# Patient Record
Sex: Male | Born: 1945 | Race: White | Hispanic: No | Marital: Married | Smoking: Never smoker
Health system: Southern US, Community
[De-identification: ages and names within clinical notes are randomized; demographics above are authoritative.]

## PROBLEM LIST (undated history)

## (undated) DIAGNOSIS — C221 Intrahepatic bile duct carcinoma: Secondary | ICD-10-CM

## (undated) DIAGNOSIS — I10 Essential (primary) hypertension: Secondary | ICD-10-CM

## (undated) DIAGNOSIS — E119 Type 2 diabetes mellitus without complications: Secondary | ICD-10-CM

## (undated) DIAGNOSIS — M1990 Unspecified osteoarthritis, unspecified site: Secondary | ICD-10-CM

## (undated) DIAGNOSIS — K219 Gastro-esophageal reflux disease without esophagitis: Secondary | ICD-10-CM

## (undated) DIAGNOSIS — E785 Hyperlipidemia, unspecified: Secondary | ICD-10-CM

## (undated) HISTORY — DX: Unspecified osteoarthritis, unspecified site: M19.90

## (undated) HISTORY — DX: Intrahepatic bile duct carcinoma: C22.1

## (undated) HISTORY — DX: Hyperlipidemia, unspecified: E78.5

## (undated) HISTORY — DX: Essential (primary) hypertension: I10

## (undated) HISTORY — DX: Gastro-esophageal reflux disease without esophagitis: K21.9

## (undated) HISTORY — DX: Type 2 diabetes mellitus without complications: E11.9

## (undated) HISTORY — PX: COLONOSCOPY: SHX174

---

## 1978-11-20 HISTORY — PX: APPENDECTOMY: SHX54

## 2009-07-12 ENCOUNTER — Ambulatory Visit: Payer: Self-pay | Admitting: Internal Medicine

## 2009-07-12 DIAGNOSIS — M109 Gout, unspecified: Secondary | ICD-10-CM

## 2009-07-12 DIAGNOSIS — M129 Arthropathy, unspecified: Secondary | ICD-10-CM | POA: Insufficient documentation

## 2009-07-12 DIAGNOSIS — I10 Essential (primary) hypertension: Secondary | ICD-10-CM

## 2009-07-12 DIAGNOSIS — E785 Hyperlipidemia, unspecified: Secondary | ICD-10-CM | POA: Insufficient documentation

## 2009-07-16 ENCOUNTER — Ambulatory Visit: Payer: Self-pay | Admitting: Internal Medicine

## 2016-04-19 ENCOUNTER — Encounter: Payer: Self-pay | Admitting: Internal Medicine

## 2017-02-12 DIAGNOSIS — E119 Type 2 diabetes mellitus without complications: Secondary | ICD-10-CM | POA: Diagnosis not present

## 2017-02-12 DIAGNOSIS — M109 Gout, unspecified: Secondary | ICD-10-CM | POA: Diagnosis not present

## 2017-02-12 DIAGNOSIS — E669 Obesity, unspecified: Secondary | ICD-10-CM | POA: Diagnosis not present

## 2017-02-12 DIAGNOSIS — Z87898 Personal history of other specified conditions: Secondary | ICD-10-CM | POA: Diagnosis not present

## 2017-02-12 DIAGNOSIS — E785 Hyperlipidemia, unspecified: Secondary | ICD-10-CM | POA: Diagnosis not present

## 2017-02-12 DIAGNOSIS — I10 Essential (primary) hypertension: Secondary | ICD-10-CM | POA: Diagnosis not present

## 2017-02-12 DIAGNOSIS — H6121 Impacted cerumen, right ear: Secondary | ICD-10-CM | POA: Diagnosis not present

## 2017-04-25 DIAGNOSIS — S80861A Insect bite (nonvenomous), right lower leg, initial encounter: Secondary | ICD-10-CM | POA: Diagnosis not present

## 2017-04-25 DIAGNOSIS — A932 Colorado tick fever: Secondary | ICD-10-CM | POA: Diagnosis not present

## 2017-06-19 DIAGNOSIS — E119 Type 2 diabetes mellitus without complications: Secondary | ICD-10-CM | POA: Diagnosis not present

## 2017-06-19 DIAGNOSIS — Z79899 Other long term (current) drug therapy: Secondary | ICD-10-CM | POA: Diagnosis not present

## 2017-06-19 DIAGNOSIS — E559 Vitamin D deficiency, unspecified: Secondary | ICD-10-CM | POA: Diagnosis not present

## 2017-06-19 DIAGNOSIS — I10 Essential (primary) hypertension: Secondary | ICD-10-CM | POA: Diagnosis not present

## 2017-06-19 DIAGNOSIS — M109 Gout, unspecified: Secondary | ICD-10-CM | POA: Diagnosis not present

## 2017-06-19 DIAGNOSIS — A770 Spotted fever due to Rickettsia rickettsii: Secondary | ICD-10-CM | POA: Diagnosis not present

## 2017-06-19 DIAGNOSIS — E785 Hyperlipidemia, unspecified: Secondary | ICD-10-CM | POA: Diagnosis not present

## 2017-07-03 DIAGNOSIS — I10 Essential (primary) hypertension: Secondary | ICD-10-CM | POA: Diagnosis not present

## 2017-09-05 DIAGNOSIS — H9319 Tinnitus, unspecified ear: Secondary | ICD-10-CM | POA: Diagnosis not present

## 2017-09-05 DIAGNOSIS — Z79899 Other long term (current) drug therapy: Secondary | ICD-10-CM | POA: Diagnosis not present

## 2017-09-05 DIAGNOSIS — Z23 Encounter for immunization: Secondary | ICD-10-CM | POA: Diagnosis not present

## 2017-09-05 DIAGNOSIS — E559 Vitamin D deficiency, unspecified: Secondary | ICD-10-CM | POA: Diagnosis not present

## 2017-09-18 DIAGNOSIS — E559 Vitamin D deficiency, unspecified: Secondary | ICD-10-CM | POA: Diagnosis not present

## 2017-09-18 DIAGNOSIS — H9319 Tinnitus, unspecified ear: Secondary | ICD-10-CM | POA: Diagnosis not present

## 2017-09-18 DIAGNOSIS — D485 Neoplasm of uncertain behavior of skin: Secondary | ICD-10-CM | POA: Diagnosis not present

## 2017-09-18 DIAGNOSIS — L859 Epidermal thickening, unspecified: Secondary | ICD-10-CM | POA: Diagnosis not present

## 2017-10-09 DIAGNOSIS — H903 Sensorineural hearing loss, bilateral: Secondary | ICD-10-CM | POA: Diagnosis not present

## 2017-10-09 DIAGNOSIS — H9311 Tinnitus, right ear: Secondary | ICD-10-CM | POA: Diagnosis not present

## 2017-10-09 DIAGNOSIS — H918X1 Other specified hearing loss, right ear: Secondary | ICD-10-CM | POA: Diagnosis not present

## 2017-10-15 DIAGNOSIS — H918X1 Other specified hearing loss, right ear: Secondary | ICD-10-CM | POA: Diagnosis not present

## 2017-10-15 DIAGNOSIS — H903 Sensorineural hearing loss, bilateral: Secondary | ICD-10-CM | POA: Diagnosis not present

## 2017-10-19 DIAGNOSIS — I7090 Unspecified atherosclerosis: Secondary | ICD-10-CM | POA: Diagnosis not present

## 2017-10-19 DIAGNOSIS — J329 Chronic sinusitis, unspecified: Secondary | ICD-10-CM | POA: Diagnosis not present

## 2017-10-19 DIAGNOSIS — H9311 Tinnitus, right ear: Secondary | ICD-10-CM | POA: Diagnosis not present

## 2017-10-19 DIAGNOSIS — H918X1 Other specified hearing loss, right ear: Secondary | ICD-10-CM | POA: Diagnosis not present

## 2017-10-26 DIAGNOSIS — Z9181 History of falling: Secondary | ICD-10-CM | POA: Diagnosis not present

## 2017-10-26 DIAGNOSIS — I10 Essential (primary) hypertension: Secondary | ICD-10-CM | POA: Diagnosis not present

## 2017-10-26 DIAGNOSIS — Z125 Encounter for screening for malignant neoplasm of prostate: Secondary | ICD-10-CM | POA: Diagnosis not present

## 2017-10-26 DIAGNOSIS — Z0000 Encounter for general adult medical examination without abnormal findings: Secondary | ICD-10-CM | POA: Diagnosis not present

## 2017-10-26 DIAGNOSIS — E785 Hyperlipidemia, unspecified: Secondary | ICD-10-CM | POA: Diagnosis not present

## 2017-10-26 DIAGNOSIS — M109 Gout, unspecified: Secondary | ICD-10-CM | POA: Diagnosis not present

## 2017-10-26 DIAGNOSIS — E559 Vitamin D deficiency, unspecified: Secondary | ICD-10-CM | POA: Diagnosis not present

## 2017-10-26 DIAGNOSIS — E669 Obesity, unspecified: Secondary | ICD-10-CM | POA: Diagnosis not present

## 2017-10-26 DIAGNOSIS — Z1211 Encounter for screening for malignant neoplasm of colon: Secondary | ICD-10-CM | POA: Diagnosis not present

## 2017-10-26 DIAGNOSIS — E119 Type 2 diabetes mellitus without complications: Secondary | ICD-10-CM | POA: Diagnosis not present

## 2017-10-26 DIAGNOSIS — Z1331 Encounter for screening for depression: Secondary | ICD-10-CM | POA: Diagnosis not present

## 2017-11-09 DIAGNOSIS — I10 Essential (primary) hypertension: Secondary | ICD-10-CM | POA: Diagnosis not present

## 2017-11-09 DIAGNOSIS — N182 Chronic kidney disease, stage 2 (mild): Secondary | ICD-10-CM | POA: Diagnosis not present

## 2017-11-09 DIAGNOSIS — E669 Obesity, unspecified: Secondary | ICD-10-CM | POA: Diagnosis not present

## 2017-11-09 DIAGNOSIS — E1122 Type 2 diabetes mellitus with diabetic chronic kidney disease: Secondary | ICD-10-CM | POA: Diagnosis not present

## 2017-11-09 DIAGNOSIS — E559 Vitamin D deficiency, unspecified: Secondary | ICD-10-CM | POA: Diagnosis not present

## 2017-11-09 DIAGNOSIS — E785 Hyperlipidemia, unspecified: Secondary | ICD-10-CM | POA: Diagnosis not present

## 2017-11-09 DIAGNOSIS — I129 Hypertensive chronic kidney disease with stage 1 through stage 4 chronic kidney disease, or unspecified chronic kidney disease: Secondary | ICD-10-CM | POA: Diagnosis not present

## 2017-11-09 DIAGNOSIS — M109 Gout, unspecified: Secondary | ICD-10-CM | POA: Diagnosis not present

## 2017-11-09 DIAGNOSIS — R972 Elevated prostate specific antigen [PSA]: Secondary | ICD-10-CM | POA: Diagnosis not present

## 2017-11-22 DIAGNOSIS — R972 Elevated prostate specific antigen [PSA]: Secondary | ICD-10-CM | POA: Diagnosis not present

## 2017-11-22 DIAGNOSIS — N401 Enlarged prostate with lower urinary tract symptoms: Secondary | ICD-10-CM | POA: Diagnosis not present

## 2017-11-22 DIAGNOSIS — Z79899 Other long term (current) drug therapy: Secondary | ICD-10-CM | POA: Diagnosis not present

## 2018-01-03 DIAGNOSIS — R972 Elevated prostate specific antigen [PSA]: Secondary | ICD-10-CM | POA: Diagnosis not present

## 2018-01-03 DIAGNOSIS — N401 Enlarged prostate with lower urinary tract symptoms: Secondary | ICD-10-CM | POA: Diagnosis not present

## 2018-02-07 DIAGNOSIS — E663 Overweight: Secondary | ICD-10-CM | POA: Diagnosis not present

## 2018-02-07 DIAGNOSIS — Z125 Encounter for screening for malignant neoplasm of prostate: Secondary | ICD-10-CM | POA: Diagnosis not present

## 2018-02-07 DIAGNOSIS — E785 Hyperlipidemia, unspecified: Secondary | ICD-10-CM | POA: Diagnosis not present

## 2018-02-07 DIAGNOSIS — I10 Essential (primary) hypertension: Secondary | ICD-10-CM | POA: Diagnosis not present

## 2018-02-07 DIAGNOSIS — Z1211 Encounter for screening for malignant neoplasm of colon: Secondary | ICD-10-CM | POA: Diagnosis not present

## 2018-02-07 DIAGNOSIS — Z0000 Encounter for general adult medical examination without abnormal findings: Secondary | ICD-10-CM | POA: Diagnosis not present

## 2018-02-07 DIAGNOSIS — E559 Vitamin D deficiency, unspecified: Secondary | ICD-10-CM | POA: Diagnosis not present

## 2018-02-07 DIAGNOSIS — E119 Type 2 diabetes mellitus without complications: Secondary | ICD-10-CM | POA: Diagnosis not present

## 2018-02-07 DIAGNOSIS — Z1331 Encounter for screening for depression: Secondary | ICD-10-CM | POA: Diagnosis not present

## 2018-02-21 DIAGNOSIS — E785 Hyperlipidemia, unspecified: Secondary | ICD-10-CM | POA: Diagnosis not present

## 2018-02-21 DIAGNOSIS — R972 Elevated prostate specific antigen [PSA]: Secondary | ICD-10-CM | POA: Diagnosis not present

## 2018-02-21 DIAGNOSIS — I129 Hypertensive chronic kidney disease with stage 1 through stage 4 chronic kidney disease, or unspecified chronic kidney disease: Secondary | ICD-10-CM | POA: Diagnosis not present

## 2018-02-21 DIAGNOSIS — E559 Vitamin D deficiency, unspecified: Secondary | ICD-10-CM | POA: Diagnosis not present

## 2018-02-21 DIAGNOSIS — M109 Gout, unspecified: Secondary | ICD-10-CM | POA: Diagnosis not present

## 2018-02-21 DIAGNOSIS — E669 Obesity, unspecified: Secondary | ICD-10-CM | POA: Diagnosis not present

## 2018-02-21 DIAGNOSIS — I10 Essential (primary) hypertension: Secondary | ICD-10-CM | POA: Diagnosis not present

## 2018-02-21 DIAGNOSIS — N182 Chronic kidney disease, stage 2 (mild): Secondary | ICD-10-CM | POA: Diagnosis not present

## 2018-02-21 DIAGNOSIS — E1122 Type 2 diabetes mellitus with diabetic chronic kidney disease: Secondary | ICD-10-CM | POA: Diagnosis not present

## 2018-04-02 DIAGNOSIS — N401 Enlarged prostate with lower urinary tract symptoms: Secondary | ICD-10-CM | POA: Diagnosis not present

## 2018-04-02 DIAGNOSIS — R972 Elevated prostate specific antigen [PSA]: Secondary | ICD-10-CM | POA: Diagnosis not present

## 2018-05-30 DIAGNOSIS — Z1339 Encounter for screening examination for other mental health and behavioral disorders: Secondary | ICD-10-CM | POA: Diagnosis not present

## 2018-05-30 DIAGNOSIS — M109 Gout, unspecified: Secondary | ICD-10-CM | POA: Diagnosis not present

## 2018-05-30 DIAGNOSIS — E559 Vitamin D deficiency, unspecified: Secondary | ICD-10-CM | POA: Diagnosis not present

## 2018-05-30 DIAGNOSIS — E785 Hyperlipidemia, unspecified: Secondary | ICD-10-CM | POA: Diagnosis not present

## 2018-05-30 DIAGNOSIS — I10 Essential (primary) hypertension: Secondary | ICD-10-CM | POA: Diagnosis not present

## 2018-05-30 DIAGNOSIS — E1122 Type 2 diabetes mellitus with diabetic chronic kidney disease: Secondary | ICD-10-CM | POA: Diagnosis not present

## 2018-05-30 DIAGNOSIS — R972 Elevated prostate specific antigen [PSA]: Secondary | ICD-10-CM | POA: Diagnosis not present

## 2018-06-26 DIAGNOSIS — H524 Presbyopia: Secondary | ICD-10-CM | POA: Diagnosis not present

## 2018-06-26 DIAGNOSIS — H40023 Open angle with borderline findings, high risk, bilateral: Secondary | ICD-10-CM | POA: Diagnosis not present

## 2018-07-03 DIAGNOSIS — R972 Elevated prostate specific antigen [PSA]: Secondary | ICD-10-CM | POA: Diagnosis not present

## 2018-07-03 DIAGNOSIS — N401 Enlarged prostate with lower urinary tract symptoms: Secondary | ICD-10-CM | POA: Diagnosis not present

## 2018-09-05 DIAGNOSIS — Z139 Encounter for screening, unspecified: Secondary | ICD-10-CM | POA: Diagnosis not present

## 2018-09-05 DIAGNOSIS — E559 Vitamin D deficiency, unspecified: Secondary | ICD-10-CM | POA: Diagnosis not present

## 2018-09-05 DIAGNOSIS — Z23 Encounter for immunization: Secondary | ICD-10-CM | POA: Diagnosis not present

## 2018-09-05 DIAGNOSIS — E785 Hyperlipidemia, unspecified: Secondary | ICD-10-CM | POA: Diagnosis not present

## 2018-09-05 DIAGNOSIS — M109 Gout, unspecified: Secondary | ICD-10-CM | POA: Diagnosis not present

## 2018-09-05 DIAGNOSIS — R972 Elevated prostate specific antigen [PSA]: Secondary | ICD-10-CM | POA: Diagnosis not present

## 2018-09-05 DIAGNOSIS — I10 Essential (primary) hypertension: Secondary | ICD-10-CM | POA: Diagnosis not present

## 2018-09-05 DIAGNOSIS — E1122 Type 2 diabetes mellitus with diabetic chronic kidney disease: Secondary | ICD-10-CM | POA: Diagnosis not present

## 2018-09-29 DIAGNOSIS — S81852A Open bite, left lower leg, initial encounter: Secondary | ICD-10-CM | POA: Diagnosis not present

## 2018-10-07 DIAGNOSIS — N401 Enlarged prostate with lower urinary tract symptoms: Secondary | ICD-10-CM | POA: Diagnosis not present

## 2018-10-07 DIAGNOSIS — R972 Elevated prostate specific antigen [PSA]: Secondary | ICD-10-CM | POA: Diagnosis not present

## 2018-10-23 DIAGNOSIS — N401 Enlarged prostate with lower urinary tract symptoms: Secondary | ICD-10-CM | POA: Diagnosis not present

## 2018-10-23 DIAGNOSIS — R972 Elevated prostate specific antigen [PSA]: Secondary | ICD-10-CM | POA: Diagnosis not present

## 2018-10-24 DIAGNOSIS — D075 Carcinoma in situ of prostate: Secondary | ICD-10-CM | POA: Diagnosis not present

## 2018-10-30 DIAGNOSIS — N401 Enlarged prostate with lower urinary tract symptoms: Secondary | ICD-10-CM | POA: Diagnosis not present

## 2018-10-30 DIAGNOSIS — R972 Elevated prostate specific antigen [PSA]: Secondary | ICD-10-CM | POA: Diagnosis not present

## 2018-12-05 DIAGNOSIS — Z0000 Encounter for general adult medical examination without abnormal findings: Secondary | ICD-10-CM | POA: Diagnosis not present

## 2018-12-05 DIAGNOSIS — E785 Hyperlipidemia, unspecified: Secondary | ICD-10-CM | POA: Diagnosis not present

## 2018-12-05 DIAGNOSIS — Z136 Encounter for screening for cardiovascular disorders: Secondary | ICD-10-CM | POA: Diagnosis not present

## 2018-12-05 DIAGNOSIS — Z1331 Encounter for screening for depression: Secondary | ICD-10-CM | POA: Diagnosis not present

## 2018-12-05 DIAGNOSIS — E669 Obesity, unspecified: Secondary | ICD-10-CM | POA: Diagnosis not present

## 2018-12-05 DIAGNOSIS — Z125 Encounter for screening for malignant neoplasm of prostate: Secondary | ICD-10-CM | POA: Diagnosis not present

## 2018-12-05 DIAGNOSIS — Z9181 History of falling: Secondary | ICD-10-CM | POA: Diagnosis not present

## 2018-12-05 DIAGNOSIS — Z6835 Body mass index (BMI) 35.0-35.9, adult: Secondary | ICD-10-CM | POA: Diagnosis not present

## 2018-12-13 DIAGNOSIS — M1712 Unilateral primary osteoarthritis, left knee: Secondary | ICD-10-CM | POA: Diagnosis not present

## 2019-01-09 DIAGNOSIS — R972 Elevated prostate specific antigen [PSA]: Secondary | ICD-10-CM | POA: Diagnosis not present

## 2019-01-09 DIAGNOSIS — E559 Vitamin D deficiency, unspecified: Secondary | ICD-10-CM | POA: Diagnosis not present

## 2019-01-09 DIAGNOSIS — E785 Hyperlipidemia, unspecified: Secondary | ICD-10-CM | POA: Diagnosis not present

## 2019-01-09 DIAGNOSIS — H9319 Tinnitus, unspecified ear: Secondary | ICD-10-CM | POA: Diagnosis not present

## 2019-01-09 DIAGNOSIS — I10 Essential (primary) hypertension: Secondary | ICD-10-CM | POA: Diagnosis not present

## 2019-01-09 DIAGNOSIS — E1122 Type 2 diabetes mellitus with diabetic chronic kidney disease: Secondary | ICD-10-CM | POA: Diagnosis not present

## 2019-01-09 DIAGNOSIS — M109 Gout, unspecified: Secondary | ICD-10-CM | POA: Diagnosis not present

## 2019-01-29 DIAGNOSIS — N401 Enlarged prostate with lower urinary tract symptoms: Secondary | ICD-10-CM | POA: Diagnosis not present

## 2019-01-29 DIAGNOSIS — R972 Elevated prostate specific antigen [PSA]: Secondary | ICD-10-CM | POA: Diagnosis not present

## 2019-04-28 NOTE — Progress Notes (Signed)
Prescreened pt for 6-9 visit.  Brandon Sherman

## 2019-04-29 ENCOUNTER — Other Ambulatory Visit: Payer: Self-pay

## 2019-04-29 ENCOUNTER — Encounter: Payer: Self-pay | Admitting: Gastroenterology

## 2019-04-29 ENCOUNTER — Other Ambulatory Visit: Payer: Self-pay | Admitting: Gastroenterology

## 2019-04-29 ENCOUNTER — Ambulatory Visit (INDEPENDENT_AMBULATORY_CARE_PROVIDER_SITE_OTHER): Payer: Medicare HMO | Admitting: Gastroenterology

## 2019-04-29 VITALS — Ht 71.0 in | Wt 235.0 lb

## 2019-04-29 DIAGNOSIS — R1013 Epigastric pain: Secondary | ICD-10-CM

## 2019-04-29 DIAGNOSIS — K219 Gastro-esophageal reflux disease without esophagitis: Secondary | ICD-10-CM | POA: Diagnosis not present

## 2019-04-29 DIAGNOSIS — Z1211 Encounter for screening for malignant neoplasm of colon: Secondary | ICD-10-CM

## 2019-04-29 MED ORDER — FAMOTIDINE 20 MG PO TABS: 20.0000 mg | ORAL_TABLET | Freq: Two times a day (BID) | ORAL | 5 refills | Status: DC

## 2019-04-29 NOTE — Progress Notes (Signed)
THIS ENCOUNTER IS A VIRTUAL VISIT DUE TO COVID-19 - PATIENT WAS NOT SEEN IN THE OFFICE. PATIENT HAS CONSENTED TO VIRTUAL VISIT / TELEMEDICINE VISIT. PATIENT REQUESTED TELEPHONE VISIT ONLY, DID NOT HAVE ACCESS TO VISUAL CAPABILITY   Location of patient: home Location of provider: office Name of referring provider: Ihor Dow Persons participating: myself, patient  HPI :  73 y/o male with a history of DM, HLD, HTN, GERD,  referred for GERD and abdominal pain  He reports a history of hiatal hernia, unclear how this was diagnosed. He has some regurgitation of food after he eats and some upper abdominal discomfort after he eats. No dysphagia at all. He denies much pyrosis. He has a burning sensation in his epigastric area usually after he eats. He thinks symptoms ongoing for 2 months which have bothered him significantly. He has symptoms periodically, some days bother him, others don't. Symptoms of reflux / regurgitation in general have bothered him for several years. He was nexium in the past, remotely, can't recall how much it has helped. He doesn't take much currently.  He has taken Maalox PRN OTC, which can helps when he takes it. He's never had a prior endoscopy. No FH of esophageal / gastric / colon cancer.  No tobacco use. No history of kidney disease. No weight loss.  No trouble with bowel habits, no trouble with constipation or diarrhea. NO blood in the stools. Last colonoscopy about 10  Years ago.  Colonoscopy 07/16/09 - normal exam, no polyps, good prep - Dr. Olevia Perches   Past Medical History:  Diagnosis Date  . Diabetes ([place])   . Hyperlipidemia   . Hypertension      Past Surgical History:  Procedure Laterality Date  . APPENDECTOMY  1980   Family History  Problem Relation Age of Onset  . Heart attack Mother   . Stroke Father   . Colon cancer Neg Hx   . Esophageal cancer Neg Hx   . Stomach cancer Neg Hx    Social History   Tobacco Use  . Smoking status: Never Smoker  .  Smokeless tobacco: Current User    Types: Chew  Substance Use Topics  . Alcohol use: Not Currently    Comment: occasionally  . Drug use: Never   Current Outpatient Medications  Medication Sig Dispense Refill  . allopurinol (ZYLOPRIM) 100 MG tablet Take 100 mg by mouth daily.    Marland Kitchen amLODipine (NORVASC) 2.5 MG tablet Take 2.5 mg by mouth daily.    . Ergocalciferol (VITAMIN D2 PO) Take 50,000 Units by mouth once a week.    . fenofibrate (TRICOR) 145 MG tablet Take 145 mg by mouth daily.    . finasteride (PROSCAR) 5 MG tablet Take 5 mg by mouth daily.    Marland Kitchen lisinopril (ZESTRIL) 40 MG tablet Take 40 mg by mouth daily.    . metFORMIN (GLUCOPHAGE) 500 MG tablet Take by mouth 2 (two) times daily with a meal.    . pravastatin (PRAVACHOL) 40 MG tablet Take 40 mg by mouth daily.    . tamsulosin (FLOMAX) 0.4 MG CAPS capsule Take 0.4 mg by mouth daily.     No current facility-administered medications for this visit.    Not on File   Review of Systems: All systems reviewed and negative except where noted in HPI.    No results found. No labs on file  Physical Exam: Ht 5\' 11"  (1.803 m) Comment: pt provided over the phone  Wt 235 lb (106.6 kg)  Comment: pt provided over the phone  BMI 32.78 kg/m  NA   ASSESSMENT AND PLAN: 73 y/o male here for new patient assessment of the following issues:  GERD / epigastric pain - longstanding intermittent symptoms, now bothering him more frequently recently. Discussed options with him. Recommend starting maintenance regimen for reflux, discussed PPIs versus H2 blockers, risks / benefits. Will try pepcid 20mg  BID to start given better safety profile, he will use this for 4 weeks. If symptoms are not controlled on the regimen will then try PPI. Otherwise, given longstanding symptoms and his age I offered him an EGD for BE screening. I discussed risks / benefits of endoscopy and anesthesia with him, he wanted to proceed. Further recommendations pending the  results.  Colon cancer screening - asymptomatic without alarm symptoms, due for routine colon cancer screening this summer. We discussed options. He wanted to proceed with optical colonoscopy at same time as EGD. Will refer to scheduler, further recommendations pending the results.   [place] Cellar, MD [facility]  CC: Ihor Dow

## 2019-04-29 NOTE — Patient Instructions (Addendum)
If you are age 71 or older, your body mass index should be between 23-30. Your Body mass index is 32.78 kg/m. If this is out of the aforementioned range listed, please consider follow up with your Primary Care Provider.  If you are age 60 or younger, your body mass index should be between 19-25. Your Body mass index is 32.78 kg/m. If this is out of the aformentioned range listed, please consider follow up with your Primary Care Provider.   To help prevent the possible spread of infection to our patients, communities, and staff; we will be implementing the following measures:  As of now we are not allowing any visitors/family members to accompany you to any upcoming appointments with [facility]. If you have any concerns about this please contact our office to discuss prior to the appointment.   We have sent the following medications to your pharmacy for you to pick up at your convenience: Pepcid 20mg : Take twice a day   You have been scheduled for an endoscopy and colonoscopy on 06-03-2019 at 1:30pm. Please follow the written instructions given to you at your nurse telephone previsit on 05-21-19 at 11:00am.  Please pick up your prep supplies at the pharmacy within the next 1-3 days. If you use inhalers (even only as needed), please bring them with you on the day of your procedure. Your physician has requested that you go to www.startemmi.com and enter the access code given to you at your visit today. This web site gives a general overview about your procedure. However, you should still follow specific instructions given to you by our office regarding your preparation for the procedure.  Thank you for entrusting me with your care and for choosing [facility], Dr. [place] Cellar

## 2019-05-01 DIAGNOSIS — R972 Elevated prostate specific antigen [PSA]: Secondary | ICD-10-CM | POA: Diagnosis not present

## 2019-05-01 DIAGNOSIS — N401 Enlarged prostate with lower urinary tract symptoms: Secondary | ICD-10-CM | POA: Diagnosis not present

## 2019-05-02 ENCOUNTER — Encounter: Payer: Self-pay | Admitting: Gastroenterology

## 2019-05-06 DIAGNOSIS — E785 Hyperlipidemia, unspecified: Secondary | ICD-10-CM | POA: Diagnosis not present

## 2019-05-06 DIAGNOSIS — Z87891 Personal history of nicotine dependence: Secondary | ICD-10-CM | POA: Diagnosis not present

## 2019-05-06 DIAGNOSIS — E1122 Type 2 diabetes mellitus with diabetic chronic kidney disease: Secondary | ICD-10-CM | POA: Diagnosis not present

## 2019-05-06 DIAGNOSIS — I129 Hypertensive chronic kidney disease with stage 1 through stage 4 chronic kidney disease, or unspecified chronic kidney disease: Secondary | ICD-10-CM | POA: Diagnosis not present

## 2019-05-06 DIAGNOSIS — E559 Vitamin D deficiency, unspecified: Secondary | ICD-10-CM | POA: Diagnosis not present

## 2019-05-06 DIAGNOSIS — I10 Essential (primary) hypertension: Secondary | ICD-10-CM | POA: Diagnosis not present

## 2019-05-06 DIAGNOSIS — R972 Elevated prostate specific antigen [PSA]: Secondary | ICD-10-CM | POA: Diagnosis not present

## 2019-05-06 DIAGNOSIS — M109 Gout, unspecified: Secondary | ICD-10-CM | POA: Diagnosis not present

## 2019-05-06 DIAGNOSIS — N182 Chronic kidney disease, stage 2 (mild): Secondary | ICD-10-CM | POA: Diagnosis not present

## 2019-05-19 ENCOUNTER — Telehealth: Payer: Self-pay | Admitting: Gastroenterology

## 2019-05-19 NOTE — Telephone Encounter (Signed)
Pt wife called in wanting to inform the nurse that the patient stomach is getting worse. He has a schedule procedure on 06/03/2019 but she is needing some advice now. Please call and advise.

## 2019-05-19 NOTE — Telephone Encounter (Signed)
Spoke to patient and he states he has been having a lot of stomach burning and some nights he has been vomiting his supper after he goes to bed. He goes to bed about 5 hours after supper. He sleeps elevated. States the Pepcid-20mg  BID he has been taking is not helping at all. Please advise

## 2019-05-20 ENCOUNTER — Other Ambulatory Visit: Payer: Self-pay

## 2019-05-20 MED ORDER — OMEPRAZOLE 40 MG PO CPDR: 40.0000 mg | DELAYED_RELEASE_CAPSULE | Freq: Two times a day (BID) | ORAL | 1 refills | Status: DC

## 2019-05-20 NOTE — Telephone Encounter (Signed)
Called patient and gave instructions on taking Omeprazole and sent to patient's Pharmacy.

## 2019-05-20 NOTE — Telephone Encounter (Signed)
Thanks for the message, sorry the Pepcid has not helped. Recommend trying omeprazole 40mg  twice daily for the next month to get control of symptoms and then can dose reduce to once daily or PRN therafter, if you can help order it for him. Will await EGD on 7/14. Thanks

## 2019-05-21 ENCOUNTER — Ambulatory Visit (AMBULATORY_SURGERY_CENTER): Payer: Self-pay

## 2019-05-21 ENCOUNTER — Encounter: Payer: Self-pay | Admitting: Gastroenterology

## 2019-05-21 ENCOUNTER — Other Ambulatory Visit: Payer: Self-pay

## 2019-05-21 VITALS — Ht 71.0 in | Wt 218.0 lb

## 2019-05-21 DIAGNOSIS — K219 Gastro-esophageal reflux disease without esophagitis: Secondary | ICD-10-CM

## 2019-05-21 DIAGNOSIS — Z1211 Encounter for screening for malignant neoplasm of colon: Secondary | ICD-10-CM

## 2019-05-21 MED ORDER — NA SULFATE-K SULFATE-MG SULF 17.5-3.13-1.6 GM/177ML PO SOLN: 1.0000 | Freq: Once | ORAL | 0 refills | Status: AC

## 2019-05-21 NOTE — Progress Notes (Signed)
Denies allergies to eggs or soy products. Denies complication of anesthesia or sedation. Denies use of weight loss medication. Denies use of O2.   Emmi instructions given for colonoscopy.  Pre-Visit was conducted by phone due to Covid 19. Instructions were reviewed and mailed to patient at his confirmed home address. Patient was encouraged to call if he had any questions regarding instructions.

## 2019-06-02 ENCOUNTER — Telehealth: Payer: Self-pay | Admitting: Gastroenterology

## 2019-06-02 NOTE — Telephone Encounter (Signed)
Patient called and answer no to all questions. °

## 2019-06-02 NOTE — Telephone Encounter (Signed)

## 2019-06-03 ENCOUNTER — Encounter: Payer: Self-pay | Admitting: Gastroenterology

## 2019-06-03 ENCOUNTER — Encounter: Payer: Medicare HMO | Admitting: Gastroenterology

## 2019-06-03 ENCOUNTER — Ambulatory Visit (AMBULATORY_SURGERY_CENTER): Payer: Medicare HMO | Admitting: Gastroenterology

## 2019-06-03 ENCOUNTER — Other Ambulatory Visit: Payer: Self-pay

## 2019-06-03 VITALS — BP 109/66 | HR 56 | Temp 99.1°F | Resp 10 | Ht 71.0 in | Wt 218.0 lb

## 2019-06-03 DIAGNOSIS — Z1211 Encounter for screening for malignant neoplasm of colon: Secondary | ICD-10-CM

## 2019-06-03 DIAGNOSIS — D122 Benign neoplasm of ascending colon: Secondary | ICD-10-CM

## 2019-06-03 DIAGNOSIS — D123 Benign neoplasm of transverse colon: Secondary | ICD-10-CM

## 2019-06-03 DIAGNOSIS — K2970 Gastritis, unspecified, without bleeding: Secondary | ICD-10-CM

## 2019-06-03 DIAGNOSIS — K219 Gastro-esophageal reflux disease without esophagitis: Secondary | ICD-10-CM | POA: Diagnosis not present

## 2019-06-03 DIAGNOSIS — B3781 Candidal esophagitis: Secondary | ICD-10-CM

## 2019-06-03 DIAGNOSIS — K2950 Unspecified chronic gastritis without bleeding: Secondary | ICD-10-CM | POA: Diagnosis not present

## 2019-06-03 DIAGNOSIS — K2900 Acute gastritis without bleeding: Secondary | ICD-10-CM | POA: Diagnosis not present

## 2019-06-03 MED ORDER — FLUCONAZOLE 100 MG PO TABS: ORAL_TABLET | ORAL | 0 refills | Status: DC

## 2019-06-03 MED ORDER — SODIUM CHLORIDE 0.9 % IV SOLN: 500.0000 mL | Freq: Once | INTRAVENOUS | Status: DC

## 2019-06-03 NOTE — Progress Notes (Signed)
A/ox3, pleased with MAC, report to RN 

## 2019-06-03 NOTE — Progress Notes (Signed)
Called to room to assist during endoscopic procedure.  Patient ID and intended procedure confirmed with present staff. Received instructions for my participation in the procedure from the performing physician.  

## 2019-06-03 NOTE — Patient Instructions (Signed)
Information on hemorrhoids and polyps given to you today.   Await pathology results.  Start Fluconazole 400mg  by mouth one time, then 200mg  daily for 13 days.  YOU HAD AN ENDOSCOPIC PROCEDURE TODAY AT [facility]:   Refer to the procedure report that was given to you for any specific questions about what was found during the examination.  If the procedure report does not answer your questions, please call your gastroenterologist to clarify.  If you requested that your care partner not be given the details of your procedure findings, then the procedure report has been included in a sealed envelope for you to review at your convenience later.  YOU SHOULD EXPECT: Some feelings of bloating in the abdomen. Passage of more gas than usual.  Walking can help get rid of the air that was put into your GI tract during the procedure and reduce the bloating. If you had a lower endoscopy (such as a colonoscopy or flexible sigmoidoscopy) you may notice spotting of blood in your stool or on the toilet paper. If you underwent a bowel prep for your procedure, you may not have a normal bowel movement for a few days.  Please Note:  You might notice some irritation and congestion in your nose or some drainage.  This is from the oxygen used during your procedure.  There is no need for concern and it should clear up in a day or so.  SYMPTOMS TO REPORT IMMEDIATELY:   Following lower endoscopy (colonoscopy or flexible sigmoidoscopy):  Excessive amounts of blood in the stool  Significant tenderness or worsening of abdominal pains  Swelling of the abdomen that is new, acute  Fever of 100F or higher   Following upper endoscopy (EGD)  Vomiting of blood or coffee ground material  New chest pain or pain under the shoulder blades  Painful or persistently difficult swallowing  New shortness of breath  Fever of 100F or higher  Black, tarry-looking stools  For urgent or emergent issues, a  gastroenterologist can be reached at any hour by calling [phone].   DIET:  We do recommend a small meal at first, but then you may proceed to your regular diet.  Drink plenty of fluids but you should avoid alcoholic beverages for 24 hours.  ACTIVITY:  You should plan to take it easy for the rest of today and you should NOT DRIVE or use heavy machinery until tomorrow (because of the sedation medicines used during the test).    FOLLOW UP: Our staff will call the number listed on your records 48-72 hours following your procedure to check on you and address any questions or concerns that you may have regarding the information given to you following your procedure. If we do not reach you, we will leave a message.  We will attempt to reach you two times.  During this call, we will ask if you have developed any symptoms of COVID 19. If you develop any symptoms (ie: fever, flu-like symptoms, shortness of breath, cough etc.) before then, please call [phone].  If you test positive for Covid 19 in the 2 weeks post procedure, please call and report this information to [place].    If any biopsies were taken you will be contacted by phone or by letter within the next 1-3 weeks.  Please call us at [phone] if you have not heard about the biopsies in 3 weeks.    SIGNATURES/CONFIDENTIALITY: You and/or your care partner have signed paperwork which will  be entered into your electronic medical record.  These signatures attest to the fact that that the information above on your After Visit Summary has been reviewed and is understood.  Full responsibility of the confidentiality of this discharge information lies with you and/or your care-partner.

## 2019-06-03 NOTE — Op Note (Signed)
[facility] Patient Name: Brandon Sherman Procedure Date: 06/03/2019 1:48 PM MRN: [id number] Endoscopist: Remo Lipps P. Havery Moros , MD Age: 73 Referring MD:  Date of Birth: 14-Aug-1946 Gender: Male Account #: [id number] Procedure:                Upper GI endoscopy Indications:              Screening for Barrett's esophagus, history of GERD                            now controlled with omeprazole daily Medicines:                Monitored Anesthesia Care Procedure:                Pre-Anesthesia Assessment:                           - Prior to the procedure, a History and Physical                            was performed, and patient medications and                            allergies were reviewed. The patient's tolerance of                            previous anesthesia was also reviewed. The risks                            and benefits of the procedure and the sedation                            options and risks were discussed with the patient.                            All questions were answered, and informed consent                            was obtained. Prior Anticoagulants: The patient has                            taken no previous anticoagulant or antiplatelet                            agents. ASA Grade Assessment: II - A patient with                            mild systemic disease. After reviewing the risks                            and benefits, the patient was deemed in                            satisfactory condition to undergo the procedure.  After obtaining informed consent, the endoscope was                            passed under direct vision. Throughout the                            procedure, the patient's blood pressure, pulse, and                            oxygen saturations were monitored continuously. The                            Endoscope was introduced through the mouth, and                            advanced to  the second part of duodenum. The upper                            GI endoscopy was accomplished without difficulty.                            The patient tolerated the procedure well. Scope In: Scope Out: Findings:                 Esophagogastric landmarks were identified: the                            Z-line was found at 40 cm, the gastroesophageal                            junction was found at 40 cm and the upper extent of                            the gastric folds was found at 40 cm from the                            incisors.                           Patchy, white plaques were found in the lower third                            of the esophagus grossly consistent with esophageal                            candidiasis.                           The exam of the esophagus was otherwise normal. No                            evidence of Barrett's                           Patchy mucosal changes characterized  by                            discoloration, erythema, inflammation, nodularity /                            polypoid changes (in the body) and altered texture                            were found in the entire examined stomach in patchy                            distribution. Biopsies were taken with a cold                            forceps for histology.                           The exam of the stomach was otherwise normal.                           The duodenal bulb and second portion of the                            duodenum were normal. Complications:            No immediate complications. Estimated blood loss:                            Minimal. Estimated Blood Loss:     Estimated blood loss was minimal. Impression:               - Esophagogastric landmarks identified.                           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                           - No evidence of Barrett's                           -  Altered patchy mucosa in the stomach as outlined,                            biopsied extensively.                           - Normal duodenal bulb and second portion of the                            duodenum. Recommendation:           - Patient has a contact number available for  emergencies. The signs and symptoms of potential                            delayed complications were discussed with the                            patient. Return to normal activities tomorrow.                            Written discharge instructions were provided to the                            patient.                           - Resume previous diet.                           - Continue present medications.                           - If no contraindications start fluconazole 400mg                             PO x 1, then 200mg  / day for another 13 days to                            treat candidiasis                           - Await pathology results. Remo Lipps P. [name], MD 06/03/2019 2:32:06 PM This report has been signed electronically.

## 2019-06-03 NOTE — Op Note (Signed)
[place] Patient Name: Brandon Sherman Procedure Date: 06/03/2019 1:47 PM MRN: [id number] Endoscopist: Remo Lipps P. Havery Moros , MD Age: 73 Referring MD:  Date of Birth: Oct 08, 1946 Gender: Male Account #: [id number] Procedure:                Colonoscopy Indications:              Screening for colorectal malignant neoplasm Medicines:                Monitored Anesthesia Care Procedure:                Pre-Anesthesia Assessment:                           - Prior to the procedure, a History and Physical                            was performed, and patient medications and                            allergies were reviewed. The patient's tolerance of                            previous anesthesia was also reviewed. The risks                            and benefits of the procedure and the sedation                            options and risks were discussed with the patient.                            All questions were answered, and informed consent                            was obtained. Prior Anticoagulants: The patient has                            taken no previous anticoagulant or antiplatelet                            agents. ASA Grade Assessment: II - A patient with                            mild systemic disease. After reviewing the risks                            and benefits, the patient was deemed in                            satisfactory condition to undergo the procedure.                           After obtaining informed consent, the colonoscope  was passed under direct vision. Throughout the                            procedure, the patient's blood pressure, pulse, and                            oxygen saturations were monitored continuously. The                            Colonoscope was introduced through the anus and                            advanced to the the cecum, identified by                            appendiceal orifice  and ileocecal valve. The                            colonoscopy was performed without difficulty. The                            patient tolerated the procedure well. The quality                            of the bowel preparation was good. The ileocecal                            valve, appendiceal orifice, and rectum were                            photographed. Scope In: 2:00:44 PM Scope Out: 2:22:50 PM Scope Withdrawal Time: 0 hours 17 minutes 7 seconds  Total Procedure Duration: 0 hours 22 minutes 6 seconds  Findings:                 The perianal and digital rectal examinations were                            normal.                           Two sessile polyps were found in the ascending                            colon. The polyps were 3 to 5 mm in size. These                            polyps were removed with a cold snare. Resection                            and retrieval were complete.                           A 4 mm polyp was found in the transverse colon. The  polyp was sessile. The polyp was removed with a                            cold snare. Resection and retrieval were complete.                           Internal hemorrhoids were found during                            retroflexion. The hemorrhoids were moderate.                           The exam was otherwise without abnormality. Complications:            No immediate complications. Estimated blood loss:                            Minimal. Estimated Blood Loss:     Estimated blood loss was minimal. Impression:               - Two 3 to 5 mm polyps in the ascending colon,                            removed with a cold snare. Resected and retrieved.                           - One 4 mm polyp in the transverse colon, removed                            with a cold snare. Resected and retrieved.                           - Internal hemorrhoids.                           - The examination was  otherwise normal. Recommendation:           - Patient has a contact number available for                            emergencies. The signs and symptoms of potential                            delayed complications were discussed with the                            patient. Return to normal activities tomorrow.                            Written discharge instructions were provided to the                            patient.                           - Resume previous diet.                           -  Continue present medications.                           - Await pathology results. Remo Lipps P. Havery Moros, MD 06/03/2019 2:26:53 PM This report has been signed electronically.

## 2019-06-03 NOTE — Progress Notes (Signed)
Pt's states no medical or surgical changes since previsit or office visit. 

## 2019-06-05 ENCOUNTER — Telehealth: Payer: Self-pay

## 2019-06-05 ENCOUNTER — Telehealth: Payer: Self-pay | Admitting: *Deleted

## 2019-06-05 NOTE — Telephone Encounter (Signed)
  Follow up Call-  Call back number 06/03/2019  Post procedure Call Back phone  # [phone]  Permission to leave phone message Yes  Some recent data might be hidden     Patient questions:  Message left to call us if necessary.  Second call.

## 2019-06-05 NOTE — Telephone Encounter (Signed)
Attempted to reach pt. With follow-up call following endoscopic procedure 06/03/2019.  Unable to LM.  No ans. Machine.  Will try to reach pt. Again later today.

## 2019-06-11 ENCOUNTER — Other Ambulatory Visit: Payer: Self-pay | Admitting: Gastroenterology

## 2019-06-24 ENCOUNTER — Telehealth: Payer: Self-pay | Admitting: Gastroenterology

## 2019-06-24 NOTE — Telephone Encounter (Signed)
Sherlynn Stalls he should have a cbc, CMet and lipase drawn time ensure normal if he has not had basic labs done recently, can you ask him to go to the lab. He should be on omeprazole twice daily. Can you see if one of the APPs can see him this week to examine him and see if he needs imaging, I'm out of the office all week this week. Thanks

## 2019-06-24 NOTE — Telephone Encounter (Signed)
Patient called with c/o right sided pain that radiates to his back. It has been going on for a couple of days and is a constant dull pain. It is not related to when he eats and he has been nauseated. No emesis, BM changes, no fever. States the Omeprazole has been helping with his reflux/stomach pain.

## 2019-06-25 ENCOUNTER — Other Ambulatory Visit: Payer: Self-pay

## 2019-06-25 DIAGNOSIS — K219 Gastro-esophageal reflux disease without esophagitis: Secondary | ICD-10-CM

## 2019-06-25 NOTE — Telephone Encounter (Signed)
CBC, CMET and Lipase orders in Epic and patient says he will come into our lab tomorrow to have them drawn. Patient said he isn't hurting right now, and that his abdominal pain usually is worse at night. States he is already taking Omeprazole twice a day. He does not eat close to going to bed and is trying to watch what he eats. There are no open o.v. with any APPs this week, and the first opening for Dr. Havery Moros or an APP is 07/16/19 at 9:30am with Tye Savoy NP.Patient scheduled for this

## 2019-06-26 ENCOUNTER — Other Ambulatory Visit (INDEPENDENT_AMBULATORY_CARE_PROVIDER_SITE_OTHER): Payer: Medicare HMO

## 2019-06-26 DIAGNOSIS — K219 Gastro-esophageal reflux disease without esophagitis: Secondary | ICD-10-CM | POA: Diagnosis not present

## 2019-06-26 LAB — COMPREHENSIVE METABOLIC PANEL
ALT: 16 U/L (ref 0–53)
AST: 16 U/L (ref 0–37)
Albumin: 3.1 g/dL — ABNORMAL LOW (ref 3.5–5.2)
Alkaline Phosphatase: 28 U/L — ABNORMAL LOW (ref 39–117)
BUN: 14 mg/dL (ref 6–23)
CO2: 23 mEq/L (ref 19–32)
Calcium: 8.6 mg/dL (ref 8.4–10.5)
Chloride: 109 mEq/L (ref 96–112)
Creatinine, Ser: 0.88 mg/dL (ref 0.40–1.50)
GFR: 85.28 mL/min (ref >60.00)
Glucose, Bld: 106 mg/dL — ABNORMAL HIGH (ref 70–99)
Potassium: 3.6 mEq/L (ref 3.5–5.1)
Sodium: 140 mEq/L (ref 135–145)
Total Bilirubin: 0.3 mg/dL (ref 0.2–1.2)
Total Protein: 4.7 g/dL — ABNORMAL LOW (ref 6.0–8.3)

## 2019-06-26 LAB — CBC WITH DIFFERENTIAL/PLATELET
Basophils Absolute: 0.2 10*3/uL — ABNORMAL HIGH (ref 0.0–0.1)
Basophils Relative: 2.4 % (ref 0.0–3.0)
Eosinophils Absolute: 1 10*3/uL — ABNORMAL HIGH (ref 0.0–0.7)
Eosinophils Relative: 10.1 % — ABNORMAL HIGH (ref 0.0–5.0)
HCT: 43.7 % (ref 39.0–52.0)
Hemoglobin: 14.4 g/dL (ref 13.0–17.0)
Lymphocytes Relative: 23.8 % (ref 12.0–46.0)
Lymphs Abs: 2.4 10*3/uL (ref 0.7–4.0)
MCHC: 32.9 g/dL (ref 30.0–36.0)
MCV: 92 fl (ref 78.0–100.0)
Monocytes Absolute: 0.6 10*3/uL (ref 0.1–1.0)
Monocytes Relative: 5.4 % (ref 3.0–12.0)
Neutro Abs: 6 10*3/uL (ref 1.4–7.7)
Neutrophils Relative %: 58.3 % (ref 43.0–77.0)
Platelets: 286 10*3/uL (ref 150.0–400.0)
RBC: 4.75 Mil/uL (ref 4.22–5.81)
RDW: 13.5 % (ref 11.5–15.5)
WBC: 10.3 10*3/uL (ref 4.0–10.5)

## 2019-06-26 LAB — LIPASE: Lipase: 40 U/L (ref 11.0–59.0)

## 2019-07-07 ENCOUNTER — Telehealth: Payer: Self-pay

## 2019-07-07 ENCOUNTER — Telehealth: Payer: Self-pay | Admitting: Gastroenterology

## 2019-07-07 NOTE — Telephone Encounter (Signed)
-----   Message from Hughie Closs, RN sent at 06/12/2019 10:34 AM EDT ----- Schedule In Person office visit in Sept. For: discuss EGD and assess mucosal healing on PPI

## 2019-07-07 NOTE — Telephone Encounter (Signed)
Called patient to schedule F/U offfice visit with Dr. Havery Moros in Sept., but patient has been feeling worse so a visit had already been set-up with Alonza Bogus PA on 07/16/19 at 9:30am

## 2019-07-07 NOTE — Telephone Encounter (Signed)
See phone note

## 2019-07-08 ENCOUNTER — Telehealth: Payer: Self-pay | Admitting: Gastroenterology

## 2019-07-08 NOTE — Telephone Encounter (Signed)
Old labs arrived  09/05/2017 -   CBC - Hgb 14.9, HCT 43.0, plt 243, WBC 7.2 - 1% eosinophils  Patient has follow up next week to discuss

## 2019-07-16 ENCOUNTER — Telehealth: Payer: Self-pay | Admitting: Gastroenterology

## 2019-07-16 ENCOUNTER — Encounter: Payer: Self-pay | Admitting: Gastroenterology

## 2019-07-16 ENCOUNTER — Ambulatory Visit: Payer: Medicare HMO | Admitting: Gastroenterology

## 2019-07-16 VITALS — BP 126/78 | HR 82 | Temp 97.4°F | Ht 69.0 in | Wt 216.4 lb

## 2019-07-16 DIAGNOSIS — R1013 Epigastric pain: Secondary | ICD-10-CM | POA: Diagnosis not present

## 2019-07-16 DIAGNOSIS — R634 Abnormal weight loss: Secondary | ICD-10-CM | POA: Insufficient documentation

## 2019-07-16 DIAGNOSIS — R112 Nausea with vomiting, unspecified: Secondary | ICD-10-CM

## 2019-07-16 DIAGNOSIS — R109 Unspecified abdominal pain: Secondary | ICD-10-CM

## 2019-07-16 MED ORDER — PANTOPRAZOLE SODIUM 40 MG PO TBEC: 40.0000 mg | DELAYED_RELEASE_TABLET | Freq: Two times a day (BID) | ORAL | 2 refills | Status: DC

## 2019-07-16 MED ORDER — ONDANSETRON HCL 4 MG PO TABS: 4.0000 mg | ORAL_TABLET | Freq: Four times a day (QID) | ORAL | 1 refills | Status: DC | PRN

## 2019-07-16 NOTE — Telephone Encounter (Signed)
The prescription for zofran is too expensive Brandon Sherman. Can we prescribe an alternative?

## 2019-07-16 NOTE — Progress Notes (Signed)
07/16/2019 Brandon Sherman [id number] 1946-08-04   HISTORY OF PRESENT ILLNESS:  This is a 73 year old male who is a patient of Dr. Havery Moros.  He presents here today with complaints of epigastric abdominal pain, generalized abdominal discomfort, nausea and intermittent vomiting, and 35 pound weight loss.  Recently had EGD and colonoscopy.  EGD revealed esophageal candidiasis and patchy gastritis.  He completed Diflucan and has continued on PPI now at omeprazole 40 mg twice daily without much improvement in his symptoms.  Says that the weight loss has been occurring since all of these symptoms started, about 6 months ago.  Diffuse abdominal pain is described as burning.  Vomits after eating different foods, definitely cannot eat spicy foods, etc but also can happen with other stuff such as crackers, etc.     Past Medical History:  Diagnosis Date  . Arthritis   . Diabetes ([place])   . GERD (gastroesophageal reflux disease)   . Hyperlipidemia   . Hypertension    Past Surgical History:  Procedure Laterality Date  . APPENDECTOMY  1980    reports that he has never smoked. His smokeless tobacco use includes chew. He reports previous alcohol use. He reports that he does not use drugs. family history includes Heart attack in his mother; Stroke in his father. No Known Allergies    Outpatient Encounter Medications as of 07/16/2019  Medication Sig  . allopurinol (ZYLOPRIM) 100 MG tablet Take 100 mg by mouth daily.  Marland Kitchen amLODipine (NORVASC) 2.5 MG tablet Take 2.5 mg by mouth daily.  . Ergocalciferol (VITAMIN D2 PO) Take 50,000 Units by mouth once a week.  . fenofibrate (TRICOR) 145 MG tablet Take 145 mg by mouth daily.  . finasteride (PROSCAR) 5 MG tablet Take 5 mg by mouth daily.  . fluconazole (DIFLUCAN) 100 MG tablet Take 400mg  po x 1 day, then 200mg  po daily x 13 days.  Marland Kitchen lisinopril (ZESTRIL) 40 MG tablet Take 40 mg by mouth daily.  . metFORMIN (GLUCOPHAGE) 500 MG tablet Take by mouth 2  (two) times daily with a meal.  . omeprazole (PRILOSEC) 40 MG capsule Take 1 capsule (40 mg total) by mouth daily.  . pravastatin (PRAVACHOL) 40 MG tablet Take 40 mg by mouth daily.  . tamsulosin (FLOMAX) 0.4 MG CAPS capsule Take 0.4 mg by mouth daily.  . famotidine (PEPCID) 40 MG tablet Take 0.5 tablets (20 mg total) by mouth 2 (two) times daily. (Patient not taking: Reported on 06/03/2019)   No facility-administered encounter medications on file as of 07/16/2019.      REVIEW OF SYSTEMS  : All other systems reviewed and negative except where noted in the History of Present Illness.   PHYSICAL EXAM: BP 126/78 (BP Location: Left Arm, Patient Position: Sitting, Cuff Size: Normal)   Pulse 82   Temp (!) 97.4 F (36.3 C) (Other (Comment))   Ht 5\' 9"  (1.753 m)   Wt 216 lb 6 oz (98.1 kg)   BMI 31.95 kg/m  General: Well developed white male in no acute distress Head: Normocephalic and atraumatic Eyes:  Sclerae anicteric, conjunctiva pink. Ears: Normal auditory acuity Lungs: Clear throughout to auscultation; no increased WOB. Heart: Regular rate and rhythm; no M/R/G. Abdomen: Soft, non-distended.  BS present.  Non-tender. Musculoskeletal: Symmetrical with no gross deformities  Skin: No lesions on visible extremities Extremities: No edema  Neurological: Alert oriented x 4, grossly non-focal Psychological:  Alert and cooperative. Normal mood and affect  ASSESSMENT AND PLAN: *74 year old male with complaints  of epigastric abdominal pain, generalized abdominal discomfort, nausea and intermittent vomiting, and 35 pound weight loss: Recently had EGD and colonoscopy.  EGD revealed esophageal candidiasis and patchy gastritis.  He completed Diflucan and has continued on PPI now at omeprazole 40 mg twice daily without much improvement in his symptoms.  Plan was to repeat EGD in about 8 weeks to confirm mucosal healing and reassess.  We will plan for that and schedule with Dr. Havery Moros.  I am also  going to schedule him for a CT scan of the abdomen pelvis with contrast.  I am going to change his PPI to pantoprazole 40 mg twice daily to see if the change in medication would help and give any additional relief.  Other option would be to add Carafate to the regimen.  Will give Zofran to use as needed for nausea as well.  **The risks, benefits, and alternatives to EGD were discussed with the patient and he consents to proceed.   CC:  [name], PA

## 2019-07-16 NOTE — Patient Instructions (Signed)
Discontinue omeprazole.   We have sent the following medications to your pharmacy for you to pick up at your convenience: pantoprazole and zofran.  You have been scheduled for a CT scan of the abdomen and pelvis at [facility] ([address]---this is in the same building as [facility]).   You are scheduled on 07/18/19 at 1:30pm. You should arrive 15 minutes prior to your appointment time for registration. Please follow the written instructions below on the day of your exam:  WARNING: IF YOU ARE ALLERGIC TO IODINE/X-RAY DYE, PLEASE NOTIFY RADIOLOGY IMMEDIATELY AT [phone]! YOU WILL BE GIVEN A 13 HOUR PREMEDICATION PREP.  1) Do not eat or drink anything after 9:30am (4 hours prior to your test) 2) You have been given 2 bottles of oral contrast to drink. The solution may taste better if refrigerated, but do NOT add ice or any other liquid to this solution. Shake well before drinking.    Drink 1 bottle of contrast @ 11:30am (2 hours prior to your exam)  Drink 1 bottle of contrast @ 12:30pm (1 hour prior to your exam)  You may take any medications as prescribed with a small amount of water, if necessary. If you take any of the following medications: METFORMIN, GLUCOPHAGE, GLUCOVANCE, AVANDAMET, RIOMET, FORTAMET, [place] MET, JANUMET, GLUMETZA or METAGLIP, you MAY be asked to HOLD this medication 48 hours AFTER the exam.  The purpose of you drinking the oral contrast is to aid in the visualization of your intestinal tract. The contrast solution may cause some diarrhea. Depending on your individual set of symptoms, you may also receive an intravenous injection of x-ray contrast/dye. Plan on being at [facility] for 30 minutes or longer, depending on the type of exam you are having performed.  This test typically takes 30-45 minutes to complete.  If you have any questions regarding your exam or if you need to reschedule, you may call the CT department at [phone]  between the hours of 8:00 am and 5:00 pm, Monday-Friday.  __________________________________________________________________  Dennis Bast have been scheduled for an endoscopy. Please follow written instructions given to you at your visit today. If you use inhalers (even only as needed), please bring them with you on the day of your procedure.

## 2019-07-17 MED ORDER — PROMETHAZINE HCL 12.5 MG PO TABS: 12.5000 mg | ORAL_TABLET | Freq: Four times a day (QID) | ORAL | 1 refills | Status: DC | PRN

## 2019-07-17 NOTE — Telephone Encounter (Signed)
Was that for the ODT or regular?  ODT not usually covered well so if it was the ODT then let's try for the regular.  If it was the regular then we can try phenergan, but that can make him sleepy.  Would try 12.5 mg every 6-8 hours prn for now if they want to do this.  #20 refill 1

## 2019-07-17 NOTE — Progress Notes (Signed)
Agree with assessment and plan as outlined. Sorry to hear he is not feeling better. I agree with repeat EGD - the mucosa of his stomach was very unusual in appearance, I was concerned about some sort of infiltrative process but nothing was seen on the pathology. I will repeat biopsies of his stomach and see if high dose PPI has made any difference to the gross appearance of it. Otherwise, agree with CT given his progressive weight loss. Further recommendations pending the results.

## 2019-07-17 NOTE — Telephone Encounter (Signed)
Zofran was sent as regular and not ODT. I offered patient prescription for phenergan in the place of Zofran. Patient states he doesn't really get nauseated that often but we can send it in just in case he needs it. Phenergan sent to the pharmacy.

## 2019-07-17 NOTE — Telephone Encounter (Signed)
PA with [employer] done on cover my meds for phenergan was approved until 11/20/2019.

## 2019-07-18 ENCOUNTER — Other Ambulatory Visit: Payer: Self-pay

## 2019-07-18 ENCOUNTER — Ambulatory Visit (INDEPENDENT_AMBULATORY_CARE_PROVIDER_SITE_OTHER)
Admission: RE | Admit: 2019-07-18 | Discharge: 2019-07-18 | Disposition: A | Discharge status: Home / Self Care | Payer: Medicare HMO | Source: Ambulatory Visit | Attending: Gastroenterology | Admitting: Gastroenterology

## 2019-07-18 DIAGNOSIS — R112 Nausea with vomiting, unspecified: Secondary | ICD-10-CM | POA: Diagnosis not present

## 2019-07-18 DIAGNOSIS — R1110 Vomiting, unspecified: Secondary | ICD-10-CM | POA: Diagnosis not present

## 2019-07-18 DIAGNOSIS — R634 Abnormal weight loss: Secondary | ICD-10-CM | POA: Diagnosis not present

## 2019-07-18 DIAGNOSIS — R109 Unspecified abdominal pain: Secondary | ICD-10-CM

## 2019-07-18 DIAGNOSIS — R1013 Epigastric pain: Secondary | ICD-10-CM

## 2019-07-18 IMAGING — CT CT ABDOMEN AND PELVIS WITH CONTRAST
2 of 5 series · 15 of 46 positions shown, 17 images · IV contrast (OMNIPAQUE 300)
Comparison: None.

CLINICAL DATA: Epigastric pain and burning sensation for 3-4
months. Nausea and vomiting. Gastritis on recent endoscopy.

EXAM:
CT ABDOMEN AND PELVIS WITH CONTRAST
TECHNIQUE: Multidetector CT imaging of the abdomen and pelvis was performed
using the standard protocol following bolus administration of
intravenous contrast.
CONTRAST:  100mL OMNIPAQUE IOHEXOL 300 MG/ML  SOLN

[Series 2: abd/pel w · axial · 0.83mm/px · z∈[-524,-99]mm · 12 of 96 slices shown, 14 images]
[im 6/96  soft-tissue]
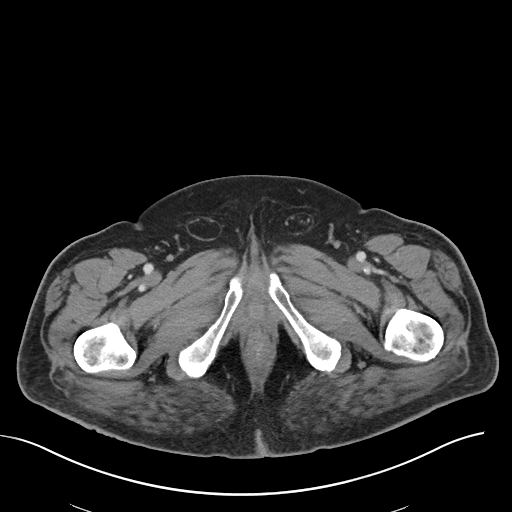
[im 6/96  bone]
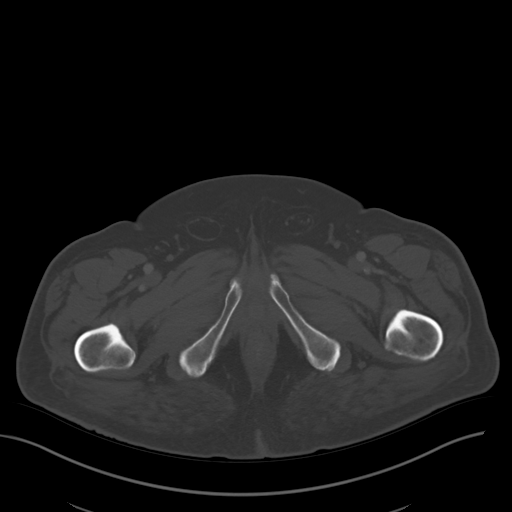
[im 16/96  soft-tissue]
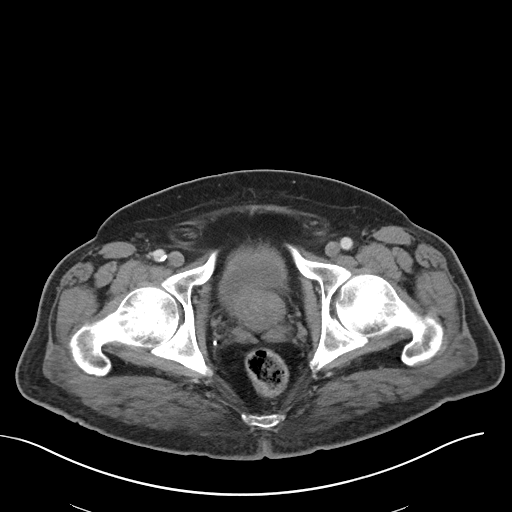
[im 21/96  soft-tissue]
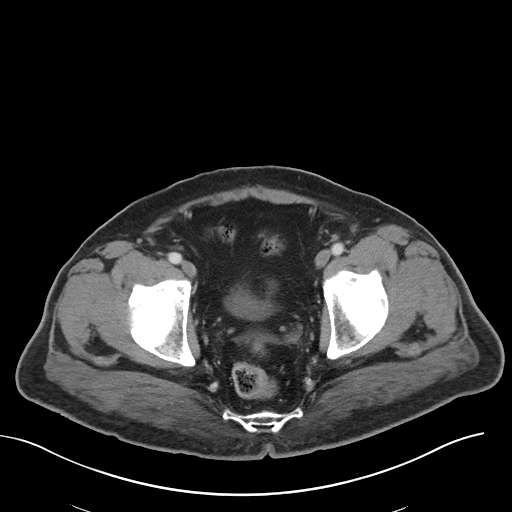
[im 31/96  soft-tissue]
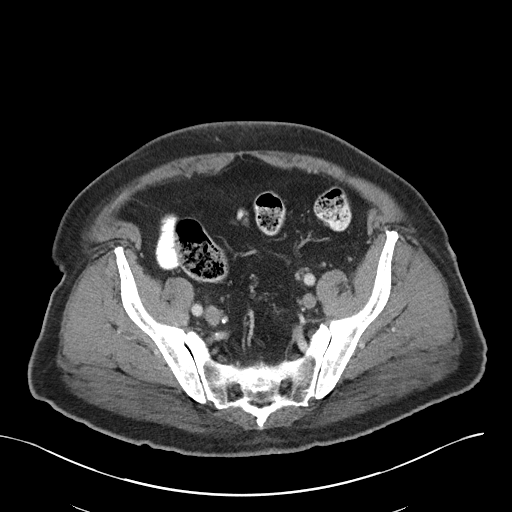
[im 36/96  soft-tissue]
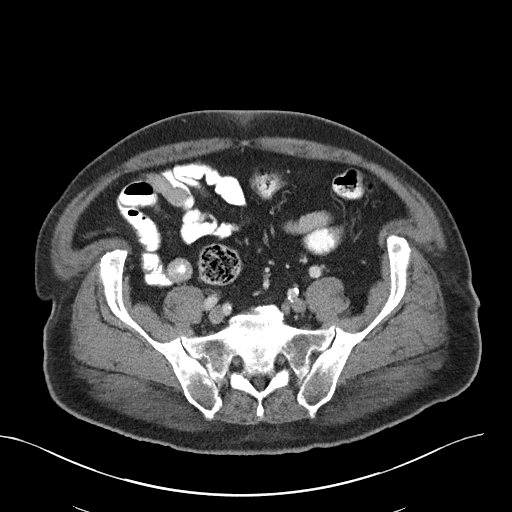
[im 46/96  soft-tissue]
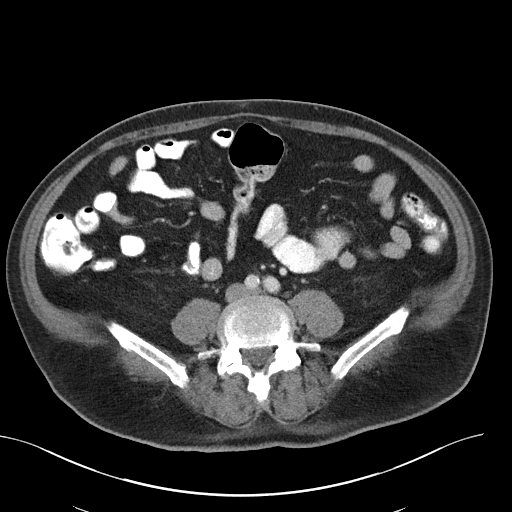
[im 51/96  soft-tissue]
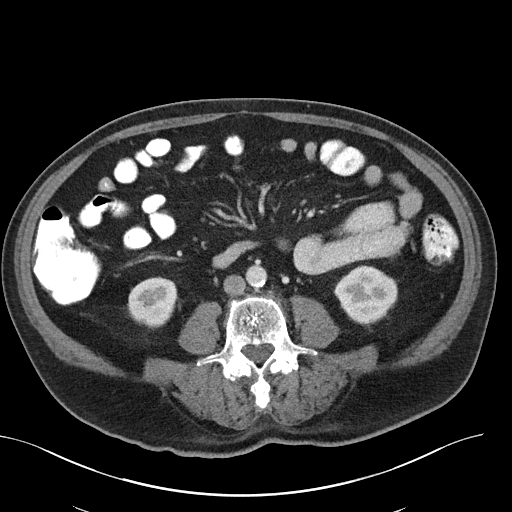
[im 61/96  soft-tissue]
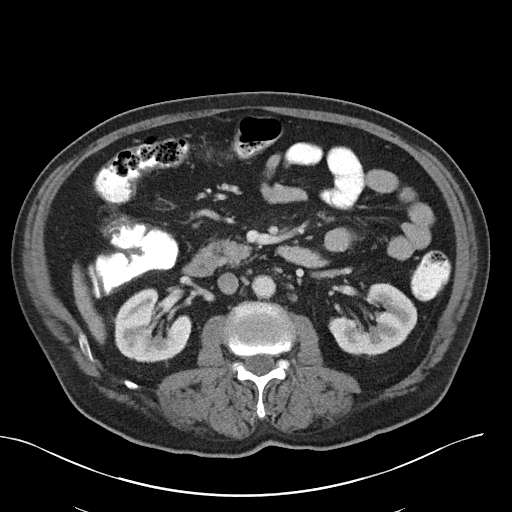
[im 66/96  soft-tissue]
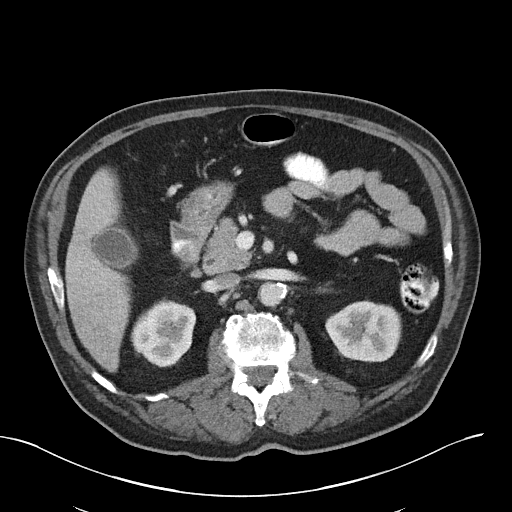
[im 66/96  bone]
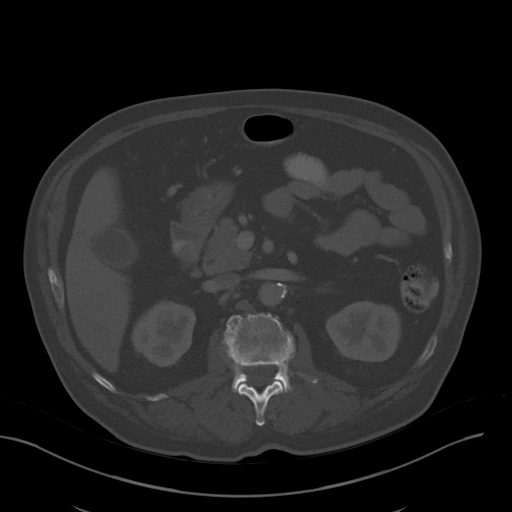
[im 76/96  soft-tissue]
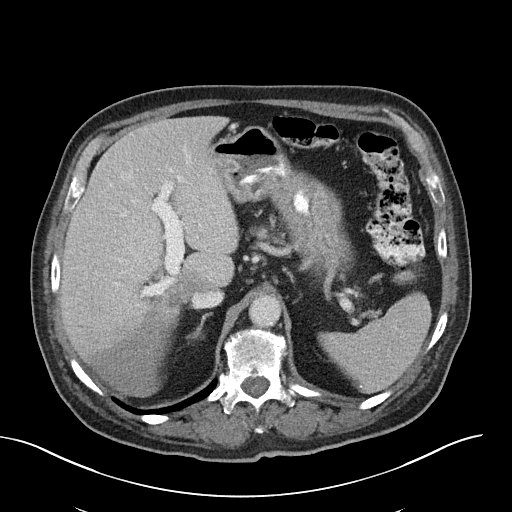
[im 81/96  soft-tissue]
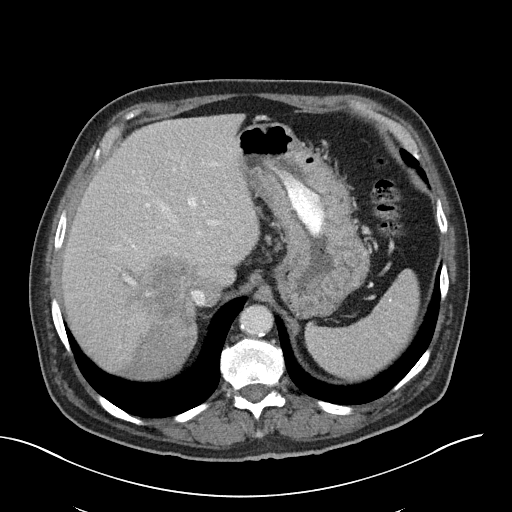
[im 91/96  soft-tissue]
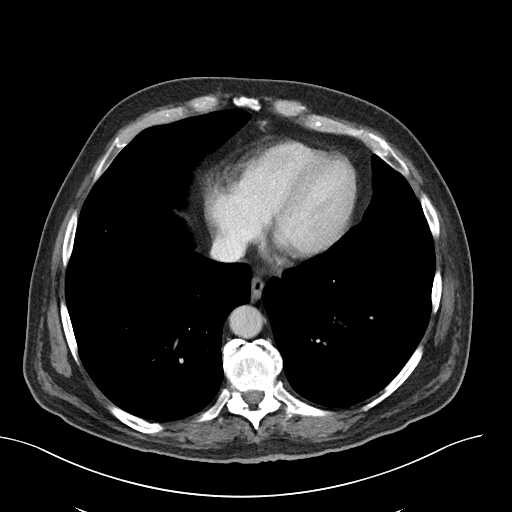

[Series 6: abd/pel w st · coronal · 0.76mm/px · 3 of 99 slices shown]
[im 33/99  soft-tissue]
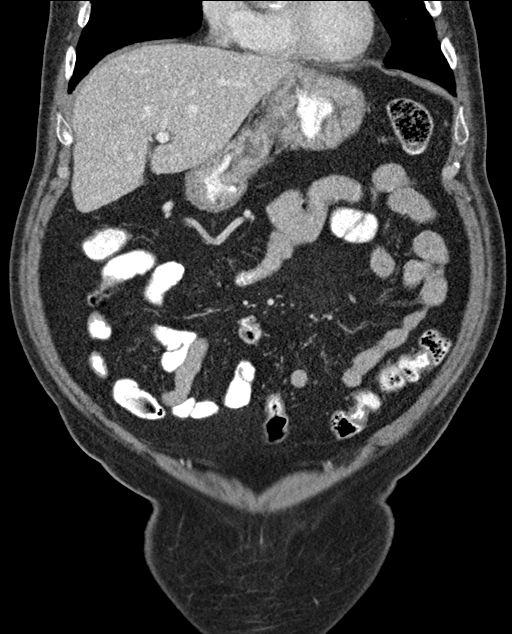
[im 44/99  soft-tissue]
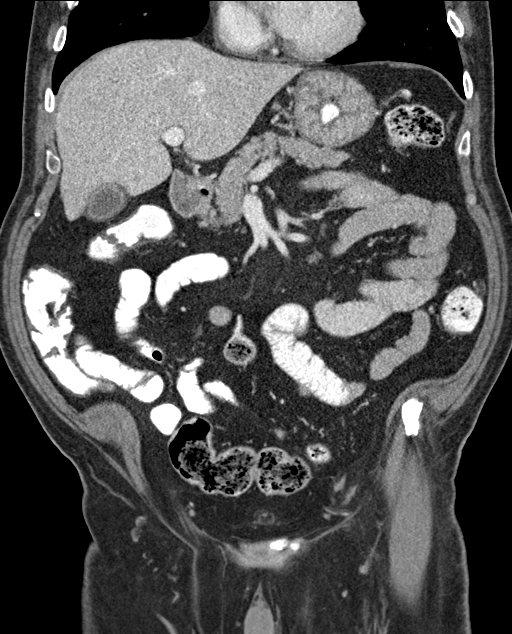
[im 55/99  soft-tissue]
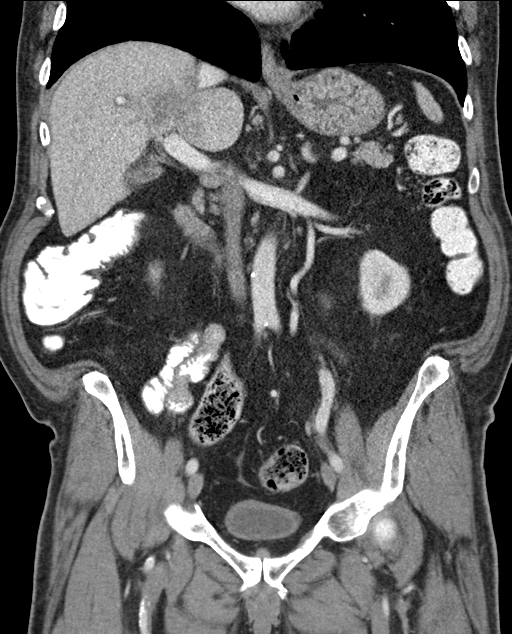

[15 of 46 positions shown; findings below may reference images not displayed]

FINDINGS: Lower chest: 4 mm right middle lobe pulmonary nodule on image [DATE].
Descending thoracic aortic atherosclerotic vascular disease.

Hepatobiliary: Abnormal hypodensity suspicious for potential mass
medially in the right hepatic lobe and potentially extending into
the caudate lobe, measuring approximately 6.2 by 5.6 by 5.4 cm, and
with triangular regions of hypoenhancement posteriorly in the right
hepatic lobe probably from truncation of portions of the right
portal vein and/or right hepatic vein.

1.0 cm gallstone in the gallbladder. Mild gallbladder wall
thickening circumferentially, nonspecific. No appreciable biliary
dilatation.

Pancreas: Unremarkable

Spleen: Unremarkable

Adrenals/Urinary Tract: 1.1 by 0.9 by 0.9 cm hypodense lesion in the
left kidney upper pole is statistically likely to be a benign cyst
but technically too small to characterize. There is a similar 0.9 by
0.7 cm lesion in the right kidney upper pole, image [DATE].

Stomach/Bowel: Unremarkable

Vascular/Lymphatic: Aortoiliac atherosclerotic vascular disease.

Reproductive: Prostate gland measures 5.4 by 4.7 by 5.3 cm (volume =
70 cm^3).

Other: No supplemental non-categorized findings.

Musculoskeletal: Lower lumbar spondylosis and degenerative disc
disease causing impingement at L3-4 and L4-5.
IMPRESSION: 1. Heterogeneous hypodensity posteriorly in the right hepatic lobe
and potentially extending into the caudate lobe suspicious for a
mass. There is felt to be truncation of branches of the portal vein
in this vicinity and some narrowing of the hepatic vein, as well as
triangular-shaped regions of abnormal hypoenhancement posteriorly in
the right hepatic lobe likely representing downstream vascular
effects. Cannot exclude malignancy such as cholangiocarcinoma or
hepatocellular carcinoma, and follow up hepatic protocol MRI with
and without contrast is recommended to further characterize.
2. 4 mm right middle lobe pulmonary nodule is likely benign but may
merit surveillance.
3. Cholelithiasis.
4.  Aortic Atherosclerosis ([T9]-[T9]).
5. Prostatomegaly.
6. Mild impingement at L3-4 and L4-5.

## 2019-07-18 MED ORDER — IOHEXOL 300 MG/ML  SOLN
100.0000 mL | Freq: Once | INTRAMUSCULAR | Status: AC | PRN
  Administered 2019-07-18: 14:00:00 100 mL via INTRAVENOUS

## 2019-07-22 ENCOUNTER — Other Ambulatory Visit: Payer: Self-pay

## 2019-07-22 DIAGNOSIS — K769 Liver disease, unspecified: Secondary | ICD-10-CM

## 2019-07-30 ENCOUNTER — Encounter: Payer: Self-pay | Admitting: Gastroenterology

## 2019-07-31 ENCOUNTER — Ambulatory Visit (HOSPITAL_COMMUNITY)
Admission: RE | Admit: 2019-07-31 | Discharge: 2019-07-31 | Disposition: A | Discharge status: Home / Self Care | Payer: Medicare HMO | Source: Ambulatory Visit | Attending: Gastroenterology | Admitting: Gastroenterology

## 2019-07-31 ENCOUNTER — Other Ambulatory Visit: Payer: Self-pay

## 2019-07-31 DIAGNOSIS — K769 Liver disease, unspecified: Secondary | ICD-10-CM | POA: Diagnosis not present

## 2019-07-31 DIAGNOSIS — K7689 Other specified diseases of liver: Secondary | ICD-10-CM | POA: Diagnosis not present

## 2019-07-31 DIAGNOSIS — K8020 Calculus of gallbladder without cholecystitis without obstruction: Secondary | ICD-10-CM | POA: Diagnosis not present

## 2019-07-31 IMAGING — MR MR ABDOMEN WO/W CM
11 of 19 series · 23 of 48 positions shown · IV contrast (gadavist)
Comparison: CT abdomen [DATE]

CLINICAL DATA: Right hepatic lobe lesion for further
characterization.

EXAM:
MRI ABDOMEN WITHOUT AND WITH CONTRAST
TECHNIQUE: Multiplanar multisequence MR imaging of the abdomen was performed
both before and after the administration of intravenous contrast.
CONTRAST:  10mL GADAVIST GADOBUTROL 1 MMOL/ML IV SOLN

[Series 3: T2 fat-sat · axial · 5.0mm · 0.78mm/px · 1 of 51 slices shown]
[im 1/51]
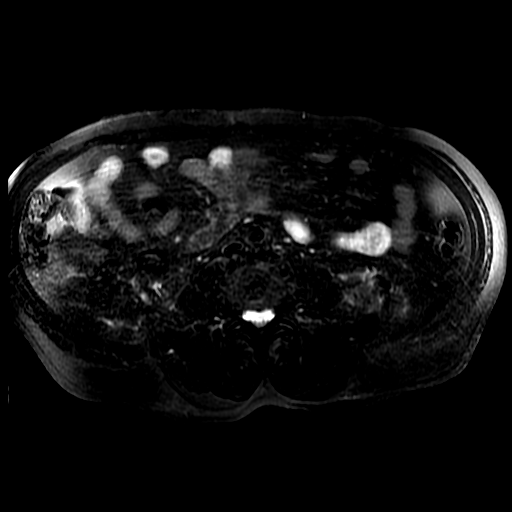

[Series 4: DWI b500 · axial · 6.0mm · 1.48mm/px · z∈[+9,+196]mm · 2 of 50 slices shown]
[im 1/50]
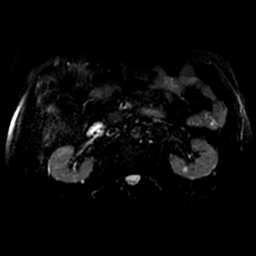
[im 50/50]
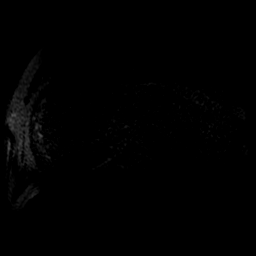

[Series 5: T2 · axial · 5.0mm · 0.78mm/px · z∈[-67,+193]mm · 2 of 53 slices shown (1 of 2)]
[im 1/53]
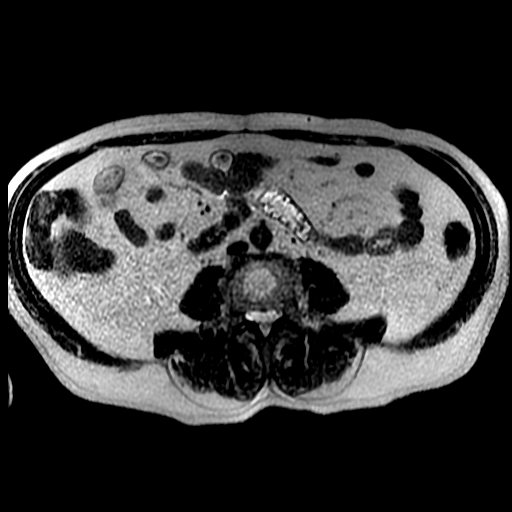
[im 53/53]
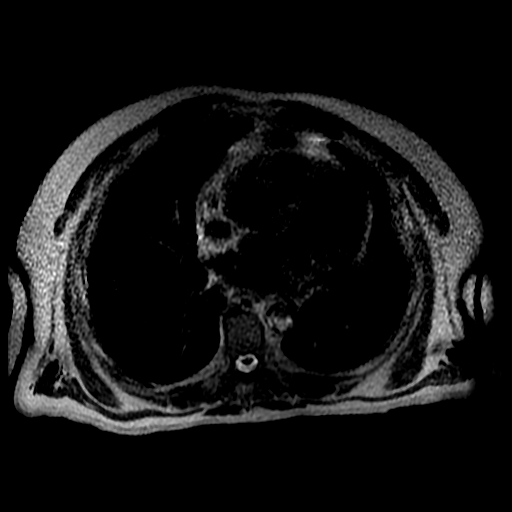

[Series 6: T2 · coronal · 5.0mm · 0.78mm/px · 2 of 57 slices shown (2 of 2)]
[im 1/57]
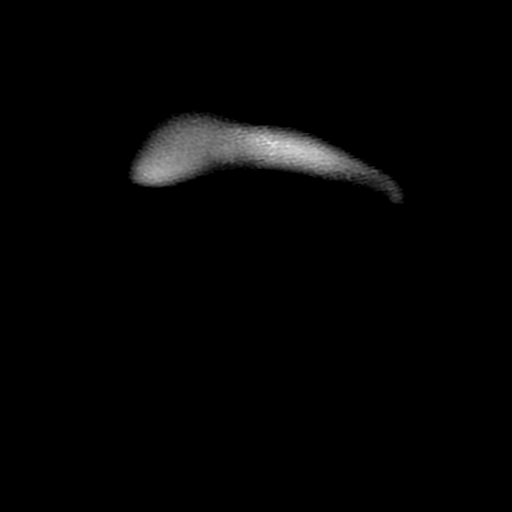
[im 57/57]
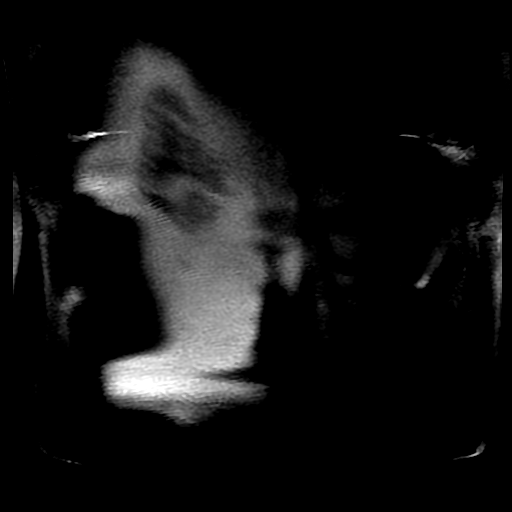

[Series 7: bSSFP · axial · 5.0mm · 0.78mm/px · z∈[-67,+193]mm · 2 of 53 slices shown]
[im 1/53]
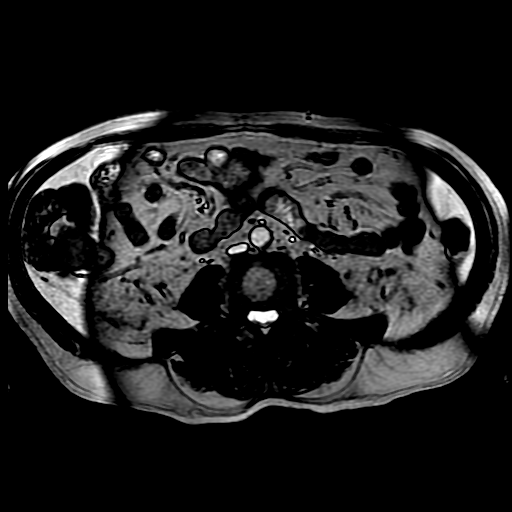
[im 53/53]
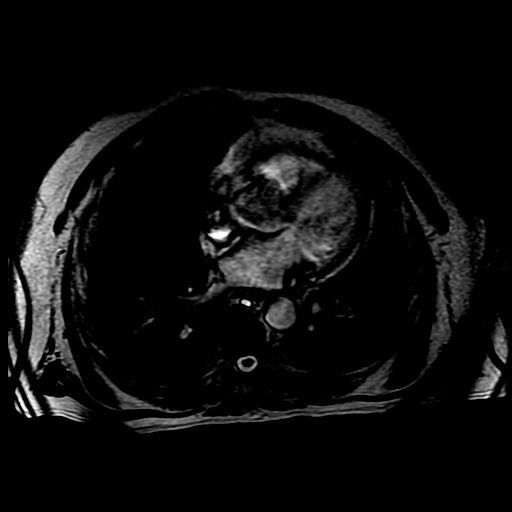

[Series 8: ax dualecho bh · axial · 5.0mm · 1.56mm/px · z∈[-67,+183]mm · 4 of 102 slices shown]
[im 1/102]
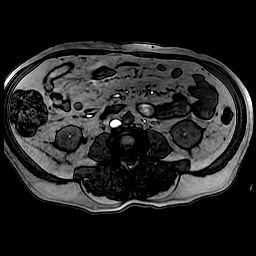
[im 34/102]
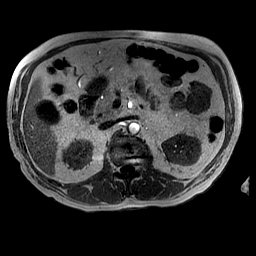
[im 68/102]
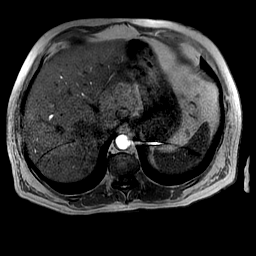
[im 102/102]
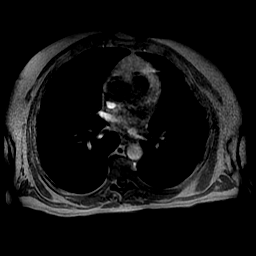

[Series 400: DWI · axial · 6.0mm · 1.48mm/px · 1 of 25 slices shown (1 of 2)]
[im 1/25]
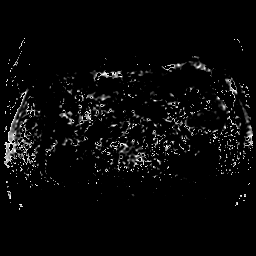

[Series 401: DWI · axial · 6.0mm · 1.48mm/px · 1 of 25 slices shown (2 of 2)]
[im 1/25]
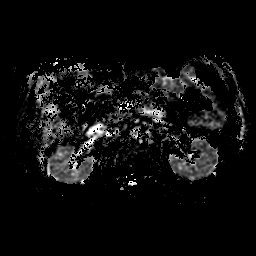

[Series 900: T1 dynamic · axial · 5.0mm · 0.78mm/px · z∈[-26,+192]mm · 3 of 88 slices shown (1 of 3)]
[im 1/88]
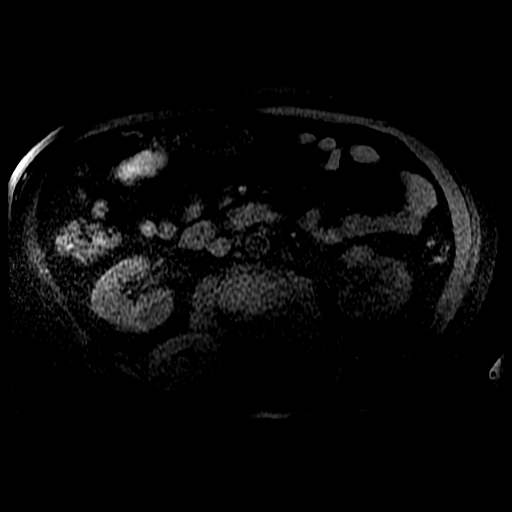
[im 44/88]
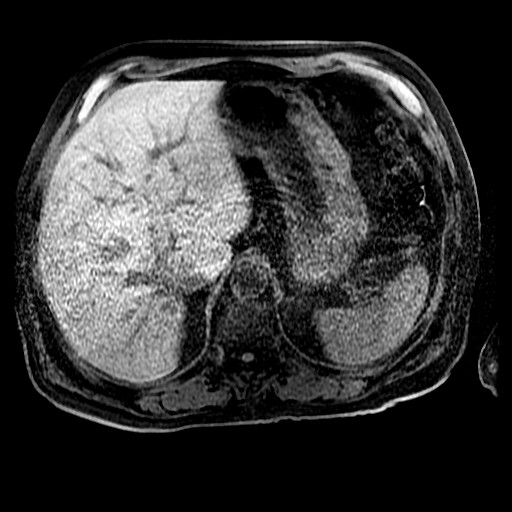
[im 88/88]
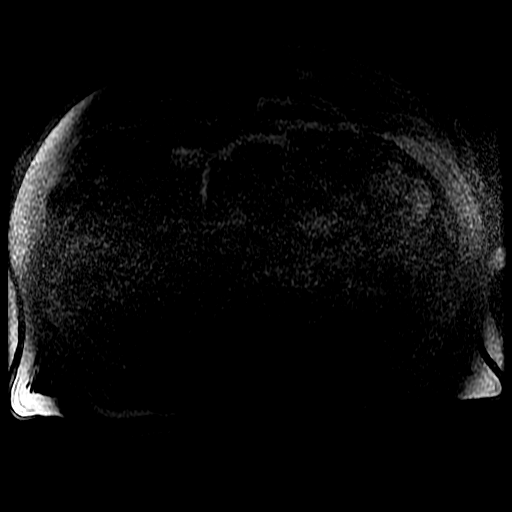

[Series 901: T1 dynamic · axial · 5.0mm · 0.78mm/px · z∈[-26,+192]mm · 3 of 88 slices shown (2 of 3)]
[im 1/88]
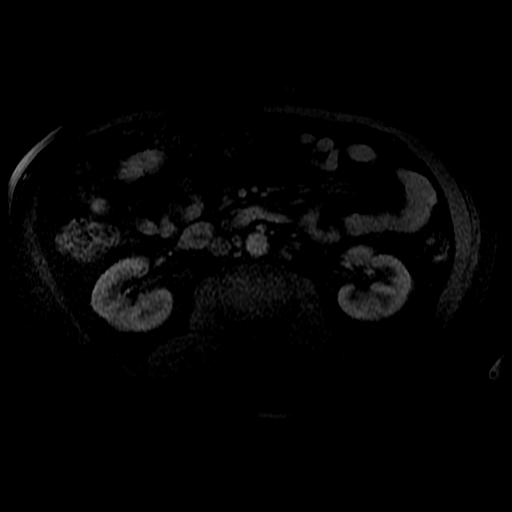
[im 44/88]
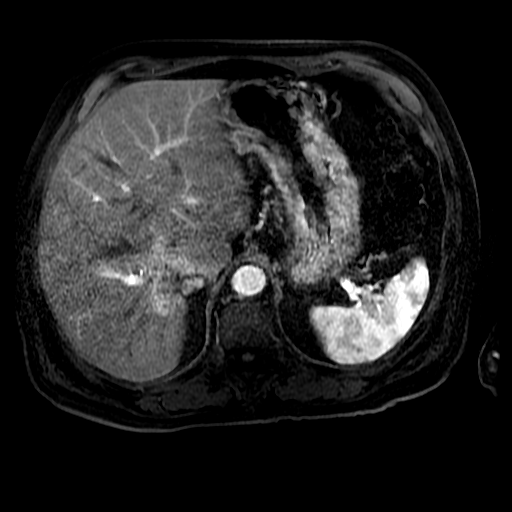
[im 88/88]
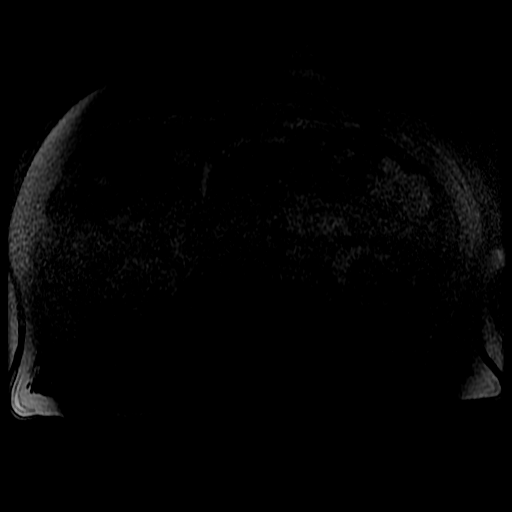

[Series 902: T1 dynamic · axial · 5.0mm · 0.78mm/px · z∈[-26,+82]mm · 2 of 88 slices shown (3 of 3)]
[im 1/88]
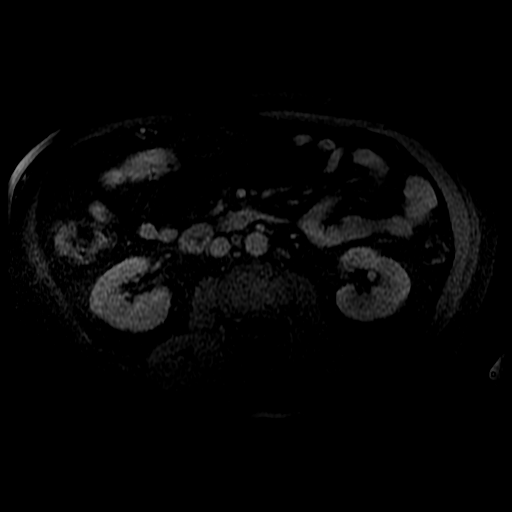
[im 44/88]
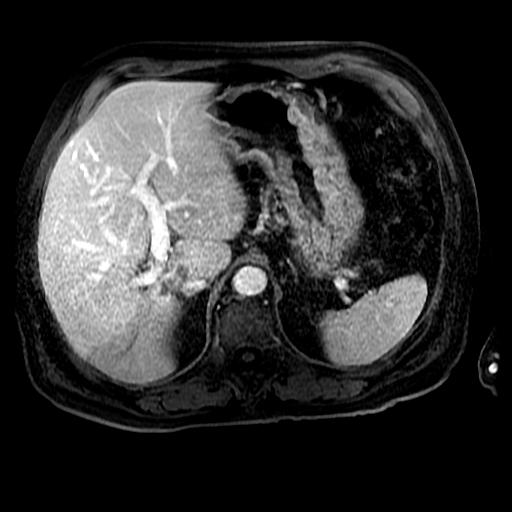

[23 of 48 positions shown; findings below may reference images not displayed]

FINDINGS: Lower chest: Unremarkable

Hepatobiliary: In the right hepatic lobe and spanning into the
caudate lobe and mostly surrounding the intrahepatic portion of the
IVC, we demonstrate a 7.3 by 5.6 by 5.0 cm (volume = 110 cm^3)
mass with high T2 and low T1 signal, restriction of diffusion,
multilobular margins, irregular marginal enhancement on portal
venous phase images, and delayed primarily marginal enhancement.
Posterior to this mass in the right hepatic lobe, there is a
separate 1.1 cm tumor nodule on image [DATE] and a separate 1.1 by
cm tumor nodule on image [DATE]. There is poor definition of the right
hepatic vein which is thought to be effaced or occluded. Right
posterior branches of the portal vein are likely occluded for
example on image 39/903.

9 mm gallstone in the gallbladder. Mild gallbladder wall thickening.
No biliary dilatation. Minimal diffuse hepatic steatosis.

Pancreas:  Unremarkable

Spleen:  Unremarkable

Adrenals/Urinary Tract: Small cysts in the upper poles of both
kidneys.

Stomach/Bowel: Unremarkable

Vascular/Lymphatic: As noted above, the tumor with its epicenter in
the right hepatic lobe is likely effacing or occluding the right
hepatic vein and posterior branches of the right portal vein.
Well-defined tumor thrombus is not seen but might be hidden by the
main tumor mass.

Aortoiliac atherosclerotic vascular disease. No definite pathologic
adenopathy is currently identified.

Other:  No supplemental non-categorized findings.

Musculoskeletal: Hemangiomas are observed in the L1 and L4 vertebral
bodies.
IMPRESSION: 1. 7.3 cm in long axis mass in the right hepatic lobe spanning into
the caudate lobe, high suspicion for malignancy such as
hepatocellular carcinoma or cholangiocarcinoma. Suspected effacement
or occlusion of the right hepatic vein and posterior branches of the
right portal vein. Two smaller tumor nodules along the posterior
periphery of the dominant mass. Tissue diagnosis is recommended.
2. No findings of pathologic adenopathy or distant metastatic
spread.
3. 9 mm gallstone in the gallbladder. There is mild gallbladder wall
thickening which may be from nondistention, correlate clinically in
assessing for cholecystitis.
4.  Aortic Atherosclerosis ([1P]-[1P]).
5. Mild diffuse hepatic steatosis.

## 2019-07-31 MED ORDER — GADOBUTROL 1 MMOL/ML IV SOLN
10.0000 mL | Freq: Once | INTRAVENOUS | Status: AC | PRN
  Administered 2019-07-31: 10 mL via INTRAVENOUS

## 2019-08-01 ENCOUNTER — Other Ambulatory Visit: Payer: Self-pay

## 2019-08-01 DIAGNOSIS — K769 Liver disease, unspecified: Secondary | ICD-10-CM

## 2019-08-05 ENCOUNTER — Telehealth: Payer: Self-pay

## 2019-08-05 NOTE — Telephone Encounter (Signed)
Pt responded "no" to all screening questions °

## 2019-08-05 NOTE — Telephone Encounter (Signed)
Covid-19 screening questions   Do you now or have you had a fever in the last 14 days?  Do you have any respiratory symptoms of shortness of breath or cough now or in the last 14 days?  Do you have any family members or close contacts with diagnosed or suspected Covid-19 in the past 14 days?  Have you been tested for Covid-19 and found to be positive?       

## 2019-08-06 ENCOUNTER — Ambulatory Visit (AMBULATORY_SURGERY_CENTER): Payer: Medicare HMO | Admitting: Gastroenterology

## 2019-08-06 ENCOUNTER — Other Ambulatory Visit (INDEPENDENT_AMBULATORY_CARE_PROVIDER_SITE_OTHER): Payer: Medicare HMO

## 2019-08-06 ENCOUNTER — Other Ambulatory Visit: Payer: Self-pay

## 2019-08-06 ENCOUNTER — Other Ambulatory Visit: Payer: Self-pay | Admitting: Gastroenterology

## 2019-08-06 ENCOUNTER — Encounter: Payer: Self-pay | Admitting: Gastroenterology

## 2019-08-06 VITALS — BP 125/79 | HR 77 | Temp 98.0°F | Resp 23 | Ht 69.0 in | Wt 216.0 lb

## 2019-08-06 DIAGNOSIS — C221 Intrahepatic bile duct carcinoma: Secondary | ICD-10-CM | POA: Diagnosis not present

## 2019-08-06 DIAGNOSIS — E119 Type 2 diabetes mellitus without complications: Secondary | ICD-10-CM | POA: Diagnosis not present

## 2019-08-06 DIAGNOSIS — K2970 Gastritis, unspecified, without bleeding: Secondary | ICD-10-CM | POA: Diagnosis not present

## 2019-08-06 DIAGNOSIS — R1013 Epigastric pain: Secondary | ICD-10-CM | POA: Diagnosis not present

## 2019-08-06 DIAGNOSIS — K769 Liver disease, unspecified: Secondary | ICD-10-CM

## 2019-08-06 DIAGNOSIS — K3189 Other diseases of stomach and duodenum: Secondary | ICD-10-CM | POA: Diagnosis not present

## 2019-08-06 DIAGNOSIS — I10 Essential (primary) hypertension: Secondary | ICD-10-CM | POA: Diagnosis not present

## 2019-08-06 LAB — HEPATIC FUNCTION PANEL
ALT: 14 U/L (ref 0–53)
AST: 16 U/L (ref 0–37)
Albumin: 3.3 g/dL — ABNORMAL LOW (ref 3.5–5.2)
Alkaline Phosphatase: 37 U/L — ABNORMAL LOW (ref 39–117)
Bilirubin, Direct: 0.1 mg/dL (ref 0.0–0.3)
Total Bilirubin: 0.4 mg/dL (ref 0.2–1.2)
Total Protein: 5.3 g/dL — ABNORMAL LOW (ref 6.0–8.3)

## 2019-08-06 MED ORDER — SODIUM CHLORIDE 0.9 % IV SOLN: 500.0000 mL | Freq: Once | INTRAVENOUS | Status: DC

## 2019-08-06 MED ORDER — SUCRALFATE 1 G PO TABS: 1.0000 g | ORAL_TABLET | Freq: Three times a day (TID) | ORAL | 1 refills | Status: DC

## 2019-08-06 NOTE — Progress Notes (Signed)
[place]  Vital signs-[place]

## 2019-08-06 NOTE — Addendum Note (Signed)
Addended by: Isaiah Serge D on: 08/06/2019 08:36 AM   Modules accepted: Orders

## 2019-08-06 NOTE — Progress Notes (Signed)
Report given to PACU, vss 

## 2019-08-06 NOTE — Op Note (Signed)
[facility] Patient Name: Brandon Sherman Procedure Date: 08/06/2019 9:50 AM MRN: [id number] Endoscopist: Remo Lipps P. Kaylin Schellenberg , MD Age: 73 Referring MD:  Date of Birth: 1945-12-09 Gender: Male Account #: [id number] Procedure:                Upper GI endoscopy Indications:              Epigastric abdominal pain, Follow-up of gastritis -                            severe inflammatory changes / nodular mucosa noted                            on EGD in 05/2019 - path showed gastritis, H pylori                            negative, no dysplasia, recently noted liver mass                            on MRI pending biopsy, on omeprazole 40mg  twice                            daily Medicines:                Monitored Anesthesia Care Procedure:                Pre-Anesthesia Assessment:                           - Prior to the procedure, a History and Physical                            was performed, and patient medications and                            allergies were reviewed. The patient's tolerance of                            previous anesthesia was also reviewed. The risks                            and benefits of the procedure and the sedation                            options and risks were discussed with the patient.                            All questions were answered, and informed consent                            was obtained. Prior Anticoagulants: The patient has                            taken no previous anticoagulant or antiplatelet  agents. ASA Grade Assessment: II - A patient with                            mild systemic disease. After reviewing the risks                            and benefits, the patient was deemed in                            satisfactory condition to undergo the procedure.                           After obtaining informed consent, the endoscope was                            passed under direct vision. Throughout  the                            procedure, the patient's blood pressure, pulse, and                            oxygen saturations were monitored continuously. The                            Endoscope was introduced through the mouth, and                            advanced to the second part of duodenum. The upper                            GI endoscopy was accomplished without difficulty.                            The patient tolerated the procedure well. Scope In: Scope Out: Findings:                 Esophagogastric landmarks were identified: the                            Z-line was found at 42 cm, the gastroesophageal                            junction was found at 42 cm and the upper extent of                            the gastric folds was found at 42 cm from the                            incisors.                           The exam of the esophagus was otherwise normal.  Diffuse inflammation characterized by erythema and                            nodular mucosa was found in the entire examined                            stomach. No focal ulcerations. Several biopsies                            were taken with a cold forceps for histology.                           The duodenal bulb and second portion of the                            duodenum were normal. Complications:            No immediate complications. Estimated blood loss:                            Minimal. Estimated Blood Loss:     Estimated blood loss was minimal. Impression:               - Esophagogastric landmarks identified.                           - Normal esophagus otherwise.                           - Gastritis / nodular atypical appearing mucosa                            throughout the entire stomach - no significant                            improvement on twice daily PPI, unclear etiology.                            Biopsied.                           - Normal duodenal bulb  and second portion of the                            duodenum. Recommendation:           - Patient has a contact number available for                            emergencies. The signs and symptoms of potential                            delayed complications were discussed with the                            patient. Return to normal activities tomorrow.  Written discharge instructions were provided to the                            patient.                           - Resume previous diet.                           - Continue present medications.                           - Trial of carafate tasblet 1gm every 6 hours as                            needed                           - Await pathology results.                           - Await liver biopsy scheduled for next week Remo Lipps P. Teruo Stilley, MD 08/06/2019 10:15:31 AM This report has been signed electronically.

## 2019-08-06 NOTE — Progress Notes (Signed)
Called to room to assist during endoscopic procedure.  Patient ID and intended procedure confirmed with present staff. Received instructions for my participation in the procedure from the performing physician.  

## 2019-08-06 NOTE — Patient Instructions (Signed)
Please read handouts provided. Continue present medications. Await pathology results. Carafate tablet 1 gram every six hours as needed.       YOU HAD AN ENDOSCOPIC PROCEDURE TODAY AT [facility]:   Refer to the procedure report that was given to you for any specific questions about what was found during the examination.  If the procedure report does not answer your questions, please call your gastroenterologist to clarify.  If you requested that your care partner not be given the details of your procedure findings, then the procedure report has been included in a sealed envelope for you to review at your convenience later.  YOU SHOULD EXPECT: Some feelings of bloating in the abdomen. Passage of more gas than usual.  Walking can help get rid of the air that was put into your GI tract during the procedure and reduce the bloating. If you had a lower endoscopy (such as a colonoscopy or flexible sigmoidoscopy) you may notice spotting of blood in your stool or on the toilet paper. If you underwent a bowel prep for your procedure, you may not have a normal bowel movement for a few days.  Please Note:  You might notice some irritation and congestion in your nose or some drainage.  This is from the oxygen used during your procedure.  There is no need for concern and it should clear up in a day or so.  SYMPTOMS TO REPORT IMMEDIATELY:   Following upper endoscopy (EGD)  Vomiting of blood or coffee ground material  New chest pain or pain under the shoulder blades  Painful or persistently difficult swallowing  New shortness of breath  Fever of 100F or higher  Black, tarry-looking stools  For urgent or emergent issues, a gastroenterologist can be reached at any hour by calling [phone].   DIET:  We do recommend a small meal at first, but then you may proceed to your regular diet.  Drink plenty of fluids but you should avoid alcoholic beverages for 24 hours.  ACTIVITY:  You  should plan to take it easy for the rest of today and you should NOT DRIVE or use heavy machinery until tomorrow (because of the sedation medicines used during the test).    FOLLOW UP: Our staff will call the number listed on your records 48-72 hours following your procedure to check on you and address any questions or concerns that you may have regarding the information given to you following your procedure. If we do not reach you, we will leave a message.  We will attempt to reach you two times.  During this call, we will ask if you have developed any symptoms of COVID 19. If you develop any symptoms (ie: fever, flu-like symptoms, shortness of breath, cough etc.) before then, please call [phone].  If you test positive for Covid 19 in the 2 weeks post procedure, please call and report this information to [place].    If any biopsies were taken you will be contacted by phone or by letter within the next 1-3 weeks.  Please call us at [phone] if you have not heard about the biopsies in 3 weeks.    SIGNATURES/CONFIDENTIALITY: You and/or your care partner have signed paperwork which will be entered into your electronic medical record.  These signatures attest to the fact that that the information above on your After Visit Summary has been reviewed and is understood.  Full responsibility of the confidentiality of this discharge information lies with you and/or  your care-partner. 

## 2019-08-07 ENCOUNTER — Other Ambulatory Visit: Payer: Self-pay | Admitting: Radiology

## 2019-08-07 LAB — HEPATITIS C ANTIBODY
Hepatitis C Ab: NONREACTIVE
SIGNAL TO CUT-OFF: 0 (ref <1.00)

## 2019-08-07 LAB — HEPATITIS B SURFACE ANTIGEN: Hepatitis B Surface Ag: NONREACTIVE

## 2019-08-07 LAB — AFP TUMOR MARKER: AFP-Tumor Marker: 5 ng/mL (ref <6.1)

## 2019-08-07 LAB — HEPATITIS A ANTIBODY, TOTAL: Hepatitis A AB,Total: REACTIVE — AB

## 2019-08-07 LAB — CA 19-9 (SERIAL): CA 19-9: <2 U/mL (ref 0–35)

## 2019-08-07 NOTE — Progress Notes (Signed)
Pt wife called regarding his biopsy on Monday. Reviewed instructions with pt wife, she verbalized understanding.

## 2019-08-08 ENCOUNTER — Telehealth: Payer: Self-pay

## 2019-08-08 ENCOUNTER — Telehealth: Payer: Self-pay | Admitting: *Deleted

## 2019-08-08 NOTE — Telephone Encounter (Signed)
  Follow up Call-  Call back number 08/06/2019 06/03/2019  Post procedure Call Back phone  # [phone]  Permission to leave phone message Yes Yes  Some recent data might be hidden     Patient questions:  Do you have a fever, pain , or abdominal swelling? No. Pain Score  0 *  Have you tolerated food without any problems? Yes.    Have you been able to return to your normal activities? Yes.    Do you have any questions about your discharge instructions: Diet   No. Medications  No. Follow up visit  No.  Do you have questions or concerns about your Care? No.  Actions: * If pain score is 4 or above: No action needed, pain <4.  1. Have you developed a fever since your procedure? no  2.   Have you had an respiratory symptoms (SOB or cough) since your procedure? no  3.   Have you tested positive for COVID 19 since your procedure no  4.   Have you had any family members/close contacts diagnosed with the COVID 19 since your procedure?  no   If yes to any of these questions please route to Joylene John, RN and Alphonsa Gin, Therapist, sports.

## 2019-08-08 NOTE — Telephone Encounter (Signed)
First post procedure follow up call, no answer 

## 2019-08-11 ENCOUNTER — Other Ambulatory Visit: Payer: Self-pay

## 2019-08-11 ENCOUNTER — Encounter (HOSPITAL_COMMUNITY): Payer: Self-pay

## 2019-08-11 ENCOUNTER — Ambulatory Visit (HOSPITAL_COMMUNITY)
Admission: RE | Admit: 2019-08-11 | Discharge: 2019-08-11 | Disposition: A | Discharge status: Home / Self Care | Payer: Medicare HMO | Source: Ambulatory Visit | Attending: Gastroenterology | Admitting: Gastroenterology

## 2019-08-11 DIAGNOSIS — Z79899 Other long term (current) drug therapy: Secondary | ICD-10-CM | POA: Diagnosis not present

## 2019-08-11 DIAGNOSIS — Z7984 Long term (current) use of oral hypoglycemic drugs: Secondary | ICD-10-CM | POA: Insufficient documentation

## 2019-08-11 DIAGNOSIS — E785 Hyperlipidemia, unspecified: Secondary | ICD-10-CM | POA: Diagnosis not present

## 2019-08-11 DIAGNOSIS — Z7982 Long term (current) use of aspirin: Secondary | ICD-10-CM | POA: Insufficient documentation

## 2019-08-11 DIAGNOSIS — K219 Gastro-esophageal reflux disease without esophagitis: Secondary | ICD-10-CM | POA: Diagnosis not present

## 2019-08-11 DIAGNOSIS — Z7901 Long term (current) use of anticoagulants: Secondary | ICD-10-CM | POA: Insufficient documentation

## 2019-08-11 DIAGNOSIS — K7689 Other specified diseases of liver: Secondary | ICD-10-CM | POA: Diagnosis not present

## 2019-08-11 DIAGNOSIS — E118 Type 2 diabetes mellitus with unspecified complications: Secondary | ICD-10-CM | POA: Diagnosis not present

## 2019-08-11 DIAGNOSIS — I10 Essential (primary) hypertension: Secondary | ICD-10-CM | POA: Diagnosis not present

## 2019-08-11 DIAGNOSIS — K769 Liver disease, unspecified: Secondary | ICD-10-CM

## 2019-08-11 DIAGNOSIS — C229 Malignant neoplasm of liver, not specified as primary or secondary: Secondary | ICD-10-CM | POA: Diagnosis not present

## 2019-08-11 LAB — CBC
HCT: 47.7 % (ref 39.0–52.0)
Hemoglobin: 16.1 g/dL (ref 13.0–17.0)
MCH: 31.5 pg (ref 26.0–34.0)
MCHC: 33.8 g/dL (ref 30.0–36.0)
MCV: 93.3 fL (ref 80.0–100.0)
Platelets: 322 10*3/uL (ref 150–400)
RBC: 5.11 MIL/uL (ref 4.22–5.81)
RDW: 13.2 % (ref 11.5–15.5)
WBC: 9.8 10*3/uL (ref 4.0–10.5)
nRBC: 0 % (ref 0.0–0.2)

## 2019-08-11 LAB — GLUCOSE, CAPILLARY: Glucose-Capillary: 91 mg/dL (ref 70–99)

## 2019-08-11 LAB — PROTIME-INR
INR: 1 (ref 0.8–1.2)
Prothrombin Time: 13.5 seconds (ref 11.4–15.2)

## 2019-08-11 IMAGING — US US BIOPSY CORE LIVER
1 series · 11 of 11 positions shown · non-contrast
Comparison: none

INDICATION: 73-year-old with a suspicious hepatic lesion and needs a tissue
diagnosis.

[Series 1: us biopsy core liver · 11 of 11 slices shown]
[im 1/11]
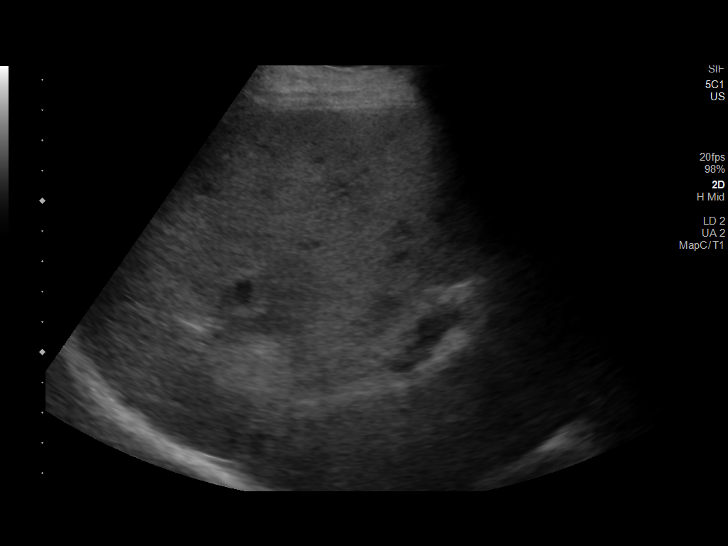
[im 2/11]
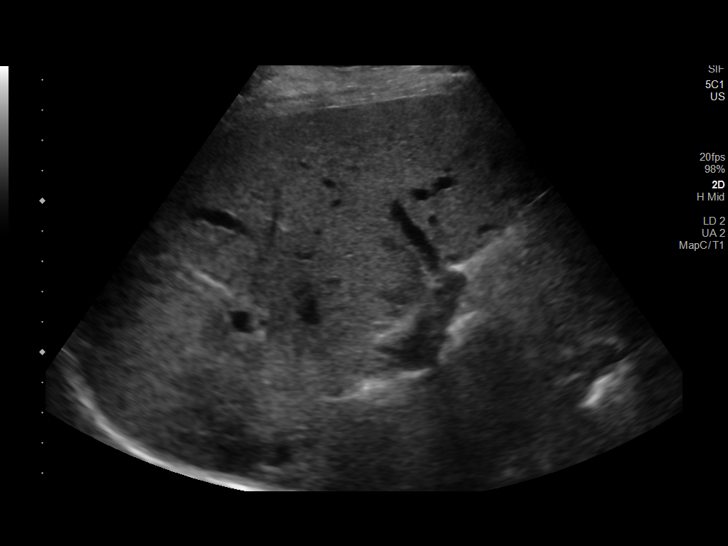
[im 3/11]
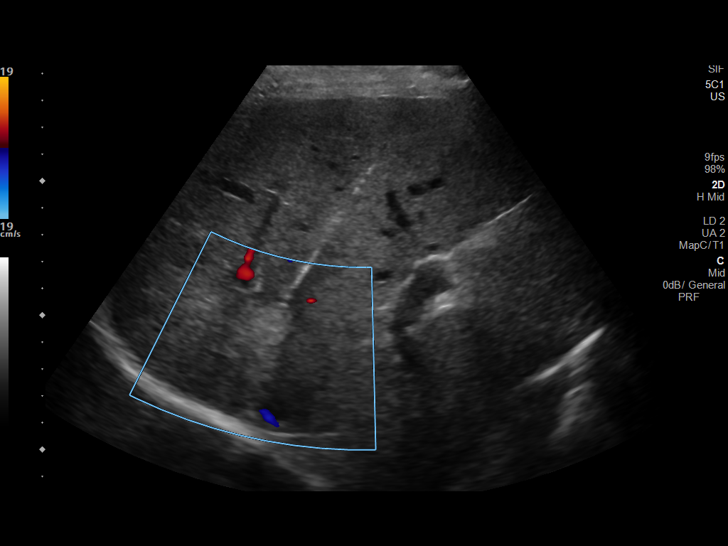
[im 4/11]
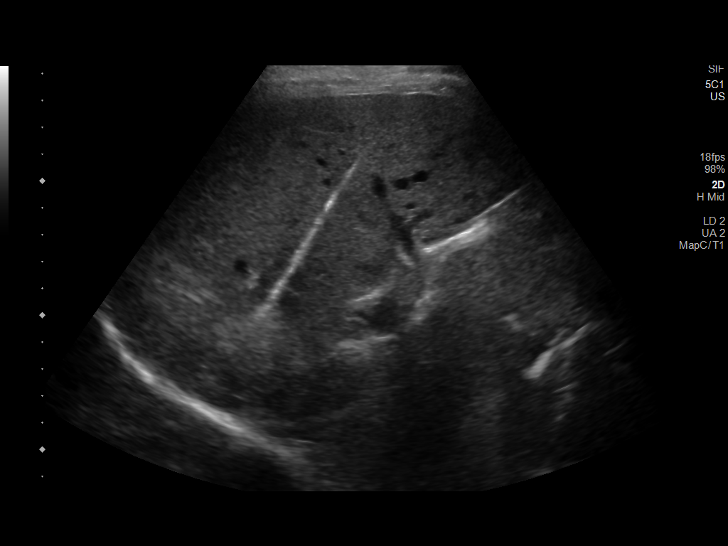
[im 5/11]
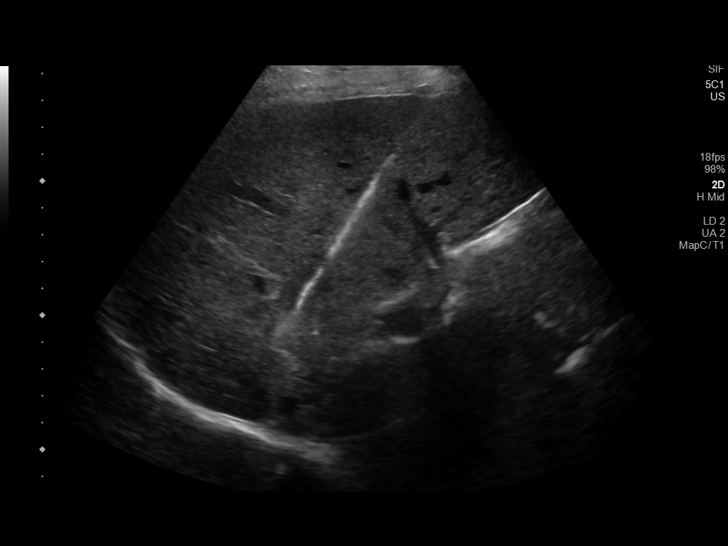
[im 6/11]
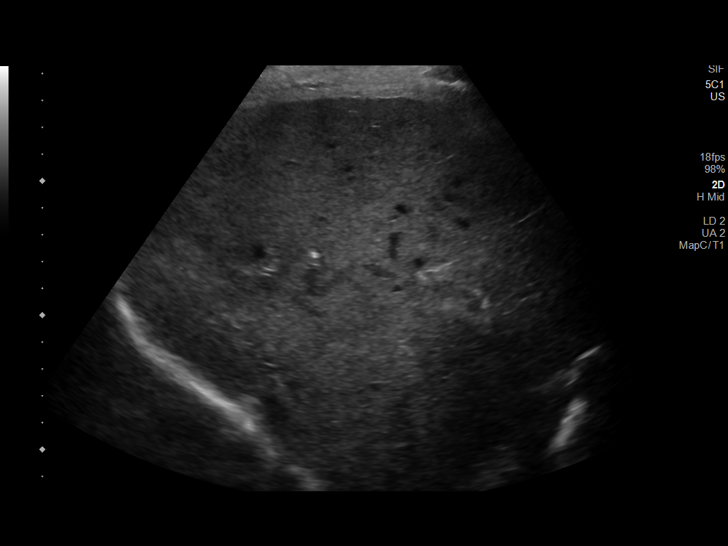
[im 7/11]
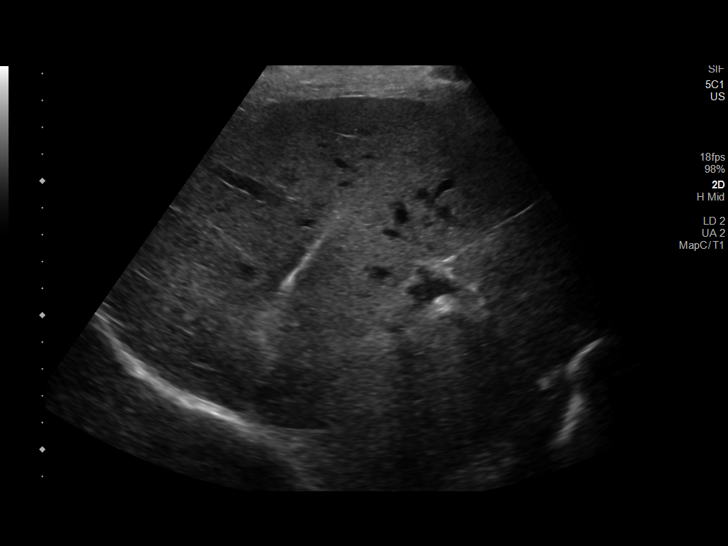
[im 8/11]
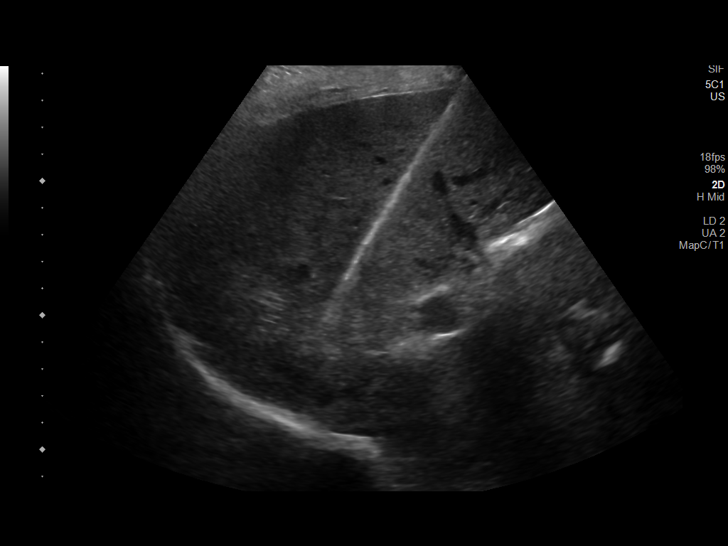
[im 9/11]
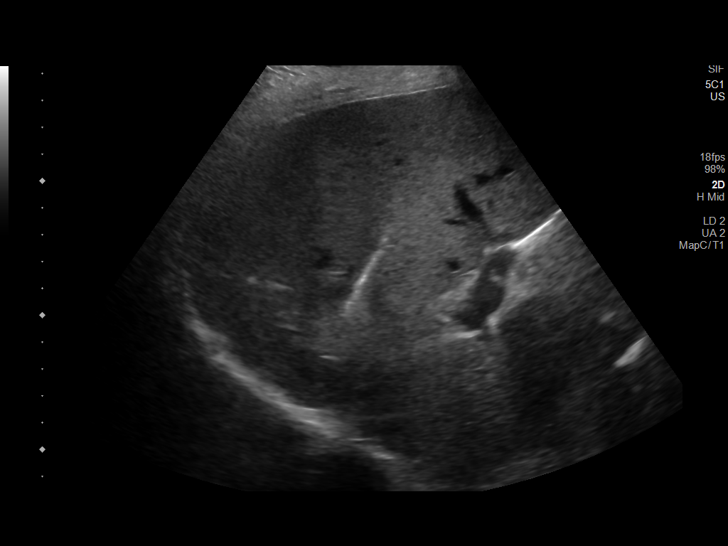
[im 10/11]
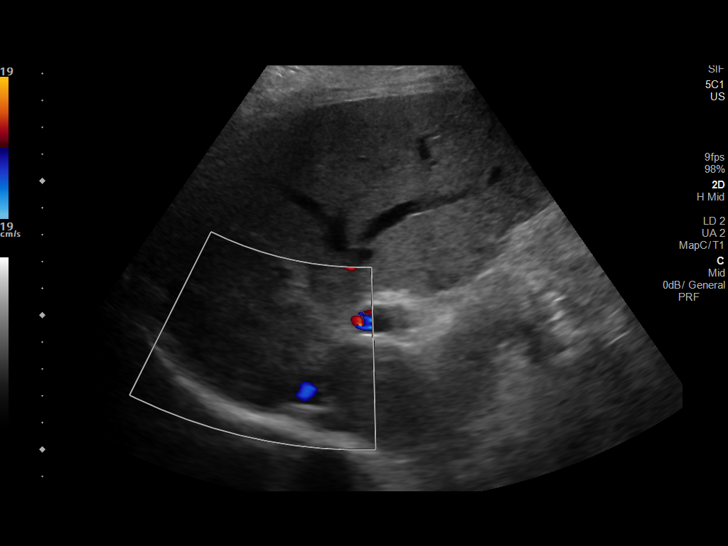
[im 11/11]
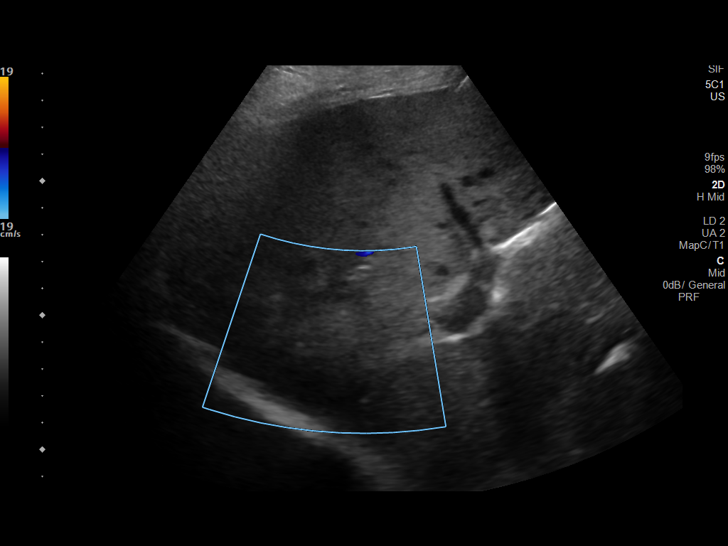

[11 of 11 positions shown; findings below may reference images not displayed]

EXAM:
ULTRASOUND-GUIDED LIVER LESION BIOPSY

MEDICATIONS:
None.

ANESTHESIA/SEDATION:
Moderate (conscious) sedation was employed during this procedure. A
total of Versed 2.0 mg and Fentanyl 100 mcg was administered
intravenously.

Moderate Sedation Time: 26 minutes. The patient's level of
consciousness and vital signs were monitored continuously by
radiology nursing throughout the procedure under my direct
supervision.

FLUOROSCOPY TIME:  None

COMPLICATIONS:
None immediate.

PROCEDURE:
Informed written consent was obtained from the patient after a
thorough discussion of the procedural risks, benefits and
alternatives. All questions were addressed. Maximal Sterile Barrier
Technique was utilized including caps, mask, sterile gowns, sterile
gloves, sterile drape, hand hygiene and skin antiseptic. A timeout
was performed prior to the initiation of the procedure.

Liver was evaluated with ultrasound. Subtle hyperechoic lesion was
identified in the central right hepatic lobe. The right side of the
abdomen was prepped with chlorhexidine and sterile field was
created. Skin and soft tissues were anesthetized with 1% lidocaine.
17 gauge needle was directed into the right hepatic lesion using
ultrasound guidance. Core biopsies were obtained with an 18 gauge
core device. Specimens placed in formalin. Needle was removed
without complication. Bandage placed over the puncture site.
FINDINGS: Subtle hyperechoic lesion in the central right hepatic lobe near the
IVC. Margins of this lesion are ill-defined but the location of
heterogeneity corresponds with the lesion seen on the prior
cross-sectional imaging. Core biopsies were obtained at the area of
concern. No significant bleeding or hematoma formation following the
core biopsies.
IMPRESSION: Ultrasound-guided core biopsies of the poorly defined right hepatic
lesion. If the ultrasound-guided core biopsy is inconclusive, we
could consider a repeat biopsy with CT guidance.

## 2019-08-11 MED ORDER — SODIUM CHLORIDE 0.9 % IV SOLN
INTRAVENOUS | Status: AC | PRN
  Administered 2019-08-11: 10 mL/h via INTRAVENOUS

## 2019-08-11 MED ORDER — GELATIN ABSORBABLE 12-7 MM EX MISC
CUTANEOUS | Status: AC
  Filled 2019-08-11: qty 1

## 2019-08-11 MED ORDER — FENTANYL CITRATE (PF) 100 MCG/2ML IJ SOLN
INTRAMUSCULAR | Status: AC | PRN
  Administered 2019-08-11 (×3): 25 ug via INTRAVENOUS

## 2019-08-11 MED ORDER — SODIUM CHLORIDE 0.9 % IV SOLN: INTRAVENOUS | Status: DC

## 2019-08-11 MED ORDER — LIDOCAINE HCL (PF) 1 % IJ SOLN
INTRAMUSCULAR | Status: AC
  Filled 2019-08-11: qty 30

## 2019-08-11 MED ORDER — FENTANYL CITRATE (PF) 100 MCG/2ML IJ SOLN
INTRAMUSCULAR | Status: AC
  Filled 2019-08-11: qty 2

## 2019-08-11 MED ORDER — MIDAZOLAM HCL 2 MG/2ML IJ SOLN
INTRAMUSCULAR | Status: AC
  Filled 2019-08-11: qty 2

## 2019-08-11 MED ORDER — MIDAZOLAM HCL 2 MG/2ML IJ SOLN
INTRAMUSCULAR | Status: AC | PRN
  Administered 2019-08-11: 1 mg via INTRAVENOUS
  Administered 2019-08-11: 0.5 mg via INTRAVENOUS

## 2019-08-11 NOTE — Procedures (Signed)
Interventional Radiology Procedure:   Indications: Liver lesion  Procedure: US guided liver lesion biopsy  Findings: Subtle hyperechoic lesion in right hepatic lobe.  Core biopsies obtained.   Complications: None     EBL: Minimal, less than 20 ml  Plan: Bedrest 3 hours then discharge to home.     Jamelle Noy R. Anselm Pancoast, MD  Pager: [phone]

## 2019-08-11 NOTE — H&P (Signed)
Chief Complaint: Patient was seen in consultation today for liver lesion biopsy at the request of [place]  Referring Physician(s): [name],Steven P  Supervising Physician: Markus Daft  Patient Status: [facility] - Out-pt  History of Present Illness: Brandon Sherman is a 73 y.o. male   abd pain- Epigastric Burning sensation-- gastritis on Endo N/V Wt loss  Symptoms x 6 mo  GI MD Dr Havery Moros CT 07/18/19: IMPRESSION: 1. Heterogeneous hypodensity posteriorly in the right hepatic lobe and potentially extending into the caudate lobe suspicious for a mass. There is felt to be truncation of branches of the portal vein in this vicinity and some narrowing of the hepatic vein, as well as triangular-shaped regions of abnormal hypoenhancement posteriorly in the right hepatic lobe likely representing downstream vascular effects. Cannot exclude malignancy such as cholangiocarcinoma or hepatocellular carcinoma, and follow up hepatic protocol MRI with and without contrast is recommended to further characterize. 2. 4 mm right middle lobe pulmonary nodule is likely benign but may merit surveillance.  MRI 07/31/19:  IMPRESSION: 1. 7.3 cm in long axis mass in the right hepatic lobe spanning into the caudate lobe, high suspicion for malignancy such as hepatocellular carcinoma or cholangiocarcinoma. Suspected effacement or occlusion of the right hepatic vein and posterior branches of the right portal vein. Two smaller tumor nodules along the posterior periphery of the dominant mass. Tissue diagnosis is recommended. 2. No findings of pathologic adenopathy or distant metastatic spread. 3. 9 mm gallstone in the gallbladder. There is mild gallbladder wall thickening which may be from nondistention, correlate clinically in assessing for cholecystitis. 4.  Aortic Atherosclerosis (ICD10-I70.0). 5. Mild diffuse hepatic steatosis.   Scheduled now for biopsy of liver lesion  Past  Medical History:  Diagnosis Date   Arthritis    Diabetes ([place])    GERD (gastroesophageal reflux disease)    Hyperlipidemia    Hypertension     Past Surgical History:  Procedure Laterality Date   APPENDECTOMY  1980   COLONOSCOPY      Allergies: Patient has no known allergies.  Medications: Prior to Admission medications   Medication Sig Start Date End Date Taking? Authorizing Provider  allopurinol (ZYLOPRIM) 100 MG tablet Take 100 mg by mouth daily.   Yes [provider]  amLODipine (NORVASC) 2.5 MG tablet Take 2.5 mg by mouth daily.   Yes [provider]  aspirin EC 81 MG tablet Take 81 mg by mouth daily.   Yes [provider]  fenofibrate (TRICOR) 145 MG tablet Take 145 mg by mouth daily.   Yes [provider]  finasteride (PROSCAR) 5 MG tablet Take 5 mg by mouth daily.   Yes [provider]  lisinopril (ZESTRIL) 40 MG tablet Take 40 mg by mouth daily.   Yes [provider]  metFORMIN (GLUCOPHAGE) 500 MG tablet Take 500 mg by mouth 2 (two) times daily with a meal.    Yes [provider]  ondansetron (ZOFRAN) 4 MG tablet Take 4 mg by mouth every 8 (eight) hours as needed for nausea or vomiting.   Yes [provider]  pantoprazole (PROTONIX) 40 MG tablet Take 1 tablet (40 mg total) by mouth 2 (two) times daily. 07/16/19  Yes Zehr, Laban Emperor, PA-C  pravastatin (PRAVACHOL) 40 MG tablet Take 40 mg by mouth daily.   Yes [provider]  sucralfate (CARAFATE) 1 g tablet Take 1 tablet (1 g total) by mouth 4 (four) times daily -  with meals and at bedtime. 08/06/19  Yes [name], [name], MD  tamsulosin (FLOMAX) 0.4 MG CAPS capsule Take 0.4 mg by mouth daily.   Yes [provider]  Vitamin D, Ergocalciferol, (DRISDOL) 1.25 MG (50000 UT) CAPS capsule Take 50,000 Units by mouth every 7 (seven) days.   Yes [provider]  famotidine (PEPCID) 40 MG tablet Take 0.5 tablets (20 mg total) by  mouth 2 (two) times daily. Patient not taking: Reported on 06/03/2019 04/29/19   Yetta Flock, MD  promethazine (PHENERGAN) 12.5 MG tablet Take 1 tablet (12.5 mg total) by mouth every 6 (six) hours as needed for nausea or vomiting. Patient not taking: Reported on 08/07/2019 07/17/19   Zehr, Laban Emperor, PA-C     Family History  Problem Relation Age of Onset   Heart attack Mother    Stroke Father    Colon cancer Neg Hx    Esophageal cancer Neg Hx    Stomach cancer Neg Hx    Rectal cancer Neg Hx    Colon polyps Neg Hx     Social History   Socioeconomic History   Marital status: Married    Spouse name: Not on file   Number of children: Not on file   Years of education: Not on file   Highest education level: Not on file  Occupational History   Not on file  Social Needs   Financial resource strain: Not on file   Food insecurity    Worry: Not on file    Inability: Not on file   Transportation needs    Medical: Not on file    Non-medical: Not on file  Tobacco Use   Smoking status: Never Smoker   Smokeless tobacco: Current User    Types: Chew  Substance and Sexual Activity   Alcohol use: Not Currently    Comment: occasionally   Drug use: Never   Sexual activity: Not on file  Lifestyle   Physical activity    Days per week: Not on file    Minutes per session: Not on file   Stress: Not on file  Relationships   Social connections    Talks on phone: Not on file    Gets together: Not on file    Attends religious service: Not on file    Active member of club or organization: Not on file    Attends meetings of clubs or organizations: Not on file    Relationship status: Not on file  Other Topics Concern   Not on file  Social History Narrative   Not on file    Review of Systems: A 12 point ROS discussed and pertinent positives are indicated in the HPI above.  All other systems are negative.  Review of Systems  Constitutional: Positive for  appetite change and unexpected weight change.  Respiratory: Negative for cough and shortness of breath.   Cardiovascular: Negative for chest pain.  Gastrointestinal: Positive for abdominal distention and abdominal pain.  Musculoskeletal: Negative for back pain.  Neurological: Negative for weakness.  Psychiatric/Behavioral: Negative for behavioral problems and confusion.    Vital Signs: BP 125/79    Pulse 96    Temp 97.7 F (36.5 C) (Skin)    Resp 16    Ht 5\' 9"  (1.753 m)    Wt 215 lb (97.5 kg)    SpO2 100%    BMI 31.75 kg/m   Physical Exam Vitals signs reviewed.  Cardiovascular:     Rate and Rhythm: Normal rate and regular rhythm.  Heart sounds: Normal heart sounds.  Pulmonary:     Effort: Pulmonary effort is normal.     Breath sounds: Normal breath sounds.  Abdominal:     Palpations: Abdomen is soft.     Tenderness: There is abdominal tenderness.  Musculoskeletal: Normal range of motion.  Skin:    General: Skin is warm and dry.  Neurological:     Mental Status: He is alert and oriented to person, place, and time.  Psychiatric:        Mood and Affect: Mood normal.        Behavior: Behavior normal.        Thought Content: Thought content normal.        Judgment: Judgment normal.     Imaging: Ct Abdomen Pelvis W Contrast  Result Date: 07/18/2019 CLINICAL DATA:  Epigastric pain and burning sensation for 3-4 months. Nausea and vomiting. Gastritis on recent endoscopy. EXAM: CT ABDOMEN AND PELVIS WITH CONTRAST TECHNIQUE: Multidetector CT imaging of the abdomen and pelvis was performed using the standard protocol following bolus administration of intravenous contrast. CONTRAST:  112mL OMNIPAQUE IOHEXOL 300 MG/ML  SOLN COMPARISON:  None. FINDINGS: Lower chest: 4 mm right middle lobe pulmonary nodule on image 7/3. Descending thoracic aortic atherosclerotic vascular disease. Hepatobiliary: Abnormal hypodensity suspicious for potential mass medially in the right hepatic lobe and  potentially extending into the caudate lobe, measuring approximately 6.2 by 5.6 by 5.4 cm, and with triangular regions of hypoenhancement posteriorly in the right hepatic lobe probably from truncation of portions of the right portal vein and/or right hepatic vein. 1.0 cm gallstone in the gallbladder. Mild gallbladder wall thickening circumferentially, nonspecific. No appreciable biliary dilatation. Pancreas: Unremarkable Spleen: Unremarkable Adrenals/Urinary Tract: 1.1 by 0.9 by 0.9 cm hypodense lesion in the left kidney upper pole is statistically likely to be a benign cyst but technically too small to characterize. There is a similar 0.9 by 0.7 cm lesion in the right kidney upper pole, image 15/5. Stomach/Bowel: Unremarkable Vascular/Lymphatic: Aortoiliac atherosclerotic vascular disease. Reproductive: Prostate gland measures 5.4 by 4.7 by 5.3 cm (volume = 70 cm^3). Other: No supplemental non-categorized findings. Musculoskeletal: Lower lumbar spondylosis and degenerative disc disease causing impingement at L3-4 and L4-5. IMPRESSION: 1. Heterogeneous hypodensity posteriorly in the right hepatic lobe and potentially extending into the caudate lobe suspicious for a mass. There is felt to be truncation of branches of the portal vein in this vicinity and some narrowing of the hepatic vein, as well as triangular-shaped regions of abnormal hypoenhancement posteriorly in the right hepatic lobe likely representing downstream vascular effects. Cannot exclude malignancy such as cholangiocarcinoma or hepatocellular carcinoma, and follow up hepatic protocol MRI with and without contrast is recommended to further characterize. 2. 4 mm right middle lobe pulmonary nodule is likely benign but may merit surveillance. 3. Cholelithiasis. 4.  Aortic Atherosclerosis (ICD10-I70.0). 5. Prostatomegaly. 6. Mild impingement at L3-4 and L4-5. Electronically Signed   By: Van Clines M.D.   On: 07/18/2019 17:47   Mr Liver W Wo  Contrast  Result Date: 08/01/2019 CLINICAL DATA:  Right hepatic lobe lesion for further characterization. EXAM: MRI ABDOMEN WITHOUT AND WITH CONTRAST TECHNIQUE: Multiplanar multisequence MR imaging of the abdomen was performed both before and after the administration of intravenous contrast. CONTRAST:  21mL GADAVIST GADOBUTROL 1 MMOL/ML IV SOLN COMPARISON:  CT abdomen 07/18/2019 FINDINGS: Lower chest: Unremarkable Hepatobiliary: In the right hepatic lobe and spanning into the caudate lobe and mostly surrounding the intrahepatic portion of the IVC, we demonstrate a 7.3  by 5.6 by 5.0 cm (volume = 110 cm^3) mass with high T2 and low T1 signal, restriction of diffusion, multilobular margins, irregular marginal enhancement on portal venous phase images, and delayed primarily marginal enhancement. Posterior to this mass in the right hepatic lobe, there is a separate 1.1 cm tumor nodule on image 13/3 and a separate 1.1 by 0.8 cm tumor nodule on image 15/3. There is poor definition of the right hepatic vein which is thought to be effaced or occluded. Right posterior branches of the portal vein are likely occluded for example on image 39/903. 9 mm gallstone in the gallbladder. Mild gallbladder wall thickening. No biliary dilatation. Minimal diffuse hepatic steatosis. Pancreas:  Unremarkable Spleen:  Unremarkable Adrenals/Urinary Tract: Small cysts in the upper poles of both kidneys. Stomach/Bowel: Unremarkable Vascular/Lymphatic: As noted above, the tumor with its epicenter in the right hepatic lobe is likely effacing or occluding the right hepatic vein and posterior branches of the right portal vein. Well-defined tumor thrombus is not seen but might be hidden by the main tumor mass. Aortoiliac atherosclerotic vascular disease. No definite pathologic adenopathy is currently identified. Other:  No supplemental non-categorized findings. Musculoskeletal: Hemangiomas are observed in the L1 and L4 vertebral bodies. IMPRESSION:  1. 7.3 cm in long axis mass in the right hepatic lobe spanning into the caudate lobe, high suspicion for malignancy such as hepatocellular carcinoma or cholangiocarcinoma. Suspected effacement or occlusion of the right hepatic vein and posterior branches of the right portal vein. Two smaller tumor nodules along the posterior periphery of the dominant mass. Tissue diagnosis is recommended. 2. No findings of pathologic adenopathy or distant metastatic spread. 3. 9 mm gallstone in the gallbladder. There is mild gallbladder wall thickening which may be from nondistention, correlate clinically in assessing for cholecystitis. 4.  Aortic Atherosclerosis (ICD10-I70.0). 5. Mild diffuse hepatic steatosis. Electronically Signed   By: Van Clines M.D.   On: 08/01/2019 09:05    Labs:  CBC: Recent Labs    06/26/19 0801 08/11/19 1059  WBC 10.3 9.8  HGB 14.4 16.1  HCT 43.7 47.7  PLT 286.0 322    COAGS: Recent Labs    08/11/19 1059  INR 1.0    BMP: Recent Labs    06/26/19 0801  NA 140  K 3.6  CL 109  CO2 23  GLUCOSE 106*  BUN 14  CALCIUM 8.6  CREATININE 0.88    LIVER FUNCTION TESTS: Recent Labs    06/26/19 0801 08/06/19 0836  BILITOT 0.3 0.4  AST 16 16  ALT 16 14  ALKPHOS 28* 37*  PROT 4.7* 5.3*  ALBUMIN 3.1* 3.3*    TUMOR MARKERS: Recent Labs    08/06/19 0836  AFPTM 5.0    Assessment and Plan:  Liver lesion CT and MRI Scheduled for biopsy of same Risks and benefits of Liver lesion biopsy was discussed with the patient and/or patient's family including, but not limited to bleeding, infection, damage to adjacent structures or low yield requiring additional tests.  All of the questions were answered and there is agreement to proceed. Consent signed and in chart.  Thank you for this interesting consult.  I greatly enjoyed meeting SHAMIER DOMBECK and look forward to participating in their care.  A copy of this report was sent to the requesting provider on this  date.  Electronically Signed: Lavonia Drafts, PA-C 08/11/2019, 11:49 AM   I spent a total of  30 Minutes   in face to face in clinical consultation, greater than  50% of which was counseling/coordinating care for liver lesion biopsy

## 2019-08-11 NOTE — Discharge Instructions (Addendum)
Liver Biopsy, Care After °These instructions give you information about how to care for yourself after your procedure. Your health care provider may also give you more specific instructions. If you have problems or questions, contact your health care provider. °What can I expect after the procedure? °After your procedure, it is common to have: °· Pain and soreness in the area where the biopsy was done. °· Bruising around the area where the biopsy was done. °· Sleepiness and fatigue for 1-2 days. °Follow these instructions at home: °Medicines °· Take over-the-counter and prescription medicines only as told by your health care provider. °· If you were prescribed an antibiotic medicine, take it as told by your health care provider. Do not stop taking the antibiotic even if you start to feel better. °· Do not take medicines such as aspirin and ibuprofen unless your health care provider tells you to take them. These medicines thin your blood and can increase the risk of bleeding. °· If you are taking prescription pain medicine, take actions to prevent or treat constipation. Your health care provider may recommend that you: °? Drink enough fluid to keep your urine pale yellow. °? Eat foods that are high in fiber, such as fresh fruits and vegetables, whole grains, and beans. °? Limit foods that are high in fat and processed sugars, such as fried or sweet foods. °? Take an over-the-counter or prescription medicine for constipation. °Incision care °· Follow instructions from your health care provider about how to take care of your incision. Make sure you: °? Wash your hands with soap and water before you change your bandage (dressing). If soap and water are not available, use hand sanitizer. °? Change your dressing as told by your health care provider. °? Leave stitches (sutures), skin glue, or adhesive strips in place. These skin closures may need to stay in place for 2 weeks or longer. If adhesive strip edges start to  loosen and curl up, you may trim the loose edges. Do not remove adhesive strips completely unless your health care provider tells you to do that. °· Check your incision area every day for signs of infection. Check for: °? Redness, swelling, or pain. °? Fluid or blood. °? Warmth. °? Pus or a bad smell. °· Do not take baths, swim, or use a hot tub until your health care provider says it is okay to do so. °Activity ° °· Rest at home for 1-2 days, or as directed by your health care provider. °? Avoid sitting for a long time without moving. Get up to take short walks every 1-2 hours. This is important to improve blood flow and breathing. Ask for help if you feel weak or unsteady. °· Return to your normal activities as told by your health care provider. Ask your health care provider what activities are safe for you. °· Do not drive or use heavy machinery while taking prescription pain medicine. °· Do not lift anything that is heavier than 10 lb (4.5 kg), or the limit that your health care provider tells you, until he or she says that it is safe. °· Do not play contact sports for 2 weeks after the procedure. °General instructions ° °· Do not drink alcohol in the first week after the procedure. °· Have someone stay with you for at least 24 hours after the procedure. °· It is your responsibility to obtain your test results. Ask your health care provider, or the department that is doing the test: °? When will my   results be ready? °? How will I get my results? °? What are my treatment options? °? What other tests do I need? °? What are my next steps? °· Keep all follow-up visits as told by your health care provider. This is important. °Contact a health care provider if: °· You have increased bleeding from an incision, resulting in more than a small spot of blood. °· You have redness, swelling, or increasing pain in any incisions. °· You notice a discharge or a bad smell coming from any of your incisions. °· You have a fever or  chills. °Get help right away if: °· You develop swelling, bloating, or pain in your abdomen. °· You become dizzy or faint. °· You develop a rash. °· You have nausea or you vomit. °· You faint, or you have shortness of breath or difficulty breathing. °· You develop chest pain. °· You have problems with your speech or vision. °· You have trouble with your balance or moving your arms or legs. °Summary °· After the liver biopsy, it is common to have pain, soreness, and bruising in the area, as well as sleepiness and fatigue. °· Take over-the-counter and prescription medicines only as told by your health care provider. °· Follow instructions from your health care provider about how to care for your incision. Check the incision area daily for signs of infection. °This information is not intended to replace advice given to you by your health care provider. Make sure you discuss any questions you have with your health care provider. °Document Released: 05/26/2005 Document Revised: 12/30/2018 Document Reviewed: 11/16/2017 °[employer]vier Patient Education © 2020 [employer] °Moderate Conscious Sedation, Adult, Care After °These instructions provide you with information about caring for yourself after your procedure. Your health care provider may also give you more specific instructions. Your treatment has been planned according to current medical practices, but problems sometimes occur. Call your health care provider if you have any problems or questions after your procedure. °What can I expect after the procedure? °After your procedure, it is common: °· To feel sleepy for several hours. °· To feel clumsy and have poor balance for several hours. °· To have poor judgment for several hours. °· To vomit if you eat too soon. °Follow these instructions at home: °For at least 24 hours after the procedure: ° °· Do not: °? Participate in activities where you could fall or become injured. °? Drive. °? Use heavy machinery. °? Drink  alcohol. °? Take sleeping pills or medicines that cause drowsiness. °? Make important decisions or sign legal documents. °? Take care of children on your own. °· Rest. °Eating and drinking °· Follow the diet recommended by your health care provider. °· If you vomit: °? Drink water, juice, or soup when you can drink without vomiting. °? Make sure you have little or no nausea before eating solid foods. °General instructions °· Have a responsible adult stay with you until you are awake and alert. °· Take over-the-counter and prescription medicines only as told by your health care provider. °· If you smoke, do not smoke without supervision. °· Keep all follow-up visits as told by your health care provider. This is important. °Contact a health care provider if: °· You keep feeling nauseous or you keep vomiting. °· You feel light-headed. °· You develop a rash. °· You have a fever. °Get help right away if: °· You have trouble breathing. °This information is not intended to replace advice given to you by your health   care provider. Make sure you discuss any questions you have with your health care provider. °Document Released: 08/27/2013 Document Revised: 10/19/2017 Document Reviewed: 02/26/2016 °Elsevier Patient Education © 2020 [employer] ° °

## 2019-08-12 LAB — SURGICAL PATHOLOGY

## 2019-08-13 ENCOUNTER — Other Ambulatory Visit: Payer: Self-pay

## 2019-08-13 ENCOUNTER — Telehealth: Payer: Self-pay | Admitting: Gastroenterology

## 2019-08-13 DIAGNOSIS — K769 Liver disease, unspecified: Secondary | ICD-10-CM

## 2019-08-14 NOTE — Telephone Encounter (Signed)
Called IR at [facility] where patient had liver biopsy on 08/11/19 and spoke to a PA, there is no reason she is aware of that patient would have been told to Hold his Asprin and Phenergan. Said to have patient start back on it

## 2019-08-14 NOTE — Telephone Encounter (Signed)
Wife of patient called and states when her husband had his Liver biopsy on 08/11/19 they told him to Hold his Asprin and Promethazine until advised by his ordering MD to start back. Please advise

## 2019-08-14 NOTE — Telephone Encounter (Signed)
Esther the biopsy was done by IR, I defer to them when he resumes the aspirin, I'm not sure what their protocol is. He should call their office. Thanks

## 2019-08-15 ENCOUNTER — Other Ambulatory Visit (INDEPENDENT_AMBULATORY_CARE_PROVIDER_SITE_OTHER): Payer: Medicare HMO

## 2019-08-15 DIAGNOSIS — K769 Liver disease, unspecified: Secondary | ICD-10-CM

## 2019-08-15 LAB — BASIC METABOLIC PANEL
BUN: 13 mg/dL (ref 6–23)
CO2: 26 mEq/L (ref 19–32)
Calcium: 9.1 mg/dL (ref 8.4–10.5)
Chloride: 104 mEq/L (ref 96–112)
Creatinine, Ser: 1.02 mg/dL (ref 0.40–1.50)
GFR: 71.9 mL/min (ref >60.00)
Glucose, Bld: 105 mg/dL — ABNORMAL HIGH (ref 70–99)
Potassium: 3.6 mEq/L (ref 3.5–5.1)
Sodium: 138 mEq/L (ref 135–145)

## 2019-08-18 ENCOUNTER — Ambulatory Visit (HOSPITAL_COMMUNITY)
Admission: RE | Admit: 2019-08-18 | Discharge: 2019-08-18 | Disposition: A | Discharge status: Home / Self Care | Payer: Medicare HMO | Source: Ambulatory Visit | Attending: Gastroenterology | Admitting: Gastroenterology

## 2019-08-18 ENCOUNTER — Telehealth: Payer: Self-pay

## 2019-08-18 ENCOUNTER — Other Ambulatory Visit: Payer: Self-pay

## 2019-08-18 DIAGNOSIS — R918 Other nonspecific abnormal finding of lung field: Secondary | ICD-10-CM | POA: Insufficient documentation

## 2019-08-18 DIAGNOSIS — K769 Liver disease, unspecified: Secondary | ICD-10-CM | POA: Insufficient documentation

## 2019-08-18 DIAGNOSIS — I700 Atherosclerosis of aorta: Secondary | ICD-10-CM | POA: Insufficient documentation

## 2019-08-18 DIAGNOSIS — C229 Malignant neoplasm of liver, not specified as primary or secondary: Secondary | ICD-10-CM | POA: Diagnosis not present

## 2019-08-18 IMAGING — CT CT CHEST W/ CM
2 of 3 series · 15 of 36 positions shown, 18 images · IV contrast (omnipaque)
Comparison: Abdominal CT and MR from [facility] in [DATE]

CLINICAL DATA: Follow-up for biopsy proven hepatic neoplasm.

EXAM:
CT CHEST WITH CONTRAST
TECHNIQUE: Multidetector CT imaging of the chest was performed during
intravenous contrast administration.
CONTRAST:  75mL OMNIPAQUE IOHEXOL 300 MG/ML  SOLN

[Series 2: axial st · axial · 0.74mm/px · z∈[-420,-144]mm · 12 of 164 slices shown, 15 images]
[im 13/164  mediastinal]
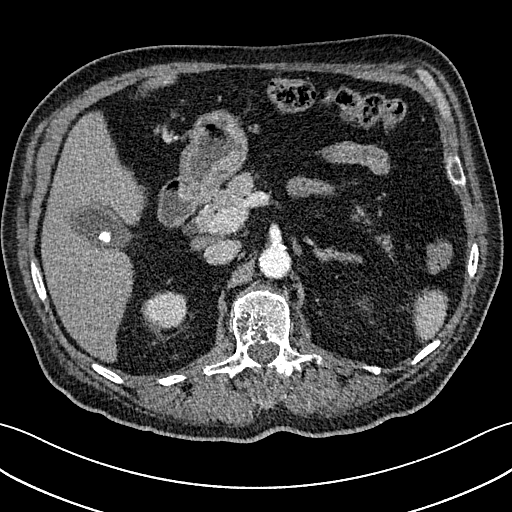
[im 13/164  lung]
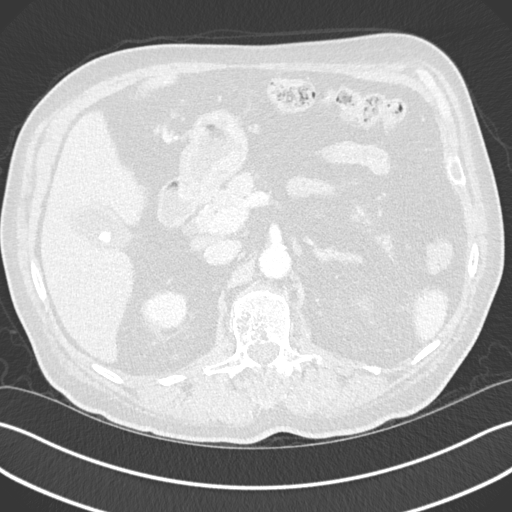
[im 25/164  lung]
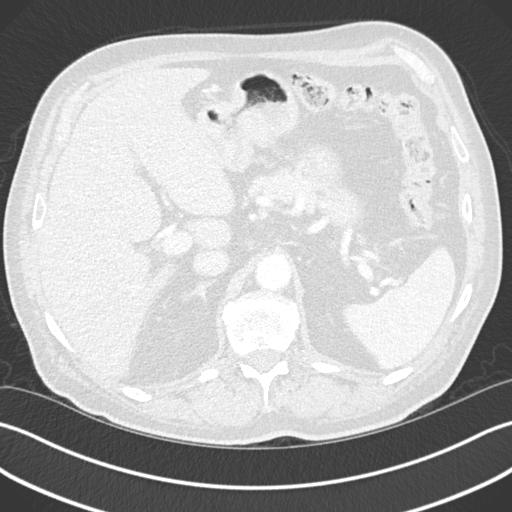
[im 37/164  lung]
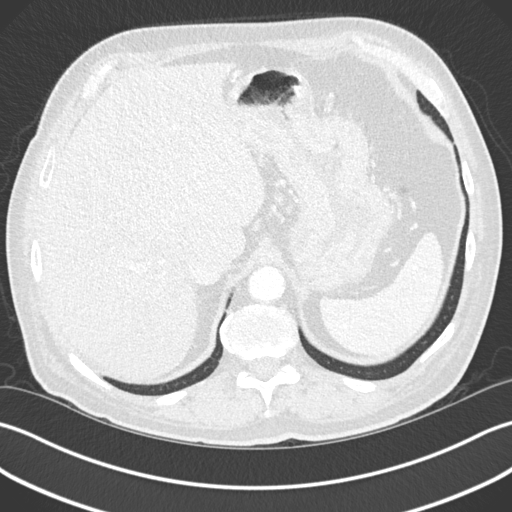
[im 49/164  lung]
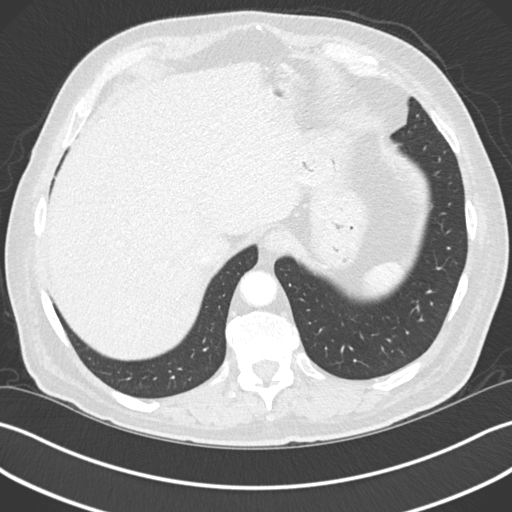
[im 61/164  mediastinal]
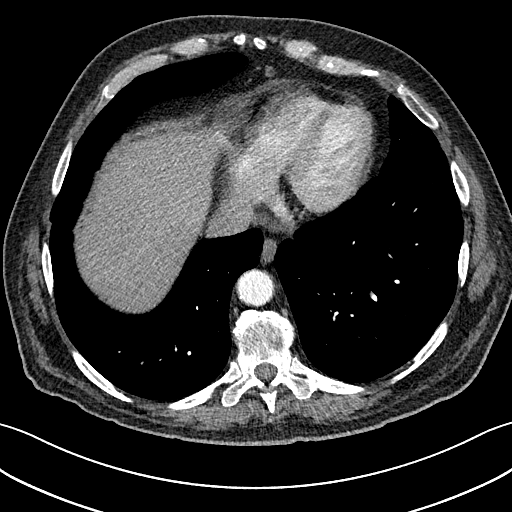
[im 61/164  lung]
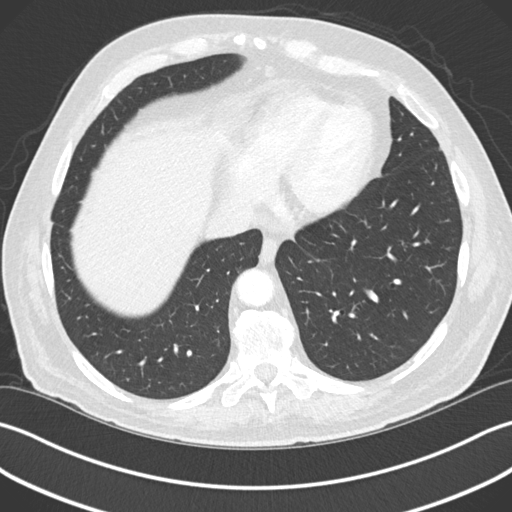
[im 73/164  lung]
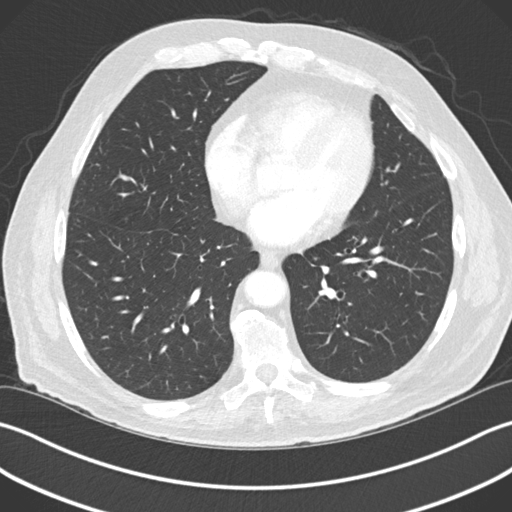
[im 91/164  lung]
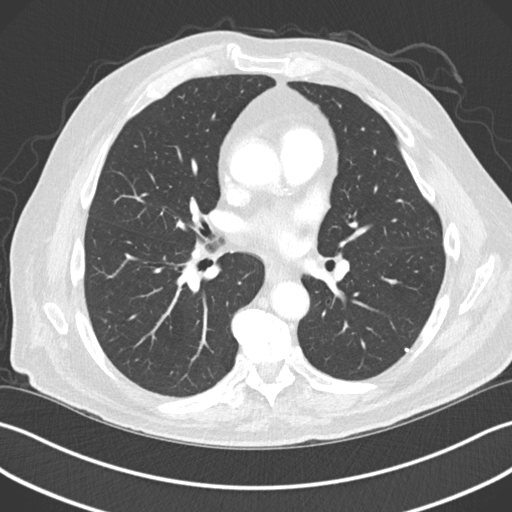
[im 103/164  lung]
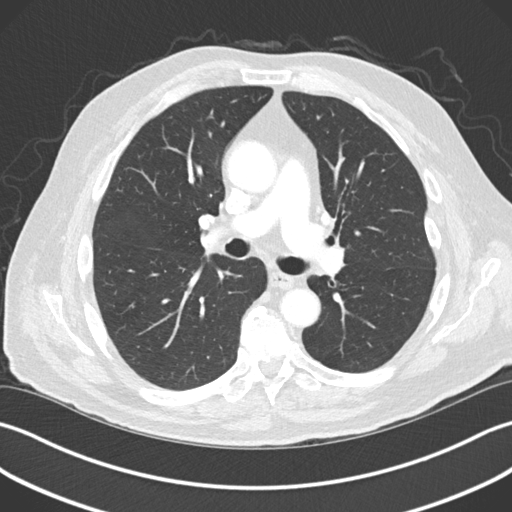
[im 115/164  mediastinal]
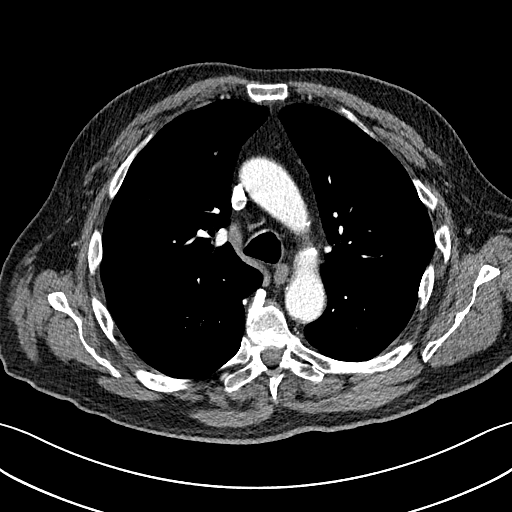
[im 115/164  lung]
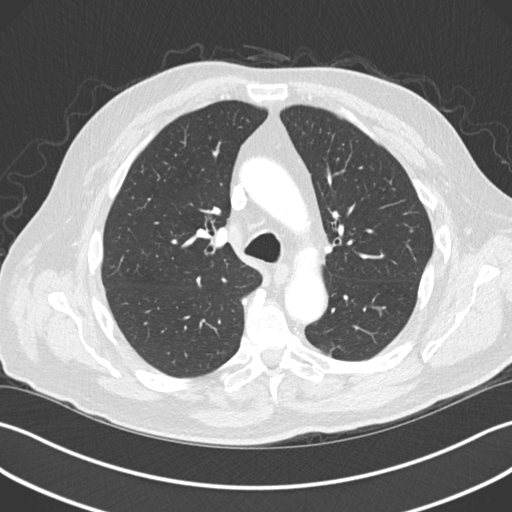
[im 127/164  lung]
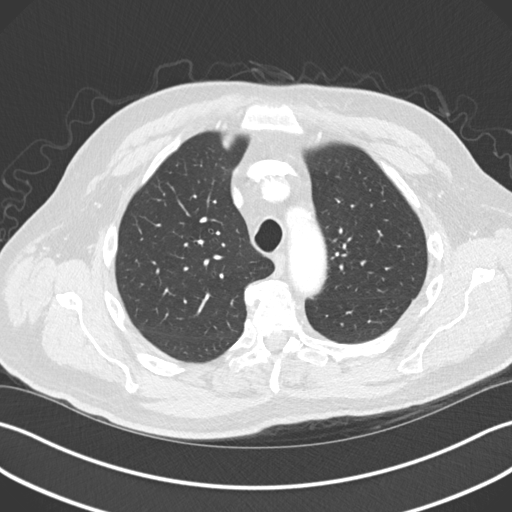
[im 139/164  lung]
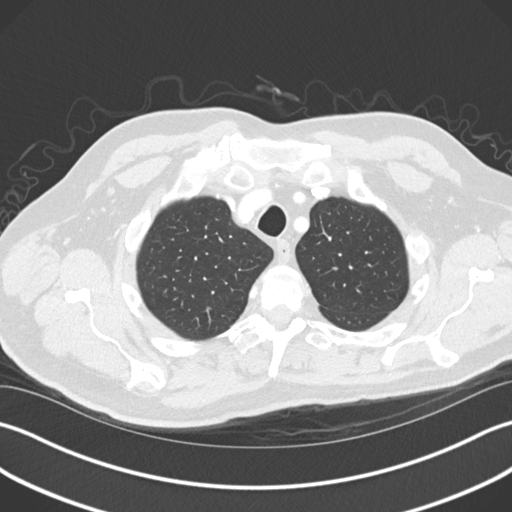
[im 151/164  lung]
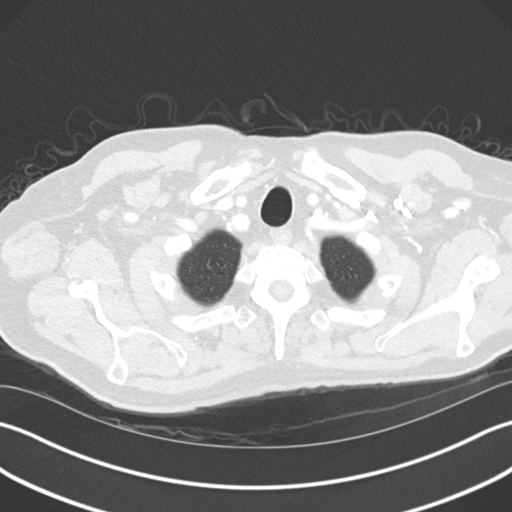

[Series 6: coronal · coronal · 0.68mm/px · 3 of 161 slices shown]
[im 33/161  lung]
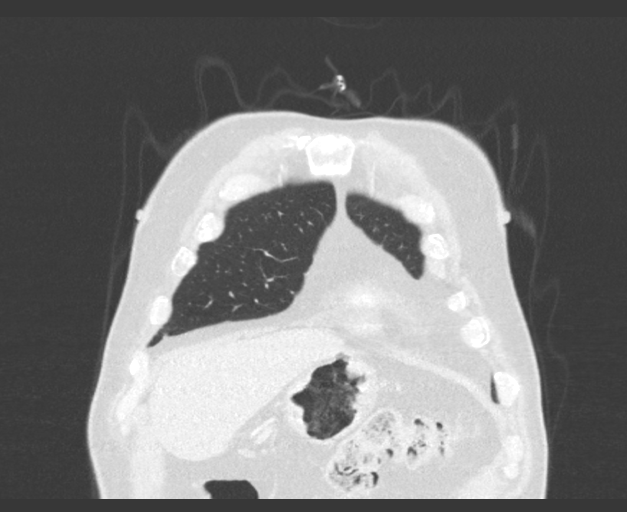
[im 65/161  lung]
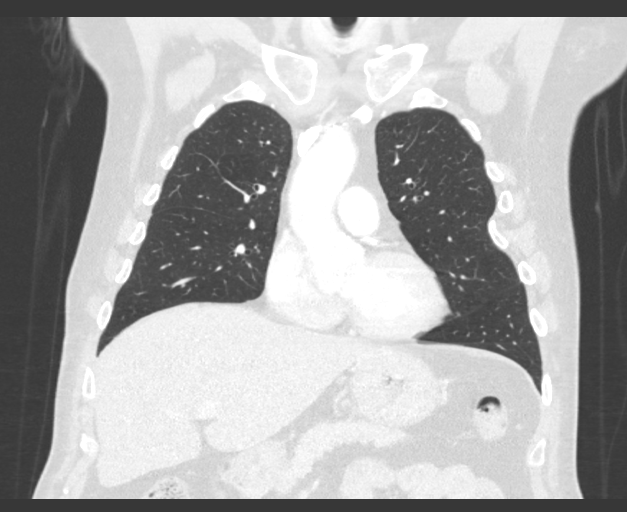
[im 97/161  lung]
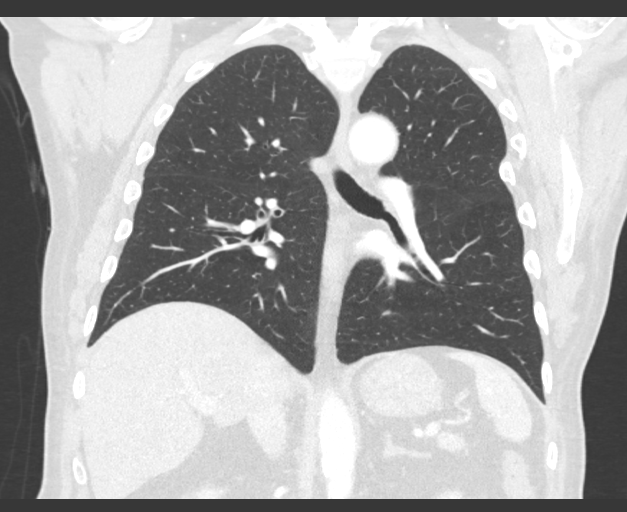

[15 of 36 positions shown; findings below may reference images not displayed]

FINDINGS: Cardiovascular: Signs of coronary artery disease. Generalized
atherosclerosis. No aneurysm. Heart size normal. Central pulmonary
vasculature is unremarkable.

Mediastinum/Nodes: No mediastinal or hilar lymphadenopathy.

Lungs/Pleura: Small 5 mm nodules in the right middle lobe (image 77,
series 3) (image 97, series 3) the most inferior these nodules is
not changed since the previous exam. There is suggestion of subtle
calcification associated with the more inferiorly located these 2
nodules is well. 3 mm right lower lobe pulmonary nodule unchanged.

Most concerning nodule in the chest is a right lower lobe nodule
measuring approximately 7 mm (image 64, series 3) numerous other
tiny nodules scattered throughout the left chest. No pleural
effusion.

Upper Abdomen: Mass in the right hemi liver with atrophy of blood
hepatic lobe similar to prior studies.

Cholelithiasis.

Small but numerous lymph nodes throughout the hepatic gastric
ligament. Mildly enlarged periportal lymph node measures 1.5 cm in
short axis. Adrenal glands are unremarkable.

Musculoskeletal: Spinal degenerative changes without acute or
destructive bone process.
IMPRESSION: 1. Multiple pulmonary nodules largest at approximately 7 mm in the
right lower lobe, nonspecific but concerning given findings in the
liver.
2. No signs of definitive metastatic disease, also with mildly
enlarged upper abdominal lymph nodes as discussed.

Aortic Atherosclerosis ([IJ]-[IJ]).

## 2019-08-18 MED ORDER — SODIUM CHLORIDE (PF) 0.9 % IJ SOLN
INTRAMUSCULAR | Status: AC
  Filled 2019-08-18: qty 50

## 2019-08-18 MED ORDER — IOHEXOL 300 MG/ML  SOLN
75.0000 mL | Freq: Once | INTRAMUSCULAR | Status: AC | PRN
  Administered 2019-08-18: 16:00:00 75 mL via INTRAVENOUS

## 2019-08-18 NOTE — Telephone Encounter (Signed)
Called and left VM to advise of appointment with Dr. Burr Medico on 9/30 @ 1 PM. Requested that he all back to confirm and to review details of appointment.

## 2019-08-19 ENCOUNTER — Other Ambulatory Visit: Payer: Self-pay | Admitting: Gastroenterology

## 2019-08-19 ENCOUNTER — Telehealth: Payer: Self-pay | Admitting: Gastroenterology

## 2019-08-19 DIAGNOSIS — C221 Intrahepatic bile duct carcinoma: Secondary | ICD-10-CM | POA: Insufficient documentation

## 2019-08-19 NOTE — Progress Notes (Signed)
[place]   Telephone:([phone] Fax:([phone] [place] Note   Patient Care Team: [name], [place] as PCP - General (Family Medicine)  Date of Service:  08/20/2019   CHIEF COMPLAINTS/PURPOSE OF CONSULTATION:  Newly diagnosed Liver cancer  REFERRING PHYSICIAN:  Dr. Havery Moros  Oncology History  Intrahepatic cholangiocarcinoma ([place])  06/28/2019 Imaging   CT AP W Contrast 06/28/19  IMPRESSION: 1. Heterogeneous hypodensity posteriorly in the right hepatic lobe and potentially extending into the caudate lobe suspicious for a mass. There is felt to be truncation of branches of the portal vein in this vicinity and some narrowing of the hepatic vein, as well as triangular-shaped regions of abnormal hypoenhancement posteriorly in the right hepatic lobe likely representing downstream vascular effects. Cannot exclude malignancy such as cholangiocarcinoma or hepatocellular carcinoma, and follow up hepatic protocol MRI with and without contrast is recommended to further characterize. 2. 4 mm right middle lobe pulmonary nodule is likely benign but may merit surveillance. 3. Cholelithiasis. 4.  Aortic Atherosclerosis (ICD10-I70.0). 5. Prostatomegaly. 6. Mild impingement at L3-4 and L4-5.   07/31/2019 Imaging   MRI Liver 07/31/19 IMPRESSION: 1. 7.3 cm in long axis mass in the right hepatic lobe spanning into the caudate lobe, high suspicion for malignancy such as hepatocellular carcinoma or cholangiocarcinoma. Suspected effacement or occlusion of the right hepatic vein and posterior branches of the right portal vein. Two smaller tumor nodules along the posterior periphery of the dominant mass. Tissue diagnosis is recommended. 2. No findings of pathologic adenopathy or distant metastatic spread. 3. 9 mm gallstone in the gallbladder. There is mild gallbladder wall thickening which may be from nondistention, correlate clinically in assessing for  cholecystitis. 4.  Aortic Atherosclerosis (ICD10-I70.0). 5. Mild diffuse hepatic steatosis.   08/11/2019 Initial Biopsy   DIAGNOSIS: 08/11/19  A. LIVER, RIGHT, BIOPSY:  - Adenocarcinoma.   08/18/2019 Imaging   CT Chest 08/18/19  IMPRESSION: 1. Multiple pulmonary nodules largest at approximately 7 mm in the right lower lobe, nonspecific but concerning given findings in the liver. 2. No signs of definitive metastatic disease, also with mildly enlarged upper abdominal lymph nodes as discussed.   Aortic Atherosclerosis (ICD10-I70.0).   08/19/2019 Initial Diagnosis   Intrahepatic cholangiocarcinoma ([place])   08/19/2019 Cancer Staging   Staging form: Intrahepatic Bile Duct, AJCC 8th Edition - Clinical stage from 08/19/2019: Stage II (cT2, cN0, cM0) - Signed by [name], MD on 08/19/2019      HISTORY OF PRESENTING ILLNESS:  Brandon Sherman 73 y.o. male is a here because of newly diagnosed liver cancer. The patient was referred by Dr. Havery Moros. The patient presents to the clinic today with his wife.   For 3 months he has had acid reflux pain. He was seen by Dr. Havery Moros who did work up and biopsy which showed cancer. This improved with antiacid Pepcid and sucralfate. His pain will be 5/10 and occurs now intermittently and no longer has nausea. He denies abdominal bloating. He notes he initially could not eat well until sucralfate. He did loose 30 pounds. He now eats better. He denies chest pain, vision change, no Cough. He notes normal urination and BMs. He get gout in left knee which an cause LE edema.   Socially he is married with 3 adult children. He notes he used to work at [employer] and retired 3 years ago. He notes since then he does house and yardwork. He can still be active. He use to drink socially  and now no longer drinks. He is a non smoker. He notes he still chews [place] for the past 60 years. He chews now 1-2 times a day.   They have a PMHx of DM (in 77-100 range),  HTN. He had appendectomy. He denies family Hx of cancer.  He is on Metformin, amlodipine, lisinopril., allopurinol, Proscar for his prostate.    REVIEW OF SYSTEMS:    Constitutional: Denies fevers, chills or abnormal night sweats (+) improved eating and appetite  Eyes: Denies blurriness of vision, double vision or watery eyes Ears, nose, mouth, throat, and face: Denies mucositis or sore throat Respiratory: Denies cough, dyspnea or wheezes Cardiovascular: Denies palpitation, chest discomfort or lower extremity swelling Gastrointestinal:  Denies nausea, heartburn (+) improved GERD, 5/10 pain Skin: Denies abnormal skin rashes Lymphatics: Denies new lymphadenopathy or easy bruising Neurological:Denies numbness, tingling or new weaknesses Behavioral/Psych: Mood is stable, no new changes  All other systems were reviewed with the patient and are negative.   MEDICAL HISTORY:  Past Medical History:  Diagnosis Date   Arthritis    Diabetes ([place])    GERD (gastroesophageal reflux disease)    Hyperlipidemia    Hypertension     SURGICAL HISTORY: Past Surgical History:  Procedure Laterality Date   APPENDECTOMY  1980   COLONOSCOPY      SOCIAL HISTORY: Social History   Socioeconomic History   Marital status: Married    Spouse name: Not on file   Number of children: 3   Years of education: Not on file   Highest education level: Not on file  Occupational History   Not on file  Social Needs   Financial resource strain: Not on file   Food insecurity    Worry: Not on file    Inability: Not on file   Transportation needs    Medical: Not on file    Non-medical: Not on file  Tobacco Use   Smoking status: Never Smoker   Smokeless tobacco: Current User    Types: Chew   Tobacco comment: for 60 years, 1-2 daily   Substance and Sexual Activity   Alcohol use: Not Currently    Comment: occasionally   Drug use: Never   Sexual activity: Not on file  Lifestyle    Physical activity    Days per week: Not on file    Minutes per session: Not on file   Stress: Not on file  Relationships   Social connections    Talks on phone: Not on file    Gets together: Not on file    Attends religious service: Not on file    Active member of club or organization: Not on file    Attends meetings of clubs or organizations: Not on file    Relationship status: Not on file   Intimate partner violence    Fear of current or ex partner: Not on file    Emotionally abused: Not on file    Physically abused: Not on file    Forced sexual activity: Not on file  Other Topics Concern   Not on file  Social History Narrative   Not on file    FAMILY HISTORY: Family History  Problem Relation Age of Onset   Heart attack Mother    Stroke Father    Colon cancer Neg Hx    Esophageal cancer Neg Hx    Stomach cancer Neg Hx    Rectal cancer Neg Hx    Colon polyps Neg Hx  ALLERGIES:  has No Known Allergies.  MEDICATIONS:  Current Outpatient Medications  Medication Sig Dispense Refill   allopurinol (ZYLOPRIM) 100 MG tablet Take 100 mg by mouth daily.     amLODipine (NORVASC) 2.5 MG tablet Take 2.5 mg by mouth daily.     aspirin EC 81 MG tablet Take 81 mg by mouth daily.     famotidine (PEPCID) 40 MG tablet Take 0.5 tablets (20 mg total) by mouth 2 (two) times daily. 30 tablet 5   fenofibrate (TRICOR) 145 MG tablet Take 145 mg by mouth daily.     finasteride (PROSCAR) 5 MG tablet Take 5 mg by mouth daily.     lisinopril (ZESTRIL) 40 MG tablet Take 40 mg by mouth daily.     metFORMIN (GLUCOPHAGE) 500 MG tablet Take 500 mg by mouth 2 (two) times daily with a meal.      ondansetron (ZOFRAN) 4 MG tablet Take 4 mg by mouth every 8 (eight) hours as needed for nausea or vomiting.     pantoprazole (PROTONIX) 40 MG tablet Take 1 tablet (40 mg total) by mouth 2 (two) times daily. 60 tablet 2   pravastatin (PRAVACHOL) 40 MG tablet Take 40 mg by mouth daily.      promethazine (PHENERGAN) 12.5 MG tablet Take 1 tablet (12.5 mg total) by mouth every 6 (six) hours as needed for nausea or vomiting. 20 tablet 1   sucralfate (CARAFATE) 1 g tablet Take 1 tablet (1 g total) by mouth 4 (four) times daily -  with meals and at bedtime. 90 tablet 1   tamsulosin (FLOMAX) 0.4 MG CAPS capsule Take 0.4 mg by mouth daily.     Vitamin D, Ergocalciferol, (DRISDOL) 1.25 MG (50000 UT) CAPS capsule Take 50,000 Units by mouth every 7 (seven) days.     No current facility-administered medications for this visit.     PHYSICAL EXAMINATION: ECOG PERFORMANCE STATUS: 1 - Symptomatic but completely ambulatory  Vitals:   08/20/19 1317  BP: (!) 156/69  Pulse: 65  Resp: 18  Temp: 98.3 F (36.8 C)  SpO2: 98%   Filed Weights   08/20/19 1317  Weight: 213 lb 1.6 oz (96.7 kg)    GENERAL:alert, no distress and comfortable SKIN: skin color, texture, turgor are normal, no rashes or significant lesions EYES: normal, Conjunctiva are pink and non-injected, sclera clear  NECK: supple, thyroid normal size, non-tender, without nodularity LYMPH:  no palpable lymphadenopathy in the cervical, axillary  LUNGS: clear to auscultation and percussion with normal breathing effort HEART: regular rate & rhythm and no murmurs and no lower extremity edema ABDOMEN:abdomen soft, non-tender and normal bowel sounds Musculoskeletal:no cyanosis of digits and no clubbing  NEURO: alert & oriented x 3 with fluent speech, no focal motor/sensory deficits  LABORATORY DATA:  I have reviewed the data as listed CBC Latest Ref Rng & Units 08/11/2019 06/26/2019  WBC 4.0 - 10.5 K/uL 9.8 10.3  Hemoglobin 13.0 - 17.0 g/dL 16.1 14.4  Hematocrit 39.0 - 52.0 % 47.7 43.7  Platelets 150 - 400 K/uL 322 286.0    CMP Latest Ref Rng & Units 08/15/2019 08/06/2019 06/26/2019  Glucose 70 - 99 mg/dL 105(H) - 106(H)  BUN 6 - 23 mg/dL 13 - 14  Creatinine 0.40 - 1.50 mg/dL 1.02 - 0.88  Sodium 135 - 145 mEq/L 138 - 140    Potassium 3.5 - 5.1 mEq/L 3.6 - 3.6  Chloride 96 - 112 mEq/L 104 - 109  CO2 19 - 32 mEq/L 26 -  23  Calcium 8.4 - 10.5 mg/dL 9.1 - 8.6  Total Protein 6.0 - 8.3 g/dL - 5.3(L) 4.7(L)  Total Bilirubin 0.2 - 1.2 mg/dL - 0.4 0.3  Alkaline Phos 39 - 117 U/L - 37(L) 28(L)  AST 0 - 37 U/L - 16 16  ALT 0 - 53 U/L - 14 16   PROCEDURES:   Colonoscopy by Dr. Havery Moros 06/03/19  IMPRESSION - Two 3 to 5 mm polyps in the ascending colon, removed with a cold snare. Resected and retrieved. - One 4 mm polyp in the transverse colon, removed with a cold snare. Resected and retrieved. - Internal hemorrhoids. - The examination was otherwise normal. Upper Endoscopy  IMPRESSION - Esophagogastric landmarks identified. - Esophageal plaques were found, consistent with candidiasis. - No evidence of Barrett's - Altered patchy mucosa in the stomach as outlined, biopsied extensively. - Normal duodenal bulb and second portion of the duodenum. Diagnosis 1. Surgical [P], distal esophagus - GASTRIC/CARDIAC MUCOSA WITH ACUTE ACTIVE CHRONIC NONSPECIFIC INFLAMMATION - NEGATIVE FOR INTESTINAL METAPLASIA OR DYSPLASIA - WARTHIN-STARRY STAIN IS NEGATIVE FOR HELICOBACTER PYLORI 2. Surgical [P], proximal esophagus - GASTRIC/CARDIAC MUCOSA WITH ACUTE ACTIVE CHRONIC NONSPECIFIC INFLAMMATION - NEGATIVE FOR INTESTINAL METAPLASIA OR DYSPLASIA - WARTHIN-STARRY STAIN IS NEGATIVE FOR HELICOBACTER PYLORI 3. Surgical [P], colon, transverse and ascending, polyp (3) - TUBULAR ADENOMA WITHOUT HIGH-GRADE DYSPLASIA OR MALIGNANCY - SESSILE SERRATED POLYP(S) WITHOUT CYTOLOGIC DYSPLASIA    Upper Endoscopy by Dr Havery Moros 08/06/19 IMPRESSION - Esophagogastric landmarks identified. - Normal esophagus otherwise. - Gastritis / nodular atypical appearing mucosa throughout the entire stomach - no significant improvement on twice daily PPI, unclear etiology. Biopsied. - Normal duodenal bulb and second portion of the  duodenum. Diagnosis Surgical [P], gastric antrum and gastric body - MARKED REACTIVE GASTROPATHY WITH FOVEOLAR HYPERPLASIA. - FOCAL NEUROENDOCRINE PROLIFERATION. - SEE COMMENT.   RADIOGRAPHIC STUDIES: I have personally reviewed the radiological images as listed and agreed with the findings in the report. Ct Chest W Contrast  Result Date: 08/18/2019 CLINICAL DATA:  Follow-up for biopsy proven hepatic neoplasm. EXAM: CT CHEST WITH CONTRAST TECHNIQUE: Multidetector CT imaging of the chest was performed during intravenous contrast administration. CONTRAST:  8mL OMNIPAQUE IOHEXOL 300 MG/ML  SOLN COMPARISON:  Abdominal CT and MR from August in September of 2020 FINDINGS: Cardiovascular: Signs of coronary artery disease. Generalized atherosclerosis. No aneurysm. Heart size normal. Central pulmonary vasculature is unremarkable. Mediastinum/Nodes: No mediastinal or hilar lymphadenopathy. Lungs/Pleura: Small 5 mm nodules in the right middle lobe (image 77, series 3) (image 97, series 3) the most inferior these nodules is not changed since the previous exam. There is suggestion of subtle calcification associated with the more inferiorly located these 2 nodules is well. 3 mm right lower lobe pulmonary nodule unchanged. Most concerning nodule in the chest is a right lower lobe nodule measuring approximately 7 mm (image 64, series 3) numerous other tiny nodules scattered throughout the left chest. No pleural effusion. Upper Abdomen: Mass in the right hemi liver with atrophy of blood hepatic lobe similar to prior studies. Cholelithiasis. Small but numerous lymph nodes throughout the hepatic gastric ligament. Mildly enlarged periportal lymph node measures 1.5 cm in short axis. Adrenal glands are unremarkable. Musculoskeletal: Spinal degenerative changes without acute or destructive bone process. IMPRESSION: 1. Multiple pulmonary nodules largest at approximately 7 mm in the right lower lobe, nonspecific but concerning  given findings in the liver. 2. No signs of definitive metastatic disease, also with mildly enlarged upper abdominal lymph nodes as discussed. Aortic Atherosclerosis (ICD10-I70.0).  Electronically Signed   By: Zetta Bills M.D.   On: 08/18/2019 17:19   Mr Liver W Wo Contrast  Result Date: 08/01/2019 CLINICAL DATA:  Right hepatic lobe lesion for further characterization. EXAM: MRI ABDOMEN WITHOUT AND WITH CONTRAST TECHNIQUE: Multiplanar multisequence MR imaging of the abdomen was performed both before and after the administration of intravenous contrast. CONTRAST:  3mL GADAVIST GADOBUTROL 1 MMOL/ML IV SOLN COMPARISON:  CT abdomen 07/18/2019 FINDINGS: Lower chest: Unremarkable Hepatobiliary: In the right hepatic lobe and spanning into the caudate lobe and mostly surrounding the intrahepatic portion of the IVC, we demonstrate a 7.3 by 5.6 by 5.0 cm (volume = 110 cm^3) mass with high T2 and low T1 signal, restriction of diffusion, multilobular margins, irregular marginal enhancement on portal venous phase images, and delayed primarily marginal enhancement. Posterior to this mass in the right hepatic lobe, there is a separate 1.1 cm tumor nodule on image 13/3 and a separate 1.1 by 0.8 cm tumor nodule on image 15/3. There is poor definition of the right hepatic vein which is thought to be effaced or occluded. Right posterior branches of the portal vein are likely occluded for example on image 39/903. 9 mm gallstone in the gallbladder. Mild gallbladder wall thickening. No biliary dilatation. Minimal diffuse hepatic steatosis. Pancreas:  Unremarkable Spleen:  Unremarkable Adrenals/Urinary Tract: Small cysts in the upper poles of both kidneys. Stomach/Bowel: Unremarkable Vascular/Lymphatic: As noted above, the tumor with its epicenter in the right hepatic lobe is likely effacing or occluding the right hepatic vein and posterior branches of the right portal vein. Well-defined tumor thrombus is not seen but might be  hidden by the main tumor mass. Aortoiliac atherosclerotic vascular disease. No definite pathologic adenopathy is currently identified. Other:  No supplemental non-categorized findings. Musculoskeletal: Hemangiomas are observed in the L1 and L4 vertebral bodies. IMPRESSION: 1. 7.3 cm in long axis mass in the right hepatic lobe spanning into the caudate lobe, high suspicion for malignancy such as hepatocellular carcinoma or cholangiocarcinoma. Suspected effacement or occlusion of the right hepatic vein and posterior branches of the right portal vein. Two smaller tumor nodules along the posterior periphery of the dominant mass. Tissue diagnosis is recommended. 2. No findings of pathologic adenopathy or distant metastatic spread. 3. 9 mm gallstone in the gallbladder. There is mild gallbladder wall thickening which may be from nondistention, correlate clinically in assessing for cholecystitis. 4.  Aortic Atherosclerosis (ICD10-I70.0). 5. Mild diffuse hepatic steatosis. Electronically Signed   By: Van Clines M.D.   On: 08/01/2019 09:05   US Biopsy (liver)  Result Date: 08/11/2019 INDICATION: 73 year old with a suspicious hepatic lesion and needs a tissue diagnosis. EXAM: ULTRASOUND-GUIDED LIVER LESION BIOPSY MEDICATIONS: None. ANESTHESIA/SEDATION: Moderate (conscious) sedation was employed during this procedure. A total of Versed 2.0 mg and Fentanyl 100 mcg was administered intravenously. Moderate Sedation Time: 26 minutes. The patient's level of consciousness and vital signs were monitored continuously by radiology nursing throughout the procedure under my direct supervision. FLUOROSCOPY TIME:  None COMPLICATIONS: None immediate. PROCEDURE: Informed written consent was obtained from the patient after a thorough discussion of the procedural risks, benefits and alternatives. All questions were addressed. Maximal Sterile Barrier Technique was utilized including caps, mask, sterile gowns, sterile gloves, sterile  drape, hand hygiene and skin antiseptic. A timeout was performed prior to the initiation of the procedure. Liver was evaluated with ultrasound. Subtle hyperechoic lesion was identified in the central right hepatic lobe. The right side of the abdomen was prepped with chlorhexidine and sterile  field was created. Skin and soft tissues were anesthetized with 1% lidocaine. 17 gauge needle was directed into the right hepatic lesion using ultrasound guidance. Core biopsies were obtained with an 18 gauge core device. Specimens placed in formalin. Needle was removed without complication. Bandage placed over the puncture site. FINDINGS: Subtle hyperechoic lesion in the central right hepatic lobe near the IVC. Margins of this lesion are ill-defined but the location of heterogeneity corresponds with the lesion seen on the prior cross-sectional imaging. Core biopsies were obtained at the area of concern. No significant bleeding or hematoma formation following the core biopsies. IMPRESSION: Ultrasound-guided core biopsies of the poorly defined right hepatic lesion. If the ultrasound-guided core biopsy is inconclusive, we could consider a repeat biopsy with CT guidance. Electronically Signed   By: Markus Daft M.D.   On: 08/11/2019 17:10    ASSESSMENT & PLAN:  STEPHEN RISTINE is a 59 y.o. Caucasian male with a history of DM, GERD, Gout, HLD, HTN  1. Intrahepatic cholangiocarcinoma, cT2N0Mx, with indeterminate lung nodules  -We discussed his Image findings and biopsy results in great detail with patient and his wife.  -CT scans and MRI liver show a large 7.3cm mass in the right hepatic lobe which abuts portal vein. This is localized with no overt metastatic disease in abdomen and Pelvis. His liver biopsy shows adenocarcinoma, favor cholangiocarcinoma. I explained this is from a bile duct primary cancer.  -There is incidental finding of multiple tiny lung nodule which are indeterminate. Will watch his lung nodules for any  suspicion of metastasis.  My suspicion for metastatic disease is low but can not rule out.  -I explained surgery is the only way to cure his cancer and this is an aggressive type cancer which has a high risk of recurrence even after surgery.  -I discussed his case in GI Tumor board this morning and surgeon Dr. Barry Dienes feels given it's central location and portal vein invasion his cancer is not resectable. If truly non-resectable his cancer is no longer curable but still treatable with local or systemic therapy.  -I discussed his option of second surgical option at [facility] or [place]. I also discussed the role of neoadjuvant chemo to shrink the tumor, and reevaluate resectability after chemo. -If his next scan shows enlarging pulmonary nodules, which indicating metastatic disease, then surgery would not be an option. -I discussed the option of stereotactic radiation, radio embolization for his cancer control in liver. Dr. Lisbeth Renshaw feels SBRT radiation is feasible. -May obtain [facility] molecular testing on his tumor to see if he is eligible for further target or immunotherapy later down the road.   -I recommend him to start with systemic chemotherapy, if upfront surgery is not an option, and watch his lung nodules.  He is 25, has good baseline health, I would consider first-line systemic chemo with cisplatin and gemcitabine.  I explained the benefit and potential side effects to patient and his wife in detail. -If he chooses to not proceed with any treatment, I discussed his cancer would spread and lead to liver failure and eventually take his life with likely poor prognosis.  -After a lengthy discussion he opted to try chemotherapy first and see Dr. Barry Dienes and other surgeon's about surgery.  -When he decides to proceed with chemo I will set up chemo education class before start date. He opted to try peripheral access before PAC placement. I discussed we will monitor his chemo response with scans.  -Physical  exam today unremarkable today  -  he is scheduled to see Dr. Barry Dienes this Friday, I will touch base with him early next week, to see if he wants second opinion, or decides to proceed with chemotherapy.   2. GERD, Nausea, Low food intake and weight loss   -With pain from GERD and nausea he initially lost 30 pounds.  -Nausea now resolved, intermittent GERD and much improved appetite.  -He will continue Phenergan, Sucralfate and Pepcid  -I will send Dietician referral to help manage his eating. He is agreeable.    3. DM, HTN, HLD, Gout  -On Metformin, amlodipine, lisinopril., allopurinol  -Continue to f/u with his PCP  -Will monitor with treatment.   4. Nicotine Use -He never smoked but has been chewing Tobacco for the past 60 years.  -He now chews 1-2 times a day. I encouraged him to stop.  5. GRED and gastritis, history of esophageal candidiasis -He had reoeated EGD with Dr. Havery Moros in 07/2019. 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  -Will monitor and may repeat EGD in future    PLAN:  -he will see Dr. Barry Dienes this Friday, I will call him early next week, to see if he wants a second surgical opinion, or decides to proceed with chemotherapy. -Send dietician referral    No orders of the defined types were placed in this encounter.   All questions were answered. The patient knows to call the clinic with any problems, questions or concerns. I spent 55 minutes counseling the patient face to face. The total time spent in the appointment was 60 minutes and more than 50% was on counseling.     [name], MD 08/20/2019 11:40 PM  I, Joslyn Devon, am acting as scribe for [name], MD.   I have reviewed the above documentation for accuracy and completeness, and I agree with the above.

## 2019-08-20 ENCOUNTER — Encounter: Payer: Self-pay | Admitting: Hematology

## 2019-08-20 ENCOUNTER — Inpatient Hospital Stay: Payer: Medicare HMO | Attending: Hematology | Admitting: Hematology

## 2019-08-20 ENCOUNTER — Other Ambulatory Visit: Payer: Self-pay

## 2019-08-20 DIAGNOSIS — K2970 Gastritis, unspecified, without bleeding: Secondary | ICD-10-CM | POA: Insufficient documentation

## 2019-08-20 DIAGNOSIS — I10 Essential (primary) hypertension: Secondary | ICD-10-CM | POA: Insufficient documentation

## 2019-08-20 DIAGNOSIS — K219 Gastro-esophageal reflux disease without esophagitis: Secondary | ICD-10-CM | POA: Diagnosis not present

## 2019-08-20 DIAGNOSIS — R911 Solitary pulmonary nodule: Secondary | ICD-10-CM | POA: Insufficient documentation

## 2019-08-20 DIAGNOSIS — Z8719 Personal history of other diseases of the digestive system: Secondary | ICD-10-CM

## 2019-08-20 DIAGNOSIS — E119 Type 2 diabetes mellitus without complications: Secondary | ICD-10-CM | POA: Diagnosis not present

## 2019-08-20 DIAGNOSIS — Z7984 Long term (current) use of oral hypoglycemic drugs: Secondary | ICD-10-CM | POA: Insufficient documentation

## 2019-08-20 DIAGNOSIS — C221 Intrahepatic bile duct carcinoma: Secondary | ICD-10-CM | POA: Insufficient documentation

## 2019-08-20 DIAGNOSIS — M109 Gout, unspecified: Secondary | ICD-10-CM | POA: Insufficient documentation

## 2019-08-20 DIAGNOSIS — Z720 Tobacco use: Secondary | ICD-10-CM

## 2019-08-20 DIAGNOSIS — R634 Abnormal weight loss: Secondary | ICD-10-CM | POA: Insufficient documentation

## 2019-08-21 ENCOUNTER — Telehealth: Payer: Self-pay | Admitting: Hematology

## 2019-08-21 NOTE — Telephone Encounter (Signed)
Scheduled appt per 9/30 los.  Left a voice message of appt date and time.

## 2019-08-22 DIAGNOSIS — C221 Intrahepatic bile duct carcinoma: Secondary | ICD-10-CM | POA: Diagnosis not present

## 2019-08-22 NOTE — Telephone Encounter (Signed)
Pt stated that he is returning someone's call.

## 2019-08-25 NOTE — Telephone Encounter (Signed)
Called patient back and let him know I have checked his chart and I don't see any call our office had made to him. And maybe it was another Dr's office.

## 2019-08-26 ENCOUNTER — Encounter: Payer: Self-pay | Admitting: *Deleted

## 2019-08-27 NOTE — Progress Notes (Signed)
Urgent referral request faxed to [facility] addressed to Dr. Jyl Heinz or Dr. Bailey Mech. Included office number for return call if more information is needed on patient and how either surgeon get in contact with Dr. Burr Medico directly to discuss patient's case.   Received confirmation that fax went through successfully.

## 2019-08-28 ENCOUNTER — Telehealth: Payer: Self-pay | Admitting: Nutrition

## 2019-08-28 NOTE — Telephone Encounter (Signed)
Patient contacted me to reschedule his appointment from Monday, October 12 to Wednesday, October 14.  Patient has a doctor's appointment that he must attend on Monday.  Patient and wife would like for me to call them for his nutrition appointment.

## 2019-09-01 ENCOUNTER — Telehealth: Payer: Self-pay

## 2019-09-01 ENCOUNTER — Inpatient Hospital Stay: Payer: Medicare HMO | Admitting: Nutrition

## 2019-09-01 DIAGNOSIS — Z7984 Long term (current) use of oral hypoglycemic drugs: Secondary | ICD-10-CM | POA: Diagnosis not present

## 2019-09-01 DIAGNOSIS — C221 Intrahepatic bile duct carcinoma: Secondary | ICD-10-CM | POA: Diagnosis not present

## 2019-09-01 DIAGNOSIS — Z9089 Acquired absence of other organs: Secondary | ICD-10-CM | POA: Diagnosis not present

## 2019-09-01 DIAGNOSIS — F17220 Nicotine dependence, chewing tobacco, uncomplicated: Secondary | ICD-10-CM | POA: Diagnosis not present

## 2019-09-01 DIAGNOSIS — I10 Essential (primary) hypertension: Secondary | ICD-10-CM | POA: Diagnosis not present

## 2019-09-01 DIAGNOSIS — E119 Type 2 diabetes mellitus without complications: Secondary | ICD-10-CM | POA: Diagnosis not present

## 2019-09-01 NOTE — Telephone Encounter (Signed)
Spoke with Brandon Sherman. He met with MD at [facility] today and will be treated there. No needs or questions.

## 2019-09-03 ENCOUNTER — Ambulatory Visit: Payer: Medicare HMO | Admitting: Nutrition

## 2019-09-03 NOTE — Progress Notes (Signed)
Brief nutrition consult with patient who is diagnosed with a new liver cancer.  Patient will be receiving treatment at [facility]. I spoke to his wife on the phone to provide some general nutrition information.  I encouraged her to contact a dietitian with [facility] to assist patient with nutrition during treatment.  She has my contact information and she knows she can contact me with further questions or concerns.

## 2019-09-09 DIAGNOSIS — C227 Other specified carcinomas of liver: Secondary | ICD-10-CM | POA: Diagnosis not present

## 2019-09-09 NOTE — Progress Notes (Signed)
Katelin, please reach out to [facility] Dr. Ainsley Spinner office and request him to call me back today or tomorrow, to discuss patient's treatment plan. Thanks.   [name] MD

## 2019-09-10 DIAGNOSIS — I129 Hypertensive chronic kidney disease with stage 1 through stage 4 chronic kidney disease, or unspecified chronic kidney disease: Secondary | ICD-10-CM | POA: Diagnosis not present

## 2019-09-10 DIAGNOSIS — E785 Hyperlipidemia, unspecified: Secondary | ICD-10-CM | POA: Diagnosis not present

## 2019-09-10 DIAGNOSIS — E559 Vitamin D deficiency, unspecified: Secondary | ICD-10-CM | POA: Diagnosis not present

## 2019-09-10 DIAGNOSIS — N182 Chronic kidney disease, stage 2 (mild): Secondary | ICD-10-CM | POA: Diagnosis not present

## 2019-09-10 DIAGNOSIS — M109 Gout, unspecified: Secondary | ICD-10-CM | POA: Diagnosis not present

## 2019-09-10 DIAGNOSIS — Z23 Encounter for immunization: Secondary | ICD-10-CM | POA: Diagnosis not present

## 2019-09-10 DIAGNOSIS — I10 Essential (primary) hypertension: Secondary | ICD-10-CM | POA: Diagnosis not present

## 2019-09-10 DIAGNOSIS — Z139 Encounter for screening, unspecified: Secondary | ICD-10-CM | POA: Diagnosis not present

## 2019-09-10 DIAGNOSIS — E1122 Type 2 diabetes mellitus with diabetic chronic kidney disease: Secondary | ICD-10-CM | POA: Diagnosis not present

## 2019-09-10 DIAGNOSIS — C221 Intrahepatic bile duct carcinoma: Secondary | ICD-10-CM | POA: Diagnosis not present

## 2019-09-10 NOTE — Progress Notes (Signed)
I have not heard from Dr. Carlis Abbott (he is out of office). I called pt and left him a message, and encourage him to call if chemo is recommended or anything I can help him with.   [name] MD

## 2019-09-11 ENCOUNTER — Other Ambulatory Visit: Payer: Self-pay | Admitting: Gastroenterology

## 2019-09-15 ENCOUNTER — Other Ambulatory Visit: Payer: Self-pay | Admitting: Gastroenterology

## 2019-09-15 DIAGNOSIS — C787 Secondary malignant neoplasm of liver and intrahepatic bile duct: Secondary | ICD-10-CM | POA: Diagnosis not present

## 2019-09-15 DIAGNOSIS — K769 Liver disease, unspecified: Secondary | ICD-10-CM | POA: Diagnosis not present

## 2019-09-15 DIAGNOSIS — C221 Intrahepatic bile duct carcinoma: Secondary | ICD-10-CM | POA: Diagnosis not present

## 2019-09-17 ENCOUNTER — Telehealth: Payer: Self-pay

## 2019-09-17 NOTE — Telephone Encounter (Signed)
Left voice message for patient per Dr.Feng received notification from Dr. Carlis Abbott that the patient is not a surgical candidate.  Dr. Burr Medico would like the patient and his wife to come in this afternoon at 3:00 to discuss the treatment plan.  I asked that they call back and ask to speak to Dr. Ernestina Penna nurse and leave a message if they are able to come in.

## 2019-09-17 NOTE — Telephone Encounter (Signed)
Called and left VM on home phone and Mrs. Pendley's cell # advising of available appointment at 3 PM w/ Dr.Feng today to discuss steps moving forward.  Waiting on call back.

## 2019-09-17 NOTE — Telephone Encounter (Signed)
Called and left VM requesting call back to clarify where he will receive oncology tx.

## 2019-09-18 NOTE — Progress Notes (Signed)
[place]   [phone] [phone]   Clinic Follow up Note   Patient Care Team: [name], PA as PCP - General (Family Medicine) [name], MD as Consulting Physician (General Surgery) [name], [name], MD as Consulting Physician (Gastroenterology) [name], MD as Consulting Physician (Hematology)  Date of Service:  09/22/2019  CHIEF COMPLAINT: F/u of liver cancer   SUMMARY OF ONCOLOGIC HISTORY: Oncology History Overview Note  Cancer Staging Intrahepatic cholangiocarcinoma ([place]) Staging form: Intrahepatic Bile Duct, AJCC 8th Edition - Clinical stage from 08/19/2019: Stage II (cT2, cN0, cM0) - Signed by [name], MD on 08/19/2019    Intrahepatic cholangiocarcinoma ([place])  06/28/2019 Imaging   CT AP W Contrast 06/28/19  IMPRESSION: 1. Heterogeneous hypodensity posteriorly in the right hepatic lobe and potentially extending into the caudate lobe suspicious for a mass. There is felt to be truncation of branches of the portal vein in this vicinity and some narrowing of the hepatic vein, as well as triangular-shaped regions of abnormal hypoenhancement posteriorly in the right hepatic lobe likely representing downstream vascular effects. Cannot exclude malignancy such as cholangiocarcinoma or hepatocellular carcinoma, and follow up hepatic protocol MRI with and without contrast is recommended to further characterize. 2. 4 mm right middle lobe pulmonary nodule is likely benign but may merit surveillance. 3. Cholelithiasis. 4.  Aortic Atherosclerosis (ICD10-I70.0). 5. Prostatomegaly. 6. Mild impingement at L3-4 and L4-5.   07/31/2019 Imaging   MRI Liver 07/31/19 IMPRESSION: 1. 7.3 cm in long axis mass in the right hepatic lobe spanning into the caudate lobe, high suspicion for malignancy such as hepatocellular carcinoma or cholangiocarcinoma. Suspected effacement or occlusion of the right hepatic vein and posterior branches of the  right portal vein. Two smaller tumor nodules along the posterior periphery of the dominant mass. Tissue diagnosis is recommended. 2. No findings of pathologic adenopathy or distant metastatic spread. 3. 9 mm gallstone in the gallbladder. There is mild gallbladder wall thickening which may be from nondistention, correlate clinically in assessing for cholecystitis. 4.  Aortic Atherosclerosis (ICD10-I70.0). 5. Mild diffuse hepatic steatosis.   08/11/2019 Initial Biopsy   DIAGNOSIS: 08/11/19  A. LIVER, RIGHT, BIOPSY:  - Adenocarcinoma.   08/18/2019 Imaging   CT Chest 08/18/19  IMPRESSION: 1. Multiple pulmonary nodules largest at approximately 7 mm in the right lower lobe, nonspecific but concerning given findings in the liver. 2. No signs of definitive metastatic disease, also with mildly enlarged upper abdominal lymph nodes as discussed.   Aortic Atherosclerosis (ICD10-I70.0).   08/19/2019 Initial Diagnosis   Intrahepatic cholangiocarcinoma ([place])   08/19/2019 Cancer Staging   Staging form: Intrahepatic Bile Duct, AJCC 8th Edition - Clinical stage from 08/19/2019: Stage II (cT2, cN0, cM0) - Signed by [name], MD on 08/19/2019   09/29/2019 -  Chemotherapy   Cisplatin and Gemcitabine 2 weeks on/1 week off starting 09/29/19       CURRENT THERAPY:  Cisplatin and Gemcitabine 2 weeks on/1 week off starting 09/29/19  INTERVAL HISTORY:  Brandon Sherman is here for a follow up. He presents to the clinic with his wife. He notes he is feels well. He notes he hurt his back based on how much activity he does in the yard. He notes he is eating well with weigh controlled. He notes he has nausea based on what he eats and uses antiemetics as needed.  He has been seen by Dr. Carlis Abbott. He notes he is ready to start chemo. He notes he chews [place] daily still.  REVIEW OF SYSTEMS:   Constitutional: Denies fevers, chills or abnormal weight loss  Eyes: Denies blurriness of vision Ears, nose,  mouth, throat, and face: Denies mucositis or sore throat Respiratory: Denies cough, dyspnea or wheezes Cardiovascular: Denies palpitation, chest discomfort or lower extremity swelling Gastrointestinal:  Denies nausea, heartburn or change in bowel habits Skin: Denies abnormal skin rashes Lymphatics: Denies new lymphadenopathy or easy bruising Neurological:Denies numbness, tingling or new weaknesses Behavioral/Psych: Mood is stable, no new changes  All other systems were reviewed with the patient and are negative.  MEDICAL HISTORY:  Past Medical History:  Diagnosis Date  . Arthritis   . Diabetes ([place])   . GERD (gastroesophageal reflux disease)   . Hyperlipidemia   . Hypertension   . Intrahepatic cholangiocarcinoma ([place])     SURGICAL HISTORY: Past Surgical History:  Procedure Laterality Date  . APPENDECTOMY  1980  . COLONOSCOPY      I have reviewed the social history and family history with the patient and they are unchanged from previous note.  ALLERGIES:  has No Known Allergies.  MEDICATIONS:  Current Outpatient Medications  Medication Sig Dispense Refill  . allopurinol (ZYLOPRIM) 100 MG tablet Take 100 mg by mouth daily.    Marland Kitchen amLODipine (NORVASC) 2.5 MG tablet Take 2.5 mg by mouth daily.    Marland Kitchen aspirin EC 81 MG tablet Take 81 mg by mouth daily.    . famotidine (PEPCID) 40 MG tablet Take 0.5 tablets (20 mg total) by mouth 2 (two) times daily. 30 tablet 5  . fenofibrate (TRICOR) 145 MG tablet Take 145 mg by mouth daily.    . finasteride (PROSCAR) 5 MG tablet Take 5 mg by mouth daily.    Marland Kitchen lisinopril (ZESTRIL) 40 MG tablet Take 40 mg by mouth daily.    . metFORMIN (GLUCOPHAGE) 500 MG tablet Take 500 mg by mouth 2 (two) times daily with a meal.     . ondansetron (ZOFRAN) 4 MG tablet Take 4 mg by mouth every 8 (eight) hours as needed for nausea or vomiting.    . pantoprazole (PROTONIX) 40 MG tablet TAKE 1 TABLET BY MOUTH TWICE A DAY 60 tablet 2  . pravastatin (PRAVACHOL) 40 MG  tablet Take 40 mg by mouth daily.    . promethazine (PHENERGAN) 12.5 MG tablet TAKE 1 TABLET BY MOUTH EVERY 6 HOURS AS NEEDED FOR NAUSEA OR VOMITING. 20 tablet 1  . sucralfate (CARAFATE) 1 g tablet Take 1 tablet (1 g total) by mouth 4 (four) times daily -  with meals and at bedtime. 90 tablet 1  . tamsulosin (FLOMAX) 0.4 MG CAPS capsule Take 0.4 mg by mouth daily.    . Vitamin D, Ergocalciferol, (DRISDOL) 1.25 MG (50000 UT) CAPS capsule Take 50,000 Units by mouth every 7 (seven) days.     No current facility-administered medications for this visit.     PHYSICAL EXAMINATION: ECOG PERFORMANCE STATUS: 0 - Asymptomatic  Vitals:   09/22/19 1133  BP: 140/87  Pulse: 100  Resp: 18  Temp: 98.2 F (36.8 C)  SpO2: 100%   Filed Weights   09/22/19 1133  Weight: 210 lb 14.4 oz (95.7 kg)    GENERAL:alert, no distress and comfortable SKIN: skin color, texture, turgor are normal, no rashes or significant lesions EYES: normal, Conjunctiva are pink and non-injected, sclera clear  NECK: supple, thyroid normal size, non-tender, without nodularity LYMPH:  no palpable lymphadenopathy in the cervical, axillary  LUNGS: clear to auscultation and percussion with normal breathing effort HEART: regular  rate & rhythm and no murmurs and no lower extremity edema ABDOMEN:abdomen soft, non-tender and normal bowel sounds Musculoskeletal:no cyanosis of digits and no clubbing  NEURO: alert & oriented x 3 with fluent speech, no focal motor/sensory deficits  LABORATORY DATA:  I have reviewed the data as listed CBC Latest Ref Rng & Units 08/11/2019 06/26/2019  WBC 4.0 - 10.5 K/uL 9.8 10.3  Hemoglobin 13.0 - 17.0 g/dL 16.1 14.4  Hematocrit 39.0 - 52.0 % 47.7 43.7  Platelets 150 - 400 K/uL 322 286.0     CMP Latest Ref Rng & Units 08/15/2019 08/06/2019 06/26/2019  Glucose 70 - 99 mg/dL 105(H) - 106(H)  BUN 6 - 23 mg/dL 13 - 14  Creatinine 0.40 - 1.50 mg/dL 1.02 - 0.88  Sodium 135 - 145 mEq/L 138 - 140  Potassium  3.5 - 5.1 mEq/L 3.6 - 3.6  Chloride 96 - 112 mEq/L 104 - 109  CO2 19 - 32 mEq/L 26 - 23  Calcium 8.4 - 10.5 mg/dL 9.1 - 8.6  Total Protein 6.0 - 8.3 g/dL - 5.3(L) 4.7(L)  Total Bilirubin 0.2 - 1.2 mg/dL - 0.4 0.3  Alkaline Phos 39 - 117 U/L - 37(L) 28(L)  AST 0 - 37 U/L - 16 16  ALT 0 - 53 U/L - 14 16      RADIOGRAPHIC STUDIES: I have personally reviewed the radiological images as listed and agreed with the findings in the report. No results found.   ASSESSMENT & PLAN:  Brandon Sherman is a 73 y.o. male with   1. Intrahepatic cholangiocarcinoma, cT2N0Mx, unresectable, with indeterminate lung nodules  -He was recently diagnosed in 07/2019. CT scans and MRI liver show a large 7.3cm mass in the right hepatic lobe which abuts portal vein.  -He was seen by our local surgeon Dr. Barry Dienes and Dr Carlis Abbott at [facility], both concluded that cancer is not resectable due to the invasion to portal vein. -I discussed given his cancer is non-resectable his cancer is likely not curable but still treatable. I discussed standard systemic treatment with chemo to control his disease and prolong his life. Per NCCN guideline, I recommend the standard first line chemo with IV Cisplatin and Gemcitabine 2 weeks on/1 week off. May reduce to every 2 weeks if not well tolerated.   --Chemotherapy consent: Side effects including but does not limited to, fatigue, nausea, vomiting, diarrhea, hair loss, neuropathy, fluid retention, renal and kidney dysfunction, neutropenic fever, needed for blood transfusion, bleeding, were discussed with patient in great detail. He agrees to proceed. -The goal of therapy is palliative to prolong his life -I also discussed doing FO genomic panel on tumor sample to determine if he is eligible for target or immunotherapy. Clinical trail would be considered after standard treatments have been tried.  -I discussed he will be on treatment for as long at it controls disease or he can tolerate. If stable  after 4-6 months therapy, he is eligible for maintenance therapy also. He understands.   -He is currently asymptomatic, Physical exam today unremarkable. Plan to start chemo in 1 week -Proceed with chemo education class before start of chemo.  -Given long term IV treatment, I recommend PAC placement. He will think about and start with peripheral access for now.  -I discussed obtaining a new baseline CT AP scan this week. Will repeat every 3 months to monitor response to treatment. He is agreeable.  -f/u in 2 weeks. I encouraged him to contact us if he has isuses with  chemo   2. Nausea, Low food intake and weight loss   -With pain from GERD and nausea he initially lost 30 pounds.  -His nausea and GERD is based on what he eats. Controlled on Phenergan, Sucralfate and Pepcid.  -he was previously seen by Dietician. His weight is stable overall, he is eating adequately.   3. DM, HTN, HLD, Gout  -On Metformin, amlodipine, lisinopril., allopurinol  -Continue to f/u with his PCP  -Will monitor with treatment.  We discussed that chemotherapy may impact his sugar level and blood pressure, I may adjust his medication if needed.  4. Nicotine Use -He never smoked but has been chewing Tobacco for the past 60 years. He no longer drinks alcohol.  -He now chews 1-2 times a day. I again encouraged him to reduce and quit completely.   5. GERD and gastritis, history of esophageal candidiasis -He had repeated EGD with Dr. Havery Moros in 07/2019. 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  -Will monitor and may repeat EGD in future    PLAN:  -Lab and chemo Cisplatin and Gemcitabine in 1, 2, 4, 5 weeks  -Chemo education class this week  -CT AP W contrast in next week  -F/u in 2 weeks   No problem-specific Assessment & Plan notes found for this encounter.   No orders of the defined types were placed in this  encounter.  All questions were answered. The patient knows to call the clinic with any problems, questions or concerns. No barriers to learning was detected. I spent 30 minutes counseling the patient face to face. The total time spent in the appointment was 40 minutes and more than 50% was on counseling and review of test results     [name], MD 09/22/2019   I, Joslyn Devon, am acting as scribe for [name], MD.   I have reviewed the above documentation for accuracy and completeness, and I agree with the above.

## 2019-09-22 ENCOUNTER — Encounter: Payer: Self-pay | Admitting: Hematology

## 2019-09-22 ENCOUNTER — Inpatient Hospital Stay: Payer: Medicare HMO | Attending: Hematology | Admitting: Hematology

## 2019-09-22 ENCOUNTER — Other Ambulatory Visit: Payer: Self-pay

## 2019-09-22 VITALS — BP 140/87 | HR 100 | Temp 98.2°F | Resp 18 | Ht 69.0 in | Wt 210.9 lb

## 2019-09-22 DIAGNOSIS — Z5111 Encounter for antineoplastic chemotherapy: Secondary | ICD-10-CM | POA: Diagnosis not present

## 2019-09-22 DIAGNOSIS — E119 Type 2 diabetes mellitus without complications: Secondary | ICD-10-CM | POA: Diagnosis not present

## 2019-09-22 DIAGNOSIS — R911 Solitary pulmonary nodule: Secondary | ICD-10-CM | POA: Diagnosis not present

## 2019-09-22 DIAGNOSIS — I10 Essential (primary) hypertension: Secondary | ICD-10-CM | POA: Diagnosis not present

## 2019-09-22 DIAGNOSIS — Z7984 Long term (current) use of oral hypoglycemic drugs: Secondary | ICD-10-CM | POA: Diagnosis not present

## 2019-09-22 DIAGNOSIS — C221 Intrahepatic bile duct carcinoma: Secondary | ICD-10-CM | POA: Diagnosis not present

## 2019-09-22 DIAGNOSIS — K219 Gastro-esophageal reflux disease without esophagitis: Secondary | ICD-10-CM | POA: Diagnosis not present

## 2019-09-22 DIAGNOSIS — R110 Nausea: Secondary | ICD-10-CM | POA: Diagnosis not present

## 2019-09-22 DIAGNOSIS — Z5189 Encounter for other specified aftercare: Secondary | ICD-10-CM | POA: Insufficient documentation

## 2019-09-22 DIAGNOSIS — Z720 Tobacco use: Secondary | ICD-10-CM | POA: Insufficient documentation

## 2019-09-22 DIAGNOSIS — Z7189 Other specified counseling: Secondary | ICD-10-CM | POA: Diagnosis not present

## 2019-09-22 MED ORDER — ONDANSETRON HCL 8 MG PO TABS: 8.0000 mg | ORAL_TABLET | Freq: Two times a day (BID) | ORAL | 1 refills | Status: DC | PRN

## 2019-09-22 MED ORDER — PROCHLORPERAZINE MALEATE 10 MG PO TABS: 10.0000 mg | ORAL_TABLET | Freq: Four times a day (QID) | ORAL | 1 refills | Status: DC | PRN

## 2019-09-22 NOTE — Progress Notes (Signed)
START OFF PATHWAY REGIMEN - Other   [id number]:Cisplatin 25 mg/m2 D1,8 + Gemcitabine 1,000 mg/m2 D1,8 q21 Days:   A cycle is every 21 days:     Gemcitabine      Cisplatin   **Always confirm dose/schedule in your pharmacy ordering system**  Patient Characteristics: Intent of Therapy: Non-Curative / Palliative Intent, Discussed with Patient 

## 2019-09-23 ENCOUNTER — Telehealth: Payer: Self-pay | Admitting: Hematology

## 2019-09-23 NOTE — Telephone Encounter (Signed)
Scheduled appt per 11/2 los.  I had called the pt yesterday 11/2 and he is already aware of his scheduled chemo ed class.  Called patient today to inform him of his treatment being schedueld for 11/9.  Left a VM of the appt date and time.  Also I sent his second treatment as an add-on to get it added for 11/16.  The Md might change from lacie to feng per our secure chat.

## 2019-09-26 ENCOUNTER — Inpatient Hospital Stay: Payer: Medicare HMO

## 2019-09-26 ENCOUNTER — Other Ambulatory Visit: Payer: Self-pay

## 2019-09-26 DIAGNOSIS — C221 Intrahepatic bile duct carcinoma: Secondary | ICD-10-CM

## 2019-09-29 ENCOUNTER — Inpatient Hospital Stay: Payer: Medicare HMO

## 2019-09-29 ENCOUNTER — Other Ambulatory Visit: Payer: Self-pay

## 2019-09-29 VITALS — BP 118/67 | HR 66 | Temp 98.2°F | Resp 16

## 2019-09-29 DIAGNOSIS — Z7189 Other specified counseling: Secondary | ICD-10-CM

## 2019-09-29 DIAGNOSIS — I10 Essential (primary) hypertension: Secondary | ICD-10-CM | POA: Diagnosis not present

## 2019-09-29 DIAGNOSIS — C221 Intrahepatic bile duct carcinoma: Secondary | ICD-10-CM

## 2019-09-29 DIAGNOSIS — Z5189 Encounter for other specified aftercare: Secondary | ICD-10-CM | POA: Diagnosis not present

## 2019-09-29 DIAGNOSIS — R911 Solitary pulmonary nodule: Secondary | ICD-10-CM | POA: Diagnosis not present

## 2019-09-29 DIAGNOSIS — E119 Type 2 diabetes mellitus without complications: Secondary | ICD-10-CM | POA: Diagnosis not present

## 2019-09-29 DIAGNOSIS — K219 Gastro-esophageal reflux disease without esophagitis: Secondary | ICD-10-CM | POA: Diagnosis not present

## 2019-09-29 DIAGNOSIS — Z5111 Encounter for antineoplastic chemotherapy: Secondary | ICD-10-CM | POA: Diagnosis not present

## 2019-09-29 DIAGNOSIS — Z720 Tobacco use: Secondary | ICD-10-CM | POA: Diagnosis not present

## 2019-09-29 DIAGNOSIS — R110 Nausea: Secondary | ICD-10-CM | POA: Diagnosis not present

## 2019-09-29 LAB — CBC WITH DIFFERENTIAL (CANCER CENTER ONLY)
Abs Immature Granulocytes: 0.01 10*3/uL (ref 0.00–0.07)
Basophils Absolute: 0.2 10*3/uL — ABNORMAL HIGH (ref 0.0–0.1)
Basophils Relative: 2 %
Eosinophils Absolute: 0.7 10*3/uL — ABNORMAL HIGH (ref 0.0–0.5)
Eosinophils Relative: 8 %
HCT: 41.4 % (ref 39.0–52.0)
Hemoglobin: 13.8 g/dL (ref 13.0–17.0)
Immature Granulocytes: 0 %
Lymphocytes Relative: 21 %
Lymphs Abs: 1.7 10*3/uL (ref 0.7–4.0)
MCH: 29.9 pg (ref 26.0–34.0)
MCHC: 33.3 g/dL (ref 30.0–36.0)
MCV: 89.6 fL (ref 80.0–100.0)
Monocytes Absolute: 0.5 10*3/uL (ref 0.1–1.0)
Monocytes Relative: 6 %
Neutro Abs: 5.3 10*3/uL (ref 1.7–7.7)
Neutrophils Relative %: 63 %
Platelet Count: 273 10*3/uL (ref 150–400)
RBC: 4.62 MIL/uL (ref 4.22–5.81)
RDW: 12.9 % (ref 11.5–15.5)
WBC Count: 8.4 10*3/uL (ref 4.0–10.5)
nRBC: 0 % (ref 0.0–0.2)

## 2019-09-29 LAB — CMP (CANCER CENTER ONLY)
ALT: 11 U/L (ref 0–44)
AST: 10 U/L — ABNORMAL LOW (ref 15–41)
Albumin: 3 g/dL — ABNORMAL LOW (ref 3.5–5.0)
Alkaline Phosphatase: 39 U/L (ref 38–126)
Anion gap: 8 (ref 5–15)
BUN: 12 mg/dL (ref 8–23)
CO2: 23 mmol/L (ref 22–32)
Calcium: 8.6 mg/dL — ABNORMAL LOW (ref 8.9–10.3)
Chloride: 108 mmol/L (ref 98–111)
Creatinine: 1.04 mg/dL (ref 0.61–1.24)
GFR, Est AFR Am: >60 mL/min (ref >60)
GFR, Estimated: >60 mL/min (ref >60)
Glucose, Bld: 145 mg/dL — ABNORMAL HIGH (ref 70–99)
Potassium: 3.9 mmol/L (ref 3.5–5.1)
Sodium: 139 mmol/L (ref 135–145)
Total Bilirubin: 0.4 mg/dL (ref 0.3–1.2)
Total Protein: 5.2 g/dL — ABNORMAL LOW (ref 6.5–8.1)

## 2019-09-29 MED ORDER — PALONOSETRON HCL INJECTION 0.25 MG/5ML
0.2500 mg | Freq: Once | INTRAVENOUS | Status: AC
  Administered 2019-09-29: 0.25 mg via INTRAVENOUS

## 2019-09-29 MED ORDER — SODIUM CHLORIDE 0.9 % IV SOLN
Freq: Once | INTRAVENOUS | Status: AC
  Administered 2019-09-29: 10:00:00 via INTRAVENOUS
  Filled 2019-09-29: qty 250

## 2019-09-29 MED ORDER — SODIUM CHLORIDE 0.9 % IV SOLN
Freq: Once | INTRAVENOUS | Status: AC
  Administered 2019-09-29: 13:00:00 via INTRAVENOUS
  Filled 2019-09-29: qty 5

## 2019-09-29 MED ORDER — SODIUM CHLORIDE 0.9 % IV SOLN
1000.0000 mg/m2 | Freq: Once | INTRAVENOUS | Status: AC
  Administered 2019-09-29: 2166 mg via INTRAVENOUS
  Filled 2019-09-29: qty 56.97

## 2019-09-29 MED ORDER — PALONOSETRON HCL INJECTION 0.25 MG/5ML
INTRAVENOUS | Status: AC
  Filled 2019-09-29: qty 5

## 2019-09-29 MED ORDER — SODIUM CHLORIDE 0.9 % IV SOLN
25.0000 mg/m2 | Freq: Once | INTRAVENOUS | Status: AC
  Administered 2019-09-29: 54 mg via INTRAVENOUS
  Filled 2019-09-29: qty 54

## 2019-09-29 MED ORDER — POTASSIUM CHLORIDE 2 MEQ/ML IV SOLN
Freq: Once | INTRAVENOUS | Status: AC
  Administered 2019-09-29: 11:00:00 via INTRAVENOUS
  Filled 2019-09-29: qty 10

## 2019-09-29 NOTE — Patient Instructions (Signed)
[facility] Discharge Instructions for Patients Receiving Chemotherapy  Today you received the following chemotherapy agents Gemzar and Cisplatin  To help prevent nausea and vomiting after your treatment, we encourage you to take your nausea medication as directed.   If you develop nausea and vomiting that is not controlled by your nausea medication, call the clinic.   BELOW ARE SYMPTOMS THAT SHOULD BE REPORTED IMMEDIATELY:  *FEVER GREATER THAN 100.5 F  *CHILLS WITH OR WITHOUT FEVER  NAUSEA AND VOMITING THAT IS NOT CONTROLLED WITH YOUR NAUSEA MEDICATION  *UNUSUAL SHORTNESS OF BREATH  *UNUSUAL BRUISING OR BLEEDING  TENDERNESS IN MOUTH AND THROAT WITH OR WITHOUT PRESENCE OF ULCERS  *URINARY PROBLEMS  *BOWEL PROBLEMS  UNUSUAL RASH Items with * indicate a potential emergency and should be followed up as soon as possible.  Feel free to call the clinic should you have any questions or concerns. The clinic phone number is [phone].  Please show the [facility] at check-in to the Emergency Department and triage nurse.  Gemcitabine (Gemzar) injection What is this medicine? GEMCITABINE (jem SYE ta been) is a chemotherapy drug. This medicine is used to treat many types of cancer like breast cancer, lung cancer, pancreatic cancer, and ovarian cancer. This medicine may be used for other purposes; ask your health care provider or pharmacist if you have questions. COMMON BRAND NAME(S): Gemzar, Infugem What should I tell my health care provider before I take this medicine? They need to know if you have any of these conditions:  blood disorders  infection  kidney disease  liver disease  lung or breathing disease, like asthma  recent or ongoing radiation therapy  an unusual or allergic reaction to gemcitabine, other chemotherapy, other medicines, foods, dyes, or preservatives  pregnant or trying to get pregnant  breast-feeding How should I use this  medicine? This drug is given as an infusion into a vein. It is administered in a hospital or clinic by a specially trained health care professional. Talk to your pediatrician regarding the use of this medicine in children. Special care may be needed. Overdosage: If you think you have taken too much of this medicine contact a poison control center or emergency room at once. NOTE: This medicine is only for you. Do not share this medicine with others. What if I miss a dose? It is important not to miss your dose. Call your doctor or health care professional if you are unable to keep an appointment. What may interact with this medicine?  medicines to increase blood counts like filgrastim, pegfilgrastim, sargramostim  some other chemotherapy drugs like cisplatin  vaccines Talk to your doctor or health care professional before taking any of these medicines:  acetaminophen  aspirin  ibuprofen  ketoprofen  naproxen This list may not describe all possible interactions. Give your health care provider a list of all the medicines, herbs, non-prescription drugs, or dietary supplements you use. Also tell them if you smoke, drink alcohol, or use illegal drugs. Some items may interact with your medicine. What should I watch for while using this medicine? Visit your doctor for checks on your progress. This drug may make you feel generally unwell. This is not uncommon, as chemotherapy can affect healthy cells as well as cancer cells. Report any side effects. Continue your course of treatment even though you feel ill unless your doctor tells you to stop. In some cases, you may be given additional medicines to help with side effects. Follow all directions for  their use. Call your doctor or health care professional for advice if you get a fever, chills or sore throat, or other symptoms of a cold or flu. Do not treat yourself. This drug decreases your body's ability to fight infections. Try to avoid being  around people who are sick. This medicine may increase your risk to bruise or bleed. Call your doctor or health care professional if you notice any unusual bleeding. Be careful brushing and flossing your teeth or using a toothpick because you may get an infection or bleed more easily. If you have any dental work done, tell your dentist you are receiving this medicine. Avoid taking products that contain aspirin, acetaminophen, ibuprofen, naproxen, or ketoprofen unless instructed by your doctor. These medicines may hide a fever. Do not become pregnant while taking this medicine or for 6 months after stopping it. Women should inform their doctor if they wish to become pregnant or think they might be pregnant. Men should not father a child while taking this medicine and for 3 months after stopping it. There is a potential for serious side effects to an unborn child. Talk to your health care professional or pharmacist for more information. Do not breast-feed an infant while taking this medicine or for at least 1 week after stopping it. Men should inform their doctors if they wish to father a child. This medicine may lower sperm counts. Talk with your doctor or health care professional if you are concerned about your fertility. What side effects may I notice from receiving this medicine? Side effects that you should report to your doctor or health care professional as soon as possible:  allergic reactions like skin rash, itching or hives, swelling of the face, lips, or tongue  breathing problems  pain, redness, or irritation at site where injected  signs and symptoms of a dangerous change in heartbeat or heart rhythm like chest pain; dizziness; fast or irregular heartbeat; palpitations; feeling faint or lightheaded, falls; breathing problems  signs of decreased platelets or bleeding - bruising, pinpoint red spots on the skin, black, tarry stools, blood in the urine  signs of decreased red blood cells -  unusually weak or tired, feeling faint or lightheaded, falls  signs of infection - fever or chills, cough, sore throat, pain or difficulty passing urine  signs and symptoms of kidney injury like trouble passing urine or change in the amount of urine  signs and symptoms of liver injury like dark yellow or brown urine; general ill feeling or flu-like symptoms; light-colored stools; loss of appetite; nausea; right upper belly pain; unusually weak or tired; yellowing of the eyes or skin  swelling of ankles, feet, hands Side effects that usually do not require medical attention (report to your doctor or health care professional if they continue or are bothersome):  constipation  diarrhea  hair loss  loss of appetite  nausea  rash  vomiting This list may not describe all possible side effects. Call your doctor for medical advice about side effects. You may report side effects to FDA at 1-800-FDA-1088. Where should I keep my medicine? This drug is given in a hospital or clinic and will not be stored at home. NOTE: This sheet is a summary. It may not cover all possible information. If you have questions about this medicine, talk to your doctor, pharmacist, or health care provider.  2020 [employer]/Gold Standard (2018-01-30 18:06:11)  Cisplatin injection What is this medicine? CISPLATIN (SIS pla tin) is a chemotherapy drug. It targets fast   dividing cells, like cancer cells, and causes these cells to die. This medicine is used to treat many types of cancer like bladder, ovarian, and testicular cancers. This medicine may be used for other purposes; ask your health care provider or pharmacist if you have questions. COMMON BRAND NAME(S): Platinol, Platinol -AQ What should I tell my health care provider before I take this medicine? They need to know if you have any of these conditions:  blood disorders  hearing problems  kidney disease  recent or ongoing radiation therapy  an unusual  or allergic reaction to cisplatin, carboplatin, other chemotherapy, other medicines, foods, dyes, or preservatives  pregnant or trying to get pregnant  breast-feeding How should I use this medicine? This drug is given as an infusion into a vein. It is administered in a hospital or clinic by a specially trained health care professional. Talk to your pediatrician regarding the use of this medicine in children. Special care may be needed. Overdosage: If you think you have taken too much of this medicine contact a poison control center or emergency room at once. NOTE: This medicine is only for you. Do not share this medicine with others. What if I miss a dose? It is important not to miss a dose. Call your doctor or health care professional if you are unable to keep an appointment. What may interact with this medicine?  dofetilide  foscarnet  medicines for seizures  medicines to increase blood counts like filgrastim, pegfilgrastim, sargramostim  probenecid  pyridoxine used with altretamine  rituximab  some antibiotics like amikacin, gentamicin, neomycin, polymyxin B, streptomycin, tobramycin  sulfinpyrazone  vaccines  zalcitabine Talk to your doctor or health care professional before taking any of these medicines:  acetaminophen  aspirin  ibuprofen  ketoprofen  naproxen This list may not describe all possible interactions. Give your health care provider a list of all the medicines, herbs, non-prescription drugs, or dietary supplements you use. Also tell them if you smoke, drink alcohol, or use illegal drugs. Some items may interact with your medicine. What should I watch for while using this medicine? Your condition will be monitored carefully while you are receiving this medicine. You will need important blood work done while you are taking this medicine. This drug may make you feel generally unwell. This is not uncommon, as chemotherapy can affect healthy cells as well  as cancer cells. Report any side effects. Continue your course of treatment even though you feel ill unless your doctor tells you to stop. In some cases, you may be given additional medicines to help with side effects. Follow all directions for their use. Call your doctor or health care professional for advice if you get a fever, chills or sore throat, or other symptoms of a cold or flu. Do not treat yourself. This drug decreases your body's ability to fight infections. Try to avoid being around people who are sick. This medicine may increase your risk to bruise or bleed. Call your doctor or health care professional if you notice any unusual bleeding. Be careful brushing and flossing your teeth or using a toothpick because you may get an infection or bleed more easily. If you have any dental work done, tell your dentist you are receiving this medicine. Avoid taking products that contain aspirin, acetaminophen, ibuprofen, naproxen, or ketoprofen unless instructed by your doctor. These medicines may hide a fever. Do not become pregnant while taking this medicine. Women should inform their doctor if they wish to become pregnant or think  they might be pregnant. There is a potential for serious side effects to an unborn child. Talk to your health care professional or pharmacist for more information. Do not breast-feed an infant while taking this medicine. Drink fluids as directed while you are taking this medicine. This will help protect your kidneys. Call your doctor or health care professional if you get diarrhea. Do not treat yourself. What side effects may I notice from receiving this medicine? Side effects that you should report to your doctor or health care professional as soon as possible:  allergic reactions like skin rash, itching or hives, swelling of the face, lips, or tongue  signs of infection - fever or chills, cough, sore throat, pain or difficulty passing urine  signs of decreased  platelets or bleeding - bruising, pinpoint red spots on the skin, black, tarry stools, nosebleeds  signs of decreased red blood cells - unusually weak or tired, fainting spells, lightheadedness  breathing problems  changes in hearing  gout pain  low blood counts - This drug may decrease the number of white blood cells, red blood cells and platelets. You may be at increased risk for infections and bleeding.  nausea and vomiting  pain, swelling, redness or irritation at the injection site  pain, tingling, numbness in the hands or feet  problems with balance, movement  trouble passing urine or change in the amount of urine Side effects that usually do not require medical attention (report to your doctor or health care professional if they continue or are bothersome):  changes in vision  loss of appetite  metallic taste in the mouth or changes in taste This list may not describe all possible side effects. Call your doctor for medical advice about side effects. You may report side effects to FDA at 1-800-FDA-1088. Where should I keep my medicine? This drug is given in a hospital or clinic and will not be stored at home. NOTE: This sheet is a summary. It may not cover all possible information. If you have questions about this medicine, talk to your doctor, pharmacist, or health care provider.  2020 [employer]vier/Gold Standard (2008-02-11 14:40:54)

## 2019-09-30 ENCOUNTER — Telehealth: Payer: Self-pay | Admitting: *Deleted

## 2019-09-30 NOTE — Telephone Encounter (Signed)
-----   Message from Ishmael Holter, RN sent at 09/29/2019  4:51 PM EST ----- Regarding: Dr. Burr Medico 1st tx f/u call Dr. Burr Medico 1st tx f/u call. Gemzar Cisplatin.Marland KitchenMarland KitchenTolerated treatment well

## 2019-09-30 NOTE — Telephone Encounter (Signed)
Called pt to check to see how he did with his treatment yest.  He reports no side effects but knows what to call for & denies any questions/concerns.  He knows how to reach [place] if needed.

## 2019-10-02 ENCOUNTER — Other Ambulatory Visit: Payer: Self-pay | Admitting: Hematology

## 2019-10-02 DIAGNOSIS — C221 Intrahepatic bile duct carcinoma: Secondary | ICD-10-CM | POA: Diagnosis not present

## 2019-10-02 DIAGNOSIS — D490 Neoplasm of unspecified behavior of digestive system: Secondary | ICD-10-CM

## 2019-10-03 ENCOUNTER — Ambulatory Visit: Payer: Medicare HMO

## 2019-10-03 ENCOUNTER — Other Ambulatory Visit: Payer: Medicare HMO

## 2019-10-05 NOTE — Progress Notes (Signed)
[facility]   [phone] [phone]   Clinic Follow up Note   Patient Care Team: [name], PA as PCP - General (Family Medicine) [name], MD as Consulting Physician (General Surgery) [name], [name], MD as Consulting Physician (Gastroenterology) [name], MD as Consulting Physician (Hematology) 10/06/2019  CHIEF COMPLAINT: F/u intrahepatic cholangiocarcinoma   SUMMARY OF ONCOLOGIC HISTORY: Oncology History Overview Note  Cancer Staging Intrahepatic cholangiocarcinoma ([place]) Staging form: Intrahepatic Bile Duct, AJCC 8th Edition - Clinical stage from 08/19/2019: Stage II (cT2, cN0, cM0) - Signed by [name], MD on 08/19/2019    Intrahepatic cholangiocarcinoma ([place])  06/28/2019 Imaging   CT AP W Contrast 06/28/19  IMPRESSION: 1. Heterogeneous hypodensity posteriorly in the right hepatic lobe and potentially extending into the caudate lobe suspicious for a mass. There is felt to be truncation of branches of the portal vein in this vicinity and some narrowing of the hepatic vein, as well as triangular-shaped regions of abnormal hypoenhancement posteriorly in the right hepatic lobe likely representing downstream vascular effects. Cannot exclude malignancy such as cholangiocarcinoma or hepatocellular carcinoma, and follow up hepatic protocol MRI with and without contrast is recommended to further characterize. 2. 4 mm right middle lobe pulmonary nodule is likely benign but may merit surveillance. 3. Cholelithiasis. 4.  Aortic Atherosclerosis (ICD10-I70.0). 5. Prostatomegaly. 6. Mild impingement at L3-4 and L4-5.   07/31/2019 Imaging   MRI Liver 07/31/19 IMPRESSION: 1. 7.3 cm in long axis mass in the right hepatic lobe spanning into the caudate lobe, high suspicion for malignancy such as hepatocellular carcinoma or cholangiocarcinoma. Suspected effacement or occlusion of the right hepatic vein and posterior branches of the right  portal vein. Two smaller tumor nodules along the posterior periphery of the dominant mass. Tissue diagnosis is recommended. 2. No findings of pathologic adenopathy or distant metastatic spread. 3. 9 mm gallstone in the gallbladder. There is mild gallbladder wall thickening which may be from nondistention, correlate clinically in assessing for cholecystitis. 4.  Aortic Atherosclerosis (ICD10-I70.0). 5. Mild diffuse hepatic steatosis.   08/11/2019 Initial Biopsy   DIAGNOSIS: 08/11/19  A. LIVER, RIGHT, BIOPSY:  - Adenocarcinoma.   08/18/2019 Imaging   CT Chest 08/18/19  IMPRESSION: 1. Multiple pulmonary nodules largest at approximately 7 mm in the right lower lobe, nonspecific but concerning given findings in the liver. 2. No signs of definitive metastatic disease, also with mildly enlarged upper abdominal lymph nodes as discussed.   Aortic Atherosclerosis (ICD10-I70.0).   08/19/2019 Initial Diagnosis   Intrahepatic cholangiocarcinoma ([place])   08/19/2019 Cancer Staging   Staging form: Intrahepatic Bile Duct, AJCC 8th Edition - Clinical stage from 08/19/2019: Stage II (cT2, cN0, cM0) - Signed by [name], MD on 08/19/2019   09/29/2019 -  Chemotherapy   Cisplatin and Gemcitabine 2 weeks on/1 week off starting 09/29/19    09/29/2019 -  Chemotherapy   The patient had palonosetron (ALOXI) injection 0.25 mg, 0.25 mg, Intravenous,  Once, 1 of 4 cycles Administration: 0.25 mg (09/29/2019) pegfilgrastim-jmdb (FULPHILA) injection 6 mg, 6 mg, Subcutaneous,  Once, 1 of 4 cycles CISplatin (PLATINOL) 54 mg in sodium chloride 0.9 % 250 mL chemo infusion, 25 mg/m2 = 54 mg, Intravenous,  Once, 1 of 4 cycles Administration: 54 mg (09/29/2019) gemcitabine (GEMZAR) 2,166 mg in sodium chloride 0.9 % 250 mL chemo infusion, 1,000 mg/m2 = 2,166 mg, Intravenous,  Once, 1 of 4 cycles Administration: 2,166 mg (09/29/2019) fosaprepitant (EMEND) 150 mg, dexamethasone (DECADRON) 12 mg in sodium chloride 0.9 %  145 mL  IVPB, , Intravenous,  Once, 1 of 4 cycles Administration:  (09/29/2019)  for chemotherapy treatment.      CURRENT THERAPY: Cisplatin and Gemcitabine 2 weeks on/1 week off starting 09/29/19  INTERVAL HISTORY: Mr. Vranich returns for f/u and treatment as scheduled. He completed cycle 1 day 1 on 09/29/19. He did not feel different after treatment. Gets dizzy intermittently if he gets too hot which is normal for him, this resolves quickly with rest. Denies fall. Baseline mild RUQ pain and nausea are stable. Anti-emetics help. Denies decreased appetite or po intake. BMs are normal. Denies GERD. Able to drink well. No mouth sores. Mild fatigue is stable, remains functional and able to do yard work. Denies fever, chills, cough, chest pain, dyspnea, bleeding, or neuropathy. Denies rash. Left lower leg is mildly swollen compared to right d/t gout in left knee, no change from baseline, no calf pain.   MEDICAL HISTORY:  Past Medical History:  Diagnosis Date  . Arthritis   . Diabetes ([place])   . GERD (gastroesophageal reflux disease)   . Hyperlipidemia   . Hypertension   . Intrahepatic cholangiocarcinoma ([place])     SURGICAL HISTORY: Past Surgical History:  Procedure Laterality Date  . APPENDECTOMY  1980  . COLONOSCOPY      I have reviewed the social history and family history with the patient and they are unchanged from previous note.  ALLERGIES:  has No Known Allergies.  MEDICATIONS:  Current Outpatient Medications  Medication Sig Dispense Refill  . allopurinol (ZYLOPRIM) 100 MG tablet Take 100 mg by mouth daily.    Marland Kitchen amLODipine (NORVASC) 2.5 MG tablet Take 2.5 mg by mouth daily.    Marland Kitchen aspirin EC 81 MG tablet Take 81 mg by mouth daily.    . famotidine (PEPCID) 40 MG tablet Take 0.5 tablets (20 mg total) by mouth 2 (two) times daily. 30 tablet 5  . fenofibrate (TRICOR) 145 MG tablet Take 145 mg by mouth daily.    . finasteride (PROSCAR) 5 MG tablet Take 5 mg by mouth daily.    Marland Kitchen lisinopril  (ZESTRIL) 40 MG tablet Take 40 mg by mouth daily.    . metFORMIN (GLUCOPHAGE) 500 MG tablet Take 500 mg by mouth 2 (two) times daily with a meal.     . ondansetron (ZOFRAN) 4 MG tablet Take 4 mg by mouth every 8 (eight) hours as needed for nausea or vomiting.    . ondansetron (ZOFRAN) 8 MG tablet Take 1 tablet (8 mg total) by mouth 2 (two) times daily as needed. Start on the third day after chemotherapy. 30 tablet 1  . pantoprazole (PROTONIX) 40 MG tablet TAKE 1 TABLET BY MOUTH TWICE A DAY 60 tablet 2  . pravastatin (PRAVACHOL) 40 MG tablet Take 40 mg by mouth daily.    . prochlorperazine (COMPAZINE) 10 MG tablet Take 1 tablet (10 mg total) by mouth every 6 (six) hours as needed (Nausea or vomiting). 30 tablet 1  . promethazine (PHENERGAN) 12.5 MG tablet TAKE 1 TABLET BY MOUTH EVERY 6 HOURS AS NEEDED FOR NAUSEA OR VOMITING. 20 tablet 1  . sucralfate (CARAFATE) 1 g tablet Take 1 tablet (1 g total) by mouth 4 (four) times daily -  with meals and at bedtime. 90 tablet 1  . tamsulosin (FLOMAX) 0.4 MG CAPS capsule Take 0.4 mg by mouth daily.    . Vitamin D, Ergocalciferol, (DRISDOL) 1.25 MG (50000 UT) CAPS capsule Take 50,000 Units by mouth every 7 (seven) days.  No current facility-administered medications for this visit.    Facility-Administered Medications Ordered in Other Visits  Medication Dose Route Frequency Provider Last Rate Last Dose  . CISplatin (PLATINOL) 54 mg in sodium chloride 0.9 % 250 mL chemo infusion  25 mg/m2 (Treatment Plan Recorded) Intravenous Once [name], MD      . gemcitabine (GEMZAR) 2,166 mg in sodium chloride 0.9 % 250 mL chemo infusion  1,000 mg/m2 (Treatment Plan Recorded) Intravenous Once [name], MD 614 mL/hr at 10/06/19 1227 2,166 mg at 10/06/19 1227    PHYSICAL EXAMINATION: ECOG PERFORMANCE STATUS: 1 - Symptomatic but completely ambulatory See infusion flow sheet for vital signs   GENERAL:alert, no distress and comfortable SKIN: no obvious rash  EYES:  sclera clear LUNGS: clear with normal breathing effort HEART: regular rate & rhythm, mild left lower leg edema, no calf tenderness  ABDOMEN:abdomen soft, round, non-tender and normal bowel sounds NEURO: alert & oriented x 3 with fluent speech  LABORATORY DATA:  I have reviewed the data as listed CBC Latest Ref Rng & Units 10/06/2019 09/29/2019 08/11/2019  WBC 4.0 - 10.5 K/uL 6.5 8.4 9.8  Hemoglobin 13.0 - 17.0 g/dL 14.6 13.8 16.1  Hematocrit 39.0 - 52.0 % 44.1 41.4 47.7  Platelets 150 - 400 K/uL 232 273 322     CMP Latest Ref Rng & Units 10/06/2019 09/29/2019 08/15/2019  Glucose 70 - 99 mg/dL 120(H) 145(H) 105(H)  BUN 8 - 23 mg/dL 13 12 13   Creatinine 0.61 - 1.24 mg/dL 0.95 1.04 1.02  Sodium 135 - 145 mmol/L 139 139 138  Potassium 3.5 - 5.1 mmol/L 3.9 3.9 3.6  Chloride 98 - 111 mmol/L 105 108 104  CO2 22 - 32 mmol/L 23 23 26   Calcium 8.9 - 10.3 mg/dL 9.0 8.6(L) 9.1  Total Protein 6.5 - 8.1 g/dL 5.7(L) 5.2(L) -  Total Bilirubin 0.3 - 1.2 mg/dL 0.3 0.4 -  Alkaline Phos 38 - 126 U/L 41 39 -  AST 15 - 41 U/L 15 10(L) -  ALT 0 - 44 U/L 18 11 -      RADIOGRAPHIC STUDIES: I have personally reviewed the radiological images as listed and agreed with the findings in the report. No results found.   ASSESSMENT & PLAN: DAVIER DOHMEN is a 73 y.o. male with   1.Intrahepatic cholangiocarcinoma, cT2N0Mx, unresectable, with indeterminate lung nodules -He was recently diagnosed in 07/2019. CT scans and MRI liver showa large7.3cm mass in the right hepatic lobe whichabutsportal vein.  -He was seen by our local surgeon Dr. Barry Dienes and Dr Carlis Abbott at [facility], both concluded that cancer is not resectable due to the invasion to portal vein. -He began systemic treatment per NCCN guideline standard first line chemo with IV Cisplatin and Gemcitabine 2 weeks on/1 week off. Started 09/29/19 -The goal of therapy is palliative to prolong his life -Mr. Harston tolerated first dose well without significant  toxicities. His baseline mild fatigue, nausea w/o vomiting, and RUQ pain are all stable. He remains able to function well and be active. Denies need for pain medication  -He had televist with Dr. Carlis Abbott on 11/12 to confirm that he had started chemo -Labs reviewed, CBC and CMP are adequate for treatment today -He will proceed with cycle 1 day 8 gemcitabine and cisplatin without dose modification today, then Fulphila. I reviewed rationale and potential side effects including bone pain and rare but serious splenic rupture. He will start claritin for pain. He agrees to proceed.  -he will proceed with  CT AP on 11/19 for new baseline -f/u in 2 weeks with cycle 2  2. Nausea, Low food intakeand weight loss -With pain from GERD and nausea he initially lost 30 pounds.  -he was previously seen by Dietician. His weight is stable overall, he is eating adequately.  -after C1D1, he denies GERD. Nausea is well managed, no vomiting. He notes good po intake last week after first treatment.  -monitor closely   3. DM, HTN, HLD, Gout  -On Metformin, amlodipine, lisinopril., allopurinol  -Continue to f/u with his PCP  -BG 120 today   4. Nicotine Use -He never smoked but has been chewing Tobacco for the past 60 years. He no longer drinks alcohol.  -He now chews 1-2 times a day. Has beenrepeatedly encouraged to quit   5.GERD and gastritis, history ofesophageal candidiasis -Hehad repeated EGDwith Dr. Havery Moros in 07/2019. 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  -Will monitor andmayrepeat EGDin future   PLAN: -Labs reviewed -Proceed with cycle 1 day 8 gemcitabine and cisplatin today, no dose modifications  -Day 9 Fulphila  -F/u in 2 weeks with cycle 2 -CT AP on 11/19   All questions were answered. The patient knows to call the clinic with any problems, questions or concerns. No barriers to  learning was detected.     Alla Feeling, NP 10/06/19

## 2019-10-06 ENCOUNTER — Encounter: Payer: Self-pay | Admitting: Nurse Practitioner

## 2019-10-06 ENCOUNTER — Other Ambulatory Visit: Payer: Self-pay

## 2019-10-06 ENCOUNTER — Inpatient Hospital Stay: Payer: Medicare HMO

## 2019-10-06 ENCOUNTER — Other Ambulatory Visit: Payer: Medicare HMO

## 2019-10-06 ENCOUNTER — Encounter: Payer: Self-pay | Admitting: Hematology

## 2019-10-06 ENCOUNTER — Inpatient Hospital Stay (HOSPITAL_BASED_OUTPATIENT_CLINIC_OR_DEPARTMENT_OTHER): Payer: Medicare HMO | Admitting: Nurse Practitioner

## 2019-10-06 VITALS — BP 132/72 | HR 61 | Temp 98.0°F | Resp 18

## 2019-10-06 DIAGNOSIS — Z5111 Encounter for antineoplastic chemotherapy: Secondary | ICD-10-CM | POA: Diagnosis not present

## 2019-10-06 DIAGNOSIS — R911 Solitary pulmonary nodule: Secondary | ICD-10-CM | POA: Diagnosis not present

## 2019-10-06 DIAGNOSIS — R110 Nausea: Secondary | ICD-10-CM | POA: Diagnosis not present

## 2019-10-06 DIAGNOSIS — I10 Essential (primary) hypertension: Secondary | ICD-10-CM | POA: Diagnosis not present

## 2019-10-06 DIAGNOSIS — K219 Gastro-esophageal reflux disease without esophagitis: Secondary | ICD-10-CM | POA: Diagnosis not present

## 2019-10-06 DIAGNOSIS — C221 Intrahepatic bile duct carcinoma: Secondary | ICD-10-CM

## 2019-10-06 DIAGNOSIS — Z5189 Encounter for other specified aftercare: Secondary | ICD-10-CM | POA: Diagnosis not present

## 2019-10-06 DIAGNOSIS — Z720 Tobacco use: Secondary | ICD-10-CM | POA: Diagnosis not present

## 2019-10-06 DIAGNOSIS — Z7189 Other specified counseling: Secondary | ICD-10-CM

## 2019-10-06 DIAGNOSIS — E119 Type 2 diabetes mellitus without complications: Secondary | ICD-10-CM | POA: Diagnosis not present

## 2019-10-06 LAB — CBC WITH DIFFERENTIAL (CANCER CENTER ONLY)
Abs Immature Granulocytes: 0.02 10*3/uL (ref 0.00–0.07)
Basophils Absolute: 0.1 10*3/uL (ref 0.0–0.1)
Basophils Relative: 2 %
Eosinophils Absolute: 0.3 10*3/uL (ref 0.0–0.5)
Eosinophils Relative: 5 %
HCT: 44.1 % (ref 39.0–52.0)
Hemoglobin: 14.6 g/dL (ref 13.0–17.0)
Immature Granulocytes: 0 %
Lymphocytes Relative: 35 %
Lymphs Abs: 2.3 10*3/uL (ref 0.7–4.0)
MCH: 30 pg (ref 26.0–34.0)
MCHC: 33.1 g/dL (ref 30.0–36.0)
MCV: 90.7 fL (ref 80.0–100.0)
Monocytes Absolute: 0.4 10*3/uL (ref 0.1–1.0)
Monocytes Relative: 6 %
Neutro Abs: 3.4 10*3/uL (ref 1.7–7.7)
Neutrophils Relative %: 52 %
Platelet Count: 232 10*3/uL (ref 150–400)
RBC: 4.86 MIL/uL (ref 4.22–5.81)
RDW: 12.5 % (ref 11.5–15.5)
WBC Count: 6.5 10*3/uL (ref 4.0–10.5)
nRBC: 0 % (ref 0.0–0.2)

## 2019-10-06 LAB — CMP (CANCER CENTER ONLY)
ALT: 18 U/L (ref 0–44)
AST: 15 U/L (ref 15–41)
Albumin: 3.3 g/dL — ABNORMAL LOW (ref 3.5–5.0)
Alkaline Phosphatase: 41 U/L (ref 38–126)
Anion gap: 11 (ref 5–15)
BUN: 13 mg/dL (ref 8–23)
CO2: 23 mmol/L (ref 22–32)
Calcium: 9 mg/dL (ref 8.9–10.3)
Chloride: 105 mmol/L (ref 98–111)
Creatinine: 0.95 mg/dL (ref 0.61–1.24)
GFR, Est AFR Am: >60 mL/min (ref >60)
GFR, Estimated: >60 mL/min (ref >60)
Glucose, Bld: 120 mg/dL — ABNORMAL HIGH (ref 70–99)
Potassium: 3.9 mmol/L (ref 3.5–5.1)
Sodium: 139 mmol/L (ref 135–145)
Total Bilirubin: 0.3 mg/dL (ref 0.3–1.2)
Total Protein: 5.7 g/dL — ABNORMAL LOW (ref 6.5–8.1)

## 2019-10-06 MED ORDER — SODIUM CHLORIDE 0.9 % IV SOLN
Freq: Once | INTRAVENOUS | Status: AC
  Administered 2019-10-06: 11:00:00 via INTRAVENOUS
  Filled 2019-10-06: qty 5

## 2019-10-06 MED ORDER — POTASSIUM CHLORIDE 2 MEQ/ML IV SOLN
Freq: Once | INTRAVENOUS | Status: AC
  Administered 2019-10-06: 09:00:00 via INTRAVENOUS
  Filled 2019-10-06: qty 10

## 2019-10-06 MED ORDER — PALONOSETRON HCL INJECTION 0.25 MG/5ML
0.2500 mg | Freq: Once | INTRAVENOUS | Status: AC
  Administered 2019-10-06: 11:00:00 0.25 mg via INTRAVENOUS

## 2019-10-06 MED ORDER — SODIUM CHLORIDE 0.9 % IV SOLN
Freq: Once | INTRAVENOUS | Status: AC
  Administered 2019-10-06: 09:00:00 via INTRAVENOUS
  Filled 2019-10-06: qty 250

## 2019-10-06 MED ORDER — SODIUM CHLORIDE 0.9 % IV SOLN
25.0000 mg/m2 | Freq: Once | INTRAVENOUS | Status: AC
  Administered 2019-10-06: 54 mg via INTRAVENOUS
  Filled 2019-10-06: qty 54

## 2019-10-06 MED ORDER — SODIUM CHLORIDE 0.9 % IV SOLN
1000.0000 mg/m2 | Freq: Once | INTRAVENOUS | Status: AC
  Administered 2019-10-06: 2166 mg via INTRAVENOUS
  Filled 2019-10-06: qty 56.97

## 2019-10-06 MED ORDER — PALONOSETRON HCL INJECTION 0.25 MG/5ML
INTRAVENOUS | Status: AC
  Filled 2019-10-06: qty 5

## 2019-10-06 NOTE — Progress Notes (Signed)
Met w/ pt to introduce myself as his Arboriculturist.  Unfortunately there aren't any foundations offering copay assistance for his Dx and the type of ins he has.  I offered the [employer] over what it covers and gave him the income.  He would like to think about it and contact me if he would like to apply.

## 2019-10-06 NOTE — Patient Instructions (Signed)
[facility] Discharge Instructions for Patients Receiving Chemotherapy  Today you received the following chemotherapy agents Gemzar and Cisplatin  To help prevent nausea and vomiting after your treatment, we encourage you to take your nausea medication as directed.   If you develop nausea and vomiting that is not controlled by your nausea medication, call the clinic.   BELOW ARE SYMPTOMS THAT SHOULD BE REPORTED IMMEDIATELY:  *FEVER GREATER THAN 100.5 F  *CHILLS WITH OR WITHOUT FEVER  NAUSEA AND VOMITING THAT IS NOT CONTROLLED WITH YOUR NAUSEA MEDICATION  *UNUSUAL SHORTNESS OF BREATH  *UNUSUAL BRUISING OR BLEEDING  TENDERNESS IN MOUTH AND THROAT WITH OR WITHOUT PRESENCE OF ULCERS  *URINARY PROBLEMS  *BOWEL PROBLEMS  UNUSUAL RASH Items with * indicate a potential emergency and should be followed up as soon as possible.  Feel free to call the clinic should you have any questions or concerns. The clinic phone number is [phone].  Please show the [facility] at check-in to the Emergency Department and triage nurse.  Gemcitabine (Gemzar) injection What is this medicine? GEMCITABINE (jem SYE ta been) is a chemotherapy drug. This medicine is used to treat many types of cancer like breast cancer, lung cancer, pancreatic cancer, and ovarian cancer. This medicine may be used for other purposes; ask your health care provider or pharmacist if you have questions. COMMON BRAND NAME(S): Gemzar, Infugem What should I tell my health care provider before I take this medicine? They need to know if you have any of these conditions:  blood disorders  infection  kidney disease  liver disease  lung or breathing disease, like asthma  recent or ongoing radiation therapy  an unusual or allergic reaction to gemcitabine, other chemotherapy, other medicines, foods, dyes, or preservatives  pregnant or trying to get pregnant  breast-feeding How should I use this  medicine? This drug is given as an infusion into a vein. It is administered in a hospital or clinic by a specially trained health care professional. Talk to your pediatrician regarding the use of this medicine in children. Special care may be needed. Overdosage: If you think you have taken too much of this medicine contact a poison control center or emergency room at once. NOTE: This medicine is only for you. Do not share this medicine with others. What if I miss a dose? It is important not to miss your dose. Call your doctor or health care professional if you are unable to keep an appointment. What may interact with this medicine?  medicines to increase blood counts like filgrastim, pegfilgrastim, sargramostim  some other chemotherapy drugs like cisplatin  vaccines Talk to your doctor or health care professional before taking any of these medicines:  acetaminophen  aspirin  ibuprofen  ketoprofen  naproxen This list may not describe all possible interactions. Give your health care provider a list of all the medicines, herbs, non-prescription drugs, or dietary supplements you use. Also tell them if you smoke, drink alcohol, or use illegal drugs. Some items may interact with your medicine. What should I watch for while using this medicine? Visit your doctor for checks on your progress. This drug may make you feel generally unwell. This is not uncommon, as chemotherapy can affect healthy cells as well as cancer cells. Report any side effects. Continue your course of treatment even though you feel ill unless your doctor tells you to stop. In some cases, you may be given additional medicines to help with side effects. Follow all directions for  their use. Call your doctor or health care professional for advice if you get a fever, chills or sore throat, or other symptoms of a cold or flu. Do not treat yourself. This drug decreases your body's ability to fight infections. Try to avoid being  around people who are sick. This medicine may increase your risk to bruise or bleed. Call your doctor or health care professional if you notice any unusual bleeding. Be careful brushing and flossing your teeth or using a toothpick because you may get an infection or bleed more easily. If you have any dental work done, tell your dentist you are receiving this medicine. Avoid taking products that contain aspirin, acetaminophen, ibuprofen, naproxen, or ketoprofen unless instructed by your doctor. These medicines may hide a fever. Do not become pregnant while taking this medicine or for 6 months after stopping it. Women should inform their doctor if they wish to become pregnant or think they might be pregnant. Men should not father a child while taking this medicine and for 3 months after stopping it. There is a potential for serious side effects to an unborn child. Talk to your health care professional or pharmacist for more information. Do not breast-feed an infant while taking this medicine or for at least 1 week after stopping it. Men should inform their doctors if they wish to father a child. This medicine may lower sperm counts. Talk with your doctor or health care professional if you are concerned about your fertility. What side effects may I notice from receiving this medicine? Side effects that you should report to your doctor or health care professional as soon as possible:  allergic reactions like skin rash, itching or hives, swelling of the face, lips, or tongue  breathing problems  pain, redness, or irritation at site where injected  signs and symptoms of a dangerous change in heartbeat or heart rhythm like chest pain; dizziness; fast or irregular heartbeat; palpitations; feeling faint or lightheaded, falls; breathing problems  signs of decreased platelets or bleeding - bruising, pinpoint red spots on the skin, black, tarry stools, blood in the urine  signs of decreased red blood cells -  unusually weak or tired, feeling faint or lightheaded, falls  signs of infection - fever or chills, cough, sore throat, pain or difficulty passing urine  signs and symptoms of kidney injury like trouble passing urine or change in the amount of urine  signs and symptoms of liver injury like dark yellow or brown urine; general ill feeling or flu-like symptoms; light-colored stools; loss of appetite; nausea; right upper belly pain; unusually weak or tired; yellowing of the eyes or skin  swelling of ankles, feet, hands Side effects that usually do not require medical attention (report to your doctor or health care professional if they continue or are bothersome):  constipation  diarrhea  hair loss  loss of appetite  nausea  rash  vomiting This list may not describe all possible side effects. Call your doctor for medical advice about side effects. You may report side effects to FDA at 1-800-FDA-1088. Where should I keep my medicine? This drug is given in a hospital or clinic and will not be stored at home. NOTE: This sheet is a summary. It may not cover all possible information. If you have questions about this medicine, talk to your doctor, pharmacist, or health care provider.  2020 [employer]/Gold Standard (2018-01-30 18:06:11)  Cisplatin injection What is this medicine? CISPLATIN (SIS pla tin) is a chemotherapy drug. It targets fast   dividing cells, like cancer cells, and causes these cells to die. This medicine is used to treat many types of cancer like bladder, ovarian, and testicular cancers. This medicine may be used for other purposes; ask your health care provider or pharmacist if you have questions. COMMON BRAND NAME(S): Platinol, Platinol -AQ What should I tell my health care provider before I take this medicine? They need to know if you have any of these conditions:  blood disorders  hearing problems  kidney disease  recent or ongoing radiation therapy  an unusual  or allergic reaction to cisplatin, carboplatin, other chemotherapy, other medicines, foods, dyes, or preservatives  pregnant or trying to get pregnant  breast-feeding How should I use this medicine? This drug is given as an infusion into a vein. It is administered in a hospital or clinic by a specially trained health care professional. Talk to your pediatrician regarding the use of this medicine in children. Special care may be needed. Overdosage: If you think you have taken too much of this medicine contact a poison control center or emergency room at once. NOTE: This medicine is only for you. Do not share this medicine with others. What if I miss a dose? It is important not to miss a dose. Call your doctor or health care professional if you are unable to keep an appointment. What may interact with this medicine?  dofetilide  foscarnet  medicines for seizures  medicines to increase blood counts like filgrastim, pegfilgrastim, sargramostim  probenecid  pyridoxine used with altretamine  rituximab  some antibiotics like amikacin, gentamicin, neomycin, polymyxin B, streptomycin, tobramycin  sulfinpyrazone  vaccines  zalcitabine Talk to your doctor or health care professional before taking any of these medicines:  acetaminophen  aspirin  ibuprofen  ketoprofen  naproxen This list may not describe all possible interactions. Give your health care provider a list of all the medicines, herbs, non-prescription drugs, or dietary supplements you use. Also tell them if you smoke, drink alcohol, or use illegal drugs. Some items may interact with your medicine. What should I watch for while using this medicine? Your condition will be monitored carefully while you are receiving this medicine. You will need important blood work done while you are taking this medicine. This drug may make you feel generally unwell. This is not uncommon, as chemotherapy can affect healthy cells as well  as cancer cells. Report any side effects. Continue your course of treatment even though you feel ill unless your doctor tells you to stop. In some cases, you may be given additional medicines to help with side effects. Follow all directions for their use. Call your doctor or health care professional for advice if you get a fever, chills or sore throat, or other symptoms of a cold or flu. Do not treat yourself. This drug decreases your body's ability to fight infections. Try to avoid being around people who are sick. This medicine may increase your risk to bruise or bleed. Call your doctor or health care professional if you notice any unusual bleeding. Be careful brushing and flossing your teeth or using a toothpick because you may get an infection or bleed more easily. If you have any dental work done, tell your dentist you are receiving this medicine. Avoid taking products that contain aspirin, acetaminophen, ibuprofen, naproxen, or ketoprofen unless instructed by your doctor. These medicines may hide a fever. Do not become pregnant while taking this medicine. Women should inform their doctor if they wish to become pregnant or think  they might be pregnant. There is a potential for serious side effects to an unborn child. Talk to your health care professional or pharmacist for more information. Do not breast-feed an infant while taking this medicine. Drink fluids as directed while you are taking this medicine. This will help protect your kidneys. Call your doctor or health care professional if you get diarrhea. Do not treat yourself. What side effects may I notice from receiving this medicine? Side effects that you should report to your doctor or health care professional as soon as possible:  allergic reactions like skin rash, itching or hives, swelling of the face, lips, or tongue  signs of infection - fever or chills, cough, sore throat, pain or difficulty passing urine  signs of decreased  platelets or bleeding - bruising, pinpoint red spots on the skin, black, tarry stools, nosebleeds  signs of decreased red blood cells - unusually weak or tired, fainting spells, lightheadedness  breathing problems  changes in hearing  gout pain  low blood counts - This drug may decrease the number of white blood cells, red blood cells and platelets. You may be at increased risk for infections and bleeding.  nausea and vomiting  pain, swelling, redness or irritation at the injection site  pain, tingling, numbness in the hands or feet  problems with balance, movement  trouble passing urine or change in the amount of urine Side effects that usually do not require medical attention (report to your doctor or health care professional if they continue or are bothersome):  changes in vision  loss of appetite  metallic taste in the mouth or changes in taste This list may not describe all possible side effects. Call your doctor for medical advice about side effects. You may report side effects to FDA at 1-800-FDA-1088. Where should I keep my medicine? This drug is given in a hospital or clinic and will not be stored at home. NOTE: This sheet is a summary. It may not cover all possible information. If you have questions about this medicine, talk to your doctor, pharmacist, or health care provider.  2020 [employer]vier/Gold Standard (2008-02-11 14:40:54)

## 2019-10-07 ENCOUNTER — Telehealth: Payer: Self-pay | Admitting: Nurse Practitioner

## 2019-10-07 NOTE — Telephone Encounter (Signed)
Had already scheduled appts per 11/16 los.

## 2019-10-09 ENCOUNTER — Other Ambulatory Visit: Payer: Self-pay

## 2019-10-09 ENCOUNTER — Encounter (HOSPITAL_COMMUNITY): Payer: Self-pay

## 2019-10-09 ENCOUNTER — Ambulatory Visit (HOSPITAL_COMMUNITY)
Admission: RE | Admit: 2019-10-09 | Discharge: 2019-10-09 | Disposition: A | Discharge status: Home / Self Care | Payer: Medicare HMO | Source: Ambulatory Visit | Attending: Hematology | Admitting: Hematology

## 2019-10-09 ENCOUNTER — Inpatient Hospital Stay: Payer: Medicare HMO

## 2019-10-09 VITALS — BP 141/72 | HR 66 | Temp 97.8°F | Resp 16

## 2019-10-09 DIAGNOSIS — E119 Type 2 diabetes mellitus without complications: Secondary | ICD-10-CM | POA: Diagnosis not present

## 2019-10-09 DIAGNOSIS — R110 Nausea: Secondary | ICD-10-CM | POA: Diagnosis not present

## 2019-10-09 DIAGNOSIS — I10 Essential (primary) hypertension: Secondary | ICD-10-CM | POA: Diagnosis not present

## 2019-10-09 DIAGNOSIS — D490 Neoplasm of unspecified behavior of digestive system: Secondary | ICD-10-CM | POA: Insufficient documentation

## 2019-10-09 DIAGNOSIS — K219 Gastro-esophageal reflux disease without esophagitis: Secondary | ICD-10-CM | POA: Diagnosis not present

## 2019-10-09 DIAGNOSIS — Z7189 Other specified counseling: Secondary | ICD-10-CM

## 2019-10-09 DIAGNOSIS — Z5111 Encounter for antineoplastic chemotherapy: Secondary | ICD-10-CM | POA: Diagnosis not present

## 2019-10-09 DIAGNOSIS — C221 Intrahepatic bile duct carcinoma: Secondary | ICD-10-CM | POA: Diagnosis not present

## 2019-10-09 DIAGNOSIS — Z720 Tobacco use: Secondary | ICD-10-CM | POA: Diagnosis not present

## 2019-10-09 DIAGNOSIS — Z5189 Encounter for other specified aftercare: Secondary | ICD-10-CM | POA: Diagnosis not present

## 2019-10-09 DIAGNOSIS — R911 Solitary pulmonary nodule: Secondary | ICD-10-CM | POA: Diagnosis not present

## 2019-10-09 IMAGING — CT CT ABD-PELV W/ CM
2 of 9 series · 13 of 46 positions shown, 15 images · IV contrast (OMNIPAQUE)
Comparison: Multiple exams, including [DATE] and MRI [DATE]

CLINICAL DATA: Restaging of right hepatic lobe cholangiocarcinoma

EXAM:
CT ABDOMEN AND PELVIS WITH CONTRAST
TECHNIQUE: Multidetector CT imaging of the abdomen and pelvis was performed
using the standard protocol following bolus administration of
intravenous contrast.
CONTRAST:  100mL OMNIPAQUE IOHEXOL 300 MG/ML  SOLN

[Series 3: coronal arterial · coronal · arterial · 0.55mm/px · 3 of 114 slices shown]
[im 29/114  soft-tissue]
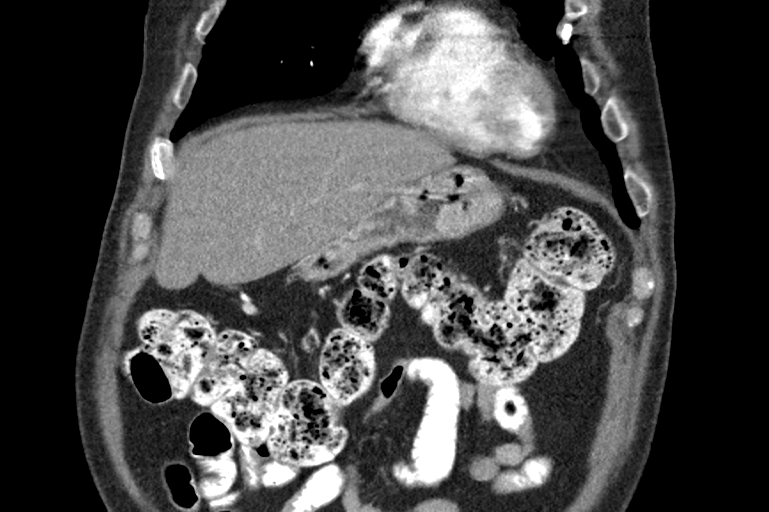
[im 57/114  soft-tissue]
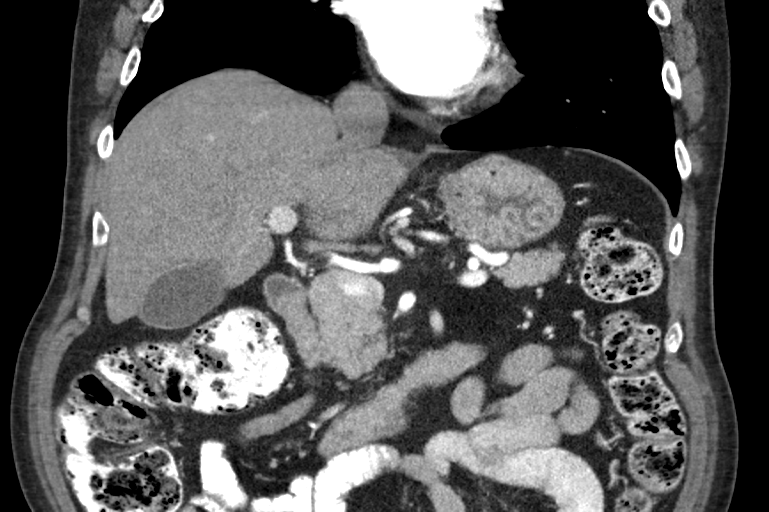
[im 85/114  soft-tissue]
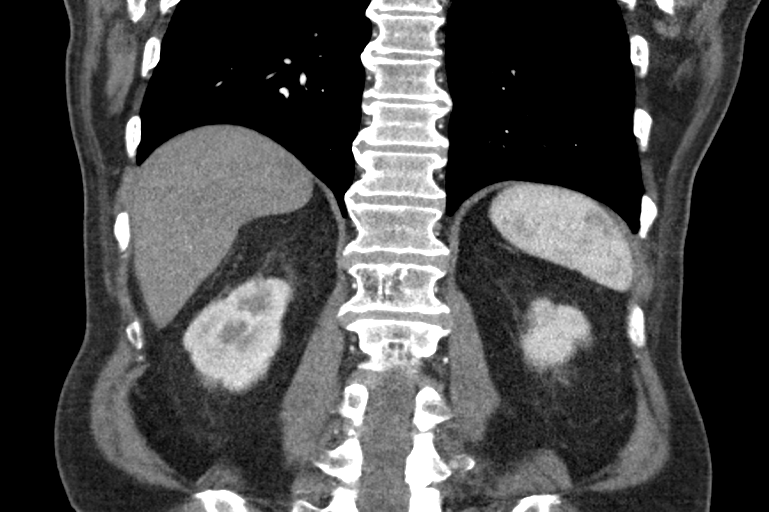

[Series 7: axial venous · axial · portal-venous · 0.83mm/px · z∈[-479,-74]mm · 10 of 167 slices shown, 12 images]
[im 16/167  soft-tissue]
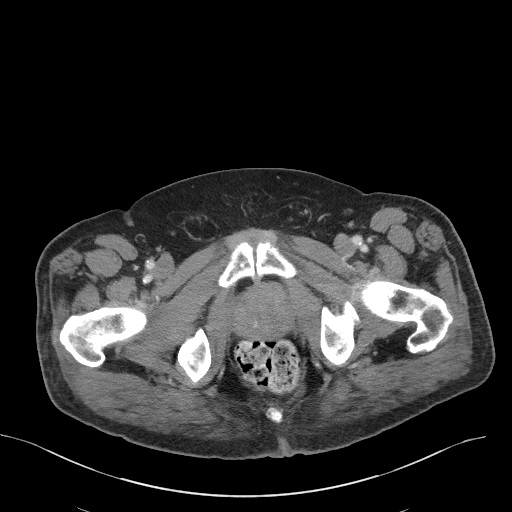
[im 16/167  bone]
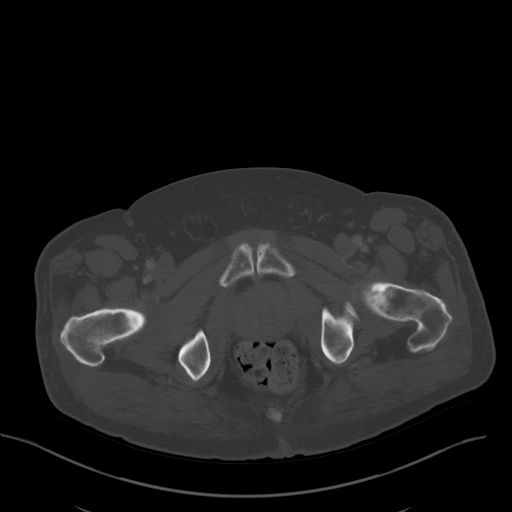
[im 31/167  soft-tissue]
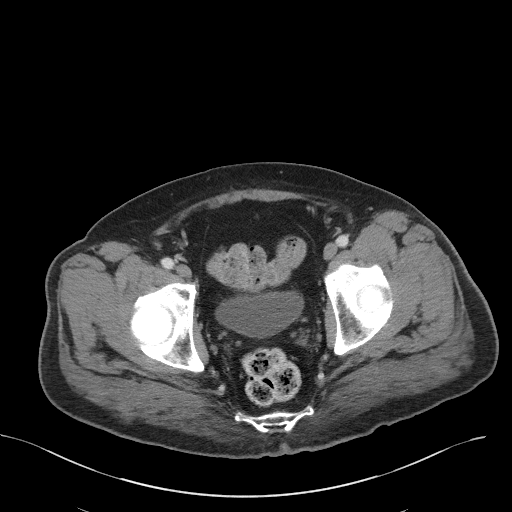
[im 46/167  soft-tissue]
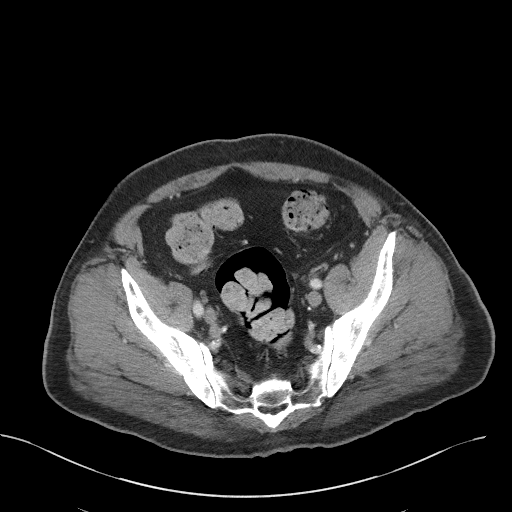
[im 61/167  soft-tissue]
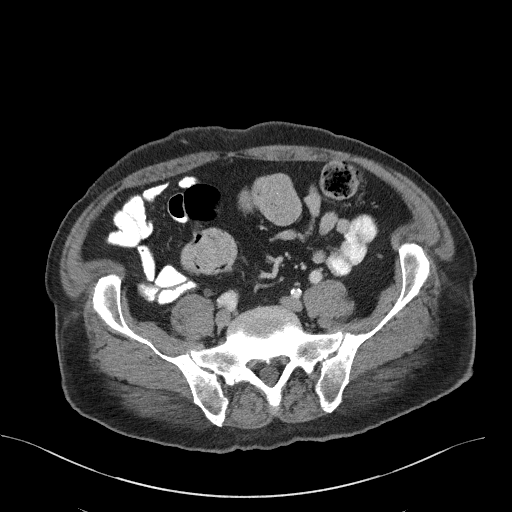
[im 76/167  soft-tissue]
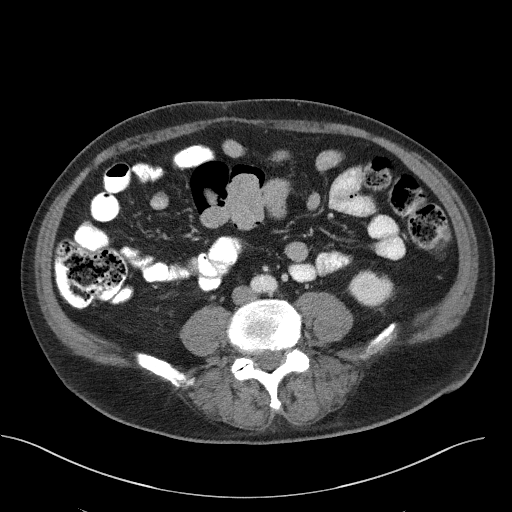
[im 91/167  soft-tissue]
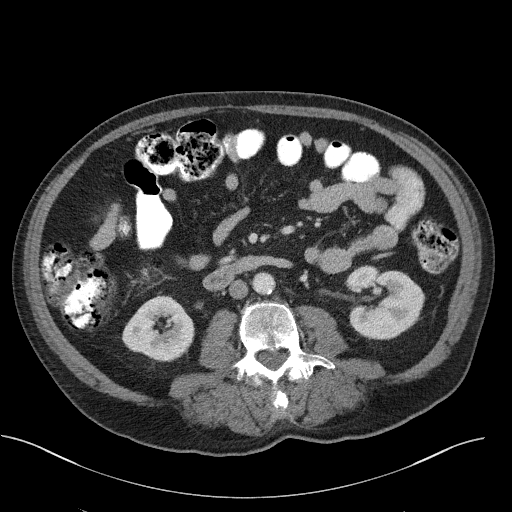
[im 106/167  soft-tissue]
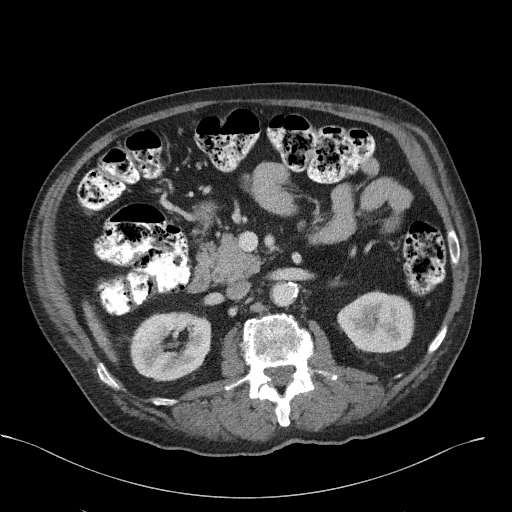
[im 121/167  soft-tissue]
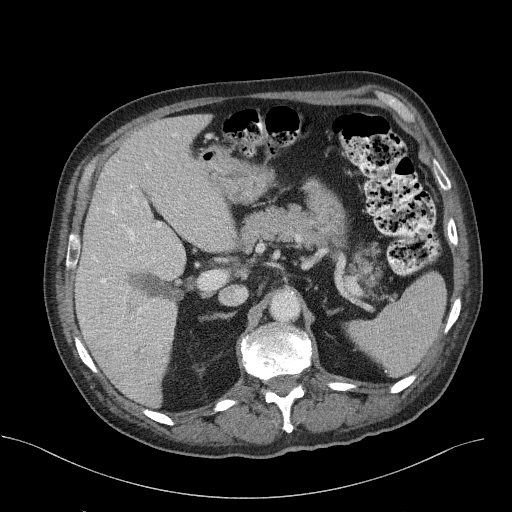
[im 136/167  soft-tissue]
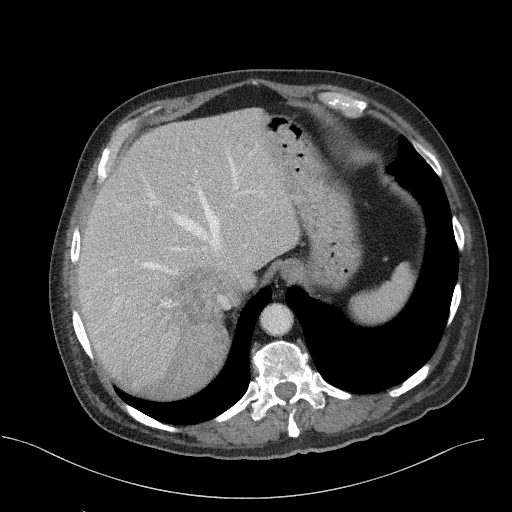
[im 136/167  bone]
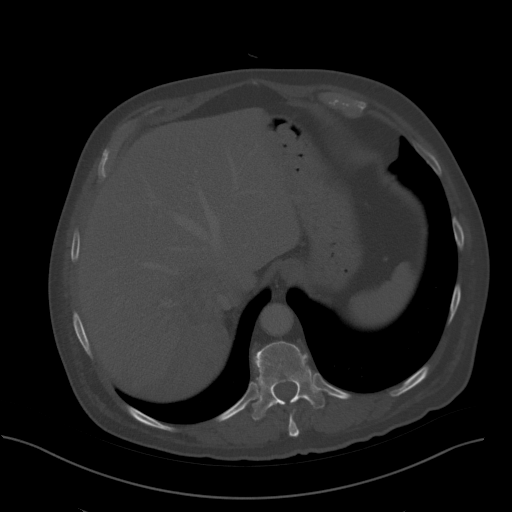
[im 151/167  soft-tissue]
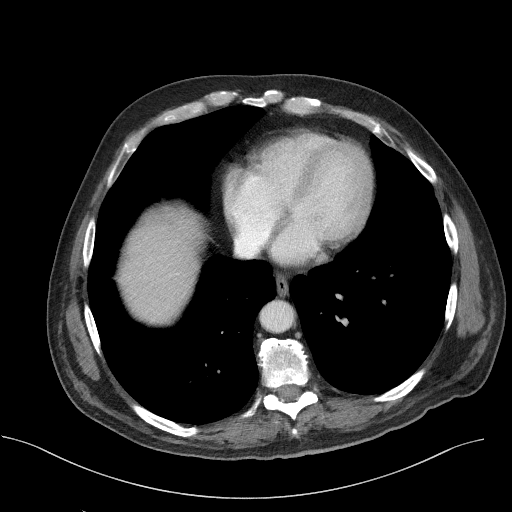

[13 of 46 positions shown; findings below may reference images not displayed]

FINDINGS: Lower chest: 0.4 cm right middle lobe pulmonary nodule on image
[DATE], stable compared to the earliest available comparison of
[DATE]. No new nodules are identified.

Left main, left anterior descending, and proximal circumflex
coronary artery atherosclerotic vascular disease.

Hepatobiliary: Ill-defined right hepatic lobe mass spanning into the
caudate lobe with the dominant component of the mass measuring
by 5.3 cm, previously 6.2 by 5.6 cm on [DATE] which is the most
suitable comparison. This measured larger on the MRI but I was
including the adjacent peripheral nodule on the prior MRI
measurement, if measuring in a similar manner on previous MRI I
arrive at 6.2 by 5.5 cm. Accordingly the lesion may be minimally
reduced in long axis compared to prior exams.

Reduced conspicuity of the two smaller peripheral nodules compared
to MRI although this nodules are still believed to be present and
faintly appreciable on image 32/7.

New hypodense lesion centrally in the right hepatic lobe measuring
1.1 by 0.8 cm on image 37/7, a new small focus of tumor is likely.
This lesion is not visible on prior MRI.

There continue to be downstream vascular affects likely related to
right hepatic vein attenuation or obstruction. The tumor is in close
proximity with the intrahepatic portion of the IVC and wraps around
most of the circumference of the IVC by RHINO of its location.

Mild gallbladder wall thickening. Gallstones are present including a
1.1 cm gallstone on image 54/7.

Pancreas: Unremarkable

Spleen: Old granulomatous disease.

Adrenals/Urinary Tract: Small hypodense renal lesions favoring cysts
but technically too small to characterize. Adrenal glands normal.

Stomach/Bowel: Prominent stool throughout the colon favors
constipation.

Vascular/Lymphatic: Aortoiliac atherosclerotic vascular disease. No
pathologic adenopathy identified.

Reproductive: Moderate prostatomegaly with heterogeneous enhancement
of the prostate gland. This is nonspecific. Correlation with PSA
levels suggested.

Other: No supplemental non-categorized findings.

Musculoskeletal: Lower thoracic spondylosis. Lumbar spondylosis and
degenerative disc disease causing mild bilateral foraminal
impingement at L3-4 and L4-5.
IMPRESSION: 1. The dominant right hepatic lobe mass is minimally reduced in size
compared to prior exams, currently measuring 6.2 by 5.3 cm,
previously 6.2 by 5.6 cm. However, there is a new small hypodense
lesion centrally in the right hepatic lobe which is suspicious for a
new small focus of tumor. Accordingly this is an overall mixed
appearance.
2. Continued hypoenhancement in the liver downstream of the tumor
likely attributable to narrowing or occlusion of the right hepatic
vein by the tumor. By RHINO of its location the tumor wraps around
the intrahepatic portion of the IVC.
3. 4 mm right middle lobe pulmonary nodule, stable compared to
earliest available comparison of [DATE]. Surveillance of the
patient's pulmonary nodules is recommended.
4. Other imaging findings of potential clinical significance:
Coronary atherosclerosis. Cholelithiasis. Prominent stool throughout
the colon favors constipation. Moderate prostatomegaly with
heterogeneous enhancement of the prostate gland. Lumbar spondylosis
and degenerative disc disease causing mild bilateral foraminal
impingement at L3-4 and L4-5.

Aortic Atherosclerosis ([CJ]-[CJ]).

## 2019-10-09 MED ORDER — IOHEXOL 300 MG/ML  SOLN
100.0000 mL | Freq: Once | INTRAMUSCULAR | Status: AC | PRN
  Administered 2019-10-09: 100 mL via INTRAVENOUS

## 2019-10-09 MED ORDER — SODIUM CHLORIDE (PF) 0.9 % IJ SOLN
INTRAMUSCULAR | Status: AC
  Filled 2019-10-09: qty 50

## 2019-10-09 MED ORDER — PEGFILGRASTIM-JMDB 6 MG/0.6ML ~~LOC~~ SOSY
6.0000 mg | PREFILLED_SYRINGE | Freq: Once | SUBCUTANEOUS | Status: AC
  Administered 2019-10-09: 08:00:00 6 mg via SUBCUTANEOUS

## 2019-10-09 MED ORDER — PEGFILGRASTIM-JMDB 6 MG/0.6ML ~~LOC~~ SOSY
PREFILLED_SYRINGE | SUBCUTANEOUS | Status: AC
  Filled 2019-10-09: qty 0.6

## 2019-10-09 NOTE — Patient Instructions (Signed)
Begin taking over-the-counter Claritin today and the next 3 days. Do not use Claritin-D.  Pegfilgrastim injection What is this medicine? PEGFILGRASTIM (PEG fil gra stim) is a long-acting granulocyte colony-stimulating factor that stimulates the growth of neutrophils, a type of white blood cell important in the body's fight against infection. It is used to reduce the incidence of fever and infection in patients with certain types of cancer who are receiving chemotherapy that affects the bone marrow, and to increase survival after being exposed to high doses of radiation. This medicine may be used for other purposes; ask your health care provider or pharmacist if you have questions. COMMON BRAND NAME(S): Steve Rattler, Ziextenzo What should I tell my health care provider before I take this medicine? They need to know if you have any of these conditions:  kidney disease  latex allergy  ongoing radiation therapy  sickle cell disease  skin reactions to acrylic adhesives (On-Body Injector only)  an unusual or allergic reaction to pegfilgrastim, filgrastim, other medicines, foods, dyes, or preservatives  pregnant or trying to get pregnant  breast-feeding How should I use this medicine? This medicine is for injection under the skin. If you get this medicine at home, you will be taught how to prepare and give the pre-filled syringe or how to use the On-body Injector. Refer to the patient Instructions for Use for detailed instructions. Use exactly as directed. Tell your healthcare provider immediately if you suspect that the On-body Injector may not have performed as intended or if you suspect the use of the On-body Injector resulted in a missed or partial dose. It is important that you put your used needles and syringes in a special sharps container. Do not put them in a trash can. If you do not have a sharps container, call your pharmacist or healthcare provider to get one. Talk to  your pediatrician regarding the use of this medicine in children. While this drug may be prescribed for selected conditions, precautions do apply. Overdosage: If you think you have taken too much of this medicine contact a poison control center or emergency room at once. NOTE: This medicine is only for you. Do not share this medicine with others. What if I miss a dose? It is important not to miss your dose. Call your doctor or health care professional if you miss your dose. If you miss a dose due to an On-body Injector failure or leakage, a new dose should be administered as soon as possible using a single prefilled syringe for manual use. What may interact with this medicine? Interactions have not been studied. Give your health care provider a list of all the medicines, herbs, non-prescription drugs, or dietary supplements you use. Also tell them if you smoke, drink alcohol, or use illegal drugs. Some items may interact with your medicine. This list may not describe all possible interactions. Give your health care provider a list of all the medicines, herbs, non-prescription drugs, or dietary supplements you use. Also tell them if you smoke, drink alcohol, or use illegal drugs. Some items may interact with your medicine. What should I watch for while using this medicine? You may need blood work done while you are taking this medicine. If you are going to need a MRI, CT scan, or other procedure, tell your doctor that you are using this medicine (On-Body Injector only). What side effects may I notice from receiving this medicine? Side effects that you should report to your doctor or health care professional as  soon as possible:  allergic reactions like skin rash, itching or hives, swelling of the face, lips, or tongue  back pain  dizziness  fever  pain, redness, or irritation at site where injected  pinpoint red spots on the skin  red or dark-brown urine  shortness of breath or breathing  problems  stomach or side pain, or pain at the shoulder  swelling  tiredness  trouble passing urine or change in the amount of urine Side effects that usually do not require medical attention (report to your doctor or health care professional if they continue or are bothersome):  bone pain  muscle pain This list may not describe all possible side effects. Call your doctor for medical advice about side effects. You may report side effects to FDA at 1-800-FDA-1088. Where should I keep my medicine? Keep out of the reach of children. If you are using this medicine at home, you will be instructed on how to store it. Throw away any unused medicine after the expiration date on the label. NOTE: This sheet is a summary. It may not cover all possible information. If you have questions about this medicine, talk to your doctor, pharmacist, or health care provider.  2020 Elsevier/Gold Standard (2018-02-11 16:57:08)   Phlebitis Phlebitis is soreness and swelling (inflammation) of a vein. Follow these instructions at home: Managing pain, stiffness, and swelling  If told, apply heat to the affected area. Do this as often as told by your doctor. Use the heat source that your doctor tells you to use. This may include a moist heat pack or a heating pad. ? Place a towel between your skin and the heat source. ? Leave the heat on for 20-30 minutes. ? Take off the heat if your skin turns bright red. This is very important if you cannot feel pain, heat, or cold. You may be more likely to get burned.  Raise (elevate) the affected area above the level of your heart while you are sitting or lying down. Medicines  Take over-the-counter and prescription medicines only as told by your doctor.  If you were prescribed an antibiotic medicine, take it as told by your doctor. Do not stop taking the antibiotic even if your condition gets better.  If you take medicines to thin your blood, carry a medical alert  card or wear your medical alert jewelry. General instructions   If you have phlebitis in your legs: ? Do not stand or sit for a long time. ? Keep your legs moving. ? Get up and take short walks if you have to sit for a long time. ? Try to avoid bed rest that lasts for a long time. Regular sleep is not bed rest.  Wear compression stockings as told by your doctor. These stockings help: ? To reduce swelling in your legs. ? To prevent blood clots. ? To stop the condition from coming back.  Do not use any products that contain nicotine or tobacco, such as cigarettes and e-cigarettes. If you need help quitting, ask your doctor.  Keep all follow-up visits as told by your doctor. This is important. This may include any follow-up blood tests. Contact a doctor if:  You have strange bruises.  You have bleeding problems.  Your symptoms do not get better.  Your symptoms get worse.  You are taking medicine to treat swelling (anti-inflammatory medicine) and you get belly (abdominal) pain. Get help right away if:  You have sudden chest pain.  You suddenly have trouble breathing.  You have a fever and your symptoms get worse.  You cough up blood.  You feel dizzy or you pass out.  You have very bad pain and swelling in the affected arm or leg. These symptoms may be an emergency. Do not wait to see if the symptoms will go away. Get medical help right away. Call your local emergency services (911 in the [place] Do not drive yourself to the hospital. Summary  Phlebitis is soreness and swelling (inflammation) of a vein.  Raise (elevate) the affected area above the level of your heart while you are sitting or lying down.  If told, apply heat to the affected area. Do this as often as told by your doctor. Use the heat source that your doctor tells you to use. This may include a moist heat pack or a heating pad.  Take over-the-counter and prescription medicines only as told by your doctor.  This information is not intended to replace advice given to you by your health care provider. Make sure you discuss any questions you have with your health care provider. Document Released: 10/25/2009 Document Revised: 12/17/2018 Document Reviewed: 12/12/2016 Elsevier Patient Education  2020 [employer].

## 2019-10-09 NOTE — Progress Notes (Signed)
Patient presented to treatment area for Fulphila injection. Complaining of right arm pain where IV was placed for 1st tx on 09/29/2019. Cord-like vessel palpated, tender to palpation, and erythematous. Dr. Burr Medico evaluated and instructions given. Patient verbalized understanding.

## 2019-10-10 ENCOUNTER — Ambulatory Visit: Payer: Medicare HMO | Admitting: Hematology

## 2019-10-10 ENCOUNTER — Other Ambulatory Visit: Payer: Medicare HMO

## 2019-10-10 ENCOUNTER — Ambulatory Visit: Payer: Medicare HMO

## 2019-10-19 NOTE — Progress Notes (Signed)
Brandon Sherman   Telephone:([phone] Fax:(336) [phone]   Clinic Follow up Note   Patient Care Team: [name], PA as PCP - General (Family Medicine) [name], MD as Consulting Physician (General Surgery) [name], [name], MD as Consulting Physician (Gastroenterology) [name], MD as Consulting Physician (Hematology) 10/20/2019  CHIEF COMPLAINT: F/u intrahepatic cholangiocarcinoma   SUMMARY OF ONCOLOGIC HISTORY: Oncology History Overview Note  Cancer Staging Intrahepatic cholangiocarcinoma ([place]) Staging form: Intrahepatic Bile Duct, AJCC 8th Edition - Clinical stage from 08/19/2019: Stage II (cT2, cN0, cM0) - Signed by [name], MD on 08/19/2019    Intrahepatic cholangiocarcinoma ([place])  06/28/2019 Imaging   CT AP W Contrast 06/28/19  IMPRESSION: 1. Heterogeneous hypodensity posteriorly in the right hepatic lobe and potentially extending into the caudate lobe suspicious for a mass. There is felt to be truncation of branches of the portal vein in this vicinity and some narrowing of the hepatic vein, as well as triangular-shaped regions of abnormal hypoenhancement posteriorly in the right hepatic lobe likely representing downstream vascular effects. Cannot exclude malignancy such as cholangiocarcinoma or hepatocellular carcinoma, and follow up hepatic protocol MRI with and without contrast is recommended to further characterize. 2. 4 mm right middle lobe pulmonary nodule is likely benign but may merit surveillance. 3. Cholelithiasis. 4.  Aortic Atherosclerosis (ICD10-I70.0). 5. Prostatomegaly. 6. Mild impingement at L3-4 and L4-5.   07/31/2019 Imaging   MRI Liver 07/31/19 IMPRESSION: 1. 7.3 cm in long axis mass in the right hepatic lobe spanning into the caudate lobe, high suspicion for malignancy such as hepatocellular carcinoma or cholangiocarcinoma. Suspected effacement or occlusion of the right hepatic vein and posterior branches of the right  portal vein. Two smaller tumor nodules along the posterior periphery of the dominant mass. Tissue diagnosis is recommended. 2. No findings of pathologic adenopathy or distant metastatic spread. 3. 9 mm gallstone in the gallbladder. There is mild gallbladder wall thickening which may be from nondistention, correlate clinically in assessing for cholecystitis. 4.  Aortic Atherosclerosis (ICD10-I70.0). 5. Mild diffuse hepatic steatosis.   08/11/2019 Initial Biopsy   DIAGNOSIS: 08/11/19  A. LIVER, RIGHT, BIOPSY:  - Adenocarcinoma.   08/18/2019 Imaging   CT Chest 08/18/19  IMPRESSION: 1. Multiple pulmonary nodules largest at approximately 7 mm in the right lower lobe, nonspecific but concerning given findings in the liver. 2. No signs of definitive metastatic disease, also with mildly enlarged upper abdominal lymph nodes as discussed.   Aortic Atherosclerosis (ICD10-I70.0).   08/19/2019 Initial Diagnosis   Intrahepatic cholangiocarcinoma ([place])   08/19/2019 Cancer Staging   Staging form: Intrahepatic Bile Duct, AJCC 8th Edition - Clinical stage from 08/19/2019: Stage II (cT2, cN0, cM0) - Signed by [name], MD on 08/19/2019   09/29/2019 -  Chemotherapy   Cisplatin and Gemcitabine 2 weeks on/1 week off starting 09/29/19    09/29/2019 -  Chemotherapy   The patient had palonosetron (ALOXI) injection 0.25 mg, 0.25 mg, Intravenous,  Once, 2 of 4 cycles Administration: 0.25 mg (09/29/2019), 0.25 mg (10/06/2019) pegfilgrastim-jmdb (FULPHILA) injection 6 mg, 6 mg, Subcutaneous,  Once, 2 of 4 cycles Administration: 6 mg (10/09/2019) CISplatin (PLATINOL) 54 mg in sodium chloride 0.9 % 250 mL chemo infusion, 25 mg/m2 = 54 mg, Intravenous,  Once, 2 of 4 cycles Administration: 54 mg (09/29/2019), 54 mg (10/06/2019) gemcitabine (GEMZAR) 2,166 mg in sodium chloride 0.9 % 250 mL chemo infusion, 1,000 mg/m2 = 2,166 mg, Intravenous,  Once, 2 of 4 cycles Administration: 2,166 mg (09/29/2019), 2,166 mg  (  10/06/2019) fosaprepitant (EMEND) 150 mg, dexamethasone (DECADRON) 12 mg in sodium chloride 0.9 % 145 mL IVPB, , Intravenous,  Once, 2 of 4 cycles Administration:  (09/29/2019),  (10/06/2019)  for chemotherapy treatment.    10/09/2019 Imaging   CT AP IMPRESSION: 1. The dominant right hepatic lobe mass is minimally reduced in size compared to prior exams, currently measuring 6.2 by 5.3 cm, previously 6.2 by 5.6 cm. However, there is a new small hypodense lesion centrally in the right hepatic lobe which is suspicious for a new small focus of tumor. Accordingly this is an overall mixed appearance. 2. Continued hypoenhancement in the liver downstream of the tumor likely attributable to narrowing or occlusion of the right hepatic vein by the tumor. By virtue of its location the tumor wraps around the intrahepatic portion of the IVC. 3. 4 mm right middle lobe pulmonary nodule, stable compared to earliest available comparison of 07/18/2019. Surveillance of the patient's pulmonary nodules is recommended. 4. Other imaging findings of potential clinical significance: Coronary atherosclerosis. Cholelithiasis. Prominent stool throughout the colon favors constipation. Moderate prostatomegaly with heterogeneous enhancement of the prostate gland. Lumbar spondylosis and degenerative disc disease causing mild bilateral foraminal impingement at L3-4 and L4-5.   Aortic Atherosclerosis (ICD10-I70.0).     CURRENT THERAPY: Cisplatin and Gemcitabine 2 weeks on/1 week off starting 09/29/19  INTERVAL HISTORY: Brandon Sherman returns for f/u and treatment as scheduled. Brandon Sherman completed cycle 1 day 8 on 10/06/19 on Fulphila on 10/09/19. Brandon Sherman had no issues with chemo. Brandon Sherman had painful redness and swelling to left forearm after last chemo that resolved after applying heat. His appetite is normal and gaining weight. Denies n/v/c/d. Denies new or worsening abdominal pain. No recent fever, chills, cough, chest pain, dyspnea, or new leg  swelling. Left leg always swells due to gout. Denies mucositis. Denies rash. Denies neuropathy. Brandon Sherman is sleeping well.    MEDICAL HISTORY:  Past Medical History:  Diagnosis Date   Arthritis    Diabetes ([place])    GERD (gastroesophageal reflux disease)    Hyperlipidemia    Hypertension    Intrahepatic cholangiocarcinoma ([place])     SURGICAL HISTORY: Past Surgical History:  Procedure Laterality Date   APPENDECTOMY  1980   COLONOSCOPY      I have reviewed the social history and family history with the patient and they are unchanged from previous note.  ALLERGIES:  has No Known Allergies.  MEDICATIONS:  Current Outpatient Medications  Medication Sig Dispense Refill   allopurinol (ZYLOPRIM) 100 MG tablet Take 100 mg by mouth daily.     amLODipine (NORVASC) 2.5 MG tablet Take 2.5 mg by mouth daily.     aspirin EC 81 MG tablet Take 81 mg by mouth daily.     famotidine (PEPCID) 40 MG tablet Take 0.5 tablets (20 mg total) by mouth 2 (two) times daily. 30 tablet 5   fenofibrate (TRICOR) 145 MG tablet Take 145 mg by mouth daily.     finasteride (PROSCAR) 5 MG tablet Take 5 mg by mouth daily.     lisinopril (ZESTRIL) 40 MG tablet Take 40 mg by mouth daily.     metFORMIN (GLUCOPHAGE) 500 MG tablet Take 500 mg by mouth 2 (two) times daily with a meal.      ondansetron (ZOFRAN) 4 MG tablet Take 4 mg by mouth every 8 (eight) hours as needed for nausea or vomiting.     ondansetron (ZOFRAN) 8 MG tablet Take 1 tablet (8 mg total) by mouth 2 (two) times  daily as needed. Start on the third day after chemotherapy. 30 tablet 1   pantoprazole (PROTONIX) 40 MG tablet TAKE 1 TABLET BY MOUTH TWICE A DAY 60 tablet 2   pravastatin (PRAVACHOL) 40 MG tablet Take 40 mg by mouth daily.     prochlorperazine (COMPAZINE) 10 MG tablet Take 1 tablet (10 mg total) by mouth every 6 (six) hours as needed (Nausea or vomiting). 30 tablet 1   promethazine (PHENERGAN) 12.5 MG tablet TAKE 1 TABLET BY  MOUTH EVERY 6 HOURS AS NEEDED FOR NAUSEA OR VOMITING. 20 tablet 1   sucralfate (CARAFATE) 1 g tablet Take 1 tablet (1 g total) by mouth 4 (four) times daily -  with meals and at bedtime. 90 tablet 1   tamsulosin (FLOMAX) 0.4 MG CAPS capsule Take 0.4 mg by mouth daily.     Vitamin D, Ergocalciferol, (DRISDOL) 1.25 MG (50000 UT) CAPS capsule Take 50,000 Units by mouth every 7 (seven) days.     No current facility-administered medications for this visit.    Facility-Administered Medications Ordered in Other Visits  Medication Dose Route Frequency Provider Last Rate Last Dose   0.9 %  sodium chloride infusion   Intravenous Once [name], MD       dextrose 5 % and 0.45% NaCl 1,000 mL with potassium chloride 20 mEq, magnesium sulfate 12 mEq infusion   Intravenous Once [name], MD        PHYSICAL EXAMINATION: ECOG PERFORMANCE STATUS: 0 - Asymptomatic  Vitals:   10/20/19 0839  BP: 131/77  Pulse: 90  Resp: 18  Temp: 97.7 F (36.5 C)  SpO2: 100%   Filed Weights   10/20/19 0839  Weight: 215 lb 8 oz (97.8 kg)    GENERAL:alert, no distress and comfortable SKIN:no rash  EYES: sclera clear OROPHARYNX: no thrush or ulcers NECK: without mass LUNGS: clear with normal breathing effort HEART: regular rate & rhythm ABDOMEN: abdomen soft, non-tender and normal bowel sounds Musculoskeletal: firmness to left forearm, no swelling or erythema. LLE edema  NEURO: alert & oriented x 3 with fluent speech, no focal motor/sensory deficits  LABORATORY DATA:  I have reviewed the data as listed CBC Latest Ref Rng & Units 10/20/2019 10/06/2019 09/29/2019  WBC 4.0 - 10.5 K/uL 21.0(H) 6.5 8.4  Hemoglobin 13.0 - 17.0 g/dL 13.2 14.6 13.8  Hematocrit 39.0 - 52.0 % 41.0 44.1 41.4  Platelets 150 - 400 K/uL 322 232 273     CMP Latest Ref Rng & Units 10/20/2019 10/06/2019 09/29/2019  Glucose 70 - 99 mg/dL 128(H) 120(H) 145(H)  BUN 8 - 23 mg/dL 11 13 12   Creatinine 0.61 - 1.24 mg/dL 0.98 0.95 1.04    Sodium 135 - 145 mmol/L 138 139 139  Potassium 3.5 - 5.1 mmol/L 4.2 3.9 3.9  Chloride 98 - 111 mmol/L 105 105 108  CO2 22 - 32 mmol/L 23 23 23   Calcium 8.9 - 10.3 mg/dL 8.7(L) 9.0 8.6(L)  Total Protein 6.5 - 8.1 g/dL 5.8(L) 5.7(L) 5.2(L)  Total Bilirubin 0.3 - 1.2 mg/dL 0.2(L) 0.3 0.4  Alkaline Phos 38 - 126 U/L 70 41 39  AST 15 - 41 U/L 17 15 10(L)  ALT 0 - 44 U/L 20 18 11       RADIOGRAPHIC STUDIES: I have personally reviewed the radiological images as listed and agreed with the findings in the report. No results found.   ASSESSMENT & PLAN: Brandon Sherman a 73 y.o.malewith   1.Intrahepatic cholangiocarcinoma, cT2N0Mx,unresectable,with indeterminate lung nodules -Brandon Sherman was  recently diagnosed in 07/2019.CT scans and MRI liver showa large7.3cm mass in the right hepatic lobe whichabutsportal vein.  -Brandon Sherman was seen byour local surgeon Dr. Barry Dienes andDr Carlis Abbott [facility] concluded that cancer is not resectable due to the invasion to portal vein. -Brandon Sherman began systemic treatment per NCCNguideline standardfirst line chemo with IV Cisplatin and Gemcitabine 2 weeks on/1 week off. Started 09/29/19 -The goal of therapy is palliative to prolong his life -Brandon Sherman had CT AP on 11/19 to establish new baseline which shows the dominant right hepatic lobe mass is stable to minimally reduced. There is a new small hypodense central lesion that is suspicious for new tumor focus. Lung nodules are stable. Dr. Burr Medico called the patient to review these results and I discussed this with him today. Will monitor going forward. Treatment goal and plan remain the same.  -Mr. Myler appears stable today. Brandon Sherman tolerated cycle 1 very well without significant toxicities.  -Brandon Sherman had phlebitis that resolved with heat. We reviewed risk/benefit of PAC placement, Brandon Sherman still wants to hold for now. -Labs reviewed, WBC is 21 from [place], otherwise normal. CMP and Mg stable. Labs adequate for treatment -Brandon Sherman will proceed with  cycle 2 day 1 cis/gemcitabine today, no dosage adjustments  -f/u and day 8 next week   2. Nausea, Low food intakeand weight loss -With pain from GERD and nausea Brandon Sherman initially lost 30 pounds. -Brandon Sherman waspreviouslyseen by Dietician, Brandon Sherman is eating adequately. -Gaining weight  3. DM, HTN, HLD, Gout  -On Metformin, amlodipine, lisinopril., allopurinol  -Continue to f/u with his PCP  -BG 128 today, stable   4. Nicotine Use -Brandon Sherman never smoked but has been chewing Tobacco for the past 60 years.Brandon Sherman no longer drinks alcohol. -Brandon Sherman now chews 1-2 times a day. Has beenrepeatedly encouraged to quit   5.GERDand gastritis, history ofesophageal candidiasis -Hehad repeated EGDwith Dr. Havery Moros in 07/2019. 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  -mayrepeat EGDin future -On carafate and PPI, symptoms resolved -Monitoring    PLAN: -Labs, CT reviewed -Proceed with cycle 2 day 1 gemcitabine/cisplatin today -Reviewed PAC, patient declined  -Return for f/u and day 8 next week   All questions were answered. The patient knows to call the clinic with any problems, questions or concerns. No barriers to learning was detected.     Alla Feeling, NP 10/20/19

## 2019-10-20 ENCOUNTER — Other Ambulatory Visit: Payer: Self-pay

## 2019-10-20 ENCOUNTER — Telehealth: Payer: Self-pay | Admitting: Nurse Practitioner

## 2019-10-20 ENCOUNTER — Inpatient Hospital Stay: Payer: Medicare HMO

## 2019-10-20 ENCOUNTER — Telehealth: Payer: Self-pay | Admitting: Hematology

## 2019-10-20 ENCOUNTER — Inpatient Hospital Stay (HOSPITAL_BASED_OUTPATIENT_CLINIC_OR_DEPARTMENT_OTHER): Payer: Medicare HMO | Admitting: Nurse Practitioner

## 2019-10-20 ENCOUNTER — Encounter: Payer: Self-pay | Admitting: Nurse Practitioner

## 2019-10-20 VITALS — BP 131/77 | HR 90 | Temp 97.7°F | Resp 18 | Ht 69.0 in | Wt 215.5 lb

## 2019-10-20 DIAGNOSIS — I10 Essential (primary) hypertension: Secondary | ICD-10-CM | POA: Diagnosis not present

## 2019-10-20 DIAGNOSIS — C221 Intrahepatic bile duct carcinoma: Secondary | ICD-10-CM | POA: Diagnosis not present

## 2019-10-20 DIAGNOSIS — Z5189 Encounter for other specified aftercare: Secondary | ICD-10-CM | POA: Diagnosis not present

## 2019-10-20 DIAGNOSIS — R110 Nausea: Secondary | ICD-10-CM | POA: Diagnosis not present

## 2019-10-20 DIAGNOSIS — Z720 Tobacco use: Secondary | ICD-10-CM | POA: Diagnosis not present

## 2019-10-20 DIAGNOSIS — Z7189 Other specified counseling: Secondary | ICD-10-CM

## 2019-10-20 DIAGNOSIS — E119 Type 2 diabetes mellitus without complications: Secondary | ICD-10-CM | POA: Diagnosis not present

## 2019-10-20 DIAGNOSIS — K219 Gastro-esophageal reflux disease without esophagitis: Secondary | ICD-10-CM | POA: Diagnosis not present

## 2019-10-20 DIAGNOSIS — Z5111 Encounter for antineoplastic chemotherapy: Secondary | ICD-10-CM | POA: Diagnosis not present

## 2019-10-20 DIAGNOSIS — R911 Solitary pulmonary nodule: Secondary | ICD-10-CM | POA: Diagnosis not present

## 2019-10-20 LAB — CBC WITH DIFFERENTIAL (CANCER CENTER ONLY)
Abs Immature Granulocytes: 1.66 10*3/uL — ABNORMAL HIGH (ref 0.00–0.07)
Basophils Absolute: 0.1 10*3/uL (ref 0.0–0.1)
Basophils Relative: 0 %
Eosinophils Absolute: 0.1 10*3/uL (ref 0.0–0.5)
Eosinophils Relative: 0 %
HCT: 41 % (ref 39.0–52.0)
Hemoglobin: 13.2 g/dL (ref 13.0–17.0)
Immature Granulocytes: 8 %
Lymphocytes Relative: 12 %
Lymphs Abs: 2.4 10*3/uL (ref 0.7–4.0)
MCH: 29.7 pg (ref 26.0–34.0)
MCHC: 32.2 g/dL (ref 30.0–36.0)
MCV: 92.3 fL (ref 80.0–100.0)
Monocytes Absolute: 1.2 10*3/uL — ABNORMAL HIGH (ref 0.1–1.0)
Monocytes Relative: 6 %
Neutro Abs: 15.6 10*3/uL — ABNORMAL HIGH (ref 1.7–7.7)
Neutrophils Relative %: 74 %
Platelet Count: 322 10*3/uL (ref 150–400)
RBC: 4.44 MIL/uL (ref 4.22–5.81)
RDW: 14.2 % (ref 11.5–15.5)
WBC Count: 21 10*3/uL — ABNORMAL HIGH (ref 4.0–10.5)
nRBC: 0.1 % (ref 0.0–0.2)

## 2019-10-20 LAB — CMP (CANCER CENTER ONLY)
ALT: 20 U/L (ref 0–44)
AST: 17 U/L (ref 15–41)
Albumin: 3.3 g/dL — ABNORMAL LOW (ref 3.5–5.0)
Alkaline Phosphatase: 70 U/L (ref 38–126)
Anion gap: 10 (ref 5–15)
BUN: 11 mg/dL (ref 8–23)
CO2: 23 mmol/L (ref 22–32)
Calcium: 8.7 mg/dL — ABNORMAL LOW (ref 8.9–10.3)
Chloride: 105 mmol/L (ref 98–111)
Creatinine: 0.98 mg/dL (ref 0.61–1.24)
GFR, Est AFR Am: >60 mL/min (ref >60)
GFR, Estimated: >60 mL/min (ref >60)
Glucose, Bld: 128 mg/dL — ABNORMAL HIGH (ref 70–99)
Potassium: 4.2 mmol/L (ref 3.5–5.1)
Sodium: 138 mmol/L (ref 135–145)
Total Bilirubin: 0.2 mg/dL — ABNORMAL LOW (ref 0.3–1.2)
Total Protein: 5.8 g/dL — ABNORMAL LOW (ref 6.5–8.1)

## 2019-10-20 LAB — MAGNESIUM: Magnesium: 1.7 mg/dL (ref 1.7–2.4)

## 2019-10-20 MED ORDER — SODIUM CHLORIDE 0.9 % IV SOLN
Freq: Once | INTRAVENOUS | Status: AC
  Administered 2019-10-20: 10:00:00 via INTRAVENOUS
  Filled 2019-10-20: qty 250

## 2019-10-20 MED ORDER — SODIUM CHLORIDE 0.9 % IV SOLN
Freq: Once | INTRAVENOUS | Status: AC
  Administered 2019-10-20: 12:00:00 via INTRAVENOUS
  Filled 2019-10-20: qty 5

## 2019-10-20 MED ORDER — PALONOSETRON HCL INJECTION 0.25 MG/5ML
INTRAVENOUS | Status: AC
  Filled 2019-10-20: qty 5

## 2019-10-20 MED ORDER — SODIUM CHLORIDE 0.9 % IV SOLN
Freq: Once | INTRAVENOUS | Status: AC
  Administered 2019-10-20: 14:00:00 via INTRAVENOUS
  Filled 2019-10-20: qty 250

## 2019-10-20 MED ORDER — SODIUM CHLORIDE 0.9 % IV SOLN
25.0000 mg/m2 | Freq: Once | INTRAVENOUS | Status: AC
  Administered 2019-10-20: 54 mg via INTRAVENOUS
  Filled 2019-10-20: qty 54

## 2019-10-20 MED ORDER — POTASSIUM CHLORIDE 2 MEQ/ML IV SOLN
Freq: Once | INTRAVENOUS | Status: AC
  Administered 2019-10-20: 10:00:00 via INTRAVENOUS
  Filled 2019-10-20: qty 10

## 2019-10-20 MED ORDER — PALONOSETRON HCL INJECTION 0.25 MG/5ML
0.2500 mg | Freq: Once | INTRAVENOUS | Status: AC
  Administered 2019-10-20: 0.25 mg via INTRAVENOUS

## 2019-10-20 MED ORDER — SODIUM CHLORIDE 0.9 % IV SOLN
1000.0000 mg/m2 | Freq: Once | INTRAVENOUS | Status: AC
  Administered 2019-10-20: 2166 mg via INTRAVENOUS
  Filled 2019-10-20: qty 56.97

## 2019-10-20 NOTE — Progress Notes (Signed)
Per Dr. Burr Medico ok to run post cisplatin fluids along with Cisplatin

## 2019-10-20 NOTE — Telephone Encounter (Signed)
Added injection 12/8. Confirmed with wife. Other appointments remain the same.

## 2019-10-20 NOTE — Telephone Encounter (Signed)
Scheduled appt per 11/30 los

## 2019-10-24 ENCOUNTER — Ambulatory Visit: Payer: Medicare HMO

## 2019-10-24 ENCOUNTER — Other Ambulatory Visit: Payer: Medicare HMO

## 2019-10-24 ENCOUNTER — Ambulatory Visit: Payer: Medicare HMO | Admitting: Hematology

## 2019-10-24 NOTE — Progress Notes (Signed)
[place]   [phone] [phone]   Clinic Follow up Note   Patient Care Team: [name], PA as PCP - General (Family Medicine) [name], MD as Consulting Physician (General Surgery) [name], [name], MD as Consulting Physician (Gastroenterology) [name], MD as Consulting Physician (Hematology)  Date of Service:  10/27/2019  CHIEF COMPLAINT: F/u of liver cancer   SUMMARY OF ONCOLOGIC HISTORY: Oncology History Overview Note  Cancer Staging Intrahepatic cholangiocarcinoma ([place]) Staging form: Intrahepatic Bile Duct, AJCC 8th Edition - Clinical stage from 08/19/2019: Stage II (cT2, cN0, cM0) - Signed by [name], MD on 08/19/2019    Intrahepatic cholangiocarcinoma ([place])  06/28/2019 Imaging   CT AP W Contrast 06/28/19  IMPRESSION: 1. Heterogeneous hypodensity posteriorly in the right hepatic lobe and potentially extending into the caudate lobe suspicious for a mass. There is felt to be truncation of branches of the portal vein in this vicinity and some narrowing of the hepatic vein, as well as triangular-shaped regions of abnormal hypoenhancement posteriorly in the right hepatic lobe likely representing downstream vascular effects. Cannot exclude malignancy such as cholangiocarcinoma or hepatocellular carcinoma, and follow up hepatic protocol MRI with and without contrast is recommended to further characterize. 2. 4 mm right middle lobe pulmonary nodule is likely benign but may merit surveillance. 3. Cholelithiasis. 4.  Aortic Atherosclerosis (ICD10-I70.0). 5. Prostatomegaly. 6. Mild impingement at L3-4 and L4-5.   07/31/2019 Imaging   MRI Liver 07/31/19 IMPRESSION: 1. 7.3 cm in long axis mass in the right hepatic lobe spanning into the caudate lobe, high suspicion for malignancy such as hepatocellular carcinoma or cholangiocarcinoma. Suspected effacement or occlusion of the right hepatic vein and posterior branches of the  right portal vein. Two smaller tumor nodules along the posterior periphery of the dominant mass. Tissue diagnosis is recommended. 2. No findings of pathologic adenopathy or distant metastatic spread. 3. 9 mm gallstone in the gallbladder. There is mild gallbladder wall thickening which may be from nondistention, correlate clinically in assessing for cholecystitis. 4.  Aortic Atherosclerosis (ICD10-I70.0). 5. Mild diffuse hepatic steatosis.   08/11/2019 Initial Biopsy   DIAGNOSIS: 08/11/19  A. LIVER, RIGHT, BIOPSY:  - Adenocarcinoma.   08/18/2019 Imaging   CT Chest 08/18/19  IMPRESSION: 1. Multiple pulmonary nodules largest at approximately 7 mm in the right lower lobe, nonspecific but concerning given findings in the liver. 2. No signs of definitive metastatic disease, also with mildly enlarged upper abdominal lymph nodes as discussed.   Aortic Atherosclerosis (ICD10-I70.0).   08/19/2019 Initial Diagnosis   Intrahepatic cholangiocarcinoma ([place])   08/19/2019 Cancer Staging   Staging form: Intrahepatic Bile Duct, AJCC 8th Edition - Clinical stage from 08/19/2019: Stage II (cT2, cN0, cM0) - Signed by [name], MD on 08/19/2019   09/29/2019 -  Chemotherapy   Cisplatin and Gemcitabine 2 weeks on/1 week off starting 09/29/19    10/09/2019 Imaging   CT AP IMPRESSION: 1. The dominant right hepatic lobe mass is minimally reduced in size compared to prior exams, currently measuring 6.2 by 5.3 cm, previously 6.2 by 5.6 cm. However, there is a new small hypodense lesion centrally in the right hepatic lobe which is suspicious for a new small focus of tumor. Accordingly this is an overall mixed appearance. 2. Continued hypoenhancement in the liver downstream of the tumor likely attributable to narrowing or occlusion of the right hepatic vein by the tumor. By virtue of its location the tumor wraps around the intrahepatic portion of the IVC. 3. 4  mm right middle lobe pulmonary nodule, stable  compared to earliest available comparison of 07/18/2019. Surveillance of the patient's pulmonary nodules is recommended. 4. Other imaging findings of potential clinical significance: Coronary atherosclerosis. Cholelithiasis. Prominent stool throughout the colon favors constipation. Moderate prostatomegaly with heterogeneous enhancement of the prostate gland. Lumbar spondylosis and degenerative disc disease causing mild bilateral foraminal impingement at L3-4 and L4-5.   Aortic Atherosclerosis (ICD10-I70.0).      CURRENT THERAPY:  Cisplatin and Gemcitabine 2 weeks on/1 week off starting 09/29/19  INTERVAL HISTORY:  Brandon Sherman is here for a follow up and treatment. He presents to the clinic alone. He notes is doing well. He notes after first 3 infusions he has not had any issues, no N&V and his appetite is adequate. He notes having burning of lower back when being active in the yard. This is not new but has a burning sensation for pain. This is mild and has not require OTC pain medications.     REVIEW OF SYSTEMS:   Constitutional: Denies fevers, chills or abnormal weight loss Eyes: Denies blurriness of vision Ears, nose, mouth, throat, and face: Denies mucositis or sore throat Respiratory: Denies cough, dyspnea or wheezes Cardiovascular: Denies palpitation, chest discomfort or lower extremity swelling Gastrointestinal:  Denies nausea, heartburn or change in bowel habits Skin: Denies abnormal skin rashes Lymphatics: Denies new lymphadenopathy or easy bruising Neurological:Denies numbness, tingling or new weaknesses Behavioral/Psych: Mood is stable, no new changes  All other systems were reviewed with the patient and are negative.  MEDICAL HISTORY:  Past Medical History:  Diagnosis Date  . Arthritis   . Diabetes ([place])   . GERD (gastroesophageal reflux disease)   . Hyperlipidemia   . Hypertension   . Intrahepatic cholangiocarcinoma ([place])     SURGICAL HISTORY: Past Surgical  History:  Procedure Laterality Date  . APPENDECTOMY  1980  . COLONOSCOPY      I have reviewed the social history and family history with the patient and they are unchanged from previous note.  ALLERGIES:  has No Known Allergies.  MEDICATIONS:  Current Outpatient Medications  Medication Sig Dispense Refill  . allopurinol (ZYLOPRIM) 100 MG tablet Take 100 mg by mouth daily.    Marland Kitchen amLODipine (NORVASC) 2.5 MG tablet Take 2.5 mg by mouth daily.    Marland Kitchen aspirin EC 81 MG tablet Take 81 mg by mouth daily.    . famotidine (PEPCID) 40 MG tablet Take 0.5 tablets (20 mg total) by mouth 2 (two) times daily. 30 tablet 5  . fenofibrate (TRICOR) 145 MG tablet Take 145 mg by mouth daily.    . finasteride (PROSCAR) 5 MG tablet Take 5 mg by mouth daily.    Marland Kitchen lisinopril (ZESTRIL) 40 MG tablet Take 40 mg by mouth daily.    . metFORMIN (GLUCOPHAGE) 500 MG tablet Take 500 mg by mouth 2 (two) times daily with a meal.     . ondansetron (ZOFRAN) 4 MG tablet Take 4 mg by mouth every 8 (eight) hours as needed for nausea or vomiting.    . ondansetron (ZOFRAN) 8 MG tablet Take 1 tablet (8 mg total) by mouth 2 (two) times daily as needed. Start on the third day after chemotherapy. 30 tablet 1  . pantoprazole (PROTONIX) 40 MG tablet TAKE 1 TABLET BY MOUTH TWICE A DAY 60 tablet 2  . pravastatin (PRAVACHOL) 40 MG tablet Take 40 mg by mouth daily.    . prochlorperazine (COMPAZINE) 10 MG tablet Take 1 tablet (10 mg total)  by mouth every 6 (six) hours as needed (Nausea or vomiting). 30 tablet 1  . promethazine (PHENERGAN) 12.5 MG tablet TAKE 1 TABLET BY MOUTH EVERY 6 HOURS AS NEEDED FOR NAUSEA OR VOMITING. 20 tablet 1  . sucralfate (CARAFATE) 1 g tablet Take 1 tablet (1 g total) by mouth 4 (four) times daily -  with meals and at bedtime. 90 tablet 1  . tamsulosin (FLOMAX) 0.4 MG CAPS capsule Take 0.4 mg by mouth daily.    . Vitamin D, Ergocalciferol, (DRISDOL) 1.25 MG (50000 UT) CAPS capsule Take 50,000 Units by mouth every 7  (seven) days.     No current facility-administered medications for this visit.     PHYSICAL EXAMINATION: ECOG PERFORMANCE STATUS: 1 - Symptomatic but completely ambulatory  Vitals:   10/27/19 0820  BP: 125/73  Pulse: 83  Resp: 18  Temp: 98.5 F (36.9 C)  SpO2: 100%   Filed Weights   10/27/19 0820  Weight: 212 lb 14.4 oz (96.6 kg)    GENERAL:alert, no distress and comfortable SKIN: skin color, texture, turgor are normal, no rashes or significant lesions EYES: normal, Conjunctiva are pink and non-injected, sclera clear  NECK: supple, thyroid normal size, non-tender, without nodularity LYMPH:  no palpable lymphadenopathy in the cervical, axillary  LUNGS: clear to auscultation and percussion with normal breathing effort HEART: regular rate & rhythm and no murmurs and no lower extremity edema ABDOMEN:abdomen soft, non-tender and normal bowel sounds Musculoskeletal:no cyanosis of digits and no clubbing  NEURO: alert & oriented x 3 with fluent speech, no focal motor/sensory deficits  LABORATORY DATA:  I have reviewed the data as listed CBC Latest Ref Rng & Units 10/27/2019 10/20/2019 10/06/2019  WBC 4.0 - 10.5 K/uL 11.2(H) 21.0(H) 6.5  Hemoglobin 13.0 - 17.0 g/dL 12.7(L) 13.2 14.6  Hematocrit 39.0 - 52.0 % 37.6(L) 41.0 44.1  Platelets 150 - 400 K/uL 403(H) 322 232     CMP Latest Ref Rng & Units 10/27/2019 10/20/2019 10/06/2019  Glucose 70 - 99 mg/dL 110(H) 128(H) 120(H)  BUN 8 - 23 mg/dL 19 11 13   Creatinine 0.61 - 1.24 mg/dL 1.06 0.98 0.95  Sodium 135 - 145 mmol/L 134(L) 138 139  Potassium 3.5 - 5.1 mmol/L 4.5 4.2 3.9  Chloride 98 - 111 mmol/L 104 105 105  CO2 22 - 32 mmol/L 22 23 23   Calcium 8.9 - 10.3 mg/dL 9.0 8.7(L) 9.0  Total Protein 6.5 - 8.1 g/dL 6.0(L) 5.8(L) 5.7(L)  Total Bilirubin 0.3 - 1.2 mg/dL 0.3 0.2(L) 0.3  Alkaline Phos 38 - 126 U/L 59 70 41  AST 15 - 41 U/L 18 17 15   ALT 0 - 44 U/L 24 20 18       RADIOGRAPHIC STUDIES: I have personally reviewed  the radiological images as listed and agreed with the findings in the report. No results found.   ASSESSMENT & PLAN:  MARKEVIUS DIELEMAN is a 73 y.o. male with   1.Intrahepatic cholangiocarcinoma, cT2N0Mx, unresectable, with indeterminate lung nodules -He was recently diagnosed in 07/2019. CT scans and MRI liver showa large7.3cm mass in the right hepatic lobe whichabutsportal vein.  -He was seen by our local surgeon Dr. Barry Dienes and Dr Carlis Abbott at [facility], both concluded that cancer is not resectable due to the invasion to portal vein. -I discussed given his cancer is non-resectable his cancer is likely not curable but still treatable. I started him on standard first line chemo with IV Cisplatin and Gemcitabine 2 weeks on/1 week off beginning 09/29/19. -FO results  are still pending.  -S/p C2D1 he has tolerated treatment with no or concerning issues so far. Labs reviewed, Blood counts have held up and slightly increased. Overall adequate to proceed with C2D8 Cisplatin and Gemcitabine today.  -Plan to scan him after C4.  -F/u in 2 weeks with NP Lacie.    2. Nausea, Low food intakeand weight loss -With pain from GERD and nausea he initially lost 30 pounds.  -His nausea and GERD is based on what he eats. Controlled on Phenergan, Sucralfate and Pepcid.  -He was previously seen by Dietician.  -No current nausea or vomiting on chemo and using less antiemetics. His weight is stable as he is eating adequately. Overall improved   3. DM, HTN, HLD, Gout  -On Metformin, amlodipine, lisinopril., allopurinol  -Continue to f/u with his PCP  -Will monitor with treatment.  We discussed that chemotherapy may impact his sugar level and blood pressure, I may adjust his medication if needed.  4. Nicotine Use -He never smoked but has been chewing Tobacco for the past 60 years. He no longer drinks alcohol.  -He now chews 1-2 times a day.Iagain encouraged him to reduce and quit completely.   5.GERD and  gastritis, history ofesophageal candidiasis -Hehad repeated EGDwith Dr. Havery Moros in 07/2019. 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  -Will monitor andmayrepeat EGDin future   PLAN: -Labs reviewed and adequate to proceed with C2D8 Cis/Gem today, Fulphila injection tomorrow  -He will see Lacie in 2 weeks and me in 5 weeks before cycle 3 and 4   No problem-specific Assessment & Plan notes found for this encounter.   No orders of the defined types were placed in this encounter.  All questions were answered. The patient knows to call the clinic with any problems, questions or concerns. No barriers to learning was detected. I spent 15 minutes counseling the patient face to face. The total time spent in the appointment was 20 minutes and more than 50% was on counseling and review of test results     [name], MD 10/27/2019   I, Joslyn Devon, am acting as scribe for [name], MD.   I have reviewed the above documentation for accuracy and completeness, and I agree with the above.

## 2019-10-27 ENCOUNTER — Inpatient Hospital Stay: Payer: Medicare HMO

## 2019-10-27 ENCOUNTER — Inpatient Hospital Stay: Payer: Medicare HMO | Admitting: Hematology

## 2019-10-27 ENCOUNTER — Inpatient Hospital Stay: Payer: Medicare HMO | Attending: Hematology

## 2019-10-27 ENCOUNTER — Telehealth: Payer: Self-pay | Admitting: Hematology

## 2019-10-27 ENCOUNTER — Encounter: Payer: Self-pay | Admitting: Hematology

## 2019-10-27 ENCOUNTER — Other Ambulatory Visit: Payer: Self-pay

## 2019-10-27 VITALS — BP 125/73 | HR 83 | Temp 98.5°F | Resp 18 | Ht 69.0 in | Wt 212.9 lb

## 2019-10-27 DIAGNOSIS — C221 Intrahepatic bile duct carcinoma: Secondary | ICD-10-CM

## 2019-10-27 DIAGNOSIS — Z5111 Encounter for antineoplastic chemotherapy: Secondary | ICD-10-CM | POA: Insufficient documentation

## 2019-10-27 DIAGNOSIS — Z5189 Encounter for other specified aftercare: Secondary | ICD-10-CM | POA: Diagnosis not present

## 2019-10-27 DIAGNOSIS — Z7189 Other specified counseling: Secondary | ICD-10-CM

## 2019-10-27 LAB — CMP (CANCER CENTER ONLY)
ALT: 24 U/L (ref 0–44)
AST: 18 U/L (ref 15–41)
Albumin: 3.5 g/dL (ref 3.5–5.0)
Alkaline Phosphatase: 59 U/L (ref 38–126)
Anion gap: 8 (ref 5–15)
BUN: 19 mg/dL (ref 8–23)
CO2: 22 mmol/L (ref 22–32)
Calcium: 9 mg/dL (ref 8.9–10.3)
Chloride: 104 mmol/L (ref 98–111)
Creatinine: 1.06 mg/dL (ref 0.61–1.24)
GFR, Est AFR Am: >60 mL/min (ref >60)
GFR, Estimated: >60 mL/min (ref >60)
Glucose, Bld: 110 mg/dL — ABNORMAL HIGH (ref 70–99)
Potassium: 4.5 mmol/L (ref 3.5–5.1)
Sodium: 134 mmol/L — ABNORMAL LOW (ref 135–145)
Total Bilirubin: 0.3 mg/dL (ref 0.3–1.2)
Total Protein: 6 g/dL — ABNORMAL LOW (ref 6.5–8.1)

## 2019-10-27 LAB — CBC WITH DIFFERENTIAL (CANCER CENTER ONLY)
Abs Immature Granulocytes: 0.29 10*3/uL — ABNORMAL HIGH (ref 0.00–0.07)
Basophils Absolute: 0.1 10*3/uL (ref 0.0–0.1)
Basophils Relative: 1 %
Eosinophils Absolute: 0 10*3/uL (ref 0.0–0.5)
Eosinophils Relative: 0 %
HCT: 37.6 % — ABNORMAL LOW (ref 39.0–52.0)
Hemoglobin: 12.7 g/dL — ABNORMAL LOW (ref 13.0–17.0)
Immature Granulocytes: 3 %
Lymphocytes Relative: 19 %
Lymphs Abs: 2.2 10*3/uL (ref 0.7–4.0)
MCH: 30.6 pg (ref 26.0–34.0)
MCHC: 33.8 g/dL (ref 30.0–36.0)
MCV: 90.6 fL (ref 80.0–100.0)
Monocytes Absolute: 0.7 10*3/uL (ref 0.1–1.0)
Monocytes Relative: 6 %
Neutro Abs: 7.9 10*3/uL — ABNORMAL HIGH (ref 1.7–7.7)
Neutrophils Relative %: 71 %
Platelet Count: 403 10*3/uL — ABNORMAL HIGH (ref 150–400)
RBC: 4.15 MIL/uL — ABNORMAL LOW (ref 4.22–5.81)
RDW: 13.7 % (ref 11.5–15.5)
WBC Count: 11.2 10*3/uL — ABNORMAL HIGH (ref 4.0–10.5)
nRBC: 0 % (ref 0.0–0.2)

## 2019-10-27 LAB — MAGNESIUM: Magnesium: 2 mg/dL (ref 1.7–2.4)

## 2019-10-27 MED ORDER — SODIUM CHLORIDE 0.9 % IV SOLN
1000.0000 mg/m2 | Freq: Once | INTRAVENOUS | Status: AC
  Administered 2019-10-27: 2166 mg via INTRAVENOUS
  Filled 2019-10-27: qty 56.97

## 2019-10-27 MED ORDER — POTASSIUM CHLORIDE 2 MEQ/ML IV SOLN
Freq: Once | INTRAVENOUS | Status: AC
  Administered 2019-10-27: 10:00:00 via INTRAVENOUS
  Filled 2019-10-27: qty 10

## 2019-10-27 MED ORDER — SODIUM CHLORIDE 0.9 % IV SOLN
Freq: Once | INTRAVENOUS | Status: AC
  Administered 2019-10-27: 12:00:00 via INTRAVENOUS
  Filled 2019-10-27: qty 5

## 2019-10-27 MED ORDER — PALONOSETRON HCL INJECTION 0.25 MG/5ML
INTRAVENOUS | Status: AC
  Filled 2019-10-27: qty 5

## 2019-10-27 MED ORDER — PALONOSETRON HCL INJECTION 0.25 MG/5ML
0.2500 mg | Freq: Once | INTRAVENOUS | Status: AC
  Administered 2019-10-27: 0.25 mg via INTRAVENOUS

## 2019-10-27 MED ORDER — SODIUM CHLORIDE 0.9 % IV SOLN
25.0000 mg/m2 | Freq: Once | INTRAVENOUS | Status: AC
  Administered 2019-10-27: 54 mg via INTRAVENOUS
  Filled 2019-10-27: qty 54

## 2019-10-27 MED ORDER — SODIUM CHLORIDE 0.9 % IV SOLN
Freq: Once | INTRAVENOUS | Status: AC
  Administered 2019-10-27: 13:00:00 via INTRAVENOUS
  Filled 2019-10-27: qty 250

## 2019-10-27 MED ORDER — SODIUM CHLORIDE 0.9 % IV SOLN
Freq: Once | INTRAVENOUS | Status: AC
  Administered 2019-10-27: 09:00:00 via INTRAVENOUS
  Filled 2019-10-27: qty 250

## 2019-10-27 NOTE — Telephone Encounter (Signed)
Per 12/7 los cancelled appt per the MD request on 12/28

## 2019-10-27 NOTE — Patient Instructions (Signed)
[facility] Discharge Instructions for Patients Receiving Chemotherapy  Today you received the following chemotherapy agents Gemzar and Cisplatin  To help prevent nausea and vomiting after your treatment, we encourage you to take your nausea medication as directed   If you develop nausea and vomiting that is not controlled by your nausea medication, call the clinic.   BELOW ARE SYMPTOMS THAT SHOULD BE REPORTED IMMEDIATELY:  *FEVER GREATER THAN 100.5 F  *CHILLS WITH OR WITHOUT FEVER  NAUSEA AND VOMITING THAT IS NOT CONTROLLED WITH YOUR NAUSEA MEDICATION  *UNUSUAL SHORTNESS OF BREATH  *UNUSUAL BRUISING OR BLEEDING  TENDERNESS IN MOUTH AND THROAT WITH OR WITHOUT PRESENCE OF ULCERS  *URINARY PROBLEMS  *BOWEL PROBLEMS  UNUSUAL RASH Items with * indicate a potential emergency and should be followed up as soon as possible.  Feel free to call the clinic should you have any questions or concerns. The clinic phone number is [phone].  Please show the CHEMO ALERT CARD at check-in to the Emergency Department and triage nurse.   

## 2019-10-28 ENCOUNTER — Other Ambulatory Visit: Payer: Self-pay

## 2019-10-28 ENCOUNTER — Inpatient Hospital Stay: Payer: Medicare HMO

## 2019-10-28 VITALS — BP 141/75 | HR 89 | Temp 98.5°F | Resp 16

## 2019-10-28 DIAGNOSIS — Z5111 Encounter for antineoplastic chemotherapy: Secondary | ICD-10-CM | POA: Diagnosis not present

## 2019-10-28 DIAGNOSIS — Z5189 Encounter for other specified aftercare: Secondary | ICD-10-CM | POA: Diagnosis not present

## 2019-10-28 DIAGNOSIS — Z7189 Other specified counseling: Secondary | ICD-10-CM

## 2019-10-28 DIAGNOSIS — C221 Intrahepatic bile duct carcinoma: Secondary | ICD-10-CM

## 2019-10-28 MED ORDER — PEGFILGRASTIM-JMDB 6 MG/0.6ML ~~LOC~~ SOSY
6.0000 mg | PREFILLED_SYRINGE | Freq: Once | SUBCUTANEOUS | Status: AC
  Administered 2019-10-28: 6 mg via SUBCUTANEOUS

## 2019-10-28 MED ORDER — PEGFILGRASTIM-JMDB 6 MG/0.6ML ~~LOC~~ SOSY
PREFILLED_SYRINGE | SUBCUTANEOUS | Status: AC
  Filled 2019-10-28: qty 0.6

## 2019-10-28 NOTE — Patient Instructions (Signed)

## 2019-10-31 ENCOUNTER — Ambulatory Visit: Payer: Medicare HMO

## 2019-10-31 ENCOUNTER — Other Ambulatory Visit: Payer: Medicare HMO

## 2019-10-31 ENCOUNTER — Ambulatory Visit: Payer: Medicare HMO | Admitting: Hematology

## 2019-11-11 ENCOUNTER — Other Ambulatory Visit: Payer: Self-pay | Admitting: Gastroenterology

## 2019-11-11 ENCOUNTER — Ambulatory Visit: Payer: Medicare HMO | Admitting: Nurse Practitioner

## 2019-11-11 ENCOUNTER — Other Ambulatory Visit: Payer: Medicare HMO

## 2019-11-11 ENCOUNTER — Ambulatory Visit: Payer: Medicare HMO

## 2019-11-17 ENCOUNTER — Inpatient Hospital Stay: Payer: Medicare HMO

## 2019-11-17 ENCOUNTER — Other Ambulatory Visit: Payer: Self-pay

## 2019-11-17 ENCOUNTER — Ambulatory Visit: Payer: Medicare HMO | Admitting: Medical

## 2019-11-17 VITALS — BP 145/83 | HR 64 | Temp 98.3°F | Resp 16

## 2019-11-17 DIAGNOSIS — C221 Intrahepatic bile duct carcinoma: Secondary | ICD-10-CM

## 2019-11-17 DIAGNOSIS — Z5111 Encounter for antineoplastic chemotherapy: Secondary | ICD-10-CM | POA: Diagnosis not present

## 2019-11-17 DIAGNOSIS — Z7189 Other specified counseling: Secondary | ICD-10-CM

## 2019-11-17 DIAGNOSIS — Z5189 Encounter for other specified aftercare: Secondary | ICD-10-CM | POA: Diagnosis not present

## 2019-11-17 LAB — CBC WITH DIFFERENTIAL (CANCER CENTER ONLY)
Abs Immature Granulocytes: 0.42 10*3/uL — ABNORMAL HIGH (ref 0.00–0.07)
Basophils Absolute: 0.2 10*3/uL — ABNORMAL HIGH (ref 0.0–0.1)
Basophils Relative: 1 %
Eosinophils Absolute: 0.1 10*3/uL (ref 0.0–0.5)
Eosinophils Relative: 1 %
HCT: 38.6 % — ABNORMAL LOW (ref 39.0–52.0)
Hemoglobin: 12.6 g/dL — ABNORMAL LOW (ref 13.0–17.0)
Immature Granulocytes: 3 %
Lymphocytes Relative: 16 %
Lymphs Abs: 2.1 10*3/uL (ref 0.7–4.0)
MCH: 30.8 pg (ref 26.0–34.0)
MCHC: 32.6 g/dL (ref 30.0–36.0)
MCV: 94.4 fL (ref 80.0–100.0)
Monocytes Absolute: 1.2 10*3/uL — ABNORMAL HIGH (ref 0.1–1.0)
Monocytes Relative: 9 %
Neutro Abs: 9.1 10*3/uL — ABNORMAL HIGH (ref 1.7–7.7)
Neutrophils Relative %: 70 %
Platelet Count: 424 10*3/uL — ABNORMAL HIGH (ref 150–400)
RBC: 4.09 MIL/uL — ABNORMAL LOW (ref 4.22–5.81)
RDW: 17.9 % — ABNORMAL HIGH (ref 11.5–15.5)
WBC Count: 13.1 10*3/uL — ABNORMAL HIGH (ref 4.0–10.5)
nRBC: 0.2 % (ref 0.0–0.2)

## 2019-11-17 LAB — CMP (CANCER CENTER ONLY)
ALT: 19 U/L (ref 0–44)
AST: 20 U/L (ref 15–41)
Albumin: 3.6 g/dL (ref 3.5–5.0)
Alkaline Phosphatase: 60 U/L (ref 38–126)
Anion gap: 11 (ref 5–15)
BUN: 14 mg/dL (ref 8–23)
CO2: 22 mmol/L (ref 22–32)
Calcium: 9.3 mg/dL (ref 8.9–10.3)
Chloride: 105 mmol/L (ref 98–111)
Creatinine: 1.01 mg/dL (ref 0.61–1.24)
GFR, Est AFR Am: >60 mL/min (ref >60)
GFR, Estimated: >60 mL/min (ref >60)
Glucose, Bld: 129 mg/dL — ABNORMAL HIGH (ref 70–99)
Potassium: 4.3 mmol/L (ref 3.5–5.1)
Sodium: 138 mmol/L (ref 135–145)
Total Bilirubin: 0.3 mg/dL (ref 0.3–1.2)
Total Protein: 6.4 g/dL — ABNORMAL LOW (ref 6.5–8.1)

## 2019-11-17 LAB — MAGNESIUM: Magnesium: 1.9 mg/dL (ref 1.7–2.4)

## 2019-11-17 MED ORDER — SODIUM CHLORIDE 0.9 % IV SOLN
1000.0000 mg/m2 | Freq: Once | INTRAVENOUS | Status: AC
  Administered 2019-11-17: 2166 mg via INTRAVENOUS
  Filled 2019-11-17: qty 56.97

## 2019-11-17 MED ORDER — SODIUM CHLORIDE 0.9 % IV SOLN
Freq: Once | INTRAVENOUS | Status: AC
  Filled 2019-11-17: qty 5

## 2019-11-17 MED ORDER — PALONOSETRON HCL INJECTION 0.25 MG/5ML
INTRAVENOUS | Status: AC
  Filled 2019-11-17: qty 5

## 2019-11-17 MED ORDER — SODIUM CHLORIDE 0.9 % IV SOLN
Freq: Once | INTRAVENOUS | Status: AC
  Filled 2019-11-17: qty 250

## 2019-11-17 MED ORDER — POTASSIUM CHLORIDE 2 MEQ/ML IV SOLN
Freq: Once | INTRAVENOUS | Status: AC
  Filled 2019-11-17: qty 10

## 2019-11-17 MED ORDER — PALONOSETRON HCL INJECTION 0.25 MG/5ML
0.2500 mg | Freq: Once | INTRAVENOUS | Status: AC
  Administered 2019-11-17: 0.25 mg via INTRAVENOUS

## 2019-11-17 MED ORDER — SODIUM CHLORIDE 0.9 % IV SOLN
25.0000 mg/m2 | Freq: Once | INTRAVENOUS | Status: AC
  Administered 2019-11-17: 54 mg via INTRAVENOUS
  Filled 2019-11-17: qty 54

## 2019-11-17 NOTE — Patient Instructions (Signed)
[facility] Discharge Instructions for Patients Receiving Chemotherapy  Today you received the following chemotherapy agents: gemcitabine and cisplatin.  To help prevent nausea and vomiting after your treatment, we encourage you to take your nausea medication as directed.   If you develop nausea and vomiting that is not controlled by your nausea medication, call the clinic.   BELOW ARE SYMPTOMS THAT SHOULD BE REPORTED IMMEDIATELY:  *FEVER GREATER THAN 100.5 F  *CHILLS WITH OR WITHOUT FEVER  NAUSEA AND VOMITING THAT IS NOT CONTROLLED WITH YOUR NAUSEA MEDICATION  *UNUSUAL SHORTNESS OF BREATH  *UNUSUAL BRUISING OR BLEEDING  TENDERNESS IN MOUTH AND THROAT WITH OR WITHOUT PRESENCE OF ULCERS  *URINARY PROBLEMS  *BOWEL PROBLEMS  UNUSUAL RASH Items with * indicate a potential emergency and should be followed up as soon as possible.  Feel free to call the clinic should you have any questions or concerns. The clinic phone number is [phone].  Please show the CHEMO ALERT CARD at check-in to the Emergency Department and triage nurse.   

## 2019-11-18 ENCOUNTER — Other Ambulatory Visit: Payer: Medicare HMO

## 2019-11-20 ENCOUNTER — Telehealth: Payer: Self-pay

## 2019-11-20 ENCOUNTER — Telehealth: Payer: Self-pay | Admitting: Nurse Practitioner

## 2019-11-20 NOTE — Telephone Encounter (Signed)
Left voice message for patient's wife [name] on her cell phone regarding his scheduled appointments on Tuesday, 11/25/2019 with times to be here.

## 2019-11-20 NOTE — Telephone Encounter (Signed)
Called pt per 12/28 sch message - unable to reach pt and unable to leave message. Message RN and Regan Rakers to let them know

## 2019-11-24 ENCOUNTER — Telehealth: Payer: Self-pay | Admitting: Hematology

## 2019-11-24 NOTE — Telephone Encounter (Signed)
Returned phone call regarding rescheduling an appointment, phone line was busy or invalid. Could not leave a voicemail.

## 2019-11-24 NOTE — Progress Notes (Signed)
[place]   [phone] [phone]   Clinic Follow up Note   Patient Care Team: [name], PA as PCP - General (Family Medicine) [name], MD as Consulting Physician (General Surgery) [name], [name], MD as Consulting Physician (Gastroenterology) [name], MD as Consulting Physician (Hematology) 11/25/2019  CHIEF COMPLAINT: F/u liver cancer   SUMMARY OF ONCOLOGIC HISTORY: Oncology History Overview Note  Cancer Staging Intrahepatic cholangiocarcinoma ([place]) Staging form: Intrahepatic Bile Duct, AJCC 8th Edition - Clinical stage from 08/19/2019: Stage II (cT2, cN0, cM0) - Signed by [name], MD on 08/19/2019    Intrahepatic cholangiocarcinoma ([place])  06/28/2019 Imaging   CT AP W Contrast 06/28/19  IMPRESSION: 1. Heterogeneous hypodensity posteriorly in the right hepatic lobe and potentially extending into the caudate lobe suspicious for a mass. There is felt to be truncation of branches of the portal vein in this vicinity and some narrowing of the hepatic vein, as well as triangular-shaped regions of abnormal hypoenhancement posteriorly in the right hepatic lobe likely representing downstream vascular effects. Cannot exclude malignancy such as cholangiocarcinoma or hepatocellular carcinoma, and follow up hepatic protocol MRI with and without contrast is recommended to further characterize. 2. 4 mm right middle lobe pulmonary nodule is likely benign but may merit surveillance. 3. Cholelithiasis. 4.  Aortic Atherosclerosis (ICD10-I70.0). 5. Prostatomegaly. 6. Mild impingement at L3-4 and L4-5.   07/31/2019 Imaging   MRI Liver 07/31/19 IMPRESSION: 1. 7.3 cm in long axis mass in the right hepatic lobe spanning into the caudate lobe, high suspicion for malignancy such as hepatocellular carcinoma or cholangiocarcinoma. Suspected effacement or occlusion of the right hepatic vein and posterior branches of the right portal vein. Two  smaller tumor nodules along the posterior periphery of the dominant mass. Tissue diagnosis is recommended. 2. No findings of pathologic adenopathy or distant metastatic spread. 3. 9 mm gallstone in the gallbladder. There is mild gallbladder wall thickening which may be from nondistention, correlate clinically in assessing for cholecystitis. 4.  Aortic Atherosclerosis (ICD10-I70.0). 5. Mild diffuse hepatic steatosis.   08/11/2019 Initial Biopsy   DIAGNOSIS: 08/11/19  A. LIVER, RIGHT, BIOPSY:  - Adenocarcinoma.   08/18/2019 Imaging   CT Chest 08/18/19  IMPRESSION: 1. Multiple pulmonary nodules largest at approximately 7 mm in the right lower lobe, nonspecific but concerning given findings in the liver. 2. No signs of definitive metastatic disease, also with mildly enlarged upper abdominal lymph nodes as discussed.   Aortic Atherosclerosis (ICD10-I70.0).   08/19/2019 Initial Diagnosis   Intrahepatic cholangiocarcinoma ([place])   08/19/2019 Cancer Staging   Staging form: Intrahepatic Bile Duct, AJCC 8th Edition - Clinical stage from 08/19/2019: Stage II (cT2, cN0, cM0) - Signed by [name], MD on 08/19/2019   09/29/2019 -  Chemotherapy   Cisplatin and Gemcitabine 2 weeks on/1 week off starting 09/29/19    10/09/2019 Imaging   CT AP IMPRESSION: 1. The dominant right hepatic lobe mass is minimally reduced in size compared to prior exams, currently measuring 6.2 by 5.3 cm, previously 6.2 by 5.6 cm. However, there is a new small hypodense lesion centrally in the right hepatic lobe which is suspicious for a new small focus of tumor. Accordingly this is an overall mixed appearance. 2. Continued hypoenhancement in the liver downstream of the tumor likely attributable to narrowing or occlusion of the right hepatic vein by the tumor. By virtue of its location the tumor wraps around the intrahepatic portion of the IVC. 3. 4 mm right middle lobe pulmonary nodule,  stable compared to earliest available  comparison of 07/18/2019. Surveillance of the patient's pulmonary nodules is recommended. 4. Other imaging findings of potential clinical significance: Coronary atherosclerosis. Cholelithiasis. Prominent stool throughout the colon favors constipation. Moderate prostatomegaly with heterogeneous enhancement of the prostate gland. Lumbar spondylosis and degenerative disc disease causing mild bilateral foraminal impingement at L3-4 and L4-5.   Aortic Atherosclerosis (ICD10-I70.0).     CURRENT THERAPY: Cisplatin and Gemcitabine 2 weeks on/1 week off starting 09/29/19  INTERVAL HISTORY: [name] returns for f/u and treatment as scheduled. He completed cycle 3 day 1 on 11/17/19. He feels well today. Appetite and energy level are good. Gets tired after few hours of work. Mild intermittent nausea without vomiting is managed with compazine or phenergan, takes it about once per week. Denies constipation or diarrhea. He notes occasional burning in RUQ, not pain, but "let's me know it's there." Does not require pain meds. This is stable on treatment. Denies other neuropathy, fever, chills, cough, chest pain, dyspnea, new leg swelling.     MEDICAL HISTORY:  Past Medical History:  Diagnosis Date  . Arthritis   . Diabetes ([place])   . GERD (gastroesophageal reflux disease)   . Hyperlipidemia   . Hypertension   . Intrahepatic cholangiocarcinoma ([place])     SURGICAL HISTORY: Past Surgical History:  Procedure Laterality Date  . APPENDECTOMY  1980  . COLONOSCOPY      I have reviewed the social history and family history with the patient and they are unchanged from previous note.  ALLERGIES:  has No Known Allergies.  MEDICATIONS:  Current Outpatient Medications  Medication Sig Dispense Refill  . allopurinol (ZYLOPRIM) 100 MG tablet Take 100 mg by mouth daily.    Marland Kitchen amLODipine (NORVASC) 2.5 MG tablet Take 2.5 mg by mouth daily.    Marland Kitchen aspirin EC 81 MG tablet Take 81 mg by mouth daily.    . famotidine  (PEPCID) 40 MG tablet Take 0.5 tablets (20 mg total) by mouth 2 (two) times daily. 30 tablet 5  . fenofibrate (TRICOR) 145 MG tablet Take 145 mg by mouth daily.    . finasteride (PROSCAR) 5 MG tablet Take 5 mg by mouth daily.    Marland Kitchen lisinopril (ZESTRIL) 40 MG tablet Take 40 mg by mouth daily.    . metFORMIN (GLUCOPHAGE) 500 MG tablet Take 500 mg by mouth 2 (two) times daily with a meal.     . pantoprazole (PROTONIX) 40 MG tablet TAKE 1 TABLET BY MOUTH TWICE A DAY 60 tablet 2  . pravastatin (PRAVACHOL) 40 MG tablet Take 40 mg by mouth daily.    . prochlorperazine (COMPAZINE) 10 MG tablet Take 1 tablet (10 mg total) by mouth every 6 (six) hours as needed (Nausea or vomiting). 30 tablet 1  . promethazine (PHENERGAN) 12.5 MG tablet TAKE 1 TABLET BY MOUTH EVERY 6 HOURS AS NEEDED FOR NAUSEA OR VOMITING. 20 tablet 1  . sucralfate (CARAFATE) 1 g tablet TAKE 1 TABLET (1 G TOTAL) BY MOUTH 4 (FOUR) TIMES DAILY - WITH MEALS AND AT BEDTIME. 90 tablet 1  . tamsulosin (FLOMAX) 0.4 MG CAPS capsule Take 0.4 mg by mouth daily.    . Vitamin D, Ergocalciferol, (DRISDOL) 1.25 MG (50000 UT) CAPS capsule Take 50,000 Units by mouth every 7 (seven) days.    . ondansetron (ZOFRAN) 4 MG tablet Take 4 mg by mouth every 8 (eight) hours as needed for nausea or vomiting.    . ondansetron (ZOFRAN) 8 MG tablet Take 1 tablet (8  mg total) by mouth 2 (two) times daily as needed. Start on the third day after chemotherapy. 30 tablet 1   No current facility-administered medications for this visit.   Facility-Administered Medications Ordered in Other Visits  Medication Dose Route Frequency Provider Last Rate Last Admin  . 0.9 %  sodium chloride infusion   Intravenous Once [name], MD      . dextrose 5 % and 0.45% NaCl 1,000 mL with potassium chloride 20 mEq, magnesium sulfate 12 mEq infusion   Intravenous Once [name], MD        PHYSICAL EXAMINATION: ECOG PERFORMANCE STATUS: 1 - Symptomatic but completely ambulatory  Vitals:    11/25/19 0809  BP: 127/74  Pulse: 69  Resp: 18  Temp: 98.6 F (37 C)  SpO2: 100%   Filed Weights   11/25/19 0809  Weight: 224 lb 6.4 oz (101.8 kg)    GENERAL:alert, no distress and comfortable SKIN: no rash  EYES: sclera clear NECK: without mass LUNGS: clear with normal breathing effort HEART: regular rate & rhythm ABDOMEN:abdomen soft, non-tender and normal bowel sounds. No hepatomegaly or tenderness  Musculoskeletal: L>R lower extremity at baseline  NEURO: alert & oriented x 3 with fluent speech, no focal motor/sensory deficits No PAC  LABORATORY DATA:  I have reviewed the data as listed CBC Latest Ref Rng & Units 11/25/2019 11/17/2019 10/27/2019  WBC 4.0 - 10.5 K/uL 8.1 13.1(H) 11.2(H)  Hemoglobin 13.0 - 17.0 g/dL 11.5(L) 12.6(L) 12.7(L)  Hematocrit 39.0 - 52.0 % 34.2(L) 38.6(L) 37.6(L)  Platelets 150 - 400 K/uL 166 424(H) 403(H)     CMP Latest Ref Rng & Units 11/25/2019 11/17/2019 10/27/2019  Glucose 70 - 99 mg/dL 123(H) 129(H) 110(H)  BUN 8 - 23 mg/dL 18 14 19   Creatinine 0.61 - 1.24 mg/dL 1.10 1.01 1.06  Sodium 135 - 145 mmol/L 135 138 134(L)  Potassium 3.5 - 5.1 mmol/L 4.3 4.3 4.5  Chloride 98 - 111 mmol/L 105 105 104  CO2 22 - 32 mmol/L 22 22 22   Calcium 8.9 - 10.3 mg/dL 9.4 9.3 9.0  Total Protein 6.5 - 8.1 g/dL 6.4(L) 6.4(L) 6.0(L)  Total Bilirubin 0.3 - 1.2 mg/dL 0.5 0.3 0.3  Alkaline Phos 38 - 126 U/L 46 60 59  AST 15 - 41 U/L 23 20 18   ALT 0 - 44 U/L 24 19 24       RADIOGRAPHIC STUDIES: I have personally reviewed the radiological images as listed and agreed with the findings in the report. No results found.   ASSESSMENT & PLAN: Brandon Schoening Sizemoreis a 74 y.o.malewith   1.Intrahepatic cholangiocarcinoma, cT2N0Mx,unresectable,with indeterminate lung nodules -Diagnosed in 07/2019.CT scans and MRI liver showa large7.3cm mass in the right hepatic lobe whichabutsportal vein. Both our local surgeon Dr. Barry Dienes andDr Carlis [facility] at [facility] concluded that cancer  is not resectable, and therefore not likely curable, due to the invasion to portal vein. -He began palliative systemic treatment standardfirst line chemo with IV Cisplatin and Gemcitabine 2 weeks on/1 week off.Started 09/29/19 -Mr. Schools appears stable today. He completed cycle 3 day 1 chemo. He continues to tolerate treatment well overall with mild nausea and fatigue. Continue supportive meds. -Labs reviewed, CBC and CMP are stable, Mg normal.  -He will proceed with cycle 3 day 8 gemcitabine and cisplatin today, same dose. Continue day 9 Fulphila.  -Return in 2 weeks for f/u and cycle 4 day 1. Plan to restage after cycle 4 in 1 month.   2. Nausea, Low food intakeand weight loss -  With pain from GERD and nausea he initially lost 30 pounds. -followed by dietician -gaining weight on treatment  3. DM, HTN, HLD, Gout  -On Metformin, amlodipine, lisinopril., allopurinol  -Continue to f/u with his PCP -BG 123 today, BP 127/74. stable  4. Nicotine Use -He never smoked but has been chewing Tobacco for the past 60 years.He no longer drinks alcohol and tells me he has quit smoking (11/25/19).  5.GERDand gastritis, history ofesophageal candidiasis -Hehad repeated EGDwith Dr. Havery Moros in 07/2019. 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 Mayrepeat EGDin future -GERD resolved on PPI and carafate  PLAN: Labs reviewed Proceed with cycle 3 day 8 gemcitabine and cisplatin Day 9 Fulphila F/u in 2 weeks with cycle 4 day 1 Restage after cycle 4 (in 1 month)  No problem-specific Assessment & Plan notes found for this encounter.   Orders Placed This Encounter  Procedures  . CT Abdomen Pelvis W Contrast    Standing Status:   Future    Standing Expiration Date:   11/24/2020    Order Specific Question:   ** REASON FOR EXAM (FREE TEXT)    Answer:   restaging bile duct cancer on chemo     Order Specific Question:   If indicated for the ordered procedure, I authorize the administration of contrast media per Radiology protocol    Answer:   Yes    Order Specific Question:   Preferred imaging location?    Answer:   [facility]    Order Specific Question:   Is Oral Contrast requested for this exam?    Answer:   Yes, Per Radiology protocol    Order Specific Question:   Radiology Contrast Protocol - do NOT remove file path    Answer:   \\[email or web address]  . CT Chest W Contrast    Standing Status:   Future    Standing Expiration Date:   11/24/2020    Order Specific Question:   ** REASON FOR EXAM (FREE TEXT)    Answer:   restaging bile duct cancer on chemo    Order Specific Question:   If indicated for the ordered procedure, I authorize the administration of contrast media per Radiology protocol    Answer:   Yes    Order Specific Question:   Preferred imaging location?    Answer:   [facility]    Order Specific Question:   Radiology Contrast Protocol - do NOT remove file path    Answer:   \\[email or web address]   All questions were answered. The patient knows to call the clinic with any problems, questions or concerns. No barriers to learning was detected.     Alla Feeling, NP 11/25/19

## 2019-11-25 ENCOUNTER — Inpatient Hospital Stay: Payer: Medicare HMO

## 2019-11-25 ENCOUNTER — Inpatient Hospital Stay: Payer: Medicare HMO | Admitting: Nurse Practitioner

## 2019-11-25 ENCOUNTER — Encounter: Payer: Self-pay | Admitting: Nurse Practitioner

## 2019-11-25 ENCOUNTER — Inpatient Hospital Stay: Payer: Medicare HMO | Attending: Hematology

## 2019-11-25 ENCOUNTER — Other Ambulatory Visit: Payer: Self-pay

## 2019-11-25 VITALS — BP 127/74 | HR 69 | Temp 98.6°F | Resp 18 | Ht 69.0 in | Wt 224.4 lb

## 2019-11-25 DIAGNOSIS — C221 Intrahepatic bile duct carcinoma: Secondary | ICD-10-CM

## 2019-11-25 DIAGNOSIS — Z5111 Encounter for antineoplastic chemotherapy: Secondary | ICD-10-CM | POA: Diagnosis not present

## 2019-11-25 DIAGNOSIS — Z7189 Other specified counseling: Secondary | ICD-10-CM

## 2019-11-25 DIAGNOSIS — Z5189 Encounter for other specified aftercare: Secondary | ICD-10-CM | POA: Insufficient documentation

## 2019-11-25 LAB — CMP (CANCER CENTER ONLY)
ALT: 24 U/L (ref 0–44)
AST: 23 U/L (ref 15–41)
Albumin: 3.9 g/dL (ref 3.5–5.0)
Alkaline Phosphatase: 46 U/L (ref 38–126)
Anion gap: 8 (ref 5–15)
BUN: 18 mg/dL (ref 8–23)
CO2: 22 mmol/L (ref 22–32)
Calcium: 9.4 mg/dL (ref 8.9–10.3)
Chloride: 105 mmol/L (ref 98–111)
Creatinine: 1.1 mg/dL (ref 0.61–1.24)
GFR, Est AFR Am: >60 mL/min (ref >60)
GFR, Estimated: >60 mL/min (ref >60)
Glucose, Bld: 123 mg/dL — ABNORMAL HIGH (ref 70–99)
Potassium: 4.3 mmol/L (ref 3.5–5.1)
Sodium: 135 mmol/L (ref 135–145)
Total Bilirubin: 0.5 mg/dL (ref 0.3–1.2)
Total Protein: 6.4 g/dL — ABNORMAL LOW (ref 6.5–8.1)

## 2019-11-25 LAB — CBC WITH DIFFERENTIAL (CANCER CENTER ONLY)
Abs Immature Granulocytes: 0.12 10*3/uL — ABNORMAL HIGH (ref 0.00–0.07)
Basophils Absolute: 0.1 10*3/uL (ref 0.0–0.1)
Basophils Relative: 2 %
Eosinophils Absolute: 0.1 10*3/uL (ref 0.0–0.5)
Eosinophils Relative: 1 %
HCT: 34.2 % — ABNORMAL LOW (ref 39.0–52.0)
Hemoglobin: 11.5 g/dL — ABNORMAL LOW (ref 13.0–17.0)
Immature Granulocytes: 2 %
Lymphocytes Relative: 27 %
Lymphs Abs: 2.1 10*3/uL (ref 0.7–4.0)
MCH: 31.2 pg (ref 26.0–34.0)
MCHC: 33.6 g/dL (ref 30.0–36.0)
MCV: 92.7 fL (ref 80.0–100.0)
Monocytes Absolute: 0.8 10*3/uL (ref 0.1–1.0)
Monocytes Relative: 10 %
Neutro Abs: 4.8 10*3/uL (ref 1.7–7.7)
Neutrophils Relative %: 58 %
Platelet Count: 166 10*3/uL (ref 150–400)
RBC: 3.69 MIL/uL — ABNORMAL LOW (ref 4.22–5.81)
RDW: 17 % — ABNORMAL HIGH (ref 11.5–15.5)
WBC Count: 8.1 10*3/uL (ref 4.0–10.5)
nRBC: 0 % (ref 0.0–0.2)

## 2019-11-25 LAB — MAGNESIUM: Magnesium: 1.9 mg/dL (ref 1.7–2.4)

## 2019-11-25 MED ORDER — PALONOSETRON HCL INJECTION 0.25 MG/5ML
0.2500 mg | Freq: Once | INTRAVENOUS | Status: AC
  Administered 2019-11-25: 11:00:00 0.25 mg via INTRAVENOUS

## 2019-11-25 MED ORDER — SODIUM CHLORIDE 0.9 % IV SOLN
Freq: Once | INTRAVENOUS | Status: AC
  Filled 2019-11-25: qty 5

## 2019-11-25 MED ORDER — POTASSIUM CHLORIDE 2 MEQ/ML IV SOLN
Freq: Once | INTRAVENOUS | Status: AC
  Filled 2019-11-25: qty 10

## 2019-11-25 MED ORDER — SODIUM CHLORIDE 0.9 % IV SOLN
1000.0000 mg/m2 | Freq: Once | INTRAVENOUS | Status: AC
  Administered 2019-11-25: 2166 mg via INTRAVENOUS
  Filled 2019-11-25: qty 56.97

## 2019-11-25 MED ORDER — SODIUM CHLORIDE 0.9 % IV SOLN
25.0000 mg/m2 | Freq: Once | INTRAVENOUS | Status: AC
  Administered 2019-11-25: 13:00:00 54 mg via INTRAVENOUS
  Filled 2019-11-25: qty 54

## 2019-11-25 MED ORDER — PALONOSETRON HCL INJECTION 0.25 MG/5ML
INTRAVENOUS | Status: AC
  Filled 2019-11-25: qty 5

## 2019-11-25 MED ORDER — SODIUM CHLORIDE 0.9 % IV SOLN
Freq: Once | INTRAVENOUS | Status: AC
  Filled 2019-11-25: qty 250

## 2019-11-25 NOTE — Patient Instructions (Signed)
[facility] Discharge Instructions for Patients Receiving Chemotherapy  Today you received the following chemotherapy agents Gemzar; Cisplatin  To help prevent nausea and vomiting after your treatment, we encourage you to take your nausea medication as directed If you develop nausea and vomiting that is not controlled by your nausea medication, call the clinic.   BELOW ARE SYMPTOMS THAT SHOULD BE REPORTED IMMEDIATELY:  *FEVER GREATER THAN 100.5 F  *CHILLS WITH OR WITHOUT FEVER  NAUSEA AND VOMITING THAT IS NOT CONTROLLED WITH YOUR NAUSEA MEDICATION  *UNUSUAL SHORTNESS OF BREATH  *UNUSUAL BRUISING OR BLEEDING  TENDERNESS IN MOUTH AND THROAT WITH OR WITHOUT PRESENCE OF ULCERS  *URINARY PROBLEMS  *BOWEL PROBLEMS  UNUSUAL RASH Items with * indicate a potential emergency and should be followed up as soon as possible.  Feel free to call the clinic should you have any questions or concerns. The clinic phone number is [phone].  Please show the CHEMO ALERT CARD at check-in to the Emergency Department and triage nurse.   

## 2019-12-01 ENCOUNTER — Ambulatory Visit: Payer: Medicare HMO

## 2019-12-01 ENCOUNTER — Other Ambulatory Visit: Payer: Medicare HMO

## 2019-12-01 ENCOUNTER — Ambulatory Visit: Payer: Medicare HMO | Admitting: Hematology

## 2019-12-03 NOTE — Progress Notes (Signed)
[place]   Telephone:([phone] Fax:(336) [phone]   Clinic Follow up Note   Patient Care Team: [name], PA as PCP - General (Family Medicine) [name], MD as Consulting Physician (General Surgery) [name], [name], MD as Consulting Physician (Gastroenterology) [name], MD as Consulting Physician (Hematology)  Date of Service:  12/08/2019  CHIEF COMPLAINT:  F/u of liver cancer  SUMMARY OF ONCOLOGIC HISTORY: Oncology History Overview Note  Cancer Staging Intrahepatic cholangiocarcinoma ([place]) Staging form: Intrahepatic Bile Duct, AJCC 8th Edition - Clinical stage from 08/19/2019: Stage II (cT2, cN0, cM0) - Signed by [name], MD on 08/19/2019    Intrahepatic cholangiocarcinoma ([place])  06/28/2019 Imaging   CT AP W Contrast 06/28/19  IMPRESSION: 1. Heterogeneous hypodensity posteriorly in the right hepatic lobe and potentially extending into the caudate lobe suspicious for a mass. There is felt to be truncation of branches of the portal vein in this vicinity and some narrowing of the hepatic vein, as well as triangular-shaped regions of abnormal hypoenhancement posteriorly in the right hepatic lobe likely representing downstream vascular effects. Cannot exclude malignancy such as cholangiocarcinoma or hepatocellular carcinoma, and follow up hepatic protocol MRI with and without contrast is recommended to further characterize. 2. 4 mm right middle lobe pulmonary nodule is likely benign but may merit surveillance. 3. Cholelithiasis. 4.  Aortic Atherosclerosis (ICD10-I70.0). 5. Prostatomegaly. 6. Mild impingement at L3-4 and L4-5.   07/31/2019 Imaging   MRI Liver 07/31/19 IMPRESSION: 1. 7.3 cm in long axis mass in the right hepatic lobe spanning into the caudate lobe, high suspicion for malignancy such as hepatocellular carcinoma or cholangiocarcinoma. Suspected effacement or occlusion of the right hepatic vein and posterior branches of  the right portal vein. Two smaller tumor nodules along the posterior periphery of the dominant mass. Tissue diagnosis is recommended. 2. No findings of pathologic adenopathy or distant metastatic spread. 3. 9 mm gallstone in the gallbladder. There is mild gallbladder wall thickening which may be from nondistention, correlate clinically in assessing for cholecystitis. 4.  Aortic Atherosclerosis (ICD10-I70.0). 5. Mild diffuse hepatic steatosis.   08/11/2019 Initial Biopsy   DIAGNOSIS: 08/11/19  A. LIVER, RIGHT, BIOPSY:  - Adenocarcinoma.   08/18/2019 Imaging   CT Chest 08/18/19  IMPRESSION: 1. Multiple pulmonary nodules largest at approximately 7 mm in the right lower lobe, nonspecific but concerning given findings in the liver. 2. No signs of definitive metastatic disease, also with mildly enlarged upper abdominal lymph nodes as discussed.   Aortic Atherosclerosis (ICD10-I70.0).   08/19/2019 Initial Diagnosis   Intrahepatic cholangiocarcinoma ([place])   08/19/2019 Cancer Staging   Staging form: Intrahepatic Bile Duct, AJCC 8th Edition - Clinical stage from 08/19/2019: Stage II (cT2, cN0, cM0) - Signed by [name], MD on 08/19/2019   09/29/2019 -  Chemotherapy   Cisplatin and Gemcitabine 2 weeks on/1 week off starting 09/29/19    10/09/2019 Imaging   CT AP IMPRESSION: 1. The dominant right hepatic lobe mass is minimally reduced in size compared to prior exams, currently measuring 6.2 by 5.3 cm, previously 6.2 by 5.6 cm. However, there is a new small hypodense lesion centrally in the right hepatic lobe which is suspicious for a new small focus of tumor. Accordingly this is an overall mixed appearance. 2. Continued hypoenhancement in the liver downstream of the tumor likely attributable to narrowing or occlusion of the right hepatic vein by the tumor. By virtue of its location the tumor wraps around the intrahepatic portion of the IVC. 3. 4  mm right middle lobe pulmonary nodule, stable  compared to earliest available comparison of 07/18/2019. Surveillance of the patient's pulmonary nodules is recommended. 4. Other imaging findings of potential clinical significance: Coronary atherosclerosis. Cholelithiasis. Prominent stool throughout the colon favors constipation. Moderate prostatomegaly with heterogeneous enhancement of the prostate gland. Lumbar spondylosis and degenerative disc disease causing mild bilateral foraminal impingement at L3-4 and L4-5.   Aortic Atherosclerosis (ICD10-I70.0).      CURRENT THERAPY:  Cisplatin and Gemcitabine 2 weeks on/1 week off starting 09/29/19  INTERVAL HISTORY:  Brandon Sherman is here for a follow up and treatment. He presents to the clinic alone. He notes he is doing well. He denies significant issues with chemo currently and his last cycle went well. He notes mild constipation and takes OTC Colace. He feels his energy level is adequate to be active and work. He notes he feels overall at baseline. He notes recent back pain from activity at work.     REVIEW OF SYSTEMS:   Constitutional: Denies fevers, chills or abnormal weight loss Eyes: Denies blurriness of vision Ears, nose, mouth, throat, and face: Denies mucositis or sore throat Respiratory: Denies cough, dyspnea or wheezes Cardiovascular: Denies palpitation, chest discomfort or lower extremity swelling Gastrointestinal:  Denies nausea, heartburn (+) mild constipation Skin: Denies abnormal skin rashes MSK: (+) Mild back pain  Lymphatics: Denies new lymphadenopathy or easy bruising Neurological:Denies numbness, tingling or new weaknesses Behavioral/Psych: Mood is stable, no new changes  All other systems were reviewed with the patient and are negative.  MEDICAL HISTORY:  Past Medical History:  Diagnosis Date  . Arthritis   . Diabetes ([place])   . GERD (gastroesophageal reflux disease)   . Hyperlipidemia   . Hypertension   . Intrahepatic cholangiocarcinoma ([place])      SURGICAL HISTORY: Past Surgical History:  Procedure Laterality Date  . APPENDECTOMY  1980  . COLONOSCOPY      I have reviewed the social history and family history with the patient and they are unchanged from previous note.  ALLERGIES:  has No Known Allergies.  MEDICATIONS:  Current Outpatient Medications  Medication Sig Dispense Refill  . allopurinol (ZYLOPRIM) 100 MG tablet Take 100 mg by mouth daily.    Marland Kitchen amLODipine (NORVASC) 2.5 MG tablet Take 2.5 mg by mouth daily.    Marland Kitchen aspirin EC 81 MG tablet Take 81 mg by mouth daily.    . famotidine (PEPCID) 40 MG tablet Take 0.5 tablets (20 mg total) by mouth 2 (two) times daily. 30 tablet 5  . fenofibrate (TRICOR) 145 MG tablet Take 145 mg by mouth daily.    . finasteride (PROSCAR) 5 MG tablet Take 5 mg by mouth daily.    Marland Kitchen lisinopril (ZESTRIL) 40 MG tablet Take 40 mg by mouth daily.    . metFORMIN (GLUCOPHAGE) 500 MG tablet Take 500 mg by mouth 2 (two) times daily with a meal.     . ondansetron (ZOFRAN) 4 MG tablet Take 4 mg by mouth every 8 (eight) hours as needed for nausea or vomiting.    . ondansetron (ZOFRAN) 8 MG tablet Take 1 tablet (8 mg total) by mouth 2 (two) times daily as needed. Start on the third day after chemotherapy. 30 tablet 1  . pantoprazole (PROTONIX) 40 MG tablet TAKE 1 TABLET BY MOUTH TWICE A DAY 60 tablet 2  . pravastatin (PRAVACHOL) 40 MG tablet Take 40 mg by mouth daily.    . prochlorperazine (COMPAZINE) 10 MG tablet Take 1 tablet (10 mg  total) by mouth every 6 (six) hours as needed (Nausea or vomiting). 30 tablet 1  . promethazine (PHENERGAN) 12.5 MG tablet TAKE 1 TABLET BY MOUTH EVERY 6 HOURS AS NEEDED FOR NAUSEA OR VOMITING. 20 tablet 1  . sucralfate (CARAFATE) 1 g tablet TAKE 1 TABLET (1 G TOTAL) BY MOUTH 4 (FOUR) TIMES DAILY - WITH MEALS AND AT BEDTIME. 90 tablet 1  . tamsulosin (FLOMAX) 0.4 MG CAPS capsule Take 0.4 mg by mouth daily.    . Vitamin D, Ergocalciferol, (DRISDOL) 1.25 MG (50000 UT) CAPS  capsule Take 50,000 Units by mouth every 7 (seven) days.     No current facility-administered medications for this visit.   Facility-Administered Medications Ordered in Other Visits  Medication Dose Route Frequency Provider Last Rate Last Admin  . dextrose 5 % and 0.45% NaCl 1,000 mL with potassium chloride 20 mEq, magnesium sulfate 12 mEq infusion   Intravenous Once [name], MD        PHYSICAL EXAMINATION: ECOG PERFORMANCE STATUS: 1 - Symptomatic but completely ambulatory  Vitals:   12/08/19 0850  BP: 140/80  Pulse: 62  Resp: 18  Temp: 98.7 F (37.1 C)  SpO2: 100%   Filed Weights   12/08/19 0850  Weight: 229 lb 1.6 oz (103.9 kg)    Due to COVID19 we will limit examination to appearance. Patient had no complaints.  GENERAL:alert, no distress and comfortable SKIN: skin color normal, no rashes or significant lesions EYES: normal, Conjunctiva are pink and non-injected, sclera clear  NEURO: alert & oriented x 3 with fluent speech   LABORATORY DATA:  I have reviewed the data as listed CBC Latest Ref Rng & Units 12/08/2019 11/25/2019 11/17/2019  WBC 4.0 - 10.5 K/uL 4.6 8.1 13.1(H)  Hemoglobin 13.0 - 17.0 g/dL 11.1(L) 11.5(L) 12.6(L)  Hematocrit 39.0 - 52.0 % 34.1(L) 34.2(L) 38.6(L)  Platelets 150 - 400 K/uL 218 166 424(H)     CMP Latest Ref Rng & Units 12/08/2019 11/25/2019 11/17/2019  Glucose 70 - 99 mg/dL 113(H) 123(H) 129(H)  BUN 8 - 23 mg/dL 10 18 14   Creatinine 0.61 - 1.24 mg/dL 1.09 1.10 1.01  Sodium 135 - 145 mmol/L 140 135 138  Potassium 3.5 - 5.1 mmol/L 4.2 4.3 4.3  Chloride 98 - 111 mmol/L 109 105 105  CO2 22 - 32 mmol/L 22 22 22   Calcium 8.9 - 10.3 mg/dL 9.0 9.4 9.3  Total Protein 6.5 - 8.1 g/dL 6.4(L) 6.4(L) 6.4(L)  Total Bilirubin 0.3 - 1.2 mg/dL 0.5 0.5 0.3  Alkaline Phos 38 - 126 U/L 45 46 60  AST 15 - 41 U/L 19 23 20   ALT 0 - 44 U/L 19 24 19       RADIOGRAPHIC STUDIES: I have personally reviewed the radiological images as listed and agreed with the  findings in the report. No results found.   ASSESSMENT & PLAN:  Brandon Sherman is a 74 y.o. male with    1.Intrahepatic cholangiocarcinoma, cT2N0Mx,unresectable,with indeterminate lung nodules -He was recently diagnosed in 07/2019.CT scans and MRI liver showa large7.3cm mass in the right hepatic lobe whichabutsportal vein.  -He was seen byour local surgeon Dr. Barry Dienes andDr Carlis Abbott at [facility] concluded that cancer is not resectable due to the invasion to portal vein. -I discussed given his cancer is non-resectable his cancer is likely not curable but still treatable. I started him on standardfirst line chemo with IV Cisplatin and Gemcitabine 2 weeks on/1 week off beginning 09/29/19.  -I requested FO on his liver biopsy  today.  -S/p C3 he is tolerating chemo and injection with no major side effects. He has mild constipation overall. Labs reviewed, with mild anemia. Overall adequate to proceed with C4D1 Gem/Cis today.  -F/u in 3 weeks with restaging CT scan.    2. Nausea, Low food intakeand weight loss -With pain from GERD and nausea he initially lost 30 pounds. -His nausea and GERD is based on what he eats. Controlled onPhenergan, Sucralfate and Pepcid. -He waspreviouslyseen by Dietician.  -No current nausea or vomiting on chemo and using less antiemetics. For his mild constipation he can continue OTC stool softeners. His weight is stable as he is eating adequately. Stable.   3. DM, HTN, HLD, Gout  -On Metformin, amlodipine, lisinopril., allopurinol  -Continue to f/u with his PCP  -Will monitor with treatment.We discussed that chemotherapy may impact his sugar level and blood pressure, I may adjust his medication if needed.  4. Nicotine Use -He never smoked but has been chewing Tobacco for the past 60 years.He no longer drinks alcohol. -He now chews 1-2 times a day.Iagainencouraged him toreduce and quit completely.  5.GERDand gastritis, history  ofesophageal candidiasis -Hehad repeated EGDwith Dr. Havery Moros in 07/2019. 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  -Will monitor andmayrepeat EGDin future   PLAN: -Labs reviewed and adequate to proceed with C4D1 Cis/Gem today, Fulphila injection on day 9 -chemo Gem/Cis in 1, 3,4 weeks  -CT CAP W Contrast scheduled on 2/3 -F/u in 3 weeks, his wife will come in with him on next visit    No problem-specific Assessment & Plan notes found for this encounter.   No orders of the defined types were placed in this encounter.  All questions were answered. The patient knows to call the clinic with any problems, questions or concerns. No barriers to learning was detected. The total time spent in the appointment was 30 minutes.     [name], MD 12/08/2019   I, Joslyn Devon, am acting as scribe for [name], MD.   I have reviewed the above documentation for accuracy and completeness, and I agree with the above.

## 2019-12-07 ENCOUNTER — Other Ambulatory Visit: Payer: Self-pay | Admitting: Gastroenterology

## 2019-12-08 ENCOUNTER — Other Ambulatory Visit: Payer: Self-pay

## 2019-12-08 ENCOUNTER — Encounter: Payer: Self-pay | Admitting: Hematology

## 2019-12-08 ENCOUNTER — Inpatient Hospital Stay: Payer: Medicare HMO | Admitting: Hematology

## 2019-12-08 ENCOUNTER — Inpatient Hospital Stay: Payer: Medicare HMO

## 2019-12-08 VITALS — BP 140/80 | HR 62 | Temp 98.7°F | Resp 18 | Ht 69.0 in | Wt 229.1 lb

## 2019-12-08 DIAGNOSIS — C221 Intrahepatic bile duct carcinoma: Secondary | ICD-10-CM

## 2019-12-08 DIAGNOSIS — Z7189 Other specified counseling: Secondary | ICD-10-CM

## 2019-12-08 DIAGNOSIS — Z5189 Encounter for other specified aftercare: Secondary | ICD-10-CM | POA: Diagnosis not present

## 2019-12-08 DIAGNOSIS — Z5111 Encounter for antineoplastic chemotherapy: Secondary | ICD-10-CM | POA: Diagnosis not present

## 2019-12-08 LAB — CMP (CANCER CENTER ONLY)
ALT: 19 U/L (ref 0–44)
AST: 19 U/L (ref 15–41)
Albumin: 3.9 g/dL (ref 3.5–5.0)
Alkaline Phosphatase: 45 U/L (ref 38–126)
Anion gap: 9 (ref 5–15)
BUN: 10 mg/dL (ref 8–23)
CO2: 22 mmol/L (ref 22–32)
Calcium: 9 mg/dL (ref 8.9–10.3)
Chloride: 109 mmol/L (ref 98–111)
Creatinine: 1.09 mg/dL (ref 0.61–1.24)
GFR, Est AFR Am: >60 mL/min (ref >60)
GFR, Estimated: >60 mL/min (ref >60)
Glucose, Bld: 113 mg/dL — ABNORMAL HIGH (ref 70–99)
Potassium: 4.2 mmol/L (ref 3.5–5.1)
Sodium: 140 mmol/L (ref 135–145)
Total Bilirubin: 0.5 mg/dL (ref 0.3–1.2)
Total Protein: 6.4 g/dL — ABNORMAL LOW (ref 6.5–8.1)

## 2019-12-08 LAB — CBC WITH DIFFERENTIAL (CANCER CENTER ONLY)
Abs Immature Granulocytes: 0.01 10*3/uL (ref 0.00–0.07)
Basophils Absolute: 0 10*3/uL (ref 0.0–0.1)
Basophils Relative: 1 %
Eosinophils Absolute: 0.1 10*3/uL (ref 0.0–0.5)
Eosinophils Relative: 2 %
HCT: 34.1 % — ABNORMAL LOW (ref 39.0–52.0)
Hemoglobin: 11.1 g/dL — ABNORMAL LOW (ref 13.0–17.0)
Immature Granulocytes: 0 %
Lymphocytes Relative: 28 %
Lymphs Abs: 1.3 10*3/uL (ref 0.7–4.0)
MCH: 31.5 pg (ref 26.0–34.0)
MCHC: 32.6 g/dL (ref 30.0–36.0)
MCV: 96.9 fL (ref 80.0–100.0)
Monocytes Absolute: 0.6 10*3/uL (ref 0.1–1.0)
Monocytes Relative: 12 %
Neutro Abs: 2.6 10*3/uL (ref 1.7–7.7)
Neutrophils Relative %: 57 %
Platelet Count: 218 10*3/uL (ref 150–400)
RBC: 3.52 MIL/uL — ABNORMAL LOW (ref 4.22–5.81)
RDW: 18.3 % — ABNORMAL HIGH (ref 11.5–15.5)
WBC Count: 4.6 10*3/uL (ref 4.0–10.5)
nRBC: 0 % (ref 0.0–0.2)

## 2019-12-08 LAB — MAGNESIUM: Magnesium: 1.8 mg/dL (ref 1.7–2.4)

## 2019-12-08 MED ORDER — SODIUM CHLORIDE 0.9 % IV SOLN
25.0000 mg/m2 | Freq: Once | INTRAVENOUS | Status: AC
  Administered 2019-12-08: 54 mg via INTRAVENOUS
  Filled 2019-12-08: qty 54

## 2019-12-08 MED ORDER — PALONOSETRON HCL INJECTION 0.25 MG/5ML
INTRAVENOUS | Status: AC
  Filled 2019-12-08: qty 5

## 2019-12-08 MED ORDER — SODIUM CHLORIDE 0.9 % IV SOLN
Freq: Once | INTRAVENOUS | Status: DC
  Filled 2019-12-08: qty 250

## 2019-12-08 MED ORDER — SODIUM CHLORIDE 0.9 % IV SOLN
1000.0000 mg/m2 | Freq: Once | INTRAVENOUS | Status: AC
  Administered 2019-12-08: 13:00:00 2166 mg via INTRAVENOUS
  Filled 2019-12-08: qty 56.97

## 2019-12-08 MED ORDER — PALONOSETRON HCL INJECTION 0.25 MG/5ML
0.2500 mg | Freq: Once | INTRAVENOUS | Status: AC
  Administered 2019-12-08: 12:00:00 0.25 mg via INTRAVENOUS

## 2019-12-08 MED ORDER — SODIUM CHLORIDE 0.9 % IV SOLN
Freq: Once | INTRAVENOUS | Status: AC
  Filled 2019-12-08: qty 250

## 2019-12-08 MED ORDER — POTASSIUM CHLORIDE 2 MEQ/ML IV SOLN
Freq: Once | INTRAVENOUS | Status: AC
  Filled 2019-12-08: qty 10

## 2019-12-08 MED ORDER — SODIUM CHLORIDE 0.9 % IV SOLN
Freq: Once | INTRAVENOUS | Status: AC
  Filled 2019-12-08: qty 5

## 2019-12-08 NOTE — Patient Instructions (Signed)
[facility] Discharge Instructions for Patients Receiving Chemotherapy  Today you received the following chemotherapy agents Gemzar and Cisplatin  To help prevent nausea and vomiting after your treatment, we encourage you to take your nausea medication as directed   If you develop nausea and vomiting that is not controlled by your nausea medication, call the clinic.   BELOW ARE SYMPTOMS THAT SHOULD BE REPORTED IMMEDIATELY:  *FEVER GREATER THAN 100.5 F  *CHILLS WITH OR WITHOUT FEVER  NAUSEA AND VOMITING THAT IS NOT CONTROLLED WITH YOUR NAUSEA MEDICATION  *UNUSUAL SHORTNESS OF BREATH  *UNUSUAL BRUISING OR BLEEDING  TENDERNESS IN MOUTH AND THROAT WITH OR WITHOUT PRESENCE OF ULCERS  *URINARY PROBLEMS  *BOWEL PROBLEMS  UNUSUAL RASH Items with * indicate a potential emergency and should be followed up as soon as possible.  Feel free to call the clinic should you have any questions or concerns. The clinic phone number is [phone].  Please show the CHEMO ALERT CARD at check-in to the Emergency Department and triage nurse.   

## 2019-12-09 ENCOUNTER — Telehealth: Payer: Self-pay | Admitting: Hematology

## 2019-12-09 DIAGNOSIS — E669 Obesity, unspecified: Secondary | ICD-10-CM | POA: Diagnosis not present

## 2019-12-09 DIAGNOSIS — Z1331 Encounter for screening for depression: Secondary | ICD-10-CM | POA: Diagnosis not present

## 2019-12-09 DIAGNOSIS — Z9181 History of falling: Secondary | ICD-10-CM | POA: Diagnosis not present

## 2019-12-09 DIAGNOSIS — Z136 Encounter for screening for cardiovascular disorders: Secondary | ICD-10-CM | POA: Diagnosis not present

## 2019-12-09 DIAGNOSIS — Z125 Encounter for screening for malignant neoplasm of prostate: Secondary | ICD-10-CM | POA: Diagnosis not present

## 2019-12-09 DIAGNOSIS — E785 Hyperlipidemia, unspecified: Secondary | ICD-10-CM | POA: Diagnosis not present

## 2019-12-09 DIAGNOSIS — Z0000 Encounter for general adult medical examination without abnormal findings: Secondary | ICD-10-CM | POA: Diagnosis not present

## 2019-12-09 DIAGNOSIS — Z6832 Body mass index (BMI) 32.0-32.9, adult: Secondary | ICD-10-CM | POA: Diagnosis not present

## 2019-12-09 NOTE — Telephone Encounter (Signed)
Scheduled appt per 1/18 los.  Left a vm of the appt date and time. 

## 2019-12-15 ENCOUNTER — Inpatient Hospital Stay: Payer: Medicare HMO

## 2019-12-15 ENCOUNTER — Other Ambulatory Visit: Payer: Self-pay

## 2019-12-15 VITALS — BP 141/74 | HR 62 | Temp 98.5°F | Resp 20 | Ht 69.0 in | Wt 225.5 lb

## 2019-12-15 DIAGNOSIS — C221 Intrahepatic bile duct carcinoma: Secondary | ICD-10-CM

## 2019-12-15 DIAGNOSIS — Z7189 Other specified counseling: Secondary | ICD-10-CM

## 2019-12-15 DIAGNOSIS — Z5189 Encounter for other specified aftercare: Secondary | ICD-10-CM | POA: Diagnosis not present

## 2019-12-15 DIAGNOSIS — Z5111 Encounter for antineoplastic chemotherapy: Secondary | ICD-10-CM | POA: Diagnosis not present

## 2019-12-15 LAB — CMP (CANCER CENTER ONLY)
ALT: 19 U/L (ref 0–44)
AST: 16 U/L (ref 15–41)
Albumin: 3.9 g/dL (ref 3.5–5.0)
Alkaline Phosphatase: 41 U/L (ref 38–126)
Anion gap: 10 (ref 5–15)
BUN: 18 mg/dL (ref 8–23)
CO2: 21 mmol/L — ABNORMAL LOW (ref 22–32)
Calcium: 9.5 mg/dL (ref 8.9–10.3)
Chloride: 105 mmol/L (ref 98–111)
Creatinine: 1.14 mg/dL (ref 0.61–1.24)
GFR, Est AFR Am: >60 mL/min (ref >60)
GFR, Estimated: >60 mL/min (ref >60)
Glucose, Bld: 126 mg/dL — ABNORMAL HIGH (ref 70–99)
Potassium: 4.5 mmol/L (ref 3.5–5.1)
Sodium: 136 mmol/L (ref 135–145)
Total Bilirubin: 0.3 mg/dL (ref 0.3–1.2)
Total Protein: 6.5 g/dL (ref 6.5–8.1)

## 2019-12-15 LAB — CBC WITH DIFFERENTIAL (CANCER CENTER ONLY)
Abs Immature Granulocytes: 0.05 10*3/uL (ref 0.00–0.07)
Basophils Absolute: 0.1 10*3/uL (ref 0.0–0.1)
Basophils Relative: 2 %
Eosinophils Absolute: 0.1 10*3/uL (ref 0.0–0.5)
Eosinophils Relative: 2 %
HCT: 31.6 % — ABNORMAL LOW (ref 39.0–52.0)
Hemoglobin: 10.7 g/dL — ABNORMAL LOW (ref 13.0–17.0)
Immature Granulocytes: 2 %
Lymphocytes Relative: 53 %
Lymphs Abs: 1.7 10*3/uL (ref 0.7–4.0)
MCH: 32.1 pg (ref 26.0–34.0)
MCHC: 33.9 g/dL (ref 30.0–36.0)
MCV: 94.9 fL (ref 80.0–100.0)
Monocytes Absolute: 0.3 10*3/uL (ref 0.1–1.0)
Monocytes Relative: 9 %
Neutro Abs: 1 10*3/uL — ABNORMAL LOW (ref 1.7–7.7)
Neutrophils Relative %: 32 %
Platelet Count: 268 10*3/uL (ref 150–400)
RBC: 3.33 MIL/uL — ABNORMAL LOW (ref 4.22–5.81)
RDW: 16.5 % — ABNORMAL HIGH (ref 11.5–15.5)
WBC Count: 3.2 10*3/uL — ABNORMAL LOW (ref 4.0–10.5)
nRBC: 0 % (ref 0.0–0.2)

## 2019-12-15 LAB — MAGNESIUM: Magnesium: 1.8 mg/dL (ref 1.7–2.4)

## 2019-12-15 MED ORDER — SODIUM CHLORIDE 0.9 % IV SOLN
1000.0000 mg/m2 | Freq: Once | INTRAVENOUS | Status: AC
  Administered 2019-12-15: 13:00:00 2166 mg via INTRAVENOUS
  Filled 2019-12-15: qty 56.97

## 2019-12-15 MED ORDER — PALONOSETRON HCL INJECTION 0.25 MG/5ML
0.2500 mg | Freq: Once | INTRAVENOUS | Status: AC
  Administered 2019-12-15: 12:00:00 0.25 mg via INTRAVENOUS

## 2019-12-15 MED ORDER — SODIUM CHLORIDE 0.9 % IV SOLN
Freq: Once | INTRAVENOUS | Status: AC
  Filled 2019-12-15: qty 5

## 2019-12-15 MED ORDER — POTASSIUM CHLORIDE 2 MEQ/ML IV SOLN
Freq: Once | INTRAVENOUS | Status: AC
  Filled 2019-12-15: qty 10

## 2019-12-15 MED ORDER — SODIUM CHLORIDE 0.9 % IV SOLN
25.0000 mg/m2 | Freq: Once | INTRAVENOUS | Status: AC
  Administered 2019-12-15: 14:00:00 54 mg via INTRAVENOUS
  Filled 2019-12-15: qty 54

## 2019-12-15 MED ORDER — SODIUM CHLORIDE 0.9 % IV SOLN
Freq: Once | INTRAVENOUS | Status: AC
  Filled 2019-12-15: qty 250

## 2019-12-15 MED ORDER — PALONOSETRON HCL INJECTION 0.25 MG/5ML
INTRAVENOUS | Status: AC
  Filled 2019-12-15: qty 5

## 2019-12-15 NOTE — Progress Notes (Signed)
Per Dr. Burr Medico, pt is OK to treat today with [place] of 1

## 2019-12-15 NOTE — Patient Instructions (Signed)
[facility] Discharge Instructions for Patients Receiving Chemotherapy  Today you received the following chemotherapy agents: Gemzar, Cisplatin  To help prevent nausea and vomiting after your treatment, we encourage you to take your nausea medication as directed by your MD.   If you develop nausea and vomiting that is not controlled by your nausea medication, call the clinic.   BELOW ARE SYMPTOMS THAT SHOULD BE REPORTED IMMEDIATELY:  *FEVER GREATER THAN 100.5 F  *CHILLS WITH OR WITHOUT FEVER  NAUSEA AND VOMITING THAT IS NOT CONTROLLED WITH YOUR NAUSEA MEDICATION  *UNUSUAL SHORTNESS OF BREATH  *UNUSUAL BRUISING OR BLEEDING  TENDERNESS IN MOUTH AND THROAT WITH OR WITHOUT PRESENCE OF ULCERS  *URINARY PROBLEMS  *BOWEL PROBLEMS  UNUSUAL RASH Items with * indicate a potential emergency and should be followed up as soon as possible.  Feel free to call the clinic should you have any questions or concerns. The clinic phone number is [phone].  Please show the [facility] at check-in to the Emergency Department and triage nurse.  Coronavirus (COVID-19) Are you at risk?  Are you at risk for the Coronavirus (COVID-19)?  To be considered HIGH RISK for Coronavirus (COVID-19), you have to meet the following criteria:  . Traveled to [place], [place], [place], [place] or [place]; or in the [place] to [place], [place], [place], or [place]; and have fever, cough, and shortness of breath within the last 2 weeks of travel OR . Been in close contact with a person diagnosed with COVID-19 within the last 2 weeks and have fever, cough, and shortness of breath . IF YOU DO NOT MEET THESE CRITERIA, YOU ARE CONSIDERED LOW RISK FOR COVID-19.  What to do if you are HIGH RISK for COVID-19?  Marland Kitchen If you are having a medical emergency, call 911. . Seek medical care right away. Before you go to a doctor's office, urgent care or emergency department, call ahead and tell  them about your recent travel, contact with someone diagnosed with COVID-19, and your symptoms. You should receive instructions from your physician's office regarding next steps of care.  . When you arrive at healthcare provider, tell the healthcare staff immediately you have returned from visiting [place], [place], [place], [place] or [place]; or traveled in the [place] to [place], [place], [place], or [place]; in the last two weeks or you have been in close contact with a person diagnosed with COVID-19 in the last 2 weeks.   . Tell the health care staff about your symptoms: fever, cough and shortness of breath. . After you have been seen by a medical provider, you will be either: o Tested for (COVID-19) and discharged home on quarantine except to seek medical care if symptoms worsen, and asked to  - Stay home and avoid contact with others until you get your results (4-5 days)  - Avoid travel on public transportation if possible (such as bus, train, or airplane) or o Sent to the Emergency Department by EMS for evaluation, COVID-19 testing, and possible admission depending on your condition and test results.  What to do if you are LOW RISK for COVID-19?  Reduce your risk of any infection by using the same precautions used for avoiding the common cold or flu:  Marland Kitchen Wash your hands often with soap and warm water for at least 20 seconds.  If soap and water are not readily available, use an alcohol-based hand sanitizer with at least 60% alcohol.  Marland Kitchen  If coughing or sneezing, cover your mouth and nose by coughing or sneezing into the elbow areas of your shirt or coat, into a tissue or into your sleeve (not your hands). . Avoid shaking hands with others and consider head nods or verbal greetings only. . Avoid touching your eyes, nose, or mouth with unwashed hands.  . Avoid close contact with people who are sick. . Avoid places or events with large numbers of people in one location, like concerts or  sporting events. . Carefully consider travel plans you have or are making. . If you are planning any travel outside or inside the [place], visit the CDC's Travelers' Health webpage for the latest health notices. . If you have some symptoms but not all symptoms, continue to monitor at home and seek medical attention if your symptoms worsen. . If you are having a medical emergency, call 911.   [facility] / e-Visit: [email or web address]         [facility]: [facility]: [phone]                   [facility]: [phone]

## 2019-12-17 ENCOUNTER — Inpatient Hospital Stay: Payer: Medicare HMO

## 2019-12-17 ENCOUNTER — Other Ambulatory Visit: Payer: Self-pay

## 2019-12-17 VITALS — BP 144/77 | HR 66 | Temp 98.1°F | Resp 18

## 2019-12-17 DIAGNOSIS — Z7189 Other specified counseling: Secondary | ICD-10-CM

## 2019-12-17 DIAGNOSIS — Z5189 Encounter for other specified aftercare: Secondary | ICD-10-CM | POA: Diagnosis not present

## 2019-12-17 DIAGNOSIS — Z5111 Encounter for antineoplastic chemotherapy: Secondary | ICD-10-CM | POA: Diagnosis not present

## 2019-12-17 DIAGNOSIS — C221 Intrahepatic bile duct carcinoma: Secondary | ICD-10-CM

## 2019-12-17 MED ORDER — PEGFILGRASTIM-JMDB 6 MG/0.6ML ~~LOC~~ SOSY
PREFILLED_SYRINGE | SUBCUTANEOUS | Status: AC
  Filled 2019-12-17: qty 0.6

## 2019-12-17 MED ORDER — PEGFILGRASTIM-JMDB 6 MG/0.6ML ~~LOC~~ SOSY
6.0000 mg | PREFILLED_SYRINGE | Freq: Once | SUBCUTANEOUS | Status: AC
  Administered 2019-12-17: 6 mg via SUBCUTANEOUS

## 2019-12-18 DIAGNOSIS — C221 Intrahepatic bile duct carcinoma: Secondary | ICD-10-CM | POA: Diagnosis not present

## 2019-12-22 ENCOUNTER — Other Ambulatory Visit: Payer: Medicare HMO

## 2019-12-22 ENCOUNTER — Ambulatory Visit: Payer: Medicare HMO

## 2019-12-22 ENCOUNTER — Ambulatory Visit: Payer: Medicare HMO | Admitting: Hematology

## 2019-12-24 ENCOUNTER — Encounter (HOSPITAL_COMMUNITY): Payer: Self-pay

## 2019-12-24 ENCOUNTER — Ambulatory Visit (HOSPITAL_COMMUNITY)
Admission: RE | Admit: 2019-12-24 | Discharge: 2019-12-24 | Disposition: A | Discharge status: Home / Self Care | Payer: Medicare HMO | Source: Ambulatory Visit | Attending: Nurse Practitioner | Admitting: Nurse Practitioner

## 2019-12-24 ENCOUNTER — Other Ambulatory Visit: Payer: Self-pay

## 2019-12-24 DIAGNOSIS — C221 Intrahepatic bile duct carcinoma: Secondary | ICD-10-CM | POA: Insufficient documentation

## 2019-12-24 IMAGING — CT CT CHEST W/ CM
3 of 9 series · 15 of 46 positions shown, 17 images · IV contrast (OMNIPAQUE)
Comparison: CT abdomen pelvis [DATE] and CT chest [DATE].

CLINICAL DATA: Bile duct cancer on chemotherapy.

EXAM:
CT CHEST, ABDOMEN, AND PELVIS WITH CONTRAST
TECHNIQUE: Multidetector CT imaging of the chest, abdomen and pelvis was
performed following the standard protocol during bolus
administration of intravenous contrast.
CONTRAST:  100mL OMNIPAQUE IOHEXOL 300 MG/ML  SOLN

[Series 3: coronal arterial · coronal · arterial · 0.59mm/px · 3 of 120 slices shown]
[im 30/120  soft-tissue]
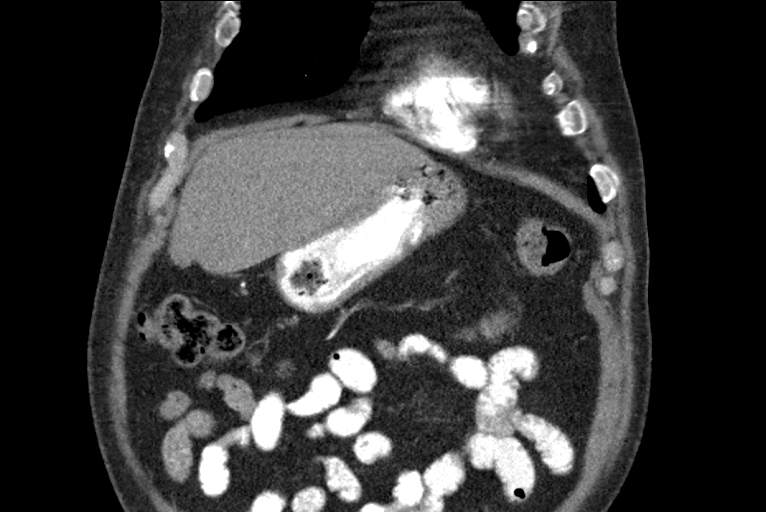
[im 60/120  soft-tissue]
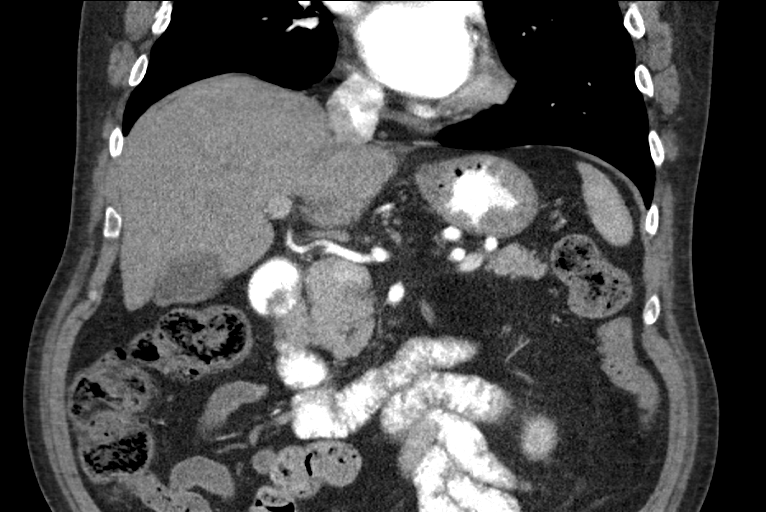
[im 90/120  soft-tissue]
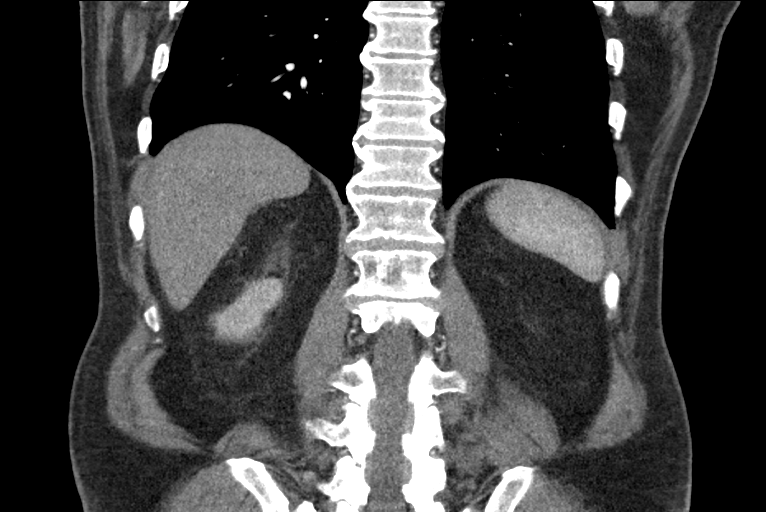

[Series 7: axial venous · axial · portal-venous · 0.84mm/px · z∈[-640,-100]mm · 10 of 222 slices shown, 12 images]
[im 21/222  soft-tissue]
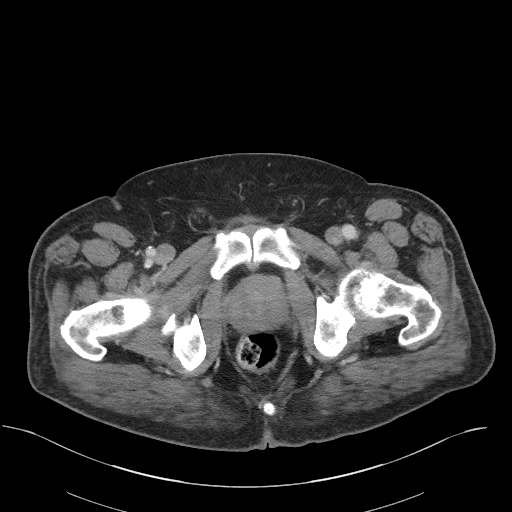
[im 21/222  bone]
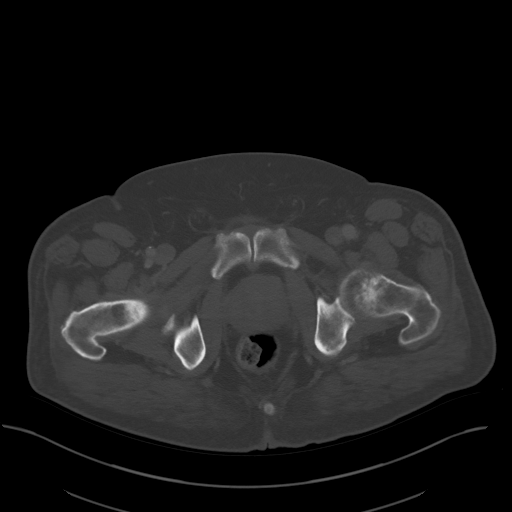
[im 41/222  soft-tissue]
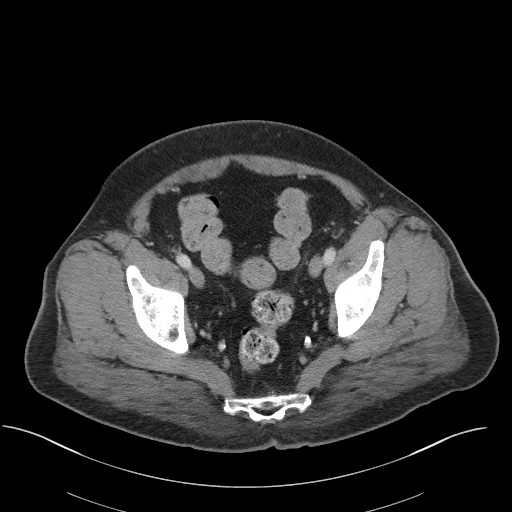
[im 61/222  soft-tissue]
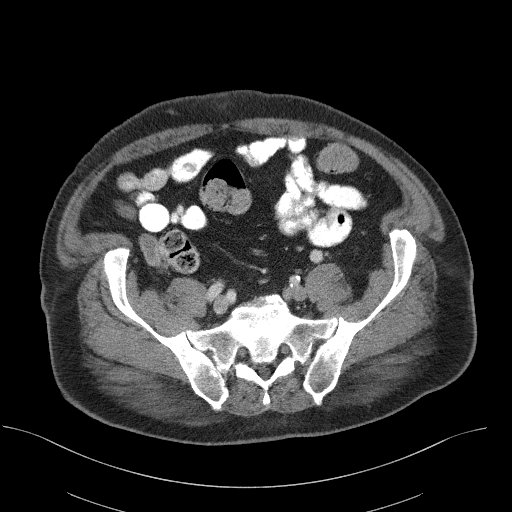
[im 81/222  soft-tissue]
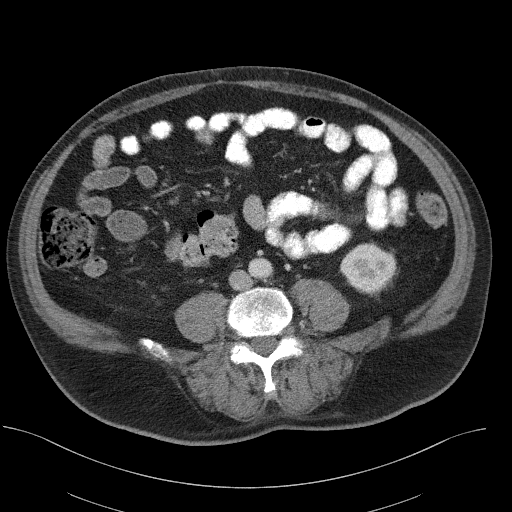
[im 101/222  soft-tissue]
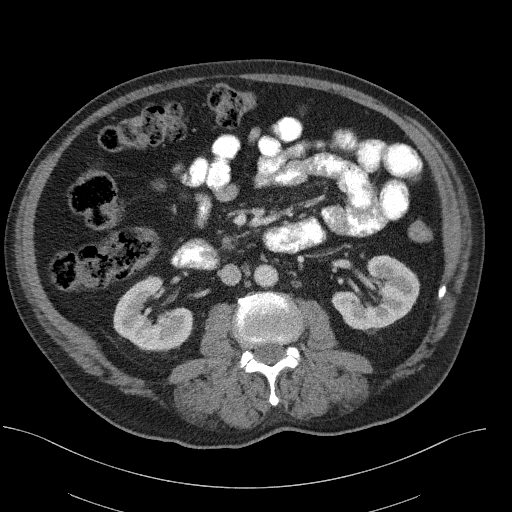
[im 121/222  soft-tissue]
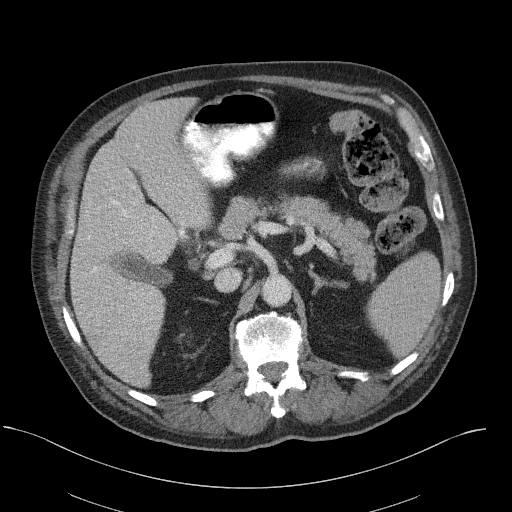
[im 141/222  soft-tissue]
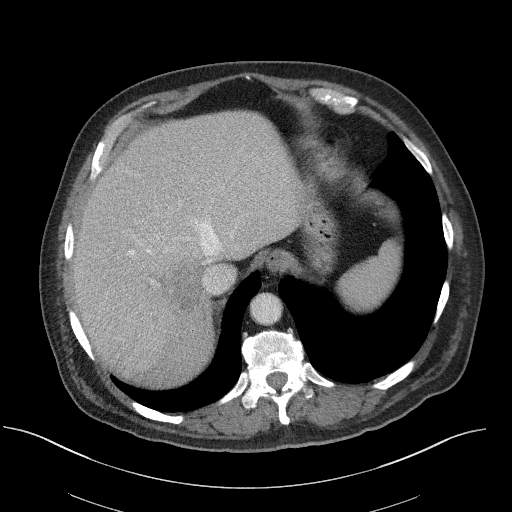
[im 161/222  soft-tissue]
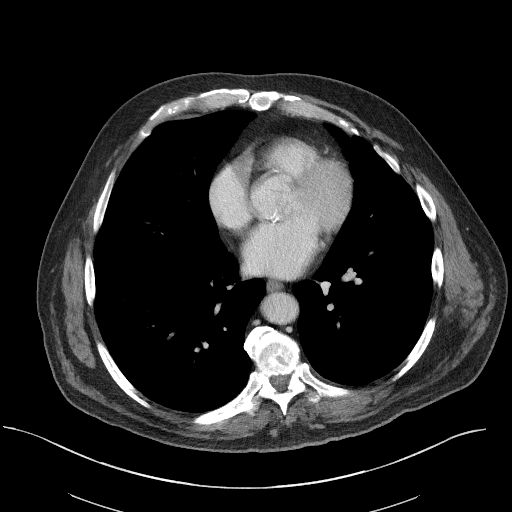
[im 181/222  soft-tissue]
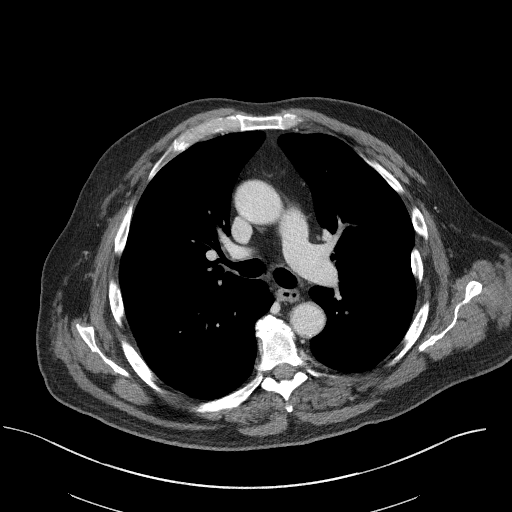
[im 181/222  bone]
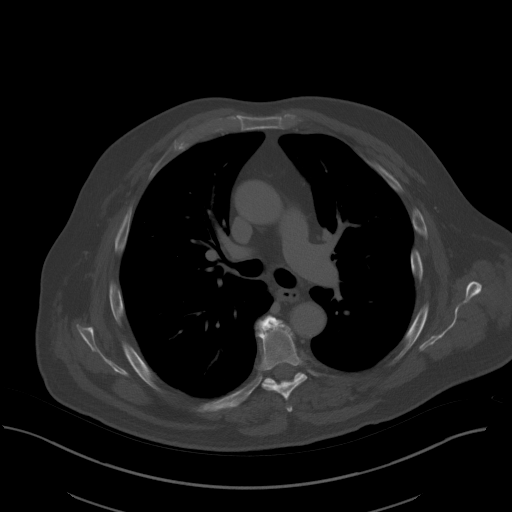
[im 201/222  soft-tissue]
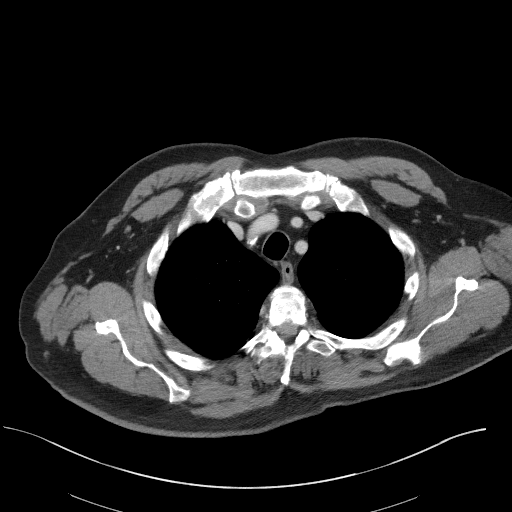

[Series 11: lung portal · axial · portal-venous · 0.84mm/px · z∈[-283,-243]mm · 2 of 144 slices shown]
[im 21/144  bone]
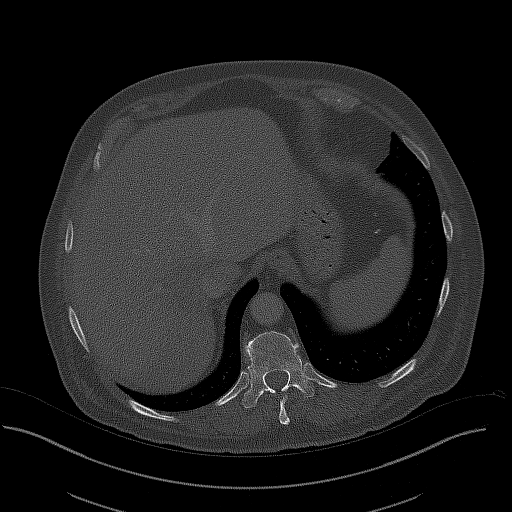
[im 41/144  bone]
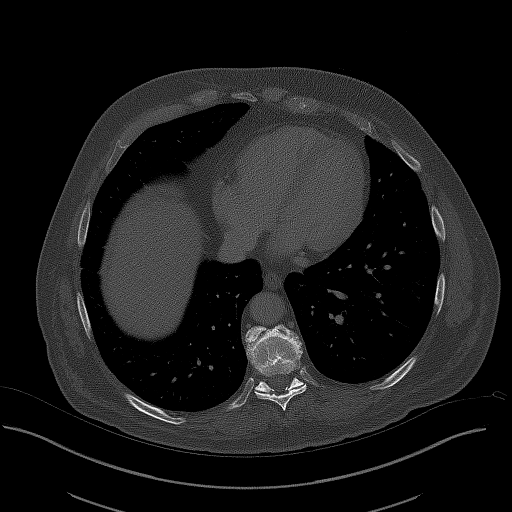

[15 of 46 positions shown; findings below may reference images not displayed]

FINDINGS: CT CHEST FINDINGS

Cardiovascular: Atherosclerotic calcification of the aorta, aortic
valve and coronary arteries. Heart size normal. No pericardial
effusion.

Mediastinum/Nodes: No pathologically enlarged mediastinal, hilar or
axillary lymph nodes. Esophagus is unremarkable.

Lungs/Pleura: A few scattered pulmonary nodules measure up to 4 mm
in the lateral segment right middle lobe (11/80), unchanged. No new
or worrisome pulmonary nodules. No pleural fluid. Airway is
unremarkable.

Musculoskeletal: Old left rib fractures. Degenerative changes in the
spine.

CT ABDOMEN PELVIS FINDINGS

Hepatobiliary: No abnormal arterial phase enhancement. Irregular
low-attenuation mass in the central right hepatic lobe measures
approximately 5.2 x 6.2 cm (7/84), unchanged from [DATE].
Associated decreased attenuation within the right hepatic lobe. Two
separate small nodules in the right hepatic lobe measure up to 10 mm
(89 and 90, respectively), similar to prior exams. A stone is seen
in the gallbladder. No biliary ductal dilatation.

Pancreas: Negative.

Spleen: Negative.

Adrenals/Urinary Tract: Adrenal glands are unremarkable.
Subcentimeter low-attenuation lesions in the kidneys are too small
to characterize but statistically, cysts are likely. Ureters are
decompressed. Bladder is low in volume.

Stomach/Bowel: Stomach, small bowel, appendix and colon are
unremarkable.

Vascular/Lymphatic: Atherosclerotic calcification of the aorta
without aneurysm. Scattered lymph nodes are not enlarged by CT size
criteria. Specifically, periportal lymph nodes measure up to 8 mm
(7/97), unchanged.

Reproductive: Prostate is enlarged and indents the bladder.

Other: Small bilateral inguinal hernias contain fat. No free fluid.
Mesenteries and peritoneum are unremarkable.

Musculoskeletal: No worrisome lytic or sclerotic lesions.
Degenerative changes in the spine.
IMPRESSION: 1. Right hepatic lobe mass and adjacent right hepatic lobe nodules
appear grossly stable. No evidence of distant metastatic disease.
2. Continued stability of small pulmonary nodules. Recommend
attention on follow-up.
3. Cholelithiasis.
4. Enlarged prostate.
5. Aortic atherosclerosis ([TK]-[TK]). Coronary artery
calcification.

## 2019-12-24 IMAGING — CT CT ABD-PELV W/ CM
3 of 9 series · 15 of 46 positions shown, 17 images · IV contrast (OMNIPAQUE)
Comparison: CT abdomen pelvis [DATE] and CT chest [DATE].

CLINICAL DATA: Bile duct cancer on chemotherapy.

EXAM:
CT CHEST, ABDOMEN, AND PELVIS WITH CONTRAST
TECHNIQUE: Multidetector CT imaging of the chest, abdomen and pelvis was
performed following the standard protocol during bolus
administration of intravenous contrast.
CONTRAST:  100mL OMNIPAQUE IOHEXOL 300 MG/ML  SOLN

[Series 3: coronal arterial · coronal · arterial · 0.59mm/px · 3 of 120 slices shown]
[im 30/120  soft-tissue]
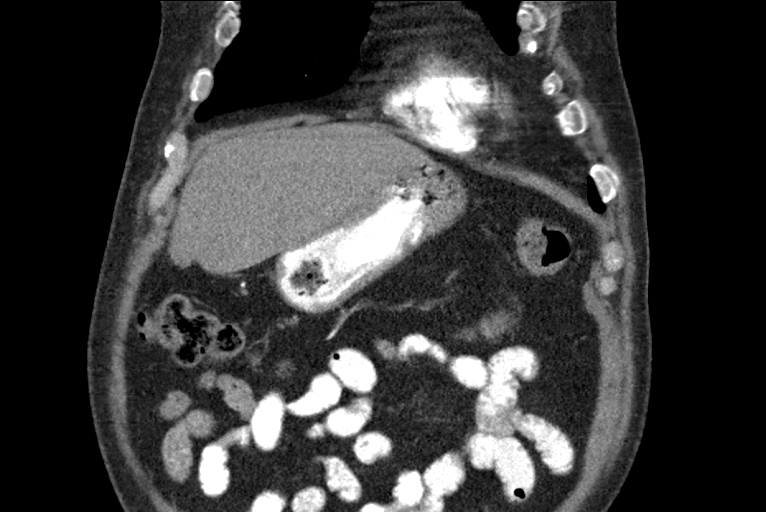
[im 60/120  soft-tissue]
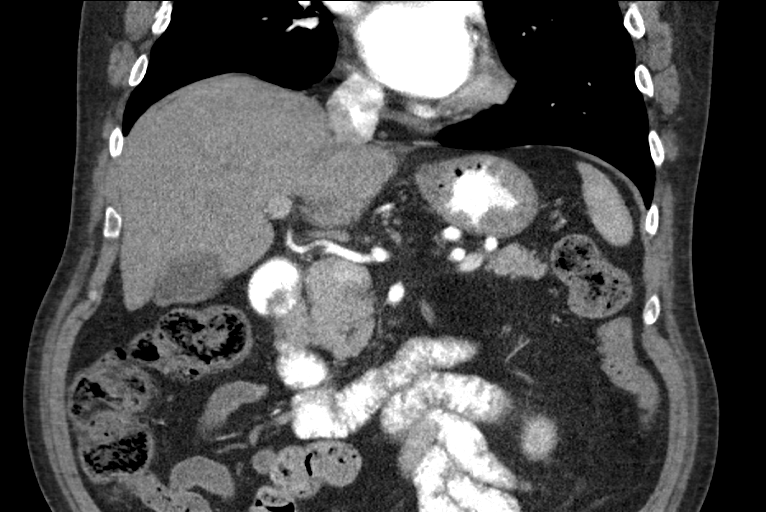
[im 90/120  soft-tissue]
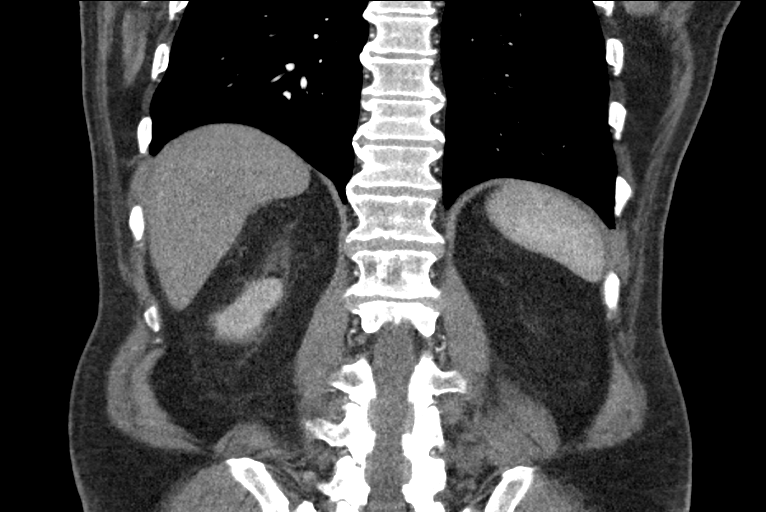

[Series 7: axial venous · axial · portal-venous · 0.84mm/px · z∈[-640,-100]mm · 10 of 222 slices shown, 12 images]
[im 21/222  soft-tissue]
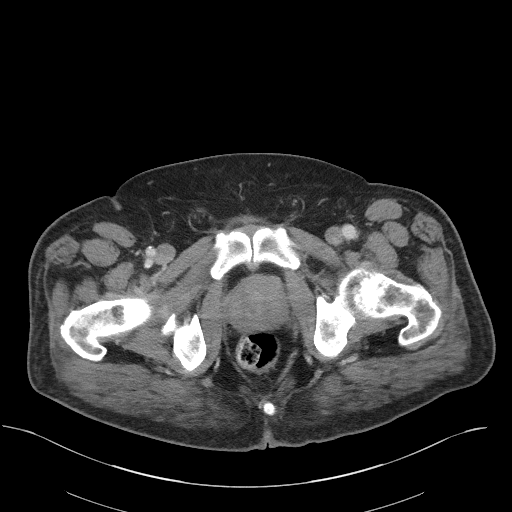
[im 21/222  bone]
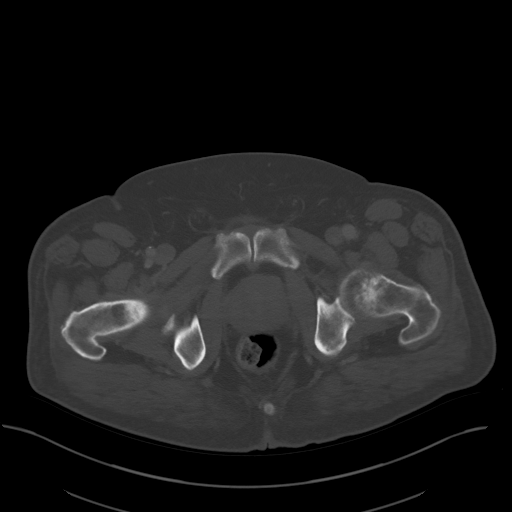
[im 41/222  soft-tissue]
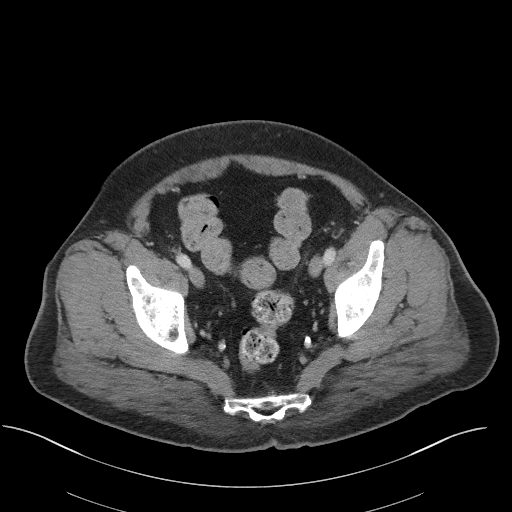
[im 61/222  soft-tissue]
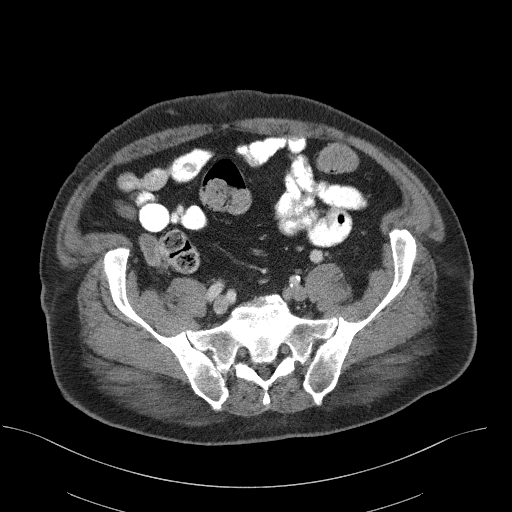
[im 81/222  soft-tissue]
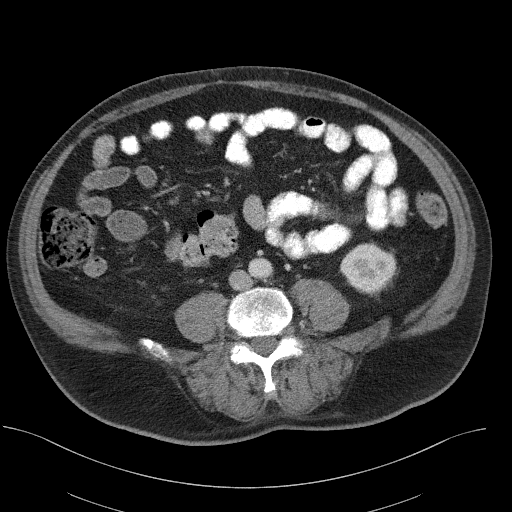
[im 101/222  soft-tissue]
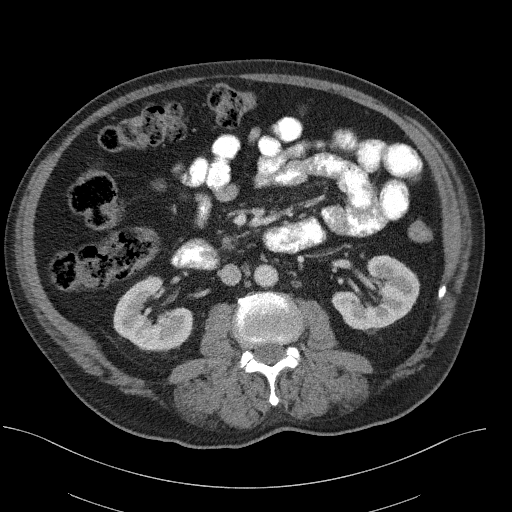
[im 121/222  soft-tissue]
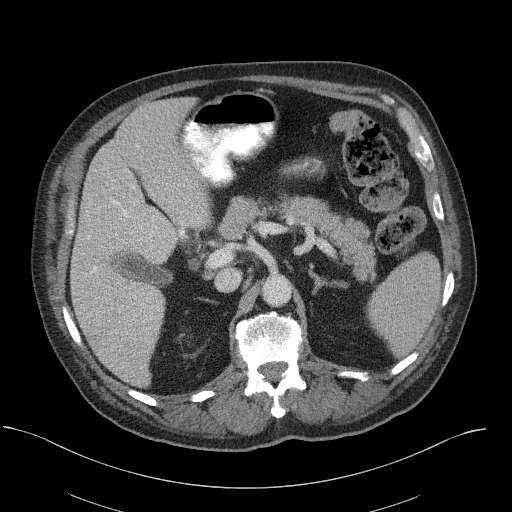
[im 141/222  soft-tissue]
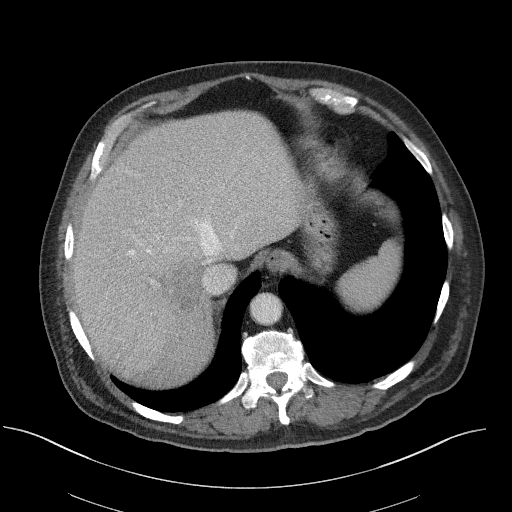
[im 161/222  soft-tissue]
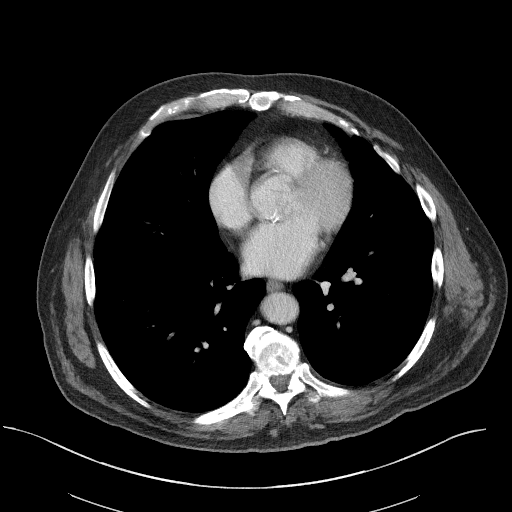
[im 181/222  soft-tissue]
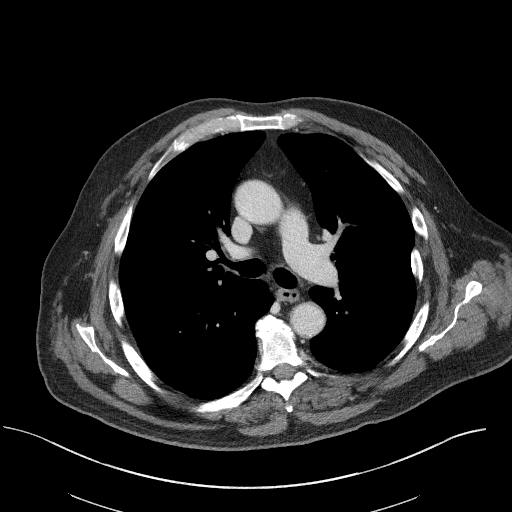
[im 181/222  bone]
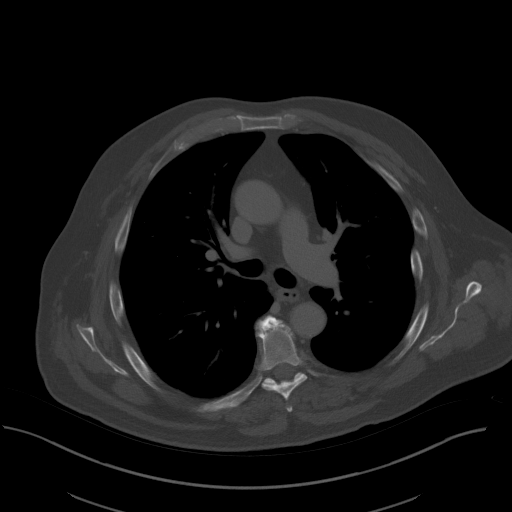
[im 201/222  soft-tissue]
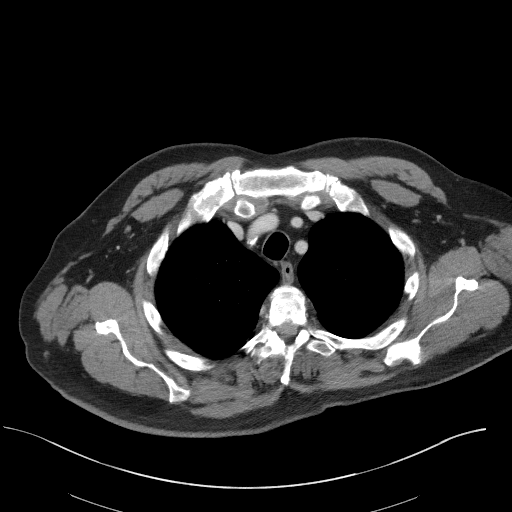

[Series 11: lung portal · axial · portal-venous · 0.84mm/px · z∈[-283,-243]mm · 2 of 144 slices shown]
[im 21/144  bone]
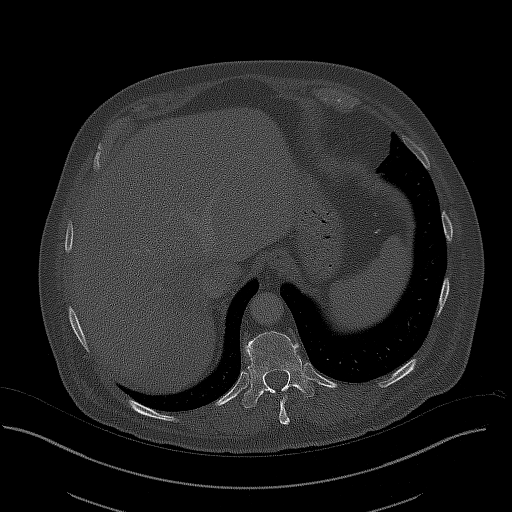
[im 41/144  bone]
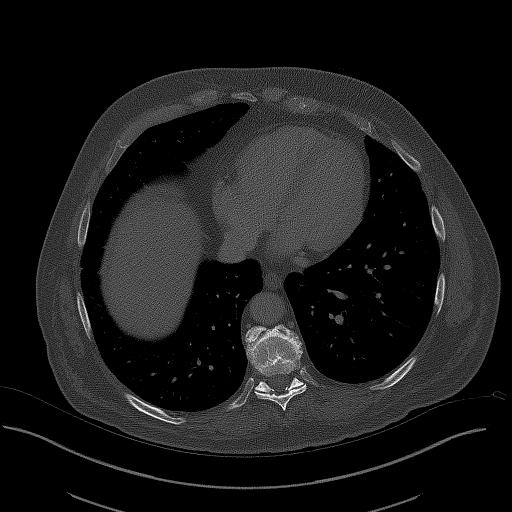

[15 of 46 positions shown; findings below may reference images not displayed]

FINDINGS: CT CHEST FINDINGS

Cardiovascular: Atherosclerotic calcification of the aorta, aortic
valve and coronary arteries. Heart size normal. No pericardial
effusion.

Mediastinum/Nodes: No pathologically enlarged mediastinal, hilar or
axillary lymph nodes. Esophagus is unremarkable.

Lungs/Pleura: A few scattered pulmonary nodules measure up to 4 mm
in the lateral segment right middle lobe (11/80), unchanged. No new
or worrisome pulmonary nodules. No pleural fluid. Airway is
unremarkable.

Musculoskeletal: Old left rib fractures. Degenerative changes in the
spine.

CT ABDOMEN PELVIS FINDINGS

Hepatobiliary: No abnormal arterial phase enhancement. Irregular
low-attenuation mass in the central right hepatic lobe measures
approximately 5.2 x 6.2 cm (7/84), unchanged from [DATE].
Associated decreased attenuation within the right hepatic lobe. Two
separate small nodules in the right hepatic lobe measure up to 10 mm
(89 and 90, respectively), similar to prior exams. A stone is seen
in the gallbladder. No biliary ductal dilatation.

Pancreas: Negative.

Spleen: Negative.

Adrenals/Urinary Tract: Adrenal glands are unremarkable.
Subcentimeter low-attenuation lesions in the kidneys are too small
to characterize but statistically, cysts are likely. Ureters are
decompressed. Bladder is low in volume.

Stomach/Bowel: Stomach, small bowel, appendix and colon are
unremarkable.

Vascular/Lymphatic: Atherosclerotic calcification of the aorta
without aneurysm. Scattered lymph nodes are not enlarged by CT size
criteria. Specifically, periportal lymph nodes measure up to 8 mm
(7/97), unchanged.

Reproductive: Prostate is enlarged and indents the bladder.

Other: Small bilateral inguinal hernias contain fat. No free fluid.
Mesenteries and peritoneum are unremarkable.

Musculoskeletal: No worrisome lytic or sclerotic lesions.
Degenerative changes in the spine.
IMPRESSION: 1. Right hepatic lobe mass and adjacent right hepatic lobe nodules
appear grossly stable. No evidence of distant metastatic disease.
2. Continued stability of small pulmonary nodules. Recommend
attention on follow-up.
3. Cholelithiasis.
4. Enlarged prostate.
5. Aortic atherosclerosis ([TK]-[TK]). Coronary artery
calcification.

## 2019-12-24 MED ORDER — IOHEXOL 300 MG/ML  SOLN
100.0000 mL | Freq: Once | INTRAMUSCULAR | Status: AC | PRN
  Administered 2019-12-24: 100 mL via INTRAVENOUS

## 2019-12-24 MED ORDER — SODIUM CHLORIDE (PF) 0.9 % IJ SOLN
INTRAMUSCULAR | Status: AC
  Filled 2019-12-24: qty 50

## 2019-12-25 NOTE — Progress Notes (Signed)
[place]   Telephone:([phone] [phone]   Clinic Follow up Note   Patient Care Team: [name], PA as PCP - General (Family Medicine) [name], MD as Consulting Physician (General Surgery) [name], [name], MD as Consulting Physician (Gastroenterology) [name], MD as Consulting Physician (Hematology)  Date of Service:  12/29/2019  CHIEF COMPLAINT: F/u of liver cancer  SUMMARY OF ONCOLOGIC HISTORY: Oncology History Overview Note  Cancer Staging Intrahepatic cholangiocarcinoma ([place]) Staging form: Intrahepatic Bile Duct, AJCC 8th Edition - Clinical stage from 08/19/2019: Stage II (cT2, cN0, cM0) - Signed by [name], MD on 08/19/2019    Intrahepatic cholangiocarcinoma ([place])  06/28/2019 Imaging   CT AP W Contrast 06/28/19  IMPRESSION: 1. Heterogeneous hypodensity posteriorly in the right hepatic lobe and potentially extending into the caudate lobe suspicious for a mass. There is felt to be truncation of branches of the portal vein in this vicinity and some narrowing of the hepatic vein, as well as triangular-shaped regions of abnormal hypoenhancement posteriorly in the right hepatic lobe likely representing downstream vascular effects. Cannot exclude malignancy such as cholangiocarcinoma or hepatocellular carcinoma, and follow up hepatic protocol MRI with and without contrast is recommended to further characterize. 2. 4 mm right middle lobe pulmonary nodule is likely benign but may merit surveillance. 3. Cholelithiasis. 4.  Aortic Atherosclerosis (ICD10-I70.0). 5. Prostatomegaly. 6. Mild impingement at L3-4 and L4-5.   07/31/2019 Imaging   MRI Liver 07/31/19 IMPRESSION: 1. 7.3 cm in long axis mass in the right hepatic lobe spanning into the caudate lobe, high suspicion for malignancy such as hepatocellular carcinoma or cholangiocarcinoma. Suspected effacement or occlusion of the right hepatic vein and posterior branches of  the right portal vein. Two smaller tumor nodules along the posterior periphery of the dominant mass. Tissue diagnosis is recommended. 2. No findings of pathologic adenopathy or distant metastatic spread. 3. 9 mm gallstone in the gallbladder. There is mild gallbladder wall thickening which may be from nondistention, correlate clinically in assessing for cholecystitis. 4.  Aortic Atherosclerosis (ICD10-I70.0). 5. Mild diffuse hepatic steatosis.   08/11/2019 Initial Biopsy   DIAGNOSIS: 08/11/19  A. LIVER, RIGHT, BIOPSY:  - Adenocarcinoma.   08/18/2019 Imaging   CT Chest 08/18/19  IMPRESSION: 1. Multiple pulmonary nodules largest at approximately 7 mm in the right lower lobe, nonspecific but concerning given findings in the liver. 2. No signs of definitive metastatic disease, also with mildly enlarged upper abdominal lymph nodes as discussed.   Aortic Atherosclerosis (ICD10-I70.0).   08/19/2019 Initial Diagnosis   Intrahepatic cholangiocarcinoma ([place])   08/19/2019 Cancer Staging   Staging form: Intrahepatic Bile Duct, AJCC 8th Edition - Clinical stage from 08/19/2019: Stage II (cT2, cN0, cM0) - Signed by [name], MD on 08/19/2019   09/29/2019 -  Chemotherapy   Cisplatin and Gemcitabine 2 weeks on/1 week off starting 09/29/19    10/09/2019 Imaging   CT AP IMPRESSION: 1. The dominant right hepatic lobe mass is minimally reduced in size compared to prior exams, currently measuring 6.2 by 5.3 cm, previously 6.2 by 5.6 cm. However, there is a new small hypodense lesion centrally in the right hepatic lobe which is suspicious for a new small focus of tumor. Accordingly this is an overall mixed appearance. 2. Continued hypoenhancement in the liver downstream of the tumor likely attributable to narrowing or occlusion of the right hepatic vein by the tumor. By virtue of its location the tumor wraps around the intrahepatic portion of the IVC. 3. 4 mm  right middle lobe pulmonary nodule, stable  compared to earliest available comparison of 07/18/2019. Surveillance of the patient's pulmonary nodules is recommended. 4. Other imaging findings of potential clinical significance: Coronary atherosclerosis. Cholelithiasis. Prominent stool throughout the colon favors constipation. Moderate prostatomegaly with heterogeneous enhancement of the prostate gland. Lumbar spondylosis and degenerative disc disease causing mild bilateral foraminal impingement at L3-4 and L4-5.   Aortic Atherosclerosis (ICD10-I70.0).   12/24/2019 Imaging   CT CAP W Contrast  IMPRESSION: 1. Right hepatic lobe mass and adjacent right hepatic lobe nodules appear grossly stable. No evidence of distant metastatic disease. 2. Continued stability of small pulmonary nodules. Recommend attention on follow-up. 3. Cholelithiasis. 4. Enlarged prostate. 5. Aortic atherosclerosis (ICD10-I70.0). Coronary artery calcification.      CURRENT THERAPY:  Cisplatin and Gemcitabine 2 weeks on/1 week off starting 09/29/19  INTERVAL HISTORY:  Brandon Sherman is here for a follow up and treatment. He presents to the clinic alone. He notes he is doing fairly well. He notes he has been tolerating chemo well. He denies bladder issues, fever, cough or chest discomfort. He notes he is able to eat and able to maintain his weight. He notes he overall feels better since starting chemo.     REVIEW OF SYSTEMS:   Constitutional: Denies fevers, chills or abnormal weight loss Eyes: Denies blurriness of vision Ears, nose, mouth, throat, and face: Denies mucositis or sore throat Respiratory: Denies cough, dyspnea or wheezes Cardiovascular: Denies palpitation, chest discomfort or lower extremity swelling Gastrointestinal:  Denies nausea, heartburn or change in bowel habits Skin: Denies abnormal skin rashes Lymphatics: Denies new lymphadenopathy or easy bruising Neurological:Denies numbness, tingling or new weaknesses Behavioral/Psych: Mood is  stable, no new changes  All other systems were reviewed with the patient and are negative.  MEDICAL HISTORY:  Past Medical History:  Diagnosis Date  . Arthritis   . Diabetes ([place])   . GERD (gastroesophageal reflux disease)   . Hyperlipidemia   . Hypertension   . Intrahepatic cholangiocarcinoma ([place])     SURGICAL HISTORY: Past Surgical History:  Procedure Laterality Date  . APPENDECTOMY  1980  . COLONOSCOPY      I have reviewed the social history and family history with the patient and they are unchanged from previous note.  ALLERGIES:  has No Known Allergies.  MEDICATIONS:  Current Outpatient Medications  Medication Sig Dispense Refill  . allopurinol (ZYLOPRIM) 100 MG tablet Take 100 mg by mouth daily.    Marland Kitchen amLODipine (NORVASC) 2.5 MG tablet Take 2.5 mg by mouth daily.    Marland Kitchen aspirin EC 81 MG tablet Take 81 mg by mouth daily.    . famotidine (PEPCID) 40 MG tablet Take 0.5 tablets (20 mg total) by mouth 2 (two) times daily. 30 tablet 5  . fenofibrate (TRICOR) 145 MG tablet Take 145 mg by mouth daily.    . finasteride (PROSCAR) 5 MG tablet Take 5 mg by mouth daily.    Marland Kitchen lisinopril (ZESTRIL) 40 MG tablet Take 40 mg by mouth daily.    . metFORMIN (GLUCOPHAGE) 500 MG tablet Take 500 mg by mouth 2 (two) times daily with a meal.     . ondansetron (ZOFRAN) 4 MG tablet Take 4 mg by mouth every 8 (eight) hours as needed for nausea or vomiting.    . ondansetron (ZOFRAN) 8 MG tablet Take 1 tablet (8 mg total) by mouth 2 (two) times daily as needed. Start on the third day after chemotherapy. 30 tablet 1  . pantoprazole (PROTONIX)  40 MG tablet TAKE 1 TABLET BY MOUTH TWICE A DAY 180 tablet 0  . pravastatin (PRAVACHOL) 40 MG tablet Take 40 mg by mouth daily.    . prochlorperazine (COMPAZINE) 10 MG tablet Take 1 tablet (10 mg total) by mouth every 6 (six) hours as needed (Nausea or vomiting). 30 tablet 1  . promethazine (PHENERGAN) 12.5 MG tablet TAKE 1 TABLET BY MOUTH EVERY 6 HOURS AS NEEDED  FOR NAUSEA OR VOMITING. 20 tablet 1  . sucralfate (CARAFATE) 1 g tablet TAKE 1 TABLET (1 G TOTAL) BY MOUTH 4 (FOUR) TIMES DAILY - WITH MEALS AND AT BEDTIME. 90 tablet 1  . tamsulosin (FLOMAX) 0.4 MG CAPS capsule Take 0.4 mg by mouth daily.    . Vitamin D, Ergocalciferol, (DRISDOL) 1.25 MG (50000 UT) CAPS capsule Take 50,000 Units by mouth every 7 (seven) days.     No current facility-administered medications for this visit.    PHYSICAL EXAMINATION: ECOG PERFORMANCE STATUS: 0 - Asymptomatic  Vitals:   12/29/19 0823  BP: 138/70  Pulse: 77  Resp: 18  Temp: 98.5 F (36.9 C)  SpO2: 100%   Filed Weights   12/29/19 0823  Weight: 229 lb 6.4 oz (104.1 kg)    Due to COVID19 we will limit examination to appearance. Patient had no complaints.  GENERAL:alert, no distress and comfortable SKIN: skin color normal, no rashes or significant lesions EYES: normal, Conjunctiva are pink and non-injected, sclera clear  NEURO: alert & oriented x 3 with fluent speech   LABORATORY DATA:  I have reviewed the data as listed CBC Latest Ref Rng & Units 12/29/2019 12/15/2019 12/08/2019  WBC 4.0 - 10.5 K/uL 16.3(H) 3.2(L) 4.6  Hemoglobin 13.0 - 17.0 g/dL 10.6(L) 10.7(L) 11.1(L)  Hematocrit 39.0 - 52.0 % 32.1(L) 31.6(L) 34.1(L)  Platelets 150 - 400 K/uL 237 268 218     CMP Latest Ref Rng & Units 12/29/2019 12/15/2019 12/08/2019  Glucose 70 - 99 mg/dL 115(H) 126(H) 113(H)  BUN 8 - 23 mg/dL 16 18 10   Creatinine 0.61 - 1.24 mg/dL 1.13 1.14 1.09  Sodium 135 - 145 mmol/L 140 136 140  Potassium 3.5 - 5.1 mmol/L 3.9 4.5 4.2  Chloride 98 - 111 mmol/L 108 105 109  CO2 22 - 32 mmol/L 24 21(L) 22  Calcium 8.9 - 10.3 mg/dL 8.9 9.5 9.0  Total Protein 6.5 - 8.1 g/dL 6.5 6.5 6.4(L)  Total Bilirubin 0.3 - 1.2 mg/dL 0.3 0.3 0.5  Alkaline Phos 38 - 126 U/L 77 41 45  AST 15 - 41 U/L 18 16 19   ALT 0 - 44 U/L 19 19 19       RADIOGRAPHIC STUDIES: I have personally reviewed the radiological images as listed and agreed  with the findings in the report. No results found.   ASSESSMENT & PLAN:  Brandon Sherman is a 74 y.o. male with    1.Intrahepatic cholangiocarcinoma, cT2N0Mx,unresectable,with indeterminate lung nodules -He was diagnosed in 07/2019.CT scans and MRI liver showa large7.3cm mass in the right hepatic lobe whichabutsportal vein.  -He was seen byour local surgeon Dr. Barry Dienes andDr Carlis [facility] at [facility] concluded that cancer is not resectable due to the invasion to portal vein. -I discussed given his cancer is non-resectable his cancer is likely not curable but still treatable. Istarted him onstandardfirst line chemo with IV Cisplatin and Gemcitabine 2 weeks on/1 week off beginning 09/29/19.  -I requested FO on his liver biopsy, results are pending.  -We discussed his CT CAP from 12/24/19 which show stable  disease with no new metastasis, I reviewed the images in person and discussed with him. Will continue treatment for as long as he can tolerate and this is controlling his disease. He understands.  -I reviewed incidental findings of gallstone which he is asymptomatic, enlarged prostate which he will continue to follow up with urologist and coronary artery calcification.  -He has been clinically doing well and he is tolerating chemo well. Labs reviewed and adequate to proceed with C5 Gem/Cis today.  -F/u in 3 weeks     2. Nausea, Low food intakeand weight loss -With pain from GERD and nausea he initially lost 30 pounds. -His nausea and GERD is based on what he eats. Controlled onPhenergan, Sucralfate and Pepcid. -Hewaspreviouslyseen by Dietician. -No current nausea or vomiting on chemo and using less antiemetics. For his mild constipation he can continue OTC stool softeners. His weight is stable as he is eating adequately. -overall much improved   3. DM, HTN, HLD, Gout  -On Metformin, amlodipine, lisinopril., allopurinol  -Continue to f/u with his PCP  -Will monitor with  treatment.We discussed that chemotherapy may impact his sugar level and blood pressure, I may adjust his medication if needed.  4. Nicotine Use -He never smoked but has been chewing Tobacco for the past 60 years.He no longer drinks alcohol. -He now chews 1-2 times a day.Iagainencouraged him toreduce and quit completely.  5.GERDand gastritis, history ofesophageal candidiasis -Hehad repeated EGDwith Dr. Havery Moros in 07/2019. 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  -Will monitor andmayrepeat EGDin future   PLAN: -CT CAP reviewed, stable disease.  -Labs reviewed and adequate to proceed with C5D1 Cis/Gem today, Fulphila injection on day 9 -Lab and chemo Gem/Cis in 1, 3,4 weeks  -F/u in 3 weeks   No problem-specific Assessment & Plan notes found for this encounter.   No orders of the defined types were placed in this encounter.  All questions were answered. The patient knows to call the clinic with any problems, questions or concerns. No barriers to learning was detected. The total time spent in the appointment was 30 minutes.     [name], MD 12/29/2019   I, Joslyn Devon, am acting as scribe for [name], MD.   I have reviewed the above documentation for accuracy and completeness, and I agree with the above.

## 2019-12-29 ENCOUNTER — Other Ambulatory Visit: Payer: Self-pay

## 2019-12-29 ENCOUNTER — Telehealth: Payer: Self-pay | Admitting: Hematology

## 2019-12-29 ENCOUNTER — Inpatient Hospital Stay: Payer: Medicare HMO

## 2019-12-29 ENCOUNTER — Encounter: Payer: Self-pay | Admitting: Hematology

## 2019-12-29 ENCOUNTER — Inpatient Hospital Stay: Payer: Medicare HMO | Attending: Hematology

## 2019-12-29 ENCOUNTER — Inpatient Hospital Stay (HOSPITAL_BASED_OUTPATIENT_CLINIC_OR_DEPARTMENT_OTHER): Payer: Medicare HMO | Admitting: Hematology

## 2019-12-29 VITALS — BP 138/70 | HR 77 | Temp 98.5°F | Resp 18 | Ht 69.0 in | Wt 229.4 lb

## 2019-12-29 DIAGNOSIS — C221 Intrahepatic bile duct carcinoma: Secondary | ICD-10-CM | POA: Diagnosis not present

## 2019-12-29 DIAGNOSIS — Z7189 Other specified counseling: Secondary | ICD-10-CM

## 2019-12-29 DIAGNOSIS — Z5189 Encounter for other specified aftercare: Secondary | ICD-10-CM | POA: Diagnosis not present

## 2019-12-29 DIAGNOSIS — Z5111 Encounter for antineoplastic chemotherapy: Secondary | ICD-10-CM | POA: Diagnosis not present

## 2019-12-29 LAB — CMP (CANCER CENTER ONLY)
ALT: 19 U/L (ref 0–44)
AST: 18 U/L (ref 15–41)
Albumin: 3.9 g/dL (ref 3.5–5.0)
Alkaline Phosphatase: 77 U/L (ref 38–126)
Anion gap: 8 (ref 5–15)
BUN: 16 mg/dL (ref 8–23)
CO2: 24 mmol/L (ref 22–32)
Calcium: 8.9 mg/dL (ref 8.9–10.3)
Chloride: 108 mmol/L (ref 98–111)
Creatinine: 1.13 mg/dL (ref 0.61–1.24)
GFR, Est AFR Am: >60 mL/min (ref >60)
GFR, Estimated: >60 mL/min (ref >60)
Glucose, Bld: 115 mg/dL — ABNORMAL HIGH (ref 70–99)
Potassium: 3.9 mmol/L (ref 3.5–5.1)
Sodium: 140 mmol/L (ref 135–145)
Total Bilirubin: 0.3 mg/dL (ref 0.3–1.2)
Total Protein: 6.5 g/dL (ref 6.5–8.1)

## 2019-12-29 LAB — CBC WITH DIFFERENTIAL (CANCER CENTER ONLY)
Abs Immature Granulocytes: 0.52 10*3/uL — ABNORMAL HIGH (ref 0.00–0.07)
Basophils Absolute: 0.1 10*3/uL (ref 0.0–0.1)
Basophils Relative: 0 %
Eosinophils Absolute: 0.1 10*3/uL (ref 0.0–0.5)
Eosinophils Relative: 0 %
HCT: 32.1 % — ABNORMAL LOW (ref 39.0–52.0)
Hemoglobin: 10.6 g/dL — ABNORMAL LOW (ref 13.0–17.0)
Immature Granulocytes: 3 %
Lymphocytes Relative: 11 %
Lymphs Abs: 1.7 10*3/uL (ref 0.7–4.0)
MCH: 32.8 pg (ref 26.0–34.0)
MCHC: 33 g/dL (ref 30.0–36.0)
MCV: 99.4 fL (ref 80.0–100.0)
Monocytes Absolute: 1.2 10*3/uL — ABNORMAL HIGH (ref 0.1–1.0)
Monocytes Relative: 8 %
Neutro Abs: 12.8 10*3/uL — ABNORMAL HIGH (ref 1.7–7.7)
Neutrophils Relative %: 78 %
Platelet Count: 237 10*3/uL (ref 150–400)
RBC: 3.23 MIL/uL — ABNORMAL LOW (ref 4.22–5.81)
RDW: 19.3 % — ABNORMAL HIGH (ref 11.5–15.5)
WBC Count: 16.3 10*3/uL — ABNORMAL HIGH (ref 4.0–10.5)
nRBC: 0.2 % (ref 0.0–0.2)

## 2019-12-29 LAB — MAGNESIUM: Magnesium: 1.9 mg/dL (ref 1.7–2.4)

## 2019-12-29 MED ORDER — PALONOSETRON HCL INJECTION 0.25 MG/5ML
INTRAVENOUS | Status: AC
  Filled 2019-12-29: qty 5

## 2019-12-29 MED ORDER — SODIUM CHLORIDE 0.9 % IV SOLN
Freq: Once | INTRAVENOUS | Status: AC
  Filled 2019-12-29: qty 5

## 2019-12-29 MED ORDER — SODIUM CHLORIDE 0.9 % IV SOLN
Freq: Once | INTRAVENOUS | Status: AC
  Filled 2019-12-29: qty 250

## 2019-12-29 MED ORDER — POTASSIUM CHLORIDE 2 MEQ/ML IV SOLN
Freq: Once | INTRAVENOUS | Status: AC
  Filled 2019-12-29: qty 10

## 2019-12-29 MED ORDER — SODIUM CHLORIDE 0.9 % IV SOLN
25.0000 mg/m2 | Freq: Once | INTRAVENOUS | Status: AC
  Administered 2019-12-29: 54 mg via INTRAVENOUS
  Filled 2019-12-29: qty 54

## 2019-12-29 MED ORDER — SODIUM CHLORIDE 0.9 % IV SOLN
1000.0000 mg/m2 | Freq: Once | INTRAVENOUS | Status: AC
  Administered 2019-12-29: 2166 mg via INTRAVENOUS
  Filled 2019-12-29: qty 56.97

## 2019-12-29 MED ORDER — PALONOSETRON HCL INJECTION 0.25 MG/5ML
0.2500 mg | Freq: Once | INTRAVENOUS | Status: AC
  Administered 2019-12-29: 0.25 mg via INTRAVENOUS

## 2019-12-29 NOTE — Telephone Encounter (Signed)
Scheduled appt per 2/8 los.  Patient will get an updated appt calendar after treatment.

## 2019-12-29 NOTE — Patient Instructions (Signed)
[facility] Discharge Instructions for Patients Receiving Chemotherapy  Today you received the following chemotherapy agents Gemzar; Cisplatin  To help prevent nausea and vomiting after your treatment, we encourage you to take your nausea medication as directed If you develop nausea and vomiting that is not controlled by your nausea medication, call the clinic.   BELOW ARE SYMPTOMS THAT SHOULD BE REPORTED IMMEDIATELY:  *FEVER GREATER THAN 100.5 F  *CHILLS WITH OR WITHOUT FEVER  NAUSEA AND VOMITING THAT IS NOT CONTROLLED WITH YOUR NAUSEA MEDICATION  *UNUSUAL SHORTNESS OF BREATH  *UNUSUAL BRUISING OR BLEEDING  TENDERNESS IN MOUTH AND THROAT WITH OR WITHOUT PRESENCE OF ULCERS  *URINARY PROBLEMS  *BOWEL PROBLEMS  UNUSUAL RASH Items with * indicate a potential emergency and should be followed up as soon as possible.  Feel free to call the clinic should you have any questions or concerns. The clinic phone number is [phone].  Please show the CHEMO ALERT CARD at check-in to the Emergency Department and triage nurse.   

## 2019-12-30 ENCOUNTER — Encounter (HOSPITAL_COMMUNITY): Payer: Self-pay | Admitting: Hematology

## 2020-01-01 ENCOUNTER — Other Ambulatory Visit: Payer: Self-pay | Admitting: Gastroenterology

## 2020-01-01 ENCOUNTER — Telehealth: Payer: Self-pay | Admitting: Gastroenterology

## 2020-01-01 NOTE — Telephone Encounter (Signed)
Called patient back and spoke to wife. Let her know Dr. Havery Moros did review the CT and gave her his comments on it. She said her husband just wanted to know that Dr. Havery Moros can see anything the cancer center does, since he referred him. I told her we do share information

## 2020-01-01 NOTE — Telephone Encounter (Signed)
I reviewed the images, his cancer appears to be stable without any worsening since the last images were taken, which is good news. Sounds like Dr. Burr Medico wants him to continue with the therapy. If he has any other specific questions please let me know. Thanks

## 2020-01-05 ENCOUNTER — Encounter: Payer: Self-pay | Admitting: Hematology

## 2020-01-05 ENCOUNTER — Inpatient Hospital Stay: Payer: Medicare HMO

## 2020-01-05 ENCOUNTER — Other Ambulatory Visit: Payer: Self-pay

## 2020-01-05 ENCOUNTER — Inpatient Hospital Stay: Payer: Medicare HMO | Admitting: Hematology

## 2020-01-05 VITALS — BP 164/79 | HR 70 | Temp 99.1°F | Resp 17 | Ht 69.0 in | Wt 229.9 lb

## 2020-01-05 DIAGNOSIS — C221 Intrahepatic bile duct carcinoma: Secondary | ICD-10-CM

## 2020-01-05 DIAGNOSIS — Z7189 Other specified counseling: Secondary | ICD-10-CM

## 2020-01-05 DIAGNOSIS — Z5111 Encounter for antineoplastic chemotherapy: Secondary | ICD-10-CM | POA: Diagnosis not present

## 2020-01-05 DIAGNOSIS — Z5189 Encounter for other specified aftercare: Secondary | ICD-10-CM | POA: Diagnosis not present

## 2020-01-05 LAB — CMP (CANCER CENTER ONLY)
ALT: 16 U/L (ref 0–44)
AST: 13 U/L — ABNORMAL LOW (ref 15–41)
Albumin: 3.7 g/dL (ref 3.5–5.0)
Alkaline Phosphatase: 53 U/L (ref 38–126)
Anion gap: 11 (ref 5–15)
BUN: 20 mg/dL (ref 8–23)
CO2: 22 mmol/L (ref 22–32)
Calcium: 9.3 mg/dL (ref 8.9–10.3)
Chloride: 106 mmol/L (ref 98–111)
Creatinine: 1.23 mg/dL (ref 0.61–1.24)
GFR, Est AFR Am: >60 mL/min (ref >60)
GFR, Estimated: 59 mL/min — ABNORMAL LOW (ref >60)
Glucose, Bld: 117 mg/dL — ABNORMAL HIGH (ref 70–99)
Potassium: 4.3 mmol/L (ref 3.5–5.1)
Sodium: 139 mmol/L (ref 135–145)
Total Bilirubin: 0.3 mg/dL (ref 0.3–1.2)
Total Protein: 6.5 g/dL (ref 6.5–8.1)

## 2020-01-05 LAB — CBC WITH DIFFERENTIAL (CANCER CENTER ONLY)
Abs Immature Granulocytes: 0.46 10*3/uL — ABNORMAL HIGH (ref 0.00–0.07)
Basophils Absolute: 0.1 10*3/uL (ref 0.0–0.1)
Basophils Relative: 1 %
Eosinophils Absolute: 0 10*3/uL (ref 0.0–0.5)
Eosinophils Relative: 0 %
HCT: 30.2 % — ABNORMAL LOW (ref 39.0–52.0)
Hemoglobin: 10.1 g/dL — ABNORMAL LOW (ref 13.0–17.0)
Immature Granulocytes: 4 %
Lymphocytes Relative: 19 %
Lymphs Abs: 2 10*3/uL (ref 0.7–4.0)
MCH: 33.1 pg (ref 26.0–34.0)
MCHC: 33.4 g/dL (ref 30.0–36.0)
MCV: 99 fL (ref 80.0–100.0)
Monocytes Absolute: 0.9 10*3/uL (ref 0.1–1.0)
Monocytes Relative: 9 %
Neutro Abs: 7 10*3/uL (ref 1.7–7.7)
Neutrophils Relative %: 67 %
Platelet Count: 288 10*3/uL (ref 150–400)
RBC: 3.05 MIL/uL — ABNORMAL LOW (ref 4.22–5.81)
RDW: 17.1 % — ABNORMAL HIGH (ref 11.5–15.5)
WBC Count: 10.4 10*3/uL (ref 4.0–10.5)
nRBC: 0 % (ref 0.0–0.2)

## 2020-01-05 LAB — MAGNESIUM: Magnesium: 1.7 mg/dL (ref 1.7–2.4)

## 2020-01-05 MED ORDER — POTASSIUM CHLORIDE 2 MEQ/ML IV SOLN
Freq: Once | INTRAVENOUS | Status: AC
  Filled 2020-01-05: qty 10

## 2020-01-05 MED ORDER — SODIUM CHLORIDE 0.9 % IV SOLN
25.0000 mg/m2 | Freq: Once | INTRAVENOUS | Status: AC
  Administered 2020-01-05: 13:00:00 54 mg via INTRAVENOUS
  Filled 2020-01-05: qty 54

## 2020-01-05 MED ORDER — SODIUM CHLORIDE 0.9 % IV SOLN
1000.0000 mg/m2 | Freq: Once | INTRAVENOUS | Status: AC
  Administered 2020-01-05: 2166 mg via INTRAVENOUS
  Filled 2020-01-05: qty 56.97

## 2020-01-05 MED ORDER — PALONOSETRON HCL INJECTION 0.25 MG/5ML
INTRAVENOUS | Status: AC
  Filled 2020-01-05: qty 5

## 2020-01-05 MED ORDER — SODIUM CHLORIDE 0.9 % IV SOLN
Freq: Once | INTRAVENOUS | Status: AC
  Filled 2020-01-05: qty 5

## 2020-01-05 MED ORDER — PALONOSETRON HCL INJECTION 0.25 MG/5ML
0.2500 mg | Freq: Once | INTRAVENOUS | Status: AC
  Administered 2020-01-05: 0.25 mg via INTRAVENOUS

## 2020-01-05 MED ORDER — SODIUM CHLORIDE 0.9 % IV SOLN
Freq: Once | INTRAVENOUS | Status: AC
  Filled 2020-01-05: qty 250

## 2020-01-05 NOTE — Patient Instructions (Signed)
[facility] Discharge Instructions for Patients Receiving Chemotherapy  Today you received the following chemotherapy agents Gemzar; Cisplatin  To help prevent nausea and vomiting after your treatment, we encourage you to take your nausea medication as directed If you develop nausea and vomiting that is not controlled by your nausea medication, call the clinic.   BELOW ARE SYMPTOMS THAT SHOULD BE REPORTED IMMEDIATELY:  *FEVER GREATER THAN 100.5 F  *CHILLS WITH OR WITHOUT FEVER  NAUSEA AND VOMITING THAT IS NOT CONTROLLED WITH YOUR NAUSEA MEDICATION  *UNUSUAL SHORTNESS OF BREATH  *UNUSUAL BRUISING OR BLEEDING  TENDERNESS IN MOUTH AND THROAT WITH OR WITHOUT PRESENCE OF ULCERS  *URINARY PROBLEMS  *BOWEL PROBLEMS  UNUSUAL RASH Items with * indicate a potential emergency and should be followed up as soon as possible.  Feel free to call the clinic should you have any questions or concerns. The clinic phone number is [phone].  Please show the CHEMO ALERT CARD at check-in to the Emergency Department and triage nurse.   

## 2020-01-05 NOTE — Progress Notes (Signed)
[place]   [phone] [phone]   Clinic Follow up Note   Patient Care Team: [name], PA as PCP - General (Family Medicine) [name], MD as Consulting Physician (General Surgery) [name], [name], MD as Consulting Physician (Gastroenterology) [name], MD as Consulting Physician (Hematology)  Date of Service:  01/05/2020  CHIEF COMPLAINT: F/u of liver cancer  SUMMARY OF ONCOLOGIC HISTORY: Oncology History Overview Note  Cancer Staging Intrahepatic cholangiocarcinoma ([place]) Staging form: Intrahepatic Bile Duct, AJCC 8th Edition - Clinical stage from 08/19/2019: Stage II (cT2, cN0, cM0) - Signed by [name], MD on 08/19/2019    Intrahepatic cholangiocarcinoma ([place])  06/28/2019 Imaging   CT AP W Contrast 06/28/19  IMPRESSION: 1. Heterogeneous hypodensity posteriorly in the right hepatic lobe and potentially extending into the caudate lobe suspicious for a mass. There is felt to be truncation of branches of the portal vein in this vicinity and some narrowing of the hepatic vein, as well as triangular-shaped regions of abnormal hypoenhancement posteriorly in the right hepatic lobe likely representing downstream vascular effects. Cannot exclude malignancy such as cholangiocarcinoma or hepatocellular carcinoma, and follow up hepatic protocol MRI with and without contrast is recommended to further characterize. 2. 4 mm right middle lobe pulmonary nodule is likely benign but may merit surveillance. 3. Cholelithiasis. 4.  Aortic Atherosclerosis (ICD10-I70.0). 5. Prostatomegaly. 6. Mild impingement at L3-4 and L4-5.   07/31/2019 Imaging   MRI Liver 07/31/19 IMPRESSION: 1. 7.3 cm in long axis mass in the right hepatic lobe spanning into the caudate lobe, high suspicion for malignancy such as hepatocellular carcinoma or cholangiocarcinoma. Suspected effacement or occlusion of the right hepatic vein and posterior branches of  the right portal vein. Two smaller tumor nodules along the posterior periphery of the dominant mass. Tissue diagnosis is recommended. 2. No findings of pathologic adenopathy or distant metastatic spread. 3. 9 mm gallstone in the gallbladder. There is mild gallbladder wall thickening which may be from nondistention, correlate clinically in assessing for cholecystitis. 4.  Aortic Atherosclerosis (ICD10-I70.0). 5. Mild diffuse hepatic steatosis.   08/11/2019 Initial Biopsy   DIAGNOSIS: 08/11/19  A. LIVER, RIGHT, BIOPSY:  - Adenocarcinoma.   08/18/2019 Imaging   CT Chest 08/18/19  IMPRESSION: 1. Multiple pulmonary nodules largest at approximately 7 mm in the right lower lobe, nonspecific but concerning given findings in the liver. 2. No signs of definitive metastatic disease, also with mildly enlarged upper abdominal lymph nodes as discussed.   Aortic Atherosclerosis (ICD10-I70.0).   08/19/2019 Initial Diagnosis   Intrahepatic cholangiocarcinoma ([place])   08/19/2019 Cancer Staging   Staging form: Intrahepatic Bile Duct, AJCC 8th Edition - Clinical stage from 08/19/2019: Stage II (cT2, cN0, cM0) - Signed by [name], MD on 08/19/2019   09/29/2019 -  Chemotherapy   Cisplatin and Gemcitabine 2 weeks on/1 week off starting 09/29/19    10/09/2019 Imaging   CT AP IMPRESSION: 1. The dominant right hepatic lobe mass is minimally reduced in size compared to prior exams, currently measuring 6.2 by 5.3 cm, previously 6.2 by 5.6 cm. However, there is a new small hypodense lesion centrally in the right hepatic lobe which is suspicious for a new small focus of tumor. Accordingly this is an overall mixed appearance. 2. Continued hypoenhancement in the liver downstream of the tumor likely attributable to narrowing or occlusion of the right hepatic vein by the tumor. By virtue of its location the tumor wraps around the intrahepatic portion of the IVC. 3. 4 mm  right middle lobe pulmonary nodule, stable  compared to earliest available comparison of 07/18/2019. Surveillance of the patient's pulmonary nodules is recommended. 4. Other imaging findings of potential clinical significance: Coronary atherosclerosis. Cholelithiasis. Prominent stool throughout the colon favors constipation. Moderate prostatomegaly with heterogeneous enhancement of the prostate gland. Lumbar spondylosis and degenerative disc disease causing mild bilateral foraminal impingement at L3-4 and L4-5.   Aortic Atherosclerosis (ICD10-I70.0).   12/24/2019 Imaging   CT CAP W Contrast  IMPRESSION: 1. Right hepatic lobe mass and adjacent right hepatic lobe nodules appear grossly stable. No evidence of distant metastatic disease. 2. Continued stability of small pulmonary nodules. Recommend attention on follow-up. 3. Cholelithiasis. 4. Enlarged prostate. 5. Aortic atherosclerosis (ICD10-I70.0). Coronary artery calcification.      CURRENT THERAPY:  Cisplatin and Gemcitabine 2 weeks on/1 week off starting 09/29/19  INTERVAL HISTORY:  Brandon Sherman is here for a follow up. He presents to the clinic alone.  He is clinically doing very well, denies any pain, nausea, or other discomfort.  He has good appetite and energy level, he functions well at home.  Review of systems negative.  MEDICAL HISTORY:  Past Medical History:  Diagnosis Date  . Arthritis   . Diabetes ([place])   . GERD (gastroesophageal reflux disease)   . Hyperlipidemia   . Hypertension   . Intrahepatic cholangiocarcinoma ([place])     SURGICAL HISTORY: Past Surgical History:  Procedure Laterality Date  . APPENDECTOMY  1980  . COLONOSCOPY      I have reviewed the social history and family history with the patient and they are unchanged from previous note.  ALLERGIES:  has No Known Allergies.  MEDICATIONS:  Current Outpatient Medications  Medication Sig Dispense Refill  . allopurinol (ZYLOPRIM) 100 MG tablet Take 100 mg by mouth daily.    Marland Kitchen amLODipine  (NORVASC) 2.5 MG tablet Take 2.5 mg by mouth daily.    Marland Kitchen aspirin EC 81 MG tablet Take 81 mg by mouth daily.    . famotidine (PEPCID) 40 MG tablet Take 0.5 tablets (20 mg total) by mouth 2 (two) times daily. 30 tablet 5  . fenofibrate (TRICOR) 145 MG tablet Take 145 mg by mouth daily.    . finasteride (PROSCAR) 5 MG tablet Take 5 mg by mouth daily.    Marland Kitchen lisinopril (ZESTRIL) 40 MG tablet Take 40 mg by mouth daily.    . metFORMIN (GLUCOPHAGE) 500 MG tablet Take 500 mg by mouth 2 (two) times daily with a meal.     . ondansetron (ZOFRAN) 4 MG tablet Take 4 mg by mouth every 8 (eight) hours as needed for nausea or vomiting.    . ondansetron (ZOFRAN) 8 MG tablet Take 1 tablet (8 mg total) by mouth 2 (two) times daily as needed. Start on the third day after chemotherapy. 30 tablet 1  . pantoprazole (PROTONIX) 40 MG tablet TAKE 1 TABLET BY MOUTH TWICE A DAY 180 tablet 0  . pravastatin (PRAVACHOL) 40 MG tablet Take 40 mg by mouth daily.    . prochlorperazine (COMPAZINE) 10 MG tablet Take 1 tablet (10 mg total) by mouth every 6 (six) hours as needed (Nausea or vomiting). 30 tablet 1  . promethazine (PHENERGAN) 12.5 MG tablet TAKE 1 TABLET BY MOUTH EVERY 6 HOURS AS NEEDED FOR NAUSEA OR VOMITING. 20 tablet 1  . sucralfate (CARAFATE) 1 g tablet Take 1 tablet (1 g total) by mouth every 6 (six) hours as needed. 180 tablet 1  . tamsulosin (FLOMAX) 0.4 MG CAPS  capsule Take 0.4 mg by mouth daily.    . Vitamin D, Ergocalciferol, (DRISDOL) 1.25 MG (50000 UT) CAPS capsule Take 50,000 Units by mouth every 7 (seven) days.     No current facility-administered medications for this visit.    PHYSICAL EXAMINATION: ECOG PERFORMANCE STATUS: 0 - Asymptomatic  Vitals:   01/05/20 0823  BP: (!) 164/79  Pulse: 70  Resp: 17  Temp: 99.1 F (37.3 C)  SpO2: 100%   Filed Weights   01/05/20 0823  Weight: 229 lb 14.4 oz (104.3 kg)    GENERAL:alert, no distress and comfortable SKIN: skin color, texture, turgor are  normal, no rashes or significant lesions `Musculoskeletal:no cyanosis of digits and no clubbing  NEURO: alert & oriented x 3 with fluent speech, no focal motor/sensory deficits  LABORATORY DATA:  I have reviewed the data as listed CBC Latest Ref Rng & Units 01/05/2020 12/29/2019 12/15/2019  WBC 4.0 - 10.5 K/uL 10.4 16.3(H) 3.2(L)  Hemoglobin 13.0 - 17.0 g/dL 10.1(L) 10.6(L) 10.7(L)  Hematocrit 39.0 - 52.0 % 30.2(L) 32.1(L) 31.6(L)  Platelets 150 - 400 K/uL 288 237 268     CMP Latest Ref Rng & Units 01/05/2020 12/29/2019 12/15/2019  Glucose 70 - 99 mg/dL 117(H) 115(H) 126(H)  BUN 8 - 23 mg/dL 20 16 18   Creatinine 0.61 - 1.24 mg/dL 1.23 1.13 1.14  Sodium 135 - 145 mmol/L 139 140 136  Potassium 3.5 - 5.1 mmol/L 4.3 3.9 4.5  Chloride 98 - 111 mmol/L 106 108 105  CO2 22 - 32 mmol/L 22 24 21(L)  Calcium 8.9 - 10.3 mg/dL 9.3 8.9 9.5  Total Protein 6.5 - 8.1 g/dL 6.5 6.5 6.5  Total Bilirubin 0.3 - 1.2 mg/dL 0.3 0.3 0.3  Alkaline Phos 38 - 126 U/L 53 77 41  AST 15 - 41 U/L 13(L) 18 16  ALT 0 - 44 U/L 16 19 19       RADIOGRAPHIC STUDIES: I have personally reviewed the radiological images as listed and agreed with the findings in the report. No results found.   ASSESSMENT & PLAN:  Brandon Sherman is a 74 y.o. male with    1.Intrahepatic cholangiocarcinoma, cT2N0Mx,unresectable,with indeterminate lung nodules -He was diagnosed in 07/2019.CT scans and MRI liver showa large7.3cm mass in the right hepatic lobe whichabutsportal vein.  -He was seen byour local surgeon Dr. Barry Dienes andDr Carlis Abbott at [facility] concluded that cancer is not resectable due to the invasion to portal vein. -I discussed given his cancer is non-resectable his cancer is likely not curable but still treatable. Istarted him onstandardfirst line chemo with IV Cisplatin and Gemcitabine 2 weeks on/1 weekoff beginning 09/29/19. -Recent restaging scan showed stable disease, will continue chemo   -I reviewed the  [employer] genomic testing results, which showed MSI stable disease, IDH 1 mutation, so he may benefit from IDH inhibitor in the future.  I discussed the results and possible treatment options with him today.  He voiced good understanding. -He has been clinically doing well and he is tolerating chemo well. Labs reviewed and adequate to proceed with C5 Gem/Cis today.  -F/u in 3 weeks     2. Nausea, Low food intakeand weight loss -With pain from GERD and nausea he initially lost 30 pounds. -His nausea and GERD is based on what he eats. Controlled onPhenergan, Sucralfate and Pepcid. -Hewaspreviouslyseen by Dietician. -No current nausea or vomiting on chemo and using less antiemetics.For his mild constipation he can continue OTC stool softeners.His weight is stable as he is  eating adequately. -overall much improved   3. DM, HTN, HLD, Gout  -On Metformin, amlodipine, lisinopril., allopurinol  -Continue to f/u with his PCP  -Will monitor with treatment.We discussed that chemotherapy may impact his sugar level and blood pressure, I may adjust his medication if needed.  4. Nicotine Use -He never smoked but has been chewing Tobacco for the past 60 years.He no longer drinks alcohol. -He now chews 1-2 times a day.Iagainencouraged him toreduce and quit completely.  5.GERDand gastritis, history ofesophageal candidiasis -Hehad repeated EGDwith Dr. Havery Moros in 07/2019. 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  -Will monitor andmayrepeat EGDin future   PLAN: -FO result reviewed  -Labs reviewed and adequate to proceed with C5D1Cis/Gem today, Fulphila injectionon day 9 -Lab and chemo Gem/Cis in 1, 3,4 weeks  -F/u in 3 weeks   No problem-specific Assessment & Plan notes found for this encounter.   No orders of the defined types were placed in this  encounter.  All questions were answered. The patient knows to call the clinic with any problems, questions or concerns. No barriers to learning was detected. The total time spent in the appointment was 25 minutes.     [name], MD 01/05/2020   I, Joslyn Devon, am acting as scribe for [name], MD.   I have reviewed the above documentation for accuracy and completeness, and I agree with the above.

## 2020-01-06 ENCOUNTER — Telehealth: Payer: Self-pay | Admitting: Hematology

## 2020-01-06 NOTE — Telephone Encounter (Signed)
Per 2/15 los, appts already scheduled.

## 2020-01-07 ENCOUNTER — Inpatient Hospital Stay: Payer: Medicare HMO

## 2020-01-07 ENCOUNTER — Other Ambulatory Visit: Payer: Self-pay

## 2020-01-07 VITALS — BP 149/64 | HR 95 | Temp 97.1°F | Resp 18

## 2020-01-07 DIAGNOSIS — C221 Intrahepatic bile duct carcinoma: Secondary | ICD-10-CM

## 2020-01-07 DIAGNOSIS — Z7189 Other specified counseling: Secondary | ICD-10-CM

## 2020-01-07 DIAGNOSIS — Z5111 Encounter for antineoplastic chemotherapy: Secondary | ICD-10-CM | POA: Diagnosis not present

## 2020-01-07 DIAGNOSIS — Z5189 Encounter for other specified aftercare: Secondary | ICD-10-CM | POA: Diagnosis not present

## 2020-01-07 MED ORDER — PEGFILGRASTIM-JMDB 6 MG/0.6ML ~~LOC~~ SOSY
6.0000 mg | PREFILLED_SYRINGE | Freq: Once | SUBCUTANEOUS | Status: AC
  Administered 2020-01-07: 09:00:00 6 mg via SUBCUTANEOUS

## 2020-01-07 MED ORDER — PEGFILGRASTIM-JMDB 6 MG/0.6ML ~~LOC~~ SOSY
PREFILLED_SYRINGE | SUBCUTANEOUS | Status: AC
  Filled 2020-01-07: qty 0.6

## 2020-01-07 NOTE — Patient Instructions (Signed)

## 2020-01-12 DIAGNOSIS — N401 Enlarged prostate with lower urinary tract symptoms: Secondary | ICD-10-CM | POA: Diagnosis not present

## 2020-01-12 DIAGNOSIS — R972 Elevated prostate specific antigen [PSA]: Secondary | ICD-10-CM | POA: Diagnosis not present

## 2020-01-15 NOTE — Progress Notes (Signed)
[place]   [phone] [phone]   Clinic Follow up Note   Patient Care Team: [name], PA as PCP - General (Family Medicine) [name], MD as Consulting Physician (General Surgery) [name], [name], MD as Consulting Physician (Gastroenterology) [name], MD as Consulting Physician (Hematology)  Date of Service:  01/19/2020  CHIEF COMPLAINT: F/u of liver cancer  SUMMARY OF ONCOLOGIC HISTORY: Oncology History Overview Note  Cancer Staging Intrahepatic cholangiocarcinoma ([place]) Staging form: Intrahepatic Bile Duct, AJCC 8th Edition - Clinical stage from 08/19/2019: Stage II (cT2, cN0, cM0) - Signed by [name], MD on 08/19/2019    Intrahepatic cholangiocarcinoma ([place])  06/28/2019 Imaging   CT AP W Contrast 06/28/19  IMPRESSION: 1. Heterogeneous hypodensity posteriorly in the right hepatic lobe and potentially extending into the caudate lobe suspicious for a mass. There is felt to be truncation of branches of the portal vein in this vicinity and some narrowing of the hepatic vein, as well as triangular-shaped regions of abnormal hypoenhancement posteriorly in the right hepatic lobe likely representing downstream vascular effects. Cannot exclude malignancy such as cholangiocarcinoma or hepatocellular carcinoma, and follow up hepatic protocol MRI with and without contrast is recommended to further characterize. 2. 4 mm right middle lobe pulmonary nodule is likely benign but may merit surveillance. 3. Cholelithiasis. 4.  Aortic Atherosclerosis (ICD10-I70.0). 5. Prostatomegaly. 6. Mild impingement at L3-4 and L4-5.   07/31/2019 Imaging   MRI Liver 07/31/19 IMPRESSION: 1. 7.3 cm in long axis mass in the right hepatic lobe spanning into the caudate lobe, high suspicion for malignancy such as hepatocellular carcinoma or cholangiocarcinoma. Suspected effacement or occlusion of the right hepatic vein and posterior branches of  the right portal vein. Two smaller tumor nodules along the posterior periphery of the dominant mass. Tissue diagnosis is recommended. 2. No findings of pathologic adenopathy or distant metastatic spread. 3. 9 mm gallstone in the gallbladder. There is mild gallbladder wall thickening which may be from nondistention, correlate clinically in assessing for cholecystitis. 4.  Aortic Atherosclerosis (ICD10-I70.0). 5. Mild diffuse hepatic steatosis.   08/11/2019 Initial Biopsy   DIAGNOSIS: 08/11/19  A. LIVER, RIGHT, BIOPSY:  - Adenocarcinoma.   08/18/2019 Imaging   CT Chest 08/18/19  IMPRESSION: 1. Multiple pulmonary nodules largest at approximately 7 mm in the right lower lobe, nonspecific but concerning given findings in the liver. 2. No signs of definitive metastatic disease, also with mildly enlarged upper abdominal lymph nodes as discussed.   Aortic Atherosclerosis (ICD10-I70.0).   08/19/2019 Initial Diagnosis   Intrahepatic cholangiocarcinoma ([place])   08/19/2019 Cancer Staging   Staging form: Intrahepatic Bile Duct, AJCC 8th Edition - Clinical stage from 08/19/2019: Stage II (cT2, cN0, cM0) - Signed by [name], MD on 08/19/2019   09/29/2019 -  Chemotherapy   Cisplatin and Gemcitabine 2 weeks on/1 week off starting 09/29/19    10/09/2019 Imaging   CT AP IMPRESSION: 1. The dominant right hepatic lobe mass is minimally reduced in size compared to prior exams, currently measuring 6.2 by 5.3 cm, previously 6.2 by 5.6 cm. However, there is a new small hypodense lesion centrally in the right hepatic lobe which is suspicious for a new small focus of tumor. Accordingly this is an overall mixed appearance. 2. Continued hypoenhancement in the liver downstream of the tumor likely attributable to narrowing or occlusion of the right hepatic vein by the tumor. By virtue of its location the tumor wraps around the intrahepatic portion of the IVC. 3. 4 mm  right middle lobe pulmonary nodule, stable  compared to earliest available comparison of 07/18/2019. Surveillance of the patient's pulmonary nodules is recommended. 4. Other imaging findings of potential clinical significance: Coronary atherosclerosis. Cholelithiasis. Prominent stool throughout the colon favors constipation. Moderate prostatomegaly with heterogeneous enhancement of the prostate gland. Lumbar spondylosis and degenerative disc disease causing mild bilateral foraminal impingement at L3-4 and L4-5.   Aortic Atherosclerosis (ICD10-I70.0).   12/24/2019 Imaging   CT CAP W Contrast  IMPRESSION: 1. Right hepatic lobe mass and adjacent right hepatic lobe nodules appear grossly stable. No evidence of distant metastatic disease. 2. Continued stability of small pulmonary nodules. Recommend attention on follow-up. 3. Cholelithiasis. 4. Enlarged prostate. 5. Aortic atherosclerosis (ICD10-I70.0). Coronary artery calcification.      CURRENT THERAPY:  Cisplatin and Gemcitabine 2 weeks on/1 week off starting 09/29/19  INTERVAL HISTORY:  Brandon Sherman is here for a follow up and treatment. He presents to the clinic alone. He notes he is stable and doing well. He notes he has been eating adequately and gained more than 5 pounds. He notes LE edema from gout of knee. He notes he sits in recliner often. He denies LE pain. He notes he has enough energy to still work 1-2 times a week and be active at home. He denies abdominal pain but notes RUQ tightness. He denies nausea. He notes stable hearing.     REVIEW OF SYSTEMS:   Constitutional: Denies fevers, chills or abnormal weight loss Eyes: Denies blurriness of vision Ears, nose, mouth, throat, and face: Denies mucositis or sore throat Respiratory: Denies cough, dyspnea or wheezes Cardiovascular: Denies palpitation, chest discomfort (+) lower extremity swelling, gout of knees  Gastrointestinal:  Denies nausea, heartburn or change in bowel habits Skin: Denies abnormal skin  rashes Lymphatics: Denies new lymphadenopathy or easy bruising Neurological:Denies numbness, tingling or new weaknesses Behavioral/Psych: Mood is stable, no new changes  All other systems were reviewed with the patient and are negative.  MEDICAL HISTORY:  Past Medical History:  Diagnosis Date  . Arthritis   . Diabetes ([place])   . GERD (gastroesophageal reflux disease)   . Hyperlipidemia   . Hypertension   . Intrahepatic cholangiocarcinoma ([place])     SURGICAL HISTORY: Past Surgical History:  Procedure Laterality Date  . APPENDECTOMY  1980  . COLONOSCOPY      I have reviewed the social history and family history with the patient and they are unchanged from previous note.  ALLERGIES:  has No Known Allergies.  MEDICATIONS:  Current Outpatient Medications  Medication Sig Dispense Refill  . allopurinol (ZYLOPRIM) 100 MG tablet Take 100 mg by mouth daily.    Marland Kitchen amLODipine (NORVASC) 2.5 MG tablet Take 2.5 mg by mouth daily.    Marland Kitchen aspirin EC 81 MG tablet Take 81 mg by mouth daily.    . famotidine (PEPCID) 40 MG tablet Take 0.5 tablets (20 mg total) by mouth 2 (two) times daily. 30 tablet 5  . fenofibrate (TRICOR) 145 MG tablet Take 145 mg by mouth daily.    . finasteride (PROSCAR) 5 MG tablet Take 5 mg by mouth daily.    Marland Kitchen lisinopril (ZESTRIL) 40 MG tablet Take 40 mg by mouth daily.    . metFORMIN (GLUCOPHAGE) 500 MG tablet Take 500 mg by mouth 2 (two) times daily with a meal.     . ondansetron (ZOFRAN) 4 MG tablet Take 4 mg by mouth every 8 (eight) hours as needed for nausea or vomiting.    . ondansetron (ZOFRAN)  8 MG tablet Take 1 tablet (8 mg total) by mouth 2 (two) times daily as needed. Start on the third day after chemotherapy. 30 tablet 1  . pantoprazole (PROTONIX) 40 MG tablet TAKE 1 TABLET BY MOUTH TWICE A DAY 180 tablet 0  . pravastatin (PRAVACHOL) 40 MG tablet Take 40 mg by mouth daily.    . prochlorperazine (COMPAZINE) 10 MG tablet Take 1 tablet (10 mg total) by mouth every 6  (six) hours as needed (Nausea or vomiting). 30 tablet 1  . promethazine (PHENERGAN) 12.5 MG tablet TAKE 1 TABLET BY MOUTH EVERY 6 HOURS AS NEEDED FOR NAUSEA OR VOMITING. 20 tablet 1  . sucralfate (CARAFATE) 1 g tablet Take 1 tablet (1 g total) by mouth every 6 (six) hours as needed. 180 tablet 1  . tamsulosin (FLOMAX) 0.4 MG CAPS capsule Take 0.4 mg by mouth daily.    . Vitamin D, Ergocalciferol, (DRISDOL) 1.25 MG (50000 UT) CAPS capsule Take 50,000 Units by mouth every 7 (seven) days.     No current facility-administered medications for this visit.    PHYSICAL EXAMINATION: ECOG PERFORMANCE STATUS: 0 - Asymptomatic  Vitals:   01/19/20 0834  BP: (!) 156/87  Pulse: 82  Resp: 18  Temp: 98.3 F (36.8 C)  SpO2: 99%   Filed Weights   01/19/20 0834  Weight: 238 lb 11.2 oz (108.3 kg)    GENERAL:alert, no distress and comfortable SKIN: skin color, texture, turgor are normal, no rashes or significant lesions EYES: normal, Conjunctiva are pink and non-injected, sclera clear  NECK: supple, thyroid normal size, non-tender, without nodularity LYMPH:  no palpable lymphadenopathy in the cervical, axillary  LUNGS: clear percussion with normal breathing effort (+) Mild crackling b/l  HEART: regular rate & rhythm and no murmurs (+) lower extremity edema ABDOMEN:abdomen soft, non-tender and normal bowel sounds Musculoskeletal:no cyanosis of digits and no clubbing  NEURO: alert & oriented x 3 with fluent speech, no focal motor/sensory deficits  LABORATORY DATA:  I have reviewed the data as listed CBC Latest Ref Rng & Units 01/19/2020 01/05/2020 12/29/2019  WBC 4.0 - 10.5 K/uL 16.4(H) 10.4 16.3(H)  Hemoglobin 13.0 - 17.0 g/dL 9.6(L) 10.1(L) 10.6(L)  Hematocrit 39.0 - 52.0 % 30.4(L) 30.2(L) 32.1(L)  Platelets 150 - 400 K/uL 196 288 237     CMP Latest Ref Rng & Units 01/19/2020 01/05/2020 12/29/2019  Glucose 70 - 99 mg/dL 113(H) 117(H) 115(H)  BUN 8 - 23 mg/dL 11 20 16   Creatinine 0.61 - 1.24 mg/dL  1.30(H) 1.23 1.13  Sodium 135 - 145 mmol/L 140 139 140  Potassium 3.5 - 5.1 mmol/L 4.5 4.3 3.9  Chloride 98 - 111 mmol/L 110 106 108  CO2 22 - 32 mmol/L 21(L) 22 24  Calcium 8.9 - 10.3 mg/dL 9.0 9.3 8.9  Total Protein 6.5 - 8.1 g/dL 6.5 6.5 6.5  Total Bilirubin 0.3 - 1.2 mg/dL 0.4 0.3 0.3  Alkaline Phos 38 - 126 U/L 86 53 77  AST 15 - 41 U/L 19 13(L) 18  ALT 0 - 44 U/L 14 16 19       RADIOGRAPHIC STUDIES: I have personally reviewed the radiological images as listed and agreed with the findings in the report. No results found.   ASSESSMENT & PLAN:  Brandon Sherman is a 74 y.o. male with    1.Intrahepatic cholangiocarcinoma, cT2N0Mx,unresectable,with indeterminate lung nodules -He was diagnosed in 07/2019.CT scans and MRI liver showa large7.3cm mass in the right hepatic lobe whichabutsportal vein.  -He was seen  byour local surgeon Dr. Barry Dienes andDr Carlis Abbott at [facility] concluded that cancer is not resectable due to the invasion to portal vein. -I discussed given his cancer is non-resectable his cancer is likely not curable but still treatable. Istarted him onstandardfirst line chemo with IV Cisplatin and Gemcitabine 2 weeks on/1 weekoff beginning 09/29/19. -His FO results showed MSI stable disease, IDH 1 mutation, so he may benefit from IDH inhibitor in the future. -He is clinically stable and continues to tolerate chemo well. Labs reviewed and adequate to proceed with C6 Gem/Cis today.  -due to his recent weight gain and leg edema, I will add lasix 1m after prechemo hydration  -F/u in 3 weeks   2. Nausea, Low food intakeand weight loss -With pain from GERD and nausea he initially lost 30 pounds. -His nausea and GERD is based on what he eats. Controlled onPhenergan, Sucralfate and Pepcid. -Hewaspreviouslyseen by Dietician. -No current nausea or vomiting on chemo and using less antiemetics.For his mild constipation he can continue OTC stool  softeners.His weight is stable as he is eating adequately. -He has no more nausea and able to gain weight.   3. DM, HTN, HLD, Gout, LE edema  -On Metformin, amlodipine, lisinopril., allopurinol  -Continue to f/u with his PCP  -Will monitor with treatment.We discussed that chemotherapy may impact his sugar level and blood pressure, I may adjust his medication if needed. -He does have LE edema which he attributes to Gout. I will add low dose IV lasix to infusions given crackling of lungs on exam today (01/19/20), he is agreeable.   4. Nicotine Use -He never smoked but has been chewing Tobacco for the past 60 years.He no longer drinks alcohol. -He now chews 1-2 times a day.Iagainencouraged him toreduce and quit completely.  5.GERDand gastritis, history ofesophageal candidiasis -Hehad repeated EGDwith Dr. AHavery Morosin 07/2019. 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  -Will monitor andmayrepeat EGDin future   PLAN: -Labs reviewed and adequate to proceed with C6D1Cis/Gem today, Fulphila injectionon day 9, will add lasix 111miv to chemo  -Lab andchemo Gem/Cis in 1, 3, 4 weeks  -F/u in 3 weeks   No problem-specific Assessment & Plan notes found for this encounter.   No orders of the defined types were placed in this encounter.  All questions were answered. The patient knows to call the clinic with any problems, questions or concerns. No barriers to learning was detected. The total time spent in the appointment was 30 minutes.     YaTruitt MerleMD 01/19/2020   I, AmJoslyn Devonam acting as scribe for YaTruitt MerleMD.   I have reviewed the above documentation for accuracy and completeness, and I agree with the above.

## 2020-01-19 ENCOUNTER — Other Ambulatory Visit: Payer: Self-pay

## 2020-01-19 ENCOUNTER — Inpatient Hospital Stay: Payer: Medicare HMO | Admitting: Hematology

## 2020-01-19 ENCOUNTER — Telehealth: Payer: Self-pay | Admitting: Hematology

## 2020-01-19 ENCOUNTER — Other Ambulatory Visit: Payer: Medicare HMO

## 2020-01-19 ENCOUNTER — Encounter: Payer: Self-pay | Admitting: Hematology

## 2020-01-19 ENCOUNTER — Inpatient Hospital Stay: Payer: Medicare HMO

## 2020-01-19 ENCOUNTER — Inpatient Hospital Stay: Payer: Medicare HMO | Attending: Hematology

## 2020-01-19 VITALS — BP 156/87 | HR 82 | Temp 98.3°F | Resp 18 | Ht 69.0 in | Wt 238.7 lb

## 2020-01-19 DIAGNOSIS — C221 Intrahepatic bile duct carcinoma: Secondary | ICD-10-CM

## 2020-01-19 DIAGNOSIS — R600 Localized edema: Secondary | ICD-10-CM | POA: Insufficient documentation

## 2020-01-19 DIAGNOSIS — Z5111 Encounter for antineoplastic chemotherapy: Secondary | ICD-10-CM | POA: Diagnosis not present

## 2020-01-19 DIAGNOSIS — E119 Type 2 diabetes mellitus without complications: Secondary | ICD-10-CM | POA: Insufficient documentation

## 2020-01-19 DIAGNOSIS — I10 Essential (primary) hypertension: Secondary | ICD-10-CM | POA: Diagnosis not present

## 2020-01-19 DIAGNOSIS — Z5189 Encounter for other specified aftercare: Secondary | ICD-10-CM | POA: Insufficient documentation

## 2020-01-19 DIAGNOSIS — Z7189 Other specified counseling: Secondary | ICD-10-CM

## 2020-01-19 DIAGNOSIS — R635 Abnormal weight gain: Secondary | ICD-10-CM | POA: Diagnosis not present

## 2020-01-19 LAB — CBC WITH DIFFERENTIAL (CANCER CENTER ONLY)
Abs Immature Granulocytes: 0.55 10*3/uL — ABNORMAL HIGH (ref 0.00–0.07)
Basophils Absolute: 0 10*3/uL (ref 0.0–0.1)
Basophils Relative: 0 %
Eosinophils Absolute: 0.1 10*3/uL (ref 0.0–0.5)
Eosinophils Relative: 1 %
HCT: 30.4 % — ABNORMAL LOW (ref 39.0–52.0)
Hemoglobin: 9.6 g/dL — ABNORMAL LOW (ref 13.0–17.0)
Immature Granulocytes: 3 %
Lymphocytes Relative: 10 %
Lymphs Abs: 1.6 10*3/uL (ref 0.7–4.0)
MCH: 33.6 pg (ref 26.0–34.0)
MCHC: 31.6 g/dL (ref 30.0–36.0)
MCV: 106.3 fL — ABNORMAL HIGH (ref 80.0–100.0)
Monocytes Absolute: 1.3 10*3/uL — ABNORMAL HIGH (ref 0.1–1.0)
Monocytes Relative: 8 %
Neutro Abs: 12.8 10*3/uL — ABNORMAL HIGH (ref 1.7–7.7)
Neutrophils Relative %: 78 %
Platelet Count: 196 10*3/uL (ref 150–400)
RBC: 2.86 MIL/uL — ABNORMAL LOW (ref 4.22–5.81)
RDW: 18.9 % — ABNORMAL HIGH (ref 11.5–15.5)
WBC Count: 16.4 10*3/uL — ABNORMAL HIGH (ref 4.0–10.5)
nRBC: 0.3 % — ABNORMAL HIGH (ref 0.0–0.2)

## 2020-01-19 LAB — MAGNESIUM: Magnesium: 1.8 mg/dL (ref 1.7–2.4)

## 2020-01-19 LAB — CMP (CANCER CENTER ONLY)
ALT: 14 U/L (ref 0–44)
AST: 19 U/L (ref 15–41)
Albumin: 3.8 g/dL (ref 3.5–5.0)
Alkaline Phosphatase: 86 U/L (ref 38–126)
Anion gap: 9 (ref 5–15)
BUN: 11 mg/dL (ref 8–23)
CO2: 21 mmol/L — ABNORMAL LOW (ref 22–32)
Calcium: 9 mg/dL (ref 8.9–10.3)
Chloride: 110 mmol/L (ref 98–111)
Creatinine: 1.3 mg/dL — ABNORMAL HIGH (ref 0.61–1.24)
GFR, Est AFR Am: >60 mL/min (ref >60)
GFR, Estimated: 55 mL/min — ABNORMAL LOW (ref >60)
Glucose, Bld: 113 mg/dL — ABNORMAL HIGH (ref 70–99)
Potassium: 4.5 mmol/L (ref 3.5–5.1)
Sodium: 140 mmol/L (ref 135–145)
Total Bilirubin: 0.4 mg/dL (ref 0.3–1.2)
Total Protein: 6.5 g/dL (ref 6.5–8.1)

## 2020-01-19 MED ORDER — SODIUM CHLORIDE 0.9 % IV SOLN
Freq: Once | INTRAVENOUS | Status: AC
  Filled 2020-01-19: qty 250

## 2020-01-19 MED ORDER — FUROSEMIDE 10 MG/ML IJ SOLN
INTRAMUSCULAR | Status: AC
  Filled 2020-01-19: qty 2

## 2020-01-19 MED ORDER — FUROSEMIDE 10 MG/ML IJ SOLN
10.0000 mg | Freq: Once | INTRAMUSCULAR | Status: AC
  Administered 2020-01-19: 10 mg via INTRAVENOUS

## 2020-01-19 MED ORDER — PALONOSETRON HCL INJECTION 0.25 MG/5ML
INTRAVENOUS | Status: AC
  Filled 2020-01-19: qty 5

## 2020-01-19 MED ORDER — SODIUM CHLORIDE 0.9 % IV SOLN
25.0000 mg/m2 | Freq: Once | INTRAVENOUS | Status: AC
  Administered 2020-01-19: 54 mg via INTRAVENOUS
  Filled 2020-01-19: qty 54

## 2020-01-19 MED ORDER — SODIUM CHLORIDE 0.9 % IV SOLN
1000.0000 mg/m2 | Freq: Once | INTRAVENOUS | Status: AC
  Administered 2020-01-19: 2166 mg via INTRAVENOUS
  Filled 2020-01-19: qty 56.97

## 2020-01-19 MED ORDER — POTASSIUM CHLORIDE 2 MEQ/ML IV SOLN
Freq: Once | INTRAVENOUS | Status: AC
  Filled 2020-01-19: qty 10

## 2020-01-19 MED ORDER — SODIUM CHLORIDE 0.9 % IV SOLN
Freq: Once | INTRAVENOUS | Status: AC
  Filled 2020-01-19: qty 5

## 2020-01-19 MED ORDER — PALONOSETRON HCL INJECTION 0.25 MG/5ML
0.2500 mg | Freq: Once | INTRAVENOUS | Status: AC
  Administered 2020-01-19: 0.25 mg via INTRAVENOUS

## 2020-01-19 NOTE — Patient Instructions (Signed)
[facility] Discharge Instructions for Patients Receiving Chemotherapy  Today you received the following chemotherapy agents: gemzar, Cisplatin   To help prevent nausea and vomiting after your treatment, we encourage you to take your nausea medication as directed.    If you develop nausea and vomiting that is not controlled by your nausea medication, call the clinic.   BELOW ARE SYMPTOMS THAT SHOULD BE REPORTED IMMEDIATELY:  *FEVER GREATER THAN 100.5 F  *CHILLS WITH OR WITHOUT FEVER  NAUSEA AND VOMITING THAT IS NOT CONTROLLED WITH YOUR NAUSEA MEDICATION  *UNUSUAL SHORTNESS OF BREATH  *UNUSUAL BRUISING OR BLEEDING  TENDERNESS IN MOUTH AND THROAT WITH OR WITHOUT PRESENCE OF ULCERS  *URINARY PROBLEMS  *BOWEL PROBLEMS  UNUSUAL RASH Items with * indicate a potential emergency and should be followed up as soon as possible.  Feel free to call the clinic should you have any questions or concerns. The clinic phone number is [phone].  Please show the [facility] at check-in to the Emergency Department and triage nurse.

## 2020-01-19 NOTE — Telephone Encounter (Signed)
Scheduled appt per 3/1 sch msg. Left voicemail with appt details. Mailed reminder letter and calender.

## 2020-01-20 ENCOUNTER — Telehealth: Payer: Self-pay | Admitting: Hematology

## 2020-01-20 NOTE — Telephone Encounter (Signed)
No los per 3/1.

## 2020-01-26 ENCOUNTER — Inpatient Hospital Stay: Payer: Medicare HMO

## 2020-01-26 ENCOUNTER — Other Ambulatory Visit: Payer: Medicare HMO

## 2020-01-26 ENCOUNTER — Other Ambulatory Visit: Payer: Self-pay

## 2020-01-26 VITALS — BP 135/77 | HR 70 | Temp 98.7°F | Resp 17

## 2020-01-26 DIAGNOSIS — R635 Abnormal weight gain: Secondary | ICD-10-CM | POA: Diagnosis not present

## 2020-01-26 DIAGNOSIS — C221 Intrahepatic bile duct carcinoma: Secondary | ICD-10-CM

## 2020-01-26 DIAGNOSIS — Z5111 Encounter for antineoplastic chemotherapy: Secondary | ICD-10-CM | POA: Diagnosis not present

## 2020-01-26 DIAGNOSIS — Z7189 Other specified counseling: Secondary | ICD-10-CM

## 2020-01-26 DIAGNOSIS — Z5189 Encounter for other specified aftercare: Secondary | ICD-10-CM | POA: Diagnosis not present

## 2020-01-26 DIAGNOSIS — E119 Type 2 diabetes mellitus without complications: Secondary | ICD-10-CM | POA: Diagnosis not present

## 2020-01-26 DIAGNOSIS — I10 Essential (primary) hypertension: Secondary | ICD-10-CM | POA: Diagnosis not present

## 2020-01-26 DIAGNOSIS — R600 Localized edema: Secondary | ICD-10-CM | POA: Diagnosis not present

## 2020-01-26 LAB — CBC WITH DIFFERENTIAL (CANCER CENTER ONLY)
Abs Immature Granulocytes: 0.06 10*3/uL (ref 0.00–0.07)
Basophils Absolute: 0.1 10*3/uL (ref 0.0–0.1)
Basophils Relative: 1 %
Eosinophils Absolute: 0 10*3/uL (ref 0.0–0.5)
Eosinophils Relative: 1 %
HCT: 28 % — ABNORMAL LOW (ref 39.0–52.0)
Hemoglobin: 9.2 g/dL — ABNORMAL LOW (ref 13.0–17.0)
Immature Granulocytes: 1 %
Lymphocytes Relative: 22 %
Lymphs Abs: 1.2 10*3/uL (ref 0.7–4.0)
MCH: 34.3 pg — ABNORMAL HIGH (ref 26.0–34.0)
MCHC: 32.9 g/dL (ref 30.0–36.0)
MCV: 104.5 fL — ABNORMAL HIGH (ref 80.0–100.0)
Monocytes Absolute: 0.5 10*3/uL (ref 0.1–1.0)
Monocytes Relative: 10 %
Neutro Abs: 3.6 10*3/uL (ref 1.7–7.7)
Neutrophils Relative %: 65 %
Platelet Count: 202 10*3/uL (ref 150–400)
RBC: 2.68 MIL/uL — ABNORMAL LOW (ref 4.22–5.81)
RDW: 16.7 % — ABNORMAL HIGH (ref 11.5–15.5)
WBC Count: 5.5 10*3/uL (ref 4.0–10.5)
nRBC: 0 % (ref 0.0–0.2)

## 2020-01-26 LAB — CMP (CANCER CENTER ONLY)
ALT: 20 U/L (ref 0–44)
AST: 25 U/L (ref 15–41)
Albumin: 3.9 g/dL (ref 3.5–5.0)
Alkaline Phosphatase: 63 U/L (ref 38–126)
Anion gap: 12 (ref 5–15)
BUN: 19 mg/dL (ref 8–23)
CO2: 22 mmol/L (ref 22–32)
Calcium: 9.1 mg/dL (ref 8.9–10.3)
Chloride: 107 mmol/L (ref 98–111)
Creatinine: 1.19 mg/dL (ref 0.61–1.24)
GFR, Est AFR Am: >60 mL/min (ref >60)
GFR, Estimated: >60 mL/min (ref >60)
Glucose, Bld: 101 mg/dL — ABNORMAL HIGH (ref 70–99)
Potassium: 4.4 mmol/L (ref 3.5–5.1)
Sodium: 141 mmol/L (ref 135–145)
Total Bilirubin: 0.4 mg/dL (ref 0.3–1.2)
Total Protein: 6.7 g/dL (ref 6.5–8.1)

## 2020-01-26 LAB — MAGNESIUM: Magnesium: 1.8 mg/dL (ref 1.7–2.4)

## 2020-01-26 MED ORDER — PALONOSETRON HCL INJECTION 0.25 MG/5ML
0.2500 mg | Freq: Once | INTRAVENOUS | Status: AC
  Administered 2020-01-26: 0.25 mg via INTRAVENOUS

## 2020-01-26 MED ORDER — SODIUM CHLORIDE 0.9 % IV SOLN
25.0000 mg/m2 | Freq: Once | INTRAVENOUS | Status: AC
  Administered 2020-01-26: 54 mg via INTRAVENOUS
  Filled 2020-01-26: qty 54

## 2020-01-26 MED ORDER — FUROSEMIDE 10 MG/ML IJ SOLN
INTRAMUSCULAR | Status: AC
  Filled 2020-01-26: qty 2

## 2020-01-26 MED ORDER — SODIUM CHLORIDE 0.9 % IV SOLN
1000.0000 mg/m2 | Freq: Once | INTRAVENOUS | Status: AC
  Administered 2020-01-26: 2166 mg via INTRAVENOUS
  Filled 2020-01-26: qty 56.97

## 2020-01-26 MED ORDER — SODIUM CHLORIDE 0.9 % IV SOLN
Freq: Once | INTRAVENOUS | Status: AC
  Filled 2020-01-26: qty 250

## 2020-01-26 MED ORDER — PALONOSETRON HCL INJECTION 0.25 MG/5ML
INTRAVENOUS | Status: AC
  Filled 2020-01-26: qty 5

## 2020-01-26 MED ORDER — FUROSEMIDE 10 MG/ML IJ SOLN
10.0000 mg | Freq: Once | INTRAMUSCULAR | Status: AC
  Administered 2020-01-26: 10 mg via INTRAVENOUS

## 2020-01-26 MED ORDER — POTASSIUM CHLORIDE 2 MEQ/ML IV SOLN
Freq: Once | INTRAVENOUS | Status: AC
  Filled 2020-01-26: qty 10

## 2020-01-26 MED ORDER — SODIUM CHLORIDE 0.9 % IV SOLN
Freq: Once | INTRAVENOUS | Status: AC
  Filled 2020-01-26: qty 5

## 2020-01-26 NOTE — Patient Instructions (Signed)
[facility] Discharge Instructions for Patients Receiving Chemotherapy  Today you received the following chemotherapy agents Gemzar, Cisplatin  To help prevent nausea and vomiting after your treatment, we encourage you to take your nausea medication as directed   If you develop nausea and vomiting that is not controlled by your nausea medication, call the clinic.   BELOW ARE SYMPTOMS THAT SHOULD BE REPORTED IMMEDIATELY:  *FEVER GREATER THAN 100.5 F  *CHILLS WITH OR WITHOUT FEVER  NAUSEA AND VOMITING THAT IS NOT CONTROLLED WITH YOUR NAUSEA MEDICATION  *UNUSUAL SHORTNESS OF BREATH  *UNUSUAL BRUISING OR BLEEDING  TENDERNESS IN MOUTH AND THROAT WITH OR WITHOUT PRESENCE OF ULCERS  *URINARY PROBLEMS  *BOWEL PROBLEMS  UNUSUAL RASH Items with * indicate a potential emergency and should be followed up as soon as possible.  Feel free to call the clinic should you have any questions or concerns. The clinic phone number is [phone].  Please show the CHEMO ALERT CARD at check-in to the Emergency Department and triage nurse.   

## 2020-01-28 ENCOUNTER — Inpatient Hospital Stay: Payer: Medicare HMO

## 2020-01-28 ENCOUNTER — Other Ambulatory Visit: Payer: Self-pay

## 2020-01-28 VITALS — BP 154/73 | HR 77 | Temp 98.0°F | Resp 18

## 2020-01-28 DIAGNOSIS — C221 Intrahepatic bile duct carcinoma: Secondary | ICD-10-CM

## 2020-01-28 DIAGNOSIS — E119 Type 2 diabetes mellitus without complications: Secondary | ICD-10-CM | POA: Diagnosis not present

## 2020-01-28 DIAGNOSIS — R635 Abnormal weight gain: Secondary | ICD-10-CM | POA: Diagnosis not present

## 2020-01-28 DIAGNOSIS — Z5111 Encounter for antineoplastic chemotherapy: Secondary | ICD-10-CM | POA: Diagnosis not present

## 2020-01-28 DIAGNOSIS — R600 Localized edema: Secondary | ICD-10-CM | POA: Diagnosis not present

## 2020-01-28 DIAGNOSIS — I10 Essential (primary) hypertension: Secondary | ICD-10-CM | POA: Diagnosis not present

## 2020-01-28 DIAGNOSIS — Z5189 Encounter for other specified aftercare: Secondary | ICD-10-CM | POA: Diagnosis not present

## 2020-01-28 DIAGNOSIS — Z7189 Other specified counseling: Secondary | ICD-10-CM

## 2020-01-28 MED ORDER — PEGFILGRASTIM-JMDB 6 MG/0.6ML ~~LOC~~ SOSY
6.0000 mg | PREFILLED_SYRINGE | Freq: Once | SUBCUTANEOUS | Status: AC
  Administered 2020-01-28: 6 mg via SUBCUTANEOUS

## 2020-01-28 MED ORDER — PEGFILGRASTIM-JMDB 6 MG/0.6ML ~~LOC~~ SOSY
PREFILLED_SYRINGE | SUBCUTANEOUS | Status: AC
  Filled 2020-01-28: qty 0.6

## 2020-02-08 NOTE — Progress Notes (Addendum)
[place]   Telephone:(336) [phone] Fax:(336) [phone]   Clinic Follow up Note   Patient Care Team: [name], PA as PCP - General (Family Medicine) [name], MD as Consulting Physician (General Surgery) [name], [name], MD as Consulting Physician (Gastroenterology) [name], MD as Consulting Physician (Hematology) 02/09/2020  CHIEF COMPLAINT: F/u liver cancer   SUMMARY OF ONCOLOGIC HISTORY: Oncology History Overview Note  Cancer Staging Intrahepatic cholangiocarcinoma ([place]) Staging form: Intrahepatic Bile Duct, AJCC 8th Edition - Clinical stage from 08/19/2019: Stage II (cT2, cN0, cM0) - Signed by [name], MD on 08/19/2019    Intrahepatic cholangiocarcinoma ([place])  06/28/2019 Imaging   CT AP W Contrast 06/28/19  IMPRESSION: 1. Heterogeneous hypodensity posteriorly in the right hepatic lobe and potentially extending into the caudate lobe suspicious for a mass. There is felt to be truncation of branches of the portal vein in this vicinity and some narrowing of the hepatic vein, as well as triangular-shaped regions of abnormal hypoenhancement posteriorly in the right hepatic lobe likely representing downstream vascular effects. Cannot exclude malignancy such as cholangiocarcinoma or hepatocellular carcinoma, and follow up hepatic protocol MRI with and without contrast is recommended to further characterize. 2. 4 mm right middle lobe pulmonary nodule is likely benign but may merit surveillance. 3. Cholelithiasis. 4.  Aortic Atherosclerosis (ICD10-I70.0). 5. Prostatomegaly. 6. Mild impingement at L3-4 and L4-5.   07/31/2019 Imaging   MRI Liver 07/31/19 IMPRESSION: 1. 7.3 cm in long axis mass in the right hepatic lobe spanning into the caudate lobe, high suspicion for malignancy such as hepatocellular carcinoma or cholangiocarcinoma. Suspected effacement or occlusion of the right hepatic vein and posterior branches of the right portal vein. Two  smaller tumor nodules along the posterior periphery of the dominant mass. Tissue diagnosis is recommended. 2. No findings of pathologic adenopathy or distant metastatic spread. 3. 9 mm gallstone in the gallbladder. There is mild gallbladder wall thickening which may be from nondistention, correlate clinically in assessing for cholecystitis. 4.  Aortic Atherosclerosis (ICD10-I70.0). 5. Mild diffuse hepatic steatosis.   08/11/2019 Initial Biopsy   DIAGNOSIS: 08/11/19  A. LIVER, RIGHT, BIOPSY:  - Adenocarcinoma.   08/18/2019 Imaging   CT Chest 08/18/19  IMPRESSION: 1. Multiple pulmonary nodules largest at approximately 7 mm in the right lower lobe, nonspecific but concerning given findings in the liver. 2. No signs of definitive metastatic disease, also with mildly enlarged upper abdominal lymph nodes as discussed.   Aortic Atherosclerosis (ICD10-I70.0).   08/19/2019 Initial Diagnosis   Intrahepatic cholangiocarcinoma ([place])   08/19/2019 Cancer Staging   Staging form: Intrahepatic Bile Duct, AJCC 8th Edition - Clinical stage from 08/19/2019: Stage II (cT2, cN0, cM0) - Signed by [name], MD on 08/19/2019   09/29/2019 -  Chemotherapy   Cisplatin and Gemcitabine 2 weeks on/1 week off starting 09/29/19    10/09/2019 Imaging   CT AP IMPRESSION: 1. The dominant right hepatic lobe mass is minimally reduced in size compared to prior exams, currently measuring 6.2 by 5.3 cm, previously 6.2 by 5.6 cm. However, there is a new small hypodense lesion centrally in the right hepatic lobe which is suspicious for a new small focus of tumor. Accordingly this is an overall mixed appearance. 2. Continued hypoenhancement in the liver downstream of the tumor likely attributable to narrowing or occlusion of the right hepatic vein by the tumor. By virtue of its location the tumor wraps around the intrahepatic portion of the IVC. 3. 4 mm right middle lobe pulmonary nodule,  stable compared to earliest available  comparison of 07/18/2019. Surveillance of the patient's pulmonary nodules is recommended. 4. Other imaging findings of potential clinical significance: Coronary atherosclerosis. Cholelithiasis. Prominent stool throughout the colon favors constipation. Moderate prostatomegaly with heterogeneous enhancement of the prostate gland. Lumbar spondylosis and degenerative disc disease causing mild bilateral foraminal impingement at L3-4 and L4-5.   Aortic Atherosclerosis (ICD10-I70.0).   12/24/2019 Imaging   CT CAP W Contrast  IMPRESSION: 1. Right hepatic lobe mass and adjacent right hepatic lobe nodules appear grossly stable. No evidence of distant metastatic disease. 2. Continued stability of small pulmonary nodules. Recommend attention on follow-up. 3. Cholelithiasis. 4. Enlarged prostate. 5. Aortic atherosclerosis (ICD10-I70.0). Coronary artery calcification.     CURRENT THERAPY: Cisplatin and Gemcitabine 2 weeks on/1 week off starting 09/29/19  INTERVAL HISTORY: [name] returns for f/u and treatment as scheduled. He completed cycle 8 with Udenyca on 01/26/20. He is doing well, no complaints after treatment. Appetite and energy are at baseline, able to be out of bed and active at home. He assures me he has no trouble with appetite or hydration. Denies mucositis. Takes prophylactic anti-emetics, no n/v/c/d. Baseline epigastric "tightness" is stable. Takes carafate and PPI. Rates discomfort 1/10 today. Denies dysuria or hematuria. Denies neuropathy. Denies bone pain from injection. Denies recent fever, chills, cough, chest pain, dyspnea or increased leg edema.    MEDICAL HISTORY:  Past Medical History:  Diagnosis Date  . Arthritis   . Diabetes ([place])   . GERD (gastroesophageal reflux disease)   . Hyperlipidemia   . Hypertension   . Intrahepatic cholangiocarcinoma ([place])     SURGICAL HISTORY: Past Surgical History:  Procedure Laterality Date  . APPENDECTOMY  1980  . COLONOSCOPY       I have reviewed the social history and family history with the patient and they are unchanged from previous note.  ALLERGIES:  has No Known Allergies.  MEDICATIONS:  Current Outpatient Medications  Medication Sig Dispense Refill  . allopurinol (ZYLOPRIM) 100 MG tablet Take 100 mg by mouth daily.    Marland Kitchen amLODipine (NORVASC) 2.5 MG tablet Take 2.5 mg by mouth daily.    Marland Kitchen aspirin EC 81 MG tablet Take 81 mg by mouth daily.    . famotidine (PEPCID) 40 MG tablet Take 0.5 tablets (20 mg total) by mouth 2 (two) times daily. 30 tablet 5  . fenofibrate (TRICOR) 145 MG tablet Take 145 mg by mouth daily.    . finasteride (PROSCAR) 5 MG tablet Take 5 mg by mouth daily.    Marland Kitchen lisinopril (ZESTRIL) 40 MG tablet Take 40 mg by mouth daily.    . metFORMIN (GLUCOPHAGE) 500 MG tablet Take 500 mg by mouth 2 (two) times daily with a meal.     . ondansetron (ZOFRAN) 4 MG tablet Take 4 mg by mouth every 8 (eight) hours as needed for nausea or vomiting.    . ondansetron (ZOFRAN) 8 MG tablet Take 1 tablet (8 mg total) by mouth 2 (two) times daily as needed. Start on the third day after chemotherapy. 30 tablet 1  . pantoprazole (PROTONIX) 40 MG tablet TAKE 1 TABLET BY MOUTH TWICE A DAY 180 tablet 0  . pravastatin (PRAVACHOL) 40 MG tablet Take 40 mg by mouth daily.    . prochlorperazine (COMPAZINE) 10 MG tablet Take 1 tablet (10 mg total) by mouth every 6 (six) hours as needed (Nausea or vomiting). 30 tablet 1  . promethazine (PHENERGAN) 12.5 MG tablet TAKE 1 TABLET BY MOUTH EVERY  6 HOURS AS NEEDED FOR NAUSEA OR VOMITING. 20 tablet 1  . sucralfate (CARAFATE) 1 g tablet Take 1 tablet (1 g total) by mouth every 6 (six) hours as needed. 180 tablet 1  . tamsulosin (FLOMAX) 0.4 MG CAPS capsule Take 0.4 mg by mouth daily.    . Vitamin D, Ergocalciferol, (DRISDOL) 1.25 MG (50000 UT) CAPS capsule Take 50,000 Units by mouth every 7 (seven) days.    . magnesium oxide (MAG-OX) 400 (241.3 Mg) MG tablet Take 1 tablet (400 mg  total) by mouth daily. 30 tablet 0   No current facility-administered medications for this visit.   Facility-Administered Medications Ordered in Other Visits  Medication Dose Route Frequency Provider Last Rate Last Admin  . CISplatin (PLATINOL) 54 mg in sodium chloride 0.9 % 250 mL chemo infusion  25 mg/m2 (Treatment Plan Recorded) Intravenous Once [name], MD      . fosaprepitant (EMEND) 150 mg, dexamethasone (DECADRON) 12 mg in sodium chloride 0.9 % 145 mL IVPB   Intravenous Once [name], MD      . gemcitabine (GEMZAR) 2,166 mg in sodium chloride 0.9 % 250 mL chemo infusion  1,000 mg/m2 (Treatment Plan Recorded) Intravenous Once [name], MD        PHYSICAL EXAMINATION: ECOG PERFORMANCE STATUS: 0 - Asymptomatic  Vitals:   02/09/20 0854  BP: (!) 144/85  Pulse: 99  Resp: 20  Temp: 98.2 F (36.8 C)  SpO2: 100%   Filed Weights   02/09/20 0854  Weight: 230 lb 6.4 oz (104.5 kg)    GENERAL:alert, no distress and comfortable SKIN: no rash  EYES: sclera clear OROPHARYNX: no thrush or ulcers NECK: without mass LUNGS: clear with normal breathing effort HEART: regular rate & rhythm, trace bilateral lower extremity edema ABDOMEN: abdomen soft, non-tender and normal bowel sounds NEURO: alert & oriented x 3 with fluent speech, normal gait   LABORATORY DATA:  I have reviewed the data as listed CBC Latest Ref Rng & Units 02/09/2020 01/26/2020 01/19/2020  WBC 4.0 - 10.5 K/uL 13.8(H) 5.5 16.4(H)  Hemoglobin 13.0 - 17.0 g/dL 9.7(L) 9.2(L) 9.6(L)  Hematocrit 39.0 - 52.0 % 29.8(L) 28.0(L) 30.4(L)  Platelets 150 - 400 K/uL 201 202 196     CMP Latest Ref Rng & Units 02/09/2020 01/26/2020 01/19/2020  Glucose 70 - 99 mg/dL 100(H) 101(H) 113(H)  BUN 8 - 23 mg/dL 16 19 11   Creatinine 0.61 - 1.24 mg/dL 1.31(H) 1.19 1.30(H)  Sodium 135 - 145 mmol/L 143 141 140  Potassium 3.5 - 5.1 mmol/L 4.1 4.4 4.5  Chloride 98 - 111 mmol/L 112(H) 107 110  CO2 22 - 32 mmol/L 22 22 21(L)  Calcium 8.9 - 10.3  mg/dL 8.9 9.1 9.0  Total Protein 6.5 - 8.1 g/dL 6.3(L) 6.7 6.5  Total Bilirubin 0.3 - 1.2 mg/dL 0.4 0.4 0.4  Alkaline Phos 38 - 126 U/L 83 63 86  AST 15 - 41 U/L 14(L) 25 19  ALT 0 - 44 U/L 12 20 14       RADIOGRAPHIC STUDIES: I have personally reviewed the radiological images as listed and agreed with the findings in the report. No results found.   ASSESSMENT & PLAN: Issaih Flippen Sizemoreis a 74 y.o.malewith   1.Intrahepatic cholangiocarcinoma, cT2N0Mx,unresectable,with indeterminate lung nodules -Diagnosed in 07/2019.CT scans and MRI liver showa large7.3cm mass in the right hepatic lobe whichabutsportal vein. Both our local surgeon Dr. Barry Dienes andDr Carlis Abbott at [facility] concluded that cancer is not resectable, and therefore not likely curable, due to  the invasion to portal vein. -He began palliative systemic treatment standardfirst line chemo with IV Cisplatin and Gemcitabine 2 weeks on/1 week off.Started 09/29/19, tolerating well -Restaging CT CAP on 12/24/19 was stable, continues current treatment   2. Nausea, Low food intakeand weight loss -With pain from GERD and nausea he initially lost 30 pounds. -followed by dietician -weight fluctuates on treatment, some component of fluid retention in lower extremities -monitoring   3. DM, HTN, HLD, Gout  -On Metformin, amlodipine, lisinopril., allopurinol  -Continue to f/u with his PCP -BG 100 today, BP 144/85, stable  4. Nicotine Use -He never smoked but has been chewing Tobacco for the past 60 years.He no longer drinks alcohol and tells me he has quit smoking (11/25/19).  5.GERDand gastritis, history ofesophageal candidiasis -Hehad repeated EGDwith Dr. Havery Moros in 07/2019. 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 Mayrepeat EGDin future -GERD resolved on PPI and carafate -he does note stable epigastric tightness     Disposition:  Mr. Kellas appears stable. He completed 6 cycles of cisplatin and gemcitabine. He tolerates treatment very well overall, without significant toxicities. He has difficult IV access, requiring multiple sticks and IV team recently. He is interested in [facility] placement. I discussed this briefly, he agrees to IR referral for central line placement.   Labs reviewed, CBC shows leukocytosis likely from [place]. Anemia is stable. On CMP Cr. fluctuates, 1.3, will hold lasix from his treatment today. He was encouraged to remain well hydrated. Mg 1.6, I have started him on oral mag once daily. Overall labs adequate to proceed with cycle 7 day 1 gemcitabine and cisplatin without dosage adjustments, I reviewed with pharmacy. He will return for day 8 next week, then injection on 3/31. F/u in 3 weeks with next cycle.   Orders Placed This Encounter  Procedures  . IR Fluoro Guide CV Line Right    Port a cath Patient currently on chemotherapy 3/22 and due 3/29, then GCSF 3/31, consider scheduling week of 4/5 - 4/9 while recovering from chemo. Next chemo 4/12. Thanks    Standing Status:   Future    Standing Expiration Date:   04/10/2021    Order Specific Question:   Reason for exam:    Answer:   central access for chemotherapy    Order Specific Question:   Preferred Imaging Location?    Answer:   [facility]   All questions were answered. The patient knows to call the clinic with any problems, questions or concerns. No barriers to learning were detected.     Alla Feeling, NP 02/09/20

## 2020-02-09 ENCOUNTER — Encounter: Payer: Self-pay | Admitting: Nurse Practitioner

## 2020-02-09 ENCOUNTER — Inpatient Hospital Stay: Payer: Medicare HMO

## 2020-02-09 ENCOUNTER — Inpatient Hospital Stay: Payer: Medicare HMO | Admitting: Nurse Practitioner

## 2020-02-09 ENCOUNTER — Other Ambulatory Visit: Payer: Medicare HMO

## 2020-02-09 ENCOUNTER — Other Ambulatory Visit: Payer: Self-pay

## 2020-02-09 VITALS — BP 144/85 | HR 99 | Temp 98.2°F | Resp 20 | Ht 69.0 in | Wt 230.4 lb

## 2020-02-09 DIAGNOSIS — R635 Abnormal weight gain: Secondary | ICD-10-CM | POA: Diagnosis not present

## 2020-02-09 DIAGNOSIS — C221 Intrahepatic bile duct carcinoma: Secondary | ICD-10-CM

## 2020-02-09 DIAGNOSIS — R600 Localized edema: Secondary | ICD-10-CM | POA: Diagnosis not present

## 2020-02-09 DIAGNOSIS — Z5111 Encounter for antineoplastic chemotherapy: Secondary | ICD-10-CM | POA: Diagnosis not present

## 2020-02-09 DIAGNOSIS — Z5189 Encounter for other specified aftercare: Secondary | ICD-10-CM | POA: Diagnosis not present

## 2020-02-09 DIAGNOSIS — I10 Essential (primary) hypertension: Secondary | ICD-10-CM | POA: Diagnosis not present

## 2020-02-09 DIAGNOSIS — Z7189 Other specified counseling: Secondary | ICD-10-CM

## 2020-02-09 DIAGNOSIS — E119 Type 2 diabetes mellitus without complications: Secondary | ICD-10-CM | POA: Diagnosis not present

## 2020-02-09 LAB — CMP (CANCER CENTER ONLY)
ALT: 12 U/L (ref 0–44)
AST: 14 U/L — ABNORMAL LOW (ref 15–41)
Albumin: 3.8 g/dL (ref 3.5–5.0)
Alkaline Phosphatase: 83 U/L (ref 38–126)
Anion gap: 9 (ref 5–15)
BUN: 16 mg/dL (ref 8–23)
CO2: 22 mmol/L (ref 22–32)
Calcium: 8.9 mg/dL (ref 8.9–10.3)
Chloride: 112 mmol/L — ABNORMAL HIGH (ref 98–111)
Creatinine: 1.31 mg/dL — ABNORMAL HIGH (ref 0.61–1.24)
GFR, Est AFR Am: >60 mL/min (ref >60)
GFR, Estimated: 54 mL/min — ABNORMAL LOW (ref >60)
Glucose, Bld: 100 mg/dL — ABNORMAL HIGH (ref 70–99)
Potassium: 4.1 mmol/L (ref 3.5–5.1)
Sodium: 143 mmol/L (ref 135–145)
Total Bilirubin: 0.4 mg/dL (ref 0.3–1.2)
Total Protein: 6.3 g/dL — ABNORMAL LOW (ref 6.5–8.1)

## 2020-02-09 LAB — CBC WITH DIFFERENTIAL (CANCER CENTER ONLY)
Abs Immature Granulocytes: 0.15 10*3/uL — ABNORMAL HIGH (ref 0.00–0.07)
Basophils Absolute: 0.1 10*3/uL (ref 0.0–0.1)
Basophils Relative: 0 %
Eosinophils Absolute: 0.1 10*3/uL (ref 0.0–0.5)
Eosinophils Relative: 1 %
HCT: 29.8 % — ABNORMAL LOW (ref 39.0–52.0)
Hemoglobin: 9.7 g/dL — ABNORMAL LOW (ref 13.0–17.0)
Immature Granulocytes: 1 %
Lymphocytes Relative: 11 %
Lymphs Abs: 1.5 10*3/uL (ref 0.7–4.0)
MCH: 34.9 pg — ABNORMAL HIGH (ref 26.0–34.0)
MCHC: 32.6 g/dL (ref 30.0–36.0)
MCV: 107.2 fL — ABNORMAL HIGH (ref 80.0–100.0)
Monocytes Absolute: 1 10*3/uL (ref 0.1–1.0)
Monocytes Relative: 7 %
Neutro Abs: 11 10*3/uL — ABNORMAL HIGH (ref 1.7–7.7)
Neutrophils Relative %: 80 %
Platelet Count: 201 10*3/uL (ref 150–400)
RBC: 2.78 MIL/uL — ABNORMAL LOW (ref 4.22–5.81)
RDW: 18.1 % — ABNORMAL HIGH (ref 11.5–15.5)
WBC Count: 13.8 10*3/uL — ABNORMAL HIGH (ref 4.0–10.5)
nRBC: 0.1 % (ref 0.0–0.2)

## 2020-02-09 LAB — MAGNESIUM: Magnesium: 1.6 mg/dL — ABNORMAL LOW (ref 1.7–2.4)

## 2020-02-09 MED ORDER — SODIUM CHLORIDE 0.9 % IV SOLN
1000.0000 mg/m2 | Freq: Once | INTRAVENOUS | Status: AC
  Administered 2020-02-09: 2166 mg via INTRAVENOUS
  Filled 2020-02-09: qty 56.97

## 2020-02-09 MED ORDER — SODIUM CHLORIDE 0.9 % IV SOLN
25.0000 mg/m2 | Freq: Once | INTRAVENOUS | Status: AC
  Administered 2020-02-09: 54 mg via INTRAVENOUS
  Filled 2020-02-09: qty 54

## 2020-02-09 MED ORDER — PALONOSETRON HCL INJECTION 0.25 MG/5ML
0.2500 mg | Freq: Once | INTRAVENOUS | Status: AC
  Administered 2020-02-09: 0.25 mg via INTRAVENOUS

## 2020-02-09 MED ORDER — MAGNESIUM OXIDE 400 (241.3 MG) MG PO TABS: 400.0000 mg | ORAL_TABLET | Freq: Every day | ORAL | 0 refills | Status: DC

## 2020-02-09 MED ORDER — SODIUM CHLORIDE 0.9 % IV SOLN
Freq: Once | INTRAVENOUS | Status: AC
  Filled 2020-02-09: qty 5

## 2020-02-09 MED ORDER — POTASSIUM CHLORIDE 2 MEQ/ML IV SOLN
Freq: Once | INTRAVENOUS | Status: AC
  Filled 2020-02-09: qty 10

## 2020-02-09 MED ORDER — SODIUM CHLORIDE 0.9 % IV SOLN
Freq: Once | INTRAVENOUS | Status: AC
  Filled 2020-02-09: qty 250

## 2020-02-09 MED ORDER — PALONOSETRON HCL INJECTION 0.25 MG/5ML
INTRAVENOUS | Status: AC
  Filled 2020-02-09: qty 5

## 2020-02-09 NOTE — Patient Instructions (Signed)
[facility] Discharge Instructions for Patients Receiving Chemotherapy  Today you received the following chemotherapy agents Gemzar, Cisplatin  To help prevent nausea and vomiting after your treatment, we encourage you to take your nausea medication as directed   If you develop nausea and vomiting that is not controlled by your nausea medication, call the clinic.   BELOW ARE SYMPTOMS THAT SHOULD BE REPORTED IMMEDIATELY:  *FEVER GREATER THAN 100.5 F  *CHILLS WITH OR WITHOUT FEVER  NAUSEA AND VOMITING THAT IS NOT CONTROLLED WITH YOUR NAUSEA MEDICATION  *UNUSUAL SHORTNESS OF BREATH  *UNUSUAL BRUISING OR BLEEDING  TENDERNESS IN MOUTH AND THROAT WITH OR WITHOUT PRESENCE OF ULCERS  *URINARY PROBLEMS  *BOWEL PROBLEMS  UNUSUAL RASH Items with * indicate a potential emergency and should be followed up as soon as possible.  Feel free to call the clinic should you have any questions or concerns. The clinic phone number is [phone].  Please show the CHEMO ALERT CARD at check-in to the Emergency Department and triage nurse.   

## 2020-02-09 NOTE — Progress Notes (Signed)
Per Brandon Rue, NP, ok to run post hydration fluids with cisplatin.

## 2020-02-09 NOTE — Progress Notes (Signed)
VAST consulted to obtain IV access. Pt's right arm assessed utilizing ultrasound. Most visualized veins are hardened and inappropriate to cannulate. Able to place superficial 22g with use of [place], but had difficulty. Advised pt to speak to his physician about port placement if treatments will continue.  This RN did not assess left arm utilizing [place]. Advised pt to have nurses assess left arm next time [place] is used.

## 2020-02-09 NOTE — Progress Notes (Signed)
Okay to treat today per Lacie. She has reviewed labs (including SCr). She has removed his furosemide from treatment today and patient will initiate oral magnesium.  Demetrius Charity, PharmD, BCPS, [place] [facility] Pharmacist Pharmacy Phone: [phone] 02/09/2020

## 2020-02-09 NOTE — Addendum Note (Signed)
Addended by: Alla Feeling on: 02/09/2020 01:13 PM   Modules accepted: Orders

## 2020-02-10 ENCOUNTER — Telehealth: Payer: Self-pay | Admitting: Nurse Practitioner

## 2020-02-10 NOTE — Telephone Encounter (Signed)
Scheduled appt per 3/22 los.

## 2020-02-16 ENCOUNTER — Other Ambulatory Visit: Payer: Self-pay

## 2020-02-16 ENCOUNTER — Other Ambulatory Visit: Payer: Medicare HMO

## 2020-02-16 ENCOUNTER — Inpatient Hospital Stay: Payer: Medicare HMO

## 2020-02-16 VITALS — BP 146/76 | HR 80 | Temp 98.7°F | Resp 18 | Wt 229.5 lb

## 2020-02-16 DIAGNOSIS — E119 Type 2 diabetes mellitus without complications: Secondary | ICD-10-CM | POA: Diagnosis not present

## 2020-02-16 DIAGNOSIS — I10 Essential (primary) hypertension: Secondary | ICD-10-CM | POA: Diagnosis not present

## 2020-02-16 DIAGNOSIS — C221 Intrahepatic bile duct carcinoma: Secondary | ICD-10-CM

## 2020-02-16 DIAGNOSIS — R600 Localized edema: Secondary | ICD-10-CM | POA: Diagnosis not present

## 2020-02-16 DIAGNOSIS — R635 Abnormal weight gain: Secondary | ICD-10-CM | POA: Diagnosis not present

## 2020-02-16 DIAGNOSIS — Z7189 Other specified counseling: Secondary | ICD-10-CM

## 2020-02-16 DIAGNOSIS — Z5189 Encounter for other specified aftercare: Secondary | ICD-10-CM | POA: Diagnosis not present

## 2020-02-16 DIAGNOSIS — Z5111 Encounter for antineoplastic chemotherapy: Secondary | ICD-10-CM | POA: Diagnosis not present

## 2020-02-16 LAB — CBC WITH DIFFERENTIAL (CANCER CENTER ONLY)
Abs Immature Granulocytes: 0.11 10*3/uL — ABNORMAL HIGH (ref 0.00–0.07)
Basophils Absolute: 0.1 10*3/uL (ref 0.0–0.1)
Basophils Relative: 1 %
Eosinophils Absolute: 0 10*3/uL (ref 0.0–0.5)
Eosinophils Relative: 1 %
HCT: 26.8 % — ABNORMAL LOW (ref 39.0–52.0)
Hemoglobin: 8.9 g/dL — ABNORMAL LOW (ref 13.0–17.0)
Immature Granulocytes: 1 %
Lymphocytes Relative: 16 %
Lymphs Abs: 1.3 10*3/uL (ref 0.7–4.0)
MCH: 34.2 pg — ABNORMAL HIGH (ref 26.0–34.0)
MCHC: 33.2 g/dL (ref 30.0–36.0)
MCV: 103.1 fL — ABNORMAL HIGH (ref 80.0–100.0)
Monocytes Absolute: 0.5 10*3/uL (ref 0.1–1.0)
Monocytes Relative: 7 %
Neutro Abs: 6.1 10*3/uL (ref 1.7–7.7)
Neutrophils Relative %: 74 %
Platelet Count: 185 10*3/uL (ref 150–400)
RBC: 2.6 MIL/uL — ABNORMAL LOW (ref 4.22–5.81)
RDW: 16.8 % — ABNORMAL HIGH (ref 11.5–15.5)
WBC Count: 8.1 10*3/uL (ref 4.0–10.5)
nRBC: 0 % (ref 0.0–0.2)

## 2020-02-16 LAB — CMP (CANCER CENTER ONLY)
ALT: 16 U/L (ref 0–44)
AST: 17 U/L (ref 15–41)
Albumin: 3.7 g/dL (ref 3.5–5.0)
Alkaline Phosphatase: 62 U/L (ref 38–126)
Anion gap: 10 (ref 5–15)
BUN: 19 mg/dL (ref 8–23)
CO2: 22 mmol/L (ref 22–32)
Calcium: 9.1 mg/dL (ref 8.9–10.3)
Chloride: 109 mmol/L (ref 98–111)
Creatinine: 1.33 mg/dL — ABNORMAL HIGH (ref 0.61–1.24)
GFR, Est AFR Am: >60 mL/min (ref >60)
GFR, Estimated: 53 mL/min — ABNORMAL LOW (ref >60)
Glucose, Bld: 95 mg/dL (ref 70–99)
Potassium: 4.2 mmol/L (ref 3.5–5.1)
Sodium: 141 mmol/L (ref 135–145)
Total Bilirubin: 0.4 mg/dL (ref 0.3–1.2)
Total Protein: 6.4 g/dL — ABNORMAL LOW (ref 6.5–8.1)

## 2020-02-16 MED ORDER — SODIUM CHLORIDE 0.9 % IV SOLN
Freq: Once | INTRAVENOUS | Status: DC
  Filled 2020-02-16: qty 250

## 2020-02-16 MED ORDER — SODIUM CHLORIDE 0.9 % IV SOLN
Freq: Once | INTRAVENOUS | Status: AC
  Filled 2020-02-16: qty 5

## 2020-02-16 MED ORDER — PALONOSETRON HCL INJECTION 0.25 MG/5ML
INTRAVENOUS | Status: AC
  Filled 2020-02-16: qty 5

## 2020-02-16 MED ORDER — SODIUM CHLORIDE 0.9 % IV SOLN
Freq: Once | INTRAVENOUS | Status: AC
  Filled 2020-02-16: qty 250

## 2020-02-16 MED ORDER — FUROSEMIDE 10 MG/ML IJ SOLN
INTRAMUSCULAR | Status: AC
  Filled 2020-02-16: qty 2

## 2020-02-16 MED ORDER — SODIUM CHLORIDE 0.9 % IV SOLN
1000.0000 mg/m2 | Freq: Once | INTRAVENOUS | Status: AC
  Administered 2020-02-16: 2166 mg via INTRAVENOUS
  Filled 2020-02-16: qty 56.97

## 2020-02-16 MED ORDER — PALONOSETRON HCL INJECTION 0.25 MG/5ML
0.2500 mg | Freq: Once | INTRAVENOUS | Status: AC
  Administered 2020-02-16: 0.25 mg via INTRAVENOUS

## 2020-02-16 MED ORDER — FUROSEMIDE 10 MG/ML IJ SOLN
10.0000 mg | Freq: Once | INTRAMUSCULAR | Status: AC
  Administered 2020-02-16: 10 mg via INTRAVENOUS

## 2020-02-16 MED ORDER — SODIUM CHLORIDE 0.9 % IV SOLN
25.0000 mg/m2 | Freq: Once | INTRAVENOUS | Status: AC
  Administered 2020-02-16: 54 mg via INTRAVENOUS
  Filled 2020-02-16: qty 54

## 2020-02-16 MED ORDER — POTASSIUM CHLORIDE 2 MEQ/ML IV SOLN
Freq: Once | INTRAVENOUS | Status: AC
  Filled 2020-02-16: qty 10

## 2020-02-16 NOTE — Patient Instructions (Signed)
[facility] Discharge Instructions for Patients Receiving Chemotherapy  Today you received the following chemotherapy agents Gemzar, Cisplatin  To help prevent nausea and vomiting after your treatment, we encourage you to take your nausea medication as directed   If you develop nausea and vomiting that is not controlled by your nausea medication, call the clinic.   BELOW ARE SYMPTOMS THAT SHOULD BE REPORTED IMMEDIATELY:  *FEVER GREATER THAN 100.5 F  *CHILLS WITH OR WITHOUT FEVER  NAUSEA AND VOMITING THAT IS NOT CONTROLLED WITH YOUR NAUSEA MEDICATION  *UNUSUAL SHORTNESS OF BREATH  *UNUSUAL BRUISING OR BLEEDING  TENDERNESS IN MOUTH AND THROAT WITH OR WITHOUT PRESENCE OF ULCERS  *URINARY PROBLEMS  *BOWEL PROBLEMS  UNUSUAL RASH Items with * indicate a potential emergency and should be followed up as soon as possible.  Feel free to call the clinic should you have any questions or concerns. The clinic phone number is [phone].  Please show the CHEMO ALERT CARD at check-in to the Emergency Department and triage nurse.   

## 2020-02-18 ENCOUNTER — Inpatient Hospital Stay: Payer: Medicare HMO

## 2020-02-18 ENCOUNTER — Other Ambulatory Visit: Payer: Self-pay

## 2020-02-18 VITALS — BP 151/87 | HR 86 | Temp 97.3°F | Resp 19

## 2020-02-18 DIAGNOSIS — Z7189 Other specified counseling: Secondary | ICD-10-CM

## 2020-02-18 DIAGNOSIS — Z5189 Encounter for other specified aftercare: Secondary | ICD-10-CM | POA: Diagnosis not present

## 2020-02-18 DIAGNOSIS — R635 Abnormal weight gain: Secondary | ICD-10-CM | POA: Diagnosis not present

## 2020-02-18 DIAGNOSIS — R600 Localized edema: Secondary | ICD-10-CM | POA: Diagnosis not present

## 2020-02-18 DIAGNOSIS — C221 Intrahepatic bile duct carcinoma: Secondary | ICD-10-CM

## 2020-02-18 DIAGNOSIS — E119 Type 2 diabetes mellitus without complications: Secondary | ICD-10-CM | POA: Diagnosis not present

## 2020-02-18 DIAGNOSIS — I10 Essential (primary) hypertension: Secondary | ICD-10-CM | POA: Diagnosis not present

## 2020-02-18 DIAGNOSIS — Z5111 Encounter for antineoplastic chemotherapy: Secondary | ICD-10-CM | POA: Diagnosis not present

## 2020-02-18 MED ORDER — PEGFILGRASTIM-JMDB 6 MG/0.6ML ~~LOC~~ SOSY
6.0000 mg | PREFILLED_SYRINGE | Freq: Once | SUBCUTANEOUS | Status: AC
  Administered 2020-02-18: 6 mg via SUBCUTANEOUS

## 2020-02-18 MED ORDER — PEGFILGRASTIM-JMDB 6 MG/0.6ML ~~LOC~~ SOSY
PREFILLED_SYRINGE | SUBCUTANEOUS | Status: AC
  Filled 2020-02-18: qty 0.6

## 2020-02-18 NOTE — Patient Instructions (Signed)

## 2020-02-20 ENCOUNTER — Other Ambulatory Visit: Payer: Self-pay | Admitting: Radiology

## 2020-02-23 ENCOUNTER — Other Ambulatory Visit: Payer: Self-pay | Admitting: Nurse Practitioner

## 2020-02-23 ENCOUNTER — Ambulatory Visit (HOSPITAL_COMMUNITY)
Admission: RE | Admit: 2020-02-23 | Discharge: 2020-02-23 | Disposition: A | Discharge status: Home / Self Care | Payer: Medicare HMO | Source: Ambulatory Visit | Attending: Nurse Practitioner | Admitting: Nurse Practitioner

## 2020-02-23 ENCOUNTER — Telehealth: Payer: Self-pay

## 2020-02-23 ENCOUNTER — Encounter (HOSPITAL_COMMUNITY): Payer: Self-pay

## 2020-02-23 ENCOUNTER — Other Ambulatory Visit: Payer: Self-pay

## 2020-02-23 ENCOUNTER — Ambulatory Visit (HOSPITAL_COMMUNITY)
Admission: RE | Admit: 2020-02-23 | Discharge: 2020-02-23 | Disposition: A | Discharge status: Home / Self Care | Payer: Medicare HMO | Source: Ambulatory Visit | Attending: Hematology | Admitting: Hematology

## 2020-02-23 DIAGNOSIS — C221 Intrahepatic bile duct carcinoma: Secondary | ICD-10-CM

## 2020-02-23 DIAGNOSIS — E119 Type 2 diabetes mellitus without complications: Secondary | ICD-10-CM | POA: Insufficient documentation

## 2020-02-23 DIAGNOSIS — K219 Gastro-esophageal reflux disease without esophagitis: Secondary | ICD-10-CM | POA: Insufficient documentation

## 2020-02-23 DIAGNOSIS — E785 Hyperlipidemia, unspecified: Secondary | ICD-10-CM | POA: Diagnosis not present

## 2020-02-23 DIAGNOSIS — Z79899 Other long term (current) drug therapy: Secondary | ICD-10-CM | POA: Diagnosis not present

## 2020-02-23 DIAGNOSIS — I10 Essential (primary) hypertension: Secondary | ICD-10-CM | POA: Insufficient documentation

## 2020-02-23 DIAGNOSIS — Z5111 Encounter for antineoplastic chemotherapy: Secondary | ICD-10-CM | POA: Diagnosis not present

## 2020-02-23 DIAGNOSIS — Z7984 Long term (current) use of oral hypoglycemic drugs: Secondary | ICD-10-CM | POA: Insufficient documentation

## 2020-02-23 DIAGNOSIS — Z7982 Long term (current) use of aspirin: Secondary | ICD-10-CM | POA: Insufficient documentation

## 2020-02-23 HISTORY — PX: IR IMAGING GUIDED PORT INSERTION: IMG5740

## 2020-02-23 LAB — CBC WITH DIFFERENTIAL/PLATELET
Abs Immature Granulocytes: 1.3 10*3/uL — ABNORMAL HIGH (ref 0.00–0.07)
Band Neutrophils: 21 %
Basophils Absolute: 0 10*3/uL (ref 0.0–0.1)
Basophils Relative: 0 %
Eosinophils Absolute: 0 10*3/uL (ref 0.0–0.5)
Eosinophils Relative: 0 %
HCT: 27 % — ABNORMAL LOW (ref 39.0–52.0)
Hemoglobin: 8.8 g/dL — ABNORMAL LOW (ref 13.0–17.0)
Lymphocytes Relative: 2 %
Lymphs Abs: 0.6 10*3/uL — ABNORMAL LOW (ref 0.7–4.0)
MCH: 35.5 pg — ABNORMAL HIGH (ref 26.0–34.0)
MCHC: 32.6 g/dL (ref 30.0–36.0)
MCV: 108.9 fL — ABNORMAL HIGH (ref 80.0–100.0)
Monocytes Absolute: 1.6 10*3/uL — ABNORMAL HIGH (ref 0.1–1.0)
Monocytes Relative: 5 %
Myelocytes: 4 %
Neutro Abs: 28.8 10*3/uL — ABNORMAL HIGH (ref 1.7–7.7)
Neutrophils Relative %: 68 %
Platelets: 71 10*3/uL — ABNORMAL LOW (ref 150–400)
RBC: 2.48 MIL/uL — ABNORMAL LOW (ref 4.22–5.81)
RDW: 17.5 % — ABNORMAL HIGH (ref 11.5–15.5)
WBC Morphology: INCREASED
WBC: 32.4 10*3/uL — ABNORMAL HIGH (ref 4.0–10.5)
nRBC: 0.5 % — ABNORMAL HIGH (ref 0.0–0.2)

## 2020-02-23 LAB — GLUCOSE, CAPILLARY: Glucose-Capillary: 103 mg/dL — ABNORMAL HIGH (ref 70–99)

## 2020-02-23 LAB — PROTIME-INR
INR: 1.1 (ref 0.8–1.2)
Prothrombin Time: 14.4 seconds (ref 11.4–15.2)

## 2020-02-23 IMAGING — US IR IMAGING GUIDED PORT INSERTION
2 series · 3 of 3 positions shown · non-contrast
Comparison: none

CLINICAL DATA: Cholangiocarcinoma. Poor peripheral IV access.
Durable venous access needed for planned chemotherapy regimen.
TECHNIQUE: The procedure, risks, benefits, and alternatives were explained to
the patient. Questions regarding the procedure were encouraged and
answered. The patient understands and consents to the procedure.

[Series 1: (id) · 2 of 2 slices shown]
[im 1/2]
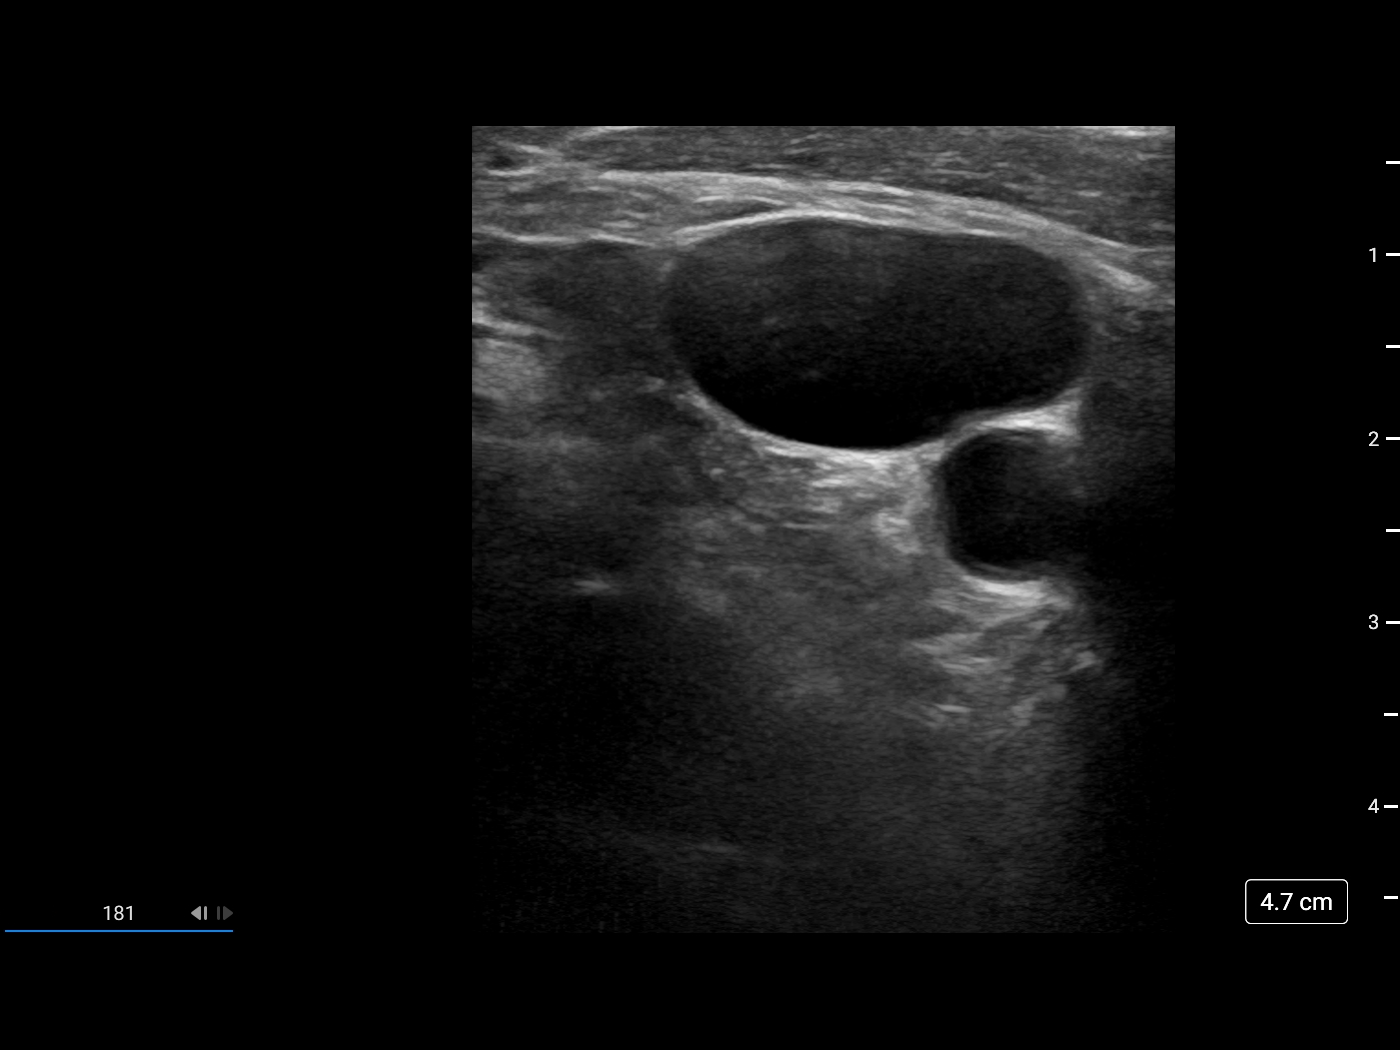
[im 2/2]
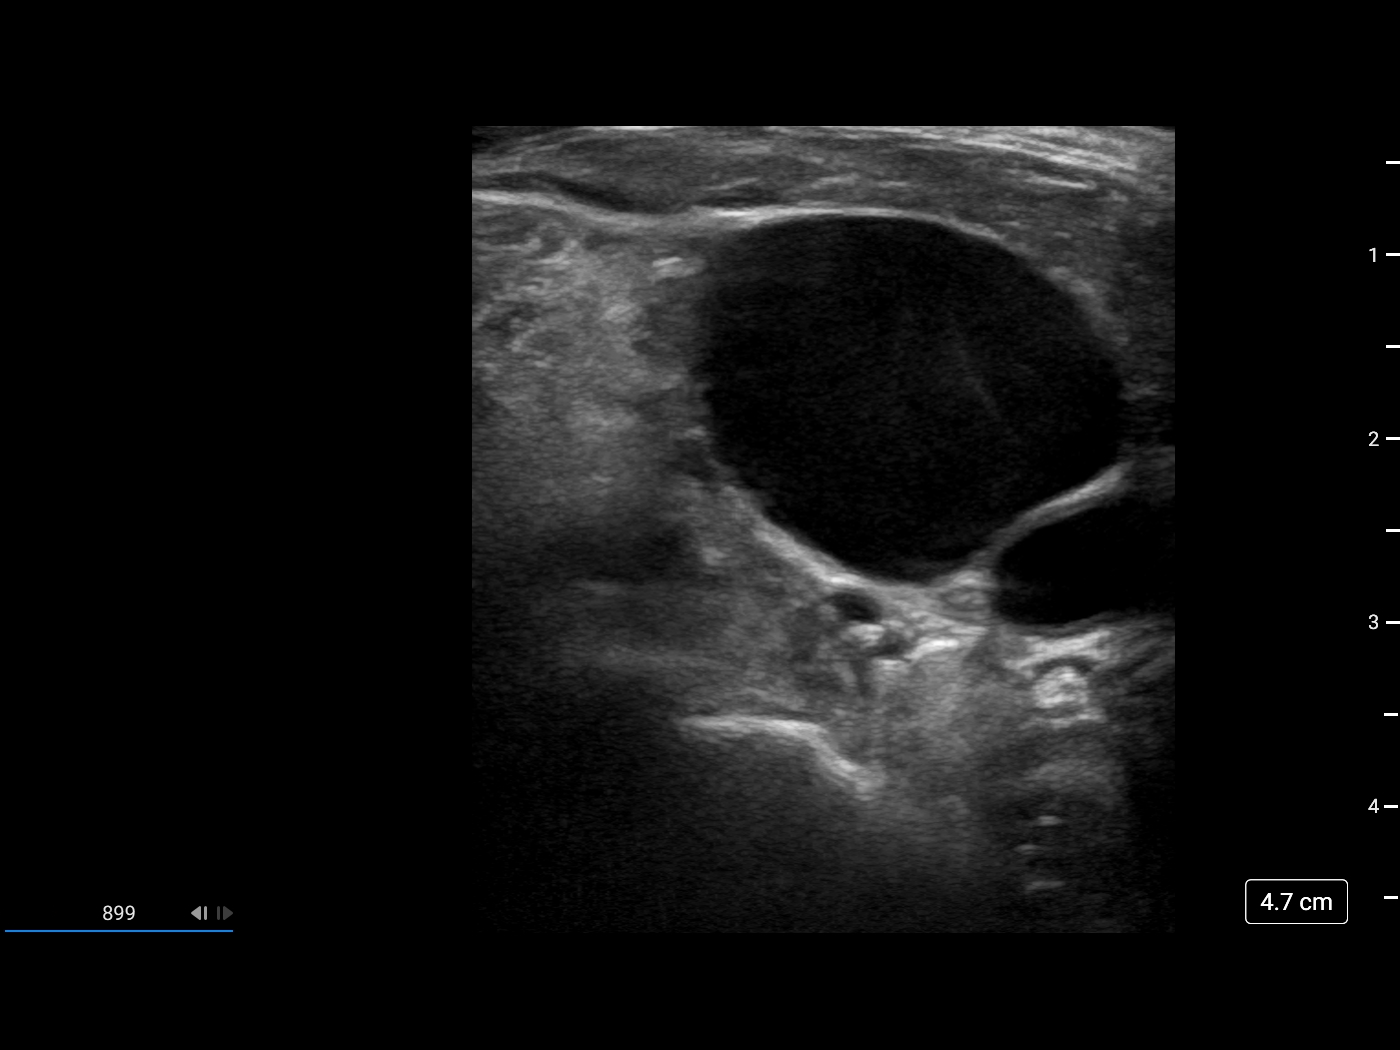

[Series 300: line placements · 1 of 1 slices shown]
[im 1/1]
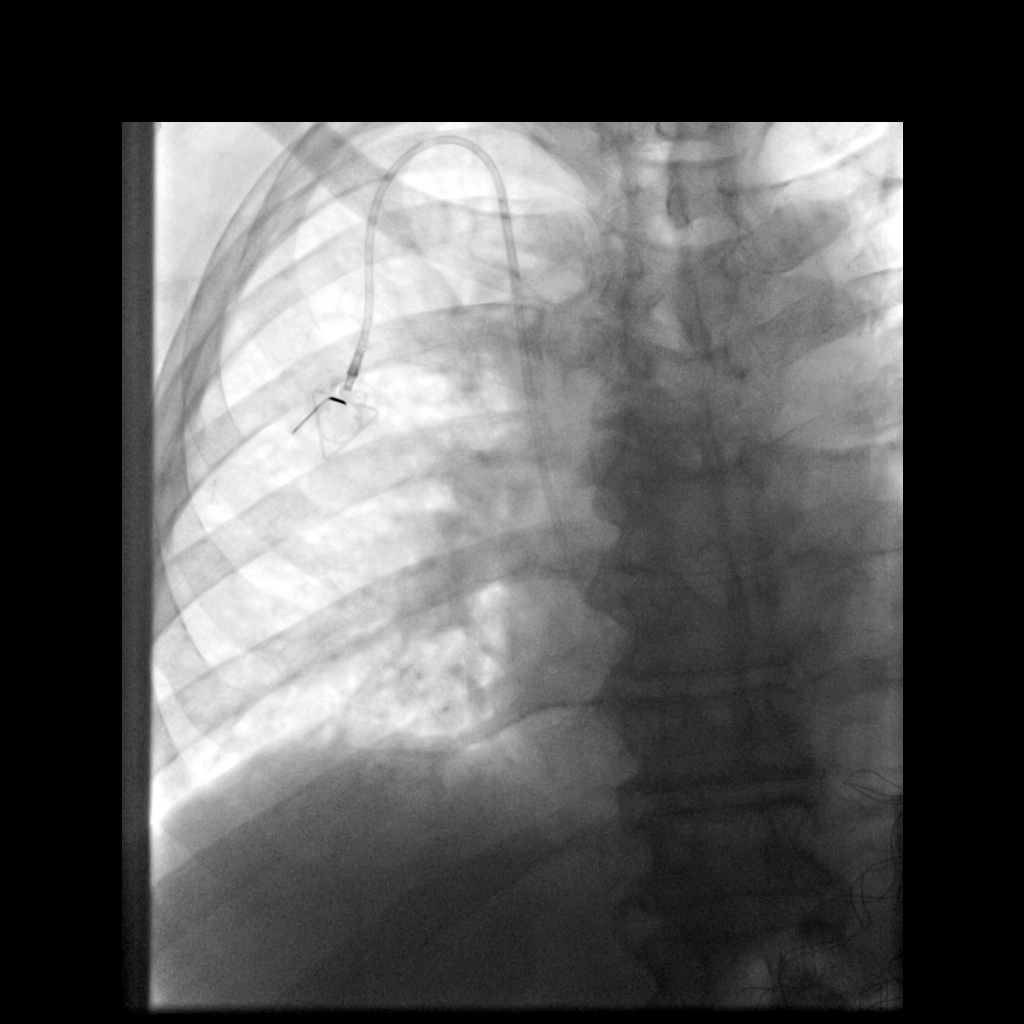

[3 of 3 positions shown; findings below may reference images not displayed]

EXAM:
TUNNELED PORT CATHETER PLACEMENT WITH ULTRASOUND AND FLUOROSCOPIC
GUIDANCE

FLUOROSCOPY TIME:  0.2 minute; 128 [SX] DAP

ANESTHESIA/SEDATION:
Intravenous Fentanyl [SX] and Versed 1mg were administered as
conscious sedation during continuous monitoring of the patient's
level of consciousness and physiological / cardiorespiratory status
by the radiology RN, with a total moderate sedation time of 15
minutes.
As antibiotic prophylaxis, cefazolin 2 g was ordered pre-procedure
and administered intravenously within one hour of incision.

Patency of the right IJ vein was confirmed with ultrasound with
image documentation. An appropriate skin site was determined. Skin
site was marked. Region was prepped using maximum barrier technique
including cap and mask, sterile gown, sterile gloves, large sterile
sheet, and Chlorhexidine as cutaneous antisepsis. The region was
infiltrated locally with 1% lidocaine. Under real-time ultrasound
guidance, the right IJ vein was accessed with a 21 gauge
micropuncture needle; the needle tip within the vein was confirmed
with ultrasound image documentation. Needle was exchanged over a 018
guidewire for transitional dilator, and vascular measurement was
performed.

A small incision was made on the right anterior chest wall and a
subcutaneous pocket fashioned. The power-injectable port was
positioned and its catheter tunneled to the right IJ dermatotomy
site. The transitional dilator was exchanged over an Amplatz wire
for a peel-away sheath, through which the port catheter, which had
been trimmed to the appropriate length, was advanced and positioned
under fluoroscopy with its tip at the cavoatrial junction. Spot
chest radiograph confirms good catheter position and no
pneumothorax. The port was flushed per protocol. The pocket was
closed with deep interrupted and subcuticular continuous 3-0
Monocryl sutures. The incisions were covered with Dermabond then
covered with a sterile dressing.

The patient tolerated the procedure well.

COMPLICATIONS:
COMPLICATIONS
None immediate
IMPRESSION: Technically successful right IJ power-injectable port catheter
placement. Ready for routine use.

## 2020-02-23 MED ORDER — FENTANYL CITRATE (PF) 100 MCG/2ML IJ SOLN
INTRAMUSCULAR | Status: AC
  Filled 2020-02-23: qty 2

## 2020-02-23 MED ORDER — CEFAZOLIN SODIUM-DEXTROSE 2-4 GM/100ML-% IV SOLN
INTRAVENOUS | Status: AC
  Administered 2020-02-23: 2 g via INTRAVENOUS
  Filled 2020-02-23: qty 100

## 2020-02-23 MED ORDER — CEFAZOLIN SODIUM-DEXTROSE 2-4 GM/100ML-% IV SOLN: 2.0000 g | INTRAVENOUS | Status: AC

## 2020-02-23 MED ORDER — SODIUM CHLORIDE 0.9 % IV SOLN: INTRAVENOUS | Status: DC

## 2020-02-23 MED ORDER — HEPARIN SOD (PORK) LOCK FLUSH 100 UNIT/ML IV SOLN
INTRAVENOUS | Status: AC | PRN
  Administered 2020-02-23: 500 [IU] via INTRAVENOUS

## 2020-02-23 MED ORDER — HEPARIN SOD (PORK) LOCK FLUSH 100 UNIT/ML IV SOLN
INTRAVENOUS | Status: AC
  Filled 2020-02-23: qty 5

## 2020-02-23 MED ORDER — LIDOCAINE-EPINEPHRINE 1 %-1:100000 IJ SOLN
INTRAMUSCULAR | Status: AC | PRN
  Administered 2020-02-23 (×2): 10 mL via INTRADERMAL

## 2020-02-23 MED ORDER — LIDOCAINE-EPINEPHRINE (PF) 2 %-1:200000 IJ SOLN
INTRAMUSCULAR | Status: AC
  Filled 2020-02-23: qty 20

## 2020-02-23 MED ORDER — MIDAZOLAM HCL 2 MG/2ML IJ SOLN
INTRAMUSCULAR | Status: AC | PRN
  Administered 2020-02-23: 1 mg via INTRAVENOUS

## 2020-02-23 MED ORDER — FENTANYL CITRATE (PF) 100 MCG/2ML IJ SOLN
INTRAMUSCULAR | Status: AC | PRN
  Administered 2020-02-23: 50 ug via INTRAVENOUS

## 2020-02-23 MED ORDER — MIDAZOLAM HCL 2 MG/2ML IJ SOLN
INTRAMUSCULAR | Status: AC
  Filled 2020-02-23: qty 2

## 2020-02-23 NOTE — H&P (Signed)
Referring Physician(s): Feng,Y  Supervising Physician: Arne Cleveland  Patient Status:  [facility]  Chief Complaint: "I'm getting a port a cath"   Subjective: Patient familiar to IR service from liver lesion biopsy on 08/11/2019.  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  He also has poor venous access and presents today for Port-A-Cath placement for additional chemotherapy.  He currently denies fever, headache, chest pain, dyspnea, cough, back pain, vomiting or bleeding.  He does have some epigastric discomfort and occasional nausea.  Past Medical History:  Diagnosis Date  . Arthritis   . Diabetes ([place])   . GERD (gastroesophageal reflux disease)   . Hyperlipidemia   . Hypertension   . 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 APPENDECTOMY  1980  . COLONOSCOPY         Allergies: Patient has no known allergies.  Medications: Prior to Admission medications   Medication Sig Start Date End Date Taking? Authorizing Provider  allopurinol (ZYLOPRIM) 100 MG tablet Take 100 mg by mouth daily.    [provider]  amLODipine (NORVASC) 2.5 MG tablet Take 2.5 mg by mouth daily.    [provider]  aspirin EC 81 MG tablet Take 81 mg by mouth daily.    [provider]  famotidine (PEPCID) 40 MG tablet Take 0.5 tablets (20 mg total) by mouth 2 (two) times daily. 04/29/19   [name], [name], MD  fenofibrate (TRICOR) 145 MG tablet Take 145 mg by mouth daily.    [provider]  finasteride (PROSCAR) 5 MG tablet Take 5 mg by mouth daily.    [provider]  lisinopril (ZESTRIL) 40 MG tablet Take 40 mg by mouth daily.    [provider]  magnesium oxide (MAG-OX) 400 (241.3 Mg) MG tablet Take 1 tablet (400 mg total) by mouth daily. 02/09/20   Alla Feeling, NP  metFORMIN (GLUCOPHAGE) 500 MG tablet Take 500 mg by mouth 2 (two) times daily with a meal.      [provider]  ondansetron (ZOFRAN) 4 MG tablet Take 4 mg by mouth every 8 (eight) hours as needed for nausea or vomiting.    [provider]  ondansetron (ZOFRAN) 8 MG tablet Take 1 tablet (8 mg total) by mouth 2 (two) times daily as needed. Start on the third day after chemotherapy. 09/22/19   [name], MD  pantoprazole (PROTONIX) 40 MG tablet TAKE 1 TABLET BY MOUTH TWICE A DAY 12/08/19   Zehr, Janett Billow D, PA-C  pravastatin (PRAVACHOL) 40 MG tablet Take 40 mg by mouth daily.    [provider]  prochlorperazine (COMPAZINE) 10 MG tablet Take 1 tablet (10 mg total) by mouth every 6 (six) hours as needed (Nausea or vomiting). 09/22/19   [name], MD  promethazine (PHENERGAN) 12.5 MG tablet TAKE 1 TABLET BY MOUTH EVERY 6 HOURS AS NEEDED FOR NAUSEA OR VOMITING. 09/16/19   Zehr, Janett Billow D, PA-C  sucralfate (CARAFATE) 1 g tablet Take 1 tablet (1 g total) by mouth every 6 (six) hours as needed. 01/01/20   [name], [name], MD  tamsulosin (FLOMAX) 0.4 MG CAPS capsule Take 0.4 mg by mouth daily.    [provider]  Vitamin D, Ergocalciferol, (DRISDOL) 1.25 MG (50000 UT) CAPS capsule Take 50,000 Units by mouth every 7 (seven) days.    [provider]     Vital Signs: Blood pressure 152/88, heart rate 95, temp  98.4, respirations 18, O2 sat 96% room air   Physical Exam awake, alert.  Chest clear to auscultation bilaterally.  Heart with irreg irreg rhythm .  Abdomen soft, positive bowel sounds, mildly tender epigastric region to palpation.  Trace pretibial edema bilaterally.  Imaging: No results found.  Labs:  CBC: Recent Labs    01/19/20 0819 01/26/20 0829 02/09/20 0836 02/16/20 0827  WBC 16.4* 5.5 13.8* 8.1  HGB 9.6* 9.2* 9.7* 8.9*  HCT 30.4* 28.0* 29.8* 26.8*  PLT 196 202 201 185    COAGS: Recent Labs    08/11/19 1059  INR 1.0    BMP: Recent Labs    01/19/20 0819 01/26/20 0829 02/09/20 0836 02/16/20 0827  NA 140 141 143 141  K  4.5 4.4 4.1 4.2  CL 110 107 112* 109  CO2 21* 22 22 22   GLUCOSE 113* 101* 100* 95  BUN 11 19 16 19   CALCIUM 9.0 9.1 8.9 9.1  CREATININE 1.30* 1.19 1.31* 1.33*  GFRNONAA 55* >60 54* 53*  GFRAA >60 >60 >60 >60    LIVER FUNCTION TESTS: Recent Labs    01/19/20 0819 01/26/20 0829 02/09/20 0836 02/16/20 0827  BILITOT 0.4 0.4 0.4 0.4  AST 19 25 14* 17  ALT 14 20 12 16   ALKPHOS 86 63 83 62  PROT 6.5 6.7 6.3* 6.4*  ALBUMIN 3.8 3.9 3.8 3.7    Assessment and Plan: Pt with 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  He also has poor venous access and presents today for Port-A-Cath placement for additional chemotherapy. Risks and benefits of image guided port-a-catheter placement was discussed with the patient including, but not limited to bleeding, infection, pneumothorax, or fibrin sheath development and need for additional procedures.  All of the patient's questions were answered, patient is agreeable to proceed. Consent signed and in chart.  Patient's EKG today reveals atrial fibrillation which is new according to patient.  Heart rate 101.  Above discussed with Drs Evert Kohl.  Patient currently asymptomatic.  We will plan to proceed with port placement today and patient will follow up with Dr. Burr Medico regarding further management.   Electronically Signed: D. Rowe Robert, PA-C 02/23/2020, 10:21 AM   I spent a total of 25 minutes at the the patient's bedside AND on the patient's hospital floor or unit, greater than 50% of which was counseling/coordinating care for Port-A-Cath placement

## 2020-02-23 NOTE — Discharge Instructions (Signed)
Please call Interventional Radiology clinic [phone] with any questions or concerns about your port.  You may remove your dressing and shower tomorrow.  DO NOT use EMLA cream for 2 weeks after port placement as this cream will remove surgical glue on your incision.  Moderate Conscious Sedation, Adult, Care After These instructions provide you with information about caring for yourself after your procedure. Your health care provider may also give you more specific instructions. Your treatment has been planned according to current medical practices, but problems sometimes occur. Call your health care provider if you have any problems or questions after your procedure. What can I expect after the procedure? After your procedure, it is common:  To feel sleepy for several hours.  To feel clumsy and have poor balance for several hours.  To have poor judgment for several hours.  To vomit if you eat too soon. Follow these instructions at home: For at least 24 hours after the procedure:   Do not: ? Participate in activities where you could fall or become injured. ? Drive. ? Use heavy machinery. ? Drink alcohol. ? Take sleeping pills or medicines that cause drowsiness. ? Make important decisions or sign legal documents. ? Take care of children on your own.  Rest. Eating and drinking  Follow the diet recommended by your health care provider.  If you vomit: ? Drink water, juice, or soup when you can drink without vomiting. ? Make sure you have little or no nausea before eating solid foods. General instructions  Have a responsible adult stay with you until you are awake and alert.  Take over-the-counter and prescription medicines only as told by your health care provider.  If you smoke, do not smoke without supervision.  Keep all follow-up visits as told by your health care provider. This is important. Contact a health care provider if:  You keep feeling nauseous or you keep  vomiting.  You feel light-headed.  You develop a rash.  You have a fever. Get help right away if:  You have trouble breathing. This information is not intended to replace advice given to you by your health care provider. Make sure you discuss any questions you have with your health care provider. Document Revised: 10/19/2017 Document Reviewed: 02/26/2016 Elsevier Patient Education  [place] Insertion, Care After This sheet gives you information about how to care for yourself after your procedure. Your health care provider may also give you more specific instructions. If you have problems or questions, contact your health care provider. What can I expect after the procedure? After the procedure, it is common to have:  Discomfort at the port insertion site.  Bruising on the skin over the port. This should improve over 3-4 days. Follow these instructions at home: [facility] care  After your port is placed, you will get a manufacturer's information card. The card has information about your port. Keep this card with you at all times.  Take care of the port as told by your health care provider. Ask your health care provider if you or a family member can get training for taking care of the port at home. A home health care nurse may also take care of the port.  Make sure to remember what type of port you have. Incision care      Follow instructions from your health care provider about how to take care of your port insertion site. Make sure you: ? Wash your hands with soap and  water before and after you change your bandage (dressing). If soap and water are not available, use hand sanitizer. ? Change your dressing as told by your health care provider. ? Leave stitches (sutures), skin glue, or adhesive strips in place. These skin closures may need to stay in place for 2 weeks or longer. If adhesive strip edges start to loosen and curl up, you may trim the loose  edges. Do not remove adhesive strips completely unless your health care provider tells you to do that.  Check your port insertion site every day for signs of infection. Check for: ? Redness, swelling, or pain. ? Fluid or blood. ? Warmth. ? Pus or a bad smell. Activity  Return to your normal activities as told by your health care provider. Ask your health care provider what activities are safe for you.  Do not lift anything that is heavier than 10 lb (4.5 kg), or the limit that you are told, until your health care provider says that it is safe. General instructions  Take over-the-counter and prescription medicines only as told by your health care provider.  Do not take baths, swim, or use a hot tub until your health care provider approves. Ask your health care provider if you may take showers. You may only be allowed to take sponge baths.  Do not drive for 24 hours if you were given a sedative during your procedure.  Wear a medical alert bracelet in case of an emergency. This will tell any health care providers that you have a port.  Keep all follow-up visits as told by your health care provider. This is important. Contact a health care provider if:  You cannot flush your port with saline as directed, or you cannot draw blood from the port.  You have a fever or chills.  You have redness, swelling, or pain around your port insertion site.  You have fluid or blood coming from your port insertion site.  Your port insertion site feels warm to the touch.  You have pus or a bad smell coming from the port insertion site. Get help right away if:  You have chest pain or shortness of breath.  You have bleeding from your port that you cannot control. Summary  Take care of the port as told by your health care provider. Keep the manufacturer's information card with you at all times.  Change your dressing as told by your health care provider.  Contact a health care provider if you  have a fever or chills or if you have redness, swelling, or pain around your port insertion site.  Keep all follow-up visits as told by your health care provider. This information is not intended to replace advice given to you by your health care provider. Make sure you discuss any questions you have with your health care provider. Document Revised: 06/04/2018 Document Reviewed: 06/04/2018 Elsevier Patient Education  [place].

## 2020-02-23 NOTE — Procedures (Signed)
  Procedure: R IJ Port catheter placement   EBL:   minimal Complications:  none immediate  See full dictation in [facility] PACS.  D. Jnae Thomaston MD Main # [phone] Pager  [phone]    

## 2020-02-23 NOTE — Telephone Encounter (Signed)
I spoke with Becky with [facility], Ihor Dow PA-C office.  I told her EKG this am prior to port placement revealed Mr Athey was in controlled afib.  I told her he is asymptomatic.  I also faxed note from IR and EKG

## 2020-02-25 NOTE — Progress Notes (Signed)
Pharmacist Chemotherapy Monitoring - Follow Up Assessment    I verify that I have reviewed each item in the below checklist:  . Regimen for the patient is scheduled for the appropriate day and plan matches scheduled date. Marland Kitchen Appropriate non-routine labs are ordered dependent on drug ordered. . If applicable, additional medications reviewed and ordered per protocol based on lifetime cumulative doses and/or treatment regimen.   Plan for follow-up and/or issues identified: No . I-vent associated with next due treatment: Yes . MD and/or nursing notified: No  Acquanetta Belling 02/25/2020 8:07 AM

## 2020-02-26 NOTE — Progress Notes (Signed)
[place]   [phone] [phone]   Clinic Follow up Note   Patient Care Team: [name], PA as PCP - General (Family Medicine) [name], MD as Consulting Physician (General Surgery) [name], [name], MD as Consulting Physician (Gastroenterology) [name], MD as Consulting Physician (Hematology)  Date of Service:  03/01/2020  CHIEF COMPLAINT: : F/u of liver cancer  SUMMARY OF ONCOLOGIC HISTORY: Oncology History Overview Note  Cancer Staging Intrahepatic cholangiocarcinoma ([place]) Staging form: Intrahepatic Bile Duct, AJCC 8th Edition - Clinical stage from 08/19/2019: Stage II (cT2, cN0, cM0) - Signed by [name], MD on 08/19/2019    Intrahepatic cholangiocarcinoma ([place])  06/28/2019 Imaging   CT AP W Contrast 06/28/19  IMPRESSION: 1. Heterogeneous hypodensity posteriorly in the right hepatic lobe and potentially extending into the caudate lobe suspicious for a mass. There is felt to be truncation of branches of the portal vein in this vicinity and some narrowing of the hepatic vein, as well as triangular-shaped regions of abnormal hypoenhancement posteriorly in the right hepatic lobe likely representing downstream vascular effects. Cannot exclude malignancy such as cholangiocarcinoma or hepatocellular carcinoma, and follow up hepatic protocol MRI with and without contrast is recommended to further characterize. 2. 4 mm right middle lobe pulmonary nodule is likely benign but may merit surveillance. 3. Cholelithiasis. 4.  Aortic Atherosclerosis (ICD10-I70.0). 5. Prostatomegaly. 6. Mild impingement at L3-4 and L4-5.   07/31/2019 Imaging   MRI Liver 07/31/19 IMPRESSION: 1. 7.3 cm in long axis mass in the right hepatic lobe spanning into the caudate lobe, high suspicion for malignancy such as hepatocellular carcinoma or cholangiocarcinoma. Suspected effacement or occlusion of the right hepatic vein and posterior branches of  the right portal vein. Two smaller tumor nodules along the posterior periphery of the dominant mass. Tissue diagnosis is recommended. 2. No findings of pathologic adenopathy or distant metastatic spread. 3. 9 mm gallstone in the gallbladder. There is mild gallbladder wall thickening which may be from nondistention, correlate clinically in assessing for cholecystitis. 4.  Aortic Atherosclerosis (ICD10-I70.0). 5. Mild diffuse hepatic steatosis.   08/11/2019 Initial Biopsy   DIAGNOSIS: 08/11/19  A. LIVER, RIGHT, BIOPSY:  - Adenocarcinoma.   08/18/2019 Imaging   CT Chest 08/18/19  IMPRESSION: 1. Multiple pulmonary nodules largest at approximately 7 mm in the right lower lobe, nonspecific but concerning given findings in the liver. 2. No signs of definitive metastatic disease, also with mildly enlarged upper abdominal lymph nodes as discussed.   Aortic Atherosclerosis (ICD10-I70.0).   08/19/2019 Initial Diagnosis   Intrahepatic cholangiocarcinoma ([place])   08/19/2019 Cancer Staging   Staging form: Intrahepatic Bile Duct, AJCC 8th Edition - Clinical stage from 08/19/2019: Stage II (cT2, cN0, cM0) - Signed by [name], MD on 08/19/2019   09/29/2019 -  Chemotherapy   Cisplatin and Gemcitabine 2 weeks on/1 week off starting 09/29/19    10/09/2019 Imaging   CT AP IMPRESSION: 1. The dominant right hepatic lobe mass is minimally reduced in size compared to prior exams, currently measuring 6.2 by 5.3 cm, previously 6.2 by 5.6 cm. However, there is a new small hypodense lesion centrally in the right hepatic lobe which is suspicious for a new small focus of tumor. Accordingly this is an overall mixed appearance. 2. Continued hypoenhancement in the liver downstream of the tumor likely attributable to narrowing or occlusion of the right hepatic vein by the tumor. By virtue of its location the tumor wraps around the intrahepatic portion of the IVC. 3. 4  mm right middle lobe pulmonary nodule, stable  compared to earliest available comparison of 07/18/2019. Surveillance of the patient's pulmonary nodules is recommended. 4. Other imaging findings of potential clinical significance: Coronary atherosclerosis. Cholelithiasis. Prominent stool throughout the colon favors constipation. Moderate prostatomegaly with heterogeneous enhancement of the prostate gland. Lumbar spondylosis and degenerative disc disease causing mild bilateral foraminal impingement at L3-4 and L4-5.   Aortic Atherosclerosis (ICD10-I70.0).   12/24/2019 Imaging   CT CAP W Contrast  IMPRESSION: 1. Right hepatic lobe mass and adjacent right hepatic lobe nodules appear grossly stable. No evidence of distant metastatic disease. 2. Continued stability of small pulmonary nodules. Recommend attention on follow-up. 3. Cholelithiasis. 4. Enlarged prostate. 5. Aortic atherosclerosis (ICD10-I70.0). Coronary artery calcification.      CURRENT THERAPY:  Cisplatin and Gemcitabine 2 weeks on/1 week off starting 09/29/19  INTERVAL HISTORY:  Brandon Sherman is here for a follow up and treatment. He presents to the clinic with family member. He notes he is doing well. He had PAC placed and with that he was seen to have Afib. His PCP wants him to see a cardiologist. He notes he tolerated last cycle chemo well. He continues to tolerates well. He still has intermittent abdominal pain. He is on Sucralfate before each meal. 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     REVIEW OF SYSTEMS:   Constitutional: Denies fevers, chills or abnormal weight loss Eyes: Denies blurriness of vision Ears, nose, mouth, throat, and face: Denies mucositis or sore throat Respiratory: Denies cough, dyspnea or wheezes Cardiovascular: Denies palpitation, chest discomfort or lower extremity swelling Gastrointestinal:  Denies nausea, heartburn or change in bowel habits (+) intermittent epigastric pain    Skin: Denies abnormal skin rashes Lymphatics: Denies new lymphadenopathy (+) easy bruising of upper extremity Neurological:Denies numbness, tingling or new weaknesses Behavioral/Psych: Mood is stable, no new changes  All other systems were reviewed with the patient and are negative.  MEDICAL HISTORY:  Past Medical History:  Diagnosis Date  . Arthritis   . Diabetes ([place])   . GERD (gastroesophageal reflux disease)   . Hyperlipidemia   . Hypertension   . Intrahepatic cholangiocarcinoma ([place])     SURGICAL HISTORY: Past Surgical History:  Procedure Laterality Date  . APPENDECTOMY  1980  . COLONOSCOPY    . IR IMAGING GUIDED PORT INSERTION  02/23/2020    I have reviewed the social history and family history with the patient and they are unchanged from previous note.  ALLERGIES:  has No Known Allergies.  MEDICATIONS:  Current Outpatient Medications  Medication Sig Dispense Refill  . allopurinol (ZYLOPRIM) 100 MG tablet Take 100 mg by mouth daily.    Marland Kitchen amLODipine (NORVASC) 2.5 MG tablet Take 2.5 mg by mouth daily.    Marland Kitchen aspirin EC 81 MG tablet Take 81 mg by mouth daily.    . famotidine (PEPCID) 40 MG tablet Take 0.5 tablets (20 mg total) by mouth 2 (two) times daily. 30 tablet 5  . fenofibrate (TRICOR) 145 MG tablet Take 145 mg by mouth daily.    . finasteride (PROSCAR) 5 MG tablet Take 5 mg by mouth daily.    Marland Kitchen lisinopril (ZESTRIL) 40 MG tablet Take 40 mg by mouth daily.    . magnesium oxide (MAG-OX) 400 (241.3 Mg) MG tablet Take 1 tablet (400 mg total) by mouth daily. 30 tablet 0  . metFORMIN (GLUCOPHAGE) 500 MG tablet Take 500 mg by mouth  2 (two) times daily with a meal.     . metoprolol succinate (TOPROL XL) 25 MG 24 hr tablet Take 1 tablet (25 mg total) by mouth daily. 30 tablet 0  . ondansetron (ZOFRAN) 4 MG tablet Take 4 mg by mouth every 8 (eight) hours as needed for nausea or vomiting.    . ondansetron (ZOFRAN) 8 MG tablet Take 1 tablet (8 mg total) by mouth 2 (two) times  daily as needed. Start on the third day after chemotherapy. 30 tablet 1  . pantoprazole (PROTONIX) 40 MG tablet TAKE 1 TABLET BY MOUTH TWICE A DAY 180 tablet 0  . pravastatin (PRAVACHOL) 40 MG tablet Take 40 mg by mouth daily.    . prochlorperazine (COMPAZINE) 10 MG tablet Take 1 tablet (10 mg total) by mouth every 6 (six) hours as needed (Nausea or vomiting). 30 tablet 1  . promethazine (PHENERGAN) 12.5 MG tablet TAKE 1 TABLET BY MOUTH EVERY 6 HOURS AS NEEDED FOR NAUSEA OR VOMITING. 20 tablet 1  . sucralfate (CARAFATE) 1 g tablet Take 1 tablet (1 g total) by mouth every 6 (six) hours as needed. 180 tablet 1  . tamsulosin (FLOMAX) 0.4 MG CAPS capsule Take 0.4 mg by mouth daily.    . Vitamin D, Ergocalciferol, (DRISDOL) 1.25 MG (50000 UT) CAPS capsule Take 50,000 Units by mouth every 7 (seven) days.     No current facility-administered medications for this visit.   Facility-Administered Medications Ordered in Other Visits  Medication Dose Route Frequency Provider Last Rate Last Admin  . heparin lock flush 100 unit/mL  500 Units Intracatheter Once PRN [name], MD      . sodium chloride flush (NS) 0.9 % injection 10 mL  10 mL Intracatheter PRN [name], MD        PHYSICAL EXAMINATION: ECOG PERFORMANCE STATUS: 1 - Symptomatic but completely ambulatory  Vitals:   03/01/20 0836  BP: (!) 162/91  Pulse: 97  Resp: 20  Temp: 98.3 F (36.8 C)  SpO2: 100%   Filed Weights   03/01/20 0836  Weight: 232 lb 9.6 oz (105.5 kg)    GENERAL:alert, no distress and comfortable SKIN: skin color, texture, turgor are normal, no rashes or significant lesions (+) Diffuse skin bruising of upper extremity EYES: normal, Conjunctiva are pink and non-injected, sclera clear  NECK: supple, thyroid normal size, non-tender, without nodularity LYMPH:  no palpable lymphadenopathy in the cervical, axillary  LUNGS: clear to auscultation and percussion with normal breathing effort HEART: no murmurs and no lower  extremity edema (+) irregular heart beat consistent with Afib (+) Mild Tachycardia  ABDOMEN:abdomen soft, non-tender and normal bowel sounds Musculoskeletal:no cyanosis of digits and no clubbing  NEURO: alert & oriented x 3 with fluent speech, no focal motor/sensory deficits  LABORATORY DATA:  I have reviewed the data as listed CBC Latest Ref Rng & Units 03/01/2020 02/23/2020 02/16/2020  WBC 4.0 - 10.5 K/uL 18.8(H) 32.4(H) 8.1  Hemoglobin 13.0 - 17.0 g/dL 8.7(L) 8.8(L) 8.9(L)  Hematocrit 39.0 - 52.0 % 27.3(L) 27.0(L) 26.8(L)  Platelets 150 - 400 K/uL 193 71(L) 185     CMP Latest Ref Rng & Units 03/01/2020 02/16/2020 02/09/2020  Glucose 70 - 99 mg/dL 114(H) 95 100(H)  BUN 8 - 23 mg/dL _0 Creatinine 0.61 - 1.24 mg/dL 1.33(H) 1.33(H) 1.31(H)  Sodium 135 - 145 mmol/L 144 141 143  Potassium 3.5 - 5.1 mmol/L 3.9 4.2 4.1  Chloride 98 - 111 mmol/L 109 109 112(H)  CO2 22 -  32 mmol/L 23 22 22  Calcium 8.9 - 10.3 mg/dL 9.0 9.1 8.9  Total Protein 6.5 - 8.1 g/dL 6.4(L) 6.4(L) 6.3(L)  Total Bilirubin 0.3 - 1.2 mg/dL 0.5 0.4 0.4  Alkaline Phos 38 - 126 U/L 104 62 83  AST 15 - 41 U/L 15 17 14(L)  ALT 0 - 44 U/L 11 16 12      RADIOGRAPHIC STUDIES: I have personally reviewed the radiological images as listed and agreed with the findings in the report. No results found.   ASSESSMENT & PLAN:  Brandon Sherman is a 74 y.o. male with    1.Intrahepatic cholangiocarcinoma, cT2N0Mx,unresectable,with indeterminate lung nodules -He was diagnosed in 07/2019.CT scans and MRI liver showa large7.3cm mass in the right hepatic lobe whichabutsportal vein.  -He was seen byour local surgeon Dr. Byerly andDr Clark at [facility],both concluded that cancer is not resectable due to the invasion to portal vein. -I discussed given his cancer is non-resectable his cancer is likely not curable but still treatable. Istarted him onstandardfirst line chemo with IV Cisplatin and Gemcitabine 2 weeks on/1 weekoff  beginning 09/29/19. -His FO results showed MSI stable disease, IDH 1 mutation,so he may benefit from IDH inhibitor in the future. -He had PAC placed on 02/23/20. With procedure he was seen to have Afib. I will refer him to Cardiologist Dr. Berry (his wife's cardiologist) to be managed.  -He continues to tolerate treatment well. Labs reviewed, WBC 18.8, Hg 8.7, ANC 15.1. Overall adequate to proceed with C8D1 Gem/Cis today.  -Plan for next restaging CT in May  -F/u in 3 weeks    2. Nausea, Low food intakeand weight loss, Epigastric pain  -With pain from GERD and nausea he initially lost 30 pounds. -His nausea and GERD is based on what he eats. Controlled onPhenergan, Sucralfate and Pepcid. -Hewaspreviouslyseen by Dietician. -No current nausea or vomiting on chemo and using less antiemetics.For his mild constipation he can continue OTC stool softeners.His weight is stable as he is eating adequately. -He has no more nausea and able to gain weight.  -He notes intermittent epigastric pain (S/p C7). This improves with Sucralfate. He will also continue antiacid.    3. DM, HTN, HLD, Gout, LE edema -On Metformin, amlodipine, lisinopril., allopurinol. Continue to f/u with his PCP  -He does have LE edema which he attributes to Gout. I previously added low dose IV lasix to infusions given crackling of lungs on 01/19/20 exam.   4. New onset AF -With PAC placement on 02/23/20 he was seen to have Afib. Based on CHADS2 Score 2. She is at moderate risk for stroke. I reviewed this with ot. I will refer him to Cardiologist to be managed and see if he needs anticoagulation. He is currently on baby Aspirin daily.  -He has Afib and mild tachycardia on exam today (03/01/20). I will start him on Metoprolol 25mg daily today until he can be seen by cardiologist. He should check Heartbeat daily, goal 60-90.   5. Nicotine Use -He never smoked but has been chewing Tobacco for the past 60 years.He no longer  drinks alcohol. -He now chews 1-2 times a day.Iagainencouraged him toreduce and quit completely.  6.GERDand gastritis, history ofesophageal candidiasis -Hehad repeated EGDwith Dr. [name] in 07/2019. 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  -Will monitor andmayrepeat EGDin future   PLAN: -I called in Metoprolol today  -Send cardiology referral.  -Labs reviewed and adequate to   proceed with C8D1Cis/Gem today, Fulphila injectionon day 9, will continue lasix 10mg iv to chemo  -Lab andchemo Gem/Cis in1,3, 4 weeks  -F/u in 3 weeks   No problem-specific Assessment & Plan notes found for this encounter.   No orders of the defined types were placed in this encounter.  All questions were answered. The patient knows to call the clinic with any problems, questions or concerns. No barriers to learning was detected. The total time spent in the appointment was 30 minutes.     Yan Feng, MD 03/01/2020   I, Amoya Bennett, am acting as scribe for Yan Feng, MD.   I have reviewed the above documentation for accuracy and completeness, and I agree with the above.       

## 2020-03-01 ENCOUNTER — Other Ambulatory Visit: Payer: Self-pay

## 2020-03-01 ENCOUNTER — Inpatient Hospital Stay: Payer: Medicare HMO | Attending: Hematology

## 2020-03-01 ENCOUNTER — Inpatient Hospital Stay: Payer: Medicare HMO

## 2020-03-01 ENCOUNTER — Inpatient Hospital Stay: Payer: Medicare HMO | Admitting: Hematology

## 2020-03-01 ENCOUNTER — Encounter: Payer: Self-pay | Admitting: Hematology

## 2020-03-01 VITALS — BP 162/91 | HR 97 | Temp 98.3°F | Resp 20 | Ht 69.0 in | Wt 232.6 lb

## 2020-03-01 DIAGNOSIS — Z5111 Encounter for antineoplastic chemotherapy: Secondary | ICD-10-CM | POA: Diagnosis not present

## 2020-03-01 DIAGNOSIS — D6481 Anemia due to antineoplastic chemotherapy: Secondary | ICD-10-CM | POA: Diagnosis not present

## 2020-03-01 DIAGNOSIS — C221 Intrahepatic bile duct carcinoma: Secondary | ICD-10-CM

## 2020-03-01 DIAGNOSIS — Z95828 Presence of other vascular implants and grafts: Secondary | ICD-10-CM

## 2020-03-01 DIAGNOSIS — I4891 Unspecified atrial fibrillation: Secondary | ICD-10-CM | POA: Diagnosis not present

## 2020-03-01 DIAGNOSIS — Z7189 Other specified counseling: Secondary | ICD-10-CM

## 2020-03-01 LAB — CMP (CANCER CENTER ONLY)
ALT: 11 U/L (ref 0–44)
AST: 15 U/L (ref 15–41)
Albumin: 3.7 g/dL (ref 3.5–5.0)
Alkaline Phosphatase: 104 U/L (ref 38–126)
Anion gap: 12 (ref 5–15)
BUN: 17 mg/dL (ref 8–23)
CO2: 23 mmol/L (ref 22–32)
Calcium: 9 mg/dL (ref 8.9–10.3)
Chloride: 109 mmol/L (ref 98–111)
Creatinine: 1.33 mg/dL — ABNORMAL HIGH (ref 0.61–1.24)
GFR, Est AFR Am: >60 mL/min (ref >60)
GFR, Estimated: 53 mL/min — ABNORMAL LOW (ref >60)
Glucose, Bld: 114 mg/dL — ABNORMAL HIGH (ref 70–99)
Potassium: 3.9 mmol/L (ref 3.5–5.1)
Sodium: 144 mmol/L (ref 135–145)
Total Bilirubin: 0.5 mg/dL (ref 0.3–1.2)
Total Protein: 6.4 g/dL — ABNORMAL LOW (ref 6.5–8.1)

## 2020-03-01 LAB — CBC WITH DIFFERENTIAL (CANCER CENTER ONLY)
Abs Immature Granulocytes: 0.57 10*3/uL — ABNORMAL HIGH (ref 0.00–0.07)
Basophils Absolute: 0.1 10*3/uL (ref 0.0–0.1)
Basophils Relative: 0 %
Eosinophils Absolute: 0.1 10*3/uL (ref 0.0–0.5)
Eosinophils Relative: 1 %
HCT: 27.3 % — ABNORMAL LOW (ref 39.0–52.0)
Hemoglobin: 8.7 g/dL — ABNORMAL LOW (ref 13.0–17.0)
Immature Granulocytes: 3 %
Lymphocytes Relative: 10 %
Lymphs Abs: 1.8 10*3/uL (ref 0.7–4.0)
MCH: 34.8 pg — ABNORMAL HIGH (ref 26.0–34.0)
MCHC: 31.9 g/dL (ref 30.0–36.0)
MCV: 109.2 fL — ABNORMAL HIGH (ref 80.0–100.0)
Monocytes Absolute: 1.2 10*3/uL — ABNORMAL HIGH (ref 0.1–1.0)
Monocytes Relative: 6 %
Neutro Abs: 15.1 10*3/uL — ABNORMAL HIGH (ref 1.7–7.7)
Neutrophils Relative %: 80 %
Platelet Count: 193 10*3/uL (ref 150–400)
RBC: 2.5 MIL/uL — ABNORMAL LOW (ref 4.22–5.81)
RDW: 18.7 % — ABNORMAL HIGH (ref 11.5–15.5)
WBC Count: 18.8 10*3/uL — ABNORMAL HIGH (ref 4.0–10.5)
nRBC: 0.4 % — ABNORMAL HIGH (ref 0.0–0.2)

## 2020-03-01 MED ORDER — SODIUM CHLORIDE 0.9 % IV SOLN
1000.0000 mg/m2 | Freq: Once | INTRAVENOUS | Status: AC
  Administered 2020-03-01: 2166 mg via INTRAVENOUS
  Filled 2020-03-01: qty 56.97

## 2020-03-01 MED ORDER — PALONOSETRON HCL INJECTION 0.25 MG/5ML
INTRAVENOUS | Status: AC
  Filled 2020-03-01: qty 5

## 2020-03-01 MED ORDER — SODIUM CHLORIDE 0.9 % IV SOLN
Freq: Once | INTRAVENOUS | Status: AC
  Filled 2020-03-01: qty 5

## 2020-03-01 MED ORDER — SODIUM CHLORIDE 0.9 % IV SOLN
Freq: Once | INTRAVENOUS | Status: AC
  Filled 2020-03-01: qty 250

## 2020-03-01 MED ORDER — SODIUM CHLORIDE 0.9 % IV SOLN
25.0000 mg/m2 | Freq: Once | INTRAVENOUS | Status: AC
  Administered 2020-03-01: 54 mg via INTRAVENOUS
  Filled 2020-03-01: qty 54

## 2020-03-01 MED ORDER — FUROSEMIDE 10 MG/ML IJ SOLN
INTRAMUSCULAR | Status: AC
  Filled 2020-03-01: qty 2

## 2020-03-01 MED ORDER — FUROSEMIDE 10 MG/ML IJ SOLN
10.0000 mg | Freq: Once | INTRAMUSCULAR | Status: AC
  Administered 2020-03-01: 10 mg via INTRAVENOUS

## 2020-03-01 MED ORDER — HEPARIN SOD (PORK) LOCK FLUSH 100 UNIT/ML IV SOLN
500.0000 [IU] | Freq: Once | INTRAVENOUS | Status: AC | PRN
  Administered 2020-03-01: 500 [IU]
  Filled 2020-03-01: qty 5

## 2020-03-01 MED ORDER — POTASSIUM CHLORIDE 2 MEQ/ML IV SOLN
Freq: Once | INTRAVENOUS | Status: AC
  Filled 2020-03-01: qty 10

## 2020-03-01 MED ORDER — SODIUM CHLORIDE 0.9% FLUSH
10.0000 mL | INTRAVENOUS | Status: DC | PRN
  Administered 2020-03-01: 10 mL
  Filled 2020-03-01: qty 10

## 2020-03-01 MED ORDER — METOPROLOL SUCCINATE ER 25 MG PO TB24: 25.0000 mg | ORAL_TABLET | Freq: Every day | ORAL | 0 refills | Status: DC

## 2020-03-01 MED ORDER — SODIUM CHLORIDE 0.9% FLUSH
10.0000 mL | Freq: Once | INTRAVENOUS | Status: AC
  Administered 2020-03-01: 10 mL
  Filled 2020-03-01: qty 10

## 2020-03-01 MED ORDER — PALONOSETRON HCL INJECTION 0.25 MG/5ML
0.2500 mg | Freq: Once | INTRAVENOUS | Status: AC
  Administered 2020-03-01: 0.25 mg via INTRAVENOUS

## 2020-03-02 NOTE — Progress Notes (Signed)
Pharmacist Chemotherapy Monitoring - Follow Up Assessment    I verify that I have reviewed each item in the below checklist:  . Regimen for the patient is scheduled for the appropriate day and plan matches scheduled date. Marland Kitchen Appropriate non-routine labs are ordered dependent on drug ordered. . If applicable, additional medications reviewed and ordered per protocol based on lifetime cumulative doses and/or treatment regimen.   Plan for follow-up and/or issues identified: No . I-vent associated with next due treatment: No . MD and/or nursing notified: No  Brandon Sherman K 03/02/2020 9:24 AM

## 2020-03-03 DIAGNOSIS — C221 Intrahepatic bile duct carcinoma: Secondary | ICD-10-CM | POA: Diagnosis not present

## 2020-03-03 DIAGNOSIS — I10 Essential (primary) hypertension: Secondary | ICD-10-CM | POA: Diagnosis not present

## 2020-03-03 DIAGNOSIS — E559 Vitamin D deficiency, unspecified: Secondary | ICD-10-CM | POA: Diagnosis not present

## 2020-03-03 DIAGNOSIS — R972 Elevated prostate specific antigen [PSA]: Secondary | ICD-10-CM | POA: Diagnosis not present

## 2020-03-03 DIAGNOSIS — I129 Hypertensive chronic kidney disease with stage 1 through stage 4 chronic kidney disease, or unspecified chronic kidney disease: Secondary | ICD-10-CM | POA: Diagnosis not present

## 2020-03-03 DIAGNOSIS — N182 Chronic kidney disease, stage 2 (mild): Secondary | ICD-10-CM | POA: Diagnosis not present

## 2020-03-03 DIAGNOSIS — M109 Gout, unspecified: Secondary | ICD-10-CM | POA: Diagnosis not present

## 2020-03-03 DIAGNOSIS — E785 Hyperlipidemia, unspecified: Secondary | ICD-10-CM | POA: Diagnosis not present

## 2020-03-03 DIAGNOSIS — E1122 Type 2 diabetes mellitus with diabetic chronic kidney disease: Secondary | ICD-10-CM | POA: Diagnosis not present

## 2020-03-04 ENCOUNTER — Other Ambulatory Visit: Payer: Self-pay | Admitting: Nurse Practitioner

## 2020-03-08 ENCOUNTER — Inpatient Hospital Stay: Payer: Medicare HMO

## 2020-03-08 ENCOUNTER — Other Ambulatory Visit: Payer: Self-pay

## 2020-03-08 VITALS — BP 166/93 | HR 59 | Temp 98.7°F | Resp 18

## 2020-03-08 DIAGNOSIS — Z95828 Presence of other vascular implants and grafts: Secondary | ICD-10-CM

## 2020-03-08 DIAGNOSIS — C221 Intrahepatic bile duct carcinoma: Secondary | ICD-10-CM

## 2020-03-08 DIAGNOSIS — I4891 Unspecified atrial fibrillation: Secondary | ICD-10-CM | POA: Diagnosis not present

## 2020-03-08 DIAGNOSIS — Z5111 Encounter for antineoplastic chemotherapy: Secondary | ICD-10-CM | POA: Diagnosis not present

## 2020-03-08 DIAGNOSIS — Z7189 Other specified counseling: Secondary | ICD-10-CM

## 2020-03-08 DIAGNOSIS — M109 Gout, unspecified: Secondary | ICD-10-CM | POA: Diagnosis not present

## 2020-03-08 DIAGNOSIS — E785 Hyperlipidemia, unspecified: Secondary | ICD-10-CM | POA: Diagnosis not present

## 2020-03-08 DIAGNOSIS — E1122 Type 2 diabetes mellitus with diabetic chronic kidney disease: Secondary | ICD-10-CM | POA: Diagnosis not present

## 2020-03-08 DIAGNOSIS — D6481 Anemia due to antineoplastic chemotherapy: Secondary | ICD-10-CM | POA: Diagnosis not present

## 2020-03-08 LAB — CBC WITH DIFFERENTIAL (CANCER CENTER ONLY)
Abs Immature Granulocytes: 0.21 10*3/uL — ABNORMAL HIGH (ref 0.00–0.07)
Basophils Absolute: 0.1 10*3/uL (ref 0.0–0.1)
Basophils Relative: 1 %
Eosinophils Absolute: 0 10*3/uL (ref 0.0–0.5)
Eosinophils Relative: 0 %
HCT: 25 % — ABNORMAL LOW (ref 39.0–52.0)
Hemoglobin: 8.2 g/dL — ABNORMAL LOW (ref 13.0–17.0)
Immature Granulocytes: 2 %
Lymphocytes Relative: 12 %
Lymphs Abs: 1.4 10*3/uL (ref 0.7–4.0)
MCH: 35.2 pg — ABNORMAL HIGH (ref 26.0–34.0)
MCHC: 32.8 g/dL (ref 30.0–36.0)
MCV: 107.3 fL — ABNORMAL HIGH (ref 80.0–100.0)
Monocytes Absolute: 0.8 10*3/uL (ref 0.1–1.0)
Monocytes Relative: 7 %
Neutro Abs: 8.9 10*3/uL — ABNORMAL HIGH (ref 1.7–7.7)
Neutrophils Relative %: 78 %
Platelet Count: 190 10*3/uL (ref 150–400)
RBC: 2.33 MIL/uL — ABNORMAL LOW (ref 4.22–5.81)
RDW: 16.6 % — ABNORMAL HIGH (ref 11.5–15.5)
WBC Count: 11.5 10*3/uL — ABNORMAL HIGH (ref 4.0–10.5)
nRBC: 0.5 % — ABNORMAL HIGH (ref 0.0–0.2)

## 2020-03-08 LAB — CMP (CANCER CENTER ONLY)
ALT: 19 U/L (ref 0–44)
AST: 21 U/L (ref 15–41)
Albumin: 3.6 g/dL (ref 3.5–5.0)
Alkaline Phosphatase: 76 U/L (ref 38–126)
Anion gap: 8 (ref 5–15)
BUN: 22 mg/dL (ref 8–23)
CO2: 20 mmol/L — ABNORMAL LOW (ref 22–32)
Calcium: 8.9 mg/dL (ref 8.9–10.3)
Chloride: 109 mmol/L (ref 98–111)
Creatinine: 1.42 mg/dL — ABNORMAL HIGH (ref 0.61–1.24)
GFR, Est AFR Am: 57 mL/min — ABNORMAL LOW (ref >60)
GFR, Estimated: 49 mL/min — ABNORMAL LOW (ref >60)
Glucose, Bld: 108 mg/dL — ABNORMAL HIGH (ref 70–99)
Potassium: 4.2 mmol/L (ref 3.5–5.1)
Sodium: 137 mmol/L (ref 135–145)
Total Bilirubin: 0.6 mg/dL (ref 0.3–1.2)
Total Protein: 6.5 g/dL (ref 6.5–8.1)

## 2020-03-08 MED ORDER — SODIUM CHLORIDE 0.9 % IV SOLN
150.0000 mg | Freq: Once | INTRAVENOUS | Status: AC
  Administered 2020-03-08: 12:00:00 150 mg via INTRAVENOUS
  Filled 2020-03-08: qty 150

## 2020-03-08 MED ORDER — SODIUM CHLORIDE 0.9 % IV SOLN
Freq: Once | INTRAVENOUS | Status: AC
  Filled 2020-03-08: qty 250

## 2020-03-08 MED ORDER — SODIUM CHLORIDE 0.9 % IV SOLN
10.0000 mg | Freq: Once | INTRAVENOUS | Status: AC
  Administered 2020-03-08: 12:00:00 10 mg via INTRAVENOUS
  Filled 2020-03-08: qty 10

## 2020-03-08 MED ORDER — FUROSEMIDE 10 MG/ML IJ SOLN
INTRAMUSCULAR | Status: AC
  Filled 2020-03-08: qty 2

## 2020-03-08 MED ORDER — SODIUM CHLORIDE 0.9 % IV SOLN
25.0000 mg/m2 | Freq: Once | INTRAVENOUS | Status: AC
  Administered 2020-03-08: 54 mg via INTRAVENOUS
  Filled 2020-03-08: qty 54

## 2020-03-08 MED ORDER — SODIUM CHLORIDE 0.9 % IV SOLN
1000.0000 mg/m2 | Freq: Once | INTRAVENOUS | Status: AC
  Administered 2020-03-08: 2166 mg via INTRAVENOUS
  Filled 2020-03-08: qty 56.97

## 2020-03-08 MED ORDER — HEPARIN SOD (PORK) LOCK FLUSH 100 UNIT/ML IV SOLN
500.0000 [IU] | Freq: Once | INTRAVENOUS | Status: AC | PRN
  Administered 2020-03-08: 500 [IU]
  Filled 2020-03-08: qty 5

## 2020-03-08 MED ORDER — POTASSIUM CHLORIDE 2 MEQ/ML IV SOLN
Freq: Once | INTRAVENOUS | Status: AC
  Filled 2020-03-08: qty 10

## 2020-03-08 MED ORDER — SODIUM CHLORIDE 0.9% FLUSH
10.0000 mL | Freq: Once | INTRAVENOUS | Status: AC
  Administered 2020-03-08: 10 mL
  Filled 2020-03-08: qty 10

## 2020-03-08 MED ORDER — SODIUM CHLORIDE 0.9% FLUSH
10.0000 mL | INTRAVENOUS | Status: DC | PRN
  Administered 2020-03-08: 10 mL
  Filled 2020-03-08: qty 10

## 2020-03-08 MED ORDER — FUROSEMIDE 10 MG/ML IJ SOLN
10.0000 mg | Freq: Once | INTRAMUSCULAR | Status: AC
  Administered 2020-03-08: 10 mg via INTRAVENOUS

## 2020-03-08 MED ORDER — PALONOSETRON HCL INJECTION 0.25 MG/5ML
INTRAVENOUS | Status: AC
  Filled 2020-03-08: qty 5

## 2020-03-08 MED ORDER — PALONOSETRON HCL INJECTION 0.25 MG/5ML
0.2500 mg | Freq: Once | INTRAVENOUS | Status: AC
  Administered 2020-03-08: 0.25 mg via INTRAVENOUS

## 2020-03-08 NOTE — Patient Instructions (Signed)
[facility] Discharge Instructions for Patients Receiving Chemotherapy  Today you received the following chemotherapy agents: gemcitabine and cisplatin.  To help prevent nausea and vomiting after your treatment, we encourage you to take your nausea medication as directed.   If you develop nausea and vomiting that is not controlled by your nausea medication, call the clinic.   BELOW ARE SYMPTOMS THAT SHOULD BE REPORTED IMMEDIATELY:  *FEVER GREATER THAN 100.5 F  *CHILLS WITH OR WITHOUT FEVER  NAUSEA AND VOMITING THAT IS NOT CONTROLLED WITH YOUR NAUSEA MEDICATION  *UNUSUAL SHORTNESS OF BREATH  *UNUSUAL BRUISING OR BLEEDING  TENDERNESS IN MOUTH AND THROAT WITH OR WITHOUT PRESENCE OF ULCERS  *URINARY PROBLEMS  *BOWEL PROBLEMS  UNUSUAL RASH Items with * indicate a potential emergency and should be followed up as soon as possible.  Feel free to call the clinic should you have any questions or concerns. The clinic phone number is [phone].  Please show the CHEMO ALERT CARD at check-in to the Emergency Department and triage nurse.   

## 2020-03-10 ENCOUNTER — Inpatient Hospital Stay: Payer: Medicare HMO

## 2020-03-15 ENCOUNTER — Encounter (HOSPITAL_COMMUNITY): Payer: Self-pay | Admitting: Emergency Medicine

## 2020-03-15 ENCOUNTER — Ambulatory Visit: Payer: Medicare HMO

## 2020-03-15 ENCOUNTER — Emergency Department (HOSPITAL_COMMUNITY)
Admission: EM | Admit: 2020-03-15 | Discharge: 2020-03-15 | Disposition: A | Discharge status: Home / Self Care | Payer: Medicare HMO | Attending: Emergency Medicine | Admitting: Emergency Medicine

## 2020-03-15 ENCOUNTER — Other Ambulatory Visit: Payer: Self-pay

## 2020-03-15 DIAGNOSIS — E119 Type 2 diabetes mellitus without complications: Secondary | ICD-10-CM | POA: Diagnosis not present

## 2020-03-15 DIAGNOSIS — Z7984 Long term (current) use of oral hypoglycemic drugs: Secondary | ICD-10-CM | POA: Insufficient documentation

## 2020-03-15 DIAGNOSIS — Z79899 Other long term (current) drug therapy: Secondary | ICD-10-CM | POA: Diagnosis not present

## 2020-03-15 DIAGNOSIS — I10 Essential (primary) hypertension: Secondary | ICD-10-CM | POA: Insufficient documentation

## 2020-03-15 DIAGNOSIS — D649 Anemia, unspecified: Secondary | ICD-10-CM

## 2020-03-15 DIAGNOSIS — Z7982 Long term (current) use of aspirin: Secondary | ICD-10-CM | POA: Insufficient documentation

## 2020-03-15 DIAGNOSIS — F17220 Nicotine dependence, chewing tobacco, uncomplicated: Secondary | ICD-10-CM | POA: Insufficient documentation

## 2020-03-15 DIAGNOSIS — I4891 Unspecified atrial fibrillation: Secondary | ICD-10-CM | POA: Diagnosis not present

## 2020-03-15 LAB — COMPREHENSIVE METABOLIC PANEL
ALT: 18 U/L (ref 0–44)
AST: 22 U/L (ref 15–41)
Albumin: 3.8 g/dL (ref 3.5–5.0)
Alkaline Phosphatase: 53 U/L (ref 38–126)
Anion gap: 8 (ref 5–15)
BUN: 26 mg/dL — ABNORMAL HIGH (ref 8–23)
CO2: 23 mmol/L (ref 22–32)
Calcium: 8.7 mg/dL — ABNORMAL LOW (ref 8.9–10.3)
Chloride: 107 mmol/L (ref 98–111)
Creatinine, Ser: 1.46 mg/dL — ABNORMAL HIGH (ref 0.61–1.24)
GFR calc Af Amer: 55 mL/min — ABNORMAL LOW (ref >60)
GFR calc non Af Amer: 47 mL/min — ABNORMAL LOW (ref >60)
Glucose, Bld: 89 mg/dL (ref 70–99)
Potassium: 4.2 mmol/L (ref 3.5–5.1)
Sodium: 138 mmol/L (ref 135–145)
Total Bilirubin: 0.9 mg/dL (ref 0.3–1.2)
Total Protein: 6.5 g/dL (ref 6.5–8.1)

## 2020-03-15 LAB — CBC WITH DIFFERENTIAL/PLATELET
Abs Immature Granulocytes: 0.02 10*3/uL (ref 0.00–0.07)
Basophils Absolute: 0 10*3/uL (ref 0.0–0.1)
Basophils Relative: 1 %
Eosinophils Absolute: 0 10*3/uL (ref 0.0–0.5)
Eosinophils Relative: 0 %
HCT: 23.7 % — ABNORMAL LOW (ref 39.0–52.0)
Hemoglobin: 7.7 g/dL — ABNORMAL LOW (ref 13.0–17.0)
Immature Granulocytes: 1 %
Lymphocytes Relative: 31 %
Lymphs Abs: 1.2 10*3/uL (ref 0.7–4.0)
MCH: 35.6 pg — ABNORMAL HIGH (ref 26.0–34.0)
MCHC: 32.5 g/dL (ref 30.0–36.0)
MCV: 109.7 fL — ABNORMAL HIGH (ref 80.0–100.0)
Monocytes Absolute: 0.3 10*3/uL (ref 0.1–1.0)
Monocytes Relative: 8 %
Neutro Abs: 2.2 10*3/uL (ref 1.7–7.7)
Neutrophils Relative %: 59 %
Platelets: 62 10*3/uL — ABNORMAL LOW (ref 150–400)
RBC: 2.16 MIL/uL — ABNORMAL LOW (ref 4.22–5.81)
RDW: 16.4 % — ABNORMAL HIGH (ref 11.5–15.5)
WBC: 3.8 10*3/uL — ABNORMAL LOW (ref 4.0–10.5)
nRBC: 0.8 % — ABNORMAL HIGH (ref 0.0–0.2)

## 2020-03-15 LAB — URINALYSIS, ROUTINE W REFLEX MICROSCOPIC
Bacteria, UA: NONE SEEN
Bilirubin Urine: NEGATIVE
Glucose, UA: NEGATIVE mg/dL
Ketones, ur: NEGATIVE mg/dL
Leukocytes,Ua: NEGATIVE
Nitrite: NEGATIVE
Protein, ur: NEGATIVE mg/dL
Specific Gravity, Urine: 1.01 (ref 1.005–1.030)
pH: 7 (ref 5.0–8.0)

## 2020-03-15 LAB — LIPASE, BLOOD: Lipase: 28 U/L (ref 11–51)

## 2020-03-15 LAB — TYPE AND SCREEN
ABO/RH(D): B POS
Antibody Screen: NEGATIVE

## 2020-03-15 LAB — POC OCCULT BLOOD, ED: Fecal Occult Bld: NEGATIVE

## 2020-03-15 MED ORDER — HEPARIN SOD (PORK) LOCK FLUSH 100 UNIT/ML IV SOLN
500.0000 [IU] | Freq: Once | INTRAVENOUS | Status: AC
  Administered 2020-03-15: 500 [IU]
  Filled 2020-03-15: qty 5

## 2020-03-15 MED ORDER — FERROUS SULFATE 325 (65 FE) MG PO TABS
325.0000 mg | ORAL_TABLET | Freq: Every day | ORAL | 0 refills | Status: DC
Start: 2020-03-15 — End: 2020-04-27

## 2020-03-15 NOTE — ED Provider Notes (Signed)
[facility] DEPT Provider Note   CSN: [id number] Arrival date & time: 03/15/20  1744     History Chief Complaint  Patient presents with  . Abnormal Lab    Brandon Sherman is a 74 y.o. male history of diabetes, hypertension, hyperlipidemia, cholangiocarcinoma, here presenting with anemia. Patient states that his legs does not get him now and he has been feeling weaker.  He went to see his primary care doctor and had a CBC done today and showed a hemoglobin of 8.  At baseline, his hemoglobin is somewhere between 8-9.  Patient denies any blood in the stool.  Patient states that he is not on any blood thinners.  The history is provided by the patient.       Past Medical History:  Diagnosis Date  . Arthritis   . Diabetes ([place])   . GERD (gastroesophageal reflux disease)   . Hyperlipidemia   . Hypertension   . 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 Port-A-Cath in place 03/01/2020  . Goals of care, counseling/discussion 09/22/2019  . 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 Abdominal pain, epigastric 07/16/2019  . Nausea and vomiting 07/16/2019  . Abdominal pain 07/16/2019  . Loss of weight 07/16/2019  . HYPERLIPIDEMIA 07/12/2009  . GOUT 07/12/2009  . HYPERTENSION 07/12/2009  . ARTHRITIS 07/12/2009    Past Surgical History:  Procedure Laterality Date  . APPENDECTOMY  1980  . COLONOSCOPY    . IR IMAGING GUIDED PORT INSERTION  02/23/2020       Family History  Problem Relation Age of Onset  . Heart attack Mother   . Stroke Father   . Colon cancer Neg Hx   . Esophageal cancer Neg Hx   . Stomach cancer Neg Hx   . Rectal cancer Neg Hx   . Colon polyps Neg Hx     Social History   Tobacco Use  . Smoking status: Never Smoker  . Smokeless tobacco: Current User    Types: Chew  . Tobacco comment: for 60 years, 1-2 daily   Substance Use Topics  . Alcohol use: Not  Currently    Comment: occasionally  . Drug use: Never    Home Medications Prior to Admission medications   Medication Sig Start Date End Date Taking? Authorizing Provider  allopurinol (ZYLOPRIM) 100 MG tablet Take 100 mg by mouth daily.    [provider]  amLODipine (NORVASC) 2.5 MG tablet Take 2.5 mg by mouth daily.    [provider]  aspirin EC 81 MG tablet Take 81 mg by mouth daily.    [provider]  famotidine (PEPCID) 40 MG tablet Take 0.5 tablets (20 mg total) by mouth 2 (two) times daily. 04/29/19   [name], [name], MD  fenofibrate (TRICOR) 145 MG tablet Take 145 mg by mouth daily.    [provider]  finasteride (PROSCAR) 5 MG tablet Take 5 mg by mouth daily.    [provider]  lisinopril (ZESTRIL) 40 MG tablet Take 40 mg by mouth daily.    [provider]  magnesium oxide (MAG-OX) 400 MG tablet TAKE 1 TABLET BY MOUTH EVERY DAY 03/08/20   Alla Feeling, NP  metFORMIN (GLUCOPHAGE) 500 MG tablet Take 500 mg by mouth 2 (two) times daily with a meal.     [provider]  metoprolol succinate (TOPROL XL) 25 MG 24 hr tablet Take 1 tablet (25 mg total)  by mouth daily. 03/01/20   [name], MD  ondansetron (ZOFRAN) 4 MG tablet Take 4 mg by mouth every 8 (eight) hours as needed for nausea or vomiting.    [provider]  ondansetron (ZOFRAN) 8 MG tablet Take 1 tablet (8 mg total) by mouth 2 (two) times daily as needed. Start on the third day after chemotherapy. 09/22/19   [name], MD  pantoprazole (PROTONIX) 40 MG tablet TAKE 1 TABLET BY MOUTH TWICE A DAY 12/08/19   Zehr, Janett Billow D, PA-C  pravastatin (PRAVACHOL) 40 MG tablet Take 40 mg by mouth daily.    [provider]  prochlorperazine (COMPAZINE) 10 MG tablet Take 1 tablet (10 mg total) by mouth every 6 (six) hours as needed (Nausea or vomiting). 09/22/19   [name], MD  promethazine (PHENERGAN) 12.5 MG tablet TAKE 1 TABLET BY MOUTH EVERY 6 HOURS AS  NEEDED FOR NAUSEA OR VOMITING. 09/16/19   Zehr, Janett Billow D, PA-C  sucralfate (CARAFATE) 1 g tablet Take 1 tablet (1 g total) by mouth every 6 (six) hours as needed. 01/01/20   [name], [name], MD  tamsulosin (FLOMAX) 0.4 MG CAPS capsule Take 0.4 mg by mouth daily.    [provider]  Vitamin D, Ergocalciferol, (DRISDOL) 1.25 MG (50000 UT) CAPS capsule Take 50,000 Units by mouth every 7 (seven) days.    [provider]    Allergies    Patient has no known allergies.  Review of Systems   Review of Systems  Neurological: Positive for weakness.  All other systems reviewed and are negative.   Physical Exam Updated Vital Signs BP (!) 148/85   Pulse 88   Temp 98.8 F (37.1 C) (Oral)   Resp (!) 21   Ht 5\' 9"  (1.753 m)   Wt 106.6 kg   SpO2 93%   BMI 34.70 kg/m   Physical Exam Vitals and nursing note reviewed.  Constitutional:      Comments: Chronically ill, slightly pale   HENT:     Head: Normocephalic.     Nose: Nose normal.     Mouth/Throat:     Mouth: Mucous membranes are dry.  Eyes:     Extraocular Movements: Extraocular movements intact.     Pupils: Pupils are equal, round, and reactive to light.     Comments: Conjunctiva slightly pale   Cardiovascular:     Rate and Rhythm: Normal rate and regular rhythm.     Pulses: Normal pulses.     Heart sounds: Normal heart sounds.  Pulmonary:     Effort: Pulmonary effort is normal.  Abdominal:     General: Abdomen is flat.     Palpations: Abdomen is soft.  Genitourinary:    Comments: Rectal- brown stool  Musculoskeletal:        General: Normal range of motion.     Cervical back: Normal range of motion.  Skin:    General: Skin is warm.     Capillary Refill: Capillary refill takes less than 2 seconds.  Neurological:     General: No focal deficit present.     Mental Status: He is alert.  Psychiatric:        Mood and Affect: Mood normal.        Behavior: Behavior normal.     ED Results / Procedures  / Treatments   Labs (all labs ordered are listed, but only abnormal results are displayed) Labs Reviewed  URINALYSIS, ROUTINE W REFLEX MICROSCOPIC - Abnormal; Notable for the following  components:      Result Value   Hgb urine dipstick SMALL (*)    All other components within normal limits  CBC WITH DIFFERENTIAL/PLATELET - Abnormal; Notable for the following components:   WBC 3.8 (*)    RBC 2.16 (*)    Hemoglobin 7.7 (*)    HCT 23.7 (*)    MCV 109.7 (*)    MCH 35.6 (*)    RDW 16.4 (*)    Platelets 62 (*)    nRBC 0.8 (*)    All other components within normal limits  COMPREHENSIVE METABOLIC PANEL - Abnormal; Notable for the following components:   BUN 26 (*)    Creatinine, Ser 1.46 (*)    Calcium 8.7 (*)    GFR calc non Af Amer 47 (*)    GFR calc Af Amer 55 (*)    All other components within normal limits  LIPASE, BLOOD  POC OCCULT BLOOD, ED  TYPE AND SCREEN    EKG EKG Interpretation  Date/Time:  Monday March 15 2020 19:13:27 EDT Ventricular Rate:  95 PR Interval:    QRS Duration: 94 QT Interval:  378 QTC Calculation: 476 R Axis:   2 Text Interpretation: Atrial fibrillation Low voltage, extremity leads RSR' in V1 or V2, right VCD or RVH Borderline prolonged QT interval No significant change since last tracing Confirmed by Wandra Arthurs [phone]) on 03/15/2020 7:25:59 PM   Radiology No results found.  Procedures Procedures (including critical care time)  Medications Ordered in ED Medications - No data to display  ED Course  I have reviewed the triage vital signs and the nursing notes.  Pertinent labs & imaging results that were available during my care of the patient were reviewed by me and considered in my medical decision making (see chart for details).    MDM Rules/Calculators/A&P                      Brandon Sherman is a 74 y.o. male history of cholangiocarcinoma here presenting with weakness and possible anemia.  His hemoglobin at baseline is 8-9.  Apparently his outpatient CBC showed hemoglobin of 8.  Denies any rectal bleeding and is guaiac negative .  Will repeat CBC, CMP, type and screen.  8:26 PM Hemoglobin is 7.7, platelets down to 62. Patient's guaiac is negative Patient is on chemo and likely a side effect of chemo. Will start on iron pills. I told him to get a repeat CBC and oncology office later this week. If his hemoglobin is less than 7, he will need a transfusion. I think he has symptomatic anemia but does not require transfusion currently.    Final Clinical Impression(s) / ED Diagnoses Final diagnoses:  None    Rx / DC Orders ED Discharge Orders    None       Drenda Freeze, MD 03/15/20 2027

## 2020-03-15 NOTE — ED Triage Notes (Signed)
Per pt, states oncologist told him to come to ED due to low Hgb-states he is weak-no blood noted in stool, urine

## 2020-03-15 NOTE — Discharge Instructions (Signed)
Take iron pills as prescribed   Please follow-up with oncology center later this week to repeat a CBC.  Return to ER if you have worsening weakness, abdominal pain, vomiting, fevers, chest pain, shortness of breath.

## 2020-03-16 ENCOUNTER — Telehealth: Payer: Self-pay

## 2020-03-16 ENCOUNTER — Other Ambulatory Visit: Payer: Self-pay | Admitting: Nurse Practitioner

## 2020-03-16 ENCOUNTER — Other Ambulatory Visit: Payer: Self-pay | Admitting: Gastroenterology

## 2020-03-16 ENCOUNTER — Telehealth: Payer: Self-pay | Admitting: Nurse Practitioner

## 2020-03-16 DIAGNOSIS — C221 Intrahepatic bile duct carcinoma: Secondary | ICD-10-CM

## 2020-03-16 LAB — ABO/RH: ABO/RH(D): B POS

## 2020-03-16 NOTE — Telephone Encounter (Signed)
Cira Rue reviewed [facility] messge and ED noted from yesterday.  She requested I call and see if Brandon Sherman would like to come in this am or another day this week for evaluation and possible transfusion.  I called his home number there was no answer.  I left a vm requesting a call back.

## 2020-03-16 NOTE — Telephone Encounter (Signed)
Mrs. Kibbey returned my call.  They are amenible to coming in tomorrow for evaluation.  I sent a scheduling message to schedule lab and ov with Regan Rakers

## 2020-03-16 NOTE — Telephone Encounter (Signed)
Scheduled appt per 4/27 sch msg. Called and spoke with patient. Confirmed appt

## 2020-03-16 NOTE — Progress Notes (Signed)
Brandon Sherman   [phone] Fax:([phone]   Clinic Follow up Note   Patient Care Team: [name], PA as PCP - General (Family Medicine) [name], MD as Consulting Physician (General Surgery) [name], [name], MD as Consulting Physician (Gastroenterology) [name], MD as Consulting Physician (Hematology) 03/17/2020  CHIEF COMPLAINT: f/u ED visit, cholangiocarcinoma   SUMMARY OF ONCOLOGIC HISTORY: Oncology History Overview Note  Cancer Staging Intrahepatic cholangiocarcinoma ([place]) Staging form: Intrahepatic Bile Duct, AJCC 8th Edition - Clinical stage from 08/19/2019: Stage II (cT2, cN0, cM0) - Signed by [name], MD on 08/19/2019    Intrahepatic cholangiocarcinoma ([place])  06/28/2019 Imaging   CT AP W Contrast 06/28/19  IMPRESSION: 1. Heterogeneous hypodensity posteriorly in the right hepatic lobe and potentially extending into the caudate lobe suspicious for a mass. There is felt to be truncation of branches of the portal vein in this vicinity and some narrowing of the hepatic vein, as well as triangular-shaped regions of abnormal hypoenhancement posteriorly in the right hepatic lobe likely representing downstream vascular effects. Cannot exclude malignancy such as cholangiocarcinoma or hepatocellular carcinoma, and follow up hepatic protocol MRI with and without contrast is recommended to further characterize. 2. 4 mm right middle lobe pulmonary nodule is likely benign but may merit surveillance. 3. Cholelithiasis. 4.  Aortic Atherosclerosis (ICD10-I70.0). 5. Prostatomegaly. 6. Mild impingement at L3-4 and L4-5.   07/31/2019 Imaging   MRI Liver 07/31/19 IMPRESSION: 1. 7.3 cm in long axis mass in the right hepatic lobe spanning into the caudate lobe, high suspicion for malignancy such as hepatocellular carcinoma or cholangiocarcinoma. Suspected effacement or occlusion of the right hepatic vein and posterior branches of the right  portal vein. Two smaller tumor nodules along the posterior periphery of the dominant mass. Tissue diagnosis is recommended. 2. No findings of pathologic adenopathy or distant metastatic spread. 3. 9 mm gallstone in the gallbladder. There is mild gallbladder wall thickening which may be from nondistention, correlate clinically in assessing for cholecystitis. 4.  Aortic Atherosclerosis (ICD10-I70.0). 5. Mild diffuse hepatic steatosis.   08/11/2019 Initial Biopsy   DIAGNOSIS: 08/11/19  A. LIVER, RIGHT, BIOPSY:  - Adenocarcinoma.   08/18/2019 Imaging   CT Chest 08/18/19  IMPRESSION: 1. Multiple pulmonary nodules largest at approximately 7 mm in the right lower lobe, nonspecific but concerning given findings in the liver. 2. No signs of definitive metastatic disease, also with mildly enlarged upper abdominal lymph nodes as discussed.   Aortic Atherosclerosis (ICD10-I70.0).   08/19/2019 Initial Diagnosis   Intrahepatic cholangiocarcinoma ([place])   08/19/2019 Cancer Staging   Staging form: Intrahepatic Bile Duct, AJCC 8th Edition - Clinical stage from 08/19/2019: Stage II (cT2, cN0, cM0) - Signed by [name], MD on 08/19/2019   09/29/2019 -  Chemotherapy   Cisplatin and Gemcitabine 2 weeks on/1 week off starting 09/29/19    10/09/2019 Imaging   CT AP IMPRESSION: 1. The dominant right hepatic lobe mass is minimally reduced in size compared to prior exams, currently measuring 6.2 by 5.3 cm, previously 6.2 by 5.6 cm. However, there is a new small hypodense lesion centrally in the right hepatic lobe which is suspicious for a new small focus of tumor. Accordingly this is an overall mixed appearance. 2. Continued hypoenhancement in the liver downstream of the tumor likely attributable to narrowing or occlusion of the right hepatic vein by the tumor. By virtue of its location the tumor wraps around the intrahepatic portion of the IVC. 3. 4 mm right middle lobe pulmonary  nodule, stable compared to  earliest available comparison of 07/18/2019. Surveillance of the patient's pulmonary nodules is recommended. 4. Other imaging findings of potential clinical significance: Coronary atherosclerosis. Cholelithiasis. Prominent stool throughout the colon favors constipation. Moderate prostatomegaly with heterogeneous enhancement of the prostate gland. Lumbar spondylosis and degenerative disc disease causing mild bilateral foraminal impingement at L3-4 and L4-5.   Aortic Atherosclerosis (ICD10-I70.0).   12/24/2019 Imaging   CT CAP W Contrast  IMPRESSION: 1. Right hepatic lobe mass and adjacent right hepatic lobe nodules appear grossly stable. No evidence of distant metastatic disease. 2. Continued stability of small pulmonary nodules. Recommend attention on follow-up. 3. Cholelithiasis. 4. Enlarged prostate. 5. Aortic atherosclerosis (ICD10-I70.0). Coronary artery calcification.     CURRENT THERAPY:  Cisplatin and Gemcitabine 2 weeks on/1 week off starting 09/29/19  INTERVAL HISTORY: Brandon Sherman presents for hospital f/u. He was last seen 03/01/20, he completed cycle 8 cisplatin and gemcitabine on 03/08/20. He developed weakness and went to PCP and was found to have Hgb 8. He was sent to ED where Hgb was 7.7. He was told to f/u with oncology.   Today, he presents with his wife. He has progressive fatigue and exertional dyspnea since last chemo. He has cough he attributes to post nasal drip. He is not very active. Occasionally has to steady himself if he walks far, no overt lightheadedness, dizziness, or headache. Denies fall. Denies bleeding. He is taking anticoagulation and new BP med, can't remember names. Thinks his heart is in rhythm. Denies chest pain. He has swelling in legs, more in left which is his "gout leg." Denies calf pain. Denies n/v/c/d, mucositis, fever, chills, or poor appetite.    MEDICAL HISTORY:  Past Medical History:  Diagnosis Date   Arthritis    Diabetes ([place])     GERD (gastroesophageal reflux disease)    Hyperlipidemia    Hypertension    Intrahepatic cholangiocarcinoma ([place])     SURGICAL HISTORY: Past Surgical History:  Procedure Laterality Date   APPENDECTOMY  1980   COLONOSCOPY     IR IMAGING GUIDED PORT INSERTION  02/23/2020    I have reviewed the social history and family history with the patient and they are unchanged from previous note.  ALLERGIES:  has No Known Allergies.  MEDICATIONS:  Current Outpatient Medications  Medication Sig Dispense Refill   allopurinol (ZYLOPRIM) 100 MG tablet Take 100 mg by mouth daily.     amLODipine (NORVASC) 2.5 MG tablet Take 2.5 mg by mouth daily.     aspirin EC 81 MG tablet Take 81 mg by mouth daily.     famotidine (PEPCID) 40 MG tablet Take 0.5 tablets (20 mg total) by mouth 2 (two) times daily. 30 tablet 5   fenofibrate (TRICOR) 145 MG tablet Take 145 mg by mouth daily.     finasteride (PROSCAR) 5 MG tablet Take 5 mg by mouth daily.     lisinopril (ZESTRIL) 40 MG tablet Take 40 mg by mouth daily.     magnesium oxide (MAG-OX) 400 MG tablet TAKE 1 TABLET BY MOUTH EVERY DAY 30 tablet 0   metFORMIN (GLUCOPHAGE) 500 MG tablet Take 500 mg by mouth 2 (two) times daily with a meal.      metoprolol succinate (TOPROL XL) 25 MG 24 hr tablet Take 1 tablet (25 mg total) by mouth daily. 30 tablet 0   ondansetron (ZOFRAN) 4 MG tablet Take 4 mg by mouth every 8 (eight) hours as needed for nausea or vomiting.  ondansetron (ZOFRAN) 8 MG tablet Take 1 tablet (8 mg total) by mouth 2 (two) times daily as needed. Start on the third day after chemotherapy. 30 tablet 1   pantoprazole (PROTONIX) 40 MG tablet TAKE 1 TABLET BY MOUTH TWICE A DAY 180 tablet 0   pravastatin (PRAVACHOL) 40 MG tablet Take 40 mg by mouth daily.     prochlorperazine (COMPAZINE) 10 MG tablet Take 1 tablet (10 mg total) by mouth every 6 (six) hours as needed (Nausea or vomiting). 30 tablet 1   promethazine (PHENERGAN) 12.5 MG  tablet TAKE 1 TABLET BY MOUTH EVERY 6 HOURS AS NEEDED FOR NAUSEA OR VOMITING. 20 tablet 1   sucralfate (CARAFATE) 1 g tablet Take 1 tablet (1 g total) by mouth every 6 (six) hours as needed. 180 tablet 1   tamsulosin (FLOMAX) 0.4 MG CAPS capsule Take 0.4 mg by mouth daily.     Vitamin D, Ergocalciferol, (DRISDOL) 1.25 MG (50000 UT) CAPS capsule Take 50,000 Units by mouth every 7 (seven) days.     ferrous sulfate 325 (65 FE) MG tablet Take 1 tablet (325 mg total) by mouth daily. 30 tablet 0   No current facility-administered medications for this visit.    PHYSICAL EXAMINATION: ECOG PERFORMANCE STATUS: 2 - Symptomatic, <50% confined to bed  Vitals:   03/17/20 1433  BP: (!) 166/92  Pulse: (!) 110  Resp: 18  Temp: 99.1 F (37.3 C)  SpO2: 93%   Filed Weights   03/17/20 1433  Weight: 229 lb 9.6 oz (104.1 kg)    GENERAL:alert, no distress and comfortable SKIN: no rash, scattered bruising  EYES:  sclera clear OROPHARYNX: no thrush or ulcers LUNGS: clear with normal breathing effort HEART: irregular rhythm c/w Afib, mild L>R LE edema  ABDOMEN: abdomen soft, round, non-tender and normal bowel sounds NEURO: alert & oriented x 3 with fluent speech, normal gait PAC without erythema   LABORATORY DATA:  I have reviewed the data as listed CBC Latest Ref Rng & Units 03/17/2020 03/15/2020 03/08/2020  WBC 4.0 - 10.5 K/uL 6.5 3.8(L) 11.5(H)  Hemoglobin 13.0 - 17.0 g/dL 7.6(L) 7.7(L) 8.2(L)  Hematocrit 39.0 - 52.0 % 23.8(L) 23.7(L) 25.0(L)  Platelets 150 - 400 K/uL 70(L) 62(L) 190     CMP Latest Ref Rng & Units 03/15/2020 03/08/2020 03/01/2020  Glucose 70 - 99 mg/dL 89 108(H) 114(H)  BUN 8 - 23 mg/dL 26(H) 22 17  Creatinine 0.61 - 1.24 mg/dL 1.46(H) 1.42(H) 1.33(H)  Sodium 135 - 145 mmol/L 138 137 144  Potassium 3.5 - 5.1 mmol/L 4.2 4.2 3.9  Chloride 98 - 111 mmol/L 107 109 109  CO2 22 - 32 mmol/L 23 20(L) 23  Calcium 8.9 - 10.3 mg/dL 8.7(L) 8.9 9.0  Total Protein 6.5 - 8.1 g/dL 6.5  6.5 6.4(L)  Total Bilirubin 0.3 - 1.2 mg/dL 0.9 0.6 0.5  Alkaline Phos 38 - 126 U/L 53 76 104  AST 15 - 41 U/L 22 21 15   ALT 0 - 44 U/L 18 19 11       RADIOGRAPHIC STUDIES: I have personally reviewed the radiological images as listed and agreed with the findings in the report. No results found.   ASSESSMENT & PLAN: Brandon Leese Sizemoreis a 74 y.o.malewith     1. Fatigue, exertional dyspnea -progressive since last chemo on 4/19 -he denies bleeding, this is likely secondary to chemo-induced anemia Hgb 7.6 today -ED recommended oral iron, have not checked iron studies lately  -on anticoagulation for new Afib, PLT 70  OK to continue. We reviewed bleeding and fall precautions  -He is symptomatic from his anemia, I am recommending 2 units pRBCs which has been scheduled on 03/18/20 at 8 am. We reviewed potential risks and side effects and he agrees to proceed.   2.Intrahepatic cholangiocarcinoma, cT2N0Mx,unresectable,with indeterminate lung nodules -Diagnosed in9/2020.CT scans and MRI liver showa large7.3cm mass in the right hepatic lobe whichabutsportal vein.Both our local surgeon Dr. Barry Dienes andDr Carlis Abbott at [facility] concluded that cancer is not resectable, and therefore not likely curable,due to the invasion to portal vein. -He beganpalliativesystemic treatment standardfirst line chemo with IV Cisplatin and Gemcitabine 2 weeks on/1 week off.Started 09/29/19, tolerating well -Restaging CT CAP on 12/24/19 was stable, continues current treatment   3. Nausea, Low food intakeand weight loss -With pain from GERD and nausea he initially lost 30 pounds. -followed by dietician -weight fluctuates on treatment, some component of fluid retention in lower extremities -monitoring   4. DM, HTN, HLD, Gout  -On Metformin, amlodipine, lisinopril., allopurinol  -Continue to f/u with his PCP -recently added amlodipine and anticoagulation for newly diagnosed Afib  5. Nicotine Use -He  never smoked but has been chewing Tobacco for the past 60 years.He no longer drinks alcoholand tells me he has quit smoking (11/25/19).  6.GERDand gastritis, history ofesophageal candidiasis -Hehad repeated EGDwith Dr. Havery Moros in 07/2019. 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 Hold aspirin  -F/u and next cycle as scheduled 03/23/20   No problem-specific Assessment & Plan notes found for this encounter.   Orders Placed This Encounter  Procedures   Care order/instruction    Transfuse Parameters    Standing Status:   Future    Standing Expiration Date:   03/17/2021   Informed Consent Details: Physician/Practitioner Attestation; Transcribe to consent form and obtain patient signature    Standing Status:   Future    Standing Expiration Date:   03/17/2021    Order Specific Question:   Physician/Practitioner attestation of informed consent for blood and or blood product transfusion    Answer:   I, the physician/practitioner, attest that I have discussed with the patient the benefits, risks, side effects, alternatives, likelihood of achieving goals and potential problems during recovery for the procedure that I have provided informed consent.    Order Specific Question:   Product(s)    Answer:   All Product(s)   ABO/Rh   All questions were answered. The patient knows to call the clinic with any problems, questions or concerns. No barriers to learning was detected.     Alla Feeling, NP 03/17/20

## 2020-03-17 ENCOUNTER — Inpatient Hospital Stay: Payer: Medicare HMO

## 2020-03-17 ENCOUNTER — Other Ambulatory Visit: Payer: Self-pay | Admitting: *Deleted

## 2020-03-17 ENCOUNTER — Encounter: Payer: Self-pay | Admitting: Nurse Practitioner

## 2020-03-17 ENCOUNTER — Inpatient Hospital Stay (HOSPITAL_BASED_OUTPATIENT_CLINIC_OR_DEPARTMENT_OTHER): Payer: Medicare HMO | Admitting: Nurse Practitioner

## 2020-03-17 ENCOUNTER — Telehealth: Payer: Self-pay

## 2020-03-17 ENCOUNTER — Other Ambulatory Visit: Payer: Self-pay

## 2020-03-17 VITALS — BP 166/92 | HR 110 | Temp 99.1°F | Resp 18 | Ht 69.0 in | Wt 229.6 lb

## 2020-03-17 DIAGNOSIS — C221 Intrahepatic bile duct carcinoma: Secondary | ICD-10-CM

## 2020-03-17 DIAGNOSIS — Z95828 Presence of other vascular implants and grafts: Secondary | ICD-10-CM

## 2020-03-17 DIAGNOSIS — Z5111 Encounter for antineoplastic chemotherapy: Secondary | ICD-10-CM | POA: Diagnosis not present

## 2020-03-17 DIAGNOSIS — D6481 Anemia due to antineoplastic chemotherapy: Secondary | ICD-10-CM | POA: Diagnosis not present

## 2020-03-17 DIAGNOSIS — I4891 Unspecified atrial fibrillation: Secondary | ICD-10-CM | POA: Diagnosis not present

## 2020-03-17 DIAGNOSIS — D649 Anemia, unspecified: Secondary | ICD-10-CM

## 2020-03-17 LAB — CBC WITH DIFFERENTIAL (CANCER CENTER ONLY)
Abs Immature Granulocytes: 0.02 10*3/uL (ref 0.00–0.07)
Basophils Absolute: 0 10*3/uL (ref 0.0–0.1)
Basophils Relative: 1 %
Eosinophils Absolute: 0 10*3/uL (ref 0.0–0.5)
Eosinophils Relative: 0 %
HCT: 23.8 % — ABNORMAL LOW (ref 39.0–52.0)
Hemoglobin: 7.6 g/dL — ABNORMAL LOW (ref 13.0–17.0)
Immature Granulocytes: 0 %
Lymphocytes Relative: 22 %
Lymphs Abs: 1.4 10*3/uL (ref 0.7–4.0)
MCH: 35.5 pg — ABNORMAL HIGH (ref 26.0–34.0)
MCHC: 31.9 g/dL (ref 30.0–36.0)
MCV: 111.2 fL — ABNORMAL HIGH (ref 80.0–100.0)
Monocytes Absolute: 0.6 10*3/uL (ref 0.1–1.0)
Monocytes Relative: 9 %
Neutro Abs: 4.5 10*3/uL (ref 1.7–7.7)
Neutrophils Relative %: 68 %
Platelet Count: 70 10*3/uL — ABNORMAL LOW (ref 150–400)
RBC: 2.14 MIL/uL — ABNORMAL LOW (ref 4.22–5.81)
RDW: 17.7 % — ABNORMAL HIGH (ref 11.5–15.5)
WBC Count: 6.5 10*3/uL (ref 4.0–10.5)
nRBC: 0 % (ref 0.0–0.2)

## 2020-03-17 LAB — ABO/RH: ABO/RH(D): B POS

## 2020-03-17 MED ORDER — HEPARIN SOD (PORK) LOCK FLUSH 100 UNIT/ML IV SOLN
500.0000 [IU] | Freq: Once | INTRAVENOUS | Status: AC
  Administered 2020-03-17: 500 [IU]
  Filled 2020-03-17: qty 5

## 2020-03-17 MED ORDER — SODIUM CHLORIDE 0.9% FLUSH
10.0000 mL | Freq: Once | INTRAVENOUS | Status: AC
  Administered 2020-03-17: 14:00:00 10 mL
  Filled 2020-03-17: qty 10

## 2020-03-17 NOTE — Progress Notes (Signed)
Pharmacist Chemotherapy Monitoring - Follow Up Assessment    I verify that I have reviewed each item in the below checklist:  . Regimen for the patient is scheduled for the appropriate day and plan matches scheduled date. Marland Kitchen Appropriate non-routine labs are ordered dependent on drug ordered. . If applicable, additional medications reviewed and ordered per protocol based on lifetime cumulative doses and/or treatment regimen.   Plan for follow-up and/or issues identified: No . I-vent associated with next due treatment: No . MD and/or nursing notified: No  Myda Detwiler K 03/17/2020 2:23 PM

## 2020-03-17 NOTE — Telephone Encounter (Signed)
Per Pt request appts today with Lacie reschedule for afternoon.

## 2020-03-18 ENCOUNTER — Other Ambulatory Visit: Payer: Self-pay

## 2020-03-18 ENCOUNTER — Inpatient Hospital Stay: Payer: Medicare HMO

## 2020-03-18 ENCOUNTER — Other Ambulatory Visit: Payer: Self-pay | Admitting: Emergency Medicine

## 2020-03-18 DIAGNOSIS — D6481 Anemia due to antineoplastic chemotherapy: Secondary | ICD-10-CM | POA: Diagnosis not present

## 2020-03-18 DIAGNOSIS — D649 Anemia, unspecified: Secondary | ICD-10-CM

## 2020-03-18 DIAGNOSIS — C221 Intrahepatic bile duct carcinoma: Secondary | ICD-10-CM | POA: Diagnosis not present

## 2020-03-18 DIAGNOSIS — I4891 Unspecified atrial fibrillation: Secondary | ICD-10-CM | POA: Diagnosis not present

## 2020-03-18 DIAGNOSIS — Z5111 Encounter for antineoplastic chemotherapy: Secondary | ICD-10-CM | POA: Diagnosis not present

## 2020-03-18 LAB — PREPARE RBC (CROSSMATCH)

## 2020-03-18 MED ORDER — SODIUM CHLORIDE 0.9% IV SOLUTION
250.0000 mL | Freq: Once | INTRAVENOUS | Status: AC
  Administered 2020-03-18: 250 mL via INTRAVENOUS
  Filled 2020-03-18: qty 250

## 2020-03-18 MED ORDER — HEPARIN SOD (PORK) LOCK FLUSH 100 UNIT/ML IV SOLN
500.0000 [IU] | Freq: Every day | INTRAVENOUS | Status: AC | PRN
  Administered 2020-03-18: 500 [IU]
  Filled 2020-03-18: qty 5

## 2020-03-18 MED ORDER — SODIUM CHLORIDE 0.9% FLUSH
10.0000 mL | INTRAVENOUS | Status: AC | PRN
  Administered 2020-03-18: 14:00:00 10 mL
  Filled 2020-03-18: qty 10

## 2020-03-18 NOTE — Patient Instructions (Signed)

## 2020-03-20 LAB — TYPE AND SCREEN
ABO/RH(D): B POS
Antibody Screen: NEGATIVE
Unit division: 0
Unit division: 0

## 2020-03-20 LAB — BPAM RBC
Blood Product Expiration Date: 202105222359
Blood Product Expiration Date: 202105292359
ISSUE DATE / TIME: 202104290933
ISSUE DATE / TIME: 202104290933
Unit Type and Rh: 7300
Unit Type and Rh: 7300

## 2020-03-22 MED FILL — Fosaprepitant Dimeglumine For IV Infusion 150 MG (Base Eq): INTRAVENOUS | Qty: 5 | Status: AC

## 2020-03-22 MED FILL — Dexamethasone Sodium Phosphate Inj 100 MG/10ML: INTRAMUSCULAR | Qty: 1 | Status: AC

## 2020-03-22 NOTE — Progress Notes (Addendum)
[place]   Telephone:(336) [phone] Fax:(336) [phone]   Clinic Follow up Note   Patient Care Team: [name], PA as PCP - General (Family Medicine) [name], MD as Consulting Physician (General Surgery) [name], [name], MD as Consulting Physician (Gastroenterology) [name], MD as Consulting Physician (Hematology) 03/23/2020  CHIEF COMPLAINT: F/u cholangiocarcinoma   SUMMARY OF ONCOLOGIC HISTORY: Oncology History Overview Note  Cancer Staging Intrahepatic cholangiocarcinoma ([place]) Staging form: Intrahepatic Bile Duct, AJCC 8th Edition - Clinical stage from 08/19/2019: Stage II (cT2, cN0, cM0) - Signed by [name], MD on 08/19/2019    Intrahepatic cholangiocarcinoma ([place])  06/28/2019 Imaging   CT AP W Contrast 06/28/19  IMPRESSION: 1. Heterogeneous hypodensity posteriorly in the right hepatic lobe and potentially extending into the caudate lobe suspicious for a mass. There is felt to be truncation of branches of the portal vein in this vicinity and some narrowing of the hepatic vein, as well as triangular-shaped regions of abnormal hypoenhancement posteriorly in the right hepatic lobe likely representing downstream vascular effects. Cannot exclude malignancy such as cholangiocarcinoma or hepatocellular carcinoma, and follow up hepatic protocol MRI with and without contrast is recommended to further characterize. 2. 4 mm right middle lobe pulmonary nodule is likely benign but may merit surveillance. 3. Cholelithiasis. 4.  Aortic Atherosclerosis (ICD10-I70.0). 5. Prostatomegaly. 6. Mild impingement at L3-4 and L4-5.   07/31/2019 Imaging   MRI Liver 07/31/19 IMPRESSION: 1. 7.3 cm in long axis mass in the right hepatic lobe spanning into the caudate lobe, high suspicion for malignancy such as hepatocellular carcinoma or cholangiocarcinoma. Suspected effacement or occlusion of the right hepatic vein and posterior branches of the right portal vein.  Two smaller tumor nodules along the posterior periphery of the dominant mass. Tissue diagnosis is recommended. 2. No findings of pathologic adenopathy or distant metastatic spread. 3. 9 mm gallstone in the gallbladder. There is mild gallbladder wall thickening which may be from nondistention, correlate clinically in assessing for cholecystitis. 4.  Aortic Atherosclerosis (ICD10-I70.0). 5. Mild diffuse hepatic steatosis.   08/11/2019 Initial Biopsy   DIAGNOSIS: 08/11/19  A. LIVER, RIGHT, BIOPSY:  - Adenocarcinoma.   08/18/2019 Imaging   CT Chest 08/18/19  IMPRESSION: 1. Multiple pulmonary nodules largest at approximately 7 mm in the right lower lobe, nonspecific but concerning given findings in the liver. 2. No signs of definitive metastatic disease, also with mildly enlarged upper abdominal lymph nodes as discussed.   Aortic Atherosclerosis (ICD10-I70.0).   08/19/2019 Initial Diagnosis   Intrahepatic cholangiocarcinoma ([place])   08/19/2019 Cancer Staging   Staging form: Intrahepatic Bile Duct, AJCC 8th Edition - Clinical stage from 08/19/2019: Stage II (cT2, cN0, cM0) - Signed by [name], MD on 08/19/2019   09/29/2019 -  Chemotherapy   Cisplatin and Gemcitabine 2 weeks on/1 week off starting 09/29/19    10/09/2019 Imaging   CT AP IMPRESSION: 1. The dominant right hepatic lobe mass is minimally reduced in size compared to prior exams, currently measuring 6.2 by 5.3 cm, previously 6.2 by 5.6 cm. However, there is a new small hypodense lesion centrally in the right hepatic lobe which is suspicious for a new small focus of tumor. Accordingly this is an overall mixed appearance. 2. Continued hypoenhancement in the liver downstream of the tumor likely attributable to narrowing or occlusion of the right hepatic vein by the tumor. By virtue of its location the tumor wraps around the intrahepatic portion of the IVC. 3. 4 mm right middle lobe pulmonary nodule, stable  compared to earliest  available comparison of 07/18/2019. Surveillance of the patient's pulmonary nodules is recommended. 4. Other imaging findings of potential clinical significance: Coronary atherosclerosis. Cholelithiasis. Prominent stool throughout the colon favors constipation. Moderate prostatomegaly with heterogeneous enhancement of the prostate gland. Lumbar spondylosis and degenerative disc disease causing mild bilateral foraminal impingement at L3-4 and L4-5.   Aortic Atherosclerosis (ICD10-I70.0).   12/24/2019 Imaging   CT CAP W Contrast  IMPRESSION: 1. Right hepatic lobe mass and adjacent right hepatic lobe nodules appear grossly stable. No evidence of distant metastatic disease. 2. Continued stability of small pulmonary nodules. Recommend attention on follow-up. 3. Cholelithiasis. 4. Enlarged prostate. 5. Aortic atherosclerosis (ICD10-I70.0). Coronary artery calcification.     CURRENT THERAPY:  Cisplatin and Gemcitabine 2 weeks on/1 week off starting 09/29/19  INTERVAL HISTORY: Brandon Sherman returns for f/u as scheduled. He completed cycle 8 gem/cisplatin on 03/08/20. He received RBC transfusion last week for symptomatic anemia with hgb 8.0. He has improved strength in his legs after transfusion but for past 3 days as rest and exertional dyspnea and orthopnea. Left leg is usually swollen due to gout but right leg edema is new. Not on diuretic. He checks his oxygen at home and says it's been in the 80's since blood transfusion. He has some nasal congestion but not bad. He has mild cough at baseline which is unchanged. Produces clear phlegm. Denies chest pain. His epigastric pain is stable, on PPI which helps. He has no history of pulmonary disease or cardiac disease. He is seeing cardiology tomorrow for new diagnosis Afib. He is on metoprolol and eliquis which are new since 3 weeks ago.   From a chemo standpoint, he tolerates treatment. No mucositis, fever, chills, rash, neuropathy, or new concerns.      MEDICAL HISTORY:  Past Medical History:  Diagnosis Date   Arthritis    Diabetes ([place])    GERD (gastroesophageal reflux disease)    Hyperlipidemia    Hypertension    Intrahepatic cholangiocarcinoma ([place])     SURGICAL HISTORY: Past Surgical History:  Procedure Laterality Date   APPENDECTOMY  1980   COLONOSCOPY     IR IMAGING GUIDED PORT INSERTION  02/23/2020    I have reviewed the social history and family history with the patient and they are unchanged from previous note.  ALLERGIES:  has No Known Allergies.  MEDICATIONS:  Current Outpatient Medications  Medication Sig Dispense Refill   allopurinol (ZYLOPRIM) 100 MG tablet Take 100 mg by mouth daily.     amLODipine (NORVASC) 2.5 MG tablet Take 2.5 mg by mouth daily.     fenofibrate (TRICOR) 145 MG tablet Take 145 mg by mouth daily.     finasteride (PROSCAR) 5 MG tablet Take 5 mg by mouth daily.     lisinopril (ZESTRIL) 40 MG tablet Take 40 mg by mouth daily.     magnesium oxide (MAG-OX) 400 MG tablet TAKE 1 TABLET BY MOUTH EVERY DAY 30 tablet 0   metFORMIN (GLUCOPHAGE) 500 MG tablet Take 500 mg by mouth 2 (two) times daily with a meal.      metoprolol succinate (TOPROL XL) 25 MG 24 hr tablet Take 1 tablet (25 mg total) by mouth daily. 30 tablet 0   ondansetron (ZOFRAN) 4 MG tablet Take 4 mg by mouth every 8 (eight) hours as needed for nausea or vomiting.     ondansetron (ZOFRAN) 8 MG tablet Take 1 tablet (8 mg total) by mouth 2 (two) times daily as needed. Start on  the third day after chemotherapy. 30 tablet 1   pantoprazole (PROTONIX) 40 MG tablet TAKE 1 TABLET BY MOUTH TWICE A DAY 180 tablet 0   pravastatin (PRAVACHOL) 40 MG tablet Take 40 mg by mouth daily.     prochlorperazine (COMPAZINE) 10 MG tablet Take 1 tablet (10 mg total) by mouth every 6 (six) hours as needed (Nausea or vomiting). 30 tablet 1   promethazine (PHENERGAN) 12.5 MG tablet TAKE 1 TABLET BY MOUTH EVERY 6 HOURS AS NEEDED FOR  NAUSEA OR VOMITING. 20 tablet 1   sucralfate (CARAFATE) 1 g tablet Take 1 tablet (1 g total) by mouth every 6 (six) hours as needed. 180 tablet 1   tamsulosin (FLOMAX) 0.4 MG CAPS capsule Take 0.4 mg by mouth daily.     Vitamin D, Ergocalciferol, (DRISDOL) 1.25 MG (50000 UT) CAPS capsule Take 50,000 Units by mouth every 7 (seven) days.     aspirin EC 81 MG tablet Take 81 mg by mouth daily.     famotidine (PEPCID) 40 MG tablet Take 0.5 tablets (20 mg total) by mouth 2 (two) times daily. 30 tablet 5   ferrous sulfate 325 (65 FE) MG tablet Take 1 tablet (325 mg total) by mouth daily. 30 tablet 0   furosemide (LASIX) 20 MG tablet Take 1 tablet (20 mg total) by mouth once for 1 dose. 1 tablet 0   No current facility-administered medications for this visit.   Facility-Administered Medications Ordered in Other Visits  Medication Dose Route Frequency Provider Last Rate Last Admin   sodium chloride flush (NS) 0.9 % injection 10 mL  10 mL Intracatheter PRN [name], MD   10 mL at 03/23/20 1225    PHYSICAL EXAMINATION: ECOG PERFORMANCE STATUS: 1 - Symptomatic but completely ambulatory  Vitals:   03/23/20 0856 03/23/20 0900  BP: (!) 172/99 (!) 162/92  Pulse: 72   Resp: 20   Temp: 98.2 F (36.8 C)   SpO2: 95%    Filed Weights   03/23/20 0856  Weight: 228 lb 5 oz (103.6 kg)    GENERAL:alert, no distress and comfortable SKIN: no rash  EYES: sclera clear NECK: without mass or swelling  LUNGS: decreased in bases with fine crackles bilaterally, no wheezing. Normal breathing effort at rest  HEART: irregular rhythm, L>R pitting lower leg edema. No significant JVD.  ABDOMEN: abdomen soft, non-tender and normal bowel sounds NEURO: alert & oriented x 3 with fluent speech, normal gait PAC without erythema   LABORATORY DATA:  I have reviewed the data as listed CBC Latest Ref Rng & Units 03/23/2020 03/17/2020 03/15/2020  WBC 4.0 - 10.5 K/uL 4.5 6.5 3.8(L)  Hemoglobin 13.0 - 17.0 g/dL  10.4(L) 7.6(L) 7.7(L)  Hematocrit 39.0 - 52.0 % 32.2(L) 23.8(L) 23.7(L)  Platelets 150 - 400 K/uL 173 70(L) 62(L)     CMP Latest Ref Rng & Units 03/23/2020 03/15/2020 03/08/2020  Glucose 70 - 99 mg/dL 103(H) 89 108(H)  BUN 8 - 23 mg/dL 16 26(H) 22  Creatinine 0.61 - 1.24 mg/dL 1.38(H) 1.46(H) 1.42(H)  Sodium 135 - 145 mmol/L 141 138 137  Potassium 3.5 - 5.1 mmol/L 4.4 4.2 4.2  Chloride 98 - 111 mmol/L 110 107 109  CO2 22 - 32 mmol/L 23 23 20(L)  Calcium 8.9 - 10.3 mg/dL 9.2 8.7(L) 8.9  Total Protein 6.5 - 8.1 g/dL 6.5 6.5 6.5  Total Bilirubin 0.3 - 1.2 mg/dL 0.9 0.9 0.6  Alkaline Phos 38 - 126 U/L 56 53 76  AST 15 -  41 U/L 21 22 21   ALT 0 - 44 U/L 13 18 19       RADIOGRAPHIC STUDIES: I have personally reviewed the radiological images as listed and agreed with the findings in the report. DG Chest 2 View  Result Date: 03/23/2020 CLINICAL DATA:  Shortness of breath.  History of cholangiocarcinoma EXAM: CHEST - 2 VIEW COMPARISON:  Chest CT December 24, 2019 FINDINGS: There are small pleural effusions bilaterally with bibasilar atelectasis. Lungs elsewhere are clear. Heart size and pulmonary vascularity are normal. No evident adenopathy. Port-A-Cath tip is at the cavoatrial junction. There is degenerative change in the thoracic spine. Old rib trauma noted on the left with remodeling. IMPRESSION: Small pleural effusions bilaterally with bibasilar atelectasis. Lungs elsewhere clear. Cardiac silhouette within normal limits. No adenopathy. Port-A-Cath tip at cavoatrial junction. Electronically Signed   By: Lowella Grip III M.D.   On: 03/23/2020 09:57     ASSESSMENT & PLAN: Brandon Arron Sizemoreis a 74 y.o.malewith    1. Acute dyspnea and orthopnea  -He was symptomatic from his anemia hgb 7.6 and he received 2 units pRBCs on 4/29 -Hgb improved to 10.4 today. -3 days after RBC transfusion he developed dyspnea at rest and orthopnea. He desats to 88% in clinic today with ambulation  -CXR shows  small pleural effusions, not likely contributing. No signs of infection -BNP 546 which indicates cardiac etiology  -consult with Dr. Gwenlyn Found on 03/24/20, will defer additional work up  -continues Beta blocker and eliquis for Afib   2.Intrahepatic cholangiocarcinoma, cT2N0Mx,unresectable,with indeterminate lung nodules -Diagnosed in9/2020.CT scans and MRI liver showa large7.3cm mass in the right hepatic lobe whichabutsportal vein.Both our local surgeon Dr. Barry Dienes andDr Carlis Abbott at [facility] concluded that cancer is not resectable, and therefore not likely curable,due to the invasion to portal vein. -He beganpalliativesystemic treatment standardfirst line chemo with IV Cisplatin and Gemcitabine 2 weeks on/1 week off.Started 09/29/19, tolerating well -Restaging CT CAP on 12/24/19 was stable, continues current treatment -Brandon Sherman appears stable. He completed 8 cycles gem/cisplatin. He tolerated treatment well until he developed symptomatic anemia after cycle 8. He was transfused 2 units which improved hgb to 10.4. He developed more dyspnea, hypoxia to 88% and orthopnea recently after transfusion.  -BNP >500 today indicates cardiac etiology. Will see Dr. Gwenlyn Found on 03/24/20.  -He will proceed with cycle 9 day 1 chemo today, hold cisplatin and hydration to avoid fluid overload. Will increase lasix to 20 mg IV in clinic and 20 mg po x1 tonight at home.  -F/u next week -plan to restage after cycle 9, CT CAP ordered today  3. Symptomatic anemia -he developed exertional dyspnea and fatigue after cycle 8 chemo -he denies bleeding, this is likely secondary to chemo-induced anemia Hgb 7.6 -ED recommended oral iron, have not checked iron studies lately  -on anticoagulation for new Afib, PLT 70 OK to continue. We reviewed bleeding and fall precautions  -He was symptomatic from his anemia and he received 2 units pRBCs on 4/29 -Hgb improved to 10.4 today, remains dyspneic  4. New onset Afib -diagnosed after  PAC placement on 02/23/20. -CHADS2 score 2, moderate risk for stroke. Started on metoprolol and eliquis on 03/01/20. Tolerating well without bleeding.   5. Nausea, Low food intakeand weight loss -With pain from GERD and nausea he initially lost 30 pounds. -followed by dietician -weight fluctuates on treatment, some component of fluid retention in lower extremities -monitoring  6. DM, HTN, HLD, Gout  -On Metformin, amlodipine, lisinopril., allopurinol  -Continue to f/u  with his PCP -recently added amlodipine and anticoagulation for newly diagnosed Afib  7. Nicotine Use -He never smoked but has been chewing Tobacco for the past 60 years.He no longer drinks alcoholand tells me he has quit smoking (11/25/19).  8.GERDand gastritis, history ofesophageal candidiasis -Hehad repeated EGDwith Dr. Havery Moros in 07/2019. 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 He completed 8 cycles of cisplatin and gemcitabine. He tolerated treatment well overall. He developed worsening anemia after cycle 8 and was transfused 2 units pRBCs on 4/29 for hgb 7.6. This improved his strength but he developed acute onset dyspnea and orthopnea for past 3 days. Desats from 95% to 88% during ambulation in clinic. His hgb improved to 10.4 today. No signs of infection. He is a nonsmoker, no pulmonary history. Chest xray shows small bilateral pleural effusions, not likely the source. He is on eliquis so PE is less likely.  He has fine crackles in the bases and mild lower leg edema, will increase IV lasix to 20 mg today. He will record urine output while here today. To avoid excess fluid, will hold hydration fluids and cisplatin today. BNP today is elevated to 546 which indicates cardiac  etiology such as heart failure. He has a scheduled consult with Dr. Gwenlyn Found tomorrow for newly diagnosed Afib, he will likely need an echo tomorrow but will defer any additional studies to cardiology. I have sent him a message about this case.  The patient was seen with Dr. Benay Spice today.   Labs otherwise stable. He will proceed with C9D1 chemo today, gemcitabine only. He will return for f/u and day 8 next week. He is being referred for restaging scan after this cycle.   He knows to call or seek emergency evaluation if he develops worsening dyspnea, chest pain, or decline in his respiratory function in the meantime.   No problem-specific Assessment & Plan notes found for this encounter.   Orders Placed This Encounter  Procedures   DG Chest 2 View    Standing Status:   Future    Number of Occurrences:   1    Standing Expiration Date:   03/23/2021    Order Specific Question:   Reason for Exam (SYMPTOM  OR DIAGNOSIS REQUIRED)    Answer:   dyspnea and hypoxia for 3 days after RBC transfusion last week, cholangio carcinoma    Order Specific Question:   Preferred imaging location?    Answer:   [facility]    Order Specific Question:   Radiology Contrast Protocol - do NOT remove file path    Answer:   \[email or web address]   CT Abdomen Pelvis W Contrast    Standing Status:   Future    Standing Expiration Date:   03/23/2021    Order Specific Question:   If indicated for the ordered procedure, I authorize the administration of contrast media per Radiology protocol    Answer:   Yes    Order Specific Question:   Preferred imaging location?    Answer:   [facility]    Order Specific Question:   Is Oral Contrast requested for this exam?    Answer:   Yes, Per Radiology protocol    Order Specific Question:   Radiology Contrast Protocol - do NOT remove file path    Answer:   \  [email or web address]   CT Chest W Contrast     Standing Status:   Future    Standing Expiration Date:   03/23/2021    Order Specific Question:   If indicated for the ordered procedure, I authorize the administration of contrast media per Radiology protocol    Answer:   Yes    Order Specific Question:   Preferred imaging location?    Answer:   [facility]    Order Specific Question:   Radiology Contrast Protocol - do NOT remove file path    Answer:   \[email or web address]   BNP (Brain natriuretic peptide)    Please send tube to infusion    Standing Status:   Future    Number of Occurrences:   1    Standing Expiration Date:   03/23/2021   All questions were answered. The patient knows to call the clinic with any problems, questions or concerns. No barriers to learning was detected.     Alla Feeling, NP 03/23/20  This was a shared visit with Cira Rue.  Brandon Sherman was interviewed and examined.  He has cholangiocarcinoma and is currently being treated with gemcitabine/cisplatin.  He presents today with dyspnea/hypoxia.  I suspect his symptoms are related to CHF.  I have a low clinical suspicion for pneumonitis and pulmonary embolism.  He is scheduled to see Dr. Alvester Chou tomorrow.  We decided to hold intravenous hydration and cisplatin today.  He will receive gemcitabine.  He was given Lasix today.  Julieanne Manson, MD

## 2020-03-23 ENCOUNTER — Inpatient Hospital Stay: Payer: Medicare HMO

## 2020-03-23 ENCOUNTER — Ambulatory Visit (HOSPITAL_COMMUNITY)
Admission: RE | Admit: 2020-03-23 | Discharge: 2020-03-23 | Disposition: A | Discharge status: Home / Self Care | Payer: Medicare HMO | Source: Ambulatory Visit | Attending: Nurse Practitioner | Admitting: Nurse Practitioner

## 2020-03-23 ENCOUNTER — Other Ambulatory Visit: Payer: Self-pay

## 2020-03-23 ENCOUNTER — Inpatient Hospital Stay: Payer: Medicare HMO | Attending: Hematology

## 2020-03-23 ENCOUNTER — Inpatient Hospital Stay (HOSPITAL_BASED_OUTPATIENT_CLINIC_OR_DEPARTMENT_OTHER): Payer: Medicare HMO | Admitting: Nurse Practitioner

## 2020-03-23 ENCOUNTER — Encounter: Payer: Self-pay | Admitting: Nurse Practitioner

## 2020-03-23 VITALS — BP 162/92 | HR 72 | Temp 98.2°F | Resp 20 | Ht 71.0 in | Wt 228.3 lb

## 2020-03-23 DIAGNOSIS — R600 Localized edema: Secondary | ICD-10-CM | POA: Diagnosis not present

## 2020-03-23 DIAGNOSIS — Z5189 Encounter for other specified aftercare: Secondary | ICD-10-CM | POA: Diagnosis not present

## 2020-03-23 DIAGNOSIS — C221 Intrahepatic bile duct carcinoma: Secondary | ICD-10-CM

## 2020-03-23 DIAGNOSIS — Z5111 Encounter for antineoplastic chemotherapy: Secondary | ICD-10-CM | POA: Insufficient documentation

## 2020-03-23 DIAGNOSIS — Z87891 Personal history of nicotine dependence: Secondary | ICD-10-CM | POA: Insufficient documentation

## 2020-03-23 DIAGNOSIS — J90 Pleural effusion, not elsewhere classified: Secondary | ICD-10-CM | POA: Diagnosis not present

## 2020-03-23 DIAGNOSIS — R0600 Dyspnea, unspecified: Secondary | ICD-10-CM

## 2020-03-23 DIAGNOSIS — R0602 Shortness of breath: Secondary | ICD-10-CM | POA: Diagnosis not present

## 2020-03-23 DIAGNOSIS — Z7189 Other specified counseling: Secondary | ICD-10-CM

## 2020-03-23 LAB — CMP (CANCER CENTER ONLY)
ALT: 13 U/L (ref 0–44)
AST: 21 U/L (ref 15–41)
Albumin: 3.9 g/dL (ref 3.5–5.0)
Alkaline Phosphatase: 56 U/L (ref 38–126)
Anion gap: 8 (ref 5–15)
BUN: 16 mg/dL (ref 8–23)
CO2: 23 mmol/L (ref 22–32)
Calcium: 9.2 mg/dL (ref 8.9–10.3)
Chloride: 110 mmol/L (ref 98–111)
Creatinine: 1.38 mg/dL — ABNORMAL HIGH (ref 0.61–1.24)
GFR, Est AFR Am: 59 mL/min — ABNORMAL LOW (ref >60)
GFR, Estimated: 51 mL/min — ABNORMAL LOW (ref >60)
Glucose, Bld: 103 mg/dL — ABNORMAL HIGH (ref 70–99)
Potassium: 4.4 mmol/L (ref 3.5–5.1)
Sodium: 141 mmol/L (ref 135–145)
Total Bilirubin: 0.9 mg/dL (ref 0.3–1.2)
Total Protein: 6.5 g/dL (ref 6.5–8.1)

## 2020-03-23 LAB — CBC WITH DIFFERENTIAL (CANCER CENTER ONLY)
Abs Immature Granulocytes: 0 10*3/uL (ref 0.00–0.07)
Basophils Absolute: 0 10*3/uL (ref 0.0–0.1)
Basophils Relative: 1 %
Eosinophils Absolute: 0.1 10*3/uL (ref 0.0–0.5)
Eosinophils Relative: 1 %
HCT: 32.2 % — ABNORMAL LOW (ref 39.0–52.0)
Hemoglobin: 10.4 g/dL — ABNORMAL LOW (ref 13.0–17.0)
Immature Granulocytes: 0 %
Lymphocytes Relative: 16 %
Lymphs Abs: 0.7 10*3/uL (ref 0.7–4.0)
MCH: 33.8 pg (ref 26.0–34.0)
MCHC: 32.3 g/dL (ref 30.0–36.0)
MCV: 104.5 fL — ABNORMAL HIGH (ref 80.0–100.0)
Monocytes Absolute: 0.6 10*3/uL (ref 0.1–1.0)
Monocytes Relative: 14 %
Neutro Abs: 3.1 10*3/uL (ref 1.7–7.7)
Neutrophils Relative %: 68 %
Platelet Count: 173 10*3/uL (ref 150–400)
RBC: 3.08 MIL/uL — ABNORMAL LOW (ref 4.22–5.81)
RDW: 19.1 % — ABNORMAL HIGH (ref 11.5–15.5)
WBC Count: 4.5 10*3/uL (ref 4.0–10.5)
nRBC: 0 % (ref 0.0–0.2)

## 2020-03-23 LAB — SAMPLE TO BLOOD BANK

## 2020-03-23 LAB — BRAIN NATRIURETIC PEPTIDE: B Natriuretic Peptide: 546.3 pg/mL — ABNORMAL HIGH (ref 0.0–100.0)

## 2020-03-23 IMAGING — DX DG CHEST 2V
2 series · 2 of 2 positions shown · non-contrast
Comparison: Chest CT [DATE]

CLINICAL DATA: Shortness of breath.  History of cholangiocarcinoma

EXAM:
CHEST - 2 VIEW

[chest pa]
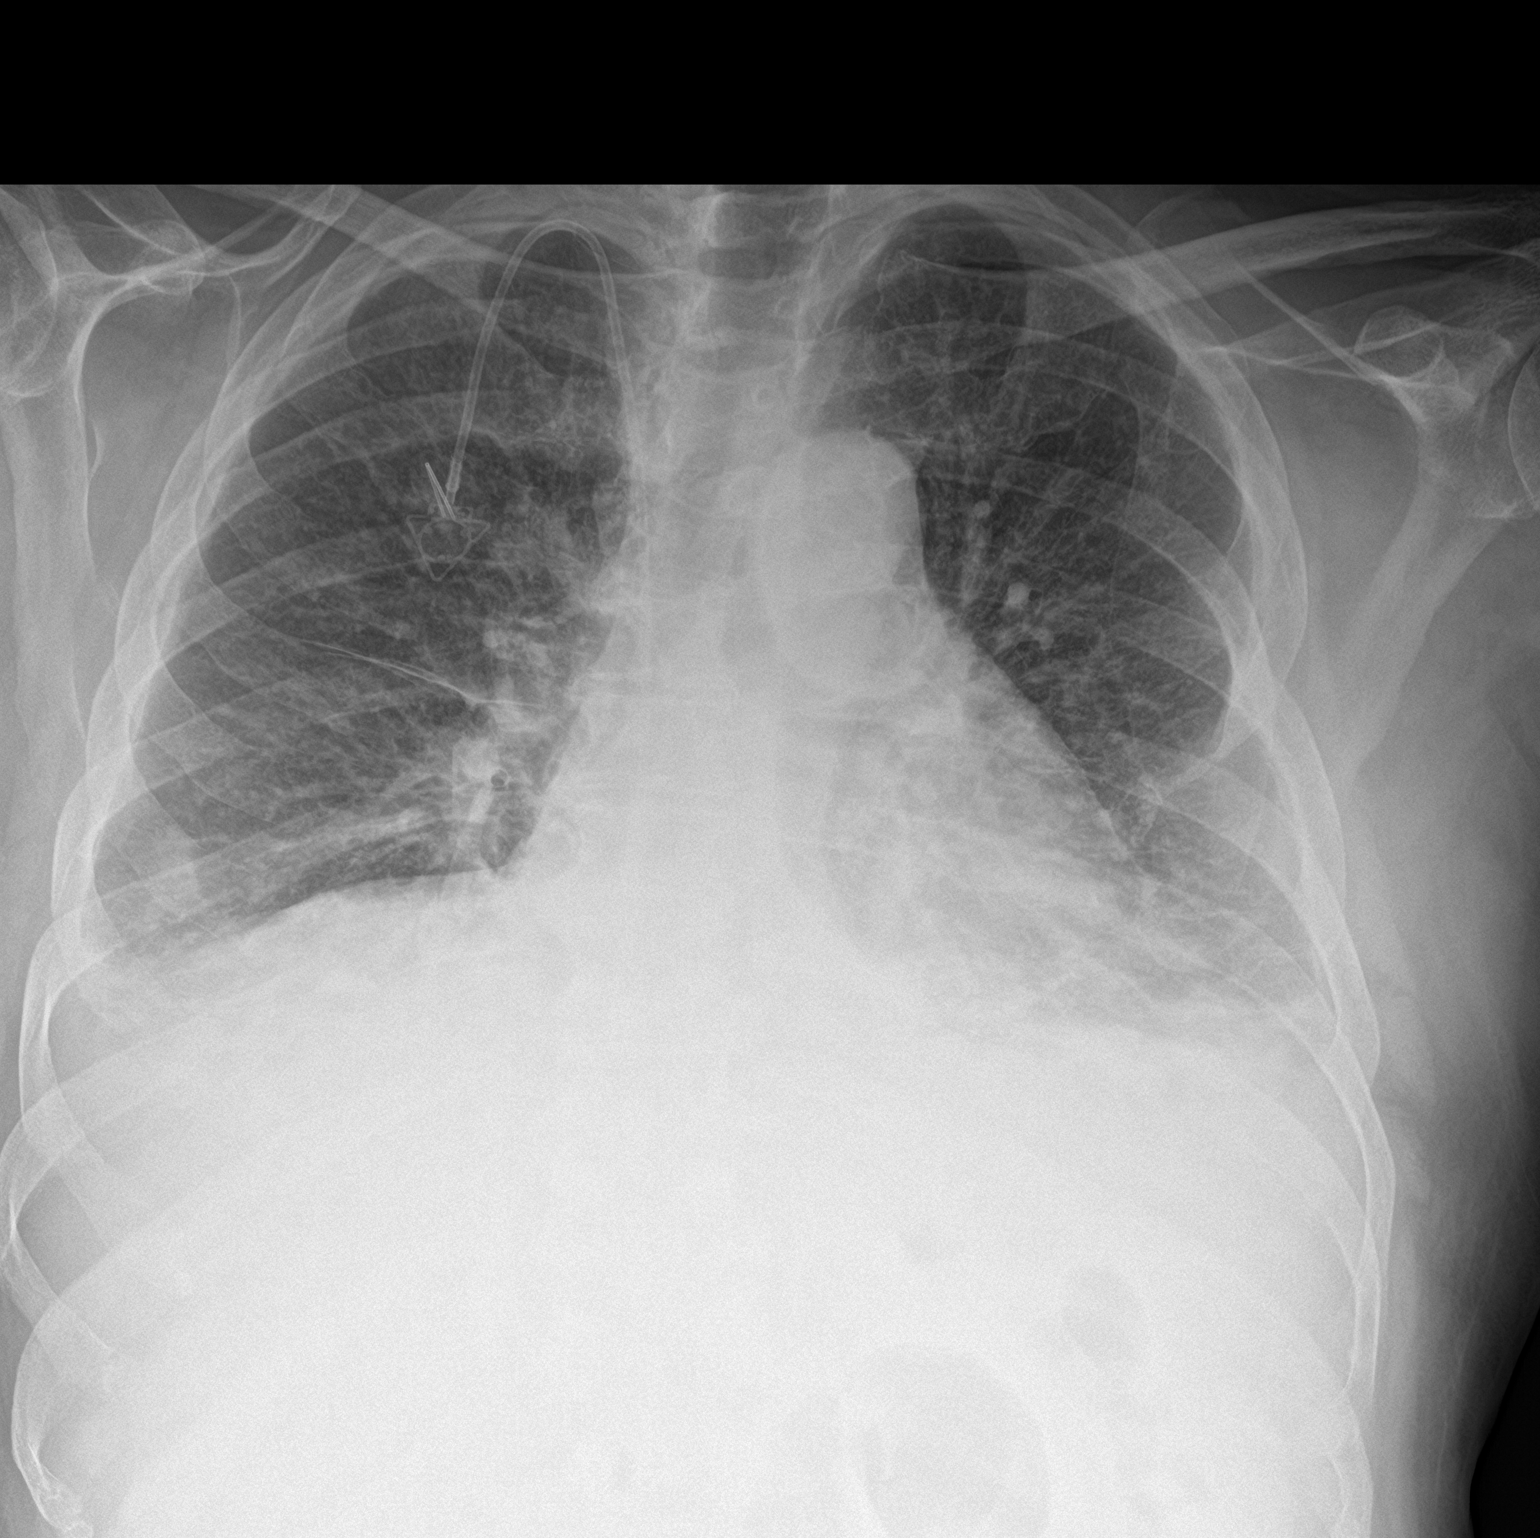

[chest lat]
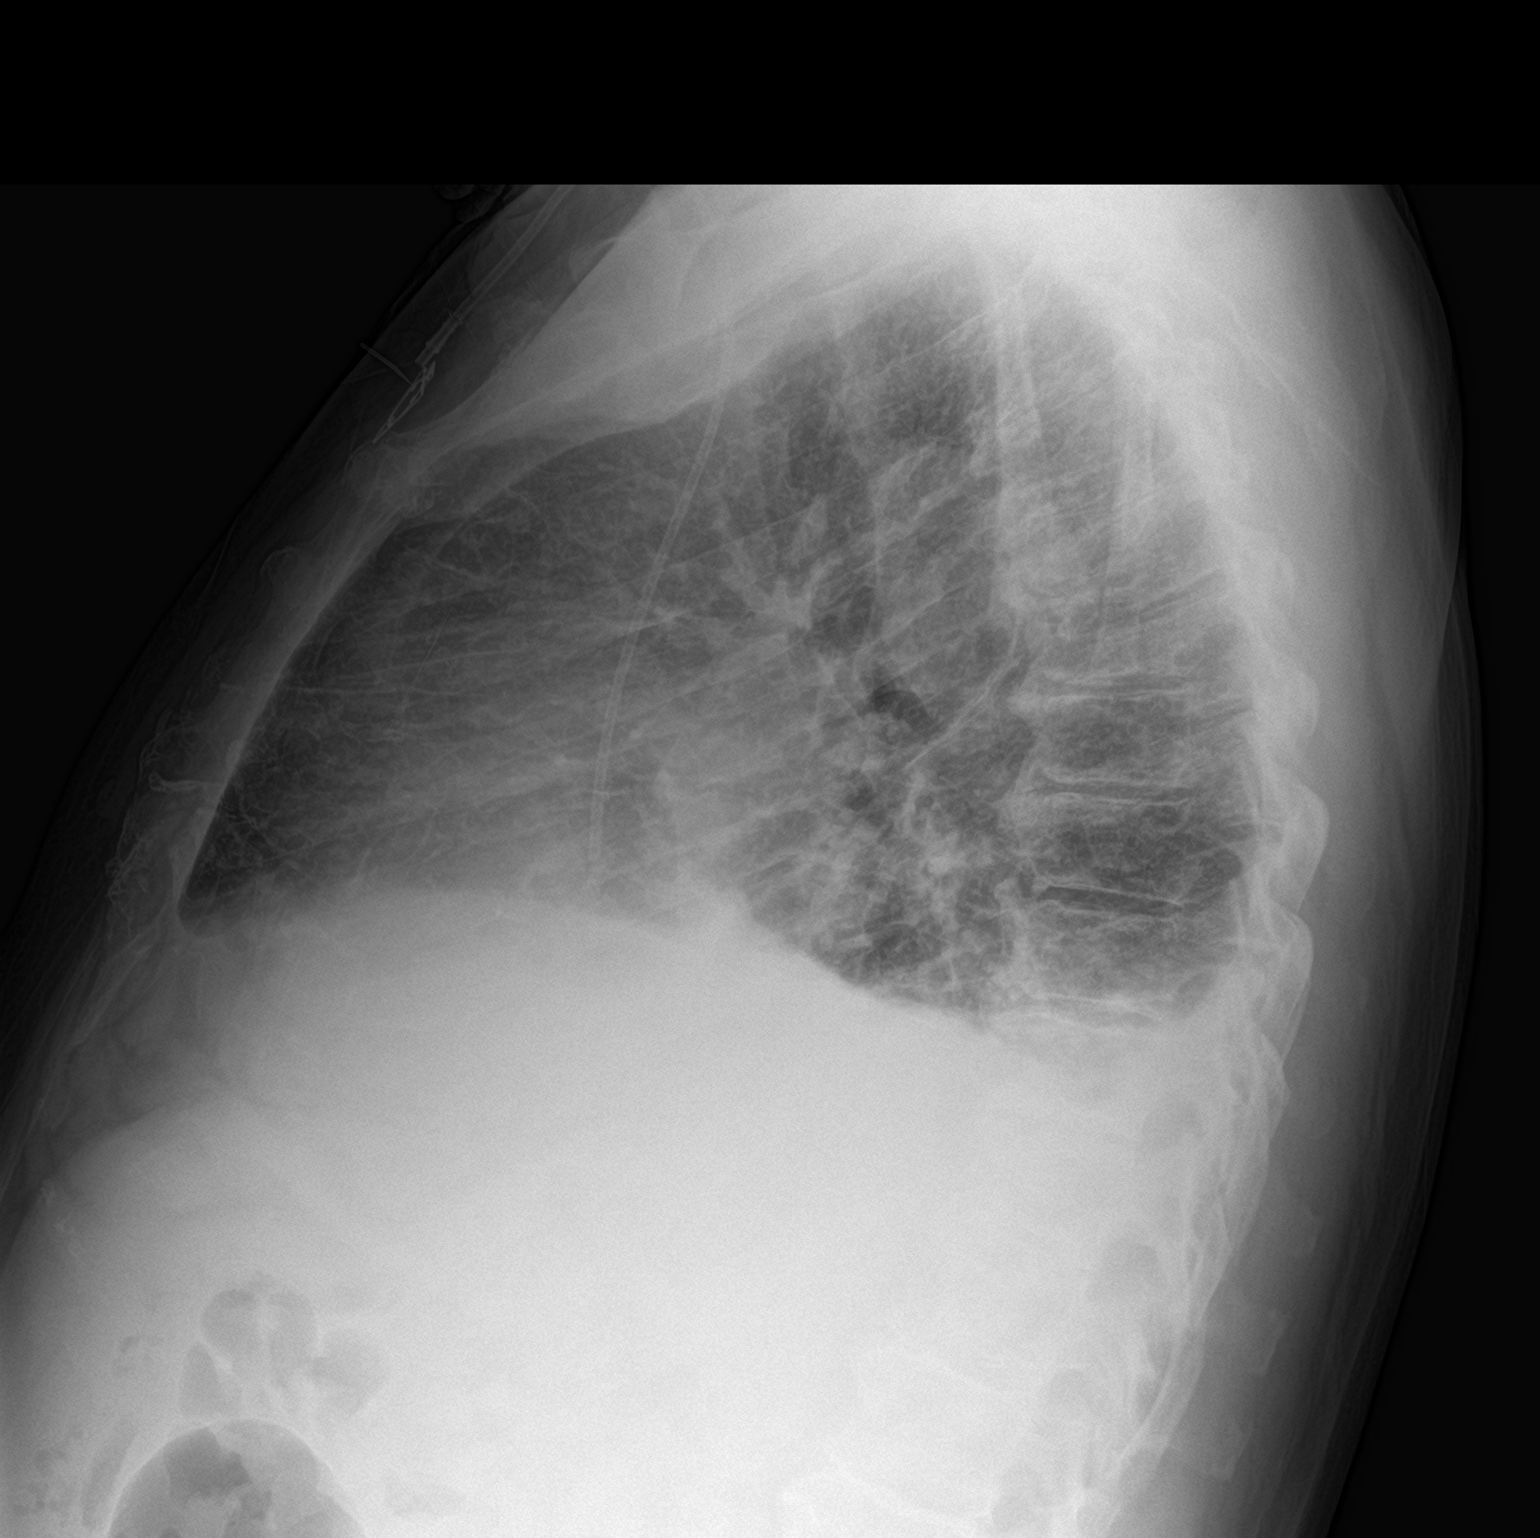

[2 of 2 positions shown; findings below may reference images not displayed]

FINDINGS: There are small pleural effusions bilaterally with bibasilar
atelectasis. Lungs elsewhere are clear. Heart size and pulmonary
vascularity are normal. No evident adenopathy. Port-A-Cath tip is at
the cavoatrial junction. There is degenerative change in the
thoracic spine. Old rib trauma noted on the left with remodeling.
IMPRESSION: Small pleural effusions bilaterally with bibasilar atelectasis.
Lungs elsewhere clear. Cardiac silhouette within normal limits. No
adenopathy. Port-A-Cath tip at cavoatrial junction.

## 2020-03-23 MED ORDER — FUROSEMIDE 10 MG/ML IJ SOLN
20.0000 mg | Freq: Once | INTRAMUSCULAR | Status: AC
  Administered 2020-03-23: 11:00:00 20 mg via INTRAVENOUS

## 2020-03-23 MED ORDER — SODIUM CHLORIDE 0.9 % IV SOLN
Freq: Once | INTRAVENOUS | Status: AC
  Filled 2020-03-23: qty 250

## 2020-03-23 MED ORDER — FUROSEMIDE 20 MG PO TABS: 20.0000 mg | ORAL_TABLET | Freq: Once | ORAL | 0 refills | Status: DC

## 2020-03-23 MED ORDER — PROCHLORPERAZINE MALEATE 10 MG PO TABS
10.0000 mg | ORAL_TABLET | Freq: Once | ORAL | Status: AC
  Administered 2020-03-23: 10 mg via ORAL

## 2020-03-23 MED ORDER — SODIUM CHLORIDE 0.9% FLUSH
10.0000 mL | INTRAVENOUS | Status: DC | PRN
  Administered 2020-03-23: 10 mL
  Filled 2020-03-23: qty 10

## 2020-03-23 MED ORDER — FUROSEMIDE 10 MG/ML IJ SOLN
INTRAMUSCULAR | Status: AC
  Filled 2020-03-23: qty 2

## 2020-03-23 MED ORDER — HEPARIN SOD (PORK) LOCK FLUSH 100 UNIT/ML IV SOLN
500.0000 [IU] | Freq: Once | INTRAVENOUS | Status: AC | PRN
  Administered 2020-03-23: 500 [IU]
  Filled 2020-03-23: qty 5

## 2020-03-23 MED ORDER — SODIUM CHLORIDE 0.9 % IV SOLN
1000.0000 mg/m2 | Freq: Once | INTRAVENOUS | Status: AC
  Administered 2020-03-23: 2166 mg via INTRAVENOUS
  Filled 2020-03-23: qty 56.97

## 2020-03-23 NOTE — Patient Instructions (Signed)

## 2020-03-23 NOTE — Patient Instructions (Signed)
[facility] °Discharge Instructions for Patients Receiving Chemotherapy ° °Today you received the following chemotherapy agents Gemzar ° °To help prevent nausea and vomiting after your treatment, we encourage you to take your nausea medication as directed. °  °If you develop nausea and vomiting that is not controlled by your nausea medication, call the clinic.  ° °BELOW ARE SYMPTOMS THAT SHOULD BE REPORTED IMMEDIATELY: °· *FEVER GREATER THAN 100.5 F °· *CHILLS WITH OR WITHOUT FEVER °· NAUSEA AND VOMITING THAT IS NOT CONTROLLED WITH YOUR NAUSEA MEDICATION °· *UNUSUAL SHORTNESS OF BREATH °· *UNUSUAL BRUISING OR BLEEDING °· TENDERNESS IN MOUTH AND THROAT WITH OR WITHOUT PRESENCE OF ULCERS °· *URINARY PROBLEMS °· *BOWEL PROBLEMS °· UNUSUAL RASH °Items with * indicate a potential emergency and should be followed up as soon as possible. ° °Feel free to call the clinic should you have any questions or concerns. The clinic phone number is [phone]. ° °Please show the CHEMO ALERT CARD at check-in to the Emergency Department and triage nurse. ° ° °

## 2020-03-23 NOTE — Progress Notes (Addendum)
Per Lacie, patient is to receive gemcitabine only, no cisplatin today. This is due to shortness of breath and avoiding excessive fluid for the patient. Furosemide will be given today at increased dose of 20mg  IV. Premedication will be compazine only today.   Demetrius Charity, PharmD, BCPS, [facility] Pharmac[facility]acy Phone: [phone] 03/23/2020

## 2020-03-24 ENCOUNTER — Encounter: Payer: Self-pay | Admitting: Cardiovascular Disease

## 2020-03-24 ENCOUNTER — Other Ambulatory Visit: Payer: Self-pay | Admitting: *Deleted

## 2020-03-24 ENCOUNTER — Other Ambulatory Visit: Payer: Self-pay | Admitting: Hematology

## 2020-03-24 ENCOUNTER — Ambulatory Visit (INDEPENDENT_AMBULATORY_CARE_PROVIDER_SITE_OTHER): Payer: Medicare HMO | Admitting: Cardiovascular Disease

## 2020-03-24 ENCOUNTER — Telehealth: Payer: Self-pay | Admitting: Nurse Practitioner

## 2020-03-24 VITALS — BP 140/82 | HR 79 | Ht 71.0 in | Wt 225.6 lb

## 2020-03-24 DIAGNOSIS — R0600 Dyspnea, unspecified: Secondary | ICD-10-CM

## 2020-03-24 DIAGNOSIS — I2510 Atherosclerotic heart disease of native coronary artery without angina pectoris: Secondary | ICD-10-CM | POA: Diagnosis not present

## 2020-03-24 DIAGNOSIS — I4891 Unspecified atrial fibrillation: Secondary | ICD-10-CM | POA: Insufficient documentation

## 2020-03-24 DIAGNOSIS — R0602 Shortness of breath: Secondary | ICD-10-CM

## 2020-03-24 DIAGNOSIS — R0609 Other forms of dyspnea: Secondary | ICD-10-CM

## 2020-03-24 DIAGNOSIS — I482 Chronic atrial fibrillation, unspecified: Secondary | ICD-10-CM | POA: Diagnosis not present

## 2020-03-24 LAB — HEPATIC FUNCTION PANEL
ALT: 9 IU/L (ref 0–44)
AST: 19 IU/L (ref 0–40)
Albumin: 4.1 g/dL (ref 3.7–4.7)
Alkaline Phosphatase: 56 IU/L (ref 39–117)
Bilirubin Total: 1 mg/dL (ref 0.0–1.2)
Bilirubin, Direct: 0.45 mg/dL — ABNORMAL HIGH (ref 0.00–0.40)
Total Protein: 6.1 g/dL (ref 6.0–8.5)

## 2020-03-24 LAB — LIPID PANEL
Chol/HDL Ratio: 3.1 ratio (ref 0.0–5.0)
Cholesterol, Total: 125 mg/dL (ref 100–199)
HDL: 40 mg/dL (ref >39)
LDL Chol Calc (NIH): 67 mg/dL (ref 0–99)
Triglycerides: 95 mg/dL (ref 0–149)
VLDL Cholesterol Cal: 18 mg/dL (ref 5–40)

## 2020-03-24 MED ORDER — FUROSEMIDE 20 MG PO TABS: 20.0000 mg | ORAL_TABLET | ORAL | 3 refills | Status: DC

## 2020-03-24 MED ORDER — APIXABAN 5 MG PO TABS: 5.0000 mg | ORAL_TABLET | Freq: Two times a day (BID) | ORAL | 3 refills | Status: DC

## 2020-03-24 MED ORDER — APIXABAN 5 MG PO TABS
5.0000 mg | ORAL_TABLET | Freq: Two times a day (BID) | ORAL | 6 refills | Status: DC
Start: 2020-03-24 — End: 2020-03-24

## 2020-03-24 NOTE — Assessment & Plan Note (Signed)
History of hyperlipidemia on statin therapy.  We will check a lipid liver profile today. 

## 2020-03-24 NOTE — Assessment & Plan Note (Signed)
Brandon Sherman had coronary calcification seen on chest CT performed 12/24/2019.  Go to get a coronary calcium score to further quantitate.

## 2020-03-24 NOTE — Assessment & Plan Note (Addendum)
Brandon Sherman was recently recognized to be in atrial fibrillation when a port was placed 2 months ago.  He has no history of A. fib since that time.  He was placed on Eliquis oral anticoagulation 3 weeks ago by his PCP.  Today he is in A. fib with controlled ventricular spots on metoprolol.  He apparently was put on 2.5 mg of Eliquis twice daily which is not a therapeutic dose.  We will start 5 mg twice daily and arrange cardioversion after 4 weeks.

## 2020-03-24 NOTE — Patient Instructions (Addendum)
Medication Instructions:  START ELIQUIS 5 MG ONE TABLET TWICE DAILY  START FUROSEMIDE 20 MG ONE TABLET EVERY OTHER DAY  *If you need a refill on your cardiac medications before your next appointment, please call your pharmacy*   Lab Work: Your physician recommends that you HAVE LAB WORK TODAY  If you have labs (blood work) drawn today and your tests are completely normal, you will receive your results only by: Marland Kitchen MyChart Message (if you have MyChart) OR . A paper copy in the mail If you have any lab test that is abnormal or we need to change your treatment, we will call you to review the results.   Testing/Procedures:  CARDIAC CALCIUM SCORE CT SCAN AT [facility]  Your physician has requested that you have an echocardiogram. Echocardiography is a painless test that uses sound waves to create images of your heart. It provides your doctor with information about the size and shape of your heart and how well your heart's chambers and valves are working. This procedure takes approximately one hour. There are no restrictions for this procedure.[place] are scheduled for a Cardioversion on Tuesday 6/8/21with Dr. Meda Coffee.  Please arrive at the [facility] (Main Entrance A) at [facility]: [address] [place], [place] [address] at 8 am. (1 hour prior to procedure unless lab work is needed; if lab work is needed arrive 1.5 hours ahead)  GO TO [facility] Saturday 04/24/20 @ 11:45 AM FOR COVID TESTING  DIET: Nothing to eat or drink after midnight except a sip of water with medications (see medication instructions below)  Medication Instructions:               DO NOT TAKE FUROSEMIDE THE MORNING OF THE PROCEDURE  Continue your anticoagulant: ELIQUIS  You must have a responsible person to drive you home and stay in the waiting area during your procedure. Failure to do so could result in cancellation.  Bring your insurance cards.  *Special  Note: Every effort is made to have your procedure done on time. Occasionally there are emergencies that occur at the hospital that may cause delays. Please be patient if a delay does occur.   Follow-Up: At [facility], you and your health needs are our priority.  As part of our continuing mission to provide you with exceptional heart care, we have created designated Provider Care Teams.  These Care Teams include your primary Cardiologist (physician) and Advanced Practice Providers (APPs -  Physician Assistants and Nurse Practitioners) who all work together to provide you with the care you need, when you need it.  We recommend signing up for the patient portal called "MyChart".  Sign up information is provided on this After Visit Summary.  MyChart is used to connect with patients for Virtual Visits (Telemedicine).  Patients are able to view lab/test results, encounter notes, upcoming appointments, etc.  Non-urgent messages can be sent to your provider as well.   To learn more about what you can do with MyChart, go to [email or web address].    Your next appointment:   3 month(s)  The format for your next appointment:   In Person  Provider:   Quay Burow, MD

## 2020-03-24 NOTE — Assessment & Plan Note (Signed)
History of essential hypertension a blood pressure measured at 140/82.  He is on lisinopril and metoprolol.

## 2020-03-24 NOTE — Assessment & Plan Note (Signed)
History of dyspnea on exertion since starting chemotherapy for his liver cancer 6 months ago.  He was found to be in A. fib 2 months ago.  I am going to get a 2D echo to further evaluate.  He has no prior history of tobacco abuse.

## 2020-03-24 NOTE — Progress Notes (Signed)
03/24/2020 Brandon Sherman   May 19, 1946  [id number]  Primary Physician [name], PA Primary Cardiologist: Lorretta Harp MD [name], [place]  HPI:  Brandon Sherman is a 74 y.o. 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  His wife [name] is also a patient of mine.  He was referred by Ihor Dow, PA for atrial fibrillation.  His risk factors include treated hypertension, diabetes and hyperlipidemia.  Both his mother and sister had known CAD.  He is never had a heart attack or stroke.  He unfortunately was diagnosed with liver cancer back in September of last year and is begun on chemotherapy since that time.  A Port-A-Cath was placed 2 months ago which time he was noted to be in A. fib and he was started on Eliquis 3 weeks ago by his PCP.  He has complained of increased dyspnea on exertion over the last 6 months but denies chest pain.  He does not have history of heavy alcohol abuse which was discontinued at the time of his liver cancer diagnosis.   Current Meds  Medication Sig  . allopurinol (ZYLOPRIM) 100 MG tablet Take 100 mg by mouth daily.  Marland Kitchen amLODipine (NORVASC) 2.5 MG tablet Take 2.5 mg by mouth daily.  Marland Kitchen apixaban (ELIQUIS) 2.5 MG TABS tablet Take 2.5 mg by mouth daily.  . fenofibrate (TRICOR) 145 MG tablet Take 145 mg by mouth daily.  . ferrous sulfate 325 (65 FE) MG tablet Take 1 tablet (325 mg total) by mouth daily.  . finasteride (PROSCAR) 5 MG tablet Take 5 mg by mouth daily.  . furosemide (LASIX) 20 MG tablet Take 1 tablet (20 mg total) by mouth once for 1 dose.  Marland Kitchen lisinopril (ZESTRIL) 40 MG tablet Take 40 mg by mouth daily.  . magnesium oxide (MAG-OX) 400 MG tablet TAKE 1 TABLET BY MOUTH EVERY DAY  . metFORMIN (GLUCOPHAGE) 500 MG tablet Take 500 mg by mouth 2 (two) times daily with a meal.   . metoprolol succinate (TOPROL XL) 25 MG 24  hr tablet Take 1 tablet (25 mg total) by mouth daily.  . ondansetron (ZOFRAN) 8 MG tablet Take 1 tablet (8 mg total) by mouth 2 (two) times daily as needed. Start on the third day after chemotherapy.  . pantoprazole (PROTONIX) 40 MG tablet TAKE 1 TABLET BY MOUTH TWICE A DAY  . pravastatin (PRAVACHOL) 40 MG tablet Take 40 mg by mouth daily.  . prochlorperazine (COMPAZINE) 10 MG tablet Take 1 tablet (10 mg total) by mouth every 6 (six) hours as needed (Nausea or vomiting).  . sucralfate (CARAFATE) 1 g tablet Take 1 tablet (1 g total) by mouth every 6 (six) hours as needed.  . tamsulosin (FLOMAX) 0.4 MG CAPS capsule Take 0.4 mg by mouth daily.  . Vitamin D, Ergocalciferol, (DRISDOL) 1.25 MG (50000 UT) CAPS capsule Take 50,000 Units by mouth every 7 (seven) days.     No Known Allergies  Social History   Socioeconomic History  . Marital status: Married    Spouse name: Not on file  . Number of children: 3  . Years of education: Not on file  . Highest education level: Not on file  Occupational History  . Not on file  Tobacco Use  . Smoking status: Never Smoker  . Smokeless tobacco: Current User    Types: Chew  .  Tobacco comment: for 60 years, 1-2 daily   Substance and Sexual Activity  . Alcohol use: Not Currently    Comment: occasionally  . Drug use: Never  . Sexual activity: Not on file  Other Topics Concern  . Not on file  Social History Narrative  . Not on file   Social Determinants of Health   Financial Resource Strain:   . Difficulty of Paying Living Expenses:   Food Insecurity:   . Worried About Charity fundraiser in the Last Year:   . Arboriculturist in the Last Year:   Transportation Needs:   . Film/video editor (Medical):   Marland Kitchen Lack of Transportation (Non-Medical):   Physical Activity:   . Days of Exercise per Week:   . Minutes of Exercise per Session:   Stress:   . Feeling of Stress :   Social Connections:   . Frequency of Communication with Friends and  Family:   . Frequency of Social Gatherings with Friends and Family:   . Attends Religious Services:   . Active Member of Clubs or Organizations:   . Attends Archivist Meetings:   Marland Kitchen Marital Status:   Intimate Partner Violence:   . Fear of Current or Ex-Partner:   . Emotionally Abused:   Marland Kitchen Physically Abused:   . Sexually Abused:      Review of Systems: General: negative for chills, fever, night sweats or weight changes.  Cardiovascular: negative for chest pain, dyspnea on exertion, edema, orthopnea, palpitations, paroxysmal nocturnal dyspnea or shortness of breath Dermatological: negative for rash Respiratory: negative for cough or wheezing Urologic: negative for hematuria Abdominal: negative for nausea, vomiting, diarrhea, bright red blood per rectum, melena, or hematemesis Neurologic: negative for visual changes, syncope, or dizziness All other systems reviewed and are otherwise negative except as noted above.    Blood pressure 140/82, pulse 79, height 5\' 11"  (1.803 m), weight 225 lb 9.6 oz (102.3 kg), SpO2 94 %.  General appearance: alert and no distress Neck: no adenopathy, no carotid bruit, no JVD, supple, symmetrical, trachea midline and thyroid not enlarged, symmetric, no tenderness/mass/nodules Lungs: clear to auscultation bilaterally Heart: irregularly irregular rhythm Extremities: 1+ edema left lower extremity Pulses: 2+ and symmetric Skin: Skin color, texture, turgor normal. No rashes or lesions Neurologic: Alert and oriented X 3, normal strength and tone. Normal symmetric reflexes. Normal coordination and gait  EKG atrial fibrillation with ventricular response of 79, nonspecific ST-T wave changes and low limb voltage.  ASSESSMENT AND PLAN:   HYPERLIPIDEMIA History of hyperlipidemia on statin therapy .  We will check a lipid liver profile today  HYPERTENSION History of essential hypertension a blood pressure measured at 140/82.  He is on lisinopril and  metoprolol.  Atrial fibrillation [facility]) Mr. Holme was recently recognized to be in atrial fibrillation when a port was placed 2 months ago.  He has no history of A. fib since that time.  He was placed on Eliquis oral anticoagulation 3 weeks ago by his PCP.  Today he is in A. fib with controlled ventricular spots on metoprolol.  He apparently was put on 2.5 mg of Eliquis twice daily which is not a therapeutic dose.  We will start 5 mg twice daily and arrange cardioversion after 4 weeks.  Dyspnea on exertion History of dyspnea on exertion since starting chemotherapy for his liver cancer 6 months ago.  He was found to be in A. fib 2 months ago.  I am going to get  a 2D echo to further evaluate.  He has no prior history of tobacco abuse.  Coronary artery calcification seen on CT scan Mr. Wittman had coronary calcification seen on chest CT performed 12/24/2019.  Go to get a coronary calcium score to further quantitate.      Lorretta Harp MD FACP,FACC,FAHA, [facility] 03/24/2020 10:14 AM

## 2020-03-24 NOTE — Telephone Encounter (Signed)
Scheduled appt per 5/4 los.  Left a vm of the appt date and time. 

## 2020-03-26 DIAGNOSIS — R6889 Other general symptoms and signs: Secondary | ICD-10-CM | POA: Diagnosis not present

## 2020-03-26 DIAGNOSIS — J329 Chronic sinusitis, unspecified: Secondary | ICD-10-CM | POA: Diagnosis not present

## 2020-03-26 DIAGNOSIS — Z20822 Contact with and (suspected) exposure to covid-19: Secondary | ICD-10-CM | POA: Diagnosis not present

## 2020-03-27 ENCOUNTER — Other Ambulatory Visit: Payer: Self-pay | Admitting: Gastroenterology

## 2020-03-29 ENCOUNTER — Inpatient Hospital Stay: Payer: Medicare HMO

## 2020-03-29 ENCOUNTER — Inpatient Hospital Stay (HOSPITAL_BASED_OUTPATIENT_CLINIC_OR_DEPARTMENT_OTHER): Payer: Medicare HMO | Admitting: Nurse Practitioner

## 2020-03-29 ENCOUNTER — Other Ambulatory Visit: Payer: Medicare HMO

## 2020-03-29 ENCOUNTER — Telehealth: Payer: Self-pay | Admitting: Nurse Practitioner

## 2020-03-29 ENCOUNTER — Other Ambulatory Visit: Payer: Self-pay

## 2020-03-29 ENCOUNTER — Encounter: Payer: Self-pay | Admitting: Nurse Practitioner

## 2020-03-29 VITALS — BP 162/110 | HR 77 | Temp 98.5°F | Resp 18 | Ht 71.0 in | Wt 222.7 lb

## 2020-03-29 VITALS — BP 145/75 | HR 78

## 2020-03-29 DIAGNOSIS — I4891 Unspecified atrial fibrillation: Secondary | ICD-10-CM | POA: Diagnosis not present

## 2020-03-29 DIAGNOSIS — C221 Intrahepatic bile duct carcinoma: Secondary | ICD-10-CM

## 2020-03-29 DIAGNOSIS — Z7189 Other specified counseling: Secondary | ICD-10-CM

## 2020-03-29 DIAGNOSIS — R911 Solitary pulmonary nodule: Secondary | ICD-10-CM

## 2020-03-29 DIAGNOSIS — Z7901 Long term (current) use of anticoagulants: Secondary | ICD-10-CM

## 2020-03-29 DIAGNOSIS — R600 Localized edema: Secondary | ICD-10-CM | POA: Diagnosis not present

## 2020-03-29 DIAGNOSIS — R0609 Other forms of dyspnea: Secondary | ICD-10-CM | POA: Diagnosis not present

## 2020-03-29 DIAGNOSIS — D649 Anemia, unspecified: Secondary | ICD-10-CM | POA: Diagnosis not present

## 2020-03-29 DIAGNOSIS — R0602 Shortness of breath: Secondary | ICD-10-CM | POA: Diagnosis not present

## 2020-03-29 DIAGNOSIS — I10 Essential (primary) hypertension: Secondary | ICD-10-CM | POA: Diagnosis not present

## 2020-03-29 DIAGNOSIS — J90 Pleural effusion, not elsewhere classified: Secondary | ICD-10-CM

## 2020-03-29 DIAGNOSIS — Z5189 Encounter for other specified aftercare: Secondary | ICD-10-CM | POA: Diagnosis not present

## 2020-03-29 DIAGNOSIS — Z87891 Personal history of nicotine dependence: Secondary | ICD-10-CM | POA: Diagnosis not present

## 2020-03-29 DIAGNOSIS — Z7984 Long term (current) use of oral hypoglycemic drugs: Secondary | ICD-10-CM

## 2020-03-29 DIAGNOSIS — E119 Type 2 diabetes mellitus without complications: Secondary | ICD-10-CM

## 2020-03-29 DIAGNOSIS — Z5111 Encounter for antineoplastic chemotherapy: Secondary | ICD-10-CM | POA: Diagnosis not present

## 2020-03-29 LAB — CBC WITH DIFFERENTIAL (CANCER CENTER ONLY)
Abs Immature Granulocytes: 0 10*3/uL (ref 0.00–0.07)
Basophils Absolute: 0.1 10*3/uL (ref 0.0–0.1)
Basophils Relative: 2 %
Eosinophils Absolute: 0.1 10*3/uL (ref 0.0–0.5)
Eosinophils Relative: 3 %
HCT: 30.3 % — ABNORMAL LOW (ref 39.0–52.0)
Hemoglobin: 9.9 g/dL — ABNORMAL LOW (ref 13.0–17.0)
Immature Granulocytes: 0 %
Lymphocytes Relative: 36 %
Lymphs Abs: 0.8 10*3/uL (ref 0.7–4.0)
MCH: 33.7 pg (ref 26.0–34.0)
MCHC: 32.7 g/dL (ref 30.0–36.0)
MCV: 103.1 fL — ABNORMAL HIGH (ref 80.0–100.0)
Monocytes Absolute: 0.3 10*3/uL (ref 0.1–1.0)
Monocytes Relative: 15 %
Neutro Abs: 0.9 10*3/uL — ABNORMAL LOW (ref 1.7–7.7)
Neutrophils Relative %: 44 %
Platelet Count: 157 10*3/uL (ref 150–400)
RBC: 2.94 MIL/uL — ABNORMAL LOW (ref 4.22–5.81)
RDW: 16.6 % — ABNORMAL HIGH (ref 11.5–15.5)
WBC Count: 2.1 10*3/uL — ABNORMAL LOW (ref 4.0–10.5)
nRBC: 0 % (ref 0.0–0.2)

## 2020-03-29 LAB — CMP (CANCER CENTER ONLY)
ALT: 24 U/L (ref 0–44)
AST: 37 U/L (ref 15–41)
Albumin: 3.5 g/dL (ref 3.5–5.0)
Alkaline Phosphatase: 64 U/L (ref 38–126)
Anion gap: 11 (ref 5–15)
BUN: 14 mg/dL (ref 8–23)
CO2: 22 mmol/L (ref 22–32)
Calcium: 9.3 mg/dL (ref 8.9–10.3)
Chloride: 107 mmol/L (ref 98–111)
Creatinine: 1.16 mg/dL (ref 0.61–1.24)
GFR, Est AFR Am: >60 mL/min (ref >60)
GFR, Estimated: >60 mL/min (ref >60)
Glucose, Bld: 94 mg/dL (ref 70–99)
Potassium: 3.7 mmol/L (ref 3.5–5.1)
Sodium: 140 mmol/L (ref 135–145)
Total Bilirubin: 0.6 mg/dL (ref 0.3–1.2)
Total Protein: 6.4 g/dL — ABNORMAL LOW (ref 6.5–8.1)

## 2020-03-29 LAB — MAGNESIUM: Magnesium: 1.2 mg/dL — CL (ref 1.7–2.4)

## 2020-03-29 MED ORDER — SODIUM CHLORIDE 0.9 % IV SOLN
1000.0000 mg/m2 | Freq: Once | INTRAVENOUS | Status: AC
  Administered 2020-03-29: 2166 mg via INTRAVENOUS
  Filled 2020-03-29: qty 56.97

## 2020-03-29 MED ORDER — FUROSEMIDE 10 MG/ML IJ SOLN
10.0000 mg | Freq: Once | INTRAMUSCULAR | Status: AC
  Administered 2020-03-29: 10 mg via INTRAVENOUS

## 2020-03-29 MED ORDER — LIDOCAINE-PRILOCAINE 2.5-2.5 % EX CREA
1.0000 | TOPICAL_CREAM | CUTANEOUS | 3 refills | Status: DC | PRN
Start: 2020-03-29 — End: 2021-04-04

## 2020-03-29 MED ORDER — PROCHLORPERAZINE MALEATE 10 MG PO TABS
ORAL_TABLET | ORAL | Status: AC
  Filled 2020-03-29: qty 1

## 2020-03-29 MED ORDER — SODIUM CHLORIDE 0.9 % IV SOLN
Freq: Once | INTRAVENOUS | Status: AC
  Filled 2020-03-29: qty 250

## 2020-03-29 MED ORDER — MAGNESIUM SULFATE 4 GM/100ML IV SOLN
4.0000 g | Freq: Once | INTRAVENOUS | Status: AC
  Administered 2020-03-29: 4 g via INTRAVENOUS
  Filled 2020-03-29: qty 100

## 2020-03-29 MED ORDER — PROCHLORPERAZINE MALEATE 10 MG PO TABS
10.0000 mg | ORAL_TABLET | Freq: Once | ORAL | Status: AC
  Administered 2020-03-29: 10 mg via ORAL

## 2020-03-29 MED ORDER — HEPARIN SOD (PORK) LOCK FLUSH 100 UNIT/ML IV SOLN
500.0000 [IU] | Freq: Once | INTRAVENOUS | Status: AC | PRN
  Administered 2020-03-29: 500 [IU]
  Filled 2020-03-29: qty 5

## 2020-03-29 MED ORDER — FUROSEMIDE 10 MG/ML IJ SOLN
INTRAMUSCULAR | Status: AC
  Filled 2020-03-29: qty 2

## 2020-03-29 MED ORDER — SODIUM CHLORIDE 0.9% FLUSH
10.0000 mL | INTRAVENOUS | Status: DC | PRN
  Administered 2020-03-29: 10 mL
  Filled 2020-03-29: qty 10

## 2020-03-29 NOTE — Telephone Encounter (Signed)
Scheduled appt per 5/10 sch message - left message for patient with appt date and time   

## 2020-03-29 NOTE — Progress Notes (Signed)
Per Regan Rakers, NP: okay to treat with ANC of 0.9.  Pt. getting only Gemcitabine today. Also, okay to treat with elevated BP, recheck before pt. Leaves. Missed his BP pills this morning.

## 2020-03-29 NOTE — Progress Notes (Addendum)
Per Lacie, gemcitabine only today. Pt still to receive furosemide 10mg  IV today for fluid status in addition to his home furosemide. Continue GCSF for low ANC D9.   For Mg level of 1.2, give magnesium sulfate 4 gm IV today in infusion. Patient is also to increase oral magnesium supplement at home from once daily to TID. Patient educated on this change and med list updated.  Demetrius Charity, PharmD, BCPS, [facility] Pharmacist Pharmacy Phone: [phone] 03/29/2020

## 2020-03-29 NOTE — Progress Notes (Addendum)
[place]   [phone] [phone]   Clinic Follow up Note   Patient Care Team: [name], PA as PCP - General (Family Medicine) [name], MD as Consulting Physician (General Surgery) [name], [name], MD as Consulting Physician (Gastroenterology) [name], MD as Consulting Physician (Hematology) 03/29/2020  CHIEF COMPLAINT: F/u cholangiocarcinoma   SUMMARY OF ONCOLOGIC HISTORY: Oncology History Overview Note  Cancer Staging Intrahepatic cholangiocarcinoma ([place]) Staging form: Intrahepatic Bile Duct, AJCC 8th Edition - Clinical stage from 08/19/2019: Stage II (cT2, cN0, cM0) - Signed by [name], MD on 08/19/2019    Intrahepatic cholangiocarcinoma ([place])  06/28/2019 Imaging   CT AP W Contrast 06/28/19  IMPRESSION: 1. Heterogeneous hypodensity posteriorly in the right hepatic lobe and potentially extending into the caudate lobe suspicious for a mass. There is felt to be truncation of branches of the portal vein in this vicinity and some narrowing of the hepatic vein, as well as triangular-shaped regions of abnormal hypoenhancement posteriorly in the right hepatic lobe likely representing downstream vascular effects. Cannot exclude malignancy such as cholangiocarcinoma or hepatocellular carcinoma, and follow up hepatic protocol MRI with and without contrast is recommended to further characterize. 2. 4 mm right middle lobe pulmonary nodule is likely benign but may merit surveillance. 3. Cholelithiasis. 4.  Aortic Atherosclerosis (ICD10-I70.0). 5. Prostatomegaly. 6. Mild impingement at L3-4 and L4-5.   07/31/2019 Imaging   MRI Liver 07/31/19 IMPRESSION: 1. 7.3 cm in long axis mass in the right hepatic lobe spanning into the caudate lobe, high suspicion for malignancy such as hepatocellular carcinoma or cholangiocarcinoma. Suspected effacement or occlusion of the right hepatic vein and posterior branches of the right portal vein.  Two smaller tumor nodules along the posterior periphery of the dominant mass. Tissue diagnosis is recommended. 2. No findings of pathologic adenopathy or distant metastatic spread. 3. 9 mm gallstone in the gallbladder. There is mild gallbladder wall thickening which may be from nondistention, correlate clinically in assessing for cholecystitis. 4.  Aortic Atherosclerosis (ICD10-I70.0). 5. Mild diffuse hepatic steatosis.   08/11/2019 Initial Biopsy   DIAGNOSIS: 08/11/19  A. LIVER, RIGHT, BIOPSY:  - Adenocarcinoma.   08/18/2019 Imaging   CT Chest 08/18/19  IMPRESSION: 1. Multiple pulmonary nodules largest at approximately 7 mm in the right lower lobe, nonspecific but concerning given findings in the liver. 2. No signs of definitive metastatic disease, also with mildly enlarged upper abdominal lymph nodes as discussed.   Aortic Atherosclerosis (ICD10-I70.0).   08/19/2019 Initial Diagnosis   Intrahepatic cholangiocarcinoma ([place])   08/19/2019 Cancer Staging   Staging form: Intrahepatic Bile Duct, AJCC 8th Edition - Clinical stage from 08/19/2019: Stage II (cT2, cN0, cM0) - Signed by [name], MD on 08/19/2019   09/29/2019 -  Chemotherapy   Cisplatin and Gemcitabine 2 weeks on/1 week off starting 09/29/19    10/09/2019 Imaging   CT AP IMPRESSION: 1. The dominant right hepatic lobe mass is minimally reduced in size compared to prior exams, currently measuring 6.2 by 5.3 cm, previously 6.2 by 5.6 cm. However, there is a new small hypodense lesion centrally in the right hepatic lobe which is suspicious for a new small focus of tumor. Accordingly this is an overall mixed appearance. 2. Continued hypoenhancement in the liver downstream of the tumor likely attributable to narrowing or occlusion of the right hepatic vein by the tumor. By virtue of its location the tumor wraps around the intrahepatic portion of the IVC. 3. 4 mm right middle lobe pulmonary nodule, stable  compared to earliest  available comparison of 07/18/2019. Surveillance of the patient's pulmonary nodules is recommended. 4. Other imaging findings of potential clinical significance: Coronary atherosclerosis. Cholelithiasis. Prominent stool throughout the colon favors constipation. Moderate prostatomegaly with heterogeneous enhancement of the prostate gland. Lumbar spondylosis and degenerative disc disease causing mild bilateral foraminal impingement at L3-4 and L4-5.   Aortic Atherosclerosis (ICD10-I70.0).   12/24/2019 Imaging   CT CAP W Contrast  IMPRESSION: 1. Right hepatic lobe mass and adjacent right hepatic lobe nodules appear grossly stable. No evidence of distant metastatic disease. 2. Continued stability of small pulmonary nodules. Recommend attention on follow-up. 3. Cholelithiasis. 4. Enlarged prostate. 5. Aortic atherosclerosis (ICD10-I70.0). Coronary artery calcification.     CURRENT THERAPY: Cisplatin and Gemcitabine 2 weeks on/1 week off starting 09/29/19. Cisplatin held from cycle 9 due to fluid status.   INTERVAL HISTORY: [name] returns for f/u and treatment as scheduled. He completed cycle 9 day 1 on 03/30/20 and received gemcitabine only due to dyspnea and concern for fluid overload in clinic last week. He has since seen cardiology Dr. Gwenlyn Found and was started on lasix, eliquis was also increased.   Today he feels much better. Dyspnea improved, now only exertional. Leg edema also much better. He was started on an antibiotic per his family doctor for sinus drainage and that also is improving. Minimal cough with clear sputum which he had prior is unchanged. No fever, chills. Appetite and activity are adequate. Drinking well. Has not taken BP meds today. Denies mucositis, rash, n/v/c/d, pain, or neuropathy.     MEDICAL HISTORY:  Past Medical History:  Diagnosis Date  . Arthritis   . Diabetes ([place])   . GERD (gastroesophageal reflux disease)   . Hyperlipidemia   . Hypertension   .  Intrahepatic cholangiocarcinoma ([place])     SURGICAL HISTORY: Past Surgical History:  Procedure Laterality Date  . APPENDECTOMY  1980  . COLONOSCOPY    . IR IMAGING GUIDED PORT INSERTION  02/23/2020    I have reviewed the social history and family history with the patient and they are unchanged from previous note.  ALLERGIES:  has No Known Allergies.  MEDICATIONS:  Current Outpatient Medications  Medication Sig Dispense Refill  . allopurinol (ZYLOPRIM) 100 MG tablet Take 100 mg by mouth daily.    Marland Kitchen amLODipine (NORVASC) 2.5 MG tablet Take 2.5 mg by mouth daily.    Marland Kitchen apixaban (ELIQUIS) 5 MG TABS tablet Take 1 tablet (5 mg total) by mouth 2 (two) times daily. 180 tablet 3  . fenofibrate (TRICOR) 145 MG tablet Take 145 mg by mouth daily.    . finasteride (PROSCAR) 5 MG tablet Take 5 mg by mouth daily.    . furosemide (LASIX) 20 MG tablet Take 1 tablet (20 mg total) by mouth every other day. 45 tablet 3  . lisinopril (ZESTRIL) 40 MG tablet Take 40 mg by mouth daily.    . magnesium oxide (MAG-OX) 400 MG tablet TAKE 1 TABLET BY MOUTH EVERY DAY 30 tablet 0  . metFORMIN (GLUCOPHAGE) 500 MG tablet Take 500 mg by mouth 2 (two) times daily with a meal.     . metoprolol succinate (TOPROL-XL) 25 MG 24 hr tablet TAKE 1 TABLET BY MOUTH EVERY DAY 30 tablet 0  . ondansetron (ZOFRAN) 8 MG tablet Take 1 tablet (8 mg total) by mouth 2 (two) times daily as needed. Start on the third day after chemotherapy. 30 tablet 1  . pantoprazole (PROTONIX) 40 MG tablet TAKE 1 TABLET  BY MOUTH TWICE A DAY 180 tablet 0  . pravastatin (PRAVACHOL) 40 MG tablet Take 40 mg by mouth daily.    . prochlorperazine (COMPAZINE) 10 MG tablet Take 1 tablet (10 mg total) by mouth every 6 (six) hours as needed (Nausea or vomiting). 30 tablet 1  . tamsulosin (FLOMAX) 0.4 MG CAPS capsule Take 0.4 mg by mouth daily.    . Vitamin D, Ergocalciferol, (DRISDOL) 1.25 MG (50000 UT) CAPS capsule Take 50,000 Units by mouth every 7 (seven) days.      . ferrous sulfate 325 (65 FE) MG tablet Take 1 tablet (325 mg total) by mouth daily. 30 tablet 0  . lidocaine-prilocaine (EMLA) cream Apply 1 application topically as needed. 30 g 3  . sucralfate (CARAFATE) 1 g tablet TAKE 1 TABLET BY MOUTH EVERY 6 HOURS AS NEEDED. 180 tablet 0   No current facility-administered medications for this visit.   Facility-Administered Medications Ordered in Other Visits  Medication Dose Route Frequency Provider Last Rate Last Admin  . gemcitabine (GEMZAR) 2,166 mg in sodium chloride 0.9 % 250 mL chemo infusion  1,000 mg/m2 (Treatment Plan Recorded) Intravenous Once [name], MD 614 mL/hr at 03/29/20 0956 2,166 mg at 03/29/20 0956  . heparin lock flush 100 unit/mL  500 Units Intracatheter Once PRN [name], MD      . sodium chloride flush (NS) 0.9 % injection 10 mL  10 mL Intracatheter PRN [name], MD        PHYSICAL EXAMINATION: ECOG PERFORMANCE STATUS: 1 - Symptomatic but completely ambulatory  Vitals:   03/29/20 0819  BP: (!) 162/110  Pulse: 77  Resp: 18  Temp: 98.5 F (36.9 C)  SpO2: 98%   Filed Weights   03/29/20 0819  Weight: 222 lb 11.2 oz (101 kg)    GENERAL:alert, no distress and comfortable SKIN: skin color, texture, turgor are normal, no rashes or significant lesions EYES: normal, Conjunctiva are pink and non-injected, sclera clear OROPHARYNX:no exudate, no erythema and lips, buccal mucosa, and tongue normal  NECK: supple, thyroid normal size, non-tender, without nodularity LYMPH:  no palpable lymphadenopathy in the cervical, axillary or inguinal LUNGS: clear to auscultation and percussion with normal breathing effort HEART: regular rate & rhythm and no murmurs and no lower extremity edema ABDOMEN:abdomen soft, non-tender and normal bowel sounds Musculoskeletal:no cyanosis of digits and no clubbing  NEURO: alert & oriented x 3 with fluent speech, no focal motor/sensory deficits  LABORATORY DATA:  I have reviewed the data as  listed CBC Latest Ref Rng & Units 03/29/2020 03/23/2020 03/17/2020  WBC 4.0 - 10.5 K/uL 2.1(L) 4.5 6.5  Hemoglobin 13.0 - 17.0 g/dL 9.9(L) 10.4(L) 7.6(L)  Hematocrit 39.0 - 52.0 % 30.3(L) 32.2(L) 23.8(L)  Platelets 150 - 400 K/uL 157 173 70(L)     CMP Latest Ref Rng & Units 03/29/2020 03/24/2020 03/23/2020  Glucose 70 - 99 mg/dL 94 - 103(H)  BUN 8 - 23 mg/dL 14 - 16  Creatinine 0.61 - 1.24 mg/dL 1.16 - 1.38(H)  Sodium 135 - 145 mmol/L 140 - 141  Potassium 3.5 - 5.1 mmol/L 3.7 - 4.4  Chloride 98 - 111 mmol/L 107 - 110  CO2 22 - 32 mmol/L 22 - 23  Calcium 8.9 - 10.3 mg/dL 9.3 - 9.2  Total Protein 6.5 - 8.1 g/dL 6.4(L) 6.1 6.5  Total Bilirubin 0.3 - 1.2 mg/dL 0.6 1.0 0.9  Alkaline Phos 38 - 126 U/L 64 56 56  AST 15 - 41 U/L 37 19 21  ALT 0 - 44 U/L 24 9 13       RADIOGRAPHIC STUDIES: I have personally reviewed the radiological images as listed and agreed with the findings in the report. No results found.   ASSESSMENT & PLAN: Brandon Filak Sizemoreis a 74y.o.malewith    1.Intrahepatic cholangiocarcinoma, cT2N0Mx,unresectable,with indeterminate lung nodules -Diagnosed in9/2020.CT scans and MRI liver showa large7.3cm mass in the right hepatic lobe whichabutsportal vein.Both our local surgeon Dr. Barry Dienes andDr Carlis Abbott at [facility] concluded that cancer is not resectable, and therefore not likely curable,due to the invasion to portal vein. -He beganpalliativesystemic treatment standardfirst line chemo with IV Cisplatin and Gemcitabine 2 weeks on/1 week off.Started 09/29/19, tolerating well -Restaging CT CAP on 12/24/19 was stable, continues cisplatin/gemcitabine day 1 and 8 q21 days with GCSF -Mr. Weiland appears stable. He completed 8 cycles gem/cisplatin and received C9D1 gem only due to fluid status and concern for heart failure (BNP >500 in clinic). He continues to tolerate chemo very well. Except his dyspnea he has no other symptoms.  -Labs reviewed, Cr normalized. ANC 0.9. Due to  renal dysfunction and concern for fluid overload on cisplatin, we will hold cisplatin and hydration fluids from this cycle. He will proceed with C9D8 gemcitabine alone. He will return for day 9 GCSF tomorrow.  -Mg 1.2 secondary to cisplatin. K is normal. He will increase his oral dose to TID plus mag run today -He is scheduled for restaging CT CAP on 5/20. Mr. Dolson was seen with Dr. Burr Medico today who reviewed his goals of care, the treatment goal, and possible treatment scenarios pending the results of his scan.  -If he has stable disease, we discussed maintenance gemcitabine vs changing to GEMOX. If he has disease progression, the recommendation will likely be to change to FOLFOX. We reviewed the side effects of both regimens. The patient just wants to feel better, he is interested in quality of life. He understands the goal will remain palliative, to prolong his life and improve quality of life. She understands his cancer is not resectable and therefore not curable  -we will see him back on 5/24 to review results, he was encouraged to bring his wife.   2. Acute dyspnea and orthopnea  -He was symptomatic from his anemia hgb 7.6 and he received 2 units pRBCs on 4/29. Hgb improved to 10.4  -3 days after RBC transfusion he developed dyspnea at rest and orthopnea. He desats to 88% in clinic with ambulation  -CXR on 03/23/20 showed small pleural effusions, not likely contributing. No signs of infection. BNP 546 which indicates cardiac etiology  -He had consult with Dr. Gwenlyn Found on 03/24/20, he started lasix every other day and dyspnea and leg edema have improved.  Further cardiac work up is pending.  -His PCP placed him on an antibiotic for nasal congestion and post nasal drip. He will complete the course  -improved, now only exertional dyspnea. Continue monitoring  3.Symptomatic anemia -he developed exertional dyspnea and fatigue after cycle 8 chemo -he denies bleeding, this is likely secondary to  chemo-induced anemia Hgb 7.6 -ED recommended oral iron, have not checked iron studies lately  -on anticoagulation for new Afib, PLT 70 OK to continue. We reviewed bleeding and fall precautions  -He was symptomatic from his anemia and he received 2 units pRBCs on 4/29. He developed more dyspnea after RBC transfusion, likely fluid status  -dyspnea improved   4. New onset Afib -diagnosed after PAC placement on 02/23/20. -CHADS2 score 2, moderate risk for stroke. Started on metoprolol  and eliquis on 03/01/20. Tolerating well without bleeding.  -Had consult with Dr. Gwenlyn Found last week, further cardiac work up is pending. He was started on lasix 20 mg every other day. His dyspnea and leg edema have improved  5. Nausea, Low food intakeand weight loss -With pain from GERD and nausea he initially lost 30 pounds. -followed by dietician -weight fluctuates on treatment, some component of fluid retention in lower extremities -monitoring  6. DM, HTN, HLD, Gout  -On Metformin, amlodipine, lisinopril., allopurinol  -Continue to f/u with his PCP -recently added metoprolol and Eliquis for newly diagnosed Afib  7. Nicotine Use -He never smoked but has been chewing Tobacco for the past 60 years.He no longer drinks alcoholand tells me he has quit smoking (11/25/19).  8.GERDand gastritis, history ofesophageal candidiasis -Hehad repeated EGDwith Dr. Havery Moros in 07/2019. 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 Gwenlyn Found  -Complete antibiotic per PCP (sinusitis ?)  No problem-specific Assessment & Plan notes found for this encounter.   Orders Placed This Encounter  Procedures  . Magnesium    Standing Status:   Future    Number of Occurrences:   1    Standing Expiration Date:   03/29/2021   All questions were answered. The patient knows to call the clinic with any problems, questions or concerns. No barriers to learning was detected.     Alla Feeling, NP 03/29/20    Addendum  I have seen the patient, examined him. I agree with the assessment and and plan and have edited the notes.   Mr. Marinoff has developed dyspnea on exertion lately, which is likely related to his heart failure.  He was evaluated by cardiologist Dr. Gwenlyn Found last week, and symptoms have improved with diuretics.  Due to the heart failure, I will hold on cisplatin due to the large fluid volume requirement.  He is scheduled to have restaging scan next week, will make a decision about his chemo treatment after that.  If his restaging scan shows no cancer progression, will change his treatment to gemcitabine maintenance therapy.  We discussed other treatment options, such as gemcitabine and cisplatin, or FOLFOX.  Patient has developed chemo related side effects, mainly fatigue, and would like to have low intensity chemo to preserve her quality of life.  Lab reviewed, adequate for gemcitabine alone today, will proceed.  I will see him back after his scan.  [name]  03/29/2020

## 2020-03-29 NOTE — Patient Instructions (Addendum)
[facility] Discharge Instructions for Patients Receiving Chemotherapy  Today you received the following chemotherapy agents Gemcitabine (GEMZAR).  To help prevent nausea and vomiting after your treatment, we encourage you to take your nausea medication as prescribed.   If you develop nausea and vomiting that is not controlled by your nausea medication, call the clinic.   BELOW ARE SYMPTOMS THAT SHOULD BE REPORTED IMMEDIATELY:  *FEVER GREATER THAN 100.5 F  *CHILLS WITH OR WITHOUT FEVER  NAUSEA AND VOMITING THAT IS NOT CONTROLLED WITH YOUR NAUSEA MEDICATION  *UNUSUAL SHORTNESS OF BREATH  *UNUSUAL BRUISING OR BLEEDING  TENDERNESS IN MOUTH AND THROAT WITH OR WITHOUT PRESENCE OF ULCERS  *URINARY PROBLEMS  *BOWEL PROBLEMS  UNUSUAL RASH Items with * indicate a potential emergency and should be followed up as soon as possible.  Feel free to call the clinic should you have any questions or concerns. The clinic phone number is [phone].  Please show the [facility] at check-in to the Emergency Department and triage nurse.  Magnesium Sulfate injection What is this medicine? MAGNESIUM SULFATE (mag NEE zee um SUL fate) is an electrolyte injection commonly used to treat low magnesium levels in your blood. It is also used to prevent or control seizures in women with preeclampsia or eclampsia. This medicine may be used for other purposes; ask your health care provider or pharmacist if you have questions. What should I tell my health care provider before I take this medicine? They need to know if you have any of these conditions:  heart disease  history of irregular heart beat  kidney disease  an unusual or allergic reaction to magnesium sulfate, medicines, foods, dyes, or preservatives  pregnant or trying to get pregnant  breast-feeding How should I use this medicine? This medicine is for infusion into a vein. It is given by a health care professional in  a hospital or clinic setting. Talk to your pediatrician regarding the use of this medicine in children. While this drug may be prescribed for selected conditions, precautions do apply. Overdosage: If you think you have taken too much of this medicine contact a poison control center or emergency room at once. NOTE: This medicine is only for you. Do not share this medicine with others. What if I miss a dose? This does not apply. What may interact with this medicine? This medicine may interact with the following medications:  certain medicines for anxiety or sleep  certain medicines for seizures like phenobarbital  digoxin  medicines that relax muscles for surgery  narcotic medicines for pain This list may not describe all possible interactions. Give your health care provider a list of all the medicines, herbs, non-prescription drugs, or dietary supplements you use. Also tell them if you smoke, drink alcohol, or use illegal drugs. Some items may interact with your medicine. What should I watch for while using this medicine? Your condition will be monitored carefully while you are receiving this medicine. You may need blood work done while you are receiving this medicine. What side effects may I notice from receiving this medicine? Side effects that you should report to your doctor or health care professional as soon as possible:  allergic reactions like skin rash, itching or hives, swelling of the face, lips, or tongue  facial flushing  muscle weakness  signs and symptoms of low blood pressure like dizziness; feeling faint or lightheaded, falls; unusually weak or tired  signs and symptoms of a dangerous change in heartbeat or heart  rhythm like chest pain; dizziness; fast or irregular heartbeat; palpitations; breathing problems  sweating This list may not describe all possible side effects. Call your doctor for medical advice about side effects. You may report side effects to FDA at  1-800-FDA-1088. Where should I keep my medicine? This drug is given in a hospital or clinic and will not be stored at home. NOTE: This sheet is a summary. It may not cover all possible information. If you have questions about this medicine, talk to your doctor, pharmacist, or health care provider.  2020 Elsevier/Gold Standard (2016-05-24 12:31:42)

## 2020-03-29 NOTE — Progress Notes (Signed)
High priority scheduling message sent for fulphila injection appt. on Wednesday.  Scheduling to call patient with appt. time.

## 2020-03-30 ENCOUNTER — Telehealth: Payer: Self-pay | Admitting: Nurse Practitioner

## 2020-03-30 NOTE — Telephone Encounter (Signed)
Per 5/10 los, it stated to schedule the injection for 5/11, but it was one already scheduled for 5/12  Per MD it was okay to keep it scheduled for 5/12.  Called pt and left a vm of that 5/12 injection appt date and time.

## 2020-03-31 ENCOUNTER — Other Ambulatory Visit: Payer: Self-pay

## 2020-03-31 ENCOUNTER — Other Ambulatory Visit: Payer: Self-pay | Admitting: Nurse Practitioner

## 2020-03-31 ENCOUNTER — Inpatient Hospital Stay: Payer: Medicare HMO

## 2020-03-31 VITALS — BP 163/89 | HR 84 | Temp 98.3°F | Resp 20

## 2020-03-31 DIAGNOSIS — Z5111 Encounter for antineoplastic chemotherapy: Secondary | ICD-10-CM | POA: Diagnosis not present

## 2020-03-31 DIAGNOSIS — R0602 Shortness of breath: Secondary | ICD-10-CM | POA: Diagnosis not present

## 2020-03-31 DIAGNOSIS — Z87891 Personal history of nicotine dependence: Secondary | ICD-10-CM | POA: Diagnosis not present

## 2020-03-31 DIAGNOSIS — R600 Localized edema: Secondary | ICD-10-CM | POA: Diagnosis not present

## 2020-03-31 DIAGNOSIS — C221 Intrahepatic bile duct carcinoma: Secondary | ICD-10-CM

## 2020-03-31 DIAGNOSIS — Z5189 Encounter for other specified aftercare: Secondary | ICD-10-CM | POA: Diagnosis not present

## 2020-03-31 DIAGNOSIS — Z7189 Other specified counseling: Secondary | ICD-10-CM

## 2020-03-31 DIAGNOSIS — J90 Pleural effusion, not elsewhere classified: Secondary | ICD-10-CM | POA: Diagnosis not present

## 2020-03-31 MED ORDER — PEGFILGRASTIM-JMDB 6 MG/0.6ML ~~LOC~~ SOSY
PREFILLED_SYRINGE | SUBCUTANEOUS | Status: AC
  Filled 2020-03-31: qty 0.6

## 2020-03-31 MED ORDER — PEGFILGRASTIM-JMDB 6 MG/0.6ML ~~LOC~~ SOSY
6.0000 mg | PREFILLED_SYRINGE | Freq: Once | SUBCUTANEOUS | Status: AC
  Administered 2020-03-31: 6 mg via SUBCUTANEOUS

## 2020-03-31 NOTE — Patient Instructions (Signed)

## 2020-04-06 NOTE — Progress Notes (Signed)
Pharmacist Chemotherapy Monitoring - Follow Up Assessment    I verify that I have reviewed each item in the below checklist:  . Regimen for the patient is scheduled for the appropriate day and plan matches scheduled date. Marland Kitchen Appropriate non-routine labs are ordered dependent on drug ordered. . If applicable, additional medications reviewed and ordered per protocol based on lifetime cumulative doses and/or treatment regimen.   Plan for follow-up and/or issues identified: No . I-vent associated with next due treatment: No . MD and/or nursing notified: No  [place]omen[place] Course 04/06/2020 11:17 AM

## 2020-04-08 ENCOUNTER — Ambulatory Visit (HOSPITAL_COMMUNITY)
Admission: RE | Admit: 2020-04-08 | Discharge: 2020-04-08 | Disposition: A | Discharge status: Home / Self Care | Payer: Medicare HMO | Source: Ambulatory Visit | Attending: Nurse Practitioner | Admitting: Nurse Practitioner

## 2020-04-08 ENCOUNTER — Encounter (HOSPITAL_COMMUNITY): Payer: Self-pay

## 2020-04-08 ENCOUNTER — Other Ambulatory Visit: Payer: Self-pay

## 2020-04-08 DIAGNOSIS — C221 Intrahepatic bile duct carcinoma: Secondary | ICD-10-CM

## 2020-04-08 IMAGING — CT CT ABD-PELV W/ CM
2 of 5 series · 12 of 36 positions shown, 15 images · IV contrast (APPLIED)
Comparison: [DATE], [DATE]

CLINICAL DATA: Cholangiocarcinoma restaging

EXAM:
CT CHEST, ABDOMEN, AND PELVIS WITH CONTRAST
TECHNIQUE: Multidetector CT imaging of the chest, abdomen and pelvis was
performed following the standard protocol during bolus
administration of intravenous contrast.
CONTRAST:  100mL OMNIPAQUE IOHEXOL 300 MG/ML SOLN, additional oral
enteric contrast

[Series 2: cap with · axial · 0.88mm/px · z∈[-628,-88]mm · 9 of 136 slices shown, 12 images]
[im 14/136  mediastinal]
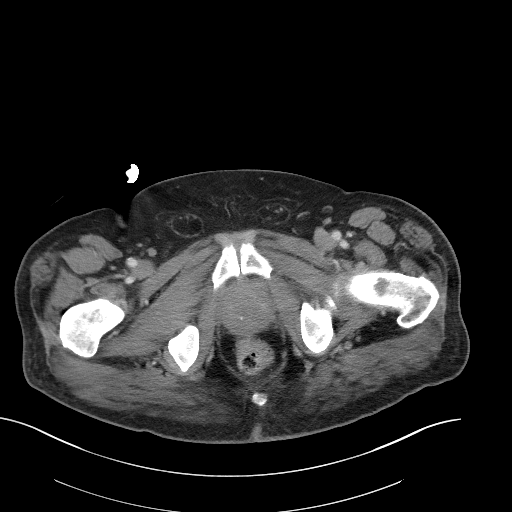
[im 14/136  lung]
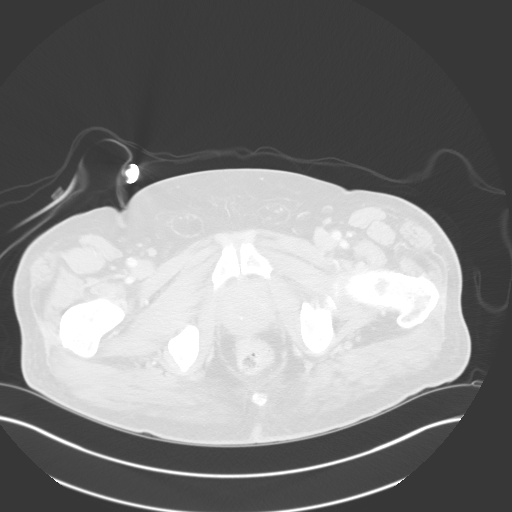
[im 28/136  lung]
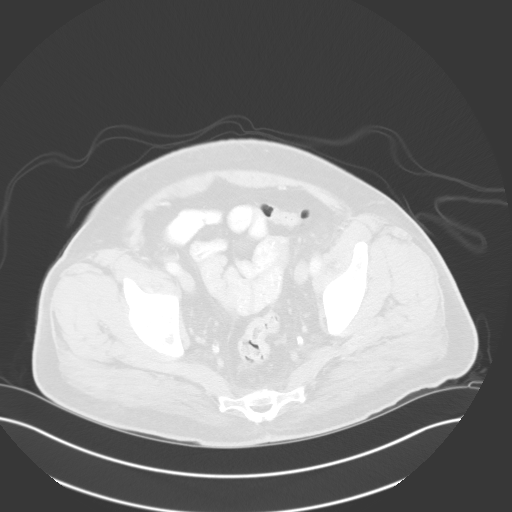
[im 41/136  lung]
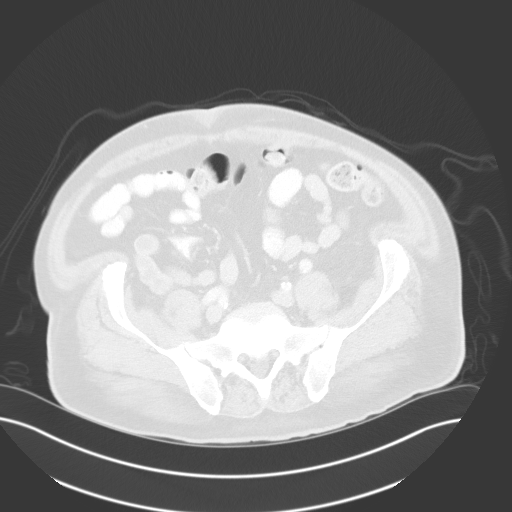
[im 55/136  lung]
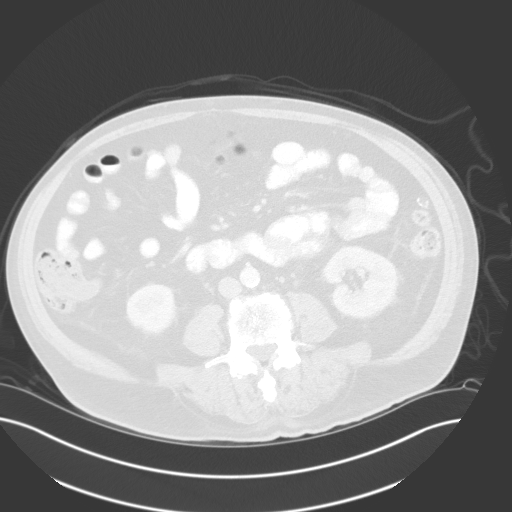
[im 68/136  mediastinal]
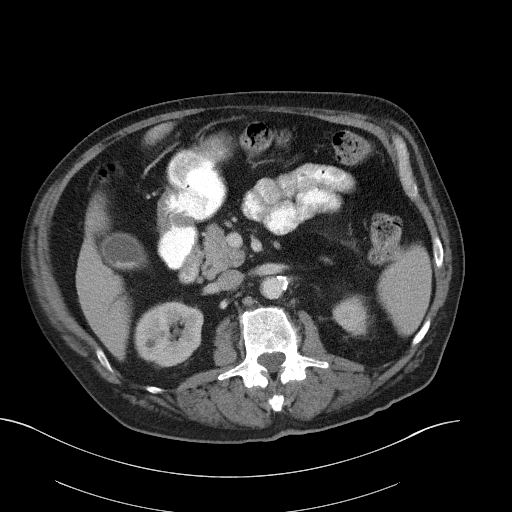
[im 68/136  lung]
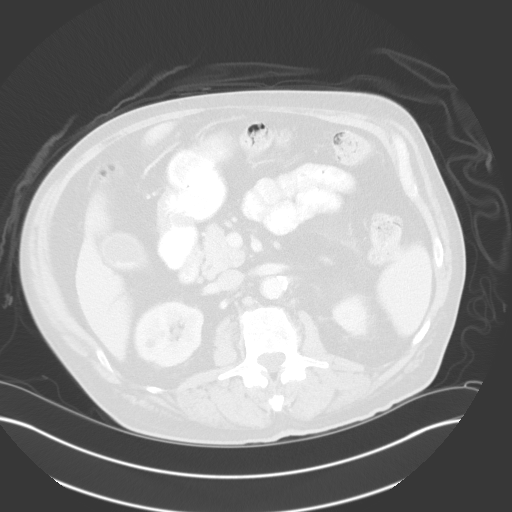
[im 82/136  lung]
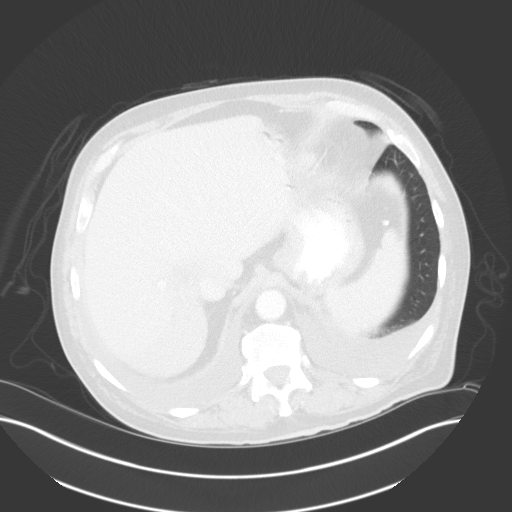
[im 95/136  lung]
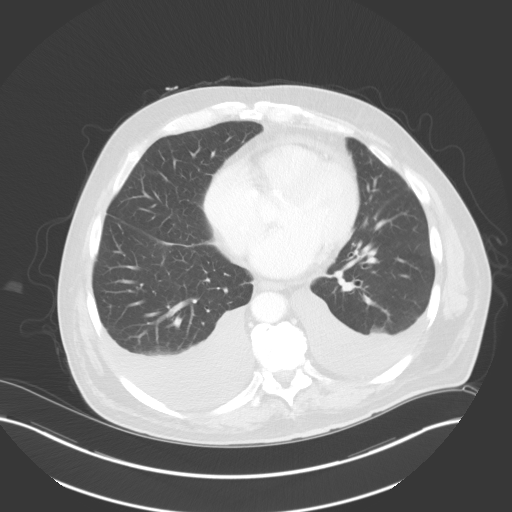
[im 109/136  lung]
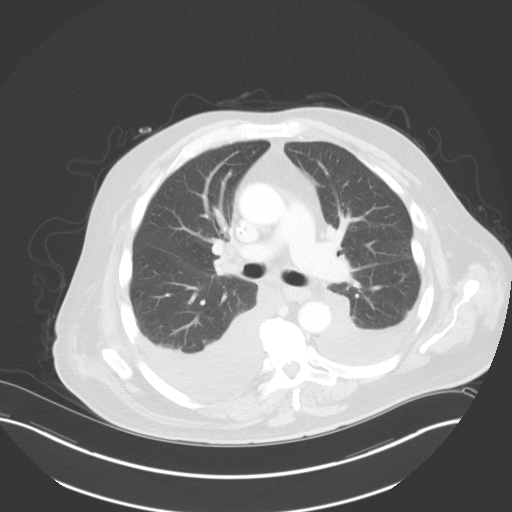
[im 122/136  mediastinal]
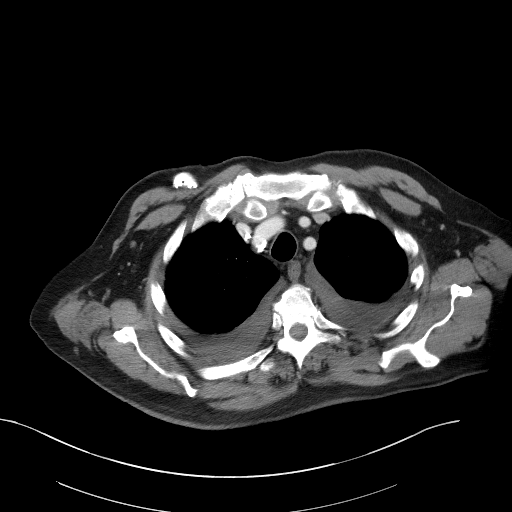
[im 122/136  lung]
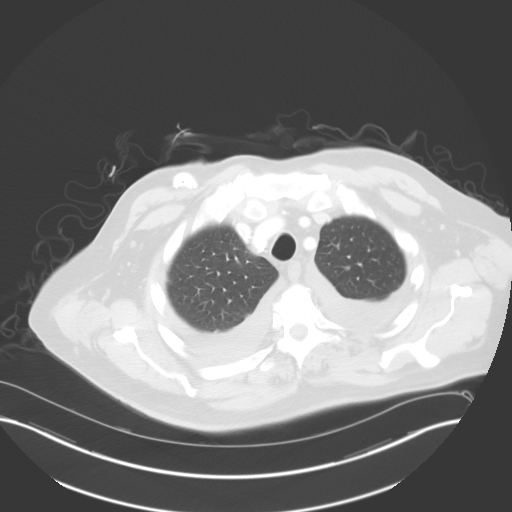

[Series 4: coronals · coronal · 0.82mm/px · 3 of 171 slices shown]
[im 35/171  lung]
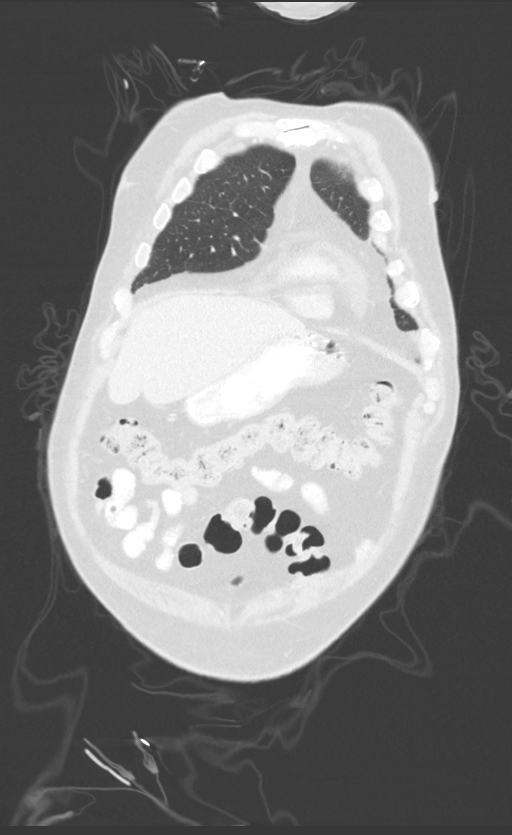
[im 69/171  lung]
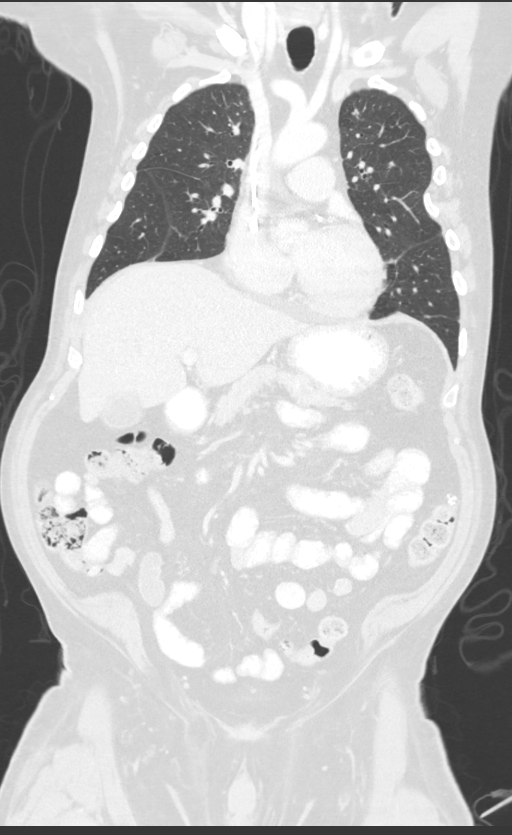
[im 103/171  lung]
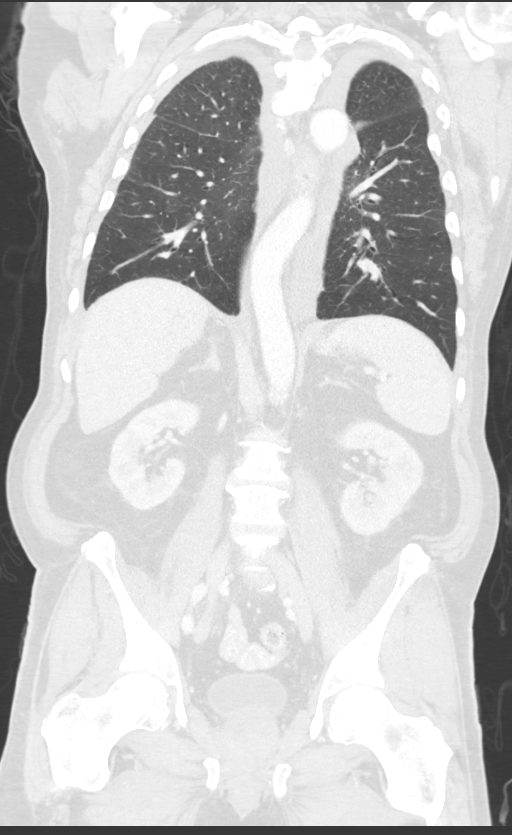

[12 of 36 positions shown; findings below may reference images not displayed]

FINDINGS: CT CHEST FINDINGS

Cardiovascular: Right chest port catheter. Aortic atherosclerosis.
Normal heart size. Three-vessel coronary artery calcifications. New
small pericardial effusion.

Mediastinum/Nodes: No enlarged mediastinal, hilar, or axillary lymph
nodes. Thyroid gland, trachea, and esophagus demonstrate no
significant findings.

Lungs/Pleura: There are new, moderate bilateral pleural effusions
and associated atelectasis or consolidation. There are multiple
small pulmonary nodules, which are unchanged compared to prior
examination, the largest a 7 mm nodule of the right lower lobe.

Musculoskeletal: No chest wall mass or suspicious bone lesions
identified.

CT ABDOMEN PELVIS FINDINGS

Hepatobiliary: Stable or minimally decreased size of a very
ill-defined, hypodense and somewhat retractile appearing mass of the
central right lobe of the liver abutting the inferior vena cava and
right portal vein, measuring approximately 5.7 x 5.0 cm, previously
6.2 x 5.2 cm when measured similarly (series 2, image 54). There is
no significant change in somewhat geographic hypodensity of the
right lobe of the liver (series 2, image 56). Small hypodense
nodules of the right lobe identified on prior examination are poorly
appreciated on this single phase contrast examination although not
grossly changed (series 2, image 58). Small gallstone near the
gallbladder neck and calcific debris in the dependent gallbladder.
Mild gallbladder wall thickening similar to prior examination. No
biliary ductal dilatation.

Pancreas: Unremarkable. No pancreatic ductal dilatation or
surrounding inflammatory changes.

Spleen: Normal in size without significant abnormality.

Adrenals/Urinary Tract: Adrenal glands are unremarkable. Kidneys are
normal, without renal calculi, solid lesion, or hydronephrosis.
Bladder is unremarkable.

Stomach/Bowel: Stomach is within normal limits. Appendix may be
diminutive or surgically absent. No evidence of bowel wall
thickening, distention, or inflammatory changes.

Vascular/Lymphatic: Aortic atherosclerosis. No enlarged abdominal or
pelvic lymph nodes.

Reproductive: Mild prostatomegaly.

Other: No abdominal wall hernia or abnormality. No abdominopelvic
ascites.

Musculoskeletal: No acute or significant osseous findings.
IMPRESSION: 1. Stable or minimally decreased size of a very ill-defined,
hypodense and somewhat retractile appearing mass of the central
right lobe of the liver abutting the inferior vena cava and right
portal vein, measuring approximately 5.7 x 5.0 cm, previously 6.2 x
5.2 cm when measured similarly. Findings are consistent with stable
or minimally improved cholangiocarcinoma.
2. Small hypodense nodules of the right lobe identified on prior
examination are poorly appreciated on this single phase contrast
examination although not grossly changed. Attention on follow-up.
3. There are new, moderate bilateral pleural effusions and
associated atelectasis or consolidation as well as a new small
pericardial effusion, nonspecific although generally concerning and
suspicious for malignant effusions. There is no directly visualized
pleural nodularity.
4. Multiple small pulmonary nodules are stable.  No new nodules.
5. Coronary artery disease. Aortic Atherosclerosis ([LZ]-[LZ]).

## 2020-04-08 IMAGING — CT CT CHEST W/ CM
2 of 5 series · 12 of 36 positions shown, 15 images · IV contrast (omnipaque)
Comparison: [DATE], [DATE]

CLINICAL DATA: Cholangiocarcinoma restaging

EXAM:
CT CHEST, ABDOMEN, AND PELVIS WITH CONTRAST
TECHNIQUE: Multidetector CT imaging of the chest, abdomen and pelvis was
performed following the standard protocol during bolus
administration of intravenous contrast.
CONTRAST:  100mL OMNIPAQUE IOHEXOL 300 MG/ML SOLN, additional oral
enteric contrast

[Series 2: cap with · axial · 0.88mm/px · z∈[-628,-88]mm · 9 of 136 slices shown, 12 images]
[im 14/136  mediastinal]
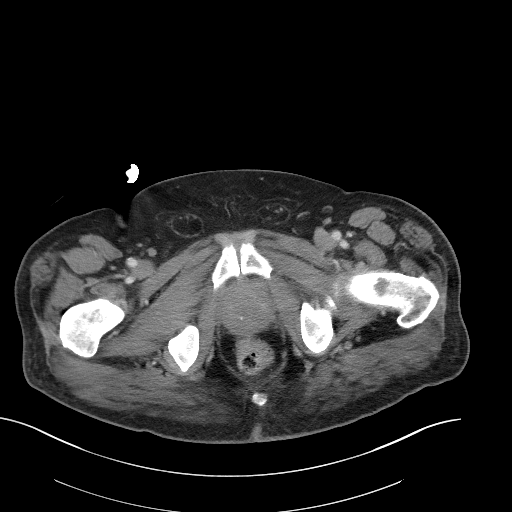
[im 14/136  lung]
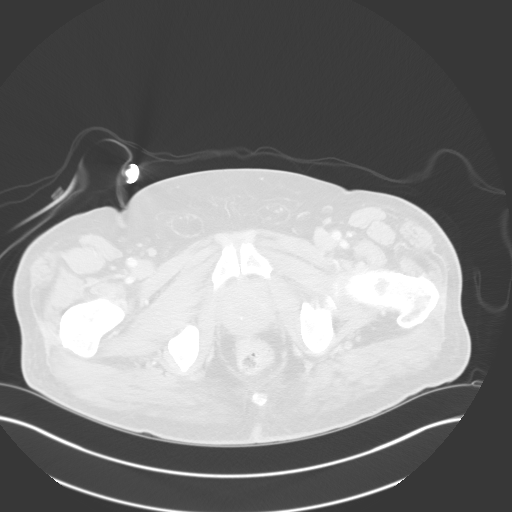
[im 28/136  lung]
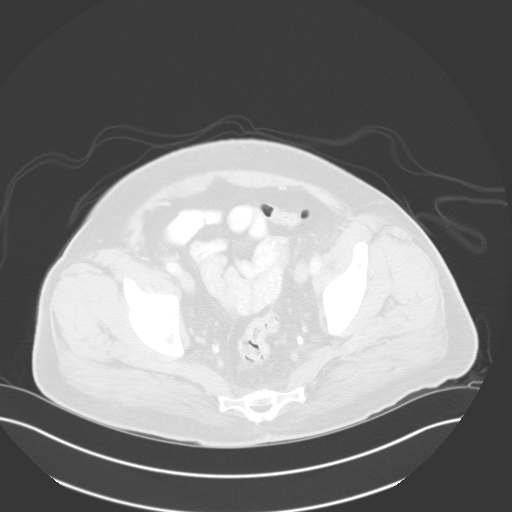
[im 41/136  lung]
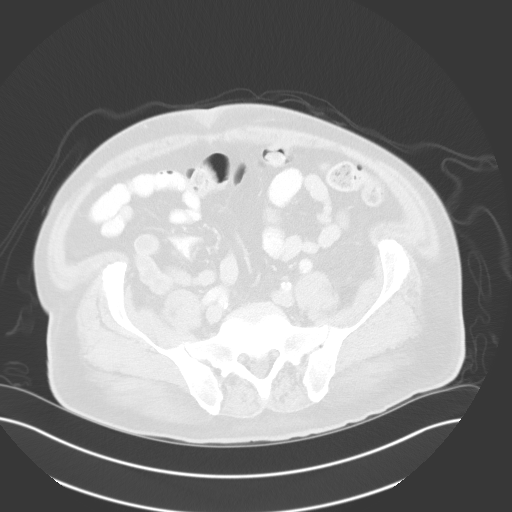
[im 55/136  lung]
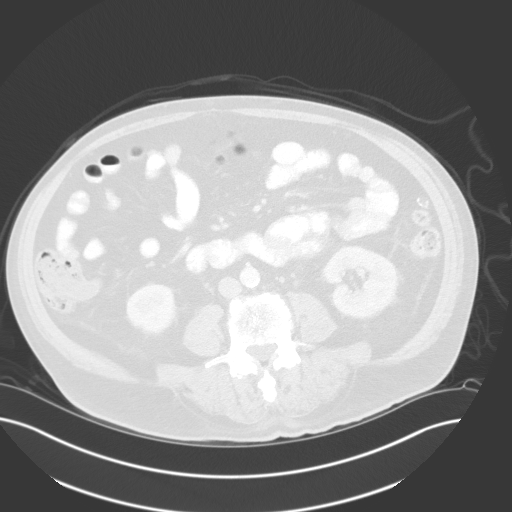
[im 68/136  mediastinal]
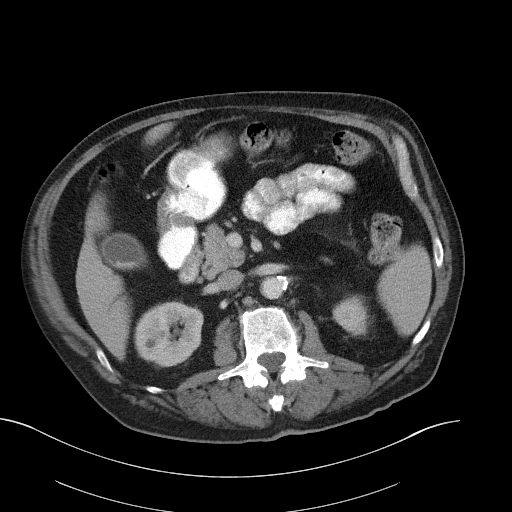
[im 68/136  lung]
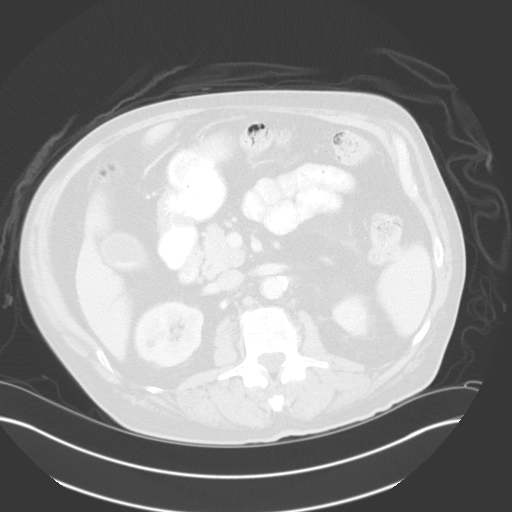
[im 82/136  lung]
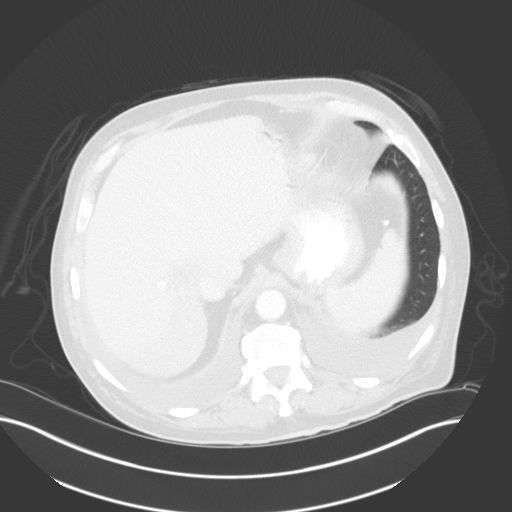
[im 95/136  lung]
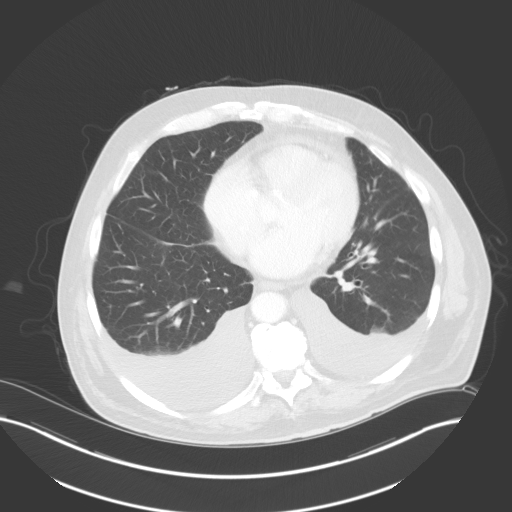
[im 109/136  lung]
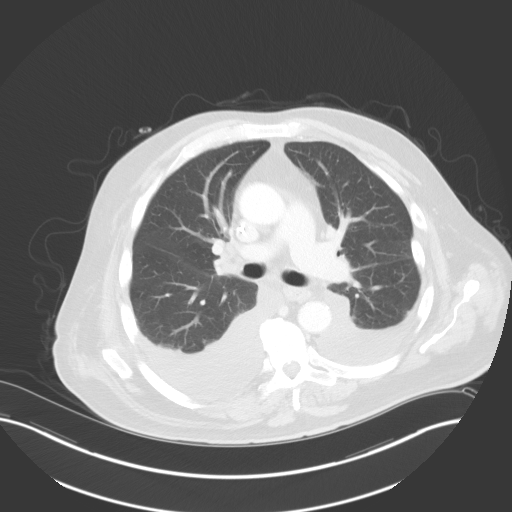
[im 122/136  mediastinal]
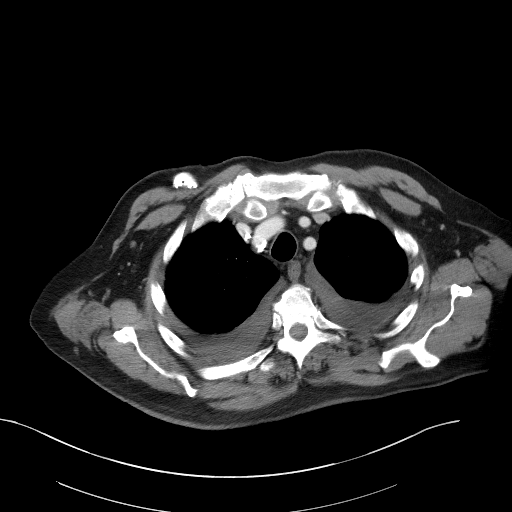
[im 122/136  lung]
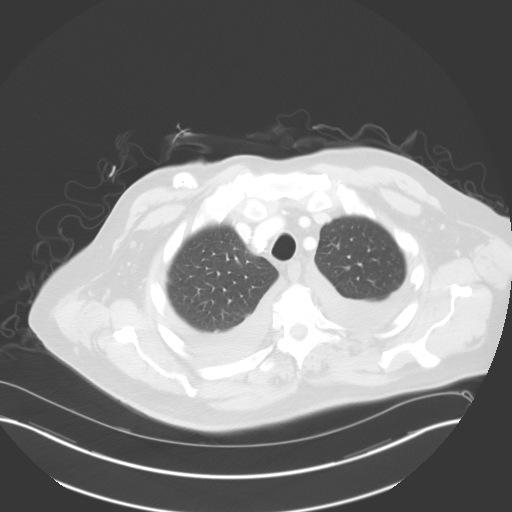

[Series 4: coronals · coronal · 0.82mm/px · 3 of 171 slices shown]
[im 35/171  lung]
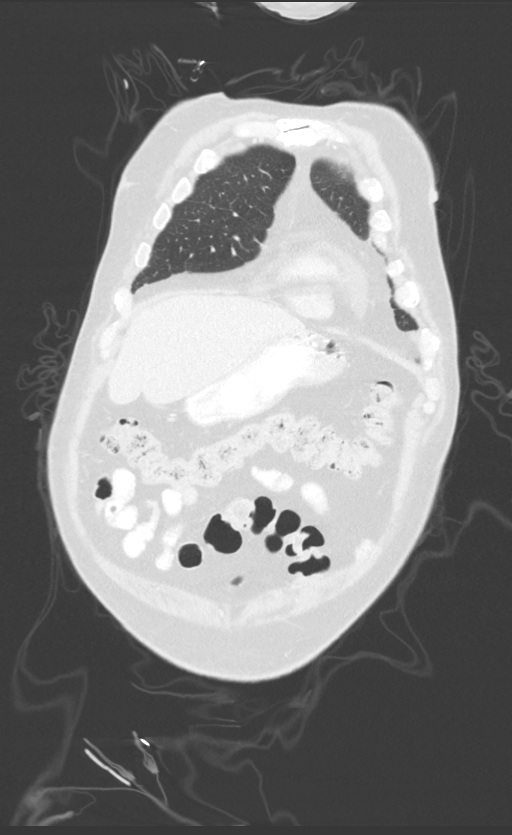
[im 69/171  lung]
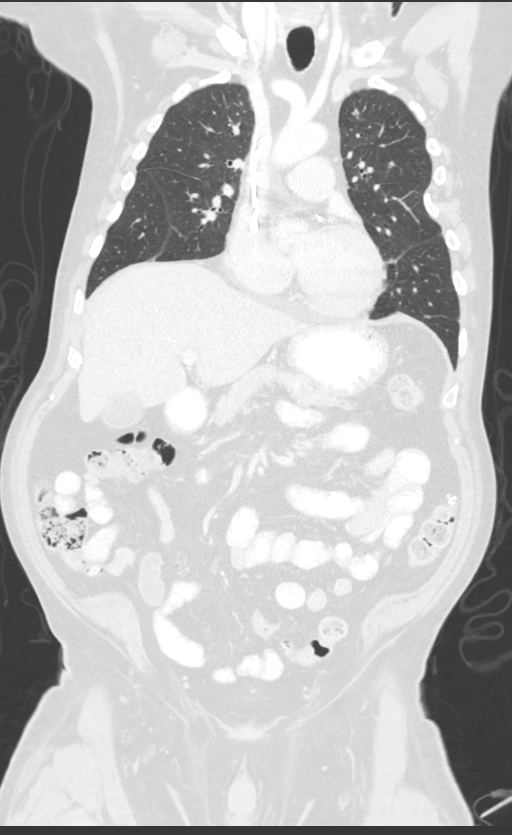
[im 103/171  lung]
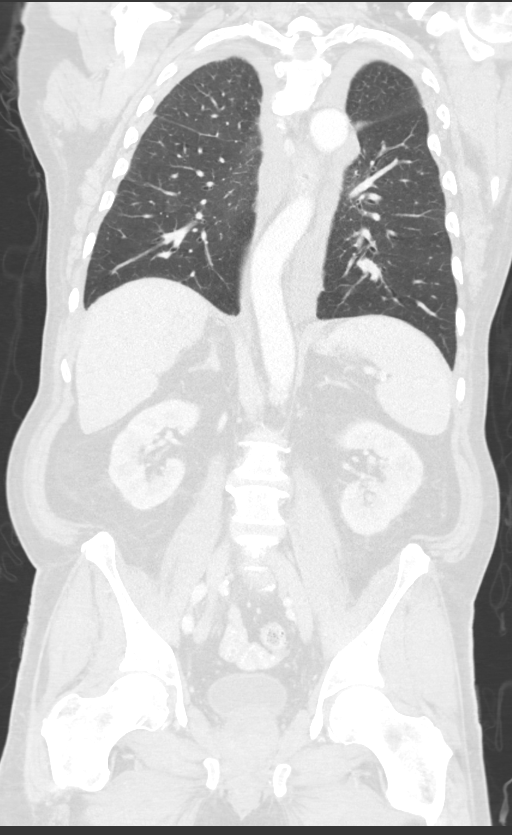

[12 of 36 positions shown; findings below may reference images not displayed]

FINDINGS: CT CHEST FINDINGS

Cardiovascular: Right chest port catheter. Aortic atherosclerosis.
Normal heart size. Three-vessel coronary artery calcifications. New
small pericardial effusion.

Mediastinum/Nodes: No enlarged mediastinal, hilar, or axillary lymph
nodes. Thyroid gland, trachea, and esophagus demonstrate no
significant findings.

Lungs/Pleura: There are new, moderate bilateral pleural effusions
and associated atelectasis or consolidation. There are multiple
small pulmonary nodules, which are unchanged compared to prior
examination, the largest a 7 mm nodule of the right lower lobe.

Musculoskeletal: No chest wall mass or suspicious bone lesions
identified.

CT ABDOMEN PELVIS FINDINGS

Hepatobiliary: Stable or minimally decreased size of a very
ill-defined, hypodense and somewhat retractile appearing mass of the
central right lobe of the liver abutting the inferior vena cava and
right portal vein, measuring approximately 5.7 x 5.0 cm, previously
6.2 x 5.2 cm when measured similarly (series 2, image 54). There is
no significant change in somewhat geographic hypodensity of the
right lobe of the liver (series 2, image 56). Small hypodense
nodules of the right lobe identified on prior examination are poorly
appreciated on this single phase contrast examination although not
grossly changed (series 2, image 58). Small gallstone near the
gallbladder neck and calcific debris in the dependent gallbladder.
Mild gallbladder wall thickening similar to prior examination. No
biliary ductal dilatation.

Pancreas: Unremarkable. No pancreatic ductal dilatation or
surrounding inflammatory changes.

Spleen: Normal in size without significant abnormality.

Adrenals/Urinary Tract: Adrenal glands are unremarkable. Kidneys are
normal, without renal calculi, solid lesion, or hydronephrosis.
Bladder is unremarkable.

Stomach/Bowel: Stomach is within normal limits. Appendix may be
diminutive or surgically absent. No evidence of bowel wall
thickening, distention, or inflammatory changes.

Vascular/Lymphatic: Aortic atherosclerosis. No enlarged abdominal or
pelvic lymph nodes.

Reproductive: Mild prostatomegaly.

Other: No abdominal wall hernia or abnormality. No abdominopelvic
ascites.

Musculoskeletal: No acute or significant osseous findings.
IMPRESSION: 1. Stable or minimally decreased size of a very ill-defined,
hypodense and somewhat retractile appearing mass of the central
right lobe of the liver abutting the inferior vena cava and right
portal vein, measuring approximately 5.7 x 5.0 cm, previously 6.2 x
5.2 cm when measured similarly. Findings are consistent with stable
or minimally improved cholangiocarcinoma.
2. Small hypodense nodules of the right lobe identified on prior
examination are poorly appreciated on this single phase contrast
examination although not grossly changed. Attention on follow-up.
3. There are new, moderate bilateral pleural effusions and
associated atelectasis or consolidation as well as a new small
pericardial effusion, nonspecific although generally concerning and
suspicious for malignant effusions. There is no directly visualized
pleural nodularity.
4. Multiple small pulmonary nodules are stable.  No new nodules.
5. Coronary artery disease. Aortic Atherosclerosis ([LZ]-[LZ]).

## 2020-04-08 MED ORDER — HEPARIN SOD (PORK) LOCK FLUSH 100 UNIT/ML IV SOLN
500.0000 [IU] | Freq: Once | INTRAVENOUS | Status: AC
  Administered 2020-04-08: 500 [IU] via INTRAVENOUS

## 2020-04-08 MED ORDER — HEPARIN SOD (PORK) LOCK FLUSH 100 UNIT/ML IV SOLN
INTRAVENOUS | Status: AC
  Filled 2020-04-08: qty 5

## 2020-04-08 MED ORDER — IOHEXOL 300 MG/ML  SOLN
100.0000 mL | Freq: Once | INTRAMUSCULAR | Status: AC | PRN
  Administered 2020-04-08: 100 mL via INTRAVENOUS

## 2020-04-08 MED ORDER — SODIUM CHLORIDE (PF) 0.9 % IJ SOLN
INTRAMUSCULAR | Status: AC
  Filled 2020-04-08: qty 50

## 2020-04-09 NOTE — H&P (View-Only) (Signed)
Brandon Sherman   [phone] [phone]   Clinic Follow up Note   Patient Care Team: [name], PA as PCP - General (Family Medicine) [name], MD as Consulting Physician (General Surgery) [name], [name], MD as Consulting Physician (Gastroenterology) [name], MD as Consulting Physician (Hematology)  Date of Service:  04/12/2020  CHIEF COMPLAINT: F/u cholangiocarcinoma   SUMMARY OF ONCOLOGIC HISTORY: Oncology History Overview Note  Cancer Staging Intrahepatic cholangiocarcinoma ([place]) Staging form: Intrahepatic Bile Duct, AJCC 8th Edition - Clinical stage from 08/19/2019: Stage II (cT2, cN0, cM0) - Signed by [name], MD on 08/19/2019    Intrahepatic cholangiocarcinoma ([place])  06/28/2019 Imaging   CT AP W Contrast 06/28/19  IMPRESSION: 1. Heterogeneous hypodensity posteriorly in the right hepatic lobe and potentially extending into the caudate lobe suspicious for a mass. There is felt to be truncation of branches of the portal vein in this vicinity and some narrowing of the hepatic vein, as well as triangular-shaped regions of abnormal hypoenhancement posteriorly in the right hepatic lobe likely representing downstream vascular effects. Cannot exclude malignancy such as cholangiocarcinoma or hepatocellular carcinoma, and follow up hepatic protocol MRI with and without contrast is recommended to further characterize. 2. 4 mm right middle lobe pulmonary nodule is likely benign but may merit surveillance. 3. Cholelithiasis. 4.  Aortic Atherosclerosis (ICD10-I70.0). 5. Prostatomegaly. 6. Mild impingement at L3-4 and L4-5.   07/31/2019 Imaging   MRI Liver 07/31/19 IMPRESSION: 1. 7.3 cm in long axis mass in the right hepatic lobe spanning into the caudate lobe, high suspicion for malignancy such as hepatocellular carcinoma or cholangiocarcinoma. Suspected effacement or occlusion of the right hepatic vein and posterior branches of the  right portal vein. Two smaller tumor nodules along the posterior periphery of the dominant mass. Tissue diagnosis is recommended. 2. No findings of pathologic adenopathy or distant metastatic spread. 3. 9 mm gallstone in the gallbladder. There is mild gallbladder wall thickening which may be from nondistention, correlate clinically in assessing for cholecystitis. 4.  Aortic Atherosclerosis (ICD10-I70.0). 5. Mild diffuse hepatic steatosis.   08/11/2019 Initial Biopsy   DIAGNOSIS: 08/11/19  A. LIVER, RIGHT, BIOPSY:  - Adenocarcinoma.   08/18/2019 Imaging   CT Chest 08/18/19  IMPRESSION: 1. Multiple pulmonary nodules largest at approximately 7 mm in the right lower lobe, nonspecific but concerning given findings in the liver. 2. No signs of definitive metastatic disease, also with mildly enlarged upper abdominal lymph nodes as discussed.   Aortic Atherosclerosis (ICD10-I70.0).   08/19/2019 Initial Diagnosis   Intrahepatic cholangiocarcinoma ([place])   08/19/2019 Cancer Staging   Staging form: Intrahepatic Bile Duct, AJCC 8th Edition - Clinical stage from 08/19/2019: Stage II (cT2, cN0, cM0) - Signed by [name], MD on 08/19/2019   09/29/2019 -  Chemotherapy   Cisplatin and Gemcitabine 2 weeks on/1 week off starting 09/29/19. Cisplatin held from cycle 9 (03/23/20) due to fluid status/Afib. He is now on maintenance Gemcitabine.     10/09/2019 Imaging   CT AP IMPRESSION: 1. The dominant right hepatic lobe mass is minimally reduced in size compared to prior exams, currently measuring 6.2 by 5.3 cm, previously 6.2 by 5.6 cm. However, there is a new small hypodense lesion centrally in the right hepatic lobe which is suspicious for a new small focus of tumor. Accordingly this is an overall mixed appearance. 2. Continued hypoenhancement in the liver downstream of the tumor likely attributable to narrowing or occlusion of the right hepatic vein by the tumor. By virtue  of its location the tumor wraps  around the intrahepatic portion of the IVC. 3. 4 mm right middle lobe pulmonary nodule, stable compared to earliest available comparison of 07/18/2019. Surveillance of the patient's pulmonary nodules is recommended. 4. Other imaging findings of potential clinical significance: Coronary atherosclerosis. Cholelithiasis. Prominent stool throughout the colon favors constipation. Moderate prostatomegaly with heterogeneous enhancement of the prostate gland. Lumbar spondylosis and degenerative disc disease causing mild bilateral foraminal impingement at L3-4 and L4-5.   Aortic Atherosclerosis (ICD10-I70.0).   12/24/2019 Imaging   CT CAP W Contrast  IMPRESSION: 1. Right hepatic lobe mass and adjacent right hepatic lobe nodules appear grossly stable. No evidence of distant metastatic disease. 2. Continued stability of small pulmonary nodules. Recommend attention on follow-up. 3. Cholelithiasis. 4. Enlarged prostate. 5. Aortic atherosclerosis (ICD10-I70.0). Coronary artery calcification.   04/08/2020 Imaging   CT CAP  IMPRESSION: 1. Stable or minimally decreased size of a very ill-defined, hypodense and somewhat retractile appearing mass of the central right lobe of the liver abutting the inferior vena cava and right portal vein, measuring approximately 5.7 x 5.0 cm, previously 6.2 x 5.2 cm when measured similarly. Findings are consistent with stable or minimally improved cholangiocarcinoma. 2. Small hypodense nodules of the right lobe identified on prior examination are poorly appreciated on this single phase contrast examination although not grossly changed. Attention on follow-up. 3. There are new, moderate bilateral pleural effusions and associated atelectasis or consolidation as well as a new small pericardial effusion, nonspecific although generally concerning and suspicious for malignant effusions. There is no directly visualized pleural nodularity. 4. Multiple small pulmonary nodules  are stable.  No new nodules. 5. Coronary artery disease. Aortic Atherosclerosis (ICD10-I70.0).      CURRENT THERAPY:  Cisplatin and Gemcitabine 2 weeks on/1 week off starting 09/29/19. Cisplatin held from cycle 9 (03/23/20) due to fluid status/Afib. He is now on maintenance Gemcitabine.   INTERVAL HISTORY:  Brandon Sherman is here for a follow up and treatment. He presents to the clinic with his wife. He notes single agent chemo is tolerable but does not feel much difference except time of infusion. He notes fatigue only when he goes to walk. He can walk for a moderate distance because of his legs feeling week and SOB.    REVIEW OF SYSTEMS:   Constitutional: Denies fevers, chills or abnormal weight loss (+) Fatigue  Eyes: Denies blurriness of vision Ears, nose, mouth, throat, and face: Denies mucositis or sore throat Respiratory: Denies cough or wheezes (+) SOB  Cardiovascular: Denies palpitation, chest discomfort or lower extremity swelling Gastrointestinal:  Denies nausea, heartburn or change in bowel habits Skin: Denies abnormal skin rashes Lymphatics: Denies new lymphadenopathy or easy bruising Neurological:Denies numbness, tingling or new weaknesses (+) Leg weakness  Behavioral/Psych: Mood is stable, no new changes  All other systems were reviewed with the patient and are negative.  MEDICAL HISTORY:  Past Medical History:  Diagnosis Date  . Arthritis   . Diabetes ([place])   . GERD (gastroesophageal reflux disease)   . Hyperlipidemia   . Hypertension   . Intrahepatic cholangiocarcinoma ([employer])     SURGICAL HISTORY: Past Surgical History:  Procedure Laterality Date  . APPENDECTOMY  1980  . COLONOSCOPY    . IR IMAGING GUIDED PORT INSERTION  02/23/2020    I have reviewed the social history and family history with the patient and they are unchanged from previous note.  ALLERGIES:  has No Known Allergies.  MEDICATIONS:  Current Outpatient Medications  Medication  Sig Dispense  Refill  . allopurinol (ZYLOPRIM) 100 MG tablet Take 100 mg by mouth daily.    Marland Kitchen amLODipine (NORVASC) 2.5 MG tablet Take 2.5 mg by mouth daily.    Marland Kitchen apixaban (ELIQUIS) 5 MG TABS tablet Take 1 tablet (5 mg total) by mouth 2 (two) times daily. 180 tablet 3  . fenofibrate (TRICOR) 145 MG tablet Take 145 mg by mouth daily.    . ferrous sulfate 325 (65 FE) MG tablet Take 1 tablet (325 mg total) by mouth daily. 30 tablet 0  . finasteride (PROSCAR) 5 MG tablet Take 5 mg by mouth daily.    . furosemide (LASIX) 20 MG tablet Take 1 tablet (20 mg total) by mouth every other day. 45 tablet 3  . lidocaine-prilocaine (EMLA) cream Apply 1 application topically as needed. 30 g 3  . lisinopril (ZESTRIL) 40 MG tablet Take 40 mg by mouth daily.    . magnesium oxide (MAG-OX) 400 MG tablet TAKE 1 TABLET BY MOUTH EVERY DAY 30 tablet 0  . metFORMIN (GLUCOPHAGE) 500 MG tablet Take 500 mg by mouth 2 (two) times daily with a meal.     . metoprolol succinate (TOPROL-XL) 25 MG 24 hr tablet TAKE 1 TABLET BY MOUTH EVERY DAY 30 tablet 0  . ondansetron (ZOFRAN) 8 MG tablet Take 1 tablet (8 mg total) by mouth 2 (two) times daily as needed. Start on the third day after chemotherapy. 30 tablet 1  . pantoprazole (PROTONIX) 40 MG tablet TAKE 1 TABLET BY MOUTH TWICE A DAY 180 tablet 0  . pravastatin (PRAVACHOL) 40 MG tablet Take 40 mg by mouth daily.    . prochlorperazine (COMPAZINE) 10 MG tablet Take 1 tablet (10 mg total) by mouth every 6 (six) hours as needed (Nausea or vomiting). 30 tablet 1  . sucralfate (CARAFATE) 1 g tablet TAKE 1 TABLET BY MOUTH EVERY 6 HOURS AS NEEDED. 180 tablet 0  . tamsulosin (FLOMAX) 0.4 MG CAPS capsule Take 0.4 mg by mouth daily.    . Vitamin D, Ergocalciferol, (DRISDOL) 1.25 MG (50000 UT) CAPS capsule Take 50,000 Units by mouth every 7 (seven) days.     No current facility-administered medications for this visit.   Facility-Administered Medications Ordered in Other Visits  Medication Dose Route  Frequency Provider Last Rate Last Admin  . prochlorperazine (COMPAZINE) tablet 10 mg  10 mg Oral Q6H PRN [name], MD   10 mg at 04/12/20 0936  . sodium chloride flush (NS) 0.9 % injection 10 mL  10 mL Intracatheter PRN [name], MD   10 mL at 04/12/20 1041    PHYSICAL EXAMINATION: ECOG PERFORMANCE STATUS: 2 - Symptomatic, <50% confined to bed  Vitals:   04/12/20 0852  BP: (!) 167/98  Pulse: 87  Resp: 18  Temp: (!) 97.5 F (36.4 C)  SpO2: 100%   Filed Weights   04/12/20 0852  Weight: 220 lb 9.6 oz (100.1 kg)    GENERAL:alert, no distress and comfortable SKIN: skin color, texture, turgor are normal, no rashes or significant lesions EYES: normal, Conjunctiva are pink and non-injected, sclera clear  NECK: supple, thyroid normal size, non-tender, without nodularity LYMPH:  no palpable lymphadenopathy in the cervical, axillary  LUNGS: clear to auscultation and percussion with normal breathing effort (+) Mild fluid collection at base of lungs.  HEART: regular rate & rhythm and no murmurs and no lower extremity edema ABDOMEN:abdomen soft, non-tender and normal bowel sounds Musculoskeletal:no cyanosis of digits and no clubbing  NEURO: alert &  oriented x 3 with fluent speech, no focal motor/sensory deficits  LABORATORY DATA:  I have reviewed the data as listed CBC Latest Ref Rng & Units 04/12/2020 03/29/2020 03/23/2020  WBC 4.0 - 10.5 K/uL 17.5(H) 2.1(L) 4.5  Hemoglobin 13.0 - 17.0 g/dL 9.5(L) 9.9(L) 10.4(L)  Hematocrit 39.0 - 52.0 % 29.6(L) 30.3(L) 32.2(L)  Platelets 150 - 400 K/uL 205 157 173     CMP Latest Ref Rng & Units 04/12/2020 03/29/2020 03/24/2020  Glucose 70 - 99 mg/dL 93 94 -  BUN 8 - 23 mg/dL 18 14 -  Creatinine 0.61 - 1.24 mg/dL 1.20 1.16 -  Sodium 135 - 145 mmol/L 143 140 -  Potassium 3.5 - 5.1 mmol/L 3.6 3.7 -  Chloride 98 - 111 mmol/L 107 107 -  CO2 22 - 32 mmol/L 25 22 -  Calcium 8.9 - 10.3 mg/dL 9.4 9.3 -  Total Protein 6.5 - 8.1 g/dL 6.4(L) 6.4(L) 6.1  Total  Bilirubin 0.3 - 1.2 mg/dL 0.6 0.6 1.0  Alkaline Phos 38 - 126 U/L 81 64 56  AST 15 - 41 U/L 16 37 19  ALT 0 - 44 U/L _0 RADIOGRAPHIC STUDIES: I have personally reviewed the radiological images as listed and agreed with the findings in the report. No results found.   ASSESSMENT & PLAN:  KEYVON HERTER is a 74 y.o. male with   1.Intrahepatic cholangiocarcinoma, cT2N0Mx,unresectable,with indeterminate lung nodules -He was diagnosed in 07/2019.CT scans and MRI liver showa large7.3cm mass in the right hepatic lobe whichabutsportal vein.  -He was seen byour local surgeon Dr. Barry Dienes andDr Carlis Abbott at [facility] concluded that cancer is not resectable due to the invasion to portal vein. -I discussed given his cancer is non-resectable his cancer is likely not curable but still treatable. Istarted him onstandardfirst line chemo with IV Cisplatin and Gemcitabine 2 weeks on/1 weekoff beginning 09/29/19.Cisplatin was held since C9 (03/23/20) due to fluid status/AFib. He is now on maintenance Gemcitabine alone.  -His FOresults showed MSI stable disease, IDH 1 mutation,so he may benefit from IDH inhibitor in the future. -He had PAC placed on 02/23/20.  -I personally reviewed and discussed her CAP from 04/08/20 which shows stable disease in the liver. Scan also showed b/l pleural effusion related to his AF. Will continue current maintenance therapy. He is tolerating moderately well with fatigue.  -Labs reviewed, WBC 17.5, hg 9.5, ANC 13.6, protein 6.4.  His leukocytosis is related to G-CSF after last cycle chemo, overall adequate to proceed with C10D1 Gemcitabine today. Will hold Fulphila this cycle as he may not need on single agent chemo.  -F/u in 3 weeks   2. Nausea, Low food intakeand weight loss, Epigastric pain  -With pain from GERD and nausea he initially lost 30 pounds. -His nausea and GERD is based on what he eats. Controlled onPhenergan, Sucralfate and Pepcid.  -Hewaspreviouslyseen by Dietician. -No current nausea or vomiting on chemo and using less antiemetics.For his mild constipation he can continue OTC stool softeners.His weight is stable as he is eating adequately. -He has no more nausea and able to gain weight. -He notes intermittent epigastric pain (S/p C7). This improves with Sucralfate. He will also continue antiacid.    3. DM, HTN, HLD, Gout, LE edema -On Metformin, amlodipine, lisinopril., allopurinol. Continue to f/u with his PCP  -He does have LE edema which he attributes to Gout. I previously added low dose IV lasix to infusions given crackling of lungs on 01/19/20 exam.  4. New onset AF -With PAC placement on 02/23/20 he was seen to have Afib. Based on CHADS2 Score 2. He is at moderate risk for stroke.  -He has Afib and mild tachycardia on 03/01/20 exam. He is now being seen by Cardiologist, on Eliquis, Lasix, Toprolol.  -He is scheduled to have a Cardioversion with Dr. Meda Coffee on 04/27/20.  -His CT scan from 04/08/20 shows b/l pleural effusion.  -He notes leg weakness and SOB. I recommend he use recliner or lay with head elevated and to do some exercise to help strengthen his legs.   5. Nicotine Use -He never smoked but has been chewing Tobacco for the past 60 years.He no longer drinks alcohol. -He now chews 1-2 times a day.Iagainencouraged him toreduce and quit completely.  6.GERDand gastritis, history ofesophageal candidiasis -Hehad repeated EGDwith Dr. Havery Moros in 07/2019. 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  -Will monitor andmayrepeat EGDin future   PLAN: -CT CAP reviewed, Stable disease -Labs reviewed and adequate to proceed with C10D1 Gemcitabine today, no Fulphila, will also cancel his lasix with chemo -Lab, flush andchemo Gemcitabine in 1, 3, 4 weeks  -F/u in 3 weeks   No problem-specific  Assessment & Plan notes found for this encounter.   No orders of the defined types were placed in this encounter.  All questions were answered. The patient knows to call the clinic with any problems, questions or concerns. No barriers to learning was detected. The total time spent in the appointment was 30 minutes.     [name], MD 04/12/2020   I, Joslyn Devon, am acting as scribe for [name], MD.   I have reviewed the above documentation for accuracy and completeness, and I agree with the above.

## 2020-04-09 NOTE — Progress Notes (Signed)
[facility]   [phone] [phone]   Clinic Follow up Note   Patient Care Team: Yates, Kate H, PA as PCP - General (Family Medicine) Byerly, Faera, MD as Consulting Physician (General Surgery) [name], Steven P, MD as Consulting Physician (Gastroenterology) Jelina Paulsen, MD as Consulting Physician (Hematology)  Date of Service:  04/12/2020  CHIEF COMPLAINT: F/u cholangiocarcinoma   SUMMARY OF ONCOLOGIC HISTORY: Oncology History Overview Note  Cancer Staging Intrahepatic cholangiocarcinoma (HCC) Staging form: Intrahepatic Bile Duct, AJCC 8th Edition - Clinical stage from 08/19/2019: Stage II (cT2, cN0, cM0) - Signed by Janera Peugh, MD on 08/19/2019    Intrahepatic cholangiocarcinoma (HCC)  06/28/2019 Imaging   CT AP W Contrast 06/28/19  IMPRESSION: 1. Heterogeneous hypodensity posteriorly in the right hepatic lobe and potentially extending into the caudate lobe suspicious for a mass. There is felt to be truncation of branches of the portal vein in this vicinity and some narrowing of the hepatic vein, as well as triangular-shaped regions of abnormal hypoenhancement posteriorly in the right hepatic lobe likely representing downstream vascular effects. Cannot exclude malignancy such as cholangiocarcinoma or hepatocellular carcinoma, and follow up hepatic protocol MRI with and without contrast is recommended to further characterize. 2. 4 mm right middle lobe pulmonary nodule is likely benign but may merit surveillance. 3. Cholelithiasis. 4.  Aortic Atherosclerosis (ICD10-I70.0). 5. Prostatomegaly. 6. Mild impingement at L3-4 and L4-5.   07/31/2019 Imaging   MRI Liver 07/31/19 IMPRESSION: 1. 7.3 cm in long axis mass in the right hepatic lobe spanning into the caudate lobe, high suspicion for malignancy such as hepatocellular carcinoma or cholangiocarcinoma. Suspected effacement or occlusion of the right hepatic vein and posterior branches of the  right portal vein. Two smaller tumor nodules along the posterior periphery of the dominant mass. Tissue diagnosis is recommended. 2. No findings of pathologic adenopathy or distant metastatic spread. 3. 9 mm gallstone in the gallbladder. There is mild gallbladder wall thickening which may be from nondistention, correlate clinically in assessing for cholecystitis. 4.  Aortic Atherosclerosis (ICD10-I70.0). 5. Mild diffuse hepatic steatosis.   08/11/2019 Initial Biopsy   DIAGNOSIS: 08/11/19  A. LIVER, RIGHT, BIOPSY:  - Adenocarcinoma.   08/18/2019 Imaging   CT Chest 08/18/19  IMPRESSION: 1. Multiple pulmonary nodules largest at approximately 7 mm in the right lower lobe, nonspecific but concerning given findings in the liver. 2. No signs of definitive metastatic disease, also with mildly enlarged upper abdominal lymph nodes as discussed.   Aortic Atherosclerosis (ICD10-I70.0).   08/19/2019 Initial Diagnosis   Intrahepatic cholangiocarcinoma (HCC)   08/19/2019 Cancer Staging   Staging form: Intrahepatic Bile Duct, AJCC 8th Edition - Clinical stage from 08/19/2019: Stage II (cT2, cN0, cM0) - Signed by Danilynn Jemison, MD on 08/19/2019   09/29/2019 -  Chemotherapy   Cisplatin and Gemcitabine 2 weeks on/1 week off starting 09/29/19. Cisplatin held from cycle 9 (03/23/20) due to fluid status/Afib. He is now on maintenance Gemcitabine.     10/09/2019 Imaging   CT AP IMPRESSION: 1. The dominant right hepatic lobe mass is minimally reduced in size compared to prior exams, currently measuring 6.2 by 5.3 cm, previously 6.2 by 5.6 cm. However, there is a new small hypodense lesion centrally in the right hepatic lobe which is suspicious for a new small focus of tumor. Accordingly this is an overall mixed appearance. 2. Continued hypoenhancement in the liver downstream of the tumor likely attributable to narrowing or occlusion of the right hepatic vein by the tumor. By virtue   of its location the tumor wraps  around the intrahepatic portion of the IVC. 3. 4 mm right middle lobe pulmonary nodule, stable compared to earliest available comparison of 07/18/2019. Surveillance of the patient's pulmonary nodules is recommended. 4. Other imaging findings of potential clinical significance: Coronary atherosclerosis. Cholelithiasis. Prominent stool throughout the colon favors constipation. Moderate prostatomegaly with heterogeneous enhancement of the prostate gland. Lumbar spondylosis and degenerative disc disease causing mild bilateral foraminal impingement at L3-4 and L4-5.   Aortic Atherosclerosis (ICD10-I70.0).   12/24/2019 Imaging   CT CAP W Contrast  IMPRESSION: 1. Right hepatic lobe mass and adjacent right hepatic lobe nodules appear grossly stable. No evidence of distant metastatic disease. 2. Continued stability of small pulmonary nodules. Recommend attention on follow-up. 3. Cholelithiasis. 4. Enlarged prostate. 5. Aortic atherosclerosis (ICD10-I70.0). Coronary artery calcification.   04/08/2020 Imaging   CT CAP  IMPRESSION: 1. Stable or minimally decreased size of a very ill-defined, hypodense and somewhat retractile appearing mass of the central right lobe of the liver abutting the inferior vena cava and right portal vein, measuring approximately 5.7 x 5.0 cm, previously 6.2 x 5.2 cm when measured similarly. Findings are consistent with stable or minimally improved cholangiocarcinoma. 2. Small hypodense nodules of the right lobe identified on prior examination are poorly appreciated on this single phase contrast examination although not grossly changed. Attention on follow-up. 3. There are new, moderate bilateral pleural effusions and associated atelectasis or consolidation as well as a new small pericardial effusion, nonspecific although generally concerning and suspicious for malignant effusions. There is no directly visualized pleural nodularity. 4. Multiple small pulmonary nodules  are stable.  No new nodules. 5. Coronary artery disease. Aortic Atherosclerosis (ICD10-I70.0).      CURRENT THERAPY:  Cisplatin and Gemcitabine 2 weeks on/1 week off starting 09/29/19. Cisplatin held from cycle 9 (03/23/20) due to fluid status/Afib. He is now on maintenance Gemcitabine.   INTERVAL HISTORY:  Brandon Sherman is here for a follow up and treatment. He presents to the clinic with his wife. He notes single agent chemo is tolerable but does not feel much difference except time of infusion. He notes fatigue only when he goes to walk. He can walk for a moderate distance because of his legs feeling week and SOB.    REVIEW OF SYSTEMS:   Constitutional: Denies fevers, chills or abnormal weight loss (+) Fatigue  Eyes: Denies blurriness of vision Ears, nose, mouth, throat, and face: Denies mucositis or sore throat Respiratory: Denies cough or wheezes (+) SOB  Cardiovascular: Denies palpitation, chest discomfort or lower extremity swelling Gastrointestinal:  Denies nausea, heartburn or change in bowel habits Skin: Denies abnormal skin rashes Lymphatics: Denies new lymphadenopathy or easy bruising Neurological:Denies numbness, tingling or new weaknesses (+) Leg weakness  Behavioral/Psych: Mood is stable, no new changes  All other systems were reviewed with the patient and are negative.  MEDICAL HISTORY:  Past Medical History:  Diagnosis Date  . Arthritis   . Diabetes (HCC)   . GERD (gastroesophageal reflux disease)   . Hyperlipidemia   . Hypertension   . Intrahepatic cholangiocarcinoma (HCC)     SURGICAL HISTORY: Past Surgical History:  Procedure Laterality Date  . APPENDECTOMY  1980  . COLONOSCOPY    . IR IMAGING GUIDED PORT INSERTION  02/23/2020    I have reviewed the social history and family history with the patient and they are unchanged from previous note.  ALLERGIES:  has No Known Allergies.  MEDICATIONS:  Current Outpatient Medications  Medication   Sig Dispense  Refill  . allopurinol (ZYLOPRIM) 100 MG tablet Take 100 mg by mouth daily.    . amLODipine (NORVASC) 2.5 MG tablet Take 2.5 mg by mouth daily.    . apixaban (ELIQUIS) 5 MG TABS tablet Take 1 tablet (5 mg total) by mouth 2 (two) times daily. 180 tablet 3  . fenofibrate (TRICOR) 145 MG tablet Take 145 mg by mouth daily.    . ferrous sulfate 325 (65 FE) MG tablet Take 1 tablet (325 mg total) by mouth daily. 30 tablet 0  . finasteride (PROSCAR) 5 MG tablet Take 5 mg by mouth daily.    . furosemide (LASIX) 20 MG tablet Take 1 tablet (20 mg total) by mouth every other day. 45 tablet 3  . lidocaine-prilocaine (EMLA) cream Apply 1 application topically as needed. 30 g 3  . lisinopril (ZESTRIL) 40 MG tablet Take 40 mg by mouth daily.    . magnesium oxide (MAG-OX) 400 MG tablet TAKE 1 TABLET BY MOUTH EVERY DAY 30 tablet 0  . metFORMIN (GLUCOPHAGE) 500 MG tablet Take 500 mg by mouth 2 (two) times daily with a meal.     . metoprolol succinate (TOPROL-XL) 25 MG 24 hr tablet TAKE 1 TABLET BY MOUTH EVERY DAY 30 tablet 0  . ondansetron (ZOFRAN) 8 MG tablet Take 1 tablet (8 mg total) by mouth 2 (two) times daily as needed. Start on the third day after chemotherapy. 30 tablet 1  . pantoprazole (PROTONIX) 40 MG tablet TAKE 1 TABLET BY MOUTH TWICE A DAY 180 tablet 0  . pravastatin (PRAVACHOL) 40 MG tablet Take 40 mg by mouth daily.    . prochlorperazine (COMPAZINE) 10 MG tablet Take 1 tablet (10 mg total) by mouth every 6 (six) hours as needed (Nausea or vomiting). 30 tablet 1  . sucralfate (CARAFATE) 1 g tablet TAKE 1 TABLET BY MOUTH EVERY 6 HOURS AS NEEDED. 180 tablet 0  . tamsulosin (FLOMAX) 0.4 MG CAPS capsule Take 0.4 mg by mouth daily.    . Vitamin D, Ergocalciferol, (DRISDOL) 1.25 MG (50000 UT) CAPS capsule Take 50,000 Units by mouth every 7 (seven) days.     No current facility-administered medications for this visit.   Facility-Administered Medications Ordered in Other Visits  Medication Dose Route  Frequency Provider Last Rate Last Admin  . prochlorperazine (COMPAZINE) tablet 10 mg  10 mg Oral Q6H PRN Katori Wirsing, MD   10 mg at 04/12/20 0936  . sodium chloride flush (NS) 0.9 % injection 10 mL  10 mL Intracatheter PRN Autrey Human, MD   10 mL at 04/12/20 1041    PHYSICAL EXAMINATION: ECOG PERFORMANCE STATUS: 2 - Symptomatic, <50% confined to bed  Vitals:   04/12/20 0852  BP: (!) 167/98  Pulse: 87  Resp: 18  Temp: (!) 97.5 F (36.4 C)  SpO2: 100%   Filed Weights   04/12/20 0852  Weight: 220 lb 9.6 oz (100.1 kg)    GENERAL:alert, no distress and comfortable SKIN: skin color, texture, turgor are normal, no rashes or significant lesions EYES: normal, Conjunctiva are pink and non-injected, sclera clear  NECK: supple, thyroid normal size, non-tender, without nodularity LYMPH:  no palpable lymphadenopathy in the cervical, axillary  LUNGS: clear to auscultation and percussion with normal breathing effort (+) Mild fluid collection at base of lungs.  HEART: regular rate & rhythm and no murmurs and no lower extremity edema ABDOMEN:abdomen soft, non-tender and normal bowel sounds Musculoskeletal:no cyanosis of digits and no clubbing  NEURO: alert &   oriented x 3 with fluent speech, no focal motor/sensory deficits  LABORATORY DATA:  I have reviewed the data as listed CBC Latest Ref Rng & Units 04/12/2020 03/29/2020 03/23/2020  WBC 4.0 - 10.5 K/uL 17.5(H) 2.1(L) 4.5  Hemoglobin 13.0 - 17.0 g/dL 9.5(L) 9.9(L) 10.4(L)  Hematocrit 39.0 - 52.0 % 29.6(L) 30.3(L) 32.2(L)  Platelets 150 - 400 K/uL 205 157 173     CMP Latest Ref Rng & Units 04/12/2020 03/29/2020 03/24/2020  Glucose 70 - 99 mg/dL 93 94 -  BUN 8 - 23 mg/dL 18 14 -  Creatinine 0.61 - 1.24 mg/dL 1.20 1.16 -  Sodium 135 - 145 mmol/L 143 140 -  Potassium 3.5 - 5.1 mmol/L 3.6 3.7 -  Chloride 98 - 111 mmol/L 107 107 -  CO2 22 - 32 mmol/L 25 22 -  Calcium 8.9 - 10.3 mg/dL 9.4 9.3 -  Total Protein 6.5 - 8.1 g/dL 6.4(L) 6.4(L) 6.1  Total  Bilirubin 0.3 - 1.2 mg/dL 0.6 0.6 1.0  Alkaline Phos 38 - 126 U/L 81 64 56  AST 15 - 41 U/L 16 37 19  ALT 0 - 44 U/L 10 24 9      RADIOGRAPHIC STUDIES: I have personally reviewed the radiological images as listed and agreed with the findings in the report. No results found.   ASSESSMENT & PLAN:  Brandon Sherman is a 74 y.o. male with   1.Intrahepatic cholangiocarcinoma, cT2N0Mx,unresectable,with indeterminate lung nodules -He was diagnosed in 07/2019.CT scans and MRI liver showa large7.3cm mass in the right hepatic lobe whichabutsportal vein.  -He was seen byour local surgeon Dr. Byerly andDr Clark at [facility],both concluded that cancer is not resectable due to the invasion to portal vein. -I discussed given his cancer is non-resectable his cancer is likely not curable but still treatable. Istarted him onstandardfirst line chemo with IV Cisplatin and Gemcitabine 2 weeks on/1 weekoff beginning 09/29/19.Cisplatin was held since C9 (03/23/20) due to fluid status/AFib. He is now on maintenance Gemcitabine alone.  -His FOresults showed MSI stable disease, IDH 1 mutation,so he may benefit from IDH inhibitor in the future. -He had PAC placed on 02/23/20.  -I personally reviewed and discussed her CAP from 04/08/20 which shows stable disease in the liver. Scan also showed b/l pleural effusion related to his AF. Will continue current maintenance therapy. He is tolerating moderately well with fatigue.  -Labs reviewed, WBC 17.5, hg 9.5, ANC 13.6, protein 6.4.  His leukocytosis is related to G-CSF after last cycle chemo, overall adequate to proceed with C10D1 Gemcitabine today. Will hold Fulphila this cycle as he may not need on single agent chemo.  -F/u in 3 weeks   2. Nausea, Low food intakeand weight loss, Epigastric pain  -With pain from GERD and nausea he initially lost 30 pounds. -His nausea and GERD is based on what he eats. Controlled onPhenergan, Sucralfate and Pepcid.  -Hewaspreviouslyseen by Dietician. -No current nausea or vomiting on chemo and using less antiemetics.For his mild constipation he can continue OTC stool softeners.His weight is stable as he is eating adequately. -He has no more nausea and able to gain weight. -He notes intermittent epigastric pain (S/p C7). This improves with Sucralfate. He will also continue antiacid.    3. DM, HTN, HLD, Gout, LE edema -On Metformin, amlodipine, lisinopril., allopurinol. Continue to f/u with his PCP  -He does have LE edema which he attributes to Gout. I previously added low dose IV lasix to infusions given crackling of lungs on 01/19/20 exam.     4. New onset AF -With PAC placement on 02/23/20 he was seen to have Afib. Based on CHADS2 Score 2. He is at moderate risk for stroke.  -He has Afib and mild tachycardia on 03/01/20 exam. He is now being seen by Cardiologist, on Eliquis, Lasix, Toprolol.  -He is scheduled to have a Cardioversion with Dr. Nelson on 04/27/20.  -His CT scan from 04/08/20 shows b/l pleural effusion.  -He notes leg weakness and SOB. I recommend he use recliner or lay with head elevated and to do some exercise to help strengthen his legs.   5. Nicotine Use -He never smoked but has been chewing Tobacco for the past 60 years.He no longer drinks alcohol. -He now chews 1-2 times a day.Iagainencouraged him toreduce and quit completely.  6.GERDand gastritis, history ofesophageal candidiasis -Hehad repeated EGDwith Dr. [name] in 07/2019. 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  -Will monitor andmayrepeat EGDin future   PLAN: -CT CAP reviewed, Stable disease -Labs reviewed and adequate to proceed with C10D1 Gemcitabine today, no Fulphila, will also cancel his lasix with chemo -Lab, flush andchemo Gemcitabine in 1, 3, 4 weeks  -F/u in 3 weeks   No problem-specific  Assessment & Plan notes found for this encounter.   No orders of the defined types were placed in this encounter.  All questions were answered. The patient knows to call the clinic with any problems, questions or concerns. No barriers to learning was detected. The total time spent in the appointment was 30 minutes.     Mckyla Deckman, MD 04/12/2020   I, Amoya Bennett, am acting as scribe for Jamesen Stahnke, MD.   I have reviewed the above documentation for accuracy and completeness, and I agree with the above.       

## 2020-04-12 ENCOUNTER — Inpatient Hospital Stay: Payer: Medicare HMO

## 2020-04-12 ENCOUNTER — Inpatient Hospital Stay (HOSPITAL_BASED_OUTPATIENT_CLINIC_OR_DEPARTMENT_OTHER): Payer: Medicare HMO | Admitting: Hematology

## 2020-04-12 ENCOUNTER — Other Ambulatory Visit: Payer: Self-pay

## 2020-04-12 ENCOUNTER — Encounter: Payer: Self-pay | Admitting: Hematology

## 2020-04-12 VITALS — BP 167/98 | HR 87 | Temp 97.5°F | Resp 18 | Ht 71.0 in | Wt 220.6 lb

## 2020-04-12 DIAGNOSIS — Z87891 Personal history of nicotine dependence: Secondary | ICD-10-CM | POA: Diagnosis not present

## 2020-04-12 DIAGNOSIS — J90 Pleural effusion, not elsewhere classified: Secondary | ICD-10-CM | POA: Diagnosis not present

## 2020-04-12 DIAGNOSIS — Z95828 Presence of other vascular implants and grafts: Secondary | ICD-10-CM

## 2020-04-12 DIAGNOSIS — C221 Intrahepatic bile duct carcinoma: Secondary | ICD-10-CM

## 2020-04-12 DIAGNOSIS — Z5111 Encounter for antineoplastic chemotherapy: Secondary | ICD-10-CM | POA: Diagnosis not present

## 2020-04-12 DIAGNOSIS — Z5189 Encounter for other specified aftercare: Secondary | ICD-10-CM | POA: Diagnosis not present

## 2020-04-12 DIAGNOSIS — R0602 Shortness of breath: Secondary | ICD-10-CM | POA: Diagnosis not present

## 2020-04-12 DIAGNOSIS — R600 Localized edema: Secondary | ICD-10-CM | POA: Diagnosis not present

## 2020-04-12 DIAGNOSIS — Z7189 Other specified counseling: Secondary | ICD-10-CM

## 2020-04-12 LAB — CBC WITH DIFFERENTIAL (CANCER CENTER ONLY)
Abs Immature Granulocytes: 0.45 10*3/uL — ABNORMAL HIGH (ref 0.00–0.07)
Basophils Absolute: 0.1 10*3/uL (ref 0.0–0.1)
Basophils Relative: 0 %
Eosinophils Absolute: 0.1 10*3/uL (ref 0.0–0.5)
Eosinophils Relative: 1 %
HCT: 29.6 % — ABNORMAL LOW (ref 39.0–52.0)
Hemoglobin: 9.5 g/dL — ABNORMAL LOW (ref 13.0–17.0)
Immature Granulocytes: 3 %
Lymphocytes Relative: 11 %
Lymphs Abs: 2 10*3/uL (ref 0.7–4.0)
MCH: 34.5 pg — ABNORMAL HIGH (ref 26.0–34.0)
MCHC: 32.1 g/dL (ref 30.0–36.0)
MCV: 107.6 fL — ABNORMAL HIGH (ref 80.0–100.0)
Monocytes Absolute: 1.4 10*3/uL — ABNORMAL HIGH (ref 0.1–1.0)
Monocytes Relative: 8 %
Neutro Abs: 13.6 10*3/uL — ABNORMAL HIGH (ref 1.7–7.7)
Neutrophils Relative %: 77 %
Platelet Count: 205 10*3/uL (ref 150–400)
RBC: 2.75 MIL/uL — ABNORMAL LOW (ref 4.22–5.81)
RDW: 20.1 % — ABNORMAL HIGH (ref 11.5–15.5)
WBC Count: 17.5 10*3/uL — ABNORMAL HIGH (ref 4.0–10.5)
nRBC: 0.5 % — ABNORMAL HIGH (ref 0.0–0.2)

## 2020-04-12 LAB — CMP (CANCER CENTER ONLY)
ALT: 10 U/L (ref 0–44)
AST: 16 U/L (ref 15–41)
Albumin: 3.8 g/dL (ref 3.5–5.0)
Alkaline Phosphatase: 81 U/L (ref 38–126)
Anion gap: 11 (ref 5–15)
BUN: 18 mg/dL (ref 8–23)
CO2: 25 mmol/L (ref 22–32)
Calcium: 9.4 mg/dL (ref 8.9–10.3)
Chloride: 107 mmol/L (ref 98–111)
Creatinine: 1.2 mg/dL (ref 0.61–1.24)
GFR, Est AFR Am: >60 mL/min (ref >60)
GFR, Estimated: >60 mL/min (ref >60)
Glucose, Bld: 93 mg/dL (ref 70–99)
Potassium: 3.6 mmol/L (ref 3.5–5.1)
Sodium: 143 mmol/L (ref 135–145)
Total Bilirubin: 0.6 mg/dL (ref 0.3–1.2)
Total Protein: 6.4 g/dL — ABNORMAL LOW (ref 6.5–8.1)

## 2020-04-12 MED ORDER — HEPARIN SOD (PORK) LOCK FLUSH 100 UNIT/ML IV SOLN
500.0000 [IU] | Freq: Once | INTRAVENOUS | Status: AC | PRN
  Administered 2020-04-12: 500 [IU]
  Filled 2020-04-12: qty 5

## 2020-04-12 MED ORDER — PROCHLORPERAZINE MALEATE 10 MG PO TABS
ORAL_TABLET | ORAL | Status: AC
  Filled 2020-04-12: qty 1

## 2020-04-12 MED ORDER — SODIUM CHLORIDE 0.9 % IV SOLN
1000.0000 mg/m2 | Freq: Once | INTRAVENOUS | Status: AC
  Administered 2020-04-12: 2166 mg via INTRAVENOUS
  Filled 2020-04-12: qty 56.97

## 2020-04-12 MED ORDER — PROCHLORPERAZINE MALEATE 10 MG PO TABS
10.0000 mg | ORAL_TABLET | Freq: Four times a day (QID) | ORAL | Status: DC | PRN
  Administered 2020-04-12: 10 mg via ORAL

## 2020-04-12 MED ORDER — SODIUM CHLORIDE 0.9% FLUSH
10.0000 mL | INTRAVENOUS | Status: DC | PRN
  Administered 2020-04-12: 10 mL
  Filled 2020-04-12: qty 10

## 2020-04-12 MED ORDER — SODIUM CHLORIDE 0.9 % IV SOLN
Freq: Once | INTRAVENOUS | Status: AC
  Filled 2020-04-12: qty 250

## 2020-04-12 MED ORDER — SODIUM CHLORIDE 0.9% FLUSH
10.0000 mL | Freq: Once | INTRAVENOUS | Status: AC
  Administered 2020-04-12: 10 mL
  Filled 2020-04-12: qty 10

## 2020-04-12 NOTE — Patient Instructions (Signed)
[facility] Discharge Instructions for Patients Receiving Chemotherapy  Today you received the following chemotherapy agents Gemcitabine (GEMZAR).  To help prevent nausea and vomiting after your treatment, we encourage you to take your nausea medication as prescribed.   If you develop nausea and vomiting that is not controlled by your nausea medication, call the clinic.   BELOW ARE SYMPTOMS THAT SHOULD BE REPORTED IMMEDIATELY:  *FEVER GREATER THAN 100.5 F  *CHILLS WITH OR WITHOUT FEVER  NAUSEA AND VOMITING THAT IS NOT CONTROLLED WITH YOUR NAUSEA MEDICATION  *UNUSUAL SHORTNESS OF BREATH  *UNUSUAL BRUISING OR BLEEDING  TENDERNESS IN MOUTH AND THROAT WITH OR WITHOUT PRESENCE OF ULCERS  *URINARY PROBLEMS  *BOWEL PROBLEMS  UNUSUAL RASH Items with * indicate a potential emergency and should be followed up as soon as possible.  Feel free to call the clinic should you have any questions or concerns. The clinic phone number is [phone].  Please show the [facility] at check-in to the Emergency Department and triage nurse.  Magnesium Sulfate injection What is this medicine? MAGNESIUM SULFATE (mag NEE zee um SUL fate) is an electrolyte injection commonly used to treat low magnesium levels in your blood. It is also used to prevent or control seizures in women with preeclampsia or eclampsia. This medicine may be used for other purposes; ask your health care provider or pharmacist if you have questions. What should I tell my health care provider before I take this medicine? They need to know if you have any of these conditions:  heart disease  history of irregular heart beat  kidney disease  an unusual or allergic reaction to magnesium sulfate, medicines, foods, dyes, or preservatives  pregnant or trying to get pregnant  breast-feeding How should I use this medicine? This medicine is for infusion into a vein. It is given by a health care professional in  a hospital or clinic setting. Talk to your pediatrician regarding the use of this medicine in children. While this drug may be prescribed for selected conditions, precautions do apply. Overdosage: If you think you have taken too much of this medicine contact a poison control center or emergency room at once. NOTE: This medicine is only for you. Do not share this medicine with others. What if I miss a dose? This does not apply. What may interact with this medicine? This medicine may interact with the following medications:  certain medicines for anxiety or sleep  certain medicines for seizures like phenobarbital  digoxin  medicines that relax muscles for surgery  narcotic medicines for pain This list may not describe all possible interactions. Give your health care provider a list of all the medicines, herbs, non-prescription drugs, or dietary supplements you use. Also tell them if you smoke, drink alcohol, or use illegal drugs. Some items may interact with your medicine. What should I watch for while using this medicine? Your condition will be monitored carefully while you are receiving this medicine. You may need blood work done while you are receiving this medicine. What side effects may I notice from receiving this medicine? Side effects that you should report to your doctor or health care professional as soon as possible:  allergic reactions like skin rash, itching or hives, swelling of the face, lips, or tongue  facial flushing  muscle weakness  signs and symptoms of low blood pressure like dizziness; feeling faint or lightheaded, falls; unusually weak or tired  signs and symptoms of a dangerous change in heartbeat or heart  rhythm like chest pain; dizziness; fast or irregular heartbeat; palpitations; breathing problems  sweating This list may not describe all possible side effects. Call your doctor for medical advice about side effects. You may report side effects to FDA at  1-800-FDA-1088. Where should I keep my medicine? This drug is given in a hospital or clinic and will not be stored at home. NOTE: This sheet is a summary. It may not cover all possible information. If you have questions about this medicine, talk to your doctor, pharmacist, or health care provider.  2020 Elsevier/Gold Standard (2016-05-24 12:31:42)

## 2020-04-13 ENCOUNTER — Telehealth: Payer: Self-pay | Admitting: Hematology

## 2020-04-13 ENCOUNTER — Ambulatory Visit (HOSPITAL_COMMUNITY): Payer: Medicare HMO | Attending: Cardiology

## 2020-04-13 ENCOUNTER — Ambulatory Visit (INDEPENDENT_AMBULATORY_CARE_PROVIDER_SITE_OTHER)
Admission: RE | Admit: 2020-04-13 | Discharge: 2020-04-13 | Disposition: A | Discharge status: Home / Self Care | Payer: Self-pay | Source: Ambulatory Visit | Attending: Cardiovascular Disease | Admitting: Cardiovascular Disease

## 2020-04-13 DIAGNOSIS — R0602 Shortness of breath: Secondary | ICD-10-CM | POA: Diagnosis not present

## 2020-04-13 IMAGING — CT CT CARDIAC CORONARY ARTERY CALCIUM SCORE
3 series · 14 of 20 positions shown, 15 images · non-contrast
Comparison: None.
COMPARISON: None.

Addendum:
EXAM:
OVER-READ INTERPRETATION  CT CHEST

The following report is an over-read performed by radiologist Dr.
MINI [REDACTED] on [DATE]. This
over-read does not include interpretation of cardiac or coronary
anatomy or pathology. The coronary calcium score interpretation by
the cardiologist is attached.
CLINICAL DATA: Risk stratification
Coronary Calcium Score
TECHNIQUE: The patient was scanned on a Siemens Force scanner. Axial
non-contrast 3 mm slices were carried out through the heart. The
data set was analyzed on a dedicated work station and scored using
the Agatson method.

[Series 7: casc 3.0 bv41 2 bestsyst 263 ms · axial · 0.45mm/px · z∈[-260,-176]mm · 4 of 48 slices shown, 5 images]
[im 10/48  vessel]
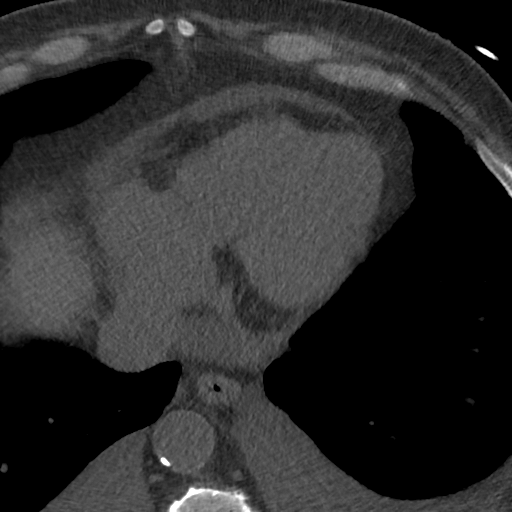
[im 10/48  lung]
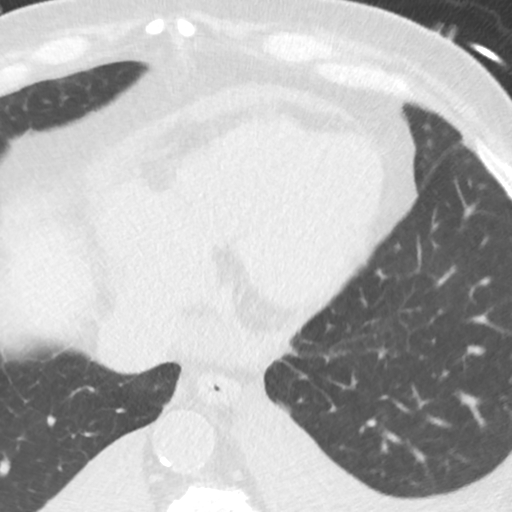
[im 19/48  vessel]
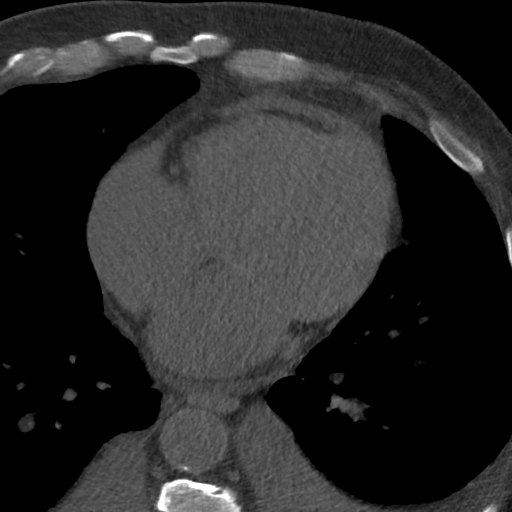
[im 29/48  vessel]
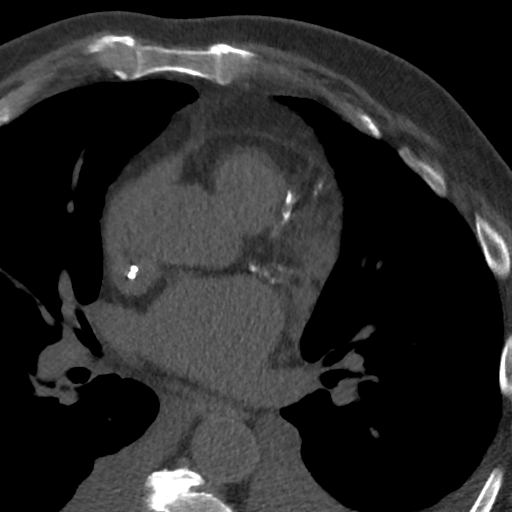
[im 38/48  vessel]
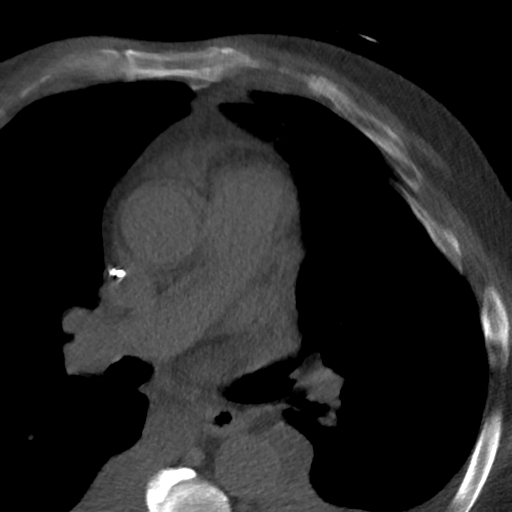

[Series 8: lung 263 ms · axial · 0.76mm/px · z∈[-266,-170]mm · 5 of 48 slices shown]
[im 8/48  lung]
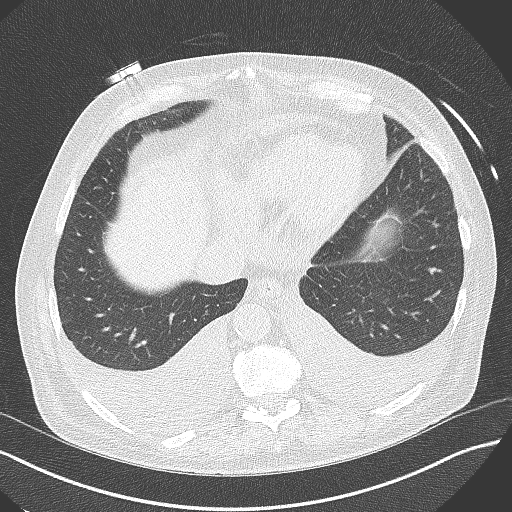
[im 16/48  lung]
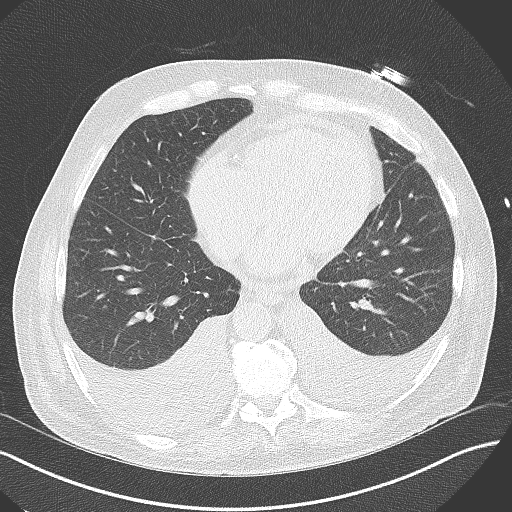
[im 24/48  lung]
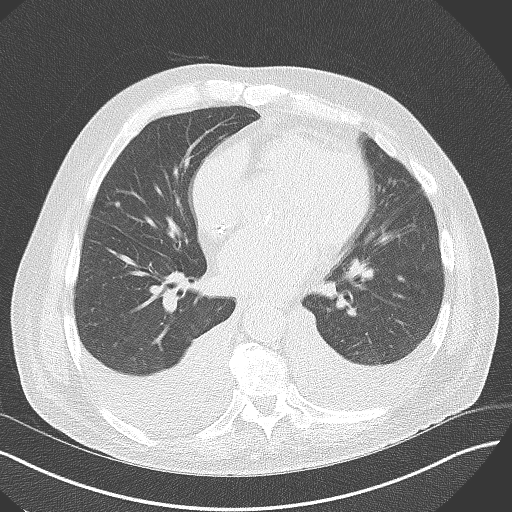
[im 32/48  lung]
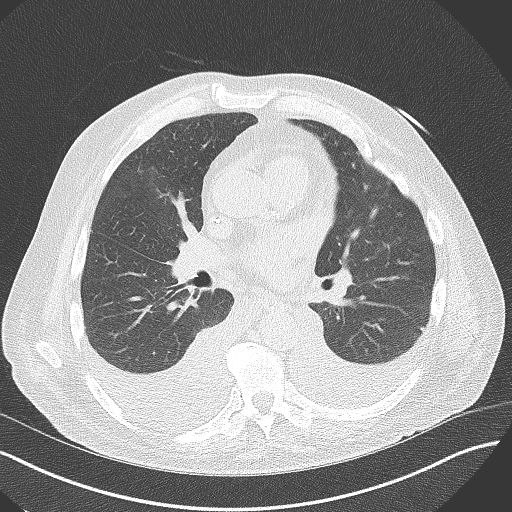
[im 40/48  lung]
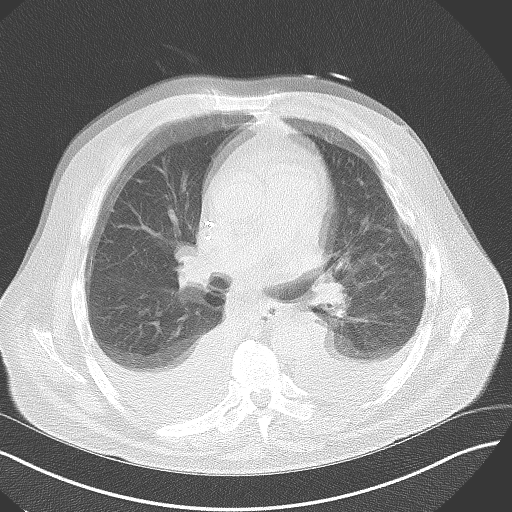

[Series 9: lung st 263 ms · axial · 0.76mm/px · z∈[-266,-170]mm · 5 of 48 slices shown]
[im 8/48  lung]
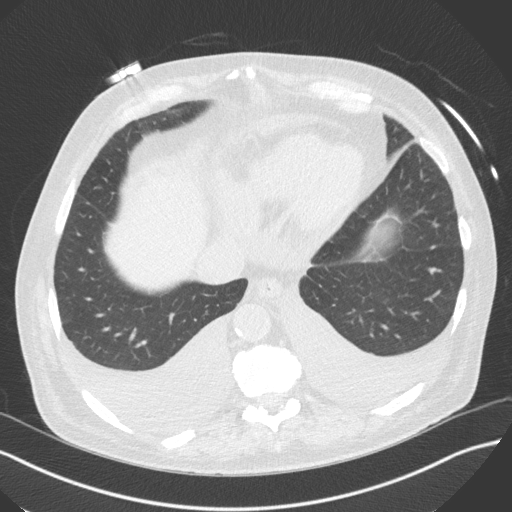
[im 16/48  lung]
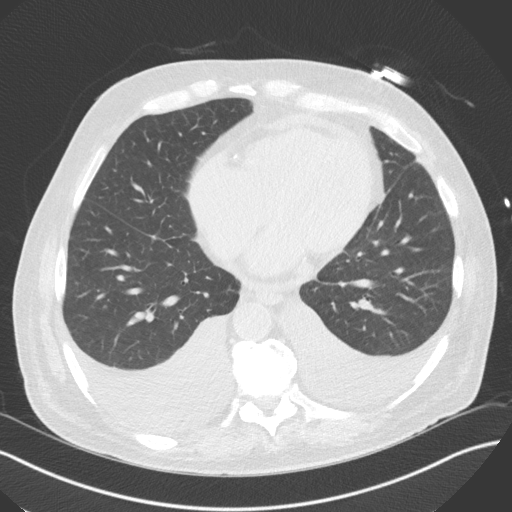
[im 24/48  lung]
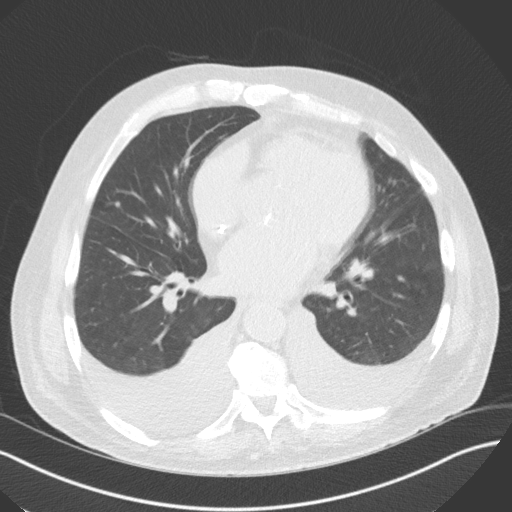
[im 32/48  lung]
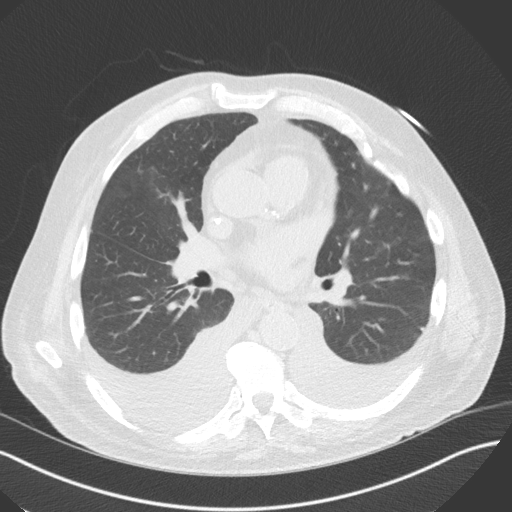
[im 40/48  lung]
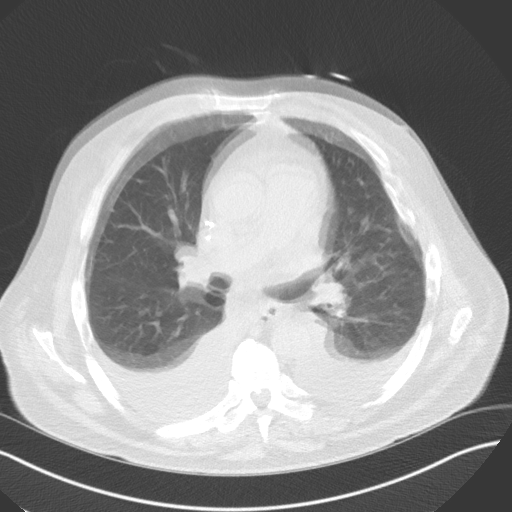

[14 of 20 positions shown; findings below may reference images not displayed]

FINDINGS: Central venous catheter with tip terminating in the right atrium.
Aortic atherosclerosis. Moderate bilateral pleural effusions lying
dependently. Multiple small pulmonary nodules are noted in the lungs
bilaterally, largest of which is in the right lower lobe (axial
image 19 of series 8) measuring 7 x 3 mm (mean diameter 5 mm).
Within the visualized portions of the thorax there are no larger
more suspicious appearing pulmonary nodules or masses, there is no
acute consolidative airspace disease, no pneumothorax and no
lymphadenopathy. Visualized portions of the upper abdomen are
unremarkable. There are no aggressive appearing lytic or blastic
lesions noted in the visualized portions of the skeleton.
IMPRESSION: 1. Moderate bilateral pleural effusions again noted.
2. Multiple small pulmonary nodules scattered throughout the lungs
bilaterally measuring 5 mm or less in size, nonspecific, but
statistically likely benign. No follow-up needed if patient is
low-risk (and has no known or suspected primary neoplasm).
Non-contrast chest CT can be considered in 12 months if patient is
high-risk. This recommendation follows the consensus statement:
Guidelines for Management of Incidental Pulmonary Nodules Detected
FINDINGS: Non-cardiac: See separate report from [REDACTED].

Ascending Aorta: Normal caliber.

Pericardium: Small pericardial effusion.

Coronary arteries: Normal origins.
IMPRESSION: 1. Coronary calcium score of 931. This was 93rd percentile for age
and sex matched controls.

2.  Small pericardial effusion.

*** End of Addendum ***
EXAM:
OVER-READ INTERPRETATION  CT CHEST

The following report is an over-read performed by radiologist Dr.
MINI [REDACTED] on [DATE]. This
over-read does not include interpretation of cardiac or coronary
anatomy or pathology. The coronary calcium score interpretation by
the cardiologist is attached.
FINDINGS: Central venous catheter with tip terminating in the right atrium.
Aortic atherosclerosis. Moderate bilateral pleural effusions lying
dependently. Multiple small pulmonary nodules are noted in the lungs
bilaterally, largest of which is in the right lower lobe (axial
image 19 of series 8) measuring 7 x 3 mm (mean diameter 5 mm).
Within the visualized portions of the thorax there are no larger
more suspicious appearing pulmonary nodules or masses, there is no
acute consolidative airspace disease, no pneumothorax and no
lymphadenopathy. Visualized portions of the upper abdomen are
unremarkable. There are no aggressive appearing lytic or blastic
lesions noted in the visualized portions of the skeleton.
IMPRESSION: 1. Moderate bilateral pleural effusions again noted.
2. Multiple small pulmonary nodules scattered throughout the lungs
bilaterally measuring 5 mm or less in size, nonspecific, but
statistically likely benign. No follow-up needed if patient is
low-risk (and has no known or suspected primary neoplasm).
Non-contrast chest CT can be considered in 12 months if patient is
high-risk. This recommendation follows the consensus statement:
Guidelines for Management of Incidental Pulmonary Nodules Detected

## 2020-04-13 MED ORDER — PERFLUTREN LIPID MICROSPHERE
1.0000 mL | INTRAVENOUS | Status: AC | PRN
  Administered 2020-04-13: 1 mL via INTRAVENOUS

## 2020-04-13 NOTE — Telephone Encounter (Signed)
Scheduled appt per 5/24 los.  Pt will get an updated appt calendar at their next scheduled appt.

## 2020-04-13 NOTE — Progress Notes (Signed)
Pharmacist Chemotherapy Monitoring - Follow Up Assessment    I verify that I have reviewed each item in the below checklist:  . Regimen for the patient is scheduled for the appropriate day and plan matches scheduled date. Marland Kitchen Appropriate non-routine labs are ordered dependent on drug ordered. . If applicable, additional medications reviewed and ordered per protocol based on lifetime cumulative doses and/or treatment regimen.   Plan for follow-up and/or issues identified: No . I-vent associated with next due treatment: No . MD and/or nursing notified: No  Natoshia Souter K 04/13/2020 2:29 PM

## 2020-04-15 ENCOUNTER — Other Ambulatory Visit: Payer: Self-pay | Admitting: Hematology

## 2020-04-15 ENCOUNTER — Telehealth: Payer: Self-pay | Admitting: Cardiovascular Disease

## 2020-04-15 DIAGNOSIS — R0602 Shortness of breath: Secondary | ICD-10-CM

## 2020-04-15 MED ORDER — METOPROLOL SUCCINATE ER 25 MG PO TB24: 25.0000 mg | ORAL_TABLET | Freq: Every day | ORAL | 6 refills | Status: DC

## 2020-04-15 NOTE — Telephone Encounter (Signed)
Pt and pt's wife aware of ca score results ./cy

## 2020-04-15 NOTE — Telephone Encounter (Signed)
Patient's wife returning call for CT results. She states she would like to get a call back before 11:00am.

## 2020-04-20 ENCOUNTER — Inpatient Hospital Stay: Payer: Medicare HMO

## 2020-04-20 ENCOUNTER — Telehealth: Payer: Self-pay | Admitting: Cardiovascular Disease

## 2020-04-20 ENCOUNTER — Other Ambulatory Visit: Payer: Self-pay

## 2020-04-20 ENCOUNTER — Inpatient Hospital Stay: Payer: Medicare HMO | Attending: Hematology

## 2020-04-20 VITALS — BP 158/86 | HR 132 | Temp 98.2°F | Resp 18

## 2020-04-20 DIAGNOSIS — Z5111 Encounter for antineoplastic chemotherapy: Secondary | ICD-10-CM | POA: Diagnosis not present

## 2020-04-20 DIAGNOSIS — C221 Intrahepatic bile duct carcinoma: Secondary | ICD-10-CM | POA: Diagnosis not present

## 2020-04-20 DIAGNOSIS — Z95828 Presence of other vascular implants and grafts: Secondary | ICD-10-CM

## 2020-04-20 DIAGNOSIS — Z7189 Other specified counseling: Secondary | ICD-10-CM

## 2020-04-20 LAB — CMP (CANCER CENTER ONLY)
ALT: 13 U/L (ref 0–44)
AST: 20 U/L (ref 15–41)
Albumin: 3.6 g/dL (ref 3.5–5.0)
Alkaline Phosphatase: 65 U/L (ref 38–126)
Anion gap: 11 (ref 5–15)
BUN: 14 mg/dL (ref 8–23)
CO2: 21 mmol/L — ABNORMAL LOW (ref 22–32)
Calcium: 9.4 mg/dL (ref 8.9–10.3)
Chloride: 109 mmol/L (ref 98–111)
Creatinine: 1.32 mg/dL — ABNORMAL HIGH (ref 0.61–1.24)
GFR, Est AFR Am: >60 mL/min (ref >60)
GFR, Estimated: 54 mL/min — ABNORMAL LOW (ref >60)
Glucose, Bld: 100 mg/dL — ABNORMAL HIGH (ref 70–99)
Potassium: 3.6 mmol/L (ref 3.5–5.1)
Sodium: 141 mmol/L (ref 135–145)
Total Bilirubin: 0.5 mg/dL (ref 0.3–1.2)
Total Protein: 6.3 g/dL — ABNORMAL LOW (ref 6.5–8.1)

## 2020-04-20 LAB — CBC WITH DIFFERENTIAL (CANCER CENTER ONLY)
Abs Immature Granulocytes: 0.08 10*3/uL — ABNORMAL HIGH (ref 0.00–0.07)
Basophils Absolute: 0.1 10*3/uL (ref 0.0–0.1)
Basophils Relative: 1 %
Eosinophils Absolute: 0.1 10*3/uL (ref 0.0–0.5)
Eosinophils Relative: 1 %
HCT: 27.6 % — ABNORMAL LOW (ref 39.0–52.0)
Hemoglobin: 9 g/dL — ABNORMAL LOW (ref 13.0–17.0)
Immature Granulocytes: 1 %
Lymphocytes Relative: 22 %
Lymphs Abs: 1.4 10*3/uL (ref 0.7–4.0)
MCH: 34.6 pg — ABNORMAL HIGH (ref 26.0–34.0)
MCHC: 32.6 g/dL (ref 30.0–36.0)
MCV: 106.2 fL — ABNORMAL HIGH (ref 80.0–100.0)
Monocytes Absolute: 0.8 10*3/uL (ref 0.1–1.0)
Monocytes Relative: 12 %
Neutro Abs: 3.9 10*3/uL (ref 1.7–7.7)
Neutrophils Relative %: 63 %
Platelet Count: 173 10*3/uL (ref 150–400)
RBC: 2.6 MIL/uL — ABNORMAL LOW (ref 4.22–5.81)
RDW: 18.8 % — ABNORMAL HIGH (ref 11.5–15.5)
WBC Count: 6.3 10*3/uL (ref 4.0–10.5)
nRBC: 0 % (ref 0.0–0.2)

## 2020-04-20 MED ORDER — SODIUM CHLORIDE 0.9 % IV SOLN
1000.0000 mg/m2 | Freq: Once | INTRAVENOUS | Status: AC
  Administered 2020-04-20: 2166 mg via INTRAVENOUS
  Filled 2020-04-20: qty 56.97

## 2020-04-20 MED ORDER — HEPARIN SOD (PORK) LOCK FLUSH 100 UNIT/ML IV SOLN
500.0000 [IU] | Freq: Once | INTRAVENOUS | Status: AC | PRN
  Administered 2020-04-20: 500 [IU]
  Filled 2020-04-20: qty 5

## 2020-04-20 MED ORDER — PROCHLORPERAZINE MALEATE 10 MG PO TABS
ORAL_TABLET | ORAL | Status: AC
  Filled 2020-04-20: qty 1

## 2020-04-20 MED ORDER — PROCHLORPERAZINE MALEATE 10 MG PO TABS
10.0000 mg | ORAL_TABLET | Freq: Four times a day (QID) | ORAL | Status: DC | PRN
  Administered 2020-04-20: 10 mg via ORAL

## 2020-04-20 MED ORDER — SODIUM CHLORIDE 0.9% FLUSH
10.0000 mL | INTRAVENOUS | Status: DC | PRN
  Administered 2020-04-20: 10 mL
  Filled 2020-04-20: qty 10

## 2020-04-20 MED ORDER — SODIUM CHLORIDE 0.9 % IV SOLN
Freq: Once | INTRAVENOUS | Status: AC
  Filled 2020-04-20: qty 250

## 2020-04-20 MED ORDER — SODIUM CHLORIDE 0.9% FLUSH
10.0000 mL | Freq: Once | INTRAVENOUS | Status: AC
  Administered 2020-04-20: 10 mL
  Filled 2020-04-20: qty 10

## 2020-04-20 NOTE — Patient Instructions (Signed)
[facility] Discharge Instructions for Patients Receiving Chemotherapy  Today you received the following chemotherapy agents Gemcitabine (GEMZAR).  To help prevent nausea and vomiting after your treatment, we encourage you to take your nausea medication as prescribed.   If you develop nausea and vomiting that is not controlled by your nausea medication, call the clinic.   BELOW ARE SYMPTOMS THAT SHOULD BE REPORTED IMMEDIATELY:  *FEVER GREATER THAN 100.5 F  *CHILLS WITH OR WITHOUT FEVER  NAUSEA AND VOMITING THAT IS NOT CONTROLLED WITH YOUR NAUSEA MEDICATION  *UNUSUAL SHORTNESS OF BREATH  *UNUSUAL BRUISING OR BLEEDING  TENDERNESS IN MOUTH AND THROAT WITH OR WITHOUT PRESENCE OF ULCERS  *URINARY PROBLEMS  *BOWEL PROBLEMS  UNUSUAL RASH Items with * indicate a potential emergency and should be followed up as soon as possible.  Feel free to call the clinic should you have any questions or concerns. The clinic phone number is [phone].  Please show the [place] at check-in to the Emergency Department and triage nurse.  Magnesium Sulfate injection What is this medicine? MAGNESIUM SULFATE (mag NEE zee um SUL fate) is an electrolyte injection commonly used to treat low magnesium levels in your blood. It is also used to prevent or control seizures in women with preeclampsia or eclampsia. This medicine may be used for other purposes; ask your health care provider or pharmacist if you have questions. What should I tell my health care provider before I take this medicine? They need to know if you have any of these conditions:  heart disease  history of irregular heart beat  kidney disease  an unusual or allergic reaction to magnesium sulfate, medicines, foods, dyes, or preservatives  pregnant or trying to get pregnant  breast-feeding How should I use this medicine? This medicine is for infusion into a vein. It is given by a health care professional in  a hospital or clinic setting. Talk to your pediatrician regarding the use of this medicine in children. While this drug may be prescribed for selected conditions, precautions do apply. Overdosage: If you think you have taken too much of this medicine contact a poison control center or emergency room at once. NOTE: This medicine is only for you. Do not share this medicine with others. What if I miss a dose? This does not apply. What may interact with this medicine? This medicine may interact with the following medications:  certain medicines for anxiety or sleep  certain medicines for seizures like phenobarbital  digoxin  medicines that relax muscles for surgery  narcotic medicines for pain This list may not describe all possible interactions. Give your health care provider a list of all the medicines, herbs, non-prescription drugs, or dietary supplements you use. Also tell them if you smoke, drink alcohol, or use illegal drugs. Some items may interact with your medicine. What should I watch for while using this medicine? Your condition will be monitored carefully while you are receiving this medicine. You may need blood work done while you are receiving this medicine. What side effects may I notice from receiving this medicine? Side effects that you should report to your doctor or health care professional as soon as possible:  allergic reactions like skin rash, itching or hives, swelling of the face, lips, or tongue  facial flushing  muscle weakness  signs and symptoms of low blood pressure like dizziness; feeling faint or lightheaded, falls; unusually weak or tired  signs and symptoms of a dangerous change in heartbeat or heart  rhythm like chest pain; dizziness; fast or irregular heartbeat; palpitations; breathing problems  sweating This list may not describe all possible side effects. Call your doctor for medical advice about side effects. You may report side effects to FDA at  1-800-FDA-1088. Where should I keep my medicine? This drug is given in a hospital or clinic and will not be stored at home. NOTE: This sheet is a summary. It may not cover all possible information. If you have questions about this medicine, talk to your doctor, pharmacist, or health care provider.  2020 Elsevier/Gold Standard (2016-05-24 12:31:42)

## 2020-04-20 NOTE — Progress Notes (Signed)
Pt currently in afib. HR 132. Asymptomatic. VSS Scheduled for cardiology workup next week with Dr. Annette Stable. Okay to treat per Dr. Burr Medico.

## 2020-04-20 NOTE — Telephone Encounter (Signed)
New Message  Pt's wife called and said that the pt is supposed to have his heart shocked on 04/27/20. Said that the nurse stated that they wanted to do a stress test before then. The next available day that's available is on 04/28/20. Please call to discuss

## 2020-04-20 NOTE — Telephone Encounter (Signed)
Left message for patient to call back. No DPR, will need to speak with patient.

## 2020-04-21 ENCOUNTER — Telehealth: Payer: Self-pay

## 2020-04-21 DIAGNOSIS — I482 Chronic atrial fibrillation, unspecified: Secondary | ICD-10-CM

## 2020-04-21 DIAGNOSIS — Z01812 Encounter for preprocedural laboratory examination: Secondary | ICD-10-CM

## 2020-04-21 DIAGNOSIS — R0602 Shortness of breath: Secondary | ICD-10-CM

## 2020-04-21 DIAGNOSIS — I2510 Atherosclerotic heart disease of native coronary artery without angina pectoris: Secondary | ICD-10-CM

## 2020-04-21 DIAGNOSIS — I351 Nonrheumatic aortic (valve) insufficiency: Secondary | ICD-10-CM

## 2020-04-21 NOTE — Telephone Encounter (Signed)
Spoke to patient's wife echo results given.Advised to repeat in 12 months.

## 2020-04-21 NOTE — Telephone Encounter (Signed)
Duplicate phone note.

## 2020-04-21 NOTE — Telephone Encounter (Signed)
Spoke to patient's wife.Cardioversion instructions reviewed.She voiced understanding.She wanted to know if he needs to have lexiscan done before cardioversion.Advised better to have heart in rhythm for lexiscan.Advised to keep [place] appointment as planned 6/16.Follow up appointment scheduled with Dr.Berry 6/22 at 10:00 am.

## 2020-04-21 NOTE — Telephone Encounter (Signed)
Patient's wife returned your call

## 2020-04-24 ENCOUNTER — Other Ambulatory Visit (HOSPITAL_COMMUNITY)
Admission: RE | Admit: 2020-04-24 | Discharge: 2020-04-24 | Disposition: A | Discharge status: Home / Self Care | Payer: Medicare HMO | Source: Ambulatory Visit | Attending: Cardiology | Admitting: Cardiology

## 2020-04-24 DIAGNOSIS — Z01812 Encounter for preprocedural laboratory examination: Secondary | ICD-10-CM | POA: Diagnosis not present

## 2020-04-24 DIAGNOSIS — Z20822 Contact with and (suspected) exposure to covid-19: Secondary | ICD-10-CM | POA: Insufficient documentation

## 2020-04-24 LAB — SARS CORONAVIRUS 2 (TAT 6-24 HRS): SARS Coronavirus 2: NEGATIVE

## 2020-04-27 ENCOUNTER — Ambulatory Visit (HOSPITAL_COMMUNITY): Payer: Medicare HMO | Admitting: Certified Registered Nurse Anesthetist

## 2020-04-27 ENCOUNTER — Encounter (HOSPITAL_COMMUNITY)
Admission: RE | Disposition: A | Discharge status: Home / Self Care | Payer: Self-pay | Source: Home / Self Care | Attending: Cardiology

## 2020-04-27 ENCOUNTER — Other Ambulatory Visit: Payer: Self-pay

## 2020-04-27 ENCOUNTER — Ambulatory Visit (HOSPITAL_COMMUNITY)
Admission: RE | Admit: 2020-04-27 | Discharge: 2020-04-27 | Disposition: A | Discharge status: Home / Self Care | Payer: Medicare HMO | Attending: Cardiology | Admitting: Cardiology

## 2020-04-27 ENCOUNTER — Encounter (HOSPITAL_COMMUNITY): Payer: Self-pay | Admitting: Cardiology

## 2020-04-27 DIAGNOSIS — Z7984 Long term (current) use of oral hypoglycemic drugs: Secondary | ICD-10-CM | POA: Diagnosis not present

## 2020-04-27 DIAGNOSIS — C221 Intrahepatic bile duct carcinoma: Secondary | ICD-10-CM | POA: Diagnosis not present

## 2020-04-27 DIAGNOSIS — M109 Gout, unspecified: Secondary | ICD-10-CM | POA: Diagnosis not present

## 2020-04-27 DIAGNOSIS — I10 Essential (primary) hypertension: Secondary | ICD-10-CM | POA: Diagnosis not present

## 2020-04-27 DIAGNOSIS — E785 Hyperlipidemia, unspecified: Secondary | ICD-10-CM | POA: Insufficient documentation

## 2020-04-27 DIAGNOSIS — I4891 Unspecified atrial fibrillation: Secondary | ICD-10-CM | POA: Diagnosis not present

## 2020-04-27 DIAGNOSIS — I700 Atherosclerosis of aorta: Secondary | ICD-10-CM | POA: Diagnosis not present

## 2020-04-27 DIAGNOSIS — Z7901 Long term (current) use of anticoagulants: Secondary | ICD-10-CM | POA: Diagnosis not present

## 2020-04-27 DIAGNOSIS — Z9221 Personal history of antineoplastic chemotherapy: Secondary | ICD-10-CM | POA: Diagnosis not present

## 2020-04-27 DIAGNOSIS — Z79899 Other long term (current) drug therapy: Secondary | ICD-10-CM | POA: Insufficient documentation

## 2020-04-27 DIAGNOSIS — F17290 Nicotine dependence, other tobacco product, uncomplicated: Secondary | ICD-10-CM | POA: Insufficient documentation

## 2020-04-27 DIAGNOSIS — I2510 Atherosclerotic heart disease of native coronary artery without angina pectoris: Secondary | ICD-10-CM | POA: Diagnosis not present

## 2020-04-27 DIAGNOSIS — K219 Gastro-esophageal reflux disease without esophagitis: Secondary | ICD-10-CM | POA: Insufficient documentation

## 2020-04-27 DIAGNOSIS — E119 Type 2 diabetes mellitus without complications: Secondary | ICD-10-CM | POA: Diagnosis not present

## 2020-04-27 HISTORY — PX: CARDIOVERSION: SHX1299

## 2020-04-27 LAB — GLUCOSE, CAPILLARY: Glucose-Capillary: 97 mg/dL (ref 70–99)

## 2020-04-27 SURGERY — CARDIOVERSION
Anesthesia: General

## 2020-04-27 MED ORDER — SODIUM CHLORIDE 0.9 % IV SOLN: INTRAVENOUS | Status: DC

## 2020-04-27 MED ORDER — LIDOCAINE 2% (20 MG/ML) 5 ML SYRINGE
INTRAMUSCULAR | Status: DC | PRN
  Administered 2020-04-27: 60 mg via INTRAVENOUS

## 2020-04-27 MED ORDER — PROPOFOL 10 MG/ML IV BOLUS
INTRAVENOUS | Status: DC | PRN
  Administered 2020-04-27: 50 mg via INTRAVENOUS

## 2020-04-27 MED ORDER — APIXABAN 5 MG PO TABS
5.0000 mg | ORAL_TABLET | ORAL | Status: AC
  Administered 2020-04-27: 5 mg via ORAL
  Filled 2020-04-27: qty 1

## 2020-04-27 NOTE — Anesthesia Preprocedure Evaluation (Addendum)
Anesthesia Evaluation  Patient identified by MRN, date of birth, ID band Patient awake    Reviewed: Allergy & Precautions, NPO status , Patient's Chart, lab work & pertinent test results  Airway Mallampati: II  TM Distance: >3 FB Neck ROM: Full    Dental  (+) Edentulous Upper, Edentulous Lower   Pulmonary neg pulmonary ROS,    Pulmonary exam normal breath sounds clear to auscultation       Cardiovascular hypertension, Pt. on medications and Pt. on home beta blockers + dysrhythmias Atrial Fibrillation + Valvular Problems/Murmurs AI and MR  Rhythm:Irregular Rate:Normal  ECG: SR, rate 79  ECHO: 1. Definity used; normal LV systolic function; mild LVH; mildly dilated aortic root; mild AI; mild MR. 2. Left ventricular ejection fraction, by estimation, is 60 to 65%. The left ventricle has normal function. The left ventricle has no regional wall motion abnormalities. There is mild left ventricular hypertrophy. Left ventricular diastolic function could not be evaluated. 3. Right ventricular systolic function is normal. The right ventricular size is normal. There is mildly elevated pulmonary artery systolic pressure. 4. The mitral valve is normal in structure. Mild mitral valve regurgitation. No evidence of mitral stenosis. 5. The aortic valve is tricuspid. Aortic valve regurgitation is mild. No aortic stenosis is present. 6. Aortic dilatation noted. 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 7. The inferior vena cava is normal in size with greater than 50% respiratory variability, suggesting right atrial pressure of 3 mmHg.   Neuro/Psych negative neurological ROS  negative psych ROS   GI/Hepatic Neg liver ROS, GERD  Medicated and Controlled,  Endo/Other  diabetes, Oral Hypoglycemic Agents  Renal/GU negative Renal ROS     Musculoskeletal Gout   Abdominal (+) + obese,   Peds  Hematology  (+) anemia ,    Anesthesia Other Findings A-FIB  Reproductive/Obstetrics                            Anesthesia Physical Anesthesia Plan  ASA: III  Anesthesia Plan: General   Post-op Pain Management:    Induction: Intravenous  PONV Risk Score and Plan: 2 and Propofol infusion and Treatment may vary due to age or medical condition  Airway Management Planned: Mask  Additional Equipment:   Intra-op Plan:   Post-operative Plan:   Informed Consent: I have reviewed the patients History and Physical, chart, labs and discussed the procedure including the risks, benefits and alternatives for the proposed anesthesia with the patient or authorized representative who has indicated his/her understanding and acceptance.       Plan Discussed with: CRNA  Anesthesia Plan Comments:        Anesthesia Quick Evaluation

## 2020-04-27 NOTE — CV Procedure (Addendum)
    Cardioversion Note  ANA LIAW [id number] [birth year]  Procedure: DC Cardioversion Indications: atrial fibrillation  Procedure Details Consent: Obtained Time Out: Verified patient identification, verified procedure, site/side was marked, verified correct patient position, special equipment/implants available, Radiology Safety Procedures followed,  medications/allergies/relevent history reviewed, required imaging and test results available.  Performed  The patient has been on adequate anticoagulation.  The patient received IV propofol 50 mg and IV Lidocaine 60 mg administered by anesthesia staff for deep sedation.  Synchronous cardioversion was performed at 120 joules x 1.  The cardioversion was successful.   Complications: No apparent complications Patient did tolerate procedure well.   [name], MD, [facility] 04/27/2020, 9:48 AM

## 2020-04-27 NOTE — Transfer of Care (Signed)
Immediate Anesthesia Transfer of Care Note  Patient: Brandon Sherman  Procedure(s) Performed: CARDIOVERSION (N/A )  Patient Location: Endoscopy Unit  Anesthesia Type:General  Level of Consciousness: awake, alert  and oriented  Airway & Oxygen Therapy: Patient Spontanous Breathing and Patient connected to nasal cannula oxygen  Post-op Assessment: Report given to RN, Post -op Vital signs reviewed and stable and Patient moving all extremities  Post vital signs: Reviewed and stable  Last Vitals:  Vitals Value Taken Time  BP 156/84 04/27/20 0946  Temp    Pulse 78 04/27/20 0948  Resp 20 04/27/20 0948  SpO2 99 % 04/27/20 0948  Vitals shown include unvalidated device data.  Last Pain:  Vitals:   04/27/20 0820  TempSrc: Temporal  PainSc: 0-No pain         Complications: No apparent anesthesia complications

## 2020-04-27 NOTE — Anesthesia Postprocedure Evaluation (Signed)
Anesthesia Post Note  Patient: Brandon Sherman  Procedure(s) Performed: CARDIOVERSION (N/A )     Patient location during evaluation: Endoscopy Anesthesia Type: General Level of consciousness: awake Pain management: pain level controlled Vital Signs Assessment: post-procedure vital signs reviewed and stable Respiratory status: spontaneous breathing, nonlabored ventilation, respiratory function stable and patient connected to nasal cannula oxygen Cardiovascular status: blood pressure returned to baseline and stable Postop Assessment: no apparent nausea or vomiting Anesthetic complications: no    Last Vitals:  Vitals:   04/27/20 0947 04/27/20 1000  BP: (!) 156/84 (!) 176/89  Pulse: 77 84  Resp: (!) 26 17  Temp: 36.6 C   SpO2: 100% 98%    Last Pain:  Vitals:   04/27/20 1000  TempSrc:   PainSc: 0-No pain                 Khamarion Bjelland P [facility] [place]

## 2020-04-27 NOTE — Interval H&P Note (Signed)
History and Physical Interval Note:  04/27/2020 8:16 AM  Brandon Sherman  has presented today for surgery, with the diagnosis of A-FIB.  The various methods of treatment have been discussed with the patient and family. After consideration of risks, benefits and other options for treatment, the patient has consented to  Procedure(s): CARDIOVERSION (N/A) as a surgical intervention.  The patient's history has been reviewed, patient examined, no change in status, stable for surgery.  I have reviewed the patient's chart and labs.  Questions were answered to the patient's satisfaction.     [name]

## 2020-04-30 ENCOUNTER — Other Ambulatory Visit: Payer: Self-pay | Admitting: Hematology

## 2020-04-30 ENCOUNTER — Other Ambulatory Visit: Payer: Self-pay

## 2020-04-30 DIAGNOSIS — C221 Intrahepatic bile duct carcinoma: Secondary | ICD-10-CM

## 2020-05-03 ENCOUNTER — Other Ambulatory Visit: Payer: Self-pay

## 2020-05-03 ENCOUNTER — Encounter: Payer: Self-pay | Admitting: Hematology

## 2020-05-03 ENCOUNTER — Inpatient Hospital Stay (HOSPITAL_BASED_OUTPATIENT_CLINIC_OR_DEPARTMENT_OTHER): Payer: Medicare HMO | Admitting: Hematology

## 2020-05-03 ENCOUNTER — Inpatient Hospital Stay: Payer: Medicare HMO

## 2020-05-03 ENCOUNTER — Telehealth: Payer: Self-pay | Admitting: Hematology

## 2020-05-03 ENCOUNTER — Telehealth: Payer: Self-pay

## 2020-05-03 ENCOUNTER — Encounter (HOSPITAL_COMMUNITY): Payer: Self-pay | Admitting: *Deleted

## 2020-05-03 VITALS — BP 174/97 | HR 60 | Temp 98.1°F | Resp 18 | Wt 216.1 lb

## 2020-05-03 VITALS — BP 169/77

## 2020-05-03 DIAGNOSIS — C221 Intrahepatic bile duct carcinoma: Secondary | ICD-10-CM | POA: Diagnosis not present

## 2020-05-03 DIAGNOSIS — I482 Chronic atrial fibrillation, unspecified: Secondary | ICD-10-CM | POA: Diagnosis not present

## 2020-05-03 DIAGNOSIS — Z7189 Other specified counseling: Secondary | ICD-10-CM

## 2020-05-03 DIAGNOSIS — Z95828 Presence of other vascular implants and grafts: Secondary | ICD-10-CM

## 2020-05-03 DIAGNOSIS — Z5111 Encounter for antineoplastic chemotherapy: Secondary | ICD-10-CM | POA: Diagnosis not present

## 2020-05-03 LAB — CMP (CANCER CENTER ONLY)
ALT: 9 U/L (ref 0–44)
AST: 16 U/L (ref 15–41)
Albumin: 3.7 g/dL (ref 3.5–5.0)
Alkaline Phosphatase: 39 U/L (ref 38–126)
Anion gap: 11 (ref 5–15)
BUN: 17 mg/dL (ref 8–23)
CO2: 21 mmol/L — ABNORMAL LOW (ref 22–32)
Calcium: 9.5 mg/dL (ref 8.9–10.3)
Chloride: 109 mmol/L (ref 98–111)
Creatinine: 1.16 mg/dL (ref 0.61–1.24)
GFR, Est AFR Am: >60 mL/min (ref >60)
GFR, Estimated: >60 mL/min (ref >60)
Glucose, Bld: 90 mg/dL (ref 70–99)
Potassium: 3.9 mmol/L (ref 3.5–5.1)
Sodium: 141 mmol/L (ref 135–145)
Total Bilirubin: 0.8 mg/dL (ref 0.3–1.2)
Total Protein: 6.2 g/dL — ABNORMAL LOW (ref 6.5–8.1)

## 2020-05-03 LAB — CBC WITH DIFFERENTIAL (CANCER CENTER ONLY)
Abs Immature Granulocytes: 0.01 10*3/uL (ref 0.00–0.07)
Basophils Absolute: 0 10*3/uL (ref 0.0–0.1)
Basophils Relative: 1 %
Eosinophils Absolute: 0.1 10*3/uL (ref 0.0–0.5)
Eosinophils Relative: 2 %
HCT: 29 % — ABNORMAL LOW (ref 39.0–52.0)
Hemoglobin: 9.5 g/dL — ABNORMAL LOW (ref 13.0–17.0)
Immature Granulocytes: 0 %
Lymphocytes Relative: 22 %
Lymphs Abs: 0.9 10*3/uL (ref 0.7–4.0)
MCH: 35.2 pg — ABNORMAL HIGH (ref 26.0–34.0)
MCHC: 32.8 g/dL (ref 30.0–36.0)
MCV: 107.4 fL — ABNORMAL HIGH (ref 80.0–100.0)
Monocytes Absolute: 0.6 10*3/uL (ref 0.1–1.0)
Monocytes Relative: 15 %
Neutro Abs: 2.5 10*3/uL (ref 1.7–7.7)
Neutrophils Relative %: 60 %
Platelet Count: 203 10*3/uL (ref 150–400)
RBC: 2.7 MIL/uL — ABNORMAL LOW (ref 4.22–5.81)
RDW: 19.3 % — ABNORMAL HIGH (ref 11.5–15.5)
WBC Count: 4.2 10*3/uL (ref 4.0–10.5)
nRBC: 0 % (ref 0.0–0.2)

## 2020-05-03 MED ORDER — SODIUM CHLORIDE 0.9 % IV SOLN
Freq: Once | INTRAVENOUS | Status: AC
  Filled 2020-05-03: qty 250

## 2020-05-03 MED ORDER — PANTOPRAZOLE SODIUM 40 MG PO TBEC: 40.0000 mg | DELAYED_RELEASE_TABLET | Freq: Two times a day (BID) | ORAL | 0 refills | Status: DC

## 2020-05-03 MED ORDER — SODIUM CHLORIDE 0.9 % IV SOLN
1000.0000 mg/m2 | Freq: Once | INTRAVENOUS | Status: AC
  Administered 2020-05-03: 2166 mg via INTRAVENOUS
  Filled 2020-05-03: qty 56.97

## 2020-05-03 MED ORDER — AMLODIPINE BESYLATE 5 MG PO TABS
5.0000 mg | ORAL_TABLET | Freq: Every day | ORAL | 3 refills | Status: DC
Start: 2020-05-03 — End: 2020-05-11

## 2020-05-03 MED ORDER — SODIUM CHLORIDE 0.9% FLUSH
10.0000 mL | Freq: Once | INTRAVENOUS | Status: AC
  Administered 2020-05-03: 10 mL
  Filled 2020-05-03: qty 10

## 2020-05-03 MED ORDER — ONDANSETRON HCL 8 MG PO TABS: 8.0000 mg | ORAL_TABLET | Freq: Two times a day (BID) | ORAL | 1 refills | Status: DC | PRN

## 2020-05-03 MED ORDER — PROCHLORPERAZINE MALEATE 10 MG PO TABS
10.0000 mg | ORAL_TABLET | Freq: Four times a day (QID) | ORAL | Status: DC | PRN
  Administered 2020-05-03: 10 mg via ORAL

## 2020-05-03 MED ORDER — HEPARIN SOD (PORK) LOCK FLUSH 100 UNIT/ML IV SOLN
500.0000 [IU] | Freq: Once | INTRAVENOUS | Status: AC | PRN
  Administered 2020-05-03: 500 [IU]
  Filled 2020-05-03: qty 5

## 2020-05-03 MED ORDER — PROCHLORPERAZINE MALEATE 10 MG PO TABS
ORAL_TABLET | ORAL | Status: AC
  Filled 2020-05-03: qty 1

## 2020-05-03 MED ORDER — SODIUM CHLORIDE 0.9% FLUSH
10.0000 mL | INTRAVENOUS | Status: DC | PRN
  Administered 2020-05-03: 10 mL
  Filled 2020-05-03: qty 10

## 2020-05-03 NOTE — Patient Instructions (Signed)
[facility] °Discharge Instructions for Patients Receiving Chemotherapy ° °Today you received the following chemotherapy agents Gemzar ° °To help prevent nausea and vomiting after your treatment, we encourage you to take your nausea medication as directed. °  °If you develop nausea and vomiting that is not controlled by your nausea medication, call the clinic.  ° °BELOW ARE SYMPTOMS THAT SHOULD BE REPORTED IMMEDIATELY: °· *FEVER GREATER THAN 100.5 F °· *CHILLS WITH OR WITHOUT FEVER °· NAUSEA AND VOMITING THAT IS NOT CONTROLLED WITH YOUR NAUSEA MEDICATION °· *UNUSUAL SHORTNESS OF BREATH °· *UNUSUAL BRUISING OR BLEEDING °· TENDERNESS IN MOUTH AND THROAT WITH OR WITHOUT PRESENCE OF ULCERS °· *URINARY PROBLEMS °· *BOWEL PROBLEMS °· UNUSUAL RASH °Items with * indicate a potential emergency and should be followed up as soon as possible. ° °Feel free to call the clinic should you have any questions or concerns. The clinic phone number is [phone]. ° °Please show the CHEMO ALERT CARD at check-in to the Emergency Department and triage nurse. ° ° °

## 2020-05-03 NOTE — Telephone Encounter (Signed)
Spoke to patient's wife.Dr.Berry advised to increase Amlodipine to 5 mg daily.Advised no extender appointments available this week for a B/P check.Wife will check B/P daily and bring readings to appointment with Dr.Berry 6/22 at 10:00 am.

## 2020-05-03 NOTE — Telephone Encounter (Signed)
Scheduled appt per 6/14 los - gave patient AVS and calender per los,.

## 2020-05-03 NOTE — Progress Notes (Signed)
Brandon Sherman   [phone] [phone]   Clinic Follow up Note   Patient Care Team: [name], PA as PCP - General (Family Medicine) [name], MD as Consulting Physician (General Surgery) [name], [name], MD as Consulting Physician (Gastroenterology) [name], MD as Consulting Physician (Hematology)  Date of Service:  05/03/2020  CHIEF COMPLAINT: F/u cholangiocarcinoma  SUMMARY OF ONCOLOGIC HISTORY: Oncology History Overview Note  Cancer Staging Intrahepatic cholangiocarcinoma ([place]) Staging form: Intrahepatic Bile Duct, AJCC 8th Edition - Clinical stage from 08/19/2019: Stage II (cT2, cN0, cM0) - Signed by [name], MD on 08/19/2019    Intrahepatic cholangiocarcinoma ([place])  06/28/2019 Imaging   CT AP W Contrast 06/28/19  IMPRESSION: 1. Heterogeneous hypodensity posteriorly in the right hepatic lobe and potentially extending into the caudate lobe suspicious for a mass. There is felt to be truncation of branches of the portal vein in this vicinity and some narrowing of the hepatic vein, as well as triangular-shaped regions of abnormal hypoenhancement posteriorly in the right hepatic lobe likely representing downstream vascular effects. Cannot exclude malignancy such as cholangiocarcinoma or hepatocellular carcinoma, and follow up hepatic protocol MRI with and without contrast is recommended to further characterize. 2. 4 mm right middle lobe pulmonary nodule is likely benign but may merit surveillance. 3. Cholelithiasis. 4.  Aortic Atherosclerosis (ICD10-I70.0). 5. Prostatomegaly. 6. Mild impingement at L3-4 and L4-5.   07/31/2019 Imaging   MRI Liver 07/31/19 IMPRESSION: 1. 7.3 cm in long axis mass in the right hepatic lobe spanning into the caudate lobe, high suspicion for malignancy such as hepatocellular carcinoma or cholangiocarcinoma. Suspected effacement or occlusion of the right hepatic vein and posterior branches of  the right portal vein. Two smaller tumor nodules along the posterior periphery of the dominant mass. Tissue diagnosis is recommended. 2. No findings of pathologic adenopathy or distant metastatic spread. 3. 9 mm gallstone in the gallbladder. There is mild gallbladder wall thickening which may be from nondistention, correlate clinically in assessing for cholecystitis. 4.  Aortic Atherosclerosis (ICD10-I70.0). 5. Mild diffuse hepatic steatosis.   08/11/2019 Initial Biopsy   DIAGNOSIS: 08/11/19  A. LIVER, RIGHT, BIOPSY:  - Adenocarcinoma.   08/18/2019 Imaging   CT Chest 08/18/19  IMPRESSION: 1. Multiple pulmonary nodules largest at approximately 7 mm in the right lower lobe, nonspecific but concerning given findings in the liver. 2. No signs of definitive metastatic disease, also with mildly enlarged upper abdominal lymph nodes as discussed.   Aortic Atherosclerosis (ICD10-I70.0).   08/19/2019 Initial Diagnosis   Intrahepatic cholangiocarcinoma ([place])   08/19/2019 Cancer Staging   Staging form: Intrahepatic Bile Duct, AJCC 8th Edition - Clinical stage from 08/19/2019: Stage II (cT2, cN0, cM0) - Signed by [name], MD on 08/19/2019   09/29/2019 -  Chemotherapy   Cisplatin and Gemcitabine 2 weeks on/1 week off starting 09/29/19. Cisplatin held from cycle 9 (03/23/20) due to fluid status/Afib. He is now on maintenance Gemcitabine.     10/09/2019 Imaging   CT AP IMPRESSION: 1. The dominant right hepatic lobe mass is minimally reduced in size compared to prior exams, currently measuring 6.2 by 5.3 cm, previously 6.2 by 5.6 cm. However, there is a new small hypodense lesion centrally in the right hepatic lobe which is suspicious for a new small focus of tumor. Accordingly this is an overall mixed appearance. 2. Continued hypoenhancement in the liver downstream of the tumor likely attributable to narrowing or occlusion of the right hepatic vein by the tumor. By virtue of  its location the tumor  wraps around the intrahepatic portion of the IVC. 3. 4 mm right middle lobe pulmonary nodule, stable compared to earliest available comparison of 07/18/2019. Surveillance of the patient's pulmonary nodules is recommended. 4. Other imaging findings of potential clinical significance: Coronary atherosclerosis. Cholelithiasis. Prominent stool throughout the colon favors constipation. Moderate prostatomegaly with heterogeneous enhancement of the prostate gland. Lumbar spondylosis and degenerative disc disease causing mild bilateral foraminal impingement at L3-4 and L4-5.   Aortic Atherosclerosis (ICD10-I70.0).   12/24/2019 Imaging   CT CAP W Contrast  IMPRESSION: 1. Right hepatic lobe mass and adjacent right hepatic lobe nodules appear grossly stable. No evidence of distant metastatic disease. 2. Continued stability of small pulmonary nodules. Recommend attention on follow-up. 3. Cholelithiasis. 4. Enlarged prostate. 5. Aortic atherosclerosis (ICD10-I70.0). Coronary artery calcification.   04/08/2020 Imaging   CT CAP  IMPRESSION: 1. Stable or minimally decreased size of a very ill-defined, hypodense and somewhat retractile appearing mass of the central right lobe of the liver abutting the inferior vena cava and right portal vein, measuring approximately 5.7 x 5.0 cm, previously 6.2 x 5.2 cm when measured similarly. Findings are consistent with stable or minimally improved cholangiocarcinoma. 2. Small hypodense nodules of the right lobe identified on prior examination are poorly appreciated on this single phase contrast examination although not grossly changed. Attention on follow-up. 3. There are new, moderate bilateral pleural effusions and associated atelectasis or consolidation as well as a new small pericardial effusion, nonspecific although generally concerning and suspicious for malignant effusions. There is no directly visualized pleural nodularity. 4. Multiple small pulmonary  nodules are stable.  No new nodules. 5. Coronary artery disease. Aortic Atherosclerosis (ICD10-I70.0).      CURRENT THERAPY:  Cisplatin and Gemcitabine 2 weeks on/1 week off starting 09/29/19. Cisplatin held from cycle 9 (03/23/20) due to fluid status/Afib. He is now on maintenance Gemcitabine.   INTERVAL HISTORY:  Brandon Sherman is here for a follow up and treatment. He presents to the clinic with his wife. He notes he is doing well. He notes he is doing well since Cardioversion, but still gets fatigue. He notes he can walk better now. He only needs a short break while waling and being active. He notes his BP has been elevated at home. He notes he is on Lasix every other day. He notes swelling of LE. He plans to f/u with Cardiologist next week.  He is tolerating chemo well. He denies N&V and able to eat adequately. He denies stomach issues, bloating or chest or breast issues.     REVIEW OF SYSTEMS:   Constitutional: Denies fevers, chills or abnormal weight loss (+) Fatigue  Eyes: Denies blurriness of vision Ears, nose, mouth, throat, and face: Denies mucositis or sore throat Respiratory: Denies cough, dyspnea or wheezes Cardiovascular: Denies palpitation, chest discomfort (+) lower extremity swelling Gastrointestinal:  Denies nausea, heartburn or change in bowel habits Skin: Denies abnormal skin rashes Lymphatics: Denies new lymphadenopathy or easy bruising Neurological:Denies numbness, tingling or new weaknesses Behavioral/Psych: Mood is stable, no new changes  All other systems were reviewed with the patient and are negative.  MEDICAL HISTORY:  Past Medical History:  Diagnosis Date  . Arthritis   . Diabetes ([place])   . GERD (gastroesophageal reflux disease)   . Hyperlipidemia   . Hypertension   . Intrahepatic cholangiocarcinoma ([place])     SURGICAL HISTORY: Past Surgical History:  Procedure Laterality Date  . APPENDECTOMY  1980  . CARDIOVERSION N/A 04/27/2020  Procedure:  CARDIOVERSION;  Surgeon: Dorothy Spark, MD;  Location: [facility] ENDOSCOPY;  Service: Cardiovascular;  Laterality: N/A;  . COLONOSCOPY    . IR IMAGING GUIDED PORT INSERTION  02/23/2020    I have reviewed the social history and family history with the patient and they are unchanged from previous note.  ALLERGIES:  has No Known Allergies.  MEDICATIONS:  Current Outpatient Medications  Medication Sig Dispense Refill  . allopurinol (ZYLOPRIM) 100 MG tablet Take 100 mg by mouth daily.    Marland Kitchen amLODipine (NORVASC) 2.5 MG tablet Take 2.5 mg by mouth daily.    Marland Kitchen apixaban (ELIQUIS) 5 MG TABS tablet Take 1 tablet (5 mg total) by mouth 2 (two) times daily. 180 tablet 3  . fenofibrate (TRICOR) 145 MG tablet Take 145 mg by mouth daily.    . finasteride (PROSCAR) 5 MG tablet Take 5 mg by mouth daily.    . furosemide (LASIX) 20 MG tablet Take 1 tablet (20 mg total) by mouth every other day. 45 tablet 3  . lidocaine-prilocaine (EMLA) cream Apply 1 application topically as needed. 30 g 3  . lisinopril (ZESTRIL) 40 MG tablet Take 40 mg by mouth daily.    . magnesium oxide (MAG-OX) 400 MG tablet TAKE 1 TABLET BY MOUTH EVERY DAY (Patient taking differently: Take 400 mg by mouth in the morning, at noon, and at bedtime. ) 30 tablet 0  . metFORMIN (GLUCOPHAGE) 500 MG tablet Take 500 mg by mouth 2 (two) times daily with a meal.     . metoprolol succinate (TOPROL-XL) 25 MG 24 hr tablet Take 1 tablet (25 mg total) by mouth daily. 30 tablet 6  . ondansetron (ZOFRAN) 8 MG tablet Take 1 tablet (8 mg total) by mouth 2 (two) times daily as needed. Start on the third day after chemotherapy. 30 tablet 1  . pantoprazole (PROTONIX) 40 MG tablet Take 1 tablet (40 mg total) by mouth 2 (two) times daily. 180 tablet 0  . pravastatin (PRAVACHOL) 40 MG tablet Take 40 mg by mouth daily.    . sucralfate (CARAFATE) 1 g tablet TAKE 1 TABLET BY MOUTH EVERY 6 HOURS AS NEEDED. (Patient taking differently: Take 1 g by mouth every 6 (six) hours  as needed (stomach). ) 180 tablet 0  . tamsulosin (FLOMAX) 0.4 MG CAPS capsule Take 0.4 mg by mouth daily.    . Vitamin D, Ergocalciferol, (DRISDOL) 1.25 MG (50000 UT) CAPS capsule Take 50,000 Units by mouth every 7 (seven) days.     No current facility-administered medications for this visit.   Facility-Administered Medications Ordered in Other Visits  Medication Dose Route Frequency Provider Last Rate Last Admin  . gemcitabine (GEMZAR) 2,166 mg in sodium chloride 0.9 % 250 mL chemo infusion  1,000 mg/m2 (Treatment Plan Recorded) Intravenous Once [name], MD      . heparin lock flush 100 unit/mL  500 Units Intracatheter Once PRN [name], MD      . prochlorperazine (COMPAZINE) tablet 10 mg  10 mg Oral Q6H PRN [name], MD   10 mg at 05/03/20 1119  . sodium chloride flush (NS) 0.9 % injection 10 mL  10 mL Intracatheter PRN [name], MD        PHYSICAL EXAMINATION: ECOG PERFORMANCE STATUS: 2 - Symptomatic, <50% confined to bed  Vitals:   05/03/20 0958 05/03/20 1000  BP: (!) 184/97 (!) 174/97  Pulse: 60   Resp: 18   Temp: 98.1 F (36.7 C)   SpO2: 100%  Filed Weights   05/03/20 0958  Weight: 216 lb 1.6 oz (98 kg)    Due to COVID19 we will limit examination to appearance. Patient had no complaints.  GENERAL:alert, no distress and comfortable SKIN: skin color normal, no rashes or significant lesions EYES: normal, Conjunctiva are pink and non-injected, sclera clear  NEURO: alert & oriented x 3 with fluent speech (+) lower extremity edema   LABORATORY DATA:  I have reviewed the data as listed CBC Latest Ref Rng & Units 05/03/2020 04/20/2020 04/12/2020  WBC 4.0 - 10.5 K/uL 4.2 6.3 17.5(H)  Hemoglobin 13.0 - 17.0 g/dL 9.5(L) 9.0(L) 9.5(L)  Hematocrit 39 - 52 % 29.0(L) 27.6(L) 29.6(L)  Platelets 150 - 400 K/uL 203 173 205     CMP Latest Ref Rng & Units 05/03/2020 04/20/2020 04/12/2020  Glucose 70 - 99 mg/dL 90 100(H) 93  BUN 8 - 23 mg/dL 17 14 18   Creatinine 0.61 - 1.24 mg/dL  1.16 1.32(H) 1.20  Sodium 135 - 145 mmol/L 141 141 143  Potassium 3.5 - 5.1 mmol/L 3.9 3.6 3.6  Chloride 98 - 111 mmol/L 109 109 107  CO2 22 - 32 mmol/L 21(L) 21(L) 25  Calcium 8.9 - 10.3 mg/dL 9.5 9.4 9.4  Total Protein 6.5 - 8.1 g/dL 6.2(L) 6.3(L) 6.4(L)  Total Bilirubin 0.3 - 1.2 mg/dL 0.8 0.5 0.6  Alkaline Phos 38 - 126 U/L 39 65 81  AST 15 - 41 U/L 16 20 16   ALT 0 - 44 U/L 9 13 10       RADIOGRAPHIC STUDIES: I have personally reviewed the radiological images as listed and agreed with the findings in the report. No results found.   ASSESSMENT & PLAN:  Brandon Sherman is a 74 y.o. male with    1.Intrahepatic cholangiocarcinoma, cT2N0Mx,unresectable,with indeterminate lung nodules -He was diagnosed in 07/2019.CT scans and MRI liver showa large7.3cm mass in the right hepatic lobe whichabutsportal vein.  -He was seen byour local surgeon Dr. Barry Dienes andDr Carlis Abbott at [facility] concluded that cancer is not resectable due to the invasion to portal vein. -I discussed given his cancer is non-resectable his cancer is likely not curable but still treatable. Istarted him onstandardfirst line chemo with IV Cisplatin and Gemcitabine 2 weeks on/1 weekoff beginning 09/29/19.Cisplatin was held since C9 (03/23/20) due to fluid status/AFib. He is now on maintenance Gemcitabine alone.  -His FOresults showed MSI stable disease, IDH 1 mutation,so he may benefit from IDH inhibitor in the future. -HehadPAC placedon 02/23/20. His 04/08/20 CT CAP showed stable disease in liver, no other new lesions.  -He did clinically improved after recent cardioversion. He is tolerating single agent Gemcitabine well.  -Labs reviewed, CBC and CMP WNL except Hg 9.5. Overall adequate to proceed with C11D1 Gemcitabine today.  -F/u in 3 weeks    2. Nausea, Low food intakeand weight loss, Epigastric pain -With pain from GERD and nausea he initially lost 30 pounds. -His nausea and GERD is based on what he  eats. Controlled onPhenergan, Sucralfate and Pepcid. -Hewaspreviouslyseen by Dietician. -For his mild constipation he can continue OTC stool softeners. He has Zofran for nausea.  -He notes intermittent epigastric pain (S/p C7). This improves with Sucralfate. He will also continue antiacid. -With single agent Gemcitabine he has no N&V and eating well. Weight has been fluctuating on Lasix.   3. DM, HTN, HLD, Gout, LE edema -On Metformin, amlodipine, lisinopril., allopurinol.Continue to f/u with his PCP  -He does have LE edema which he attributes to Gout. Ipreviouslyaddedlow dose IV  lasix to infusions given crackling of lungs on 3/1/21exam. -BP at 174/97 today (05/03/20). He notes his systolic BP has been elevated at home as well which he attributes to his medications. He is on Lasix, will watch Cr.   4. New onset AF -With PAC placement on 02/23/20 he was seen to have Afib. Based on [place]. He is at moderate risk for stroke. -He has Afib and mild tachycardia on 03/01/20 exam. He is now being seen by Cardiologist, on Eliquis, Lasix, Toprolol.  -His CT scan from 04/08/20 shows b/l pleural effusion.  -He notes leg weakness and SOB. He underwent Cardioversion with Dr. Meda Coffee on 04/27/20. His weakness has much improved.   5. Nicotine Use -He never smoked but has been chewing Tobacco for the past 60 years.He no longer drinks alcohol. -He now chews 1-2 times a day.Iagainencouraged him toreduce and quit completely.  6.GERDand gastritis, history ofesophageal candidiasis -Hehad repeated EGDwith Dr. Havery Moros in 07/2019. 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  -Will monitor andmayrepeat EGDin future   PLAN: -I refilled Zofran and Protonix today  -Labs reviewed and adequate to proceed with C11D1 Gemcitabine today -Lab, flush andchemo Gemcitabine in 1, 3, 4 weeks  -F/u  in 3 weeks -Copy note to Cardiologist Dr. Gwenlyn Found about poorly controlled hypertension    No problem-specific Assessment & Plan notes found for this encounter.   No orders of the defined types were placed in this encounter.  All questions were answered. The patient knows to call the clinic with any problems, questions or concerns. No barriers to learning was detected. The total time spent in the appointment was 30 minutes.     [name], MD 05/03/2020   I, Joslyn Devon, am acting as scribe for [name], MD.   I have reviewed the above documentation for accuracy and completeness, and I agree with the above.

## 2020-05-05 ENCOUNTER — Ambulatory Visit (HOSPITAL_COMMUNITY): Payer: Medicare HMO | Attending: Cardiology

## 2020-05-05 ENCOUNTER — Other Ambulatory Visit: Payer: Self-pay

## 2020-05-05 DIAGNOSIS — R0602 Shortness of breath: Secondary | ICD-10-CM | POA: Insufficient documentation

## 2020-05-05 LAB — MYOCARDIAL PERFUSION IMAGING
LV dias vol: 97 mL (ref 62–150)
LV sys vol: 36 mL
Peak HR: 114 {beats}/min
Rest HR: 106 {beats}/min
SDS: 2
SRS: 0
SSS: 2
TID: 1.01

## 2020-05-05 MED ORDER — TECHNETIUM TC 99M TETROFOSMIN IV KIT
11.0000 | PACK | Freq: Once | INTRAVENOUS | Status: AC | PRN
  Administered 2020-05-05: 11 via INTRAVENOUS
  Filled 2020-05-05: qty 11

## 2020-05-05 MED ORDER — TECHNETIUM TC 99M TETROFOSMIN IV KIT
32.5000 | PACK | Freq: Once | INTRAVENOUS | Status: AC | PRN
  Administered 2020-05-05: 32.5 via INTRAVENOUS
  Filled 2020-05-05: qty 33

## 2020-05-05 MED ORDER — REGADENOSON 0.4 MG/5ML IV SOLN
0.4000 mg | Freq: Once | INTRAVENOUS | Status: AC
  Administered 2020-05-05: 0.4 mg via INTRAVENOUS

## 2020-05-06 ENCOUNTER — Other Ambulatory Visit: Payer: Self-pay | Admitting: Gastroenterology

## 2020-05-08 ENCOUNTER — Other Ambulatory Visit: Payer: Self-pay | Admitting: Nurse Practitioner

## 2020-05-10 ENCOUNTER — Inpatient Hospital Stay: Payer: Medicare HMO

## 2020-05-10 ENCOUNTER — Other Ambulatory Visit: Payer: Self-pay

## 2020-05-10 VITALS — BP 162/80 | HR 79 | Temp 98.4°F | Resp 18 | Wt 218.8 lb

## 2020-05-10 DIAGNOSIS — Z5111 Encounter for antineoplastic chemotherapy: Secondary | ICD-10-CM | POA: Diagnosis not present

## 2020-05-10 DIAGNOSIS — C221 Intrahepatic bile duct carcinoma: Secondary | ICD-10-CM | POA: Diagnosis not present

## 2020-05-10 DIAGNOSIS — Z95828 Presence of other vascular implants and grafts: Secondary | ICD-10-CM

## 2020-05-10 DIAGNOSIS — Z7189 Other specified counseling: Secondary | ICD-10-CM

## 2020-05-10 LAB — CBC WITH DIFFERENTIAL (CANCER CENTER ONLY)
Abs Immature Granulocytes: 0.02 10*3/uL (ref 0.00–0.07)
Basophils Absolute: 0.1 10*3/uL (ref 0.0–0.1)
Basophils Relative: 2 %
Eosinophils Absolute: 0 10*3/uL (ref 0.0–0.5)
Eosinophils Relative: 1 %
HCT: 26.8 % — ABNORMAL LOW (ref 39.0–52.0)
Hemoglobin: 9.1 g/dL — ABNORMAL LOW (ref 13.0–17.0)
Immature Granulocytes: 1 %
Lymphocytes Relative: 29 %
Lymphs Abs: 0.9 10*3/uL (ref 0.7–4.0)
MCH: 36.7 pg — ABNORMAL HIGH (ref 26.0–34.0)
MCHC: 34 g/dL (ref 30.0–36.0)
MCV: 108.1 fL — ABNORMAL HIGH (ref 80.0–100.0)
Monocytes Absolute: 0.5 10*3/uL (ref 0.1–1.0)
Monocytes Relative: 17 %
Neutro Abs: 1.6 10*3/uL — ABNORMAL LOW (ref 1.7–7.7)
Neutrophils Relative %: 50 %
Platelet Count: 160 10*3/uL (ref 150–400)
RBC: 2.48 MIL/uL — ABNORMAL LOW (ref 4.22–5.81)
RDW: 17.3 % — ABNORMAL HIGH (ref 11.5–15.5)
WBC Count: 3.2 10*3/uL — ABNORMAL LOW (ref 4.0–10.5)
nRBC: 0.9 % — ABNORMAL HIGH (ref 0.0–0.2)

## 2020-05-10 LAB — CMP (CANCER CENTER ONLY)
ALT: 13 U/L (ref 0–44)
AST: 21 U/L (ref 15–41)
Albumin: 3.5 g/dL (ref 3.5–5.0)
Alkaline Phosphatase: 50 U/L (ref 38–126)
Anion gap: 10 (ref 5–15)
BUN: 15 mg/dL (ref 8–23)
CO2: 23 mmol/L (ref 22–32)
Calcium: 9.4 mg/dL (ref 8.9–10.3)
Chloride: 109 mmol/L (ref 98–111)
Creatinine: 1.18 mg/dL (ref 0.61–1.24)
GFR, Est AFR Am: >60 mL/min (ref >60)
GFR, Estimated: >60 mL/min (ref >60)
Glucose, Bld: 100 mg/dL — ABNORMAL HIGH (ref 70–99)
Potassium: 3.8 mmol/L (ref 3.5–5.1)
Sodium: 142 mmol/L (ref 135–145)
Total Bilirubin: 0.8 mg/dL (ref 0.3–1.2)
Total Protein: 6.1 g/dL — ABNORMAL LOW (ref 6.5–8.1)

## 2020-05-10 MED ORDER — PROCHLORPERAZINE MALEATE 10 MG PO TABS
ORAL_TABLET | ORAL | Status: AC
  Filled 2020-05-10: qty 1

## 2020-05-10 MED ORDER — HEPARIN SOD (PORK) LOCK FLUSH 100 UNIT/ML IV SOLN
500.0000 [IU] | Freq: Once | INTRAVENOUS | Status: AC | PRN
  Administered 2020-05-10: 500 [IU]
  Filled 2020-05-10: qty 5

## 2020-05-10 MED ORDER — SODIUM CHLORIDE 0.9 % IV SOLN
Freq: Once | INTRAVENOUS | Status: AC
  Filled 2020-05-10: qty 250

## 2020-05-10 MED ORDER — PROCHLORPERAZINE MALEATE 10 MG PO TABS
10.0000 mg | ORAL_TABLET | Freq: Once | ORAL | Status: AC
  Administered 2020-05-10: 10 mg via ORAL

## 2020-05-10 MED ORDER — SODIUM CHLORIDE 0.9% FLUSH
10.0000 mL | Freq: Once | INTRAVENOUS | Status: AC
  Administered 2020-05-10: 10 mL
  Filled 2020-05-10: qty 10

## 2020-05-10 MED ORDER — SODIUM CHLORIDE 0.9% FLUSH
10.0000 mL | INTRAVENOUS | Status: DC | PRN
  Administered 2020-05-10: 10 mL
  Filled 2020-05-10: qty 10

## 2020-05-10 MED ORDER — SODIUM CHLORIDE 0.9 % IV SOLN
1000.0000 mg/m2 | Freq: Once | INTRAVENOUS | Status: AC
  Administered 2020-05-10: 2166 mg via INTRAVENOUS
  Filled 2020-05-10: qty 56.97

## 2020-05-10 NOTE — Patient Instructions (Signed)
[facility] Discharge Instructions for Patients Receiving Chemotherapy  Today you received the following chemotherapy agents Gemcitabine (GEMZAR).  To help prevent nausea and vomiting after your treatment, we encourage you to take your nausea medication as prescribed.   If you develop nausea and vomiting that is not controlled by your nausea medication, call the clinic.   BELOW ARE SYMPTOMS THAT SHOULD BE REPORTED IMMEDIATELY:  *FEVER GREATER THAN 100.5 F  *CHILLS WITH OR WITHOUT FEVER  NAUSEA AND VOMITING THAT IS NOT CONTROLLED WITH YOUR NAUSEA MEDICATION  *UNUSUAL SHORTNESS OF BREATH  *UNUSUAL BRUISING OR BLEEDING  TENDERNESS IN MOUTH AND THROAT WITH OR WITHOUT PRESENCE OF ULCERS  *URINARY PROBLEMS  *BOWEL PROBLEMS  UNUSUAL RASH Items with * indicate a potential emergency and should be followed up as soon as possible.  Feel free to call the clinic should you have any questions or concerns. The clinic phone number is [phone].  Please show the CHEMO ALERT CARD at check-in to the Emergency Department and triage nurse.   

## 2020-05-11 ENCOUNTER — Encounter: Payer: Self-pay | Admitting: Cardiovascular Disease

## 2020-05-11 ENCOUNTER — Ambulatory Visit (INDEPENDENT_AMBULATORY_CARE_PROVIDER_SITE_OTHER): Payer: Medicare HMO | Admitting: Cardiovascular Disease

## 2020-05-11 VITALS — BP 138/74 | HR 105 | Ht 71.0 in | Wt 220.6 lb

## 2020-05-11 DIAGNOSIS — I2510 Atherosclerotic heart disease of native coronary artery without angina pectoris: Secondary | ICD-10-CM

## 2020-05-11 DIAGNOSIS — I482 Chronic atrial fibrillation, unspecified: Secondary | ICD-10-CM

## 2020-05-11 MED ORDER — OLMESARTAN MEDOXOMIL 40 MG PO TABS: 80.0000 mg | ORAL_TABLET | Freq: Every day | ORAL | 3 refills | Status: DC

## 2020-05-11 MED ORDER — DILTIAZEM HCL ER COATED BEADS 180 MG PO CP24
180.0000 mg | ORAL_CAPSULE | Freq: Every day | ORAL | 3 refills | Status: DC
Start: 2020-05-11 — End: 2020-08-24

## 2020-05-11 MED ORDER — LISINOPRIL 40 MG PO TABS: 40.0000 mg | ORAL_TABLET | Freq: Every day | ORAL | 3 refills | Status: DC

## 2020-05-11 NOTE — Progress Notes (Signed)
05/11/2020 Brandon Sherman   [birth year]  [id number]  Primary Physician [name], PA Primary Cardiologist: Lorretta Harp MD [name], [place]  HPI:  Brandon Sherman is a 74 y.o.  moderately overweight married Caucasian male father of 68, grandfather of 5 grandchildren who is retired from working at [employer] for 52 years .  His wife [name] is also a patient of mine.  He was referred by Ihor Dow, PA for atrial fibrillation.  I last saw him in the office 03/24/2020. His risk factors include treated hypertension, diabetes and hyperlipidemia.  Both his mother and sister had known CAD.  He is never had a heart attack or stroke.  He unfortunately was diagnosed with liver cancer back in September of last year and is begun on chemotherapy since that time.  A Port-A-Cath was placed 2 months ago which time he was noted to be in A. fib and he was started on Eliquis 3 weeks ago by his PCP.  He has complained of increased dyspnea on exertion over the last 6 months but denies chest pain.  He does not have history of heavy alcohol abuse which was discontinued at the time of his liver cancer diagnosis.  After being on appropriate doses of Eliquis for at least 4 weeks he underwent successful outpatient cardioversion by Dr. Meda Coffee 04/27/2020.  He apparently went back in A. fib a week later.  Does complain of some dyspnea on exertion.  He has normal LV function by 2D echo and a negative Myoview stress test.  He does have hypertension with blood pressures in the 170 range.   Current Meds  Medication Sig  . allopurinol (ZYLOPRIM) 100 MG tablet Take 100 mg by mouth daily.  Marland Kitchen apixaban (ELIQUIS) 5 MG TABS tablet Take 1 tablet (5 mg total) by mouth 2 (two) times daily.  . cefdinir (OMNICEF) 300 MG capsule   . fenofibrate (TRICOR) 145 MG tablet Take 145 mg by mouth daily.  . finasteride (PROSCAR) 5 MG tablet Take 5 mg by mouth daily.  . furosemide (LASIX) 20 MG tablet Take 1 tablet (20 mg total) by  mouth every other day.  . lidocaine-prilocaine (EMLA) cream Apply 1 application topically as needed.  . magnesium oxide (MAG-OX) 400 MG tablet TAKE 1 TABLET BY MOUTH EVERY DAY  . metFORMIN (GLUCOPHAGE) 500 MG tablet Take 500 mg by mouth 2 (two) times daily with a meal.   . metoprolol succinate (TOPROL-XL) 25 MG 24 hr tablet Take 1 tablet (25 mg total) by mouth daily.  . ondansetron (ZOFRAN) 8 MG tablet Take 1 tablet (8 mg total) by mouth 2 (two) times daily as needed. Start on the third day after chemotherapy.  . pantoprazole (PROTONIX) 40 MG tablet Take 1 tablet (40 mg total) by mouth 2 (two) times daily.  . pravastatin (PRAVACHOL) 40 MG tablet Take 40 mg by mouth daily.  . sucralfate (CARAFATE) 1 g tablet TAKE 1 TABLET BY MOUTH EVERY 6 HOURS AS NEEDED.  Marland Kitchen tamsulosin (FLOMAX) 0.4 MG CAPS capsule Take 0.4 mg by mouth daily.  . Vitamin D, Ergocalciferol, (DRISDOL) 1.25 MG (50000 UT) CAPS capsule Take 50,000 Units by mouth every 7 (seven) days.  . [DISCONTINUED] amLODipine (NORVASC) 5 MG tablet Take 1 tablet (5 mg total) by mouth daily.  . [DISCONTINUED] doxycycline (VIBRA-TABS) 100 MG tablet   . [DISCONTINUED] lisinopril (ZESTRIL) 40 MG tablet Take 40 mg by mouth daily.     No Known Allergies  Social  History   Socioeconomic History  . Marital status: Married    Spouse name: Not on file  . Number of children: 3  . Years of education: Not on file  . Highest education level: Not on file  Occupational History  . Not on file  Tobacco Use  . Smoking status: Never Smoker  . Smokeless tobacco: Current User    Types: Chew  . Tobacco comment: for 60 years, 1-2 daily   Vaping Use  . Vaping Use: Never used  Substance and Sexual Activity  . Alcohol use: Not Currently    Comment: occasionally  . Drug use: Never  . Sexual activity: Not on file  Other Topics Concern  . Not on file  Social History Narrative  . Not on file   Social Determinants of Health   Financial Resource Strain:   .  Difficulty of Paying Living Expenses:   Food Insecurity:   . Worried About Charity fundraiser in the Last Year:   . Arboriculturist in the Last Year:   Transportation Needs:   . Film/video editor (Medical):   Marland Kitchen Lack of Transportation (Non-Medical):   Physical Activity:   . Days of Exercise per Week:   . Minutes of Exercise per Session:   Stress:   . Feeling of Stress :   Social Connections:   . Frequency of Communication with Friends and Family:   . Frequency of Social Gatherings with Friends and Family:   . Attends Religious Services:   . Active Member of Clubs or Organizations:   . Attends Archivist Meetings:   Marland Kitchen Marital Status:   Intimate Partner Violence:   . Fear of Current or Ex-Partner:   . Emotionally Abused:   Marland Kitchen Physically Abused:   . Sexually Abused:      Review of Systems: General: negative for chills, fever, night sweats or weight changes.  Cardiovascular: negative for chest pain, dyspnea on exertion, edema, orthopnea, palpitations, paroxysmal nocturnal dyspnea or shortness of breath Dermatological: negative for rash Respiratory: negative for cough or wheezing Urologic: negative for hematuria Abdominal: negative for nausea, vomiting, diarrhea, bright red blood per rectum, melena, or hematemesis Neurologic: negative for visual changes, syncope, or dizziness All other systems reviewed and are otherwise negative except as noted above.    Blood pressure (!) 170/90, pulse (!) 105, height 5\' 11"  (1.803 m), weight 220 lb 9.6 oz (100.1 kg), SpO2 96 %.  General appearance: alert and no distress Neck: no adenopathy, no carotid bruit, no JVD, supple, symmetrical, trachea midline and thyroid not enlarged, symmetric, no tenderness/mass/nodules Lungs: clear to auscultation bilaterally Heart: irregularly irregular rhythm Extremities: extremities normal, atraumatic, no cyanosis or edema Pulses: 2+ and symmetric Skin: Skin color, texture, turgor normal. No  rashes or lesions Neurologic: Alert and oriented X 3, normal strength and tone. Normal symmetric reflexes. Normal coordination and gait  EKG atrial fib with a ventricular spots of 105.  Personally reviewed this EKG.  ASSESSMENT AND PLAN:   HYPERLIPIDEMIA History of hyperlipidemia on statin therapy with lipid profile performed 03/24/2020 revealing total cholesterol 125, LDL 67 and HDL 40.  HYPERTENSION History of essential hypertension a blood pressure measured today 170/90.  He is on amlodipine, high-dose lisinopril and metoprolol.  He does have a ventricular spots in the low 100 range in A. fib.  I am going to change his losartan to olmesartan 80 mg a day and change his amlodipine to Cardizem CD 180 mg a day.  I  have asked him to keep a blood pressure log for the next 30 days he will make an appointment for him to see a Pharm.D. back to review make changes.  I will check a basic metabolic panel 2 weeks.  Atrial fibrillation (HCC) History of atrial fibrillation on Eliquis status post recent DC cardioversion by Dr. Meda Coffee 04/27/2020 successfully after 1 shock.  He says he went back in A. fib a week later.  Does complain of dyspnea on exertion.  I would refer him to the A. fib clinic for further evaluation.  He may benefit from being on flecainide or Tikosyn with repeat cardioversion versus ablation.  Coronary artery calcification seen on CT scan History of coronary calcification with  recent negative Myoview.  Patient denies chest pain.      Lorretta Harp MD FACP,FACC,FAHA, [facility] 05/11/2020 10:35 AM

## 2020-05-11 NOTE — Assessment & Plan Note (Signed)
History of coronary calcification with  recent negative Myoview.  Patient denies chest pain.

## 2020-05-11 NOTE — Assessment & Plan Note (Signed)
History of essential hypertension a blood pressure measured today 170/90.  He is on amlodipine, high-dose lisinopril and metoprolol.  He does have a ventricular spots in the low 100 range in A. fib.  I am going to change his losartan to olmesartan 80 mg a day and change his amlodipine to Cardizem CD 180 mg a day.  I have asked him to keep a blood pressure log for the next 30 days he will make an appointment for him to see a Pharm.D. back to review make changes.  I will check a basic metabolic panel 2 weeks.

## 2020-05-11 NOTE — Addendum Note (Signed)
Addended by: Patria Mane A on: 05/11/2020 10:44 AM   Modules accepted: Orders

## 2020-05-11 NOTE — Assessment & Plan Note (Signed)
History of atrial fibrillation on Eliquis status post recent DC cardioversion by Dr. Meda Coffee 04/27/2020 successfully after 1 shock.  He says he went back in A. fib a week later.  Does complain of dyspnea on exertion.  I would refer him to the A. fib clinic for further evaluation.  He may benefit from being on flecainide or Tikosyn with repeat cardioversion versus ablation.

## 2020-05-11 NOTE — Progress Notes (Signed)
Primary Care Physician: [name], PA Primary Cardiologist: Dr Gwenlyn Found Primary Electrophysiologist: none Referring Physician: Dr Christie Beckers is a 74 y.o. male with a history of persistent atrial fibrillation, CAD, HTN, HLD, DM, and liver cancer who presents for consultation in the [facility]. The patient was initially diagnosed with atrial fibrillation 02/2020 incidentally while having a Port-A-Cath placed for his chemo. Patient is on Eliquis for a CHADS2VASC score of 4. He underwent DCCV on 04/27/20 but unfortunately reverted back to afib after about one week. Patient did feel less fatigued while in SR. He denies significant snoring or alcohol use.   Today, he denies symptoms of palpitations, chest pain, shortness of breath, orthopnea, PND, lower extremity edema, dizziness, presyncope, syncope, snoring, daytime somnolence, bleeding, or neurologic sequela. The patient is tolerating medications without difficulties and is otherwise without complaint today.    Atrial Fibrillation Risk Factors:  he does not have symptoms or diagnosis of sleep apnea. he does not have a history of rheumatic fever. he does not have a history of alcohol use. The patient does not have a history of early familial atrial fibrillation or other arrhythmias.  he has a BMI of Body mass index is 30.91 kg/m.Marland Kitchen Filed Weights   05/12/20 1040  Weight: 100.5 kg    Family History  Problem Relation Age of Onset   Heart attack Mother    Stroke Father    Colon cancer Neg Hx    Esophageal cancer Neg Hx    Stomach cancer Neg Hx    Rectal cancer Neg Hx    Colon polyps Neg Hx      Atrial Fibrillation Management history:  Previous antiarrhythmic drugs: none Previous cardioversions: 04/27/20 Previous ablations: none CHADS2VASC score: 4 Anticoagulation history: Eliquis    Past Medical History:  Diagnosis Date   Arthritis    Diabetes ([place])    GERD  (gastroesophageal reflux disease)    Hyperlipidemia    Hypertension    Intrahepatic cholangiocarcinoma ([place])    Past Surgical History:  Procedure Laterality Date   APPENDECTOMY  1980   CARDIOVERSION N/A 04/27/2020   Procedure: CARDIOVERSION;  Surgeon: Dorothy Spark, MD;  Location: [facility] ENDOSCOPY;  Service: Cardiovascular;  Laterality: N/A;   COLONOSCOPY     IR IMAGING GUIDED PORT INSERTION  02/23/2020    Current Outpatient Medications  Medication Sig Dispense Refill   allopurinol (ZYLOPRIM) 100 MG tablet Take 100 mg by mouth daily.     apixaban (ELIQUIS) 5 MG TABS tablet Take 1 tablet (5 mg total) by mouth 2 (two) times daily. 180 tablet 3   diltiazem (CARDIZEM CD) 180 MG 24 hr capsule Take 1 capsule (180 mg total) by mouth daily. 90 capsule 3   fenofibrate (TRICOR) 145 MG tablet Take 145 mg by mouth daily.     finasteride (PROSCAR) 5 MG tablet Take 5 mg by mouth daily.     furosemide (LASIX) 20 MG tablet Take 1 tablet (20 mg total) by mouth every other day. 45 tablet 3   lidocaine-prilocaine (EMLA) cream Apply 1 application topically as needed. 30 g 3   lisinopril (ZESTRIL) 40 MG tablet Take 1 tablet (40 mg total) by mouth daily. 90 tablet 3   magnesium oxide (MAG-OX) 400 MG tablet TAKE 1 TABLET BY MOUTH EVERY DAY 30 tablet 0   metFORMIN (GLUCOPHAGE) 500 MG tablet Take 500 mg by mouth 2 (two) times daily with a meal.      metoprolol  succinate (TOPROL-XL) 25 MG 24 hr tablet Take 1 tablet (25 mg total) by mouth daily. 30 tablet 6   ondansetron (ZOFRAN) 8 MG tablet Take 1 tablet (8 mg total) by mouth 2 (two) times daily as needed. Start on the third day after chemotherapy. 30 tablet 1   pantoprazole (PROTONIX) 40 MG tablet Take 1 tablet (40 mg total) by mouth 2 (two) times daily. 180 tablet 0   pravastatin (PRAVACHOL) 40 MG tablet Take 40 mg by mouth daily.     sucralfate (CARAFATE) 1 g tablet TAKE 1 TABLET BY MOUTH EVERY 6 HOURS AS NEEDED. 180 tablet 0   tamsulosin  (FLOMAX) 0.4 MG CAPS capsule Take 0.4 mg by mouth daily.     Vitamin D, Ergocalciferol, (DRISDOL) 1.25 MG (50000 UT) CAPS capsule Take 50,000 Units by mouth every 7 (seven) days.     No current facility-administered medications for this encounter.    No Known Allergies  Social History   Socioeconomic History   Marital status: Married    Spouse name: Not on file   Number of children: 3   Years of education: Not on file   Highest education level: Not on file  Occupational History   Not on file  Tobacco Use   Smoking status: Never Smoker   Smokeless tobacco: Current User    Types: Chew   Tobacco comment: for 60 years, 1-2 daily   Vaping Use   Vaping Use: Never used  Substance and Sexual Activity   Alcohol use: Not Currently    Comment: occasionally   Drug use: Never   Sexual activity: Not on file  Other Topics Concern   Not on file  Social History Narrative   Not on file   Social Determinants of Health   Financial Resource Strain:    Difficulty of Paying Living Expenses:   Food Insecurity:    Worried About Charity fundraiser in the Last Year:    Arboriculturist in the Last Year:   Transportation Needs:    Film/video editor (Medical):    Lack of Transportation (Non-Medical):   Physical Activity:    Days of Exercise per Week:    Minutes of Exercise per Session:   Stress:    Feeling of Stress :   Social Connections:    Frequency of Communication with Friends and Family:    Frequency of Social Gatherings with Friends and Family:    Attends Religious Services:    Active Member of Clubs or Organizations:    Attends Music therapist:    Marital Status:   Intimate Partner Violence:    Fear of Current or Ex-Partner:    Emotionally Abused:    Physically Abused:    Sexually Abused:      ROS- All systems are reviewed and negative except as per the HPI above.  Physical Exam: Vitals:   05/12/20 1040  BP: (!)  150/78  Pulse: 85  Weight: 100.5 kg  Height: 5\' 11"  (1.803 m)    GEN- The patient is well appearing obese male, alert and oriented x 3 today.   Head- normocephalic, atraumatic Eyes-  Sclera clear, conjunctiva pink Ears- hearing intact Oropharynx- clear Neck- supple  Lungs- Clear to ausculation bilaterally, normal work of breathing Heart- irregular rate and rhythm, no murmurs, rubs or gallops  GI- soft, NT, ND, + BS Extremities- no clubbing, cyanosis, or edema MS- no significant deformity or atrophy Skin- no rash or lesion Psych- euthymic mood, full  affect Neuro- strength and sensation are intact  Wt Readings from Last 3 Encounters:  05/12/20 100.5 kg  05/11/20 100.1 kg  05/10/20 99.2 kg    EKG today demonstrates coarse afib HR 85, QRS 82, QTc 447  Echo 04/13/20 demonstrated  1. Definity used; normal LV systolic function; mild LVH; mildly dilated  aortic root; mild AI; mild MR.  2. Left ventricular ejection fraction, by estimation, is 60 to 65%. The  left ventricle has normal function. The left ventricle has no regional  wall motion abnormalities. There is mild left ventricular hypertrophy.  Left ventricular diastolic function  could not be evaluated.  3. Right ventricular systolic function is normal. The right ventricular  size is normal. There is mildly elevated pulmonary artery systolic  pressure.  4. The mitral valve is normal in structure. Mild mitral valve  regurgitation. No evidence of mitral stenosis.  5. The aortic valve is tricuspid. Aortic valve regurgitation is mild. No  aortic stenosis is present.  6. Aortic dilatation noted. There is mild dilatation of the aortic root  measuring 41 mm.  7. The inferior vena cava is normal in size with greater than 50%  respiratory variability, suggesting right atrial pressure of 3 mmHg.   Epic records are reviewed at length today  CHA2DS2-VASc Score = 4  The patient's score is based upon: CHF History: 0 HTN  History: 1 Age : 1 Diabetes History: 1 Stroke History: 0 Vascular Disease History: 1 Gender: 0      ASSESSMENT AND PLAN: 1. Persistent Atrial Fibrillation (ICD10:  I48.19) The patient's CHA2DS2-VASc score is 4, indicating a 4.8% annual risk of stroke.   S/p DCCV on 04/27/20 with ERAF We discussed therapeutic options including AAD and repeat DCCV. He has coronary calcifications on CT with a calcium score in the 93rd percentile, hesitant to use class 1C. His QT in SR is 455 and he frequently uses Zofran for nausea with his chemo which is a disincentive to use Tikosyn or sotalol. Could consider Multaq or amiodarone (LFTs normal on recent lab work).  Will plan to start Multaq 400 mg BID and repeat DCCV.  Continue Eliquis 5 mg BID Continue diltiazem 180 mg daily Continue Toprol 25 mg daily  2. Secondary Hypercoagulable State (ICD10:  D68.69) The patient is at significant risk for stroke/thromboembolism based upon his CHA2DS2-VASc Score of 4.  Continue Apixaban (Eliquis).   3. Obesity Body mass index is 30.91 kg/m. Lifestyle modification was discussed at length including regular exercise and weight reduction.  4. CAD Coronary calcifications seen on CT, negative stress test. No anginal symptoms.  5. HTN Borderline elevated today. Medications adjusted yesterday by Dr Gwenlyn Found. No changes today.   Follow up in the AF clinic one week post DCCV.    [facility] [address] [place], [place] [address] [phone] 05/12/2020 5:08 PM

## 2020-05-11 NOTE — Patient Instructions (Addendum)
Medication Instructions:  STOP amlodipine  START Cardizem CD 180 mg daily  *If you need a refill on your cardiac medications before your next appointment, please call your pharmacy*   Lab Work: Return for labs in 2 weeks (BMET)-no appointment needed  If you have labs (blood work) drawn today and your tests are completely normal, you will receive your results only by: Marland Kitchen MyChart Message (if you have MyChart) OR . A paper copy in the mail If you have any lab test that is abnormal or we need to change your treatment, we will call you to review the results.  Follow-Up: At [facility], you and your health needs are our priority.  As part of our continuing mission to provide you with exceptional heart care, we have created designated Provider Care Teams.  These Care Teams include your primary Cardiologist (physician) and Advanced Practice Providers (APPs -  Physician Assistants and Nurse Practitioners) who all work together to provide you with the care you need, when you need it.  We recommend signing up for the patient portal called "MyChart".  Sign up information is provided on this After Visit Summary.  MyChart is used to connect with patients for Virtual Visits (Telemedicine).  Patients are able to view lab/test results, encounter notes, upcoming appointments, etc.  Non-urgent messages can be sent to your provider as well.   To learn more about what you can do with MyChart, go to [email or web address].    Your next appointment:    4 weeks with pharmacist  3 months with APP 6 months with Dr. Andria Rhein have been referred to Atrial Fibrillation clinic at [facility]    Other Instructions Please check your blood pressure at home daily for the next 30 days, write it down.  Bring blood pressure log to next appointment with pharmacist

## 2020-05-11 NOTE — Assessment & Plan Note (Signed)
History of hyperlipidemia on statin therapy with lipid profile performed 03/24/2020 revealing total cholesterol 125, LDL 67 and HDL 40.

## 2020-05-12 ENCOUNTER — Other Ambulatory Visit: Payer: Self-pay

## 2020-05-12 ENCOUNTER — Encounter (HOSPITAL_COMMUNITY): Payer: Self-pay | Admitting: Physician Assistant

## 2020-05-12 ENCOUNTER — Ambulatory Visit (HOSPITAL_COMMUNITY)
Admission: RE | Admit: 2020-05-12 | Discharge: 2020-05-12 | Disposition: A | Discharge status: Home / Self Care | Payer: Medicare HMO | Source: Ambulatory Visit | Attending: Physician Assistant | Admitting: Physician Assistant

## 2020-05-12 VITALS — BP 150/78 | HR 85 | Ht 71.0 in | Wt 221.6 lb

## 2020-05-12 DIAGNOSIS — C229 Malignant neoplasm of liver, not specified as primary or secondary: Secondary | ICD-10-CM | POA: Insufficient documentation

## 2020-05-12 DIAGNOSIS — Z79899 Other long term (current) drug therapy: Secondary | ICD-10-CM | POA: Insufficient documentation

## 2020-05-12 DIAGNOSIS — I2510 Atherosclerotic heart disease of native coronary artery without angina pectoris: Secondary | ICD-10-CM | POA: Diagnosis not present

## 2020-05-12 DIAGNOSIS — K219 Gastro-esophageal reflux disease without esophagitis: Secondary | ICD-10-CM | POA: Insufficient documentation

## 2020-05-12 DIAGNOSIS — Z7984 Long term (current) use of oral hypoglycemic drugs: Secondary | ICD-10-CM | POA: Insufficient documentation

## 2020-05-12 DIAGNOSIS — Z823 Family history of stroke: Secondary | ICD-10-CM | POA: Diagnosis not present

## 2020-05-12 DIAGNOSIS — E785 Hyperlipidemia, unspecified: Secondary | ICD-10-CM | POA: Insufficient documentation

## 2020-05-12 DIAGNOSIS — I10 Essential (primary) hypertension: Secondary | ICD-10-CM | POA: Insufficient documentation

## 2020-05-12 DIAGNOSIS — E6609 Other obesity due to excess calories: Secondary | ICD-10-CM | POA: Diagnosis not present

## 2020-05-12 DIAGNOSIS — E119 Type 2 diabetes mellitus without complications: Secondary | ICD-10-CM | POA: Insufficient documentation

## 2020-05-12 DIAGNOSIS — I4819 Other persistent atrial fibrillation: Secondary | ICD-10-CM | POA: Diagnosis not present

## 2020-05-12 DIAGNOSIS — F17220 Nicotine dependence, chewing tobacco, uncomplicated: Secondary | ICD-10-CM | POA: Insufficient documentation

## 2020-05-12 DIAGNOSIS — Z7901 Long term (current) use of anticoagulants: Secondary | ICD-10-CM | POA: Insufficient documentation

## 2020-05-12 DIAGNOSIS — Z6830 Body mass index (BMI) 30.0-30.9, adult: Secondary | ICD-10-CM | POA: Insufficient documentation

## 2020-05-12 DIAGNOSIS — I080 Rheumatic disorders of both mitral and aortic valves: Secondary | ICD-10-CM | POA: Insufficient documentation

## 2020-05-12 DIAGNOSIS — I517 Cardiomegaly: Secondary | ICD-10-CM | POA: Insufficient documentation

## 2020-05-12 DIAGNOSIS — Z8249 Family history of ischemic heart disease and other diseases of the circulatory system: Secondary | ICD-10-CM | POA: Diagnosis not present

## 2020-05-12 DIAGNOSIS — I2584 Coronary atherosclerosis due to calcified coronary lesion: Secondary | ICD-10-CM | POA: Insufficient documentation

## 2020-05-12 DIAGNOSIS — Z713 Dietary counseling and surveillance: Secondary | ICD-10-CM | POA: Insufficient documentation

## 2020-05-12 DIAGNOSIS — I77819 Aortic ectasia, unspecified site: Secondary | ICD-10-CM | POA: Insufficient documentation

## 2020-05-12 DIAGNOSIS — D6869 Other thrombophilia: Secondary | ICD-10-CM | POA: Insufficient documentation

## 2020-05-13 ENCOUNTER — Other Ambulatory Visit (HOSPITAL_COMMUNITY): Payer: Self-pay | Admitting: *Deleted

## 2020-05-13 MED ORDER — MULTAQ 400 MG PO TABS: 400.0000 mg | ORAL_TABLET | Freq: Two times a day (BID) | ORAL | 3 refills | Status: DC

## 2020-05-13 NOTE — Addendum Note (Signed)
Encounter addended by: Juluis Mire, RN on: 05/13/2020 12:09 PM  Actions taken: Pharmacy for encounter modified, Order list changed

## 2020-05-14 ENCOUNTER — Inpatient Hospital Stay (HOSPITAL_COMMUNITY): Admission: RE | Admit: 2020-05-14 | Payer: Medicare HMO | Source: Ambulatory Visit

## 2020-05-15 ENCOUNTER — Other Ambulatory Visit (HOSPITAL_COMMUNITY)
Admission: RE | Admit: 2020-05-15 | Discharge: 2020-05-15 | Disposition: A | Discharge status: Home / Self Care | Payer: Medicare HMO | Source: Ambulatory Visit | Attending: Internal Medicine | Admitting: Internal Medicine

## 2020-05-15 DIAGNOSIS — Z20822 Contact with and (suspected) exposure to covid-19: Secondary | ICD-10-CM | POA: Insufficient documentation

## 2020-05-15 DIAGNOSIS — Z01812 Encounter for preprocedural laboratory examination: Secondary | ICD-10-CM | POA: Insufficient documentation

## 2020-05-15 LAB — SARS CORONAVIRUS 2 (TAT 6-24 HRS): SARS Coronavirus 2: NEGATIVE

## 2020-05-19 ENCOUNTER — Ambulatory Visit (HOSPITAL_COMMUNITY): Payer: Medicare HMO | Admitting: Anesthesiology

## 2020-05-19 ENCOUNTER — Other Ambulatory Visit: Payer: Self-pay

## 2020-05-19 ENCOUNTER — Ambulatory Visit (HOSPITAL_BASED_OUTPATIENT_CLINIC_OR_DEPARTMENT_OTHER)
Admission: RE | Admit: 2020-05-19 | Discharge: 2020-05-19 | Disposition: A | Discharge status: Home / Self Care | Payer: Medicare HMO | Source: Home / Self Care | Attending: Internal Medicine | Admitting: Internal Medicine

## 2020-05-19 ENCOUNTER — Encounter (HOSPITAL_COMMUNITY): Payer: Self-pay | Admitting: Internal Medicine

## 2020-05-19 ENCOUNTER — Encounter (HOSPITAL_COMMUNITY)
Admission: RE | Disposition: A | Discharge status: Home / Self Care | Payer: Self-pay | Source: Home / Self Care | Attending: Internal Medicine

## 2020-05-19 DIAGNOSIS — Z20822 Contact with and (suspected) exposure to covid-19: Secondary | ICD-10-CM | POA: Diagnosis present

## 2020-05-19 DIAGNOSIS — D696 Thrombocytopenia, unspecified: Secondary | ICD-10-CM | POA: Diagnosis present

## 2020-05-19 DIAGNOSIS — I4819 Other persistent atrial fibrillation: Secondary | ICD-10-CM | POA: Diagnosis not present

## 2020-05-19 DIAGNOSIS — E785 Hyperlipidemia, unspecified: Secondary | ICD-10-CM | POA: Diagnosis present

## 2020-05-19 DIAGNOSIS — I509 Heart failure, unspecified: Secondary | ICD-10-CM | POA: Diagnosis present

## 2020-05-19 DIAGNOSIS — I2510 Atherosclerotic heart disease of native coronary artery without angina pectoris: Secondary | ICD-10-CM | POA: Diagnosis present

## 2020-05-19 DIAGNOSIS — Z7984 Long term (current) use of oral hypoglycemic drugs: Secondary | ICD-10-CM | POA: Diagnosis not present

## 2020-05-19 DIAGNOSIS — I130 Hypertensive heart and chronic kidney disease with heart failure and stage 1 through stage 4 chronic kidney disease, or unspecified chronic kidney disease: Secondary | ICD-10-CM | POA: Diagnosis present

## 2020-05-19 DIAGNOSIS — C221 Intrahepatic bile duct carcinoma: Secondary | ICD-10-CM | POA: Diagnosis present

## 2020-05-19 DIAGNOSIS — M109 Gout, unspecified: Secondary | ICD-10-CM | POA: Diagnosis present

## 2020-05-19 DIAGNOSIS — N179 Acute kidney failure, unspecified: Secondary | ICD-10-CM | POA: Diagnosis present

## 2020-05-19 DIAGNOSIS — Z6832 Body mass index (BMI) 32.0-32.9, adult: Secondary | ICD-10-CM | POA: Diagnosis not present

## 2020-05-19 DIAGNOSIS — R0602 Shortness of breath: Secondary | ICD-10-CM | POA: Diagnosis not present

## 2020-05-19 DIAGNOSIS — I4430 Unspecified atrioventricular block: Secondary | ICD-10-CM | POA: Diagnosis not present

## 2020-05-19 DIAGNOSIS — E1122 Type 2 diabetes mellitus with diabetic chronic kidney disease: Secondary | ICD-10-CM | POA: Diagnosis present

## 2020-05-19 DIAGNOSIS — T464X5A Adverse effect of angiotensin-converting-enzyme inhibitors, initial encounter: Secondary | ICD-10-CM | POA: Diagnosis present

## 2020-05-19 DIAGNOSIS — I517 Cardiomegaly: Secondary | ICD-10-CM | POA: Diagnosis not present

## 2020-05-19 DIAGNOSIS — E669 Obesity, unspecified: Secondary | ICD-10-CM | POA: Diagnosis present

## 2020-05-19 DIAGNOSIS — J90 Pleural effusion, not elsewhere classified: Secondary | ICD-10-CM | POA: Diagnosis not present

## 2020-05-19 DIAGNOSIS — I10 Essential (primary) hypertension: Secondary | ICD-10-CM | POA: Diagnosis not present

## 2020-05-19 DIAGNOSIS — K219 Gastro-esophageal reflux disease without esophagitis: Secondary | ICD-10-CM | POA: Diagnosis present

## 2020-05-19 DIAGNOSIS — I4891 Unspecified atrial fibrillation: Secondary | ICD-10-CM | POA: Diagnosis not present

## 2020-05-19 DIAGNOSIS — I482 Chronic atrial fibrillation, unspecified: Secondary | ICD-10-CM | POA: Diagnosis not present

## 2020-05-19 DIAGNOSIS — D6959 Other secondary thrombocytopenia: Secondary | ICD-10-CM | POA: Diagnosis present

## 2020-05-19 DIAGNOSIS — J9601 Acute respiratory failure with hypoxia: Secondary | ICD-10-CM | POA: Diagnosis present

## 2020-05-19 DIAGNOSIS — E119 Type 2 diabetes mellitus without complications: Secondary | ICD-10-CM | POA: Diagnosis not present

## 2020-05-19 DIAGNOSIS — J80 Acute respiratory distress syndrome: Secondary | ICD-10-CM | POA: Diagnosis not present

## 2020-05-19 DIAGNOSIS — M1000 Idiopathic gout, unspecified site: Secondary | ICD-10-CM | POA: Diagnosis not present

## 2020-05-19 DIAGNOSIS — I480 Paroxysmal atrial fibrillation: Secondary | ICD-10-CM | POA: Diagnosis present

## 2020-05-19 DIAGNOSIS — I110 Hypertensive heart disease with heart failure: Secondary | ICD-10-CM | POA: Diagnosis not present

## 2020-05-19 DIAGNOSIS — I5031 Acute diastolic (congestive) heart failure: Secondary | ICD-10-CM | POA: Diagnosis present

## 2020-05-19 DIAGNOSIS — D649 Anemia, unspecified: Secondary | ICD-10-CM | POA: Diagnosis present

## 2020-05-19 DIAGNOSIS — D630 Anemia in neoplastic disease: Secondary | ICD-10-CM | POA: Diagnosis present

## 2020-05-19 DIAGNOSIS — I440 Atrioventricular block, first degree: Secondary | ICD-10-CM | POA: Diagnosis not present

## 2020-05-19 DIAGNOSIS — Z7901 Long term (current) use of anticoagulants: Secondary | ICD-10-CM | POA: Diagnosis not present

## 2020-05-19 DIAGNOSIS — I491 Atrial premature depolarization: Secondary | ICD-10-CM | POA: Diagnosis not present

## 2020-05-19 DIAGNOSIS — Z8249 Family history of ischemic heart disease and other diseases of the circulatory system: Secondary | ICD-10-CM | POA: Diagnosis not present

## 2020-05-19 DIAGNOSIS — N182 Chronic kidney disease, stage 2 (mild): Secondary | ICD-10-CM | POA: Diagnosis present

## 2020-05-19 DIAGNOSIS — T451X5A Adverse effect of antineoplastic and immunosuppressive drugs, initial encounter: Secondary | ICD-10-CM | POA: Diagnosis present

## 2020-05-19 DIAGNOSIS — I272 Pulmonary hypertension, unspecified: Secondary | ICD-10-CM | POA: Diagnosis present

## 2020-05-19 HISTORY — PX: CARDIOVERSION: SHX1299

## 2020-05-19 LAB — GLUCOSE, CAPILLARY: Glucose-Capillary: 85 mg/dL (ref 70–99)

## 2020-05-19 SURGERY — CARDIOVERSION
Anesthesia: General

## 2020-05-19 MED ORDER — APIXABAN 5 MG PO TABS
5.0000 mg | ORAL_TABLET | Freq: Once | ORAL | Status: AC
  Administered 2020-05-19: 5 mg via ORAL
  Filled 2020-05-19 (×2): qty 1

## 2020-05-19 MED ORDER — LIDOCAINE 2% (20 MG/ML) 5 ML SYRINGE
INTRAMUSCULAR | Status: DC | PRN
  Administered 2020-05-19: 30 mg via INTRAVENOUS

## 2020-05-19 MED ORDER — SODIUM CHLORIDE 0.9 % IV SOLN: INTRAVENOUS | Status: DC | PRN

## 2020-05-19 MED ORDER — PROPOFOL 10 MG/ML IV BOLUS
INTRAVENOUS | Status: DC | PRN
  Administered 2020-05-19: 80 mg via INTRAVENOUS

## 2020-05-19 NOTE — Transfer of Care (Signed)
Immediate Anesthesia Transfer of Care Note  Patient: Brandon Sherman  Procedure(s) Performed: CARDIOVERSION (N/A )  Patient Location: Endoscopy Unit  Anesthesia Type:General  Level of Consciousness: awake, alert  and oriented  Airway & Oxygen Therapy: Patient Spontanous Breathing  Post-op Assessment: Report given to RN and Post -op Vital signs reviewed and stable  Post vital signs: Reviewed and stable  Last Vitals:  Vitals Value Taken Time  BP 142/76 05/19/20 1226  Temp    Pulse 67 05/19/20 1228  Resp 29 05/19/20 1228  SpO2 94 % 05/19/20 1228    Last Pain:  Vitals:   05/19/20 1050  TempSrc: Temporal  PainSc: 0-No pain         Complications: No complications documented.

## 2020-05-19 NOTE — Anesthesia Procedure Notes (Signed)
Procedure Name: General with mask airway Date/Time: 05/19/2020 12:22 PM Performed by: Kyung Rudd, CRNA Pre-anesthesia Checklist: Patient identified, Emergency Drugs available, Suction available, Patient being monitored and Timeout performed Patient Re-evaluated:Patient Re-evaluated prior to induction Oxygen Delivery Method: Ambu bag Preoxygenation: Pre-oxygenation with 100% oxygen Induction Type: IV induction Ventilation: Mask ventilation without difficulty Dental Injury: Teeth and Oropharynx as per pre-operative assessment

## 2020-05-19 NOTE — CV Procedure (Signed)
Procedure: Electrical Cardioversion Indications:  Atrial Fibrillation  Procedure Details:  Consent: Risks of procedure as well as the alternatives and risks of each were explained to the (patient/caregiver).  Consent for procedure obtained.  Time Out: Verified patient identification, verified procedure, site/side was marked, verified correct patient position, special equipment/implants available, medications/allergies/relevent history reviewed, required imaging and test results available. PERFORMED.  Patient placed on cardiac monitor, pulse oximetry, supplemental oxygen as necessary.  Sedation given: propofol per anesthesia Pacer pads placed anterior and posterior chest.  Cardioverted 2 time(s).  Cardioversion with synchronized biphasic 120J, 200J shock.  Evaluation: Findings: Post procedure EKG shows: NSR Complications: None Patient did tolerate procedure well.  Time Spent Directly with the Patient:  46minutes   Elouise Munroe 05/19/2020, 12:28 PM

## 2020-05-19 NOTE — Anesthesia Preprocedure Evaluation (Addendum)
Anesthesia Evaluation  Patient identified by MRN, date of birth, ID band Patient awake    Reviewed: Allergy & Precautions, NPO status , Patient's Chart, lab work & pertinent test results, reviewed documented beta blocker date and time   History of Anesthesia Complications Negative for: history of anesthetic complications  Airway Mallampati: I  TM Distance: >3 FB     Dental  (+) Edentulous Upper, Edentulous Lower   Pulmonary neg pulmonary ROS,  05/15/2020 SARS coronavirus NEG   breath sounds clear to auscultation       Cardiovascular hypertension, Pt. on medications and Pt. on home beta blockers (-) angina+ dysrhythmias Atrial Fibrillation  Rhythm:Irregular Rate:Normal  05/05/2020 Stress: Nuclear stress EF: 63%, no ST segment deviation noted during stress.  The study is normal.  This is a low risk study    Neuro/Psych negative neurological ROS     GI/Hepatic Neg liver ROS, GERD  Medicated and Controlled,  Endo/Other  diabetes, Oral Hypoglycemic Agents  Renal/GU negative Renal ROS     Musculoskeletal   Abdominal (+) + obese,   Peds  Hematology  (+) Blood dyscrasia (Hb 9.1), anemia , eliquis   Anesthesia Other Findings   Reproductive/Obstetrics                            Anesthesia Physical Anesthesia Plan  ASA: III  Anesthesia Plan: General   Post-op Pain Management:    Induction: Intravenous  PONV Risk Score and Plan: Treatment may vary due to age or medical condition  Airway Management Planned: Natural Airway and Mask  Additional Equipment: None  Intra-op Plan:   Post-operative Plan:   Informed Consent: I have reviewed the patients History and Physical, chart, labs and discussed the procedure including the risks, benefits and alternatives for the proposed anesthesia with the patient or authorized representative who has indicated his/her understanding and acceptance.        Plan Discussed with: CRNA and Surgeon  Anesthesia Plan Comments:        Anesthesia Quick Evaluation

## 2020-05-19 NOTE — Anesthesia Postprocedure Evaluation (Signed)
Anesthesia Post Note  Patient: Brandon Sherman  Procedure(s) Performed: CARDIOVERSION (N/A )     Patient location during evaluation: Endoscopy Anesthesia Type: General Level of consciousness: awake and alert, patient cooperative and oriented Pain management: pain level controlled Vital Signs Assessment: post-procedure vital signs reviewed and stable Respiratory status: spontaneous breathing, respiratory function stable and nonlabored ventilation Cardiovascular status: blood pressure returned to baseline and stable Postop Assessment: no apparent nausea or vomiting Anesthetic complications: no   No complications documented.  Last Vitals:  Vitals:   05/19/20 1240 05/19/20 1245  BP: (!) 159/79 (!) 159/81  Pulse: 71 73  Resp: (!) 28 19  Temp:    SpO2: 96% 97%    Last Pain:  Vitals:   05/19/20 1228  TempSrc:   PainSc: 0-No pain                 Rolly Magri,E. Branna Cortina

## 2020-05-19 NOTE — Discharge Instructions (Signed)
Electrical Cardioversion Electrical cardioversion is the delivery of a jolt of electricity to restore a normal rhythm to the heart. A rhythm that is too fast or is not regular keeps the heart from pumping well. In this procedure, sticky patches or metal paddles are placed on the chest to deliver electricity to the heart from a device. This procedure may be done in an emergency if:  There is low or no blood pressure as a result of the heart rhythm.  Normal rhythm must be restored as fast as possible to protect the brain and heart from further damage.  It may save a life. This may also be a scheduled procedure for irregular or fast heart rhythms that are not immediately life-threatening. Tell a health care provider about:  Any allergies you have.  All medicines you are taking, including vitamins, herbs, eye drops, creams, and over-the-counter medicines.  Any problems you or family members have had with anesthetic medicines.  Any blood disorders you have.  Any surgeries you have had.  Any medical conditions you have.  Whether you are pregnant or may be pregnant. What are the risks? Generally, this is a safe procedure. However, problems may occur, including:  Allergic reactions to medicines.  A blood clot that breaks free and travels to other parts of your body.  The possible return of an abnormal heart rhythm within hours or days after the procedure.  Your heart stopping (cardiac arrest). This is rare. What happens before the procedure? Medicines  Your health care provider may have you start taking: ? Blood-thinning medicines (anticoagulants) so your blood does not clot as easily. ? Medicines to help stabilize your heart rate and rhythm.  Ask your health care provider about: ? Changing or stopping your regular medicines. This is especially important if you are taking diabetes medicines or blood thinners. ? Taking medicines such as aspirin and ibuprofen. These medicines can  thin your blood. Do not take these medicines unless your health care provider tells you to take them. ? Taking over-the-counter medicines, vitamins, herbs, and supplements. General instructions  Follow instructions from your health care provider about eating or drinking restrictions.  Plan to have someone take you home from the hospital or clinic.  If you will be going home right after the procedure, plan to have someone with you for 24 hours.  Ask your health care provider what steps will be taken to help prevent infection. These may include washing your skin with a germ-killing soap. What happens during the procedure?   An IV will be inserted into one of your veins.  Sticky patches (electrodes) or metal paddles may be placed on your chest.  You will be given a medicine to help you relax (sedative).  An electrical shock will be delivered. The procedure may vary among health care providers and hospitals. What can I expect after the procedure?  Your blood pressure, heart rate, breathing rate, and blood oxygen level will be monitored until you leave the hospital or clinic.  Your heart rhythm will be watched to make sure it does not change.  You may have some redness on the skin where the shocks were given. Follow these instructions at home:  Do not drive for 24 hours if you were given a sedative during your procedure.  Take over-the-counter and prescription medicines only as told by your health care provider.  Ask your health care provider how to check your pulse. Check it often.  Rest for 48 hours after the procedure or   as told by your health care provider.  Avoid or limit your caffeine use as told by your health care provider.  Keep all follow-up visits as told by your health care provider. This is important. Contact a health care provider if:  You feel like your heart is beating too quickly or your pulse is not regular.  You have a serious muscle cramp that does not go  away. Get help right away if:  You have discomfort in your chest.  You are dizzy or you feel faint.  You have trouble breathing or you are short of breath.  Your speech is slurred.  You have trouble moving an arm or leg on one side of your body.  Your fingers or toes turn cold or blue. Summary  Electrical cardioversion is the delivery of a jolt of electricity to restore a normal rhythm to the heart.  This procedure may be done right away in an emergency or may be a scheduled procedure if the condition is not an emergency.  Generally, this is a safe procedure.  After the procedure, check your pulse often as told by your health care provider. This information is not intended to replace advice given to you by your health care provider. Make sure you discuss any questions you have with your health care provider. Document Revised: 06/09/2019 Document Reviewed: 06/09/2019 Elsevier Patient Education  2020 [employer]  

## 2020-05-20 ENCOUNTER — Other Ambulatory Visit: Payer: Self-pay

## 2020-05-20 ENCOUNTER — Encounter (HOSPITAL_COMMUNITY): Payer: Self-pay | Admitting: Internal Medicine

## 2020-05-20 ENCOUNTER — Emergency Department (HOSPITAL_COMMUNITY): Payer: Medicare HMO

## 2020-05-20 ENCOUNTER — Inpatient Hospital Stay (HOSPITAL_COMMUNITY): Payer: Medicare HMO

## 2020-05-20 ENCOUNTER — Inpatient Hospital Stay (HOSPITAL_COMMUNITY)
Admission: EM | Admit: 2020-05-20 | Discharge: 2020-05-22 | DRG: 291 | Disposition: A | Discharge status: Home / Self Care | Payer: Medicare HMO | Attending: Internal Medicine | Admitting: Internal Medicine

## 2020-05-20 DIAGNOSIS — I10 Essential (primary) hypertension: Secondary | ICD-10-CM | POA: Diagnosis not present

## 2020-05-20 DIAGNOSIS — I2510 Atherosclerotic heart disease of native coronary artery without angina pectoris: Secondary | ICD-10-CM | POA: Diagnosis present

## 2020-05-20 DIAGNOSIS — D696 Thrombocytopenia, unspecified: Secondary | ICD-10-CM | POA: Diagnosis present

## 2020-05-20 DIAGNOSIS — N182 Chronic kidney disease, stage 2 (mild): Secondary | ICD-10-CM | POA: Diagnosis present

## 2020-05-20 DIAGNOSIS — Z79899 Other long term (current) drug therapy: Secondary | ICD-10-CM

## 2020-05-20 DIAGNOSIS — I480 Paroxysmal atrial fibrillation: Secondary | ICD-10-CM | POA: Diagnosis present

## 2020-05-20 DIAGNOSIS — I272 Pulmonary hypertension, unspecified: Secondary | ICD-10-CM | POA: Diagnosis present

## 2020-05-20 DIAGNOSIS — D630 Anemia in neoplastic disease: Secondary | ICD-10-CM | POA: Diagnosis present

## 2020-05-20 DIAGNOSIS — K219 Gastro-esophageal reflux disease without esophagitis: Secondary | ICD-10-CM | POA: Diagnosis present

## 2020-05-20 DIAGNOSIS — I509 Heart failure, unspecified: Secondary | ICD-10-CM

## 2020-05-20 DIAGNOSIS — Z20822 Contact with and (suspected) exposure to covid-19: Secondary | ICD-10-CM | POA: Diagnosis present

## 2020-05-20 DIAGNOSIS — D649 Anemia, unspecified: Secondary | ICD-10-CM | POA: Diagnosis present

## 2020-05-20 DIAGNOSIS — M109 Gout, unspecified: Secondary | ICD-10-CM | POA: Diagnosis present

## 2020-05-20 DIAGNOSIS — Z7901 Long term (current) use of anticoagulants: Secondary | ICD-10-CM | POA: Diagnosis not present

## 2020-05-20 DIAGNOSIS — R0600 Dyspnea, unspecified: Secondary | ICD-10-CM

## 2020-05-20 DIAGNOSIS — T451X5A Adverse effect of antineoplastic and immunosuppressive drugs, initial encounter: Secondary | ICD-10-CM | POA: Diagnosis present

## 2020-05-20 DIAGNOSIS — Z8249 Family history of ischemic heart disease and other diseases of the circulatory system: Secondary | ICD-10-CM | POA: Diagnosis not present

## 2020-05-20 DIAGNOSIS — N179 Acute kidney failure, unspecified: Secondary | ICD-10-CM | POA: Diagnosis present

## 2020-05-20 DIAGNOSIS — E785 Hyperlipidemia, unspecified: Secondary | ICD-10-CM | POA: Diagnosis present

## 2020-05-20 DIAGNOSIS — M1000 Idiopathic gout, unspecified site: Secondary | ICD-10-CM | POA: Diagnosis not present

## 2020-05-20 DIAGNOSIS — T464X5A Adverse effect of angiotensin-converting-enzyme inhibitors, initial encounter: Secondary | ICD-10-CM | POA: Diagnosis present

## 2020-05-20 DIAGNOSIS — Z6832 Body mass index (BMI) 32.0-32.9, adult: Secondary | ICD-10-CM

## 2020-05-20 DIAGNOSIS — R0602 Shortness of breath: Secondary | ICD-10-CM | POA: Diagnosis not present

## 2020-05-20 DIAGNOSIS — E669 Obesity, unspecified: Secondary | ICD-10-CM | POA: Diagnosis present

## 2020-05-20 DIAGNOSIS — J9601 Acute respiratory failure with hypoxia: Secondary | ICD-10-CM | POA: Diagnosis present

## 2020-05-20 DIAGNOSIS — I5031 Acute diastolic (congestive) heart failure: Secondary | ICD-10-CM | POA: Diagnosis present

## 2020-05-20 DIAGNOSIS — I130 Hypertensive heart and chronic kidney disease with heart failure and stage 1 through stage 4 chronic kidney disease, or unspecified chronic kidney disease: Principal | ICD-10-CM | POA: Diagnosis present

## 2020-05-20 DIAGNOSIS — Z7984 Long term (current) use of oral hypoglycemic drugs: Secondary | ICD-10-CM

## 2020-05-20 DIAGNOSIS — E1122 Type 2 diabetes mellitus with diabetic chronic kidney disease: Secondary | ICD-10-CM | POA: Diagnosis present

## 2020-05-20 DIAGNOSIS — I5021 Acute systolic (congestive) heart failure: Secondary | ICD-10-CM

## 2020-05-20 DIAGNOSIS — I482 Chronic atrial fibrillation, unspecified: Secondary | ICD-10-CM | POA: Diagnosis not present

## 2020-05-20 DIAGNOSIS — Z823 Family history of stroke: Secondary | ICD-10-CM

## 2020-05-20 DIAGNOSIS — D6959 Other secondary thrombocytopenia: Secondary | ICD-10-CM | POA: Diagnosis present

## 2020-05-20 DIAGNOSIS — C221 Intrahepatic bile duct carcinoma: Secondary | ICD-10-CM | POA: Diagnosis present

## 2020-05-20 DIAGNOSIS — I4891 Unspecified atrial fibrillation: Secondary | ICD-10-CM | POA: Diagnosis present

## 2020-05-20 LAB — CBC WITH DIFFERENTIAL/PLATELET
Abs Immature Granulocytes: 0.05 10*3/uL (ref 0.00–0.07)
Basophils Absolute: 0 10*3/uL (ref 0.0–0.1)
Basophils Relative: 0 %
Eosinophils Absolute: 0 10*3/uL (ref 0.0–0.5)
Eosinophils Relative: 1 %
HCT: 29.4 % — ABNORMAL LOW (ref 39.0–52.0)
Hemoglobin: 9.5 g/dL — ABNORMAL LOW (ref 13.0–17.0)
Immature Granulocytes: 1 %
Lymphocytes Relative: 16 %
Lymphs Abs: 1.1 10*3/uL (ref 0.7–4.0)
MCH: 36.4 pg — ABNORMAL HIGH (ref 26.0–34.0)
MCHC: 32.3 g/dL (ref 30.0–36.0)
MCV: 112.6 fL — ABNORMAL HIGH (ref 80.0–100.0)
Monocytes Absolute: 0.5 10*3/uL (ref 0.1–1.0)
Monocytes Relative: 8 %
Neutro Abs: 5.1 10*3/uL (ref 1.7–7.7)
Neutrophils Relative %: 74 %
Platelets: 86 10*3/uL — ABNORMAL LOW (ref 150–400)
RBC: 2.61 MIL/uL — ABNORMAL LOW (ref 4.22–5.81)
RDW: 20 % — ABNORMAL HIGH (ref 11.5–15.5)
WBC: 6.8 10*3/uL (ref 4.0–10.5)
nRBC: 0.4 % — ABNORMAL HIGH (ref 0.0–0.2)

## 2020-05-20 LAB — COMPREHENSIVE METABOLIC PANEL
ALT: 17 U/L (ref 0–44)
AST: 23 U/L (ref 15–41)
Albumin: 3.6 g/dL (ref 3.5–5.0)
Alkaline Phosphatase: 42 U/L (ref 38–126)
Anion gap: 9 (ref 5–15)
BUN: 19 mg/dL (ref 8–23)
CO2: 21 mmol/L — ABNORMAL LOW (ref 22–32)
Calcium: 9.1 mg/dL (ref 8.9–10.3)
Chloride: 109 mmol/L (ref 98–111)
Creatinine, Ser: 1.63 mg/dL — ABNORMAL HIGH (ref 0.61–1.24)
GFR calc Af Amer: 48 mL/min — ABNORMAL LOW (ref >60)
GFR calc non Af Amer: 41 mL/min — ABNORMAL LOW (ref >60)
Glucose, Bld: 156 mg/dL — ABNORMAL HIGH (ref 70–99)
Potassium: 3.9 mmol/L (ref 3.5–5.1)
Sodium: 139 mmol/L (ref 135–145)
Total Bilirubin: 0.8 mg/dL (ref 0.3–1.2)
Total Protein: 6.2 g/dL — ABNORMAL LOW (ref 6.5–8.1)

## 2020-05-20 LAB — ECHOCARDIOGRAM LIMITED
Height: 69 in
Weight: 3552 oz

## 2020-05-20 LAB — GLUCOSE, CAPILLARY: Glucose-Capillary: 172 mg/dL — ABNORMAL HIGH (ref 70–99)

## 2020-05-20 LAB — TROPONIN I (HIGH SENSITIVITY)
Troponin I (High Sensitivity): 42 ng/L — ABNORMAL HIGH (ref <18)
Troponin I (High Sensitivity): 40 ng/L — ABNORMAL HIGH (ref <18)

## 2020-05-20 LAB — SARS CORONAVIRUS 2 BY RT PCR (HOSPITAL ORDER, PERFORMED IN ~~LOC~~ HOSPITAL LAB): SARS Coronavirus 2: NEGATIVE

## 2020-05-20 LAB — CBG MONITORING, ED
Glucose-Capillary: 82 mg/dL (ref 70–99)
Glucose-Capillary: 77 mg/dL (ref 70–99)
Glucose-Capillary: 104 mg/dL — ABNORMAL HIGH (ref 70–99)

## 2020-05-20 LAB — BRAIN NATRIURETIC PEPTIDE: B Natriuretic Peptide: 428.4 pg/mL — ABNORMAL HIGH (ref 0.0–100.0)

## 2020-05-20 LAB — D-DIMER, QUANTITATIVE: D-Dimer, Quant: 1.6 ug/mL-FEU — ABNORMAL HIGH (ref 0.00–0.50)

## 2020-05-20 LAB — LACTATE DEHYDROGENASE: LDH: 152 U/L (ref 98–192)

## 2020-05-20 LAB — MAGNESIUM: Magnesium: 1.7 mg/dL (ref 1.7–2.4)

## 2020-05-20 LAB — TSH: TSH: 2.131 u[IU]/mL (ref 0.350–4.500)

## 2020-05-20 IMAGING — DX DG CHEST 1V PORT
1 series · 1 of 1 positions shown · non-contrast
Comparison: Prior radiograph from [DATE].

CLINICAL DATA: Initial evaluation for acute shortness of breath.

EXAM:
PORTABLE CHEST 1 VIEW

[chest]
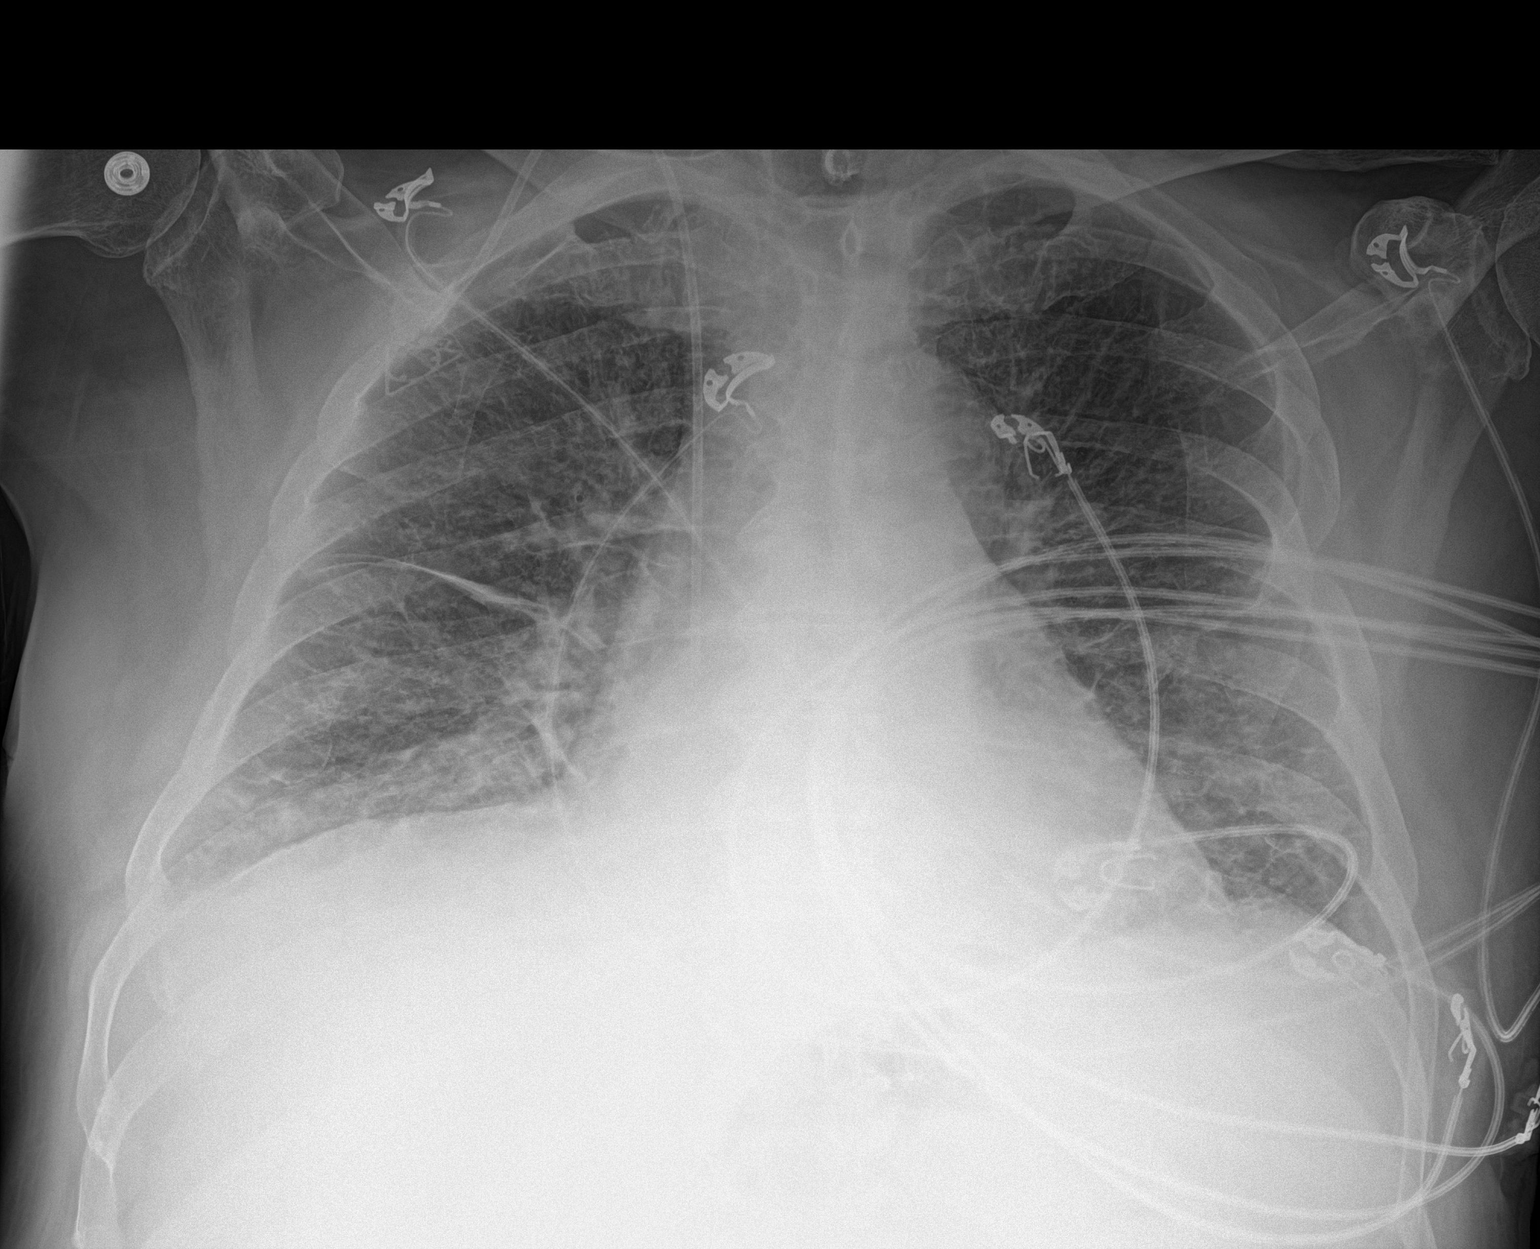

[1 of 1 positions shown; findings below may reference images not displayed]

FINDINGS: Right-sided Port-A-Cath in place with tip overlying the cavoatrial
junction. Mild cardiomegaly, stable. Mediastinal silhouette within
normal limits.

Lungs mildly hypoinflated. Diffuse vascular and interstitial
prominence compatible with pulmonary interstitial edema. Possible
trace layering fluid noted along the right minor fissure. Blunting
of the right costophrenic angle also suggestive of a small pleural
effusion. No consolidative airspace disease. No pneumothorax.

No acute osseous finding.
IMPRESSION: Cardiomegaly with mild diffuse pulmonary interstitial edema and
small right pleural effusion, suggesting CHF.

## 2020-05-20 MED ORDER — PERFLUTREN LIPID MICROSPHERE
1.0000 mL | INTRAVENOUS | Status: AC | PRN
  Administered 2020-05-20: 5 mL via INTRAVENOUS
  Filled 2020-05-20: qty 10

## 2020-05-20 MED ORDER — FUROSEMIDE 10 MG/ML IJ SOLN
40.0000 mg | Freq: Two times a day (BID) | INTRAMUSCULAR | Status: DC
  Administered 2020-05-20 – 2020-05-22 (×4): 40 mg via INTRAVENOUS
  Filled 2020-05-20 (×5): qty 4

## 2020-05-20 MED ORDER — PRAVASTATIN SODIUM 40 MG PO TABS
40.0000 mg | ORAL_TABLET | Freq: Every day | ORAL | Status: DC
  Administered 2020-05-20 – 2020-05-22 (×3): 40 mg via ORAL
  Filled 2020-05-20 (×3): qty 1

## 2020-05-20 MED ORDER — DRONEDARONE HCL 400 MG PO TABS
400.0000 mg | ORAL_TABLET | Freq: Two times a day (BID) | ORAL | Status: DC
  Administered 2020-05-20 – 2020-05-22 (×5): 400 mg via ORAL
  Filled 2020-05-20 (×6): qty 1

## 2020-05-20 MED ORDER — COLCHICINE 0.6 MG PO TABS
0.6000 mg | ORAL_TABLET | Freq: Every day | ORAL | Status: DC
  Administered 2020-05-20 – 2020-05-22 (×3): 0.6 mg via ORAL
  Filled 2020-05-20 (×3): qty 1

## 2020-05-20 MED ORDER — DILTIAZEM HCL ER COATED BEADS 180 MG PO CP24
180.0000 mg | ORAL_CAPSULE | Freq: Every day | ORAL | Status: DC
  Administered 2020-05-20 – 2020-05-22 (×3): 180 mg via ORAL
  Filled 2020-05-20 (×3): qty 1

## 2020-05-20 MED ORDER — FUROSEMIDE 10 MG/ML IJ SOLN
80.0000 mg | Freq: Once | INTRAMUSCULAR | Status: AC
  Administered 2020-05-20: 80 mg via INTRAVENOUS
  Filled 2020-05-20: qty 8

## 2020-05-20 MED ORDER — LISINOPRIL 20 MG PO TABS: 40.0000 mg | ORAL_TABLET | Freq: Every day | ORAL | Status: DC

## 2020-05-20 MED ORDER — CHLORHEXIDINE GLUCONATE CLOTH 2 % EX PADS
6.0000 | MEDICATED_PAD | Freq: Every day | CUTANEOUS | Status: DC
  Administered 2020-05-21: 6 via TOPICAL

## 2020-05-20 MED ORDER — ALLOPURINOL 100 MG PO TABS
100.0000 mg | ORAL_TABLET | Freq: Every day | ORAL | Status: DC
  Administered 2020-05-20 – 2020-05-22 (×3): 100 mg via ORAL
  Filled 2020-05-20 (×3): qty 1

## 2020-05-20 MED ORDER — INSULIN ASPART 100 UNIT/ML ~~LOC~~ SOLN
0.0000 [IU] | Freq: Three times a day (TID) | SUBCUTANEOUS | Status: DC
  Administered 2020-05-21: 1 [IU] via SUBCUTANEOUS

## 2020-05-20 MED ORDER — PANTOPRAZOLE SODIUM 40 MG PO TBEC
40.0000 mg | DELAYED_RELEASE_TABLET | Freq: Two times a day (BID) | ORAL | Status: DC
  Administered 2020-05-20 – 2020-05-22 (×5): 40 mg via ORAL
  Filled 2020-05-20 (×5): qty 1

## 2020-05-20 MED ORDER — APIXABAN 5 MG PO TABS
5.0000 mg | ORAL_TABLET | Freq: Two times a day (BID) | ORAL | Status: DC
  Administered 2020-05-20 – 2020-05-22 (×5): 5 mg via ORAL
  Filled 2020-05-20 (×7): qty 1

## 2020-05-20 MED ORDER — MAGNESIUM OXIDE 400 (241.3 MG) MG PO TABS
400.0000 mg | ORAL_TABLET | Freq: Every day | ORAL | Status: DC
  Administered 2020-05-20 – 2020-05-22 (×3): 400 mg via ORAL
  Filled 2020-05-20 (×3): qty 1

## 2020-05-20 MED ORDER — FENOFIBRATE 160 MG PO TABS
160.0000 mg | ORAL_TABLET | Freq: Every day | ORAL | Status: DC
  Administered 2020-05-20 – 2020-05-22 (×3): 160 mg via ORAL
  Filled 2020-05-20 (×3): qty 1

## 2020-05-20 MED ORDER — METOPROLOL SUCCINATE ER 25 MG PO TB24
25.0000 mg | ORAL_TABLET | Freq: Every day | ORAL | Status: DC
  Administered 2020-05-20 – 2020-05-22 (×3): 25 mg via ORAL
  Filled 2020-05-20 (×3): qty 1

## 2020-05-20 MED ORDER — FINASTERIDE 5 MG PO TABS
5.0000 mg | ORAL_TABLET | Freq: Every day | ORAL | Status: DC
  Administered 2020-05-20 – 2020-05-22 (×3): 5 mg via ORAL
  Filled 2020-05-20 (×3): qty 1

## 2020-05-20 MED ORDER — ONDANSETRON HCL 4 MG PO TABS: 4.0000 mg | ORAL_TABLET | Freq: Four times a day (QID) | ORAL | Status: DC | PRN

## 2020-05-20 MED ORDER — TAMSULOSIN HCL 0.4 MG PO CAPS
0.4000 mg | ORAL_CAPSULE | Freq: Every day | ORAL | Status: DC
  Administered 2020-05-20 – 2020-05-22 (×3): 0.4 mg via ORAL
  Filled 2020-05-20 (×3): qty 1

## 2020-05-20 MED ORDER — ONDANSETRON HCL 4 MG/2ML IJ SOLN: 4.0000 mg | Freq: Four times a day (QID) | INTRAMUSCULAR | Status: DC | PRN

## 2020-05-20 MED ORDER — PREDNISONE 20 MG PO TABS
40.0000 mg | ORAL_TABLET | Freq: Every day | ORAL | Status: DC
  Administered 2020-05-20 – 2020-05-22 (×3): 40 mg via ORAL
  Filled 2020-05-20 (×3): qty 2

## 2020-05-20 NOTE — Progress Notes (Addendum)
[facility]                                                                              Patient Demographics  Brandon Sherman, is a 74 y.o. male, DOB - 07-Jul-1946, ESP:[id number]  Admit date - 05/20/2020   Admitting Physician Rise Patience, MD  Outpatient Primary MD for the patient is [name], PA  Outpatient specialists:   LOS - 0  days   Medical records reviewed and are as summarized below:    Chief Complaint  Patient presents with  . Shortness of Breath       Brief summary   Brandon Sherman is a 74 y.o. male with history of recently diagnosed A. fib but on 3 months ago when patient was having a Port-A-Cath placement for cholangiocarcinoma had last chemotherapy 2 weeks ago had cardioversion a day prior to admission on 6/30 following which patient went home started getting some cough and went was about lying on the bed became acutely short of breath and was brought to the ER.  Denied any chest pain.  Denied fever or chills.  Patient was having bilateral lower extremity edema and was taking Lasix every other day.  In ED, patient was placed on 4 L O2 (per patient, not on home O2), chest x-ray showed pulmonary edema, given Lasix 80 mg IV x1.  Creatinine 1.6, hemoglobin 9.5, BNP 428.  Patient was admitted for acute CHF   Assessment & Plan    Principal Problem:   Acute diastolic CHF (congestive heart failure) ([place]), acute respiratory failure with hypoxia -Presenting with elevated BNP, pulmonary edema on chest x-ray, acute shortness of breath with lower extremity edema -2D echo in May/2021 showed EF of 60-65%.  Follow repeat echo today -Patient received Lasix 80 mg IV x1, subsequently placed on 40 mg IV every 12 hours -Continue strict I's and O's and daily weights, hold ACE inhibitor -D-dimer 1.6, creatinine trended up to 1.6, patient has been on Eliquis, less likelihood of PE however possibility due to history of malignancy, hypercoagulable  state -Appreciate cardiology following -Patient on no O2 at home, currently on 3 L O2 via nasal cannula, wean O2 as tolerated with diuresis -Home O2 evaluation prior to discharge  Active Problems: Left foot GOUT flare -Patient reports has history of gout, has flare in left foot/ankle -Unable to place on NSAIDs due to creatinine function trending up. -Will place on colchicine and prednisone, continue allopurinol    Essential hypertension -BP currently stable, continue beta-blocker, Cardizem, holding lisinopril due to AKI    Intrahepatic cholangiocarcinoma (HCC) -Followed by Dr. Burr Medico, received chemotherapy 2 weeks ago    Atrial fibrillation ([place]), paroxysmal -Underwent recent cardioversion on 05/19/2020 -Cardiology following, presently in sinus rhythm,  - on Eliquis for anticoagulation, on Cardizem, beta-blocker, Multaq  Diabetes mellitus type 2 -Continue sliding scale insulin while inpatient   Chronic anemia, thrombocytopenia -Likely secondary to chemotherapy, baseline H&H 9-10 -Currently 9.0, follow counts  Acute kidney injury -Creatinine 1.6, likely due to lisinopril, diuresis, acute diastolic CHF with decreased renal perfusion -Follow closely with diuresis  Obesity Estimated body mass index is 32.78 kg/m as  calculated from the following:   Height as of this encounter: 5\' 9"  (1.753 m).   Weight as of this encounter: 100.7 kg.  Code Status: Full CODE STATUS DVT Prophylaxis: Eliquis Family Communication: Discussed all imaging results, lab results, explained to the patient. Called patient's wife however was not able to make contact, left a detailed message on the phone.   Disposition Plan:     Status is: Inpatient  Remains inpatient appropriate because:Inpatient level of care appropriate due to severity of illness   Dispo: The patient is from: Home              Anticipated d/c is to: Home              Anticipated d/c date is: 2 days              Patient currently is  not medically stable to d/c.      Time Spent in minutes: 55mins patient seen and examined in ED, chart reviewed, plan discussed with patient   Procedures:  2D echo  Consultants:   Cardiology  Antimicrobials:   Anti-infectives (From admission, onward)   None          Medications  Scheduled Meds: . allopurinol  100 mg Oral Daily  . apixaban  5 mg Oral BID  . diltiazem  180 mg Oral Daily  . dronedarone  400 mg Oral BID WC  . fenofibrate  160 mg Oral Daily  . finasteride  5 mg Oral Daily  . furosemide  40 mg Intravenous Q12H  . insulin aspart  0-9 Units Subcutaneous TID WC  . magnesium oxide  400 mg Oral Daily  . metoprolol succinate  25 mg Oral Daily  . pantoprazole  40 mg Oral BID  . pravastatin  40 mg Oral Daily  . tamsulosin  0.4 mg Oral Daily   Continuous Infusions: PRN Meds:.ondansetron **OR** ondansetron (ZOFRAN) IV      Subjective:   Brandon Sherman was seen and examined today.  Somewhat improving however still short of breath, on 3 L O2 via nasal cannula (not on any home O2 prior to admission).  No chest pain. Patient denies dizziness,  abdominal pain, N/V/D/C, new weakness, numbess, tingling.  Left foot/ankle pain.  Objective:   Vitals:   05/20/20 0810 05/20/20 0902 05/20/20 0917 05/20/20 1030  BP:  (!) 145/83  (!) 157/86  Pulse: 79 73 76 73  Resp: (!) 23 19 (!) 22 17  Temp:      TempSrc:      SpO2: 93% 90% 93% 99%  Weight:      Height:       No intake or output data in the 24 hours ending 05/20/20 1121   Wt Readings from Last 3 Encounters:  05/20/20 100.7 kg  05/19/20 100.5 kg  05/12/20 100.5 kg     Exam  General: Alert and oriented x 3, NAD   Neck: JVD elevated  Cardiovascular: S1 S2 auscultated, no murmurs, RRR  Respiratory: Clear to auscultation bilaterally, no wheezing, rales or rhonchi  Gastrointestinal: Soft, nontender, nondistended, + bowel sounds  Ext: 2+ pitting pedal edema bilaterally  Neuro: Strength 5/5 in  upper and lower extremities bilaterally  Musculoskeletal: No digital cyanosis, clubbing.  Left foot erythema  Skin: No rashes  Psych: Normal affect and demeanor, alert and oriented x3    Data Reviewed:  I have personally reviewed following labs and imaging studies  Micro Results Recent Results (from the past 240 hour(s))  SARS CORONAVIRUS 2 (TAT 6-24 HRS) Nasopharyngeal Nasopharyngeal Swab     Status: None   Collection Time: 05/15/20 11:00 AM   Specimen: Nasopharyngeal Swab  Result Value Ref Range Status   SARS Coronavirus 2 NEGATIVE NEGATIVE Final    Comment: (NOTE) SARS-CoV-2 target nucleic acids are NOT DETECTED.  The SARS-CoV-2 RNA is generally detectable in upper and lower respiratory specimens during the acute phase of infection. Negative results do not preclude SARS-CoV-2 infection, do not rule out co-infections with other pathogens, and should not be used as the sole basis for treatment or other patient management decisions. Negative results must be combined with clinical observations, patient history, and epidemiological information. The expected result is Negative.  Fact Sheet for Patients: SugarRoll.be  Fact Sheet for Healthcare Providers: https://www.woods-mathews.com/  This test is not yet approved or cleared by the [place] FDA and  has been authorized for detection and/or diagnosis of SARS-CoV-2 by FDA under an Emergency Use Authorization (EUA). This EUA will remain  in effect (meaning this test can be used) for the duration of the COVID-19 declaration under Se ction 564(b)(1) of the Act, 21 U.S.C. section 360bbb-3(b)(1), unless the authorization is terminated or revoked sooner.  Performed at [facility], [place] [address], [place], [place] [address]   SARS Coronavirus 2 by RT PCR (hospital order, performed in [facility] hospital lab) Nasopharyngeal Nasopharyngeal Swab     Status: None   Collection Time:  05/20/20  1:56 AM   Specimen: Nasopharyngeal Swab  Result Value Ref Range Status   SARS Coronavirus 2 NEGATIVE NEGATIVE Final    Comment: (NOTE) SARS-CoV-2 target nucleic acids are NOT DETECTED.  The SARS-CoV-2 RNA is generally detectable in upper and lower respiratory specimens during the acute phase of infection. The lowest concentration of SARS-CoV-2 viral copies this assay can detect is 250 copies / mL. A negative result does not preclude SARS-CoV-2 infection and should not be used as the sole basis for treatment or other patient management decisions.  A negative result may occur with improper specimen collection / handling, submission of specimen other than nasopharyngeal swab, presence of viral mutation(s) within the areas targeted by this assay, and inadequate number of viral copies (<250 copies / mL). A negative result must be combined with clinical observations, patient history, and epidemiological information.  Fact Sheet for Patients:   StrictlyIdeas.no  Fact Sheet for Healthcare Providers: BankingDealers.co.za  This test is not yet approved or  cleared by the [place] FDA and has been authorized for detection and/or diagnosis of SARS-CoV-2 by FDA under an Emergency Use Authorization (EUA).  This EUA will remain in effect (meaning this test can be used) for the duration of the COVID-19 declaration under Section 564(b)(1) of the Act, 21 U.S.C. section 360bbb-3(b)(1), unless the authorization is terminated or revoked sooner.  Performed at [facility], [place] [address], [place], [place] [address]     Radiology Reports DG Chest [place] 1 View  Result Date: 05/20/2020 CLINICAL DATA:  Initial evaluation for acute shortness of breath. EXAM: PORTABLE CHEST 1 VIEW COMPARISON:  Prior radiograph from 03/23/2020. FINDINGS: Right-sided Port-A-Cath in place with tip overlying the cavoatrial junction. Mild cardiomegaly, stable.  Mediastinal silhouette within normal limits. Lungs mildly hypoinflated. Diffuse vascular and interstitial prominence compatible with pulmonary interstitial edema. Possible trace layering fluid noted along the right minor fissure. Blunting of the right costophrenic angle also suggestive of a small pleural effusion. No consolidative airspace disease. No pneumothorax. No acute osseous finding.  IMPRESSION: Cardiomegaly with mild diffuse pulmonary interstitial edema and small right pleural effusion, suggesting CHF. Electronically Signed   By: Jeannine Boga M.D.   On: 05/20/2020 02:15   Myocardial Perfusion Imaging  Result Date: 05/05/2020  The left ventricular ejection fraction is normal (55-65%).  Nuclear stress EF: 63%.  There was no ST segment deviation noted during stress.  The study is normal.  This is a low risk study.  Normal pharmacologic nuclear study with no evidence for prior infarct or ischemia. Normal LVEF.    Lab Data:  CBC: Recent Labs  Lab 05/20/20 0133  WBC 6.8  NEUTROABS 5.1  HGB 9.5*  HCT 29.4*  MCV 112.6*  PLT 86*   Basic Metabolic Panel: Recent Labs  Lab 05/20/20 0133 05/20/20 0227  NA 139  --   K 3.9  --   CL 109  --   CO2 21*  --   GLUCOSE 156*  --   BUN 19  --   CREATININE 1.63*  --   CALCIUM 9.1  --   MG  --  1.7   GFR: Estimated Creatinine Clearance: 47.9 mL/min (A) (by C-G formula based on SCr of 1.63 mg/dL (H)). Liver Function Tests: Recent Labs  Lab 05/20/20 0133  AST 23  ALT 17  ALKPHOS 42  BILITOT 0.8  PROT 6.2*  ALBUMIN 3.6   No results for input(s): LIPASE, AMYLASE in the last 168 hours. No results for input(s): AMMONIA in the last 168 hours. Coagulation Profile: No results for input(s): INR, PROTIME in the last 168 hours. Cardiac Enzymes: No results for input(s): CKTOTAL, CKMB, CKMBINDEX, TROPONINI in the last 168 hours. BNP (last 3 results) No results for input(s): PROBNP in the last 8760 hours. HbA1C: No results for  input(s): HGBA1C in the last 72 hours. CBG: Recent Labs  Lab 05/19/20 1104 05/20/20 0938  GLUCAP 85 82   Lipid Profile: No results for input(s): CHOL, HDL, LDLCALC, TRIG, CHOLHDL, LDLDIRECT in the last 72 hours. Thyroid Function Tests: Recent Labs    05/20/20 0804  TSH 2.131   Anemia Panel: No results for input(s): VITAMINB12, FOLATE, FERRITIN, TIBC, IRON, RETICCTPCT in the last 72 hours. Urine analysis:    Component Value Date/Time   COLORURINE YELLOW 03/15/2020 1942   APPEARANCEUR CLEAR 03/15/2020 1942   LABSPEC 1.010 03/15/2020 1942   PHURINE 7.0 03/15/2020 1942   GLUCOSEU NEGATIVE 03/15/2020 1942   HGBUR SMALL (A) 03/15/2020 1942   BILIRUBINUR NEGATIVE 03/15/2020 1942   KETONESUR NEGATIVE 03/15/2020 1942   PROTEINUR NEGATIVE 03/15/2020 1942   NITRITE NEGATIVE 03/15/2020 1942   LEUKOCYTESUR NEGATIVE 03/15/2020 1942     Brandon Sherman M.D. [facility] 05/20/2020, 11:21 AM   Call night coverage person covering after 7pm

## 2020-05-20 NOTE — Consult Note (Addendum)
Cardiology Consultation:   Patient ID: Brandon Sherman MRN: [id number]; DOB: 10/17/1946  Admit date: 05/20/2020 Date of Consult: 05/20/2020  Primary Care Provider: [name], PA [facility] Cardiologist: No primary care provider on file.  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    History of Present Illness:   Brandon Sherman reports that around 2100 he was getting ready to go to bed and had a HA, took 2 tylenol then laid down and felt that he couldn't breath. Had acute onset SOB, felt like "his lungs filled up with fluid." He had been coughing all evening. The SOB was related to when he laid down. He sat up in recliner but didn't feel significant improvement. He had productive cough of yellow sputum along with small amount of blood tinged nasal secretions when he blew his nose. He has been on eliquis for the past 3 mo when he was found to be in AF when having port placed for chemotx. He denies any missed doses of his eliquis. Brandon Sherman has had orthopnea for several months, him and his wife think this has been in the same time frame as AF. He is on lasix 20 mg PO every other day for the past few months with minimal improvement in LE edema. He feels like he can not get a deep breath when he tries, not limited by pain but just can' get the air movement. He has noticed 10 lb wt gain over the past few months however this was in the setting of increasing his diet with his oncologist telling him he was losing wt during his cancer tx. Brandon Sherman and his wife say that over the past few months his LE swelling has gotten worse and that it always favors his RLE more than his LLE. He has had some degree of LE swelling for over a year.   Past Medical History:  Diagnosis Date   Arthritis    Diabetes  ([place])    GERD (gastroesophageal reflux disease)    Hyperlipidemia    Hypertension    Intrahepatic cholangiocarcinoma ([place])    Past Surgical History:  Procedure Laterality Date   APPENDECTOMY  1980   CARDIOVERSION N/A 04/27/2020   Procedure: CARDIOVERSION;  Surgeon: Dorothy Spark, MD;  Location: [facility] ENDOSCOPY;  Service: Cardiovascular;  Laterality: N/A;   COLONOSCOPY     IR IMAGING GUIDED PORT INSERTION  02/23/2020    Home Medications:  Prior to Admission medications   Medication Sig Start Date End Date Taking? Authorizing Provider  allopurinol (ZYLOPRIM) 100 MG tablet Take 100 mg by mouth daily.    [provider]  apixaban (ELIQUIS) 5 MG TABS tablet Take 1 tablet (5 mg total) by mouth 2 (two) times daily. 03/24/20   Lorretta Harp, MD  diltiazem (CARDIZEM CD) 180 MG 24 hr capsule Take 1 capsule (180 mg total) by mouth daily. 05/11/20 08/09/20  Lorretta Harp, MD  dronedarone (MULTAQ) 400 MG tablet Take 1 tablet (400 mg total) by mouth 2 (two) times daily with a meal. 05/16/20   Fenton, Clint R, PA  fenofibrate (TRICOR) 145 MG tablet Take 145 mg by mouth daily.    [provider]  finasteride (PROSCAR) 5 MG tablet Take 5 mg by mouth daily.    [provider]  furosemide (LASIX) 20 MG tablet Take 1 tablet (20  mg total) by mouth every other day. 03/24/20   Lorretta Harp, MD  lidocaine-prilocaine (EMLA) cream Apply 1 application topically as needed. 03/29/20   Alla Feeling, NP  lisinopril (ZESTRIL) 40 MG tablet Take 1 tablet (40 mg total) by mouth daily. 05/11/20   Lorretta Harp, MD  magnesium oxide (MAG-OX) 400 MG tablet TAKE 1 TABLET BY MOUTH EVERY DAY Patient taking differently: Take 400 mg by mouth daily.  05/10/20   Alla Feeling, NP  metFORMIN (GLUCOPHAGE) 500 MG tablet Take 500 mg by mouth 2 (two) times daily with a meal.     [provider]  metoprolol succinate (TOPROL-XL) 25 MG 24 hr tablet Take 1 tablet (25 mg total) by mouth  daily. 04/15/20   Lorretta Harp, MD  ondansetron (ZOFRAN) 8 MG tablet Take 1 tablet (8 mg total) by mouth 2 (two) times daily as needed. Start on the third day after chemotherapy. 05/03/20   [name], MD  pantoprazole (PROTONIX) 40 MG tablet Take 1 tablet (40 mg total) by mouth 2 (two) times daily. 05/03/20   [name], MD  pravastatin (PRAVACHOL) 40 MG tablet Take 40 mg by mouth daily.    [provider]  sucralfate (CARAFATE) 1 g tablet TAKE 1 TABLET BY MOUTH EVERY 6 HOURS AS NEEDED. 05/06/20   [name], [name], MD  tamsulosin (FLOMAX) 0.4 MG CAPS capsule Take 0.4 mg by mouth daily.    [provider]  Vitamin D, Ergocalciferol, (DRISDOL) 1.25 MG (50000 UT) CAPS capsule Take 50,000 Units by mouth every 7 (seven) days.    [provider]   Inpatient Medications: Scheduled Meds:  Continuous Infusions:  PRN Meds:  Allergies:   No Known Allergies  Social History:   Social History   Socioeconomic History   Marital status: Married    Spouse name: Not on file   Number of children: 3   Years of education: Not on file   Highest education level: Not on file  Occupational History   Not on file  Tobacco Use   Smoking status: Never Smoker   Smokeless tobacco: Current User    Types: Chew   Tobacco comment: for 60 years, 1-2 daily   Vaping Use   Vaping Use: Never used  Substance and Sexual Activity   Alcohol use: Not Currently    Comment: occasionally   Drug use: Never   Sexual activity: Not on file  Other Topics Concern   Not on file  Social History Narrative   Not on file   Social Determinants of Health   Financial Resource Strain:    Difficulty of Paying Living Expenses:   Food Insecurity:    Worried About Charity fundraiser in the Last Year:    Arboriculturist in the Last Year:   Transportation Needs:    Film/video editor (Medical):    Lack of Transportation (Non-Medical):   Physical Activity:    Days of Exercise  per Week:    Minutes of Exercise per Session:   Stress:    Feeling of Stress :   Social Connections:    Frequency of Communication with Friends and Family:    Frequency of Social Gatherings with Friends and Family:    Attends Religious Services:    Active Member of Clubs or Organizations:    Attends Archivist Meetings:    Marital Status:   Intimate Partner Violence:    Fear of Current or Ex-Partner:  Emotionally Abused:    Physically Abused:    Sexually Abused:     Family History:   Family History  Problem Relation Age of Onset   Heart attack Mother    Stroke Father    Colon cancer Neg Hx    Esophageal cancer Neg Hx    Stomach cancer Neg Hx    Rectal cancer Neg Hx    Colon polyps Neg Hx     ROS:  Review of Systems: [y] = yes, [ ]  = no       General: Weight gain [y]; Weight loss [ ] ; Anorexia [ ] ; Fatigue [y]; Fever [ ] ; Chills [ ] ; Weakness [ ]     Cardiac: Chest pain/pressure [ ] ; Resting SOB [y]; Exertional SOB [y]; Orthopnea [y]; Pedal Edema [y]; Palpitations [ ] ; Syncope [ ] ; Presyncope [ ] ; Paroxysmal nocturnal dyspnea [ ]     Pulmonary: Cough [ ] ; Wheezing [ ] ; Hemoptysis [ ] ; Sputum [ ] ; Snoring [ ]     GI: Vomiting [ ] ; Dysphagia [ ] ; Melena [ ] ; Hematochezia [ ] ; Heartburn [ ] ; Abdominal pain [ ] ; Constipation [ ] ; Diarrhea [ ] ; BRBPR [ ]     GU: Hematuria [ ] ; Dysuria [ ] ; Nocturia [ ]   Vascular: Pain in legs with walking [ ] ; Pain in feet with lying flat [ ] ; Non-healing sores [ ] ; Stroke [ ] ; TIA [ ] ; Slurred speech [ ] ;    Neuro: Headaches [ ] ; Vertigo [ ] ; Seizures [ ] ; Paresthesias [ ] ;Blurred vision [ ] ; Diplopia [ ] ; Vision changes [ ]     Ortho/Skin: Arthritis [ ] ; Joint pain [ ] ; Muscle pain [ ] ; Joint swelling [ ] ; Back Pain [ ] ; Rash [ ]     Psych: Depression [ ] ; Anxiety [ ]     Heme: Bleeding problems [ ] ; Clotting disorders [ ] ; Anemia [ ]     Endocrine: Diabetes [ ] ; Thyroid dysfunction [ ]    Physical Exam/Data:    Vitals:   05/20/20 0445 05/20/20 0449 05/20/20 0545 05/20/20 0556  BP:  (!) 158/94  139/77  Pulse: 89 89  78  Resp: (!) 28 17  (!) 21  Temp:      TempSrc:      SpO2: 90% 92% 93% 96%  Weight:      Height:       No intake or output data in the 24 hours ending 05/20/20 0626 Last 3 Weights 05/20/2020 05/19/2020 05/12/2020  Weight (lbs) 222 lb 221 lb 9.6 oz 221 lb 9.6 oz  Weight (kg) 100.699 kg 100.517 kg 100.517 kg     Body mass index is 32.78 kg/m.  General:  Well nourished, well developed, in no acute distress HEENT: normal Lymph: no adenopathy Neck: no JVD Endocrine:  No thryomegaly Vascular: No carotid bruits; FA pulses 2+ bilaterally without bruits  Cardiac:  normal S1, S2; RRR; no murmur  Lungs: mild b/l lower lung field crackles Abd: soft, nontender, no hepatomegaly  Ext: 3+ pitting edema b/l, pedal edema more significant in RLE Musculoskeletal:  No deformities, BUE and BLE strength normal and equal Skin: warm and dry  Neuro:  CNs 2-12 intact, no focal abnormalities noted Psych:  Normal affect   EKG:  The EKG was personally reviewed and demonstrates: NSR Telemetry:  Telemetry was personally reviewed and demonstrates: NSR  Relevant CV Studies:  Cardiac calcium score Result date: 04/13/20 931 (93 percentile)   TTE  Result date: 04/13/20 LVEF 60-65%, LV normal fxn, no regional WMA,  mild LVH, in AF, RV normal, RV systolic fxn normal, mildly elevated PAP (RVSP 40.5 mmHg), LA size normal, RA size normal, no pericardial effusion, mild MR, triv TR, mild aortic root dilatation (41 mm)  Laboratory Data:  High Sensitivity Troponin:   Recent Labs  Lab 05/20/20 0133 05/20/20 0227  TROPONINIHS 42* 40*     Chemistry Recent Labs  Lab 05/20/20 0133  NA 139  K 3.9  CL 109  CO2 21*  GLUCOSE 156*  BUN 19  CREATININE 1.63*  CALCIUM 9.1  GFRNONAA 41*  GFRAA 48*  ANIONGAP 9    Recent Labs  Lab 05/20/20 0133  PROT 6.2*  ALBUMIN 3.6  AST 23  ALT 17  ALKPHOS 42   BILITOT 0.8   Hematology Recent Labs  Lab 05/20/20 0133  WBC 6.8  RBC 2.61*  HGB 9.5*  HCT 29.4*  MCV 112.6*  MCH 36.4*  MCHC 32.3  RDW 20.0*  PLT 86*   BNP Recent Labs  Lab 05/20/20 0133  BNP 428.4*    DDimer No results for input(s): DDIMER in the last 168 hours.  Radiology/Studies:  DG Chest Port 1 View  Result Date: 05/20/2020 CLINICAL DATA:  Initial evaluation for acute shortness of breath. EXAM: PORTABLE CHEST 1 VIEW COMPARISON:  Prior radiograph from 03/23/2020. FINDINGS: Right-sided Port-A-Cath in place with tip overlying the cavoatrial junction. Mild cardiomegaly, stable. Mediastinal silhouette within normal limits. Lungs mildly hypoinflated. Diffuse vascular and interstitial prominence compatible with pulmonary interstitial edema. Possible trace layering fluid noted along the right minor fissure. Blunting of the right costophrenic angle also suggestive of a small pleural effusion. No consolidative airspace disease. No pneumothorax. No acute osseous finding. IMPRESSION: Cardiomegaly with mild diffuse pulmonary interstitial edema and small right pleural effusion, suggesting CHF. Electronically Signed   By: Jeannine Boga M.D.   On: 05/20/2020 02:15   Assessment and Plan:   1. SOB Brandon Sherman presents with acute onset SOB since last night. He had successful DCCV yesterday. He has been on eliquis uninterrupted for 3 mo without missed doses. Labwork revealed mildly elevated troponin without delta. (42->40). Albumin (3.6) and BNP stable from prior (428 from 546). He was initially on 4 L [place] which I weaned to RA with sats >91% during our conversation. He seemed to feel much better s/p lasix 80 mg IV (on every other day lasix at home). Hb and CMP stable. He was in NSR in the 70s with HTN (140-170/60-100). CXR with pulmonary edema however recent TTE with preserved systolic fxn and no diastolic dysfunction. He may have component of HFpEF driving his sx. Other considerations would  be PE (although on eliquis he does have asx pedal edema and active malignancy) however less likely given improvement with single dose lasix along with no trop delta and stable BNP. He seems generally deconditioned. He is currently only on monotherapy gemcitabine and was taken of cisplatin back in 03/2020 after he was found to have AF. Cisplatin has only case reports of cardiotoxicity and gemcitabine is generally not associated with cardiotoxicity. Covid negative.  - repeat echo to ensure no signficatn change from recent prior - continue prn lasix for sx relief - if continued need for O2 despite diuresis consider CTPE given RFs  For questions or updates, please contact [place] Please consult [email or web address] for contact info under   Signed, Brandon Body, MD  05/20/2020 6:26 AM

## 2020-05-20 NOTE — Progress Notes (Signed)
  Echocardiogram 2D Echocardiogram has been performed with Definity.  Brandon Sherman 05/20/2020, 12:04 PM

## 2020-05-20 NOTE — Progress Notes (Addendum)
Progress Note  Patient Name: Brandon Sherman Date of Encounter: 05/20/2020  Primary Cardiologist:  Quay Burow, MD  Subjective   Breathing better than yesterday, but still SOB No chest pain Has gout L foot/ankle, has a flare now, but has had gout > 10 yr  Inpatient Medications    Scheduled Meds: . allopurinol  100 mg Oral Daily  . apixaban  5 mg Oral BID  . diltiazem  180 mg Oral Daily  . dronedarone  400 mg Oral BID WC  . fenofibrate  160 mg Oral Daily  . finasteride  5 mg Oral Daily  . furosemide  40 mg Intravenous Q12H  . insulin aspart  0-9 Units Subcutaneous TID WC  . magnesium oxide  400 mg Oral Daily  . metoprolol succinate  25 mg Oral Daily  . pantoprazole  40 mg Oral BID  . pravastatin  40 mg Oral Daily  . tamsulosin  0.4 mg Oral Daily   Continuous Infusions:  PRN Meds: ondansetron **OR** ondansetron (ZOFRAN) IV   Vital Signs    Vitals:   05/20/20 0545 05/20/20 0556 05/20/20 0800 05/20/20 0810  BP:  139/77 119/67   Pulse:  78 79 79  Resp:  (!) 21 19 (!) 23  Temp:      TempSrc:      SpO2: 93% 96% 90% 93%  Weight:      Height:       No intake or output data in the 24 hours ending 05/20/20 0827 Filed Weights   05/20/20 0132  Weight: 100.7 kg   Last Weight  Most recent update: 05/20/2020  1:33 AM   Weight  100.7 kg (222 lb)           Weight change:    Telemetry    SR, regular dropped PACs, no pauses > 2 sec - Personally Reviewed  ECG    07/01 ECG is SR, HR 91, no acute ischemic changes - Personally Reviewed  Physical Exam   General: Well developed, well nourished, male appearing in no acute distress. Head: Normocephalic, atraumatic.  Neck: Supple without bruits, JVD 9-10 cm. Lungs:  Resp regular and unlabored, bibasilar rales. Heart: RRR, S1, S2, no S3, S4, or murmur; no rub. Abdomen: Soft, non-tender, non-distended with normoactive bowel sounds. No hepatomegaly. No rebound/guarding. No obvious abdominal masses. Extremities: No  clubbing, cyanosis, 2-3+ LE edema. Distal radial pulses are 2+ bilaterally. +erythema L foot.  Neuro: Alert and oriented X 3. Moves all extremities spontaneously. Psych: Normal affect.  Labs    Hematology Recent Labs  Lab 05/20/20 0133  WBC 6.8  RBC 2.61*  HGB 9.5*  HCT 29.4*  MCV 112.6*  MCH 36.4*  MCHC 32.3  RDW 20.0*  PLT 86*    Chemistry Recent Labs  Lab 05/20/20 0133  NA 139  K 3.9  CL 109  CO2 21*  GLUCOSE 156*  BUN 19  CREATININE 1.63*  CALCIUM 9.1  PROT 6.2*  ALBUMIN 3.6  AST 23  ALT 17  ALKPHOS 42  BILITOT 0.8  GFRNONAA 41*  GFRAA 48*  ANIONGAP 9     High Sensitivity Troponin:   Recent Labs  Lab 05/20/20 0133 05/20/20 0227  TROPONINIHS 42* 40*      BNP Recent Labs  Lab 05/20/20 0133  BNP 428.4*    No results found for: TSH No results found for: HGBA1C Lab Results  Component Value Date   CHOL 125 03/24/2020   HDL 40 03/24/2020   LDLCALC 67  03/24/2020   TRIG 95 03/24/2020   CHOLHDL 3.1 03/24/2020     Radiology    DG Chest Port 1 View  Result Date: 05/20/2020 CLINICAL DATA:  Initial evaluation for acute shortness of breath. EXAM: PORTABLE CHEST 1 VIEW COMPARISON:  Prior radiograph from 03/23/2020. FINDINGS: Right-sided Port-A-Cath in place with tip overlying the cavoatrial junction. Mild cardiomegaly, stable. Mediastinal silhouette within normal limits. Lungs mildly hypoinflated. Diffuse vascular and interstitial prominence compatible with pulmonary interstitial edema. Possible trace layering fluid noted along the right minor fissure. Blunting of the right costophrenic angle also suggestive of a small pleural effusion. No consolidative airspace disease. No pneumothorax. No acute osseous finding. IMPRESSION: Cardiomegaly with mild diffuse pulmonary interstitial edema and small right pleural effusion, suggesting CHF. Electronically Signed   By: Jeannine Boga M.D.   On: 05/20/2020 02:15     Cardiac Studies   ECHO: 04/13/2020   1. Definity used; normal LV systolic function; mild LVH; mildly dilated  aortic root; mild AI; mild MR.  2. Left ventricular ejection fraction, by estimation, is 60 to 65%. The  left ventricle has normal function. The left ventricle has no regional  wall motion abnormalities. There is mild left ventricular hypertrophy.  Left ventricular diastolic function could not be evaluated.  3. Right ventricular systolic function is normal. The right ventricular  size is normal. There is mildly elevated pulmonary artery systolic  pressure.  4. The mitral valve is normal in structure. Mild mitral valve  regurgitation. No evidence of mitral stenosis.  5. The aortic valve is tricuspid. Aortic valve regurgitation is mild. No  aortic stenosis is present.  6. Aortic dilatation noted. There is mild dilatation of the aortic root  measuring 41 mm.  7. The inferior vena cava is normal in size with greater than 50%  respiratory variability, suggesting right atrial pressure of 3 mmHg.   Patient Profile     74 y.o. 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  Assessment & Plan    1. Acute diastolic CHF - diffuse CHF on CXR - Cr higher today, baseline was about 1.0 until 2021, then approx 1.3 till today - needs significant diuresis - needs I/O and daily wts, wt this am 222 lbs, I/O not recorded  Otherwise, per IM Principal Problem:   Acute diastolic CHF (congestive heart failure) (HCC) Active Problems:   GOUT   Essential hypertension   Intrahepatic cholangiocarcinoma (HCC)   Atrial fibrillation (HCC)   Coronary artery calcification seen on CT scan   Acute CHF (congestive heart failure) ([place])  Signed, Rosaria Ferries , PA-C 8:27 AM 05/20/2020 Pager: [phone] As above, patient seen and examined.  Patient admitted with dyspnea, orthopnea and  pedal edema.  No chest pain.  Volume overloaded on exam.  Continue diuresis.  Follow renal function.  We will repeat limited echocardiogram to rule out pericardial effusion given history of malignancy.  Note he remains in sinus rhythm status post cardioversion.  Kirk Ruths, MD

## 2020-05-20 NOTE — ED Provider Notes (Signed)
[facility] EMERGENCY DEPARTMENT Provider Note   CSN: [id number] Arrival date & time: 05/20/20  0123     History Chief Complaint  Patient presents with  . Shortness of Breath    Brandon Sherman is a 74 y.o. male.  Patient presents to the emergency department for evaluation of shortness of breath. Patient reports that his shortness of breath began approximately 2 hours before coming to the ER. Patient is brought from home by EMS. EMS identified room air oxygen saturation of 82%, placed him on oxygen. He does not normally use oxygen. Patient is not experiencing any associated chest pain.        Past Medical History:  Diagnosis Date  . Arthritis   . Diabetes ([place])   . GERD (gastroesophageal reflux disease)   . Hyperlipidemia   . Hypertension   . 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 Secondary hypercoagulable state ([place]) 05/12/2020  . Atrial fibrillation ([place]) 03/24/2020  . Dyspnea on exertion 03/24/2020  . Coronary artery calcification seen on CT scan 03/24/2020  . Port-A-Cath in place 03/01/2020  . Goals of care, counseling/discussion 09/22/2019  . 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 Abdominal pain, epigastric 07/16/2019  . Nausea and vomiting 07/16/2019  . Abdominal pain 07/16/2019  . Loss of weight 07/16/2019  . HYPERLIPIDEMIA 07/12/2009  . GOUT 07/12/2009  . HYPERTENSION 07/12/2009  . ARTHRITIS 07/12/2009    Past Surgical History:  Procedure Laterality Date  . APPENDECTOMY  1980  . CARDIOVERSION N/A 04/27/2020   Procedure: CARDIOVERSION;  Surgeon: Dorothy Spark, MD;  Location: [facility] ENDOSCOPY;  Service: Cardiovascular;  Laterality: N/A;  . COLONOSCOPY    . IR IMAGING GUIDED PORT INSERTION  02/23/2020       Family History  Problem Relation Age of Onset  . Heart attack Mother   . Stroke Father   . Colon cancer Neg Hx   . Esophageal cancer Neg Hx   . Stomach  cancer Neg Hx   . Rectal cancer Neg Hx   . Colon polyps Neg Hx     Social History   Tobacco Use  . Smoking status: Never Smoker  . Smokeless tobacco: Current User    Types: Chew  . Tobacco comment: for 60 years, 1-2 daily   Vaping Use  . Vaping Use: Never used  Substance Use Topics  . Alcohol use: Not Currently    Comment: occasionally  . Drug use: Never    Home Medications Prior to Admission medications   Medication Sig Start Date End Date Taking? Authorizing Provider  allopurinol (ZYLOPRIM) 100 MG tablet Take 100 mg by mouth daily.    [provider]  apixaban (ELIQUIS) 5 MG TABS tablet Take 1 tablet (5 mg total) by mouth 2 (two) times daily. 03/24/20   Lorretta Harp, MD  diltiazem (CARDIZEM CD) 180 MG 24 hr capsule Take 1 capsule (180 mg total) by mouth daily. 05/11/20 08/09/20  Lorretta Harp, MD  dronedarone (MULTAQ) 400 MG tablet Take 1 tablet (400 mg total) by mouth 2 (two) times daily with a meal. 05/16/20   Fenton, Clint R, PA  fenofibrate (TRICOR) 145 MG tablet Take 145 mg by mouth daily.    [provider]  finasteride (PROSCAR) 5 MG tablet Take 5 mg by mouth daily.    [provider]  furosemide (LASIX) 20 MG tablet Take 1 tablet (20 mg total) by mouth every other day.  03/24/20   Lorretta Harp, MD  lidocaine-prilocaine (EMLA) cream Apply 1 application topically as needed. 03/29/20   Alla Feeling, NP  lisinopril (ZESTRIL) 40 MG tablet Take 1 tablet (40 mg total) by mouth daily. 05/11/20   Lorretta Harp, MD  magnesium oxide (MAG-OX) 400 MG tablet TAKE 1 TABLET BY MOUTH EVERY DAY Patient taking differently: Take 400 mg by mouth daily.  05/10/20   Alla Feeling, NP  metFORMIN (GLUCOPHAGE) 500 MG tablet Take 500 mg by mouth 2 (two) times daily with a meal.     [provider]  metoprolol succinate (TOPROL-XL) 25 MG 24 hr tablet Take 1 tablet (25 mg total) by mouth daily. 04/15/20   Lorretta Harp, MD  ondansetron (ZOFRAN) 8 MG  tablet Take 1 tablet (8 mg total) by mouth 2 (two) times daily as needed. Start on the third day after chemotherapy. 05/03/20   [name], MD  pantoprazole (PROTONIX) 40 MG tablet Take 1 tablet (40 mg total) by mouth 2 (two) times daily. 05/03/20   [name], MD  pravastatin (PRAVACHOL) 40 MG tablet Take 40 mg by mouth daily.    [provider]  sucralfate (CARAFATE) 1 g tablet TAKE 1 TABLET BY MOUTH EVERY 6 HOURS AS NEEDED. 05/06/20   [name], [name], MD  tamsulosin (FLOMAX) 0.4 MG CAPS capsule Take 0.4 mg by mouth daily.    [provider]  Vitamin D, Ergocalciferol, (DRISDOL) 1.25 MG (50000 UT) CAPS capsule Take 50,000 Units by mouth every 7 (seven) days.    [provider]    Allergies    Patient has no known allergies.  Review of Systems   Review of Systems  Respiratory: Positive for shortness of breath.   Cardiovascular: Positive for leg swelling.  All other systems reviewed and are negative.   Physical Exam Updated Vital Signs BP (!) 158/94 (BP Location: Left Arm)   Pulse 89   Temp 98.2 F (36.8 C) (Oral)   Resp 17   Ht 5\' 9"  (1.753 m)   Wt 100.7 kg   SpO2 92%   BMI 32.78 kg/m   Physical Exam Vitals and nursing note reviewed.  Constitutional:      General: He is not in acute distress.    Appearance: Normal appearance. He is well-developed.  HENT:     Head: Normocephalic and atraumatic.     Right Ear: Hearing normal.     Left Ear: Hearing normal.     Nose: Nose normal.  Eyes:     Conjunctiva/sclera: Conjunctivae normal.     Pupils: Pupils are equal, round, and reactive to light.  Cardiovascular:     Rate and Rhythm: Regular rhythm.     Heart sounds: S1 normal and S2 normal. No murmur heard.  No friction rub. No gallop.   Pulmonary:     Effort: Pulmonary effort is normal. No respiratory distress.     Breath sounds: Decreased breath sounds and rales present.  Chest:     Chest wall: No tenderness.  Abdominal:     General: Bowel  sounds are normal.     Palpations: Abdomen is soft.     Tenderness: There is no abdominal tenderness. There is no guarding or rebound. Negative signs include Murphy's sign and McBurney's sign.     Hernia: No hernia is present.  Musculoskeletal:        General: Normal range of motion.     Cervical back: Normal range of motion and neck supple.  Right lower leg: Edema present.     Left lower leg: Edema present.  Skin:    General: Skin is warm and dry.     Findings: No rash.  Neurological:     Mental Status: He is alert and oriented to person, place, and time.     GCS: GCS eye subscore is 4. GCS verbal subscore is 5. GCS motor subscore is 6.     Cranial Nerves: No cranial nerve deficit.     Sensory: No sensory deficit.     Coordination: Coordination normal.  Psychiatric:        Speech: Speech normal.        Behavior: Behavior normal.        Thought Content: Thought content normal.     ED Results / Procedures / Treatments   Labs (all labs ordered are listed, but only abnormal results are displayed) Labs Reviewed  CBC WITH DIFFERENTIAL/PLATELET - Abnormal; Notable for the following components:      Result Value   RBC 2.61 (*)    Hemoglobin 9.5 (*)    HCT 29.4 (*)    MCV 112.6 (*)    MCH 36.4 (*)    RDW 20.0 (*)    Platelets 86 (*)    nRBC 0.4 (*)    All other components within normal limits  COMPREHENSIVE METABOLIC PANEL - Abnormal; Notable for the following components:   CO2 21 (*)    Glucose, Bld 156 (*)    Creatinine, Ser 1.63 (*)    Total Protein 6.2 (*)    GFR calc non Af Amer 41 (*)    GFR calc Af Amer 48 (*)    All other components within normal limits  BRAIN NATRIURETIC PEPTIDE - Abnormal; Notable for the following components:   B Natriuretic Peptide 428.4 (*)    All other components within normal limits  TROPONIN I (HIGH SENSITIVITY) - Abnormal; Notable for the following components:   Troponin I (High Sensitivity) 42 (*)    All other components within normal  limits  TROPONIN I (HIGH SENSITIVITY) - Abnormal; Notable for the following components:   Troponin I (High Sensitivity) 40 (*)    All other components within normal limits  SARS CORONAVIRUS 2 BY RT PCR (HOSPITAL ORDER, [facility])  MAGNESIUM    EKG None  Radiology DG Chest Port 1 View  Result Date: 05/20/2020 CLINICAL DATA:  Initial evaluation for acute shortness of breath. EXAM: PORTABLE CHEST 1 VIEW COMPARISON:  Prior radiograph from 03/23/2020. FINDINGS: Right-sided Port-A-Cath in place with tip overlying the cavoatrial junction. Mild cardiomegaly, stable. Mediastinal silhouette within normal limits. Lungs mildly hypoinflated. Diffuse vascular and interstitial prominence compatible with pulmonary interstitial edema. Possible trace layering fluid noted along the right minor fissure. Blunting of the right costophrenic angle also suggestive of a small pleural effusion. No consolidative airspace disease. No pneumothorax. No acute osseous finding. IMPRESSION: Cardiomegaly with mild diffuse pulmonary interstitial edema and small right pleural effusion, suggesting CHF. Electronically Signed   By: Jeannine Boga M.D.   On: 05/20/2020 02:15    Procedures Procedures (including critical care time)  Medications Ordered in ED Medications  furosemide (LASIX) injection 80 mg (80 mg Intravenous Given 05/20/20 0239)    ED Course  I have reviewed the triage vital signs and the nursing notes.  Pertinent labs & imaging results that were available during my care of the patient were reviewed by me and considered in my medical decision making (see chart  for details).    MDM Rules/Calculators/A&P                          Patient presents to the emergency department for evaluation of shortness of breath. Patient comes to the ER from home by ambulance. EMS report hypoxia to 82% on room air. He has improved on supplemental oxygen. Examination is concerning for volume overload.  He has significant lower extremity edema, crackles throughout the lung fields. Patient was cardioverted for atrial fibrillation as an outpatient yesterday. He is in sinus rhythm currently. He is not experiencing chest pain. Does have a very slightly elevated troponin, not concerning for acute coronary syndrome at this time.  Cardiology has evaluated the patient.  He seems to be improving after initiation of diuresis.  He has gone from requiring 6 L by nasal cannula to now being on room air.  Cardiology recommends hospitalization, repeat echo and cardiology will follow along.  Final Clinical Impression(s) / ED Diagnoses Final diagnoses:  Acute congestive heart failure, unspecified heart failure type [facility])    Rx / DC Orders ED Discharge Orders    None       Adaeze Better, Gwenyth Allegra, MD 05/20/20 [phone]

## 2020-05-20 NOTE — H&P (Addendum)
History and Physical    Brandon Sherman QMG:[id number] DOB: 08-28-1946 DOA: 05/20/2020  PCP: [name], PA  Patient coming from: Home.  Chief Complaint: Shortness of breath.  HPI: Brandon Sherman is a 74 y.o. male with history of recently diagnosed A. fib but on 3 months ago when patient was having a Port-A-Cath placement for cholangiocarcinoma had last chemotherapy 2 weeks ago had cardioversion yesterday following which patient went home started getting some cough and went was about lying on the bed became acutely short of breath and was brought to the ER.  Denies any chest pain.  Denies fever or chills.  Has been having bilateral lower extremity edema and takes Lasix every other day.   ED Course: In the ER patient was initially on 4 L oxygen chest x-ray showing pulmonary edema patient was given Lasix 80 mg IV following which patient diuresed and symptoms improved.  Cardiology was consulted.  EKG shows sinus rhythm.  Labs are remarkable for BNP of 428 creatinine of 1.6 with hemoglobin of 9.5 platelets of 86.  High-sensitivity troponin was 42 and 40.  Patient admitted for acute CHF.  Covid test was negative.  Review of Systems: As per HPI, rest all negative.   Past Medical History:  Diagnosis Date  . Arthritis   . Diabetes ([place])   . GERD (gastroesophageal reflux disease)   . Hyperlipidemia   . Hypertension   . 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 APPENDECTOMY  1980  . CARDIOVERSION N/A 04/27/2020   Procedure: CARDIOVERSION;  Surgeon: Dorothy Spark, MD;  Location: [facility] ENDOSCOPY;  Service: Cardiovascular;  Laterality: N/A;  . COLONOSCOPY    . IR IMAGING GUIDED PORT INSERTION  02/23/2020     reports that he has never smoked. His smokeless tobacco use includes chew. He reports previous alcohol use. He reports that he does not use drugs.  No Known Allergies  Family History  Problem Relation Age of Onset  . Heart  attack Mother   . Stroke Father   . Colon cancer Neg Hx   . Esophageal cancer Neg Hx   . Stomach cancer Neg Hx   . Rectal cancer Neg Hx   . Colon polyps Neg Hx     Prior to Admission medications   Medication Sig Start Date End Date Taking? Authorizing Provider  allopurinol (ZYLOPRIM) 100 MG tablet Take 100 mg by mouth daily.    [provider]  apixaban (ELIQUIS) 5 MG TABS tablet Take 1 tablet (5 mg total) by mouth 2 (two) times daily. 03/24/20   Lorretta Harp, MD  diltiazem (CARDIZEM CD) 180 MG 24 hr capsule Take 1 capsule (180 mg total) by mouth daily. 05/11/20 08/09/20  Lorretta Harp, MD  dronedarone (MULTAQ) 400 MG tablet Take 1 tablet (400 mg total) by mouth 2 (two) times daily with a meal. 05/16/20   Fenton, Clint R, PA  fenofibrate (TRICOR) 145 MG tablet Take 145 mg by mouth daily.    [provider]  finasteride (PROSCAR) 5 MG tablet Take 5 mg by mouth daily.    [provider]  furosemide (LASIX) 20 MG tablet Take 1 tablet (20 mg total) by mouth every other day. 03/24/20   Lorretta Harp, MD  lidocaine-prilocaine (EMLA) cream Apply 1 application topically as needed. 03/29/20   Alla Feeling, NP  lisinopril (ZESTRIL) 40 MG tablet Take 1 tablet (40 mg total) by mouth daily.  05/11/20   Lorretta Harp, MD  magnesium oxide (MAG-OX) 400 MG tablet TAKE 1 TABLET BY MOUTH EVERY DAY Patient taking differently: Take 400 mg by mouth daily.  05/10/20   Alla Feeling, NP  metFORMIN (GLUCOPHAGE) 500 MG tablet Take 500 mg by mouth 2 (two) times daily with a meal.     [provider]  metoprolol succinate (TOPROL-XL) 25 MG 24 hr tablet Take 1 tablet (25 mg total) by mouth daily. 04/15/20   Lorretta Harp, MD  ondansetron (ZOFRAN) 8 MG tablet Take 1 tablet (8 mg total) by mouth 2 (two) times daily as needed. Start on the third day after chemotherapy. 05/03/20   [name], MD  pantoprazole (PROTONIX) 40 MG tablet Take 1 tablet (40 mg total) by mouth 2 (two)  times daily. 05/03/20   [name], MD  pravastatin (PRAVACHOL) 40 MG tablet Take 40 mg by mouth daily.    [provider]  sucralfate (CARAFATE) 1 g tablet TAKE 1 TABLET BY MOUTH EVERY 6 HOURS AS NEEDED. 05/06/20   [name], [name], MD  tamsulosin (FLOMAX) 0.4 MG CAPS capsule Take 0.4 mg by mouth daily.    [provider]  Vitamin D, Ergocalciferol, (DRISDOL) 1.25 MG (50000 UT) CAPS capsule Take 50,000 Units by mouth every 7 (seven) days.    [provider]    Physical Exam: Constitutional: Moderately built and nourished. Vitals:   05/20/20 0445 05/20/20 0449 05/20/20 0545 05/20/20 0556  BP:  (!) 158/94  139/77  Pulse: 89 89  78  Resp: (!) 28 17  (!) 21  Temp:      TempSrc:      SpO2: 90% 92% 93% 96%  Weight:      Height:       Eyes: Anicteric no pallor. ENMT: No discharge from the ears eyes nose or mouth. Neck: No mass felt.  No neck rigidity.  JVD elevated. Respiratory: No rhonchi or crepitations. Cardiovascular: S1-S2 heard. Abdomen: Soft nontender bowel sounds present. Musculoskeletal: Bilateral lower extremity edema present. Skin: No rash. Neurologic: Alert awake oriented to time place and person.  Moves all extremities. Psychiatric: Appears normal.  Normal affect.   Labs on Admission: I have personally reviewed following labs and imaging studies  CBC: Recent Labs  Lab 05/20/20 0133  WBC 6.8  NEUTROABS 5.1  HGB 9.5*  HCT 29.4*  MCV 112.6*  PLT 86*   Basic Metabolic Panel: Recent Labs  Lab 05/20/20 0133 05/20/20 0227  NA 139  --   K 3.9  --   CL 109  --   CO2 21*  --   GLUCOSE 156*  --   BUN 19  --   CREATININE 1.63*  --   CALCIUM 9.1  --   MG  --  1.7   GFR: Estimated Creatinine Clearance: 47.9 mL/min (A) (by C-G formula based on SCr of 1.63 mg/dL (H)). Liver Function Tests: Recent Labs  Lab 05/20/20 0133  AST 23  ALT 17  ALKPHOS 42  BILITOT 0.8  PROT 6.2*  ALBUMIN 3.6   No results for input(s): LIPASE, AMYLASE  in the last 168 hours. No results for input(s): AMMONIA in the last 168 hours. Coagulation Profile: No results for input(s): INR, PROTIME in the last 168 hours. Cardiac Enzymes: No results for input(s): CKTOTAL, CKMB, CKMBINDEX, TROPONINI in the last 168 hours. BNP (last 3 results) No results for input(s): PROBNP in the last 8760 hours. HbA1C: No results for input(s): HGBA1C in the  last 72 hours. CBG: Recent Labs  Lab 05/19/20 1104  GLUCAP 85   Lipid Profile: No results for input(s): CHOL, HDL, LDLCALC, TRIG, CHOLHDL, LDLDIRECT in the last 72 hours. Thyroid Function Tests: No results for input(s): TSH, T4TOTAL, FREET4, T3FREE, THYROIDAB in the last 72 hours. Anemia Panel: No results for input(s): VITAMINB12, FOLATE, FERRITIN, TIBC, IRON, RETICCTPCT in the last 72 hours. Urine analysis:    Component Value Date/Time   COLORURINE YELLOW 03/15/2020 1942   APPEARANCEUR CLEAR 03/15/2020 1942   LABSPEC 1.010 03/15/2020 1942   PHURINE 7.0 03/15/2020 1942   GLUCOSEU NEGATIVE 03/15/2020 1942   HGBUR SMALL (A) 03/15/2020 1942   BILIRUBINUR NEGATIVE 03/15/2020 1942   KETONESUR NEGATIVE 03/15/2020 1942   PROTEINUR NEGATIVE 03/15/2020 1942   NITRITE NEGATIVE 03/15/2020 1942   LEUKOCYTESUR NEGATIVE 03/15/2020 1942   Sepsis Labs: @LABRCNTIP (procalcitonin:4,lacticidven:4) ) Recent Results (from the past 240 hour(s))  SARS CORONAVIRUS 2 (TAT 6-24 HRS) Nasopharyngeal Nasopharyngeal Swab     Status: None   Collection Time: 05/15/20 11:00 AM   Specimen: Nasopharyngeal Swab  Result Value Ref Range Status   SARS Coronavirus 2 NEGATIVE NEGATIVE Final    Comment: (NOTE) SARS-CoV-2 target nucleic acids are NOT DETECTED.  The SARS-CoV-2 RNA is generally detectable in upper and lower respiratory specimens during the acute phase of infection. Negative results do not preclude SARS-CoV-2 infection, do not rule out co-infections with other pathogens, and should not be used as the sole basis for  treatment or other patient management decisions. Negative results must be combined with clinical observations, patient history, and epidemiological information. The expected result is Negative.  Fact Sheet for Patients: SugarRoll.be  Fact Sheet for Healthcare Providers: https://www.woods-mathews.com/  This test is not yet approved or cleared by the [place] FDA and  has been authorized for detection and/or diagnosis of SARS-CoV-2 by FDA under an Emergency Use Authorization (EUA). This EUA will remain  in effect (meaning this test can be used) for the duration of the COVID-19 declaration under Se ction 564(b)(1) of the Act, 21 U.S.C. section 360bbb-3(b)(1), unless the authorization is terminated or revoked sooner.  Performed at [facility], [place] [address], [place], [place] [address]   SARS Coronavirus 2 by RT PCR (hospital order, performed in [facility] hospital lab) Nasopharyngeal Nasopharyngeal Swab     Status: None   Collection Time: 05/20/20  1:56 AM   Specimen: Nasopharyngeal Swab  Result Value Ref Range Status   SARS Coronavirus 2 NEGATIVE NEGATIVE Final    Comment: (NOTE) SARS-CoV-2 target nucleic acids are NOT DETECTED.  The SARS-CoV-2 RNA is generally detectable in upper and lower respiratory specimens during the acute phase of infection. The lowest concentration of SARS-CoV-2 viral copies this assay can detect is 250 copies / mL. A negative result does not preclude SARS-CoV-2 infection and should not be used as the sole basis for treatment or other patient management decisions.  A negative result may occur with improper specimen collection / handling, submission of specimen other than nasopharyngeal swab, presence of viral mutation(s) within the areas targeted by this assay, and inadequate number of viral copies (<250 copies / mL). A negative result must be combined with clinical observations, patient history, and  epidemiological information.  Fact Sheet for Patients:   StrictlyIdeas.no  Fact Sheet for Healthcare Providers: BankingDealers.co.za  This test is not yet approved or  cleared by the [place] FDA and has been authorized for detection and/or diagnosis of SARS-CoV-2 by FDA under an Emergency Use  Authorization (EUA).  This EUA will remain in effect (meaning this test can be used) for the duration of the COVID-19 declaration under Section 564(b)(1) of the Act, 21 U.S.C. section 360bbb-3(b)(1), unless the authorization is terminated or revoked sooner.  Performed at [facility], [place] [address], [place], [place] [address]      Radiological Exams on Admission: DG Chest Port 1 View  Result Date: 05/20/2020 CLINICAL DATA:  Initial evaluation for acute shortness of breath. EXAM: PORTABLE CHEST 1 VIEW COMPARISON:  Prior radiograph from 03/23/2020. FINDINGS: Right-sided Port-A-Cath in place with tip overlying the cavoatrial junction. Mild cardiomegaly, stable. Mediastinal silhouette within normal limits. Lungs mildly hypoinflated. Diffuse vascular and interstitial prominence compatible with pulmonary interstitial edema. Possible trace layering fluid noted along the right minor fissure. Blunting of the right costophrenic angle also suggestive of a small pleural effusion. No consolidative airspace disease. No pneumothorax. No acute osseous finding. IMPRESSION: Cardiomegaly with mild diffuse pulmonary interstitial edema and small right pleural effusion, suggesting CHF. Electronically Signed   By: Jeannine Boga M.D.   On: 05/20/2020 02:15    EKG: Independently reviewed.  Normal sinus rhythm.  Assessment/Plan Principal Problem:   Acute diastolic CHF (congestive heart failure) (HCC) Active Problems:   GOUT   Essential hypertension   Intrahepatic cholangiocarcinoma (HCC)   Atrial fibrillation (HCC)   Coronary artery calcification seen  on CT scan   Acute CHF (congestive heart failure) ([place])    1. Acute diastolic CHF last EF measured was in May 2021 showed an EF of 60 to 65%.  Patient symptoms improved with Lasix 80 mg IV I have placed patient on 40 mg IV every 12.  Closely follow intake output and daily weights and metabolic panel.  Cardiology has been consulted recommendations awaited.  Check TSH and D-dimer. 2. Acute renal failure cause not clear.  Will hold of lisinopril for now check UA.  Follow intake output metabolic panel.  Patient is on IV Lasix. 3. History of A. fib with recent cardioversion yesterday.  Presently in sinus rhythm.  On Eliquis for anticoagulation patient also is on Multaq beta-blocker and Cardizem. 4. Hypertension on Cardizem beta-blockers holding lisinopril due to acute renal failure follow blood pressure trends.  Patient on IV Lasix. 5. Anemia and thrombocytopenia could be from chemotherapy.  Patient is afebrile.  However since patient has acute renal failure will check LDH and smear review to rule out any hemolytic process. 6. Cholangiocarcinoma being followed by Dr. Burr Medico oncologist received chemotherapy 2 weeks ago. 7. Diabetes mellitus type 2 we will keep patient on sliding scale coverage. 8. History of gout on allopurinol.  Note that patient has acute renal failure will closely monitor metabolic panel and some other medications including Eliquis and allopurinol doses may have to be changed.  Since patient has acute CHF with renal failure will need close monitoring and inpatient status.   DVT prophylaxis: Eliquis. Code Status: Full code. Family Communication: Discussed with patient. Disposition Plan: Home. Consults called: Cardiology. Admission status: Inpatient.   Rise Patience MD [facility] Pager [phone].  If 7PM-7AM, please contact night-coverage [email or web address] Password [email or web address]  05/20/2020, 6:30 AM

## 2020-05-20 NOTE — ED Triage Notes (Signed)
Pt arrived from home via [facility] related to SOB. EMS reports Pt was 82% on RA and was put on 4L and increased to 6L to get Pt to 94%.EMS reported diminished breath sounds.  EMS states that Pt showed to be  In 1st degree heart block with frequent PAC's.  Pt was cardioverted eariler today at [facility].

## 2020-05-20 NOTE — ED Notes (Signed)
Attempted report x1. 

## 2020-05-21 ENCOUNTER — Telehealth: Payer: Self-pay | Admitting: Cardiovascular Disease

## 2020-05-21 DIAGNOSIS — I5031 Acute diastolic (congestive) heart failure: Secondary | ICD-10-CM

## 2020-05-21 DIAGNOSIS — M1000 Idiopathic gout, unspecified site: Secondary | ICD-10-CM

## 2020-05-21 DIAGNOSIS — I10 Essential (primary) hypertension: Secondary | ICD-10-CM

## 2020-05-21 LAB — BASIC METABOLIC PANEL
Anion gap: 9 (ref 5–15)
BUN: 18 mg/dL (ref 8–23)
CO2: 23 mmol/L (ref 22–32)
Calcium: 8.8 mg/dL — ABNORMAL LOW (ref 8.9–10.3)
Chloride: 107 mmol/L (ref 98–111)
Creatinine, Ser: 1.5 mg/dL — ABNORMAL HIGH (ref 0.61–1.24)
GFR calc Af Amer: 53 mL/min — ABNORMAL LOW (ref >60)
GFR calc non Af Amer: 46 mL/min — ABNORMAL LOW (ref >60)
Glucose, Bld: 110 mg/dL — ABNORMAL HIGH (ref 70–99)
Potassium: 4 mmol/L (ref 3.5–5.1)
Sodium: 139 mmol/L (ref 135–145)

## 2020-05-21 LAB — GLUCOSE, CAPILLARY
Glucose-Capillary: 102 mg/dL — ABNORMAL HIGH (ref 70–99)
Glucose-Capillary: 108 mg/dL — ABNORMAL HIGH (ref 70–99)
Glucose-Capillary: 148 mg/dL — ABNORMAL HIGH (ref 70–99)
Glucose-Capillary: 151 mg/dL — ABNORMAL HIGH (ref 70–99)

## 2020-05-21 LAB — CBC
HCT: 24.4 % — ABNORMAL LOW (ref 39.0–52.0)
Hemoglobin: 7.9 g/dL — ABNORMAL LOW (ref 13.0–17.0)
MCH: 35.9 pg — ABNORMAL HIGH (ref 26.0–34.0)
MCHC: 32.4 g/dL (ref 30.0–36.0)
MCV: 110.9 fL — ABNORMAL HIGH (ref 80.0–100.0)
Platelets: 93 10*3/uL — ABNORMAL LOW (ref 150–400)
RBC: 2.2 MIL/uL — ABNORMAL LOW (ref 4.22–5.81)
RDW: 19.8 % — ABNORMAL HIGH (ref 11.5–15.5)
WBC: 4.9 10*3/uL (ref 4.0–10.5)
nRBC: 0 % (ref 0.0–0.2)

## 2020-05-21 MED ORDER — SODIUM CHLORIDE 0.9% FLUSH
10.0000 mL | Freq: Two times a day (BID) | INTRAVENOUS | Status: DC
  Administered 2020-05-21 – 2020-05-22 (×2): 10 mL

## 2020-05-21 MED ORDER — SODIUM CHLORIDE 0.9% FLUSH
10.0000 mL | INTRAVENOUS | Status: DC | PRN
  Administered 2020-05-22: 10 mL

## 2020-05-21 NOTE — Discharge Instructions (Signed)

## 2020-05-21 NOTE — Progress Notes (Signed)
Progress Note  Patient Name: Brandon Sherman Date of Encounter: 05/21/2020  [name] Cardiologist: Quay Burow, MD   Subjective   Remains dyspneic but improving; no CP  Inpatient Medications    Scheduled Meds: . allopurinol  100 mg Oral Daily  . apixaban  5 mg Oral BID  . Chlorhexidine Gluconate Cloth  6 each Topical Daily  . colchicine  0.6 mg Oral Daily  . diltiazem  180 mg Oral Daily  . dronedarone  400 mg Oral BID WC  . fenofibrate  160 mg Oral Daily  . finasteride  5 mg Oral Daily  . furosemide  40 mg Intravenous Q12H  . insulin aspart  0-9 Units Subcutaneous TID WC  . magnesium oxide  400 mg Oral Daily  . metoprolol succinate  25 mg Oral Daily  . pantoprazole  40 mg Oral BID  . pravastatin  40 mg Oral Daily  . predniSONE  40 mg Oral Q breakfast  . sodium chloride flush  10-40 mL Intracatheter Q12H  . tamsulosin  0.4 mg Oral Daily   Continuous Infusions:  PRN Meds: ondansetron **OR** ondansetron (ZOFRAN) IV, sodium chloride flush   Vital Signs    Vitals:   05/21/20 0011 05/21/20 0353 05/21/20 0623 05/21/20 0729  BP: 131/77 (!) 142/83 (!) 142/76 (!) 144/76  Pulse: 79 66  78  Resp: 18 18  20   Temp: 98.8 F (37.1 C) 98.4 F (36.9 C)  98.2 F (36.8 C)  TempSrc: Oral Oral  Oral  SpO2: 96% 95%  97%  Weight:  97 kg    Height:        Intake/Output Summary (Last 24 hours) at 05/21/2020 0907 Last data filed at 05/21/2020 0757 Gross per 24 hour  Intake 240 ml  Output 3825 ml  Net -3585 ml   Last 3 Weights 05/21/2020 05/20/2020 05/19/2020  Weight (lbs) 213 lb 12.8 oz 222 lb 221 lb 9.6 oz  Weight (kg) 96.979 kg 100.699 kg 100.517 kg      Telemetry    Sinus- Personally Reviewed   Physical Exam   GEN: No acute distress.  Obese Neck: Positive JVD Cardiac: RRR Respiratory: Clear to auscultation bilaterally. GI: Soft, nontender, non-distended  MS: 2 + edema Neuro:  Nonfocal  Psych: Normal affect   Labs    High Sensitivity Troponin:   Recent  Labs  Lab 05/20/20 0133 05/20/20 0227  TROPONINIHS 42* 40*      Chemistry Recent Labs  Lab 05/20/20 0133 05/21/20 0502  NA 139 139  K 3.9 4.0  CL 109 107  CO2 21* 23  GLUCOSE 156* 110*  BUN 19 18  CREATININE 1.63* 1.50*  CALCIUM 9.1 8.8*  PROT 6.2*  --   ALBUMIN 3.6  --   AST 23  --   ALT 17  --   ALKPHOS 42  --   BILITOT 0.8  --   GFRNONAA 41* 46*  GFRAA 48* 53*  ANIONGAP 9 9     Hematology Recent Labs  Lab 05/20/20 0133 05/21/20 0502  WBC 6.8 4.9  RBC 2.61* 2.20*  HGB 9.5* 7.9*  HCT 29.4* 24.4*  MCV 112.6* 110.9*  MCH 36.4* 35.9*  MCHC 32.3 32.4  RDW 20.0* 19.8*  PLT 86* 93*    BNP Recent Labs  Lab 05/20/20 0133  BNP 428.4*     DDimer  Recent Labs  Lab 05/20/20 0804  DDIMER 1.60*     Radiology    DG Chest [facility] 1 [address]  Result Date: 05/20/2020 CLINICAL DATA:  Initial evaluation for acute shortness of breath. EXAM: PORTABLE CHEST 1 VIEW COMPARISON:  Prior radiograph from 03/23/2020. FINDINGS: Right-sided Port-A-Cath in place with tip overlying the cavoatrial junction. Mild cardiomegaly, stable. Mediastinal silhouette within normal limits. Lungs mildly hypoinflated. Diffuse vascular and interstitial prominence compatible with pulmonary interstitial edema. Possible trace layering fluid noted along the right minor fissure. Blunting of the right costophrenic angle also suggestive of a small pleural effusion. No consolidative airspace disease. No pneumothorax. No acute osseous finding. IMPRESSION: Cardiomegaly with mild diffuse pulmonary interstitial edema and small right pleural effusion, suggesting CHF. Electronically Signed   By: Jeannine Boga M.D.   On: 05/20/2020 02:15   ECHOCARDIOGRAM LIMITED  Result Date: 05/20/2020    ECHOCARDIOGRAM LIMITED REPORT   Patient Name:   Brandon Sherman Date of Exam: 05/20/2020 Medical Rec #:  [id number]         Height:       69.0 in Accession #:    [id number]        Weight:       222.0 lb Date of Birth:  [birth year]           BSA:          2.160 m Patient Age:    74 years          BP:           145/83 mmHg Patient Gender: M                 HR:           73 bpm. Exam Location:  Inpatient Procedure: Limited Echo, Cardiac Doppler, Color Doppler and Intracardiac            Opacification Agent Indications:    CHF-Acute diastolic  History:        Patient has prior history of Echocardiogram examinations, most                 recent 04/13/2020. CHF, Arrythmias:Atrial Fibrillation; Risk                 Factors:Hypertension and Dyslipidemia.  Sonographer:    Clayton Lefort RDCS (AE) Referring Phys: 1993 [facility] [place] BARRETT  Sonographer Comments: Suboptimal apical window and patient is morbidly obese. IMPRESSIONS  1. Left ventricular ejection fraction, by estimation, is 55 to 60%. The left ventricle has normal function. The left ventricle has no regional wall motion abnormalities. There is moderate left ventricular hypertrophy. Left ventricular diastolic parameters are indeterminate.  2. Right ventricular systolic function is normal. The right ventricular size is normal. There is moderately elevated pulmonary artery systolic pressure. The estimated right ventricular systolic pressure is 84.6 mmHg.  3. The mitral valve is grossly normal. Mild mitral valve regurgitation.  4. The aortic valve is tricuspid. Aortic valve regurgitation is mild. Mild to moderate aortic valve sclerosis/calcification is present, without any evidence of aortic stenosis.  5. Aortic dilatation noted. 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  6. The inferior vena cava is normal in size with <50% respiratory variability, suggesting right atrial pressure of 8 mmHg. Comparison(s): A prior study was performed on 04/13/20. No significant change from prior study. Prior images reviewed side by side. FINDINGS  Left Ventricle: Left ventricular ejection fraction, by estimation, is 55 to 60%. The left ventricle has normal function. The left ventricle has no regional  wall motion abnormalities. Definity contrast agent was given IV to delineate the left ventricular  endocardial borders. There is moderate left ventricular hypertrophy. Right Ventricle: The right ventricular size is normal. Right ventricular systolic function is normal. There is moderately elevated pulmonary artery systolic pressure. The tricuspid regurgitant velocity is 3.16 m/s, and with an assumed right atrial pressure of 10 mmHg, the estimated right ventricular systolic pressure is 08.6 mmHg. Left Atrium: Left atrial size was not well visualized. Right Atrium: Right atrial size was not well visualized. Pericardium: There is no evidence of pericardial effusion. Mitral Valve: The mitral valve is grossly normal. Mild mitral valve regurgitation. Tricuspid Valve: The tricuspid valve is not well visualized. Tricuspid valve regurgitation is mild. Aortic Valve: The aortic valve is tricuspid. Aortic valve regurgitation is mild. Mild to moderate aortic valve sclerosis/calcification is present, without any evidence of aortic stenosis. There is moderate calcification of the aortic valve. Pulmonic Valve: The pulmonic valve was not well visualized. Pulmonic valve regurgitation is trivial. Aorta: Aortic dilatation noted. 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 Venous: The inferior vena cava is normal in size with less than 50% respiratory variability, suggesting right atrial pressure of 8 mmHg. IAS/Shunts: The interatrial septum was not assessed.  LEFT VENTRICLE PLAX 2D LVIDd:         4.40 cm LVIDs:         2.70 cm LV PW:         1.50 cm LV IVS:        1.50 cm LVOT diam:     2.00 cm LVOT Area:     3.14 cm  IVC IVC diam: 2.00 cm LEFT ATRIUM         Index LA diam:    4.90 cm 2.27 cm/m   AORTA Ao Root diam: 4.10 cm Ao Asc diam:  3.80 cm TRICUSPID VALVE TR Peak grad:   39.9 mmHg TR Vmax:        316.00 cm/s  SHUNTS Systemic Diam: 2.00 cm Cherlynn Kaiser MD Electronically signed by Cherlynn Kaiser MD Signature  Date/Time: 05/20/2020/5:34:43 PM    Final     Patient Profile     74 y.o. 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  Echocardiogram shows normal LV function, moderate left ventricular hypertrophy, moderate pulmonary hypertension, mild mitral regurgitation, mild aortic insufficiency and mildly dilated aortic root.  Assessment & Plan    1 acute diastolic congestive heart failure-I/O-3825; Wt 97 kg.  Volume status is improving.  Continue Lasix 40 mg IV twice daily.  Follow renal function closely.  Echocardiogram shows preserved LV function.  2 paroxysmal atrial fibrillation-patient is back in atrial fibrillation today.  Best option would likely be rate control and anticoagulation particularly in light of his other health issues.  Continue metoprolol and Cardizem.  Continue apixaban.  3 acute stage III kidney disease-renal function is worse compared to previous.  Follow closely with diuresis.  4 hypertension-blood pressure borderline.  Continue present medications and follow.  5 intrahepatic cholangiocarcinoma  For questions or updates, please contact [place] Please consult [email or web address] for contact info under        Signed, Kirk Ruths, MD  05/21/2020, 9:07 AM

## 2020-05-21 NOTE — Telephone Encounter (Signed)
Patient is hospitalized, she states he is supposed to have some blood work drawn for Dr. Gwenlyn Found. She wants to know if the blood work in the hospital would be fine to use rather than having him come to the office and get it drawn once he is out of the hospital.

## 2020-05-21 NOTE — Progress Notes (Signed)
[facility]                                                                              Patient Demographics  Brandon Sherman, is a 74 y.o. male, DOB - 10-20-1946, TSV:[id number]  Admit date - 05/20/2020   Admitting Physician Rise Patience, MD  Outpatient Primary MD for the patient is [name], PA  Outpatient specialists:   LOS - 1  days   Medical records reviewed and are as summarized below:    Chief Complaint  Patient presents with   Shortness of Breath       Brief summary   Brandon Sherman is a 74 y.o. male with history of recently diagnosed A. fib but on 3 months ago when patient was having a Port-A-Cath placement for cholangiocarcinoma had last chemotherapy 2 weeks ago had cardioversion a day prior to admission on 6/30 following which patient went home started getting some cough and went was about lying on the bed became acutely short of breath and was brought to the ER.  Denied any chest pain.  Denied fever or chills.  Patient was having bilateral lower extremity edema and was taking Lasix every other day.  In ED, patient was placed on 4 L O2 (per patient, not on home O2), chest x-ray showed pulmonary edema, given Lasix 80 mg IV x1.  Creatinine 1.6, hemoglobin 9.5, BNP 428.  Patient was admitted for acute CHF   Assessment & Plan    Principal Problem:   Acute diastolic CHF (congestive heart failure) ([place]), acute respiratory failure with hypoxia -Presenting with elevated BNP, pulmonary edema on chest x-ray, acute shortness of breath with lower extremity edema - 2D echo repeated 7/1 showed EF of 55 to 92%, diastolic parameters indeterminate -Patient received Lasix 80 mg IV x1, continue Lasix 40 mg every 12 hours IV, negative balance of 3.8 L -Continue strict I's and O's and daily weights, hold ACE inhibitor -D-dimer 1.6, creatinine trended up to 1.6, patient has been on Eliquis, less likelihood of PE however possibility due to history of  malignancy, hypercoagulable state -Appreciate cardiology recommendations -Patient on no O2 at home, currently on 2 L O2 via , wean O2 as tolerated.  -Home O2 evaluation prior to discharge -States shortness of breath is improving, still has lower extremity edema. -Follow creatinine function with diuresis  Active Problems: Left foot GOUT flare -Patient reports has history of gout, has flare in left foot/ankle -Unable to place on NSAIDs due to renal insufficiency -Continue colchicine, prednisone allopurinol, improving    Essential hypertension -New beta-blocker, Cardizem, holding lisinopril due to renal function     Intrahepatic cholangiocarcinoma (HCC) -Followed by Dr. Burr Medico, received chemotherapy 2 weeks ago    Atrial fibrillation ([place]), paroxysmal -Underwent recent cardioversion on 05/19/2020 -Cardiology following, presently in sinus rhythm,  - on Eliquis for anticoagulation, on Cardizem, beta-blocker, Multaq  Diabetes mellitus type 2 -Continue sliding scale insulin while inpatient  Chronic anemia, thrombocytopenia -Likely secondary to bone marrow depression, chemotherapy, baseline H&H 9-10 -Hemoglobin 7.9, transfuse for hemoglobin less than 7.5  Acute kidney injury -Creatinine 1.6, likely due to lisinopril, diuresis, acute diastolic  CHF with decreased renal perfusion -Creatinine function improving, 1.5  Obesity Estimated body mass index is 31.57 kg/m as calculated from the following:   Height as of this encounter: 5\' 9"  (1.753 m).   Weight as of this encounter: 97 kg.  Code Status: Full CODE STATUS DVT Prophylaxis: Eliquis Family Communication: Discussed all imaging results, lab results, explained to the patient.  Unable to make contact with patient's wife, goes into the voicemail.  Disposition Plan:     Status is: Inpatient  Remains inpatient appropriate because:Inpatient level of care appropriate due to severity of illness   Dispo: The patient is from: Home               Anticipated d/c is to: Home              Anticipated d/c date is: 2 days              Patient currently is not medically stable to d/c.    Time Spent in minutes: 25 minutes  Procedures:  2D echo  Consultants:   Cardiology  Antimicrobials:   Anti-infectives (From admission, onward)   None         Medications  Scheduled Meds:  allopurinol  100 mg Oral Daily   apixaban  5 mg Oral BID   Chlorhexidine Gluconate Cloth  6 each Topical Daily   colchicine  0.6 mg Oral Daily   diltiazem  180 mg Oral Daily   dronedarone  400 mg Oral BID WC   fenofibrate  160 mg Oral Daily   finasteride  5 mg Oral Daily   furosemide  40 mg Intravenous Q12H   insulin aspart  0-9 Units Subcutaneous TID WC   magnesium oxide  400 mg Oral Daily   metoprolol succinate  25 mg Oral Daily   pantoprazole  40 mg Oral BID   pravastatin  40 mg Oral Daily   predniSONE  40 mg Oral Q breakfast   sodium chloride flush  10-40 mL Intracatheter Q12H   tamsulosin  0.4 mg Oral Daily   Continuous Infusions: PRN Meds:.ondansetron **OR** ondansetron (ZOFRAN) IV, sodium chloride flush      Subjective:   Brandon Sherman was seen and examined today.  States shortness of breath is improving, still has swelling in his legs.  Left ankle/foot swelling, states feels a little better with his gout flare.  On 2 L O2 via nasal cannula.  No chest pain. Patient denies dizziness,  abdominal pain, N/V/D/C, new weakness, numbess, tingling.  Objective:   Vitals:   05/21/20 0623 05/21/20 0729 05/21/20 1032 05/21/20 1059  BP: (!) 142/76 (!) 144/76 (!) 142/80 (!) 146/85  Pulse:  78 67 73  Resp:  20 18 20   Temp:  98.2 F (36.8 C)  (!) 97.5 F (36.4 C)  TempSrc:  Oral  Oral  SpO2:  97% 98% 98%  Weight:      Height:        Intake/Output Summary (Last 24 hours) at 05/21/2020 1149 Last data filed at 05/21/2020 1030 Gross per 24 hour  Intake 360 ml  Output 4200 ml  Net -3840 ml     Wt Readings  from Last 3 Encounters:  05/21/20 97 kg  05/19/20 100.5 kg  05/12/20 100.5 kg   Physical Exam  General: Alert and oriented x 3, NAD  Cardiovascular: S1 S2 clear, RRR.  Respiratory: CTAB, no wheezing, rales or rhonchi  Gastrointestinal: Soft, nontender, nondistended, NBS  Ext: 2+ pitting edema bilaterally. left  LLE/foot +  Neuro: no new deficits  Musculoskeletal: No cyanosis, clubbing  Skin: No rashes  Psych: Normal affect and demeanor, alert and oriented x3      Data Reviewed:  I have personally reviewed following labs and imaging studies  Micro Results Recent Results (from the past 240 hour(s))  SARS CORONAVIRUS 2 (TAT 6-24 HRS) Nasopharyngeal Nasopharyngeal Swab     Status: None   Collection Time: 05/15/20 11:00 AM   Specimen: Nasopharyngeal Swab  Result Value Ref Range Status   SARS Coronavirus 2 NEGATIVE NEGATIVE Final    Comment: (NOTE) SARS-CoV-2 target nucleic acids are NOT DETECTED.  The SARS-CoV-2 RNA is generally detectable in upper and lower respiratory specimens during the acute phase of infection. Negative results do not preclude SARS-CoV-2 infection, do not rule out co-infections with other pathogens, and should not be used as the sole basis for treatment or other patient management decisions. Negative results must be combined with clinical observations, patient history, and epidemiological information. The expected result is Negative.  Fact Sheet for Patients: SugarRoll.be  Fact Sheet for Healthcare Providers: https://www.woods-mathews.com/  This test is not yet approved or cleared by the [place] FDA and  has been authorized for detection and/or diagnosis of SARS-CoV-2 by FDA under an Emergency Use Authorization (EUA). This EUA will remain  in effect (meaning this test can be used) for the duration of the COVID-19 declaration under Se ction 564(b)(1) of the Act, 21 U.S.C. section 360bbb-3(b)(1),  unless the authorization is terminated or revoked sooner.  Performed at [facility], [place] [address], [place], [place] [address]   SARS Coronavirus 2 by RT PCR (hospital order, performed in [facility] hospital lab) Nasopharyngeal Nasopharyngeal Swab     Status: None   Collection Time: 05/20/20  1:56 AM   Specimen: Nasopharyngeal Swab  Result Value Ref Range Status   SARS Coronavirus 2 NEGATIVE NEGATIVE Final    Comment: (NOTE) SARS-CoV-2 target nucleic acids are NOT DETECTED.  The SARS-CoV-2 RNA is generally detectable in upper and lower respiratory specimens during the acute phase of infection. The lowest concentration of SARS-CoV-2 viral copies this assay can detect is 250 copies / mL. A negative result does not preclude SARS-CoV-2 infection and should not be used as the sole basis for treatment or other patient management decisions.  A negative result may occur with improper specimen collection / handling, submission of specimen other than nasopharyngeal swab, presence of viral mutation(s) within the areas targeted by this assay, and inadequate number of viral copies (<250 copies / mL). A negative result must be combined with clinical observations, patient history, and epidemiological information.  Fact Sheet for Patients:   StrictlyIdeas.no  Fact Sheet for Healthcare Providers: BankingDealers.co.za  This test is not yet approved or  cleared by the [place] FDA and has been authorized for detection and/or diagnosis of SARS-CoV-2 by FDA under an Emergency Use Authorization (EUA).  This EUA will remain in effect (meaning this test can be used) for the duration of the COVID-19 declaration under Section 564(b)(1) of the Act, 21 U.S.C. section 360bbb-3(b)(1), unless the authorization is terminated or revoked sooner.  Performed at [facility], [place] [address], [place],  [address]     Radiology Reports DG  Chest [place] 1 View  Result Date: 05/20/2020 CLINICAL DATA:  Initial evaluation for acute shortness of breath. EXAM: PORTABLE CHEST 1 VIEW COMPARISON:  Prior radiograph from 03/23/2020. FINDINGS: Right-sided Port-A-Cath in place with tip overlying the cavoatrial junction.  Mild cardiomegaly, stable. Mediastinal silhouette within normal limits. Lungs mildly hypoinflated. Diffuse vascular and interstitial prominence compatible with pulmonary interstitial edema. Possible trace layering fluid noted along the right minor fissure. Blunting of the right costophrenic angle also suggestive of a small pleural effusion. No consolidative airspace disease. No pneumothorax. No acute osseous finding. IMPRESSION: Cardiomegaly with mild diffuse pulmonary interstitial edema and small right pleural effusion, suggesting CHF. Electronically Signed   By: Jeannine Boga M.D.   On: 05/20/2020 02:15   Myocardial Perfusion Imaging  Result Date: 05/05/2020  The left ventricular ejection fraction is normal (55-65%).  Nuclear stress EF: 63%.  There was no ST segment deviation noted during stress.  The study is normal.  This is a low risk study.  Normal pharmacologic nuclear study with no evidence for prior infarct or ischemia. Normal LVEF.   ECHOCARDIOGRAM LIMITED  Result Date: 05/20/2020    ECHOCARDIOGRAM LIMITED REPORT   Patient Name:   Brandon Sherman Date of Exam: 05/20/2020 Medical Rec #:  [id number]         Height:       69.0 in Accession #:    [id number]        Weight:       222.0 lb Date of Birth:  15-Jun-1946          BSA:          2.160 m Patient Age:    69 years          BP:           145/83 mmHg Patient Gender: M                 HR:           73 bpm. Exam Location:  Inpatient Procedure: Limited Echo, Cardiac Doppler, Color Doppler and Intracardiac            Opacification Agent Indications:    CHF-Acute diastolic  History:        Patient has prior history of Echocardiogram examinations, most                 recent  04/13/2020. CHF, Arrythmias:Atrial Fibrillation; Risk                 Factors:Hypertension and Dyslipidemia.  Sonographer:    Clayton Lefort RDCS (AE) Referring Phys: 1993 [facility] G BARRETT  Sonographer Comments: Suboptimal apical window and patient is morbidly obese. IMPRESSIONS  1. Left ventricular ejection fraction, by estimation, is 55 to 60%. The left ventricle has normal function. The left ventricle has no regional wall motion abnormalities. There is moderate left ventricular hypertrophy. Left ventricular diastolic parameters are indeterminate.  2. Right ventricular systolic function is normal. The right ventricular size is normal. There is moderately elevated pulmonary artery systolic pressure. The estimated right ventricular systolic pressure is 37.1 mmHg.  3. The mitral valve is grossly normal. Mild mitral valve regurgitation.  4. The aortic valve is tricuspid. Aortic valve regurgitation is mild. Mild to moderate aortic valve sclerosis/calcification is present, without any evidence of aortic stenosis.  5. Aortic dilatation noted. 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  6. The inferior vena cava is normal in size with <50% respiratory variability, suggesting right atrial pressure of 8 mmHg. Comparison(s): A prior study was performed on 04/13/20. No significant change from prior study. Prior images reviewed side by side. FINDINGS  Left Ventricle: Left ventricular ejection fraction, by estimation, is 55 to 60%. The left  ventricle has normal function. The left ventricle has no regional wall motion abnormalities. Definity contrast agent was given IV to delineate the left ventricular  endocardial borders. There is moderate left ventricular hypertrophy. Right Ventricle: The right ventricular size is normal. Right ventricular systolic function is normal. There is moderately elevated pulmonary artery systolic pressure. The tricuspid regurgitant velocity is 3.16 m/s, and with an assumed right atrial  pressure of 10 mmHg, the estimated right ventricular systolic pressure is 63.8 mmHg. Left Atrium: Left atrial size was not well visualized. Right Atrium: Right atrial size was not well visualized. Pericardium: There is no evidence of pericardial effusion. Mitral Valve: The mitral valve is grossly normal. Mild mitral valve regurgitation. Tricuspid Valve: The tricuspid valve is not well visualized. Tricuspid valve regurgitation is mild. Aortic Valve: The aortic valve is tricuspid. Aortic valve regurgitation is mild. Mild to moderate aortic valve sclerosis/calcification is present, without any evidence of aortic stenosis. There is moderate calcification of the aortic valve. Pulmonic Valve: The pulmonic valve was not well visualized. Pulmonic valve regurgitation is trivial. Aorta: Aortic dilatation noted. 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 Venous: The inferior vena cava is normal in size with less than 50% respiratory variability, suggesting right atrial pressure of 8 mmHg. IAS/Shunts: The interatrial septum was not assessed.  LEFT VENTRICLE PLAX 2D LVIDd:         4.40 cm LVIDs:         2.70 cm LV PW:         1.50 cm LV IVS:        1.50 cm LVOT diam:     2.00 cm LVOT Area:     3.14 cm  IVC IVC diam: 2.00 cm LEFT ATRIUM         Index LA diam:    4.90 cm 2.27 cm/m   AORTA Ao Root diam: 4.10 cm Ao Asc diam:  3.80 cm TRICUSPID VALVE TR Peak grad:   39.9 mmHg TR Vmax:        316.00 cm/s  SHUNTS Systemic Diam: 2.00 cm Cherlynn Kaiser MD Electronically signed by Cherlynn Kaiser MD Signature Date/Time: 05/20/2020/5:34:43 PM    Final     Lab Data:  CBC: Recent Labs  Lab 05/20/20 0133 05/21/20 0502  WBC 6.8 4.9  NEUTROABS 5.1  --   HGB 9.5* 7.9*  HCT 29.4* 24.4*  MCV 112.6* 110.9*  PLT 86* 93*   Basic Metabolic Panel: Recent Labs  Lab 05/20/20 0133 05/20/20 0227 05/21/20 0502  NA 139  --  139  K 3.9  --  4.0  CL 109  --  107  CO2 21*  --  23  GLUCOSE 156*  --  110*  BUN 19  --   18  CREATININE 1.63*  --  1.50*  CALCIUM 9.1  --  8.8*  MG  --  1.7  --    GFR: Estimated Creatinine Clearance: 51.1 mL/min (A) (by C-G formula based on SCr of 1.5 mg/dL (H)). Liver Function Tests: Recent Labs  Lab 05/20/20 0133  AST 23  ALT 17  ALKPHOS 42  BILITOT 0.8  PROT 6.2*  ALBUMIN 3.6   No results for input(s): LIPASE, AMYLASE in the last 168 hours. No results for input(s): AMMONIA in the last 168 hours. Coagulation Profile: No results for input(s): INR, PROTIME in the last 168 hours. Cardiac Enzymes: No results for input(s): CKTOTAL, CKMB, CKMBINDEX, TROPONINI in the last 168 hours. BNP (last 3 results) No  results for input(s): PROBNP in the last 8760 hours. HbA1C: No results for input(s): HGBA1C in the last 72 hours. CBG: Recent Labs  Lab 05/20/20 1259 05/20/20 1724 05/20/20 2104 05/21/20 0622 05/21/20 1100  GLUCAP 77 104* 172* 102* 108*   Lipid Profile: No results for input(s): CHOL, HDL, LDLCALC, TRIG, CHOLHDL, LDLDIRECT in the last 72 hours. Thyroid Function Tests: Recent Labs    05/20/20 0804  TSH 2.131   Anemia Panel: No results for input(s): VITAMINB12, FOLATE, FERRITIN, TIBC, IRON, RETICCTPCT in the last 72 hours. Urine analysis:    Component Value Date/Time   COLORURINE YELLOW 03/15/2020 1942   APPEARANCEUR CLEAR 03/15/2020 1942   LABSPEC 1.010 03/15/2020 1942   PHURINE 7.0 03/15/2020 1942   GLUCOSEU NEGATIVE 03/15/2020 1942   HGBUR SMALL (A) 03/15/2020 1942   BILIRUBINUR NEGATIVE 03/15/2020 1942   KETONESUR NEGATIVE 03/15/2020 1942   PROTEINUR NEGATIVE 03/15/2020 1942   NITRITE NEGATIVE 03/15/2020 1942   LEUKOCYTESUR NEGATIVE 03/15/2020 1942     Adonia Porada M.D. [facility] 05/21/2020, 11:49 AM   Call night coverage person covering after 7pm

## 2020-05-21 NOTE — TOC Progression Note (Signed)
Transition of Care [facility] - Progression Note    Patient Details  Name: Brandon Sherman MRN: [id number] Date of Birth: 07-13-1946  [facility] CM/SW Contact  Zenon Mayo, RN Phone Number: 05/21/2020, 12:49 PM  Clinical Narrative:    NCM spoke with patient, asked if he would like for NCM to set up [facility] for CHF disease management, he states no, he lives with is wife at home, she will transport him home at dc, he has no issues with transportation to MD apt, or medications,  He weighs him self daily and eats a low sodium diet. TOC will continue to follow for dc needs.        Expected Discharge Plan and Services                                                 Social Determinants of Health (SDOH) Interventions Social Connections Interventions: Intervention Not Indicated Transportation Interventions: Intervention Not Indicated  Readmission Risk Interventions No flowsheet data found.

## 2020-05-21 NOTE — Telephone Encounter (Signed)
Left message to call back  

## 2020-05-22 ENCOUNTER — Other Ambulatory Visit: Payer: Self-pay | Admitting: Internal Medicine

## 2020-05-22 LAB — BASIC METABOLIC PANEL
Anion gap: 10 (ref 5–15)
BUN: 24 mg/dL — ABNORMAL HIGH (ref 8–23)
CO2: 24 mmol/L (ref 22–32)
Calcium: 9 mg/dL (ref 8.9–10.3)
Chloride: 104 mmol/L (ref 98–111)
Creatinine, Ser: 1.7 mg/dL — ABNORMAL HIGH (ref 0.61–1.24)
GFR calc Af Amer: 46 mL/min — ABNORMAL LOW (ref >60)
GFR calc non Af Amer: 39 mL/min — ABNORMAL LOW (ref >60)
Glucose, Bld: 107 mg/dL — ABNORMAL HIGH (ref 70–99)
Potassium: 3.8 mmol/L (ref 3.5–5.1)
Sodium: 138 mmol/L (ref 135–145)

## 2020-05-22 LAB — CBC
HCT: 25.8 % — ABNORMAL LOW (ref 39.0–52.0)
Hemoglobin: 8.5 g/dL — ABNORMAL LOW (ref 13.0–17.0)
MCH: 36.5 pg — ABNORMAL HIGH (ref 26.0–34.0)
MCHC: 32.9 g/dL (ref 30.0–36.0)
MCV: 110.7 fL — ABNORMAL HIGH (ref 80.0–100.0)
Platelets: 140 10*3/uL — ABNORMAL LOW (ref 150–400)
RBC: 2.33 MIL/uL — ABNORMAL LOW (ref 4.22–5.81)
RDW: 19.6 % — ABNORMAL HIGH (ref 11.5–15.5)
WBC: 6.9 10*3/uL (ref 4.0–10.5)
nRBC: 0 % (ref 0.0–0.2)

## 2020-05-22 LAB — GLUCOSE, CAPILLARY: Glucose-Capillary: 99 mg/dL (ref 70–99)

## 2020-05-22 MED ORDER — HEPARIN SOD (PORK) LOCK FLUSH 100 UNIT/ML IV SOLN
500.0000 [IU] | INTRAVENOUS | Status: AC | PRN
  Administered 2020-05-22: 500 [IU]
  Filled 2020-05-22: qty 5

## 2020-05-22 MED ORDER — FUROSEMIDE 40 MG PO TABS: 40.0000 mg | ORAL_TABLET | Freq: Every day | ORAL | Status: DC

## 2020-05-22 MED ORDER — FUROSEMIDE 40 MG PO TABS: 20.0000 mg | ORAL_TABLET | Freq: Every day | ORAL | 3 refills | Status: DC

## 2020-05-22 MED ORDER — COLCHICINE 0.6 MG PO TABS: 0.6000 mg | ORAL_TABLET | Freq: Every day | ORAL | 0 refills | Status: DC | PRN

## 2020-05-22 MED ORDER — PREDNISONE 20 MG PO TABS: 40.0000 mg | ORAL_TABLET | Freq: Every day | ORAL | 0 refills | Status: AC

## 2020-05-22 MED ORDER — FUROSEMIDE 40 MG PO TABS: 40.0000 mg | ORAL_TABLET | Freq: Every day | ORAL | 3 refills | Status: DC

## 2020-05-22 NOTE — Progress Notes (Signed)
SATURATION QUALIFICATIONS: (This note is used to comply with regulatory documentation for home oxygen)  Patient Saturations on Room Air at Rest = 99%  Patient Saturations on Room Air while Ambulating = 95%  Patient Saturations on 0 Liters of oxygen while Ambulating = 95%  Please briefly explain why patient needs home oxygen:  Patient does not meet the requirement for home oxygen. He is able to ambulate on room air and expresses no shortness of breathe.

## 2020-05-22 NOTE — Discharge Summary (Signed)
Physician Discharge Summary   Patient ID: Brandon Sherman MRN: [id number] DOB/AGE: May 08, 1946 74 y.o.  Admit date: 05/20/2020 Discharge date: 05/22/2020  Primary Care Physician:  Brandon Reel, PA   Recommendations for Outpatient Follow-up:  1. Follow up with PCP in 1-2 weeks 2. Follow bmet in 1 week, 3. Lisinopril, Toprol, dronedarone discontinued 4. Lasix increased to 40 mg twice daily 5. Continue colchicine 0.6 mg daily for 3 days, prednisone 40 mg for 3 more days for the acute gout flare  Home Health: None, patient ambulating in the room without any difficulty Equipment/Devices:   Discharge Condition: stable CODE STATUS: FULL  Diet recommendation: Carb modified, low-sodium diet   Discharge Diagnoses:    . Acute diastolic CHF (congestive heart failure) ([place]) . Acute left foot GOUT . Essential hypertension . Intrahepatic cholangiocarcinoma ([place]), on chemo . Paroxysmal atrial fibrillation (H[facility]) . 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   apixaban 5 MG Tabs tablet Commonly known as: ELIQUIS Take 1 tablet (5 mg total) by mouth 2 (two) times daily.   colchicine 0.6 MG tablet Take 1 tablet (0.6 mg total) by mouth daily as needed (gout flare). Take daily for 3 days, then as needed for gout flare   diltiazem 180 MG 24 hr capsule Commonly known as: CARDIZEM CD Take 1 capsule (180  mg total) by mouth daily.   fenofibrate 145 MG tablet Commonly known as: TRICOR Take 145 mg by mouth daily.   finasteride 5 MG tablet Commonly known as: PROSCAR Take 5 mg by mouth daily.   furosemide 40 MG tablet Commonly known as: LASIX Take 1 tablet (40 mg total) by mouth daily. What changed:   medication strength  how much to take  when to take this   lidocaine-prilocaine cream Commonly known as: EMLA Apply 1 application topically as needed.   magnesium oxide 400 MG tablet Commonly known as: MAG-OX TAKE 1 TABLET BY MOUTH EVERY DAY   metFORMIN 500 MG tablet Commonly known as: GLUCOPHAGE Take 500 mg by mouth 2 (two) times daily with a meal.   ondansetron 8 MG tablet Commonly known as: Zofran Take 1 tablet (8 mg total) by mouth 2 (two) times daily as needed. Start on the third day after chemotherapy.   pantoprazole 40 MG tablet Commonly known as: PROTONIX Take 1 tablet (40 mg total) by mouth 2 (two) times daily.   pravastatin 40 MG tablet Commonly known as: PRAVACHOL Take 40 mg by mouth daily.   predniSONE 20 MG tablet Commonly known as: DELTASONE Take 2 tablets (40 mg total) by mouth daily with breakfast for 3 days. Start taking on: May 23, 2020   sucralfate 1 g tablet Commonly known as: CARAFATE TAKE 1 TABLET BY MOUTH EVERY 6 HOURS AS NEEDED.   tamsulosin 0.4 MG Caps capsule Commonly known as: FLOMAX Take 0.4 mg by mouth daily.  Vitamin D (Ergocalciferol) 1.25 MG (50000 UNIT) Caps capsule Commonly known as: DRISDOL Take 50,000 Units by mouth every 7 (seven) days.        Brief H and P: For complete details please refer to admission H and P, but in brief *Brandon Staggs Sizemoreis a 74 y.o.malewithhistory of recently diagnosed A. fib but on 3 months ago when patient was having a Port-A-Cath placement for cholangiocarcinoma had last chemotherapy 2 weeks ago had cardioversion a day prior to admission on 6/30 following which patient went home started  getting some cough and went was about lying on the bed became acutely short of breath and was brought to the ER. Denied any chest pain. Denied fever or chills.  Patient was having bilateral lower extremity edema and was taking Lasix every other day.  In ED, patient was placed on 4 L O2 (per patient, not on home O2), chest x-ray showed pulmonary edema, given Lasix 80 mg IV x1.  Creatinine 1.6, hemoglobin 9.5, BNP 428.  Patient was admitted for acute CHF  Hospital Course:    Acute diastolic CHF (congestive heart failure) ([place]), acute respiratory failure with hypoxia -Presenting with elevated BNP, pulmonary edema on chest x-ray, acute shortness of breath with lower extremity edema - 2D echo repeated 7/1 showed EF of 55 to 74%, diastolic parameters indeterminate -Patient received Lasix 80 mg IV x1, continue Lasix 40 mg every 12 hours IV, negative balance of 8.7 L at the time of discharge -Weight 222lbs at the time of admission, weight at the time of discharge 206lbs  -D-dimer 1.6, creatinine trended up to 1.6, patient has been on Eliquis, less likelihood of PE however possibility due to history of malignancy, hypercoagulable state -Patient was placed on 4 L O2 at the time of admission, ambulating in the room without any O2 this morning. -Home O2 evaluation prior to discharge -Lasix increased from 20 mg daily to 40 mg daily at the time of discharge.  Needs bmet outpatient to follow renal function.  Patient was cleared by cardiology to discharge home.   Left foot GOUT flare -Patient reports has history of gout, has flare in left foot/ankle -Unable to place on NSAIDs due to renal insufficiency -Continue colchicine, prednisone for 3 more days.  Continue allopurinol.      Essential hypertension -Continue Cardizem.  Hold beta-blocker     Intrahepatic cholangiocarcinoma (HCC) -Followed by Dr. Burr Medico, received chemotherapy 2 weeks ago    Atrial fibrillation ([place]), paroxysmal -Underwent recent  cardioversion on 05/19/2020 -Cardiology following, presently in sinus rhythm,  -Cardiology recommended hold beta-blocker, dronedarone -Continue Cardizem and apixaban -outpatient follow-up with cardiology  Diabetes mellitus type 2 -Continue sliding scale insulin while inpatient  Chronic anemia, thrombocytopenia -Likely secondary to bone marrow depression, chemotherapy, baseline H&H 9-10 -Hemoglobin 8.5 at the time of discharge.  Acute kidney injury on likely CKD stage II -Creatinine 1.6, likely due to lisinopril, diuresis, acute diastolic CHF with decreased renal perfusion -Baseline creatinine 1.1-1.3.  Creatinine 1.3 on 04/20/2020 -Patient was placed on IV Lasix for diuresis, creatinine 1.7 at the time of discharge. Transition to oral Lasix, follow be met outpatient  Obesity Estimated body mass index is 31.57 kg/m as calculated from the following:   Height as of this encounter: 5' 9"  (1.753 m).   Weight as of this encounter: 97 kg.  Day of Discharge S: No acute complaints, feels close to baseline, ambulating in the room without any O2.  BP (!) 152/75 (BP Location: Right Arm)   Pulse 81  Temp 97.7 F (36.5 C) (Oral)   Resp 16   Ht 5' 9"  (1.753 m)   Wt 93.7 kg Comment: scale a  SpO2 98%   BMI 30.51 kg/m   Physical Exam: General: Alert and awake oriented x3 not in any acute distress. HEENT: anicteric sclera, pupils reactive to light and accommodation CVS: S1-S2 clear no murmur rubs or gallops Chest: clear to auscultation bilaterally, no wheezing rales or rhonchi Abdomen: soft nontender, nondistended, normal bowel sounds Extremities: no cyanosis, clubbing.  1+ edema Left >right Neuro: Cranial nerves II-XII intact, no focal neurological deficits    Get Medicines reviewed and adjusted: Please take all your medications with you for your next visit with your Primary MD  Please request your Primary MD to go over all hospital tests and procedure/radiological results at  the follow up. Please ask your Primary MD to get all Hospital records sent to his/her office.  If you experience worsening of your admission symptoms, develop shortness of breath, life threatening emergency, suicidal or homicidal thoughts you must seek medical attention immediately by calling 911 or calling your MD immediately  if symptoms less severe.  You must read complete instructions/literature along with all the possible adverse reactions/side effects for all the Medicines you take and that have been prescribed to you. Take any new Medicines after you have completely understood and accept all the possible adverse reactions/side effects.   Do not drive when taking pain medications.   Do not take more than prescribed Pain, Sleep and Anxiety Medications  Special Instructions: If you have smoked or chewed Tobacco  in the last 2 yrs please stop smoking, stop any regular Alcohol  and or any Recreational drug use.  Wear Seat belts while driving.  Please note  You were cared for by a hospitalist during your hospital stay. Once you are discharged, your primary care physician will handle any further medical issues. Please note that NO REFILLS for any discharge medications will be authorized once you are discharged, as it is imperative that you return to your primary care physician (or establish a relationship with a primary care physician if you do not have one) for your aftercare needs so that they can reassess your need for medications and monitor your lab values.   The results of significant diagnostics from this hospitalization (including imaging, microbiology, ancillary and laboratory) are listed below for reference.      Procedures/Studies:  DG Chest Port 1 View  Result Date: 05/20/2020 CLINICAL DATA:  Initial evaluation for acute shortness of breath. EXAM: PORTABLE CHEST 1 VIEW COMPARISON:  Prior radiograph from 03/23/2020. FINDINGS: Right-sided Port-A-Cath in place with tip overlying  the cavoatrial junction. Mild cardiomegaly, stable. Mediastinal silhouette within normal limits. Lungs mildly hypoinflated. Diffuse vascular and interstitial prominence compatible with pulmonary interstitial edema. Possible trace layering fluid noted along the right minor fissure. Blunting of the right costophrenic angle also suggestive of a small pleural effusion. No consolidative airspace disease. No pneumothorax. No acute osseous finding. IMPRESSION: Cardiomegaly with mild diffuse pulmonary interstitial edema and small right pleural effusion, suggesting CHF. Electronically Signed   By: Jeannine Boga M.D.   On: 05/20/2020 02:15   Myocardial Perfusion Imaging  Result Date: 05/05/2020  The left ventricular ejection fraction is normal (55-65%).  Nuclear stress EF: 63%.  There was no ST segment deviation noted during stress.  The study is normal.  This is a low risk study.  Normal pharmacologic nuclear study with no evidence for prior infarct or ischemia. Normal  LVEF.   ECHOCARDIOGRAM LIMITED  Result Date: 05/20/2020    ECHOCARDIOGRAM LIMITED REPORT   Patient Name:   Brandon Sherman Date of Exam: 05/20/2020 Medical Rec #:  [id number]         Height:       69.0 in Accession #:    [id number]        Weight:       222.0 lb Date of Birth:  [birth year]          BSA:          2.160 m Patient Age:    79 years          BP:           145/83 mmHg Patient Gender: M                 HR:           73 bpm. Exam Location:  Inpatient Procedure: Limited Echo, Cardiac Doppler, Color Doppler and Intracardiac            Opacification Agent Indications:    CHF-Acute diastolic  History:        Patient has prior history of Echocardiogram examinations, most                 recent 04/13/2020. CHF, Arrythmias:Atrial Fibrillation; Risk                 Factors:Hypertension and Dyslipidemia.  Sonographer:    Clayton Lefort RDCS (AE) Referring Phys: [facility]TT  Sonographer Comments: Suboptimal apical window and patient is  morbidly obese. IMPRESSIONS  1. Left ventricular ejection fraction, by estimation, is 55 to 60%. The left ventricle has normal function. The left ventricle has no regional wall motion abnormalities. There is moderate left ventricular hypertrophy. Left ventricular diastolic parameters are indeterminate.  2. Right ventricular systolic function is normal. The right ventricular size is normal. There is moderately elevated pulmonary artery systolic pressure. The estimated right ventricular systolic pressure is 09.6 mmHg.  3. The mitral valve is grossly normal. Mild mitral valve regurgitation.  4. The aortic valve is tricuspid. Aortic valve regurgitation is mild. Mild to moderate aortic valve sclerosis/calcification is present, without any evidence of aortic stenosis.  5. Aortic dilatation noted. 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  6. The inferior vena cava is normal in size with <50% respiratory variability, suggesting right atrial pressure of 8 mmHg. Comparison(s): A prior study was performed on 04/13/20. No significant change from prior study. Prior images reviewed side by side. FINDINGS  Left Ventricle: Left ventricular ejection fraction, by estimation, is 55 to 60%. The left ventricle has normal function. The left ventricle has no regional wall motion abnormalities. Definity contrast agent was given IV to delineate the left ventricular  endocardial borders. There is moderate left ventricular hypertrophy. Right Ventricle: The right ventricular size is normal. Right ventricular systolic function is normal. There is moderately elevated pulmonary artery systolic pressure. The tricuspid regurgitant velocity is 3.16 m/s, and with an assumed right atrial pressure of 10 mmHg, the estimated right ventricular systolic pressure is 28.3 mmHg. Left Atrium: Left atrial size was not well visualized. Right Atrium: Right atrial size was not well visualized. Pericardium: There is no evidence of pericardial  effusion. Mitral Valve: The mitral valve is grossly normal. Mild mitral valve regurgitation. Tricuspid Valve: The tricuspid valve is not well visualized. Tricuspid valve regurgitation is mild. Aortic Valve: The aortic valve is tricuspid. Aortic  valve regurgitation is mild. Mild to moderate aortic valve sclerosis/calcification is present, without any evidence of aortic stenosis. There is moderate calcification of the aortic valve. Pulmonic Valve: The pulmonic valve was not well visualized. Pulmonic valve regurgitation is trivial. Aorta: Aortic dilatation noted. 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 Venous: The inferior vena cava is normal in size with less than 50% respiratory variability, suggesting right atrial pressure of 8 mmHg. IAS/Shunts: The interatrial septum was not assessed.  LEFT VENTRICLE PLAX 2D LVIDd:         4.40 cm LVIDs:         2.70 cm LV PW:         1.50 cm LV IVS:        1.50 cm LVOT diam:     2.00 cm LVOT Area:     3.14 cm  IVC IVC diam: 2.00 cm LEFT ATRIUM         Index LA diam:    4.90 cm 2.27 cm/m   AORTA Ao Root diam: 4.10 cm Ao Asc diam:  3.80 cm TRICUSPID VALVE TR Peak grad:   39.9 mmHg TR Vmax:        316.00 cm/s  SHUNTS Systemic Diam: 2.00 cm Cherlynn Kaiser MD Electronically signed by Cherlynn Kaiser MD Signature Date/Time: 05/20/2020/5:34:43 PM    Final       LAB RESULTS: Basic Metabolic Panel: Recent Labs  Lab 05/20/20 0133 05/20/20 0227 05/21/20 0502 05/22/20 0526  NA   < >  --  139 138  K   < >  --  4.0 3.8  CL   < >  --  107 104  CO2   < >  --  23 24  GLUCOSE   < >  --  110* 107*  BUN   < >  --  18 24*  CREATININE   < >  --  1.50* 1.70*  CALCIUM   < >  --  8.8* 9.0  MG  --  1.7  --   --    < > = values in this interval not displayed.   Liver Function Tests: Recent Labs  Lab 05/20/20 0133  AST 23  ALT 17  ALKPHOS 42  BILITOT 0.8  PROT 6.2*  ALBUMIN 3.6   No results for input(s): LIPASE, AMYLASE in the last 168 hours. No  results for input(s): AMMONIA in the last 168 hours. CBC: Recent Labs  Lab 05/20/20 0133 05/20/20 0133 05/21/20 0502 05/21/20 0502 05/22/20 0615  WBC 6.8   < > 4.9  --  6.9  NEUTROABS 5.1  --   --   --   --   HGB 9.5*   < > 7.9*  --  8.5*  HCT 29.4*   < > 24.4*  --  25.8*  MCV 112.6*   < > 110.9*   < > 110.7*  PLT 86*   < > 93*  --  140*   < > = values in this interval not displayed.   Cardiac Enzymes: No results for input(s): CKTOTAL, CKMB, CKMBINDEX, TROPONINI in the last 168 hours. BNP: Invalid input(s): POCBNP CBG: Recent Labs  Lab 05/21/20 2102 05/22/20 0605  GLUCAP 151* 99       Disposition and Follow-up: Discharge Instructions    (HEART FAILURE PATIENTS) Call MD:  Anytime you have any of the following symptoms: 1) 3 pound weight gain in 24 hours or 5 pounds in 1 week 2) shortness  of breath, with or without a dry hacking cough 3) swelling in the hands, feet or stomach 4) if you have to sleep on extra pillows at night in order to breathe.   Complete by: As directed    Diet Carb Modified   Complete by: As directed    Increase activity slowly   Complete by: As directed        DISPOSITION: Home   DISCHARGE FOLLOW-UP  Follow-up Information    Brandon Reel, PA. Schedule an appointment as soon as possible for a visit in 1 week(s).   Specialty: Family Medicine Contact information: [place] [address] [phone]        Lorretta Harp, MD. Schedule an appointment as soon as possible for a visit in 1 week(s).   Specialties: Cardiology, Radiology Why: We will arrange for follow-up and contact you. Contact information: [address] [place] [address] [phone]                Time coordinating discharge:  35 minutes  Signed:   Estill Cotta M.D. [facility] 05/22/2020, 10:17 AM

## 2020-05-22 NOTE — Progress Notes (Signed)
Progress Note  Patient Name: Brandon Sherman Date of Encounter: 05/22/2020  [facility] Cardiologist: Quay Burow, MD   Subjective   Dyspnea improving; no CP  Inpatient Medications    Scheduled Meds: . allopurinol  100 mg Oral Daily  . apixaban  5 mg Oral BID  . Chlorhexidine Gluconate Cloth  6 each Topical Daily  . colchicine  0.6 mg Oral Daily  . diltiazem  180 mg Oral Daily  . dronedarone  400 mg Oral BID WC  . fenofibrate  160 mg Oral Daily  . finasteride  5 mg Oral Daily  . furosemide  40 mg Intravenous Q12H  . insulin aspart  0-9 Units Subcutaneous TID WC  . magnesium oxide  400 mg Oral Daily  . metoprolol succinate  25 mg Oral Daily  . pantoprazole  40 mg Oral BID  . pravastatin  40 mg Oral Daily  . predniSONE  40 mg Oral Q breakfast  . sodium chloride flush  10-40 mL Intracatheter Q12H  . tamsulosin  0.4 mg Oral Daily   Continuous Infusions:  PRN Meds: ondansetron **OR** ondansetron (ZOFRAN) IV, sodium chloride flush   Vital Signs    Vitals:   05/21/20 1656 05/21/20 1937 05/22/20 0300 05/22/20 0826  BP: (!) 154/70 (!) 146/88 134/79 (!) 152/75  Pulse: 76 (!) 57 68 81  Resp: 20 20 20 16   Temp: 98 F (36.7 C) 98.1 F (36.7 C) 97.7 F (36.5 C)   TempSrc: Oral Oral Oral   SpO2: 96% 97% 98% 98%  Weight:   93.7 kg   Height:        Intake/Output Summary (Last 24 hours) at 05/22/2020 0857 Last data filed at 05/22/2020 0820 Gross per 24 hour  Intake 1060 ml  Output 6175 ml  Net -5115 ml   Last 3 Weights 05/22/2020 05/21/2020 05/20/2020  Weight (lbs) 206 lb 9.6 oz 213 lb 12.8 oz 222 lb  Weight (kg) 93.713 kg 96.979 kg 100.699 kg      Telemetry    Atrial fibrillation- Personally Reviewed   Physical Exam   GEN: WD obese NAD Neck: supple Cardiac: irregular Respiratory: CTA GI: Soft, NT/ND MS: trace to 1 + edema Neuro:  Grossly intact Psych: Normal affect   Labs    High Sensitivity Troponin:   Recent Labs  Lab 05/20/20 0133  05/20/20 0227  TROPONINIHS 42* 40*      Chemistry Recent Labs  Lab 05/20/20 0133 05/21/20 0502 05/22/20 0526  NA 139 139 138  K 3.9 4.0 3.8  CL 109 107 104  CO2 21* 23 24  GLUCOSE 156* 110* 107*  BUN 19 18 24*  CREATININE 1.63* 1.50* 1.70*  CALCIUM 9.1 8.8* 9.0  PROT 6.2*  --   --   ALBUMIN 3.6  --   --   AST 23  --   --   ALT 17  --   --   ALKPHOS 42  --   --   BILITOT 0.8  --   --   GFRNONAA 41* 46* 39*  GFRAA 48* 53* 46*  ANIONGAP 9 9 10      Hematology Recent Labs  Lab 05/20/20 0133 05/21/20 0502 05/22/20 0615  WBC 6.8 4.9 6.9  RBC 2.61* 2.20* 2.33*  HGB 9.5* 7.9* 8.5*  HCT 29.4* 24.4* 25.8*  MCV 112.6* 110.9* 110.7*  MCH 36.4* 35.9* 36.5*  MCHC 32.3 32.4 32.9  RDW 20.0* 19.8* 19.6*  PLT 86* 93* 140*    BNP Recent Labs  Lab 05/20/20 0133  BNP 428.4*     DDimer  Recent Labs  Lab 05/20/20 0804  DDIMER 1.60*     Radiology    ECHOCARDIOGRAM LIMITED  Result Date: 05/20/2020    ECHOCARDIOGRAM LIMITED REPORT   Patient Name:   RENO CLASBY Date of Exam: 05/20/2020 Medical Rec #:  [id number]         Height:       69.0 in Accession #:    [id number]        Weight:       222.0 lb Date of Birth:  1946/04/28          BSA:          2.160 m Patient Age:    74 years          BP:           145/83 mmHg Patient Gender: M                 HR:           73 bpm. Exam Location:  Inpatient Procedure: Limited Echo, Cardiac Doppler, Color Doppler and Intracardiac            Opacification Agent Indications:    CHF-Acute diastolic  History:        Patient has prior history of Echocardiogram examinations, most                 recent 04/13/2020. CHF, Arrythmias:Atrial Fibrillation; Risk                 Factors:Hypertension and Dyslipidemia.  Sonographer:    Clayton Lefort RDCS (AE) Referring Phys: 1993 [facility] G BARRETT  Sonographer Comments: Suboptimal apical window and patient is morbidly obese. IMPRESSIONS  1. Left ventricular ejection fraction, by estimation, is 55 to 60%. The left  ventricle has normal function. The left ventricle has no regional wall motion abnormalities. There is moderate left ventricular hypertrophy. Left ventricular diastolic parameters are indeterminate.  2. Right ventricular systolic function is normal. The right ventricular size is normal. There is moderately elevated pulmonary artery systolic pressure. The estimated right ventricular systolic pressure is 43.1 mmHg.  3. The mitral valve is grossly normal. Mild mitral valve regurgitation.  4. The aortic valve is tricuspid. Aortic valve regurgitation is mild. Mild to moderate aortic valve sclerosis/calcification is present, without any evidence of aortic stenosis.  5. Aortic dilatation noted. 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  6. The inferior vena cava is normal in size with <50% respiratory variability, suggesting right atrial pressure of 8 mmHg. Comparison(s): A prior study was performed on 04/13/20. No significant change from prior study. Prior images reviewed side by side. FINDINGS  Left Ventricle: Left ventricular ejection fraction, by estimation, is 55 to 60%. The left ventricle has normal function. The left ventricle has no regional wall motion abnormalities. Definity contrast agent was given IV to delineate the left ventricular  endocardial borders. There is moderate left ventricular hypertrophy. Right Ventricle: The right ventricular size is normal. Right ventricular systolic function is normal. There is moderately elevated pulmonary artery systolic pressure. The tricuspid regurgitant velocity is 3.16 m/s, and with an assumed right atrial pressure of 10 mmHg, the estimated right ventricular systolic pressure is 54.0 mmHg. Left Atrium: Left atrial size was not well visualized. Right Atrium: Right atrial size was not well visualized. Pericardium: There is no evidence of pericardial effusion. Mitral Valve: The mitral valve is grossly normal. Mild  mitral valve regurgitation. Tricuspid Valve:  The tricuspid valve is not well visualized. Tricuspid valve regurgitation is mild. Aortic Valve: The aortic valve is tricuspid. Aortic valve regurgitation is mild. Mild to moderate aortic valve sclerosis/calcification is present, without any evidence of aortic stenosis. There is moderate calcification of the aortic valve. Pulmonic Valve: The pulmonic valve was not well visualized. Pulmonic valve regurgitation is trivial. Aorta: Aortic dilatation noted. 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 Venous: The inferior vena cava is normal in size with less than 50% respiratory variability, suggesting right atrial pressure of 8 mmHg. IAS/Shunts: The interatrial septum was not assessed.  LEFT VENTRICLE PLAX 2D LVIDd:         4.40 cm LVIDs:         2.70 cm LV PW:         1.50 cm LV IVS:        1.50 cm LVOT diam:     2.00 cm LVOT Area:     3.14 cm  IVC IVC diam: 2.00 cm LEFT ATRIUM         Index LA diam:    4.90 cm 2.27 cm/m   AORTA Ao Root diam: 4.10 cm Ao Asc diam:  3.80 cm TRICUSPID VALVE TR Peak grad:   39.9 mmHg TR Vmax:        316.00 cm/s  SHUNTS Systemic Diam: 2.00 cm Cherlynn Kaiser MD Electronically signed by Cherlynn Kaiser MD Signature Date/Time: 05/20/2020/5:34:43 PM    Final     Patient Profile     75 y.o. 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  Echocardiogram shows normal LV function, moderate left ventricular hypertrophy, moderate pulmonary hypertension, mild mitral regurgitation, mild aortic insufficiency and mildly dilated aortic root.  Assessment & Plan    1 acute diastolic congestive heart failure-I/O-3395; Wt 93.7 kg.  Patient has improved compared to admission.  He has minimal dyspnea with activities now.  I will change Lasix to 40 mg by mouth daily.  Needs fluid restriction and low-sodium diet.  Note  echocardiogram shows normal LV function.  2 paroxysmal atrial fibrillation-as outlined previously patient has converted back to atrial fibrillation.  His heart rate is mildly decreased.  I will discontinue metoprolol but continue Cardizem.  Continue apixaban.  Best option will likely be rate control and anticoagulation.  Therefore I will discontinue dronedarone.    3 acute stage III kidney disease-renal function will need to be followed closely as an outpatient.  4 hypertension-Continue present medications and follow.  5 intrahepatic cholangiocarcinoma  Patient can be discharged from a cardiac standpoint.  Continue present medications as listed in MAR.  Lisinopril, dronedarone and Toprol have been discontinued and Lasix dose increased.  Would have transition of care appointment scheduled with APP in 1 week.  Check potassium and renal function at that time.  Follow-up with Dr. Gwenlyn Found in 3 months.  We will sign off.  Please call with questions.  For questions or updates, please contact [place] Please consult [email or web address] for contact info under        Signed, Kirk Ruths, MD  05/22/2020, 8:57 AM

## 2020-05-22 NOTE — Plan of Care (Signed)

## 2020-05-22 NOTE — Progress Notes (Signed)
Romuald Mccaslin Villari   DOB:1946/07/19   ZH#:[id number]   MHD#:[id number]  Oncology follow up   Subjective: Patient's wife informed us about patient's admission.  Chart reviewed.  Patient was admitted for congestive heart failure, he is feeling much better with diuretics, possible going home tomorrow.   Objective:   Sclerae unicteric  Comfortably laying in bed  Mild leg edema   CBG (last 3)  Recent Labs    05/21/20 1643 05/21/20 2102 05/22/20 0605  GLUCAP 148* 151* 99     Labs:   Urine Studies No results for input(s): UHGB, CRYS in the last 72 hours.  Invalid input(s): UACOL, UAPR, USPG, UPH, UTP, UGL, UKET, UBIL, UNIT, UROB, ULEU, UEPI, UWBC, URBC, UBAC, CAST, [place], [place]  Basic Metabolic Panel: Recent Labs  Lab 05/20/20 0133 05/20/20 0133 05/20/20 0227 05/21/20 0502 05/22/20 0526  NA 139  --   --  139 138  K 3.9   < >  --  4.0 3.8  CL 109  --   --  107 104  CO2 21*  --   --  23 24  GLUCOSE 156*  --   --  110* 107*  BUN 19  --   --  18 24*  CREATININE 1.63*  --   --  1.50* 1.70*  CALCIUM 9.1  --   --  8.8* 9.0  MG  --   --  1.7  --   --    < > = values in this interval not displayed.   GFR Estimated Creatinine Clearance: 44.4 mL/min (A) (by C-G formula based on SCr of 1.7 mg/dL (H)). Liver Function Tests: Recent Labs  Lab 05/20/20 0133  AST 23  ALT 17  ALKPHOS 42  BILITOT 0.8  PROT 6.2*  ALBUMIN 3.6   No results for input(s): LIPASE, AMYLASE in the last 168 hours. No results for input(s): AMMONIA in the last 168 hours. Coagulation profile No results for input(s): INR, PROTIME in the last 168 hours.  CBC: Recent Labs  Lab 05/20/20 0133 05/21/20 0502 05/22/20 0615  WBC 6.8 4.9 6.9  NEUTROABS 5.1  --   --   HGB 9.5* 7.9* 8.5*  HCT 29.4* 24.4* 25.8*  MCV 112.6* 110.9* 110.7*  PLT 86* 93* 140*   Cardiac Enzymes: No results for input(s): CKTOTAL, CKMB, CKMBINDEX, TROPONINI in the last 168 hours. BNP: Invalid input(s): POCBNP CBG: Recent Labs   Lab 05/21/20 0622 05/21/20 1100 05/21/20 1643 05/21/20 2102 05/22/20 0605  GLUCAP 102* 108* 148* 151* 99   D-Dimer Recent Labs    05/20/20 0804  DDIMER 1.60*   Hgb A1c No results for input(s): HGBA1C in the last 72 hours. Lipid Profile No results for input(s): CHOL, HDL, LDLCALC, TRIG, CHOLHDL, LDLDIRECT in the last 72 hours. Thyroid function studies Recent Labs    05/20/20 0804  TSH 2.131   Anemia work up No results for input(s): VITAMINB12, FOLATE, FERRITIN, TIBC, IRON, RETICCTPCT in the last 72 hours. Microbiology Recent Results (from the past 240 hour(s))  SARS CORONAVIRUS 2 (TAT 6-24 HRS) Nasopharyngeal Nasopharyngeal Swab     Status: None   Collection Time: 05/15/20 11:00 AM   Specimen: Nasopharyngeal Swab  Result Value Ref Range Status   SARS Coronavirus 2 NEGATIVE NEGATIVE Final    Comment: (NOTE) SARS-CoV-2 target nucleic acids are NOT DETECTED.  The SARS-CoV-2 RNA is generally detectable in upper and lower respiratory specimens during the acute phase of infection. Negative results do not preclude SARS-CoV-2 infection, do not rule  out co-infections with other pathogens, and should not be used as the sole basis for treatment or other patient management decisions. Negative results must be combined with clinical observations, patient history, and epidemiological information. The expected result is Negative.  Fact Sheet for Patients: SugarRoll.be  Fact Sheet for Healthcare Providers: https://www.woods-mathews.com/  This test is not yet approved or cleared by the [place] FDA and  has been authorized for detection and/or diagnosis of SARS-CoV-2 by FDA under an Emergency Use Authorization (EUA). This EUA will remain  in effect (meaning this test can be used) for the duration of the COVID-19 declaration under Se ction 564(b)(1) of the Act, 21 U.S.C. section 360bbb-3(b)(1), unless the authorization is terminated  or revoked sooner.  Performed at [facility], [place] [address] [place], [place] [address]   SARS Coronavirus 2 by RT PCR (hospital order, performed in [facility] hospital lab) Nasopharyngeal Nasopharyngeal Swab     Status: None   Collection Time: 05/20/20  1:56 AM   Specimen: Nasopharyngeal Swab  Result Value Ref Range Status   SARS Coronavirus 2 NEGATIVE NEGATIVE Final    Comment: (NOTE) SARS-CoV-2 target nucleic acids are NOT DETECTED.  The SARS-CoV-2 RNA is generally detectable in upper and lower respiratory specimens during the acute phase of infection. The lowest concentration of SARS-CoV-2 viral copies this assay can detect is 250 copies / mL. A negative result does not preclude SARS-CoV-2 infection and should not be used as the sole basis for treatment or other patient management decisions.  A negative result may occur with improper specimen collection / handling, submission of specimen other than nasopharyngeal swab, presence of viral mutation(s) within the areas targeted by this assay, and inadequate number of viral copies (<250 copies / mL). A negative result must be combined with clinical observations, patient history, and epidemiological information.  Fact Sheet for Patients:   StrictlyIdeas.no  Fact Sheet for Healthcare Providers: BankingDealers.co.za  This test is not yet approved or  cleared by the [place] FDA and has been authorized for detection and/or diagnosis of SARS-CoV-2 by FDA under an Emergency Use Authorization (EUA).  This EUA will remain in effect (meaning this test can be used) for the duration of the COVID-19 declaration under Section 564(b)(1) of the Act, 21 U.S.C. section 360bbb-3(b)(1), unless the authorization is terminated or revoked sooner.  Performed at [facility], [place] [address], [place], [place] [address]       Studies:  ECHOCARDIOGRAM LIMITED  Result Date:  05/20/2020    ECHOCARDIOGRAM LIMITED REPORT   Patient Name:   JAYDEEN ODOR Date of Exam: 05/20/2020 Medical Rec #:  [id number]         Height:       69.0 in Accession #:    [id number]        Weight:       222.0 lb Date of Birth:  [birth year]          BSA:          2.160 m Patient Age:    74 years          BP:           145/83 mmHg Patient Gender: M                 HR:           73 bpm. Exam Location:  Inpatient Procedure: Limited Echo, Cardiac Doppler, Color Doppler and Intracardiac  Opacification Agent Indications:    CHF-Acute diastolic  History:        Patient has prior history of Echocardiogram examinations, most                 recent 04/13/2020. CHF, Arrythmias:Atrial Fibrillation; Risk                 Factors:Hypertension and Dyslipidemia.  Sonographer:    Clayton Lefort RDCS (AE) Referring Phys: 1993 [facility] G BARRETT  Sonographer Comments: Suboptimal apical window and patient is morbidly obese. IMPRESSIONS  1. Left ventricular ejection fraction, by estimation, is 55 to 60%. The left ventricle has normal function. The left ventricle has no regional wall motion abnormalities. There is moderate left ventricular hypertrophy. Left ventricular diastolic parameters are indeterminate.  2. Right ventricular systolic function is normal. The right ventricular size is normal. There is moderately elevated pulmonary artery systolic pressure. The estimated right ventricular systolic pressure is 69.4 mmHg.  3. The mitral valve is grossly normal. Mild mitral valve regurgitation.  4. The aortic valve is tricuspid. Aortic valve regurgitation is mild. Mild to moderate aortic valve sclerosis/calcification is present, without any evidence of aortic stenosis.  5. Aortic dilatation noted. 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  6. The inferior vena cava is normal in size with <50% respiratory variability, suggesting right atrial pressure of 8 mmHg. Comparison(s): A prior study was performed on 04/13/20. No  significant change from prior study. Prior images reviewed side by side. FINDINGS  Left Ventricle: Left ventricular ejection fraction, by estimation, is 55 to 60%. The left ventricle has normal function. The left ventricle has no regional wall motion abnormalities. Definity contrast agent was given IV to delineate the left ventricular  endocardial borders. There is moderate left ventricular hypertrophy. Right Ventricle: The right ventricular size is normal. Right ventricular systolic function is normal. There is moderately elevated pulmonary artery systolic pressure. The tricuspid regurgitant velocity is 3.16 m/s, and with an assumed right atrial pressure of 10 mmHg, the estimated right ventricular systolic pressure is 85.4 mmHg. Left Atrium: Left atrial size was not well visualized. Right Atrium: Right atrial size was not well visualized. Pericardium: There is no evidence of pericardial effusion. Mitral Valve: The mitral valve is grossly normal. Mild mitral valve regurgitation. Tricuspid Valve: The tricuspid valve is not well visualized. Tricuspid valve regurgitation is mild. Aortic Valve: The aortic valve is tricuspid. Aortic valve regurgitation is mild. Mild to moderate aortic valve sclerosis/calcification is present, without any evidence of aortic stenosis. There is moderate calcification of the aortic valve. Pulmonic Valve: The pulmonic valve was not well visualized. Pulmonic valve regurgitation is trivial. Aorta: Aortic dilatation noted. 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 Venous: The inferior vena cava is normal in size with less than 50% respiratory variability, suggesting right atrial pressure of 8 mmHg. IAS/Shunts: The interatrial septum was not assessed.  LEFT VENTRICLE PLAX 2D LVIDd:         4.40 cm LVIDs:         2.70 cm LV PW:         1.50 cm LV IVS:        1.50 cm LVOT diam:     2.00 cm LVOT Area:     3.14 cm  IVC IVC diam: 2.00 cm LEFT ATRIUM         Index LA diam:    4.90  cm 2.27 cm/m   AORTA Ao Root diam: 4.10  cm Ao Asc diam:  3.80 cm TRICUSPID VALVE TR Peak grad:   39.9 mmHg TR Vmax:        316.00 cm/s  SHUNTS Systemic Diam: 2.00 cm Cherlynn Kaiser MD Electronically signed by Cherlynn Kaiser MD Signature Date/Time: 05/20/2020/5:34:43 PM    Final     Assessment: 74 y.o. male   1.  Acute diastolic congestive heart failure, proved 2. Left foot gout  3. AF 4. DM 5.  Intrahepatic cholangiocarcinoma, on chemotherapy 6.  Anemia and thrombocytopenia from cancer and chemo     Plan:  -CHF management per cardiology and primary team -I have recently changed his chemo to single agent, to reduce the IV fluids along with chemo, will hold chemo this week to allow patient to recover -I will cancel his appointment next Tuesday and rescheduled to a week later.   [name], MD 05/21/2020

## 2020-05-22 NOTE — Progress Notes (Signed)
Went over discharge instructions, change and add-ons of medications with the patient. Patient verbalizes understanding and has no questions for medical staff. Tele monitor has been removed and CCMD has been notified about patient's discharged. Patient's port-a-cath has been deaccessed by [facility] team. Patient's transportation has been called, waiting for a change of clothes to be brought by his wife.

## 2020-05-23 NOTE — Progress Notes (Deleted)
[facility]   [phone] [phone]   Clinic Follow up Note   Patient Care Team: [name], PA as PCP - General (Family Medicine) Lorretta Harp, MD as PCP - Cardiology (Cardiology) [name], MD as Consulting Physician (General Surgery) [name], [name], MD as Consulting Physician (Gastroenterology) [name], MD as Consulting Physician (Hematology) Lorretta Harp, MD as Consulting Physician (Cardiology) 05/23/2020  CHIEF COMPLAINT:   SUMMARY OF ONCOLOGIC HISTORY: Oncology History Overview Note  Cancer Staging Intrahepatic cholangiocarcinoma ([place]) Staging form: Intrahepatic Bile Duct, AJCC 8th Edition - Clinical stage from 08/19/2019: Stage II (cT2, cN0, cM0) - Signed by [name], MD on 08/19/2019    Intrahepatic cholangiocarcinoma ([place])  06/28/2019 Imaging   CT AP W Contrast 06/28/19  IMPRESSION: 1. Heterogeneous hypodensity posteriorly in the right hepatic lobe and potentially extending into the caudate lobe suspicious for a mass. There is felt to be truncation of branches of the portal vein in this vicinity and some narrowing of the hepatic vein, as well as triangular-shaped regions of abnormal hypoenhancement posteriorly in the right hepatic lobe likely representing downstream vascular effects. Cannot exclude malignancy such as cholangiocarcinoma or hepatocellular carcinoma, and follow up hepatic protocol MRI with and without contrast is recommended to further characterize. 2. 4 mm right middle lobe pulmonary nodule is likely benign but may merit surveillance. 3. Cholelithiasis. 4.  Aortic Atherosclerosis (ICD10-I70.0). 5. Prostatomegaly. 6. Mild impingement at L3-4 and L4-5.   07/31/2019 Imaging   MRI Liver 07/31/19 IMPRESSION: 1. 7.3 cm in long axis mass in the right hepatic lobe spanning into the caudate lobe, high suspicion for malignancy such as hepatocellular carcinoma or cholangiocarcinoma. Suspected  effacement or occlusion of the right hepatic vein and posterior branches of the right portal vein. Two smaller tumor nodules along the posterior periphery of the dominant mass. Tissue diagnosis is recommended. 2. No findings of pathologic adenopathy or distant metastatic spread. 3. 9 mm gallstone in the gallbladder. There is mild gallbladder wall thickening which may be from nondistention, correlate clinically in assessing for cholecystitis. 4.  Aortic Atherosclerosis (ICD10-I70.0). 5. Mild diffuse hepatic steatosis.   08/11/2019 Initial Biopsy   DIAGNOSIS: 08/11/19  A. LIVER, RIGHT, BIOPSY:  - Adenocarcinoma.   08/18/2019 Imaging   CT Chest 08/18/19  IMPRESSION: 1. Multiple pulmonary nodules largest at approximately 7 mm in the right lower lobe, nonspecific but concerning given findings in the liver. 2. No signs of definitive metastatic disease, also with mildly enlarged upper abdominal lymph nodes as discussed.   Aortic Atherosclerosis (ICD10-I70.0).   08/19/2019 Initial Diagnosis   Intrahepatic cholangiocarcinoma ([place])   08/19/2019 Cancer Staging   Staging form: Intrahepatic Bile Duct, AJCC 8th Edition - Clinical stage from 08/19/2019: Stage II (cT2, cN0, cM0) - Signed by [name], MD on 08/19/2019   09/29/2019 -  Chemotherapy   Cisplatin and Gemcitabine 2 weeks on/1 week off starting 09/29/19. Cisplatin held from cycle 9 (03/23/20) due to fluid status/Afib. He is now on maintenance Gemcitabine.     10/09/2019 Imaging   CT AP IMPRESSION: 1. The dominant right hepatic lobe mass is minimally reduced in size compared to prior exams, currently measuring 6.2 by 5.3 cm, previously 6.2 by 5.6 cm. However, there is a new small hypodense lesion centrally in the right hepatic lobe which is suspicious for a new small focus of tumor. Accordingly this is an overall mixed appearance. 2. Continued hypoenhancement in the liver downstream of the tumor likely attributable to narrowing or occlusion  of  the right hepatic vein by the tumor. By virtue of its location the tumor wraps around the intrahepatic portion of the IVC. 3. 4 mm right middle lobe pulmonary nodule, stable compared to earliest available comparison of 07/18/2019. Surveillance of the patient's pulmonary nodules is recommended. 4. Other imaging findings of potential clinical significance: Coronary atherosclerosis. Cholelithiasis. Prominent stool throughout the colon favors constipation. Moderate prostatomegaly with heterogeneous enhancement of the prostate gland. Lumbar spondylosis and degenerative disc disease causing mild bilateral foraminal impingement at L3-4 and L4-5.   Aortic Atherosclerosis (ICD10-I70.0).   12/24/2019 Imaging   CT CAP W Contrast  IMPRESSION: 1. Right hepatic lobe mass and adjacent right hepatic lobe nodules appear grossly stable. No evidence of distant metastatic disease. 2. Continued stability of small pulmonary nodules. Recommend attention on follow-up. 3. Cholelithiasis. 4. Enlarged prostate. 5. Aortic atherosclerosis (ICD10-I70.0). Coronary artery calcification.   04/08/2020 Imaging   CT CAP  IMPRESSION: 1. Stable or minimally decreased size of a very ill-defined, hypodense and somewhat retractile appearing mass of the central right lobe of the liver abutting the inferior vena cava and right portal vein, measuring approximately 5.7 x 5.0 cm, previously 6.2 x 5.2 cm when measured similarly. Findings are consistent with stable or minimally improved cholangiocarcinoma. 2. Small hypodense nodules of the right lobe identified on prior examination are poorly appreciated on this single phase contrast examination although not grossly changed. Attention on follow-up. 3. There are new, moderate bilateral pleural effusions and associated atelectasis or consolidation as well as a new small pericardial effusion, nonspecific although generally concerning and suspicious for malignant effusions. There is no  directly visualized pleural nodularity. 4. Multiple small pulmonary nodules are stable.  No new nodules. 5. Coronary artery disease. Aortic Atherosclerosis (ICD10-I70.0).     CURRENT THERAPY:   INTERVAL HISTORY:   REVIEW OF SYSTEMS:   Constitutional: Denies fevers, chills or abnormal weight loss Eyes: Denies blurriness of vision Ears, nose, mouth, throat, and face: Denies mucositis or sore throat Respiratory: Denies cough, dyspnea or wheezes Cardiovascular: Denies palpitation, chest discomfort or lower extremity swelling Gastrointestinal:  Denies nausea, heartburn or change in bowel habits Skin: Denies abnormal skin rashes Lymphatics: Denies new lymphadenopathy or easy bruising Neurological:Denies numbness, tingling or new weaknesses Behavioral/Psych: Mood is stable, no new changes  All other systems were reviewed with the patient and are negative.  MEDICAL HISTORY:  Past Medical History:  Diagnosis Date  . Arthritis   . Diabetes ([place])   . GERD (gastroesophageal reflux disease)   . Hyperlipidemia   . Hypertension   . Intrahepatic cholangiocarcinoma ([place])     SURGICAL HISTORY: Past Surgical History:  Procedure Laterality Date  . APPENDECTOMY  1980  . CARDIOVERSION N/A 04/27/2020   Procedure: CARDIOVERSION;  Surgeon: Dorothy Spark, MD;  Location: [facility] ENDOSCOPY;  Service: Cardiovascular;  Laterality: N/A;  . CARDIOVERSION N/A 05/19/2020   Procedure: CARDIOVERSION;  Surgeon: Elouise Munroe, MD;  Location: [facility] ENDOSCOPY;  Service: Cardiovascular;  Laterality: N/A;  . COLONOSCOPY    . IR IMAGING GUIDED PORT INSERTION  02/23/2020    I have reviewed the social history and family history with the patient and they are unchanged from previous note.  ALLERGIES:  has No Known Allergies.  MEDICATIONS:  Current Outpatient Medications  Medication Sig Dispense Refill  . allopurinol (ZYLOPRIM) 100 MG tablet Take 100 mg by mouth daily.    Marland Kitchen apixaban (ELIQUIS) 5 MG TABS tablet  Take 1 tablet (5 mg total) by mouth 2 (two) times daily.  180 tablet 3  . colchicine 0.6 MG tablet Take 1 tablet (0.6 mg total) by mouth daily as needed (gout flare). Take daily for 3 days, then as needed for gout flare 30 tablet 0  . diltiazem (CARDIZEM CD) 180 MG 24 hr capsule Take 1 capsule (180 mg total) by mouth daily. 90 capsule 3  . fenofibrate (TRICOR) 145 MG tablet Take 145 mg by mouth daily.    . finasteride (PROSCAR) 5 MG tablet Take 5 mg by mouth daily.    . furosemide (LASIX) 40 MG tablet Take 1 tablet (40 mg total) by mouth daily. 30 tablet 3  . lidocaine-prilocaine (EMLA) cream Apply 1 application topically as needed. 30 g 3  . magnesium oxide (MAG-OX) 400 MG tablet TAKE 1 TABLET BY MOUTH EVERY DAY (Patient taking differently: Take 400 mg by mouth daily. ) 30 tablet 0  . metFORMIN (GLUCOPHAGE) 500 MG tablet Take 500 mg by mouth 2 (two) times daily with a meal.     . ondansetron (ZOFRAN) 8 MG tablet Take 1 tablet (8 mg total) by mouth 2 (two) times daily as needed. Start on the third day after chemotherapy. 30 tablet 1  . pantoprazole (PROTONIX) 40 MG tablet Take 1 tablet (40 mg total) by mouth 2 (two) times daily. 180 tablet 0  . pravastatin (PRAVACHOL) 40 MG tablet Take 40 mg by mouth daily.    . predniSONE (DELTASONE) 20 MG tablet Take 2 tablets (40 mg total) by mouth daily with breakfast for 3 days. 6 tablet 0  . sucralfate (CARAFATE) 1 g tablet TAKE 1 TABLET BY MOUTH EVERY 6 HOURS AS NEEDED. (Patient taking differently: Take 1 g by mouth every 6 (six) hours as needed. ) 180 tablet 0  . tamsulosin (FLOMAX) 0.4 MG CAPS capsule Take 0.4 mg by mouth daily.    . Vitamin D, Ergocalciferol, (DRISDOL) 1.25 MG (50000 UT) CAPS capsule Take 50,000 Units by mouth every 7 (seven) days.     No current facility-administered medications for this visit.    PHYSICAL EXAMINATION: ECOG PERFORMANCE STATUS: {CHL ONC ECOG PS:[id number]}  There were no vitals filed for this visit. There were no  vitals filed for this visit.  GENERAL:alert, no distress and comfortable SKIN: skin color, texture, turgor are normal, no rashes or significant lesions EYES: normal, Conjunctiva are pink and non-injected, sclera clear OROPHARYNX:no exudate, no erythema and lips, buccal mucosa, and tongue normal  NECK: supple, thyroid normal size, non-tender, without nodularity LYMPH:  no palpable lymphadenopathy in the cervical, axillary or inguinal LUNGS: clear to auscultation and percussion with normal breathing effort HEART: regular rate & rhythm and no murmurs and no lower extremity edema ABDOMEN:abdomen soft, non-tender and normal bowel sounds Musculoskeletal:no cyanosis of digits and no clubbing  NEURO: alert & oriented x 3 with fluent speech, no focal motor/sensory deficits  LABORATORY DATA:  I have reviewed the data as listed CBC Latest Ref Rng & Units 05/22/2020 05/21/2020 05/20/2020  WBC 4.0 - 10.5 K/uL 6.9 4.9 6.8  Hemoglobin 13.0 - 17.0 g/dL 8.5(L) 7.9(L) 9.5(L)  Hematocrit 39 - 52 % 25.8(L) 24.4(L) 29.4(L)  Platelets 150 - 400 K/uL 140(L) 93(L) 86(L)     CMP Latest Ref Rng & Units 05/22/2020 05/21/2020 05/20/2020  Glucose 70 - 99 mg/dL 107(H) 110(H) 156(H)  BUN 8 - 23 mg/dL 24(H) 18 19  Creatinine 0.61 - 1.24 mg/dL 1.70(H) 1.50(H) 1.63(H)  Sodium 135 - 145 mmol/L 138 139 139  Potassium 3.5 - 5.1 mmol/L 3.8 4.0 3.9  Chloride 98 - 111 mmol/L 104 107 109  CO2 22 - 32 mmol/L 24 23 21(L)  Calcium 8.9 - 10.3 mg/dL 9.0 8.8(L) 9.1  Total Protein 6.5 - 8.1 g/dL - - 6.2(L)  Total Bilirubin 0.3 - 1.2 mg/dL - - 0.8  Alkaline Phos 38 - 126 U/L - - 42  AST 15 - 41 U/L - - 23  ALT 0 - 44 U/L - - 17      RADIOGRAPHIC STUDIES: I have personally reviewed the radiological images as listed and agreed with the findings in the report. No results found.   ASSESSMENT & PLAN:  No problem-specific Assessment & Plan notes found for this encounter.   No orders of the defined types were placed in this  encounter.  All questions were answered. The patient knows to call the clinic with any problems, questions or concerns. No barriers to learning was detected. I spent {CHL ONC TIME VISIT - RCBUL:[id number]} counseling the patient face to face. The total time spent in the appointment was {CHL ONC TIME VISIT - OZYYQ:[id number]} and more than 50% was on counseling and review of test results     Alla Feeling, NP 05/23/20

## 2020-05-25 ENCOUNTER — Inpatient Hospital Stay: Payer: Medicare HMO

## 2020-05-25 ENCOUNTER — Inpatient Hospital Stay: Payer: Medicare HMO | Admitting: Nurse Practitioner

## 2020-05-25 ENCOUNTER — Telehealth: Payer: Self-pay | Admitting: Cardiovascular Disease

## 2020-05-25 ENCOUNTER — Other Ambulatory Visit: Payer: Self-pay | Admitting: Nurse Practitioner

## 2020-05-25 ENCOUNTER — Inpatient Hospital Stay: Payer: Medicare HMO | Admitting: *Deleted

## 2020-05-25 LAB — PATHOLOGIST SMEAR REVIEW

## 2020-05-25 NOTE — Telephone Encounter (Signed)
Left a message for the patient to call back.  

## 2020-05-25 NOTE — Telephone Encounter (Signed)
Patients wife calling to schedule hospital f/u for this week. Next in office appt is 06/22/20 with Kerin Ransom. He does have some virtual appts this week, will that be okay for hospital f/u? Please advise.

## 2020-05-25 NOTE — Telephone Encounter (Signed)
Left message for wife, labs from hospital will be fine.

## 2020-05-25 NOTE — Telephone Encounter (Signed)
I attempted to contact patient 05/25/20 to schedule TOC appt with Dr.Berry or APP following patients discharge.

## 2020-05-26 ENCOUNTER — Telehealth: Payer: Self-pay | Admitting: Hematology

## 2020-05-26 ENCOUNTER — Telehealth: Payer: Self-pay | Admitting: Cardiovascular Disease

## 2020-05-26 NOTE — Telephone Encounter (Signed)
Please advise  Last visit with you in June 2021- you wanted to stop Amlodipine, and Cardizem.  Patient was seen in ED July 2021- and they told him to continue Cardizem, stop Metoprolol and stop Lisinopril.  Which medications would you like for him to be on??   Currently they are confused on which ones he should be taking.Marland Kitchen

## 2020-05-26 NOTE — Telephone Encounter (Signed)
Scheduled appt per 7/3 schmsg - unable to reach pt , left message with new apt date and time

## 2020-05-26 NOTE — Telephone Encounter (Signed)
Pt c/o medication issue:  1. Name of Medication:  lisinopril (ZESTRIL) 40 MG tablet  metoprolol succinate (TOPROL-XL) 24 hr tablet 25 mg   2. How are you currently taking this medication (dosage and times per day)? Pt is not taking these medications  3. Are you having a reaction (difficulty breathing--STAT)? no  4. What is your medication issue? Wife of the patient called and wanted to know why the patient was taken off of these medications and what the patient is supposed to be on in place of these .  The wife of the patient was concerned because the patient was due to see Dr, Gwenlyn Found or an APP within a week from his hospital D/C but there were no openings until 8/04 with Kerin Ransom

## 2020-05-26 NOTE — Progress Notes (Signed)
[place]   [phone] [phone]   Clinic Follow up Note   Patient Care Team: [name], PA as PCP - General (Family Medicine) Lorretta Harp, MD as PCP - Cardiology (Cardiology) [name], MD as Consulting Physician (General Surgery) [name], [name], MD as Consulting Physician (Gastroenterology) [name], MD as Consulting Physician (Hematology) Lorretta Harp, MD as Consulting Physician (Cardiology)  Date of Service:  05/31/2020  CHIEF COMPLAINT:  F/u cholangiocarcinoma  SUMMARY OF ONCOLOGIC HISTORY: Oncology History Overview Note  Cancer Staging Intrahepatic cholangiocarcinoma ([place]) Staging form: Intrahepatic Bile Duct, AJCC 8th Edition - Clinical stage from 08/19/2019: Stage II (cT2, cN0, cM0) - Signed by [name], MD on 08/19/2019    Intrahepatic cholangiocarcinoma ([place])  06/28/2019 Imaging   CT AP W Contrast 06/28/19  IMPRESSION: 1. Heterogeneous hypodensity posteriorly in the right hepatic lobe and potentially extending into the caudate lobe suspicious for a mass. There is felt to be truncation of branches of the portal vein in this vicinity and some narrowing of the hepatic vein, as well as triangular-shaped regions of abnormal hypoenhancement posteriorly in the right hepatic lobe likely representing downstream vascular effects. Cannot exclude malignancy such as cholangiocarcinoma or hepatocellular carcinoma, and follow up hepatic protocol MRI with and without contrast is recommended to further characterize. 2. 4 mm right middle lobe pulmonary nodule is likely benign but may merit surveillance. 3. Cholelithiasis. 4.  Aortic Atherosclerosis (ICD10-I70.0). 5. Prostatomegaly. 6. Mild impingement at L3-4 and L4-5.   07/31/2019 Imaging   MRI Liver 07/31/19 IMPRESSION: 1. 7.3 cm in long axis mass in the right hepatic lobe spanning into the caudate lobe, high suspicion for malignancy such as hepatocellular  carcinoma or cholangiocarcinoma. Suspected effacement or occlusion of the right hepatic vein and posterior branches of the right portal vein. Two smaller tumor nodules along the posterior periphery of the dominant mass. Tissue diagnosis is recommended. 2. No findings of pathologic adenopathy or distant metastatic spread. 3. 9 mm gallstone in the gallbladder. There is mild gallbladder wall thickening which may be from nondistention, correlate clinically in assessing for cholecystitis. 4.  Aortic Atherosclerosis (ICD10-I70.0). 5. Mild diffuse hepatic steatosis.   08/11/2019 Initial Biopsy   DIAGNOSIS: 08/11/19  A. LIVER, RIGHT, BIOPSY:  - Adenocarcinoma.   08/18/2019 Imaging   CT Chest 08/18/19  IMPRESSION: 1. Multiple pulmonary nodules largest at approximately 7 mm in the right lower lobe, nonspecific but concerning given findings in the liver. 2. No signs of definitive metastatic disease, also with mildly enlarged upper abdominal lymph nodes as discussed.   Aortic Atherosclerosis (ICD10-I70.0).   08/19/2019 Initial Diagnosis   Intrahepatic cholangiocarcinoma ([place])   08/19/2019 Cancer Staging   Staging form: Intrahepatic Bile Duct, AJCC 8th Edition - Clinical stage from 08/19/2019: Stage II (cT2, cN0, cM0) - Signed by [name], MD on 08/19/2019   09/29/2019 -  Chemotherapy   Cisplatin and Gemcitabine 2 weeks on/1 week off starting 09/29/19. Cisplatin held from cycle 9 (03/23/20) due to fluid status/Afib. He is now on maintenance Gemcitabine.     10/09/2019 Imaging   CT AP IMPRESSION: 1. The dominant right hepatic lobe mass is minimally reduced in size compared to prior exams, currently measuring 6.2 by 5.3 cm, previously 6.2 by 5.6 cm. However, there is a new small hypodense lesion centrally in the right hepatic lobe which is suspicious for a new small focus of tumor. Accordingly this is an overall mixed appearance. 2. Continued hypoenhancement in the liver downstream of the  tumor  likely attributable to narrowing or occlusion of the right hepatic vein by the tumor. By virtue of its location the tumor wraps around the intrahepatic portion of the IVC. 3. 4 mm right middle lobe pulmonary nodule, stable compared to earliest available comparison of 07/18/2019. Surveillance of the patient's pulmonary nodules is recommended. 4. Other imaging findings of potential clinical significance: Coronary atherosclerosis. Cholelithiasis. Prominent stool throughout the colon favors constipation. Moderate prostatomegaly with heterogeneous enhancement of the prostate gland. Lumbar spondylosis and degenerative disc disease causing mild bilateral foraminal impingement at L3-4 and L4-5.   Aortic Atherosclerosis (ICD10-I70.0).   12/24/2019 Imaging   CT CAP W Contrast  IMPRESSION: 1. Right hepatic lobe mass and adjacent right hepatic lobe nodules appear grossly stable. No evidence of distant metastatic disease. 2. Continued stability of small pulmonary nodules. Recommend attention on follow-up. 3. Cholelithiasis. 4. Enlarged prostate. 5. Aortic atherosclerosis (ICD10-I70.0). Coronary artery calcification.   02/23/2020 Procedure   He had PAC placed on 02/23/20.    04/08/2020 Imaging   CT CAP  IMPRESSION: 1. Stable or minimally decreased size of a very ill-defined, hypodense and somewhat retractile appearing mass of the central right lobe of the liver abutting the inferior vena cava and right portal vein, measuring approximately 5.7 x 5.0 cm, previously 6.2 x 5.2 cm when measured similarly. Findings are consistent with stable or minimally improved cholangiocarcinoma. 2. Small hypodense nodules of the right lobe identified on prior examination are poorly appreciated on this single phase contrast examination although not grossly changed. Attention on follow-up. 3. There are new, moderate bilateral pleural effusions and associated atelectasis or consolidation as well as a new small pericardial  effusion, nonspecific although generally concerning and suspicious for malignant effusions. There is no directly visualized pleural nodularity. 4. Multiple small pulmonary nodules are stable.  No new nodules. 5. Coronary artery disease. Aortic Atherosclerosis (ICD10-I70.0).      CURRENT THERAPY:  Cisplatin and Gemcitabine 2 weeks on/1 week off starting 09/29/19.Cisplatin held from cycle 9(03/23/20)due to fluid status/Afib. He is now on maintenance Gemcitabine.  INTERVAL HISTORY:  Brandon Sherman is here for a follow up. He presents to the clinic alone. He was hospitalized in 04/2020 for acute congestive heart failure and underwent another cardioversion. He notes he is doing well and overall better. He notes since discharge he has not had breathing issues or heart palpitations. He has not followed up with his cardiologist as outpatient yet. He notes his LE edema has resolved. I reviewed his medication list with him. He is on Magnesium once a day. He wonders if he can stop. Will check with lab next week.     REVIEW OF SYSTEMS:   Constitutional: Denies fevers, chills or abnormal weight loss Eyes: Denies blurriness of vision Ears, nose, mouth, throat, and face: Denies mucositis or sore throat Respiratory: Denies cough, dyspnea or wheezes Cardiovascular: Denies palpitation, chest discomfort or lower extremity swelling Gastrointestinal:  Denies nausea, heartburn or change in bowel habits Skin: Denies abnormal skin rashes Lymphatics: Denies new lymphadenopathy or easy bruising Neurological:Denies numbness, tingling or new weaknesses Behavioral/Psych: Mood is stable, no new changes  All other systems were reviewed with the patient and are negative.  MEDICAL HISTORY:  Past Medical History:  Diagnosis Date  . Arthritis   . Diabetes ([place])   . GERD (gastroesophageal reflux disease)   . Hyperlipidemia   . Hypertension   . Intrahepatic cholangiocarcinoma ([place])     SURGICAL HISTORY: Past  Surgical History:  Procedure Laterality Date  .  APPENDECTOMY  1980  . CARDIOVERSION N/A 04/27/2020   Procedure: CARDIOVERSION;  Surgeon: Dorothy Spark, MD;  Location: [facility] ENDOSCOPY;  Service: Cardiovascular;  Laterality: N/A;  . CARDIOVERSION N/A 05/19/2020   Procedure: CARDIOVERSION;  Surgeon: Elouise Munroe, MD;  Location: [facility] ENDOSCOPY;  Service: Cardiovascular;  Laterality: N/A;  . COLONOSCOPY    . IR IMAGING GUIDED PORT INSERTION  02/23/2020    I have reviewed the social history and family history with the patient and they are unchanged from previous note.  ALLERGIES:  has No Known Allergies.  MEDICATIONS:  Current Outpatient Medications  Medication Sig Dispense Refill  . allopurinol (ZYLOPRIM) 100 MG tablet Take 100 mg by mouth daily.    Marland Kitchen apixaban (ELIQUIS) 5 MG TABS tablet Take 1 tablet (5 mg total) by mouth 2 (two) times daily. 180 tablet 3  . colchicine 0.6 MG tablet Take 1 tablet (0.6 mg total) by mouth daily as needed (gout flare). Take daily for 3 days, then as needed for gout flare 30 tablet 0  . diltiazem (CARDIZEM CD) 180 MG 24 hr capsule Take 1 capsule (180 mg total) by mouth daily. 90 capsule 3  . fenofibrate (TRICOR) 145 MG tablet Take 145 mg by mouth daily.    . finasteride (PROSCAR) 5 MG tablet Take 5 mg by mouth daily.    . furosemide (LASIX) 40 MG tablet Take 1 tablet (40 mg total) by mouth daily. 30 tablet 3  . lidocaine-prilocaine (EMLA) cream Apply 1 application topically as needed. 30 g 3  . magnesium oxide (MAG-OX) 400 MG tablet Take 1 tablet (400 mg total) by mouth daily. 30 tablet 0  . metFORMIN (GLUCOPHAGE) 500 MG tablet Take 500 mg by mouth 2 (two) times daily with a meal.     . ondansetron (ZOFRAN) 8 MG tablet Take 1 tablet (8 mg total) by mouth 2 (two) times daily as needed. Start on the third day after chemotherapy. 30 tablet 1  . pantoprazole (PROTONIX) 40 MG tablet Take 1 tablet (40 mg total) by mouth 2 (two) times daily. 180 tablet 0  .  pravastatin (PRAVACHOL) 40 MG tablet Take 40 mg by mouth daily.    . sucralfate (CARAFATE) 1 g tablet TAKE 1 TABLET BY MOUTH EVERY 6 HOURS AS NEEDED. 180 tablet 0  . tamsulosin (FLOMAX) 0.4 MG CAPS capsule Take 0.4 mg by mouth daily.    . Vitamin D, Ergocalciferol, (DRISDOL) 1.25 MG (50000 UT) CAPS capsule Take 50,000 Units by mouth every 7 (seven) days.     No current facility-administered medications for this visit.   Facility-Administered Medications Ordered in Other Visits  Medication Dose Route Frequency Provider Last Rate Last Admin  . sodium chloride flush (NS) 0.9 % injection 10 mL  10 mL Intracatheter PRN [name], MD   10 mL at 05/31/20 1055    PHYSICAL EXAMINATION: ECOG PERFORMANCE STATUS: 2 - Symptomatic, <50% confined to bed  Vitals:   05/31/20 0833  BP: (!) 154/88  Pulse: 83  Resp: 16  Temp: 98.1 F (36.7 C)  SpO2: 99%   Filed Weights   05/31/20 0833  Weight: 206 lb 11.2 oz (93.8 kg)    Due to COVID19 we will limit examination to appearance. Patient had no complaints.  GENERAL:alert, no distress and comfortable SKIN: skin color normal, no rashes or significant lesions EYES: normal, Conjunctiva are pink and non-injected, sclera clear  NEURO: alert & oriented x 3 with fluent speech   LABORATORY DATA:  I have reviewed  the data as listed CBC Latest Ref Rng & Units 05/31/2020 05/22/2020 05/21/2020  WBC 4.0 - 10.5 K/uL 5.4 6.9 4.9  Hemoglobin 13.0 - 17.0 g/dL 10.4(L) 8.5(L) 7.9(L)  Hematocrit 39 - 52 % 31.9(L) 25.8(L) 24.4(L)  Platelets 150 - 400 K/uL 268 140(L) 93(L)     CMP Latest Ref Rng & Units 05/31/2020 05/22/2020 05/21/2020  Glucose 70 - 99 mg/dL 102(H) 107(H) 110(H)  BUN 8 - 23 mg/dL 27(H) 24(H) 18  Creatinine 0.61 - 1.24 mg/dL 1.36(H) 1.70(H) 1.50(H)  Sodium 135 - 145 mmol/L 142 138 139  Potassium 3.5 - 5.1 mmol/L 3.9 3.8 4.0  Chloride 98 - 111 mmol/L 107 104 107  CO2 22 - 32 mmol/L 26 24 23   Calcium 8.9 - 10.3 mg/dL 10.2 9.0 8.8(L)  Total Protein 6.5 -  8.1 g/dL 6.4(L) - -  Total Bilirubin 0.3 - 1.2 mg/dL 0.5 - -  Alkaline Phos 38 - 126 U/L 42 - -  AST 15 - 41 U/L 19 - -  ALT 0 - 44 U/L 14 - -      RADIOGRAPHIC STUDIES: I have personally reviewed the radiological images as listed and agreed with the findings in the report. No results found.   ASSESSMENT & PLAN:  CALLAHAN WILD is a 74 y.o. male with    1.Intrahepatic cholangiocarcinoma, cT2N0Mx,unresectable,with indeterminate lung nodules -He was diagnosed in 07/2019.CT scans and MRI liver showa large7.3cm mass in the right hepatic lobe whichabutsportal vein.  -He was seen byour local surgeon Dr. Barry Dienes andDr Carlis Abbott at [facility] concluded that cancer is not resectable due to the invasion to portal vein. -I discussed given his cancer is non-resectable his cancer is likely not curable but still treatable. Istarted him onstandardfirst line chemo with IV Cisplatin and Gemcitabine 2 weeks on/1 weekoff beginning 09/29/19.Cisplatin was held since C9 (03/23/20) due to fluid status/AFib. He is now on maintenance Gemcitabine alone. -His FOresults showed MSI stable disease, IDH 1 mutation,so he may benefit from IDH inhibitor in the future. -He was recently hospitalized in late 04/2020 for acute CHF and underwent another cardioversion. Chemo was held since 05/10/20 for this. He has recovered very well and I discussed restarting chemo today. He is agreeable.  -Given recent heart issues, and stable lung nodules, no evidence of metastatic disease, I discussed target radiation or more invasive Y90 Radio embolization to liver for disease control and give him chemo break until his cancer starts growing again. He notes he is open to this. I will review with in next GI Tumor Board. May stop chemo after C12 if Rad Onc think he is a candidate for radiation  -Labs reviewed, CBC and CMP WNL except anemia. Overall adequate to proceed with C12D1 Gemcitabine today.  -F/u in 3 weeks    2. Nausea,  Low food intakeand weight loss, Epigastric pain -With pain from GERD and nausea he initially lost 30 pounds. -His nausea and GERD is based on what he eats. Controlled onPhenergan, Sucralfate and Pepcid. -Hewaspreviouslyseen by Dietician.  -For his mild constipation he can continue OTC stool softeners. He has Zofran for nausea.  -With single agent Gemcitabine he has no N&V and eating well. Weight has been fluctuating on Lasix.   3. DM, HTN, HLD, Gout, LE edema -On Metformin, amlodipine, lisinopril., allopurinol.Continue to f/u with his PCP and cardiologist.  -He does have LE edema which he attributes to Gout. Ipreviouslyaddedlow dose IV lasix to infusions given crackling of lungs on 3/1/21exam. -After recent cardioversion and acute CHF, his  LE edema has resolved.   4. Recent onset AF, Recent CHF -With PAC placement on 02/23/20 he was seen to have Afib. Based on [place].He is at moderate risk for stroke. -He is now being seen by Cardiologist, on Eliquis, Lasix, Toprolol. -His CT scan from 04/08/20 shows b/l pleural effusion.  -He underwent Cardioversion with Dr. Meda Coffee on 04/27/20. After recent CHF in late 04/2020 he had another cardioversion on 05/19/20. He has recovered well.   5. Nicotine Use -He never smoked but has been chewing Tobacco for the past 60 years.He no longer drinks alcohol. -He now chews 1-2 times a day.Iagainencouraged him toreduce and quit completely.  6.GERDand gastritis, history ofesophageal candidiasis -Hehad repeated EGDwith Dr. Havery Moros in 07/2019. 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  -Will monitor andmayrepeat EGDin future   PLAN: -Labs reviewed and adequate to proceed with C12D1Gemcitabinetoday -Lab, flushandchemo Gemcitabine in 1 and 3weeks  -F/u in 3 weeks  -rad/onc referral and GI conference discussion next week     No problem-specific Assessment & Plan notes found for this encounter.   Orders Placed This Encounter  Procedures  . Ambulatory referral to Radiation Oncology    Referral Priority:   Routine    Referral Type:   Consultation    Referral Reason:   Specialty Services Required    Requested Specialty:   Radiation Oncology    Number of Visits Requested:   1   All questions were answered. The patient knows to call the clinic with any problems, questions or concerns. No barriers to learning was detected. The total time spent in the appointment was 30 minutes.     [name], MD 05/31/2020   I, Joslyn Devon, am acting as scribe for [name], MD.   I have reviewed the above documentation for accuracy and completeness, and I agree with the above.

## 2020-05-27 NOTE — Telephone Encounter (Signed)
Have Brandon Sherman come in to see a Pharm.D. for medication reconciliation

## 2020-05-27 NOTE — Telephone Encounter (Signed)
Patient has follow up with AFIB clinic tomorrow.  07/09

## 2020-05-28 ENCOUNTER — Ambulatory Visit (HOSPITAL_COMMUNITY)
Admission: RE | Admit: 2020-05-28 | Discharge: 2020-05-28 | Disposition: A | Discharge status: Home / Self Care | Payer: Medicare HMO | Source: Ambulatory Visit | Attending: Physician Assistant | Admitting: Physician Assistant

## 2020-05-28 ENCOUNTER — Other Ambulatory Visit: Payer: Self-pay

## 2020-05-28 ENCOUNTER — Encounter (HOSPITAL_COMMUNITY): Payer: Self-pay | Admitting: Physician Assistant

## 2020-05-28 VITALS — BP 170/90 | HR 91 | Ht 69.0 in | Wt 204.8 lb

## 2020-05-28 DIAGNOSIS — I5031 Acute diastolic (congestive) heart failure: Secondary | ICD-10-CM | POA: Diagnosis not present

## 2020-05-28 DIAGNOSIS — M109 Gout, unspecified: Secondary | ICD-10-CM | POA: Diagnosis not present

## 2020-05-28 DIAGNOSIS — Z7901 Long term (current) use of anticoagulants: Secondary | ICD-10-CM | POA: Insufficient documentation

## 2020-05-28 DIAGNOSIS — Z7984 Long term (current) use of oral hypoglycemic drugs: Secondary | ICD-10-CM | POA: Insufficient documentation

## 2020-05-28 DIAGNOSIS — Z79899 Other long term (current) drug therapy: Secondary | ICD-10-CM | POA: Diagnosis not present

## 2020-05-28 DIAGNOSIS — I4819 Other persistent atrial fibrillation: Secondary | ICD-10-CM | POA: Insufficient documentation

## 2020-05-28 DIAGNOSIS — E669 Obesity, unspecified: Secondary | ICD-10-CM | POA: Insufficient documentation

## 2020-05-28 DIAGNOSIS — D6869 Other thrombophilia: Secondary | ICD-10-CM | POA: Diagnosis not present

## 2020-05-28 DIAGNOSIS — I110 Hypertensive heart disease with heart failure: Secondary | ICD-10-CM | POA: Diagnosis not present

## 2020-05-28 DIAGNOSIS — K219 Gastro-esophageal reflux disease without esophagitis: Secondary | ICD-10-CM | POA: Insufficient documentation

## 2020-05-28 DIAGNOSIS — E119 Type 2 diabetes mellitus without complications: Secondary | ICD-10-CM | POA: Insufficient documentation

## 2020-05-28 DIAGNOSIS — M1990 Unspecified osteoarthritis, unspecified site: Secondary | ICD-10-CM | POA: Diagnosis not present

## 2020-05-28 DIAGNOSIS — I2510 Atherosclerotic heart disease of native coronary artery without angina pectoris: Secondary | ICD-10-CM | POA: Diagnosis not present

## 2020-05-28 DIAGNOSIS — Z6830 Body mass index (BMI) 30.0-30.9, adult: Secondary | ICD-10-CM | POA: Diagnosis not present

## 2020-05-28 DIAGNOSIS — Z8505 Personal history of malignant neoplasm of liver: Secondary | ICD-10-CM | POA: Insufficient documentation

## 2020-05-28 DIAGNOSIS — Z8249 Family history of ischemic heart disease and other diseases of the circulatory system: Secondary | ICD-10-CM | POA: Insufficient documentation

## 2020-05-28 DIAGNOSIS — E785 Hyperlipidemia, unspecified: Secondary | ICD-10-CM | POA: Diagnosis not present

## 2020-05-28 NOTE — Progress Notes (Signed)
Primary Care Physician: [name], PA Primary Cardiologist: Dr Gwenlyn Found Primary Electrophysiologist: none Referring Physician: Dr Christie Beckers is a 74 y.o. male with a history of persistent atrial fibrillation, CAD, HTN, HLD, DM, and liver cancer who presents for follow up in the [facility]. The patient was initially diagnosed with atrial fibrillation 02/2020 incidentally while having a Port-A-Cath placed for his chemo. Patient is on Eliquis for a CHADS2VASC score of 4. He underwent DCCV on 04/27/20 but unfortunately reverted back to afib after about one week. Patient did feel less fatigued while in SR. He denies significant snoring or alcohol use.   On follow up today, patient is s/p DCCV on 05/19/20. Unfortunately, he was admitted 05/20/20 with acute diastolic CHF. He converted back to rate controlled afib during his hospitalization. His Multaq was discontinued at that time. He reports that he feels well today even in afib. He denies bleeding issues on anticoagulation. His primary concern today is his gout medications.   Today, he denies symptoms of palpitations, chest pain, shortness of breath, orthopnea, PND, lower extremity edema, dizziness, presyncope, syncope, snoring, daytime somnolence, bleeding, or neurologic sequela. The patient is tolerating medications without difficulties and is otherwise without complaint today.    Atrial Fibrillation Risk Factors:  he does not have symptoms or diagnosis of sleep apnea. he does not have a history of rheumatic fever. he does not have a history of alcohol use. The patient does not have a history of early familial atrial fibrillation or other arrhythmias.  he has a BMI of Body mass index is 30.24 kg/m.Marland Kitchen Filed Weights   05/28/20 1138  Weight: 92.9 kg    Family History  Problem Relation Age of Onset  . Heart attack Mother   . Stroke Father   . Colon cancer Neg Hx   . Esophageal cancer Neg Hx   .  Stomach cancer Neg Hx   . Rectal cancer Neg Hx   . Colon polyps Neg Hx      Atrial Fibrillation Management history:  Previous antiarrhythmic drugs: Multaq Previous cardioversions: 04/27/20 Previous ablations: none CHADS2VASC score: 4 Anticoagulation history: Eliquis    Past Medical History:  Diagnosis Date  . Arthritis   . Diabetes ([place])   . GERD (gastroesophageal reflux disease)   . Hyperlipidemia   . Hypertension   . 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 APPENDECTOMY  1980  . CARDIOVERSION N/A 04/27/2020   Procedure: CARDIOVERSION;  Surgeon: Dorothy Spark, MD;  Location: [facility] ENDOSCOPY;  Service: Cardiovascular;  Laterality: N/A;  . CARDIOVERSION N/A 05/19/2020   Procedure: CARDIOVERSION;  Surgeon: Elouise Munroe, MD;  Location: [facility] ENDOSCOPY;  Service: Cardiovascular;  Laterality: N/A;  . COLONOSCOPY    . IR IMAGING GUIDED PORT INSERTION  02/23/2020    Current Outpatient Medications  Medication Sig Dispense Refill  . allopurinol (ZYLOPRIM) 100 MG tablet Take 100 mg by mouth daily.    Marland Kitchen apixaban (ELIQUIS) 5 MG TABS tablet Take 1 tablet (5 mg total) by mouth 2 (two) times daily. 180 tablet 3  . colchicine 0.6 MG tablet Take 1 tablet (0.6 mg total) by mouth daily as needed (gout flare). Take daily for 3 days, then as needed for gout flare 30 tablet 0  . diltiazem (CARDIZEM CD) 180 MG 24 hr capsule Take 1 capsule (180 mg total) by mouth daily. 90 capsule 3  . fenofibrate (TRICOR) 145 MG  tablet Take 145 mg by mouth daily.    . finasteride (PROSCAR) 5 MG tablet Take 5 mg by mouth daily.    . furosemide (LASIX) 40 MG tablet Take 1 tablet (40 mg total) by mouth daily. 30 tablet 3  . lidocaine-prilocaine (EMLA) cream Apply 1 application topically as needed. 30 g 3  . magnesium oxide (MAG-OX) 400 MG tablet TAKE 1 TABLET BY MOUTH EVERY DAY 30 tablet 0  . metFORMIN (GLUCOPHAGE) 500 MG tablet Take 500 mg by mouth 2 (two) times  daily with a meal.     . ondansetron (ZOFRAN) 8 MG tablet Take 1 tablet (8 mg total) by mouth 2 (two) times daily as needed. Start on the third day after chemotherapy. 30 tablet 1  . pantoprazole (PROTONIX) 40 MG tablet Take 1 tablet (40 mg total) by mouth 2 (two) times daily. 180 tablet 0  . pravastatin (PRAVACHOL) 40 MG tablet Take 40 mg by mouth daily.    . sucralfate (CARAFATE) 1 g tablet TAKE 1 TABLET BY MOUTH EVERY 6 HOURS AS NEEDED. 180 tablet 0  . tamsulosin (FLOMAX) 0.4 MG CAPS capsule Take 0.4 mg by mouth daily.    . Vitamin D, Ergocalciferol, (DRISDOL) 1.25 MG (50000 UT) CAPS capsule Take 50,000 Units by mouth every 7 (seven) days.     No current facility-administered medications for this encounter.    No Known Allergies  Social History   Socioeconomic History  . Marital status: Married    Spouse name: Not on file  . Number of children: 3  . Years of education: Not on file  . Highest education level: Not on file  Occupational History  . Not on file  Tobacco Use  . Smoking status: Never Smoker  . Smokeless tobacco: Current User    Types: Chew  . Tobacco comment: for 60 years, 1-2 daily   Vaping Use  . Vaping Use: Never used  Substance and Sexual Activity  . Alcohol use: Not Currently    Comment: occasionally  . Drug use: Never  . Sexual activity: Not on file  Other Topics Concern  . Not on file  Social History Narrative  . Not on file   Social Determinants of Health   Financial Resource Strain:   . Difficulty of Paying Living Expenses:   Food Insecurity:   . Worried About Charity fundraiser in the Last Year:   . Arboriculturist in the Last Year:   Transportation Needs: No Transportation Needs  . Lack of Transportation (Medical): No  . Lack of Transportation (Non-Medical): No  Physical Activity: Insufficiently Active  . Days of Exercise per Week: 1 day  . Minutes of Exercise per Session: 50 min  Stress:   . Feeling of Stress :   Social Connections:  Unknown  . Frequency of Communication with Friends and Family: More than three times a week  . Frequency of Social Gatherings with Friends and Family: More than three times a week  . Attends Religious Services: Not on file  . Active Member of Clubs or Organizations: Not on file  . Attends Archivist Meetings: Not on file  . Marital Status: Married  Human resources officer Violence:   . Fear of Current or Ex-Partner:   . Emotionally Abused:   Marland Kitchen Physically Abused:   . Sexually Abused:      ROS- All systems are reviewed and negative except as per the HPI above.  Physical Exam: Vitals:   05/28/20 1138  BP: (!) 170/90  Pulse: 91  Weight: 92.9 kg  Height: 5\' 9"  (1.753 m)    GEN- The patient is well appearing, alert and oriented x 3 today.   HEENT-head normocephalic, atraumatic, sclera clear, conjunctiva pink, hearing intact, trachea midline. Lungs- Clear to ausculation bilaterally, normal work of breathing Heart- irregular rate and rhythm, no murmurs, rubs or gallops  GI- soft, NT, ND, + BS Extremities- no clubbing, cyanosis, or edema MS- no significant deformity or atrophy Skin- no rash or lesion Psych- euthymic mood, full affect Neuro- strength and sensation are intact   Wt Readings from Last 3 Encounters:  05/28/20 92.9 kg  05/22/20 93.7 kg  05/19/20 100.5 kg    EKG today demonstrates afib HR 91, QRS 84, QTc 440  Echo 04/13/20 demonstrated  1. Definity used; normal LV systolic function; mild LVH; mildly dilated  aortic root; mild AI; mild MR.  2. Left ventricular ejection fraction, by estimation, is 60 to 65%. The  left ventricle has normal function. The left ventricle has no regional  wall motion abnormalities. There is mild left ventricular hypertrophy.  Left ventricular diastolic function  could not be evaluated.  3. Right ventricular systolic function is normal. The right ventricular  size is normal. There is mildly elevated pulmonary artery systolic    pressure.  4. The mitral valve is normal in structure. Mild mitral valve  regurgitation. No evidence of mitral stenosis.  5. The aortic valve is tricuspid. Aortic valve regurgitation is mild. No  aortic stenosis is present.  6. Aortic dilatation noted. There is mild dilatation of the aortic root  measuring 41 mm.  7. The inferior vena cava is normal in size with greater than 50%  respiratory variability, suggesting right atrial pressure of 3 mmHg.   Epic records are reviewed at length today  CHA2DS2-VASc Score = 4  The patient's score is based upon: CHF History: 0 HTN History: 1 Age : 1 Diabetes History: 1 Stroke History: 0 Vascular Disease History: 1 Gender: 0      ASSESSMENT AND PLAN: 1. Persistent Atrial Fibrillation (ICD10:  I48.19) The patient's CHA2DS2-VASc score is 4, indicating a 4.8% annual risk of stroke.   S/p DCCV on 04/27/20 with ERAF. Started on Multaq with repeat DCCV on 05/19/20 and again reverted to afib. We discussed therapeutic options today.  AAD options limited. He has failed Multaq. He has coronary calcifications on CT with a calcium score in the 93rd percentile, hesitant to use class 1C. His QT in SR is 455 and he frequently uses Zofran for nausea with his chemo which is a disincentive to use Tikosyn or sotalol.  At this point, rate control may be his best option given his paucity of symptoms and comorbidities. Patient in agreement with plan. Continue Eliquis 5 mg BID Continue diltiazem 180 mg daily  2. Secondary Hypercoagulable State (ICD10:  D68.69) The patient is at significant risk for stroke/thromboembolism based upon his CHA2DS2-VASc Score of 4.  Continue Apixaban (Eliquis).   3. Obesity Body mass index is 30.24 kg/m. Lifestyle modification was discussed and encouraged including regular physical activity and weight reduction.  4. CAD Coronary calcifications seen on CT, negative stress test. No anginal symptoms.  5. HTN Elevated today.  Patient has appointment next with with pharmacy to discuss HTN medications.    Follow up with Kerin Ransom and Dr Gwenlyn Found as scheduled.    [facility] [address] [place], [place] [address] [phone] 05/28/2020 12:03 PM

## 2020-05-28 NOTE — Telephone Encounter (Signed)
Called patient and spoke to wife- advised of note from Dr.Berry to see PharmD.  I seen appointment opening for next week. She took this spot will have them see them. Patient wife verbalized understanding.

## 2020-05-31 ENCOUNTER — Other Ambulatory Visit: Payer: Self-pay

## 2020-05-31 ENCOUNTER — Inpatient Hospital Stay: Payer: Medicare HMO | Attending: Hematology

## 2020-05-31 ENCOUNTER — Inpatient Hospital Stay: Payer: Medicare HMO

## 2020-05-31 ENCOUNTER — Inpatient Hospital Stay (HOSPITAL_BASED_OUTPATIENT_CLINIC_OR_DEPARTMENT_OTHER): Payer: Medicare HMO | Admitting: Hematology

## 2020-05-31 ENCOUNTER — Encounter: Payer: Self-pay | Admitting: Hematology

## 2020-05-31 VITALS — BP 154/88 | HR 83 | Temp 98.1°F | Resp 16 | Wt 206.7 lb

## 2020-05-31 DIAGNOSIS — I10 Essential (primary) hypertension: Secondary | ICD-10-CM

## 2020-05-31 DIAGNOSIS — C221 Intrahepatic bile duct carcinoma: Secondary | ICD-10-CM | POA: Insufficient documentation

## 2020-05-31 DIAGNOSIS — Z95828 Presence of other vascular implants and grafts: Secondary | ICD-10-CM

## 2020-05-31 DIAGNOSIS — Z5111 Encounter for antineoplastic chemotherapy: Secondary | ICD-10-CM | POA: Diagnosis not present

## 2020-05-31 DIAGNOSIS — Z7189 Other specified counseling: Secondary | ICD-10-CM

## 2020-05-31 LAB — CMP (CANCER CENTER ONLY)
ALT: 14 U/L (ref 0–44)
AST: 19 U/L (ref 15–41)
Albumin: 3.7 g/dL (ref 3.5–5.0)
Alkaline Phosphatase: 42 U/L (ref 38–126)
Anion gap: 9 (ref 5–15)
BUN: 27 mg/dL — ABNORMAL HIGH (ref 8–23)
CO2: 26 mmol/L (ref 22–32)
Calcium: 10.2 mg/dL (ref 8.9–10.3)
Chloride: 107 mmol/L (ref 98–111)
Creatinine: 1.36 mg/dL — ABNORMAL HIGH (ref 0.61–1.24)
GFR, Est AFR Am: 60 mL/min — ABNORMAL LOW (ref >60)
GFR, Estimated: 52 mL/min — ABNORMAL LOW (ref >60)
Glucose, Bld: 102 mg/dL — ABNORMAL HIGH (ref 70–99)
Potassium: 3.9 mmol/L (ref 3.5–5.1)
Sodium: 142 mmol/L (ref 135–145)
Total Bilirubin: 0.5 mg/dL (ref 0.3–1.2)
Total Protein: 6.4 g/dL — ABNORMAL LOW (ref 6.5–8.1)

## 2020-05-31 LAB — CBC WITH DIFFERENTIAL (CANCER CENTER ONLY)
Abs Immature Granulocytes: 0.02 10*3/uL (ref 0.00–0.07)
Basophils Absolute: 0 10*3/uL (ref 0.0–0.1)
Basophils Relative: 1 %
Eosinophils Absolute: 0.1 10*3/uL (ref 0.0–0.5)
Eosinophils Relative: 2 %
HCT: 31.9 % — ABNORMAL LOW (ref 39.0–52.0)
Hemoglobin: 10.4 g/dL — ABNORMAL LOW (ref 13.0–17.0)
Immature Granulocytes: 0 %
Lymphocytes Relative: 21 %
Lymphs Abs: 1.1 10*3/uL (ref 0.7–4.0)
MCH: 35.3 pg — ABNORMAL HIGH (ref 26.0–34.0)
MCHC: 32.6 g/dL (ref 30.0–36.0)
MCV: 108.1 fL — ABNORMAL HIGH (ref 80.0–100.0)
Monocytes Absolute: 0.9 10*3/uL (ref 0.1–1.0)
Monocytes Relative: 16 %
Neutro Abs: 3.2 10*3/uL (ref 1.7–7.7)
Neutrophils Relative %: 60 %
Platelet Count: 268 10*3/uL (ref 150–400)
RBC: 2.95 MIL/uL — ABNORMAL LOW (ref 4.22–5.81)
RDW: 15.9 % — ABNORMAL HIGH (ref 11.5–15.5)
WBC Count: 5.4 10*3/uL (ref 4.0–10.5)
nRBC: 0 % (ref 0.0–0.2)

## 2020-05-31 MED ORDER — SODIUM CHLORIDE 0.9 % IV SOLN
Freq: Once | INTRAVENOUS | Status: AC
  Filled 2020-05-31: qty 250

## 2020-05-31 MED ORDER — PROCHLORPERAZINE MALEATE 10 MG PO TABS
ORAL_TABLET | ORAL | Status: AC
  Filled 2020-05-31: qty 1

## 2020-05-31 MED ORDER — SODIUM CHLORIDE 0.9% FLUSH
10.0000 mL | Freq: Once | INTRAVENOUS | Status: AC
  Administered 2020-05-31: 10 mL
  Filled 2020-05-31: qty 10

## 2020-05-31 MED ORDER — HEPARIN SOD (PORK) LOCK FLUSH 100 UNIT/ML IV SOLN
500.0000 [IU] | Freq: Once | INTRAVENOUS | Status: AC | PRN
  Administered 2020-05-31: 500 [IU]
  Filled 2020-05-31: qty 5

## 2020-05-31 MED ORDER — MAGNESIUM OXIDE 400 MG PO TABS: 1.0000 | ORAL_TABLET | Freq: Every day | ORAL | 0 refills | Status: DC

## 2020-05-31 MED ORDER — PROCHLORPERAZINE MALEATE 10 MG PO TABS
10.0000 mg | ORAL_TABLET | Freq: Once | ORAL | Status: AC
  Administered 2020-05-31: 10 mg via ORAL

## 2020-05-31 MED ORDER — SODIUM CHLORIDE 0.9% FLUSH
10.0000 mL | INTRAVENOUS | Status: DC | PRN
  Administered 2020-05-31: 10 mL
  Filled 2020-05-31: qty 10

## 2020-05-31 MED ORDER — SODIUM CHLORIDE 0.9 % IV SOLN
1000.0000 mg/m2 | Freq: Once | INTRAVENOUS | Status: AC
  Administered 2020-05-31: 2166 mg via INTRAVENOUS
  Filled 2020-05-31: qty 56.97

## 2020-05-31 NOTE — Patient Instructions (Signed)
[facility] Discharge Instructions for Patients Receiving Chemotherapy  Today you received the following chemotherapy agents Gemcitabine (GEMZAR).  To help prevent nausea and vomiting after your treatment, we encourage you to take your nausea medication as prescribed.   If you develop nausea and vomiting that is not controlled by your nausea medication, call the clinic.   BELOW ARE SYMPTOMS THAT SHOULD BE REPORTED IMMEDIATELY:  *FEVER GREATER THAN 100.5 F  *CHILLS WITH OR WITHOUT FEVER  NAUSEA AND VOMITING THAT IS NOT CONTROLLED WITH YOUR NAUSEA MEDICATION  *UNUSUAL SHORTNESS OF BREATH  *UNUSUAL BRUISING OR BLEEDING  TENDERNESS IN MOUTH AND THROAT WITH OR WITHOUT PRESENCE OF ULCERS  *URINARY PROBLEMS  *BOWEL PROBLEMS  UNUSUAL RASH Items with * indicate a potential emergency and should be followed up as soon as possible.  Feel free to call the clinic should you have any questions or concerns. The clinic phone number is [phone].  Please show the CHEMO ALERT CARD at check-in to the Emergency Department and triage nurse.   

## 2020-06-01 ENCOUNTER — Ambulatory Visit (INDEPENDENT_AMBULATORY_CARE_PROVIDER_SITE_OTHER): Payer: Medicare HMO | Admitting: Pharmacist Clinician (PhC)/ Clinical Pharmacy Specialist

## 2020-06-01 ENCOUNTER — Telehealth: Payer: Self-pay | Admitting: Hematology

## 2020-06-01 VITALS — BP 182/102 | HR 64

## 2020-06-01 DIAGNOSIS — I10 Essential (primary) hypertension: Secondary | ICD-10-CM | POA: Diagnosis not present

## 2020-06-01 MED ORDER — HYDRALAZINE HCL 50 MG PO TABS
50.0000 mg | ORAL_TABLET | Freq: Two times a day (BID) | ORAL | 3 refills | Status: DC
Start: 2020-06-01 — End: 2020-08-24

## 2020-06-01 NOTE — Patient Instructions (Signed)
Return for a a follow up appointment with Brandon Sherman on August 4   Check your blood pressure at home daily (if able) and keep record of the readings.  Take your BP meds as follows:  Start hydralazine 25 mg twice daily (1/2 tablet).  After 6 days, increase dose to 1 tablet twice daily.    Colchicine - your insurance looks like it covers a brand called Mitigare.  I'll call your pharmacy and double check that this is the brand they have chosen.  If it is still cost prohibitive, please le your PCP know tomorrow.     Bring all of your meds, your BP cuff and your record of home blood pressures to your next appointment.  Exercise as you're able, try to walk approximately 30 minutes per day.  Keep salt intake to a minimum, especially watch canned and prepared boxed foods.  Eat more fresh fruits and vegetables and fewer canned items.  Avoid eating in fast food restaurants.    HOW TO TAKE YOUR BLOOD PRESSURE: . Rest 5 minutes before taking your blood pressure. .  Don't smoke or drink caffeinated beverages for at least 30 minutes before. . Take your blood pressure before (not after) you eat. . Sit comfortably with your back supported and both feet on the floor (don't cross your legs). . Elevate your arm to heart level on a table or a desk. . Use the proper sized cuff. It should fit smoothly and snugly around your bare upper arm. There should be enough room to slip a fingertip under the cuff. The bottom edge of the cuff should be 1 inch above the crease of the elbow. . Ideally, take 3 measurements at one sitting and record the average.

## 2020-06-01 NOTE — Telephone Encounter (Signed)
Scheduled per 7/12 los. Unable to reach pt. Left voicemail with a appt times and dates. Will be sending pt appt calendar.

## 2020-06-01 NOTE — Progress Notes (Signed)
S: HPI: Brandon Sherman is a 70 YOWM who is a pt of Dr. Kennon Holter who presents to the clinic today for a med rec. Pt wanted to verify what medications they should be on and provider referred pt to CPP for med rec. BP meds that pt was previously on include amlodipine and lisinopril. Amlodipine was switched to diltiazem d/t pt having AFib. Lisinopril was d/c since pt is currently on chemotherapy and experienced an increase in SCr. Pt did not bring in his medication bottles for [place] to compare to his current med list, but he brought in his printed medication list that they have edited as medications were added and d/c.   Medication SIG Indication Still taking?   Allopurinol 100 mg QDaily Gout prevention X  Apixaban 5 mg  1 BID Stroke prevention X  Colchicine 0.6 mg 1 tab daily as needed for 3 days, then as needed Gout flares Was too expensive  Diltiazem 180 mg  QDaily AFib X  Fenofibrate 145 mg  QDaily Triglycerides  X  Finasteride 5 mg  QDaily Prostate X  Furosemide 40 mg  QDaily Fluid/heart failure  X  Lidocaine-prilocaine cream Topically PRN Pain relief cream X  Magnesium oxide 400 mg  QDaily Magnesium supplement/antacid X  Metformin 500 mg  1 BID with meals Diabetes X  Ondansetron 8 mg  1 BID PRN start on third day after chemo Nausea X  Pantoprazole 40 mg 1 BID Stomach acid X  Pravastatin 40 mg QDaily Cholesterol  X  Sucralfate 1 g Q6H PRN Ulcers/coats lining of stomach X  Tamsulosin 0.4 mg  QDaily Prostate X  Vitamin D 50,000 IU Q 7 days Vitamin D deficiency X   Pt reported some occasional Tylenol use, but no other medications other than the updated list.   O:  CMP: on 05/31/20, Na 142, K 3.9, BUN 27 (high, 8-23), SCr 1.36 (high, 0.61-1.24), glucose 102 (high, 70-99)  Renal function: eGFR: 52 (low, > 60), SCr: 1.36 (high), CrCl: 65 mL/min  BP average since hospital visit on 05/20/20 is about 171/94, range of 141-194/79-107, average heart rate of about 88   BP today: 188/100, HR 64   A/P: 1.  Uncontrolled HTN - Pt BP today is 188/100 - Pt home BP readings average 171/94 (range 141-194/79-107) - Pt BP is currently above goal of < 130/80 - D/t pt potential nephrotoxicity of chemotherapy, want to avoid agents that could affect the kidneys for now  - Have pt continue current medications of diltiazem 180 mg (AFib) - Have pt initiate hydralazine 50 mg tabs,  tab BID for 6 days, increase to 1 tab BID if BP not at goal after 6 days - Reassess BP in 4 to 6 weeks  Gurshaan Matsuoka [employer] [employer]   I was present during entire visit and agree with above plan.   Tommy Medal PharmD CPP [facility]  [facility] at [employer]

## 2020-06-02 DIAGNOSIS — R972 Elevated prostate specific antigen [PSA]: Secondary | ICD-10-CM | POA: Diagnosis not present

## 2020-06-02 DIAGNOSIS — I129 Hypertensive chronic kidney disease with stage 1 through stage 4 chronic kidney disease, or unspecified chronic kidney disease: Secondary | ICD-10-CM | POA: Diagnosis not present

## 2020-06-02 DIAGNOSIS — I10 Essential (primary) hypertension: Secondary | ICD-10-CM | POA: Diagnosis not present

## 2020-06-02 DIAGNOSIS — I4891 Unspecified atrial fibrillation: Secondary | ICD-10-CM | POA: Diagnosis not present

## 2020-06-02 DIAGNOSIS — E1122 Type 2 diabetes mellitus with diabetic chronic kidney disease: Secondary | ICD-10-CM | POA: Diagnosis not present

## 2020-06-02 DIAGNOSIS — C221 Intrahepatic bile duct carcinoma: Secondary | ICD-10-CM | POA: Diagnosis not present

## 2020-06-02 DIAGNOSIS — N182 Chronic kidney disease, stage 2 (mild): Secondary | ICD-10-CM | POA: Diagnosis not present

## 2020-06-02 DIAGNOSIS — E559 Vitamin D deficiency, unspecified: Secondary | ICD-10-CM | POA: Diagnosis not present

## 2020-06-02 DIAGNOSIS — M109 Gout, unspecified: Secondary | ICD-10-CM | POA: Diagnosis not present

## 2020-06-04 NOTE — Progress Notes (Signed)
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 Findings are consistent with stable or minimally improved cholangiocarcinoma.  CT Chest 08/18/2019: Multiple pulmonary nodules largest at approximately 7 mm in the right lower lobe, nonspecific but concerning given findings in the liver.  No signs of definitive metastatic disease, also with mildly enlarged upper abdominal lymph nodes as discussed.  MRI Liver 07/31/2019: 7.3 cm in long axis mass in the right hepatic lobe spanning into the caudate lobe, high suspicion for malignancy such as hepatocellular carcinoma or cholangiocarcinoma. Suspected effacement or occlusion of the right hepatic vein and posterior branches of the right portal vein. Two smaller tumor nodules along the posterior periphery of the dominant mass. Tissue diagnosis is recommended. No findings of pathologic adenopathy or distant metastatic spread.  CT AP 06/28/2019: Heterogeneous hypodensity posteriorly in the right hepatic lobe and potentially extending into the caudate lobe suspicious for a mass. There is felt to be truncation of branches of the portal vein in this vicinity and some narrowing of the hepatic vein, as well as triangular-shaped regions of abnormal hypoenhancement posteriorly in the right hepatic lobe likely representing downstream vascular effects. Cannot exclude malignancy such as cholangiocarcinoma or hepatocellular carcinoma.  4 mm right middle lobe pulmonary nodule is likely benign but may merit surveillance  Biopsies of Right Lobe Liver   Past/Anticipated interventions by surgeon, if any:   Past/Anticipated interventions by  medical oncology, if any:  Dr. Burr Medico 7/12/201 -He was seen byour local surgeon Dr. 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 -I discussed given his cancer is non-resectable his cancer is likely not curable but still treatable. Istarted him onstandardfirst line chemo with IV Cisplatin and Gemcitabine 2 weeks on/1 weekoff beginning 09/29/19.Cisplatin was held since C9 (03/23/20) due to fluid status/AFib. He is now on maintenance Gemcitabine alone. -Given recent heart issues, and stable lung nodules, no evidence of metastatic disease, I discussed target radiation or more invasive Y90 Radio embolization to liver for disease control and give him chemo break until his cancer starts growing again. He notes he is open to this. I will review with in next GI Tumor Board. May stop chemo after C12 if Rad Onc think he is a candidate for radiation.    Weight changes, if any: Lost about 20 pounds when chemo first started, has since gained it back.  Bowel/Bladder complaints, if any: No  Nausea / Vomiting, if any: No  Pain issues, if any:  Nagging feeling in his right abdomen.     SAFETY ISSUES:  Prior radiation? No  Pacemaker/ICD? No  Possible current pregnancy? n/a  Is the patient on methotrexate? No  BP (!) 154/88 (BP Location: Left Arm, Patient Position: Sitting, Cuff Size: Large)   Pulse 92   Temp 98.9 F (37.2 C)   Resp 20   Ht 5\' 9"  (1.753 m)   Wt 211 lb 6.4 oz (95.9 kg)   SpO2 100%   BMI 31.22 kg/m    Wt Readings from Last 3 Encounters:  06/08/20 211 lb 6.4 oz (95.9 kg)  06/07/20 207 lb (93.9 kg)  05/31/20 206 lb 11.2 oz (  93.8 kg)    Current Complaints/Details: Takes Metformin

## 2020-06-07 ENCOUNTER — Inpatient Hospital Stay: Payer: Medicare HMO

## 2020-06-07 ENCOUNTER — Other Ambulatory Visit: Payer: Self-pay

## 2020-06-07 VITALS — BP 162/78 | HR 79 | Temp 98.0°F | Resp 18 | Wt 207.0 lb

## 2020-06-07 DIAGNOSIS — Z5111 Encounter for antineoplastic chemotherapy: Secondary | ICD-10-CM | POA: Diagnosis not present

## 2020-06-07 DIAGNOSIS — Z95828 Presence of other vascular implants and grafts: Secondary | ICD-10-CM

## 2020-06-07 DIAGNOSIS — C221 Intrahepatic bile duct carcinoma: Secondary | ICD-10-CM | POA: Diagnosis not present

## 2020-06-07 DIAGNOSIS — Z7189 Other specified counseling: Secondary | ICD-10-CM

## 2020-06-07 LAB — CBC WITH DIFFERENTIAL (CANCER CENTER ONLY)
Abs Immature Granulocytes: 0.1 10*3/uL — ABNORMAL HIGH (ref 0.00–0.07)
Basophils Absolute: 0 10*3/uL (ref 0.0–0.1)
Basophils Relative: 1 %
Eosinophils Absolute: 0 10*3/uL (ref 0.0–0.5)
Eosinophils Relative: 1 %
HCT: 29.1 % — ABNORMAL LOW (ref 39.0–52.0)
Hemoglobin: 9.5 g/dL — ABNORMAL LOW (ref 13.0–17.0)
Immature Granulocytes: 3 %
Lymphocytes Relative: 31 %
Lymphs Abs: 1.2 10*3/uL (ref 0.7–4.0)
MCH: 35.6 pg — ABNORMAL HIGH (ref 26.0–34.0)
MCHC: 32.6 g/dL (ref 30.0–36.0)
MCV: 109 fL — ABNORMAL HIGH (ref 80.0–100.0)
Monocytes Absolute: 0.7 10*3/uL (ref 0.1–1.0)
Monocytes Relative: 19 %
Neutro Abs: 1.7 10*3/uL (ref 1.7–7.7)
Neutrophils Relative %: 45 %
Platelet Count: 139 10*3/uL — ABNORMAL LOW (ref 150–400)
RBC: 2.67 MIL/uL — ABNORMAL LOW (ref 4.22–5.81)
RDW: 14.9 % (ref 11.5–15.5)
WBC Count: 3.8 10*3/uL — ABNORMAL LOW (ref 4.0–10.5)
nRBC: 1 % — ABNORMAL HIGH (ref 0.0–0.2)

## 2020-06-07 LAB — CMP (CANCER CENTER ONLY)
ALT: 23 U/L (ref 0–44)
AST: 27 U/L (ref 15–41)
Albumin: 3.3 g/dL — ABNORMAL LOW (ref 3.5–5.0)
Alkaline Phosphatase: 51 U/L (ref 38–126)
Anion gap: 10 (ref 5–15)
BUN: 21 mg/dL (ref 8–23)
CO2: 24 mmol/L (ref 22–32)
Calcium: 9.3 mg/dL (ref 8.9–10.3)
Chloride: 107 mmol/L (ref 98–111)
Creatinine: 1.38 mg/dL — ABNORMAL HIGH (ref 0.61–1.24)
GFR, Est AFR Am: 59 mL/min — ABNORMAL LOW (ref >60)
GFR, Estimated: 51 mL/min — ABNORMAL LOW (ref >60)
Glucose, Bld: 103 mg/dL — ABNORMAL HIGH (ref 70–99)
Potassium: 3.6 mmol/L (ref 3.5–5.1)
Sodium: 141 mmol/L (ref 135–145)
Total Bilirubin: 0.6 mg/dL (ref 0.3–1.2)
Total Protein: 6.4 g/dL — ABNORMAL LOW (ref 6.5–8.1)

## 2020-06-07 MED ORDER — HEPARIN SOD (PORK) LOCK FLUSH 100 UNIT/ML IV SOLN
500.0000 [IU] | Freq: Once | INTRAVENOUS | Status: AC | PRN
  Administered 2020-06-07: 500 [IU]
  Filled 2020-06-07: qty 5

## 2020-06-07 MED ORDER — SODIUM CHLORIDE 0.9 % IV SOLN
Freq: Once | INTRAVENOUS | Status: AC
  Filled 2020-06-07: qty 250

## 2020-06-07 MED ORDER — SODIUM CHLORIDE 0.9 % IV SOLN
1000.0000 mg/m2 | Freq: Once | INTRAVENOUS | Status: AC
  Administered 2020-06-07: 2166 mg via INTRAVENOUS
  Filled 2020-06-07: qty 56.97

## 2020-06-07 MED ORDER — SODIUM CHLORIDE 0.9% FLUSH
10.0000 mL | INTRAVENOUS | Status: DC | PRN
  Administered 2020-06-07: 10 mL
  Filled 2020-06-07: qty 10

## 2020-06-07 MED ORDER — PROCHLORPERAZINE MALEATE 10 MG PO TABS
ORAL_TABLET | ORAL | Status: AC
  Filled 2020-06-07: qty 1

## 2020-06-07 MED ORDER — SODIUM CHLORIDE 0.9% FLUSH
10.0000 mL | Freq: Once | INTRAVENOUS | Status: AC
  Administered 2020-06-07: 10 mL
  Filled 2020-06-07: qty 10

## 2020-06-07 MED ORDER — PROCHLORPERAZINE MALEATE 10 MG PO TABS
10.0000 mg | ORAL_TABLET | Freq: Once | ORAL | Status: AC
  Administered 2020-06-07: 10 mg via ORAL

## 2020-06-07 NOTE — Patient Instructions (Signed)
[facility] Discharge Instructions for Patients Receiving Chemotherapy  Today you received the following chemotherapy agents: gemcitabine.  To help prevent nausea and vomiting after your treatment, we encourage you to take your nausea medication as directed.   If you develop nausea and vomiting that is not controlled by your nausea medication, call the clinic.   BELOW ARE SYMPTOMS THAT SHOULD BE REPORTED IMMEDIATELY:  *FEVER GREATER THAN 100.5 F  *CHILLS WITH OR WITHOUT FEVER  NAUSEA AND VOMITING THAT IS NOT CONTROLLED WITH YOUR NAUSEA MEDICATION  *UNUSUAL SHORTNESS OF BREATH  *UNUSUAL BRUISING OR BLEEDING  TENDERNESS IN MOUTH AND THROAT WITH OR WITHOUT PRESENCE OF ULCERS  *URINARY PROBLEMS  *BOWEL PROBLEMS  UNUSUAL RASH Items with * indicate a potential emergency and should be followed up as soon as possible.  Feel free to call the clinic should you have any questions or concerns. The clinic phone number is [phone].  Please show the CHEMO ALERT CARD at check-in to the Emergency Department and triage nurse.   

## 2020-06-08 ENCOUNTER — Other Ambulatory Visit: Payer: Self-pay

## 2020-06-08 ENCOUNTER — Ambulatory Visit
Admission: RE | Admit: 2020-06-08 | Discharge: 2020-06-08 | Disposition: A | Discharge status: Home / Self Care | Payer: Medicare HMO | Source: Ambulatory Visit | Attending: Radiation Oncology | Admitting: Radiation Oncology

## 2020-06-08 ENCOUNTER — Encounter: Payer: Self-pay | Admitting: Radiation Oncology

## 2020-06-08 VITALS — BP 154/88 | HR 92 | Temp 98.9°F | Resp 20 | Ht 69.0 in | Wt 211.4 lb

## 2020-06-08 DIAGNOSIS — C221 Intrahepatic bile duct carcinoma: Secondary | ICD-10-CM | POA: Insufficient documentation

## 2020-06-08 DIAGNOSIS — J90 Pleural effusion, not elsewhere classified: Secondary | ICD-10-CM | POA: Diagnosis not present

## 2020-06-08 DIAGNOSIS — I083 Combined rheumatic disorders of mitral, aortic and tricuspid valves: Secondary | ICD-10-CM | POA: Insufficient documentation

## 2020-06-08 DIAGNOSIS — E785 Hyperlipidemia, unspecified: Secondary | ICD-10-CM | POA: Insufficient documentation

## 2020-06-08 DIAGNOSIS — K219 Gastro-esophageal reflux disease without esophagitis: Secondary | ICD-10-CM | POA: Diagnosis not present

## 2020-06-08 DIAGNOSIS — R918 Other nonspecific abnormal finding of lung field: Secondary | ICD-10-CM | POA: Insufficient documentation

## 2020-06-08 DIAGNOSIS — I10 Essential (primary) hypertension: Secondary | ICD-10-CM | POA: Insufficient documentation

## 2020-06-08 DIAGNOSIS — Z7984 Long term (current) use of oral hypoglycemic drugs: Secondary | ICD-10-CM | POA: Diagnosis not present

## 2020-06-08 DIAGNOSIS — E119 Type 2 diabetes mellitus without complications: Secondary | ICD-10-CM | POA: Diagnosis not present

## 2020-06-08 DIAGNOSIS — M129 Arthropathy, unspecified: Secondary | ICD-10-CM | POA: Diagnosis not present

## 2020-06-08 DIAGNOSIS — Z79899 Other long term (current) drug therapy: Secondary | ICD-10-CM | POA: Insufficient documentation

## 2020-06-08 DIAGNOSIS — I4891 Unspecified atrial fibrillation: Secondary | ICD-10-CM | POA: Diagnosis not present

## 2020-06-08 DIAGNOSIS — Z9221 Personal history of antineoplastic chemotherapy: Secondary | ICD-10-CM | POA: Diagnosis not present

## 2020-06-08 DIAGNOSIS — I5031 Acute diastolic (congestive) heart failure: Secondary | ICD-10-CM | POA: Insufficient documentation

## 2020-06-08 NOTE — Progress Notes (Signed)
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   HISTORY OF PRESENT ILLNESS: Brandon Sherman is a 74 y.o. male seen at the request of Dr. Burr Medico for a diagnosis of intrahepatic cholangiocarcinoma.  The patient was originally found to have a mass in the posterior aspect of the right hepatic lobe extending into the caudate suspicious for a mass in August 2020.  Further imaging with MRI on 07/31/2019 revealed a 7.3 cm mass in the right hepatic lobe spanning the caudate with suspected effacement or occlusion of the right hepatic vein and posterior branches of the right portal vein, 2 smaller tumor nodules along the posterior periphery of the dominant mass were noted, on 08/11/2019, he underwent biopsy of the mass which revealed adenocarcinoma.  Further staging showed multiple nonspecific pulmonary nodules and no other concerns for metastatic disease.  He began systemic cisplatin and gemcitabine on 09/29/2019, he has had stability of disease on imaging, there was minimally decreased changes noted as well on his most recent CT on 04/08/2020, with the tumor now measuring 5.7 x 5 cm.  His pulmonary nodules have remained stable.  Of note he has had some cardiac issues throughout the course including holding cycles due to congestive heart failure and atrial fibrillation.  He is seen today to discuss the options of stereotactic body radiotherapy in order to have a holiday from chemotherapy until his disease were to start growing again.  Y 90 was also discussed as a possibility to downsize his tumor.    PREVIOUS RADIATION THERAPY: No   PAST MEDICAL HISTORY:  Past Medical History:  Diagnosis Date  . Arthritis   . Diabetes ([place])   . GERD  (gastroesophageal reflux disease)   . Hyperlipidemia   . Hypertension   . Intrahepatic cholangiocarcinoma ([place])        PAST SURGICAL HISTORY: Past Surgical History:  Procedure Laterality Date  . APPENDECTOMY  1980  . CARDIOVERSION N/A 04/27/2020   Procedure: CARDIOVERSION;  Surgeon: Dorothy Spark, MD;  Location: [facility] ENDOSCOPY;  Service: Cardiovascular;  Laterality: N/A;  . CARDIOVERSION N/A 05/19/2020   Procedure: CARDIOVERSION;  Surgeon: Elouise Munroe, MD;  Location: [facility] ENDOSCOPY;  Service: Cardiovascular;  Laterality: N/A;  . COLONOSCOPY    . IR IMAGING GUIDED PORT INSERTION  02/23/2020     FAMILY HISTORY:  Family History  Problem Relation Age of Onset  . Heart attack Mother   . Stroke Father   . Colon cancer Neg Hx   . Esophageal cancer Neg Hx   . Stomach cancer Neg Hx   . Rectal cancer Neg Hx   . Colon polyps Neg Hx      SOCIAL HISTORY:  reports that he has never smoked. His smokeless tobacco use includes chew. He reports previous alcohol use. He reports that he does not use drugs.   ALLERGIES: Patient has no known allergies.   MEDICATIONS:  Current Outpatient Medications  Medication Sig Dispense Refill  . allopurinol (ZYLOPRIM) 100 MG tablet Take 100 mg by mouth daily.    Marland Kitchen apixaban (ELIQUIS) 5 MG TABS tablet Take 1 tablet (5 mg total) by mouth 2 (two) times daily. 180 tablet 3  . colchicine 0.6  MG tablet Take 1 tablet (0.6 mg total) by mouth daily as needed (gout flare). Take daily for 3 days, then as needed for gout flare 30 tablet 0  . diltiazem (CARDIZEM CD) 180 MG 24 hr capsule Take 1 capsule (180 mg total) by mouth daily. 90 capsule 3  . fenofibrate (TRICOR) 145 MG tablet Take 145 mg by mouth daily.    . finasteride (PROSCAR) 5 MG tablet Take 5 mg by mouth daily.    . furosemide (LASIX) 40 MG tablet Take 1 tablet (40 mg total) by mouth daily. 30 tablet 3  . hydrALAZINE (APRESOLINE) 50 MG tablet Take 1 tablet (50 mg total) by mouth in the morning and at  bedtime. 60 tablet 3  . lidocaine-prilocaine (EMLA) cream Apply 1 application topically as needed. 30 g 3  . magnesium oxide (MAG-OX) 400 MG tablet Take 1 tablet (400 mg total) by mouth daily. 30 tablet 0  . metFORMIN (GLUCOPHAGE) 500 MG tablet Take 500 mg by mouth 2 (two) times daily with a meal.     . ondansetron (ZOFRAN) 8 MG tablet Take 1 tablet (8 mg total) by mouth 2 (two) times daily as needed. Start on the third day after chemotherapy. 30 tablet 1  . pantoprazole (PROTONIX) 40 MG tablet Take 1 tablet (40 mg total) by mouth 2 (two) times daily. 180 tablet 0  . pravastatin (PRAVACHOL) 40 MG tablet Take 40 mg by mouth daily.    . sucralfate (CARAFATE) 1 g tablet TAKE 1 TABLET BY MOUTH EVERY 6 HOURS AS NEEDED. 180 tablet 0  . tamsulosin (FLOMAX) 0.4 MG CAPS capsule Take 0.4 mg by mouth daily.    . Vitamin D, Ergocalciferol, (DRISDOL) 1.25 MG (50000 UT) CAPS capsule Take 50,000 Units by mouth every 7 (seven) days.     No current facility-administered medications for this encounter.     REVIEW OF SYSTEMS: On review of systems, the patient reports that he is doing well overall. He has occasional discomfort in the RUQ but reports at this time it doesn't effect his day to day activities. He denies any chest pain, shortness of breath, cough, fevers, chills, night sweats, unintended weight changes. 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 He denies any bowel or bladder disturbances, and denies nausea or vomiting. He denies any new musculoskeletal or joint aches or pains. A complete review of systems is obtained and is otherwise negative.     PHYSICAL EXAM:  Wt Readings from Last 3 Encounters:  06/08/20 211 lb 6.4 oz (95.9 kg)  06/07/20 207 lb (93.9 kg)  05/31/20 206 lb 11.2 oz (93.8 kg)   Temp Readings from Last 3 Encounters:  06/08/20 98.9 F (37.2 C)  06/07/20 98 F (36.7 C) (Oral)  05/31/20 98.1 F (36.7 C) (Oral)   BP Readings from Last 3  Encounters:  06/08/20 (!) 154/88  06/07/20 (!) 162/78  06/01/20 (!) 182/102   Pulse Readings from Last 3 Encounters:  06/08/20 92  06/07/20 79  06/01/20 64   Pain Assessment Pain Score: 0-No pain/10  In general this is a well appearing caucasian male in no acute distress. He's alert and oriented x4 and appropriate throughout the examination. Cardiopulmonary assessment is negative for acute distress and he exhibits normal effort.     ECOG = 1  0 - Asymptomatic (Fully active, able to carry on all predisease activities without restriction)  1 - Symptomatic but completely ambulatory (Restricted in physically strenuous  activity but ambulatory and able to carry out work of a light or sedentary nature. For example, light housework, office work)  2 - Symptomatic, <50% in bed during the day (Ambulatory and capable of all self care but unable to carry out any work activities. Up and about more than 50% of waking hours)  3 - Symptomatic, >50% in bed, but not bedbound (Capable of only limited self-care, confined to bed or chair 50% or more of waking hours)  4 - Bedbound (Completely disabled. Cannot carry on any self-care. Totally confined to bed or chair)  5 - Death   Eustace Pen MM, Creech RH, Tormey DC, et al. [phone]). "Toxicity and response criteria of the [facility] Group". [place] Oncol. 5 (6): 649-55    LABORATORY DATA:  Lab Results  Component Value Date   WBC 3.8 (L) 06/07/2020   HGB 9.5 (L) 06/07/2020   HCT 29.1 (L) 06/07/2020   MCV 109.0 (H) 06/07/2020   PLT 139 (L) 06/07/2020   Lab Results  Component Value Date   NA 141 06/07/2020   K 3.6 06/07/2020   CL 107 06/07/2020   CO2 24 06/07/2020   Lab Results  Component Value Date   ALT 23 06/07/2020   AST 27 06/07/2020   ALKPHOS 51 06/07/2020   BILITOT 0.6 06/07/2020      RADIOGRAPHY: DG Chest Port 1 View  Result Date: 05/20/2020 CLINICAL DATA:  Initial evaluation for acute shortness of breath.  EXAM: PORTABLE CHEST 1 VIEW COMPARISON:  Prior radiograph from 03/23/2020. FINDINGS: Right-sided Port-A-Cath in place with tip overlying the cavoatrial junction. Mild cardiomegaly, stable. Mediastinal silhouette within normal limits. Lungs mildly hypoinflated. Diffuse vascular and interstitial prominence compatible with pulmonary interstitial edema. Possible trace layering fluid noted along the right minor fissure. Blunting of the right costophrenic angle also suggestive of a small pleural effusion. No consolidative airspace disease. No pneumothorax. No acute osseous finding. IMPRESSION: Cardiomegaly with mild diffuse pulmonary interstitial edema and small right pleural effusion, suggesting CHF. Electronically Signed   By: Jeannine Boga M.D.   On: 05/20/2020 02:15   ECHOCARDIOGRAM LIMITED  Result Date: 05/20/2020    ECHOCARDIOGRAM LIMITED REPORT   Patient Name:   BRANDN MCGATH Date of Exam: 05/20/2020 Medical Rec #:  [id number]         Height:       69.0 in Accession #:    [id number]        Weight:       222.0 lb Date of Birth:  02-Dec-1945          BSA:          2.160 m Patient Age:    89 years          BP:           145/83 mmHg Patient Gender: M                 HR:           73 bpm. Exam Location:  Inpatient Procedure: Limited Echo, Cardiac Doppler, Color Doppler and Intracardiac            Opacification Agent Indications:    CHF-Acute diastolic  History:        Patient has prior history of Echocardiogram examinations, most                 recent 04/13/2020. CHF, Arrythmias:Atrial Fibrillation; Risk  Factors:Hypertension and Dyslipidemia.  Sonographer:    Clayton Lefort RDCS (AE) Referring Phys: 1993 [facility] G BARRETT  Sonographer Comments: Suboptimal apical window and patient is morbidly obese. IMPRESSIONS  1. Left ventricular ejection fraction, by estimation, is 55 to 60%. The left ventricle has normal function. The left ventricle has no regional wall motion abnormalities. There is moderate  left ventricular hypertrophy. Left ventricular diastolic parameters are indeterminate.  2. Right ventricular systolic function is normal. The right ventricular size is normal. There is moderately elevated pulmonary artery systolic pressure. The estimated right ventricular systolic pressure is 16.0 mmHg.  3. The mitral valve is grossly normal. Mild mitral valve regurgitation.  4. The aortic valve is tricuspid. Aortic valve regurgitation is mild. Mild to moderate aortic valve sclerosis/calcification is present, without any evidence of aortic stenosis.  5. Aortic dilatation noted. 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  6. The inferior vena cava is normal in size with <50% respiratory variability, suggesting right atrial pressure of 8 mmHg. Comparison(s): A prior study was performed on 04/13/20. No significant change from prior study. Prior images reviewed side by side. FINDINGS  Left Ventricle: Left ventricular ejection fraction, by estimation, is 55 to 60%. The left ventricle has normal function. The left ventricle has no regional wall motion abnormalities. Definity contrast agent was given IV to delineate the left ventricular  endocardial borders. There is moderate left ventricular hypertrophy. Right Ventricle: The right ventricular size is normal. Right ventricular systolic function is normal. There is moderately elevated pulmonary artery systolic pressure. The tricuspid regurgitant velocity is 3.16 m/s, and with an assumed right atrial pressure of 10 mmHg, the estimated right ventricular systolic pressure is 73.7 mmHg. Left Atrium: Left atrial size was not well visualized. Right Atrium: Right atrial size was not well visualized. Pericardium: There is no evidence of pericardial effusion. Mitral Valve: The mitral valve is grossly normal. Mild mitral valve regurgitation. Tricuspid Valve: The tricuspid valve is not well visualized. Tricuspid valve regurgitation is mild. Aortic Valve: The aortic  valve is tricuspid. Aortic valve regurgitation is mild. Mild to moderate aortic valve sclerosis/calcification is present, without any evidence of aortic stenosis. There is moderate calcification of the aortic valve. Pulmonic Valve: The pulmonic valve was not well visualized. Pulmonic valve regurgitation is trivial. Aorta: Aortic dilatation noted. 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 Venous: The inferior vena cava is normal in size with less than 50% respiratory variability, suggesting right atrial pressure of 8 mmHg. IAS/Shunts: The interatrial septum was not assessed.  LEFT VENTRICLE PLAX 2D LVIDd:         4.40 cm LVIDs:         2.70 cm LV PW:         1.50 cm LV IVS:        1.50 cm LVOT diam:     2.00 cm LVOT Area:     3.14 cm  IVC IVC diam: 2.00 cm LEFT ATRIUM         Index LA diam:    4.90 cm 2.27 cm/m   AORTA Ao Root diam: 4.10 cm Ao Asc diam:  3.80 cm TRICUSPID VALVE TR Peak grad:   39.9 mmHg TR Vmax:        316.00 cm/s  SHUNTS Systemic Diam: 2.00 cm Cherlynn Kaiser MD Electronically signed by Cherlynn Kaiser MD Signature Date/Time: 05/20/2020/5:34:43 PM    Final        IMPRESSION/PLAN: 1. Unresectable [id number] Intrahepatic Cholangiocarcinoma.  Dr. Lisbeth Renshaw discusses the pathology findings and reviews the nature of cholangiocarcinoma. He outlines the rational to consider stereotactic body radiotherapy (SBRT) with the intention of trying to achieve the best option for local control. Unfortunately though the size of his tumor is still at the upper limits of size appropriate for SBRT. Dr. Lisbeth Renshaw would like to discuss his case in GI oncology conference tomorrow to review with IR and medical oncology and to see if IR think's he's a candidate for Y90 to downsize his tumor. If the tumor is >3 cm following Y90, we would offer SBRT. If it was smaller, it may be amenable to IR ablation.  We discussed the risks, benefits, short, and long term effects of radiotherapy, and the patient is interested in  proceeding in the most appropriate route once we've discussed his case.. Dr. Lisbeth Renshaw discusses the delivery and logistics of radiotherapy and anticipates a course of 5 fractions of radiotherapy if we were to proceed. Dr. Lisbeth Renshaw discussed the need for fiducial marker placement and IV contrast with simulation prior to therapy as well. We will ask IR if fiducial markers could be placed at the time of Y90.   In a visit lasting 60 minutes, greater than 50% of the time was spent face to face discussing the patient's condition, in preparation for the discussion, and coordinating the patient's care.    The above documentation reflects my direct findings during this shared patient visit. Please see the separate note by Dr. Lisbeth Renshaw on this date for the remainder of the patient's plan of care.    Carola Rhine, PAC

## 2020-06-09 ENCOUNTER — Telehealth: Payer: Self-pay | Admitting: Hematology

## 2020-06-09 ENCOUNTER — Other Ambulatory Visit: Payer: Self-pay | Admitting: Hematology

## 2020-06-09 ENCOUNTER — Other Ambulatory Visit: Payer: Self-pay

## 2020-06-09 DIAGNOSIS — C221 Intrahepatic bile duct carcinoma: Secondary | ICD-10-CM

## 2020-06-09 DIAGNOSIS — M109 Gout, unspecified: Secondary | ICD-10-CM | POA: Diagnosis not present

## 2020-06-09 DIAGNOSIS — L03116 Cellulitis of left lower limb: Secondary | ICD-10-CM | POA: Diagnosis not present

## 2020-06-09 NOTE — Telephone Encounter (Signed)
Case discussed in GI tumor conference this morning, he felt to be a candidate for Y-90 or radiation. Dr. Anselm Pancoast recommends an abdominal MRI for further evaluation, which I agree. Order is placed. I called pt and left him a VM about the MRI, and will further discuss Y90 vs radiation with pt on next visit.   [name]  06/09/2020

## 2020-06-14 DIAGNOSIS — D649 Anemia, unspecified: Secondary | ICD-10-CM | POA: Diagnosis not present

## 2020-06-14 DIAGNOSIS — I509 Heart failure, unspecified: Secondary | ICD-10-CM | POA: Diagnosis not present

## 2020-06-14 DIAGNOSIS — L03116 Cellulitis of left lower limb: Secondary | ICD-10-CM | POA: Diagnosis not present

## 2020-06-14 DIAGNOSIS — Z6831 Body mass index (BMI) 31.0-31.9, adult: Secondary | ICD-10-CM | POA: Diagnosis not present

## 2020-06-15 ENCOUNTER — Other Ambulatory Visit: Payer: Self-pay | Admitting: Gastroenterology

## 2020-06-15 ENCOUNTER — Ambulatory Visit: Payer: Medicare HMO

## 2020-06-16 NOTE — Progress Notes (Signed)
Brandon Sherman   [phone] [phone]) [phone]   Clinic Follow up Note   Patient Care Team: [name], PA as PCP - General (Family Medicine) Lorretta Harp, MD as PCP - Cardiology (Cardiology) [name], MD as Consulting Physician (General Surgery) [name], [name], MD as Consulting Physician (Gastroenterology) [name], MD as Consulting Physician (Hematology) Lorretta Harp, MD as Consulting Physician (Cardiology)  Date of Service:  06/21/2020  CHIEF COMPLAINT: F/u cholangiocarcinoma  SUMMARY OF ONCOLOGIC HISTORY: Oncology History Overview Note  Cancer Staging Intrahepatic cholangiocarcinoma ([place]) Staging form: Intrahepatic Bile Duct, AJCC 8th Edition - Clinical stage from 08/19/2019: Stage II (cT2, cN0, cM0) - Signed by [name], MD on 08/19/2019    Intrahepatic cholangiocarcinoma ([place])  06/28/2019 Imaging   CT AP W Contrast 06/28/19  IMPRESSION: 1. Heterogeneous hypodensity posteriorly in the right hepatic lobe and potentially extending into the caudate lobe suspicious for a mass. There is felt to be truncation of branches of the portal vein in this vicinity and some narrowing of the hepatic vein, as well as triangular-shaped regions of abnormal hypoenhancement posteriorly in the right hepatic lobe likely representing downstream vascular effects. Cannot exclude malignancy such as cholangiocarcinoma or hepatocellular carcinoma, and follow up hepatic protocol MRI with and without contrast is recommended to further characterize. 2. 4 mm right middle lobe pulmonary nodule is likely benign but may merit surveillance. 3. Cholelithiasis. 4.  Aortic Atherosclerosis (ICD10-I70.0). 5. Prostatomegaly. 6. Mild impingement at L3-4 and L4-5.   07/31/2019 Imaging   MRI Liver 07/31/19 IMPRESSION: 1. 7.3 cm in long axis mass in the right hepatic lobe spanning into the caudate lobe, high suspicion for malignancy such as hepatocellular carcinoma  or cholangiocarcinoma. Suspected effacement or occlusion of the right hepatic vein and posterior branches of the right portal vein. Two smaller tumor nodules along the posterior periphery of the dominant mass. Tissue diagnosis is recommended. 2. No findings of pathologic adenopathy or distant metastatic spread. 3. 9 mm gallstone in the gallbladder. There is mild gallbladder wall thickening which may be from nondistention, correlate clinically in assessing for cholecystitis. 4.  Aortic Atherosclerosis (ICD10-I70.0). 5. Mild diffuse hepatic steatosis.   08/11/2019 Initial Biopsy   DIAGNOSIS: 08/11/19  A. LIVER, RIGHT, BIOPSY:  - Adenocarcinoma.   08/18/2019 Imaging   CT Chest 08/18/19  IMPRESSION: 1. Multiple pulmonary nodules largest at approximately 7 mm in the right lower lobe, nonspecific but concerning given findings in the liver. 2. No signs of definitive metastatic disease, also with mildly enlarged upper abdominal lymph nodes as discussed.   Aortic Atherosclerosis (ICD10-I70.0).   08/19/2019 Initial Diagnosis   Intrahepatic cholangiocarcinoma ([place])   08/19/2019 Cancer Staging   Staging form: Intrahepatic Bile Duct, AJCC 8th Edition - Clinical stage from 08/19/2019: Stage II (cT2, cN0, cM0) - Signed by [name], MD on 08/19/2019   09/29/2019 -  Chemotherapy   Cisplatin and Gemcitabine 2 weeks on/1 week off starting 09/29/19. Cisplatin held from cycle 9 (03/23/20) due to fluid status/Afib. He is now on maintenance Gemcitabine.     10/09/2019 Imaging   CT AP IMPRESSION: 1. The dominant right hepatic lobe mass is minimally reduced in size compared to prior exams, currently measuring 6.2 by 5.3 cm, previously 6.2 by 5.6 cm. However, there is a new small hypodense lesion centrally in the right hepatic lobe which is suspicious for a new small focus of tumor. Accordingly this is an overall mixed appearance. 2. Continued hypoenhancement in the liver downstream of the tumor  likely  attributable to narrowing or occlusion of the right hepatic vein by the tumor. By virtue of its location the tumor wraps around the intrahepatic portion of the IVC. 3. 4 mm right middle lobe pulmonary nodule, stable compared to earliest available comparison of 07/18/2019. Surveillance of the patient's pulmonary nodules is recommended. 4. Other imaging findings of potential clinical significance: Coronary atherosclerosis. Cholelithiasis. Prominent stool throughout the colon favors constipation. Moderate prostatomegaly with heterogeneous enhancement of the prostate gland. Lumbar spondylosis and degenerative disc disease causing mild bilateral foraminal impingement at L3-4 and L4-5.   Aortic Atherosclerosis (ICD10-I70.0).   12/24/2019 Imaging   CT CAP W Contrast  IMPRESSION: 1. Right hepatic lobe mass and adjacent right hepatic lobe nodules appear grossly stable. No evidence of distant metastatic disease. 2. Continued stability of small pulmonary nodules. Recommend attention on follow-up. 3. Cholelithiasis. 4. Enlarged prostate. 5. Aortic atherosclerosis (ICD10-I70.0). Coronary artery calcification.   02/23/2020 Procedure   He had PAC placed on 02/23/20.    04/08/2020 Imaging   CT CAP  IMPRESSION: 1. Stable or minimally decreased size of a very ill-defined, hypodense and somewhat retractile appearing mass of the central right lobe of the liver abutting the inferior vena cava and right portal vein, measuring approximately 5.7 x 5.0 cm, previously 6.2 x 5.2 cm when measured similarly. Findings are consistent with stable or minimally improved cholangiocarcinoma. 2. Small hypodense nodules of the right lobe identified on prior examination are poorly appreciated on this single phase contrast examination although not grossly changed. Attention on follow-up. 3. There are new, moderate bilateral pleural effusions and associated atelectasis or consolidation as well as a new small pericardial  effusion, nonspecific although generally concerning and suspicious for malignant effusions. There is no directly visualized pleural nodularity. 4. Multiple small pulmonary nodules are stable.  No new nodules. 5. Coronary artery disease. Aortic Atherosclerosis (ICD10-I70.0).      CURRENT THERAPY:  Cisplatin and Gemcitabine 2 weeks on/1 week off starting 09/29/19.Cisplatin held from cycle 9(03/23/20)due to fluid status/Afib. He is now on maintenance Gemcitabine.  INTERVAL HISTORY:  Brandon Sherman is here for a follow up and treatment. He presents to the clinic with his family member. He notes he has LE edema. He has been taking Lasix. He plans to f/u with his cardiologist this month. He notes his PCP gave him antibiotics for infection of his left foot. He completed Antibiotics today. He notes his systolic BP has been more elevated recently.     REVIEW OF SYSTEMS:   Constitutional: Denies fevers, chills or abnormal weight loss Eyes: Denies blurriness of vision Ears, nose, mouth, throat, and face: Denies mucositis or sore throat Respiratory: Denies cough, dyspnea or wheezes Cardiovascular: Denies palpitation, chest discomfort (+)b/l  lower extremity swelling Gastrointestinal:  Denies nausea, heartburn or change in bowel habits Skin: Denies abnormal skin rashes (+) Skin infection of top of left foot  Lymphatics: Denies new lymphadenopathy or easy bruising Neurological:Denies numbness, tingling or new weaknesses Behavioral/Psych: Mood is stable, no new changes  All other systems were reviewed with the patient and are negative.  MEDICAL HISTORY:  Past Medical History:  Diagnosis Date  . Arthritis   . Diabetes ([place])   . GERD (gastroesophageal reflux disease)   . Hyperlipidemia   . Hypertension   . Intrahepatic cholangiocarcinoma ([place])     SURGICAL HISTORY: Past Surgical History:  Procedure Laterality Date  . APPENDECTOMY  1980  . CARDIOVERSION N/A 04/27/2020   Procedure:  CARDIOVERSION;  Surgeon: Dorothy Spark, MD;  Location: [facility];  Service: Cardiovascular;  Laterality: N/A;  . CARDIOVERSION N/A 05/19/2020   Procedure: CARDIOVERSION;  Surgeon: Elouise Munroe, MD;  [facility] ENDOSCOPY;  Service: Cardiovascular;  Laterality: N/A;  . COLONOSCOPY    . IR IMAGING GUIDED PORT INSERTION  02/23/2020    I have reviewed the social history and family history with the patient and they are unchanged from previous note.  ALLERGIES:  has No Known Allergies.  MEDICATIONS:  Current Outpatient Medications  Medication Sig Dispense Refill  . allopurinol (ZYLOPRIM) 100 MG tablet Take 100 mg by mouth daily.    Marland Kitchen apixaban (ELIQUIS) 5 MG TABS tablet Take 1 tablet (5 mg total) by mouth 2 (two) times daily. 180 tablet 3  . colchicine 0.6 MG tablet Take 1 tablet (0.6 mg total) by mouth daily as needed (gout flare). Take daily for 3 days, then as needed for gout flare 30 tablet 0  . diltiazem (CARDIZEM CD) 180 MG 24 hr capsule Take 1 capsule (180 mg total) by mouth daily. 90 capsule 3  . fenofibrate (TRICOR) 145 MG tablet Take 145 mg by mouth daily.    . finasteride (PROSCAR) 5 MG tablet Take 5 mg by mouth daily.    . furosemide (LASIX) 40 MG tablet Take 1 tablet (40 mg total) by mouth daily. 30 tablet 3  . hydrALAZINE (APRESOLINE) 50 MG tablet Take 1 tablet (50 mg total) by mouth in the morning and at bedtime. 60 tablet 3  . lidocaine-prilocaine (EMLA) cream Apply 1 application topically as needed. 30 g 3  . magnesium oxide (MAG-OX) 400 MG tablet Take 1 tablet (400 mg total) by mouth daily. 30 tablet 0  . metFORMIN (GLUCOPHAGE) 500 MG tablet Take 500 mg by mouth 2 (two) times daily with a meal.     . ondansetron (ZOFRAN) 8 MG tablet Take 1 tablet (8 mg total) by mouth 2 (two) times daily as needed. Start on the third day after chemotherapy. 30 tablet 1  . pantoprazole (PROTONIX) 40 MG tablet Take 1 tablet (40 mg total) by mouth 2 (two) times daily. 180 tablet 0  .  pravastatin (PRAVACHOL) 40 MG tablet Take 40 mg by mouth daily.    . sucralfate (CARAFATE) 1 g tablet Take 1 tablet (1 g total) by mouth every 6 (six) hours as needed. Please schedule a yearly follow up for further refills: [phone] 60 tablet 0  . tamsulosin (FLOMAX) 0.4 MG CAPS capsule Take 0.4 mg by mouth daily.    . Vitamin D, Ergocalciferol, (DRISDOL) 1.25 MG (50000 UT) CAPS capsule Take 50,000 Units by mouth every 7 (seven) days.     No current facility-administered medications for this visit.    PHYSICAL EXAMINATION: ECOG PERFORMANCE STATUS: 2 - Symptomatic, <50% confined to bed  Vitals:   06/21/20 0837  BP: (!) 161/83  Pulse: 99  Resp: 18  Temp: 98.6 F (37 C)  SpO2: 100%   Filed Weights   06/21/20 0837  Weight: (!) 212 lb 14.4 oz (96.6 kg)    GENERAL:alert, no distress and comfortable SKIN: skin color, texture, turgor are normal, no rashes or significant lesions (+) Skin erythema and swelling of top of left foot  EYES: normal, Conjunctiva are pink and non-injected, sclera clear  NECK: supple, thyroid normal size, non-tender, without nodularity LYMPH:  no palpable lymphadenopathy in the cervical, axillary LUNGS: clear to auscultation and percussion with normal breathing effort HEART: regular rate and no murmurs (+)B/l  lower extremity edema (+) Afib  ABDOMEN:abdomen soft, non-tender and normal bowel sounds (+) Mild hepatomegaly  Musculoskeletal:no cyanosis of digits and no clubbing  NEURO: alert & oriented x 3 with fluent speech, no focal motor/sensory deficits  LABORATORY DATA:  I have reviewed the data as listed CBC Latest Ref Rng & Units 06/21/2020 06/07/2020 05/31/2020  WBC 4.0 - 10.5 K/uL 5.7 3.8(L) 5.4  Hemoglobin 13.0 - 17.0 g/dL 9.5(L) 9.5(L) 10.4(L)  Hematocrit 39 - 52 % 28.8(L) 29.1(L) 31.9(L)  Platelets 150 - 400 K/uL 218 139(L) 268     CMP Latest Ref Rng & Units 06/21/2020 06/07/2020 05/31/2020  Glucose 70 - 99 mg/dL 98 103(H) 102(H)  BUN 8 - 23 mg/dL 19  21 27(H)  Creatinine 0.61 - 1.24 mg/dL 1.45(H) 1.38(H) 1.36(H)  Sodium 135 - 145 mmol/L 140 141 142  Potassium 3.5 - 5.1 mmol/L 3.9 3.6 3.9  Chloride 98 - 111 mmol/L 107 107 107  CO2 22 - 32 mmol/L 24 24 26   Calcium 8.9 - 10.3 mg/dL 9.6 9.3 10.2  Total Protein 6.5 - 8.1 g/dL 6.2(L) 6.4(L) 6.4(L)  Total Bilirubin 0.3 - 1.2 mg/dL 0.6 0.6 0.5  Alkaline Phos 38 - 126 U/L 40 51 42  AST 15 - 41 U/L 20 27 19   ALT 0 - 44 U/L 13 23 14       RADIOGRAPHIC STUDIES: I have personally reviewed the radiological images as listed and agreed with the findings in the report. No results found.   ASSESSMENT & PLAN:  Brandon Sherman is a 74 y.o. male with    1.Intrahepatic cholangiocarcinoma, cT2N0Mx,unresectable,with indeterminate lung nodules -He was diagnosed in 07/2019.CT scans and MRI liver showa large7.3cm mass in the right hepatic lobe whichabutsportal vein.  -He was seen byour local surgeon Dr. Barry Dienes andDr Carlis [facility] concluded that cancer is not resectable due to the invasion to portal vein. -I discussed given his cancer is non-resectable his cancer is likely not curable but still treatable. Istarted him onstandardfirst line chemo with IV Cisplatin and Gemcitabine 2 weeks on/1 weekoff beginning 09/29/19.Cisplatin was held since C9 (03/23/20) due to fluid status/AFib. He is now on maintenance Gemcitabine alone. -His FOresults showed MSI stable disease, IDH 1 mutation,so he may benefit from IDH inhibitor in the future. -He was recently hospitalized in late 04/2020 for acute CHF and underwent another cardioversion. Chemo was held since 05/10/20 for this. He has recovered very well and we restarted chemo on 05/31/20.  -I reviewed his case in recent GI Tumor Board. We discussed radiation or IR embolization for disease control . He has met radiation oncologist Dr. Lisbeth Renshaw. Due to the large size of tumor, Dr. Lisbeth Renshaw feels it likely would be advantageous to shrink the tumor by  embolization. I will refer him to IR for consult.  -I will obtain an abdominal MRI before liver targeted therapy, and repeating CT chest for restaging -In interim, will continue chemotherapy. Labs reviewed, Hg 9.5. Overall adequate to proceed with C13D1 Gemcitabine today. Proceed with D8 next week.  -F/u in 3 weeks with scan results.    2. Nausea, Low food intakeand weight loss, Epigastric pain -With pain from GERD and nausea he initially lost 30 pounds. -His nausea and GERD is based on what he eats. Controlled onPhenergan, Sucralfate and Pepcid. -Hewaspreviouslyseen by Dietician.  -For his mild constipation he can continue OTC stool softeners.He has Zofran for nausea. -With single agent Gemcitabine he has no N&V and eating well. Weight has been fluctuating on Lasix.  3. DM, HTN, HLD, Gout, LE  edema -On Metformin, amlodipine, lisinopril., allopurinol.Continue to f/u with his PCP and cardiologist.  -He does have LE edema which he attributes to Gout. Ipreviouslyaddedlow dose IV lasix to infusions given crackling of lungs on 3/1/21exam. -He had cardioversion on 05/19/20 due to acute CHF -He has recurrent LE edema bilaterally which lead to recent left foot infection. He recently completed antibiotics. His infection has improved on exam today (06/21/20).  -He is currently on Lasix BID. He will f/u with his Cardiologist about this. I encouraged him to elevated feet at home and wear compression socks.   4. Recent onset AF, Recent CHF -With PAC placement on 02/23/20 he was seen to have Afib. Based on [place].He is at moderate risk for stroke. -He is now being seen by Cardiologist, on Eliquis, Lasix, Toprolol. -His CT scan from 04/08/20 shows b/l pleural effusion.  -HeunderwentCardioversion with Dr. Meda Coffee on 04/27/20. After recent CHF in late 04/2020 he had another cardioversion on 05/19/20. He has recovered well.  -He continues to be in Afib on exam (06/21/20)  5. Nicotine  Use -He never smoked but has been chewing Tobacco for the past 60 years.He no longer drinks alcohol. -He now chews 1-2 times a day.Iagainencouraged him toreduce and quit completely.  6.GERDand gastritis, history ofesophageal candidiasis -Hehad repeated EGDwith Dr. Havery Moros in 07/2019. 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  -Will monitor andmayrepeat EGDin future   PLAN: -Labs reviewed and adequate to proceed with C13D1Gemcitabinetoday -Lab, flushandchemo Gemcitabine in 1, 3, 4 weeks -F/u in 3 weeks with CT Chest and MRI abdomen  A few days before.  -Refer to IR to consult for embolization therapy     No problem-specific Assessment & Plan notes found for this encounter.   Orders Placed This Encounter  Procedures  . CT Chest Wo Contrast    Standing Status:   Future    Standing Expiration Date:   06/21/2021    Order Specific Question:   Preferred imaging location?    Answer:   [facility]    Order Specific Question:   Radiology Contrast Protocol - do NOT remove file path    Answer:   \\[email or web address]  . MR Abdomen W Wo Contrast    Standing Status:   Future    Standing Expiration Date:   06/21/2021    Order Specific Question:   If indicated for the ordered procedure, I authorize the administration of contrast media per Radiology protocol    Answer:   Yes    Order Specific Question:   What is the patient's sedation requirement?    Answer:   No Sedation    Order Specific Question:   Does the patient have a pacemaker or implanted devices?    Answer:   No    Order Specific Question:   Radiology Contrast Protocol - do NOT remove file path    Answer:   \\[email or web address]    Order Specific Question:   Preferred imaging location?    Answer:   [facility] (table limit - 550 lbs)    Order Specific  Question:   Release to patient    Answer:   Immediate  . IR Radiologist Eval & Mgmt    Standing Status:   Future    Standing Expiration Date:   06/21/2021    Order Specific Question:   Reason for Exam (SYMPTOM  OR DIAGNOSIS REQUIRED)    Answer:   liver targeted therapy  for colangiocarcinoma, case discussed in tumor board wth Dr. Anselm Pancoast    Order Specific Question:   Preferred Imaging Location?    Answer:   [facility]   All questions were answered. The patient knows to call the clinic with any problems, questions or concerns. No barriers to learning was detected. The total time spent in the appointment was 30 minutes.     [name], MD 06/21/2020   I, Joslyn Devon, am acting as scribe for [name], MD.   I have reviewed the above documentation for accuracy and completeness, and I agree with the above.

## 2020-06-17 DIAGNOSIS — D649 Anemia, unspecified: Secondary | ICD-10-CM | POA: Diagnosis not present

## 2020-06-17 DIAGNOSIS — I509 Heart failure, unspecified: Secondary | ICD-10-CM | POA: Diagnosis not present

## 2020-06-18 DIAGNOSIS — L03116 Cellulitis of left lower limb: Secondary | ICD-10-CM | POA: Diagnosis not present

## 2020-06-18 DIAGNOSIS — I509 Heart failure, unspecified: Secondary | ICD-10-CM | POA: Diagnosis not present

## 2020-06-21 ENCOUNTER — Inpatient Hospital Stay: Payer: Medicare HMO

## 2020-06-21 ENCOUNTER — Encounter: Payer: Self-pay | Admitting: Hematology

## 2020-06-21 ENCOUNTER — Other Ambulatory Visit: Payer: Self-pay

## 2020-06-21 ENCOUNTER — Inpatient Hospital Stay (HOSPITAL_BASED_OUTPATIENT_CLINIC_OR_DEPARTMENT_OTHER): Payer: Medicare HMO | Admitting: Hematology

## 2020-06-21 ENCOUNTER — Inpatient Hospital Stay: Payer: Medicare HMO | Attending: Hematology

## 2020-06-21 VITALS — BP 161/83 | HR 99 | Temp 98.6°F | Resp 18 | Ht 69.0 in | Wt 212.9 lb

## 2020-06-21 DIAGNOSIS — Z5111 Encounter for antineoplastic chemotherapy: Secondary | ICD-10-CM | POA: Insufficient documentation

## 2020-06-21 DIAGNOSIS — Z7189 Other specified counseling: Secondary | ICD-10-CM

## 2020-06-21 DIAGNOSIS — C221 Intrahepatic bile duct carcinoma: Secondary | ICD-10-CM

## 2020-06-21 DIAGNOSIS — Z95828 Presence of other vascular implants and grafts: Secondary | ICD-10-CM

## 2020-06-21 LAB — CBC WITH DIFFERENTIAL (CANCER CENTER ONLY)
Abs Immature Granulocytes: 0.02 10*3/uL (ref 0.00–0.07)
Basophils Absolute: 0 10*3/uL (ref 0.0–0.1)
Basophils Relative: 1 %
Eosinophils Absolute: 0.1 10*3/uL (ref 0.0–0.5)
Eosinophils Relative: 2 %
HCT: 28.8 % — ABNORMAL LOW (ref 39.0–52.0)
Hemoglobin: 9.5 g/dL — ABNORMAL LOW (ref 13.0–17.0)
Immature Granulocytes: 0 %
Lymphocytes Relative: 16 %
Lymphs Abs: 0.9 10*3/uL (ref 0.7–4.0)
MCH: 36.5 pg — ABNORMAL HIGH (ref 26.0–34.0)
MCHC: 33 g/dL (ref 30.0–36.0)
MCV: 110.8 fL — ABNORMAL HIGH (ref 80.0–100.0)
Monocytes Absolute: 0.8 10*3/uL (ref 0.1–1.0)
Monocytes Relative: 14 %
Neutro Abs: 3.8 10*3/uL (ref 1.7–7.7)
Neutrophils Relative %: 67 %
Platelet Count: 218 10*3/uL (ref 150–400)
RBC: 2.6 MIL/uL — ABNORMAL LOW (ref 4.22–5.81)
RDW: 17.7 % — ABNORMAL HIGH (ref 11.5–15.5)
WBC Count: 5.7 10*3/uL (ref 4.0–10.5)
nRBC: 0 % (ref 0.0–0.2)

## 2020-06-21 LAB — CMP (CANCER CENTER ONLY)
ALT: 13 U/L (ref 0–44)
AST: 20 U/L (ref 15–41)
Albumin: 3.5 g/dL (ref 3.5–5.0)
Alkaline Phosphatase: 40 U/L (ref 38–126)
Anion gap: 9 (ref 5–15)
BUN: 19 mg/dL (ref 8–23)
CO2: 24 mmol/L (ref 22–32)
Calcium: 9.6 mg/dL (ref 8.9–10.3)
Chloride: 107 mmol/L (ref 98–111)
Creatinine: 1.45 mg/dL — ABNORMAL HIGH (ref 0.61–1.24)
GFR, Est AFR Am: 55 mL/min — ABNORMAL LOW (ref >60)
GFR, Estimated: 48 mL/min — ABNORMAL LOW (ref >60)
Glucose, Bld: 98 mg/dL (ref 70–99)
Potassium: 3.9 mmol/L (ref 3.5–5.1)
Sodium: 140 mmol/L (ref 135–145)
Total Bilirubin: 0.6 mg/dL (ref 0.3–1.2)
Total Protein: 6.2 g/dL — ABNORMAL LOW (ref 6.5–8.1)

## 2020-06-21 MED ORDER — SODIUM CHLORIDE 0.9% FLUSH
10.0000 mL | INTRAVENOUS | Status: DC | PRN
  Administered 2020-06-21: 10 mL
  Filled 2020-06-21: qty 10

## 2020-06-21 MED ORDER — PROCHLORPERAZINE MALEATE 10 MG PO TABS
10.0000 mg | ORAL_TABLET | Freq: Once | ORAL | Status: AC
  Administered 2020-06-21: 10 mg via ORAL

## 2020-06-21 MED ORDER — SODIUM CHLORIDE 0.9% FLUSH
10.0000 mL | Freq: Once | INTRAVENOUS | Status: AC
  Administered 2020-06-21: 10 mL
  Filled 2020-06-21: qty 10

## 2020-06-21 MED ORDER — SODIUM CHLORIDE 0.9 % IV SOLN
1000.0000 mg/m2 | Freq: Once | INTRAVENOUS | Status: AC
  Administered 2020-06-21: 2166 mg via INTRAVENOUS
  Filled 2020-06-21: qty 56.97

## 2020-06-21 MED ORDER — PROCHLORPERAZINE MALEATE 10 MG PO TABS
ORAL_TABLET | ORAL | Status: AC
  Filled 2020-06-21: qty 1

## 2020-06-21 MED ORDER — SODIUM CHLORIDE 0.9 % IV SOLN
Freq: Once | INTRAVENOUS | Status: AC
  Filled 2020-06-21: qty 250

## 2020-06-21 MED ORDER — HEPARIN SOD (PORK) LOCK FLUSH 100 UNIT/ML IV SOLN
500.0000 [IU] | Freq: Once | INTRAVENOUS | Status: AC | PRN
  Administered 2020-06-21: 500 [IU]
  Filled 2020-06-21: qty 5

## 2020-06-21 NOTE — Patient Instructions (Signed)
[facility] Discharge Instructions for Patients Receiving Chemotherapy  Today you received the following chemotherapy agents Gemcitabine (GEMZAR).  To help prevent nausea and vomiting after your treatment, we encourage you to take your nausea medication as prescribed.   If you develop nausea and vomiting that is not controlled by your nausea medication, call the clinic.   BELOW ARE SYMPTOMS THAT SHOULD BE REPORTED IMMEDIATELY:  *FEVER GREATER THAN 100.5 F  *CHILLS WITH OR WITHOUT FEVER  NAUSEA AND VOMITING THAT IS NOT CONTROLLED WITH YOUR NAUSEA MEDICATION  *UNUSUAL SHORTNESS OF BREATH  *UNUSUAL BRUISING OR BLEEDING  TENDERNESS IN MOUTH AND THROAT WITH OR WITHOUT PRESENCE OF ULCERS  *URINARY PROBLEMS  *BOWEL PROBLEMS  UNUSUAL RASH Items with * indicate a potential emergency and should be followed up as soon as possible.  Feel free to call the clinic should you have any questions or concerns. The clinic phone number is [phone].  Please show the CHEMO ALERT CARD at check-in to the Emergency Department and triage nurse.   

## 2020-06-21 NOTE — Patient Instructions (Signed)

## 2020-06-23 ENCOUNTER — Encounter: Payer: Self-pay | Admitting: Cardiology

## 2020-06-23 ENCOUNTER — Other Ambulatory Visit: Payer: Self-pay

## 2020-06-23 ENCOUNTER — Ambulatory Visit (INDEPENDENT_AMBULATORY_CARE_PROVIDER_SITE_OTHER): Payer: Medicare HMO | Admitting: Cardiology

## 2020-06-23 VITALS — BP 138/74 | HR 94 | Ht 69.0 in | Wt 212.6 lb

## 2020-06-23 DIAGNOSIS — C221 Intrahepatic bile duct carcinoma: Secondary | ICD-10-CM

## 2020-06-23 DIAGNOSIS — I10 Essential (primary) hypertension: Secondary | ICD-10-CM

## 2020-06-23 DIAGNOSIS — Z7901 Long term (current) use of anticoagulants: Secondary | ICD-10-CM

## 2020-06-23 DIAGNOSIS — I480 Paroxysmal atrial fibrillation: Secondary | ICD-10-CM | POA: Diagnosis not present

## 2020-06-23 DIAGNOSIS — N183 Chronic kidney disease, stage 3 unspecified: Secondary | ICD-10-CM | POA: Diagnosis not present

## 2020-06-23 DIAGNOSIS — I5031 Acute diastolic (congestive) heart failure: Secondary | ICD-10-CM

## 2020-06-23 DIAGNOSIS — E785 Hyperlipidemia, unspecified: Secondary | ICD-10-CM

## 2020-06-23 DIAGNOSIS — I2510 Atherosclerotic heart disease of native coronary artery without angina pectoris: Secondary | ICD-10-CM | POA: Diagnosis not present

## 2020-06-23 MED ORDER — METOPROLOL SUCCINATE ER 25 MG PO TB24
25.0000 mg | ORAL_TABLET | Freq: Every day | ORAL | 3 refills | Status: DC
Start: 2020-06-23 — End: 2021-06-17

## 2020-06-23 NOTE — Assessment & Plan Note (Signed)
Admitted 05/20/2020-05/22/2020-DC weight 206lbs. He is volume stable on exam.  His LE edema could be from Diltiazem. I suggested they increase his Lasix to 80 mg daily if his weight goes up 5 lbs in a week. We discussed low sodium diet.

## 2020-06-23 NOTE — Patient Instructions (Signed)
Medication Instructions:  TAKE- Furosemide(Lasix) 80 mg daily if weight increase to 5lbs in 1 week, then decrease to regular dose START- Metoprolol Succinate 25 mg by mouth daily  *If you need a refill on your cardiac medications before your next appointment, please call your pharmacy*   Lab Work: None Ordered   Testing/Procedures: None Ordered   Follow-Up: At [facility], you and your health needs are our priority.  As part of our continuing mission to provide you with exceptional heart care, we have created designated Provider Care Teams.  These Care Teams include your primary Cardiologist (physician) and Advanced Practice Providers (APPs -  Physician Assistants and Nurse Practitioners) who all work together to provide you with the care you need, when you need it.  We recommend signing up for the patient portal called "MyChart".  Sign up information is provided on this After Visit Summary.  MyChart is used to connect with patients for Virtual Visits (Telemedicine).  Patients are able to view lab/test results, encounter notes, upcoming appointments, etc.  Non-urgent messages can be sent to your provider as well.   To learn more about what you can do with MyChart, go to [email or web address].    Your next appointment:   2 month(s)  The format for your next appointment:   In Person  Provider:   You may see Quay Burow, MD or one of the following Advanced Practice Providers on your designated Care Team:    Kerin Ransom, PA-C  [place], [place]  Coletta Memos, [place]

## 2020-06-23 NOTE — Assessment & Plan Note (Signed)
Eliquis- CHADS VASC=4 

## 2020-06-23 NOTE — Assessment & Plan Note (Signed)
DCCV 04/27/2020- failed to hold- plan for rate control. His home VS record shows his HR consistently in the 90s.  I think he would benefit for increased rate control- add Toprol 25 mg

## 2020-06-23 NOTE — Progress Notes (Signed)
Cardiology Office Note:    Date:  06/23/2020   ID:  Brandon Sherman, DOB 1946/06/05, MRN [id number]  PCP:  [name], PA  Cardiologist:  Quay Burow, MD  Electrophysiologist:  None   Referring MD: [name], PA   CC: LE edma  History of Present Illness:    Brandon Sherman is a 74 y.o. male with a hx of persistent atrial fibrillation, CAD on CT scan with low risk Myoview, HTN, HLD, NIDDM, CRI-3, and liver cancer who presents for follow up after recent hospitalization 06/26/5642-01/19/9517 for diastolic CHF.   The patient was initially diagnosed with atrial fibrillation 02/2020 incidentally while having a Port-A-Cath placed for his chemo. He was started on Eliquis for a CHADS2VASC score of 4. He underwent DCCV on 04/27/20 but unfortunately reverted back to afib after about one week.  On 05/20/2020 he presented with edema, orthopnea, and increased weight to 222 lbs.  He was admitted and diuresed- DC weight 206 lbs.  Echo that admission showed normal LVF with mild LVH and moderate pulm HTN.  He was in and out of NSR but he was in AF at discharge.  The plan is for rate control of his AF.  He was continued on Diltiazem but his beta blocker was stopped.   Since discharge he had seen his PCP who increased his Lasix to 80 mg daily for a couple days- then backed off to 40 mg daily secondary to renal insufficiency. The patient has a log of his home weights.  They range from 202lbs to 212lbs. It appears his baseline weight based on his home scales is 204-206 lbs.  He does have some LE edema-trace to 1+.  He denies orthopnea.  He tells me when he does yard work his HR is high and he has to rest.  Log of his HR at home shows HR 90's.  Past Medical History:  Diagnosis Date  . Arthritis   . Diabetes ([place])   . GERD (gastroesophageal reflux disease)   . Hyperlipidemia   . Hypertension   . 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 APPENDECTOMY   1980  . CARDIOVERSION N/A 04/27/2020   Procedure: CARDIOVERSION;  Surgeon: Dorothy Spark, MD;  Location: [facility] ENDOSCOPY;  Service: Cardiovascular;  Laterality: N/A;  . CARDIOVERSION N/A 05/19/2020   Procedure: CARDIOVERSION;  Surgeon: Elouise Munroe, MD;  Location[facility] ENDOSCOPY;  Service: Cardiovascular;  Laterality: N/A;  . COLONOSCOPY    . IR IMAGING GUIDED PORT INSERTION  02/23/2020    Current Medications: Current Meds  Medication Sig  . allopurinol (ZYLOPRIM) 100 MG tablet Take 100 mg by mouth daily.  Marland Kitchen apixaban (ELIQUIS) 5 MG TABS tablet Take 1 tablet (5 mg total) by mouth 2 (two) times daily.  Marland Kitchen diltiazem (CARDIZEM CD) 180 MG 24 hr capsule Take 1 capsule (180 mg total) by mouth daily.  . fenofibrate (TRICOR) 145 MG tablet Take 145 mg by mouth daily.  . finasteride (PROSCAR) 5 MG tablet Take 5 mg by mouth daily.  . furosemide (LASIX) 40 MG tablet Take 1 tablet (40 mg total) by mouth daily.  . hydrALAZINE (APRESOLINE) 50 MG tablet Take 1 tablet (50 mg total) by mouth in the morning and at bedtime.  . lidocaine-prilocaine (EMLA) cream Apply 1 application topically as needed.  . magnesium oxide (MAG-OX) 400 MG tablet Take 1 tablet (400 mg total) by mouth daily.  . metFORMIN (GLUCOPHAGE) 500 MG tablet  Take 500 mg by mouth 2 (two) times daily with a meal.   . ondansetron (ZOFRAN) 8 MG tablet Take 1 tablet (8 mg total) by mouth 2 (two) times daily as needed. Start on the third day after chemotherapy.  . pantoprazole (PROTONIX) 40 MG tablet Take 1 tablet (40 mg total) by mouth 2 (two) times daily.  . pravastatin (PRAVACHOL) 40 MG tablet Take 40 mg by mouth daily.  . sucralfate (CARAFATE) 1 g tablet Take 1 tablet (1 g total) by mouth every 6 (six) hours as needed. Please schedule a yearly follow up for further refills: [phone]  . tamsulosin (FLOMAX) 0.4 MG CAPS capsule Take 0.4 mg by mouth daily.  . Vitamin D, Ergocalciferol, (DRISDOL) 1.25 MG (50000 UT) CAPS capsule Take 50,000 Units  by mouth every 7 (seven) days.     Allergies:   Patient has no known allergies.   Social History   Socioeconomic History  . Marital status: Married    Spouse name: Not on file  . Number of children: 3  . Years of education: Not on file  . Highest education level: Not on file  Occupational History  . Not on file  Tobacco Use  . Smoking status: Never Smoker  . Smokeless tobacco: Current User    Types: Chew  . Tobacco comment: for 60 years, 1-2 daily   Vaping Use  . Vaping Use: Never used  Substance and Sexual Activity  . Alcohol use: Not Currently    Comment: occasionally  . Drug use: Never  . Sexual activity: Not on file  Other Topics Concern  . Not on file  Social History Narrative  . Not on file   Social Determinants of Health   Financial Resource Strain:   . Difficulty of Paying Living Expenses:   Food Insecurity:   . Worried About Charity fundraiser in the Last Year:   . Arboriculturist in the Last Year:   Transportation Needs: No Transportation Needs  . Lack of Transportation (Medical): No  . Lack of Transportation (Non-Medical): No  Physical Activity: Insufficiently Active  . Days of Exercise per Week: 1 day  . Minutes of Exercise per Session: 50 min  Stress:   . Feeling of Stress :   Social Connections: Unknown  . Frequency of Communication with Friends and Family: More than three times a week  . Frequency of Social Gatherings with Friends and Family: More than three times a week  . Attends Religious Services: Not on file  . Active Member of Clubs or Organizations: Not on file  . Attends Archivist Meetings: Not on file  . Marital Status: Married     Family History: The patient's family history includes Heart attack in his mother; Stroke in his father. There is no history of Colon cancer, Esophageal cancer, Stomach cancer, Rectal cancer, or Colon polyps.  ROS:   Please see the history of present illness.     All other systems reviewed and  are negative.  EKGs/Labs/Other Studies Reviewed:    The following studies were reviewed today: Echo 05/20/2020- 1. Left ventricular ejection fraction, by estimation, is 55 to 60%. The  left ventricle has normal function. The left ventricle has no regional  wall motion abnormalities. There is moderate left ventricular hypertrophy.  Left ventricular diastolic  parameters are indeterminate.  2. Right ventricular systolic function is normal. The right ventricular  size is normal. There is moderately elevated pulmonary artery systolic  pressure. The estimated  right ventricular systolic pressure is 79.8 mmHg.  3. The mitral valve is grossly normal. Mild mitral valve regurgitation.  4. The aortic valve is tricuspid. Aortic valve regurgitation is mild.  Mild to moderate aortic valve sclerosis/calcification is present, without  any evidence of aortic stenosis.  5. Aortic dilatation noted. There is mild dilatation of the aortic root  measuring 41 mm.  6. The inferior vena cava is normal in size with <50% respiratory  variability, suggesting right atrial pressure of 8 mmHg.   Comparison(s): A prior study was performed on 04/13/20. No significant  change from prior study. Prior images reviewed side by side.   EKG:  EKG is ordered today.  The ekg ordered 05/20/2020 demonstrates AF with VR 90  Recent Labs: 05/20/2020: B Natriuretic Peptide 428.4; Magnesium 1.7; TSH 2.131 06/21/2020: ALT 13; BUN 19; Creatinine 1.45; Hemoglobin 9.5; Platelet Count 218; Potassium 3.9; Sodium 140  Recent Lipid Panel    Component Value Date/Time   CHOL 125 03/24/2020 1110   TRIG 95 03/24/2020 1110   HDL 40 03/24/2020 1110   CHOLHDL 3.1 03/24/2020 1110   LDLCALC 67 03/24/2020 1110    Physical Exam:    VS:  BP 138/74   Pulse 94   Ht 5\' 9"  (1.753 m)   Wt 212 lb 9.6 oz (96.4 kg)   SpO2 97%   BMI 31.40 kg/m     Wt Readings from Last 3 Encounters:  06/23/20 212 lb 9.6 oz (96.4 kg)  06/21/20 (!) 212 lb 14.4  oz (96.6 kg)  06/08/20 211 lb 6.4 oz (95.9 kg)     GEN: Overweight Caucasian male, well developed in no acute distress HEENT: Normal NECK: No JVD;  CARDIAC: irregularly irregular no murmurs, rubs, gallops RESPIRATORY:  Clear to auscultation without rales, wheezing or rhonchi  ABDOMEN: Soft, non-tender, non-distended MUSCULOSKELETAL:  1+ edema LLE, trace RLE edema; No deformity  SKIN: Warm and dry NEUROLOGIC:  Alert and oriented x 3 PSYCHIATRIC:  Normal affect   ASSESSMENT:    Acute diastolic CHF (congestive heart failure) ([place]) Admitted 05/20/2020-05/22/2020-DC weight 206lbs. He is volume stable on exam.  His LE edema could be from Diltiazem. I suggested they increase his Lasix to 80 mg daily if his weight goes up 5 lbs in a week. We discussed low sodium diet.  Atrial fibrillation ([place]) DCCV 04/27/2020- failed to hold- plan for rate control. His home VS record shows his HR consistently in the 90s.  I think he would benefit for increased rate control- add Toprol 25 mg  CRI (chronic renal insufficiency), stage 3 (moderate) GFR 48- ACE stopped July 2021 I suspect his baseline SCr is closer to 1.5  Essential hypertension Echo 05/20/2020- normal LVF, mild LVH, moderate pulm HTN B/P running a little high- we'll see if low dose beta blocker helps  Chronic anticoagulation Eliquis- CHADS VASC=4  PLAN:    Lasix increase based on weight. Add Toprol 25 mg. F/U Dr Gwenlyn Found in 2 months.    Medication Adjustments/Labs and Tests Ordered: Current medicines are reviewed at length with the patient today.  Concerns regarding medicines are outlined above.  No orders of the defined types were placed in this encounter.  Meds ordered this encounter  Medications  . metoprolol succinate (TOPROL XL) 25 MG 24 hr tablet    Sig: Take 1 tablet (25 mg total) by mouth daily.    Dispense:  90 tablet    Refill:  3    Patient Instructions  Medication Instructions:  TAKE- Furosemide(Lasix)  80 mg daily if  weight increase to 5lbs in 1 week, then decrease to regular dose START- Metoprolol Succinate 25 mg by mouth daily  *If you need a refill on your cardiac medications before your next appointment, please call your pharmacy*   Lab Work: None Ordered   Testing/Procedures: None Ordered   Follow-Up: At [facility], you and your health needs are our priority.  As part of our continuing mission to provide you with exceptional heart care, we have created designated Provider Care Teams.  These Care Teams include your primary Cardiologist (physician) and Advanced Practice Providers (APPs -  Physician Assistants and Nurse Practitioners) who all work together to provide you with the care you need, when you need it.  We recommend signing up for the patient portal called "MyChart".  Sign up information is provided on this After Visit Summary.  MyChart is used to connect with patients for Virtual Visits (Telemedicine).  Patients are able to view lab/test results, encounter notes, upcoming appointments, etc.  Non-urgent messages can be sent to your provider as well.   To learn more about what you can do with MyChart, go to [email or web address].    Your next appointment:   2 month(s)  The format for your next appointment:   In Person  Provider:   You may see Quay Burow, MD or one of the following Advanced Practice Providers on your designated Care Team:    Kerin Ransom, PA-C  [place], [place]  Coletta Memos, FNP       Signed, Kerin Ransom, [place]  06/23/2020 8:40 AM    [place]

## 2020-06-23 NOTE — Assessment & Plan Note (Signed)
GFR 48- ACE stopped July 2021 I suspect his baseline SCr is closer to 1.5

## 2020-06-23 NOTE — Assessment & Plan Note (Signed)
Echo 05/20/2020- normal LVF, mild LVH, moderate pulm HTN B/P running a little high- we'll see if low dose beta blocker helps

## 2020-06-28 ENCOUNTER — Other Ambulatory Visit: Payer: Self-pay

## 2020-06-28 ENCOUNTER — Inpatient Hospital Stay: Payer: Medicare HMO

## 2020-06-28 VITALS — BP 165/90 | HR 78 | Temp 98.3°F | Resp 18

## 2020-06-28 DIAGNOSIS — C221 Intrahepatic bile duct carcinoma: Secondary | ICD-10-CM

## 2020-06-28 DIAGNOSIS — Z7189 Other specified counseling: Secondary | ICD-10-CM

## 2020-06-28 DIAGNOSIS — Z95828 Presence of other vascular implants and grafts: Secondary | ICD-10-CM

## 2020-06-28 DIAGNOSIS — Z5111 Encounter for antineoplastic chemotherapy: Secondary | ICD-10-CM | POA: Diagnosis not present

## 2020-06-28 LAB — CBC WITH DIFFERENTIAL (CANCER CENTER ONLY)
Abs Immature Granulocytes: 0.17 10*3/uL — ABNORMAL HIGH (ref 0.00–0.07)
Basophils Absolute: 0.1 10*3/uL (ref 0.0–0.1)
Basophils Relative: 2 %
Eosinophils Absolute: 0 10*3/uL (ref 0.0–0.5)
Eosinophils Relative: 1 %
HCT: 26.5 % — ABNORMAL LOW (ref 39.0–52.0)
Hemoglobin: 8.8 g/dL — ABNORMAL LOW (ref 13.0–17.0)
Immature Granulocytes: 5 %
Lymphocytes Relative: 31 %
Lymphs Abs: 1 10*3/uL (ref 0.7–4.0)
MCH: 36.5 pg — ABNORMAL HIGH (ref 26.0–34.0)
MCHC: 33.2 g/dL (ref 30.0–36.0)
MCV: 110 fL — ABNORMAL HIGH (ref 80.0–100.0)
Monocytes Absolute: 0.8 10*3/uL (ref 0.1–1.0)
Monocytes Relative: 23 %
Neutro Abs: 1.2 10*3/uL — ABNORMAL LOW (ref 1.7–7.7)
Neutrophils Relative %: 38 %
Platelet Count: 203 10*3/uL (ref 150–400)
RBC: 2.41 MIL/uL — ABNORMAL LOW (ref 4.22–5.81)
RDW: 16.1 % — ABNORMAL HIGH (ref 11.5–15.5)
WBC Count: 3.2 10*3/uL — ABNORMAL LOW (ref 4.0–10.5)
nRBC: 3.4 % — ABNORMAL HIGH (ref 0.0–0.2)

## 2020-06-28 LAB — CMP (CANCER CENTER ONLY)
ALT: 12 U/L (ref 0–44)
AST: 22 U/L (ref 15–41)
Albumin: 3.2 g/dL — ABNORMAL LOW (ref 3.5–5.0)
Alkaline Phosphatase: 48 U/L (ref 38–126)
Anion gap: 7 (ref 5–15)
BUN: 26 mg/dL — ABNORMAL HIGH (ref 8–23)
CO2: 23 mmol/L (ref 22–32)
Calcium: 9.6 mg/dL (ref 8.9–10.3)
Chloride: 108 mmol/L (ref 98–111)
Creatinine: 1.69 mg/dL — ABNORMAL HIGH (ref 0.61–1.24)
GFR, Est AFR Am: 46 mL/min — ABNORMAL LOW (ref >60)
GFR, Estimated: 40 mL/min — ABNORMAL LOW (ref >60)
Glucose, Bld: 102 mg/dL — ABNORMAL HIGH (ref 70–99)
Potassium: 4 mmol/L (ref 3.5–5.1)
Sodium: 138 mmol/L (ref 135–145)
Total Bilirubin: 0.6 mg/dL (ref 0.3–1.2)
Total Protein: 6.2 g/dL — ABNORMAL LOW (ref 6.5–8.1)

## 2020-06-28 MED ORDER — SODIUM CHLORIDE 0.9 % IV SOLN
Freq: Once | INTRAVENOUS | Status: AC
  Filled 2020-06-28: qty 250

## 2020-06-28 MED ORDER — PROCHLORPERAZINE MALEATE 10 MG PO TABS
ORAL_TABLET | ORAL | Status: AC
  Filled 2020-06-28: qty 1

## 2020-06-28 MED ORDER — SODIUM CHLORIDE 0.9 % IV SOLN
1000.0000 mg/m2 | Freq: Once | INTRAVENOUS | Status: AC
  Administered 2020-06-28: 2166 mg via INTRAVENOUS
  Filled 2020-06-28: qty 56.97

## 2020-06-28 MED ORDER — SODIUM CHLORIDE 0.9% FLUSH
10.0000 mL | INTRAVENOUS | Status: DC | PRN
  Administered 2020-06-28: 10 mL
  Filled 2020-06-28: qty 10

## 2020-06-28 MED ORDER — SODIUM CHLORIDE 0.9% FLUSH
10.0000 mL | Freq: Once | INTRAVENOUS | Status: AC
  Administered 2020-06-28: 10 mL
  Filled 2020-06-28: qty 10

## 2020-06-28 MED ORDER — HEPARIN SOD (PORK) LOCK FLUSH 100 UNIT/ML IV SOLN
500.0000 [IU] | Freq: Once | INTRAVENOUS | Status: AC | PRN
  Administered 2020-06-28: 500 [IU]
  Filled 2020-06-28: qty 5

## 2020-06-28 MED ORDER — PROCHLORPERAZINE MALEATE 10 MG PO TABS
10.0000 mg | ORAL_TABLET | Freq: Once | ORAL | Status: AC
  Administered 2020-06-28: 10 mg via ORAL

## 2020-06-28 NOTE — Patient Instructions (Signed)
[facility] Discharge Instructions for Patients Receiving Chemotherapy  Today you received the following chemotherapy agents: gemcitabine.  To help prevent nausea and vomiting after your treatment, we encourage you to take your nausea medication as directed.   If you develop nausea and vomiting that is not controlled by your nausea medication, call the clinic.   BELOW ARE SYMPTOMS THAT SHOULD BE REPORTED IMMEDIATELY:  *FEVER GREATER THAN 100.5 F  *CHILLS WITH OR WITHOUT FEVER  NAUSEA AND VOMITING THAT IS NOT CONTROLLED WITH YOUR NAUSEA MEDICATION  *UNUSUAL SHORTNESS OF BREATH  *UNUSUAL BRUISING OR BLEEDING  TENDERNESS IN MOUTH AND THROAT WITH OR WITHOUT PRESENCE OF ULCERS  *URINARY PROBLEMS  *BOWEL PROBLEMS  UNUSUAL RASH Items with * indicate a potential emergency and should be followed up as soon as possible.  Feel free to call the clinic should you have any questions or concerns. The clinic phone number is [phone].  Please show the CHEMO ALERT CARD at check-in to the Emergency Department and triage nurse.   

## 2020-06-28 NOTE — Progress Notes (Signed)
ANC1.2 and Scr.1.69. Okay to treat per Dr. Burr Medico.

## 2020-06-29 ENCOUNTER — Ambulatory Visit: Payer: Medicare HMO | Admitting: Cardiovascular Disease

## 2020-07-06 ENCOUNTER — Ambulatory Visit (HOSPITAL_COMMUNITY)
Admission: RE | Admit: 2020-07-06 | Discharge: 2020-07-06 | Disposition: A | Discharge status: Home / Self Care | Payer: Medicare HMO | Source: Ambulatory Visit | Attending: Hematology | Admitting: Hematology

## 2020-07-06 ENCOUNTER — Encounter (HOSPITAL_COMMUNITY): Payer: Self-pay

## 2020-07-06 ENCOUNTER — Other Ambulatory Visit: Payer: Self-pay

## 2020-07-06 DIAGNOSIS — K838 Other specified diseases of biliary tract: Secondary | ICD-10-CM | POA: Diagnosis not present

## 2020-07-06 DIAGNOSIS — C221 Intrahepatic bile duct carcinoma: Secondary | ICD-10-CM | POA: Insufficient documentation

## 2020-07-06 DIAGNOSIS — I2510 Atherosclerotic heart disease of native coronary artery without angina pectoris: Secondary | ICD-10-CM | POA: Diagnosis not present

## 2020-07-06 DIAGNOSIS — J90 Pleural effusion, not elsewhere classified: Secondary | ICD-10-CM | POA: Diagnosis not present

## 2020-07-06 DIAGNOSIS — K7689 Other specified diseases of liver: Secondary | ICD-10-CM | POA: Diagnosis not present

## 2020-07-06 DIAGNOSIS — J9811 Atelectasis: Secondary | ICD-10-CM | POA: Diagnosis not present

## 2020-07-06 DIAGNOSIS — I700 Atherosclerosis of aorta: Secondary | ICD-10-CM | POA: Diagnosis not present

## 2020-07-06 DIAGNOSIS — K8020 Calculus of gallbladder without cholecystitis without obstruction: Secondary | ICD-10-CM | POA: Diagnosis not present

## 2020-07-06 IMAGING — CT CT CHEST W/O CM
2 of 4 series · 15 of 36 positions shown, 18 images · non-contrast
Comparison: CT scan [DATE]

CLINICAL DATA: History of head and neck cancer. Assess treatment
response.

EXAM:
CT CHEST WITHOUT CONTRAST
TECHNIQUE: Multidetector CT imaging of the chest was performed following the
standard protocol without IV contrast.

[Series 2: thorax · axial · 0.81mm/px · z∈[-341,-29]mm · 12 of 182 slices shown, 15 images]
[im 13/182  mediastinal]
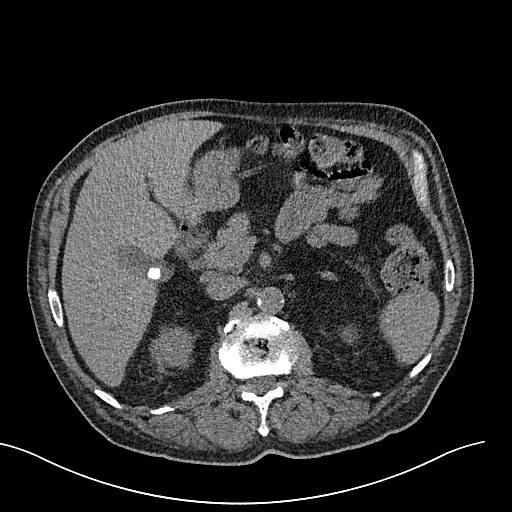
[im 13/182  lung]
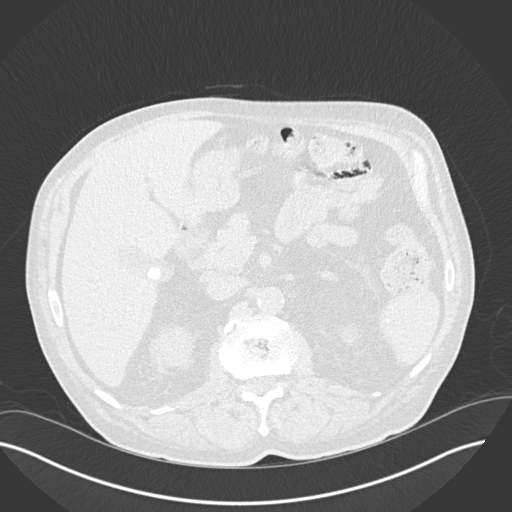
[im 26/182  lung]
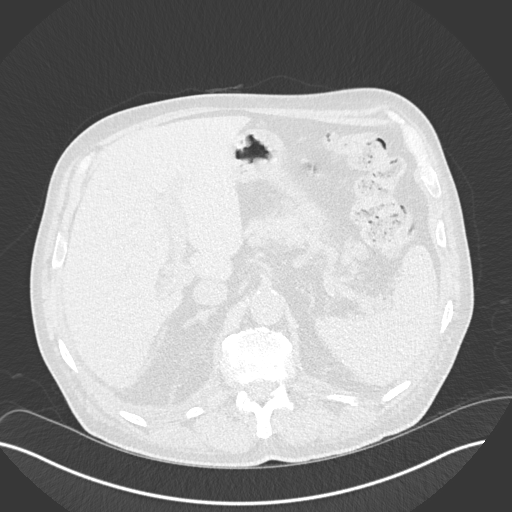
[im 39/182  lung]
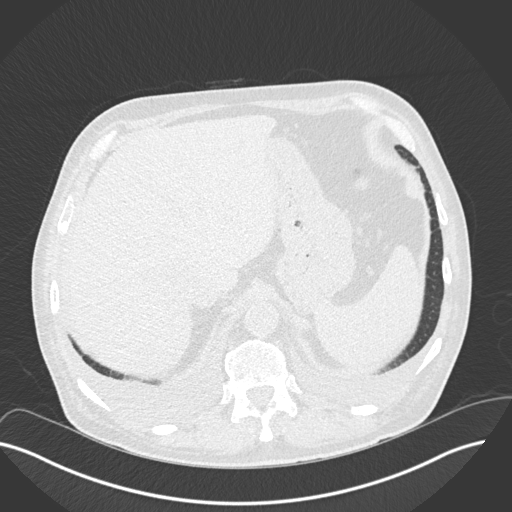
[im 52/182  lung]
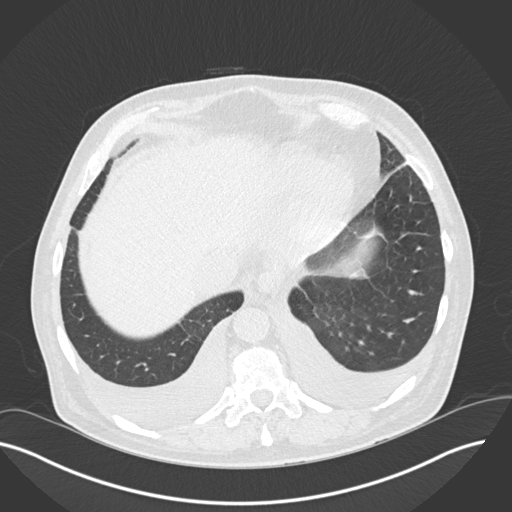
[im 65/182  mediastinal]
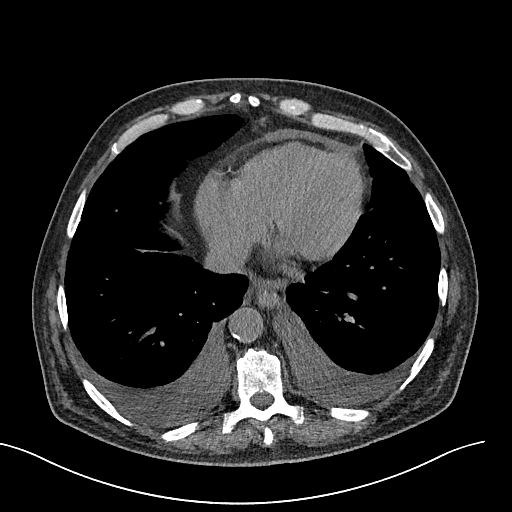
[im 65/182  lung]
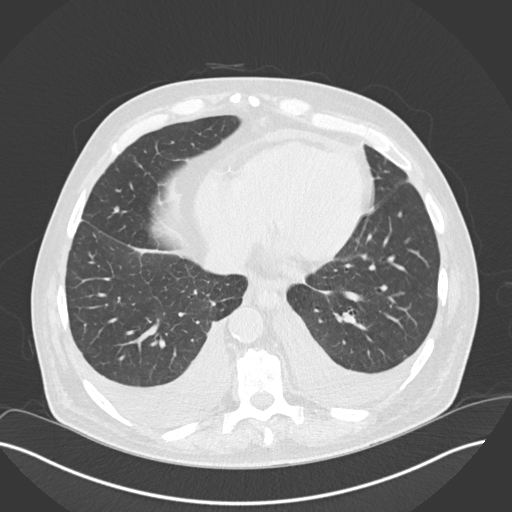
[im 78/182  lung]
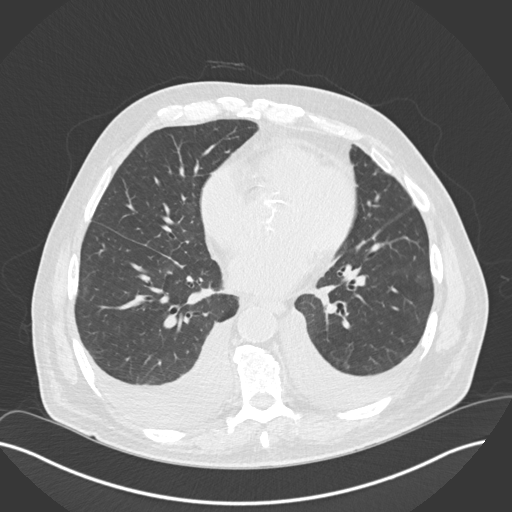
[im 104/182  lung]
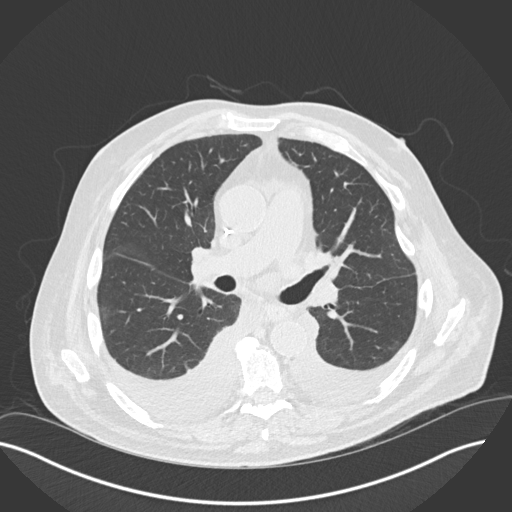
[im 117/182  lung]
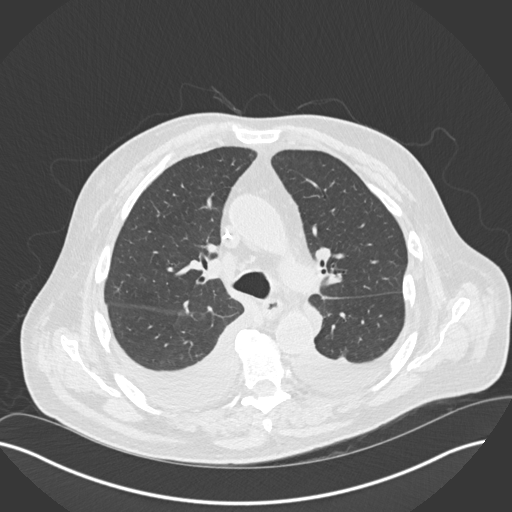
[im 130/182  mediastinal]
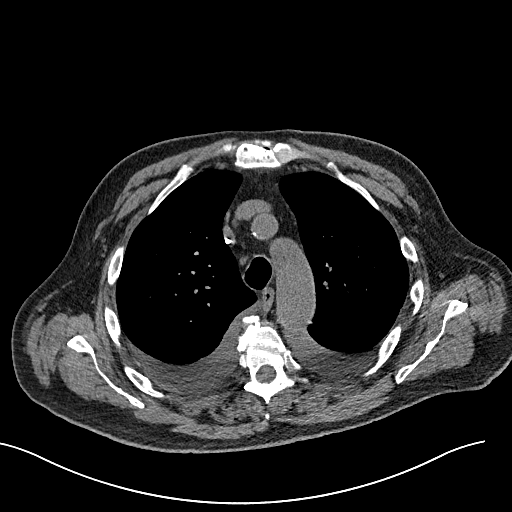
[im 130/182  lung]
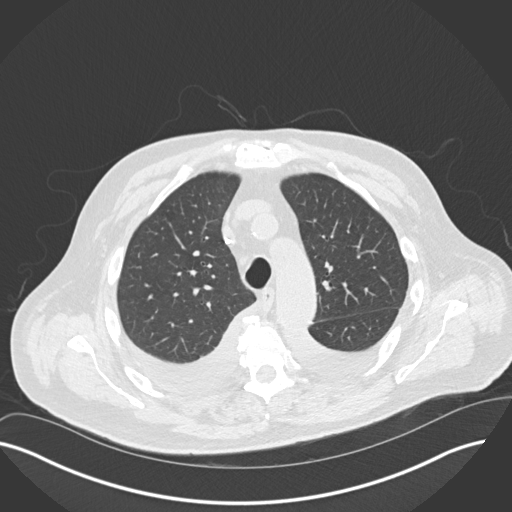
[im 143/182  lung]
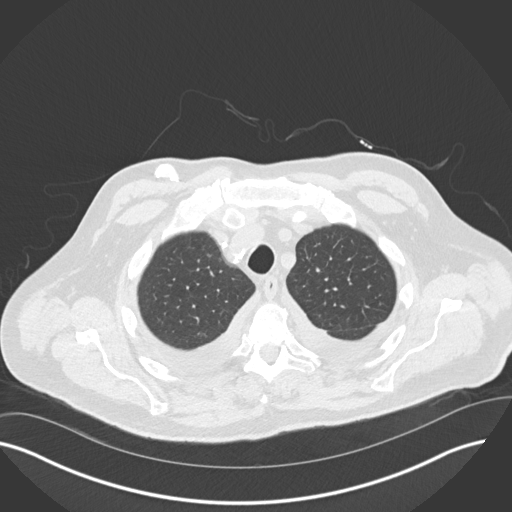
[im 156/182  lung]
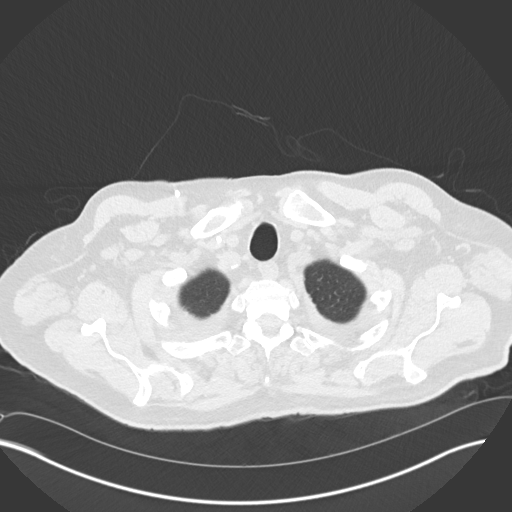
[im 169/182  lung]
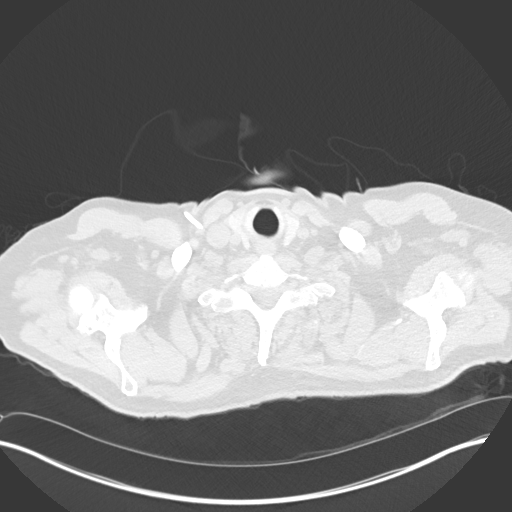

[Series 5: coronal · coronal · 0.71mm/px · 3 of 159 slices shown]
[im 32/159  lung]
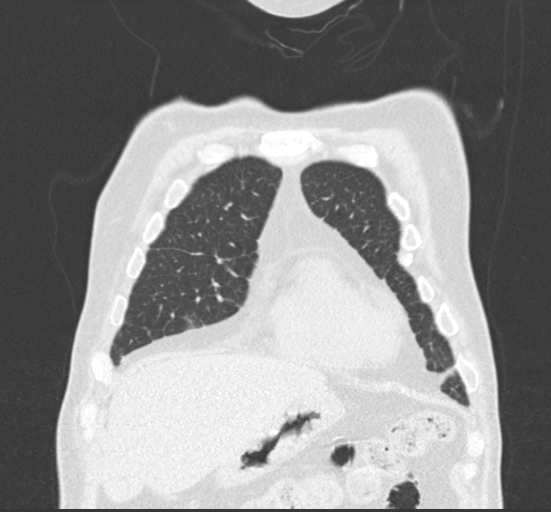
[im 64/159  lung]
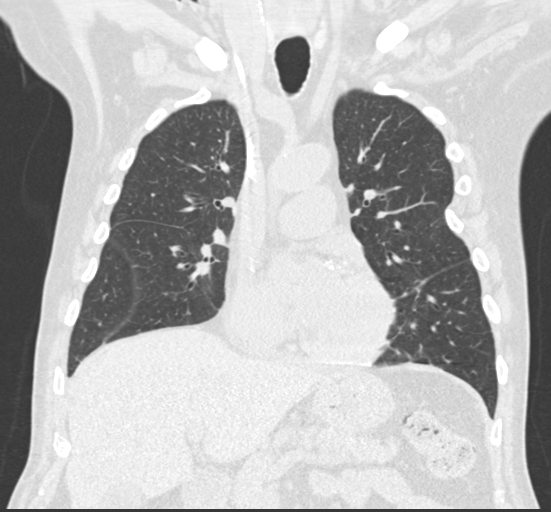
[im 95/159  lung]
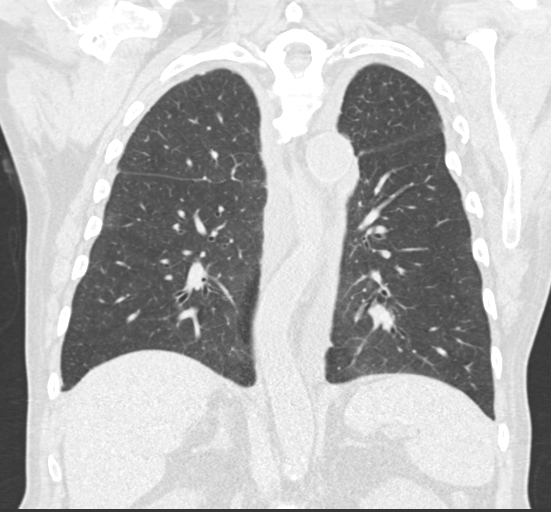

[15 of 36 positions shown; findings below may reference images not displayed]

FINDINGS: Cardiovascular: The heart is normal in size. Small stable
pericardial effusion. Stable tortuosity, mild ectasia and mild
calcification of the thoracic aorta.

Stable advanced three-vessel coronary artery calcifications.

The right power port tip is in good position in the distal SVC.

Mediastinum/Nodes: Small scattered mediastinal and hilar lymph nodes
but no mass or adenopathy. The esophagus is grossly normal.

Lungs/Pleura: Persistent/stable moderate-sized bilateral pleural
effusions.

Minimal overlying atelectasis.

Small nodules in the right lung are stable.

6 mm nodule in the right lower lobe on image number 86.

2.5 mm right middle lobe nodule on image 97/7 is stable.

3.5 mm right middle lobe nodule on image 118/7 is stable.

Stable calcified granuloma in the left lower lobe on image 92/7.

Upper Abdomen: Stable vascular calcifications.  Stable gallstones.

Musculoskeletal: No significant bony findings.
IMPRESSION: 1. Persistent/stable moderate-sized bilateral pleural effusions with
overlying atelectasis.
2. Stable small right pulmonary nodules. No new or progressive
findings. Recommend continued surveillance.
3. No mediastinal or hilar mass or adenopathy.
4. Stable advanced three-vessel coronary artery calcifications.
5. Cholelithiasis.

Aortic Atherosclerosis ([Z7]-[Z7])

## 2020-07-06 IMAGING — MR MR ABDOMEN WO/W CM
18 of 19 series · 46 of 48 positions shown · IV contrast (gadavist)
Comparison: MRI abdomen [DATE] and CT scan [DATE]

CLINICAL DATA: Follow-up cholangiocarcinoma.

EXAM:
MRI ABDOMEN WITHOUT AND WITH CONTRAST
TECHNIQUE: Multiplanar multisequence MR imaging of the abdomen was performed
both before and after the administration of intravenous contrast.
CONTRAST:  10mL GADAVIST GADOBUTROL 1 MMOL/ML IV SOLN

[Series 3: T2 · coronal · 6.0mm · 1.64mm/px · 1 of 40 slices shown (1 of 2)]
[im 1/40]
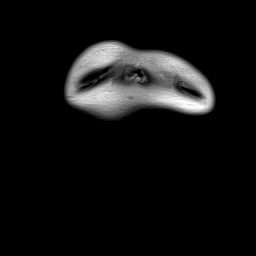

[Series 4: T2 fat-sat · axial · 6.0mm · 1.25mm/px · z∈[-245,+64]mm · 2 of 44 slices shown]
[im 1/44]
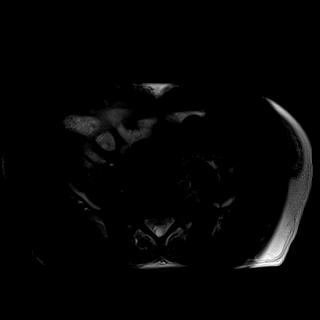
[im 44/44]
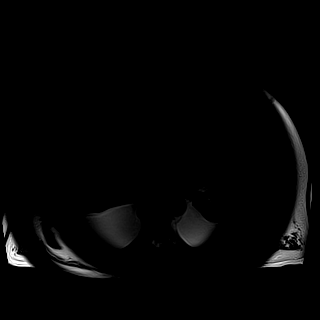

[Series 6: T1 · axial · 3.6mm · 1.31mm/px · z∈[-208,+48]mm · 3 of 72 slices shown (1 of 2)]
[im 1/72]
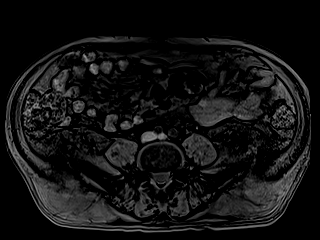
[im 36/72]
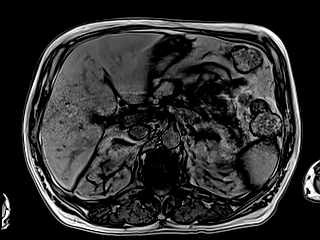
[im 72/72]
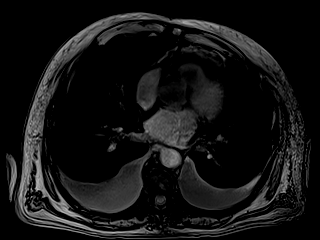

[Series 7: T1 · axial · 3.6mm · 1.31mm/px · z∈[-208,+48]mm · 3 of 72 slices shown (2 of 2)]
[im 1/72]
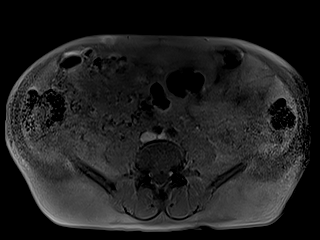
[im 36/72]
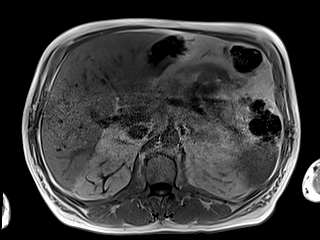
[im 72/72]
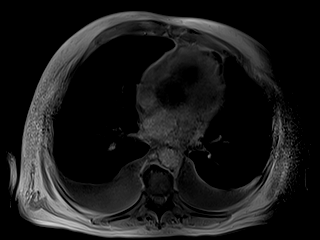

[Series 8: DWI · axial · 6.0mm · 1.57mm/px · z∈[-202,+64]mm · 3 of 76 slices shown (1 of 2)]
[im 1/76]
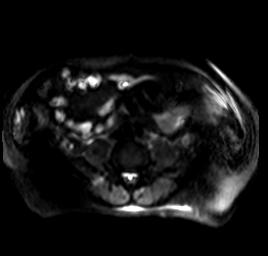
[im 38/76]
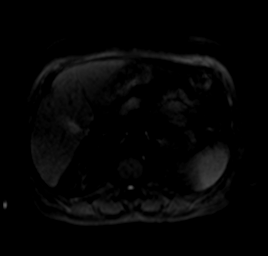
[im 76/76]
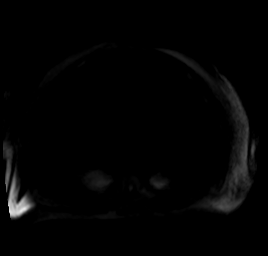

[Series 9: DWI · axial · 6.0mm · 1.57mm/px · 1 of 38 slices shown (2 of 2)]
[im 1/38]
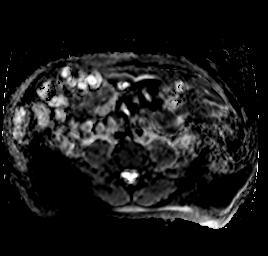

[Series 10: bSSFP · axial · 4.0mm · 0.84mm/px · z∈[-218,+42]mm · 2 of 66 slices shown]
[im 1/66]
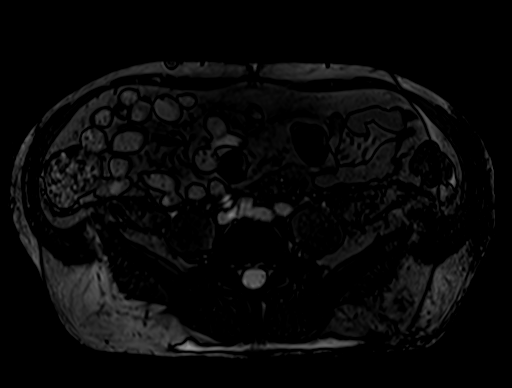
[im 66/66]
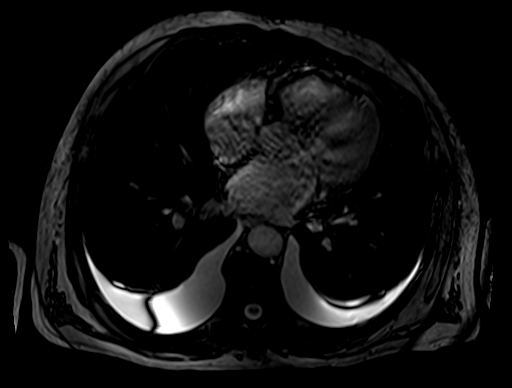

[Series 12: T1 dynamic · axial · 3.0mm · 1.31mm/px · z∈[-221,+40]mm · 3 of 88 slices shown (1 of 6)]
[im 1/88]
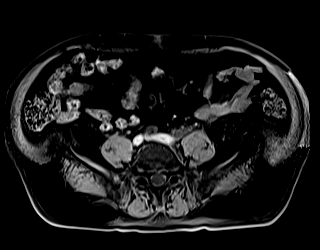
[im 44/88]
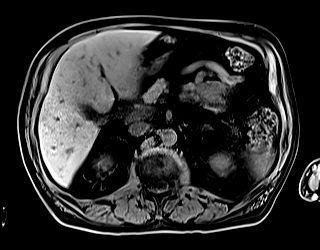
[im 88/88]
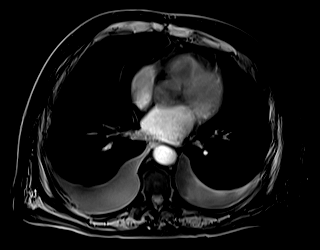

[Series 15: T1 dynamic · axial · 3.0mm · 1.31mm/px · z∈[-221,+40]mm · 3 of 88 slices shown (2 of 6)]
[im 1/88]
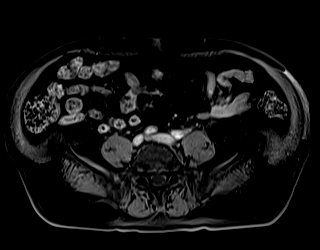
[im 44/88]
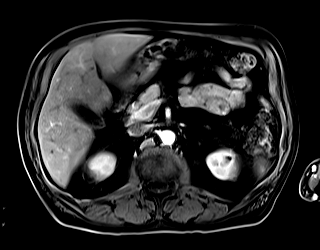
[im 88/88]
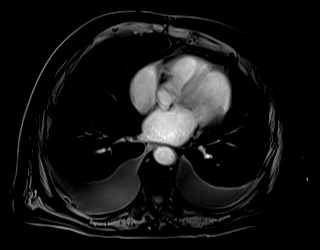

[Series 17: T1 dynamic · axial · 3.0mm · 1.31mm/px · z∈[-221,+40]mm · 3 of 88 slices shown (3 of 6)]
[im 1/88]
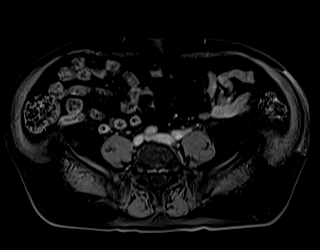
[im 44/88]
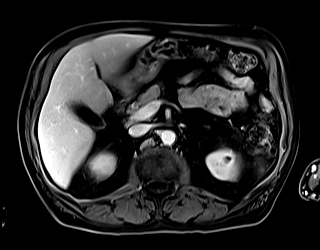
[im 88/88]
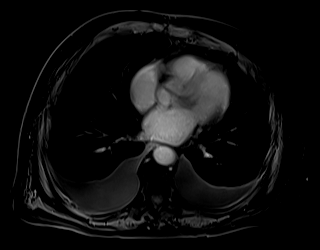

[Series 19: T1 dynamic · axial · 3.0mm · 1.31mm/px · z∈[-221,+40]mm · 3 of 88 slices shown (4 of 6)]
[im 1/88]
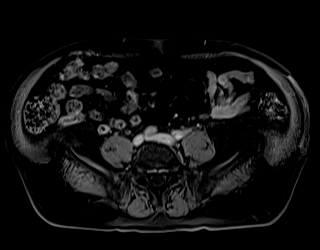
[im 44/88]
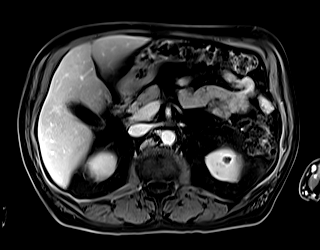
[im 88/88]
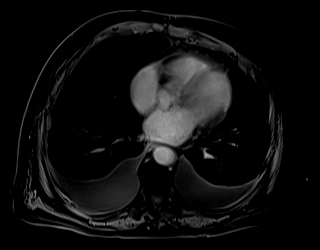

[Series 21: T1 dynamic · coronal · 5.0mm · 1.31mm/px · 3 of 72 slices shown (5 of 6)]
[im 1/72]
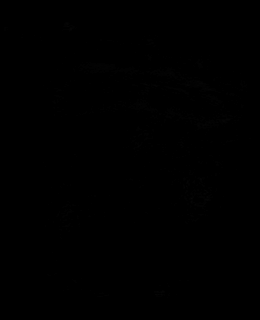
[im 36/72]
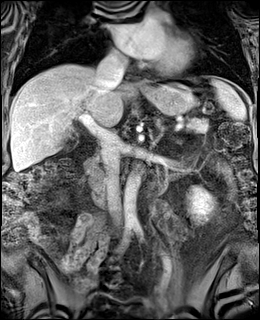
[im 72/72]
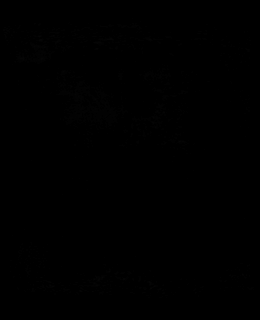

[Series 22: T2 · axial · 6.0mm · 1.56mm/px · 1 of 40 slices shown (2 of 2)]
[im 1/40]
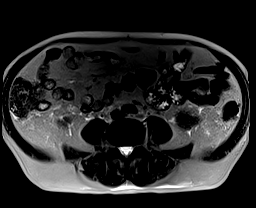

[Series 24: T1 dynamic · axial · 3.0mm · 1.31mm/px · z∈[-221,+40]mm · 3 of 88 slices shown (6 of 6)]
[im 1/88]
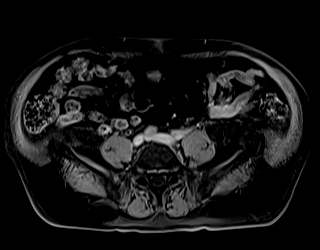
[im 44/88]
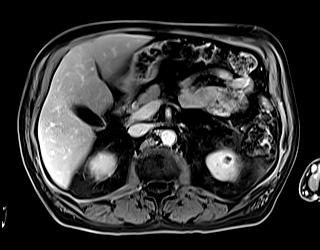
[im 88/88]
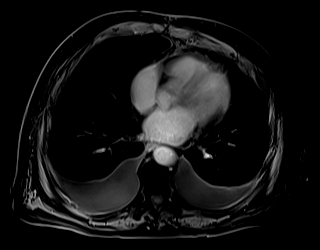

[Series 100: sub_20 sec · axial · 3.0mm · 1.31mm/px · z∈[-221,+40]mm · 3 of 88 slices shown]
[im 1/88]
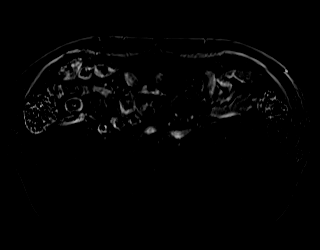
[im 44/88]
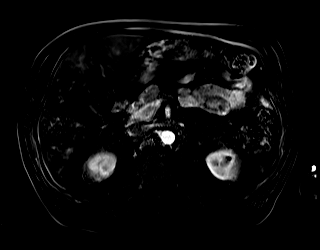
[im 88/88]
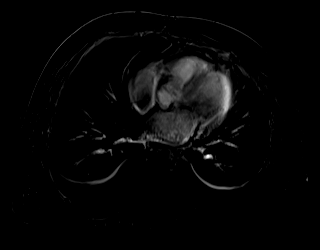

[Series 101: sub_45 sec · axial · 3.0mm · 1.31mm/px · z∈[-221,+40]mm · 3 of 88 slices shown]
[im 1/88]
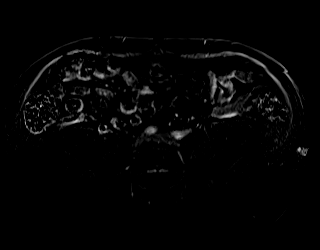
[im 44/88]
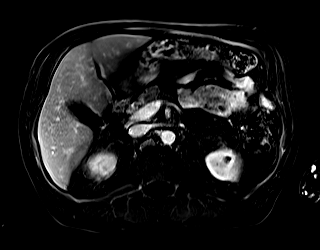
[im 88/88]
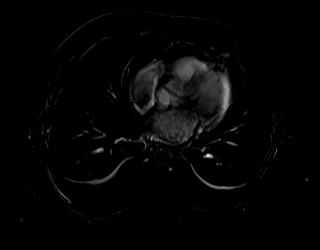

[Series 102: sub_90 sec · axial · 3.0mm · 1.31mm/px · z∈[-221,+40]mm · 3 of 88 slices shown]
[im 1/88]
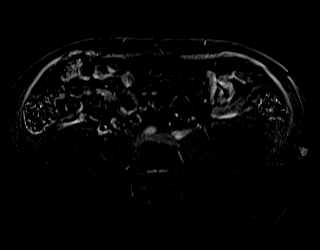
[im 44/88]
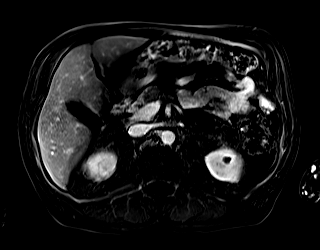
[im 88/88]
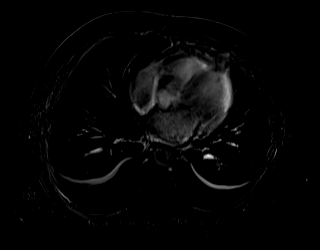

[Series 103: sub_delay · axial · 3.0mm · 1.31mm/px · z∈[-197,+40]mm · 3 of 80 slices shown]
[im 1/80]
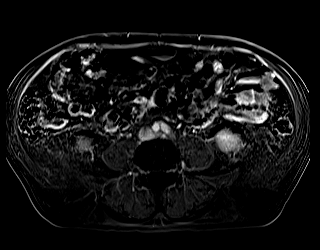
[im 40/80]
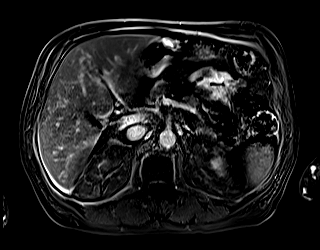
[im 80/80]
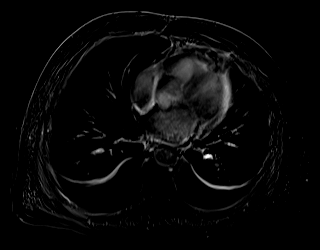

[46 of 48 positions shown; findings below may reference images not displayed]

FINDINGS: Lower chest: The lung bases are clear of an acute process. No
pulmonary lesions or pleural or pericardial effusion.

Hepatobiliary: There is a diffusion positive lesion again
demonstrated in the right hepatic lobe in the medial aspect of
segment 7. Lesion is faintly visualized on the T1 and T2 weighted
images and is most apparent on the delayed post-contrast images. The
lesion measures approximately 3.9 x 2.5 cm on image 26/17. It
previously measured 6.6 x 4.3 cm on the prior study on image 35/905.
Findings would suggest a good response to treatment with some
contraction of the tumor. There is persistent mild intrahepatic
biliary dilatation in the right hepatic lobe distal to the lesion.

No new hepatic lesions are identified.

On the delayed images some of the hepatic and portal vein radicles
have a somewhat tortuous irregular and almost beaded appearance.
Findings could be radiation related. No change since CT scan of
[DATE].

The gallbladder demonstrates small gallstones but no findings for
acute cholecystitis. Normal caliber and course of the common bile
duct.

Pancreas:  No mass, inflammation or ductal dilatation.

Spleen:  Normal size.  No focal lesions.

Adrenals/Urinary Tract: The adrenal glands and kidneys are
unremarkable. Small renal cysts are stable.

Stomach/Bowel: The stomach, duodenum, visualized small bowel and
visualized colon are unremarkable.

Vascular/Lymphatic: The aorta and branch vessels are patent.
Moderate atherosclerotic calcifications. The major venous structures
are patent. No mesenteric or retroperitoneal mass or adenopathy.

Other:  No ascites or abdominal wall hernia.

Musculoskeletal: No significant bony findings. Stable L4 hemangioma.
IMPRESSION: 1. Interval decrease in size of the right hepatic lobe lesion in the
medial aspect of the segment 7. Findings would suggest a good
response to treatment with some contraction of the tumor.
2. No new hepatic lesions. No abdominal adenopathy or metastatic
disease.
3. Stable mild intrahepatic biliary dilatation in the right hepatic
lobe distal to the lesion.
4. Somewhat tortuous and almost beaded appearance of the hepatic and
portal vein radicles. Findings could be radiation related.
5. Cholelithiasis.

## 2020-07-06 MED ORDER — GADOBUTROL 1 MMOL/ML IV SOLN
10.0000 mL | Freq: Once | INTRAVENOUS | Status: AC | PRN
  Administered 2020-07-06: 10 mL via INTRAVENOUS

## 2020-07-11 NOTE — Progress Notes (Signed)
 [facility]   [phone] [phone]   Clinic Follow up Note   Patient Care Team: Yates, Kate H, PA as PCP - General (Family Medicine) Berry, Jonathan J, MD as PCP - Cardiology (Cardiology) Byerly, Faera, MD as Consulting Physician (General Surgery) [name], Steven P, MD as Consulting Physician (Gastroenterology) Feng, Yan, MD as Consulting Physician (Hematology) Berry, Jonathan J, MD as Consulting Physician (Cardiology) 07/12/2020  CHIEF COMPLAINT: F/u cholangiocarcinoma   SUMMARY OF ONCOLOGIC HISTORY: Oncology History Overview Note  Cancer Staging Intrahepatic cholangiocarcinoma (HCC) Staging form: Intrahepatic Bile Duct, AJCC 8th Edition - Clinical stage from 08/19/2019: Stage II (cT2, cN0, cM0) - Signed by Feng, Yan, MD on 08/19/2019    Intrahepatic cholangiocarcinoma (HCC)  06/28/2019 Imaging   CT AP W Contrast 06/28/19  IMPRESSION: 1. Heterogeneous hypodensity posteriorly in the right hepatic lobe and potentially extending into the caudate lobe suspicious for a mass. There is felt to be truncation of branches of the portal vein in this vicinity and some narrowing of the hepatic vein, as well as triangular-shaped regions of abnormal hypoenhancement posteriorly in the right hepatic lobe likely representing downstream vascular effects. Cannot exclude malignancy such as cholangiocarcinoma or hepatocellular carcinoma, and follow up hepatic protocol MRI with and without contrast is recommended to further characterize. 2. 4 mm right middle lobe pulmonary nodule is likely benign but may merit surveillance. 3. Cholelithiasis. 4.  Aortic Atherosclerosis (ICD10-I70.0). 5. Prostatomegaly. 6. Mild impingement at L3-4 and L4-5.   07/31/2019 Imaging   MRI Liver 07/31/19 IMPRESSION: 1. 7.3 cm in long axis mass in the right hepatic lobe spanning into the caudate lobe, high suspicion for malignancy such as hepatocellular carcinoma or  cholangiocarcinoma. Suspected effacement or occlusion of the right hepatic vein and posterior branches of the right portal vein. Two smaller tumor nodules along the posterior periphery of the dominant mass. Tissue diagnosis is recommended. 2. No findings of pathologic adenopathy or distant metastatic spread. 3. 9 mm gallstone in the gallbladder. There is mild gallbladder wall thickening which may be from nondistention, correlate clinically in assessing for cholecystitis. 4.  Aortic Atherosclerosis (ICD10-I70.0). 5. Mild diffuse hepatic steatosis.   08/11/2019 Initial Biopsy   DIAGNOSIS: 08/11/19  A. LIVER, RIGHT, BIOPSY:  - Adenocarcinoma.   08/18/2019 Imaging   CT Chest 08/18/19  IMPRESSION: 1. Multiple pulmonary nodules largest at approximately 7 mm in the right lower lobe, nonspecific but concerning given findings in the liver. 2. No signs of definitive metastatic disease, also with mildly enlarged upper abdominal lymph nodes as discussed.   Aortic Atherosclerosis (ICD10-I70.0).   08/19/2019 Initial Diagnosis   Intrahepatic cholangiocarcinoma (HCC)   08/19/2019 Cancer Staging   Staging form: Intrahepatic Bile Duct, AJCC 8th Edition - Clinical stage from 08/19/2019: Stage II (cT2, cN0, cM0) - Signed by Feng, Yan, MD on 08/19/2019   09/29/2019 -  Chemotherapy   Cisplatin and Gemcitabine 2 weeks on/1 week off starting 09/29/19. Cisplatin held from cycle 9 (03/23/20) due to fluid status/Afib. He is now on maintenance Gemcitabine.     10/09/2019 Imaging   CT AP IMPRESSION: 1. The dominant right hepatic lobe mass is minimally reduced in size compared to prior exams, currently measuring 6.2 by 5.3 cm, previously 6.2 by 5.6 cm. However, there is a new small hypodense lesion centrally in the right hepatic lobe which is suspicious for a new small focus of tumor. Accordingly this is an overall mixed appearance. 2. Continued hypoenhancement in the liver downstream of the tumor likely  attributable to   narrowing or occlusion of the right hepatic vein by the tumor. By virtue of its location the tumor wraps around the intrahepatic portion of the IVC. 3. 4 mm right middle lobe pulmonary nodule, stable compared to earliest available comparison of 07/18/2019. Surveillance of the patient's pulmonary nodules is recommended. 4. Other imaging findings of potential clinical significance: Coronary atherosclerosis. Cholelithiasis. Prominent stool throughout the colon favors constipation. Moderate prostatomegaly with heterogeneous enhancement of the prostate gland. Lumbar spondylosis and degenerative disc disease causing mild bilateral foraminal impingement at L3-4 and L4-5.   Aortic Atherosclerosis (ICD10-I70.0).   12/24/2019 Imaging   CT CAP W Contrast  IMPRESSION: 1. Right hepatic lobe mass and adjacent right hepatic lobe nodules appear grossly stable. No evidence of distant metastatic disease. 2. Continued stability of small pulmonary nodules. Recommend attention on follow-up. 3. Cholelithiasis. 4. Enlarged prostate. 5. Aortic atherosclerosis (ICD10-I70.0). Coronary artery calcification.   02/23/2020 Procedure   He had PAC placed on 02/23/20.    04/08/2020 Imaging   CT CAP  IMPRESSION: 1. Stable or minimally decreased size of a very ill-defined, hypodense and somewhat retractile appearing mass of the central right lobe of the liver abutting the inferior vena cava and right portal vein, measuring approximately 5.7 x 5.0 cm, previously 6.2 x 5.2 cm when measured similarly. Findings are consistent with stable or minimally improved cholangiocarcinoma. 2. Small hypodense nodules of the right lobe identified on prior examination are poorly appreciated on this single phase contrast examination although not grossly changed. Attention on follow-up. 3. There are new, moderate bilateral pleural effusions and associated atelectasis or consolidation as well as a new small pericardial  effusion, nonspecific although generally concerning and suspicious for malignant effusions. There is no directly visualized pleural nodularity. 4. Multiple small pulmonary nodules are stable.  No new nodules. 5. Coronary artery disease. Aortic Atherosclerosis (ICD10-I70.0).   07/06/2020 Imaging   MRI ABDIMPRESSION: 1. Interval decrease in size of the right hepatic lobe lesion in the medial aspect of the segment 7. Findings would suggest a good response to treatment with some contraction of the tumor. 2. No new hepatic lesions. No abdominal adenopathy or metastatic disease. 3. Stable mild intrahepatic biliary dilatation in the right hepatic lobe distal to the lesion. 4. Somewhat tortuous and almost beaded appearance of the hepatic and portal vein radicles. Findings could be radiation related. 5. Cholelithiasis.  CT chest wo contrast IMPRESSION: 1. Persistent/stable moderate-sized bilateral pleural effusions with overlying atelectasis. 2. Stable small right pulmonary nodules. No new or progressive findings. Recommend continued surveillance. 3. No mediastinal or hilar mass or adenopathy. 4. Stable advanced three-vessel coronary artery calcifications. 5. Cholelithiasis. Aortic Atherosclerosis (ICD10-I70.0)     CURRENT THERAPY:  Cisplatin and Gemcitabine 2 weeks on/1 week off starting 09/29/19.Cisplatin held from cycle 9(03/23/20)due to fluid status/Afib. He is now on maintenance Gemcitabine.  INTERVAL HISTORY:Brandon Sherman returns for f/u and treatment as scheduled. He completed cycle 13 day 8 gemcitabine on 06/28/20. He had restaging CT chest and abdominal MIR on 07/06/20.  He feels well overall.  He has intermittent left greater than right leg swelling.  The left foot is a little red which is his baseline.  Denies calf pain.  Denies current gout flare.  He remains active, appetite is normal.  Denies fever, chills, cough, chest pain, dyspnea, mucositis, rash, abdominal pain, bleeding,  n/v/c/d.    MEDICAL HISTORY:  Past Medical History:  Diagnosis Date  . Arthritis   . Diabetes (HCC)   . GERD (gastroesophageal reflux disease)   .   Hyperlipidemia   . Hypertension   . Intrahepatic cholangiocarcinoma ([place])     SURGICAL HISTORY: Past Surgical History:  Procedure Laterality Date  . APPENDECTOMY  1980  . CARDIOVERSION N/A 04/27/2020   Procedure: CARDIOVERSION;  Surgeon: Dorothy Spark, MD;  Location: [facility] ENDOSCOPY;  Service: Cardiovascular;  Laterality: N/A;  . CARDIOVERSION N/A 05/19/2020   Procedure: CARDIOVERSION;  Surgeon: Elouise Munroe, MD;  Location: [facility] ENDOSCOPY;  Service: Cardiovascular;  Laterality: N/A;  . COLONOSCOPY    . IR IMAGING GUIDED PORT INSERTION  02/23/2020    I have reviewed the social history and family history with the patient and they are unchanged from previous note.  ALLERGIES:  has No Known Allergies.  MEDICATIONS:  Current Outpatient Medications  Medication Sig Dispense Refill  . allopurinol (ZYLOPRIM) 100 MG tablet Take 100 mg by mouth daily.    Marland Kitchen apixaban (ELIQUIS) 5 MG TABS tablet Take 1 tablet (5 mg total) by mouth 2 (two) times daily. 180 tablet 3  . diltiazem (CARDIZEM CD) 180 MG 24 hr capsule Take 1 capsule (180 mg total) by mouth daily. 90 capsule 3  . fenofibrate (TRICOR) 145 MG tablet Take 145 mg by mouth daily.    . finasteride (PROSCAR) 5 MG tablet Take 5 mg by mouth daily.    . furosemide (LASIX) 40 MG tablet Take 1 tablet (40 mg total) by mouth daily. 30 tablet 3  . hydrALAZINE (APRESOLINE) 50 MG tablet Take 1 tablet (50 mg total) by mouth in the morning and at bedtime. 60 tablet 3  . lidocaine-prilocaine (EMLA) cream Apply 1 application topically as needed. 30 g 3  . magnesium oxide (MAG-OX) 400 MG tablet Take 1 tablet (400 mg total) by mouth daily. 30 tablet 0  . metFORMIN (GLUCOPHAGE) 500 MG tablet Take 500 mg by mouth 2 (two) times daily with a meal.     . metoprolol succinate (TOPROL XL) 25 MG 24 hr tablet Take 1  tablet (25 mg total) by mouth daily. 90 tablet 3  . ondansetron (ZOFRAN) 8 MG tablet Take 1 tablet (8 mg total) by mouth 2 (two) times daily as needed. Start on the third day after chemotherapy. 30 tablet 1  . pantoprazole (PROTONIX) 40 MG tablet Take 1 tablet (40 mg total) by mouth 2 (two) times daily. 180 tablet 0  . pravastatin (PRAVACHOL) 40 MG tablet Take 40 mg by mouth daily.    . sucralfate (CARAFATE) 1 g tablet Take 1 tablet (1 g total) by mouth every 6 (six) hours as needed. Please schedule a yearly follow up for further refills: [phone] 60 tablet 0  . tamsulosin (FLOMAX) 0.4 MG CAPS capsule Take 0.4 mg by mouth daily.    . Vitamin D, Ergocalciferol, (DRISDOL) 1.25 MG (50000 UT) CAPS capsule Take 50,000 Units by mouth every 7 (seven) days.    . colchicine 0.6 MG tablet Take 1 tablet (0.6 mg total) by mouth daily as needed (gout flare). Take daily for 3 days, then as needed for gout flare 30 tablet 0   No current facility-administered medications for this visit.   Facility-Administered Medications Ordered in Other Visits  Medication Dose Route Frequency Provider Last Rate Last Admin  . sodium chloride flush (NS) 0.9 % injection 10 mL  10 mL Intracatheter PRN [name], MD   10 mL at 07/12/20 1059    PHYSICAL EXAMINATION: ECOG PERFORMANCE STATUS: 0 - Asymptomatic  Vitals:   07/12/20 0827  BP: (!) 160/82  Pulse: 74  Resp: 18  Temp: 98.4 F (36.9 C)  SpO2: 99%   Filed Weights   07/12/20 0827  Weight: 216 lb 9.6 oz (98.2 kg)    GENERAL:alert, no distress and comfortable SKIN: No rash to exposed skin.  Generalized ecchymoses to bilateral forearms EYES:  sclera clear LUNGS: clear with normal breathing effort.  No wheezing or rhonchi HEART: A. fib.  Mild equal bilateral lower extremity and pedal edema, mild erythema to left dorsal foot, no significant warmth.  No wound, rash or skin breakdown ABDOMEN: abdomen soft, non-tender and normal bowel sounds MSK: no joint erythema   NEURO: alert & oriented x 3 with fluent speech PAC without erythema  LABORATORY DATA:  I have reviewed the data as listed CBC Latest Ref Rng & Units 07/12/2020 06/28/2020 06/21/2020  WBC 4.0 - 10.5 K/uL 5.9 3.2(L) 5.7  Hemoglobin 13.0 - 17.0 g/dL 8.9(L) 8.8(L) 9.5(L)  Hematocrit 39 - 52 % 27.2(L) 26.5(L) 28.8(L)  Platelets 150 - 400 K/uL 224 203 218     CMP Latest Ref Rng & Units 07/12/2020 06/28/2020 06/21/2020  Glucose 70 - 99 mg/dL 97 102(H) 98  BUN 8 - 23 mg/dL 27(H) 26(H) 19  Creatinine 0.61 - 1.24 mg/dL 1.65(H) 1.69(H) 1.45(H)  Sodium 135 - 145 mmol/L 141 138 140  Potassium 3.5 - 5.1 mmol/L 3.6 4.0 3.9  Chloride 98 - 111 mmol/L 108 108 107  CO2 22 - 32 mmol/L _0 Calcium 8.9 - 10.3 mg/dL 9.9 9.6 9.6  Total Protein 6.5 - 8.1 g/dL 6.1(L) 6.2(L) 6.2(L)  Total Bilirubin 0.3 - 1.2 mg/dL 0.7 0.6 0.6  Alkaline Phos 38 - 126 U/L 43 48 40  AST 15 - 41 U/L _1 ALT 0 - 44 U/L _2 RADIOGRAPHIC STUDIES: I have personally reviewed the radiological images as listed and agreed with the findings in the report. No results found.   ASSESSMENT & PLAN: Brandon Mckim Sizemoreis a 74y.o.malewith    1.Intrahepatic cholangiocarcinoma, cT2N0Mx,unresectable,with indeterminate lung nodules -Diagnosed in9/2020.CT scans and MRI liver showa large7.3cm mass in the right hepatic lobe whichabutsportal vein.Both our local surgeon Dr. Barry Dienes andDr Carlis Abbott at [facility] concluded that cancer is not resectable, and therefore not likely curable,due to the invasion to portal vein. -He beganpalliativesystemic treatment standardfirst line chemo with IV Cisplatin and Gemcitabine 2 weeks on/1 week off.Started 09/29/19, tolerating well cisplatin was held with cycle 9 on 03/23/2020 due to fluid status/A. fib, now on maintenance gemcitabine alone -His FO results showed MSI stable disease, IDH1 mutations he may benefit from IDH inhibitor in the future -He continues single agent gemcitabine, s/p  cycle 13.  His case was recently discussed in GI tumor board including the role for radiation and or IR embolization for disease control.  Dr. Lisbeth Renshaw recommends trying to shrink the tumor by embolization prior to radiation.  He has been referred to IR, consult on 02/12/2020. -He underwent restaging with MR I of the abdomen and CT chest on 07/06/2020 which shows interval decrease in the size of the right hepatic lobe lesion indicating a response to treatment.  No new hepatic lesions, abdominal adenopathy, or definite metastatic disease in the chest.  Pulmonary nodules are stable and will continue to be monitored  2. Nausea, Low food intakeand weight loss -With pain from GERD and nausea he initially lost 30 pounds. -followed by dietician -weight fluctuation has some component of fluid retention in lower extremities -gaining weight recently on single agent gemcitabine   3.  New onset Afib, CHF -diagnosed after PAC placement on 02/23/20. -CHADS2 score 2, moderate risk for stroke. Started on metoprolol and eliquis on 03/01/20. Tolerating well without bleeding. -He was started on lasix 20 mg every other day after consult with Dr. Gwenlyn Found; his dyspnea and leg edema have improved -He had cardioversion on 05/19/2020 -continue f/u with cardiology, Dr. Gwenlyn Found   4. DM, HTN, HLD, Gout  -On Metformin, amlodipine, lisinopril., allopurinol  -Continue to f/u with his PCP -now on metoprolol and Eliquis for recently diagnosed Afib  5. Nicotine Use -He never smoked but has been chewing Tobacco for the past 60 years.He no longer drinks alcoholand tells me he has quit smoking (11/25/19).  6.GERDand gastritis, history ofesophageal candidiasis -Hehad repeated EGDwith Dr. Havery Moros in 07/2019. 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 Jowett appears stable.  He completed another cycle of single agent gemcitabine on days 1 and 8 q. 21 days.  Tolerates treatment very well without significant toxicities.  He continues to have intermittent lower leg edema, no concerning signs for DVT or cellulitis.  I encouraged him to continue elevating his legs and monitoring fluid status, takes Lasix as needed.  I personally reviewed his abdominal MRI and CT chest and discussed with the patient today.  The right hepatic lobe lesion now measures 3.9 x 2.5 cm, previously 6.6 x 4.3, no new or progressive disease in the chest abdomen or pelvis.  This indicates a response to treatment.  He is scheduled for IR consult on 8/25 to discuss targeted liver therapy.  CBC and CMP are stable, adequate for treatment.  He will proceed with single agent gemcitabine today as planned.  We will follow-up on his visit with IR to determine the next step in his treatment plan.  He will return for lab and chemo next week, then follow-up in 3 weeks with next cycle unless he is scheduled for liver therapy.   All questions were answered. The patient knows to call the clinic with any problems, questions or concerns. No barriers to learning was detected.     Alla Feeling, NP 07/12/20

## 2020-07-12 ENCOUNTER — Inpatient Hospital Stay: Payer: Medicare HMO

## 2020-07-12 ENCOUNTER — Encounter: Payer: Self-pay | Admitting: Nurse Practitioner

## 2020-07-12 ENCOUNTER — Inpatient Hospital Stay (HOSPITAL_BASED_OUTPATIENT_CLINIC_OR_DEPARTMENT_OTHER): Payer: Medicare HMO | Admitting: Nurse Practitioner

## 2020-07-12 ENCOUNTER — Other Ambulatory Visit: Payer: Self-pay

## 2020-07-12 VITALS — BP 160/82 | HR 74 | Temp 98.4°F | Resp 18 | Ht 69.0 in | Wt 216.6 lb

## 2020-07-12 DIAGNOSIS — C221 Intrahepatic bile duct carcinoma: Secondary | ICD-10-CM

## 2020-07-12 DIAGNOSIS — Z95828 Presence of other vascular implants and grafts: Secondary | ICD-10-CM

## 2020-07-12 DIAGNOSIS — Z7189 Other specified counseling: Secondary | ICD-10-CM

## 2020-07-12 DIAGNOSIS — Z5111 Encounter for antineoplastic chemotherapy: Secondary | ICD-10-CM | POA: Diagnosis not present

## 2020-07-12 LAB — CBC WITH DIFFERENTIAL (CANCER CENTER ONLY)
Abs Immature Granulocytes: 0.04 10*3/uL (ref 0.00–0.07)
Basophils Absolute: 0 10*3/uL (ref 0.0–0.1)
Basophils Relative: 1 %
Eosinophils Absolute: 0 10*3/uL (ref 0.0–0.5)
Eosinophils Relative: 1 %
HCT: 27.2 % — ABNORMAL LOW (ref 39.0–52.0)
Hemoglobin: 8.9 g/dL — ABNORMAL LOW (ref 13.0–17.0)
Immature Granulocytes: 1 %
Lymphocytes Relative: 16 %
Lymphs Abs: 1 10*3/uL (ref 0.7–4.0)
MCH: 36.6 pg — ABNORMAL HIGH (ref 26.0–34.0)
MCHC: 32.7 g/dL (ref 30.0–36.0)
MCV: 111.9 fL — ABNORMAL HIGH (ref 80.0–100.0)
Monocytes Absolute: 0.7 10*3/uL (ref 0.1–1.0)
Monocytes Relative: 12 %
Neutro Abs: 4.2 10*3/uL (ref 1.7–7.7)
Neutrophils Relative %: 69 %
Platelet Count: 224 10*3/uL (ref 150–400)
RBC: 2.43 MIL/uL — ABNORMAL LOW (ref 4.22–5.81)
RDW: 19.3 % — ABNORMAL HIGH (ref 11.5–15.5)
WBC Count: 5.9 10*3/uL (ref 4.0–10.5)
nRBC: 0 % (ref 0.0–0.2)

## 2020-07-12 LAB — CMP (CANCER CENTER ONLY)
ALT: 10 U/L (ref 0–44)
AST: 21 U/L (ref 15–41)
Albumin: 3.3 g/dL — ABNORMAL LOW (ref 3.5–5.0)
Alkaline Phosphatase: 43 U/L (ref 38–126)
Anion gap: 10 (ref 5–15)
BUN: 27 mg/dL — ABNORMAL HIGH (ref 8–23)
CO2: 23 mmol/L (ref 22–32)
Calcium: 9.9 mg/dL (ref 8.9–10.3)
Chloride: 108 mmol/L (ref 98–111)
Creatinine: 1.65 mg/dL — ABNORMAL HIGH (ref 0.61–1.24)
GFR, Est AFR Am: 47 mL/min — ABNORMAL LOW (ref >60)
GFR, Estimated: 41 mL/min — ABNORMAL LOW (ref >60)
Glucose, Bld: 97 mg/dL (ref 70–99)
Potassium: 3.6 mmol/L (ref 3.5–5.1)
Sodium: 141 mmol/L (ref 135–145)
Total Bilirubin: 0.7 mg/dL (ref 0.3–1.2)
Total Protein: 6.1 g/dL — ABNORMAL LOW (ref 6.5–8.1)

## 2020-07-12 MED ORDER — SODIUM CHLORIDE 0.9% FLUSH
10.0000 mL | INTRAVENOUS | Status: DC | PRN
  Administered 2020-07-12: 10 mL
  Filled 2020-07-12: qty 10

## 2020-07-12 MED ORDER — SODIUM CHLORIDE 0.9% FLUSH
10.0000 mL | Freq: Once | INTRAVENOUS | Status: AC
  Administered 2020-07-12: 10 mL
  Filled 2020-07-12: qty 10

## 2020-07-12 MED ORDER — PROCHLORPERAZINE MALEATE 10 MG PO TABS
10.0000 mg | ORAL_TABLET | Freq: Once | ORAL | Status: AC
  Administered 2020-07-12: 10 mg via ORAL

## 2020-07-12 MED ORDER — PROCHLORPERAZINE MALEATE 10 MG PO TABS
ORAL_TABLET | ORAL | Status: AC
  Filled 2020-07-12: qty 1

## 2020-07-12 MED ORDER — SODIUM CHLORIDE 0.9 % IV SOLN
1000.0000 mg/m2 | Freq: Once | INTRAVENOUS | Status: AC
  Administered 2020-07-12: 2166 mg via INTRAVENOUS
  Filled 2020-07-12: qty 56.97

## 2020-07-12 MED ORDER — SODIUM CHLORIDE 0.9 % IV SOLN
Freq: Once | INTRAVENOUS | Status: AC
  Filled 2020-07-12: qty 250

## 2020-07-12 MED ORDER — HEPARIN SOD (PORK) LOCK FLUSH 100 UNIT/ML IV SOLN
500.0000 [IU] | Freq: Once | INTRAVENOUS | Status: AC | PRN
  Administered 2020-07-12: 500 [IU]
  Filled 2020-07-12: qty 5

## 2020-07-12 NOTE — Progress Notes (Signed)
Scr. 1.69 today. Okay to treat per Cira Rue, PA

## 2020-07-12 NOTE — Patient Instructions (Signed)
[facility] Discharge Instructions for Patients Receiving Chemotherapy  Today you received the following chemotherapy agents: gemcitabine.  To help prevent nausea and vomiting after your treatment, we encourage you to take your nausea medication as directed.   If you develop nausea and vomiting that is not controlled by your nausea medication, call the clinic.   BELOW ARE SYMPTOMS THAT SHOULD BE REPORTED IMMEDIATELY:  *FEVER GREATER THAN 100.5 F  *CHILLS WITH OR WITHOUT FEVER  NAUSEA AND VOMITING THAT IS NOT CONTROLLED WITH YOUR NAUSEA MEDICATION  *UNUSUAL SHORTNESS OF BREATH  *UNUSUAL BRUISING OR BLEEDING  TENDERNESS IN MOUTH AND THROAT WITH OR WITHOUT PRESENCE OF ULCERS  *URINARY PROBLEMS  *BOWEL PROBLEMS  UNUSUAL RASH Items with * indicate a potential emergency and should be followed up as soon as possible.  Feel free to call the clinic should you have any questions or concerns. The clinic phone number is [phone].  Please show the CHEMO ALERT CARD at check-in to the Emergency Department and triage nurse.   

## 2020-07-13 ENCOUNTER — Telehealth: Payer: Self-pay | Admitting: Nurse Practitioner

## 2020-07-13 NOTE — Telephone Encounter (Signed)
Scheduled per 8/23 los. Unable to reach pt. Left voicemail with appt time and date.

## 2020-07-14 ENCOUNTER — Other Ambulatory Visit: Payer: Self-pay

## 2020-07-14 ENCOUNTER — Ambulatory Visit
Admission: RE | Admit: 2020-07-14 | Discharge: 2020-07-14 | Disposition: A | Discharge status: Home / Self Care | Payer: Medicare HMO | Source: Ambulatory Visit | Attending: Hematology | Admitting: Hematology

## 2020-07-14 DIAGNOSIS — C221 Intrahepatic bile duct carcinoma: Secondary | ICD-10-CM | POA: Diagnosis not present

## 2020-07-14 HISTORY — PX: IR RADIOLOGIST EVAL & MGMT: IMG5224

## 2020-07-14 NOTE — Consult Note (Signed)
Chief Complaint: Patient was consulted remotely today (TeleHealth) for intrahepatic cholangiocarcinoma at the request of Feng,Yan.    Referring Physician(s): Feng,Yan  History of Present Illness: Brandon Sherman is a 74 y.o. 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  He underwent biopsy in September 2020 which confirmed adenocarcinoma consistent with cholangiocarcinoma.  Based on lesion size, his clinical stage was cT2, cN0, cM0 stage II.  He began cisplatin and gemcitabine therapy in November 2020.  Cisplatin was subsequently held beginning in May 2021 due to issues with atrial fibrillation and congestive heart failure.  He is now on maintenance gemcitabine.  His last dose was 07/12/2020.  Overall, he reports that the chemotherapy regimen has been fairly easy on him.  He has had minimal side effects and maintains a good appetite and energy level.  He denies abdominal pain, nausea, vomiting or other systemic symptoms.  Past Medical History:  Diagnosis Date  . Arthritis   . Diabetes ([place])   . GERD (gastroesophageal reflux disease)   . Hyperlipidemia   . Hypertension   . Intrahepatic cholangiocarcinoma [facility])     Past Surgical History:  Procedure Laterality Date  . APPENDECTOMY  1980  . CARDIOVERSION N/A 04/27/2020   Procedure: CARDIOVERSION;  Surgeon: Dorothy Spark, MD;  Location[facility] ENDOSCOPY;  Service: Cardiovascular;  Laterality: N/A;  . CARDIOVERSION N/A 05/19/2020   Procedure: CARDIOVERSION;  Surgeon: Elouise Munroe, MD;  Location: [facility] ENDOSCOPY;  Service: Cardiovascular;  Laterality: N/A;  . COLONOSCOPY    . IR IMAGING GUIDED PORT INSERTION  02/23/2020  . IR RADIOLOGIST EVAL & MGMT  07/14/2020    Allergies: Patient has no known allergies.  Medications: Prior to Admission medications   Medication Sig Start Date End Date Taking? Authorizing Provider  allopurinol (ZYLOPRIM) 100 MG tablet Take 100 mg by mouth daily.     [provider]  apixaban (ELIQUIS) 5 MG TABS tablet Take 1 tablet (5 mg total) by mouth 2 (two) times daily. 03/24/20   Lorretta Harp, MD  colchicine 0.6 MG tablet Take 1 tablet (0.6 mg total) by mouth daily as needed (gout flare). Take daily for 3 days, then as needed for gout flare 05/22/20 06/21/20  Rai, Vernelle Emerald, MD  diltiazem (CARDIZEM CD) 180 MG 24 hr capsule Take 1 capsule (180 mg total) by mouth daily. 05/11/20 08/09/20  Lorretta Harp, MD  fenofibrate (TRICOR) 145 MG tablet Take 145 mg by mouth daily.    [provider]  finasteride (PROSCAR) 5 MG tablet Take 5 mg by mouth daily.    [provider]  furosemide (LASIX) 40 MG tablet Take 1 tablet (40 mg total) by mouth daily. 05/22/20   Rai, Vernelle Emerald, MD  hydrALAZINE (APRESOLINE) 50 MG tablet Take 1 tablet (50 mg total) by mouth in the morning and at bedtime. 06/01/20 08/30/20  Lorretta Harp, MD  lidocaine-prilocaine (EMLA) cream Apply 1 application topically as needed. 03/29/20   Alla Feeling, NP  magnesium oxide (MAG-OX) 400 MG tablet Take 1 tablet (400 mg total) by mouth daily. 05/31/20   [name], MD  metFORMIN (GLUCOPHAGE) 500 MG tablet Take 500 mg by mouth 2 (two) times daily with a meal.     [provider]  metoprolol succinate (TOPROL XL) 25 MG 24 hr tablet Take 1 tablet (25 mg total) by mouth daily. 06/23/20   Erlene Quan, PA-C  ondansetron (ZOFRAN) 8 MG tablet Take 1 tablet (8 mg total)  by mouth 2 (two) times daily as needed. Start on the third day after chemotherapy. 05/03/20   [name], MD  pantoprazole (PROTONIX) 40 MG tablet Take 1 tablet (40 mg total) by mouth 2 (two) times daily. 05/03/20   [name], MD  pravastatin (PRAVACHOL) 40 MG tablet Take 40 mg by mouth daily.    [provider]  sucralfate (CARAFATE) 1 g tablet Take 1 tablet (1 g total) by mouth every 6 (six) hours as needed. Please schedule a yearly follow up for further refills: [phone] 06/15/20   Yetta Flock, MD  tamsulosin (FLOMAX) 0.4 MG CAPS capsule Take 0.4 mg by mouth daily.    [provider]  Vitamin D, Ergocalciferol, (DRISDOL) 1.25 MG (50000 UT) CAPS capsule Take 50,000 Units by mouth every 7 (seven) days.    [provider]     Family History  Problem Relation Age of Onset  . Heart attack Mother   . Stroke Father   . Colon cancer Neg Hx   . Esophageal cancer Neg Hx   . Stomach cancer Neg Hx   . Rectal cancer Neg Hx   . Colon polyps Neg Hx     Social History   Socioeconomic History  . Marital status: Married    Spouse name: Not on file  . Number of children: 3  . Years of education: Not on file  . Highest education level: Not on file  Occupational History  . Not on file  Tobacco Use  . Smoking status: Never Smoker  . Smokeless tobacco: Current User    Types: Chew  . Tobacco comment: for 60 years, 1-2 daily   Vaping Use  . Vaping Use: Never used  Substance and Sexual Activity  . Alcohol use: Not Currently    Comment: occasionally  . Drug use: Never  . Sexual activity: Not on file  Other Topics Concern  . Not on file  Social History Narrative  . Not on file   Social Determinants of Health   Financial Resource Strain:   . Difficulty of Paying Living Expenses: Not on file  Food Insecurity:   . Worried About Charity fundraiser in the Last Year: Not on file  . Ran Out of Food in the Last Year: Not on file  Transportation Needs: No Transportation Needs  . Lack of Transportation (Medical): No  . Lack of Transportation (Non-Medical): No  Physical Activity: Insufficiently Active  . Days of Exercise per Week: 1 day  . Minutes of Exercise per Session: 50 min  Stress:   . Feeling of Stress : Not on file  Social Connections: Unknown  . Frequency of Communication with Friends and Family: More than three times a week  . Frequency of Social Gatherings with Friends and Family: More than three times a week  . Attends Religious Services: Not  on file  . Active Member of Clubs or Organizations: Not on file  . Attends Archivist Meetings: Not on file  . Marital Status: Married    ECOG Status: 0 - Asymptomatic  Review of Systems  Review of Systems: A 12 point ROS discussed and pertinent positives are indicated in the HPI above.  All other systems are negative.  Physical Exam No direct physical exam was performed (except for noted visual exam findings with Video Visits).   Vital Signs: There were no vitals taken for this visit.  Imaging: CT Chest Wo Contrast  Result Date: 07/06/2020 CLINICAL DATA:  History  of head and neck cancer. Assess treatment response. EXAM: CT CHEST WITHOUT CONTRAST TECHNIQUE: Multidetector CT imaging of the chest was performed following the standard protocol without IV contrast. COMPARISON:  CT scan 04/16/2020 FINDINGS: Cardiovascular: The heart is normal in size. Small stable pericardial effusion. Stable tortuosity, mild ectasia and mild calcification of the thoracic aorta. Stable advanced three-vessel coronary artery calcifications. The right power port tip is in good position in the distal SVC. Mediastinum/Nodes: Small scattered mediastinal and hilar lymph nodes but no mass or adenopathy. The esophagus is grossly normal. Lungs/Pleura: Persistent/stable moderate-sized bilateral pleural effusions. Minimal overlying atelectasis. Small nodules in the right lung are stable. 6 mm nodule in the right lower lobe on image number 86. 2.5 mm right middle lobe nodule on image 97/7 is stable. 3.5 mm right middle lobe nodule on image 118/7 is stable. Stable calcified granuloma in the left lower lobe on image 92/7. Upper Abdomen: Stable vascular calcifications.  Stable gallstones. Musculoskeletal: No significant bony findings. IMPRESSION: 1. Persistent/stable moderate-sized bilateral pleural effusions with overlying atelectasis. 2. Stable small right pulmonary nodules. No new or progressive findings. Recommend  continued surveillance. 3. No mediastinal or hilar mass or adenopathy. 4. Stable advanced three-vessel coronary artery calcifications. 5. Cholelithiasis. Aortic Atherosclerosis (ICD10-I70.0) Electronically Signed   By: Marijo Sanes M.D.   On: 07/06/2020 13:36   MR Abdomen W Wo Contrast  Result Date: 07/06/2020 CLINICAL DATA:  Follow-up cholangiocarcinoma. EXAM: MRI ABDOMEN WITHOUT AND WITH CONTRAST TECHNIQUE: Multiplanar multisequence MR imaging of the abdomen was performed both before and after the administration of intravenous contrast. CONTRAST:  5mL GADAVIST GADOBUTROL 1 MMOL/ML IV SOLN COMPARISON:  MRI abdomen 07/31/2019 and CT scan 12/24/2019 FINDINGS: Lower chest: The lung bases are clear of an acute process. No pulmonary lesions or pleural or pericardial effusion. Hepatobiliary: There is a diffusion positive lesion again demonstrated in the right hepatic lobe in the medial aspect of segment 7. Lesion is faintly visualized on the T1 and T2 weighted images and is most apparent on the delayed post-contrast images. The lesion measures approximately 3.9 x 2.5 cm on image 26/17. It previously measured 6.6 x 4.3 cm on the prior study on image 35/905. Findings would suggest a good response to treatment with some contraction of the tumor. There is persistent mild intrahepatic biliary dilatation in the right hepatic lobe distal to the lesion. No new hepatic lesions are identified. On the delayed images some of the hepatic and portal vein radicles have a somewhat tortuous irregular and almost beaded appearance. Findings could be radiation related. No change since CT scan of 10/09/2019. The gallbladder demonstrates small gallstones but no findings for acute cholecystitis. Normal caliber and course of the common bile duct. Pancreas:  No mass, inflammation or ductal dilatation. Spleen:  Normal size.  No focal lesions. Adrenals/Urinary Tract: The adrenal glands and kidneys are unremarkable. Small renal cysts are  stable. Stomach/Bowel: The stomach, duodenum, visualized small bowel and visualized colon are unremarkable. Vascular/Lymphatic: The aorta and branch vessels are patent. Moderate atherosclerotic calcifications. The major venous structures are patent. No mesenteric or retroperitoneal mass or adenopathy. Other:  No ascites or abdominal wall hernia. Musculoskeletal: No significant bony findings. Stable L4 hemangioma. IMPRESSION: 1. Interval decrease in size of the right hepatic lobe lesion in the medial aspect of the segment 7. Findings would suggest a good response to treatment with some contraction of the tumor. 2. No new hepatic lesions. No abdominal adenopathy or metastatic disease. 3. Stable mild intrahepatic biliary dilatation in the right  hepatic lobe distal to the lesion. 4. Somewhat tortuous and almost beaded appearance of the hepatic and portal vein radicles. Findings could be radiation related. 5. Cholelithiasis. Electronically Signed   By: Marijo Sanes M.D.   On: 07/06/2020 13:50   IR Radiologist Eval & Mgmt  Result Date: 07/14/2020 Please refer to notes tab for details about interventional procedure. (Op Note)   Labs:  CBC: Recent Labs    06/07/20 0820 06/21/20 0825 06/28/20 0847 07/12/20 0815  WBC 3.8* 5.7 3.2* 5.9  HGB 9.5* 9.5* 8.8* 8.9*  HCT 29.1* 28.8* 26.5* 27.2*  PLT 139* 218 203 224    COAGS: Recent Labs    08/11/19 1059 02/23/20 1026  INR 1.0 1.1    BMP: Recent Labs    06/07/20 0820 06/21/20 0825 06/28/20 0847 07/12/20 0815  NA 141 140 138 141  K 3.6 3.9 4.0 3.6  CL 107 107 108 108  CO2 24 24 23 23   GLUCOSE 103* 98 102* 97  BUN 21 19 26* 27*  CALCIUM 9.3 9.6 9.6 9.9  CREATININE 1.38* 1.45* 1.69* 1.65*  GFRNONAA 51* 48* 40* 41*  GFRAA 59* 55* 46* 47*    LIVER FUNCTION TESTS: Recent Labs    06/07/20 0820 06/21/20 0825 06/28/20 0847 07/12/20 0815  BILITOT 0.6 0.6 0.6 0.7  AST 27 20 22 21   ALT 23 13 12 10   ALKPHOS 51 40 48 43  PROT 6.4* 6.2*  6.2* 6.1*  ALBUMIN 3.3* 3.5 3.2* 3.3*    TUMOR MARKERS: Recent Labs    08/06/19 0836  AFPTM 5.0    Assessment and Plan:  Very pleasant 74 year old male with intrahepatic cholangiocarcinoma.  He has thus far had an excellent response to therapy with decreasing size of his intrahepatic cholangiocarcinoma.  The lesion remains enhancing and viable.  We discussed the possibilities for liver directed therapy including bland embolization, chemoembolization and transarterial radio embolization.  I also discussed his case with his oncologist, Dr. Burr Medico.  We are in agreement that the optimal liver directed therapy would be transarterial radio embolization with attempted segmentectomy of the hepatic segment containing the lesion (7).    He is currently on maintenance gemcitabine.  Gemcitabine is a potent radiation potentiator.  He will need to hold gemcitabine for at least 4 weeks prior to radioembolization and for an additional 4 weeks after.  His last infusion was 07/12/2020.  We can plan to schedule his pre-Y 57 planning study in mid to late September and plan his radio embolization in for early October.  1.)  Schedule for pre-Y 3 planning study to be followed by segment 7 segmentectomy.  Please target mid to late September for the planning study and early October for his treatment session.  [id number]  Thank you for this interesting consult.  I greatly enjoyed meeting Brandon Sherman and look forward to participating in their care.  A copy of this report was sent to the requesting provider on this date.  Electronically Signed: Jacqulynn Cadet 07/14/2020, 3:47 PM   I spent a total of 30 Minutes  in remote  clinical consultation, greater than 50% of which was counseling/coordinating care for intrahepatic cholangiocarcinoma.    Visit type: Audio and video [employer]).   Alternative for in-person consultation at [facility], [address], [place], [place]. This visit type  was conducted due to national recommendations for restrictions regarding the COVID-19 Pandemic (e.g. social distancing).  This format is felt to be most appropriate for this patient at this  time.  All issues noted in this document were discussed and addressed.

## 2020-07-19 ENCOUNTER — Ambulatory Visit (HOSPITAL_COMMUNITY)
Admission: RE | Admit: 2020-07-19 | Discharge: 2020-07-19 | Disposition: A | Discharge status: Home / Self Care | Payer: Medicare HMO | Source: Ambulatory Visit | Attending: Medical | Admitting: Medical

## 2020-07-19 ENCOUNTER — Inpatient Hospital Stay: Payer: Medicare HMO

## 2020-07-19 ENCOUNTER — Inpatient Hospital Stay (HOSPITAL_BASED_OUTPATIENT_CLINIC_OR_DEPARTMENT_OTHER): Payer: Medicare HMO | Admitting: Medical

## 2020-07-19 ENCOUNTER — Other Ambulatory Visit: Payer: Self-pay

## 2020-07-19 ENCOUNTER — Other Ambulatory Visit: Payer: Self-pay | Admitting: Medical

## 2020-07-19 VITALS — BP 163/86 | HR 92 | Temp 98.2°F | Resp 17

## 2020-07-19 DIAGNOSIS — C221 Intrahepatic bile duct carcinoma: Secondary | ICD-10-CM

## 2020-07-19 DIAGNOSIS — R600 Localized edema: Secondary | ICD-10-CM | POA: Diagnosis not present

## 2020-07-19 DIAGNOSIS — Z7189 Other specified counseling: Secondary | ICD-10-CM

## 2020-07-19 DIAGNOSIS — J90 Pleural effusion, not elsewhere classified: Secondary | ICD-10-CM

## 2020-07-19 DIAGNOSIS — Z95828 Presence of other vascular implants and grafts: Secondary | ICD-10-CM

## 2020-07-19 DIAGNOSIS — Z5111 Encounter for antineoplastic chemotherapy: Secondary | ICD-10-CM | POA: Diagnosis not present

## 2020-07-19 LAB — CBC WITH DIFFERENTIAL (CANCER CENTER ONLY)
Abs Immature Granulocytes: 0.17 10*3/uL — ABNORMAL HIGH (ref 0.00–0.07)
Basophils Absolute: 0.1 10*3/uL (ref 0.0–0.1)
Basophils Relative: 2 %
Eosinophils Absolute: 0 10*3/uL (ref 0.0–0.5)
Eosinophils Relative: 1 %
HCT: 25.8 % — ABNORMAL LOW (ref 39.0–52.0)
Hemoglobin: 8.4 g/dL — ABNORMAL LOW (ref 13.0–17.0)
Immature Granulocytes: 5 %
Lymphocytes Relative: 29 %
Lymphs Abs: 1 10*3/uL (ref 0.7–4.0)
MCH: 36.7 pg — ABNORMAL HIGH (ref 26.0–34.0)
MCHC: 32.6 g/dL (ref 30.0–36.0)
MCV: 112.7 fL — ABNORMAL HIGH (ref 80.0–100.0)
Monocytes Absolute: 0.9 10*3/uL (ref 0.1–1.0)
Monocytes Relative: 26 %
Neutro Abs: 1.3 10*3/uL — ABNORMAL LOW (ref 1.7–7.7)
Neutrophils Relative %: 37 %
Platelet Count: 216 10*3/uL (ref 150–400)
RBC: 2.29 MIL/uL — ABNORMAL LOW (ref 4.22–5.81)
RDW: 18.3 % — ABNORMAL HIGH (ref 11.5–15.5)
WBC Count: 3.5 10*3/uL — ABNORMAL LOW (ref 4.0–10.5)
nRBC: 6.1 % — ABNORMAL HIGH (ref 0.0–0.2)

## 2020-07-19 LAB — CMP (CANCER CENTER ONLY)
ALT: 15 U/L (ref 0–44)
AST: 32 U/L (ref 15–41)
Albumin: 3.1 g/dL — ABNORMAL LOW (ref 3.5–5.0)
Alkaline Phosphatase: 54 U/L (ref 38–126)
Anion gap: 10 (ref 5–15)
BUN: 23 mg/dL (ref 8–23)
CO2: 22 mmol/L (ref 22–32)
Calcium: 9.4 mg/dL (ref 8.9–10.3)
Chloride: 107 mmol/L (ref 98–111)
Creatinine: 1.52 mg/dL — ABNORMAL HIGH (ref 0.61–1.24)
GFR, Est AFR Am: 52 mL/min — ABNORMAL LOW (ref >60)
GFR, Estimated: 45 mL/min — ABNORMAL LOW (ref >60)
Glucose, Bld: 102 mg/dL — ABNORMAL HIGH (ref 70–99)
Potassium: 3.3 mmol/L — ABNORMAL LOW (ref 3.5–5.1)
Sodium: 139 mmol/L (ref 135–145)
Total Bilirubin: 0.9 mg/dL (ref 0.3–1.2)
Total Protein: 6.2 g/dL — ABNORMAL LOW (ref 6.5–8.1)

## 2020-07-19 IMAGING — DX DG CHEST DECUBITUS BILAT
2 series · 2 of 2 positions shown · non-contrast
Comparison: [DATE]

CLINICAL DATA: History of pleural effusion.

EXAM:
CHEST - BILATERAL DECUBITUS VIEW

[chest decu (1 of 2)]
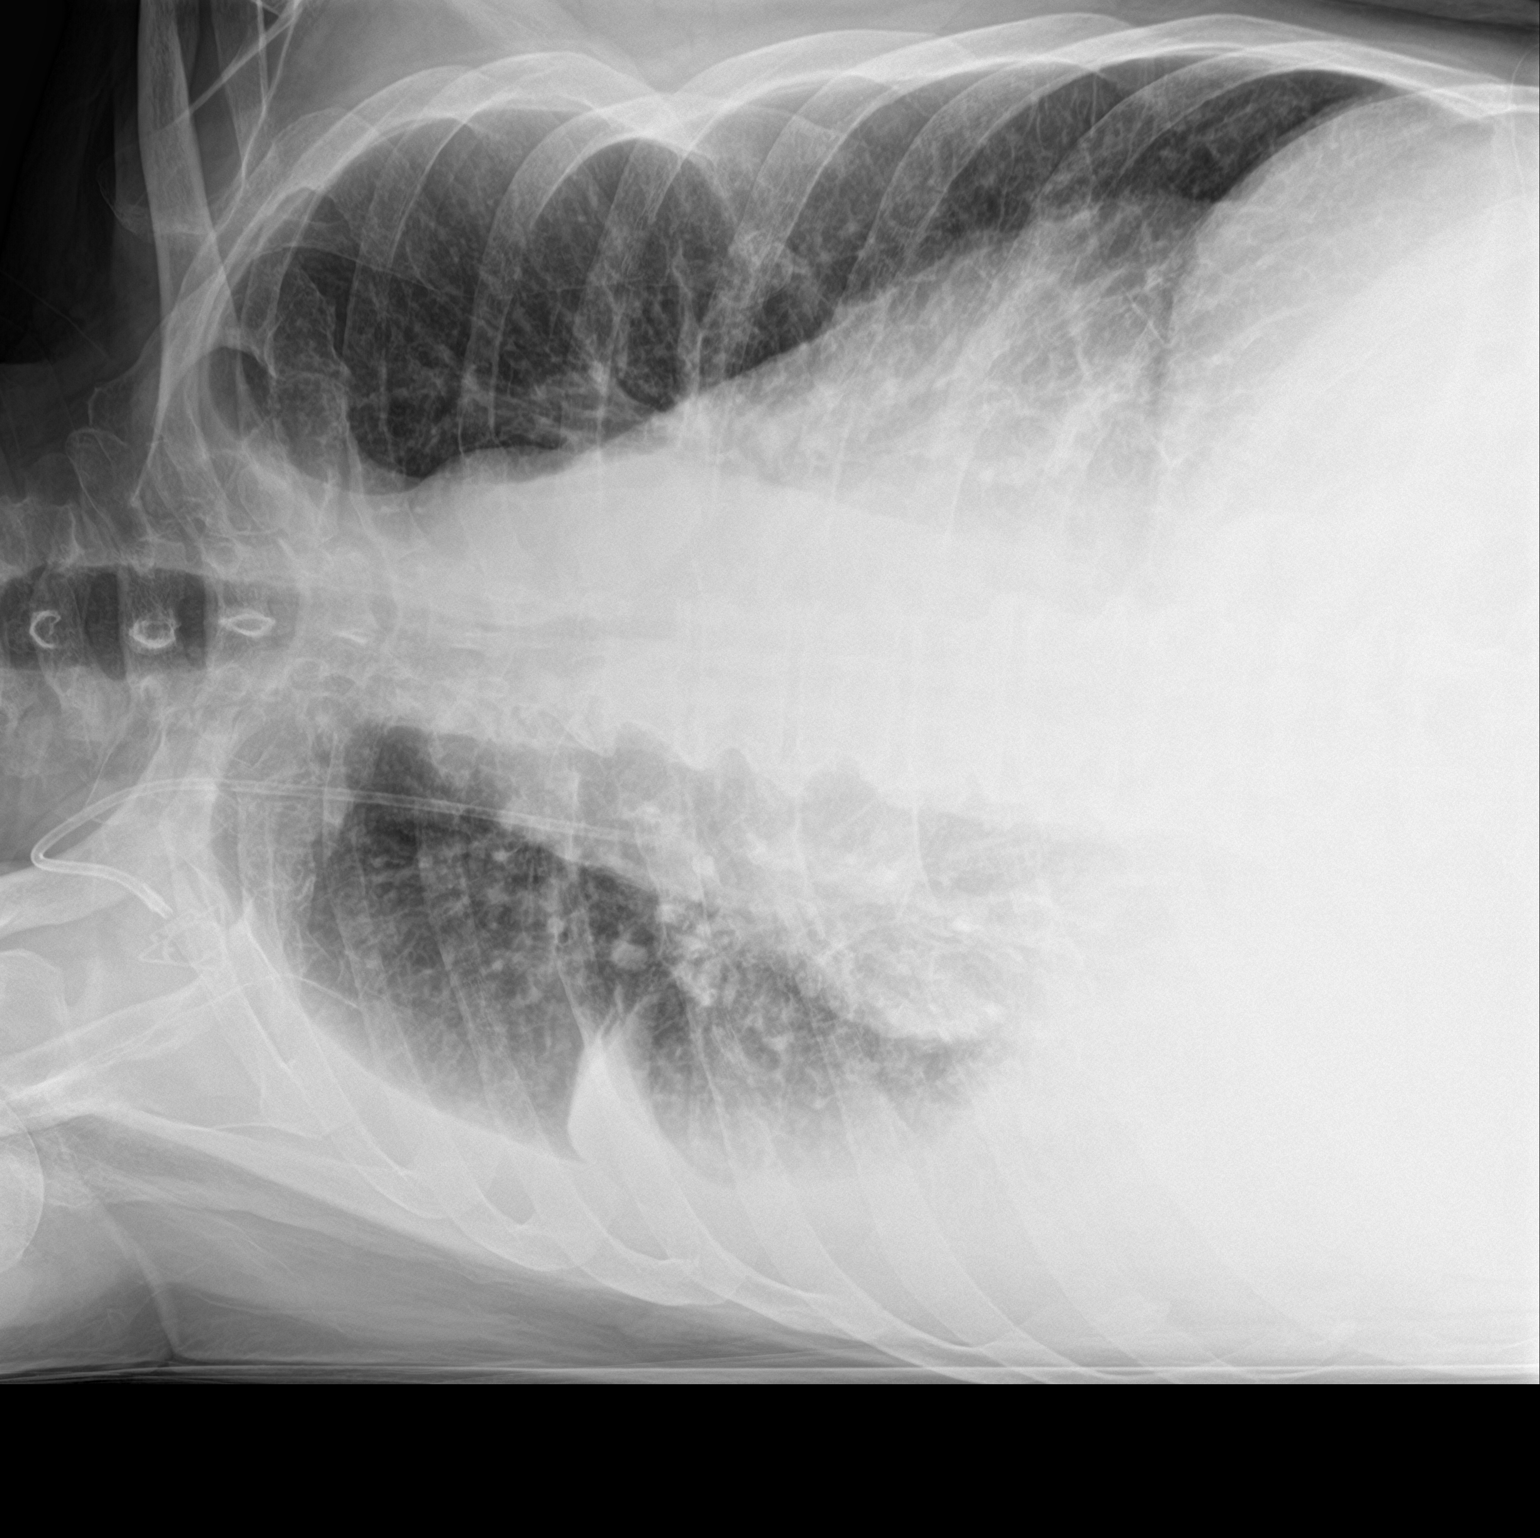

[chest decu (2 of 2)]
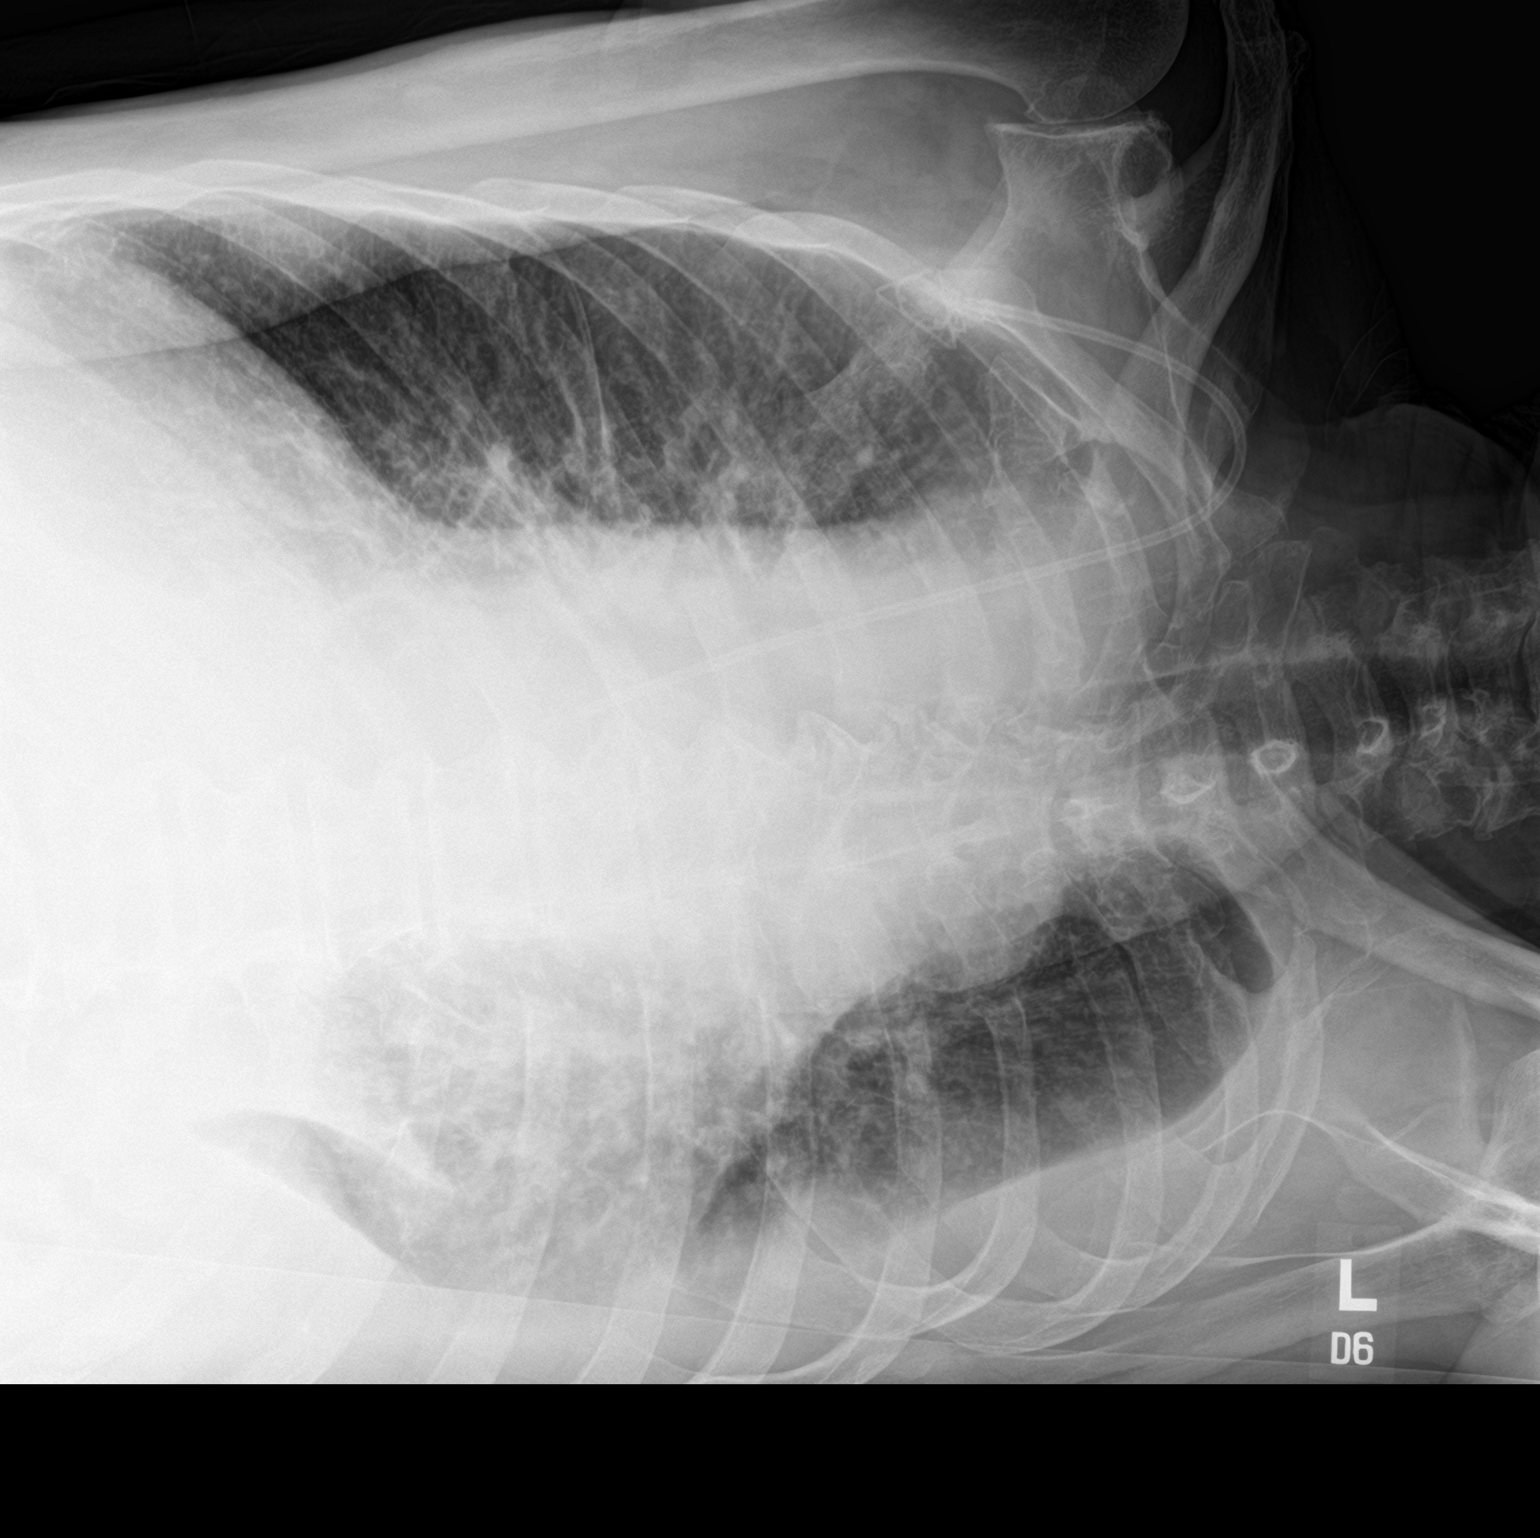

[2 of 2 positions shown; findings below may reference images not displayed]

FINDINGS: Right chest wall port a catheter tip is in the distal SVC. Unchanged
cardiac enlargement. Right side down lateral decubitus radiograph
shows a moderate size free-flowing right pleural effusion. Increase
interlobular septal thickening is identified compatible with mild
pulmonary edema. No airspace consolidation.
IMPRESSION: 1. Moderate size free-flowing right pleural effusion and mild
interstitial edema

## 2020-07-19 MED ORDER — SODIUM CHLORIDE 0.9% FLUSH
10.0000 mL | Freq: Once | INTRAVENOUS | Status: AC
  Administered 2020-07-19: 10 mL
  Filled 2020-07-19: qty 10

## 2020-07-19 MED ORDER — SODIUM CHLORIDE 0.9% FLUSH
10.0000 mL | INTRAVENOUS | Status: DC | PRN
  Administered 2020-07-19: 10 mL
  Filled 2020-07-19: qty 10

## 2020-07-19 MED ORDER — PROCHLORPERAZINE MALEATE 10 MG PO TABS
ORAL_TABLET | ORAL | Status: AC
  Filled 2020-07-19: qty 1

## 2020-07-19 MED ORDER — SODIUM CHLORIDE 0.9 % IV SOLN
Freq: Once | INTRAVENOUS | Status: AC
  Filled 2020-07-19: qty 250

## 2020-07-19 MED ORDER — HEPARIN SOD (PORK) LOCK FLUSH 100 UNIT/ML IV SOLN
500.0000 [IU] | Freq: Once | INTRAVENOUS | Status: AC | PRN
  Administered 2020-07-19: 500 [IU]
  Filled 2020-07-19: qty 5

## 2020-07-19 MED ORDER — SODIUM CHLORIDE 0.9 % IV SOLN
1000.0000 mg/m2 | Freq: Once | INTRAVENOUS | Status: AC
  Administered 2020-07-19: 2166 mg via INTRAVENOUS
  Filled 2020-07-19: qty 56.97

## 2020-07-19 MED ORDER — PROCHLORPERAZINE MALEATE 10 MG PO TABS
10.0000 mg | ORAL_TABLET | Freq: Once | ORAL | Status: AC
  Administered 2020-07-19: 10 mg via ORAL

## 2020-07-19 NOTE — Patient Instructions (Signed)
[facility] Discharge Instructions for Patients Receiving Chemotherapy  Today you received the following chemotherapy agents: gemcitabine.  To help prevent nausea and vomiting after your treatment, we encourage you to take your nausea medication as directed.   If you develop nausea and vomiting that is not controlled by your nausea medication, call the clinic.   BELOW ARE SYMPTOMS THAT SHOULD BE REPORTED IMMEDIATELY:  *FEVER GREATER THAN 100.5 F  *CHILLS WITH OR WITHOUT FEVER  NAUSEA AND VOMITING THAT IS NOT CONTROLLED WITH YOUR NAUSEA MEDICATION  *UNUSUAL SHORTNESS OF BREATH  *UNUSUAL BRUISING OR BLEEDING  TENDERNESS IN MOUTH AND THROAT WITH OR WITHOUT PRESENCE OF ULCERS  *URINARY PROBLEMS  *BOWEL PROBLEMS  UNUSUAL RASH Items with * indicate a potential emergency and should be followed up as soon as possible.  Feel free to call the clinic should you have any questions or concerns. The clinic phone number is [phone].  Please show the CHEMO ALERT CARD at check-in to the Emergency Department and triage nurse.   

## 2020-07-19 NOTE — Progress Notes (Signed)
OK to treat today Per Dr. Burr Medico with Anc 1.3 and Creatinine 1.52

## 2020-07-21 ENCOUNTER — Other Ambulatory Visit: Payer: Self-pay

## 2020-07-21 ENCOUNTER — Telehealth: Payer: Self-pay | Admitting: Cardiovascular Disease

## 2020-07-21 DIAGNOSIS — R0600 Dyspnea, unspecified: Secondary | ICD-10-CM

## 2020-07-21 MED ORDER — FUROSEMIDE 40 MG PO TABS: 40.0000 mg | ORAL_TABLET | Freq: Every day | ORAL | 9 refills | Status: DC

## 2020-07-21 NOTE — Telephone Encounter (Signed)
*  STAT* If patient is at the pharmacy, call can be transferred to refill team.   1. Which medications need to be refilled? (please list name of each medication and dose if known) furosemide (LASIX) 40 MG tablet  2. Which pharmacy/location (including street and city if local pharmacy) is medication to be sent to? [id number] - [place], [place] - [address]  3. Do they need a 30 day or 90 day supply? 90 day supply

## 2020-07-21 NOTE — Telephone Encounter (Signed)
Left a message for the patient to call back.  

## 2020-07-21 NOTE — Telephone Encounter (Signed)
Pt c/o medication issue:  1. Name of Medication: furosemide (LASIX) 40 MG tablet  2. How are you currently taking this medication (dosage and times per day)? 3 times a day  3. Are you having a reaction (difficulty breathing--STAT)? no  4. What is your medication issue? Patient's wife states the doctor at the [facility] changed the prescription from 1 tablet a day to 1 tablet 3 times a day. She states he needs the prescription changed and sent to his pharmacy.

## 2020-07-22 ENCOUNTER — Other Ambulatory Visit: Payer: Self-pay | Admitting: Medical

## 2020-07-22 DIAGNOSIS — R0600 Dyspnea, unspecified: Secondary | ICD-10-CM

## 2020-07-22 MED ORDER — FUROSEMIDE 40 MG PO TABS: ORAL_TABLET | ORAL | 1 refills | Status: DC

## 2020-07-22 MED ORDER — POTASSIUM CHLORIDE CRYS ER 20 MEQ PO TBCR
20.0000 meq | EXTENDED_RELEASE_TABLET | Freq: Two times a day (BID) | ORAL | 1 refills | Status: DC
Start: 2020-07-22 — End: 2020-08-19

## 2020-07-22 NOTE — Telephone Encounter (Signed)
Called and made patient aware that Sandi Mealy, PA has sent in Rxs for lasix 40 mg TID and for potassium 20 mEq BID. They verbalized understanding and thanked me for the call.

## 2020-07-22 NOTE — Progress Notes (Signed)
[facility] Progress Note   Brandon Sherman [id number] February 28, 1946 74 y.o.  Brandon Sherman is managed by Dr. [name]  Actively treated with chemotherapy/immunotherapy/hormonal therapy: yes  Current therapy: Gemcitabine  Last treated: 07/19/2020 (cycle 14, day 8)  Next scheduled appointment with provider: 08/02/2020  Assessment: Plan:    Intrahepatic cholangiocarcinoma (HCC)  Pleural effusion  Bilateral lower extremity edema   Intrahepatic cholangiocarcinoma, cT2N0Mx,unresectable,with indeterminate lung nodules: Brandon Sherman continues on gemcitabine and is receiving cycle 14, day 8 of therapy today.  He is scheduled to return for follow-up on 08/02/2020.   History of a pleural effusion: The patient was referred for a chest x-ray today which returned showing:  FINDINGS: Right chest wall port a catheter tip is in the distal SVC.  Unchanged cardiac enlargement. Right side down lateral decubitus radiograph shows a moderate size free-flowing right pleural effusion. Increase interlobular septal thickening is identified compatible with mild pulmonary edema.  No airspace consolidation.  IMPRESSION: 1. Moderate size free-flowing right pleural effusion and mild interstitial edema  No thoracentesis is planned at this point.  The patient was instructed to return before his next scheduled appointment should he have progressive shortness of breath or signs of a progressive pleural effusion.   Bilateral lower extremity edema: The patient is currently on Lasix 40 mg p.o. twice daily.  He was told to increase his Lasix to 3 times daily for a few days.  He was also told to elevate his lower extremities higher than his hips but not higher than his chest.     Please see After Visit Summary for patient specific instructions.  Future Appointments  Date Time Provider [place]  08/02/2020  7:45 AM [facility] None  08/02/2020  8:00 AM  [facility] INFUSION [facility] None  08/02/2020  8:20 AM [name], MD [facility] None  08/02/2020  9:30 AM [facility] INFUSION [facility] None  08/09/2020  8:15 AM [facility] LAB [facility] None  08/09/2020  8:30 AM [facility] [place] FLUSH [facility] None  08/09/2020  9:30 AM [facility] INFUSION [facility] None  08/12/2020  1:45 PM Duke, Tami Lin, PA [facility]  08/24/2020 11:30 AM Lorretta Harp, MD [facility]  11/05/2020 10:15 AM Lorretta Harp, MD [facility]    No orders of the defined types were placed in this encounter.      Subjective:   Patient ID:  Brandon Sherman is a 74 y.o. (DOB 1946-01-25) male.  Chief Complaint: No chief complaint on file.   HPI Brandon Sherman  is a 74 y.o. male with a diagnosis of an intrahepatic cholangiocarcinoma, cT2N0Mx,unresectable,with indeterminate lung nodules.  He is managed by Dr. [name] and continues on gemcitabine and is receiving cycle 14, day 8 of therapy today.  He was seen in the infusion room today during his treatment.  He has a history of bilateral lower extremity edema which continues despite the fact that he is on Lasix 40 mg twice daily.  He also has a history of a bilateral pleural effusion and reports that he had a thoracentesis last week.  He has no increased shortness of breath or cough but asked that his lungs be evaluated today.  He was referred for a chest x-ray which returned showing:  FINDINGS: Right chest wall port a catheter tip is in the distal SVC.  Unchanged cardiac enlargement. Right side down lateral decubitus radiograph shows a moderate size free-flowing right pleural effusion. Increase interlobular septal thickening is identified compatible with mild  pulmonary edema.  No airspace consolidation.  IMPRESSION: 1. Moderate size free-flowing right pleural effusion and mild interstitial edema  The results of the chest x-ray were called to the patient.  A message was left  for him stating that we will hold a thoracentesis at this time.  He was told to call or return should he have progressive shortness of breath or a progressive cough at which time reconsideration would be given for a thoracentesis.     Medications: I have reviewed the patient's current medications.  Allergies: No Known Allergies  Past Medical History:  Diagnosis Date  . Arthritis   . Diabetes ([place])   . GERD (gastroesophageal reflux disease)   . Hyperlipidemia   . Hypertension   . Intrahepatic cholangiocarcinoma [facility])     Past Surgical History:  Procedure Laterality Date  . APPENDECTOMY  1980  . CARDIOVERSION N/A 04/27/2020   Procedure: CARDIOVERSION;  Surgeon: Dorothy Spark, MD;  Location: [facility] ENDOSCOPY;  Service: Cardiovascular;  Laterality: N/A;  . CARDIOVERSION N/A 05/19/2020   Procedure: CARDIOVERSION;  Surgeon: Elouise Munroe, MD;  Location: [facility] ENDOSCOPY;  Service: Cardiovascular;  Laterality: N/A;  . COLONOSCOPY    . IR IMAGING GUIDED PORT INSERTION  02/23/2020  . IR RADIOLOGIST EVAL & MGMT  07/14/2020    Family History  Problem Relation Age of Onset  . Heart attack Mother   . Stroke Father   . Colon cancer Neg Hx   . Esophageal cancer Neg Hx   . Stomach cancer Neg Hx   . Rectal cancer Neg Hx   . Colon polyps Neg Hx     Social History   Socioeconomic History  . Marital status: Married    Spouse name: Not on file  . Number of children: 3  . Years of education: Not on file  . Highest education level: Not on file  Occupational History  . Not on file  Tobacco Use  . Smoking status: Never Smoker  . Smokeless tobacco: Current User    Types: Chew  . Tobacco comment: for 60 years, 1-2 daily   Vaping Use  . Vaping Use: Never used  Substance and Sexual Activity  . Alcohol use: Not Currently    Comment: occasionally  . Drug use: Never  . Sexual activity: Not on file  Other Topics Concern  . Not on file  Social History Narrative  . Not on file   Social  Determinants of Health   Financial Resource Strain:   . Difficulty of Paying Living Expenses: Not on file  Food Insecurity:   . Worried About Charity fundraiser in the Last Year: Not on file  . Ran Out of Food in the Last Year: Not on file  Transportation Needs: No Transportation Needs  . Lack of Transportation (Medical): No  . Lack of Transportation (Non-Medical): No  Physical Activity: Insufficiently Active  . Days of Exercise per Week: 1 day  . Minutes of Exercise per Session: 50 min  Stress:   . Feeling of Stress : Not on file  Social Connections: Unknown  . Frequency of Communication with Friends and Family: More than three times a week  . Frequency of Social Gatherings with Friends and Family: More than three times a week  . Attends Religious Services: Not on file  . Active Member of Clubs or Organizations: Not on file  . Attends Archivist Meetings: Not on file  . Marital Status: Married  Human resources officer Violence:   .  Fear of Current or Ex-Partner: Not on file  . Emotionally Abused: Not on file  . Physically Abused: Not on file  . Sexually Abused: Not on file    Past Medical History, Surgical history, Social history, and Family history were reviewed and updated as appropriate.   Please see review of systems for further details on the patient's review from today.   Review of Systems:  Review of Systems  Constitutional: Negative for chills, diaphoresis and fever.  HENT: Negative for trouble swallowing and voice change.   Respiratory: Negative for cough, chest tightness, shortness of breath and wheezing.   Cardiovascular: Positive for leg swelling. Negative for chest pain and palpitations.  Gastrointestinal: Negative for abdominal pain, constipation, diarrhea, nausea and vomiting.  Musculoskeletal: Negative for back pain and myalgias.  Neurological: Negative for dizziness, light-headedness and headaches.    Objective:   Physical Exam:  There were no  vitals taken for this visit. ECOG: 1  Physical Exam Constitutional:      General: He is not in acute distress.    Appearance: He is not diaphoretic.  HENT:     Head: Normocephalic and atraumatic.  Eyes:     General: No scleral icterus.       Right eye: No discharge.        Left eye: No discharge.  Cardiovascular:     Rate and Rhythm: Normal rate and regular rhythm.     Heart sounds: Normal heart sounds. No murmur heard.  No friction rub. No gallop.   Pulmonary:     Effort: Pulmonary effort is normal. No respiratory distress.     Breath sounds: Examination of the left-lower field reveals decreased breath sounds. Decreased breath sounds present. No wheezing, rhonchi or rales.  Musculoskeletal:     Right lower leg: Edema present.     Left lower leg: Edema present.     Comments: Bilateral 2+ lower extremity pitting edema to the knees.  Slightly greater on the left than right.  Skin:    General: Skin is warm and dry.     Findings: No erythema or rash.  Neurological:     Mental Status: He is alert.     Coordination: Coordination normal.     Gait: Gait normal.  Psychiatric:        Behavior: Behavior normal.        Thought Content: Thought content normal.        Judgment: Judgment normal.     Lab Review:     Component Value Date/Time   NA 139 07/19/2020 0830   K 3.3 (L) 07/19/2020 0830   CL 107 07/19/2020 0830   CO2 22 07/19/2020 0830   GLUCOSE 102 (H) 07/19/2020 0830   BUN 23 07/19/2020 0830   CREATININE 1.52 (H) 07/19/2020 0830   CALCIUM 9.4 07/19/2020 0830   PROT 6.2 (L) 07/19/2020 0830   PROT 6.1 03/24/2020 1110   ALBUMIN 3.1 (L) 07/19/2020 0830   ALBUMIN 4.1 03/24/2020 1110   AST 32 07/19/2020 0830   ALT 15 07/19/2020 0830   ALKPHOS 54 07/19/2020 0830   BILITOT 0.9 07/19/2020 0830   GFRNONAA 45 (L) 07/19/2020 0830   GFRAA 52 (L) 07/19/2020 0830       Component Value Date/Time   WBC 3.5 (L) 07/19/2020 0830   WBC 6.9 05/22/2020 0615   RBC 2.29 (L)  07/19/2020 0830   HGB 8.4 (L) 07/19/2020 0830   HCT 25.8 (L) 07/19/2020 0830   PLT 216 07/19/2020 0830  MCV 112.7 (H) 07/19/2020 0830   MCH 36.7 (H) 07/19/2020 0830   MCHC 32.6 07/19/2020 0830   RDW 18.3 (H) 07/19/2020 0830   LYMPHSABS 1.0 07/19/2020 0830   MONOABS 0.9 07/19/2020 0830   EOSABS 0.0 07/19/2020 0830   BASOSABS 0.1 07/19/2020 0830   -------------------------------  Imaging from last 24 hours (if applicable):  Radiology interpretation: CT Chest Wo Contrast  Result Date: 07/06/2020 CLINICAL DATA:  History of head and neck cancer. Assess treatment response. EXAM: CT CHEST WITHOUT CONTRAST TECHNIQUE: Multidetector CT imaging of the chest was performed following the standard protocol without IV contrast. COMPARISON:  CT scan 04/16/2020 FINDINGS: Cardiovascular: The heart is normal in size. Small stable pericardial effusion. Stable tortuosity, mild ectasia and mild calcification of the thoracic aorta. Stable advanced three-vessel coronary artery calcifications. The right power port tip is in good position in the distal SVC. Mediastinum/Nodes: Small scattered mediastinal and hilar lymph nodes but no mass or adenopathy. The esophagus is grossly normal. Lungs/Pleura: Persistent/stable moderate-sized bilateral pleural effusions. Minimal overlying atelectasis. Small nodules in the right lung are stable. 6 mm nodule in the right lower lobe on image number 86. 2.5 mm right middle lobe nodule on image 97/7 is stable. 3.5 mm right middle lobe nodule on image 118/7 is stable. Stable calcified granuloma in the left lower lobe on image 92/7. Upper Abdomen: Stable vascular calcifications.  Stable gallstones. Musculoskeletal: No significant bony findings. IMPRESSION: 1. Persistent/stable moderate-sized bilateral pleural effusions with overlying atelectasis. 2. Stable small right pulmonary nodules. No new or progressive findings. Recommend continued surveillance. 3. No mediastinal or hilar mass or  adenopathy. 4. Stable advanced three-vessel coronary artery calcifications. 5. Cholelithiasis. Aortic Atherosclerosis (ICD10-I70.0) Electronically Signed   By: Marijo Sanes M.D.   On: 07/06/2020 13:36   MR Abdomen W Wo Contrast  Result Date: 07/06/2020 CLINICAL DATA:  Follow-up cholangiocarcinoma. EXAM: MRI ABDOMEN WITHOUT AND WITH CONTRAST TECHNIQUE: Multiplanar multisequence MR imaging of the abdomen was performed both before and after the administration of intravenous contrast. CONTRAST:  89mL GADAVIST GADOBUTROL 1 MMOL/ML IV SOLN COMPARISON:  MRI abdomen 07/31/2019 and CT scan 12/24/2019 FINDINGS: Lower chest: The lung bases are clear of an acute process. No pulmonary lesions or pleural or pericardial effusion. Hepatobiliary: There is a diffusion positive lesion again demonstrated in the right hepatic lobe in the medial aspect of segment 7. Lesion is faintly visualized on the T1 and T2 weighted images and is most apparent on the delayed post-contrast images. The lesion measures approximately 3.9 x 2.5 cm on image 26/17. It previously measured 6.6 x 4.3 cm on the prior study on image 35/905. Findings would suggest a good response to treatment with some contraction of the tumor. There is persistent mild intrahepatic biliary dilatation in the right hepatic lobe distal to the lesion. No new hepatic lesions are identified. On the delayed images some of the hepatic and portal vein radicles have a somewhat tortuous irregular and almost beaded appearance. Findings could be radiation related. No change since CT scan of 10/09/2019. The gallbladder demonstrates small gallstones but no findings for acute cholecystitis. Normal caliber and course of the common bile duct. Pancreas:  No mass, inflammation or ductal dilatation. Spleen:  Normal size.  No focal lesions. Adrenals/Urinary Tract: The adrenal glands and kidneys are unremarkable. Small renal cysts are stable. Stomach/Bowel: The stomach, duodenum, visualized small  bowel and visualized colon are unremarkable. Vascular/Lymphatic: The aorta and branch vessels are patent. Moderate atherosclerotic calcifications. The major venous structures are patent. No mesenteric  or retroperitoneal mass or adenopathy. Other:  No ascites or abdominal wall hernia. Musculoskeletal: No significant bony findings. Stable L4 hemangioma. IMPRESSION: 1. Interval decrease in size of the right hepatic lobe lesion in the medial aspect of the segment 7. Findings would suggest a good response to treatment with some contraction of the tumor. 2. No new hepatic lesions. No abdominal adenopathy or metastatic disease. 3. Stable mild intrahepatic biliary dilatation in the right hepatic lobe distal to the lesion. 4. Somewhat tortuous and almost beaded appearance of the hepatic and portal vein radicles. Findings could be radiation related. 5. Cholelithiasis. Electronically Signed   By: Marijo Sanes M.D.   On: 07/06/2020 13:50   DG Chest Bilateral Decubitus  Result Date: 07/19/2020 CLINICAL DATA:  History of pleural effusion. EXAM: CHEST - BILATERAL DECUBITUS VIEW COMPARISON:  05/20/2020 FINDINGS: Right chest wall port a catheter tip is in the distal SVC. Unchanged cardiac enlargement. Right side down lateral decubitus radiograph shows a moderate size free-flowing right pleural effusion. Increase interlobular septal thickening is identified compatible with mild pulmonary edema. No airspace consolidation. IMPRESSION: 1. Moderate size free-flowing right pleural effusion and mild interstitial edema Electronically Signed   By: Kerby Moors M.D.   On: 07/19/2020 12:01   IR Radiologist Eval & Mgmt  Result Date: 07/14/2020 Please refer to notes tab for details about interventional procedure. (Op Note)

## 2020-07-22 NOTE — Telephone Encounter (Signed)
These prescriptions have been sent to his pharmacy.

## 2020-07-22 NOTE — Telephone Encounter (Signed)
Called and spoke to the patient. He was seen by Sandi Mealy, PA with Oncology on 8/30 and a CXR was ordered and he was instructed to increase his lasix to 40 mg TID d/t LEE. The patient was previously taking 40 mg BID (chart says QD). They are requesting an RX for the furosemide be sent to the pharmacy as he is about to run out. The patient had labs done on 8/30 as well showing Cr- 1.52 and K was low at 3.3. The patient is not on a potassium supplement. Made patient aware that I will forward to Dr. Gwenlyn Found and PA Tanner for review and recommendation.

## 2020-07-22 NOTE — Telephone Encounter (Signed)
Patient's wife is returning call. 

## 2020-07-27 ENCOUNTER — Other Ambulatory Visit (HOSPITAL_COMMUNITY): Payer: Self-pay | Admitting: Interventional Radiology

## 2020-07-27 DIAGNOSIS — C221 Intrahepatic bile duct carcinoma: Secondary | ICD-10-CM

## 2020-07-28 ENCOUNTER — Other Ambulatory Visit (HOSPITAL_COMMUNITY): Payer: Self-pay | Admitting: Interventional Radiology

## 2020-07-28 DIAGNOSIS — N182 Chronic kidney disease, stage 2 (mild): Secondary | ICD-10-CM | POA: Diagnosis not present

## 2020-07-28 DIAGNOSIS — Z6831 Body mass index (BMI) 31.0-31.9, adult: Secondary | ICD-10-CM | POA: Diagnosis not present

## 2020-07-28 DIAGNOSIS — M109 Gout, unspecified: Secondary | ICD-10-CM | POA: Diagnosis not present

## 2020-07-28 DIAGNOSIS — E1122 Type 2 diabetes mellitus with diabetic chronic kidney disease: Secondary | ICD-10-CM | POA: Diagnosis not present

## 2020-07-28 DIAGNOSIS — L0390 Cellulitis, unspecified: Secondary | ICD-10-CM | POA: Diagnosis not present

## 2020-07-28 DIAGNOSIS — C221 Intrahepatic bile duct carcinoma: Secondary | ICD-10-CM

## 2020-07-28 DIAGNOSIS — I509 Heart failure, unspecified: Secondary | ICD-10-CM | POA: Diagnosis not present

## 2020-07-28 DIAGNOSIS — I129 Hypertensive chronic kidney disease with stage 1 through stage 4 chronic kidney disease, or unspecified chronic kidney disease: Secondary | ICD-10-CM | POA: Diagnosis not present

## 2020-08-02 ENCOUNTER — Ambulatory Visit: Payer: Medicare HMO

## 2020-08-02 ENCOUNTER — Inpatient Hospital Stay: Payer: Medicare HMO

## 2020-08-02 ENCOUNTER — Inpatient Hospital Stay: Payer: Medicare HMO | Admitting: Hematology

## 2020-08-02 ENCOUNTER — Inpatient Hospital Stay: Payer: Medicare HMO | Attending: Hematology

## 2020-08-02 ENCOUNTER — Telehealth: Payer: Self-pay | Admitting: Hematology

## 2020-08-02 NOTE — Telephone Encounter (Signed)
Called pt per 9/12 sch msg - no answer . Left message for patient with appt date and time

## 2020-08-09 ENCOUNTER — Other Ambulatory Visit: Payer: Self-pay | Admitting: Radiology

## 2020-08-09 ENCOUNTER — Ambulatory Visit: Payer: Medicare HMO

## 2020-08-09 ENCOUNTER — Other Ambulatory Visit: Payer: Medicare HMO

## 2020-08-10 ENCOUNTER — Other Ambulatory Visit: Payer: Self-pay | Admitting: Radiology

## 2020-08-10 NOTE — H&P (Signed)
Chief Complaint: Patient was seen in consultation today for intrahepatic cholangiocarcinoma  Referring Physician(s): Dr. Burr Medico  Supervising Physician: Jacqulynn Cadet  Patient Status: [facility] - Out-pt  History of Present Illness: Brandon Sherman is a 74 y.o. 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 He struggled with GERD for many years and it had gotten progressively worse and was unresponsive to medications. He then developed right-sided pain that radiated to his back, along with weight loss. Imaging revealed a 7.3 cm right hepatic lobe mass and pathology (IR biopsy 08/11/2019) was positive for adenocarcinoma. Given its central location and portal vein invasion, this tumor was deemed non-resectable with surgical interventions. He started chemotherapy 09/2019 and he had a port-a-catheter placed by IR on February 23, 2020. Cisplatin was held beginning May 2021 secondary to atrial fibrillation and congestive heart failure. He is currently on maintenance gemcitabine, last dose 07/12/20.   He met with Dr. Laurence Ferrari via a tele-visit 07/14/20 to discuss possibilities for liver directed therapy. After a thorough discussion with [name] and Dr. Burr Medico there was agreement to proceed with transarterial radio embolization with a attempted segmentectomy of the hepatic segment containing the lesion.   Past Medical History:  Diagnosis Date  . Arthritis   . Diabetes ([place])   . GERD (gastroesophageal reflux disease)   . Hyperlipidemia   . Hypertension   . Intrahepatic cholangiocarcinoma [facility])     Past Surgical History:  Procedure Laterality Date  . APPENDECTOMY  1980  . CARDIOVERSION N/A 04/27/2020   Procedure: CARDIOVERSION;  Surgeon: Dorothy Spark, MD;  Location: [facility] ENDOSCOPY;  Service: Cardiovascular;  Laterality: N/A;  . CARDIOVERSION N/A 05/19/2020   Procedure: CARDIOVERSION;  Surgeon: Elouise Munroe, MD;   Location: [facility] ENDOSCOPY;  Service: Cardiovascular;  Laterality: N/A;  . COLONOSCOPY    . IR IMAGING GUIDED PORT INSERTION  02/23/2020  . IR RADIOLOGIST EVAL & MGMT  07/14/2020    Allergies: Patient has no known allergies.  Medications: Prior to Admission medications   Medication Sig Start Date End Date Taking? Authorizing Provider  allopurinol (ZYLOPRIM) 100 MG tablet Take 100 mg by mouth daily.    [provider]  apixaban (ELIQUIS) 5 MG TABS tablet Take 1 tablet (5 mg total) by mouth 2 (two) times daily. 03/24/20   Lorretta Harp, MD  colchicine 0.6 MG tablet Take 1 tablet (0.6 mg total) by mouth daily as needed (gout flare). Take daily for 3 days, then as needed for gout flare 05/22/20 06/21/20  Rai, Vernelle Emerald, MD  diltiazem (CARDIZEM CD) 180 MG 24 hr capsule Take 1 capsule (180 mg total) by mouth daily. 05/11/20 08/09/20  Lorretta Harp, MD  fenofibrate (TRICOR) 145 MG tablet Take 145 mg by mouth daily.    [provider]  finasteride (PROSCAR) 5 MG tablet Take 5 mg by mouth daily.    [provider]  furosemide (LASIX) 40 MG tablet 40 mg PO TID 07/22/20   Harle Stanford., PA-C  hydrALAZINE (APRESOLINE) 50 MG tablet Take 1 tablet (50 mg total) by mouth in the morning and at bedtime. 06/01/20 08/30/20  Lorretta Harp, MD  lidocaine-prilocaine (EMLA) cream Apply 1 application topically as needed. 03/29/20   Alla Feeling, NP  magnesium oxide (MAG-OX) 400 MG tablet Take 1 tablet (400 mg total) by mouth daily. 05/31/20   [name], MD  metFORMIN (GLUCOPHAGE) 500 MG tablet Take 500 mg by mouth 2 (two) times daily  with a meal.     [provider]  metoprolol succinate (TOPROL XL) 25 MG 24 hr tablet Take 1 tablet (25 mg total) by mouth daily. 06/23/20   Erlene Quan, PA-C  ondansetron (ZOFRAN) 8 MG tablet Take 1 tablet (8 mg total) by mouth 2 (two) times daily as needed. Start on the third day after chemotherapy. 05/03/20   [name], MD  pantoprazole (PROTONIX) 40  MG tablet Take 1 tablet (40 mg total) by mouth 2 (two) times daily. 05/03/20   [name], MD  potassium chloride SA (KLOR-CON) 20 MEQ tablet Take 1 tablet (20 mEq total) by mouth 2 (two) times daily. 07/22/20   Tanner, Lyndon Code., PA-C  pravastatin (PRAVACHOL) 40 MG tablet Take 40 mg by mouth daily.    [provider]  sucralfate (CARAFATE) 1 g tablet Take 1 tablet (1 g total) by mouth every 6 (six) hours as needed. Please schedule a yearly follow up for further refills: [phone] 06/15/20   Yetta Flock, MD  tamsulosin (FLOMAX) 0.4 MG CAPS capsule Take 0.4 mg by mouth daily.    [provider]  Vitamin D, Ergocalciferol, (DRISDOL) 1.25 MG (50000 UT) CAPS capsule Take 50,000 Units by mouth every 7 (seven) days.    [provider]     Family History  Problem Relation Age of Onset  . Heart attack Mother   . Stroke Father   . Colon cancer Neg Hx   . Esophageal cancer Neg Hx   . Stomach cancer Neg Hx   . Rectal cancer Neg Hx   . Colon polyps Neg Hx     Social History   Socioeconomic History  . Marital status: Married    Spouse name: Not on file  . Number of children: 3  . Years of education: Not on file  . Highest education level: Not on file  Occupational History  . Not on file  Tobacco Use  . Smoking status: Never Smoker  . Smokeless tobacco: Current User    Types: Chew  . Tobacco comment: for 60 years, 1-2 daily   Vaping Use  . Vaping Use: Never used  Substance and Sexual Activity  . Alcohol use: Not Currently    Comment: occasionally  . Drug use: Never  . Sexual activity: Not on file  Other Topics Concern  . Not on file  Social History Narrative  . Not on file   Social Determinants of Health   Financial Resource Strain:   . Difficulty of Paying Living Expenses: Not on file  Food Insecurity:   . Worried About Charity fundraiser in the Last Year: Not on file  . Ran Out of Food in the Last Year: Not on file  Transportation Needs: No  Transportation Needs  . Lack of Transportation (Medical): No  . Lack of Transportation (Non-Medical): No  Physical Activity: Insufficiently Active  . Days of Exercise per Week: 1 day  . Minutes of Exercise per Session: 50 min  Stress:   . Feeling of Stress : Not on file  Social Connections: Unknown  . Frequency of Communication with Friends and Family: More than three times a week  . Frequency of Social Gatherings with Friends and Family: More than three times a week  . Attends Religious Services: Not on file  . Active Member of Clubs or Organizations: Not on file  . Attends Archivist Meetings: Not on file  . Marital Status: Married    Review of  Systems: A 12 point ROS discussed and pertinent positives are indicated in the HPI above.  All other systems are negative.  Review of Systems  Constitutional: Negative for appetite change and fatigue.  Respiratory: Negative for cough and shortness of breath.   Cardiovascular: Positive for leg swelling. Negative for chest pain.  Gastrointestinal: Negative for abdominal pain, diarrhea, nausea and vomiting.  Musculoskeletal: Negative for back pain.  Neurological: Negative for headaches.  Hematological: Bruises/bleeds easily.       From blood thinner    Vital Signs: BP (!) 153/82 (BP Location: Left Arm)   Pulse 83   Temp 98.3 F (36.8 C) (Oral)   Resp 17   SpO2 97%   Physical Exam Constitutional:      General: He is not in acute distress.    Appearance: Normal appearance.  HENT:     Mouth/Throat:     Mouth: Mucous membranes are moist.     Pharynx: Oropharynx is clear.  Cardiovascular:     Rate and Rhythm: Normal rate. Rhythm irregular.     Pulses: Normal pulses.     Heart sounds: Normal heart sounds.     Comments: Right chest port-a-catheter, currently accessed.  Pulmonary:     Effort: Pulmonary effort is normal.     Breath sounds: Normal breath sounds.  Abdominal:     General: Bowel sounds are normal.      Palpations: Abdomen is soft.     Tenderness: There is no abdominal tenderness.  Musculoskeletal:        General: Normal range of motion.     Cervical back: Normal range of motion.     Right lower leg: Edema present.     Left lower leg: Edema present.     Comments: +3 bilateral lower extremity edema.   Skin:    General: Skin is warm and dry.     Comments: Scattered scabs and bruises.   Neurological:     Mental Status: He is alert and oriented to person, place, and time.  Psychiatric:        Mood and Affect: Mood normal.        Behavior: Behavior normal.        Thought Content: Thought content normal.        Judgment: Judgment normal.     Imaging: DG Chest Bilateral Decubitus  Result Date: 07/19/2020 CLINICAL DATA:  History of pleural effusion. EXAM: CHEST - BILATERAL DECUBITUS VIEW COMPARISON:  05/20/2020 FINDINGS: Right chest wall port a catheter tip is in the distal SVC. Unchanged cardiac enlargement. Right side down lateral decubitus radiograph shows a moderate size free-flowing right pleural effusion. Increase interlobular septal thickening is identified compatible with mild pulmonary edema. No airspace consolidation. IMPRESSION: 1. Moderate size free-flowing right pleural effusion and mild interstitial edema Electronically Signed   By: Kerby Moors M.D.   On: 07/19/2020 12:01   IR Radiologist Eval & Mgmt  Result Date: 07/14/2020 Please refer to notes tab for details about interventional procedure. (Op Note)   Labs:  CBC: Recent Labs    06/28/20 0847 07/12/20 0815 07/19/20 0830 08/11/20 0814  WBC 3.2* 5.9 3.5* 6.0  HGB 8.8* 8.9* 8.4* 10.0*  HCT 26.5* 27.2* 25.8* 30.8*  PLT 203 224 216 267    COAGS: Recent Labs    02/23/20 1026  INR 1.1    BMP: Recent Labs    06/21/20 0825 06/28/20 0847 07/12/20 0815 07/19/20 0830  NA 140 138 141 139  K 3.9 4.0 3.6 3.3*  CL 107 108 108 107  CO2 _0 GLUCOSE 98 102* 97 102*  BUN 19 26* 27* 23  CALCIUM 9.6  9.6 9.9 9.4  CREATININE 1.45* 1.69* 1.65* 1.52*  GFRNONAA 48* 40* 41* 45*  GFRAA 55* 46* 47* 52*    LIVER FUNCTION TESTS: Recent Labs    06/21/20 0825 06/28/20 0847 07/12/20 0815 07/19/20 0830  BILITOT 0.6 0.6 0.7 0.9  AST _1 32  ALT _2 ALKPHOS 40 48 43 54  PROT 6.2* 6.2* 6.1* 6.2*  ALBUMIN 3.5 3.2* 3.3* 3.1*    TUMOR MARKERS: No results for input(s): AFPTM, CEA, CA199, CHROMGRNA in the last 8760 hours.  Assessment and Plan:  Intrahepatic cholangiocarcinoma: Brandon Ginger. Sherman, 74 year old male, presents today to the [facility] department for a pre Y-90 embolization arterial mapping.   Risks and benefits discussed with the patient including, but not limited to bleeding, infection, vascular injury, post procedural pain, nausea, vomiting and fatigue, contrast induced renal failure, liver failure, radiation injury to the bowel, radiation induced cholecystitis, neutropenia and possible need for additional procedures.  All of the patient's questions were answered, patient is agreeable to proceed.  The patient has been NPO. Vitals have been reviewed. Some labs are still pending but will be reviewed prior to the start of the procedure. Mr. Epple has not taken his Eliquis since Sunday, 08/08/20.   Consent signed and in chart.  Thank you for this interesting consult.  I greatly enjoyed meeting Brandon Sherman and look forward to participating in their care.  A copy of this report was sent to the requesting provider on this date.  Electronically Signed: Soyla Dryer, AGACNP-BC [phone] 08/11/2020, 8:42 AM   I spent a total of  30 Minutes   in face to face in clinical consultation, greater than 50% of which was counseling/coordinating care for pre Y-90 embolization arterial planning.

## 2020-08-11 ENCOUNTER — Ambulatory Visit (HOSPITAL_COMMUNITY)
Admission: RE | Admit: 2020-08-11 | Discharge: 2020-08-11 | Disposition: A | Discharge status: Home / Self Care | Payer: Medicare HMO | Source: Ambulatory Visit | Attending: Interventional Radiology | Admitting: Interventional Radiology

## 2020-08-11 ENCOUNTER — Other Ambulatory Visit: Payer: Self-pay

## 2020-08-11 ENCOUNTER — Other Ambulatory Visit (HOSPITAL_COMMUNITY): Payer: Self-pay | Admitting: Interventional Radiology

## 2020-08-11 ENCOUNTER — Encounter (HOSPITAL_COMMUNITY): Payer: Self-pay

## 2020-08-11 ENCOUNTER — Encounter (HOSPITAL_COMMUNITY)
Admission: RE | Admit: 2020-08-11 | Discharge: 2020-08-11 | Disposition: A | Discharge status: Home / Self Care | Payer: Medicare HMO | Source: Ambulatory Visit | Attending: Interventional Radiology | Admitting: Interventional Radiology

## 2020-08-11 DIAGNOSIS — C221 Intrahepatic bile duct carcinoma: Secondary | ICD-10-CM

## 2020-08-11 DIAGNOSIS — Z79899 Other long term (current) drug therapy: Secondary | ICD-10-CM | POA: Diagnosis not present

## 2020-08-11 DIAGNOSIS — E785 Hyperlipidemia, unspecified: Secondary | ICD-10-CM | POA: Diagnosis not present

## 2020-08-11 DIAGNOSIS — F17290 Nicotine dependence, other tobacco product, uncomplicated: Secondary | ICD-10-CM | POA: Diagnosis not present

## 2020-08-11 DIAGNOSIS — I509 Heart failure, unspecified: Secondary | ICD-10-CM | POA: Insufficient documentation

## 2020-08-11 DIAGNOSIS — I110 Hypertensive heart disease with heart failure: Secondary | ICD-10-CM | POA: Insufficient documentation

## 2020-08-11 DIAGNOSIS — Z7901 Long term (current) use of anticoagulants: Secondary | ICD-10-CM | POA: Insufficient documentation

## 2020-08-11 DIAGNOSIS — K219 Gastro-esophageal reflux disease without esophagitis: Secondary | ICD-10-CM | POA: Insufficient documentation

## 2020-08-11 DIAGNOSIS — E119 Type 2 diabetes mellitus without complications: Secondary | ICD-10-CM | POA: Insufficient documentation

## 2020-08-11 DIAGNOSIS — R935 Abnormal findings on diagnostic imaging of other abdominal regions, including retroperitoneum: Secondary | ICD-10-CM | POA: Diagnosis not present

## 2020-08-11 HISTORY — PX: IR 3D INDEPENDENT WKST: IMG2385

## 2020-08-11 HISTORY — PX: IR US GUIDE VASC ACCESS RIGHT: IMG2390

## 2020-08-11 HISTORY — PX: IR EMBO ARTERIAL NOT HEMORR HEMANG INC GUIDE ROADMAPPING: IMG5448

## 2020-08-11 HISTORY — PX: IR ANGIOGRAM VISCERAL SELECTIVE: IMG657

## 2020-08-11 HISTORY — PX: IR ANGIOGRAM SELECTIVE EACH ADDITIONAL VESSEL: IMG667

## 2020-08-11 LAB — COMPREHENSIVE METABOLIC PANEL
ALT: 13 U/L (ref 0–44)
AST: 23 U/L (ref 15–41)
Albumin: 3.7 g/dL (ref 3.5–5.0)
Alkaline Phosphatase: 31 U/L — ABNORMAL LOW (ref 38–126)
Anion gap: 8 (ref 5–15)
BUN: 30 mg/dL — ABNORMAL HIGH (ref 8–23)
CO2: 23 mmol/L (ref 22–32)
Calcium: 8.3 mg/dL — ABNORMAL LOW (ref 8.9–10.3)
Chloride: 107 mmol/L (ref 98–111)
Creatinine, Ser: 1.8 mg/dL — ABNORMAL HIGH (ref 0.61–1.24)
GFR calc Af Amer: 43 mL/min — ABNORMAL LOW (ref >60)
GFR calc non Af Amer: 37 mL/min — ABNORMAL LOW (ref >60)
Glucose, Bld: 98 mg/dL (ref 70–99)
Potassium: 4.1 mmol/L (ref 3.5–5.1)
Sodium: 138 mmol/L (ref 135–145)
Total Bilirubin: 0.6 mg/dL (ref 0.3–1.2)
Total Protein: 6.2 g/dL — ABNORMAL LOW (ref 6.5–8.1)

## 2020-08-11 LAB — CBC
HCT: 30.8 % — ABNORMAL LOW (ref 39.0–52.0)
Hemoglobin: 10 g/dL — ABNORMAL LOW (ref 13.0–17.0)
MCH: 37.2 pg — ABNORMAL HIGH (ref 26.0–34.0)
MCHC: 32.5 g/dL (ref 30.0–36.0)
MCV: 114.5 fL — ABNORMAL HIGH (ref 80.0–100.0)
Platelets: 267 10*3/uL (ref 150–400)
RBC: 2.69 MIL/uL — ABNORMAL LOW (ref 4.22–5.81)
RDW: 17.5 % — ABNORMAL HIGH (ref 11.5–15.5)
WBC: 6 10*3/uL (ref 4.0–10.5)
nRBC: 0 % (ref 0.0–0.2)

## 2020-08-11 LAB — PROTIME-INR
INR: 1.1 (ref 0.8–1.2)
Prothrombin Time: 13.9 seconds (ref 11.4–15.2)

## 2020-08-11 LAB — GLUCOSE, CAPILLARY: Glucose-Capillary: 95 mg/dL (ref 70–99)

## 2020-08-11 IMAGING — NM NM MISC PROCEDURE
3 series · 18 of 18 positions shown · non-contrast
Comparison: MRI [DATE].

CLINICAL DATA: Cholangiocarcinoma. Pre Y-90 radioembolization
imaging.

EXAM:
NUCLEAR MEDICINE LIVER SCAN; ULTRASOUND MISCELLANEOUS SOFT TISSUE
TECHNIQUE: Abdominal images were obtained in multiple projections after
intrahepatic arterial injection of radiopharmaceutical. SPECT
imaging was performed. Lung shunt calculation was performed.
RADIOPHARMACEUTICALS:  [GR] MAA TECHNETIUM TO 99M ALBUMIN
AGGREGATED

[Series 1: spect - (id) _(id)_cor · 4.1mm · 4.14mm/px · 6 of 128 frames shown]
[frame 11/128]
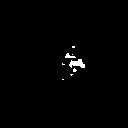
[frame 32/128]
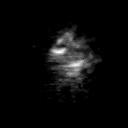
[frame 54/128]
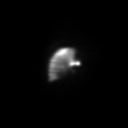
[frame 75/128]
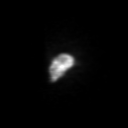
[frame 96/128]
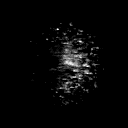
[frame 118/128]
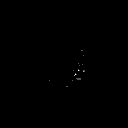

[Series 1: spect - (id) _(id)_tra · 4.1mm · 4.14mm/px · 6 of 128 frames shown]
[frame 11/128]
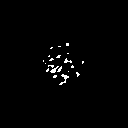
[frame 32/128]
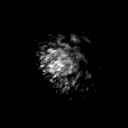
[frame 54/128]
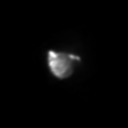
[frame 75/128]
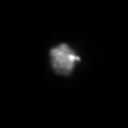
[frame 96/128]
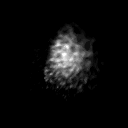
[frame 118/128]
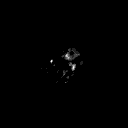

[Series 2: maa spect · 4.14mm/px · 6 of 64 frames shown]
[frame 6/64]
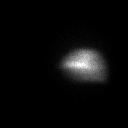
[frame 16/64]
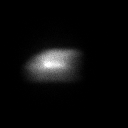
[frame 27/64]
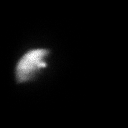
[frame 38/64]
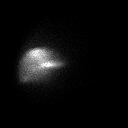
[frame 48/64]
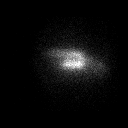
[frame 59/64]
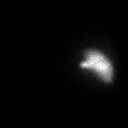

[18 of 18 positions shown; findings below may reference images not displayed]

FINDINGS: The injected microaggregated albumin localizes within the right
hepatic lobe with prominent uptake noted in the caudate lobe. No
evidence of activity within the stomach, duodenum, or bowel.

Calculated shunt fraction to the lungs equals 5.6%.
IMPRESSION: 1. No significant extrahepatic radiotracer activity following
intrahepatic arterial injection of MAA.
2. Lung shunt fraction equals 5.6%

## 2020-08-11 IMAGING — XA IR 3D RECON INDEPENDENT WKST
11 of 22 series · 11 of 24 positions shown · IV contrast (IODINE)
Comparison: none

INDICATION: 74-year-old male with intrahepatic cholangiocarcinoma involving the
medial aspect of hepatic segment 7. He presents for pre [AGE] mapping
study to evaluate for possible [AGE] radioembolization.

[Series 1: (id) · 1 of 1 slices shown]
[im 1/1]
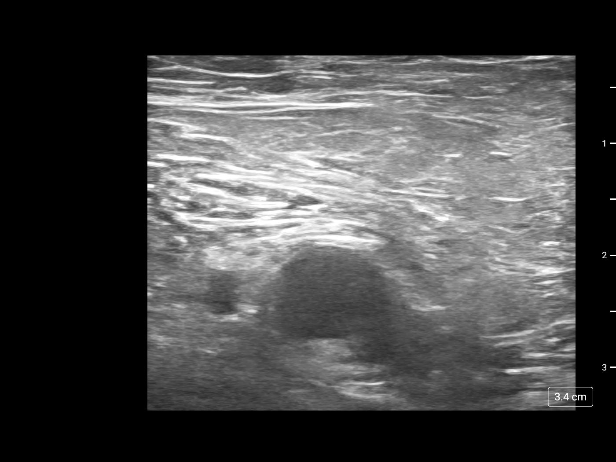

[Series 4: care body 4 · 1 of 49 frames shown (1 of 6)]
[frame 8/49]
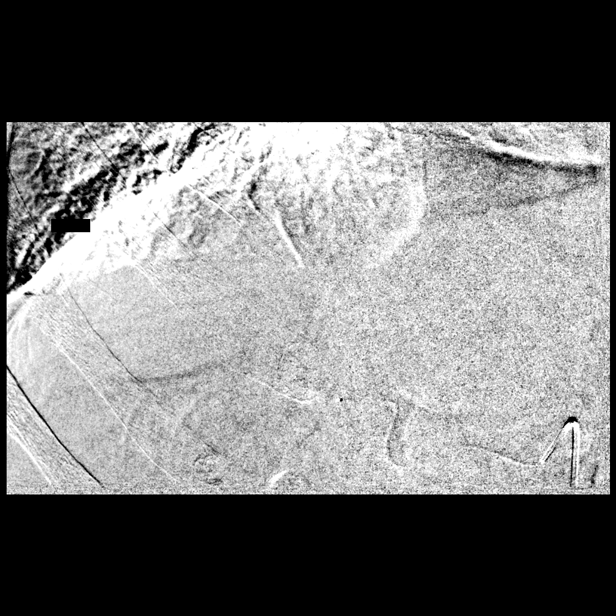

[Series 6: care body 4 · 1 of 43 frames shown (2 of 6)]
[frame 7/43]
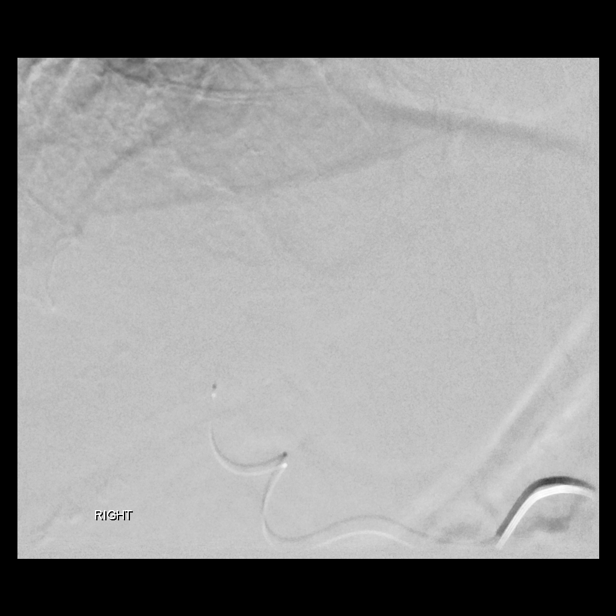

[Series 7: care body 4 · 1 of 36 frames shown (3 of 6)]
[frame 35/36]
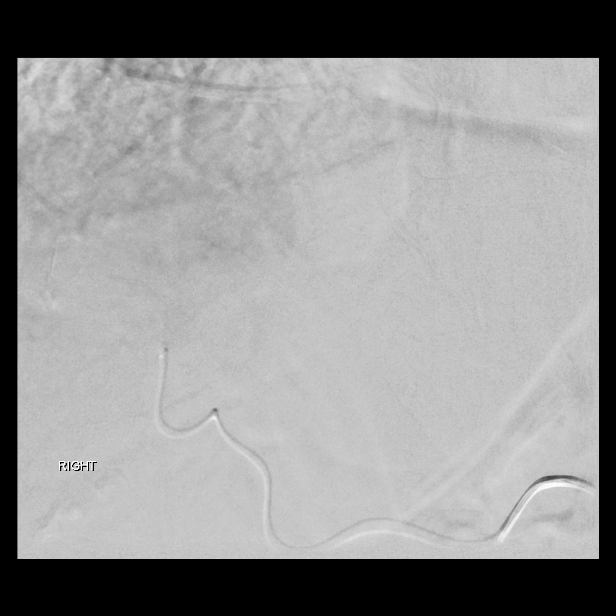

[Series 10: care body 4 · 1 of 30 frames shown (4 of 6)]
[frame 2/30]
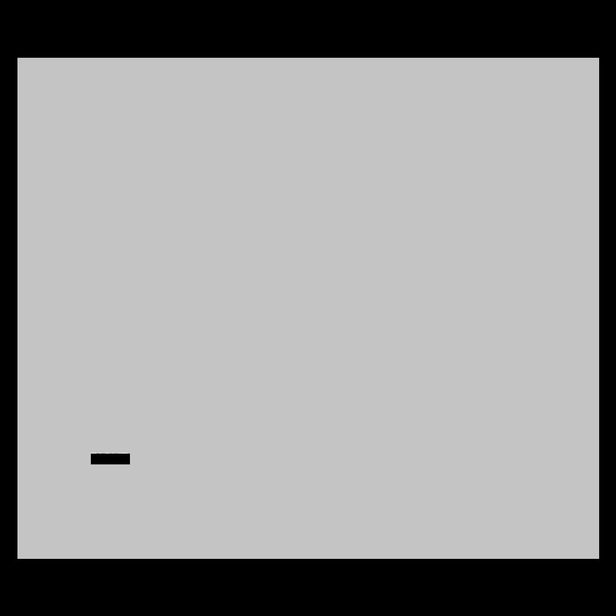

[Series 13: care body 4 · 1 of 25 frames shown (5 of 6)]
[frame 4/25]
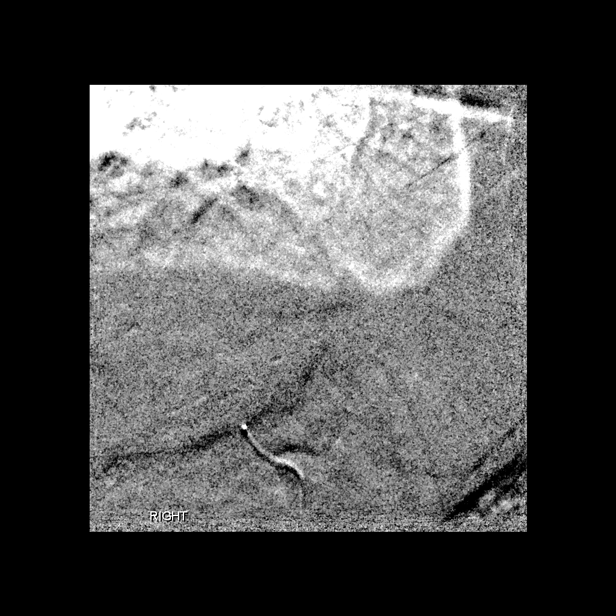

[Series 15: (id) body care · 1 of 123 frames shown]
[frame 19/123]
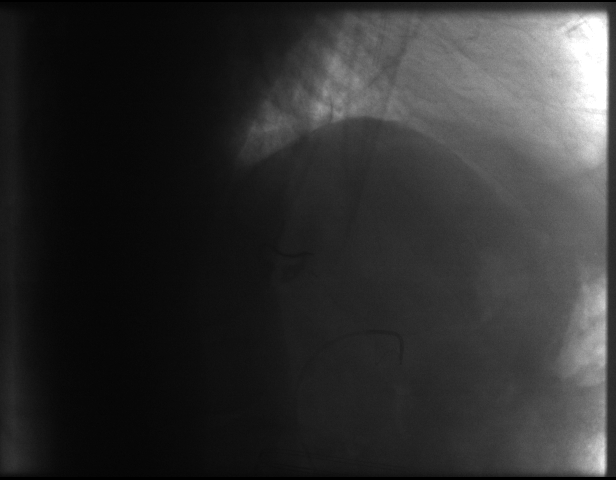

[Series 17: body 4 · 1 of 26 frames shown (1 of 3)]
[frame 4/26]
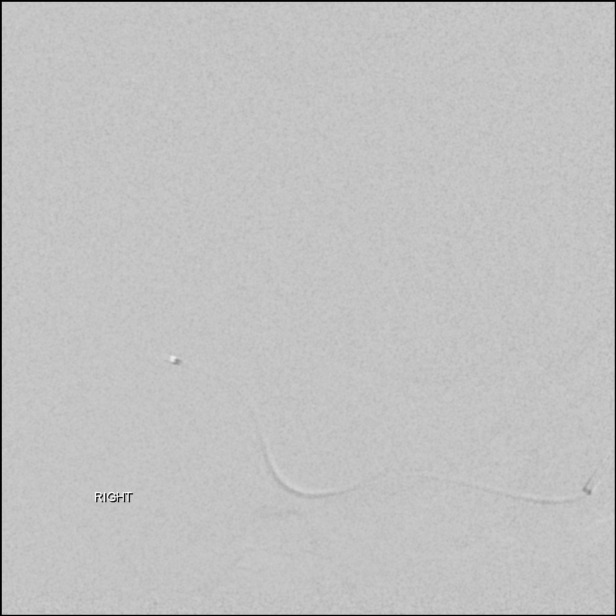

[Series 18: body 4 · 1 of 27 frames shown (2 of 3)]
[frame 23/27]
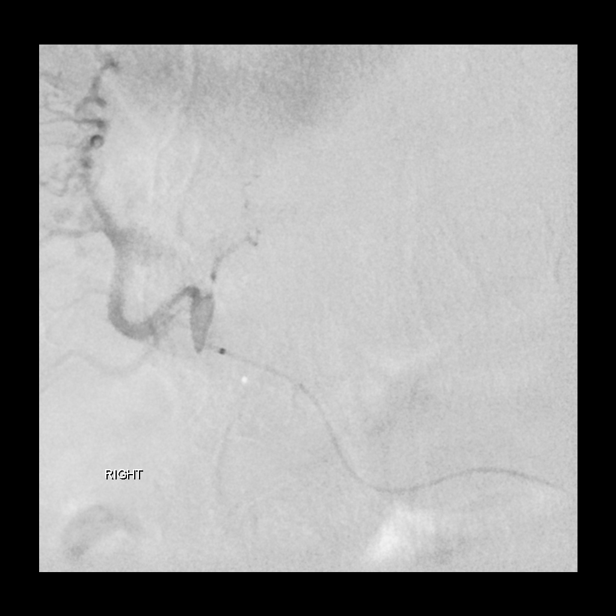

[Series 20: body 4 · 1 of 22 frames shown (3 of 3)]
[frame 19/22]
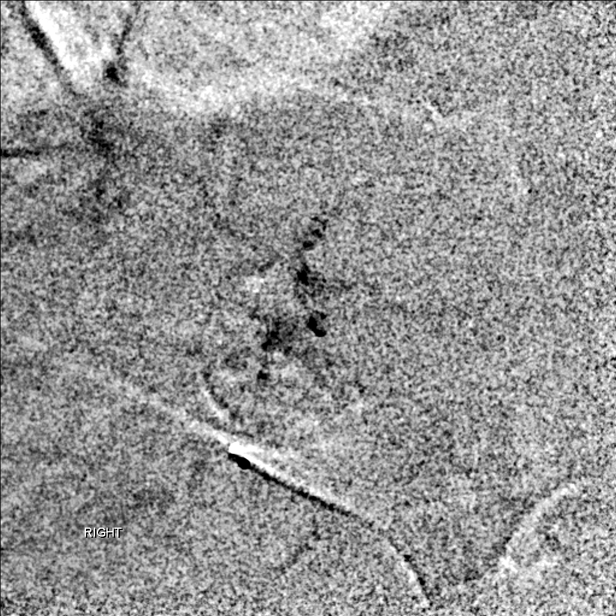

[Series 22: care body 4 · 1 of 33 frames shown (6 of 6)]
[frame 29/33]
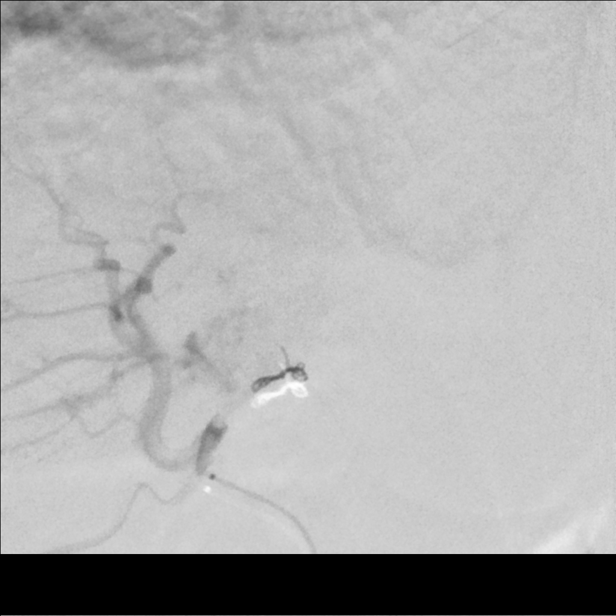

[11 of 24 positions shown; findings below may reference images not displayed]

EXAM:
IR ULTRASOUND GUIDANCE VASC ACCESS RIGHT; WORKSTATION 3D
RECONSTRUCTION; SELECTIVE VISCERAL ARTERIOGRAPHY; ADDITIONAL
ARTERIOGRAPHY; IR EMBO ARTERIAL NOT HEMORR HANKES INC GUIDE
ROADMAPPING

MEDICATIONS:
None.

ANESTHESIA/SEDATION:
Moderate (conscious) sedation was employed during this procedure. A
total of Versed 4 mg and Fentanyl 100 mcg was administered
intravenously.

Moderate Sedation Time: 66 minutes. The patient's level of
consciousness and vital signs were monitored continuously by
radiology nursing throughout the procedure under my direct
supervision.

CONTRAST:  8mL VISIPAQUE IODIXANOL 320 MG/ML IV SOLN, 8mL VISIPAQUE
IODIXANOL 320 MG/ML IV SOLN, 8mL VISIPAQUE IODIXANOL 320 MG/ML IV
SOLN, 25mL VISIPAQUE IODIXANOL 320 MG/ML IV SOLN, 30mL VISIPAQUE
IODIXANOL 320 MG/ML IV SOLN

FLUOROSCOPY TIME:  Fluoroscopy Time: 13 minutes 0 seconds ([T3]
mGy).

COMPLICATIONS:
None immediate.



The right common femoral artery was interrogated with ultrasound and
found to be widely patent. An image was obtained and stored for the
medical record. Local anesthesia was attained by infiltration with
1% lidocaine. A small dermatotomy was made. Under real-time
sonographic guidance, the vessel was punctured with a 21 gauge
micropuncture needle. Using standard technique, the initial micro
needle was exchanged over a 0.018 micro wire for a transitional 4
French micro sheath. The micro sheath was then exchanged over a
0.035 wire for a 5 French vascular sheath.

A C2 cobra catheter was advanced over a Bentson wire and used to
select the celiac artery. A celiac arteriogram was performed. The
splenic artery is relatively hypertrophic relative to the hepatic
artery and quite tortuous. A reference image was obtained and
stored. It was initially difficult to select the common hepatic
artery as the larger splenic artery preferentially took both the
arterial flow and the wire. Ultimately, a glidewire was successfully
navigated into the common hepatic artery and the C2 cobra catheter
advanced into the artery as well. Additional arteriography was then
performed from the common hepatic artery. Robust gastroduodenal
artery. The right gastric artery appears to arise proximally from
the gastroduodenal artery. The hepatic arteries are relatively small
in caliber. There appears to be conventional hepatic arterial
anatomy.

A renegade STC microcatheter was then advanced into the right
hepatic artery. Arteriography was performed. There is a small
abnormal branch artery arising proximally and extending superior and
medially. Additionally, abnormal arterial branches are present
arising from what appears to be the segment 7 hepatic artery as well
as distally from branches of what appears to be an accessory segment
7 hepatic artery.

It appears that there [DATE] areas of contribution to the medial
aspect of hepatic segment 7 with abnormal tumor like arteries. The
most proximal unnamed branch arising from the right hepatic artery,
the dominant segment 7 hepatic artery in the accessory segment 7
hepatic artery. In an effort to determine the true arterial supply,
each vessel must be selected and interrogated.

The microcatheter was carefully advanced over a Fathom 16 wire into
the accessory segment 7 hepatic artery. Arteriography was performed
in order to identify the appropriate flow rate, volume and the
timing of parenchymal blush. A cone beam CT scan was then performed.
The 3D images were then independently reviewed by me on workstation.
This confirms that this represents the more superior and medial
aspect of the segment 7 arterial supply. At the inferior margin of
this vascular territory there is significant perfusion of the
superior and lateral margins of the tumor bed.

The microcatheter was brought back into the right hepatic artery.
Next, the catheter was carefully advanced into the main segment 7
hepatic artery. In similar fashion, digital subtraction angiography
was performed to establish the appropriate flow rate, volume and
timing of parenchymal phase imaging. Cone beam CT was then
performed. The 3D images were independently reviewed by me on the
workstation. This confirms that this artery supplies the more
inferior and medial aspect of hepatic segment 7. There is
significant perfusion of the tumor bed when correlated with prior
MRI.

Finally, with some difficulty the suspected direct tumoral branch
arising proximally from the right hepatic artery was successfully
catheterized. Contrast injection was performed. This represents a
very small artery with abnormal appearing vessels. Given the small
size of the vessel and the propensity for possible reflux if
radioembolization was performed at this site, the decision was made
to proceed with coil embolization in an effort to redirect arterial
flow from the main hepatic segment 7 artery into this vascular
distribution. The artery was therefore coil embolized with a single
2 x 4 mm soft penumbra Ruby low-profile detachable coil. Follow-up
angiography demonstrates success.

The microcatheter was brought back into the right hepatic artery.
Technetium labeled macro aggregated albumin was then injected
through the microcatheter. The microcatheter and 5 French catheter
were removed and appropriately disposed of. A limited right common
femoral arteriogram was performed confirming common femoral arterial
access. Hemostasis was then attained with the assistance of a Cordis
Exoseal extra arterial vascular plug.
IMPRESSION: 1. Confirmed dual blood supply to the tumor bed in the medial aspect
of hepatic segment 7. Arterial supply appears to originate from
inferior and superior segment 7 hepatic arteries.
2. Successful coil embolization of a small unnamed tumoral artery in
an effort to create vascular redistribution to the inferior segment
7 hepatic artery.

PLAN:
Proceed with segment 7 segmentectomy on the day of treatment. This
will require a split dose.

## 2020-08-11 IMAGING — NM NM LIVER IMG SPECT
4 series · 33 of 40 positions shown · non-contrast
Comparison: MRI [DATE].

CLINICAL DATA: Cholangiocarcinoma. Pre Y-90 radioembolization
imaging.

EXAM:
NUCLEAR MEDICINE LIVER SCAN; ULTRASOUND MISCELLANEOUS SOFT TISSUE
TECHNIQUE: Abdominal images were obtained in multiple projections after
intrahepatic arterial injection of radiopharmaceutical. SPECT
imaging was performed. Lung shunt calculation was performed.
RADIOPHARMACEUTICALS:  [GR] MAA TECHNETIUM TO 99M ALBUMIN
AGGREGATED

[Series 1: spect - (id) _(id)_tra · 4.1mm · 4.14mm/px · 5 of 128 frames shown]
[frame 11/128]
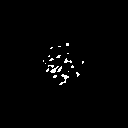
[frame 32/128]
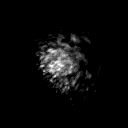
[frame 75/128]
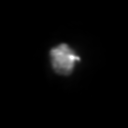
[frame 96/128]
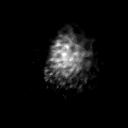
[frame 118/128]
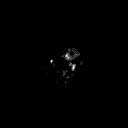

[Series 1: spect - (id) _(id)_cor · 4.1mm · 4.14mm/px · 5 of 128 frames shown]
[frame 11/128]
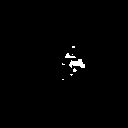
[frame 32/128]
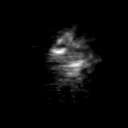
[frame 75/128]
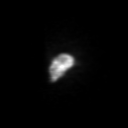
[frame 96/128]
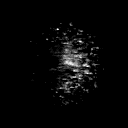
[frame 118/128]
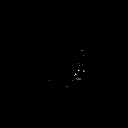

[Series 2: maa spect · 4.14mm/px · 5 of 64 frames shown]
[frame 6/64]
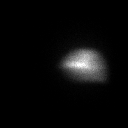
[frame 16/64]
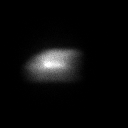
[frame 38/64]
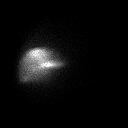
[frame 48/64]
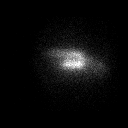
[frame 59/64]
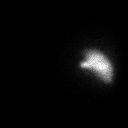

[Series 1025: mpr tra (id) range · 1.60mm/px · 18 of 88 slices shown]
[im 1/88]
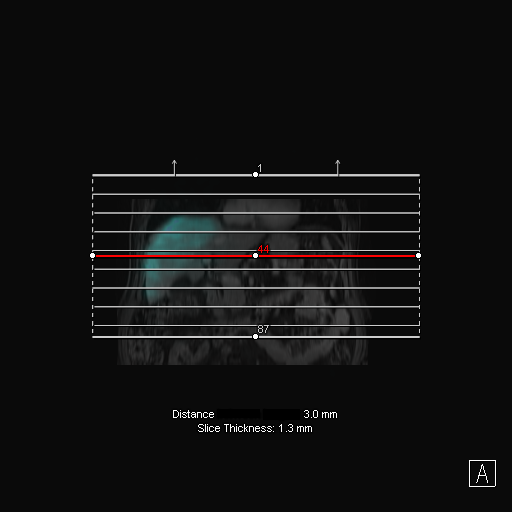
[im 9/88]
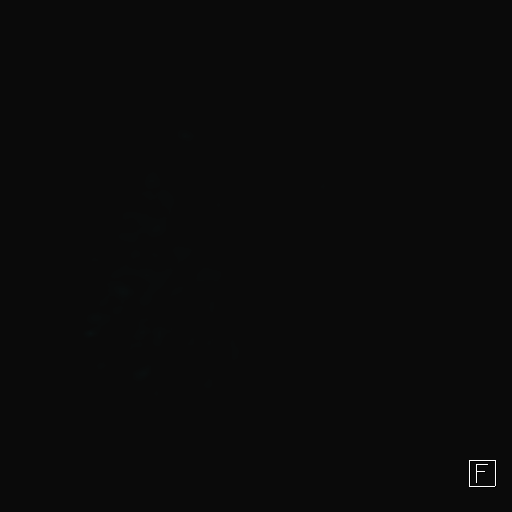
[im 13/88]
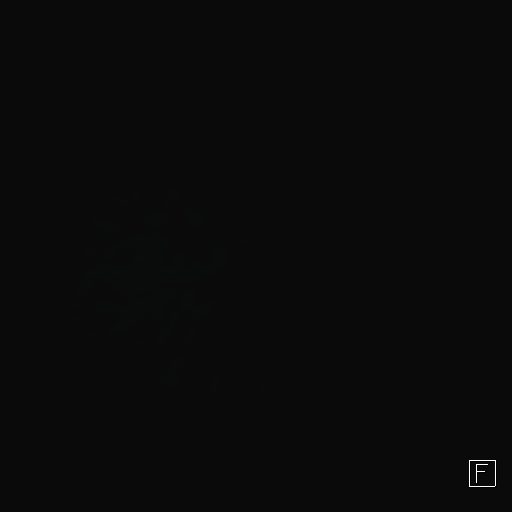
[im 17/88]
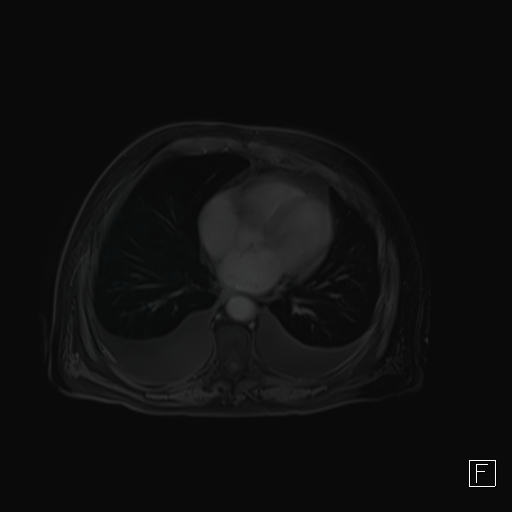
[im 21/88]
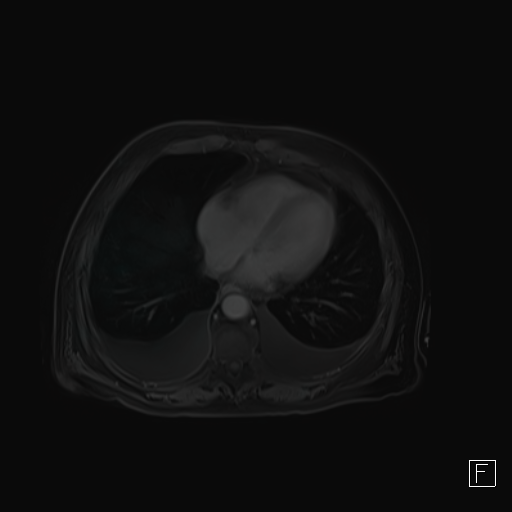
[im 25/88]
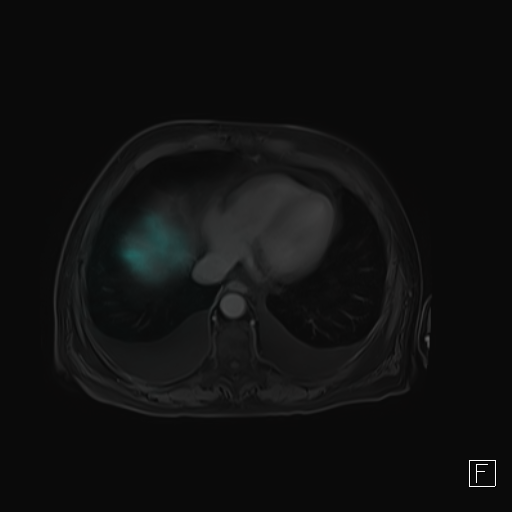
[im 34/88]
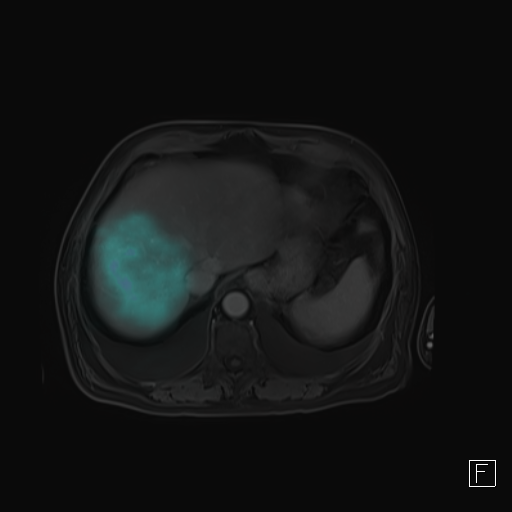
[im 38/88]
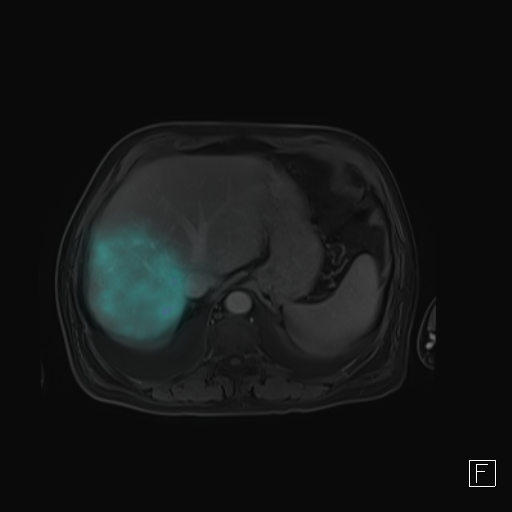
[im 42/88]
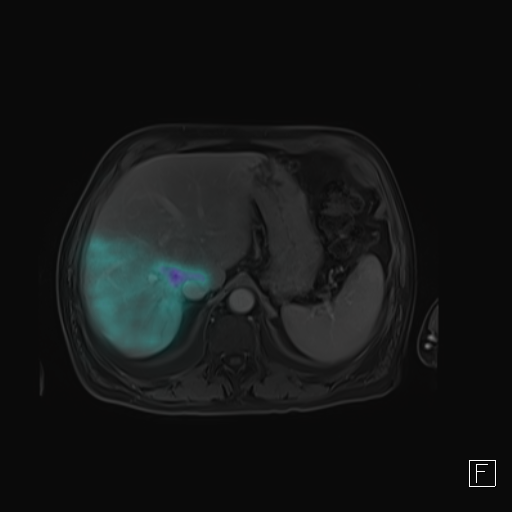
[im 46/88]
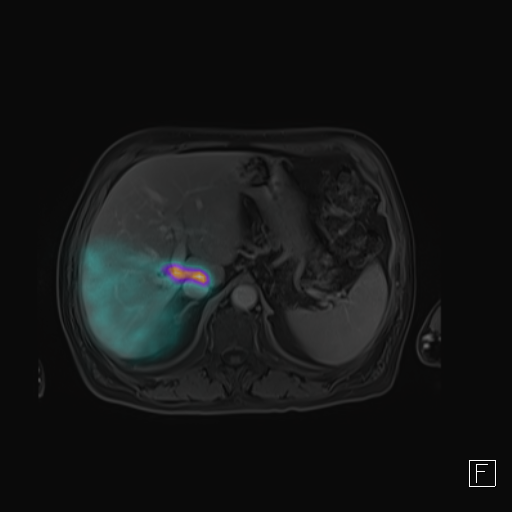
[im 50/88]
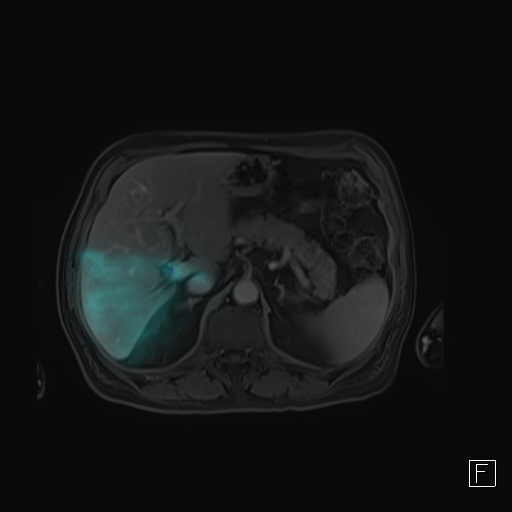
[im 59/88]
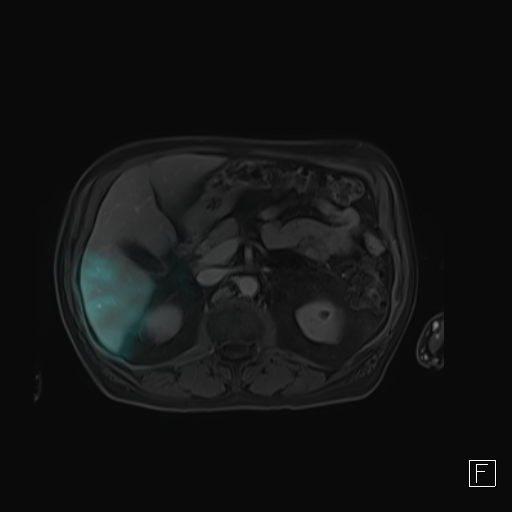
[im 63/88]
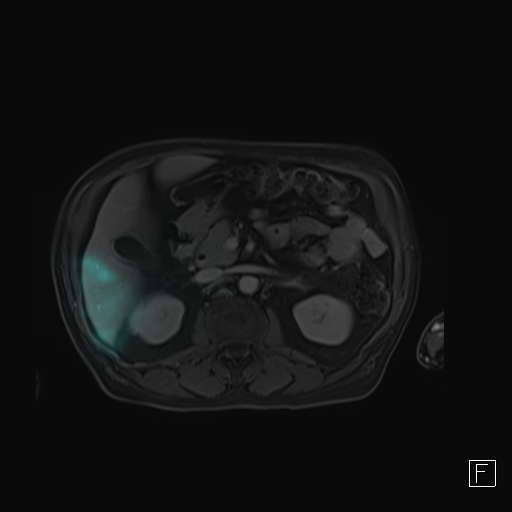
[im 67/88]
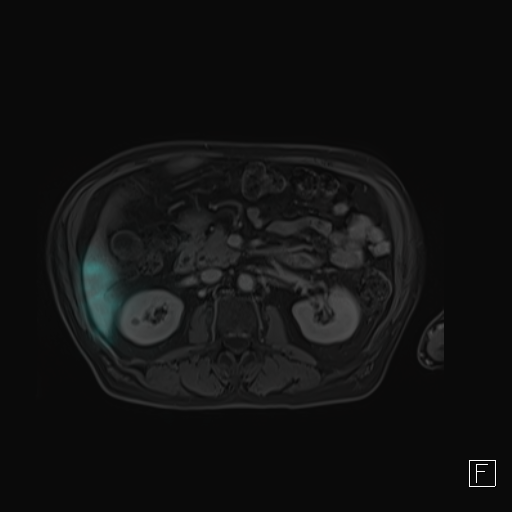
[im 71/88]
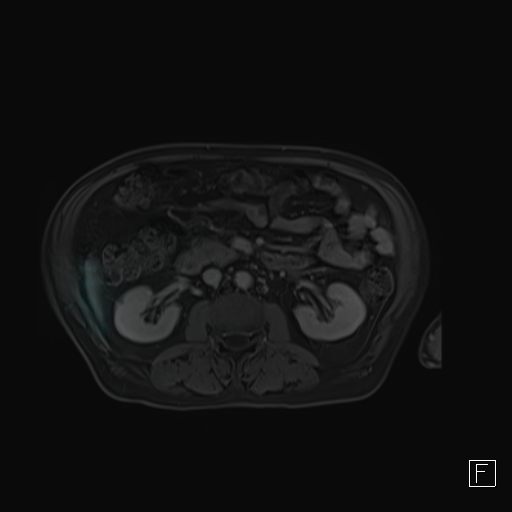
[im 75/88]
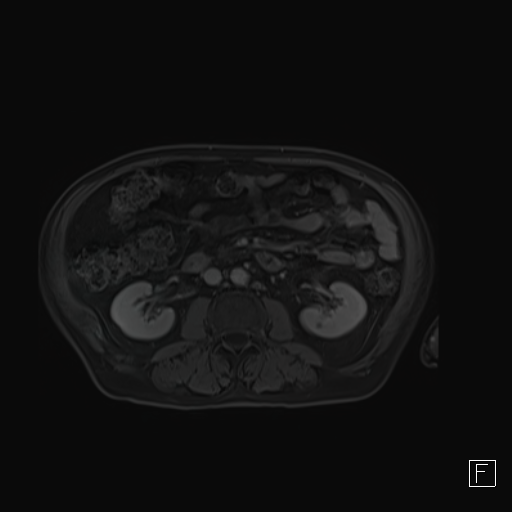
[im 83/88]
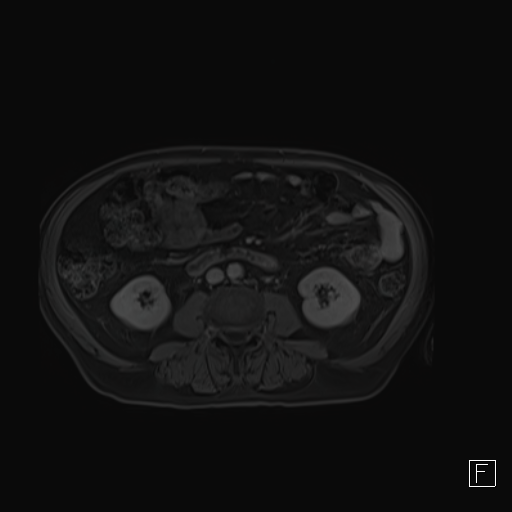
[im 88/88]
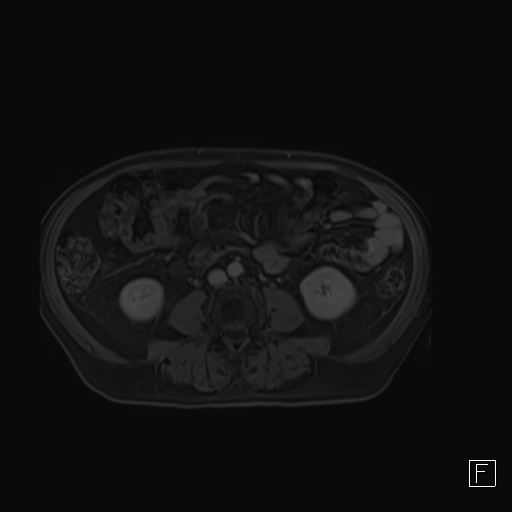

[33 of 40 positions shown; findings below may reference images not displayed]

FINDINGS: The injected microaggregated albumin localizes within the right
hepatic lobe with prominent uptake noted in the caudate lobe. No
evidence of activity within the stomach, duodenum, or bowel.

Calculated shunt fraction to the lungs equals 5.6%.
IMPRESSION: 1. No significant extrahepatic radiotracer activity following
intrahepatic arterial injection of MAA.
2. Lung shunt fraction equals 5.6%

## 2020-08-11 MED ORDER — MIDAZOLAM HCL 2 MG/2ML IJ SOLN
INTRAMUSCULAR | Status: AC | PRN
  Administered 2020-08-11: 1 mg via INTRAVENOUS
  Administered 2020-08-11: 0.5 mg via INTRAVENOUS
  Administered 2020-08-11: 1 mg via INTRAVENOUS
  Administered 2020-08-11 (×3): 0.5 mg via INTRAVENOUS

## 2020-08-11 MED ORDER — FENTANYL CITRATE (PF) 100 MCG/2ML IJ SOLN
INTRAMUSCULAR | Status: AC | PRN
  Administered 2020-08-11 (×2): 50 ug via INTRAVENOUS

## 2020-08-11 MED ORDER — MIDAZOLAM HCL 2 MG/2ML IJ SOLN
INTRAMUSCULAR | Status: AC
  Filled 2020-08-11: qty 4

## 2020-08-11 MED ORDER — HEPARIN SOD (PORK) LOCK FLUSH 100 UNIT/ML IV SOLN
500.0000 [IU] | Freq: Once | INTRAVENOUS | Status: AC
  Administered 2020-08-11: 500 [IU] via INTRAVENOUS
  Filled 2020-08-11: qty 5

## 2020-08-11 MED ORDER — IODIXANOL 320 MG/ML IV SOLN
50.0000 mL | Freq: Once | INTRAVENOUS | Status: AC | PRN
  Administered 2020-08-11: 8 mL via INTRA_ARTERIAL

## 2020-08-11 MED ORDER — FENTANYL CITRATE (PF) 100 MCG/2ML IJ SOLN
INTRAMUSCULAR | Status: AC
  Filled 2020-08-11: qty 4

## 2020-08-11 MED ORDER — LIDOCAINE HCL 1 % IJ SOLN
INTRAMUSCULAR | Status: AC
  Filled 2020-08-11: qty 20

## 2020-08-11 MED ORDER — IODIXANOL 320 MG/ML IV SOLN
50.0000 mL | Freq: Once | INTRAVENOUS | Status: AC | PRN
  Administered 2020-08-11: 25 mL via INTRA_ARTERIAL

## 2020-08-11 MED ORDER — LIDOCAINE HCL (PF) 1 % IJ SOLN
INTRAMUSCULAR | Status: AC | PRN
  Administered 2020-08-11: 10 mL via INTRADERMAL

## 2020-08-11 MED ORDER — IODIXANOL 320 MG/ML IV SOLN
50.0000 mL | Freq: Once | INTRAVENOUS | Status: AC | PRN
  Administered 2020-08-11: 30 mL via INTRA_ARTERIAL

## 2020-08-11 MED ORDER — TECHNETIUM TO 99M ALBUMIN AGGREGATED
4.1000 | Freq: Once | INTRAVENOUS | Status: AC
  Administered 2020-08-11: 4.1 via INTRAVENOUS

## 2020-08-11 MED ORDER — SODIUM CHLORIDE 0.9 % IV SOLN: INTRAVENOUS | Status: DC

## 2020-08-11 NOTE — Procedures (Signed)
Interventional Radiology Procedure Note  Procedure: Pre Y-90 planning study, coil embolization of accessory intra-hepatic artery  Complications: None  Estimated Blood Loss: None  Recommendations: - Bedrest x 4 hrs - DC home  Signed,  Criselda Peaches, MD

## 2020-08-11 NOTE — Discharge Instructions (Addendum)
DO NOT SHOWER X 24 HRS  For any questions or concerns call [phone]  Angiogram, Care After This sheet gives you information about how to care for yourself after your procedure. Your doctor may also give you more specific instructions. If you have problems or questions, contact your doctor. Follow these instructions at home: Insertion site care  Follow instructions from your doctor about how to take care of your long, thin tube (catheter) insertion area. Make sure you: ? Wash your hands with soap and water before you change your bandage (dressing). If you cannot use soap and water, use hand sanitizer. ? Change your bandage as told by your doctor. ? Leave stitches (sutures), skin glue, or skin tape (adhesive) strips in place. They may need to stay in place for 2 weeks or longer. If tape strips get loose and curl up, you may trim the loose edges. Do not remove tape strips completely unless your doctor says it is okay.  Do not take baths, swim, or use a hot tub until your doctor says it is okay.  You may shower 24-48 hours after the procedure or as told by your doctor. ? Gently wash the area with plain soap and water. ? Pat the area dry with a clean towel. ? Do not rub the area. This may cause bleeding.  Do not apply powder or lotion to the area. Keep the area clean and dry.  Check your insertion area every day for signs of infection. Check for: ? More redness, swelling, or pain. ? Fluid or blood. ? Warmth. ? Pus or a bad smell. Activity  Rest as told by your doctor, usually for 1-2 days.  Do not lift anything that is heavier than 10 lbs. (4.5 kg) or as told by your doctor.  Do not drive for 24 hours if you were given a medicine to help you relax (sedative).  Do not drive or use heavy machinery while taking prescription pain medicine. General instructions   Go back to your normal activities as told by your doctor, usually in about a week. Ask your doctor what activities are  safe for you.  If the insertion area starts to bleed, lie flat and put pressure on the area. If the bleeding does not stop, get help right away. This is an emergency.  Drink enough fluid to keep your pee (urine) clear or pale yellow.  Take over-the-counter and prescription medicines only as told by your doctor.  Keep all follow-up visits as told by your doctor. This is important. Contact a doctor if:  You have a fever.  You have chills.  You have more redness, swelling, or pain around your insertion area.  You have fluid or blood coming from your insertion area.  The insertion area feels warm to the touch.  You have pus or a bad smell coming from your insertion area.  You have more bruising around the insertion area.  Blood collects in the tissue around the insertion area (hematoma) that may be painful to the touch. Get help right away if:  You have a lot of pain in the insertion area.  The insertion area swells very fast.  The insertion area is bleeding, and the bleeding does not stop after holding steady pressure on the area.  The area near or just beyond the insertion area becomes pale, cool, tingly, or numb. These symptoms may be an emergency. Do not wait to see if the symptoms will go away. Get medical help right away. Call  your local emergency services (911 in the [place] Do not drive yourself to the hospital. Summary  After the procedure, it is common to have bruising and tenderness at the long, thin tube insertion area.  After the procedure, it is important to rest and drink plenty of fluids.  Do not take baths, swim, or use a hot tub until your doctor says it is okay to do so. You may shower 24-48 hours after the procedure or as told by your doctor.  If the insertion area starts to bleed, lie flat and put pressure on the area. If the bleeding does not stop, get help right away. This is an emergency. This information is not intended to replace advice given to you  by your health care provider. Make sure you discuss any questions you have with your health care provider. Document Revised: 10/19/2017 Document Reviewed: 10/31/2016 Elsevier Patient Education  [place]. Moderate Conscious Sedation, Adult, Care After These instructions provide you with information about caring for yourself after your procedure. Your health care provider may also give you more specific instructions. Your treatment has been planned according to current medical practices, but problems sometimes occur. Call your health care provider if you have any problems or questions after your procedure. What can I expect after the procedure? After your procedure, it is common:  To feel sleepy for several hours.  To feel clumsy and have poor balance for several hours.  To have poor judgment for several hours.  To vomit if you eat too soon. Follow these instructions at home: For at least 24 hours after the procedure:   Do not: ? Participate in activities where you could fall or become injured. ? Drive. ? Use heavy machinery. ? Drink alcohol. ? Take sleeping pills or medicines that cause drowsiness. ? Make important decisions or sign legal documents. ? Take care of children on your own.  Rest. Eating and drinking  Follow the diet recommended by your health care provider.  If you vomit: ? Drink water, juice, or soup when you can drink without vomiting. ? Make sure you have little or no nausea before eating solid foods. General instructions  Have a responsible adult stay with you until you are awake and alert.  Take over-the-counter and prescription medicines only as told by your health care provider.  If you smoke, do not smoke without supervision.  Keep all follow-up visits as told by your health care provider. This is important. Contact a health care provider if:  You keep feeling nauseous or you keep vomiting.  You feel light-headed.  You develop a  rash.  You have a fever. Get help right away if:  You have trouble breathing. This information is not intended to replace advice given to you by your health care provider. Make sure you discuss any questions you have with your health care provider. Document Revised: 10/19/2017 Document Reviewed: 02/26/2016 Elsevier Patient Education  2020 [employer].

## 2020-08-11 NOTE — Sedation Documentation (Signed)
Patient transported to MI department accompanied by this RN. Lesia Hausen, RN

## 2020-08-11 NOTE — Sedation Documentation (Signed)
Condom catheter placed as per discussion with Dr Laurence Ferrari.

## 2020-08-12 ENCOUNTER — Ambulatory Visit: Payer: Medicare HMO | Admitting: Physician Assistant

## 2020-08-12 LAB — AFP TUMOR MARKER: AFP, Serum, Tumor Marker: 11 ng/mL — ABNORMAL HIGH (ref 0.0–8.3)

## 2020-08-12 LAB — CEA: CEA: 4.4 ng/mL (ref 0.0–4.7)

## 2020-08-14 ENCOUNTER — Other Ambulatory Visit: Payer: Self-pay | Admitting: Medical

## 2020-08-14 DIAGNOSIS — R0600 Dyspnea, unspecified: Secondary | ICD-10-CM

## 2020-08-16 ENCOUNTER — Ambulatory Visit: Payer: Medicare HMO | Admitting: Hematology

## 2020-08-16 ENCOUNTER — Other Ambulatory Visit: Payer: Medicare HMO

## 2020-08-19 DIAGNOSIS — R972 Elevated prostate specific antigen [PSA]: Secondary | ICD-10-CM | POA: Diagnosis not present

## 2020-08-19 DIAGNOSIS — N401 Enlarged prostate with lower urinary tract symptoms: Secondary | ICD-10-CM | POA: Diagnosis not present

## 2020-08-19 NOTE — Telephone Encounter (Signed)
Lucianne Lei, Here is a refill for you:) Threasa Beards

## 2020-08-23 ENCOUNTER — Other Ambulatory Visit: Payer: Self-pay | Admitting: Hematology

## 2020-08-24 ENCOUNTER — Encounter: Payer: Self-pay | Admitting: Cardiovascular Disease

## 2020-08-24 ENCOUNTER — Ambulatory Visit (INDEPENDENT_AMBULATORY_CARE_PROVIDER_SITE_OTHER): Payer: Medicare HMO | Admitting: Cardiovascular Disease

## 2020-08-24 ENCOUNTER — Other Ambulatory Visit: Payer: Self-pay

## 2020-08-24 ENCOUNTER — Other Ambulatory Visit: Payer: Self-pay | Admitting: Cardiovascular Disease

## 2020-08-24 DIAGNOSIS — R0600 Dyspnea, unspecified: Secondary | ICD-10-CM | POA: Diagnosis not present

## 2020-08-24 DIAGNOSIS — R600 Localized edema: Secondary | ICD-10-CM | POA: Insufficient documentation

## 2020-08-24 DIAGNOSIS — I10 Essential (primary) hypertension: Secondary | ICD-10-CM | POA: Diagnosis not present

## 2020-08-24 DIAGNOSIS — I4811 Longstanding persistent atrial fibrillation: Secondary | ICD-10-CM | POA: Diagnosis not present

## 2020-08-24 DIAGNOSIS — I2510 Atherosclerotic heart disease of native coronary artery without angina pectoris: Secondary | ICD-10-CM

## 2020-08-24 DIAGNOSIS — E785 Hyperlipidemia, unspecified: Secondary | ICD-10-CM | POA: Diagnosis not present

## 2020-08-24 MED ORDER — FUROSEMIDE 40 MG PO TABS: 80.0000 mg | ORAL_TABLET | Freq: Two times a day (BID) | ORAL | 3 refills | Status: DC

## 2020-08-24 MED ORDER — HYDRALAZINE HCL 50 MG PO TABS: 50.0000 mg | ORAL_TABLET | Freq: Three times a day (TID) | ORAL | 3 refills | Status: DC

## 2020-08-24 MED ORDER — DILTIAZEM HCL ER COATED BEADS 240 MG PO CP24: 240.0000 mg | ORAL_CAPSULE | Freq: Every day | ORAL | 3 refills | Status: DC

## 2020-08-24 NOTE — Progress Notes (Signed)
08/24/2020 [name]   July 15, 1946  [id number]  Primary Physician [name], PA Primary Cardiologist: Lorretta Harp MD [name], [place]  HPI:  Brandon Sherman is a 74 y.o.  moderately overweight married Caucasian male father of 10, grandfather of 5 grandchildren who is retired from working at [employer] for 52 years . His wife Brandon Sherman is also a patient of mine. He was referred by Ihor Dow, PA for atrial fibrillation.  I last saw him in the office 05/11/2020.His risk factors include treated hypertension, diabetes and hyperlipidemia. Both his mother and sister had known CAD. He is never had a heart attack or stroke. He unfortunately was diagnosed with liver cancer back in September of last year and is begun on chemotherapy since that time. A Port-A-Cath was placed 2 months ago which time he was noted to be in A. fib and he was started on Eliquis 3 weeks ago by his PCP. He has complained of increased dyspnea on exertion over the last 6 months but denies chest pain. He does not have history of heavy alcohol abuse which was discontinued at the time of his liver cancer diagnosis.  After being on appropriate doses of Eliquis for at least 4 weeks he underwent successful outpatient cardioversion by Dr. Meda Coffee 04/27/2020.  He apparently went back in A. fib a week later.  Does complain of some dyspnea on exertion.  He has normal LV function by 2D echo and a negative Myoview stress test.  He does have hypertension with blood pressures in the 170 range.  Since I saw him 3 months ago he continues to do well.  He is scheduled for liver implants because of liver cancer.  He does get somewhat fatigued.  His wife checks his blood pressure and pulse at home and has kept a log.  His blood pressures have been on the high side.  He does have lower extremity edema.   Current Meds  Medication Sig  . allopurinol (ZYLOPRIM) 100 MG tablet Take 100 mg by mouth daily.  Marland Kitchen apixaban (ELIQUIS) 5  MG TABS tablet Take 1 tablet (5 mg total) by mouth 2 (two) times daily.  . colchicine 0.6 MG tablet Take 1 tablet (0.6 mg total) by mouth daily as needed (gout flare). Take daily for 3 days, then as needed for gout flare  . diltiazem (CARDIZEM CD) 240 MG 24 hr capsule Take 1 capsule (240 mg total) by mouth daily.  . fenofibrate (TRICOR) 145 MG tablet Take 145 mg by mouth daily.  . finasteride (PROSCAR) 5 MG tablet Take 5 mg by mouth daily.  . furosemide (LASIX) 40 MG tablet Take 2 tablets (80 mg total) by mouth 2 (two) times daily.  . hydrALAZINE (APRESOLINE) 50 MG tablet Take 1 tablet (50 mg total) by mouth 3 (three) times daily.  Marland Kitchen KLOR-CON M20 20 MEQ tablet TAKE 1 TABLET BY MOUTH TWICE A DAY  . lidocaine-prilocaine (EMLA) cream Apply 1 application topically as needed.  . magnesium oxide (MAG-OX) 400 MG tablet Take 1 tablet (400 mg total) by mouth daily.  . metFORMIN (GLUCOPHAGE-XR) 500 MG 24 hr tablet 1 day or 1 dose.  . metoprolol succinate (TOPROL XL) 25 MG 24 hr tablet Take 1 tablet (25 mg total) by mouth daily.  . ondansetron (ZOFRAN) 8 MG tablet Take 1 tablet (8 mg total) by mouth 2 (two) times daily as needed. Start on the third day after chemotherapy.  . pantoprazole (PROTONIX) 40 MG  tablet Take 1 tablet (40 mg total) by mouth 2 (two) times daily.  . pravastatin (PRAVACHOL) 40 MG tablet Take 40 mg by mouth daily.  . sucralfate (CARAFATE) 1 g tablet Take 1 tablet (1 g total) by mouth every 6 (six) hours as needed. Please schedule a yearly follow up for further refills: [phone]  . tamsulosin (FLOMAX) 0.4 MG CAPS capsule Take 0.4 mg by mouth daily.  . Vitamin D, Ergocalciferol, (DRISDOL) 1.25 MG (50000 UT) CAPS capsule Take 50,000 Units by mouth every 7 (seven) days.  . [DISCONTINUED] diltiazem (CARDIZEM CD) 180 MG 24 hr capsule Take 1 capsule (180 mg total) by mouth daily.  . [DISCONTINUED] furosemide (LASIX) 40 MG tablet TAKE 1 TABLET BY MOUTH THREE TIMES A DAY  . [DISCONTINUED]  hydrALAZINE (APRESOLINE) 50 MG tablet Take 1 tablet (50 mg total) by mouth in the morning and at bedtime.     No Known Allergies  Social History   Socioeconomic History  . Marital status: Married    Spouse name: Not on file  . Number of children: 3  . Years of education: Not on file  . Highest education level: Not on file  Occupational History  . Not on file  Tobacco Use  . Smoking status: Never Smoker  . Smokeless tobacco: Current User    Types: Chew  . Tobacco comment: for 60 years, 1-2 daily   Vaping Use  . Vaping Use: Never used  Substance and Sexual Activity  . Alcohol use: Not Currently    Comment: occasionally  . Drug use: Never  . Sexual activity: Not on file  Other Topics Concern  . Not on file  Social History Narrative  . Not on file   Social Determinants of Health   Financial Resource Strain:   . Difficulty of Paying Living Expenses: Not on file  Food Insecurity:   . Worried About Charity fundraiser in the Last Year: Not on file  . Ran Out of Food in the Last Year: Not on file  Transportation Needs: No Transportation Needs  . Lack of Transportation (Medical): No  . Lack of Transportation (Non-Medical): No  Physical Activity: Insufficiently Active  . Days of Exercise per Week: 1 day  . Minutes of Exercise per Session: 50 min  Stress:   . Feeling of Stress : Not on file  Social Connections: Unknown  . Frequency of Communication with Friends and Family: More than three times a week  . Frequency of Social Gatherings with Friends and Family: More than three times a week  . Attends Religious Services: Not on file  . Active Member of Clubs or Organizations: Not on file  . Attends Archivist Meetings: Not on file  . Marital Status: Married  Human resources officer Violence:   . Fear of Current or Ex-Partner: Not on file  . Emotionally Abused: Not on file  . Physically Abused: Not on file  . Sexually Abused: Not on file     Review of  Systems: General: negative for chills, fever, night sweats or weight changes.  Cardiovascular: negative for chest pain, dyspnea on exertion, edema, orthopnea, palpitations, paroxysmal nocturnal dyspnea or shortness of breath Dermatological: negative for rash Respiratory: negative for cough or wheezing Urologic: negative for hematuria Abdominal: negative for nausea, vomiting, diarrhea, bright red blood per rectum, melena, or hematemesis Neurologic: negative for visual changes, syncope, or dizziness All other systems reviewed and are otherwise negative except as noted above.    Blood pressure 136/80, pulse  78, height 6' (1.829 m), weight 215 lb (97.5 kg).  General appearance: alert and no distress Neck: no adenopathy, no carotid bruit, no JVD, supple, symmetrical, trachea midline and thyroid not enlarged, symmetric, no tenderness/mass/nodules Lungs: clear to auscultation bilaterally Heart: regular rate and rhythm, S1, S2 normal, no murmur, click, rub or gallop Extremities: extremities normal, atraumatic, no cyanosis or edema Pulses: 2+ and symmetric Skin: Skin color, texture, turgor normal. No rashes or lesions Neurologic: Alert and oriented X 3, normal strength and tone. Normal symmetric reflexes. Normal coordination and gait  EKG not performed today  ASSESSMENT AND PLAN:   Dyslipidemia, goal LDL below 70 History of dyslipidemia on fenofibrate and pravastatin with lipid profile performed 03/24/2020 revealing total cholesterol of 125, LDL 67 and HDL 40.  Essential hypertension History of essential hypertension with blood pressure measured today 136/80.  He is on hydralazine, diltiazem and metoprolol.  He has moderate renal insufficiency with serum creatinines that run in the mid to high 1 range making use of an ACE or ARB not feasible.  I am going to increase his hydralazine to 3 times daily as well as a Cardizem CD from 180 mg to 240 mg.  I am going to have him keep a blood pressure log  for 4 weeks and see a Pharm.D. back in a month to review make appropriate changes.  Atrial fibrillation (HCC) History of chronic A. fib status post failed DC cardioversion by Dr. Meda Coffee 04/27/2020 on Eliquis oral anticoagulation.  Coronary artery calcification seen on CT scan History of coronary calcification seen on chest CT with a negative Myoview stress test performed 05/05/2020.  Bilateral lower extremity edema Bilateral lower extremity edema probably related to diastolic dysfunction with 2D echo performed 05/20/2020 revealing normal LV systolic function within did terminate diastolic parameters.  There were no significant valvular abnormalities.  He is on furosemide 40 mg p.o. twice daily.  He has 2-3+ pitting edema.  I am going to increase this to 80 mg p.o. twice daily and we will check a basic metabolic panel in 7 to 10 days.      Lorretta Harp MD FACP,FACC,FAHA, [facility] 08/24/2020 12:18 PM

## 2020-08-24 NOTE — Assessment & Plan Note (Signed)
Bilateral lower extremity edema probably related to diastolic dysfunction with 2D echo performed 05/20/2020 revealing normal LV systolic function within did terminate diastolic parameters.  There were no significant valvular abnormalities.  He is on furosemide 40 mg p.o. twice daily.  He has 2-3+ pitting edema.  I am going to increase this to 80 mg p.o. twice daily and we will check a basic metabolic panel in 7 to 10 days.

## 2020-08-24 NOTE — Assessment & Plan Note (Signed)
History of coronary calcification seen on chest CT with a negative Myoview stress test performed 05/05/2020.

## 2020-08-24 NOTE — Assessment & Plan Note (Signed)
History of chronic A. fib status post failed DC cardioversion by Dr. Meda Coffee 04/27/2020 on Eliquis oral anticoagulation.

## 2020-08-24 NOTE — Assessment & Plan Note (Signed)
History of dyslipidemia on fenofibrate and pravastatin with lipid profile performed 03/24/2020 revealing total cholesterol of 125, LDL 67 and HDL 40.

## 2020-08-24 NOTE — Assessment & Plan Note (Signed)
History of essential hypertension with blood pressure measured today 136/80.  He is on hydralazine, diltiazem and metoprolol.  He has moderate renal insufficiency with serum creatinines that run in the mid to high 1 range making use of an ACE or ARB not feasible.  I am going to increase his hydralazine to 3 times daily as well as a Cardizem CD from 180 mg to 240 mg.  I am going to have him keep a blood pressure log for 4 weeks and see a Pharm.D. back in a month to review make appropriate changes.

## 2020-08-24 NOTE — Patient Instructions (Signed)
Medication Instructions:   INCREASE HYDRALAZINE TO 50 MG ONE TABLET THREE TIMES DAILY  INCREASE DILTIAZEM TO 240 MG ONCE DAILY  INCREASE FUROSEMIDE TO 80 MG TWICE DAILY= 2 OF THE 40 MG TABLETS TWICE DAILY  *If you need a refill on your cardiac medications before your next appointment, please call your pharmacy*   Lab Work:  Your physician recommends that you return for lab work in: 7-10 DAYS  If you have labs (blood work) drawn today and your tests are completely normal, you will receive your results only by: Marland Kitchen MyChart Message (if you have MyChart) OR . A paper copy in the mail If you have any lab test that is abnormal or we need to change your treatment, we will call you to review the results.  Follow-Up: At [facility], you and your health needs are our priority.  As part of our continuing mission to provide you with exceptional heart care, we have created designated Provider Care Teams.  These Care Teams include your primary Cardiologist (physician) and Advanced Practice Providers (APPs -  Physician Assistants and Nurse Practitioners) who all work together to provide you with the care you need, when you need it.  We recommend signing up for the patient portal called "MyChart".  Sign up information is provided on this After Visit Summary.  MyChart is used to connect with patients for Virtual Visits (Telemedicine).  Patients are able to view lab/test results, encounter notes, upcoming appointments, etc.  Non-urgent messages can be sent to your provider as well.   To learn more about what you can do with MyChart, go to [email or web address].    Your next appointment:    Your physician recommends that you schedule a follow-up appointment in: [place] MD [place]  Your physician recommends that you schedule a follow-up appointment in:  [place] physician wants you to follow-up in: [place] will receive a reminder letter in  the mail two months in advance. If you don't receive a letter, please call our office to schedule the follow-up appointment.   KEEP A BLOOD PRESSURE LOG AND BRING TO FOLLOW UP APPOINTMENT AND ALSO BRING BLOOD PRESSURE CUFF

## 2020-08-25 ENCOUNTER — Other Ambulatory Visit: Payer: Self-pay | Admitting: Student

## 2020-08-25 ENCOUNTER — Other Ambulatory Visit: Payer: Self-pay | Admitting: Radiology

## 2020-08-26 ENCOUNTER — Ambulatory Visit (HOSPITAL_COMMUNITY)
Admission: RE | Admit: 2020-08-26 | Discharge: 2020-08-26 | Disposition: A | Discharge status: Home / Self Care | Payer: Medicare HMO | Source: Ambulatory Visit | Attending: Interventional Radiology | Admitting: Interventional Radiology

## 2020-08-26 ENCOUNTER — Encounter (HOSPITAL_COMMUNITY)
Admission: RE | Admit: 2020-08-26 | Discharge: 2020-08-26 | Disposition: A | Discharge status: Home / Self Care | Payer: Medicare HMO | Source: Ambulatory Visit | Attending: Interventional Radiology | Admitting: Interventional Radiology

## 2020-08-26 ENCOUNTER — Encounter (HOSPITAL_COMMUNITY): Payer: Self-pay

## 2020-08-26 ENCOUNTER — Other Ambulatory Visit: Payer: Self-pay

## 2020-08-26 ENCOUNTER — Other Ambulatory Visit (HOSPITAL_COMMUNITY): Payer: Self-pay | Admitting: Interventional Radiology

## 2020-08-26 DIAGNOSIS — Z7984 Long term (current) use of oral hypoglycemic drugs: Secondary | ICD-10-CM | POA: Diagnosis not present

## 2020-08-26 DIAGNOSIS — K219 Gastro-esophageal reflux disease without esophagitis: Secondary | ICD-10-CM | POA: Insufficient documentation

## 2020-08-26 DIAGNOSIS — Z7901 Long term (current) use of anticoagulants: Secondary | ICD-10-CM | POA: Insufficient documentation

## 2020-08-26 DIAGNOSIS — I4891 Unspecified atrial fibrillation: Secondary | ICD-10-CM | POA: Insufficient documentation

## 2020-08-26 DIAGNOSIS — C221 Intrahepatic bile duct carcinoma: Secondary | ICD-10-CM

## 2020-08-26 DIAGNOSIS — E785 Hyperlipidemia, unspecified: Secondary | ICD-10-CM | POA: Diagnosis not present

## 2020-08-26 DIAGNOSIS — Z79899 Other long term (current) drug therapy: Secondary | ICD-10-CM | POA: Diagnosis not present

## 2020-08-26 DIAGNOSIS — E119 Type 2 diabetes mellitus without complications: Secondary | ICD-10-CM | POA: Insufficient documentation

## 2020-08-26 DIAGNOSIS — I10 Essential (primary) hypertension: Secondary | ICD-10-CM | POA: Insufficient documentation

## 2020-08-26 HISTORY — PX: IR ANGIOGRAM SELECTIVE EACH ADDITIONAL VESSEL: IMG667

## 2020-08-26 HISTORY — PX: IR ANGIOGRAM VISCERAL SELECTIVE: IMG657

## 2020-08-26 HISTORY — PX: IR US GUIDE VASC ACCESS LEFT: IMG2389

## 2020-08-26 HISTORY — PX: IR EMBO TUMOR ORGAN ISCHEMIA INFARCT INC GUIDE ROADMAPPING: IMG5449

## 2020-08-26 LAB — CBC WITH DIFFERENTIAL/PLATELET
Abs Immature Granulocytes: 0.02 10*3/uL (ref 0.00–0.07)
Basophils Absolute: 0.1 10*3/uL (ref 0.0–0.1)
Basophils Relative: 1 %
Eosinophils Absolute: 0.1 10*3/uL (ref 0.0–0.5)
Eosinophils Relative: 2 %
HCT: 31.6 % — ABNORMAL LOW (ref 39.0–52.0)
Hemoglobin: 10.5 g/dL — ABNORMAL LOW (ref 13.0–17.0)
Immature Granulocytes: 0 %
Lymphocytes Relative: 16 %
Lymphs Abs: 1 10*3/uL (ref 0.7–4.0)
MCH: 36 pg — ABNORMAL HIGH (ref 26.0–34.0)
MCHC: 33.2 g/dL (ref 30.0–36.0)
MCV: 108.2 fL — ABNORMAL HIGH (ref 80.0–100.0)
Monocytes Absolute: 0.7 10*3/uL (ref 0.1–1.0)
Monocytes Relative: 10 %
Neutro Abs: 4.8 10*3/uL (ref 1.7–7.7)
Neutrophils Relative %: 71 %
Platelets: 211 10*3/uL (ref 150–400)
RBC: 2.92 MIL/uL — ABNORMAL LOW (ref 4.22–5.81)
RDW: 15.2 % (ref 11.5–15.5)
WBC: 6.7 10*3/uL (ref 4.0–10.5)
nRBC: 0 % (ref 0.0–0.2)

## 2020-08-26 LAB — COMPREHENSIVE METABOLIC PANEL
ALT: 11 U/L (ref 0–44)
AST: 19 U/L (ref 15–41)
Albumin: 3.6 g/dL (ref 3.5–5.0)
Alkaline Phosphatase: 32 U/L — ABNORMAL LOW (ref 38–126)
Anion gap: 14 (ref 5–15)
BUN: 31 mg/dL — ABNORMAL HIGH (ref 8–23)
CO2: 23 mmol/L (ref 22–32)
Calcium: 9.5 mg/dL (ref 8.9–10.3)
Chloride: 104 mmol/L (ref 98–111)
Creatinine, Ser: 1.94 mg/dL — ABNORMAL HIGH (ref 0.61–1.24)
GFR calc non Af Amer: 34 mL/min — ABNORMAL LOW (ref >60)
Glucose, Bld: 105 mg/dL — ABNORMAL HIGH (ref 70–99)
Potassium: 3.6 mmol/L (ref 3.5–5.1)
Sodium: 141 mmol/L (ref 135–145)
Total Bilirubin: 0.9 mg/dL (ref 0.3–1.2)
Total Protein: 6.8 g/dL (ref 6.5–8.1)

## 2020-08-26 LAB — GLUCOSE, CAPILLARY: Glucose-Capillary: 92 mg/dL (ref 70–99)

## 2020-08-26 LAB — PROTIME-INR
INR: 1.2 (ref 0.8–1.2)
Prothrombin Time: 14.3 seconds (ref 11.4–15.2)

## 2020-08-26 IMAGING — XA IR EMBO TUMOR ORGAN ISCHEMIA INFARCT INC GUIDE ROADMAPPING
11 of 17 series · 12 of 24 positions shown · IV contrast (IODINE)
Comparison: none

INDICATION: 74-year-old male with intrahepatic cholangiocarcinoma. He presents
today for trans arterial radio embolization with [AGE].

[Series 1: care body 4 · 1 of 16 frames shown (1 of 10)]
[frame 12/16]
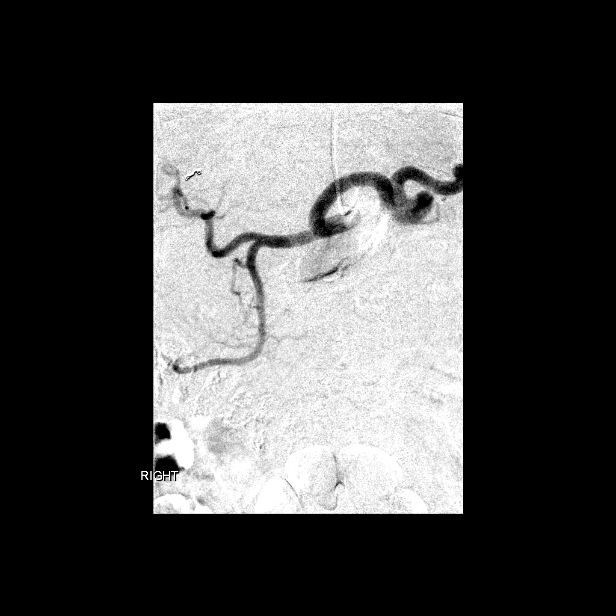

[Series 3: care body 4 · 1 of 18 frames shown (2 of 10)]
[frame 1/18]
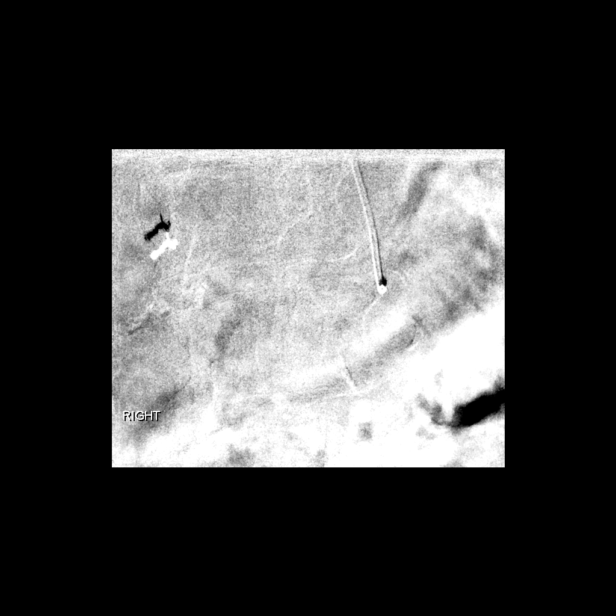

[Series 4: care body 4 · 1 of 25 frames shown (3 of 10)]
[frame 20/25]
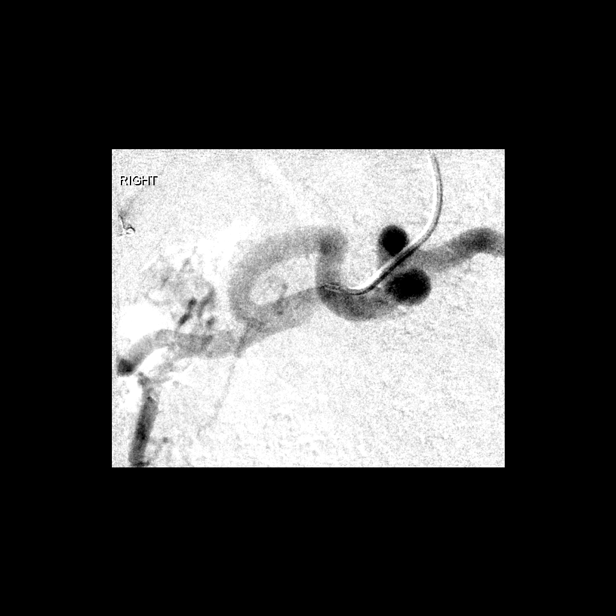

[Series 5: care body 4 · 1 of 29 frames shown (4 of 10)]
[frame 25/29]
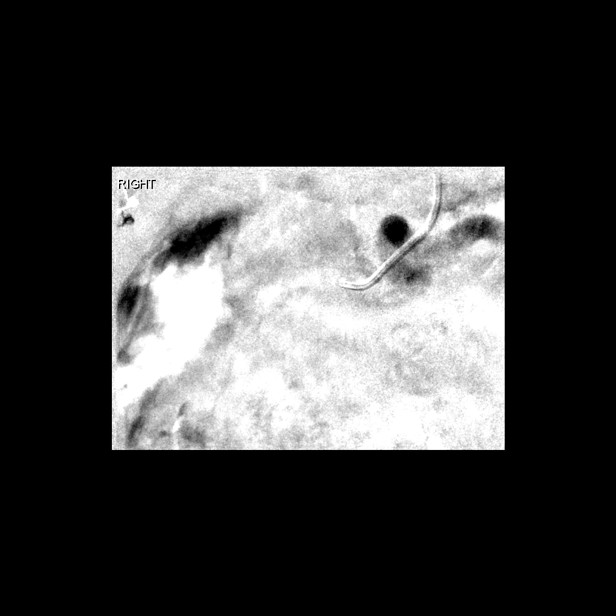

[Series 7: care body 4 · 1 of 37 frames shown (5 of 10)]
[frame 19/37]
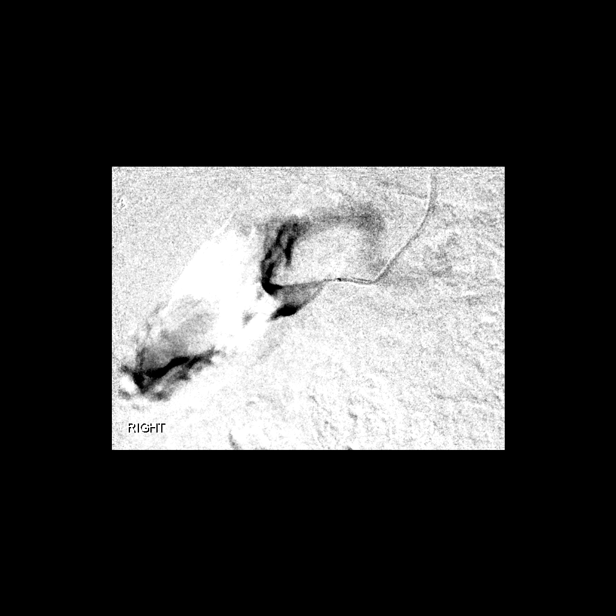

[Series 8: care body 4 · 1 of 29 frames shown (6 of 10)]
[frame 25/29]
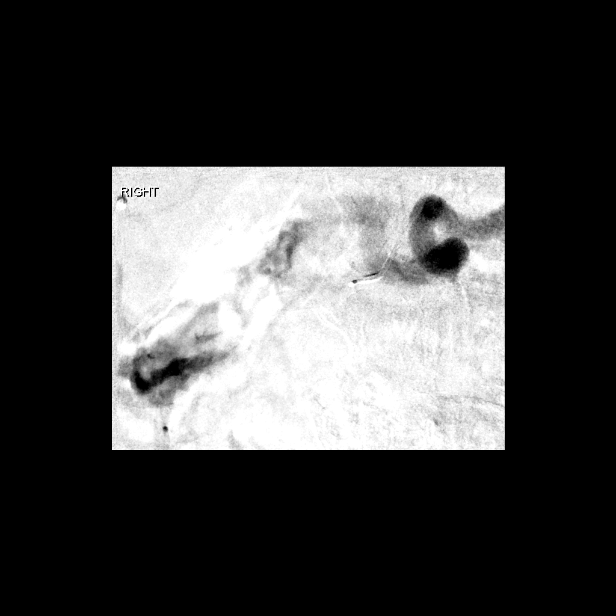

[Series 10: care body 4 · 1 of 51 frames shown (7 of 10)]
[frame 26/51]
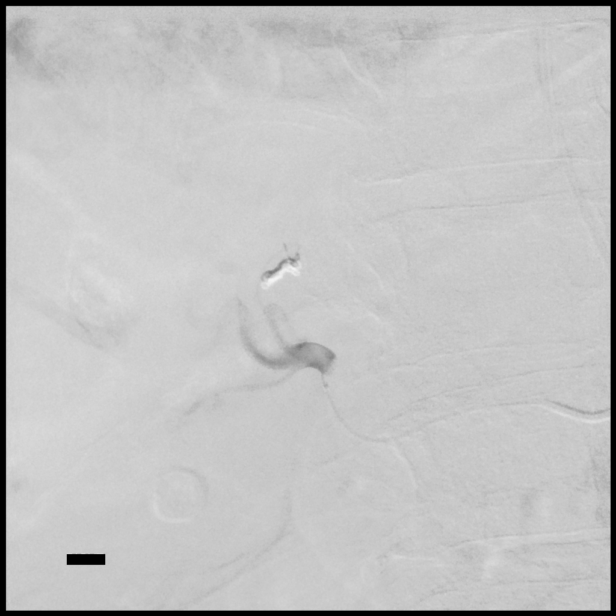

[Series 12: care body 4 · 1 of 47 frames shown (8 of 10)]
[frame 8/47]
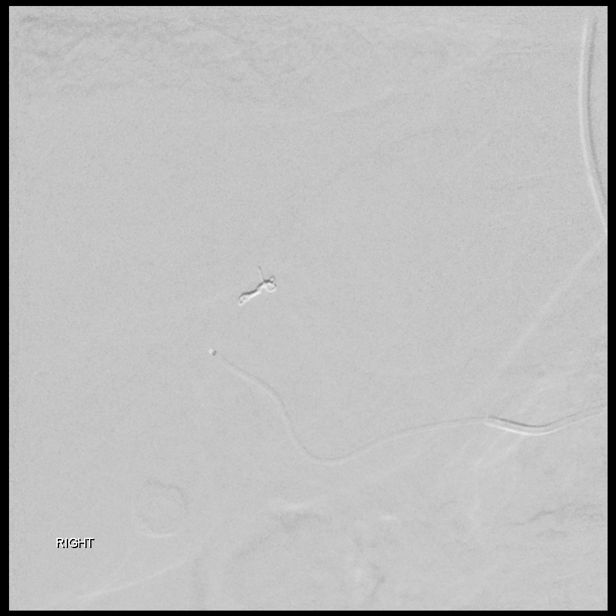

[Series 13: care body 4 · 1 of 40 frames shown (9 of 10)]
[frame 24/40]
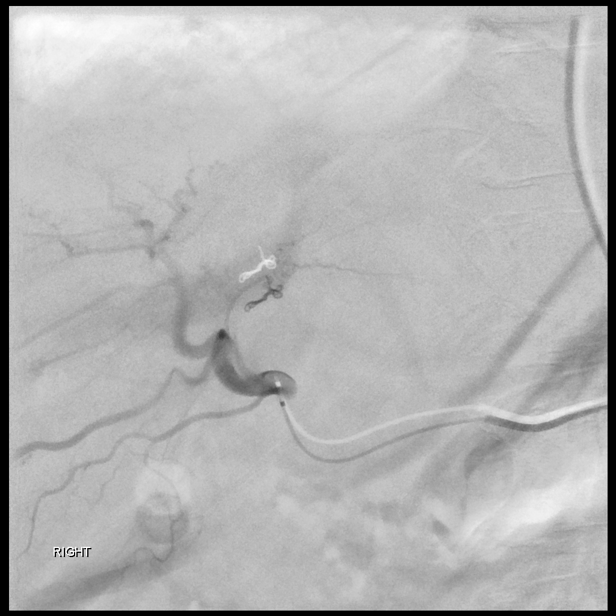

[Series 14: care body 4 · 1 of 30 frames shown (10 of 10)]
[frame 26/30]
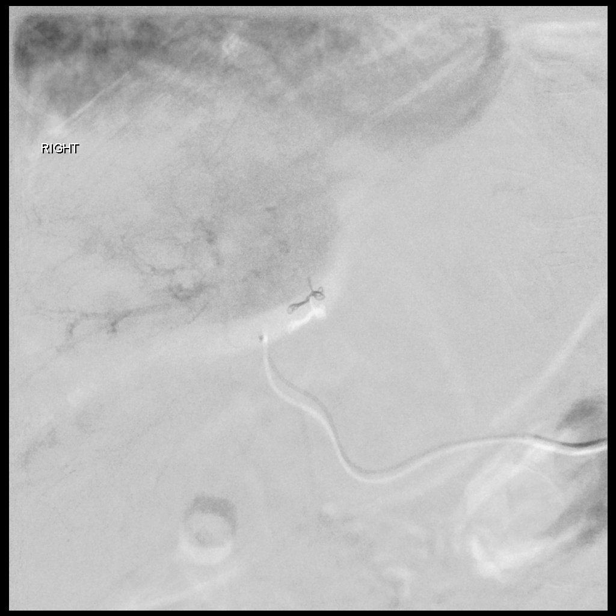

[Series 300: ld dsa body · 2 of 15 slices shown]
[im 1/15]
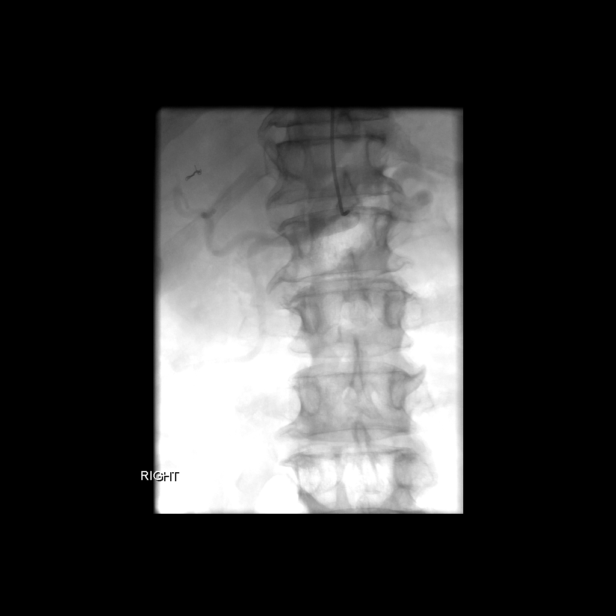
[im 15/15]
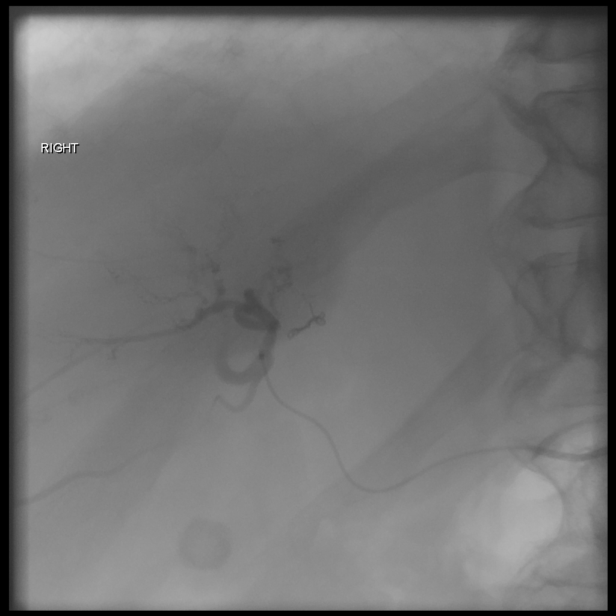

[12 of 24 positions shown; findings below may reference images not displayed]

EXAM:
ADDITIONAL ARTERIOGRAPHY; SELECTIVE VISCERAL ARTERIOGRAPHY; IR EMBO
TUMOR ORGAN ISCHEMIA INFARCT INC GUIDE ROADMAPPING; IR ULTRASOUND
GUIDANCE VASC ACCESS LEFT

MEDICATIONS:
3.375 g Zosyn. The antibiotic was administered within 1 hour of the
procedure

ANESTHESIA/SEDATION:
Moderate (conscious) sedation was employed during this procedure. A
total of Versed 2 mg and Fentanyl 100 mcg was administered
intravenously.

Moderate Sedation Time: 72 minutes. The patient's level of
consciousness and vital signs were monitored continuously by
radiology nursing throughout the procedure under my direct
supervision.

CONTRAST:  80 mL Visipaque 320

FLUOROSCOPY TIME:  Fluoroscopy Time: 13 minutes 30 seconds ([KW]
mGy).

COMPLICATIONS:
None immediate.



The left radial artery was interrogated with ultrasound and found to
be widely patent and of adequate diameter. An image was obtained and
stored for the medical record. Local anesthesia was attained by
infiltration with 1% lidocaine. Under real-time sonographic
guidance, the vessel was punctured with a 21 gauge micropuncture
needle. Using standard technique, the initial micro needle was
exchanged over a 0.021 micro wire for a hydrophilic 5 French slim
arterial sheath. The sheath was flushed and then a radial cocktail
consisting of [KW] units heparin, 2.5 mg Verapamil and 200 mcg
nitroglycerine was injected through the sheath.

An ultimate 1 microcatheter was subsequently advanced over a Bentson
wire, up the arm, down the thoracic aorta and into the abdominal
aorta. The catheter was used to select the celiac artery. A celiac
arteriogram was performed. The tortuous origin of the common hepatic
artery was successfully identified. Attempts were made to
catheterize the common hepatic artery using a 0.035 glidewire to
advance the 5 French ultimate 1 catheter into the common hepatic
artery. This was ultimately unsuccessful. Therefore, a renegade KPPS
KPPS microcatheter and Fathom 16 wire were introduced coaxially
through the 5 French base catheter. Utilizing the microwire, the
common hepatic artery was successfully catheterized. The 5 French
base catheter was then successfully advanced over the wire and
microcatheter combo into the common hepatic artery. At this point,
the microcatheter was advanced into the proper hepatic artery.
Hepatic arteriography was then performed. It was essential to do
this to ensure that the previous embolization was durable, that no
new tumor feeding vessels are present, and also that no new
collateral pathways have opened since the last planning procedure.

Proper hepatic arterial anatomy remains stable. The left hepatic
artery is easily visualized. No new intrahepatic collaterals from
this branch.

The microcatheter was subsequently advanced into the right hepatic
artery. Additional arteriography was performed. Angiography was
performed with the microcatheter in different positions until I was
satisfied that we were obtaining excellent parenchymal opacification
in the region of the tumor bed while sparing is much liver as
possible. Importantly, a caudate lobe branch more proximally was
noted in the microcatheter advanced beyond this point. At this time,
the microcatheter was secured in place.

Trans arterial radioembolization was subsequently performed in the
standard fashion. Resin microspheres labeled with [AGE] were injected
in aliquots under real-time fluoroscopic guidance. Antegrade flow
was checked intermittently and confirmed throughout the procedure.
Antegrade flow was present when the full dose had been injected.
After radioembolization was complete, the microcatheter was brought
back into the 5 French catheter, and the catheter system was removed
as a unit and disposed of appropriately.

A second round of 200 mcg nitroglycerin and 2.5 mg verapamil was
then injected through the radial sheath in the radial sheath was
removed. Hemostasis was attained using a TR band. The patient
tolerated the procedure well.
IMPRESSION: Successful right hemi lobar trans arterial radio embolization with [AGE].

## 2020-08-26 IMAGING — NM NM RADIO PHARM THERAPY INTRA ARTERIAL
5 series · 20 of 20 positions shown · non-contrast
Comparison: MAA scan [DATE].  Abdominal MRI [DATE]

CLINICAL DATA: 74-year-old male with nonresectable
cholangiocarcinoma of the RIGHT hepatic lobe. Patient status post
systemic chemotherapy. Patient presents for radio embolization of
tumor within the RIGHT hepatic lobe.

EXAM:
NUCLEAR MEDICINE SPECIAL MED RAD PHYSICS CONS; NUCLEAR MEDICINE
RADIO PHARM THERAPY INTRA ARTERIAL; NUCLEAR MEDICINE TREATMENT
PROCEDURE; NUCLEAR MEDICINE LIVER SCAN
TECHNIQUE: In conjunction with the interventional radiologist a Y- Microsphere
dose was calculated utilizing body surface area formulation.
Calculated dose equal 33.3 mCi. Pre therapy MAA liver SPECT scan and
CTA were evaluated. Utilizing a microcatheter system, the hepatic
artery was selected and Y-90 microspheres were delivered in
fractionated aliquots. Radiopharmaceutical was delivered by the
interventional radiologist and nuclear radiologist.
The patient tolerated procedure well. No adverse effects were noted.
Bremsstrahlung planar and SPECT imaging of the abdomen following
intrahepatic arterial delivery of Y-90 microsphere was performed.
RADIOPHARMACEUTICALS:  31.8 millicuries yttrium 90 microspheres

[Series 1: y-90 statics · 2.07mm/px · 1 of 1 slices shown (1 of 2)]
[im 1/1]
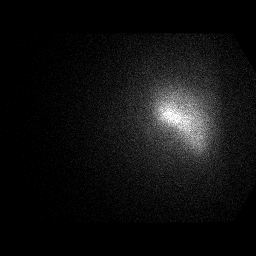

[Series 1: spect - (id) _(id)_tra · 4.1mm · 4.14mm/px · 6 of 128 frames shown]
[frame 11/128]
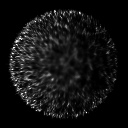
[frame 32/128]
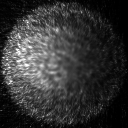
[frame 54/128]
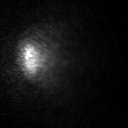
[frame 75/128]
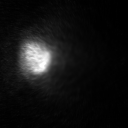
[frame 96/128]
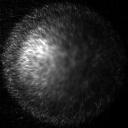
[frame 118/128]
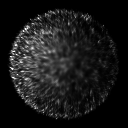

[Series 1: y-90 statics · 2.07mm/px · 1 of 1 slices shown (2 of 2)]
[im 1/1  full-range]
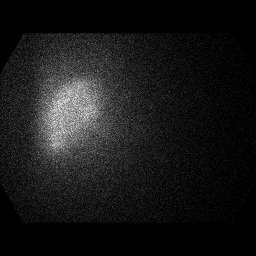

[Series 1: spect - (id) _(id)_cor · 4.1mm · 4.14mm/px · 6 of 128 frames shown]
[frame 11/128]
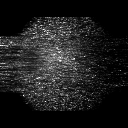
[frame 32/128]
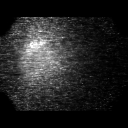
[frame 54/128]
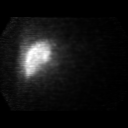
[frame 75/128]
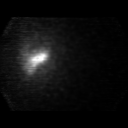
[frame 96/128]
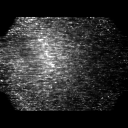
[frame 118/128]
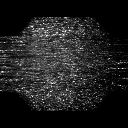

[Series 2: y-90 spect · 4.14mm/px · 6 of 64 frames shown]
[frame 6/64]
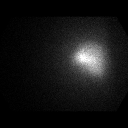
[frame 16/64]
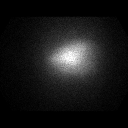
[frame 27/64]
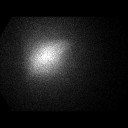
[frame 38/64]
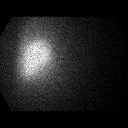
[frame 48/64]
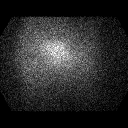
[frame 59/64]
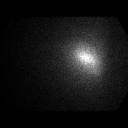

[20 of 20 positions shown; findings below may reference images not displayed]

FINDINGS: [AGE] microspheres therapy as above. First therapy the right
hepatic lobe.

Bremsstrahlung planar and SPECT imaging of the abdomen following
intrahepatic arterial delivery of [UO] demonstrates
radioactivity localized to the RIGHT hepatic lobe. No evidence of
extrahepatic activity.
IMPRESSION: Successful [AGE] microsphere delivery for treatment of unresectable
liver cholangiocarcinoma. First therapy to the RIGHT lobe.

Bremssstrahlung scan demonstrates activity localized to RIGHT
hepatic lobe with no extrahepatic activity identified.

## 2020-08-26 IMAGING — NM NM LIVER IMG SPECT
5 series · 20 of 20 positions shown · non-contrast
Comparison: MAA scan [DATE].  Abdominal MRI [DATE]

CLINICAL DATA: 74-year-old male with nonresectable
cholangiocarcinoma of the RIGHT hepatic lobe. Patient status post
systemic chemotherapy. Patient presents for radio embolization of
tumor within the RIGHT hepatic lobe.

EXAM:
NUCLEAR MEDICINE SPECIAL MED RAD PHYSICS CONS; NUCLEAR MEDICINE
RADIO PHARM THERAPY INTRA ARTERIAL; NUCLEAR MEDICINE TREATMENT
PROCEDURE; NUCLEAR MEDICINE LIVER SCAN
TECHNIQUE: In conjunction with the interventional radiologist a Y- Microsphere
dose was calculated utilizing body surface area formulation.
Calculated dose equal 33.3 mCi. Pre therapy MAA liver SPECT scan and
CTA were evaluated. Utilizing a microcatheter system, the hepatic
artery was selected and Y-90 microspheres were delivered in
fractionated aliquots. Radiopharmaceutical was delivered by the
interventional radiologist and nuclear radiologist.
The patient tolerated procedure well. No adverse effects were noted.
Bremsstrahlung planar and SPECT imaging of the abdomen following
intrahepatic arterial delivery of Y-90 microsphere was performed.
RADIOPHARMACEUTICALS:  31.8 millicuries yttrium 90 microspheres

[Series 1: y-90 statics · 2.07mm/px · 1 of 1 slices shown (1 of 2)]
[im 1/1  full-range]
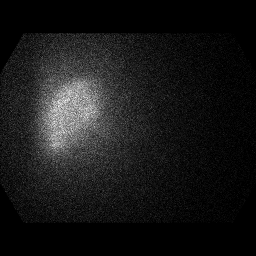

[Series 1: spect - (id) _(id)_tra · 4.1mm · 4.14mm/px · 6 of 128 frames shown]
[frame 11/128]
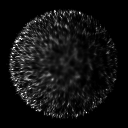
[frame 32/128]
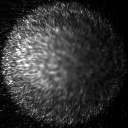
[frame 54/128]
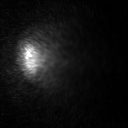
[frame 75/128]
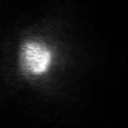
[frame 96/128]
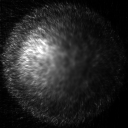
[frame 118/128]
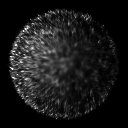

[Series 1: spect - (id) _(id)_cor · 4.1mm · 4.14mm/px · 6 of 128 frames shown]
[frame 11/128]
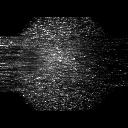
[frame 32/128]
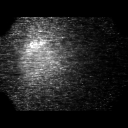
[frame 54/128]
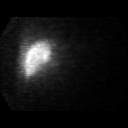
[frame 75/128]
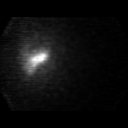
[frame 96/128]
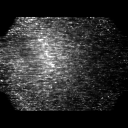
[frame 118/128]
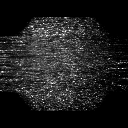

[Series 1: y-90 statics · 2.07mm/px · 1 of 1 slices shown (2 of 2)]
[im 1/1]
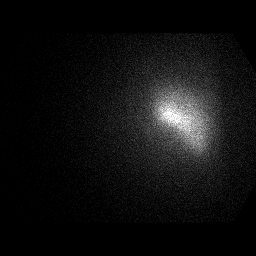

[Series 2: y-90 spect · 4.14mm/px · 6 of 64 frames shown]
[frame 6/64]
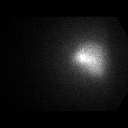
[frame 16/64]
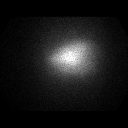
[frame 27/64]
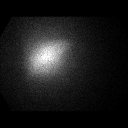
[frame 38/64]
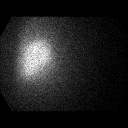
[frame 48/64]
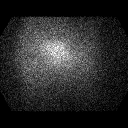
[frame 59/64]
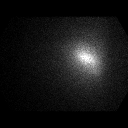

[20 of 20 positions shown; findings below may reference images not displayed]

FINDINGS: [AGE] microspheres therapy as above. First therapy the right
hepatic lobe.

Bremsstrahlung planar and SPECT imaging of the abdomen following
intrahepatic arterial delivery of [UO] demonstrates
radioactivity localized to the RIGHT hepatic lobe. No evidence of
extrahepatic activity.
IMPRESSION: Successful [AGE] microsphere delivery for treatment of unresectable
liver cholangiocarcinoma. First therapy to the RIGHT lobe.

Bremssstrahlung scan demonstrates activity localized to RIGHT
hepatic lobe with no extrahepatic activity identified.

## 2020-08-26 MED ORDER — HEPARIN SODIUM (PORCINE) 1000 UNIT/ML IJ SOLN
INTRAMUSCULAR | Status: AC
  Filled 2020-08-26: qty 1

## 2020-08-26 MED ORDER — LIDOCAINE HCL (PF) 1 % IJ SOLN
INTRAMUSCULAR | Status: AC | PRN
  Administered 2020-08-26: 5 mL

## 2020-08-26 MED ORDER — FENTANYL CITRATE (PF) 100 MCG/2ML IJ SOLN
INTRAMUSCULAR | Status: AC | PRN
  Administered 2020-08-26 (×2): 50 ug via INTRAVENOUS

## 2020-08-26 MED ORDER — FENTANYL CITRATE (PF) 100 MCG/2ML IJ SOLN
INTRAMUSCULAR | Status: AC
  Filled 2020-08-26: qty 4

## 2020-08-26 MED ORDER — IODIXANOL 320 MG/ML IV SOLN
50.0000 mL | Freq: Once | INTRAVENOUS | Status: AC | PRN
  Administered 2020-08-26: 50 mL via INTRA_ARTERIAL

## 2020-08-26 MED ORDER — LIDOCAINE HCL 1 % IJ SOLN
INTRAMUSCULAR | Status: AC
  Filled 2020-08-26: qty 20

## 2020-08-26 MED ORDER — VERAPAMIL HCL 2.5 MG/ML IV SOLN: INTRA_ARTERIAL | Status: AC | PRN

## 2020-08-26 MED ORDER — ONDANSETRON HCL 4 MG/2ML IJ SOLN
4.0000 mg | Freq: Once | INTRAMUSCULAR | Status: AC
  Administered 2020-08-26: 4 mg via INTRAVENOUS
  Filled 2020-08-26: qty 2

## 2020-08-26 MED ORDER — IODIXANOL 320 MG/ML IV SOLN
50.0000 mL | Freq: Once | INTRAVENOUS | Status: AC | PRN
  Administered 2020-08-26: 15 mL via INTRA_ARTERIAL

## 2020-08-26 MED ORDER — PIPERACILLIN-TAZOBACTAM 3.375 G IVPB: 3.3750 g | Freq: Once | INTRAVENOUS | Status: AC

## 2020-08-26 MED ORDER — PANTOPRAZOLE SODIUM 40 MG IV SOLR
40.0000 mg | Freq: Once | INTRAVENOUS | Status: AC
  Administered 2020-08-26: 40 mg via INTRAVENOUS
  Filled 2020-08-26: qty 40

## 2020-08-26 MED ORDER — VERAPAMIL HCL 2.5 MG/ML IV SOLN
INTRAVENOUS | Status: AC
  Filled 2020-08-26: qty 2

## 2020-08-26 MED ORDER — MIDAZOLAM HCL 2 MG/2ML IJ SOLN
INTRAMUSCULAR | Status: AC | PRN
  Administered 2020-08-26 (×2): 1 mg via INTRAVENOUS

## 2020-08-26 MED ORDER — YTTRIUM 90 INJECTION: 31.8000 | INJECTION | Freq: Once | INTRAVENOUS | Status: DC | PRN

## 2020-08-26 MED ORDER — NITROGLYCERIN IN D5W 100-5 MCG/ML-% IV SOLN
INTRAVENOUS | Status: AC
  Filled 2020-08-26: qty 250

## 2020-08-26 MED ORDER — DEXAMETHASONE SODIUM PHOSPHATE 10 MG/ML IJ SOLN
8.0000 mg | Freq: Once | INTRAMUSCULAR | Status: AC
  Administered 2020-08-26: 8 mg via INTRAVENOUS
  Filled 2020-08-26: qty 1

## 2020-08-26 MED ORDER — SODIUM CHLORIDE 0.9 % IV SOLN: INTRAVENOUS | Status: DC

## 2020-08-26 MED ORDER — HEPARIN SOD (PORK) LOCK FLUSH 100 UNIT/ML IV SOLN
500.0000 [IU] | INTRAVENOUS | Status: DC | PRN
  Filled 2020-08-26: qty 5

## 2020-08-26 MED ORDER — MIDAZOLAM HCL 2 MG/2ML IJ SOLN
INTRAMUSCULAR | Status: AC
  Filled 2020-08-26: qty 6

## 2020-08-26 MED ORDER — PIPERACILLIN-TAZOBACTAM 3.375 G IVPB
INTRAVENOUS | Status: AC
  Administered 2020-08-26: 3.375 g via INTRAVENOUS
  Filled 2020-08-26: qty 50

## 2020-08-26 NOTE — Discharge Instructions (Signed)
Please call Interventional Radiology clinic [phone] with any questions or concerns.  You may remove your dressing and shower tomorrow.   Hepatic Artery Radioembolization, Care After This sheet gives you information about how to care for yourself after your procedure. Your health care provider may also give you more specific instructions. If you have problems or questions, contact your health care provider. What can I expect after the procedure? After the procedure, it is common to have:  A slight fever for 1-2 weeks. If your fever gets worse, tell your health care provider.  Fatigue.  Loss of appetite. This should gradually improve after about 1 week.  Abdominal pain on your right side.  Soreness and tenderness in your groin area where the needle and catheter were placed (puncture site). Follow these instructions at home:  Puncture site care  Follow instructions from your health care provider about how to take care of the puncture site. Make sure you: ? Wash your hands with soap and water before you change your bandage (dressing). If soap and water are not available, use hand sanitizer. ? Change your dressing as told by your health care provider. ? Leave stitches (sutures), skin glue, or adhesive strips in place. These skin closures may need to stay in place for 2 weeks or longer. If adhesive strip edges start to loosen and curl up, you may trim the loose edges. Do not remove adhesive strips completely unless your health care provider tells you to do that.  Check your puncture site every day for signs of infection. Check for: ? More redness, swelling, or pain. ? More fluid or blood. ? Warmth. ? Pus or a bad smell. Activity  Rest and return to your normal activities as told by your health care provider. Ask your health care provider what activities are safe for you.  Do not drive for 24 hours after the procedure if you were given a medicine to help you relax (sedative).  Do  not lift anything that is heavier than 10 lb (4.5 kg) until your health care provider says that it is safe. Medicines  Take over-the-counter and prescription medicines only as told by your health care provider.  Do not drive or use heavy machinery while taking prescription pain medicine. Radiation precautions  For up to a week after your procedure, there will be a small amount of radioactivity near your liver. This is not especially dangerous to other people. However, you should follow these precautions for 7 days: ? Do not come in close contact with people. ? Do not sleep in the same bed as someone else. ? Do not hold children or babies. ? Do not have contact with pregnant women. General instructions  To prevent or treat constipation while you are taking prescription pain medicine, your health care provider may recommend that you: ? Drink enough fluid to keep your urine clear or pale yellow. ? Take over-the-counter or prescription medicines. ? Eat foods that are high in fiber, such as fresh fruits and vegetables, whole grains, and beans. ? Limit foods that are high in fat and processed sugars, such as fried and sweet foods.  Eat frequent small meals until your appetite returns. Follow instructions from your health care provider about eating or drinking restrictions.  Do not take baths, swim, or use a hot tub until your health care provider approves. You may take showers. Wash your puncture site with mild soap and water and pat the area dry.  Wear compression stockings as told by your   health care provider. These stockings help to prevent blood clots and reduce swelling in your legs.  Keep all follow-up visits as told by your health care provider. This is important. You may need to have blood tests and imaging tests done. Contact a health care provider if:  You have more redness, swelling, or pain around your puncture site.  You have more fluid or blood coming from your puncture  site.  Your puncture site feels warm to the touch.  You have pus or a bad smell coming from your puncture site.  You have pain that: ? Gets worse. ? Does not get better with medicine. ? Feels like very bad heartburn. ? Is in the middle of your abdomen, above your belly button.  Your skin and the white parts of your eyes turn yellow (jaundice).  The color of your urine changes to dark brown.  The color of your stool changes to light yellow.  Your abdominal measurement (girth) increases in a short period of time.  You gain more than 5 lb (2.3 kg) in a short period of time. Get help right away if:  You have a fever that lasts longer than 2 weeks or is higher than what your health care provider told you to expect.  You develop any of the following in your legs: ? Pain. ? Swelling. ? Skin that is cold or pale or turns blue.  You have chest pain.  You have blood in your vomit, saliva, or stool.  You have trouble breathing. This information is not intended to replace advice given to you by your health care provider. Make sure you discuss any questions you have with your health care provider. Document Revised: 10/19/2017 Document Reviewed: 08/05/2016 Elsevier Patient Education  2020 [employer]  Post Y-90 Radioembolization Discharge Instructions  You have been given a radioactive material during your procedure.  While it is safe for you to be discharged home from the hospital, you need to proceed directly home.    Do not use public transportation, including air travel, lasting more than 2 hours for 1 week.  Avoid crowded public places for 1 week.  Adult visitors should try to avoid close contact with you for 1 week.    Children and pregnant females should not visit or have close contact with you for 1 week.  Items that you touch are not radioactive.  Do not sleep in the same bed as your partner for 1 week, and a condom should be used for sexual activity during the  first 24 hours.  Your blood may be radioactive and caution should be used if any bleeding occurs during the recovery period.  Body fluids may be radioactive for 24 hours.  Wash your hands after voiding.  Men should sit to urinate.  Dispose of any soiled materials (flush down toilet or place in trash at home) during the first day.  Drink 6 to 8 glasses of fluids per day for 5 days to hydrate yourself.  If you need to see a doctor during the first week, you must let them know that you were treated with yttrium-90 microspheres, and will be slightly radioactive.  They can call Interventional Radiology [phone] with any questions.   Moderate Conscious Sedation, Adult, Care After These instructions provide you with information about caring for yourself after your procedure. Your health care provider may also give you more specific instructions. Your treatment has been planned according to current medical practices, but problems sometimes occur. Call your health   care provider if you have any problems or questions after your procedure. What can I expect after the procedure? After your procedure, it is common:  To feel sleepy for several hours.  To feel clumsy and have poor balance for several hours.  To have poor judgment for several hours.  To vomit if you eat too soon. Follow these instructions at home: For at least 24 hours after the procedure:   Do not: ? Participate in activities where you could fall or become injured. ? Drive. ? Use heavy machinery. ? Drink alcohol. ? Take sleeping pills or medicines that cause drowsiness. ? Make important decisions or sign legal documents. ? Take care of children on your own.  Rest. Eating and drinking  Follow the diet recommended by your health care provider.  If you vomit: ? Drink water, juice, or soup when you can drink without vomiting. ? Make sure you have little or no nausea before eating solid foods. General instructions  Have a  responsible adult stay with you until you are awake and alert.  Take over-the-counter and prescription medicines only as told by your health care provider.  If you smoke, do not smoke without supervision.  Keep all follow-up visits as told by your health care provider. This is important. Contact a health care provider if:  You keep feeling nauseous or you keep vomiting.  You feel light-headed.  You develop a rash.  You have a fever. Get help right away if:  You have trouble breathing. This information is not intended to replace advice given to you by your health care provider. Make sure you discuss any questions you have with your health care provider. Document Revised: 10/19/2017 Document Reviewed: 02/26/2016 Elsevier Patient Education  2020 [employer]  

## 2020-08-26 NOTE — H&P (Signed)
Referring Physician(s): Feng,Y  Supervising Physician: Jacqulynn Cadet  Patient Status:  [facility]  Chief Complaint:  Intrahepatic cholangiocarcinoma  Subjective: Patient familiar to IR service from liver lesion biopsy on 08/11/2019 and Port-A-Cath placement on 02/23/2020.  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  He presents again today for hepatic Y 90 radio embolization/segment 7 segmentectomy.  Additional medical history as listed below. He currently denies headache, chest pain, dyspnea, cough, abdominal/back pain, nausea, vomiting or bleeding.  Past Medical History:  Diagnosis Date  . Arthritis   . Diabetes ([place])   . GERD (gastroesophageal reflux disease)   . Hyperlipidemia   . Hypertension   . Intrahepatic cholangiocarcinoma ([place])   a fib Past Surgical History:  Procedure Laterality Date  . APPENDECTOMY  1980  . CARDIOVERSION N/A 04/27/2020   Procedure: CARDIOVERSION;  Surgeon: Dorothy Spark, MD;  Location: [facility] ENDOSCOPY;  Service: Cardiovascular;  Laterality: N/A;  . CARDIOVERSION N/A 05/19/2020   Procedure: CARDIOVERSION;  Surgeon: Elouise Munroe, MD;  Location[facility] ENDOSCOPY;  Service: Cardiovascular;  Laterality: N/A;  . COLONOSCOPY    . IR 3D INDEPENDENT WKST  08/11/2020  . IR 3D INDEPENDENT WKST  08/11/2020  . IR ANGIOGRAM SELECTIVE EACH ADDITIONAL VESSEL  08/11/2020  . IR ANGIOGRAM SELECTIVE EACH ADDITIONAL VESSEL  08/11/2020  . IR ANGIOGRAM SELECTIVE EACH ADDITIONAL VESSEL  08/11/2020  . IR ANGIOGRAM SELECTIVE EACH ADDITIONAL VESSEL  08/11/2020  . IR ANGIOGRAM SELECTIVE EACH ADDITIONAL VESSEL  08/11/2020  . IR ANGIOGRAM SELECTIVE EACH ADDITIONAL VESSEL  08/11/2020  . IR ANGIOGRAM VISCERAL SELECTIVE  08/11/2020  . IR EMBO ARTERIAL NOT HEMORR HEMANG INC GUIDE ROADMAPPING  08/11/2020  . IR IMAGING GUIDED PORT INSERTION  02/23/2020  . IR  RADIOLOGIST EVAL & MGMT  07/14/2020  . IR US GUIDE VASC ACCESS RIGHT  08/11/2020      Allergies: Patient has no known allergies.  Medications: Prior to Admission medications   Medication Sig Start Date End Date Taking? Authorizing Provider  allopurinol (ZYLOPRIM) 100 MG tablet Take 100 mg by mouth daily.   Yes [provider]  diltiazem (CARDIZEM CD) 240 MG 24 hr capsule Take 1 capsule (240 mg total) by mouth daily. 08/24/20 11/22/20 Yes Lorretta Harp, MD  fenofibrate (TRICOR) 145 MG tablet Take 145 mg by mouth daily.   Yes [provider]  finasteride (PROSCAR) 5 MG tablet Take 5 mg by mouth daily.   Yes [provider]  furosemide (LASIX) 40 MG tablet Take 2 tablets (80 mg total) by mouth 2 (two) times daily. 08/24/20  Yes Lorretta Harp, MD  hydrALAZINE (APRESOLINE) 50 MG tablet Take 1 tablet (50 mg total) by mouth 3 (three) times daily. 08/24/20 11/22/20 Yes Lorretta Harp, MD  KLOR-CON M20 20 MEQ tablet TAKE 1 TABLET BY MOUTH TWICE A DAY 08/19/20  Yes Harle Stanford., PA-C  lidocaine-prilocaine (EMLA) cream Apply 1 application topically as needed. 03/29/20  Yes Alla Feeling, NP  magnesium oxide (MAG-OX) 400 MG tablet Take 1 tablet (400 mg total) by mouth daily. 05/31/20  Yes [name], MD  metFORMIN (GLUCOPHAGE-XR) 500 MG 24 hr tablet 1 day or 1 dose. 06/04/20  Yes [provider]  metoprolol succinate (TOPROL XL) 25 MG 24 hr tablet Take 1 tablet (25 mg total) by mouth daily. 06/23/20  Yes Kilroy, Luke K, PA-C  ondansetron (ZOFRAN) 8 MG tablet  Take 1 tablet (8 mg total) by mouth 2 (two) times daily as needed. Start on the third day after chemotherapy. 05/03/20  Yes [name], MD  pantoprazole (PROTONIX) 40 MG tablet Take 1 tablet (40 mg total) by mouth 2 (two) times daily. 05/03/20  Yes [name], MD  pravastatin (PRAVACHOL) 40 MG tablet Take 40 mg by mouth daily.   Yes [provider]  sucralfate (CARAFATE) 1 g tablet Take 1 tablet (1 g total)  by mouth every 6 (six) hours as needed. Please schedule a yearly follow up for further refills: [phone] 06/15/20  Yes [name], [name], MD  tamsulosin (FLOMAX) 0.4 MG CAPS capsule Take 0.4 mg by mouth daily.   Yes [provider]  Vitamin D, Ergocalciferol, (DRISDOL) 1.25 MG (50000 UT) CAPS capsule Take 50,000 Units by mouth every 7 (seven) days.   Yes [provider]  apixaban (ELIQUIS) 5 MG TABS tablet Take 1 tablet (5 mg total) by mouth 2 (two) times daily. 03/24/20   Lorretta Harp, MD  colchicine 0.6 MG tablet Take 1 tablet (0.6 mg total) by mouth daily as needed (gout flare). Take daily for 3 days, then as needed for gout flare 05/22/20 08/24/20  Rai, Vernelle Emerald, MD     Vital Signs: BP (!) 151/84 (BP Location: Right Arm)   Pulse 75   Temp 98 F (36.7 C) (Oral)   Resp 18   SpO2 97%   Physical Exam awake, alert.  Chest clear to auscultation bilaterally.  Clean, intact right chest wall Port-A-Cath.  Heart with normal rate, irregular rhythm.  Abdomen protuberant, positive bowel sounds, nontender.  Bilateral lower extremity edema noted.  Imaging: No results found.  Labs:  CBC: Recent Labs    07/12/20 0815 07/19/20 0830 08/11/20 0814 08/26/20 0820  WBC 5.9 3.5* 6.0 6.7  HGB 8.9* 8.4* 10.0* 10.5*  HCT 27.2* 25.8* 30.8* 31.6*  PLT 224 216 267 211    COAGS: Recent Labs    02/23/20 1026 08/11/20 0814  INR 1.1 1.1    BMP: Recent Labs    06/28/20 0847 07/12/20 0815 07/19/20 0830 08/11/20 0814  NA 138 141 139 138  K 4.0 3.6 3.3* 4.1  CL 108 108 107 107  CO2 23 23 22 23   GLUCOSE 102* 97 102* 98  BUN 26* 27* 23 30*  CALCIUM 9.6 9.9 9.4 8.3*  CREATININE 1.69* 1.65* 1.52* 1.80*  GFRNONAA 40* 41* 45* 37*  GFRAA 46* 47* 52* 43*    LIVER FUNCTION TESTS: Recent Labs    06/28/20 0847 07/12/20 0815 07/19/20 0830 08/11/20 0814  BILITOT 0.6 0.7 0.9 0.6  AST 22 21 32 23  ALT 12 10 15 13   ALKPHOS 48 43 54 31*  PROT 6.2* 6.1* 6.2* 6.2*    ALBUMIN 3.2* 3.3* 3.1* 3.7    Assessment and Plan: Patient with history of diabetes, GERD, renal insufficiency, hyperlipidemia, hypertension, atrial fibrillation, 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  This interventional procedure involves the use of X-rays and because of the nature of the planned procedure, it is possible that we will have prolonged use of X-ray fluoroscopy.  Potential radiation risks to you include (but are not limited to) the following: - A slightly elevated risk for cancer  several years  later in life. This risk is typically less than 0.5% percent. This risk is low in comparison to the normal incidence of human cancer, which is 33% for women and 50% for men according to the [place]. - Radiation induced injury can include skin redness, resembling a rash, tissue breakdown / ulcers and hair loss (which can be temporary or permanent).   The likelihood of either of these occurring depends on the difficulty of the procedure and whether you are sensitive to radiation due to previous procedures, disease, or genetic conditions.   IF your procedure requires a prolonged use of radiation, you will be notified and given written instructions for further action.  It is your responsibility to monitor the irradiated area for the 2 weeks following the procedure and to notify your physician if you are concerned that you have suffered a radiation induced injury.    All of the patient's questions were answered, patient is agreeable to proceed.  Consent signed and in chart.  Creatinine today 1.94, total bilirubin 0.9      Electronically  Signed: D. Rowe Robert, PA-C 08/26/2020, 8:39 AM   I spent a total of 20 minutes at the the patient's bedside AND on the patient's hospital floor or unit, greater than 50% of which was counseling/coordinating care for hepatic arteriogram with Y 90 radioembolization/segment 7 segmentectomy

## 2020-08-26 NOTE — Sedation Documentation (Signed)
Transferred to nuc med for study 

## 2020-08-26 NOTE — Sedation Documentation (Signed)
Transferred to short stay  

## 2020-08-26 NOTE — Procedures (Signed)
Interventional Radiology Procedure Note  Procedure: Right lobar TARE with U76  Complications: None  Estimated Blood Loss: None  Recommendations: - DC home   Signed,  Criselda Peaches, MD

## 2020-09-02 ENCOUNTER — Telehealth: Payer: Self-pay | Admitting: Hematology

## 2020-09-02 DIAGNOSIS — R0600 Dyspnea, unspecified: Secondary | ICD-10-CM | POA: Diagnosis not present

## 2020-09-02 NOTE — Telephone Encounter (Signed)
Called pt per 10/13 sch msg - no answer. Left message for patient to call back to reschedule appt.

## 2020-09-03 LAB — BASIC METABOLIC PANEL
BUN/Creatinine Ratio: 17 (ref 10–24)
BUN: 34 mg/dL — ABNORMAL HIGH (ref 8–27)
CO2: 21 mmol/L (ref 20–29)
Calcium: 9.8 mg/dL (ref 8.6–10.2)
Chloride: 96 mmol/L (ref 96–106)
Creatinine, Ser: 2.04 mg/dL — ABNORMAL HIGH (ref 0.76–1.27)
GFR calc Af Amer: 37 mL/min/{1.73_m2} — ABNORMAL LOW (ref >59)
GFR calc non Af Amer: 32 mL/min/{1.73_m2} — ABNORMAL LOW (ref >59)
Glucose: 81 mg/dL (ref 65–99)
Potassium: 5.3 mmol/L — ABNORMAL HIGH (ref 3.5–5.2)
Sodium: 136 mmol/L (ref 134–144)

## 2020-09-06 ENCOUNTER — Telehealth: Payer: Self-pay | Admitting: Cardiovascular Disease

## 2020-09-06 DIAGNOSIS — Z1211 Encounter for screening for malignant neoplasm of colon: Secondary | ICD-10-CM | POA: Diagnosis not present

## 2020-09-06 DIAGNOSIS — I10 Essential (primary) hypertension: Secondary | ICD-10-CM | POA: Diagnosis not present

## 2020-09-06 DIAGNOSIS — E1122 Type 2 diabetes mellitus with diabetic chronic kidney disease: Secondary | ICD-10-CM | POA: Diagnosis not present

## 2020-09-06 DIAGNOSIS — N183 Chronic kidney disease, stage 3 unspecified: Secondary | ICD-10-CM

## 2020-09-06 DIAGNOSIS — R972 Elevated prostate specific antigen [PSA]: Secondary | ICD-10-CM | POA: Diagnosis not present

## 2020-09-06 DIAGNOSIS — I129 Hypertensive chronic kidney disease with stage 1 through stage 4 chronic kidney disease, or unspecified chronic kidney disease: Secondary | ICD-10-CM | POA: Diagnosis not present

## 2020-09-06 DIAGNOSIS — E785 Hyperlipidemia, unspecified: Secondary | ICD-10-CM | POA: Diagnosis not present

## 2020-09-06 DIAGNOSIS — N182 Chronic kidney disease, stage 2 (mild): Secondary | ICD-10-CM | POA: Diagnosis not present

## 2020-09-06 DIAGNOSIS — M109 Gout, unspecified: Secondary | ICD-10-CM | POA: Diagnosis not present

## 2020-09-06 DIAGNOSIS — C221 Intrahepatic bile duct carcinoma: Secondary | ICD-10-CM | POA: Diagnosis not present

## 2020-09-06 DIAGNOSIS — E559 Vitamin D deficiency, unspecified: Secondary | ICD-10-CM | POA: Diagnosis not present

## 2020-09-06 DIAGNOSIS — I4891 Unspecified atrial fibrillation: Secondary | ICD-10-CM | POA: Diagnosis not present

## 2020-09-06 NOTE — Telephone Encounter (Signed)
Left a message for the pts wife (on [place]), to call the office back.

## 2020-09-06 NOTE — Telephone Encounter (Signed)
Called patient's wife no answer.Left message on personal voice mail lab results.Advised to decrease Lasix to 40 mg twice a day.Repeat bmet in 1 week.

## 2020-09-06 NOTE — Telephone Encounter (Signed)
Wife of pt returned call to [place] for lab results , best number is [phone]

## 2020-09-08 ENCOUNTER — Other Ambulatory Visit: Payer: Self-pay

## 2020-09-08 DIAGNOSIS — C221 Intrahepatic bile duct carcinoma: Secondary | ICD-10-CM

## 2020-09-14 ENCOUNTER — Other Ambulatory Visit: Payer: Self-pay | Admitting: Interventional Radiology

## 2020-09-14 ENCOUNTER — Other Ambulatory Visit: Payer: Self-pay | Admitting: Medical

## 2020-09-14 ENCOUNTER — Other Ambulatory Visit (HOSPITAL_COMMUNITY)
Admission: RE | Admit: 2020-09-14 | Discharge: 2020-09-14 | Disposition: A | Discharge status: Home / Self Care | Payer: Medicare HMO | Source: Ambulatory Visit | Attending: Interventional Radiology | Admitting: Interventional Radiology

## 2020-09-14 DIAGNOSIS — I10 Essential (primary) hypertension: Secondary | ICD-10-CM | POA: Diagnosis not present

## 2020-09-14 DIAGNOSIS — C221 Intrahepatic bile duct carcinoma: Secondary | ICD-10-CM | POA: Diagnosis not present

## 2020-09-14 DIAGNOSIS — N183 Chronic kidney disease, stage 3 unspecified: Secondary | ICD-10-CM | POA: Diagnosis not present

## 2020-09-14 LAB — COMPREHENSIVE METABOLIC PANEL
ALT: 16 U/L (ref 0–44)
AST: 25 U/L (ref 15–41)
Albumin: 3.5 g/dL (ref 3.5–5.0)
Alkaline Phosphatase: 42 U/L (ref 38–126)
Anion gap: 10 (ref 5–15)
BUN: 22 mg/dL (ref 8–23)
CO2: 24 mmol/L (ref 22–32)
Calcium: 8.9 mg/dL (ref 8.9–10.3)
Chloride: 104 mmol/L (ref 98–111)
Creatinine, Ser: 1.58 mg/dL — ABNORMAL HIGH (ref 0.61–1.24)
GFR, Estimated: 46 mL/min — ABNORMAL LOW (ref >60)
Glucose, Bld: 101 mg/dL — ABNORMAL HIGH (ref 70–99)
Potassium: 3.6 mmol/L (ref 3.5–5.1)
Sodium: 138 mmol/L (ref 135–145)
Total Bilirubin: 0.6 mg/dL (ref 0.3–1.2)
Total Protein: 6.6 g/dL (ref 6.5–8.1)

## 2020-09-14 LAB — CBC
HCT: 33.5 % — ABNORMAL LOW (ref 39.0–52.0)
Hemoglobin: 10.9 g/dL — ABNORMAL LOW (ref 13.0–17.0)
MCH: 34.3 pg — ABNORMAL HIGH (ref 26.0–34.0)
MCHC: 32.5 g/dL (ref 30.0–36.0)
MCV: 105.3 fL — ABNORMAL HIGH (ref 80.0–100.0)
Platelets: 229 10*3/uL (ref 150–400)
RBC: 3.18 MIL/uL — ABNORMAL LOW (ref 4.22–5.81)
RDW: 13.9 % (ref 11.5–15.5)
WBC: 4.6 10*3/uL (ref 4.0–10.5)
nRBC: 0 % (ref 0.0–0.2)

## 2020-09-14 LAB — PROTIME-INR
INR: 1.2 (ref 0.8–1.2)
Prothrombin Time: 14.6 seconds (ref 11.4–15.2)

## 2020-09-14 NOTE — Telephone Encounter (Signed)
Dr. Burr Medico, Sending to you for approval or refusal as it was originally sent to [facility]. Gardiner Rhyme, RN

## 2020-09-15 LAB — BASIC METABOLIC PANEL
BUN/Creatinine Ratio: 14 (ref 10–24)
BUN: 21 mg/dL (ref 8–27)
CO2: 20 mmol/L (ref 20–29)
Calcium: 8.8 mg/dL (ref 8.6–10.2)
Chloride: 105 mmol/L (ref 96–106)
Creatinine, Ser: 1.54 mg/dL — ABNORMAL HIGH (ref 0.76–1.27)
GFR calc Af Amer: 51 mL/min/{1.73_m2} — ABNORMAL LOW (ref >59)
GFR calc non Af Amer: 44 mL/min/{1.73_m2} — ABNORMAL LOW (ref >59)
Glucose: 100 mg/dL — ABNORMAL HIGH (ref 65–99)
Potassium: 3.9 mmol/L (ref 3.5–5.2)
Sodium: 140 mmol/L (ref 134–144)

## 2020-09-15 LAB — AFP TUMOR MARKER: AFP, Serum, Tumor Marker: 9.1 ng/mL — ABNORMAL HIGH (ref 0.0–8.3)

## 2020-09-20 ENCOUNTER — Encounter: Payer: Self-pay | Admitting: Pharmacist

## 2020-09-20 ENCOUNTER — Other Ambulatory Visit: Payer: Self-pay

## 2020-09-20 ENCOUNTER — Ambulatory Visit (INDEPENDENT_AMBULATORY_CARE_PROVIDER_SITE_OTHER): Payer: Medicare HMO | Admitting: Pharmacist

## 2020-09-20 VITALS — BP 156/80 | HR 85 | Resp 15 | Ht 69.0 in | Wt 217.4 lb

## 2020-09-20 DIAGNOSIS — I10 Essential (primary) hypertension: Secondary | ICD-10-CM | POA: Diagnosis not present

## 2020-09-20 MED ORDER — POTASSIUM CHLORIDE CRYS ER 20 MEQ PO TBCR: 20.0000 meq | EXTENDED_RELEASE_TABLET | Freq: Two times a day (BID) | ORAL | 1 refills | Status: DC

## 2020-09-20 MED ORDER — HYDRALAZINE HCL 50 MG PO TABS: ORAL_TABLET | ORAL | 1 refills | Status: DC

## 2020-09-20 NOTE — Progress Notes (Signed)
Patient ID: Brandon Sherman                 DOB: 04/06/1946                      MRN: [id number]     HPI: Brandon Sherman is a 74 y.o. male referred by Dr. Gwenlyn Found to HTN clinic. Patient wanted to verify what medications they should be on and provider referred patient to [facility] for [facility]. BP meds that pt was previously on include amlodipine and lisinopril. Amlodipine was switched to diltiazem d/t pt having AFib. Lisinopril was d/c since patient is currently on chemotherapy and experienced an increase in SCr. He did not bring in his medication bottles for [place] to compare to his current med list, but he brought in his printed medication list that they have edited as medications were added and d/c. Sleep 6-7 hours a night and nap during the day.  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 3-4 bottle of water per day.  Diet: cut off sodium except for want  Meal (fried chicken, [place]).   Exercise: activist of daily living  Home BP readings: none providede  Wt Readings from Last 3 Encounters:  09/20/20 217 lb 6.4 oz (98.6 kg)  08/24/20 215 lb (97.5 kg)  07/12/20 216 lb 9.6 oz (98.2 kg)   BP Readings from Last 3 Encounters:  09/20/20 (!) 156/80  08/26/20 138/82  08/24/20 136/80   Pulse Readings from Last 3 Encounters:  09/20/20 85  08/26/20 72  08/24/20 78    Renal function: Estimated Creatinine Clearance: 49 mL/min (A) (by C-G formula based on SCr of 1.58 mg/dL (H)).  Past Medical History:  Diagnosis Date  . Arthritis   . Diabetes ([place])   . GERD (gastroesophageal reflux disease)   . Hyperlipidemia   . Hypertension   . Intrahepatic cholangiocarcinoma ([place])     Current Outpatient Medications on File Prior to Visit    Medication Sig Dispense Refill  . allopurinol (ZYLOPRIM) 100 MG tablet Take 100 mg by mouth daily.    Marland Kitchen apixaban (ELIQUIS) 5 MG TABS tablet Take 1 tablet (5 mg total) by mouth 2 (two) times daily. 180 tablet 3  . colchicine 0.6 MG tablet Take 1 tablet (0.6 mg total) by mouth daily as needed (gout flare). Take daily for 3 days, then as needed for gout flare 30 tablet 0  . diltiazem (CARDIZEM CD) 240 MG 24 hr capsule Take 1 capsule (240 mg total) by mouth daily. 90 capsule 3  . fenofibrate (TRICOR) 145 MG tablet Take 145 mg by mouth daily.    . finasteride (PROSCAR) 5 MG tablet Take 5 mg by mouth daily.    . furosemide (LASIX) 40 MG tablet Take 40 mg twice a day 90 tablet 3  . lidocaine-prilocaine (EMLA) cream Apply 1 application topically as needed. 30 g 3  . magnesium oxide (MAG-OX) 400 MG tablet Take 1 tablet (400 mg total) by mouth daily. 30 tablet 0  . metoprolol succinate (TOPROL XL) 25 MG 24 hr tablet Take 1 tablet (25 mg total) by mouth daily. 90 tablet 3  . ondansetron (ZOFRAN) 8 MG tablet Take 1 tablet (8 mg total) by mouth 2 (two)  times daily as needed. Start on the third day after chemotherapy. 30 tablet 1  . pantoprazole (PROTONIX) 40 MG tablet TAKE 1 TABLET BY MOUTH TWICE A DAY 180 tablet 0  . pravastatin (PRAVACHOL) 40 MG tablet Take 40 mg by mouth daily.    . sucralfate (CARAFATE) 1 g tablet Take 1 tablet (1 g total) by mouth every 6 (six) hours as needed. Please schedule a yearly follow up for further refills: [phone] 60 tablet 0  . tamsulosin (FLOMAX) 0.4 MG CAPS capsule Take 0.4 mg by mouth daily.    . Vitamin D, Ergocalciferol, (DRISDOL) 1.25 MG (50000 UT) CAPS capsule Take 50,000 Units by mouth every 7 (seven) days.    Marland Kitchen KLOR-CON M20 20 MEQ tablet TAKE 1 TABLET BY MOUTH TWICE A DAY 60 tablet 1   No current facility-administered medications on file prior to visit.    No Known Allergies  Blood pressure (!) 156/80, pulse 85, resp. rate 15, height 5\' 9"  (1.753 m), weight  217 lb 6.4 oz (98.6 kg), SpO2 96 %.  Essential hypertension Blood pressure remains above goal, but patient is doing great with lifestyle modifications and medication compliance. Will increase hydralazine to 100mg  every morning and evening, and 50mg  at noon. He already has a follow up scheduled with [name] in 2 weeks. Message sent to [name] (Care Guide) to assist with tobacco cessation process.    Danajah Birdsell Rodriguez-Guzman PharmD, BCPS, [place] [id number] 09/21/2020 3:50 PM

## 2020-09-20 NOTE — Patient Instructions (Addendum)
Return for a follow up appointment in 2 weeks with Brandon Sherman  Check your blood pressure at home daily (if able) and keep record of the readings.  Take your BP meds as follows: * INCREASE hydralazine to 100mg  every morning, 50mg  at noon, and 100mg  every evening*  *Continue working on low sodium diet*  *[name] - will contact you for tobacco cessation process  Bring all of your meds, your BP cuff and your record of home blood pressures to your next appointment.  Exercise as you're able, try to walk approximately 30 minutes per day.  Keep salt intake to a minimum, especially watch canned and prepared boxed foods.  Eat more fresh fruits and vegetables and fewer canned items.  Avoid eating in fast food restaurants.    HOW TO TAKE YOUR BLOOD PRESSURE: . Rest 5 minutes before taking your blood pressure. .  Don't smoke or drink caffeinated beverages for at least 30 minutes before. . Take your blood pressure before (not after) you eat. . Sit comfortably with your back supported and both feet on the floor (don't cross your legs). . Elevate your arm to heart level on a table or a desk. . Use the proper sized cuff. It should fit smoothly and snugly around your bare upper arm. There should be enough room to slip a fingertip under the cuff. The bottom edge of the cuff should be 1 inch above the crease of the elbow. . Ideally, take 3 measurements at one sitting and record the average.

## 2020-09-21 ENCOUNTER — Encounter: Payer: Self-pay | Admitting: Pharmacist

## 2020-09-21 NOTE — Assessment & Plan Note (Addendum)
Blood pressure remains above goal, but patient is doing great with lifestyle modifications and medication compliance. Will increase hydralazine to 100mg  every morning and evening, and 50mg  at noon. He already has a follow up scheduled with [name] in 2 weeks. Message sent to [place] (Care Guide) to assist with tobacco cessation process.   Patient was instructed to bring home BP cuff and readings to f/u appointment for assessment.

## 2020-09-24 DIAGNOSIS — Z23 Encounter for immunization: Secondary | ICD-10-CM | POA: Diagnosis not present

## 2020-09-30 ENCOUNTER — Ambulatory Visit
Admission: RE | Admit: 2020-09-30 | Discharge: 2020-09-30 | Disposition: A | Discharge status: Home / Self Care | Payer: Medicare HMO | Source: Ambulatory Visit | Attending: Interventional Radiology | Admitting: Interventional Radiology

## 2020-09-30 ENCOUNTER — Other Ambulatory Visit: Payer: Self-pay

## 2020-09-30 DIAGNOSIS — C221 Intrahepatic bile duct carcinoma: Secondary | ICD-10-CM | POA: Diagnosis not present

## 2020-09-30 DIAGNOSIS — Z9889 Other specified postprocedural states: Secondary | ICD-10-CM | POA: Diagnosis not present

## 2020-09-30 HISTORY — PX: IR RADIOLOGIST EVAL & MGMT: IMG5224

## 2020-09-30 NOTE — Progress Notes (Signed)
Chief Complaint: Patient was seen in follow-up remotely today (TeleHealth) for intrahepatic cholangiocarcinoma at the request of Laray Corbit K.    Referring Physician(s): Dr. [name]  History of Present Illness: Brandon Sherman is a 74 y.o. 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  He underwent biopsy in September 2020 which confirmed adenocarcinoma consistent with cholangiocarcinoma.  Based on lesion size, his clinical stage was cT2, cN0, cM0 stage II.  He began cisplatin and gemcitabine therapy in November 2020.  Cisplatin was subsequently held beginning in May 2021 due to issues with atrial fibrillation and congestive heart failure.  He is now on maintenance gemcitabine.  His last dose was 07/12/2020.  He underwent a right lobar transarterial radio embolization with Y 90 on 08/26/2020. I spoke with him today over the telephone. He was pleased to report that his postoperative course has been very easy. He denies experiencing any pain, fatigue, nausea, vomiting or other symptoms following the procedure. He feels like he is at his baseline and fully recovered. He has no specific complaints at this time.  Past Medical History:  Diagnosis Date  . Arthritis   . Diabetes ([place])   . GERD (gastroesophageal reflux disease)   . Hyperlipidemia   . Hypertension   . Intrahepatic cholangiocarcinoma [facility])     Past Surgical History:  Procedure Laterality Date  . APPENDECTOMY  1980  . CARDIOVERSION N/A 04/27/2020   Procedure: CARDIOVERSION;  Surgeon: Dorothy Spark, MD;  Location: [facility] ENDOSCOPY;  Service: Cardiovascular;  Laterality: N/A;  . CARDIOVERSION N/A 05/19/2020   Procedure: CARDIOVERSION;  Surgeon: Elouise Munroe, MD;  Location: [facility] ENDOSCOPY;  Service: Cardiovascular;  Laterality: N/A;  . COLONOSCOPY    . IR 3D INDEPENDENT WKST  08/11/2020  . IR 3D INDEPENDENT WKST  08/11/2020  . IR ANGIOGRAM SELECTIVE EACH ADDITIONAL VESSEL   08/11/2020  . IR ANGIOGRAM SELECTIVE EACH ADDITIONAL VESSEL  08/11/2020  . IR ANGIOGRAM SELECTIVE EACH ADDITIONAL VESSEL  08/11/2020  . IR ANGIOGRAM SELECTIVE EACH ADDITIONAL VESSEL  08/11/2020  . IR ANGIOGRAM SELECTIVE EACH ADDITIONAL VESSEL  08/11/2020  . IR ANGIOGRAM SELECTIVE EACH ADDITIONAL VESSEL  08/11/2020  . IR ANGIOGRAM SELECTIVE EACH ADDITIONAL VESSEL  08/26/2020  . IR ANGIOGRAM SELECTIVE EACH ADDITIONAL VESSEL  08/26/2020  . IR ANGIOGRAM VISCERAL SELECTIVE  08/11/2020  . IR ANGIOGRAM VISCERAL SELECTIVE  08/26/2020  . IR EMBO ARTERIAL NOT HEMORR HEMANG INC GUIDE ROADMAPPING  08/11/2020  . IR EMBO TUMOR ORGAN ISCHEMIA INFARCT INC GUIDE ROADMAPPING  08/26/2020  . IR IMAGING GUIDED PORT INSERTION  02/23/2020  . IR RADIOLOGIST EVAL & MGMT  07/14/2020  . IR US GUIDE VASC ACCESS LEFT  08/26/2020  . IR US GUIDE VASC ACCESS RIGHT  08/11/2020    Allergies: Patient has no known allergies.  Medications: Prior to Admission medications   Medication Sig Start Date End Date Taking? Authorizing Provider  allopurinol (ZYLOPRIM) 100 MG tablet Take 100 mg by mouth daily.    [provider]  apixaban (ELIQUIS) 5 MG TABS tablet Take 1 tablet (5 mg total) by mouth 2 (two) times daily. 03/24/20   Lorretta Harp, MD  colchicine 0.6 MG tablet Take 1 tablet (0.6 mg total) by mouth daily as needed (gout flare). Take daily for 3 days, then as needed for gout flare 05/22/20 09/20/21  Rai, Vernelle Emerald, MD  diltiazem (CARDIZEM CD) 240 MG 24 hr capsule Take 1 capsule (240 mg total) by mouth daily. 08/24/20 11/22/20  Lorretta Harp, MD  fenofibrate (TRICOR) 145 MG tablet Take 145 mg by mouth daily.    [provider]  finasteride (PROSCAR) 5 MG tablet Take 5 mg by mouth daily.    [provider]  furosemide (LASIX) 40 MG tablet Take 40 mg twice a day 09/06/20   Lorretta Harp, MD  hydrALAZINE (APRESOLINE) 50 MG tablet Take 2 tablets (100 mg total) by mouth in the morning AND 1 tablet (50 mg total)  daily with lunch AND 2 tablets (100 mg total) every evening. 09/20/20   Lorretta Harp, MD  KLOR-CON M20 20 MEQ tablet TAKE 1 TABLET BY MOUTH TWICE A DAY 09/21/20   Alla Feeling, NP  lidocaine-prilocaine (EMLA) cream Apply 1 application topically as needed. 03/29/20   Alla Feeling, NP  magnesium oxide (MAG-OX) 400 MG tablet Take 1 tablet (400 mg total) by mouth daily. 05/31/20   [name], MD  metoprolol succinate (TOPROL XL) 25 MG 24 hr tablet Take 1 tablet (25 mg total) by mouth daily. 06/23/20   Erlene Quan, PA-C  ondansetron (ZOFRAN) 8 MG tablet Take 1 tablet (8 mg total) by mouth 2 (two) times daily as needed. Start on the third day after chemotherapy. 05/03/20   [name], MD  pantoprazole (PROTONIX) 40 MG tablet TAKE 1 TABLET BY MOUTH TWICE A DAY 08/31/20   [name], MD  potassium chloride SA (KLOR-CON M20) 20 MEQ tablet Take 1 tablet (20 mEq total) by mouth 2 (two) times daily. 09/20/20   Lorretta Harp, MD  pravastatin (PRAVACHOL) 40 MG tablet Take 40 mg by mouth daily.    [provider]  sucralfate (CARAFATE) 1 g tablet Take 1 tablet (1 g total) by mouth every 6 (six) hours as needed. Please schedule a yearly follow up for further refills: [phone] 06/15/20   Yetta Flock, MD  tamsulosin (FLOMAX) 0.4 MG CAPS capsule Take 0.4 mg by mouth daily.    [provider]  Vitamin D, Ergocalciferol, (DRISDOL) 1.25 MG (50000 UT) CAPS capsule Take 50,000 Units by mouth every 7 (seven) days.    [provider]     Family History  Problem Relation Age of Onset  . Heart attack Mother   . Stroke Father   . Colon cancer Neg Hx   . Esophageal cancer Neg Hx   . Stomach cancer Neg Hx   . Rectal cancer Neg Hx   . Colon polyps Neg Hx     Social History   Socioeconomic History  . Marital status: Married    Spouse name: Not on file  . Number of children: 3  . Years of education: Not on file  . Highest education level: Not on file  Occupational History   . Not on file  Tobacco Use  . Smoking status: Never Smoker  . Smokeless tobacco: Current User    Types: Chew  . Tobacco comment: for 60 years, 1-2 daily   Vaping Use  . Vaping Use: Never used  Substance and Sexual Activity  . Alcohol use: Not Currently    Comment: occasionally  . Drug use: Never  . Sexual activity: Not on file  Other Topics Concern  . Not on file  Social History Narrative  . Not on file   Social Determinants of Health   Financial Resource Strain:   . Difficulty of Paying Living Expenses: Not on file  Food Insecurity:   . Worried About Charity fundraiser in the  Last Year: Not on file  . Ran Out of Food in the Last Year: Not on file  Transportation Needs: No Transportation Needs  . Lack of Transportation (Medical): No  . Lack of Transportation (Non-Medical): No  Physical Activity: Insufficiently Active  . Days of Exercise per Week: 1 day  . Minutes of Exercise per Session: 50 min  Stress:   . Feeling of Stress : Not on file  Social Connections: Unknown  . Frequency of Communication with Friends and Family: More than three times a week  . Frequency of Social Gatherings with Friends and Family: More than three times a week  . Attends Religious Services: Not on file  . Active Member of Clubs or Organizations: Not on file  . Attends Archivist Meetings: Not on file  . Marital Status: Married    ECOG Status: 0 - Asymptomatic  Review of Systems  Review of Systems: A 12 point ROS discussed and pertinent positives are indicated in the HPI above.  All other systems are negative.  Physical Exam No direct physical exam was performed (except for noted visual exam findings with Video Visits).   Vital Signs: There were no vitals taken for this visit.  Imaging: No results found.  Labs:  CBC: Recent Labs    07/19/20 0830 08/11/20 0814 08/26/20 0820 09/14/20 1300  WBC 3.5* 6.0 6.7 4.6  HGB 8.4* 10.0* 10.5* 10.9*  HCT 25.8* 30.8* 31.6*  33.5*  PLT 216 267 211 229    COAGS: Recent Labs    02/23/20 1026 08/11/20 0814 08/26/20 0820 09/14/20 1300  INR 1.1 1.1 1.2 1.2    BMP: Recent Labs    07/19/20 0830 07/19/20 0830 08/11/20 0814 08/11/20 0814 08/26/20 0820 09/02/20 1208 09/14/20 1212 09/14/20 1300  NA 139   < > 138   < > 141 136 140 138  K 3.3*   < > 4.1   < > 3.6 5.3* 3.9 3.6  CL 107   < > 107   < > 104 96 105 104  CO2 22   < > 23   < > 23 21 20 24   GLUCOSE 102*   < > 98   < > 105* 81 100* 101*  BUN 23   < > 30*   < > 31* 34* 21 22  CALCIUM 9.4   < > 8.3*   < > 9.5 9.8 8.8 8.9  CREATININE 1.52*  --  1.80*   < > 1.94* 2.04* 1.54* 1.58*  GFRNONAA 45*  --  37*   < > 34* 32* 44* 46*  GFRAA 52*  --  43*  --   --  37* 51*  --    < > = values in this interval not displayed.    LIVER FUNCTION TESTS: Recent Labs    07/19/20 0830 08/11/20 0814 08/26/20 0820 09/14/20 1300  BILITOT 0.9 0.6 0.9 0.6  AST 32 23 19 25   ALT 15 13 11 16   ALKPHOS 54 31* 32* 42  PROT 6.2* 6.2* 6.8 6.6  ALBUMIN 3.1* 3.7 3.6 3.5    TUMOR MARKERS: No results for input(s): AFPTM, CEA, CA199, CHROMGRNA in the last 8760 hours.  Assessment and Plan:  Mr. Bazzi is now 1 month status post transarterial radio embolization with Y 90. His postoperative course was uneventful. He had no issues with post embolization syndrome or other adverse events. He is doing quite well.  1.) Follow-up MRI and clinic visit at 3 months post procedure,  in early January 2022.    Electronically Signed: Criselda Peaches 09/30/2020, 11:07 AM   I spent a total of 10 Minutes in remote  clinical consultation, greater than 50% of which was counseling/coordinating care for intrahepatic cholangiocarcinoma.    Visit type: Audio only (telephone). Audio (no video) only due to patient preference. Alternative for in-person consultation at [facility], [address], [place], [place]. This visit type was conducted due to national recommendations  for restrictions regarding the COVID-19 Pandemic (e.g. social distancing).  This format is felt to be most appropriate for this patient at this time.  All issues noted in this document were discussed and addressed.

## 2020-10-06 ENCOUNTER — Encounter: Payer: Self-pay | Admitting: Physician Assistant

## 2020-10-06 ENCOUNTER — Ambulatory Visit (INDEPENDENT_AMBULATORY_CARE_PROVIDER_SITE_OTHER): Payer: Medicare HMO | Admitting: Physician Assistant

## 2020-10-06 ENCOUNTER — Other Ambulatory Visit: Payer: Self-pay

## 2020-10-06 VITALS — BP 124/78 | HR 80 | Ht 69.0 in | Wt 211.0 lb

## 2020-10-06 DIAGNOSIS — I4811 Longstanding persistent atrial fibrillation: Secondary | ICD-10-CM | POA: Diagnosis not present

## 2020-10-06 DIAGNOSIS — E119 Type 2 diabetes mellitus without complications: Secondary | ICD-10-CM

## 2020-10-06 DIAGNOSIS — E785 Hyperlipidemia, unspecified: Secondary | ICD-10-CM | POA: Diagnosis not present

## 2020-10-06 DIAGNOSIS — I10 Essential (primary) hypertension: Secondary | ICD-10-CM

## 2020-10-06 DIAGNOSIS — C229 Malignant neoplasm of liver, not specified as primary or secondary: Secondary | ICD-10-CM

## 2020-10-06 NOTE — Patient Instructions (Signed)
Medication Instructions:  No change *If you need a refill on your cardiac medications before your next appointment, please call your pharmacy*   Lab Work: None ordered If you have labs (blood work) drawn today and your tests are completely normal, you will receive your results only by:  [place] (if you have MyChart) OR  A paper copy in the mail If you have any lab test that is abnormal or we need to change your treatment, we will call you to review the results.   Testing/Procedures: None ordered   Follow-Up: At [facility], you and your health needs are our priority.  As part of our continuing mission to provide you with exceptional heart care, we have created designated Provider Care Teams.  These Care Teams include your primary Cardiologist (physician) and Advanced Practice Providers (APPs -  Physician Assistants and Nurse Practitioners) who all work together to provide you with the care you need, when you need it.  We recommend signing up for the patient portal called "MyChart".  Sign up information is provided on this After Visit Summary.  MyChart is used to connect with patients for Virtual Visits (Telemedicine).  Patients are able to view lab/test results, encounter notes, upcoming appointments, etc.  Non-urgent messages can be sent to your provider as well.   To learn more about what you can do with MyChart, go to [email or web address].    Your next appointment:   5-6 month(s)  The format for your next appointment:   In Person  Provider:   Quay Burow, MD   Other Instructions None

## 2020-10-06 NOTE — Progress Notes (Signed)
Cardiology Office Note:    Date:  10/08/2020   ID:  Brandon Sherman, DOB May 26, 1946, MRN [id number]  PCP:  [name], [facility] Cardiologist:  Quay Burow, MD  [place] Electrophysiologist:  None   Referring MD: [name], PA   Chief Complaint  Patient presents with  . Follow-up    6-8 weeks.    History of Present Illness:    Brandon Sherman is a 74 y.o. male with a hx of HTN, HLD, DM 2, liver cancer and A. fib.  He has never had a heart attack or stroke in the past.  Unfortunately he was diagnosed with liver cancer in 2020 and started on chemotherapy with Port-A-Cath placement.  He was also noted to have new onset of atrial fibrillation as well and was started on Eliquis.  He underwent outpatient cardioversion by Dr. Meda Coffee on 04/27/2020 however went back into A. fib about a week later.  He has shortness of breath on exertion.  Echocardiogram showed normal EF.  Myoview was negative.  He was last seen by Dr. Alvester Chou on 08/24/2020 at which time he had fatigue and a lower extremity edema.  Hydralazine was increased to 50 mg 3 times a day, diltiazem increased to 240 mg daily.  Lasix increased to 80 mg twice a day.  Unfortunately, creatinine went up to greater than 2 after increased the Lasix dose.  Lasix was subsequently reduced to 40 mg twice daily.  Renal function has since improved back to normal range.  He was seen in the hypertension clinic on 09/20/2020, hydralazine increased to 100 mg every morning, 50 mg at noon and 100 mg every evening.  Patient presents today for follow-up.  Blood pressure is very well controlled on the current therapy.  He denies any recent chest pain or shortness of breath.  He has trace amount of lower extremity edema.  His lung is clear on exam.  His heart rate is very regular on physical exam, we got a EKG, however due to underlying artifact, it is hard to say if he was in 4:1 atrial flutter versus sinus rhythm with first-degree AV block.  It  would not change his medical therapy since he is on Eliquis and diltiazem.  He does have some weakness in his leg with walking.  However the symptom is not ipsilateral, and does not appear to be claudication symptom.  His lab work from 3 weeks ago showed a stable renal function, electrolyte and borderline low hemoglobin that is unchanged when compared to the previous lab work.  I recommended continue with exercise regimen and follow-up with Dr. Gwenlyn Found in April of next year.   Past Medical History:  Diagnosis Date  . Arthritis   . Diabetes ([place])   . GERD (gastroesophageal reflux disease)   . Hyperlipidemia   . Hypertension   . 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 APPENDECTOMY  1980  . CARDIOVERSION N/A 04/27/2020   Procedure: CARDIOVERSION;  Surgeon: Dorothy Spark, MD;  Location: [facility] ENDOSCOPY;  Service: Cardiovascular;  Laterality: N/A;  . CARDIOVERSION N/A 05/19/2020   Procedure: CARDIOVERSION;  Surgeon: Elouise Munroe, MD;  Location: [facility] ENDOSCOPY;  Service: Cardiovascular;  Laterality: N/A;  . COLONOSCOPY    . IR 3D INDEPENDENT WKST  08/11/2020  . IR 3D INDEPENDENT WKST  08/11/2020  . IR ANGIOGRAM SELECTIVE EACH ADDITIONAL VESSEL  08/11/2020  . IR ANGIOGRAM SELECTIVE EACH ADDITIONAL VESSEL  08/11/2020  . IR ANGIOGRAM SELECTIVE EACH ADDITIONAL VESSEL  08/11/2020  . IR ANGIOGRAM SELECTIVE EACH ADDITIONAL VESSEL  08/11/2020  . IR ANGIOGRAM SELECTIVE EACH ADDITIONAL VESSEL  08/11/2020  . IR ANGIOGRAM SELECTIVE EACH ADDITIONAL VESSEL  08/11/2020  . IR ANGIOGRAM SELECTIVE EACH ADDITIONAL VESSEL  08/26/2020  . IR ANGIOGRAM SELECTIVE EACH ADDITIONAL VESSEL  08/26/2020  . IR ANGIOGRAM VISCERAL SELECTIVE  08/11/2020  . IR ANGIOGRAM VISCERAL SELECTIVE  08/26/2020  . IR EMBO ARTERIAL NOT HEMORR HEMANG INC GUIDE ROADMAPPING  08/11/2020  . IR EMBO TUMOR ORGAN ISCHEMIA INFARCT INC GUIDE ROADMAPPING  08/26/2020  . IR IMAGING GUIDED PORT INSERTION   02/23/2020  . IR RADIOLOGIST EVAL & MGMT  07/14/2020  . IR RADIOLOGIST EVAL & MGMT  09/30/2020  . IR US GUIDE VASC ACCESS LEFT  08/26/2020  . IR US GUIDE VASC ACCESS RIGHT  08/11/2020    Current Medications: Current Meds  Medication Sig  . allopurinol (ZYLOPRIM) 100 MG tablet Take 100 mg by mouth daily.  Marland Kitchen apixaban (ELIQUIS) 5 MG TABS tablet Take 1 tablet (5 mg total) by mouth 2 (two) times daily.  . colchicine 0.6 MG tablet Take 1 tablet (0.6 mg total) by mouth daily as needed (gout flare). Take daily for 3 days, then as needed for gout flare  . diltiazem (CARDIZEM CD) 240 MG 24 hr capsule Take 1 capsule (240 mg total) by mouth daily.  . fenofibrate (TRICOR) 145 MG tablet Take 145 mg by mouth daily.  . finasteride (PROSCAR) 5 MG tablet Take 5 mg by mouth daily.  . furosemide (LASIX) 40 MG tablet Take 40 mg twice a day  . hydrALAZINE (APRESOLINE) 50 MG tablet Take 2 tablets (100 mg total) by mouth in the morning AND 1 tablet (50 mg total) daily with lunch AND 2 tablets (100 mg total) every evening.  Marland Kitchen KLOR-CON M20 20 MEQ tablet TAKE 1 TABLET BY MOUTH TWICE A DAY  . lidocaine-prilocaine (EMLA) cream Apply 1 application topically as needed.  . magnesium oxide (MAG-OX) 400 MG tablet Take 1 tablet (400 mg total) by mouth daily.  . metoprolol succinate (TOPROL XL) 25 MG 24 hr tablet Take 1 tablet (25 mg total) by mouth daily.  . ondansetron (ZOFRAN) 8 MG tablet Take 1 tablet (8 mg total) by mouth 2 (two) times daily as needed. Start on the third day after chemotherapy.  . pantoprazole (PROTONIX) 40 MG tablet TAKE 1 TABLET BY MOUTH TWICE A DAY  . potassium chloride SA (KLOR-CON M20) 20 MEQ tablet Take 1 tablet (20 mEq total) by mouth 2 (two) times daily.  . pravastatin (PRAVACHOL) 40 MG tablet Take 40 mg by mouth daily.  . sucralfate (CARAFATE) 1 g tablet Take 1 tablet (1 g total) by mouth every 6 (six) hours as needed. Please schedule a yearly follow up for further refills: [phone]  .  tamsulosin (FLOMAX) 0.4 MG CAPS capsule Take 0.4 mg by mouth daily.  . Vitamin D, Ergocalciferol, (DRISDOL) 1.25 MG (50000 UT) CAPS capsule Take 50,000 Units by mouth every 7 (seven) days.     Allergies:   Patient has no known allergies.   Social History   Socioeconomic History  . Marital status: Married    Spouse name: Not on file  . Number of children: 3  . Years of education: Not on file  . Highest education level: Not on file  Occupational History  . Not on file  Tobacco Use  . Smoking status: Never Smoker  . Smokeless  tobacco: Current User    Types: Chew  . Tobacco comment: for 60 years, 1-2 daily   Vaping Use  . Vaping Use: Never used  Substance and Sexual Activity  . Alcohol use: Not Currently    Comment: occasionally  . Drug use: Never  . Sexual activity: Not on file  Other Topics Concern  . Not on file  Social History Narrative  . Not on file   Social Determinants of Health   Financial Resource Strain:   . Difficulty of Paying Living Expenses: Not on file  Food Insecurity:   . Worried About Charity fundraiser in the Last Year: Not on file  . Ran Out of Food in the Last Year: Not on file  Transportation Needs: No Transportation Needs  . Lack of Transportation (Medical): No  . Lack of Transportation (Non-Medical): No  Physical Activity: Insufficiently Active  . Days of Exercise per Week: 1 day  . Minutes of Exercise per Session: 50 min  Stress:   . Feeling of Stress : Not on file  Social Connections: Unknown  . Frequency of Communication with Friends and Family: More than three times a week  . Frequency of Social Gatherings with Friends and Family: More than three times a week  . Attends Religious Services: Not on file  . Active Member of Clubs or Organizations: Not on file  . Attends Archivist Meetings: Not on file  . Marital Status: Married     Family History: The patient's family history includes Heart attack in his mother; Stroke in his  father. There is no history of Colon cancer, Esophageal cancer, Stomach cancer, Rectal cancer, or Colon polyps.  ROS:   Please see the history of present illness.     All other systems reviewed and are negative.  EKGs/Labs/Other Studies Reviewed:    The following studies were reviewed today:  Limited echo 05/20/2020 1. Left ventricular ejection fraction, by estimation, is 55 to 60%. The  left ventricle has normal function. The left ventricle has no regional  wall motion abnormalities. There is moderate left ventricular hypertrophy.  Left ventricular diastolic  parameters are indeterminate.  2. Right ventricular systolic function is normal. The right ventricular  size is normal. There is moderately elevated pulmonary artery systolic  pressure. The estimated right ventricular systolic pressure is 48.0 mmHg.  3. The mitral valve is grossly normal. Mild mitral valve regurgitation.  4. The aortic valve is tricuspid. Aortic valve regurgitation is mild.  Mild to moderate aortic valve sclerosis/calcification is present, without  any evidence of aortic stenosis.  5. Aortic dilatation noted. There is mild dilatation of the aortic root  measuring 41 mm.  6. The inferior vena cava is normal in size with <50% respiratory  variability, suggesting right atrial pressure of 8 mmHg.   Comparison(s): A prior study was performed on 04/13/20. No significant  change from prior study. Prior images reviewed side by side.   EKG:  EKG is ordered today.  The ekg ordered today demonstrates either 4:1 atrial flutter versus sinus rhythm with first-degree AV block.  Recent Labs: 05/20/2020: B Natriuretic Peptide 428.4; Magnesium 1.7; TSH 2.131 09/14/2020: ALT 16; BUN 22; Creatinine, Ser 1.58; Hemoglobin 10.9; Platelets 229; Potassium 3.6; Sodium 138  Recent Lipid Panel    Component Value Date/Time   CHOL 125 03/24/2020 1110   TRIG 95 03/24/2020 1110   HDL 40 03/24/2020 1110   CHOLHDL 3.1 03/24/2020 1110     LDLCALC 67 03/24/2020 1110  Risk Assessment/Calculations:     CHA2DS2-VASc Score = 3  This indicates a 3.2% annual risk of stroke. The patient's score is based upon: CHF History: 0 HTN History: 1 Diabetes History: 1 Stroke History: 0 Vascular Disease History: 0 Age Score: 1 Gender Score: 0      Physical Exam:    VS:  BP 124/78 (BP Location: Left Arm, Patient Position: Sitting, Cuff Size: Normal)   Pulse 80   Ht 5\' 9"  (1.753 m)   Wt 211 lb (95.7 kg)   BMI 31.16 kg/m     Wt Readings from Last 3 Encounters:  10/06/20 211 lb (95.7 kg)  09/20/20 217 lb 6.4 oz (98.6 kg)  08/24/20 215 lb (97.5 kg)     GEN:  Well nourished, well developed in no acute distress HEENT: Normal NECK: No JVD; No carotid bruits LYMPHATICS: No lymphadenopathy CARDIAC: RRR, no murmurs, rubs, gallops RESPIRATORY:  Clear to auscultation without rales, wheezing or rhonchi  ABDOMEN: Soft, non-tender, non-distended MUSCULOSKELETAL:  No edema; No deformity  SKIN: Warm and dry NEUROLOGIC:  Alert and oriented x 3 PSYCHIATRIC:  Normal affect   ASSESSMENT:    1. Longstanding persistent atrial fibrillation ([place])   2. Essential hypertension   3. Hyperlipidemia LDL goal <70   4. Controlled type 2 diabetes mellitus without complication, without long-term current use of insulin ([place])   5. Malignant neoplasm of liver, unspecified liver malignancy type ([place])    PLAN:    In order of problems listed above:  1. Longstanding persistent atrial fibrillation: On Eliquis, rate controlled on diltiazem.  Based on EKG, it is unclear if he is in atrial flutter versus sinus rhythm today, either way he is very well rate controlled and anticoagulated  2. Hypertension: Blood pressure well controlled  3. Hyperlipidemia: On pravastatin  4. DM2: Managed by primary care provider  5. Liver cancer: Followed by oncology service.    Shared Decision Making/Informed Consent        Medication Adjustments/Labs and  Tests Ordered: Current medicines are reviewed at length with the patient today.  Concerns regarding medicines are outlined above.  Orders Placed This Encounter  Procedures  . EKG 12-Lead   No orders of the defined types were placed in this encounter.   Patient Instructions  Medication Instructions:  No change *If you need a refill on your cardiac medications before your next appointment, please call your pharmacy*   Lab Work: None ordered If you have labs (blood work) drawn today and your tests are completely normal, you will receive your results only by: Marland Kitchen MyChart Message (if you have MyChart) OR . A paper copy in the mail If you have any lab test that is abnormal or we need to change your treatment, we will call you to review the results.   Testing/Procedures: None ordered   Follow-Up: At [facility], you and your health needs are our priority.  As part of our continuing mission to provide you with exceptional heart care, we have created designated Provider Care Teams.  These Care Teams include your primary Cardiologist (physician) and Advanced Practice Providers (APPs -  Physician Assistants and Nurse Practitioners) who all work together to provide you with the care you need, when you need it.  We recommend signing up for the patient portal called "MyChart".  Sign up information is provided on this After Visit Summary.  MyChart is used to connect with patients for Virtual Visits (Telemedicine).  Patients are able to view lab/test results, encounter notes,  upcoming appointments, etc.  Non-urgent messages can be sent to your provider as well.   To learn more about what you can do with MyChart, go to [email or web address].    Your next appointment:   5-6 month(s)  The format for your next appointment:   In Person  Provider:   Quay Burow, MD   Other Instructions None     Signed, [name], [place]  10/08/2020 9:05 AM    [place]

## 2020-10-15 ENCOUNTER — Other Ambulatory Visit: Payer: Self-pay | Admitting: Cardiovascular Disease

## 2020-11-01 ENCOUNTER — Other Ambulatory Visit: Payer: Self-pay | Admitting: Hematology

## 2020-11-02 ENCOUNTER — Other Ambulatory Visit: Payer: Self-pay | Admitting: Hematology

## 2020-11-03 ENCOUNTER — Telehealth: Payer: Self-pay | Admitting: Hematology

## 2020-11-03 NOTE — Telephone Encounter (Signed)
Called pt per 12/14 sch msg - no answer. Left message for patient to call back to set up lab and f/u within the next month.

## 2020-11-05 ENCOUNTER — Ambulatory Visit: Payer: Medicare HMO | Admitting: Cardiovascular Disease

## 2020-11-12 ENCOUNTER — Other Ambulatory Visit: Payer: Self-pay | Admitting: Cardiovascular Disease

## 2020-11-16 ENCOUNTER — Other Ambulatory Visit: Payer: Self-pay | Admitting: Interventional Radiology

## 2020-11-16 DIAGNOSIS — C221 Intrahepatic bile duct carcinoma: Secondary | ICD-10-CM

## 2020-11-18 ENCOUNTER — Telehealth: Payer: Self-pay | Admitting: Hematology

## 2020-11-18 NOTE — Telephone Encounter (Signed)
Left message with added appointments per 12/30 schedule message. Gave option to call back to reschedule if needed.

## 2020-11-24 NOTE — Progress Notes (Signed)
[facility]   [phone] [phone]   Clinic Follow up Note   Patient Care Team: Marlyn Corporal, PA as PCP - General (Family Medicine) Runell Gess, MD as PCP - Cardiology (Cardiology) Almond Lint, MD as Consulting Physician (General Surgery) [name], Willaim Rayas, MD as Consulting Physician (Gastroenterology) Malachy Mood, MD as Consulting Physician (Hematology) Runell Gess, MD as Consulting Physician (Cardiology)  Date of Service:  11/26/2020  CHIEF COMPLAINT: F/u cholangiocarcinoma  SUMMARY OF ONCOLOGIC HISTORY: Oncology History Overview Note  Cancer Staging Intrahepatic cholangiocarcinoma (HCC) Staging form: Intrahepatic Bile Duct, AJCC 8th Edition - Clinical stage from 08/19/2019: Stage II (cT2, cN0, cM0) - Signed by Malachy Mood, MD on 08/19/2019    Intrahepatic cholangiocarcinoma (HCC)  06/28/2019 Imaging   CT AP W Contrast 06/28/19  IMPRESSION: 1. Heterogeneous hypodensity posteriorly in the right hepatic lobe and potentially extending into the caudate lobe suspicious for a mass. There is felt to be truncation of branches of the portal vein in this vicinity and some narrowing of the hepatic vein, as well as triangular-shaped regions of abnormal hypoenhancement posteriorly in the right hepatic lobe likely representing downstream vascular effects. Cannot exclude malignancy such as cholangiocarcinoma or hepatocellular carcinoma, and follow up hepatic protocol MRI with and without contrast is recommended to further characterize. 2. 4 mm right middle lobe pulmonary nodule is likely benign but may merit surveillance. 3. Cholelithiasis. 4.  Aortic Atherosclerosis (ICD10-I70.0). 5. Prostatomegaly. 6. Mild impingement at L3-4 and L4-5.   07/31/2019 Imaging   MRI Liver 07/31/19 IMPRESSION: 1. 7.3 cm in long axis mass in the right hepatic lobe spanning into the caudate lobe, high suspicion for malignancy such as hepatocellular carcinoma  or cholangiocarcinoma. Suspected effacement or occlusion of the right hepatic vein and posterior branches of the right portal vein. Two smaller tumor nodules along the posterior periphery of the dominant mass. Tissue diagnosis is recommended. 2. No findings of pathologic adenopathy or distant metastatic spread. 3. 9 mm gallstone in the gallbladder. There is mild gallbladder wall thickening which may be from nondistention, correlate clinically in assessing for cholecystitis. 4.  Aortic Atherosclerosis (ICD10-I70.0). 5. Mild diffuse hepatic steatosis.   08/11/2019 Initial Biopsy   DIAGNOSIS: 08/11/19  A. LIVER, RIGHT, BIOPSY:  - Adenocarcinoma.   08/18/2019 Imaging   CT Chest 08/18/19  IMPRESSION: 1. Multiple pulmonary nodules largest at approximately 7 mm in the right lower lobe, nonspecific but concerning given findings in the liver. 2. No signs of definitive metastatic disease, also with mildly enlarged upper abdominal lymph nodes as discussed.   Aortic Atherosclerosis (ICD10-I70.0).   08/19/2019 Initial Diagnosis   Intrahepatic cholangiocarcinoma (HCC)   08/19/2019 Cancer Staging   Staging form: Intrahepatic Bile Duct, AJCC 8th Edition - Clinical stage from 08/19/2019: Stage II (cT2, cN0, cM0) - Signed by Malachy Mood, MD on 08/19/2019   09/29/2019 - 07/19/2020 Chemotherapy   Cisplatin and Gemcitabine 2 weeks on/1 week off starting 09/29/19. Cisplatin held from cycle 9 (03/23/20) due to fluid status/Afib. He is now on maintenance Gemcitabine. Held after 07/19/20 to proceed with liver target therapy.     10/09/2019 Imaging   CT AP IMPRESSION: 1. The dominant right hepatic lobe mass is minimally reduced in size compared to prior exams, currently measuring 6.2 by 5.3 cm, previously 6.2 by 5.6 cm. However, there is a new small hypodense lesion centrally in the right hepatic lobe which is suspicious for a new small focus of tumor. Accordingly this is an overall mixed appearance. 2.  Continued  hypoenhancement in the liver downstream of the tumor likely attributable to narrowing or occlusion of the right hepatic vein by the tumor. By virtue of its location the tumor wraps around the intrahepatic portion of the IVC. 3. 4 mm right middle lobe pulmonary nodule, stable compared to earliest available comparison of 07/18/2019. Surveillance of the patient's pulmonary nodules is recommended. 4. Other imaging findings of potential clinical significance: Coronary atherosclerosis. Cholelithiasis. Prominent stool throughout the colon favors constipation. Moderate prostatomegaly with heterogeneous enhancement of the prostate gland. Lumbar spondylosis and degenerative disc disease causing mild bilateral foraminal impingement at L3-4 and L4-5.   Aortic Atherosclerosis (ICD10-I70.0).   12/24/2019 Imaging   CT CAP W Contrast  IMPRESSION: 1. Right hepatic lobe mass and adjacent right hepatic lobe nodules appear grossly stable. No evidence of distant metastatic disease. 2. Continued stability of small pulmonary nodules. Recommend attention on follow-up. 3. Cholelithiasis. 4. Enlarged prostate. 5. Aortic atherosclerosis (ICD10-I70.0). Coronary artery calcification.   02/23/2020 Procedure   He had PAC placed on 02/23/20.    04/08/2020 Imaging   CT CAP  IMPRESSION: 1. Stable or minimally decreased size of a very ill-defined, hypodense and somewhat retractile appearing mass of the central right lobe of the liver abutting the inferior vena cava and right portal vein, measuring approximately 5.7 x 5.0 cm, previously 6.2 x 5.2 cm when measured similarly. Findings are consistent with stable or minimally improved cholangiocarcinoma. 2. Small hypodense nodules of the right lobe identified on prior examination are poorly appreciated on this single phase contrast examination although not grossly changed. Attention on follow-up. 3. There are new, moderate bilateral pleural effusions and associated atelectasis  or consolidation as well as a new small pericardial effusion, nonspecific although generally concerning and suspicious for malignant effusions. There is no directly visualized pleural nodularity. 4. Multiple small pulmonary nodules are stable.  No new nodules. 5. Coronary artery disease. Aortic Atherosclerosis (ICD10-I70.0).   07/06/2020 Imaging   MRI ABDIMPRESSION: 1. Interval decrease in size of the right hepatic lobe lesion in the medial aspect of the segment 7. Findings would suggest a good response to treatment with some contraction of the tumor. 2. No new hepatic lesions. No abdominal adenopathy or metastatic disease. 3. Stable mild intrahepatic biliary dilatation in the right hepatic lobe distal to the lesion. 4. Somewhat tortuous and almost beaded appearance of the hepatic and portal vein radicles. Findings could be radiation related. 5. Cholelithiasis.  CT chest wo contrast IMPRESSION: 1. Persistent/stable moderate-sized bilateral pleural effusions with overlying atelectasis. 2. Stable small right pulmonary nodules. No new or progressive findings. Recommend continued surveillance. 3. No mediastinal or hilar mass or adenopathy. 4. Stable advanced three-vessel coronary artery calcifications. 5. Cholelithiasis. Aortic Atherosclerosis (ICD10-I70.0)   08/11/2020 Procedure   Y90 on 08/11/20 and 08/26/20 with Dr Laurence Ferrari       CURRENT THERAPY:  Observation   INTERVAL HISTORY:  Brandon Sherman is here for a follow up. He presents to the clinic with his wife. He notes he is doing well. He denies pain and notes he is eating adequately. He denies GI symptoms.   REVIEW OF SYSTEMS:   Constitutional: Denies fevers, chills or abnormal weight loss Eyes: Denies blurriness of vision Ears, nose, mouth, throat, and face: Denies mucositis or sore throat Respiratory: Denies cough, dyspnea or wheezes Cardiovascular: Denies palpitation, chest discomfort or lower extremity  swelling Gastrointestinal:  Denies nausea, heartburn or change in bowel habits Skin: Denies abnormal skin rashes Lymphatics: Denies new lymphadenopathy or easy bruising Neurological:Denies  numbness, tingling or new weaknesses Behavioral/Psych: Mood is stable, no new changes  All other systems were reviewed with the patient and are negative.  MEDICAL HISTORY:  Past Medical History:  Diagnosis Date  . Arthritis   . Diabetes ([place])   . GERD (gastroesophageal reflux disease)   . Hyperlipidemia   . Hypertension   . Intrahepatic cholangiocarcinoma ([place])     SURGICAL HISTORY: Past Surgical History:  Procedure Laterality Date  . APPENDECTOMY  1980  . CARDIOVERSION N/A 04/27/2020   Procedure: CARDIOVERSION;  Surgeon: Dorothy Spark, MD;  Location: [facility] ENDOSCOPY;  Service: Cardiovascular;  Laterality: N/A;  . CARDIOVERSION N/A 05/19/2020   Procedure: CARDIOVERSION;  Surgeon: Elouise Munroe, MD;  Location: [facility] ENDOSCOPY;  Service: Cardiovascular;  Laterality: N/A;  . COLONOSCOPY    . IR 3D INDEPENDENT WKST  08/11/2020  . IR 3D INDEPENDENT WKST  08/11/2020  . IR ANGIOGRAM SELECTIVE EACH ADDITIONAL VESSEL  08/11/2020  . IR ANGIOGRAM SELECTIVE EACH ADDITIONAL VESSEL  08/11/2020  . IR ANGIOGRAM SELECTIVE EACH ADDITIONAL VESSEL  08/11/2020  . IR ANGIOGRAM SELECTIVE EACH ADDITIONAL VESSEL  08/11/2020  . IR ANGIOGRAM SELECTIVE EACH ADDITIONAL VESSEL  08/11/2020  . IR ANGIOGRAM SELECTIVE EACH ADDITIONAL VESSEL  08/11/2020  . IR ANGIOGRAM SELECTIVE EACH ADDITIONAL VESSEL  08/26/2020  . IR ANGIOGRAM SELECTIVE EACH ADDITIONAL VESSEL  08/26/2020  . IR ANGIOGRAM VISCERAL SELECTIVE  08/11/2020  . IR ANGIOGRAM VISCERAL SELECTIVE  08/26/2020  . IR EMBO ARTERIAL NOT HEMORR HEMANG INC GUIDE ROADMAPPING  08/11/2020  . IR EMBO TUMOR ORGAN ISCHEMIA INFARCT INC GUIDE ROADMAPPING  08/26/2020  . IR IMAGING GUIDED PORT INSERTION  02/23/2020  . IR RADIOLOGIST EVAL & MGMT  07/14/2020  . IR RADIOLOGIST EVAL & MGMT  09/30/2020   . IR US GUIDE VASC ACCESS LEFT  08/26/2020  . IR US GUIDE VASC ACCESS RIGHT  08/11/2020    I have reviewed the social history and family history with the patient and they are unchanged from previous note.  ALLERGIES:  has No Known Allergies.  MEDICATIONS:  Current Outpatient Medications  Medication Sig Dispense Refill  . allopurinol (ZYLOPRIM) 100 MG tablet Take 100 mg by mouth daily.    Marland Kitchen apixaban (ELIQUIS) 5 MG TABS tablet Take 1 tablet (5 mg total) by mouth 2 (two) times daily. 180 tablet 3  . colchicine 0.6 MG tablet Take 1 tablet (0.6 mg total) by mouth daily as needed (gout flare). Take daily for 3 days, then as needed for gout flare 30 tablet 0  . diltiazem (CARDIZEM CD) 240 MG 24 hr capsule Take 1 capsule (240 mg total) by mouth daily. 90 capsule 3  . fenofibrate (TRICOR) 145 MG tablet Take 145 mg by mouth daily.    . finasteride (PROSCAR) 5 MG tablet Take 5 mg by mouth daily.    . furosemide (LASIX) 40 MG tablet Take 40 mg twice a day 90 tablet 3  . hydrALAZINE (APRESOLINE) 50 MG tablet Take 2 tablets (100 mg total) by mouth in the morning AND 1 tablet (50 mg total) daily with lunch AND 2 tablets (100 mg total) every evening. 320 tablet 1  . KLOR-CON M20 20 MEQ tablet TAKE 1 TABLET BY MOUTH TWICE A DAY 60 tablet 1  . KLOR-CON M20 20 MEQ tablet TAKE 1 TABLET BY MOUTH TWICE A DAY 180 tablet 1  . lidocaine-prilocaine (EMLA) cream Apply 1 application topically as needed. 30 g 3  . magnesium oxide (MAG-OX) 400 MG tablet Take 1 tablet (  400 mg total) by mouth daily. 30 tablet 0  . metoprolol succinate (TOPROL XL) 25 MG 24 hr tablet Take 1 tablet (25 mg total) by mouth daily. 90 tablet 3  . ondansetron (ZOFRAN) 8 MG tablet Take 1 tablet (8 mg total) by mouth 2 (two) times daily as needed. Start on the third day after chemotherapy. 30 tablet 1  . pantoprazole (PROTONIX) 40 MG tablet TAKE 1 TABLET BY MOUTH TWICE A DAY 180 tablet 0  . pravastatin (PRAVACHOL) 40 MG tablet Take 40 mg by mouth  daily.    . sucralfate (CARAFATE) 1 g tablet Take 1 tablet (1 g total) by mouth every 6 (six) hours as needed. Please schedule a yearly follow up for further refills: [phone] 60 tablet 0  . tamsulosin (FLOMAX) 0.4 MG CAPS capsule Take 0.4 mg by mouth daily.    . Vitamin D, Ergocalciferol, (DRISDOL) 1.25 MG (50000 UT) CAPS capsule Take 50,000 Units by mouth every 7 (seven) days.     No current facility-administered medications for this visit.    PHYSICAL EXAMINATION: ECOG PERFORMANCE STATUS: 0 - Asymptomatic  Vitals:   11/26/20 1153  BP: (!) 146/69  Pulse: 72  Resp: 17  Temp: (!) 97.1 F (36.2 C)  SpO2: 100%   Filed Weights   11/26/20 1153  Weight: 215 lb 8 oz (97.8 kg)    GENERAL:alert, no distress and comfortable SKIN: skin color, texture, turgor are normal, no rashes or significant lesions EYES: normal, Conjunctiva are pink and non-injected, sclera clear NECK: supple, thyroid normal size, non-tender, without nodularity LYMPH:  no palpable lymphadenopathy in the cervical, axillary  LUNGS: clear to auscultation and percussion with normal breathing effort HEART: regular rate & rhythm and no murmurs and no lower extremity edema ABDOMEN:abdomen soft, non-tender and normal bowel sounds Musculoskeletal:no cyanosis of digits and no clubbing  NEURO: alert & oriented x 3 with fluent speech, no focal motor/sensory deficits  LABORATORY DATA:  I have reviewed the data as listed CBC Latest Ref Rng & Units 11/26/2020 09/14/2020 08/26/2020  WBC 4.0 - 10.5 K/uL 6.1 4.6 6.7  Hemoglobin 13.0 - 17.0 g/dL 12.7(L) 10.9(L) 10.5(L)  Hematocrit 39.0 - 52.0 % 38.7(L) 33.5(L) 31.6(L)  Platelets 150 - 400 K/uL 223 229 211     CMP Latest Ref Rng & Units 11/26/2020 09/14/2020 09/14/2020  Glucose 70 - 99 mg/dL 118(H) 101(H) 100(H)  BUN 8 - 23 mg/dL 28(H) 22 21  Creatinine 0.61 - 1.24 mg/dL 1.96(H) 1.58(H) 1.54(H)  Sodium 135 - 145 mmol/L 136 138 140  Potassium 3.5 - 5.1 mmol/L 4.1 3.6 3.9   Chloride 98 - 111 mmol/L 103 104 105  CO2 22 - 32 mmol/L 24 24 20   Calcium 8.9 - 10.3 mg/dL 9.9 8.9 8.8  Total Protein 6.5 - 8.1 g/dL 7.4 6.6 -  Total Bilirubin 0.3 - 1.2 mg/dL 0.6 0.6 -  Alkaline Phos 38 - 126 U/L 48 42 -  AST 15 - 41 U/L 24 25 -  ALT 0 - 44 U/L 13 16 -      RADIOGRAPHIC STUDIES: I have personally reviewed the radiological images as listed and agreed with the findings in the report. No results found.   ASSESSMENT & PLAN:  Brandon Sherman is a 75 y.o. male with   1.Intrahepatic cholangiocarcinoma, cT2N0Mx,unresectable,with indeterminate lung nodules -He was diagnosed in 07/2019.CT scans and MRI liver showa large7.3cm mass in the right hepatic lobe whichabutsportal vein.  -He was seen byour local surgeon Dr. Barry Dienes andDr Carlis Abbott  at [facility] concluded that cancer is not resectable due to the invasion to portal vein. Given cancer is non-resectable his cancer is likely not curable but still treatable. -He was treated with standardfirst line chemo with IV Cisplatin and Gemcitabine 2 weeks on/1 weekoff beginning 09/29/19.Cisplatin was held since C9 (03/23/20) due to fluid status/AFib. He continued maintenance Gemcitabine until 07/19/20. He proceeded with Y90 on 08/11/20 and 08/26/20. He is currently on observation.  -He is clinically doing well. Labs reviewed, 12.7, BG 118, BUN 28, Cr 1.96. Physical exam unremarkable.  -Continue observation.  -he is scheduled for MRI and IR f/u with 12/02/2019, I will coordinate his f/u with Dr. Laurence Ferrari   2. DM, HTN, HLD, Gout, LE edema -On Metformin, amlodipine, lisinopril., allopurinol, Lasix. Continue to f/u with his PCPand cardiologist.  3.02/2020 onset AF and 04/2020 CHF -With [facility] placement on 02/23/20 he was seen to have Afib. Based on [place].He is at moderate risk for stroke. -He is now being seen by Cardiologist, on Eliquis, Lasix, Toprolol. -HeunderwentCardioversion with Dr. Meda Coffee on 04/27/20. After [facility]  late 6/2021he had another cardioversion on 05/19/20. He has recovered well.  4. Nicotine Use -He never smoked but has been chewing Tobacco for the past 60 years.He no longer drinks alcohol. -He currently chews 1-2 times a day.Iagainencouraged him toreduce and quit completely.  5.GERDand gastritis, history ofesophageal candidiasis -Hehad repeated EGDwith Dr. Havery Moros in 07/2019. 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  -Will monitor andmayrepeat EGDin future   PLAN: --he is scheduled for MRI and IR f/u with 12/02/2019, I will coordinate his f/u with Dr. Laurence Ferrari   No problem-specific Assessment & Plan notes found for this encounter.   No orders of the defined types were placed in this encounter.  All questions were answered. The patient knows to call the clinic with any problems, questions or concerns. No barriers to learning was detected. The total time spent in the appointment was 25 minutes.     [name], MD 11/26/2020   I, Joslyn Devon, am acting as scribe for [name], MD.   I have reviewed the above documentation for accuracy and completeness, and I agree with the above.

## 2020-11-26 ENCOUNTER — Inpatient Hospital Stay: Payer: Medicare HMO

## 2020-11-26 ENCOUNTER — Inpatient Hospital Stay: Payer: Medicare HMO | Attending: Hematology | Admitting: Hematology

## 2020-11-26 ENCOUNTER — Other Ambulatory Visit: Payer: Self-pay

## 2020-11-26 VITALS — BP 146/69 | HR 72 | Temp 97.1°F | Resp 17 | Ht 69.0 in | Wt 215.5 lb

## 2020-11-26 DIAGNOSIS — Z7984 Long term (current) use of oral hypoglycemic drugs: Secondary | ICD-10-CM | POA: Diagnosis not present

## 2020-11-26 DIAGNOSIS — I110 Hypertensive heart disease with heart failure: Secondary | ICD-10-CM | POA: Insufficient documentation

## 2020-11-26 DIAGNOSIS — R918 Other nonspecific abnormal finding of lung field: Secondary | ICD-10-CM | POA: Diagnosis not present

## 2020-11-26 DIAGNOSIS — Z95828 Presence of other vascular implants and grafts: Secondary | ICD-10-CM

## 2020-11-26 DIAGNOSIS — I509 Heart failure, unspecified: Secondary | ICD-10-CM | POA: Diagnosis not present

## 2020-11-26 DIAGNOSIS — E119 Type 2 diabetes mellitus without complications: Secondary | ICD-10-CM | POA: Diagnosis not present

## 2020-11-26 DIAGNOSIS — C221 Intrahepatic bile duct carcinoma: Secondary | ICD-10-CM

## 2020-11-26 DIAGNOSIS — I4891 Unspecified atrial fibrillation: Secondary | ICD-10-CM | POA: Insufficient documentation

## 2020-11-26 DIAGNOSIS — Z7901 Long term (current) use of anticoagulants: Secondary | ICD-10-CM | POA: Diagnosis not present

## 2020-11-26 DIAGNOSIS — F17220 Nicotine dependence, chewing tobacco, uncomplicated: Secondary | ICD-10-CM | POA: Diagnosis not present

## 2020-11-26 LAB — CBC WITH DIFFERENTIAL (CANCER CENTER ONLY)
Abs Immature Granulocytes: 0.02 10*3/uL (ref 0.00–0.07)
Basophils Absolute: 0.1 10*3/uL (ref 0.0–0.1)
Basophils Relative: 1 %
Eosinophils Absolute: 0.1 10*3/uL (ref 0.0–0.5)
Eosinophils Relative: 2 %
HCT: 38.7 % — ABNORMAL LOW (ref 39.0–52.0)
Hemoglobin: 12.7 g/dL — ABNORMAL LOW (ref 13.0–17.0)
Immature Granulocytes: 0 %
Lymphocytes Relative: 13 %
Lymphs Abs: 0.8 10*3/uL (ref 0.7–4.0)
MCH: 31 pg (ref 26.0–34.0)
MCHC: 32.8 g/dL (ref 30.0–36.0)
MCV: 94.4 fL (ref 80.0–100.0)
Monocytes Absolute: 0.6 10*3/uL (ref 0.1–1.0)
Monocytes Relative: 10 %
Neutro Abs: 4.5 10*3/uL (ref 1.7–7.7)
Neutrophils Relative %: 74 %
Platelet Count: 223 10*3/uL (ref 150–400)
RBC: 4.1 MIL/uL — ABNORMAL LOW (ref 4.22–5.81)
RDW: 14 % (ref 11.5–15.5)
WBC Count: 6.1 10*3/uL (ref 4.0–10.5)
nRBC: 0 % (ref 0.0–0.2)

## 2020-11-26 LAB — CMP (CANCER CENTER ONLY)
ALT: 13 U/L (ref 0–44)
AST: 24 U/L (ref 15–41)
Albumin: 3.9 g/dL (ref 3.5–5.0)
Alkaline Phosphatase: 48 U/L (ref 38–126)
Anion gap: 9 (ref 5–15)
BUN: 28 mg/dL — ABNORMAL HIGH (ref 8–23)
CO2: 24 mmol/L (ref 22–32)
Calcium: 9.9 mg/dL (ref 8.9–10.3)
Chloride: 103 mmol/L (ref 98–111)
Creatinine: 1.96 mg/dL — ABNORMAL HIGH (ref 0.61–1.24)
GFR, Estimated: 36 mL/min — ABNORMAL LOW (ref >60)
Glucose, Bld: 118 mg/dL — ABNORMAL HIGH (ref 70–99)
Potassium: 4.1 mmol/L (ref 3.5–5.1)
Sodium: 136 mmol/L (ref 135–145)
Total Bilirubin: 0.6 mg/dL (ref 0.3–1.2)
Total Protein: 7.4 g/dL (ref 6.5–8.1)

## 2020-11-26 MED ORDER — HEPARIN SOD (PORK) LOCK FLUSH 100 UNIT/ML IV SOLN
500.0000 [IU] | Freq: Once | INTRAVENOUS | Status: AC
  Administered 2020-11-26: 500 [IU]
  Filled 2020-11-26: qty 5

## 2020-11-26 MED ORDER — SODIUM CHLORIDE 0.9% FLUSH
10.0000 mL | Freq: Once | INTRAVENOUS | Status: AC
Start: 2020-11-26 — End: 2020-11-26
  Administered 2020-11-26: 10 mL
  Filled 2020-11-26: qty 10

## 2020-11-27 ENCOUNTER — Encounter: Payer: Self-pay | Admitting: Hematology

## 2020-11-30 ENCOUNTER — Ambulatory Visit (HOSPITAL_COMMUNITY)
Admission: RE | Admit: 2020-11-30 | Discharge: 2020-11-30 | Disposition: A | Discharge status: Home / Self Care | Payer: Medicare HMO | Source: Ambulatory Visit | Attending: Interventional Radiology | Admitting: Interventional Radiology

## 2020-11-30 ENCOUNTER — Telehealth: Payer: Self-pay | Admitting: Hematology

## 2020-11-30 ENCOUNTER — Other Ambulatory Visit: Payer: Self-pay

## 2020-11-30 DIAGNOSIS — C221 Intrahepatic bile duct carcinoma: Secondary | ICD-10-CM | POA: Diagnosis not present

## 2020-11-30 DIAGNOSIS — K7689 Other specified diseases of liver: Secondary | ICD-10-CM | POA: Diagnosis not present

## 2020-11-30 DIAGNOSIS — K838 Other specified diseases of biliary tract: Secondary | ICD-10-CM | POA: Diagnosis not present

## 2020-11-30 DIAGNOSIS — Z8505 Personal history of malignant neoplasm of liver: Secondary | ICD-10-CM | POA: Diagnosis not present

## 2020-11-30 DIAGNOSIS — I81 Portal vein thrombosis: Secondary | ICD-10-CM | POA: Diagnosis not present

## 2020-11-30 IMAGING — MR MR ABDOMEN WO/W CM
18 series · 46 of 48 positions shown · IV contrast (gadavist)
Comparison: Abdominal MRI [DATE].

CLINICAL DATA: 74-year-old male with history of intrahepatic
cholangiocarcinoma. Follow-up study.

EXAM:
MRI ABDOMEN WITHOUT AND WITH CONTRAST
TECHNIQUE: Multiplanar multisequence MR imaging of the abdomen was performed
both before and after the administration of intravenous contrast.
CONTRAST:  10mL GADAVIST GADOBUTROL 1 MMOL/ML IV SOLN

[Series 3: T2 · coronal · 6.0mm · 1.56mm/px · 2 of 38 slices shown (1 of 2)]
[im 1/38]
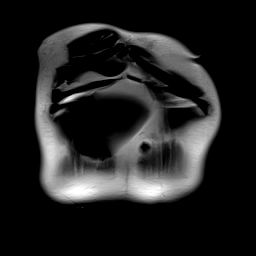
[im 38/38]
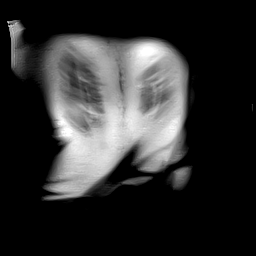

[Series 5: T2 fat-sat · axial · 6.0mm · 1.25mm/px · z∈[-146,+142]mm · 2 of 41 slices shown]
[im 1/41]
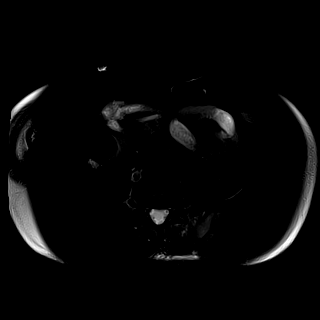
[im 41/41]
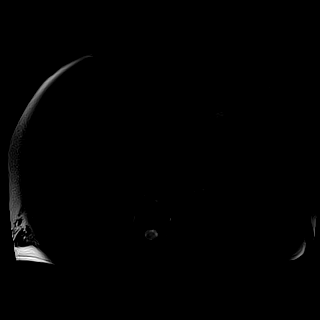

[Series 6: T1 · axial · 3.0mm · 1.25mm/px · z∈[-132,+129]mm · 4 of 88 slices shown (1 of 2)]
[im 1/88]
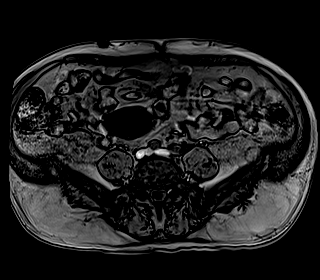
[im 30/88]
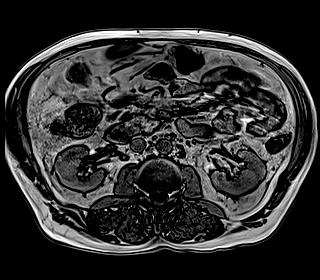
[im 59/88]
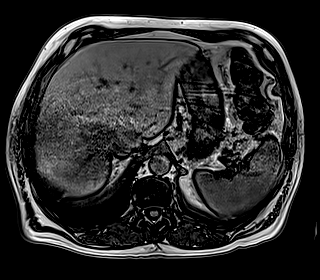
[im 88/88]
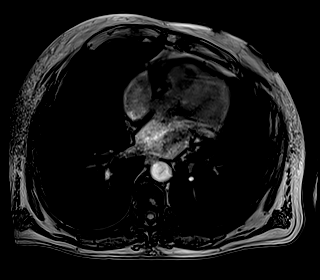

[Series 7: T1 · axial · 3.0mm · 1.25mm/px · z∈[-132,+129]mm · 3 of 88 slices shown (2 of 2)]
[im 1/88]
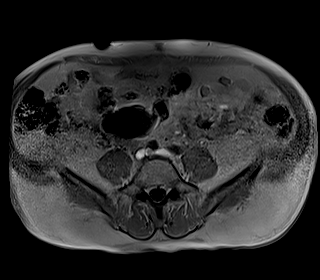
[im 44/88]
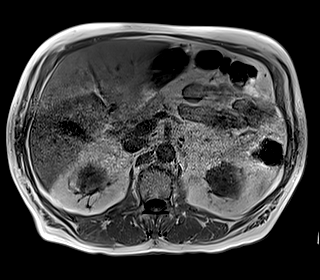
[im 88/88]
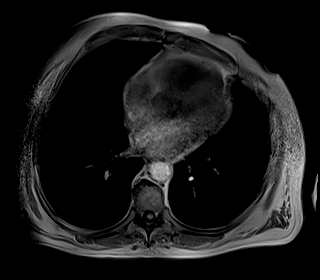

[Series 8: DWI · axial · 6.0mm · 1.49mm/px · z∈[-135,+146]mm · 3 of 80 slices shown (1 of 2)]
[im 1/80]
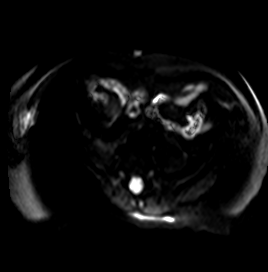
[im 40/80]
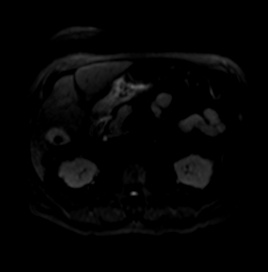
[im 80/80]
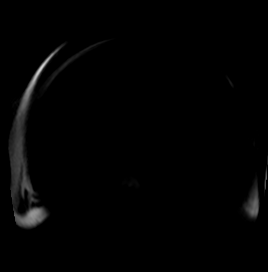

[Series 9: DWI · axial · 6.0mm · 1.49mm/px · 1 of 40 slices shown (2 of 2)]
[im 1/40]
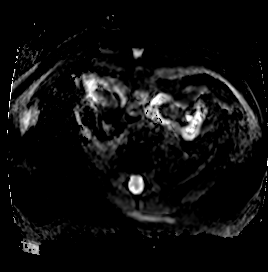

[Series 10: bSSFP · axial · 4.0mm · 0.84mm/px · z∈[-134,+126]mm · 2 of 66 slices shown]
[im 1/66]
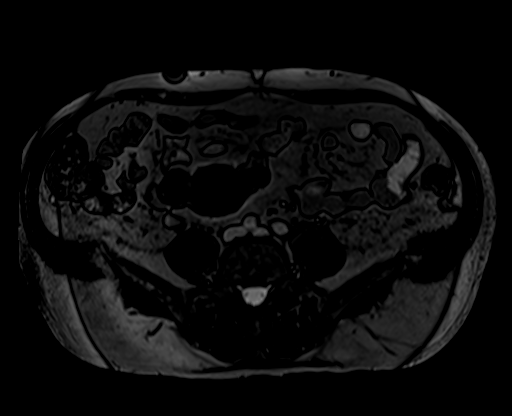
[im 66/66]
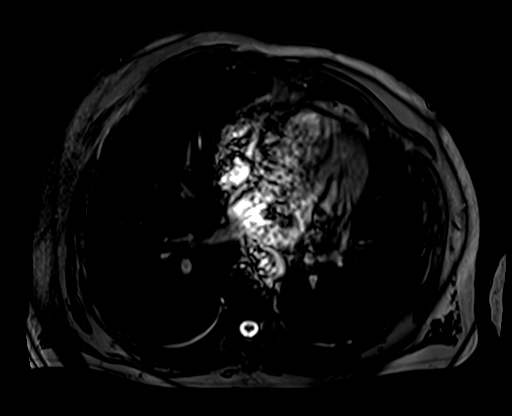

[Series 12: T1 dynamic · axial · 3.0mm · 1.25mm/px · z∈[-132,+129]mm · 3 of 88 slices shown (1 of 6)]
[im 1/88]
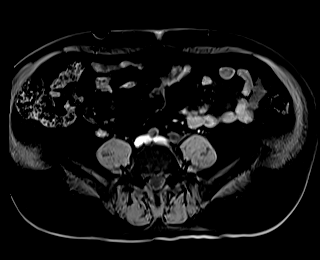
[im 44/88]
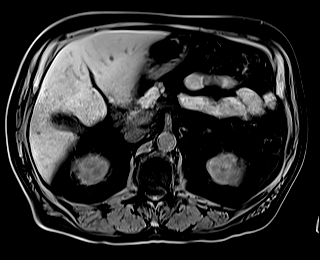
[im 88/88]
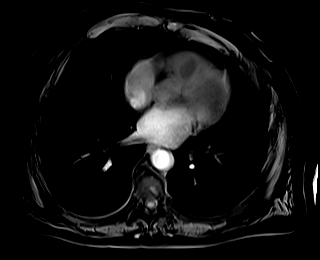

[Series 15: T1 dynamic · axial · 3.0mm · 1.25mm/px · z∈[-132,+129]mm · 3 of 88 slices shown (2 of 6)]
[im 1/88]
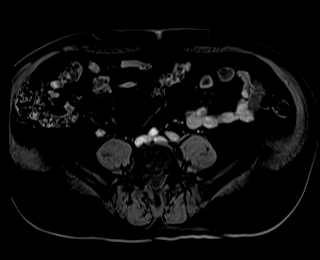
[im 44/88]
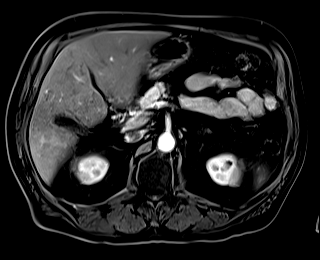
[im 88/88]
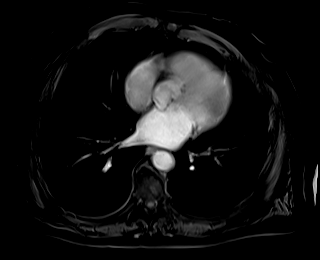

[Series 17: T1 dynamic · axial · 3.0mm · 1.25mm/px · z∈[-132,+129]mm · 3 of 88 slices shown (3 of 6)]
[im 1/88]
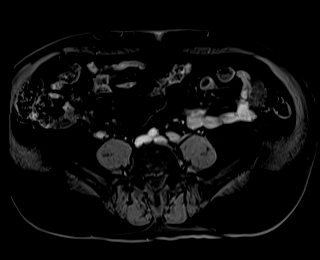
[im 44/88]
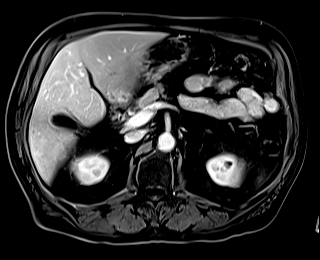
[im 88/88]
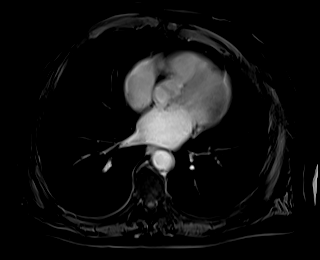

[Series 19: T1 dynamic · axial · 3.0mm · 1.25mm/px · z∈[-132,+129]mm · 3 of 88 slices shown (4 of 6)]
[im 1/88]
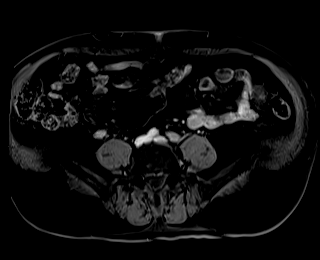
[im 44/88]
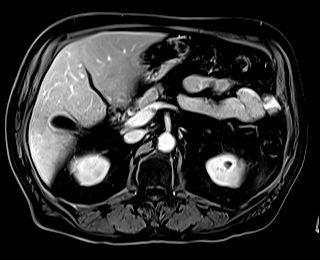
[im 88/88]
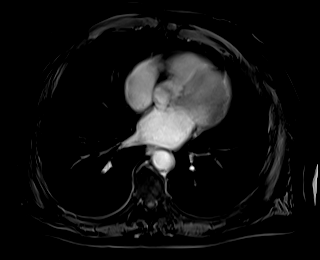

[Series 21: T1 dynamic · coronal · 5.0mm · 1.41mm/px · 2 of 60 slices shown (5 of 6)]
[im 1/60]
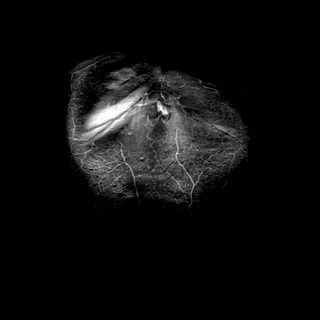
[im 60/60]
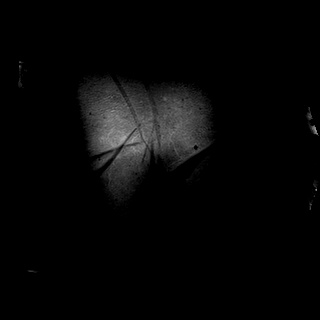

[Series 22: T2 · axial · 6.0mm · 1.76mm/px · z∈[-153,+142]mm · 2 of 42 slices shown (2 of 2)]
[im 1/42]
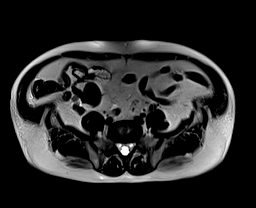
[im 42/42]
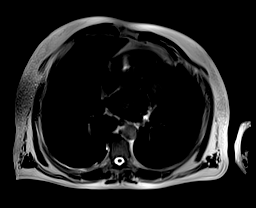

[Series 24: T1 dynamic · axial · 3.0mm · 1.25mm/px · z∈[-132,+129]mm · 3 of 88 slices shown (6 of 6)]
[im 1/88]
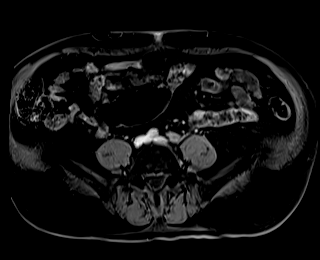
[im 44/88]
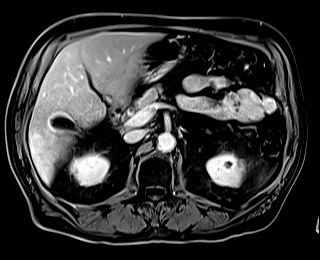
[im 88/88]
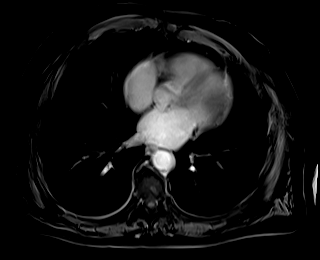

[Series 100: sub 20 sec · axial · 3.0mm · 1.25mm/px · z∈[-132,+129]mm · 3 of 88 slices shown]
[im 1/88]
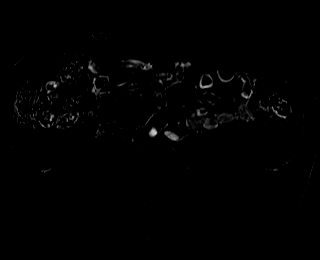
[im 44/88]
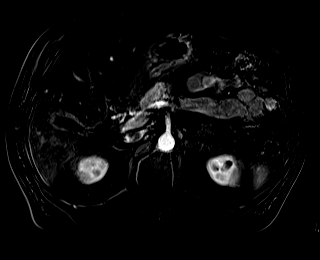
[im 88/88]
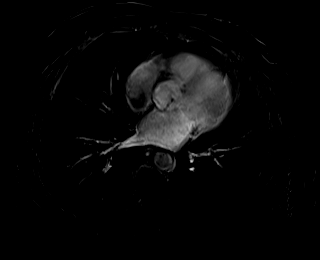

[Series 101: sub 45 sec · axial · 3.0mm · 1.25mm/px · z∈[-132,+129]mm · 3 of 88 slices shown]
[im 1/88]
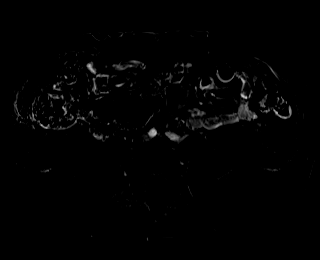
[im 44/88]
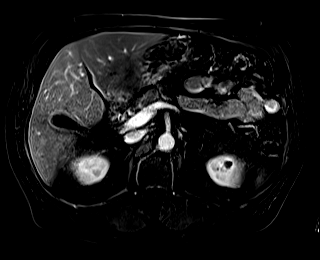
[im 88/88]
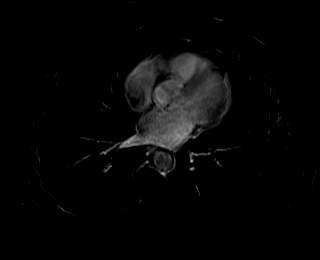

[Series 102: sub 90 sec · axial · 3.0mm · 1.25mm/px · z∈[-132,+129]mm · 3 of 88 slices shown]
[im 1/88]
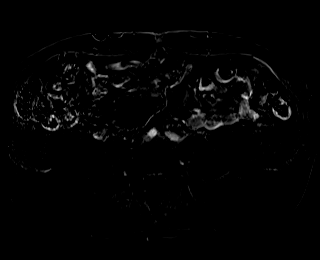
[im 44/88]
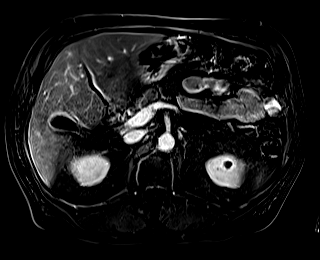
[im 88/88]
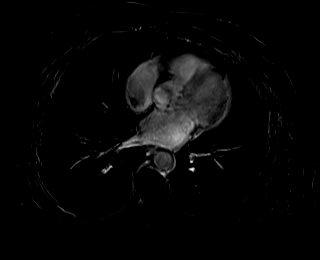

[Series 103: sub 3 min · axial · 3.0mm · 1.25mm/px · 1 of 88 slices shown]
[im 1/88]
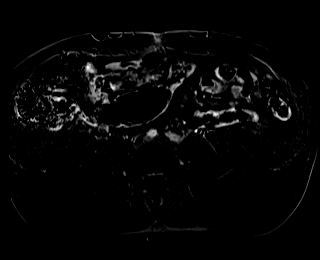

[46 of 48 positions shown; findings below may reference images not displayed]

FINDINGS: Lower chest: Unremarkable.

Hepatobiliary: In the central aspect of the right lobe of the liver
there is again a subtle T1 hypointense, mildly T2 hyperintense
lesion which demonstrates a small amount of diffusion restriction,
and demonstrates a heterogeneous pattern of enhancement,
predominantly hypovascular on initial postcontrast images with
progressive delayed enhancement, compatible with reported clinical
history of intrahepatic cholangiocarcinoma. This currently occupies
the central aspect of segments 7 and 8 (axial image 25 of series 15
and coronal image 35 of series 21) measuring 4.6 x 4.6 x 2.3 cm,
with some extension into the inferior vena cava concerning for tumor
thrombus. This is associated with increasing intrahepatic biliary
ductal dilatation predominantly in segment 7. Common bile duct
measures 6 mm in the porta hepatis. No filling defect in the common
bile duct to suggest choledocholithiasis. However, there is
increased enhancement in the distal common bile duct at and
immediately proximal to the level of the ampulla. Small filling
defect lying dependently in the gallbladder measuring 1.1 cm in
diameter. Gallbladder is not distended. No gallbladder wall
thickening or pericholecystic fluid or inflammatory changes to
suggest an acute cholecystitis at this time.

Pancreas: Subtle predominantly delayed enhancement in the pancreatic
head surrounding the ampullary portion of the distal common bile
duct (axial image 57 of series 24) measuring 2.0 x 1.2 cm on today's
examination. No pancreatic ductal dilatation. No pancreatic or
peripancreatic fluid collections or inflammatory changes.

Spleen:  Unremarkable.

Adrenals/Urinary Tract: Subcentimeter T1 hypointense, T2
hyperintense, nonenhancing lesions are noted in both kidneys,
compatible with tiny simple cysts. No hydroureteronephrosis in the
visualized portions of the abdomen. Bilateral adrenal glands are
normal in appearance.

Stomach/Bowel: Visualized portions are unremarkable.

Vascular/Lymphatic: Aortic atherosclerosis. No aneurysm identified
in the visualized abdominal vasculature. No lymphadenopathy noted in
the abdomen.

Other: No significant volume of ascites noted in the visualized
portions of the peritoneal cavity.

Musculoskeletal: No aggressive appearing osseous lesions are noted
in the visualized portions of the skeleton.
IMPRESSION: 1. Today's study demonstrates progression of disease with enlarging
central lesion in the right lobe of the liver involving portions of
segments 7 and 8, now with evidence of some tumor thrombus extension
into the intrahepatic portion of the inferior vena cava. This is
associated with increasing intrahepatic biliary ductal dilatation in
segment 7 of the liver.
2. In addition, there is some subtle hyperenhancement of the distal
common bile duct at and immediately proximally to the level of the
ampulla. There is also some subtle delayed enhancement around this
region in the pancreatic head. This is of uncertain etiology and
significance, but may be inflammatory and currently is not
associated with proximal common bile duct dilatation. However, close
attention on follow-up imaging is recommended.
3. Cholelithiasis without evidence of acute cholecystitis at this
time.
4. Aortic atherosclerosis.

## 2020-11-30 MED ORDER — GADOBUTROL 1 MMOL/ML IV SOLN
10.0000 mL | Freq: Once | INTRAVENOUS | Status: AC | PRN
  Administered 2020-11-30: 10 mL via INTRAVENOUS

## 2020-11-30 NOTE — Telephone Encounter (Signed)
Scheduled appt per 1/10 sch msg - pt is aware of appt date and time   

## 2020-11-30 NOTE — Telephone Encounter (Signed)
No 1/7 los. No changes made to pt's schedule.

## 2020-12-01 ENCOUNTER — Ambulatory Visit
Admission: RE | Admit: 2020-12-01 | Discharge: 2020-12-01 | Disposition: A | Discharge status: Home / Self Care | Payer: Medicare HMO | Source: Ambulatory Visit | Attending: Interventional Radiology | Admitting: Interventional Radiology

## 2020-12-01 DIAGNOSIS — C221 Intrahepatic bile duct carcinoma: Secondary | ICD-10-CM

## 2020-12-01 DIAGNOSIS — Z9889 Other specified postprocedural states: Secondary | ICD-10-CM | POA: Diagnosis not present

## 2020-12-01 HISTORY — PX: IR RADIOLOGIST EVAL & MGMT: IMG5224

## 2020-12-01 NOTE — Progress Notes (Signed)
  Chief Complaint: Patient was consulted remotely today (TeleHealth) for intrahepatic cholangiocarcinoma at the request of McCullough,Heath K.    Referring Physician(s): Dr. Yan Feng  History of Present Illness: Brandon Sherman is a 75 y.o. 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 He underwent biopsy in September 2020 which confirmed adenocarcinoma consistent with cholangiocarcinoma. Based on lesion size, his clinical stage was cT2, cN0, cM0 stage II. He began cisplatin and gemcitabine therapy in November 2020. Cisplatin was subsequently held beginning in May 2021 due to issues with atrial fibrillation and congestive heart failure. He is now on maintenance gemcitabine. His last dose was 07/12/2020.  He underwent a right lobar transarterial radio embolization with Y 90 on 08/26/2020.  His postoperative course was uncomplicated with a speedy recovery.  He recently underwent his first surveillance MRI on 11/30/2020 to evaluate his response to therapy.  We met today via teleconference to discuss the MRI findings and subsequent plan.   Unfortunately, the MRI demonstrates progression of disease with enlarging central lesion in the right lobe of the liver involving portions of segments 7 and 8, now with evidence of some tumor thrombus extension into the intrahepatic portion of the inferior vena cava. This is associated with increasing intrahepatic biliary ductal dilatation in segment 7 of the liver.  Clinically, Mr. Word feels well.  His appetite is good, he denies abdominal pain, nausea, vomiting or unintended weight loss.  Understandably, he was disappointed to hear the news that his tumor is progressing.   Past Medical History:  Diagnosis Date  . Arthritis   . Diabetes (HCC)   . GERD (gastroesophageal reflux disease)   . Hyperlipidemia   . Hypertension   . Intrahepatic cholangiocarcinoma (HCC)     Past Surgical History:  Procedure  Laterality Date  . APPENDECTOMY  1980  . CARDIOVERSION N/A 04/27/2020   Procedure: CARDIOVERSION;  Surgeon: Nelson, Katarina H, MD;  Location: [facility];  Service: Cardiovascular;  Laterality: N/A;  . CARDIOVERSION N/A 05/19/2020   Procedure: CARDIOVERSION;  Surgeon: Acharya, Gayatri A, MD;  Location: [facility];  Service: Cardiovascular;  Laterality: N/A;  . COLONOSCOPY    . IR 3D INDEPENDENT WKST  08/11/2020  . IR 3D INDEPENDENT WKST  08/11/2020  . IR ANGIOGRAM SELECTIVE EACH ADDITIONAL VESSEL  08/11/2020  . IR ANGIOGRAM SELECTIVE EACH ADDITIONAL VESSEL  08/11/2020  . IR ANGIOGRAM SELECTIVE EACH ADDITIONAL VESSEL  08/11/2020  . IR ANGIOGRAM SELECTIVE EACH ADDITIONAL VESSEL  08/11/2020  . IR ANGIOGRAM SELECTIVE EACH ADDITIONAL VESSEL  08/11/2020  . IR ANGIOGRAM SELECTIVE EACH ADDITIONAL VESSEL  08/11/2020  . IR ANGIOGRAM SELECTIVE EACH ADDITIONAL VESSEL  08/26/2020  . IR ANGIOGRAM SELECTIVE EACH ADDITIONAL VESSEL  08/26/2020  . IR ANGIOGRAM VISCERAL SELECTIVE  08/11/2020  . IR ANGIOGRAM VISCERAL SELECTIVE  08/26/2020  . IR EMBO ARTERIAL NOT HEMORR HEMANG INC GUIDE ROADMAPPING  08/11/2020  . IR EMBO TUMOR ORGAN ISCHEMIA INFARCT INC GUIDE ROADMAPPING  08/26/2020  . IR IMAGING GUIDED PORT INSERTION  02/23/2020  . IR RADIOLOGIST EVAL & MGMT  07/14/2020  . IR RADIOLOGIST EVAL & MGMT  09/30/2020  . IR US GUIDE VASC ACCESS LEFT  08/26/2020  . IR US GUIDE VASC ACCESS RIGHT  08/11/2020    Allergies: Patient has no known allergies.  Medications: Prior to Admission medications   Medication Sig Start Date End Date Taking? Authorizing Provider  allopurinol (ZYLOPRIM) 100 MG tablet Take 100 mg by mouth daily.    [provider]    apixaban (ELIQUIS) 5 MG TABS tablet Take 1 tablet (5 mg total) by mouth 2 (two) times daily. 03/24/20   Lorretta Harp, MD  colchicine 0.6 MG tablet Take 1 tablet (0.6 mg total) by mouth daily as needed (gout flare). Take daily for 3 days, then as needed for gout flare 05/22/20  09/20/21  Rai, Vernelle Emerald, MD  diltiazem (CARDIZEM CD) 240 MG 24 hr capsule Take 1 capsule (240 mg total) by mouth daily. 08/24/20 11/22/20  Lorretta Harp, MD  fenofibrate (TRICOR) 145 MG tablet Take 145 mg by mouth daily.    [provider]  finasteride (PROSCAR) 5 MG tablet Take 5 mg by mouth daily.    [provider]  furosemide (LASIX) 40 MG tablet Take 40 mg twice a day 09/06/20   Lorretta Harp, MD  hydrALAZINE (APRESOLINE) 50 MG tablet Take 2 tablets (100 mg total) by mouth in the morning AND 1 tablet (50 mg total) daily with lunch AND 2 tablets (100 mg total) every evening. 09/20/20   Lorretta Harp, MD  KLOR-CON M20 20 MEQ tablet TAKE 1 TABLET BY MOUTH TWICE A DAY 09/21/20   Alla Feeling, NP  KLOR-CON M20 20 MEQ tablet TAKE 1 TABLET BY MOUTH TWICE A DAY 11/15/20   Lorretta Harp, MD  lidocaine-prilocaine (EMLA) cream Apply 1 application topically as needed. 03/29/20   Alla Feeling, NP  magnesium oxide (MAG-OX) 400 MG tablet Take 1 tablet (400 mg total) by mouth daily. 05/31/20   [name], MD  metoprolol succinate (TOPROL XL) 25 MG 24 hr tablet Take 1 tablet (25 mg total) by mouth daily. 06/23/20   Erlene Quan, PA-C  ondansetron (ZOFRAN) 8 MG tablet Take 1 tablet (8 mg total) by mouth 2 (two) times daily as needed. Start on the third day after chemotherapy. 05/03/20   [name], MD  pantoprazole (PROTONIX) 40 MG tablet TAKE 1 TABLET BY MOUTH TWICE A DAY 08/31/20   [name], MD  pravastatin (PRAVACHOL) 40 MG tablet Take 40 mg by mouth daily.    [provider]  sucralfate (CARAFATE) 1 g tablet Take 1 tablet (1 g total) by mouth every 6 (six) hours as needed. Please schedule a yearly follow up for further refills: [phone] 06/15/20   Yetta Flock, MD  tamsulosin (FLOMAX) 0.4 MG CAPS capsule Take 0.4 mg by mouth daily.    [provider]  Vitamin D, Ergocalciferol, (DRISDOL) 1.25 MG (50000 UT) CAPS capsule Take 50,000 Units by mouth  every 7 (seven) days.    [provider]     Family History  Problem Relation Age of Onset  . Heart attack Mother   . Stroke Father   . Colon cancer Neg Hx   . Esophageal cancer Neg Hx   . Stomach cancer Neg Hx   . Rectal cancer Neg Hx   . Colon polyps Neg Hx     Social History   Socioeconomic History  . Marital status: Married    Spouse name: Not on file  . Number of children: 3  . Years of education: Not on file  . Highest education level: Not on file  Occupational History  . Not on file  Tobacco Use  . Smoking status: Never Smoker  . Smokeless tobacco: Current User    Types: Chew  . Tobacco comment: for 60 years, 1-2 daily   Vaping Use  . Vaping Use: Never used  Substance and Sexual Activity  .  Alcohol use: Not Currently    Comment: occasionally  . Drug use: Never  . Sexual activity: Not on file  Other Topics Concern  . Not on file  Social History Narrative  . Not on file   Social Determinants of Health   Financial Resource Strain: Not on file  Food Insecurity: Not on file  Transportation Needs: No Transportation Needs  . Lack of Transportation (Medical): No  . Lack of Transportation (Non-Medical): No  Physical Activity: Insufficiently Active  . Days of Exercise per Week: 1 day  . Minutes of Exercise per Session: 50 min  Stress: Not on file  Social Connections: Unknown  . Frequency of Communication with Friends and Family: More than three times a week  . Frequency of Social Gatherings with Friends and Family: More than three times a week  . Attends Religious Services: Not on file  . Active Member of Clubs or Organizations: Not on file  . Attends Club or Organization Meetings: Not on file  . Marital Status: Married    ECOG Status: 0 - Asymptomatic  Review of Systems  Review of Systems: A 12 point ROS discussed and pertinent positives are indicated in the HPI above.  All other systems are negative.  Physical Exam No direct physical exam  was performed (except for noted visual exam findings with Video Visits).   Vital Signs: There were no vitals taken for this visit.  Imaging: MR ABDOMEN WWO CONTRAST  Result Date: 11/30/2020 CLINICAL DATA:  74-year-old male with history of intrahepatic cholangiocarcinoma. Follow-up study. EXAM: MRI ABDOMEN WITHOUT AND WITH CONTRAST TECHNIQUE: Multiplanar multisequence MR imaging of the abdomen was performed both before and after the administration of intravenous contrast. CONTRAST:  10mL GADAVIST GADOBUTROL 1 MMOL/ML IV SOLN COMPARISON:  Abdominal MRI 07/06/2020. FINDINGS: Lower chest: Unremarkable. Hepatobiliary: In the central aspect of the right lobe of the liver there is again a subtle T1 hypointense, mildly T2 hyperintense lesion which demonstrates a small amount of diffusion restriction, and demonstrates a heterogeneous pattern of enhancement, predominantly hypovascular on initial postcontrast images with progressive delayed enhancement, compatible with reported clinical history of intrahepatic cholangiocarcinoma. This currently occupies the central aspect of segments 7 and 8 (axial image 25 of series 15 and coronal image 35 of series 21) measuring 4.6 x 4.6 x 2.3 cm, with some extension into the inferior vena cava concerning for tumor thrombus. This is associated with increasing intrahepatic biliary ductal dilatation predominantly in segment 7. Common bile duct measures 6 mm in the porta hepatis. No filling defect in the common bile duct to suggest choledocholithiasis. However, there is increased enhancement in the distal common bile duct at and immediately proximal to the level of the ampulla. Small filling defect lying dependently in the gallbladder measuring 1.1 cm in diameter. Gallbladder is not distended. No gallbladder wall thickening or pericholecystic fluid or inflammatory changes to suggest an acute cholecystitis at this time. Pancreas: Subtle predominantly delayed enhancement in the  pancreatic head surrounding the ampullary portion of the distal common bile duct (axial image 57 of series 24) measuring 2.0 x 1.2 cm on today's examination. No pancreatic ductal dilatation. No pancreatic or peripancreatic fluid collections or inflammatory changes. Spleen:  Unremarkable. Adrenals/Urinary Tract: Subcentimeter T1 hypointense, T2 hyperintense, nonenhancing lesions are noted in both kidneys, compatible with tiny simple cysts. No hydroureteronephrosis in the visualized portions of the abdomen. Bilateral adrenal glands are normal in appearance. Stomach/Bowel: Visualized portions are unremarkable. Vascular/Lymphatic: Aortic atherosclerosis. No aneurysm identified in the visualized abdominal vasculature. No   lymphadenopathy noted in the abdomen. Other: No significant volume of ascites noted in the visualized portions of the peritoneal cavity. Musculoskeletal: No aggressive appearing osseous lesions are noted in the visualized portions of the skeleton. IMPRESSION: 1. Today's study demonstrates progression of disease with enlarging central lesion in the right lobe of the liver involving portions of segments 7 and 8, now with evidence of some tumor thrombus extension into the intrahepatic portion of the inferior vena cava. This is associated with increasing intrahepatic biliary ductal dilatation in segment 7 of the liver. 2. In addition, there is some subtle hyperenhancement of the distal common bile duct at and immediately proximally to the level of the ampulla. There is also some subtle delayed enhancement around this region in the pancreatic head. This is of uncertain etiology and significance, but may be inflammatory and currently is not associated with proximal common bile duct dilatation. However, close attention on follow-up imaging is recommended. 3. Cholelithiasis without evidence of acute cholecystitis at this time. 4. Aortic atherosclerosis. Electronically Signed   By: Vinnie Langton M.D.   On:  11/30/2020 12:05    Labs:  CBC: Recent Labs    08/11/20 0814 08/26/20 0820 09/14/20 1300 11/26/20 1128  WBC 6.0 6.7 4.6 6.1  HGB 10.0* 10.5* 10.9* 12.7*  HCT 30.8* 31.6* 33.5* 38.7*  PLT 267 211 229 223    COAGS: Recent Labs    02/23/20 1026 08/11/20 0814 08/26/20 0820 09/14/20 1300  INR 1.1 1.1 1.2 1.2    BMP: Recent Labs    07/19/20 0830 08/11/20 0814 08/26/20 0820 09/02/20 1208 09/14/20 1212 09/14/20 1300 11/26/20 1128  NA 139 138   < > 136 140 138 136  K 3.3* 4.1   < > 5.3* 3.9 3.6 4.1  CL 107 107   < > 96 105 104 103  CO2 22 23   < > _0 GLUCOSE 102* 98   < > 81 100* 101* 118*  BUN 23 30*   < > 34* 21 22 28*  CALCIUM 9.4 8.3*   < > 9.8 8.8 8.9 9.9  CREATININE 1.52* 1.80*   < > 2.04* 1.54* 1.58* 1.96*  GFRNONAA 45* 37*   < > 32* 44* 46* 36*  GFRAA 52* 43*  --  37* 51*  --   --    < > = values in this interval not displayed.    LIVER FUNCTION TESTS: Recent Labs    08/11/20 0814 08/26/20 0820 09/14/20 1300 11/26/20 1128  BILITOT 0.6 0.9 0.6 0.6  AST _1 ALT _2 ALKPHOS 31* 32* 42 48  PROT 6.2* 6.8 6.6 7.4  ALBUMIN 3.7 3.6 3.5 3.9    TUMOR MARKERS: No results for input(s): AFPTM, CEA, CA199, CHROMGRNA in the last 8760 hours.  Assessment and Plan:  Unfortunately, there has been progression of disease at 3 months status post Y 90 radioembolization.  We have already maximized the amount of intrahepatic radiation to the tumor and repeat radioembolization is not possible.    I will reach out to Dr. Burr Medico to inquire about additional systemic therapy.  I explained the situation to Mr. Abdou.    1.) Consider additional systemic therapy.  2.) Further liver directed therapy unlikely to yield further benefit.     Electronically Signed: Criselda Peaches 12/01/2020, 12:53 PM   I spent a total of 15 Minutes in remote  clinical consultation, greater than 50% of which was counseling/coordinating  care for intrahepatic  cholangiocarcinoma.    Visit type: Audio only (telephone). Audio (no video) only due to patient preference. Alternative for in-person consultation at [facility], [address] [place], [place], [place]. This visit type was conducted due to national recommendations for restrictions regarding the COVID-19 Pandemic (e.g. social distancing).  This format is felt to be most appropriate for this patient at this time.  All issues noted in this document were discussed and addressed.

## 2020-12-13 ENCOUNTER — Inpatient Hospital Stay (HOSPITAL_BASED_OUTPATIENT_CLINIC_OR_DEPARTMENT_OTHER): Payer: Medicare HMO | Admitting: Hematology

## 2020-12-13 DIAGNOSIS — C221 Intrahepatic bile duct carcinoma: Secondary | ICD-10-CM | POA: Diagnosis not present

## 2020-12-13 NOTE — Progress Notes (Signed)
[facility]   [phone] [phone]   Clinic Follow up Note   Patient Care Team: [name], PA as PCP - General (Family Medicine) Lorretta Harp, MD as PCP - Cardiology (Cardiology) [name], MD as Consulting Physician (General Surgery) [name], [name], MD as Consulting Physician (Gastroenterology) [name], MD as Consulting Physician (Hematology) Lorretta Harp, MD as Consulting Physician (Cardiology)   I connected with Conan Bowens on 12/13/2020 at  4:05 PM EST by telephone visit and verified that I am speaking with the correct person using two identifiers.  I discussed the limitations, risks, security and privacy concerns of performing an evaluation and management service by telephone and the availability of in person appointments. I also discussed with the patient that there may be a patient responsible charge related to this service. The patient expressed understanding and agreed to proceed.   Other persons participating in the visit and their role in the encounter:  His wife   Patient's location:  His Home  Provider's location:  My Office   CHIEF COMPLAINT: F/u cholangiocarcinoma  SUMMARY OF ONCOLOGIC HISTORY: Oncology History Overview Note  Cancer Staging Intrahepatic cholangiocarcinoma ([place]) Staging form: Intrahepatic Bile Duct, AJCC 8th Edition - Clinical stage from 08/19/2019: Stage II (cT2, cN0, cM0) - Signed by [name], MD on 08/19/2019    Intrahepatic cholangiocarcinoma ([place])  06/28/2019 Imaging   CT AP W Contrast 06/28/19  IMPRESSION: 1. Heterogeneous hypodensity posteriorly in the right hepatic lobe and potentially extending into the caudate lobe suspicious for a mass. There is felt to be truncation of branches of the portal vein in this vicinity and some narrowing of the hepatic vein, as well as triangular-shaped regions of abnormal hypoenhancement posteriorly in the right hepatic lobe likely  representing downstream vascular effects. Cannot exclude malignancy such as cholangiocarcinoma or hepatocellular carcinoma, and follow up hepatic protocol MRI with and without contrast is recommended to further characterize. 2. 4 mm right middle lobe pulmonary nodule is likely benign but may merit surveillance. 3. Cholelithiasis. 4.  Aortic Atherosclerosis (ICD10-I70.0). 5. Prostatomegaly. 6. Mild impingement at L3-4 and L4-5.   07/31/2019 Imaging   MRI Liver 07/31/19 IMPRESSION: 1. 7.3 cm in long axis mass in the right hepatic lobe spanning into the caudate lobe, high suspicion for malignancy such as hepatocellular carcinoma or cholangiocarcinoma. Suspected effacement or occlusion of the right hepatic vein and posterior branches of the right portal vein. Two smaller tumor nodules along the posterior periphery of the dominant mass. Tissue diagnosis is recommended. 2. No findings of pathologic adenopathy or distant metastatic spread. 3. 9 mm gallstone in the gallbladder. There is mild gallbladder wall thickening which may be from nondistention, correlate clinically in assessing for cholecystitis. 4.  Aortic Atherosclerosis (ICD10-I70.0). 5. Mild diffuse hepatic steatosis.   08/11/2019 Initial Biopsy   DIAGNOSIS: 08/11/19  A. LIVER, RIGHT, BIOPSY:  - Adenocarcinoma.   08/18/2019 Imaging   CT Chest 08/18/19  IMPRESSION: 1. Multiple pulmonary nodules largest at approximately 7 mm in the right lower lobe, nonspecific but concerning given findings in the liver. 2. No signs of definitive metastatic disease, also with mildly enlarged upper abdominal lymph nodes as discussed.   Aortic Atherosclerosis (ICD10-I70.0).   08/19/2019 Initial Diagnosis   Intrahepatic cholangiocarcinoma ([place])   08/19/2019 Cancer Staging   Staging form: Intrahepatic Bile Duct, AJCC 8th Edition - Clinical stage from 08/19/2019: Stage II (cT2, cN0, cM0) - Signed by [name], MD on 08/19/2019   09/29/2019 -  07/19/2020 Chemotherapy   Cisplatin and Gemcitabine 2 weeks on/1 week off starting 09/29/19. Cisplatin held from cycle 9 (03/23/20) due to fluid status/Afib. He is now on maintenance Gemcitabine. Held after 07/19/20 to proceed with liver target therapy.     10/09/2019 Imaging   CT AP IMPRESSION: 1. The dominant right hepatic lobe mass is minimally reduced in size compared to prior exams, currently measuring 6.2 by 5.3 cm, previously 6.2 by 5.6 cm. However, there is a new small hypodense lesion centrally in the right hepatic lobe which is suspicious for a new small focus of tumor. Accordingly this is an overall mixed appearance. 2. Continued hypoenhancement in the liver downstream of the tumor likely attributable to narrowing or occlusion of the right hepatic vein by the tumor. By virtue of its location the tumor wraps around the intrahepatic portion of the IVC. 3. 4 mm right middle lobe pulmonary nodule, stable compared to earliest available comparison of 07/18/2019. Surveillance of the patient's pulmonary nodules is recommended. 4. Other imaging findings of potential clinical significance: Coronary atherosclerosis. Cholelithiasis. Prominent stool throughout the colon favors constipation. Moderate prostatomegaly with heterogeneous enhancement of the prostate gland. Lumbar spondylosis and degenerative disc disease causing mild bilateral foraminal impingement at L3-4 and L4-5.   Aortic Atherosclerosis (ICD10-I70.0).   12/24/2019 Imaging   CT CAP W Contrast  IMPRESSION: 1. Right hepatic lobe mass and adjacent right hepatic lobe nodules appear grossly stable. No evidence of distant metastatic disease. 2. Continued stability of small pulmonary nodules. Recommend attention on follow-up. 3. Cholelithiasis. 4. Enlarged prostate. 5. Aortic atherosclerosis (ICD10-I70.0). Coronary artery calcification.   02/23/2020 Procedure   He had PAC placed on 02/23/20.    04/08/2020 Imaging   CT CAP  IMPRESSION: 1.  Stable or minimally decreased size of a very ill-defined, hypodense and somewhat retractile appearing mass of the central right lobe of the liver abutting the inferior vena cava and right portal vein, measuring approximately 5.7 x 5.0 cm, previously 6.2 x 5.2 cm when measured similarly. Findings are consistent with stable or minimally improved cholangiocarcinoma. 2. Small hypodense nodules of the right lobe identified on prior examination are poorly appreciated on this single phase contrast examination although not grossly changed. Attention on follow-up. 3. There are new, moderate bilateral pleural effusions and associated atelectasis or consolidation as well as a new small pericardial effusion, nonspecific although generally concerning and suspicious for malignant effusions. There is no directly visualized pleural nodularity. 4. Multiple small pulmonary nodules are stable.  No new nodules. 5. Coronary artery disease. Aortic Atherosclerosis (ICD10-I70.0).   07/06/2020 Imaging   MRI ABDIMPRESSION: 1. Interval decrease in size of the right hepatic lobe lesion in the medial aspect of the segment 7. Findings would suggest a good response to treatment with some contraction of the tumor. 2. No new hepatic lesions. No abdominal adenopathy or metastatic disease. 3. Stable mild intrahepatic biliary dilatation in the right hepatic lobe distal to the lesion. 4. Somewhat tortuous and almost beaded appearance of the hepatic and portal vein radicles. Findings could be radiation related. 5. Cholelithiasis.  CT chest wo contrast IMPRESSION: 1. Persistent/stable moderate-sized bilateral pleural effusions with overlying atelectasis. 2. Stable small right pulmonary nodules. No new or progressive findings. Recommend continued surveillance. 3. No mediastinal or hilar mass or adenopathy. 4. Stable advanced three-vessel coronary artery calcifications. 5. Cholelithiasis. Aortic Atherosclerosis  (ICD10-I70.0)   08/11/2020 Procedure   Y90 on 08/11/20 and 08/26/20 with Dr Laurence Ferrari    11/30/2020 Imaging   MRI Abdomen  IMPRESSION: 1.  Today's study demonstrates progression of disease with enlarging central lesion in the right lobe of the liver involving portions of segments 7 and 8, now with evidence of some tumor thrombus extension into the intrahepatic portion of the inferior vena cava. This is associated with increasing intrahepatic biliary ductal dilatation in segment 7 of the liver. 2. In addition, there is some subtle hyperenhancement of the distal common bile duct at and immediately proximally to the level of the ampulla. There is also some subtle delayed enhancement around this region in the pancreatic head. This is of uncertain etiology and significance, but may be inflammatory and currently is not associated with proximal common bile duct dilatation. However, close attention on follow-up imaging is recommended. 3. Cholelithiasis without evidence of acute cholecystitis at this time. 4. Aortic atherosclerosis.      Chemotherapy   Restart Gemcitabine 2 weeks on/1 week off given disease progression        CURRENT THERAPY:  Restart Gemcitabine 2 weeks on/1 week off starting next week   INTERVAL HISTORY:  ASHBY MOSKAL is here for a follow up. He notes he is doing well. He denies any abdominal symptoms from disease progression. He is willing to restart Gemcitabine for treatment.      REVIEW OF SYSTEMS:   Constitutional: Denies fevers, chills or abnormal weight loss Eyes: Denies blurriness of vision Ears, nose, mouth, throat, and face: Denies mucositis or sore throat Respiratory: Denies cough, dyspnea or wheezes Cardiovascular: Denies palpitation, chest discomfort or lower extremity swelling Gastrointestinal:  Denies nausea, heartburn or change in bowel habits Skin: Denies abnormal skin rashes Lymphatics: Denies new lymphadenopathy or easy  bruising Neurological:Denies numbness, tingling or new weaknesses Behavioral/Psych: Mood is stable, no new changes  All other systems were reviewed with the patient and are negative.  MEDICAL HISTORY:  Past Medical History:  Diagnosis Date  . Arthritis   . Diabetes ([place])   . GERD (gastroesophageal reflux disease)   . Hyperlipidemia   . Hypertension   . Intrahepatic cholangiocarcinoma ([place])     SURGICAL HISTORY: Past Surgical History:  Procedure Laterality Date  . APPENDECTOMY  1980  . CARDIOVERSION N/A 04/27/2020   Procedure: CARDIOVERSION;  Surgeon: Dorothy Spark, MD;  Location: [facility] ENDOSCOPY;  Service: Cardiovascular;  Laterality: N/A;  . CARDIOVERSION N/A 05/19/2020   Procedure: CARDIOVERSION;  Surgeon: Elouise Munroe, MD;  Location[facility] ENDOSCOPY;  Service: Cardiovascular;  Laterality: N/A;  . COLONOSCOPY    . IR 3D INDEPENDENT WKST  08/11/2020  . IR 3D INDEPENDENT WKST  08/11/2020  . IR ANGIOGRAM SELECTIVE EACH ADDITIONAL VESSEL  08/11/2020  . IR ANGIOGRAM SELECTIVE EACH ADDITIONAL VESSEL  08/11/2020  . IR ANGIOGRAM SELECTIVE EACH ADDITIONAL VESSEL  08/11/2020  . IR ANGIOGRAM SELECTIVE EACH ADDITIONAL VESSEL  08/11/2020  . IR ANGIOGRAM SELECTIVE EACH ADDITIONAL VESSEL  08/11/2020  . IR ANGIOGRAM SELECTIVE EACH ADDITIONAL VESSEL  08/11/2020  . IR ANGIOGRAM SELECTIVE EACH ADDITIONAL VESSEL  08/26/2020  . IR ANGIOGRAM SELECTIVE EACH ADDITIONAL VESSEL  08/26/2020  . IR ANGIOGRAM VISCERAL SELECTIVE  08/11/2020  . IR ANGIOGRAM VISCERAL SELECTIVE  08/26/2020  . IR EMBO ARTERIAL NOT HEMORR HEMANG INC GUIDE ROADMAPPING  08/11/2020  . IR EMBO TUMOR ORGAN ISCHEMIA INFARCT INC GUIDE ROADMAPPING  08/26/2020  . IR IMAGING GUIDED PORT INSERTION  02/23/2020  . IR RADIOLOGIST EVAL & MGMT  07/14/2020  . IR RADIOLOGIST EVAL & MGMT  09/30/2020  . IR RADIOLOGIST EVAL & MGMT  12/01/2020  . IR US GUIDE VASC  ACCESS LEFT  08/26/2020  . IR US GUIDE VASC ACCESS RIGHT  08/11/2020    I have reviewed the  social history and family history with the patient and they are unchanged from previous note.  ALLERGIES:  has No Known Allergies.  MEDICATIONS:  Current Outpatient Medications  Medication Sig Dispense Refill  . allopurinol (ZYLOPRIM) 100 MG tablet Take 100 mg by mouth daily.    . apixaban (ELIQUIS) 5 MG TABS tablet Take 1 tablet (5 mg total) by mouth 2 (two) times daily. 180 tablet 3  . colchicine 0.6 MG tablet Take 1 tablet (0.6 mg total) by mouth daily as needed (gout flare). Take daily for 3 days, then as needed for gout flare 30 tablet 0  . diltiazem (CARDIZEM CD) 240 MG 24 hr capsule Take 1 capsule (240 mg total) by mouth daily. 90 capsule 3  . fenofibrate (TRICOR) 145 MG tablet Take 145 mg by mouth daily.    . finasteride (PROSCAR) 5 MG tablet Take 5 mg by mouth daily.    . furosemide (LASIX) 40 MG tablet Take 40 mg twice a day 90 tablet 3  . hydrALAZINE (APRESOLINE) 50 MG tablet Take 2 tablets (100 mg total) by mouth in the morning AND 1 tablet (50 mg total) daily with lunch AND 2 tablets (100 mg total) every evening. 320 tablet 1  . KLOR-CON M20 20 MEQ tablet TAKE 1 TABLET BY MOUTH TWICE A DAY 60 tablet 1  . KLOR-CON M20 20 MEQ tablet TAKE 1 TABLET BY MOUTH TWICE A DAY 180 tablet 1  . lidocaine-prilocaine (EMLA) cream Apply 1 application topically as needed. 30 g 3  . magnesium oxide (MAG-OX) 400 MG tablet Take 1 tablet (400 mg total) by mouth daily. 30 tablet 0  . metoprolol succinate (TOPROL XL) 25 MG 24 hr tablet Take 1 tablet (25 mg total) by mouth daily. 90 tablet 3  . ondansetron (ZOFRAN) 8 MG tablet Take 1 tablet (8 mg total) by mouth 2 (two) times daily as needed. Start on the third day after chemotherapy. 30 tablet 1  . pantoprazole (PROTONIX) 40 MG tablet TAKE 1 TABLET BY MOUTH TWICE A DAY 180 tablet 0  . pravastatin (PRAVACHOL) 40 MG tablet Take 40 mg by mouth daily.    . sucralfate (CARAFATE) 1 g tablet Take 1 tablet (1 g total) by mouth every 6 (six) hours as needed.  Please schedule a yearly follow up for further refills: [phone] 60 tablet 0  . tamsulosin (FLOMAX) 0.4 MG CAPS capsule Take 0.4 mg by mouth daily.    . Vitamin D, Ergocalciferol, (DRISDOL) 1.25 MG (50000 UT) CAPS capsule Take 50,000 Units by mouth every 7 (seven) days.     No current facility-administered medications for this visit.    PHYSICAL EXAMINATION: ECOG PERFORMANCE STATUS: 0 - Asymptomatic  No vitals taken today, Exam not performed today   LABORATORY DATA:  I have reviewed the data as listed CBC Latest Ref Rng & Units 11/26/2020 09/14/2020 08/26/2020  WBC 4.0 - 10.5 K/uL 6.1 4.6 6.7  Hemoglobin 13.0 - 17.0 g/dL 12.7(L) 10.9(L) 10.5(L)  Hematocrit 39.0 - 52.0 % 38.7(L) 33.5(L) 31.6(L)  Platelets 150 - 400 K/uL 223 229 211     CMP Latest Ref Rng & Units 11/26/2020 09/14/2020 09/14/2020  Glucose 70 - 99 mg/dL 118(H) 101(H) 100(H)  BUN 8 - 23 mg/dL 28(H) 22 21  Creatinine 0.61 - 1.24 mg/dL 1.96(H) 1.58(H) 1.54(H)  Sodium 135 - 145 mmol/L 136 138 140  Potassium   3.5 - 5.1 mmol/L 4.1 3.6 3.9  Chloride 98 - 111 mmol/L 103 104 105  CO2 22 - 32 mmol/L _0 Calcium 8.9 - 10.3 mg/dL 9.9 8.9 8.8  Total Protein 6.5 - 8.1 g/dL 7.4 6.6 -  Total Bilirubin 0.3 - 1.2 mg/dL 0.6 0.6 -  Alkaline Phos 38 - 126 U/L 48 42 -  AST 15 - 41 U/L 24 25 -  ALT 0 - 44 U/L 13 16 -      RADIOGRAPHIC STUDIES: I have personally reviewed the radiological images as listed and agreed with the findings in the report. No results found.   ASSESSMENT & PLAN:  Brandon Sherman is a 75 y.o. male with   1.Intrahepatic cholangiocarcinoma, cT2N0Mx,unresectable,with indeterminate lung nodules -He was diagnosed in 07/2019.CT scans and MRI liver showa large7.3cm mass in the right hepatic lobe whichabutsportal vein.  -He was seen byour local surgeon Dr. Barry Dienes andDr Carlis Abbott at [facility] concluded that cancer is not resectable due to the invasion to portal vein. Given cancer is non-resectable his  cancer is likely not curable but still treatable. -He was treated with standardfirst line chemo with IV Cisplatin and Gemcitabine 2 weeks on/1 weekoff beginning 09/29/19.Cisplatin was held since C9 (03/23/20) due to fluid status/AFib. He continued maintenance Gemcitabine until 07/19/20. He proceeded with Y90 on 08/11/20 and 08/26/20. He is currently on observation.  -His MRI Abdomen from 12/11/20 showed progression of disease in liver with tumor thrombus extension into IVC. I personally discussed with patient today.  -I discussed with disease progression, Dr Geroge Baseman does not recommend more Y90 or liver target therapy at this point.  -I recommend treatment with systemic treatment to control his disease and prolong his life. I discussed restarting single agent Gemcitabine 2 weeks on/1 week off. He previously had good response to cis/gem  -He has IDH-1 mutation, which makes him eligible for an oral IDH inhibitor drug Ivosidenib. I would recommend this in later lines of treatment as I recommend he start with chemo first.  -He agreed to proceed with restarting Gemcitabine. Plan to start next week. F/u before with start of treatment.    2. DM, HTN, HLD, Gout, LE edema -On Metformin, amlodipine, lisinopril., allopurinol, Lasix. Continue to f/u with his PCPand cardiologist.  3.02/2020 onset AF and 04/2020 CHF -With [facility] placement on 02/23/20 he was seen to have Afib. Based on [place].He is at moderate risk for stroke. -He is now being seen by Cardiologist, on Eliquis, Lasix, Toprolol. -HeunderwentCardioversion with Dr. Meda Coffee on 04/27/20. After [facility] late 6/2021he had another cardioversion on 05/19/20. He has recovered well.  4. Nicotine Use -He never smoked but has been chewing Tobacco for the past 60 years.He no longer drinks alcohol. -He currently chews 1-2 times a day.Iagainencouraged him toreduce and quit completely.  5.GERDand gastritis, history ofesophageal  candidiasis -Hehad repeated EGDwith Dr. Havery Moros in 07/2019. 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  -Will monitor andmayrepeat EGDin future   PLAN: -MRI reviewed, shows disease progression in liver  -Lab, flush, Gemcitabine in 1 and 2 weeks  -F/u in 1 week    No problem-specific Assessment & Plan notes found for this encounter.   No orders of the defined types were placed in this encounter.  I discussed the assessment and treatment plan with the patient. The patient was provided an opportunity to ask questions and all were answered. The patient agreed with the plan and demonstrated  an understanding of the instructions.  The patient was advised to call back or seek an in-person evaluation if the symptoms worsen or if the condition fails to improve as anticipated.  The total time spent in the appointment was 25 minutes.    Yan Feng, MD 12/13/2020   I, Amoya Bennett, am acting as scribe for Yan Feng, MD.   I have reviewed the above documentation for accuracy and completeness, and I agree with the above.       

## 2020-12-14 ENCOUNTER — Encounter: Payer: Self-pay | Admitting: Hematology

## 2020-12-15 ENCOUNTER — Telehealth: Payer: Self-pay | Admitting: Hematology

## 2020-12-15 NOTE — Telephone Encounter (Signed)
Scheduled appts per 1/24 los. Pt's wife confirmed appt dates and times.

## 2020-12-16 DIAGNOSIS — M109 Gout, unspecified: Secondary | ICD-10-CM | POA: Diagnosis not present

## 2020-12-16 DIAGNOSIS — N182 Chronic kidney disease, stage 2 (mild): Secondary | ICD-10-CM | POA: Diagnosis not present

## 2020-12-16 DIAGNOSIS — I129 Hypertensive chronic kidney disease with stage 1 through stage 4 chronic kidney disease, or unspecified chronic kidney disease: Secondary | ICD-10-CM | POA: Diagnosis not present

## 2020-12-16 DIAGNOSIS — C221 Intrahepatic bile duct carcinoma: Secondary | ICD-10-CM | POA: Diagnosis not present

## 2020-12-16 DIAGNOSIS — I4891 Unspecified atrial fibrillation: Secondary | ICD-10-CM | POA: Diagnosis not present

## 2020-12-16 DIAGNOSIS — Z79899 Other long term (current) drug therapy: Secondary | ICD-10-CM | POA: Diagnosis not present

## 2020-12-16 DIAGNOSIS — Z1331 Encounter for screening for depression: Secondary | ICD-10-CM | POA: Diagnosis not present

## 2020-12-16 DIAGNOSIS — E1122 Type 2 diabetes mellitus with diabetic chronic kidney disease: Secondary | ICD-10-CM | POA: Diagnosis not present

## 2020-12-16 DIAGNOSIS — I10 Essential (primary) hypertension: Secondary | ICD-10-CM | POA: Diagnosis not present

## 2020-12-16 DIAGNOSIS — E785 Hyperlipidemia, unspecified: Secondary | ICD-10-CM | POA: Diagnosis not present

## 2020-12-16 DIAGNOSIS — K219 Gastro-esophageal reflux disease without esophagitis: Secondary | ICD-10-CM | POA: Diagnosis not present

## 2020-12-16 DIAGNOSIS — Z9181 History of falling: Secondary | ICD-10-CM | POA: Diagnosis not present

## 2020-12-22 NOTE — Progress Notes (Signed)
[place]   Telephone:([phone]) [phone] Fax:(336) [phone]   Clinic Follow up Note   Patient Care Team: [name], PA as PCP - General (Family Medicine) Lorretta Harp, MD as PCP - Cardiology (Cardiology) [name], MD as Consulting Physician (General Surgery) [name], [name], MD as Consulting Physician (Gastroenterology) [name], MD as Consulting Physician (Hematology) Lorretta Harp, MD as Consulting Physician (Cardiology)  Date of Service:  12/24/2020  CHIEF COMPLAINT: F/u cholangiocarcinoma  SUMMARY OF ONCOLOGIC HISTORY: Oncology History Overview Note  Cancer Staging Intrahepatic cholangiocarcinoma ([place]) Staging form: Intrahepatic Bile Duct, AJCC 8th Edition - Clinical stage from 08/19/2019: Stage II (cT2, cN0, cM0) - Signed by [name], MD on 08/19/2019    Intrahepatic cholangiocarcinoma ([place])  06/28/2019 Imaging   CT AP W Contrast 06/28/19  IMPRESSION: 1. Heterogeneous hypodensity posteriorly in the right hepatic lobe and potentially extending into the caudate lobe suspicious for a mass. There is felt to be truncation of branches of the portal vein in this vicinity and some narrowing of the hepatic vein, as well as triangular-shaped regions of abnormal hypoenhancement posteriorly in the right hepatic lobe likely representing downstream vascular effects. Cannot exclude malignancy such as cholangiocarcinoma or hepatocellular carcinoma, and follow up hepatic protocol MRI with and without contrast is recommended to further characterize. 2. 4 mm right middle lobe pulmonary nodule is likely benign but may merit surveillance. 3. Cholelithiasis. 4.  Aortic Atherosclerosis (ICD10-I70.0). 5. Prostatomegaly. 6. Mild impingement at L3-4 and L4-5.   07/31/2019 Imaging   MRI Liver 07/31/19 IMPRESSION: 1. 7.3 cm in long axis mass in the right hepatic lobe spanning into the caudate lobe, high suspicion for malignancy such as hepatocellular carcinoma  or cholangiocarcinoma. Suspected effacement or occlusion of the right hepatic vein and posterior branches of the right portal vein. Two smaller tumor nodules along the posterior periphery of the dominant mass. Tissue diagnosis is recommended. 2. No findings of pathologic adenopathy or distant metastatic spread. 3. 9 mm gallstone in the gallbladder. There is mild gallbladder wall thickening which may be from nondistention, correlate clinically in assessing for cholecystitis. 4.  Aortic Atherosclerosis (ICD10-I70.0). 5. Mild diffuse hepatic steatosis.   08/11/2019 Initial Biopsy   DIAGNOSIS: 08/11/19  A. LIVER, RIGHT, BIOPSY:  - Adenocarcinoma.   08/18/2019 Imaging   CT Chest 08/18/19  IMPRESSION: 1. Multiple pulmonary nodules largest at approximately 7 mm in the right lower lobe, nonspecific but concerning given findings in the liver. 2. No signs of definitive metastatic disease, also with mildly enlarged upper abdominal lymph nodes as discussed.   Aortic Atherosclerosis (ICD10-I70.0).   08/19/2019 Initial Diagnosis   Intrahepatic cholangiocarcinoma ([place])   08/19/2019 Cancer Staging   Staging form: Intrahepatic Bile Duct, AJCC 8th Edition - Clinical stage from 08/19/2019: Stage II (cT2, cN0, cM0) - Signed by [name], MD on 08/19/2019   09/29/2019 - 07/19/2020 Chemotherapy   Cisplatin and Gemcitabine 2 weeks on/1 week off starting 09/29/19. Cisplatin held from cycle 9 (03/23/20) due to fluid status/Afib. He is now on maintenance Gemcitabine. Held after 07/19/20 to proceed with liver target therapy.     10/09/2019 Imaging   CT AP IMPRESSION: 1. The dominant right hepatic lobe mass is minimally reduced in size compared to prior exams, currently measuring 6.2 by 5.3 cm, previously 6.2 by 5.6 cm. However, there is a new small hypodense lesion centrally in the right hepatic lobe which is suspicious for a new small focus of tumor. Accordingly this is an overall mixed appearance. 2.  Continued  hypoenhancement in the liver downstream of the tumor likely attributable to narrowing or occlusion of the right hepatic vein by the tumor. By virtue of its location the tumor wraps around the intrahepatic portion of the IVC. 3. 4 mm right middle lobe pulmonary nodule, stable compared to earliest available comparison of 07/18/2019. Surveillance of the patient's pulmonary nodules is recommended. 4. Other imaging findings of potential clinical significance: Coronary atherosclerosis. Cholelithiasis. Prominent stool throughout the colon favors constipation. Moderate prostatomegaly with heterogeneous enhancement of the prostate gland. Lumbar spondylosis and degenerative disc disease causing mild bilateral foraminal impingement at L3-4 and L4-5.   Aortic Atherosclerosis (ICD10-I70.0).   12/24/2019 Imaging   CT CAP W Contrast  IMPRESSION: 1. Right hepatic lobe mass and adjacent right hepatic lobe nodules appear grossly stable. No evidence of distant metastatic disease. 2. Continued stability of small pulmonary nodules. Recommend attention on follow-up. 3. Cholelithiasis. 4. Enlarged prostate. 5. Aortic atherosclerosis (ICD10-I70.0). Coronary artery calcification.   02/23/2020 Procedure   He had PAC placed on 02/23/20.    04/08/2020 Imaging   CT CAP  IMPRESSION: 1. Stable or minimally decreased size of a very ill-defined, hypodense and somewhat retractile appearing mass of the central right lobe of the liver abutting the inferior vena cava and right portal vein, measuring approximately 5.7 x 5.0 cm, previously 6.2 x 5.2 cm when measured similarly. Findings are consistent with stable or minimally improved cholangiocarcinoma. 2. Small hypodense nodules of the right lobe identified on prior examination are poorly appreciated on this single phase contrast examination although not grossly changed. Attention on follow-up. 3. There are new, moderate bilateral pleural effusions and associated atelectasis  or consolidation as well as a new small pericardial effusion, nonspecific although generally concerning and suspicious for malignant effusions. There is no directly visualized pleural nodularity. 4. Multiple small pulmonary nodules are stable.  No new nodules. 5. Coronary artery disease. Aortic Atherosclerosis (ICD10-I70.0).   07/06/2020 Imaging   MRI ABDIMPRESSION: 1. Interval decrease in size of the right hepatic lobe lesion in the medial aspect of the segment 7. Findings would suggest a good response to treatment with some contraction of the tumor. 2. No new hepatic lesions. No abdominal adenopathy or metastatic disease. 3. Stable mild intrahepatic biliary dilatation in the right hepatic lobe distal to the lesion. 4. Somewhat tortuous and almost beaded appearance of the hepatic and portal vein radicles. Findings could be radiation related. 5. Cholelithiasis.  CT chest wo contrast IMPRESSION: 1. Persistent/stable moderate-sized bilateral pleural effusions with overlying atelectasis. 2. Stable small right pulmonary nodules. No new or progressive findings. Recommend continued surveillance. 3. No mediastinal or hilar mass or adenopathy. 4. Stable advanced three-vessel coronary artery calcifications. 5. Cholelithiasis. Aortic Atherosclerosis (ICD10-I70.0)   08/11/2020 Procedure   Y90 on 08/11/20 and 08/26/20 with Dr Laurence Ferrari    11/30/2020 Imaging   MRI Abdomen  IMPRESSION: 1. Today's study demonstrates progression of disease with enlarging central lesion in the right lobe of the liver involving portions of segments 7 and 8, now with evidence of some tumor thrombus extension into the intrahepatic portion of the inferior vena cava. This is associated with increasing intrahepatic biliary ductal dilatation in segment 7 of the liver. 2. In addition, there is some subtle hyperenhancement of the distal common bile duct at and immediately proximally to the level of the ampulla. There is  also some subtle delayed enhancement around this region in the pancreatic head. This is of uncertain etiology and significance, but may be inflammatory and currently is  not associated with proximal common bile duct dilatation. However, close attention on follow-up imaging is recommended. 3. Cholelithiasis without evidence of acute cholecystitis at this time. 4. Aortic atherosclerosis.     12/24/2020 -  Chemotherapy   Restart Gemcitabine 2 weeks on/1 week off given disease progression beginning 12/24/20        CURRENT THERAPY:  Restarted Gemcitabine 2 weeks on/1 week off starting 12/24/20  INTERVAL HISTORY:  Brandon Sherman is here for a follow up. He presents to the clinic alone. He notes he is stable and overall feels good. He denies any abdominal pain. He denies issues form prior chemo. He notes his Afib is intermittent and he is on medication for this.    REVIEW OF SYSTEMS:   Constitutional: Denies fevers, chills or abnormal weight loss Eyes: Denies blurriness of vision Ears, nose, mouth, throat, and face: Denies mucositis or sore throat Respiratory: Denies cough, dyspnea or wheezes Cardiovascular: Denies palpitation, chest discomfort or lower extremity swelling Gastrointestinal:  Denies nausea, heartburn or change in bowel habits Skin: Denies abnormal skin rashes Lymphatics: Denies new lymphadenopathy or easy bruising Neurological:Denies numbness, tingling or new weaknesses Behavioral/Psych: Mood is stable, no new changes  All other systems were reviewed with the patient and are negative.  MEDICAL HISTORY:  Past Medical History:  Diagnosis Date  . Arthritis   . Diabetes ([place])   . GERD (gastroesophageal reflux disease)   . Hyperlipidemia   . Hypertension   . Intrahepatic cholangiocarcinoma ([place])     SURGICAL HISTORY: Past Surgical History:  Procedure Laterality Date  . APPENDECTOMY  1980  . CARDIOVERSION N/A 04/27/2020   Procedure: CARDIOVERSION;  Surgeon: Dorothy Spark, MD;  Location: [facility] ENDOSCOPY;  Service: Cardiovascular;  Laterality: N/A;  . CARDIOVERSION N/A 05/19/2020   Procedure: CARDIOVERSION;  Surgeon: Elouise Munroe, MD;  Location: [facility] ENDOSCOPY;  Service: Cardiovascular;  Laterality: N/A;  . COLONOSCOPY    . IR 3D INDEPENDENT WKST  08/11/2020  . IR 3D INDEPENDENT WKST  08/11/2020  . IR ANGIOGRAM SELECTIVE EACH ADDITIONAL VESSEL  08/11/2020  . IR ANGIOGRAM SELECTIVE EACH ADDITIONAL VESSEL  08/11/2020  . IR ANGIOGRAM SELECTIVE EACH ADDITIONAL VESSEL  08/11/2020  . IR ANGIOGRAM SELECTIVE EACH ADDITIONAL VESSEL  08/11/2020  . IR ANGIOGRAM SELECTIVE EACH ADDITIONAL VESSEL  08/11/2020  . IR ANGIOGRAM SELECTIVE EACH ADDITIONAL VESSEL  08/11/2020  . IR ANGIOGRAM SELECTIVE EACH ADDITIONAL VESSEL  08/26/2020  . IR ANGIOGRAM SELECTIVE EACH ADDITIONAL VESSEL  08/26/2020  . IR ANGIOGRAM VISCERAL SELECTIVE  08/11/2020  . IR ANGIOGRAM VISCERAL SELECTIVE  08/26/2020  . IR EMBO ARTERIAL NOT HEMORR HEMANG INC GUIDE ROADMAPPING  08/11/2020  . IR EMBO TUMOR ORGAN ISCHEMIA INFARCT INC GUIDE ROADMAPPING  08/26/2020  . IR IMAGING GUIDED PORT INSERTION  02/23/2020  . IR RADIOLOGIST EVAL & MGMT  07/14/2020  . IR RADIOLOGIST EVAL & MGMT  09/30/2020  . IR RADIOLOGIST EVAL & MGMT  12/01/2020  . IR US GUIDE VASC ACCESS LEFT  08/26/2020  . IR US GUIDE VASC ACCESS RIGHT  08/11/2020    I have reviewed the social history and family history with the patient and they are unchanged from previous note.  ALLERGIES:  has No Known Allergies.  MEDICATIONS:  Current Outpatient Medications  Medication Sig Dispense Refill  . allopurinol (ZYLOPRIM) 100 MG tablet Take 100 mg by mouth daily.    Marland Kitchen apixaban (ELIQUIS) 5 MG TABS tablet Take 1 tablet (5 mg total) by mouth 2 (two) times daily. 180 tablet  3  . colchicine 0.6 MG tablet Take 1 tablet (0.6 mg total) by mouth daily as needed (gout flare). Take daily for 3 days, then as needed for gout flare 30 tablet 0  . diltiazem (CARDIZEM CD) 240  MG 24 hr capsule Take 1 capsule (240 mg total) by mouth daily. 90 capsule 3  . fenofibrate (TRICOR) 145 MG tablet Take 145 mg by mouth daily.    . finasteride (PROSCAR) 5 MG tablet Take 5 mg by mouth daily.    . furosemide (LASIX) 40 MG tablet Take 40 mg twice a day 90 tablet 3  . hydrALAZINE (APRESOLINE) 50 MG tablet Take 2 tablets (100 mg total) by mouth in the morning AND 1 tablet (50 mg total) daily with lunch AND 2 tablets (100 mg total) every evening. 320 tablet 1  . KLOR-CON M20 20 MEQ tablet TAKE 1 TABLET BY MOUTH TWICE A DAY 60 tablet 1  . KLOR-CON M20 20 MEQ tablet TAKE 1 TABLET BY MOUTH TWICE A DAY 180 tablet 1  . lidocaine-prilocaine (EMLA) cream Apply 1 application topically as needed. 30 g 3  . magnesium oxide (MAG-OX) 400 MG tablet Take 1 tablet (400 mg total) by mouth daily. 30 tablet 0  . metoprolol succinate (TOPROL XL) 25 MG 24 hr tablet Take 1 tablet (25 mg total) by mouth daily. 90 tablet 3  . ondansetron (ZOFRAN) 8 MG tablet Take 1 tablet (8 mg total) by mouth every 8 (eight) hours as needed for nausea. Start on the third day after chemotherapy. 30 tablet 1  . pantoprazole (PROTONIX) 40 MG tablet TAKE 1 TABLET BY MOUTH TWICE A DAY 180 tablet 0  . pravastatin (PRAVACHOL) 40 MG tablet Take 40 mg by mouth daily.    . sucralfate (CARAFATE) 1 g tablet Take 1 tablet (1 g total) by mouth every 6 (six) hours as needed. Please schedule a yearly follow up for further refills: [id number] 60 tablet 0  . tamsulosin (FLOMAX) 0.4 MG CAPS capsule Take 0.4 mg by mouth daily.    . Vitamin D, Ergocalciferol, (DRISDOL) 1.25 MG (50000 UT) CAPS capsule Take 50,000 Units by mouth every 7 (seven) days.     No current facility-administered medications for this visit.    PHYSICAL EXAMINATION: ECOG PERFORMANCE STATUS: 1 - Symptomatic but completely ambulatory  Vitals:   12/24/20 1126  BP: (!) 143/73  Pulse: 84  Resp: 16  Temp: 97.8 F (36.6 C)  SpO2: 98%   Filed Weights   12/24/20 1126   Weight: 202 lb 11.2 oz (91.9 kg)    Due to COVID19 we will limit examination to appearance. Patient had no complaints.  GENERAL:alert, no distress and comfortable SKIN: skin color normal, no rashes or significant lesions EYES: normal, Conjunctiva are pink and non-injected, sclera clear  NEURO: alert & oriented x 3 with fluent speech   LABORATORY DATA:  I have reviewed the data as listed CBC Latest Ref Rng & Units 12/24/2020 11/26/2020 09/14/2020  WBC 4.0 - 10.5 K/uL 6.5 6.1 4.6  Hemoglobin 13.0 - 17.0 g/dL 12.8(L) 12.7(L) 10.9(L)  Hematocrit 39.0 - 52.0 % 39.2 38.7(L) 33.5(L)  Platelets 150 - 400 K/uL 237 223 229     CMP Latest Ref Rng & Units 11/26/2020 09/14/2020 09/14/2020  Glucose 70 - 99 mg/dL 118(H) 101(H) 100(H)  BUN 8 - 23 mg/dL 28(H) 22 21  Creatinine 0.61 - 1.24 mg/dL 1.96(H) 1.58(H) 1.54(H)  Sodium 135 - 145 mmol/L 136 138 140  Potassium 3.5 - 5.1 mmol/L  4.1 3.6 3.9  Chloride 98 - 111 mmol/L 103 104 105  CO2 22 - 32 mmol/L _0 Calcium 8.9 - 10.3 mg/dL 9.9 8.9 8.8  Total Protein 6.5 - 8.1 g/dL 7.4 6.6 -  Total Bilirubin 0.3 - 1.2 mg/dL 0.6 0.6 -  Alkaline Phos 38 - 126 U/L 48 42 -  AST 15 - 41 U/L 24 25 -  ALT 0 - 44 U/L 13 16 -      RADIOGRAPHIC STUDIES: I have personally reviewed the radiological images as listed and agreed with the findings in the report. No results found.   ASSESSMENT & PLAN:  Brandon Sherman is a 75 y.o. male with    1.Intrahepatic cholangiocarcinoma, cT2N0Mx,unresectable,with indeterminate lung nodules -He was diagnosed in 07/2019.CT scans and MRI liver showa large7.3cm mass in the right hepatic lobe whichabutsportal vein.  -He was seen byour local surgeon Dr. Barry Dienes andDr Carlis Abbott at [facility] concluded that cancer is not resectable due to the invasion to portal vein.Given cancer is non-resectable his cancer is likely not curable but still treatable. -He was treated with standardfirst line chemo with IV Cisplatin and  Gemcitabine 2 weeks on/1 weekoff beginning 09/29/19.Cisplatin was held since C9 (03/23/20) due to fluid status/AFib. Hecontinued maintenanceGemcitabineuntil 07/19/20. He proceeded with Y90 on 08/11/20 and 08/26/20.  -His MRI Abdomen from 12/11/20 showed progression of disease in liver with tumor thrombus extension into IVC. I discussed with disease progression, Dr Geroge Baseman does not recommend more Y90 or liver target therapy at this point.  -To control his disease I recommended restarted Single agent Gemcitabine 2 weeks on/1 week off. Plan to start today (12/24/20). -He has IDH-1 mutation, which makes him eligible for an oral IDH inhibitor drug Ivosidenib. I would recommend this in later lines of treatment.  -Labs reviewed, CBC and CMP  WNL except Hg 12.8. Overall adequate to proceed with start of Gemcitabine. Given recent CMP will reduce chemo dose by 10%.  -F/u in 3 weeks.    2. DM, HTN, HLD, Gout, LE edema -On Metformin, amlodipine, lisinopril., allopurinol, Lasix.Continue to f/u with his PCPand cardiologist.  3.AF andCHF -With PAC placement on 02/23/20 he was found to have Afib. Based on [place].He is at moderate risk for stroke. -HeunderwentCardioversion with Dr. Meda Coffee on 04/27/20. After [facility] late 6/2021he had another cardioversion on 05/19/20. He has recovered well. -His Afib is intermittent. He will continue his medications and f/u with cardiologist.  -he is on Eliquis   4. Nicotine Use -He never smoked but has been chewing Tobacco for the past 60 years.He no longer drinks alcohol. -Hecurrentlychews 1-2 times a day.Iagainencouraged him toreduce and quit completely.  5.GERDand gastritis, history ofesophageal candidiasis -Hehad repeated EGDwith Dr. Havery Moros in 07/2019. 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  -Will monitor andmayrepeat EGDin  future   PLAN: -Labs reviewed, adequate to proceed with C1D1 Gemcitabine today, will reduce gemcitabine to 919m/m2 due to his CKD -Lab, flush, Gemcitabine in 1, 3, 4 weeks  -F/u in 3 weeks    No problem-specific Assessment & Plan notes found for this encounter.   No orders of the defined types were placed in this encounter.  All questions were answered. The patient knows to call the clinic with any problems, questions or concerns. No barriers to learning was detected. The total time spent in the appointment was 30 minutes.     YTruitt Merle MD 12/24/2020   I, AJoslyn Devon  am acting as scribe for Catheryne Deford, MD.   I have reviewed the above documentation for accuracy and completeness, and I agree with the above.       

## 2020-12-24 ENCOUNTER — Inpatient Hospital Stay: Payer: Medicare HMO

## 2020-12-24 ENCOUNTER — Telehealth: Payer: Self-pay | Admitting: Hematology

## 2020-12-24 ENCOUNTER — Inpatient Hospital Stay: Payer: Medicare HMO | Attending: Hematology | Admitting: Hematology

## 2020-12-24 ENCOUNTER — Other Ambulatory Visit: Payer: Self-pay

## 2020-12-24 ENCOUNTER — Encounter: Payer: Self-pay | Admitting: Hematology

## 2020-12-24 VITALS — BP 143/73 | HR 84 | Temp 97.8°F | Resp 16 | Ht 69.0 in | Wt 202.7 lb

## 2020-12-24 DIAGNOSIS — Z7189 Other specified counseling: Secondary | ICD-10-CM | POA: Diagnosis not present

## 2020-12-24 DIAGNOSIS — Z5111 Encounter for antineoplastic chemotherapy: Secondary | ICD-10-CM | POA: Insufficient documentation

## 2020-12-24 DIAGNOSIS — C221 Intrahepatic bile duct carcinoma: Secondary | ICD-10-CM

## 2020-12-24 LAB — CBC WITH DIFFERENTIAL (CANCER CENTER ONLY)
Abs Immature Granulocytes: 0.02 10*3/uL (ref 0.00–0.07)
Basophils Absolute: 0.1 10*3/uL (ref 0.0–0.1)
Basophils Relative: 1 %
Eosinophils Absolute: 0.2 10*3/uL (ref 0.0–0.5)
Eosinophils Relative: 3 %
HCT: 39.2 % (ref 39.0–52.0)
Hemoglobin: 12.8 g/dL — ABNORMAL LOW (ref 13.0–17.0)
Immature Granulocytes: 0 %
Lymphocytes Relative: 16 %
Lymphs Abs: 1 10*3/uL (ref 0.7–4.0)
MCH: 30.3 pg (ref 26.0–34.0)
MCHC: 32.7 g/dL (ref 30.0–36.0)
MCV: 92.7 fL (ref 80.0–100.0)
Monocytes Absolute: 0.7 10*3/uL (ref 0.1–1.0)
Monocytes Relative: 10 %
Neutro Abs: 4.5 10*3/uL (ref 1.7–7.7)
Neutrophils Relative %: 70 %
Platelet Count: 237 10*3/uL (ref 150–400)
RBC: 4.23 MIL/uL (ref 4.22–5.81)
RDW: 14.3 % (ref 11.5–15.5)
WBC Count: 6.5 10*3/uL (ref 4.0–10.5)
nRBC: 0 % (ref 0.0–0.2)

## 2020-12-24 LAB — CMP (CANCER CENTER ONLY)
ALT: 18 U/L (ref 0–44)
AST: 26 U/L (ref 15–41)
Albumin: 3.9 g/dL (ref 3.5–5.0)
Alkaline Phosphatase: 47 U/L (ref 38–126)
Anion gap: 7 (ref 5–15)
BUN: 28 mg/dL — ABNORMAL HIGH (ref 8–23)
CO2: 26 mmol/L (ref 22–32)
Calcium: 9.7 mg/dL (ref 8.9–10.3)
Chloride: 103 mmol/L (ref 98–111)
Creatinine: 1.81 mg/dL — ABNORMAL HIGH (ref 0.61–1.24)
GFR, Estimated: 39 mL/min — ABNORMAL LOW (ref >60)
Glucose, Bld: 117 mg/dL — ABNORMAL HIGH (ref 70–99)
Potassium: 4.1 mmol/L (ref 3.5–5.1)
Sodium: 136 mmol/L (ref 135–145)
Total Bilirubin: 0.5 mg/dL (ref 0.3–1.2)
Total Protein: 7.1 g/dL (ref 6.5–8.1)

## 2020-12-24 MED ORDER — SODIUM CHLORIDE 0.9 % IV SOLN
900.0000 mg/m2 | Freq: Once | INTRAVENOUS | Status: AC
  Administered 2020-12-24: 1938 mg via INTRAVENOUS
  Filled 2020-12-24: qty 50.97

## 2020-12-24 MED ORDER — ONDANSETRON HCL 8 MG PO TABS: 8.0000 mg | ORAL_TABLET | Freq: Three times a day (TID) | ORAL | 1 refills | Status: DC | PRN

## 2020-12-24 MED ORDER — SODIUM CHLORIDE 0.9% FLUSH
10.0000 mL | INTRAVENOUS | Status: DC | PRN
  Filled 2020-12-24: qty 10

## 2020-12-24 MED ORDER — HEPARIN SOD (PORK) LOCK FLUSH 100 UNIT/ML IV SOLN
500.0000 [IU] | Freq: Once | INTRAVENOUS | Status: DC | PRN
  Filled 2020-12-24: qty 5

## 2020-12-24 MED ORDER — PROCHLORPERAZINE MALEATE 10 MG PO TABS
10.0000 mg | ORAL_TABLET | Freq: Once | ORAL | Status: AC
  Administered 2020-12-24: 10 mg via ORAL

## 2020-12-24 MED ORDER — PROCHLORPERAZINE MALEATE 10 MG PO TABS
ORAL_TABLET | ORAL | Status: AC
  Filled 2020-12-24: qty 1

## 2020-12-24 MED ORDER — SODIUM CHLORIDE 0.9 % IV SOLN
Freq: Once | INTRAVENOUS | Status: AC
  Filled 2020-12-24: qty 250

## 2020-12-24 NOTE — Patient Instructions (Addendum)
[facility] Discharge Instructions for Patients Receiving Chemotherapy  Today you received the following chemotherapy agent: Gemcitabine (Gemzar)  To help prevent nausea and vomiting after your treatment, we encourage you to take your nausea medication as directed by your MD.   If you develop nausea and vomiting that is not controlled by your nausea medication, call the clinic.   BELOW ARE SYMPTOMS THAT SHOULD BE REPORTED IMMEDIATELY:  *FEVER GREATER THAN 100.5 F  *CHILLS WITH OR WITHOUT FEVER  NAUSEA AND VOMITING THAT IS NOT CONTROLLED WITH YOUR NAUSEA MEDICATION  *UNUSUAL SHORTNESS OF BREATH  *UNUSUAL BRUISING OR BLEEDING  TENDERNESS IN MOUTH AND THROAT WITH OR WITHOUT PRESENCE OF ULCERS  *URINARY PROBLEMS  *BOWEL PROBLEMS  UNUSUAL RASH Items with * indicate a potential emergency and should be followed up as soon as possible.  Feel free to call the clinic should you have any questions or concerns. The clinic phone number is [phone].  Please show the [facility] at check-in to the Emergency Department and triage nurse.  Gemcitabine injection What is this medicine? GEMCITABINE (jem SYE ta been) is a chemotherapy drug. This medicine is used to treat many types of cancer like breast cancer, lung cancer, pancreatic cancer, and ovarian cancer. This medicine may be used for other purposes; ask your health care provider or pharmacist if you have questions. COMMON BRAND NAME(S): Gemzar, Infugem What should I tell my health care provider before I take this medicine? They need to know if you have any of these conditions:  blood disorders  infection  kidney disease  liver disease  lung or breathing disease, like asthma  recent or ongoing radiation therapy  an unusual or allergic reaction to gemcitabine, other chemotherapy, other medicines, foods, dyes, or preservatives  pregnant or trying to get pregnant  breast-feeding How should I use this  medicine? This drug is given as an infusion into a vein. It is administered in a hospital or clinic by a specially trained health care professional. Talk to your pediatrician regarding the use of this medicine in children. Special care may be needed. Overdosage: If you think you have taken too much of this medicine contact a poison control center or emergency room at once. NOTE: This medicine is only for you. Do not share this medicine with others. What if I miss a dose? It is important not to miss your dose. Call your doctor or health care professional if you are unable to keep an appointment. What may interact with this medicine?  medicines to increase blood counts like filgrastim, pegfilgrastim, sargramostim  some other chemotherapy drugs like cisplatin  vaccines Talk to your doctor or health care professional before taking any of these medicines:  acetaminophen  aspirin  ibuprofen  ketoprofen  naproxen This list may not describe all possible interactions. Give your health care provider a list of all the medicines, herbs, non-prescription drugs, or dietary supplements you use. Also tell them if you smoke, drink alcohol, or use illegal drugs. Some items may interact with your medicine. What should I watch for while using this medicine? Visit your doctor for checks on your progress. This drug may make you feel generally unwell. This is not uncommon, as chemotherapy can affect healthy cells as well as cancer cells. Report any side effects. Continue your course of treatment even though you feel ill unless your doctor tells you to stop. In some cases, you may be given additional medicines to help with side effects. Follow all directions  for their use. Call your doctor or health care professional for advice if you get a fever, chills or sore throat, or other symptoms of a cold or flu. Do not treat yourself. This drug decreases your body's ability to fight infections. Try to avoid being  around people who are sick. This medicine may increase your risk to bruise or bleed. Call your doctor or health care professional if you notice any unusual bleeding. Be careful brushing and flossing your teeth or using a toothpick because you may get an infection or bleed more easily. If you have any dental work done, tell your dentist you are receiving this medicine. Avoid taking products that contain aspirin, acetaminophen, ibuprofen, naproxen, or ketoprofen unless instructed by your doctor. These medicines may hide a fever. Do not become pregnant while taking this medicine or for 6 months after stopping it. Women should inform their doctor if they wish to become pregnant or think they might be pregnant. Men should not father a child while taking this medicine and for 3 months after stopping it. There is a potential for serious side effects to an unborn child. Talk to your health care professional or pharmacist for more information. Do not breast-feed an infant while taking this medicine or for at least 1 week after stopping it. Men should inform their doctors if they wish to father a child. This medicine may lower sperm counts. Talk with your doctor or health care professional if you are concerned about your fertility. What side effects may I notice from receiving this medicine? Side effects that you should report to your doctor or health care professional as soon as possible:  allergic reactions like skin rash, itching or hives, swelling of the face, lips, or tongue  breathing problems  pain, redness, or irritation at site where injected  signs and symptoms of a dangerous change in heartbeat or heart rhythm like chest pain; dizziness; fast or irregular heartbeat; palpitations; feeling faint or lightheaded, falls; breathing problems  signs of decreased platelets or bleeding - bruising, pinpoint red spots on the skin, black, tarry stools, blood in the urine  signs of decreased red blood cells -  unusually weak or tired, feeling faint or lightheaded, falls  signs of infection - fever or chills, cough, sore throat, pain or difficulty passing urine  signs and symptoms of kidney injury like trouble passing urine or change in the amount of urine  signs and symptoms of liver injury like dark yellow or brown urine; general ill feeling or flu-like symptoms; light-colored stools; loss of appetite; nausea; right upper belly pain; unusually weak or tired; yellowing of the eyes or skin  swelling of ankles, feet, hands Side effects that usually do not require medical attention (report to your doctor or health care professional if they continue or are bothersome):  constipation  diarrhea  hair loss  loss of appetite  nausea  rash  vomiting This list may not describe all possible side effects. Call your doctor for medical advice about side effects. You may report side effects to FDA at 1-800-FDA-1088. Where should I keep my medicine? This drug is given in a hospital or clinic and will not be stored at home. NOTE: This sheet is a summary. It may not cover all possible information. If you have questions about this medicine, talk to your doctor, pharmacist, or health care provider.  2021 [employer] (2018-01-30 18:06:11)

## 2020-12-24 NOTE — Telephone Encounter (Signed)
Scheduled appointments per 2/4 los. Spoke to patient who is aware of appointments dates and times.  

## 2020-12-27 ENCOUNTER — Telehealth: Payer: Self-pay | Admitting: *Deleted

## 2020-12-29 DIAGNOSIS — E1122 Type 2 diabetes mellitus with diabetic chronic kidney disease: Secondary | ICD-10-CM | POA: Diagnosis not present

## 2020-12-31 ENCOUNTER — Inpatient Hospital Stay: Payer: Medicare HMO

## 2020-12-31 ENCOUNTER — Other Ambulatory Visit: Payer: Self-pay

## 2020-12-31 ENCOUNTER — Other Ambulatory Visit: Payer: Self-pay | Admitting: Cardiovascular Disease

## 2020-12-31 VITALS — BP 141/83 | HR 67 | Temp 98.1°F | Resp 18 | Wt 212.0 lb

## 2020-12-31 DIAGNOSIS — C221 Intrahepatic bile duct carcinoma: Secondary | ICD-10-CM

## 2020-12-31 DIAGNOSIS — Z95828 Presence of other vascular implants and grafts: Secondary | ICD-10-CM

## 2020-12-31 DIAGNOSIS — Z7189 Other specified counseling: Secondary | ICD-10-CM

## 2020-12-31 DIAGNOSIS — Z5111 Encounter for antineoplastic chemotherapy: Secondary | ICD-10-CM | POA: Diagnosis not present

## 2020-12-31 LAB — CMP (CANCER CENTER ONLY)
ALT: 42 U/L (ref 0–44)
AST: 42 U/L — ABNORMAL HIGH (ref 15–41)
Albumin: 3.7 g/dL (ref 3.5–5.0)
Alkaline Phosphatase: 62 U/L (ref 38–126)
Anion gap: 7 (ref 5–15)
BUN: 27 mg/dL — ABNORMAL HIGH (ref 8–23)
CO2: 27 mmol/L (ref 22–32)
Calcium: 9.6 mg/dL (ref 8.9–10.3)
Chloride: 103 mmol/L (ref 98–111)
Creatinine: 1.79 mg/dL — ABNORMAL HIGH (ref 0.61–1.24)
GFR, Estimated: 40 mL/min — ABNORMAL LOW (ref >60)
Glucose, Bld: 115 mg/dL — ABNORMAL HIGH (ref 70–99)
Potassium: 4.2 mmol/L (ref 3.5–5.1)
Sodium: 137 mmol/L (ref 135–145)
Total Bilirubin: 0.5 mg/dL (ref 0.3–1.2)
Total Protein: 7.3 g/dL (ref 6.5–8.1)

## 2020-12-31 LAB — CBC WITH DIFFERENTIAL (CANCER CENTER ONLY)
Abs Immature Granulocytes: 0.06 10*3/uL (ref 0.00–0.07)
Basophils Absolute: 0.1 10*3/uL (ref 0.0–0.1)
Basophils Relative: 2 %
Eosinophils Absolute: 0 10*3/uL (ref 0.0–0.5)
Eosinophils Relative: 1 %
HCT: 34.7 % — ABNORMAL LOW (ref 39.0–52.0)
Hemoglobin: 11.5 g/dL — ABNORMAL LOW (ref 13.0–17.0)
Immature Granulocytes: 1 %
Lymphocytes Relative: 25 %
Lymphs Abs: 1.1 10*3/uL (ref 0.7–4.0)
MCH: 30.4 pg (ref 26.0–34.0)
MCHC: 33.1 g/dL (ref 30.0–36.0)
MCV: 91.8 fL (ref 80.0–100.0)
Monocytes Absolute: 0.7 10*3/uL (ref 0.1–1.0)
Monocytes Relative: 16 %
Neutro Abs: 2.4 10*3/uL (ref 1.7–7.7)
Neutrophils Relative %: 55 %
Platelet Count: 153 10*3/uL (ref 150–400)
RBC: 3.78 MIL/uL — ABNORMAL LOW (ref 4.22–5.81)
RDW: 14.1 % (ref 11.5–15.5)
WBC Count: 4.3 10*3/uL (ref 4.0–10.5)
nRBC: 0.5 % — ABNORMAL HIGH (ref 0.0–0.2)

## 2020-12-31 MED ORDER — SODIUM CHLORIDE 0.9% FLUSH
10.0000 mL | INTRAVENOUS | Status: DC | PRN
  Administered 2020-12-31: 10 mL
  Filled 2020-12-31: qty 10

## 2020-12-31 MED ORDER — PROCHLORPERAZINE MALEATE 10 MG PO TABS
10.0000 mg | ORAL_TABLET | Freq: Once | ORAL | Status: AC
  Administered 2020-12-31: 10 mg via ORAL

## 2020-12-31 MED ORDER — HEPARIN SOD (PORK) LOCK FLUSH 100 UNIT/ML IV SOLN
500.0000 [IU] | Freq: Once | INTRAVENOUS | Status: AC | PRN
  Administered 2020-12-31: 500 [IU]
  Filled 2020-12-31: qty 5

## 2020-12-31 MED ORDER — SODIUM CHLORIDE 0.9 % IV SOLN
900.0000 mg/m2 | Freq: Once | INTRAVENOUS | Status: AC
  Administered 2020-12-31: 1938 mg via INTRAVENOUS
  Filled 2020-12-31: qty 50.97

## 2020-12-31 MED ORDER — PROCHLORPERAZINE MALEATE 10 MG PO TABS
ORAL_TABLET | ORAL | Status: AC
  Filled 2020-12-31: qty 1

## 2020-12-31 MED ORDER — SODIUM CHLORIDE 0.9 % IV SOLN
Freq: Once | INTRAVENOUS | Status: AC
  Filled 2020-12-31: qty 250

## 2020-12-31 MED ORDER — SODIUM CHLORIDE 0.9% FLUSH
10.0000 mL | Freq: Once | INTRAVENOUS | Status: AC
  Administered 2020-12-31: 10 mL
  Filled 2020-12-31: qty 10

## 2020-12-31 NOTE — Patient Instructions (Signed)
[facility] Discharge Instructions for Patients Receiving Chemotherapy  Today you received the following chemotherapy agents: gemcitabine.  To help prevent nausea and vomiting after your treatment, we encourage you to take your nausea medication as directed.   If you develop nausea and vomiting that is not controlled by your nausea medication, call the clinic.   BELOW ARE SYMPTOMS THAT SHOULD BE REPORTED IMMEDIATELY:  *FEVER GREATER THAN 100.5 F  *CHILLS WITH OR WITHOUT FEVER  NAUSEA AND VOMITING THAT IS NOT CONTROLLED WITH YOUR NAUSEA MEDICATION  *UNUSUAL SHORTNESS OF BREATH  *UNUSUAL BRUISING OR BLEEDING  TENDERNESS IN MOUTH AND THROAT WITH OR WITHOUT PRESENCE OF ULCERS  *URINARY PROBLEMS  *BOWEL PROBLEMS  UNUSUAL RASH Items with * indicate a potential emergency and should be followed up as soon as possible.  Feel free to call the clinic should you have any questions or concerns. The clinic phone number is [phone].  Please show the CHEMO ALERT CARD at check-in to the Emergency Department and triage nurse.   

## 2020-12-31 NOTE — Progress Notes (Signed)
Per Dr Burr Medico ok to treat with creatinine 1.79

## 2020-12-31 NOTE — Patient Instructions (Signed)

## 2021-01-12 NOTE — Progress Notes (Signed)
Brandon Sherman   [phone] [phone]   Clinic Follow up Note   Patient Care Team: [name], PA as PCP - General (Family Medicine) Lorretta Harp, MD as PCP - Cardiology (Cardiology) [name], MD as Consulting Physician (General Surgery) [name], [name], MD as Consulting Physician (Gastroenterology) [name], MD as Consulting Physician (Hematology) Lorretta Harp, MD as Consulting Physician (Cardiology)  Date of Service:  01/14/2021  CHIEF COMPLAINT: F/u cholangiocarcinoma  SUMMARY OF ONCOLOGIC HISTORY: Oncology History Overview Note  Cancer Staging Intrahepatic cholangiocarcinoma [facility]) Staging form: Intrahepatic Bile Duct, AJCC 8th Edition - Clinical stage from 08/19/2019: Stage II (cT2, cN0, cM0) - Signed by [name], MD on 08/19/2019    Intrahepatic cholangiocarcinoma ([place])  06/28/2019 Imaging   CT AP W Contrast 06/28/19  IMPRESSION: 1. Heterogeneous hypodensity posteriorly in the right hepatic lobe and potentially extending into the caudate lobe suspicious for a mass. There is felt to be truncation of branches of the portal vein in this vicinity and some narrowing of the hepatic vein, as well as triangular-shaped regions of abnormal hypoenhancement posteriorly in the right hepatic lobe likely representing downstream vascular effects. Cannot exclude malignancy such as cholangiocarcinoma or hepatocellular carcinoma, and follow up hepatic protocol MRI with and without contrast is recommended to further characterize. 2. 4 mm right middle lobe pulmonary nodule is likely benign but may merit surveillance. 3. Cholelithiasis. 4.  Aortic Atherosclerosis (ICD10-I70.0). 5. Prostatomegaly. 6. Mild impingement at L3-4 and L4-5.   07/31/2019 Imaging   MRI Liver 07/31/19 IMPRESSION: 1. 7.3 cm in long axis mass in the right hepatic lobe spanning into the caudate lobe, high suspicion for malignancy such as hepatocellular carcinoma  or cholangiocarcinoma. Suspected effacement or occlusion of the right hepatic vein and posterior branches of the right portal vein. Two smaller tumor nodules along the posterior periphery of the dominant mass. Tissue diagnosis is recommended. 2. No findings of pathologic adenopathy or distant metastatic spread. 3. 9 mm gallstone in the gallbladder. There is mild gallbladder wall thickening which may be from nondistention, correlate clinically in assessing for cholecystitis. 4.  Aortic Atherosclerosis (ICD10-I70.0). 5. Mild diffuse hepatic steatosis.   08/11/2019 Initial Biopsy   DIAGNOSIS: 08/11/19  A. LIVER, RIGHT, BIOPSY:  - Adenocarcinoma.   08/18/2019 Imaging   CT Chest 08/18/19  IMPRESSION: 1. Multiple pulmonary nodules largest at approximately 7 mm in the right lower lobe, nonspecific but concerning given findings in the liver. 2. No signs of definitive metastatic disease, also with mildly enlarged upper abdominal lymph nodes as discussed.   Aortic Atherosclerosis (ICD10-I70.0).   08/19/2019 Initial Diagnosis   Intrahepatic cholangiocarcinoma ([place])   08/19/2019 Cancer Staging   Staging form: Intrahepatic Bile Duct, AJCC 8th Edition - Clinical stage from 08/19/2019: Stage II (cT2, cN0, cM0) - Signed by [name], MD on 08/19/2019   09/29/2019 - 07/19/2020 Chemotherapy   Cisplatin and Gemcitabine 2 weeks on/1 week off starting 09/29/19. Cisplatin held from cycle 9 (03/23/20) due to fluid status/Afib. He is now on maintenance Gemcitabine. Held after 07/19/20 to proceed with liver target therapy.     10/09/2019 Imaging   CT AP IMPRESSION: 1. The dominant right hepatic lobe mass is minimally reduced in size compared to prior exams, currently measuring 6.2 by 5.3 cm, previously 6.2 by 5.6 cm. However, there is a new small hypodense lesion centrally in the right hepatic lobe which is suspicious for a new small focus of tumor. Accordingly this is an overall mixed appearance. 2.  Continued  hypoenhancement in the liver downstream of the tumor likely attributable to narrowing or occlusion of the right hepatic vein by the tumor. By virtue of its location the tumor wraps around the intrahepatic portion of the IVC. 3. 4 mm right middle lobe pulmonary nodule, stable compared to earliest available comparison of 07/18/2019. Surveillance of the patient's pulmonary nodules is recommended. 4. Other imaging findings of potential clinical significance: Coronary atherosclerosis. Cholelithiasis. Prominent stool throughout the colon favors constipation. Moderate prostatomegaly with heterogeneous enhancement of the prostate gland. Lumbar spondylosis and degenerative disc disease causing mild bilateral foraminal impingement at L3-4 and L4-5.   Aortic Atherosclerosis (ICD10-I70.0).   12/24/2019 Imaging   CT CAP W Contrast  IMPRESSION: 1. Right hepatic lobe mass and adjacent right hepatic lobe nodules appear grossly stable. No evidence of distant metastatic disease. 2. Continued stability of small pulmonary nodules. Recommend attention on follow-up. 3. Cholelithiasis. 4. Enlarged prostate. 5. Aortic atherosclerosis (ICD10-I70.0). Coronary artery calcification.   02/23/2020 Procedure   He had PAC placed on 02/23/20.    04/08/2020 Imaging   CT CAP  IMPRESSION: 1. Stable or minimally decreased size of a very ill-defined, hypodense and somewhat retractile appearing mass of the central right lobe of the liver abutting the inferior vena cava and right portal vein, measuring approximately 5.7 x 5.0 cm, previously 6.2 x 5.2 cm when measured similarly. Findings are consistent with stable or minimally improved cholangiocarcinoma. 2. Small hypodense nodules of the right lobe identified on prior examination are poorly appreciated on this single phase contrast examination although not grossly changed. Attention on follow-up. 3. There are new, moderate bilateral pleural effusions and associated atelectasis  or consolidation as well as a new small pericardial effusion, nonspecific although generally concerning and suspicious for malignant effusions. There is no directly visualized pleural nodularity. 4. Multiple small pulmonary nodules are stable.  No new nodules. 5. Coronary artery disease. Aortic Atherosclerosis (ICD10-I70.0).   07/06/2020 Imaging   MRI ABDIMPRESSION: 1. Interval decrease in size of the right hepatic lobe lesion in the medial aspect of the segment 7. Findings would suggest a good response to treatment with some contraction of the tumor. 2. No new hepatic lesions. No abdominal adenopathy or metastatic disease. 3. Stable mild intrahepatic biliary dilatation in the right hepatic lobe distal to the lesion. 4. Somewhat tortuous and almost beaded appearance of the hepatic and portal vein radicles. Findings could be radiation related. 5. Cholelithiasis.  CT chest wo contrast IMPRESSION: 1. Persistent/stable moderate-sized bilateral pleural effusions with overlying atelectasis. 2. Stable small right pulmonary nodules. No new or progressive findings. Recommend continued surveillance. 3. No mediastinal or hilar mass or adenopathy. 4. Stable advanced three-vessel coronary artery calcifications. 5. Cholelithiasis. Aortic Atherosclerosis (ICD10-I70.0)   08/11/2020 Procedure   Y90 on 08/11/20 and 08/26/20 with Dr Laurence Ferrari    11/30/2020 Imaging   MRI Abdomen  IMPRESSION: 1. Today's study demonstrates progression of disease with enlarging central lesion in the right lobe of the liver involving portions of segments 7 and 8, now with evidence of some tumor thrombus extension into the intrahepatic portion of the inferior vena cava. This is associated with increasing intrahepatic biliary ductal dilatation in segment 7 of the liver. 2. In addition, there is some subtle hyperenhancement of the distal common bile duct at and immediately proximally to the level of the ampulla. There is  also some subtle delayed enhancement around this region in the pancreatic head. This is of uncertain etiology and significance, but may be inflammatory and currently is  not associated with proximal common bile duct dilatation. However, close attention on follow-up imaging is recommended. 3. Cholelithiasis without evidence of acute cholecystitis at this time. 4. Aortic atherosclerosis.     12/24/2020 -  Chemotherapy   Restart Gemcitabine 2 weeks on/1 week off given disease progression beginning 12/24/20        CURRENT THERAPY:  Restarted Gemcitabine 2 weeks on/1 week off starting 12/24/20  INTERVAL HISTORY:  Brandon Sherman is here for a follow up. He presents to the clinic with his wife. He notes he tolerated last cycle chemo well. He notes he has adequate appetite. He has gained 10 pounds since last visit and know he needs to reduce. He notes he will get some abdominal pain if he pulls or strains his muscle. He is able to be active at home and with exercise. I reviewed his medications list with him. He is on Lasix BID. He is on Janumet for his feet, by PCP.     REVIEW OF SYSTEMS:   Constitutional: Denies fevers, chills or abnormal weight loss Eyes: Denies blurriness of vision Ears, nose, mouth, throat, and face: Denies mucositis or sore throat Respiratory: Denies cough, dyspnea or wheezes Cardiovascular: Denies palpitation, chest discomfort or lower extremity swelling Gastrointestinal:  Denies nausea, heartburn or change in bowel habits Skin: Denies abnormal skin rashes Lymphatics: Denies new lymphadenopathy or easy bruising Neurological:Denies numbness, tingling or new weaknesses Behavioral/Psych: Mood is stable, no new changes  All other systems were reviewed with the patient and are negative.  MEDICAL HISTORY:  Past Medical History:  Diagnosis Date  . Arthritis   . Diabetes ([place])   . GERD (gastroesophageal reflux disease)   . Hyperlipidemia   . Hypertension   .  Intrahepatic cholangiocarcinoma ([place])     SURGICAL HISTORY: Past Surgical History:  Procedure Laterality Date  . APPENDECTOMY  1980  . CARDIOVERSION N/A 04/27/2020   Procedure: CARDIOVERSION;  Surgeon: Dorothy Spark, MD;  Location: [facility] ENDOSCOPY;  Service: Cardiovascular;  Laterality: N/A;  . CARDIOVERSION N/A 05/19/2020   Procedure: CARDIOVERSION;  Surgeon: Elouise Munroe, MD;  Location: [facility] ENDOSCOPY;  Service: Cardiovascular;  Laterality: N/A;  . COLONOSCOPY    . IR 3D INDEPENDENT WKST  08/11/2020  . IR 3D INDEPENDENT WKST  08/11/2020  . IR ANGIOGRAM SELECTIVE EACH ADDITIONAL VESSEL  08/11/2020  . IR ANGIOGRAM SELECTIVE EACH ADDITIONAL VESSEL  08/11/2020  . IR ANGIOGRAM SELECTIVE EACH ADDITIONAL VESSEL  08/11/2020  . IR ANGIOGRAM SELECTIVE EACH ADDITIONAL VESSEL  08/11/2020  . IR ANGIOGRAM SELECTIVE EACH ADDITIONAL VESSEL  08/11/2020  . IR ANGIOGRAM SELECTIVE EACH ADDITIONAL VESSEL  08/11/2020  . IR ANGIOGRAM SELECTIVE EACH ADDITIONAL VESSEL  08/26/2020  . IR ANGIOGRAM SELECTIVE EACH ADDITIONAL VESSEL  08/26/2020  . IR ANGIOGRAM VISCERAL SELECTIVE  08/11/2020  . IR ANGIOGRAM VISCERAL SELECTIVE  08/26/2020  . IR EMBO ARTERIAL NOT HEMORR HEMANG INC GUIDE ROADMAPPING  08/11/2020  . IR EMBO TUMOR ORGAN ISCHEMIA INFARCT INC GUIDE ROADMAPPING  08/26/2020  . IR IMAGING GUIDED PORT INSERTION  02/23/2020  . IR RADIOLOGIST EVAL & MGMT  07/14/2020  . IR RADIOLOGIST EVAL & MGMT  09/30/2020  . IR RADIOLOGIST EVAL & MGMT  12/01/2020  . IR US GUIDE VASC ACCESS LEFT  08/26/2020  . IR US GUIDE VASC ACCESS RIGHT  08/11/2020    I have reviewed the social history and family history with the patient and they are unchanged from previous note.  ALLERGIES:  has No Known Allergies.  MEDICATIONS:  Current Outpatient Medications  Medication Sig Dispense Refill  . allopurinol (ZYLOPRIM) 100 MG tablet Take 100 mg by mouth daily.    Marland Kitchen apixaban (ELIQUIS) 5 MG TABS tablet Take 1 tablet (5 mg total) by mouth 2 (two)  times daily. 180 tablet 3  . colchicine 0.6 MG tablet Take 1 tablet (0.6 mg total) by mouth daily as needed (gout flare). Take daily for 3 days, then as needed for gout flare 30 tablet 0  . diltiazem (CARDIZEM CD) 240 MG 24 hr capsule Take 1 capsule (240 mg total) by mouth daily. 90 capsule 3  . fenofibrate (TRICOR) 145 MG tablet Take 145 mg by mouth daily.    . finasteride (PROSCAR) 5 MG tablet Take 5 mg by mouth daily.    . furosemide (LASIX) 40 MG tablet Take 40 mg twice a day 90 tablet 3  . hydrALAZINE (APRESOLINE) 50 MG tablet TAKE 2 TABLETS BY MOUTH IN THE MORNING, 1 TABLET AT LUNCH AND 2 IN THE EVENING 450 tablet 1  . KLOR-CON M20 20 MEQ tablet TAKE 1 TABLET BY MOUTH TWICE A DAY 60 tablet 1  . KLOR-CON M20 20 MEQ tablet TAKE 1 TABLET BY MOUTH TWICE A DAY 180 tablet 1  . lidocaine-prilocaine (EMLA) cream Apply 1 application topically as needed. 30 g 3  . magnesium oxide (MAG-OX) 400 MG tablet Take 1 tablet (400 mg total) by mouth daily. 30 tablet 0  . metoprolol succinate (TOPROL XL) 25 MG 24 hr tablet Take 1 tablet (25 mg total) by mouth daily. 90 tablet 3  . ondansetron (ZOFRAN) 8 MG tablet Take 1 tablet (8 mg total) by mouth every 8 (eight) hours as needed for nausea. Start on the third day after chemotherapy. 30 tablet 1  . pantoprazole (PROTONIX) 40 MG tablet TAKE 1 TABLET BY MOUTH TWICE A DAY 180 tablet 0  . pravastatin (PRAVACHOL) 40 MG tablet Take 40 mg by mouth daily.    . sucralfate (CARAFATE) 1 g tablet Take 1 tablet (1 g total) by mouth every 6 (six) hours as needed. Please schedule a yearly follow up for further refills: [phone] 60 tablet 0  . tamsulosin (FLOMAX) 0.4 MG CAPS capsule Take 0.4 mg by mouth daily.    . Vitamin D, Ergocalciferol, (DRISDOL) 1.25 MG (50000 UT) CAPS capsule Take 50,000 Units by mouth every 7 (seven) days.     No current facility-administered medications for this visit.    PHYSICAL EXAMINATION: ECOG PERFORMANCE STATUS: 0 -  Asymptomatic  Vitals:   01/14/21 1136  BP: 130/65  Pulse: 75  Resp: 17  Temp: (!) 96.2 F (35.7 C)  SpO2: 100%   Filed Weights   01/14/21 1136  Weight: 223 lb 1.6 oz (101.2 kg)    GENERAL:alert, no distress and comfortable SKIN: skin color, texture, turgor are normal, no rashes or significant lesions EYES: normal, Conjunctiva are pink and non-injected, sclera clear  Musculoskeletal:no cyanosis of digits and no clubbing  NEURO: alert & oriented x 3 with fluent speech, no focal motor/sensory deficits  LABORATORY DATA:  I have reviewed the data as listed CBC Latest Ref Rng & Units 01/14/2021 12/31/2020 12/24/2020  WBC 4.0 - 10.5 K/uL 5.9 4.3 6.5  Hemoglobin 13.0 - 17.0 g/dL 11.7(L) 11.5(L) 12.8(L)  Hematocrit 39.0 - 52.0 % 35.2(L) 34.7(L) 39.2  Platelets 150 - 400 K/uL 308 153 237     CMP Latest Ref Rng & Units 12/31/2020 12/24/2020 11/26/2020  Glucose 70 - 99 mg/dL 115(H) 117(H) 118(H)  BUN  8 - 23 mg/dL 27(H) 28(H) 28(H)  Creatinine 0.61 - 1.24 mg/dL 1.79(H) 1.81(H) 1.96(H)  Sodium 135 - 145 mmol/L 137 136 136  Potassium 3.5 - 5.1 mmol/L 4.2 4.1 4.1  Chloride 98 - 111 mmol/L 103 103 103  CO2 22 - 32 mmol/L $RemoveB'27 26 24  'KjUAUWzG$ Calcium 8.9 - 10.3 mg/dL 9.6 9.7 9.9  Total Protein 6.5 - 8.1 g/dL 7.3 7.1 7.4  Total Bilirubin 0.3 - 1.2 mg/dL 0.5 0.5 0.6  Alkaline Phos 38 - 126 U/L 62 47 48  AST 15 - 41 U/L 42(H) 26 24  ALT 0 - 44 U/L 42 18 13      RADIOGRAPHIC STUDIES: I have personally reviewed the radiological images as listed and agreed with the findings in the report. No results found.   ASSESSMENT & PLAN:  Brandon Sherman is a 75 y.o. male with   1.Intrahepatic cholangiocarcinoma, cT2N0Mx,unresectable,with indeterminate lung nodules -He was diagnosed in 07/2019.CT scans and MRI liver showa large7.3cm mass in the right hepatic lobe whichabutsportal vein.  -He was seen byour local surgeon Dr. Barry Dienes andDr Carlis Abbott at [facility] concluded that cancer is not resectable due to  the invasion to portal vein.Given cancer is non-resectable his cancer is likely not curable but still treatable. -He was treated with standardfirst line chemo with IV Cisplatin and Gemcitabine 2 weeks on/1 weekoff beginning 09/29/19.Cisplatin was held since C9 (03/23/20) due to fluid status/AFib. Hecontinued maintenanceGemcitabineuntil 07/19/20. He proceeded with Y90 on 08/11/20 and 08/26/20.  -His MRI Abdomen from 12/11/20 showed progression of diseasein liver with tumor thrombus extension into IVC.I discussed with disease progression, Dr Geroge Baseman does not recommend more Y90 or liver target therapy at this point.  -To control his disease I restarted Single agent Gemcitabine 2 weeks on/1 week off beginning 12/24/20. Plan to scan him in 3 months  -He has IDH-1 mutation, which makes him eligible for an [place] inhibitordrugIvosidenib. I would recommend this in later lines of treatment.  -He is currently tolerated restart of treatment well. Labs reviewed, Hg 11.7. CMP still pending. Overall adequate to proceed with C16D1 Gemcitabine today at same dose. Proceed with day 8 next week.  -f/u in 3 weeks   2. DM, HTN, HLD, Gout, LE edema -On Metformin, amlodipine, lisinopril., allopurinol, Lasix.Continue to f/u with his PCPand cardiologist. -His PCP has started him on Janumet for his feet burning.   3.AF andCHF -With PAC placement on 02/23/20 he was found to have Afib. Based on [place].He is at moderate risk for stroke. -HeunderwentCardioversion with Dr. Meda Coffee on 04/27/20. After [facility] late 6/2021he had another cardioversion on 05/19/20. He has recovered well. -His Afib is intermittent. He will continue his medications and f/u with cardiologist.  -he is on Eliquis   4. Nicotine Use -He never smoked but has been chewing Tobacco for the past 60 years.He no longer drinks alcohol. -Hecurrentlychews 1-2 times a day.Iagainencouraged him toreduce and quit  completely.  5.GERDand gastritis, history ofesophageal candidiasis -Hehad repeated EGDwith Dr. Havery Moros in 07/2019. 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  -Will monitor andmayrepeat EGDin future   PLAN: -Labs reviewed, adequate to proceed with C16D1 Gemcitabine today at same dose if no worsening renal or liver function, CMP pending.  -Lab, flush, Gemcitabine in 1, 3, 4 weeks  -F/u in 3 weeks    No problem-specific Assessment & Plan notes found for this encounter.   No orders of the defined types were placed in  this encounter.  All questions were answered. The patient knows to call the clinic with any problems, questions or concerns. No barriers to learning was detected. The total time spent in the appointment was 30 minutes.     [name], MD 01/14/2021   I, Fonnie Birkenhead, am acting as scribe for [name], MD.   I have reviewed the above documentation for accuracy and completeness, and I agree with the above.

## 2021-01-14 ENCOUNTER — Telehealth: Payer: Self-pay | Admitting: Hematology

## 2021-01-14 ENCOUNTER — Inpatient Hospital Stay: Payer: Medicare HMO

## 2021-01-14 ENCOUNTER — Encounter: Payer: Self-pay | Admitting: Hematology

## 2021-01-14 ENCOUNTER — Inpatient Hospital Stay: Payer: Medicare HMO | Admitting: Hematology

## 2021-01-14 ENCOUNTER — Other Ambulatory Visit: Payer: Self-pay

## 2021-01-14 VITALS — BP 130/65 | HR 75 | Temp 96.2°F | Resp 17 | Ht 69.0 in | Wt 223.1 lb

## 2021-01-14 DIAGNOSIS — C221 Intrahepatic bile duct carcinoma: Secondary | ICD-10-CM

## 2021-01-14 DIAGNOSIS — Z7189 Other specified counseling: Secondary | ICD-10-CM

## 2021-01-14 DIAGNOSIS — Z95828 Presence of other vascular implants and grafts: Secondary | ICD-10-CM

## 2021-01-14 DIAGNOSIS — Z5111 Encounter for antineoplastic chemotherapy: Secondary | ICD-10-CM | POA: Diagnosis not present

## 2021-01-14 LAB — CMP (CANCER CENTER ONLY)
ALT: 22 U/L (ref 0–44)
AST: 26 U/L (ref 15–41)
Albumin: 3.7 g/dL (ref 3.5–5.0)
Alkaline Phosphatase: 60 U/L (ref 38–126)
Anion gap: 7 (ref 5–15)
BUN: 29 mg/dL — ABNORMAL HIGH (ref 8–23)
CO2: 25 mmol/L (ref 22–32)
Calcium: 9.5 mg/dL (ref 8.9–10.3)
Chloride: 103 mmol/L (ref 98–111)
Creatinine: 1.79 mg/dL — ABNORMAL HIGH (ref 0.61–1.24)
GFR, Estimated: 40 mL/min — ABNORMAL LOW (ref >60)
Glucose, Bld: 139 mg/dL — ABNORMAL HIGH (ref 70–99)
Potassium: 4.2 mmol/L (ref 3.5–5.1)
Sodium: 135 mmol/L (ref 135–145)
Total Bilirubin: 0.4 mg/dL (ref 0.3–1.2)
Total Protein: 7.2 g/dL (ref 6.5–8.1)

## 2021-01-14 LAB — CBC WITH DIFFERENTIAL (CANCER CENTER ONLY)
Abs Immature Granulocytes: 0.03 10*3/uL (ref 0.00–0.07)
Basophils Absolute: 0.1 10*3/uL (ref 0.0–0.1)
Basophils Relative: 1 %
Eosinophils Absolute: 0.1 10*3/uL (ref 0.0–0.5)
Eosinophils Relative: 1 %
HCT: 35.2 % — ABNORMAL LOW (ref 39.0–52.0)
Hemoglobin: 11.7 g/dL — ABNORMAL LOW (ref 13.0–17.0)
Immature Granulocytes: 1 %
Lymphocytes Relative: 16 %
Lymphs Abs: 0.9 10*3/uL (ref 0.7–4.0)
MCH: 31.5 pg (ref 26.0–34.0)
MCHC: 33.2 g/dL (ref 30.0–36.0)
MCV: 94.9 fL (ref 80.0–100.0)
Monocytes Absolute: 0.6 10*3/uL (ref 0.1–1.0)
Monocytes Relative: 11 %
Neutro Abs: 4.2 10*3/uL (ref 1.7–7.7)
Neutrophils Relative %: 70 %
Platelet Count: 308 10*3/uL (ref 150–400)
RBC: 3.71 MIL/uL — ABNORMAL LOW (ref 4.22–5.81)
RDW: 16.8 % — ABNORMAL HIGH (ref 11.5–15.5)
WBC Count: 5.9 10*3/uL (ref 4.0–10.5)
nRBC: 0 % (ref 0.0–0.2)

## 2021-01-14 MED ORDER — SODIUM CHLORIDE 0.9% FLUSH
10.0000 mL | Freq: Once | INTRAVENOUS | Status: AC
  Administered 2021-01-14: 10 mL
  Filled 2021-01-14: qty 10

## 2021-01-14 MED ORDER — SODIUM CHLORIDE 0.9% FLUSH
10.0000 mL | INTRAVENOUS | Status: DC | PRN
  Administered 2021-01-14: 10 mL
  Filled 2021-01-14: qty 10

## 2021-01-14 MED ORDER — HEPARIN SOD (PORK) LOCK FLUSH 100 UNIT/ML IV SOLN
500.0000 [IU] | Freq: Once | INTRAVENOUS | Status: AC | PRN
  Administered 2021-01-14: 500 [IU]
  Filled 2021-01-14: qty 5

## 2021-01-14 MED ORDER — SODIUM CHLORIDE 0.9 % IV SOLN
900.0000 mg/m2 | Freq: Once | INTRAVENOUS | Status: AC
  Administered 2021-01-14: 1938 mg via INTRAVENOUS
  Filled 2021-01-14: qty 50.97

## 2021-01-14 MED ORDER — SODIUM CHLORIDE 0.9 % IV SOLN
Freq: Once | INTRAVENOUS | Status: AC
Start: 2021-01-14 — End: 2021-01-14
  Filled 2021-01-14: qty 250

## 2021-01-14 MED ORDER — PROCHLORPERAZINE MALEATE 10 MG PO TABS
ORAL_TABLET | ORAL | Status: AC
  Filled 2021-01-14: qty 1

## 2021-01-14 MED ORDER — PROCHLORPERAZINE MALEATE 10 MG PO TABS
10.0000 mg | ORAL_TABLET | Freq: Once | ORAL | Status: AC
  Administered 2021-01-14: 10 mg via ORAL

## 2021-01-14 NOTE — Patient Instructions (Signed)
[facility] Discharge Instructions for Patients Receiving Chemotherapy  Today you received the following chemotherapy agents Gemcitabine.  To help prevent nausea and vomiting after your treatment, we encourage you to take your nausea medication as directed.  If you develop nausea and vomiting that is not controlled by your nausea medication, call the clinic.   BELOW ARE SYMPTOMS THAT SHOULD BE REPORTED IMMEDIATELY:  *FEVER GREATER THAN 100.5 F  *CHILLS WITH OR WITHOUT FEVER  NAUSEA AND VOMITING THAT IS NOT CONTROLLED WITH YOUR NAUSEA MEDICATION  *UNUSUAL SHORTNESS OF BREATH  *UNUSUAL BRUISING OR BLEEDING  TENDERNESS IN MOUTH AND THROAT WITH OR WITHOUT PRESENCE OF ULCERS  *URINARY PROBLEMS  *BOWEL PROBLEMS  UNUSUAL RASH Items with * indicate a potential emergency and should be followed up as soon as possible.  Feel free to call the clinic should you have any questions or concerns. The clinic phone number is [phone].  Please show the CHEMO ALERT CARD at check-in to the Emergency Department and triage nurse.   

## 2021-01-14 NOTE — Telephone Encounter (Signed)
Left message with follow-up appointments per 2/25 los. Gave option to call back to reschedule if needed.

## 2021-01-14 NOTE — Patient Instructions (Signed)
Implanted Port Insertion, Care After This sheet gives you information about how to care for yourself after your procedure. Your health care provider may also give you more specific instructions. If you have problems or questions, contact your health care provider. What can I expect after the procedure? After the procedure, it is common to have:  Discomfort at the port insertion site.  Bruising on the skin over the port. This should improve over 3-4 days. Follow these instructions at home: Port care  After your port is placed, you will get a manufacturer's information card. The card has information about your port. Keep this card with you at all times.  Take care of the port as told by your health care provider. Ask your health care provider if you or a family member can get training for taking care of the port at home. A home health care nurse may also take care of the port.  Make sure to remember what type of port you have. Incision care  Follow instructions from your health care provider about how to take care of your port insertion site. Make sure you: ? Wash your hands with soap and water before and after you change your bandage (dressing). If soap and water are not available, use hand sanitizer. ? Change your dressing as told by your health care provider. ? Leave stitches (sutures), skin glue, or adhesive strips in place. These skin closures may need to stay in place for 2 weeks or longer. If adhesive strip edges start to loosen and curl up, you may trim the loose edges. Do not remove adhesive strips completely unless your health care provider tells you to do that.  Check your port insertion site every day for signs of infection. Check for: ? Redness, swelling, or pain. ? Fluid or blood. ? Warmth. ? Pus or a bad smell.      Activity  Return to your normal activities as told by your health care provider. Ask your health care provider what activities are safe for you.  Do not  lift anything that is heavier than 10 lb (4.5 kg), or the limit that you are told, until your health care provider says that it is safe. General instructions  Take over-the-counter and prescription medicines only as told by your health care provider.  Do not take baths, swim, or use a hot tub until your health care provider approves. Ask your health care provider if you may take showers. You may only be allowed to take sponge baths.  Do not drive for 24 hours if you were given a sedative during your procedure.  Wear a medical alert bracelet in case of an emergency. This will tell any health care providers that you have a port.  Keep all follow-up visits as told by your health care provider. This is important. Contact a health care provider if:  You cannot flush your port with saline as directed, or you cannot draw blood from the port.  You have a fever or chills.  You have redness, swelling, or pain around your port insertion site.  You have fluid or blood coming from your port insertion site.  Your port insertion site feels warm to the touch.  You have pus or a bad smell coming from the port insertion site. Get help right away if:  You have chest pain or shortness of breath.  You have bleeding from your port that you cannot control. Summary  Take care of the port as told by your   health care provider. Keep the manufacturer's information card with you at all times.  Change your dressing as told by your health care provider.  Contact a health care provider if you have a fever or chills or if you have redness, swelling, or pain around your port insertion site.  Keep all follow-up visits as told by your health care provider. This information is not intended to replace advice given to you by your health care provider. Make sure you discuss any questions you have with your health care provider. Document Revised: 06/04/2018 Document Reviewed: 06/04/2018 Elsevier Patient Education   2021 [employer]vier Inc.  

## 2021-01-14 NOTE — Progress Notes (Signed)
MD made aware of labs, states okay to treat

## 2021-01-14 NOTE — Addendum Note (Signed)
Addended by: [name] on: 01/14/2021 12:13 PM   Modules accepted: Orders

## 2021-01-21 ENCOUNTER — Inpatient Hospital Stay: Payer: Medicare HMO

## 2021-01-21 ENCOUNTER — Inpatient Hospital Stay: Payer: Medicare HMO | Attending: Hematology

## 2021-01-21 ENCOUNTER — Other Ambulatory Visit: Payer: Self-pay

## 2021-01-21 VITALS — BP 117/60 | HR 73 | Temp 99.3°F | Resp 17

## 2021-01-21 DIAGNOSIS — C221 Intrahepatic bile duct carcinoma: Secondary | ICD-10-CM | POA: Insufficient documentation

## 2021-01-21 DIAGNOSIS — Z7189 Other specified counseling: Secondary | ICD-10-CM

## 2021-01-21 DIAGNOSIS — Z95828 Presence of other vascular implants and grafts: Secondary | ICD-10-CM

## 2021-01-21 DIAGNOSIS — Z5111 Encounter for antineoplastic chemotherapy: Secondary | ICD-10-CM | POA: Insufficient documentation

## 2021-01-21 LAB — CBC WITH DIFFERENTIAL (CANCER CENTER ONLY)
Abs Immature Granulocytes: 0.32 10*3/uL — ABNORMAL HIGH (ref 0.00–0.07)
Basophils Absolute: 0.1 10*3/uL (ref 0.0–0.1)
Basophils Relative: 2 %
Eosinophils Absolute: 0 10*3/uL (ref 0.0–0.5)
Eosinophils Relative: 0 %
HCT: 34.5 % — ABNORMAL LOW (ref 39.0–52.0)
Hemoglobin: 11.2 g/dL — ABNORMAL LOW (ref 13.0–17.0)
Immature Granulocytes: 10 %
Lymphocytes Relative: 22 %
Lymphs Abs: 0.7 10*3/uL (ref 0.7–4.0)
MCH: 31.5 pg (ref 26.0–34.0)
MCHC: 32.5 g/dL (ref 30.0–36.0)
MCV: 96.9 fL (ref 80.0–100.0)
Monocytes Absolute: 0.7 10*3/uL (ref 0.1–1.0)
Monocytes Relative: 23 %
Neutro Abs: 1.4 10*3/uL — ABNORMAL LOW (ref 1.7–7.7)
Neutrophils Relative %: 43 %
Platelet Count: 250 10*3/uL (ref 150–400)
RBC: 3.56 MIL/uL — ABNORMAL LOW (ref 4.22–5.81)
RDW: 16.1 % — ABNORMAL HIGH (ref 11.5–15.5)
WBC Count: 3.2 10*3/uL — ABNORMAL LOW (ref 4.0–10.5)
nRBC: 3.1 % — ABNORMAL HIGH (ref 0.0–0.2)

## 2021-01-21 LAB — CMP (CANCER CENTER ONLY)
ALT: 42 U/L (ref 0–44)
AST: 39 U/L (ref 15–41)
Albumin: 3.4 g/dL — ABNORMAL LOW (ref 3.5–5.0)
Alkaline Phosphatase: 70 U/L (ref 38–126)
Anion gap: 10 (ref 5–15)
BUN: 19 mg/dL (ref 8–23)
CO2: 24 mmol/L (ref 22–32)
Calcium: 9.6 mg/dL (ref 8.9–10.3)
Chloride: 102 mmol/L (ref 98–111)
Creatinine: 1.71 mg/dL — ABNORMAL HIGH (ref 0.61–1.24)
GFR, Estimated: 42 mL/min — ABNORMAL LOW (ref >60)
Glucose, Bld: 214 mg/dL — ABNORMAL HIGH (ref 70–99)
Potassium: 4.1 mmol/L (ref 3.5–5.1)
Sodium: 136 mmol/L (ref 135–145)
Total Bilirubin: 0.4 mg/dL (ref 0.3–1.2)
Total Protein: 7 g/dL (ref 6.5–8.1)

## 2021-01-21 MED ORDER — SODIUM CHLORIDE 0.9 % IV SOLN
Freq: Once | INTRAVENOUS | Status: AC
  Filled 2021-01-21: qty 250

## 2021-01-21 MED ORDER — HEPARIN SOD (PORK) LOCK FLUSH 100 UNIT/ML IV SOLN
500.0000 [IU] | Freq: Once | INTRAVENOUS | Status: AC | PRN
  Administered 2021-01-21: 500 [IU]
  Filled 2021-01-21: qty 5

## 2021-01-21 MED ORDER — SODIUM CHLORIDE 0.9% FLUSH
10.0000 mL | Freq: Once | INTRAVENOUS | Status: AC
  Administered 2021-01-21: 10 mL
  Filled 2021-01-21: qty 10

## 2021-01-21 MED ORDER — SODIUM CHLORIDE 0.9 % IV SOLN
900.0000 mg/m2 | Freq: Once | INTRAVENOUS | Status: AC
  Administered 2021-01-21: 1938 mg via INTRAVENOUS
  Filled 2021-01-21: qty 50.97

## 2021-01-21 MED ORDER — SODIUM CHLORIDE 0.9% FLUSH
10.0000 mL | INTRAVENOUS | Status: DC | PRN
  Administered 2021-01-21: 10 mL
  Filled 2021-01-21: qty 10

## 2021-01-21 MED ORDER — PROCHLORPERAZINE MALEATE 10 MG PO TABS
ORAL_TABLET | ORAL | Status: AC
  Filled 2021-01-21: qty 1

## 2021-01-21 MED ORDER — PROCHLORPERAZINE MALEATE 10 MG PO TABS
10.0000 mg | ORAL_TABLET | Freq: Once | ORAL | Status: AC
  Administered 2021-01-21: 10 mg via ORAL

## 2021-01-21 NOTE — Patient Instructions (Signed)
[facility] Discharge Instructions for Patients Receiving Chemotherapy  Today you received the following chemotherapy agents Gemcitabine.  To help prevent nausea and vomiting after your treatment, we encourage you to take your nausea medication as directed.  If you develop nausea and vomiting that is not controlled by your nausea medication, call the clinic.   BELOW ARE SYMPTOMS THAT SHOULD BE REPORTED IMMEDIATELY:  *FEVER GREATER THAN 100.5 F  *CHILLS WITH OR WITHOUT FEVER  NAUSEA AND VOMITING THAT IS NOT CONTROLLED WITH YOUR NAUSEA MEDICATION  *UNUSUAL SHORTNESS OF BREATH  *UNUSUAL BRUISING OR BLEEDING  TENDERNESS IN MOUTH AND THROAT WITH OR WITHOUT PRESENCE OF ULCERS  *URINARY PROBLEMS  *BOWEL PROBLEMS  UNUSUAL RASH Items with * indicate a potential emergency and should be followed up as soon as possible.  Feel free to call the clinic should you have any questions or concerns. The clinic phone number is [phone].  Please show the CHEMO ALERT CARD at check-in to the Emergency Department and triage nurse.   

## 2021-01-21 NOTE — Progress Notes (Signed)
Ok to treat with Scr and ANC per Dr. Burr Medico.

## 2021-01-21 NOTE — Patient Instructions (Signed)

## 2021-01-25 DIAGNOSIS — J0100 Acute maxillary sinusitis, unspecified: Secondary | ICD-10-CM | POA: Diagnosis not present

## 2021-01-26 ENCOUNTER — Other Ambulatory Visit: Payer: Self-pay | Admitting: Hematology

## 2021-01-26 ENCOUNTER — Other Ambulatory Visit: Payer: Self-pay | Admitting: Nurse Practitioner

## 2021-01-26 DIAGNOSIS — Z7189 Other specified counseling: Secondary | ICD-10-CM

## 2021-01-26 DIAGNOSIS — C221 Intrahepatic bile duct carcinoma: Secondary | ICD-10-CM

## 2021-02-02 NOTE — Progress Notes (Signed)
[place]   Telephone:([phone]) [phone] Fax:(336) [phone]   Clinic Follow up Note   Patient Care Team: [name], PA as PCP - General (Family Medicine) Lorretta Harp, MD as PCP - Cardiology (Cardiology) [name], MD as Consulting Physician (General Surgery) [name], [name], MD as Consulting Physician (Gastroenterology) [name], MD as Consulting Physician (Hematology) Lorretta Harp, MD as Consulting Physician (Cardiology)  Date of Service:  02/04/2021  CHIEF COMPLAINT:  F/u cholangiocarcinoma  SUMMARY OF ONCOLOGIC HISTORY: Oncology History Overview Note  Cancer Staging Intrahepatic cholangiocarcinoma ([place]) Staging form: Intrahepatic Bile Duct, AJCC 8th Edition - Clinical stage from 08/19/2019: Stage II (cT2, cN0, cM0) - Signed by [name], MD on 08/19/2019    Intrahepatic cholangiocarcinoma ([place])  06/28/2019 Imaging   CT AP W Contrast 06/28/19  IMPRESSION: 1. Heterogeneous hypodensity posteriorly in the right hepatic lobe and potentially extending into the caudate lobe suspicious for a mass. There is felt to be truncation of branches of the portal vein in this vicinity and some narrowing of the hepatic vein, as well as triangular-shaped regions of abnormal hypoenhancement posteriorly in the right hepatic lobe likely representing downstream vascular effects. Cannot exclude malignancy such as cholangiocarcinoma or hepatocellular carcinoma, and follow up hepatic protocol MRI with and without contrast is recommended to further characterize. 2. 4 mm right middle lobe pulmonary nodule is likely benign but may merit surveillance. 3. Cholelithiasis. 4.  Aortic Atherosclerosis (ICD10-I70.0). 5. Prostatomegaly. 6. Mild impingement at L3-4 and L4-5.   07/31/2019 Imaging   MRI Liver 07/31/19 IMPRESSION: 1. 7.3 cm in long axis mass in the right hepatic lobe spanning into the caudate lobe, high suspicion for malignancy such as hepatocellular  carcinoma or cholangiocarcinoma. Suspected effacement or occlusion of the right hepatic vein and posterior branches of the right portal vein. Two smaller tumor nodules along the posterior periphery of the dominant mass. Tissue diagnosis is recommended. 2. No findings of pathologic adenopathy or distant metastatic spread. 3. 9 mm gallstone in the gallbladder. There is mild gallbladder wall thickening which may be from nondistention, correlate clinically in assessing for cholecystitis. 4.  Aortic Atherosclerosis (ICD10-I70.0). 5. Mild diffuse hepatic steatosis.   08/11/2019 Initial Biopsy   DIAGNOSIS: 08/11/19  A. LIVER, RIGHT, BIOPSY:  - Adenocarcinoma.   08/18/2019 Imaging   CT Chest 08/18/19  IMPRESSION: 1. Multiple pulmonary nodules largest at approximately 7 mm in the right lower lobe, nonspecific but concerning given findings in the liver. 2. No signs of definitive metastatic disease, also with mildly enlarged upper abdominal lymph nodes as discussed.   Aortic Atherosclerosis (ICD10-I70.0).   08/19/2019 Initial Diagnosis   Intrahepatic cholangiocarcinoma ([place])   08/19/2019 Cancer Staging   Staging form: Intrahepatic Bile Duct, AJCC 8th Edition - Clinical stage from 08/19/2019: Stage II (cT2, cN0, cM0) - Signed by [name], MD on 08/19/2019   09/29/2019 - 07/19/2020 Chemotherapy   Cisplatin and Gemcitabine 2 weeks on/1 week off starting 09/29/19. Cisplatin held from cycle 9 (03/23/20) due to fluid status/Afib. He is now on maintenance Gemcitabine. Held after 07/19/20 to proceed with liver target therapy.     10/09/2019 Imaging   CT AP IMPRESSION: 1. The dominant right hepatic lobe mass is minimally reduced in size compared to prior exams, currently measuring 6.2 by 5.3 cm, previously 6.2 by 5.6 cm. However, there is a new small hypodense lesion centrally in the right hepatic lobe which is suspicious for a new small focus of tumor. Accordingly this is an overall mixed appearance.  2.  Continued hypoenhancement in the liver downstream of the tumor likely attributable to narrowing or occlusion of the right hepatic vein by the tumor. By virtue of its location the tumor wraps around the intrahepatic portion of the IVC. 3. 4 mm right middle lobe pulmonary nodule, stable compared to earliest available comparison of 07/18/2019. Surveillance of the patient's pulmonary nodules is recommended. 4. Other imaging findings of potential clinical significance: Coronary atherosclerosis. Cholelithiasis. Prominent stool throughout the colon favors constipation. Moderate prostatomegaly with heterogeneous enhancement of the prostate gland. Lumbar spondylosis and degenerative disc disease causing mild bilateral foraminal impingement at L3-4 and L4-5.   Aortic Atherosclerosis (ICD10-I70.0).   12/24/2019 Imaging   CT CAP W Contrast  IMPRESSION: 1. Right hepatic lobe mass and adjacent right hepatic lobe nodules appear grossly stable. No evidence of distant metastatic disease. 2. Continued stability of small pulmonary nodules. Recommend attention on follow-up. 3. Cholelithiasis. 4. Enlarged prostate. 5. Aortic atherosclerosis (ICD10-I70.0). Coronary artery calcification.   02/23/2020 Procedure   He had PAC placed on 02/23/20.    04/08/2020 Imaging   CT CAP  IMPRESSION: 1. Stable or minimally decreased size of a very ill-defined, hypodense and somewhat retractile appearing mass of the central right lobe of the liver abutting the inferior vena cava and right portal vein, measuring approximately 5.7 x 5.0 cm, previously 6.2 x 5.2 cm when measured similarly. Findings are consistent with stable or minimally improved cholangiocarcinoma. 2. Small hypodense nodules of the right lobe identified on prior examination are poorly appreciated on this single phase contrast examination although not grossly changed. Attention on follow-up. 3. There are new, moderate bilateral pleural effusions and associated  atelectasis or consolidation as well as a new small pericardial effusion, nonspecific although generally concerning and suspicious for malignant effusions. There is no directly visualized pleural nodularity. 4. Multiple small pulmonary nodules are stable.  No new nodules. 5. Coronary artery disease. Aortic Atherosclerosis (ICD10-I70.0).   07/06/2020 Imaging   MRI ABDIMPRESSION: 1. Interval decrease in size of the right hepatic lobe lesion in the medial aspect of the segment 7. Findings would suggest a good response to treatment with some contraction of the tumor. 2. No new hepatic lesions. No abdominal adenopathy or metastatic disease. 3. Stable mild intrahepatic biliary dilatation in the right hepatic lobe distal to the lesion. 4. Somewhat tortuous and almost beaded appearance of the hepatic and portal vein radicles. Findings could be radiation related. 5. Cholelithiasis.  CT chest wo contrast IMPRESSION: 1. Persistent/stable moderate-sized bilateral pleural effusions with overlying atelectasis. 2. Stable small right pulmonary nodules. No new or progressive findings. Recommend continued surveillance. 3. No mediastinal or hilar mass or adenopathy. 4. Stable advanced three-vessel coronary artery calcifications. 5. Cholelithiasis. Aortic Atherosclerosis (ICD10-I70.0)   08/11/2020 Procedure   Y90 on 08/11/20 and 08/26/20 with Dr Laurence Ferrari    11/30/2020 Imaging   MRI Abdomen  IMPRESSION: 1. Today's study demonstrates progression of disease with enlarging central lesion in the right lobe of the liver involving portions of segments 7 and 8, now with evidence of some tumor thrombus extension into the intrahepatic portion of the inferior vena cava. This is associated with increasing intrahepatic biliary ductal dilatation in segment 7 of the liver. 2. In addition, there is some subtle hyperenhancement of the distal common bile duct at and immediately proximally to the level of  the ampulla. There is also some subtle delayed enhancement around this region in the pancreatic head. This is of uncertain etiology and significance, but may be inflammatory and currently  is not associated with proximal common bile duct dilatation. However, close attention on follow-up imaging is recommended. 3. Cholelithiasis without evidence of acute cholecystitis at this time. 4. Aortic atherosclerosis.     12/24/2020 -  Chemotherapy   Restart Gemcitabine 2 weeks on/1 week off given disease progression beginning 12/24/20        CURRENT THERAPY:  RestartedGemcitabine 2 weeks on/1 week off starting 12/24/20  INTERVAL HISTORY:  Brandon Sherman is here for a follow up. He presents to the clinic with his wife. He notes he is able to tolerate treatment with mild fatigue. He is on Gabapentin $RemoveBefor'100mg'[id number]$  at night. For his neuropathy. I reviewed his medication list with him. He is on Lasix and potassium daily, he uses sucralfate only as needed, not daily. He is no longer on Vit D. He is currently on not on DM medication. He notes he tried to reduce sugar intake. His wife notes his legs are weaker.     REVIEW OF SYSTEMS:   Constitutional: Denies fevers, chills or abnormal weight loss (+) Fatigue  Eyes: Denies blurriness of vision Ears, nose, mouth, throat, and face: Denies mucositis or sore throat Respiratory: Denies cough, dyspnea or wheezes Cardiovascular: Denies palpitation, chest discomfort or lower extremity swelling Gastrointestinal:  Denies nausea, heartburn or change in bowel habits Skin: Denies abnormal skin rashes Lymphatics: Denies new lymphadenopathy or easy bruising Neurological:Denies numbness, tingling or new weaknesses Behavioral/Psych: Mood is stable, no new changes  All other systems were reviewed with the patient and are negative.  MEDICAL HISTORY:  Past Medical History:  Diagnosis Date  . Arthritis   . Diabetes ([place])   . GERD (gastroesophageal reflux disease)   .  Hyperlipidemia   . Hypertension   . Intrahepatic cholangiocarcinoma ([place])     SURGICAL HISTORY: Past Surgical History:  Procedure Laterality Date  . APPENDECTOMY  1980  . CARDIOVERSION N/A 04/27/2020   Procedure: CARDIOVERSION;  Surgeon: Dorothy Spark, MD;  Location: [facility] ENDOSCOPY;  Service: Cardiovascular;  Laterality: N/A;  . CARDIOVERSION N/A 05/19/2020   Procedure: CARDIOVERSION;  Surgeon: Elouise Munroe, MD;  Location[facility] ENDOSCOPY;  Service: Cardiovascular;  Laterality: N/A;  . COLONOSCOPY    . IR 3D INDEPENDENT WKST  08/11/2020  . IR 3D INDEPENDENT WKST  08/11/2020  . IR ANGIOGRAM SELECTIVE EACH ADDITIONAL VESSEL  08/11/2020  . IR ANGIOGRAM SELECTIVE EACH ADDITIONAL VESSEL  08/11/2020  . IR ANGIOGRAM SELECTIVE EACH ADDITIONAL VESSEL  08/11/2020  . IR ANGIOGRAM SELECTIVE EACH ADDITIONAL VESSEL  08/11/2020  . IR ANGIOGRAM SELECTIVE EACH ADDITIONAL VESSEL  08/11/2020  . IR ANGIOGRAM SELECTIVE EACH ADDITIONAL VESSEL  08/11/2020  . IR ANGIOGRAM SELECTIVE EACH ADDITIONAL VESSEL  08/26/2020  . IR ANGIOGRAM SELECTIVE EACH ADDITIONAL VESSEL  08/26/2020  . IR ANGIOGRAM VISCERAL SELECTIVE  08/11/2020  . IR ANGIOGRAM VISCERAL SELECTIVE  08/26/2020  . IR EMBO ARTERIAL NOT HEMORR HEMANG INC GUIDE ROADMAPPING  08/11/2020  . IR EMBO TUMOR ORGAN ISCHEMIA INFARCT INC GUIDE ROADMAPPING  08/26/2020  . IR IMAGING GUIDED PORT INSERTION  02/23/2020  . IR RADIOLOGIST EVAL & MGMT  07/14/2020  . IR RADIOLOGIST EVAL & MGMT  09/30/2020  . IR RADIOLOGIST EVAL & MGMT  12/01/2020  . IR US GUIDE VASC ACCESS LEFT  08/26/2020  . IR US GUIDE VASC ACCESS RIGHT  08/11/2020    I have reviewed the social history and family history with the patient and they are unchanged from previous note.  ALLERGIES:  has No Known Allergies.  MEDICATIONS:  Current Outpatient Medications  Medication Sig Dispense Refill  . gabapentin (NEURONTIN) 100 MG capsule Take 100 mg by mouth at bedtime.    Marland Kitchen allopurinol (ZYLOPRIM) 100 MG tablet  Take 100 mg by mouth daily.    Marland Kitchen apixaban (ELIQUIS) 5 MG TABS tablet Take 1 tablet (5 mg total) by mouth 2 (two) times daily. 180 tablet 3  . colchicine 0.6 MG tablet Take 1 tablet (0.6 mg total) by mouth daily as needed (gout flare). Take daily for 3 days, then as needed for gout flare 30 tablet 0  . diltiazem (CARDIZEM CD) 240 MG 24 hr capsule Take 1 capsule (240 mg total) by mouth daily. 90 capsule 3  . fenofibrate (TRICOR) 145 MG tablet Take 145 mg by mouth daily.    . finasteride (PROSCAR) 5 MG tablet Take 5 mg by mouth daily.    . furosemide (LASIX) 40 MG tablet Take 40 mg twice a day 90 tablet 3  . hydrALAZINE (APRESOLINE) 50 MG tablet TAKE 2 TABLETS BY MOUTH IN THE MORNING, 1 TABLET AT LUNCH AND 2 IN THE EVENING 450 tablet 1  . hydrALAZINE (APRESOLINE) 50 MG tablet Take 50 mg by mouth 2 (two) times daily.    Marland Kitchen KLOR-CON M20 20 MEQ tablet TAKE 1 TABLET BY MOUTH TWICE A DAY 60 tablet 1  . KLOR-CON M20 20 MEQ tablet TAKE 1 TABLET BY MOUTH TWICE A DAY 180 tablet 1  . lidocaine-prilocaine (EMLA) cream Apply 1 application topically as needed. 30 g 3  . magnesium oxide (MAG-OX) 400 MG tablet Take 1 tablet (400 mg total) by mouth daily. 30 tablet 0  . metoprolol succinate (TOPROL XL) 25 MG 24 hr tablet Take 1 tablet (25 mg total) by mouth daily. 90 tablet 3  . ondansetron (ZOFRAN) 8 MG tablet TAKE 1 TABLET BY MOUTH TWICE A DAY AS NEEDED START ON 3RD DAY AFTER CHEMOTHERAPY 30 tablet 1  . pantoprazole (PROTONIX) 40 MG tablet TAKE 1 TABLET BY MOUTH TWICE A DAY 180 tablet 2  . pravastatin (PRAVACHOL) 40 MG tablet Take 40 mg by mouth daily.    . sucralfate (CARAFATE) 1 g tablet Take 1 tablet (1 g total) by mouth every 6 (six) hours as needed. Please schedule a yearly follow up for further refills: [phone] 60 tablet 0  . tamsulosin (FLOMAX) 0.4 MG CAPS capsule Take 0.4 mg by mouth daily.     No current facility-administered medications for this visit.   Facility-Administered Medications Ordered  in Other Visits  Medication Dose Route Frequency Provider Last Rate Last Admin  . sodium chloride flush (NS) 0.9 % injection 10 mL  10 mL Intracatheter PRN [name], MD   10 mL at 02/04/21 1604    PHYSICAL EXAMINATION: ECOG PERFORMANCE STATUS: 1 - Symptomatic but completely ambulatory  Vitals:   02/04/21 1420  BP: 133/72  Pulse: 76  Resp: 15  Temp: 97.8 F (36.6 C)  SpO2: 98%   Filed Weights   02/04/21 1420  Weight: 217 lb 14.4 oz (98.8 kg)    Due to COVID19 we will limit examination to appearance. Patient had no complaints.  GENERAL:alert, no distress and comfortable SKIN: skin color normal, no rashes or significant lesions EYES: normal, Conjunctiva are pink and non-injected, sclera clear  NEURO: alert & oriented x 3 with fluent speech   LABORATORY DATA:  I have reviewed the data as listed CBC Latest Ref Rng & Units 02/04/2021 01/21/2021 01/14/2021  WBC 4.0 - 10.5 K/uL 7.5 3.2(L) 5.9  Hemoglobin 13.0 - 17.0 g/dL 11.6(L) 11.2(L) 11.7(L)  Hematocrit 39.0 - 52.0 % 34.9(L) 34.5(L) 35.2(L)  Platelets 150 - 400 K/uL 245 250 308     CMP Latest Ref Rng & Units 02/04/2021 01/21/2021 01/14/2021  Glucose 70 - 99 mg/dL 122(H) 214(H) 139(H)  BUN 8 - 23 mg/dL 23 19 29(H)  Creatinine 0.61 - 1.24 mg/dL 1.69(H) 1.71(H) 1.79(H)  Sodium 135 - 145 mmol/L 135 136 135  Potassium 3.5 - 5.1 mmol/L 4.2 4.1 4.2  Chloride 98 - 111 mmol/L 103 102 103  CO2 22 - 32 mmol/L _0 Calcium 8.9 - 10.3 mg/dL 9.4 9.6 9.5  Total Protein 6.5 - 8.1 g/dL 7.2 7.0 7.2  Total Bilirubin 0.3 - 1.2 mg/dL 0.5 0.4 0.4  Alkaline Phos 38 - 126 U/L 80 70 60  AST 15 - 41 U/L 33 39 26  ALT 0 - 44 U/L 28 42 22      RADIOGRAPHIC STUDIES: I have personally reviewed the radiological images as listed and agreed with the findings in the report. No results found.   ASSESSMENT & PLAN:  Brandon Sherman is a 75 y.o. male with    1.Intrahepatic cholangiocarcinoma, cT2N0Mx,unresectable,with indeterminate lung  nodules -He was diagnosed in 07/2019.CT scans and MRI liver showa large7.3cm mass in the right hepatic lobe whichabutsportal vein.  -He was seen byour local surgeon Dr. Barry Dienes andDr Carlis Abbott at [facility] concluded that cancer is not resectable due to the invasion to portal vein.Given cancer is non-resectable his cancer is likely not curable but still treatable. -He was treated with standardfirst line chemo with IV Cisplatin and Gemcitabine 2 weeks on/1 weekoff beginning 09/29/19.Cisplatin was held since C9 (03/23/20) due to fluid status/AFib. Hecontinued maintenanceGemcitabineuntil 07/19/20. He proceeded with Y90 on 08/11/20 and 08/26/20.  -His MRI Abdomen from 12/11/20 showed progression of diseasein liver with tumor thrombus extension into IVC.I discussedwith disease progression, Dr Geroge Baseman does not recommend more Y90 or liver target therapy at this point. -To control his disease I restarted Single agent Gemcitabine 2 weeks on/1 week off beginning 12/24/20. Plan to scan him in 3 months  -He has IDH-1 mutation, which makes him eligible for an [place] inhibitordrugIvosidenib. I would recommend this in later lines of treatment.  -He is tolerating Gemcitabine well overall. His fatigue is manageable. Labs reviewed, Hg 11.6. Overall adequate to proceed with C3D1 Gemcitabine today and proceed with day 8 next week  -f/u in 3 weeks, will order restaging CT on next visit  -plan to repeat scan after cycle 4   2. DM, HTN, HLD, Gout, LE edema -On Metformin, amlodipine, lisinopril., allopurinol, Lasix.Continue to f/u with his PCPand cardiologist. -His PCP has started him on Janumet for his feet burning.   3.AF andCHF -With PAC placement on 02/23/20 he wasfound to haveAfib. Based on [place].He is at moderate risk for stroke. -HeunderwentCardioversion with Dr. Meda Coffee on 04/27/20. After [facility] late 6/2021he had another cardioversion on 05/19/20. He has recovered well. -His Afib is  intermittent. He will continue his medications and f/u with cardiologist. -he is on Eliquis  4. Nicotine Use -He never smoked but has been chewing Tobacco for the past 60 years.He no longer drinks alcohol. -Hecurrentlychews 1-2 times a day.Iagainencouraged him toreduce and quit completely.  5.GERDand gastritis, history ofesophageal candidiasis, DM  -Hehad repeated EGDwith Dr. Havery Moros in 07/2019. 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  -Will monitor andmayrepeat EGDin future -He is not  currently on DM medication. I encouraged him to watch his diet and reduce sugar.    PLAN: -Labs reviewed, adequate to proceed with C3D1 Gemcitabine today at same dose -Lab, flush, Gemcitabine in 1, 3, 4 weeks  -F/u in 3 weeks, will order restaging scan on next visit    No problem-specific Assessment & Plan notes found for this encounter.   No orders of the defined types were placed in this encounter.  All questions were answered. The patient knows to call the clinic with any problems, questions or concerns. No barriers to learning was detected. The total time spent in the appointment was 30 minutes.     [name], MD 02/04/2021   I, Joslyn Devon, am acting as scribe for [name], MD.   I have reviewed the above documentation for accuracy and completeness, and I agree with the above.

## 2021-02-04 ENCOUNTER — Other Ambulatory Visit: Payer: Self-pay

## 2021-02-04 ENCOUNTER — Inpatient Hospital Stay (HOSPITAL_BASED_OUTPATIENT_CLINIC_OR_DEPARTMENT_OTHER): Payer: Medicare HMO | Admitting: Hematology

## 2021-02-04 ENCOUNTER — Inpatient Hospital Stay: Payer: Medicare HMO

## 2021-02-04 ENCOUNTER — Ambulatory Visit: Payer: Medicare HMO

## 2021-02-04 ENCOUNTER — Encounter: Payer: Self-pay | Admitting: Hematology

## 2021-02-04 VITALS — BP 133/72 | HR 76 | Temp 97.8°F | Resp 15 | Ht 69.0 in | Wt 217.9 lb

## 2021-02-04 DIAGNOSIS — C221 Intrahepatic bile duct carcinoma: Secondary | ICD-10-CM | POA: Diagnosis not present

## 2021-02-04 DIAGNOSIS — Z5111 Encounter for antineoplastic chemotherapy: Secondary | ICD-10-CM | POA: Diagnosis not present

## 2021-02-04 DIAGNOSIS — Z7189 Other specified counseling: Secondary | ICD-10-CM

## 2021-02-04 DIAGNOSIS — Z95828 Presence of other vascular implants and grafts: Secondary | ICD-10-CM

## 2021-02-04 LAB — CMP (CANCER CENTER ONLY)
ALT: 28 U/L (ref 0–44)
AST: 33 U/L (ref 15–41)
Albumin: 3.6 g/dL (ref 3.5–5.0)
Alkaline Phosphatase: 80 U/L (ref 38–126)
Anion gap: 6 (ref 5–15)
BUN: 23 mg/dL (ref 8–23)
CO2: 26 mmol/L (ref 22–32)
Calcium: 9.4 mg/dL (ref 8.9–10.3)
Chloride: 103 mmol/L (ref 98–111)
Creatinine: 1.69 mg/dL — ABNORMAL HIGH (ref 0.61–1.24)
GFR, Estimated: 43 mL/min — ABNORMAL LOW (ref >60)
Glucose, Bld: 122 mg/dL — ABNORMAL HIGH (ref 70–99)
Potassium: 4.2 mmol/L (ref 3.5–5.1)
Sodium: 135 mmol/L (ref 135–145)
Total Bilirubin: 0.5 mg/dL (ref 0.3–1.2)
Total Protein: 7.2 g/dL (ref 6.5–8.1)

## 2021-02-04 LAB — CBC WITH DIFFERENTIAL (CANCER CENTER ONLY)
Abs Immature Granulocytes: 0.05 10*3/uL (ref 0.00–0.07)
Basophils Absolute: 0 10*3/uL (ref 0.0–0.1)
Basophils Relative: 0 %
Eosinophils Absolute: 0 10*3/uL (ref 0.0–0.5)
Eosinophils Relative: 0 %
HCT: 34.9 % — ABNORMAL LOW (ref 39.0–52.0)
Hemoglobin: 11.6 g/dL — ABNORMAL LOW (ref 13.0–17.0)
Immature Granulocytes: 1 %
Lymphocytes Relative: 12 %
Lymphs Abs: 0.9 10*3/uL (ref 0.7–4.0)
MCH: 32.5 pg (ref 26.0–34.0)
MCHC: 33.2 g/dL (ref 30.0–36.0)
MCV: 97.8 fL (ref 80.0–100.0)
Monocytes Absolute: 0.9 10*3/uL (ref 0.1–1.0)
Monocytes Relative: 12 %
Neutro Abs: 5.5 10*3/uL (ref 1.7–7.7)
Neutrophils Relative %: 75 %
Platelet Count: 245 10*3/uL (ref 150–400)
RBC: 3.57 MIL/uL — ABNORMAL LOW (ref 4.22–5.81)
RDW: 19.2 % — ABNORMAL HIGH (ref 11.5–15.5)
WBC Count: 7.5 10*3/uL (ref 4.0–10.5)
nRBC: 0.3 % — ABNORMAL HIGH (ref 0.0–0.2)

## 2021-02-04 MED ORDER — SODIUM CHLORIDE 0.9 % IV SOLN
Freq: Once | INTRAVENOUS | Status: AC
  Filled 2021-02-04: qty 250

## 2021-02-04 MED ORDER — HEPARIN SOD (PORK) LOCK FLUSH 100 UNIT/ML IV SOLN
500.0000 [IU] | Freq: Once | INTRAVENOUS | Status: AC | PRN
  Administered 2021-02-04: 500 [IU]
  Filled 2021-02-04: qty 5

## 2021-02-04 MED ORDER — PROCHLORPERAZINE MALEATE 10 MG PO TABS
10.0000 mg | ORAL_TABLET | Freq: Once | ORAL | Status: AC
  Administered 2021-02-04: 10 mg via ORAL

## 2021-02-04 MED ORDER — SODIUM CHLORIDE 0.9% FLUSH
10.0000 mL | INTRAVENOUS | Status: DC | PRN
  Administered 2021-02-04: 10 mL
  Filled 2021-02-04: qty 10

## 2021-02-04 MED ORDER — SODIUM CHLORIDE 0.9 % IV SOLN
900.0000 mg/m2 | Freq: Once | INTRAVENOUS | Status: AC
  Administered 2021-02-04: 1938 mg via INTRAVENOUS
  Filled 2021-02-04: qty 50.97

## 2021-02-04 MED ORDER — SODIUM CHLORIDE 0.9% FLUSH
10.0000 mL | Freq: Once | INTRAVENOUS | Status: AC
  Administered 2021-02-04: 10 mL
  Filled 2021-02-04: qty 10

## 2021-02-04 MED ORDER — PROCHLORPERAZINE MALEATE 10 MG PO TABS
ORAL_TABLET | ORAL | Status: AC
  Filled 2021-02-04: qty 1

## 2021-02-04 NOTE — Patient Instructions (Signed)
Implanted Port Insertion, Care After This sheet gives you information about how to care for yourself after your procedure. Your health care provider may also give you more specific instructions. If you have problems or questions, contact your health care provider. What can I expect after the procedure? After the procedure, it is common to have:  Discomfort at the port insertion site.  Bruising on the skin over the port. This should improve over 3-4 days. Follow these instructions at home: Port care  After your port is placed, you will get a manufacturer's information card. The card has information about your port. Keep this card with you at all times.  Take care of the port as told by your health care provider. Ask your health care provider if you or a family member can get training for taking care of the port at home. A home health care nurse may also take care of the port.  Make sure to remember what type of port you have. Incision care  Follow instructions from your health care provider about how to take care of your port insertion site. Make sure you: ? Wash your hands with soap and water before and after you change your bandage (dressing). If soap and water are not available, use hand sanitizer. ? Change your dressing as told by your health care provider. ? Leave stitches (sutures), skin glue, or adhesive strips in place. These skin closures may need to stay in place for 2 weeks or longer. If adhesive strip edges start to loosen and curl up, you may trim the loose edges. Do not remove adhesive strips completely unless your health care provider tells you to do that.  Check your port insertion site every day for signs of infection. Check for: ? Redness, swelling, or pain. ? Fluid or blood. ? Warmth. ? Pus or a bad smell.      Activity  Return to your normal activities as told by your health care provider. Ask your health care provider what activities are safe for you.  Do not  lift anything that is heavier than 10 lb (4.5 kg), or the limit that you are told, until your health care provider says that it is safe. General instructions  Take over-the-counter and prescription medicines only as told by your health care provider.  Do not take baths, swim, or use a hot tub until your health care provider approves. Ask your health care provider if you may take showers. You may only be allowed to take sponge baths.  Do not drive for 24 hours if you were given a sedative during your procedure.  Wear a medical alert bracelet in case of an emergency. This will tell any health care providers that you have a port.  Keep all follow-up visits as told by your health care provider. This is important. Contact a health care provider if:  You cannot flush your port with saline as directed, or you cannot draw blood from the port.  You have a fever or chills.  You have redness, swelling, or pain around your port insertion site.  You have fluid or blood coming from your port insertion site.  Your port insertion site feels warm to the touch.  You have pus or a bad smell coming from the port insertion site. Get help right away if:  You have chest pain or shortness of breath.  You have bleeding from your port that you cannot control. Summary  Take care of the port as told by your   health care provider. Keep the manufacturer's information card with you at all times.  Change your dressing as told by your health care provider.  Contact a health care provider if you have a fever or chills or if you have redness, swelling, or pain around your port insertion site.  Keep all follow-up visits as told by your health care provider. This information is not intended to replace advice given to you by your health care provider. Make sure you discuss any questions you have with your health care provider. Document Revised: 06/04/2018 Document Reviewed: 06/04/2018 Elsevier Patient Education   2021 [employer]vier Inc.  

## 2021-02-04 NOTE — Patient Instructions (Signed)
[facility] Discharge Instructions for Patients Receiving Chemotherapy  Today you received the following chemotherapy agents: gemcitabine.  To help prevent nausea and vomiting after your treatment, we encourage you to take your nausea medication as directed.   If you develop nausea and vomiting that is not controlled by your nausea medication, call the clinic.   BELOW ARE SYMPTOMS THAT SHOULD BE REPORTED IMMEDIATELY:  *FEVER GREATER THAN 100.5 F  *CHILLS WITH OR WITHOUT FEVER  NAUSEA AND VOMITING THAT IS NOT CONTROLLED WITH YOUR NAUSEA MEDICATION  *UNUSUAL SHORTNESS OF BREATH  *UNUSUAL BRUISING OR BLEEDING  TENDERNESS IN MOUTH AND THROAT WITH OR WITHOUT PRESENCE OF ULCERS  *URINARY PROBLEMS  *BOWEL PROBLEMS  UNUSUAL RASH Items with * indicate a potential emergency and should be followed up as soon as possible.  Feel free to call the clinic should you have any questions or concerns. The clinic phone number is [phone].  Please show the CHEMO ALERT CARD at check-in to the Emergency Department and triage nurse.   

## 2021-02-04 NOTE — Progress Notes (Signed)
Per Dr. Burr Medico, ok to treat with elevated creatinine.

## 2021-02-07 ENCOUNTER — Telehealth: Payer: Self-pay | Admitting: Hematology

## 2021-02-07 ENCOUNTER — Other Ambulatory Visit: Payer: Self-pay | Admitting: Nurse Practitioner

## 2021-02-07 NOTE — Telephone Encounter (Signed)
Scheduled per 3/18 los. Pt will receive an updated appt calendar per next visit appt notes

## 2021-02-11 ENCOUNTER — Inpatient Hospital Stay: Payer: Medicare HMO

## 2021-02-11 ENCOUNTER — Other Ambulatory Visit: Payer: Self-pay

## 2021-02-11 VITALS — BP 125/73 | HR 62 | Temp 97.2°F | Resp 16 | Wt 219.0 lb

## 2021-02-11 DIAGNOSIS — C221 Intrahepatic bile duct carcinoma: Secondary | ICD-10-CM

## 2021-02-11 DIAGNOSIS — Z7189 Other specified counseling: Secondary | ICD-10-CM

## 2021-02-11 DIAGNOSIS — Z95828 Presence of other vascular implants and grafts: Secondary | ICD-10-CM

## 2021-02-11 DIAGNOSIS — Z5111 Encounter for antineoplastic chemotherapy: Secondary | ICD-10-CM | POA: Diagnosis not present

## 2021-02-11 LAB — CBC WITH DIFFERENTIAL (CANCER CENTER ONLY)
Abs Immature Granulocytes: 0.14 10*3/uL — ABNORMAL HIGH (ref 0.00–0.07)
Basophils Absolute: 0.1 10*3/uL (ref 0.0–0.1)
Basophils Relative: 2 %
Eosinophils Absolute: 0 10*3/uL (ref 0.0–0.5)
Eosinophils Relative: 1 %
HCT: 32.5 % — ABNORMAL LOW (ref 39.0–52.0)
Hemoglobin: 10.8 g/dL — ABNORMAL LOW (ref 13.0–17.0)
Immature Granulocytes: 4 %
Lymphocytes Relative: 23 %
Lymphs Abs: 0.8 10*3/uL (ref 0.7–4.0)
MCH: 33 pg (ref 26.0–34.0)
MCHC: 33.2 g/dL (ref 30.0–36.0)
MCV: 99.4 fL (ref 80.0–100.0)
Monocytes Absolute: 0.8 10*3/uL (ref 0.1–1.0)
Monocytes Relative: 24 %
Neutro Abs: 1.5 10*3/uL — ABNORMAL LOW (ref 1.7–7.7)
Neutrophils Relative %: 46 %
Platelet Count: 196 10*3/uL (ref 150–400)
RBC: 3.27 MIL/uL — ABNORMAL LOW (ref 4.22–5.81)
RDW: 18.7 % — ABNORMAL HIGH (ref 11.5–15.5)
WBC Count: 3.3 10*3/uL — ABNORMAL LOW (ref 4.0–10.5)
nRBC: 1.5 % — ABNORMAL HIGH (ref 0.0–0.2)

## 2021-02-11 LAB — CMP (CANCER CENTER ONLY)
ALT: 47 U/L — ABNORMAL HIGH (ref 0–44)
AST: 42 U/L — ABNORMAL HIGH (ref 15–41)
Albumin: 3.5 g/dL (ref 3.5–5.0)
Alkaline Phosphatase: 99 U/L (ref 38–126)
Anion gap: 11 (ref 5–15)
BUN: 18 mg/dL (ref 8–23)
CO2: 25 mmol/L (ref 22–32)
Calcium: 9.6 mg/dL (ref 8.9–10.3)
Chloride: 103 mmol/L (ref 98–111)
Creatinine: 1.58 mg/dL — ABNORMAL HIGH (ref 0.61–1.24)
GFR, Estimated: 46 mL/min — ABNORMAL LOW (ref >60)
Glucose, Bld: 156 mg/dL — ABNORMAL HIGH (ref 70–99)
Potassium: 3.9 mmol/L (ref 3.5–5.1)
Sodium: 139 mmol/L (ref 135–145)
Total Bilirubin: 0.5 mg/dL (ref 0.3–1.2)
Total Protein: 7.1 g/dL (ref 6.5–8.1)

## 2021-02-11 MED ORDER — SODIUM CHLORIDE 0.9 % IV SOLN
900.0000 mg/m2 | Freq: Once | INTRAVENOUS | Status: AC
  Administered 2021-02-11: 1938 mg via INTRAVENOUS
  Filled 2021-02-11: qty 50.97

## 2021-02-11 MED ORDER — SODIUM CHLORIDE 0.9% FLUSH
10.0000 mL | INTRAVENOUS | Status: DC | PRN
  Administered 2021-02-11: 10 mL
  Filled 2021-02-11: qty 10

## 2021-02-11 MED ORDER — SODIUM CHLORIDE 0.9% FLUSH
10.0000 mL | Freq: Once | INTRAVENOUS | Status: AC
  Administered 2021-02-11: 10 mL
  Filled 2021-02-11: qty 10

## 2021-02-11 MED ORDER — SODIUM CHLORIDE 0.9 % IV SOLN
Freq: Once | INTRAVENOUS | Status: AC
  Filled 2021-02-11: qty 250

## 2021-02-11 MED ORDER — PROCHLORPERAZINE MALEATE 10 MG PO TABS
10.0000 mg | ORAL_TABLET | Freq: Once | ORAL | Status: AC
  Administered 2021-02-11: 10 mg via ORAL

## 2021-02-11 MED ORDER — HEPARIN SOD (PORK) LOCK FLUSH 100 UNIT/ML IV SOLN
500.0000 [IU] | Freq: Once | INTRAVENOUS | Status: AC | PRN
  Administered 2021-02-11: 500 [IU]
  Filled 2021-02-11: qty 5

## 2021-02-11 MED ORDER — PROCHLORPERAZINE MALEATE 10 MG PO TABS
ORAL_TABLET | ORAL | Status: AC
  Filled 2021-02-11: qty 1

## 2021-02-11 NOTE — Progress Notes (Signed)
Per Dr Burr Medico ok to treat with creatinine 1.58

## 2021-02-11 NOTE — Patient Instructions (Signed)
[facility] Discharge Instructions for Patients Receiving Chemotherapy  Today you received the following chemotherapy agents: gemcitabine.  To help prevent nausea and vomiting after your treatment, we encourage you to take your nausea medication as directed.   If you develop nausea and vomiting that is not controlled by your nausea medication, call the clinic.   BELOW ARE SYMPTOMS THAT SHOULD BE REPORTED IMMEDIATELY:  *FEVER GREATER THAN 100.5 F  *CHILLS WITH OR WITHOUT FEVER  NAUSEA AND VOMITING THAT IS NOT CONTROLLED WITH YOUR NAUSEA MEDICATION  *UNUSUAL SHORTNESS OF BREATH  *UNUSUAL BRUISING OR BLEEDING  TENDERNESS IN MOUTH AND THROAT WITH OR WITHOUT PRESENCE OF ULCERS  *URINARY PROBLEMS  *BOWEL PROBLEMS  UNUSUAL RASH Items with * indicate a potential emergency and should be followed up as soon as possible.  Feel free to call the clinic should you have any questions or concerns. The clinic phone number is [phone].  Please show the CHEMO ALERT CARD at check-in to the Emergency Department and triage nurse.   

## 2021-02-25 NOTE — Progress Notes (Signed)
Brandon Sherman   [phone] [phone]   Clinic Follow up Note   Patient Care Team: [name], PA as PCP - General (Family Medicine) Lorretta Harp, MD as PCP - Cardiology (Cardiology) [name], MD as Consulting Physician (General Surgery) [name], [name], MD as Consulting Physician (Gastroenterology) [name], MD as Consulting Physician (Hematology) Lorretta Harp, MD as Consulting Physician (Cardiology)  Date of Service:  02/28/2021  CHIEF COMPLAINT: F/u cholangiocarcinoma  SUMMARY OF ONCOLOGIC HISTORY: Oncology History Overview Note  Cancer Staging Intrahepatic cholangiocarcinoma ([place]) Staging form: Intrahepatic Bile Duct, AJCC 8th Edition - Clinical stage from 08/19/2019: Stage II (cT2, cN0, cM0) - Signed by [name], MD on 08/19/2019    Intrahepatic cholangiocarcinoma ([place])  06/28/2019 Imaging   CT AP W Contrast 06/28/19  IMPRESSION: 1. Heterogeneous hypodensity posteriorly in the right hepatic lobe and potentially extending into the caudate lobe suspicious for a mass. There is felt to be truncation of branches of the portal vein in this vicinity and some narrowing of the hepatic vein, as well as triangular-shaped regions of abnormal hypoenhancement posteriorly in the right hepatic lobe likely representing downstream vascular effects. Cannot exclude malignancy such as cholangiocarcinoma or hepatocellular carcinoma, and follow up hepatic protocol MRI with and without contrast is recommended to further characterize. 2. 4 mm right middle lobe pulmonary nodule is likely benign but may merit surveillance. 3. Cholelithiasis. 4.  Aortic Atherosclerosis (ICD10-I70.0). 5. Prostatomegaly. 6. Mild impingement at L3-4 and L4-5.   07/31/2019 Imaging   MRI Liver 07/31/19 IMPRESSION: 1. 7.3 cm in long axis mass in the right hepatic lobe spanning into the caudate lobe, high suspicion for malignancy such as hepatocellular carcinoma  or cholangiocarcinoma. Suspected effacement or occlusion of the right hepatic vein and posterior branches of the right portal vein. Two smaller tumor nodules along the posterior periphery of the dominant mass. Tissue diagnosis is recommended. 2. No findings of pathologic adenopathy or distant metastatic spread. 3. 9 mm gallstone in the gallbladder. There is mild gallbladder wall thickening which may be from nondistention, correlate clinically in assessing for cholecystitis. 4.  Aortic Atherosclerosis (ICD10-I70.0). 5. Mild diffuse hepatic steatosis.   08/11/2019 Initial Biopsy   DIAGNOSIS: 08/11/19  A. LIVER, RIGHT, BIOPSY:  - Adenocarcinoma.   08/18/2019 Imaging   CT Chest 08/18/19  IMPRESSION: 1. Multiple pulmonary nodules largest at approximately 7 mm in the right lower lobe, nonspecific but concerning given findings in the liver. 2. No signs of definitive metastatic disease, also with mildly enlarged upper abdominal lymph nodes as discussed.   Aortic Atherosclerosis (ICD10-I70.0).   08/19/2019 Initial Diagnosis   Intrahepatic cholangiocarcinoma ([place])   08/19/2019 Cancer Staging   Staging form: Intrahepatic Bile Duct, AJCC 8th Edition - Clinical stage from 08/19/2019: Stage II (cT2, cN0, cM0) - Signed by [name], MD on 08/19/2019   09/29/2019 - 07/19/2020 Chemotherapy   Cisplatin and Gemcitabine 2 weeks on/1 week off starting 09/29/19. Cisplatin held from cycle 9 (03/23/20) due to fluid status/Afib. He is now on maintenance Gemcitabine. Held after 07/19/20 to proceed with liver target therapy.     10/09/2019 Imaging   CT AP IMPRESSION: 1. The dominant right hepatic lobe mass is minimally reduced in size compared to prior exams, currently measuring 6.2 by 5.3 cm, previously 6.2 by 5.6 cm. However, there is a new small hypodense lesion centrally in the right hepatic lobe which is suspicious for a new small focus of tumor. Accordingly this is an overall mixed appearance. 2.  Continued  hypoenhancement in the liver downstream of the tumor likely attributable to narrowing or occlusion of the right hepatic vein by the tumor. By virtue of its location the tumor wraps around the intrahepatic portion of the IVC. 3. 4 mm right middle lobe pulmonary nodule, stable compared to earliest available comparison of 07/18/2019. Surveillance of the patient's pulmonary nodules is recommended. 4. Other imaging findings of potential clinical significance: Coronary atherosclerosis. Cholelithiasis. Prominent stool throughout the colon favors constipation. Moderate prostatomegaly with heterogeneous enhancement of the prostate gland. Lumbar spondylosis and degenerative disc disease causing mild bilateral foraminal impingement at L3-4 and L4-5.   Aortic Atherosclerosis (ICD10-I70.0).   12/24/2019 Imaging   CT CAP W Contrast  IMPRESSION: 1. Right hepatic lobe mass and adjacent right hepatic lobe nodules appear grossly stable. No evidence of distant metastatic disease. 2. Continued stability of small pulmonary nodules. Recommend attention on follow-up. 3. Cholelithiasis. 4. Enlarged prostate. 5. Aortic atherosclerosis (ICD10-I70.0). Coronary artery calcification.   02/23/2020 Procedure   He had PAC placed on 02/23/20.    04/08/2020 Imaging   CT CAP  IMPRESSION: 1. Stable or minimally decreased size of a very ill-defined, hypodense and somewhat retractile appearing mass of the central right lobe of the liver abutting the inferior vena cava and right portal vein, measuring approximately 5.7 x 5.0 cm, previously 6.2 x 5.2 cm when measured similarly. Findings are consistent with stable or minimally improved cholangiocarcinoma. 2. Small hypodense nodules of the right lobe identified on prior examination are poorly appreciated on this single phase contrast examination although not grossly changed. Attention on follow-up. 3. There are new, moderate bilateral pleural effusions and associated atelectasis  or consolidation as well as a new small pericardial effusion, nonspecific although generally concerning and suspicious for malignant effusions. There is no directly visualized pleural nodularity. 4. Multiple small pulmonary nodules are stable.  No new nodules. 5. Coronary artery disease. Aortic Atherosclerosis (ICD10-I70.0).   07/06/2020 Imaging   MRI ABDIMPRESSION: 1. Interval decrease in size of the right hepatic lobe lesion in the medial aspect of the segment 7. Findings would suggest a good response to treatment with some contraction of the tumor. 2. No new hepatic lesions. No abdominal adenopathy or metastatic disease. 3. Stable mild intrahepatic biliary dilatation in the right hepatic lobe distal to the lesion. 4. Somewhat tortuous and almost beaded appearance of the hepatic and portal vein radicles. Findings could be radiation related. 5. Cholelithiasis.  CT chest wo contrast IMPRESSION: 1. Persistent/stable moderate-sized bilateral pleural effusions with overlying atelectasis. 2. Stable small right pulmonary nodules. No new or progressive findings. Recommend continued surveillance. 3. No mediastinal or hilar mass or adenopathy. 4. Stable advanced three-vessel coronary artery calcifications. 5. Cholelithiasis. Aortic Atherosclerosis (ICD10-I70.0)   08/11/2020 Procedure   Y90 on 08/11/20 and 08/26/20 with Dr Laurence Ferrari    11/30/2020 Imaging   MRI Abdomen  IMPRESSION: 1. Today's study demonstrates progression of disease with enlarging central lesion in the right lobe of the liver involving portions of segments 7 and 8, now with evidence of some tumor thrombus extension into the intrahepatic portion of the inferior vena cava. This is associated with increasing intrahepatic biliary ductal dilatation in segment 7 of the liver. 2. In addition, there is some subtle hyperenhancement of the distal common bile duct at and immediately proximally to the level of the ampulla. There is  also some subtle delayed enhancement around this region in the pancreatic head. This is of uncertain etiology and significance, but may be inflammatory and currently is  not associated with proximal common bile duct dilatation. However, close attention on follow-up imaging is recommended. 3. Cholelithiasis without evidence of acute cholecystitis at this time. 4. Aortic atherosclerosis.     12/24/2020 -  Chemotherapy   Restart Gemcitabine 2 weeks on/1 week off given disease progression beginning 12/24/20        CURRENT THERAPY:  RestartedGemcitabine 2 weeks on/1 week off starting 12/24/20  INTERVAL HISTORY:  Brandon Sherman is here for a follow up. He was last seen by me on 02/04/21. He presents to the clinic with his wife.  He is clinically doing well, denies any pain, nausea, or other discomfort.  He has mild fatigue after chemo, able to function fully at home.  Weight is stable, appetite is normal.  No other new complaints.   All other systems were reviewed with the patient and are negative.  MEDICAL HISTORY:  Past Medical History:  Diagnosis Date  . Arthritis   . Diabetes ([place])   . GERD (gastroesophageal reflux disease)   . Hyperlipidemia   . Hypertension   . Intrahepatic cholangiocarcinoma ([place])     SURGICAL HISTORY: Past Surgical History:  Procedure Laterality Date  . APPENDECTOMY  1980  . CARDIOVERSION N/A 04/27/2020   Procedure: CARDIOVERSION;  Surgeon: Dorothy Spark, MD;  Location: [facility] ENDOSCOPY;  Service: Cardiovascular;  Laterality: N/A;  . CARDIOVERSION N/A 05/19/2020   Procedure: CARDIOVERSION;  Surgeon: Elouise Munroe, MD;  Location: [facility] ENDOSCOPY;  Service: Cardiovascular;  Laterality: N/A;  . COLONOSCOPY    . IR 3D INDEPENDENT WKST  08/11/2020  . IR 3D INDEPENDENT WKST  08/11/2020  . IR ANGIOGRAM SELECTIVE EACH ADDITIONAL VESSEL  08/11/2020  . IR ANGIOGRAM SELECTIVE EACH ADDITIONAL VESSEL  08/11/2020  . IR ANGIOGRAM SELECTIVE EACH ADDITIONAL VESSEL   08/11/2020  . IR ANGIOGRAM SELECTIVE EACH ADDITIONAL VESSEL  08/11/2020  . IR ANGIOGRAM SELECTIVE EACH ADDITIONAL VESSEL  08/11/2020  . IR ANGIOGRAM SELECTIVE EACH ADDITIONAL VESSEL  08/11/2020  . IR ANGIOGRAM SELECTIVE EACH ADDITIONAL VESSEL  08/26/2020  . IR ANGIOGRAM SELECTIVE EACH ADDITIONAL VESSEL  08/26/2020  . IR ANGIOGRAM VISCERAL SELECTIVE  08/11/2020  . IR ANGIOGRAM VISCERAL SELECTIVE  08/26/2020  . IR EMBO ARTERIAL NOT HEMORR HEMANG INC GUIDE ROADMAPPING  08/11/2020  . IR EMBO TUMOR ORGAN ISCHEMIA INFARCT INC GUIDE ROADMAPPING  08/26/2020  . IR IMAGING GUIDED PORT INSERTION  02/23/2020  . IR RADIOLOGIST EVAL & MGMT  07/14/2020  . IR RADIOLOGIST EVAL & MGMT  09/30/2020  . IR RADIOLOGIST EVAL & MGMT  12/01/2020  . IR US GUIDE VASC ACCESS LEFT  08/26/2020  . IR US GUIDE VASC ACCESS RIGHT  08/11/2020    I have reviewed the social history and family history with the patient and they are unchanged from previous note.  ALLERGIES:  has No Known Allergies.  MEDICATIONS:  Current Outpatient Medications  Medication Sig Dispense Refill  . allopurinol (ZYLOPRIM) 100 MG tablet Take 100 mg by mouth daily.    Marland Kitchen apixaban (ELIQUIS) 5 MG TABS tablet Take 1 tablet (5 mg total) by mouth 2 (two) times daily. 180 tablet 3  . colchicine 0.6 MG tablet Take 1 tablet (0.6 mg total) by mouth daily as needed (gout flare). Take daily for 3 days, then as needed for gout flare 30 tablet 0  . diltiazem (CARDIZEM CD) 240 MG 24 hr capsule Take 1 capsule (240 mg total) by mouth daily. 90 capsule 3  . fenofibrate (TRICOR) 145 MG tablet Take 145 mg by  mouth daily.    . finasteride (PROSCAR) 5 MG tablet Take 5 mg by mouth daily.    . furosemide (LASIX) 40 MG tablet Take 40 mg twice a day 90 tablet 3  . gabapentin (NEURONTIN) 100 MG capsule Take 100 mg by mouth at bedtime.    . hydrALAZINE (APRESOLINE) 50 MG tablet TAKE 2 TABLETS BY MOUTH IN THE MORNING, 1 TABLET AT LUNCH AND 2 IN THE EVENING 450 tablet 1  . hydrALAZINE  (APRESOLINE) 50 MG tablet Take 50 mg by mouth 2 (two) times daily.    Marland Kitchen KLOR-CON M20 20 MEQ tablet TAKE 1 TABLET BY MOUTH TWICE A DAY 60 tablet 1  . KLOR-CON M20 20 MEQ tablet TAKE 1 TABLET BY MOUTH TWICE A DAY 180 tablet 1  . lidocaine-prilocaine (EMLA) cream Apply 1 application topically as needed. 30 g 3  . magnesium oxide (MAG-OX) 400 MG tablet Take 1 tablet (400 mg total) by mouth daily. 30 tablet 0  . metoprolol succinate (TOPROL XL) 25 MG 24 hr tablet Take 1 tablet (25 mg total) by mouth daily. 90 tablet 3  . ondansetron (ZOFRAN) 8 MG tablet TAKE 1 TABLET BY MOUTH TWICE A DAY AS NEEDED START ON 3RD DAY AFTER CHEMOTHERAPY 30 tablet 1  . pantoprazole (PROTONIX) 40 MG tablet TAKE 1 TABLET BY MOUTH TWICE A DAY 180 tablet 2  . pravastatin (PRAVACHOL) 40 MG tablet Take 40 mg by mouth daily.    . sucralfate (CARAFATE) 1 g tablet Take 1 tablet (1 g total) by mouth every 6 (six) hours as needed. Please schedule a yearly follow up for further refills: [phone] 60 tablet 0  . tamsulosin (FLOMAX) 0.4 MG CAPS capsule Take 0.4 mg by mouth daily.     No current facility-administered medications for this visit.    PHYSICAL EXAMINATION: ECOG PERFORMANCE STATUS: 1 - Symptomatic but completely ambulatory  Vitals:   02/28/21 0805  BP: (!) 149/69  Pulse: 60  Resp: 19  Temp: 98.5 F (36.9 C)  SpO2: 100%   Filed Weights   02/28/21 0805  Weight: 224 lb 8 oz (101.8 kg)    GENERAL:alert, no distress and comfortable SKIN: skin color, texture, turgor are normal, no rashes or significant lesions EYES: normal, Conjunctiva are pink and non-injected, sclera clear NECK: supple, thyroid normal size, non-tender, without nodularity LYMPH:  no palpable lymphadenopathy in the cervical, axillary  LUNGS: clear to auscultation and percussion with normal breathing effort HEART: regular rate & rhythm and no murmurs and no lower extremity edema ABDOMEN:abdomen soft, non-tender and normal bowel  sounds Musculoskeletal:no cyanosis of digits and no clubbing  NEURO: alert & oriented x 3 with fluent speech, no focal motor/sensory deficits  LABORATORY DATA:  I have reviewed the data as listed CBC Latest Ref Rng & Units 02/28/2021 02/11/2021 02/04/2021  WBC 4.0 - 10.5 K/uL 5.4 3.3(L) 7.5  Hemoglobin 13.0 - 17.0 g/dL 10.9(L) 10.8(L) 11.6(L)  Hematocrit 39.0 - 52.0 % 33.7(L) 32.5(L) 34.9(L)  Platelets 150 - 400 K/uL 229 196 245     CMP Latest Ref Rng & Units 02/11/2021 02/04/2021 01/21/2021  Glucose 70 - 99 mg/dL 156(H) 122(H) 214(H)  BUN 8 - 23 mg/dL _0 Creatinine 0.61 - 1.24 mg/dL 1.58(H) 1.69(H) 1.71(H)  Sodium 135 - 145 mmol/L 139 135 136  Potassium 3.5 - 5.1 mmol/L 3.9 4.2 4.1  Chloride 98 - 111 mmol/L 103 103 102  CO2 22 - 32 mmol/L _1 Calcium 8.9 - 10.3 mg/dL  9.6 9.4 9.6  Total Protein 6.5 - 8.1 g/dL 7.1 7.2 7.0  Total Bilirubin 0.3 - 1.2 mg/dL 0.5 0.5 0.4  Alkaline Phos 38 - 126 U/L 99 80 70  AST 15 - 41 U/L 42(H) 33 39  ALT 0 - 44 U/L 47(H) 28 42      RADIOGRAPHIC STUDIES: I have personally reviewed the radiological images as listed and agreed with the findings in the report. No results found.   ASSESSMENT & PLAN:  Brandon Sherman is a 75 y.o. male with    1.Intrahepatic cholangiocarcinoma, cT2N0Mx,unresectable,with indeterminate lung nodules -He was diagnosed in 07/2019.CT scans and MRI liver showa large7.3cm mass in the right hepatic lobe whichabutsportal vein.  -He was seen byour local surgeon Dr. Barry Dienes andDr Carlis Abbott at [facility] concluded that cancer is not resectable due to the invasion to portal vein.Given cancer is non-resectable his cancer is likely not curable but still treatable. -He was treated with standardfirst line chemo with IV Cisplatin and Gemcitabine 2 weeks on/1 weekoff beginning 09/29/19.Cisplatin was held since C9 (03/23/20) due to fluid status/AFib. Hecontinued maintenanceGemcitabineuntil 07/19/20. He proceeded with Y90  on 08/11/20 and 08/26/20.  -His MRI Abdomen from 12/11/20 showed progression of diseasein liver with tumor thrombus extension into IVC.I discussedwith disease progression, Dr Geroge Baseman does not recommend more Y90 or liver target therapy at this point. -To control his disease I restarted Single agent Gemcitabine 2 weeks on/1 week off beginning 12/24/20. Plan to scan him in 3 months -He has IDH-1 mutation, which makes him eligible for an [place] inhibitordrugIvosidenib.  I reviewed the benefit and side effects, especially autoimmune related pneumonitis, colitis, thyroid disorder, and other endocrine disorders, he voiced good understanding, and agrees to proceed.  If his insurance denies, will try to get drug assistance. -He is clinically doing well, asymptomatic except mild fatigue, tolerating chemotherapy very well  -Lab reviewed, adequate for treatment, will proceed gemcitabine today and next week -Follow-up in 3 weeks before next cycle, and will repeat abdominal MRI with and without contrast for restaging  2. DM, HTN, HLD, Gout, LE edema -On Metformin, amlodipine, lisinopril., allopurinol, Lasix.Continue to f/u with his PCPand cardiologist. -His PCP has started him on Janumet for his feet burning.  3.AF andCHF -With PAC placement on 02/23/20 he wasfound to haveAfib. Based on [place].He is at moderate risk for stroke. -HeunderwentCardioversion with Dr. Meda Coffee on 04/27/20. After [facility] late 6/2021he had another cardioversion on 05/19/20. He has recovered well. -His Afib is intermittent. He will continue his medications and f/u with cardiologist. -he is on Eliquis  4. Nicotine Use -He never smoked but has been chewing Tobacco for the past 60 years.He no longer drinks alcohol. -Hecurrentlychews 1-2 times a day.Iagainencouraged him toreduce and quit completely.  5.GERDand gastritis, history ofesophageal candidiasis, DM  -Hehad repeated EGDwith Dr. Havery Moros  in 07/2019. 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  -Will monitor andmayrepeat EGDin future -He is not currently on DM medication. I encouraged him to watch his diet and reduce sugar.    PLAN: -Labs reviewed, adequate to proceed with C4D1Gemcitabine today at same dose -Lab, flush, Gemcitabine in 1, 3, 4 weeks  -F/u in 3 weeks, with abdominal MRI with and without contrast a few days before for restaging   No problem-specific Assessment & Plan notes found for this encounter.   Orders Placed This Encounter  Procedures  . MR Abdomen W Wo Contrast    Standing Status:  Future    Standing Expiration Date:   02/28/2022    Order Specific Question:   If indicated for the ordered procedure, I authorize the administration of contrast media per Radiology protocol    Answer:   Yes    Order Specific Question:   What is the patient's sedation requirement?    Answer:   No Sedation    Order Specific Question:   Does the patient have a pacemaker or implanted devices?    Answer:   No    Order Specific Question:   Preferred imaging location?    Answer:   [facility] (table limit - 550 lbs)  . Magnesium    Please add on to CMP today    Standing Status:   Future    Number of Occurrences:   1    Standing Expiration Date:   02/28/2022   All questions were answered. The patient knows to call the clinic with any problems, questions or concerns. No barriers to learning was detected. The total time spent in the appointment was 30 minutes.     [name], MD 02/28/2021   I, Joslyn Devon, am acting as scribe for [name], MD.   I have reviewed the above documentation for accuracy and completeness, and I agree with the above.

## 2021-02-28 ENCOUNTER — Inpatient Hospital Stay: Payer: Medicare HMO | Attending: Hematology

## 2021-02-28 ENCOUNTER — Inpatient Hospital Stay: Payer: Medicare HMO

## 2021-02-28 ENCOUNTER — Inpatient Hospital Stay: Payer: Medicare HMO | Admitting: Hematology

## 2021-02-28 ENCOUNTER — Other Ambulatory Visit: Payer: Self-pay

## 2021-02-28 VITALS — BP 149/69 | HR 60 | Temp 98.5°F | Resp 19 | Ht 69.0 in | Wt 224.5 lb

## 2021-02-28 DIAGNOSIS — C221 Intrahepatic bile duct carcinoma: Secondary | ICD-10-CM

## 2021-02-28 DIAGNOSIS — Z5111 Encounter for antineoplastic chemotherapy: Secondary | ICD-10-CM | POA: Insufficient documentation

## 2021-02-28 DIAGNOSIS — Z7189 Other specified counseling: Secondary | ICD-10-CM

## 2021-02-28 DIAGNOSIS — Z95828 Presence of other vascular implants and grafts: Secondary | ICD-10-CM

## 2021-02-28 LAB — CMP (CANCER CENTER ONLY)
ALT: 17 U/L (ref 0–44)
AST: 23 U/L (ref 15–41)
Albumin: 3.5 g/dL (ref 3.5–5.0)
Alkaline Phosphatase: 62 U/L (ref 38–126)
Anion gap: 12 (ref 5–15)
BUN: 13 mg/dL (ref 8–23)
CO2: 23 mmol/L (ref 22–32)
Calcium: 9 mg/dL (ref 8.9–10.3)
Chloride: 105 mmol/L (ref 98–111)
Creatinine: 1.46 mg/dL — ABNORMAL HIGH (ref 0.61–1.24)
GFR, Estimated: 50 mL/min — ABNORMAL LOW (ref >60)
Glucose, Bld: 117 mg/dL — ABNORMAL HIGH (ref 70–99)
Potassium: 3.7 mmol/L (ref 3.5–5.1)
Sodium: 140 mmol/L (ref 135–145)
Total Bilirubin: 0.5 mg/dL (ref 0.3–1.2)
Total Protein: 6.5 g/dL (ref 6.5–8.1)

## 2021-02-28 LAB — CBC WITH DIFFERENTIAL (CANCER CENTER ONLY)
Abs Immature Granulocytes: 0.02 10*3/uL (ref 0.00–0.07)
Basophils Absolute: 0.1 10*3/uL (ref 0.0–0.1)
Basophils Relative: 1 %
Eosinophils Absolute: 0.1 10*3/uL (ref 0.0–0.5)
Eosinophils Relative: 2 %
HCT: 33.7 % — ABNORMAL LOW (ref 39.0–52.0)
Hemoglobin: 10.9 g/dL — ABNORMAL LOW (ref 13.0–17.0)
Immature Granulocytes: 0 %
Lymphocytes Relative: 13 %
Lymphs Abs: 0.7 10*3/uL (ref 0.7–4.0)
MCH: 33.5 pg (ref 26.0–34.0)
MCHC: 32.3 g/dL (ref 30.0–36.0)
MCV: 103.7 fL — ABNORMAL HIGH (ref 80.0–100.0)
Monocytes Absolute: 0.8 10*3/uL (ref 0.1–1.0)
Monocytes Relative: 15 %
Neutro Abs: 3.7 10*3/uL (ref 1.7–7.7)
Neutrophils Relative %: 69 %
Platelet Count: 229 10*3/uL (ref 150–400)
RBC: 3.25 MIL/uL — ABNORMAL LOW (ref 4.22–5.81)
RDW: 19.3 % — ABNORMAL HIGH (ref 11.5–15.5)
WBC Count: 5.4 10*3/uL (ref 4.0–10.5)
nRBC: 0 % (ref 0.0–0.2)

## 2021-02-28 LAB — MAGNESIUM: Magnesium: 1.8 mg/dL (ref 1.7–2.4)

## 2021-02-28 MED ORDER — SODIUM CHLORIDE 0.9% FLUSH
10.0000 mL | Freq: Once | INTRAVENOUS | Status: AC
  Administered 2021-02-28: 10 mL
  Filled 2021-02-28: qty 10

## 2021-02-28 MED ORDER — SODIUM CHLORIDE 0.9 % IV SOLN
Freq: Once | INTRAVENOUS | Status: AC
  Filled 2021-02-28: qty 250

## 2021-02-28 MED ORDER — HEPARIN SOD (PORK) LOCK FLUSH 100 UNIT/ML IV SOLN
500.0000 [IU] | Freq: Once | INTRAVENOUS | Status: AC | PRN
  Administered 2021-02-28: 500 [IU]
  Filled 2021-02-28: qty 5

## 2021-02-28 MED ORDER — SODIUM CHLORIDE 0.9 % IV SOLN
900.0000 mg/m2 | Freq: Once | INTRAVENOUS | Status: AC
  Administered 2021-02-28: 1938 mg via INTRAVENOUS
  Filled 2021-02-28: qty 50.97

## 2021-02-28 MED ORDER — PROCHLORPERAZINE MALEATE 10 MG PO TABS
ORAL_TABLET | ORAL | Status: AC
  Filled 2021-02-28: qty 1

## 2021-02-28 MED ORDER — PROCHLORPERAZINE MALEATE 10 MG PO TABS
10.0000 mg | ORAL_TABLET | Freq: Once | ORAL | Status: AC
Start: 2021-02-28 — End: 2021-02-28
  Administered 2021-02-28: 10 mg via ORAL

## 2021-02-28 MED ORDER — SODIUM CHLORIDE 0.9% FLUSH
10.0000 mL | INTRAVENOUS | Status: DC | PRN
  Administered 2021-02-28: 10 mL
  Filled 2021-02-28: qty 10

## 2021-02-28 NOTE — Patient Instructions (Signed)
[facility] °Discharge Instructions for Patients Receiving Chemotherapy ° °Today you received the following chemotherapy agents Gemzar ° °To help prevent nausea and vomiting after your treatment, we encourage you to take your nausea medication as directed. °  °If you develop nausea and vomiting that is not controlled by your nausea medication, call the clinic.  ° °BELOW ARE SYMPTOMS THAT SHOULD BE REPORTED IMMEDIATELY: °· *FEVER GREATER THAN 100.5 F °· *CHILLS WITH OR WITHOUT FEVER °· NAUSEA AND VOMITING THAT IS NOT CONTROLLED WITH YOUR NAUSEA MEDICATION °· *UNUSUAL SHORTNESS OF BREATH °· *UNUSUAL BRUISING OR BLEEDING °· TENDERNESS IN MOUTH AND THROAT WITH OR WITHOUT PRESENCE OF ULCERS °· *URINARY PROBLEMS °· *BOWEL PROBLEMS °· UNUSUAL RASH °Items with * indicate a potential emergency and should be followed up as soon as possible. ° °Feel free to call the clinic should you have any questions or concerns. The clinic phone number is [phone]. ° °Please show the CHEMO ALERT CARD at check-in to the Emergency Department and triage nurse. ° ° °

## 2021-03-02 ENCOUNTER — Encounter: Payer: Self-pay | Admitting: Hematology

## 2021-03-04 ENCOUNTER — Telehealth: Payer: Self-pay | Admitting: Hematology

## 2021-03-04 ENCOUNTER — Telehealth: Payer: Self-pay | Admitting: *Deleted

## 2021-03-04 NOTE — Telephone Encounter (Signed)
Scheduled follow-up appointments per 4/11 los. Patient's wife is aware.

## 2021-03-04 NOTE — Telephone Encounter (Signed)
Notified that Magnesium level is ok. Does not need to take Magnesium tablets.

## 2021-03-07 ENCOUNTER — Other Ambulatory Visit: Payer: Self-pay

## 2021-03-07 ENCOUNTER — Inpatient Hospital Stay: Payer: Medicare HMO

## 2021-03-07 VITALS — BP 135/65 | HR 70 | Temp 97.9°F | Resp 18 | Wt 224.5 lb

## 2021-03-07 DIAGNOSIS — C221 Intrahepatic bile duct carcinoma: Secondary | ICD-10-CM | POA: Diagnosis not present

## 2021-03-07 DIAGNOSIS — Z5111 Encounter for antineoplastic chemotherapy: Secondary | ICD-10-CM | POA: Diagnosis not present

## 2021-03-07 DIAGNOSIS — Z7189 Other specified counseling: Secondary | ICD-10-CM

## 2021-03-07 LAB — CBC WITH DIFFERENTIAL (CANCER CENTER ONLY)
Abs Immature Granulocytes: 0.41 10*3/uL — ABNORMAL HIGH (ref 0.00–0.07)
Basophils Absolute: 0.1 10*3/uL (ref 0.0–0.1)
Basophils Relative: 2 %
Eosinophils Absolute: 0 10*3/uL (ref 0.0–0.5)
Eosinophils Relative: 0 %
HCT: 31.7 % — ABNORMAL LOW (ref 39.0–52.0)
Hemoglobin: 10.7 g/dL — ABNORMAL LOW (ref 13.0–17.0)
Immature Granulocytes: 9 %
Lymphocytes Relative: 15 %
Lymphs Abs: 0.7 10*3/uL (ref 0.7–4.0)
MCH: 34.3 pg — ABNORMAL HIGH (ref 26.0–34.0)
MCHC: 33.8 g/dL (ref 30.0–36.0)
MCV: 101.6 fL — ABNORMAL HIGH (ref 80.0–100.0)
Monocytes Absolute: 1 10*3/uL (ref 0.1–1.0)
Monocytes Relative: 22 %
Neutro Abs: 2.4 10*3/uL (ref 1.7–7.7)
Neutrophils Relative %: 52 %
Platelet Count: 152 10*3/uL (ref 150–400)
RBC: 3.12 MIL/uL — ABNORMAL LOW (ref 4.22–5.81)
RDW: 17.8 % — ABNORMAL HIGH (ref 11.5–15.5)
WBC Count: 4.5 10*3/uL (ref 4.0–10.5)
nRBC: 3.3 % — ABNORMAL HIGH (ref 0.0–0.2)

## 2021-03-07 LAB — CMP (CANCER CENTER ONLY)
ALT: 31 U/L (ref 0–44)
AST: 44 U/L — ABNORMAL HIGH (ref 15–41)
Albumin: 3.3 g/dL — ABNORMAL LOW (ref 3.5–5.0)
Alkaline Phosphatase: 79 U/L (ref 38–126)
Anion gap: 11 (ref 5–15)
BUN: 17 mg/dL (ref 8–23)
CO2: 25 mmol/L (ref 22–32)
Calcium: 8.9 mg/dL (ref 8.9–10.3)
Chloride: 104 mmol/L (ref 98–111)
Creatinine: 1.34 mg/dL — ABNORMAL HIGH (ref 0.61–1.24)
GFR, Estimated: 56 mL/min — ABNORMAL LOW (ref >60)
Glucose, Bld: 133 mg/dL — ABNORMAL HIGH (ref 70–99)
Potassium: 3.4 mmol/L — ABNORMAL LOW (ref 3.5–5.1)
Sodium: 140 mmol/L (ref 135–145)
Total Bilirubin: 0.6 mg/dL (ref 0.3–1.2)
Total Protein: 6.7 g/dL (ref 6.5–8.1)

## 2021-03-07 MED ORDER — SODIUM CHLORIDE 0.9% FLUSH
10.0000 mL | INTRAVENOUS | Status: DC | PRN
  Administered 2021-03-07: 10 mL
  Filled 2021-03-07: qty 10

## 2021-03-07 MED ORDER — SODIUM CHLORIDE 0.9 % IV SOLN
900.0000 mg/m2 | Freq: Once | INTRAVENOUS | Status: AC
  Administered 2021-03-07: 1938 mg via INTRAVENOUS
  Filled 2021-03-07: qty 50.97

## 2021-03-07 MED ORDER — PROCHLORPERAZINE MALEATE 10 MG PO TABS
ORAL_TABLET | ORAL | Status: AC
  Filled 2021-03-07: qty 1

## 2021-03-07 MED ORDER — PROCHLORPERAZINE MALEATE 10 MG PO TABS
10.0000 mg | ORAL_TABLET | Freq: Once | ORAL | Status: AC
  Administered 2021-03-07: 10 mg via ORAL

## 2021-03-07 MED ORDER — HEPARIN SOD (PORK) LOCK FLUSH 100 UNIT/ML IV SOLN
500.0000 [IU] | Freq: Once | INTRAVENOUS | Status: AC | PRN
  Administered 2021-03-07: 500 [IU]
  Filled 2021-03-07: qty 5

## 2021-03-07 MED ORDER — SODIUM CHLORIDE 0.9 % IV SOLN
Freq: Once | INTRAVENOUS | Status: AC
  Filled 2021-03-07: qty 250

## 2021-03-07 NOTE — Patient Instructions (Signed)
[facility] °Discharge Instructions for Patients Receiving Chemotherapy ° °Today you received the following chemotherapy agents Gemzar ° °To help prevent nausea and vomiting after your treatment, we encourage you to take your nausea medication as directed. °  °If you develop nausea and vomiting that is not controlled by your nausea medication, call the clinic.  ° °BELOW ARE SYMPTOMS THAT SHOULD BE REPORTED IMMEDIATELY: °· *FEVER GREATER THAN 100.5 F °· *CHILLS WITH OR WITHOUT FEVER °· NAUSEA AND VOMITING THAT IS NOT CONTROLLED WITH YOUR NAUSEA MEDICATION °· *UNUSUAL SHORTNESS OF BREATH °· *UNUSUAL BRUISING OR BLEEDING °· TENDERNESS IN MOUTH AND THROAT WITH OR WITHOUT PRESENCE OF ULCERS °· *URINARY PROBLEMS °· *BOWEL PROBLEMS °· UNUSUAL RASH °Items with * indicate a potential emergency and should be followed up as soon as possible. ° °Feel free to call the clinic should you have any questions or concerns. The clinic phone number is [phone]. ° °Please show the CHEMO ALERT CARD at check-in to the Emergency Department and triage nurse. ° ° °

## 2021-03-16 ENCOUNTER — Other Ambulatory Visit: Payer: Self-pay

## 2021-03-16 ENCOUNTER — Ambulatory Visit (HOSPITAL_COMMUNITY)
Admission: RE | Admit: 2021-03-16 | Discharge: 2021-03-16 | Disposition: A | Discharge status: Home / Self Care | Payer: Medicare HMO | Source: Ambulatory Visit | Attending: Hematology | Admitting: Hematology

## 2021-03-16 DIAGNOSIS — K8020 Calculus of gallbladder without cholecystitis without obstruction: Secondary | ICD-10-CM | POA: Diagnosis not present

## 2021-03-16 DIAGNOSIS — C221 Intrahepatic bile duct carcinoma: Secondary | ICD-10-CM | POA: Insufficient documentation

## 2021-03-16 DIAGNOSIS — I81 Portal vein thrombosis: Secondary | ICD-10-CM | POA: Diagnosis not present

## 2021-03-16 DIAGNOSIS — K7689 Other specified diseases of liver: Secondary | ICD-10-CM | POA: Diagnosis not present

## 2021-03-16 IMAGING — MR MR ABDOMEN WO/W CM
15 series · 48 of 48 positions shown · IV contrast (gadavist)
Comparison: [DATE]

CLINICAL DATA: Follow-up intrahepatic cholangiocarcinoma. Status
post Y-90 radioembolization.

EXAM:
MRI ABDOMEN WITHOUT AND WITH CONTRAST
TECHNIQUE: Multiplanar multisequence MR imaging of the abdomen was performed
both before and after the administration of intravenous contrast.
CONTRAST:  10mL GADAVIST GADOBUTROL 1 MMOL/ML IV SOLN

[Series 2: DWI · axial · 6.0mm · 1.49mm/px · z∈[-141,+111]mm · 3 of 72 slices shown (1 of 2)]
[im 1/72]
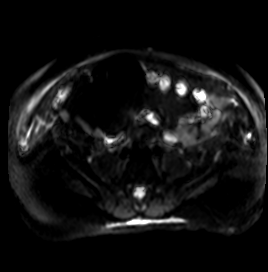
[im 36/72]
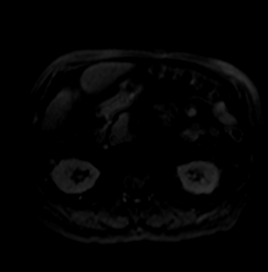
[im 72/72]
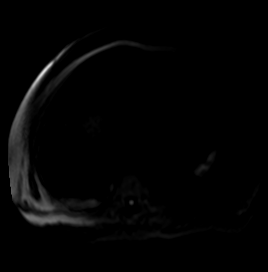

[Series 3: DWI · axial · 6.0mm · 1.49mm/px · z∈[-141,+111]mm · 2 of 36 slices shown (2 of 2)]
[im 1/36]
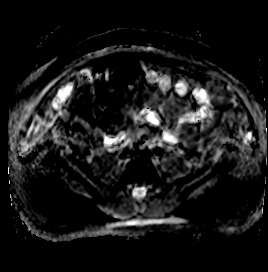
[im 36/36]
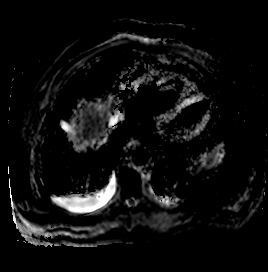

[Series 4: T2 fat-sat · axial · 6.0mm · 1.25mm/px · z∈[-103,+149]mm · 2 of 36 slices shown]
[im 1/36]
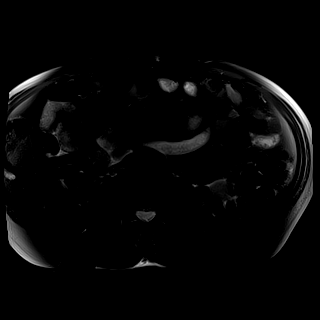
[im 36/36]
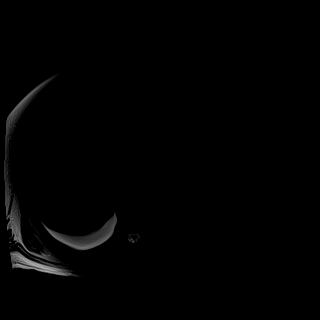

[Series 6: T2 · coronal · 6.0mm · 1.72mm/px · 2 of 34 slices shown (1 of 2)]
[im 1/34]
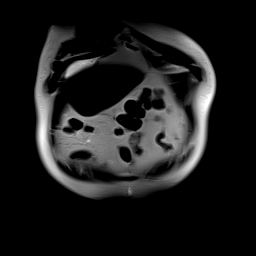
[im 34/34]
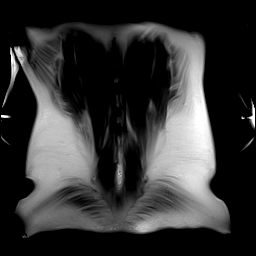

[Series 7: T1 · axial · 3.0mm · 1.25mm/px · z∈[-116,+97]mm · 3 of 72 slices shown (1 of 2)]
[im 1/72]
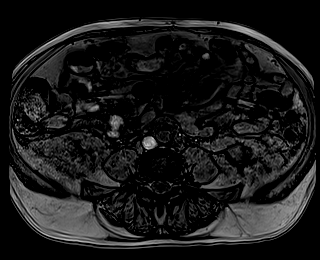
[im 36/72]
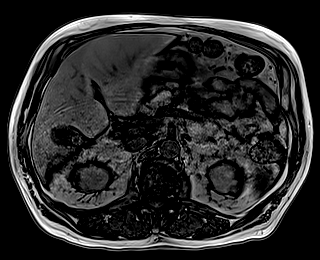
[im 72/72]
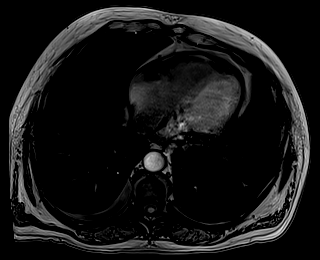

[Series 8: T1 · axial · 3.0mm · 1.25mm/px · z∈[-116,+97]mm · 3 of 72 slices shown (2 of 2)]
[im 1/72]
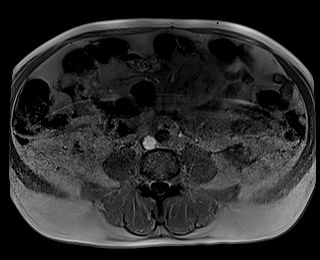
[im 36/72]
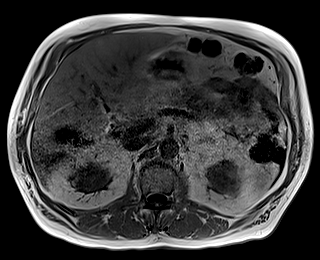
[im 72/72]
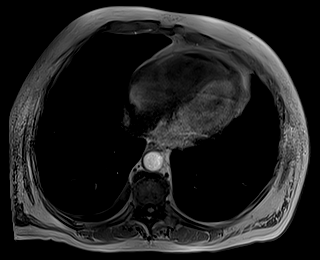

[Series 9: T2 · axial · 6.0mm · 1.56mm/px · 1 of 30 slices shown (2 of 2)]
[im 1/30]
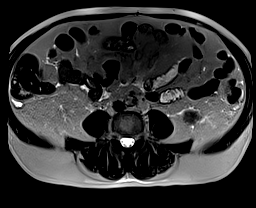

[Series 11: T1 dynamic · axial · 3.0mm · 1.25mm/px · z∈[-141,+96]mm · 4 of 80 slices shown (1 of 8)]
[im 1/80]
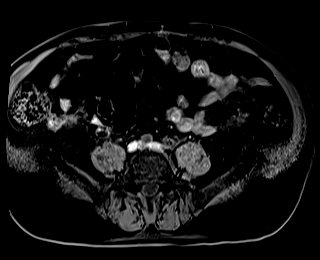
[im 27/80]
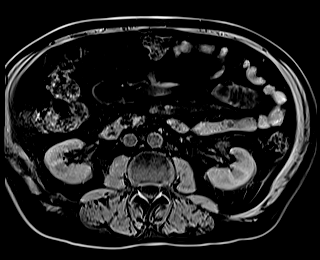
[im 53/80]
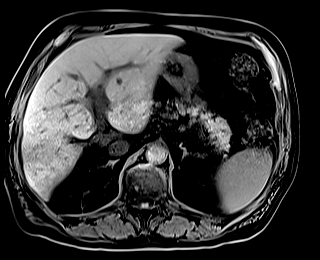
[im 80/80]
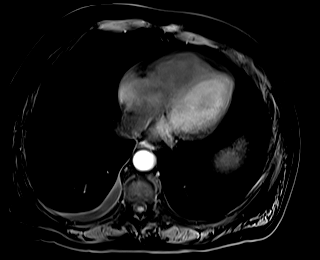

[Series 15: T1 dynamic · axial · 3.0mm · 1.25mm/px · z∈[-141,+96]mm · 4 of 80 slices shown (2 of 8)]
[im 1/80]
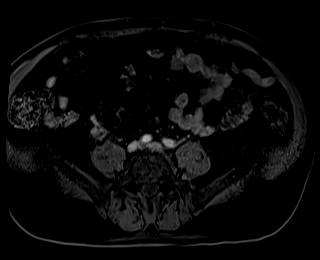
[im 27/80]
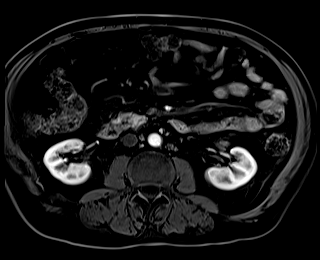
[im 53/80]
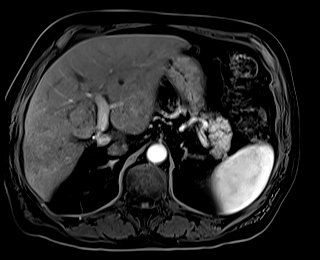
[im 80/80]
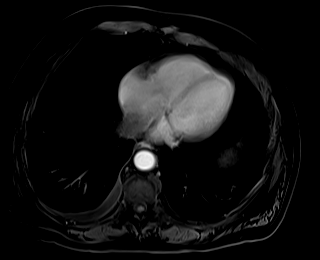

[Series 16: T1 dynamic · axial · 3.0mm · 1.25mm/px · z∈[-141,+96]mm · 4 of 80 slices shown (3 of 8)]
[im 1/80]
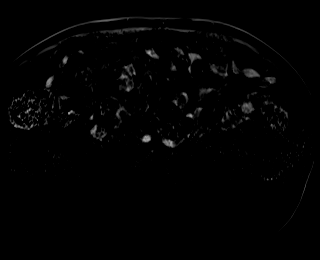
[im 27/80]
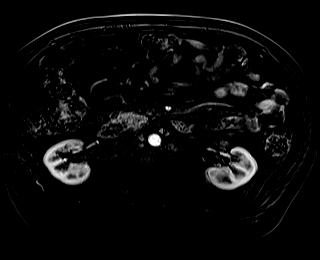
[im 53/80]
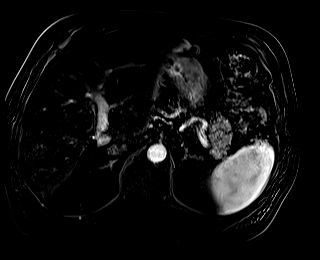
[im 80/80]
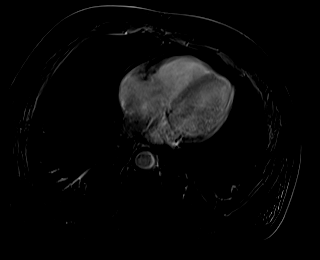

[Series 19: T1 dynamic · axial · 3.0mm · 1.25mm/px · z∈[-141,+96]mm · 4 of 80 slices shown (4 of 8)]
[im 1/80]
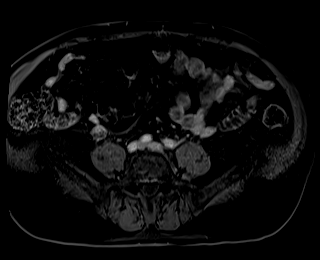
[im 27/80]
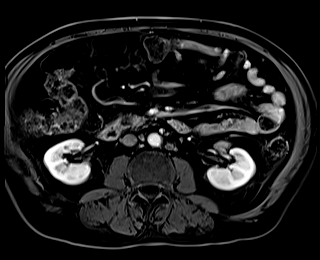
[im 53/80]
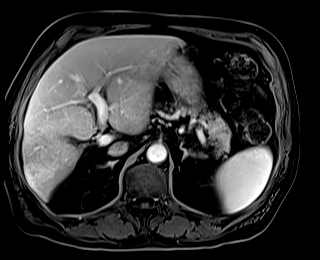
[im 80/80]
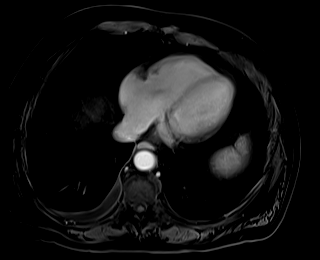

[Series 20: T1 dynamic · axial · 3.0mm · 1.25mm/px · z∈[-141,+96]mm · 4 of 80 slices shown (5 of 8)]
[im 1/80]
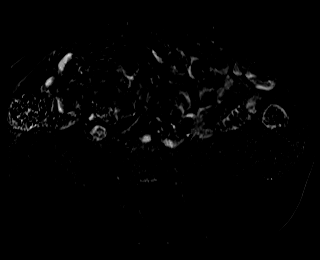
[im 27/80]
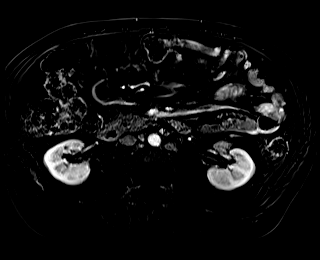
[im 53/80]
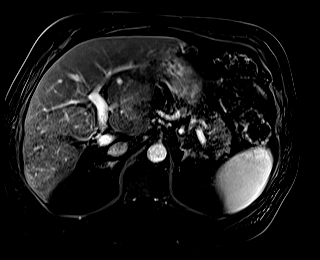
[im 80/80]
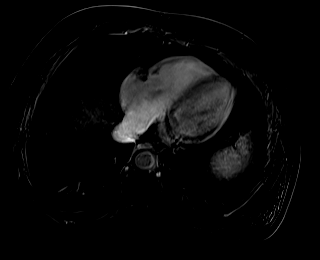

[Series 23: T1 dynamic · axial · 3.0mm · 1.25mm/px · z∈[-141,+96]mm · 4 of 80 slices shown (6 of 8)]
[im 1/80]
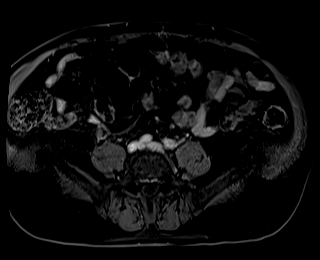
[im 27/80]
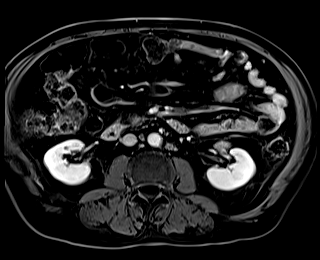
[im 53/80]
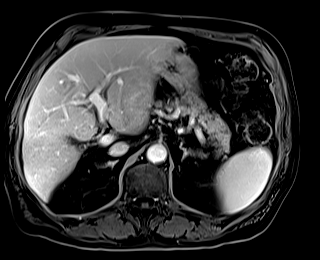
[im 80/80]
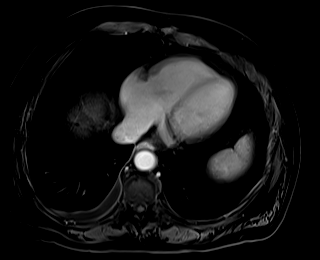

[Series 24: T1 dynamic · axial · 3.0mm · 1.25mm/px · z∈[-141,+96]mm · 4 of 80 slices shown (7 of 8)]
[im 1/80]
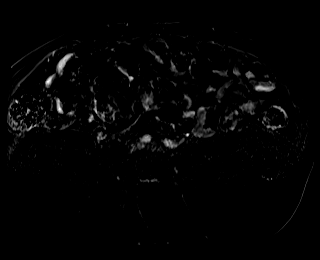
[im 27/80]
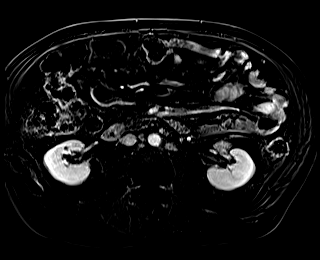
[im 53/80]
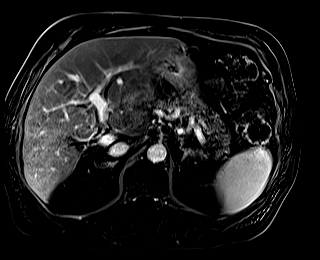
[im 80/80]
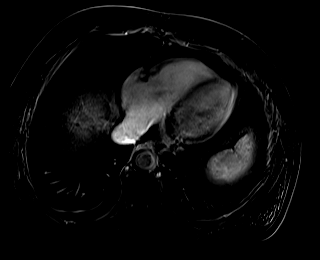

[Series 26: T1 dynamic · coronal · 3.0mm · 1.41mm/px · 4 of 80 slices shown (8 of 8)]
[im 1/80]
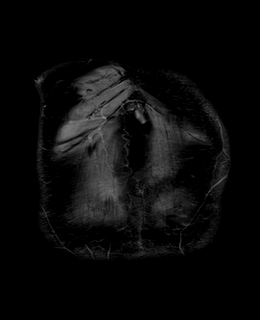
[im 27/80]
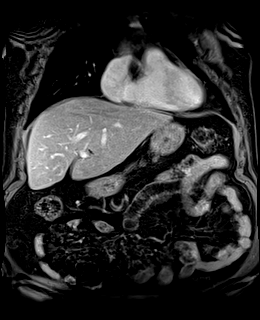
[im 53/80]
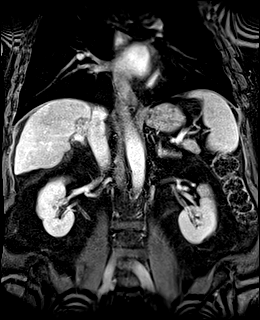
[im 80/80]
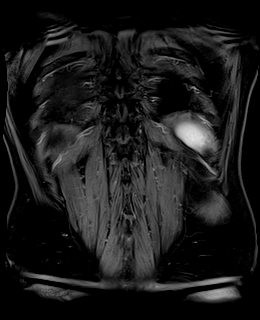

[48 of 48 positions shown; findings below may reference images not displayed]

FINDINGS: Lower chest: New small right pleural effusion.

Hepatobiliary:

Irregular hypovascular mass is again seen in the medial right
hepatic lobe abutting the IVC. This measures 5.3 x 2.7 cm on image
15/19, without significant change since prior study. Nonocclusive
tumor thrombus is again seen in the intrahepatic IVC, without
significant change. Diffuse atrophy and intrahepatic biliary ductal
dilatation is again seen involving the right hepatic lobe, also
without significant change.

A hypovascular lesion with peripheral rim enhancement is seen liver
dome which measures 1.1 cm on image [DATE]. This was not seen on
previous study.

A small gallstone is noted, however there is no evidence of
cholecystitis or dilatation of common bile duct.

Pancreas:  No mass or inflammatory changes.

Spleen:  Within normal limits in size and appearance.

Adrenals/Urinary Tract: No masses identified. A few tiny renal cysts
are noted bilaterally. No evidence of hydronephrosis.

Stomach/Bowel: Visualized portion unremarkable.

Vascular/Lymphatic: No pathologically enlarged lymph nodes
identified. No abdominal aortic aneurysm.

Other: Mild bilateral upper quadrant ascites is new since previous
study.

Musculoskeletal:  No suspicious bone lesions identified.
IMPRESSION: No significant change in size of irregular hypovascular mass in the
medial right hepatic lobe.

New 1.1 cm hypovascular lesion with peripheral rim enhancement in
liver dome, suspicious for metastatic disease.

New small right pleural effusion and mild ascites.

Cholelithiasis, without evidence of cholecystitis or biliary
dilatation.

## 2021-03-16 MED ORDER — GADOBUTROL 1 MMOL/ML IV SOLN
10.0000 mL | Freq: Once | INTRAVENOUS | Status: AC | PRN
  Administered 2021-03-16: 10 mL via INTRAVENOUS

## 2021-03-16 NOTE — Progress Notes (Signed)
Brandon Sherman   Telephone:(336) [phone] Fax:(336) [phone]   Clinic Follow up Note   Patient Care Team: [name], PA as PCP - General (Family Medicine) Lorretta Harp, MD as PCP - Cardiology (Cardiology) [name], MD as Consulting Physician (General Surgery) [name], [name], MD as Consulting Physician (Gastroenterology) [name], MD as Consulting Physician (Hematology) Lorretta Harp, MD as Consulting Physician (Cardiology)  Date of Service:  03/21/2021  CHIEF COMPLAINT: F/u cholangiocarcinoma  SUMMARY OF ONCOLOGIC HISTORY: Oncology History Overview Note  Cancer Staging Intrahepatic cholangiocarcinoma ([place]) Staging form: Intrahepatic Bile Duct, AJCC 8th Edition - Clinical stage from 08/19/2019: Stage II (cT2, cN0, cM0) - Signed by [name], MD on 08/19/2019    Intrahepatic cholangiocarcinoma ([place])  06/28/2019 Imaging   CT AP W Contrast 06/28/19  IMPRESSION: 1. Heterogeneous hypodensity posteriorly in the right hepatic lobe and potentially extending into the caudate lobe suspicious for a mass. There is felt to be truncation of branches of the portal vein in this vicinity and some narrowing of the hepatic vein, as well as triangular-shaped regions of abnormal hypoenhancement posteriorly in the right hepatic lobe likely representing downstream vascular effects. Cannot exclude malignancy such as cholangiocarcinoma or hepatocellular carcinoma, and follow up hepatic protocol MRI with and without contrast is recommended to further characterize. 2. 4 mm right middle lobe pulmonary nodule is likely benign but may merit surveillance. 3. Cholelithiasis. 4.  Aortic Atherosclerosis (ICD10-I70.0). 5. Prostatomegaly. 6. Mild impingement at L3-4 and L4-5.   07/31/2019 Imaging   MRI Liver 07/31/19 IMPRESSION: 1. 7.3 cm in long axis mass in the right hepatic lobe spanning into the caudate lobe, high suspicion for malignancy such as hepatocellular carcinoma  or cholangiocarcinoma. Suspected effacement or occlusion of the right hepatic vein and posterior branches of the right portal vein. Two smaller tumor nodules along the posterior periphery of the dominant mass. Tissue diagnosis is recommended. 2. No findings of pathologic adenopathy or distant metastatic spread. 3. 9 mm gallstone in the gallbladder. There is mild gallbladder wall thickening which may be from nondistention, correlate clinically in assessing for cholecystitis. 4.  Aortic Atherosclerosis (ICD10-I70.0). 5. Mild diffuse hepatic steatosis.   08/11/2019 Initial Biopsy   DIAGNOSIS: 08/11/19  A. LIVER, RIGHT, BIOPSY:  - Adenocarcinoma.   08/18/2019 Imaging   CT Chest 08/18/19  IMPRESSION: 1. Multiple pulmonary nodules largest at approximately 7 mm in the right lower lobe, nonspecific but concerning given findings in the liver. 2. No signs of definitive metastatic disease, also with mildly enlarged upper abdominal lymph nodes as discussed.   Aortic Atherosclerosis (ICD10-I70.0).   08/19/2019 Initial Diagnosis   Intrahepatic cholangiocarcinoma ([place])   08/19/2019 Cancer Staging   Staging form: Intrahepatic Bile Duct, AJCC 8th Edition - Clinical stage from 08/19/2019: Stage II (cT2, cN0, cM0) - Signed by [name], MD on 08/19/2019   09/29/2019 - 07/19/2020 Chemotherapy   Cisplatin and Gemcitabine 2 weeks on/1 week off starting 09/29/19. Cisplatin held from cycle 9 (03/23/20) due to fluid status/Afib. He is now on maintenance Gemcitabine. Held after 07/19/20 to proceed with liver target therapy.     10/09/2019 Imaging   CT AP IMPRESSION: 1. The dominant right hepatic lobe mass is minimally reduced in size compared to prior exams, currently measuring 6.2 by 5.3 cm, previously 6.2 by 5.6 cm. However, there is a new small hypodense lesion centrally in the right hepatic lobe which is suspicious for a new small focus of tumor. Accordingly this is an overall mixed appearance. 2.  Continued  hypoenhancement in the liver downstream of the tumor likely attributable to narrowing or occlusion of the right hepatic vein by the tumor. By virtue of its location the tumor wraps around the intrahepatic portion of the IVC. 3. 4 mm right middle lobe pulmonary nodule, stable compared to earliest available comparison of 07/18/2019. Surveillance of the patient's pulmonary nodules is recommended. 4. Other imaging findings of potential clinical significance: Coronary atherosclerosis. Cholelithiasis. Prominent stool throughout the colon favors constipation. Moderate prostatomegaly with heterogeneous enhancement of the prostate gland. Lumbar spondylosis and degenerative disc disease causing mild bilateral foraminal impingement at L3-4 and L4-5.   Aortic Atherosclerosis (ICD10-I70.0).   12/24/2019 Imaging   CT CAP W Contrast  IMPRESSION: 1. Right hepatic lobe mass and adjacent right hepatic lobe nodules appear grossly stable. No evidence of distant metastatic disease. 2. Continued stability of small pulmonary nodules. Recommend attention on follow-up. 3. Cholelithiasis. 4. Enlarged prostate. 5. Aortic atherosclerosis (ICD10-I70.0). Coronary artery calcification.   02/23/2020 Procedure   He had PAC placed on 02/23/20.    04/08/2020 Imaging   CT CAP  IMPRESSION: 1. Stable or minimally decreased size of a very ill-defined, hypodense and somewhat retractile appearing mass of the central right lobe of the liver abutting the inferior vena cava and right portal vein, measuring approximately 5.7 x 5.0 cm, previously 6.2 x 5.2 cm when measured similarly. Findings are consistent with stable or minimally improved cholangiocarcinoma. 2. Small hypodense nodules of the right lobe identified on prior examination are poorly appreciated on this single phase contrast examination although not grossly changed. Attention on follow-up. 3. There are new, moderate bilateral pleural effusions and associated atelectasis  or consolidation as well as a new small pericardial effusion, nonspecific although generally concerning and suspicious for malignant effusions. There is no directly visualized pleural nodularity. 4. Multiple small pulmonary nodules are stable.  No new nodules. 5. Coronary artery disease. Aortic Atherosclerosis (ICD10-I70.0).   07/06/2020 Imaging   MRI ABDIMPRESSION: 1. Interval decrease in size of the right hepatic lobe lesion in the medial aspect of the segment 7. Findings would suggest a good response to treatment with some contraction of the tumor. 2. No new hepatic lesions. No abdominal adenopathy or metastatic disease. 3. Stable mild intrahepatic biliary dilatation in the right hepatic lobe distal to the lesion. 4. Somewhat tortuous and almost beaded appearance of the hepatic and portal vein radicles. Findings could be radiation related. 5. Cholelithiasis.  CT chest wo contrast IMPRESSION: 1. Persistent/stable moderate-sized bilateral pleural effusions with overlying atelectasis. 2. Stable small right pulmonary nodules. No new or progressive findings. Recommend continued surveillance. 3. No mediastinal or hilar mass or adenopathy. 4. Stable advanced three-vessel coronary artery calcifications. 5. Cholelithiasis. Aortic Atherosclerosis (ICD10-I70.0)   08/11/2020 Procedure   Y90 on 08/11/20 and 08/26/20 with Dr Laurence Ferrari    11/30/2020 Imaging   MRI Abdomen  IMPRESSION: 1. Today's study demonstrates progression of disease with enlarging central lesion in the right lobe of the liver involving portions of segments 7 and 8, now with evidence of some tumor thrombus extension into the intrahepatic portion of the inferior vena cava. This is associated with increasing intrahepatic biliary ductal dilatation in segment 7 of the liver. 2. In addition, there is some subtle hyperenhancement of the distal common bile duct at and immediately proximally to the level of the ampulla. There is  also some subtle delayed enhancement around this region in the pancreatic head. This is of uncertain etiology and significance, but may be inflammatory and currently is  not associated with proximal common bile duct dilatation. However, close attention on follow-up imaging is recommended. 3. Cholelithiasis without evidence of acute cholecystitis at this time. 4. Aortic atherosclerosis.     12/24/2020 - 03/21/2021 Chemotherapy   Restart Gemcitabine 2 weeks on/1 week off given disease progression beginning 12/24/20. Discontinued after 03/21/21 due to disease progression in liver.     03/16/2021 Imaging   MRI abdomen  IMPRESSION: No significant change in size of irregular hypovascular mass in the medial right hepatic lobe.   New 1.1 cm hypovascular lesion with peripheral rim enhancement in liver dome, suspicious for metastatic disease.   New small right pleural effusion and mild ascites.   Cholelithiasis, without evidence of cholecystitis or biliary dilatation.    Chemotherapy   Second-line FOLFOX q2weeks starting in 2 weeks.        CURRENT THERAPY:  RestartedGemcitabine 2 weeks on/1 week off starting 12/24/20. Discontinued after 03/21/21 due to disease progression in liver.  Second-line FOLFOX q2weeks starting in 2 weeks.  Additional Durvalumab (Imfinzi) q3-4weeks PENDING   INTERVAL HISTORY:  Brandon Sherman is here for a follow up. He was last seen by me 02/28/21. He presents to the clinic with his wife. He notes he has known RUQ and right back pain that is mild and intermittent. This is not new and occurs some days. This pain will last a few minutes at a time. I reviewed his medication list with him. He notes he is only taking oral potassium once a day. He denies nausea and BM is normal. His wife notes he will run fever of 99-101F after 1-3 days after chemo infusion. Tylenol helps this.    REVIEW OF SYSTEMS:   Constitutional: Denies fevers, chills or abnormal weight loss Eyes:  Denies blurriness of vision Ears, nose, mouth, throat, and face: Denies mucositis or sore throat Respiratory: Denies cough, dyspnea or wheezes Cardiovascular: Denies palpitation, chest discomfort or lower extremity swelling Gastrointestinal:  Denies nausea, heartburn or change in bowel habits Skin: Denies abnormal skin rashes Lymphatics: Denies new lymphadenopathy or easy bruising Neurological:Denies numbness, tingling or new weaknesses Behavioral/Psych: Mood is stable, no new changes  All other systems were reviewed with the patient and are negative.  MEDICAL HISTORY:  Past Medical History:  Diagnosis Date  . Arthritis   . Diabetes ([place])   . GERD (gastroesophageal reflux disease)   . Hyperlipidemia   . Hypertension   . Intrahepatic cholangiocarcinoma ([place])     SURGICAL HISTORY: Past Surgical History:  Procedure Laterality Date  . APPENDECTOMY  1980  . CARDIOVERSION N/A 04/27/2020   Procedure: CARDIOVERSION;  Surgeon: Dorothy Spark, MD;  Location: [facility] ENDOSCOPY;  Service: Cardiovascular;  Laterality: N/A;  . CARDIOVERSION N/A 05/19/2020   Procedure: CARDIOVERSION;  Surgeon: Elouise Munroe, MD;  Location: [facility] ENDOSCOPY;  Service: Cardiovascular;  Laterality: N/A;  . COLONOSCOPY    . IR 3D INDEPENDENT WKST  08/11/2020  . IR 3D INDEPENDENT WKST  08/11/2020  . IR ANGIOGRAM SELECTIVE EACH ADDITIONAL VESSEL  08/11/2020  . IR ANGIOGRAM SELECTIVE EACH ADDITIONAL VESSEL  08/11/2020  . IR ANGIOGRAM SELECTIVE EACH ADDITIONAL VESSEL  08/11/2020  . IR ANGIOGRAM SELECTIVE EACH ADDITIONAL VESSEL  08/11/2020  . IR ANGIOGRAM SELECTIVE EACH ADDITIONAL VESSEL  08/11/2020  . IR ANGIOGRAM SELECTIVE EACH ADDITIONAL VESSEL  08/11/2020  . IR ANGIOGRAM SELECTIVE EACH ADDITIONAL VESSEL  08/26/2020  . IR ANGIOGRAM SELECTIVE EACH ADDITIONAL VESSEL  08/26/2020  . IR ANGIOGRAM VISCERAL SELECTIVE  08/11/2020  . IR ANGIOGRAM VISCERAL  SELECTIVE  08/26/2020  . IR EMBO ARTERIAL NOT HEMORR HEMANG INC GUIDE  ROADMAPPING  08/11/2020  . IR EMBO TUMOR ORGAN ISCHEMIA INFARCT INC GUIDE ROADMAPPING  08/26/2020  . IR IMAGING GUIDED PORT INSERTION  02/23/2020  . IR RADIOLOGIST EVAL & MGMT  07/14/2020  . IR RADIOLOGIST EVAL & MGMT  09/30/2020  . IR RADIOLOGIST EVAL & MGMT  12/01/2020  . IR US GUIDE VASC ACCESS LEFT  08/26/2020  . IR US GUIDE VASC ACCESS RIGHT  08/11/2020    I have reviewed the social history and family history with the patient and they are unchanged from previous note.  ALLERGIES:  has No Known Allergies.  MEDICATIONS:  Current Outpatient Medications  Medication Sig Dispense Refill  . allopurinol (ZYLOPRIM) 100 MG tablet Take 100 mg by mouth daily.    Marland Kitchen apixaban (ELIQUIS) 5 MG TABS tablet Take 1 tablet (5 mg total) by mouth 2 (two) times daily. 180 tablet 3  . colchicine 0.6 MG tablet Take 1 tablet (0.6 mg total) by mouth daily as needed (gout flare). Take daily for 3 days, then as needed for gout flare 30 tablet 0  . diltiazem (CARDIZEM CD) 240 MG 24 hr capsule Take 1 capsule (240 mg total) by mouth daily. 90 capsule 3  . fenofibrate (TRICOR) 145 MG tablet Take 145 mg by mouth daily.    . finasteride (PROSCAR) 5 MG tablet Take 5 mg by mouth daily.    . furosemide (LASIX) 40 MG tablet Take 40 mg twice a day 90 tablet 3  . gabapentin (NEURONTIN) 100 MG capsule Take 100 mg by mouth at bedtime.    . hydrALAZINE (APRESOLINE) 50 MG tablet TAKE 2 TABLETS BY MOUTH IN THE MORNING, 1 TABLET AT LUNCH AND 2 IN THE EVENING 450 tablet 1  . hydrALAZINE (APRESOLINE) 50 MG tablet Take 50 mg by mouth 2 (two) times daily.    Marland Kitchen KLOR-CON M20 20 MEQ tablet TAKE 1 TABLET BY MOUTH TWICE A DAY 60 tablet 1  . KLOR-CON M20 20 MEQ tablet TAKE 1 TABLET BY MOUTH TWICE A DAY 180 tablet 1  . lidocaine-prilocaine (EMLA) cream Apply 1 application topically as needed. 30 g 3  . magnesium oxide (MAG-OX) 400 MG tablet Take 1 tablet (400 mg total) by mouth daily. 30 tablet 0  . metoprolol succinate (TOPROL XL) 25 MG 24 hr  tablet Take 1 tablet (25 mg total) by mouth daily. 90 tablet 3  . ondansetron (ZOFRAN) 8 MG tablet TAKE 1 TABLET BY MOUTH TWICE A DAY AS NEEDED START ON 3RD DAY AFTER CHEMOTHERAPY 30 tablet 1  . pantoprazole (PROTONIX) 40 MG tablet TAKE 1 TABLET BY MOUTH TWICE A DAY 180 tablet 2  . pravastatin (PRAVACHOL) 40 MG tablet Take 40 mg by mouth daily.    . sucralfate (CARAFATE) 1 g tablet Take 1 tablet (1 g total) by mouth every 6 (six) hours as needed. Please schedule a yearly follow up for further refills: [phone] 60 tablet 0  . tamsulosin (FLOMAX) 0.4 MG CAPS capsule Take 0.4 mg by mouth daily.     No current facility-administered medications for this visit.    PHYSICAL EXAMINATION: ECOG PERFORMANCE STATUS: 1 - Symptomatic but completely ambulatory  Vitals:   03/21/21 0950  BP: (!) 148/86  Pulse: 74  Resp: 18  Temp: (!) 78.1 F (25.6 C)  SpO2: 100%   Filed Weights   03/21/21 0950  Weight: 218 lb 8 oz (99.1 kg)    Due to COVID19  we will limit examination to appearance. Patient had no complaints.  GENERAL:alert, no distress and comfortable SKIN: skin color normal, no rashes or significant lesions EYES: normal, Conjunctiva are pink and non-injected, sclera clear  NEURO: alert & oriented x 3 with fluent speech   LABORATORY DATA:  I have reviewed the data as listed CBC Latest Ref Rng & Units 03/21/2021 03/07/2021 02/28/2021  WBC 4.0 - 10.5 K/uL 4.9 4.5 5.4  Hemoglobin 13.0 - 17.0 g/dL 10.9(L) 10.7(L) 10.9(L)  Hematocrit 39.0 - 52.0 % 33.1(L) 31.7(L) 33.7(L)  Platelets 150 - 400 K/uL 206 152 229     CMP Latest Ref Rng & Units 03/21/2021 03/07/2021 02/28/2021  Glucose 70 - 99 mg/dL 118(H) 133(H) 117(H)  BUN 8 - 23 mg/dL $Remove'19 17 13  'nAZvwhz$ Creatinine 0.61 - 1.24 mg/dL 1.62(H) 1.34(H) 1.46(H)  Sodium 135 - 145 mmol/L 138 140 140  Potassium 3.5 - 5.1 mmol/L 4.0 3.4(L) 3.7  Chloride 98 - 111 mmol/L 101 104 105  CO2 22 - 32 mmol/L $RemoveB'26 25 23  'uHBcxCQL$ Calcium 8.9 - 10.3 mg/dL 9.7 8.9 9.0  Total Protein  6.5 - 8.1 g/dL 7.0 6.7 6.5  Total Bilirubin 0.3 - 1.2 mg/dL 0.6 0.6 0.5  Alkaline Phos 38 - 126 U/L 66 79 62  AST 15 - 41 U/L 25 44(H) 23  ALT 0 - 44 U/L $Remo'14 31 17      'gAyAk$ RADIOGRAPHIC STUDIES: I have personally reviewed the radiological images as listed and agreed with the findings in the report. No results found.   ASSESSMENT & PLAN:  ABDON PETROSKY is a 75 y.o. male with   1.Intrahepatic cholangiocarcinoma, cT2N0M0,stage II, unresectable,with indeterminate lung nodules -He was diagnosed in 07/2019.CT scans and MRI liver showa large7.3cm mass in the right hepatic lobe whichabutsportal vein.  -He was seen byour local surgeon Dr. Barry Dienes andDr Carlis Abbott at [facility] concluded that cancer is not resectable due to the invasion to portal vein.Given cancer is non-resectable his cancer is likely not curable but still treatable. -He was treated with standardfirst line chemo with IV Cisplatin and Gemcitabine 2 weeks on/1 weekoff beginning 09/29/19.Cisplatin was held since C9 (03/23/20) due to fluid status/AFib. Hecontinued maintenanceGemcitabineuntil 07/19/20. He proceeded with Y90 on 08/11/20 and 08/26/20.  -His MRI Abdomen from 12/11/20 showed progression of diseasein liver with tumor thrombus extension into IVC. Given disease progression, Dr Geroge Baseman does not recommend more Y90 or liver target therapy at this point. -To control his disease I restarted Single agent Gemcitabine 2 weeks on/1 weekoff beginning 12/24/20. -He has IDH-1 mutation, which makes him eligible for an oralIDH inhibitordrugIvosidenib.  -His MRI abdomen from 03/16/21 shows stable known liver mass, new small right pleural effusion and New 1.1 cm hypovascular lesion with peripheral rim enhancement in liver dome, suspicious for metastatic disease. I personally reviewed scan images with patient today. His current regimen is no longer controlling his disease and I will stop after today's infusion.  -He is not a candidate  for cisplatin due to his CKD and CHF.  -I recommend second-line chemo with FOLFOX q2weeks. Goal of chemo is to control his disease and prolong his life.   --Chemotherapy consent: Side effects including but does not limited to, fatigue, nausea, vomiting, diarrhea, hair loss, neuropathy, fluid retention, renal and kidney dysfunction, neutropenic fever, needed for blood transfusion, bleeding, were discussed with patient in great detail. He agrees to proceed. -I also recommend the addition of immunotherapy Durvalumab (Imfinzi) q3-4weeks based on recent TOPAZ-1 clinical trail data. If insurance does not approve,  will apply for free drug replacement.  I reviewed the benefit and the potential side effects, especially pneumonitis, colitis, endocrine disorder, and other autoimmune related side effects.  He agrees to proceed. -The goal of therapy is palliative to prolong his life, and also preserve his quality of life -Labs reviewed and stable. Overall adequate to proceed with last dose Gemcitabine today.  -Will start FOLFOX in 2 weeks. F/u at that time.    2. DM, HTN, HLD, Gout, LE edema -On Metformin, amlodipine, lisinopril., allopurinol, Lasix.Continue to f/u with his PCPand cardiologist. -His PCP has started him on Janumet for his feet burning.  3.AF andCHF -With PAC placement on 02/23/20 he wasfound to haveAfib. Based on [place].He is at moderate risk for stroke. -HeunderwentCardioversion with Dr. Meda Coffee on 04/27/20. After [facility] late 6/2021he had another cardioversion on 05/19/20. He has recovered well. -His Afib is intermittent. He will continue his medications and f/u with cardiologist. -He is on Eliquis  4. Nicotine Use -He never smoked but has been chewing Tobacco for the past 60 years.He no longer drinks alcohol. -Hecurrentlychews 1-2 times a day.Iagainencouraged him toreduce and quit completely.  5.GERDand gastritis, history ofesophageal candidiasis,  DM -Hehad repeated EGDwith Dr. Havery Moros in 07/2019. 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  -Will monitor andmayrepeat EGDin future -He is not currently on DM medication. I encouraged him to watch his diet and reduce sugar.  PLAN: -Scan reviewed, he has disease progression in liver.   -Labs reviewed, adequate to proceed with last dose Gemcitabine today at same dose -Lab, flush, F/u and FOLFOX in 2 weeks.  We will start durvalumab when it is approved by his insurance or replaced by drug assistance program   No problem-specific Assessment & Plan notes found for this encounter.   No orders of the defined types were placed in this encounter.  All questions were answered. The patient knows to call the clinic with any problems, questions or concerns. No barriers to learning was detected. The total time spent in the appointment was 40 minutes.     [name], MD 03/21/2021   I, Joslyn Devon, am acting as scribe for [name], MD.   I have reviewed the above documentation for accuracy and completeness, and I agree with the above.

## 2021-03-18 DIAGNOSIS — C221 Intrahepatic bile duct carcinoma: Secondary | ICD-10-CM | POA: Diagnosis not present

## 2021-03-18 DIAGNOSIS — N182 Chronic kidney disease, stage 2 (mild): Secondary | ICD-10-CM | POA: Diagnosis not present

## 2021-03-18 DIAGNOSIS — K219 Gastro-esophageal reflux disease without esophagitis: Secondary | ICD-10-CM | POA: Diagnosis not present

## 2021-03-18 DIAGNOSIS — E1122 Type 2 diabetes mellitus with diabetic chronic kidney disease: Secondary | ICD-10-CM | POA: Diagnosis not present

## 2021-03-18 DIAGNOSIS — M109 Gout, unspecified: Secondary | ICD-10-CM | POA: Diagnosis not present

## 2021-03-18 DIAGNOSIS — E559 Vitamin D deficiency, unspecified: Secondary | ICD-10-CM | POA: Diagnosis not present

## 2021-03-18 DIAGNOSIS — E785 Hyperlipidemia, unspecified: Secondary | ICD-10-CM | POA: Diagnosis not present

## 2021-03-18 DIAGNOSIS — I129 Hypertensive chronic kidney disease with stage 1 through stage 4 chronic kidney disease, or unspecified chronic kidney disease: Secondary | ICD-10-CM | POA: Diagnosis not present

## 2021-03-18 DIAGNOSIS — Z79899 Other long term (current) drug therapy: Secondary | ICD-10-CM | POA: Diagnosis not present

## 2021-03-18 DIAGNOSIS — I4891 Unspecified atrial fibrillation: Secondary | ICD-10-CM | POA: Diagnosis not present

## 2021-03-18 DIAGNOSIS — I10 Essential (primary) hypertension: Secondary | ICD-10-CM | POA: Diagnosis not present

## 2021-03-21 ENCOUNTER — Encounter: Payer: Self-pay | Admitting: Hematology

## 2021-03-21 ENCOUNTER — Inpatient Hospital Stay: Payer: Medicare HMO | Admitting: Hematology

## 2021-03-21 ENCOUNTER — Inpatient Hospital Stay: Payer: Medicare HMO

## 2021-03-21 ENCOUNTER — Other Ambulatory Visit: Payer: Self-pay

## 2021-03-21 ENCOUNTER — Inpatient Hospital Stay: Payer: Medicare HMO | Attending: Hematology

## 2021-03-21 VITALS — BP 148/86 | HR 74 | Temp 78.1°F | Resp 18 | Wt 218.5 lb

## 2021-03-21 DIAGNOSIS — Z7189 Other specified counseling: Secondary | ICD-10-CM

## 2021-03-21 DIAGNOSIS — Z452 Encounter for adjustment and management of vascular access device: Secondary | ICD-10-CM | POA: Insufficient documentation

## 2021-03-21 DIAGNOSIS — C221 Intrahepatic bile duct carcinoma: Secondary | ICD-10-CM

## 2021-03-21 DIAGNOSIS — Z5112 Encounter for antineoplastic immunotherapy: Secondary | ICD-10-CM | POA: Insufficient documentation

## 2021-03-21 DIAGNOSIS — J90 Pleural effusion, not elsewhere classified: Secondary | ICD-10-CM | POA: Diagnosis not present

## 2021-03-21 DIAGNOSIS — Z5111 Encounter for antineoplastic chemotherapy: Secondary | ICD-10-CM | POA: Diagnosis not present

## 2021-03-21 DIAGNOSIS — Z79899 Other long term (current) drug therapy: Secondary | ICD-10-CM | POA: Insufficient documentation

## 2021-03-21 LAB — CBC WITH DIFFERENTIAL (CANCER CENTER ONLY)
Abs Immature Granulocytes: 0.01 10*3/uL (ref 0.00–0.07)
Basophils Absolute: 0.1 10*3/uL (ref 0.0–0.1)
Basophils Relative: 1 %
Eosinophils Absolute: 0.1 10*3/uL (ref 0.0–0.5)
Eosinophils Relative: 1 %
HCT: 33.1 % — ABNORMAL LOW (ref 39.0–52.0)
Hemoglobin: 10.9 g/dL — ABNORMAL LOW (ref 13.0–17.0)
Immature Granulocytes: 0 %
Lymphocytes Relative: 14 %
Lymphs Abs: 0.7 10*3/uL (ref 0.7–4.0)
MCH: 34.6 pg — ABNORMAL HIGH (ref 26.0–34.0)
MCHC: 32.9 g/dL (ref 30.0–36.0)
MCV: 105.1 fL — ABNORMAL HIGH (ref 80.0–100.0)
Monocytes Absolute: 0.7 10*3/uL (ref 0.1–1.0)
Monocytes Relative: 15 %
Neutro Abs: 3.3 10*3/uL (ref 1.7–7.7)
Neutrophils Relative %: 69 %
Platelet Count: 206 10*3/uL (ref 150–400)
RBC: 3.15 MIL/uL — ABNORMAL LOW (ref 4.22–5.81)
RDW: 18.3 % — ABNORMAL HIGH (ref 11.5–15.5)
WBC Count: 4.9 10*3/uL (ref 4.0–10.5)
nRBC: 0 % (ref 0.0–0.2)

## 2021-03-21 LAB — CMP (CANCER CENTER ONLY)
ALT: 14 U/L (ref 0–44)
AST: 25 U/L (ref 15–41)
Albumin: 3.6 g/dL (ref 3.5–5.0)
Alkaline Phosphatase: 66 U/L (ref 38–126)
Anion gap: 11 (ref 5–15)
BUN: 19 mg/dL (ref 8–23)
CO2: 26 mmol/L (ref 22–32)
Calcium: 9.7 mg/dL (ref 8.9–10.3)
Chloride: 101 mmol/L (ref 98–111)
Creatinine: 1.62 mg/dL — ABNORMAL HIGH (ref 0.61–1.24)
GFR, Estimated: 45 mL/min — ABNORMAL LOW (ref >60)
Glucose, Bld: 118 mg/dL — ABNORMAL HIGH (ref 70–99)
Potassium: 4 mmol/L (ref 3.5–5.1)
Sodium: 138 mmol/L (ref 135–145)
Total Bilirubin: 0.6 mg/dL (ref 0.3–1.2)
Total Protein: 7 g/dL (ref 6.5–8.1)

## 2021-03-21 MED ORDER — HEPARIN SOD (PORK) LOCK FLUSH 100 UNIT/ML IV SOLN
500.0000 [IU] | Freq: Once | INTRAVENOUS | Status: AC | PRN
Start: 2021-03-21 — End: 2021-03-21
  Administered 2021-03-21: 500 [IU]
  Filled 2021-03-21: qty 5

## 2021-03-21 MED ORDER — PROCHLORPERAZINE MALEATE 10 MG PO TABS
ORAL_TABLET | ORAL | Status: AC
  Filled 2021-03-21: qty 1

## 2021-03-21 MED ORDER — SODIUM CHLORIDE 0.9% FLUSH
10.0000 mL | INTRAVENOUS | Status: DC | PRN
  Administered 2021-03-21: 10 mL
  Filled 2021-03-21: qty 10

## 2021-03-21 MED ORDER — PROCHLORPERAZINE MALEATE 10 MG PO TABS
10.0000 mg | ORAL_TABLET | Freq: Once | ORAL | Status: AC
  Administered 2021-03-21: 10 mg via ORAL

## 2021-03-21 MED ORDER — SODIUM CHLORIDE 0.9 % IV SOLN
Freq: Once | INTRAVENOUS | Status: AC
  Filled 2021-03-21: qty 250

## 2021-03-21 MED ORDER — SODIUM CHLORIDE 0.9 % IV SOLN
900.0000 mg/m2 | Freq: Once | INTRAVENOUS | Status: AC
  Administered 2021-03-21: 1938 mg via INTRAVENOUS
  Filled 2021-03-21: qty 50.97

## 2021-03-21 NOTE — Patient Instructions (Signed)
[facility]  Discharge Instructions: Thank you for choosing [facility] to provide your oncology and hematology care.   If you have a lab appointment with the Cancer Center, please go directly to the Cancer Center and check in at the registration area.   Wear comfortable clothing and clothing appropriate for easy access to any Portacath or PICC line.   We strive to give you quality time with your provider. You may need to reschedule your appointment if you arrive late (15 or more minutes).  Arriving late affects you and other patients whose appointments are after yours.  Also, if you miss three or more appointments without notifying the office, you may be dismissed from the clinic at the provider's discretion.      For prescription refill requests, have your pharmacy contact our office and allow 72 hours for refills to be completed.    Today you received the following chemotherapy and/or immunotherapy agents Gemzar      To help prevent nausea and vomiting after your treatment, we encourage you to take your nausea medication as directed.  BELOW ARE SYMPTOMS THAT SHOULD BE REPORTED IMMEDIATELY: *FEVER GREATER THAN 100.4 F (38 C) OR HIGHER *CHILLS OR SWEATING *NAUSEA AND VOMITING THAT IS NOT CONTROLLED WITH YOUR NAUSEA MEDICATION *UNUSUAL SHORTNESS OF BREATH *UNUSUAL BRUISING OR BLEEDING *URINARY PROBLEMS (pain or burning when urinating, or frequent urination) *BOWEL PROBLEMS (unusual diarrhea, constipation, pain near the anus) TENDERNESS IN MOUTH AND THROAT WITH OR WITHOUT PRESENCE OF ULCERS (sore throat, sores in mouth, or a toothache) UNUSUAL RASH, SWELLING OR PAIN  UNUSUAL VAGINAL DISCHARGE OR ITCHING   Items with * indicate a potential emergency and should be followed up as soon as possible or go to the Emergency Department if any problems should occur.  Please show the CHEMOTHERAPY ALERT CARD or IMMUNOTHERAPY ALERT CARD at check-in to the  Emergency Department and triage nurse.  Should you have questions after your visit or need to cancel or reschedule your appointment, please contact [facility]  Dept: [phone]  and follow the prompts.  Office hours are 8:00 a.m. to 4:30 p.m. Monday - Friday. Please note that voicemails left after 4:00 p.m. may not be returned until the following business day.  We are closed weekends and major holidays. You have access to a nurse at all times for urgent questions. Please call the main number to the clinic Dept: [phone] and follow the prompts.   For any non-urgent questions, you may also contact your provider using MyChart. We now offer e-Visits for anyone 18 and older to request care online for non-urgent symptoms. For details visit [email or web address].   Also download the MyChart app! Go to the app store, search "MyChart", open the app, select [facility], and log in with your MyChart username and password.  Due to Covid, a mask is required upon entering the hospital/clinic. If you do not have a mask, one will be given to you upon arrival. For doctor visits, patients may have 1 support person aged 18 or older with them. For treatment visits, patients cannot have anyone with them due to current Covid guidelines and our immunocompromised population.   

## 2021-03-21 NOTE — Progress Notes (Signed)
Per Dr. Feng, okay to treat with elevated SCr. 

## 2021-03-21 NOTE — Progress Notes (Signed)
DISCONTINUE OFF PATHWAY REGIMEN - Other ° ° °OFF00991:Cisplatin 25 mg/m2 D1,8 + Gemcitabine 1,000 mg/m2 D1,8 q21 Days: °  A cycle is every 21 days: °    Gemcitabine  °    Cisplatin  ° °**Always confirm dose/schedule in your pharmacy ordering system** ° °REASON: Disease Progression °PRIOR TREATMENT: Cisplatin 25 mg/m2 D1,8 + Gemcitabine 1,000 mg/m2 D1,8 q21 Days °TREATMENT RESPONSE: Partial Response (PR) ° °START OFF PATHWAY REGIMEN - Other ° ° °OFF01020:mFOLFOX6 (Leucovorin IV D1 + Fluorouracil IV D1/CIV D1,2 + Oxaliplatin IV D1) q14 Days: °  A cycle is every 14 days: °    Oxaliplatin  °    Leucovorin  °    Fluorouracil  °    Fluorouracil  ° °**Always confirm dose/schedule in your pharmacy ordering system** ° °Patient Characteristics: °Intent of Therapy: °Non-Curative / Palliative Intent, Discussed with Patient °

## 2021-03-24 NOTE — Progress Notes (Signed)
..  The following Medication: Imfinzi has been approved thru [employer] as Assistance Program. Enrollment period is 03/24/2021 to 11/19/2021.  Assistance ID: [id number]  Reason for Assistance: Insurance Denial First DOS: 04/03/2021.

## 2021-03-28 ENCOUNTER — Ambulatory Visit: Payer: Medicare HMO

## 2021-03-28 ENCOUNTER — Other Ambulatory Visit: Payer: Self-pay | Admitting: Cardiovascular Disease

## 2021-03-28 ENCOUNTER — Other Ambulatory Visit: Payer: Medicare HMO

## 2021-03-28 NOTE — Telephone Encounter (Signed)
7m, 99.1kg, scr 1.62 03/21/21, lovw/meng 10/06/20

## 2021-03-29 ENCOUNTER — Telehealth: Payer: Self-pay | Admitting: Hematology

## 2021-03-29 NOTE — Telephone Encounter (Signed)
Left message with follow-up appointments per 5/2 los. Gave option to call back to reschedule if needed. 

## 2021-03-30 DIAGNOSIS — N401 Enlarged prostate with lower urinary tract symptoms: Secondary | ICD-10-CM | POA: Diagnosis not present

## 2021-03-30 DIAGNOSIS — R972 Elevated prostate specific antigen [PSA]: Secondary | ICD-10-CM | POA: Diagnosis not present

## 2021-04-01 MED FILL — Dexamethasone Sodium Phosphate Inj 100 MG/10ML: INTRAMUSCULAR | Qty: 1 | Status: AC

## 2021-04-03 NOTE — Progress Notes (Signed)
[place]   [phone] [phone]   Clinic Follow up Note   Patient Care Team: [name], PA as PCP - General (Family Medicine) Lorretta Harp, MD as PCP - Cardiology (Cardiology) [name], MD as Consulting Physician (General Surgery) [name], [name], MD as Consulting Physician (Gastroenterology) [name], MD as Consulting Physician (Hematology) Lorretta Harp, MD as Consulting Physician (Cardiology) 04/04/2021  CHIEF COMPLAINT: Follow up cholangiocarcinoma   SUMMARY OF ONCOLOGIC HISTORY: Oncology History Overview Note  Cancer Staging Intrahepatic cholangiocarcinoma ([place]) Staging form: Intrahepatic Bile Duct, AJCC 8th Edition - Clinical stage from 08/19/2019: Stage II (cT2, cN0, cM0) - Signed by [name], MD on 08/19/2019    Intrahepatic cholangiocarcinoma ([place])  06/28/2019 Imaging   CT AP W Contrast 06/28/19  IMPRESSION: 1. Heterogeneous hypodensity posteriorly in the right hepatic lobe and potentially extending into the caudate lobe suspicious for a mass. There is felt to be truncation of branches of the portal vein in this vicinity and some narrowing of the hepatic vein, as well as triangular-shaped regions of abnormal hypoenhancement posteriorly in the right hepatic lobe likely representing downstream vascular effects. Cannot exclude malignancy such as cholangiocarcinoma or hepatocellular carcinoma, and follow up hepatic protocol MRI with and without contrast is recommended to further characterize. 2. 4 mm right middle lobe pulmonary nodule is likely benign but may merit surveillance. 3. Cholelithiasis. 4.  Aortic Atherosclerosis (ICD10-I70.0). 5. Prostatomegaly. 6. Mild impingement at L3-4 and L4-5.   07/31/2019 Imaging   MRI Liver 07/31/19 IMPRESSION: 1. 7.3 cm in long axis mass in the right hepatic lobe spanning into the caudate lobe, high suspicion for malignancy such as hepatocellular carcinoma or  cholangiocarcinoma. Suspected effacement or occlusion of the right hepatic vein and posterior branches of the right portal vein. Two smaller tumor nodules along the posterior periphery of the dominant mass. Tissue diagnosis is recommended. 2. No findings of pathologic adenopathy or distant metastatic spread. 3. 9 mm gallstone in the gallbladder. There is mild gallbladder wall thickening which may be from nondistention, correlate clinically in assessing for cholecystitis. 4.  Aortic Atherosclerosis (ICD10-I70.0). 5. Mild diffuse hepatic steatosis.   08/11/2019 Initial Biopsy   DIAGNOSIS: 08/11/19  A. LIVER, RIGHT, BIOPSY:  - Adenocarcinoma.   08/18/2019 Imaging   CT Chest 08/18/19  IMPRESSION: 1. Multiple pulmonary nodules largest at approximately 7 mm in the right lower lobe, nonspecific but concerning given findings in the liver. 2. No signs of definitive metastatic disease, also with mildly enlarged upper abdominal lymph nodes as discussed.   Aortic Atherosclerosis (ICD10-I70.0).   08/19/2019 Initial Diagnosis   Intrahepatic cholangiocarcinoma ([place])   08/19/2019 Cancer Staging   Staging form: Intrahepatic Bile Duct, AJCC 8th Edition - Clinical stage from 08/19/2019: Stage II (cT2, cN0, cM0) - Signed by [name], MD on 08/19/2019   09/29/2019 - 07/19/2020 Chemotherapy   Cisplatin and Gemcitabine 2 weeks on/1 week off starting 09/29/19. Cisplatin held from cycle 9 (03/23/20) due to fluid status/Afib. He is now on maintenance Gemcitabine. Held after 07/19/20 to proceed with liver target therapy.     10/09/2019 Imaging   CT AP IMPRESSION: 1. The dominant right hepatic lobe mass is minimally reduced in size compared to prior exams, currently measuring 6.2 by 5.3 cm, previously 6.2 by 5.6 cm. However, there is a new small hypodense lesion centrally in the right hepatic lobe which is suspicious for a new small focus of tumor. Accordingly this is an overall mixed appearance. 2. Continued  hypoenhancement  in the liver downstream of the tumor likely attributable to narrowing or occlusion of the right hepatic vein by the tumor. By virtue of its location the tumor wraps around the intrahepatic portion of the IVC. 3. 4 mm right middle lobe pulmonary nodule, stable compared to earliest available comparison of 07/18/2019. Surveillance of the patient's pulmonary nodules is recommended. 4. Other imaging findings of potential clinical significance: Coronary atherosclerosis. Cholelithiasis. Prominent stool throughout the colon favors constipation. Moderate prostatomegaly with heterogeneous enhancement of the prostate gland. Lumbar spondylosis and degenerative disc disease causing mild bilateral foraminal impingement at L3-4 and L4-5.   Aortic Atherosclerosis (ICD10-I70.0).   12/24/2019 Imaging   CT CAP W Contrast  IMPRESSION: 1. Right hepatic lobe mass and adjacent right hepatic lobe nodules appear grossly stable. No evidence of distant metastatic disease. 2. Continued stability of small pulmonary nodules. Recommend attention on follow-up. 3. Cholelithiasis. 4. Enlarged prostate. 5. Aortic atherosclerosis (ICD10-I70.0). Coronary artery calcification.   02/23/2020 Procedure   He had PAC placed on 02/23/20.    04/08/2020 Imaging   CT CAP  IMPRESSION: 1. Stable or minimally decreased size of a very ill-defined, hypodense and somewhat retractile appearing mass of the central right lobe of the liver abutting the inferior vena cava and right portal vein, measuring approximately 5.7 x 5.0 cm, previously 6.2 x 5.2 cm when measured similarly. Findings are consistent with stable or minimally improved cholangiocarcinoma. 2. Small hypodense nodules of the right lobe identified on prior examination are poorly appreciated on this single phase contrast examination although not grossly changed. Attention on follow-up. 3. There are new, moderate bilateral pleural effusions and associated atelectasis  or consolidation as well as a new small pericardial effusion, nonspecific although generally concerning and suspicious for malignant effusions. There is no directly visualized pleural nodularity. 4. Multiple small pulmonary nodules are stable.  No new nodules. 5. Coronary artery disease. Aortic Atherosclerosis (ICD10-I70.0).   07/06/2020 Imaging   MRI ABDIMPRESSION: 1. Interval decrease in size of the right hepatic lobe lesion in the medial aspect of the segment 7. Findings would suggest a good response to treatment with some contraction of the tumor. 2. No new hepatic lesions. No abdominal adenopathy or metastatic disease. 3. Stable mild intrahepatic biliary dilatation in the right hepatic lobe distal to the lesion. 4. Somewhat tortuous and almost beaded appearance of the hepatic and portal vein radicles. Findings could be radiation related. 5. Cholelithiasis.  CT chest wo contrast IMPRESSION: 1. Persistent/stable moderate-sized bilateral pleural effusions with overlying atelectasis. 2. Stable small right pulmonary nodules. No new or progressive findings. Recommend continued surveillance. 3. No mediastinal or hilar mass or adenopathy. 4. Stable advanced three-vessel coronary artery calcifications. 5. Cholelithiasis. Aortic Atherosclerosis (ICD10-I70.0)   08/11/2020 Procedure   Y90 on 08/11/20 and 08/26/20 with Dr Laurence Ferrari    11/30/2020 Imaging   MRI Abdomen  IMPRESSION: 1. Today's study demonstrates progression of disease with enlarging central lesion in the right lobe of the liver involving portions of segments 7 and 8, now with evidence of some tumor thrombus extension into the intrahepatic portion of the inferior vena cava. This is associated with increasing intrahepatic biliary ductal dilatation in segment 7 of the liver. 2. In addition, there is some subtle hyperenhancement of the distal common bile duct at and immediately proximally to the level of the ampulla. There is  also some subtle delayed enhancement around this region in the pancreatic head. This is of uncertain etiology and significance, but may be inflammatory and currently is not associated with  proximal common bile duct dilatation. However, close attention on follow-up imaging is recommended. 3. Cholelithiasis without evidence of acute cholecystitis at this time. 4. Aortic atherosclerosis.     12/24/2020 - 03/21/2021 Chemotherapy   Restart Gemcitabine 2 weeks on/1 week off given disease progression beginning 12/24/20. Discontinued after 03/21/21 due to disease progression in liver.     03/16/2021 Imaging   MRI abdomen  IMPRESSION: No significant change in size of irregular hypovascular mass in the medial right hepatic lobe.   New 1.1 cm hypovascular lesion with peripheral rim enhancement in liver dome, suspicious for metastatic disease.   New small right pleural effusion and mild ascites.   Cholelithiasis, without evidence of cholecystitis or biliary dilatation.    Chemotherapy   Second-line FOLFOX q2weeks starting in 2 weeks.     04/04/2021 -  Chemotherapy    Patient is on Treatment Plan: COLORECTAL FOLFOX Q14D      04/05/2021 -  Chemotherapy    Patient is on Treatment Plan: COLORECTAL FOLFOX Q14D   Patient is on Antibody Plan: BLADDER DURVALUMAB Q14D      CURRENT THERAPY:  1. RestartedGemcitabine 2 weeks on/1 week off starting 12/24/20. Discontinued after 03/21/21 due to disease progression in liver.  2. Second-line FOLFOX q2weeks plus Durvalumab (Imfinzi) q3-4weeks PENDING to start 04/05/21   INTERVAL HISTORY: Mr. Zehnder returns for follow up as scheduled. He was last seen 03/21/21 and received gemcitabine. He is here for evaluation prior to starting second line FOLFOX and Imfinzi.  After gemcitabine he would run low-grade fevers 99-101 for a day, resolved with Tylenol.  Denies recent fevers.  He is feeling well. Continues working in the yard and remaining active at home.  Appetite is  adequate. Neuropathy much better on gabapentin, intermittent now. Denies chills, cough, chest pain, dyspnea, pain, n/v/c/d.   All other systems were reviewed with the patient and are negative.  MEDICAL HISTORY:  Past Medical History:  Diagnosis Date  . Arthritis   . Diabetes ([place])   . GERD (gastroesophageal reflux disease)   . Hyperlipidemia   . Hypertension   . Intrahepatic cholangiocarcinoma ([place])     SURGICAL HISTORY: Past Surgical History:  Procedure Laterality Date  . APPENDECTOMY  1980  . CARDIOVERSION N/A 04/27/2020   Procedure: CARDIOVERSION;  Surgeon: Dorothy Spark, MD;  Location: [facility] ENDOSCOPY;  Service: Cardiovascular;  Laterality: N/A;  . CARDIOVERSION N/A 05/19/2020   Procedure: CARDIOVERSION;  Surgeon: Elouise Munroe, MD;  Location: [facility] ENDOSCOPY;  Service: Cardiovascular;  Laterality: N/A;  . COLONOSCOPY    . IR 3D INDEPENDENT WKST  08/11/2020  . IR 3D INDEPENDENT WKST  08/11/2020  . IR ANGIOGRAM SELECTIVE EACH ADDITIONAL VESSEL  08/11/2020  . IR ANGIOGRAM SELECTIVE EACH ADDITIONAL VESSEL  08/11/2020  . IR ANGIOGRAM SELECTIVE EACH ADDITIONAL VESSEL  08/11/2020  . IR ANGIOGRAM SELECTIVE EACH ADDITIONAL VESSEL  08/11/2020  . IR ANGIOGRAM SELECTIVE EACH ADDITIONAL VESSEL  08/11/2020  . IR ANGIOGRAM SELECTIVE EACH ADDITIONAL VESSEL  08/11/2020  . IR ANGIOGRAM SELECTIVE EACH ADDITIONAL VESSEL  08/26/2020  . IR ANGIOGRAM SELECTIVE EACH ADDITIONAL VESSEL  08/26/2020  . IR ANGIOGRAM VISCERAL SELECTIVE  08/11/2020  . IR ANGIOGRAM VISCERAL SELECTIVE  08/26/2020  . IR EMBO ARTERIAL NOT HEMORR HEMANG INC GUIDE ROADMAPPING  08/11/2020  . IR EMBO TUMOR ORGAN ISCHEMIA INFARCT INC GUIDE ROADMAPPING  08/26/2020  . IR IMAGING GUIDED PORT INSERTION  02/23/2020  . IR RADIOLOGIST EVAL & MGMT  07/14/2020  . IR RADIOLOGIST EVAL & MGMT  09/30/2020  .  IR RADIOLOGIST EVAL & MGMT  12/01/2020  . IR US GUIDE VASC ACCESS LEFT  08/26/2020  . IR US GUIDE VASC ACCESS RIGHT  08/11/2020    I have reviewed  the social history and family history with the patient and they are unchanged from previous note.  ALLERGIES:  has No Known Allergies.  MEDICATIONS:  Current Outpatient Medications  Medication Sig Dispense Refill  . allopurinol (ZYLOPRIM) 100 MG tablet Take 100 mg by mouth daily.    Marland Kitchen amLODipine (NORVASC) 5 MG tablet     . colchicine 0.6 MG tablet Take 1 tablet (0.6 mg total) by mouth daily as needed (gout flare). Take daily for 3 days, then as needed for gout flare 30 tablet 0  . ELIQUIS 5 MG TABS tablet TAKE 1 TABLET TWICE DAILY 180 tablet 1  . fenofibrate (TRICOR) 145 MG tablet Take 145 mg by mouth daily.    . finasteride (PROSCAR) 5 MG tablet Take 5 mg by mouth daily.    . furosemide (LASIX) 40 MG tablet Take 40 mg twice a day 90 tablet 3  . gabapentin (NEURONTIN) 100 MG capsule Take 100 mg by mouth at bedtime.    . hydrALAZINE (APRESOLINE) 50 MG tablet TAKE 2 TABLETS BY MOUTH IN THE MORNING, 1 TABLET AT LUNCH AND 2 IN THE EVENING 450 tablet 1  . hydrALAZINE (APRESOLINE) 50 MG tablet Take 50 mg by mouth 2 (two) times daily.    Marland Kitchen KLOR-CON M20 20 MEQ tablet TAKE 1 TABLET BY MOUTH TWICE A DAY 60 tablet 1  . KLOR-CON M20 20 MEQ tablet TAKE 1 TABLET BY MOUTH TWICE A DAY 180 tablet 1  . metoprolol succinate (TOPROL XL) 25 MG 24 hr tablet Take 1 tablet (25 mg total) by mouth daily. 90 tablet 3  . metoprolol tartrate (LOPRESSOR) 50 MG tablet     . pantoprazole (PROTONIX) 40 MG tablet TAKE 1 TABLET BY MOUTH TWICE A DAY 180 tablet 2  . pravastatin (PRAVACHOL) 40 MG tablet Take 40 mg by mouth daily.    . sucralfate (CARAFATE) 1 g tablet Take 1 tablet (1 g total) by mouth every 6 (six) hours as needed. Please schedule a yearly follow up for further refills: [phone] 60 tablet 0  . tamsulosin (FLOMAX) 0.4 MG CAPS capsule Take 0.4 mg by mouth daily.    Marland Kitchen diltiazem (CARDIZEM CD) 240 MG 24 hr capsule Take 1 capsule (240 mg total) by mouth daily. 90 capsule 3  . lidocaine-prilocaine (EMLA) cream  Apply 1 application topically as needed. 30 g 3  . magnesium oxide (MAG-OX) 400 MG tablet Take 1 tablet (400 mg total) by mouth daily. 30 tablet 0   No current facility-administered medications for this visit.    PHYSICAL EXAMINATION: ECOG PERFORMANCE STATUS: 1 - Symptomatic but completely ambulatory  Vitals:   04/04/21 0941  BP: 139/71  Pulse: 73  Resp: 20  Temp: (!) 97 F (36.1 C)  SpO2: 100%   Filed Weights   04/04/21 0941  Weight: 227 lb 1.6 oz (103 kg)    GENERAL:alert, no distress and comfortable SKIN: no rash. Ecchymoses to right forearm EYES:  sclera clear LUNGS:  normal breathing effort HEART:  no lower extremity edema NEURO: alert & oriented x 3 with fluent speech, no focal motor/sensory deficits PAC without erythema   LABORATORY DATA:  I have reviewed the data as listed CBC Latest Ref Rng & Units 04/04/2021 03/21/2021 03/07/2021  WBC 4.0 - 10.5 K/uL 5.8 4.9 4.5  Hemoglobin  13.0 - 17.0 g/dL 10.7(L) 10.9(L) 10.7(L)  Hematocrit 39.0 - 52.0 % 33.6(L) 33.1(L) 31.7(L)  Platelets 150 - 400 K/uL 171 206 152     CMP Latest Ref Rng & Units 04/04/2021 03/21/2021 03/07/2021  Glucose 70 - 99 mg/dL 114(H) 118(H) 133(H)  BUN 8 - 23 mg/dL _0 Creatinine 0.61 - 1.24 mg/dL 1.42(H) 1.62(H) 1.34(H)  Sodium 135 - 145 mmol/L 139 138 140  Potassium 3.5 - 5.1 mmol/L 4.2 4.0 3.4(L)  Chloride 98 - 111 mmol/L 106 101 104  CO2 22 - 32 mmol/L _1 Calcium 8.9 - 10.3 mg/dL 9.1 9.7 8.9  Total Protein 6.5 - 8.1 g/dL 6.8 7.0 6.7  Total Bilirubin 0.3 - 1.2 mg/dL 0.6 0.6 0.6  Alkaline Phos 38 - 126 U/L 49 66 79  AST 15 - 41 U/L 27 25 44(H)  ALT 0 - 44 U/L _2 RADIOGRAPHIC STUDIES: I have personally reviewed the radiological images as listed and agreed with the findings in the report. No results found.   ASSESSMENT & PLAN: Givanni Staron Sizemoreis a 74y.o.malewith    1.Intrahepatic cholangiocarcinoma, cT2N0Mx,unresectable,with indeterminate lung  nodules -Diagnosed in9/2020.CT scans and MRI liver showa large7.3cm mass in the right hepatic lobe whichabutsportal vein.Both our local surgeon Dr. Barry Dienes andDr Carlis Abbott at [facility] concluded that cancer is not resectable, and therefore not likely curable,due to the invasion to portal vein. -He beganpalliativesystemic treatment standardfirst line chemo with IV Cisplatin and Gemcitabine 2 weeks on/1 week off.Started 09/29/19, tolerating well cisplatin was held with cycle 9 on 03/23/2020 due to fluid status/A. fib, now on maintenance gemcitabine alone -His FO results showed MSI stable disease, IDH1 mutations he may benefit from IDH inhibitor in the future -he went to single agent gemcitabine q2 weeks on 12/24/20 until disease progression in liver on 03/16/21 MRI -Starting second line FOLFOX plus durvalumab 04/05/21  2. Nausea, Low food intakeand weight loss -With pain from GERD and nausea he initially lost 30 pounds. -followed by dietician -weight fluctuation has some component of fluid retention in lower extremities -gaining weight recently on single agent gemcitabine   3. Afib, CHF -diagnosed after PAC placement on 02/23/20. -CHADS2 score 2, moderate risk for stroke. Started on metoprolol and eliquis on 03/01/20. Tolerating well without bleeding. -He was started on lasix 20 mg every other day after consult with Dr. Gwenlyn Found; his dyspnea and leg edema have improved -He had cardioversion on 05/19/2020 -continue f/u with cardiology, Dr. Gwenlyn Found   4. DM, HTN, HLD, Gout  -On Metformin, amlodipine, lisinopril., allopurinol  -Continue to f/u with his PCP -now onmetoprololand Eliquisfor recently diagnosed Afib  5. Nicotine Use -He never smoked but has been chewing Tobacco for the past 60 years.He no longer drinks alcoholand tells me he has quit smoking (11/25/19).  6.GERDand gastritis, history ofesophageal candidiasis -Hehad repeated EGDwith Dr. Havery Moros in 07/2019. 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 Gangemi appears stable.  He has recovered well from last cycle of gemcitabine.  He has no specific complaints today.   We reviewed next line FOLFOX plus durvalumab including potential side effects, symptom management, call/return precautions, 5FU pump logistics, scheduling, and the palliative goal of therapy. He understands to avoid steroids and protect his skin in the sun. He agrees to proceed.   Labs reviewed. Return for FOLFOX and durvalumab 5/17,  pump d/c 5/19, then f/up with cycle 2 in 2 weeks.   All questions were answered. The patient knows to call the clinic with any problems, questions or concerns. No barriers to learning were detected. Total encounter time was 30 minutes.      Alla Feeling, NP 04/04/21

## 2021-04-04 ENCOUNTER — Inpatient Hospital Stay: Payer: Medicare HMO

## 2021-04-04 ENCOUNTER — Encounter: Payer: Self-pay | Admitting: Nurse Practitioner

## 2021-04-04 ENCOUNTER — Inpatient Hospital Stay: Payer: Medicare HMO | Admitting: Nurse Practitioner

## 2021-04-04 ENCOUNTER — Other Ambulatory Visit: Payer: Self-pay

## 2021-04-04 VITALS — BP 139/71 | HR 73 | Temp 97.0°F | Resp 20 | Ht 69.0 in | Wt 227.1 lb

## 2021-04-04 DIAGNOSIS — Z5112 Encounter for antineoplastic immunotherapy: Secondary | ICD-10-CM | POA: Diagnosis not present

## 2021-04-04 DIAGNOSIS — I10 Essential (primary) hypertension: Secondary | ICD-10-CM

## 2021-04-04 DIAGNOSIS — J90 Pleural effusion, not elsewhere classified: Secondary | ICD-10-CM | POA: Diagnosis not present

## 2021-04-04 DIAGNOSIS — Z95828 Presence of other vascular implants and grafts: Secondary | ICD-10-CM

## 2021-04-04 DIAGNOSIS — C221 Intrahepatic bile duct carcinoma: Secondary | ICD-10-CM

## 2021-04-04 DIAGNOSIS — Z79899 Other long term (current) drug therapy: Secondary | ICD-10-CM | POA: Diagnosis not present

## 2021-04-04 DIAGNOSIS — Z5111 Encounter for antineoplastic chemotherapy: Secondary | ICD-10-CM | POA: Diagnosis not present

## 2021-04-04 DIAGNOSIS — Z452 Encounter for adjustment and management of vascular access device: Secondary | ICD-10-CM | POA: Diagnosis not present

## 2021-04-04 LAB — CMP (CANCER CENTER ONLY)
ALT: 18 U/L (ref 0–44)
AST: 27 U/L (ref 15–41)
Albumin: 3.4 g/dL — ABNORMAL LOW (ref 3.5–5.0)
Alkaline Phosphatase: 49 U/L (ref 38–126)
Anion gap: 8 (ref 5–15)
BUN: 15 mg/dL (ref 8–23)
CO2: 25 mmol/L (ref 22–32)
Calcium: 9.1 mg/dL (ref 8.9–10.3)
Chloride: 106 mmol/L (ref 98–111)
Creatinine: 1.42 mg/dL — ABNORMAL HIGH (ref 0.61–1.24)
GFR, Estimated: 52 mL/min — ABNORMAL LOW (ref >60)
Glucose, Bld: 114 mg/dL — ABNORMAL HIGH (ref 70–99)
Potassium: 4.2 mmol/L (ref 3.5–5.1)
Sodium: 139 mmol/L (ref 135–145)
Total Bilirubin: 0.6 mg/dL (ref 0.3–1.2)
Total Protein: 6.8 g/dL (ref 6.5–8.1)

## 2021-04-04 LAB — CBC WITH DIFFERENTIAL (CANCER CENTER ONLY)
Abs Immature Granulocytes: 0.04 10*3/uL (ref 0.00–0.07)
Basophils Absolute: 0.1 10*3/uL (ref 0.0–0.1)
Basophils Relative: 1 %
Eosinophils Absolute: 0.1 10*3/uL (ref 0.0–0.5)
Eosinophils Relative: 2 %
HCT: 33.6 % — ABNORMAL LOW (ref 39.0–52.0)
Hemoglobin: 10.7 g/dL — ABNORMAL LOW (ref 13.0–17.0)
Immature Granulocytes: 1 %
Lymphocytes Relative: 12 %
Lymphs Abs: 0.7 10*3/uL (ref 0.7–4.0)
MCH: 34 pg (ref 26.0–34.0)
MCHC: 31.8 g/dL (ref 30.0–36.0)
MCV: 106.7 fL — ABNORMAL HIGH (ref 80.0–100.0)
Monocytes Absolute: 1.1 10*3/uL — ABNORMAL HIGH (ref 0.1–1.0)
Monocytes Relative: 18 %
Neutro Abs: 3.9 10*3/uL (ref 1.7–7.7)
Neutrophils Relative %: 66 %
Platelet Count: 171 10*3/uL (ref 150–400)
RBC: 3.15 MIL/uL — ABNORMAL LOW (ref 4.22–5.81)
RDW: 17.2 % — ABNORMAL HIGH (ref 11.5–15.5)
WBC Count: 5.8 10*3/uL (ref 4.0–10.5)
nRBC: 0 % (ref 0.0–0.2)

## 2021-04-04 LAB — TSH: TSH: 3.502 u[IU]/mL (ref 0.320–4.118)

## 2021-04-04 MED ORDER — MAGNESIUM OXIDE 400 MG PO TABS: 1.0000 | ORAL_TABLET | Freq: Every day | ORAL | 0 refills | Status: DC

## 2021-04-04 MED ORDER — HEPARIN SOD (PORK) LOCK FLUSH 100 UNIT/ML IV SOLN
500.0000 [IU] | Freq: Once | INTRAVENOUS | Status: AC
Start: 2021-04-04 — End: 2021-04-04
  Administered 2021-04-04: 500 [IU]
  Filled 2021-04-04: qty 5

## 2021-04-04 MED ORDER — LIDOCAINE-PRILOCAINE 2.5-2.5 % EX CREA: 1.0000 "application " | TOPICAL_CREAM | CUTANEOUS | 3 refills | Status: DC | PRN

## 2021-04-04 MED ORDER — SODIUM CHLORIDE 0.9% FLUSH
10.0000 mL | Freq: Once | INTRAVENOUS | Status: AC
  Administered 2021-04-04: 10 mL
  Filled 2021-04-04: qty 10

## 2021-04-04 NOTE — Patient Instructions (Signed)

## 2021-04-05 ENCOUNTER — Inpatient Hospital Stay: Payer: Medicare HMO

## 2021-04-05 ENCOUNTER — Ambulatory Visit: Payer: Medicare HMO | Admitting: Cardiovascular Disease

## 2021-04-05 VITALS — BP 141/72 | HR 70 | Temp 97.0°F | Resp 20 | Wt 226.0 lb

## 2021-04-05 DIAGNOSIS — J90 Pleural effusion, not elsewhere classified: Secondary | ICD-10-CM | POA: Diagnosis not present

## 2021-04-05 DIAGNOSIS — Z5112 Encounter for antineoplastic immunotherapy: Secondary | ICD-10-CM | POA: Diagnosis not present

## 2021-04-05 DIAGNOSIS — Z7189 Other specified counseling: Secondary | ICD-10-CM

## 2021-04-05 DIAGNOSIS — Z452 Encounter for adjustment and management of vascular access device: Secondary | ICD-10-CM | POA: Diagnosis not present

## 2021-04-05 DIAGNOSIS — Z79899 Other long term (current) drug therapy: Secondary | ICD-10-CM | POA: Diagnosis not present

## 2021-04-05 DIAGNOSIS — Z5111 Encounter for antineoplastic chemotherapy: Secondary | ICD-10-CM | POA: Diagnosis not present

## 2021-04-05 DIAGNOSIS — C221 Intrahepatic bile duct carcinoma: Secondary | ICD-10-CM | POA: Diagnosis not present

## 2021-04-05 LAB — T4: T4, Total: 11.8 ug/dL (ref 4.5–12.0)

## 2021-04-05 MED ORDER — HEPARIN SOD (PORK) LOCK FLUSH 100 UNIT/ML IV SOLN
500.0000 [IU] | Freq: Once | INTRAVENOUS | Status: DC | PRN
  Filled 2021-04-05: qty 5

## 2021-04-05 MED ORDER — SODIUM CHLORIDE 0.9 % IV SOLN
1500.0000 mg | Freq: Once | INTRAVENOUS | Status: AC
  Administered 2021-04-05: 1500 mg via INTRAVENOUS
  Filled 2021-04-05: qty 30

## 2021-04-05 MED ORDER — OXALIPLATIN CHEMO INJECTION 100 MG/20ML
75.0000 mg/m2 | Freq: Once | INTRAVENOUS | Status: AC
  Administered 2021-04-05: 165 mg via INTRAVENOUS
  Filled 2021-04-05: qty 33

## 2021-04-05 MED ORDER — SODIUM CHLORIDE 0.9 % IV SOLN
5000.0000 mg | INTRAVENOUS | Status: DC
  Administered 2021-04-05: 5000 mg via INTRAVENOUS
  Filled 2021-04-05: qty 100

## 2021-04-05 MED ORDER — PALONOSETRON HCL INJECTION 0.25 MG/5ML
INTRAVENOUS | Status: AC
  Filled 2021-04-05: qty 5

## 2021-04-05 MED ORDER — DEXTROSE 5 % IV SOLN
Freq: Once | INTRAVENOUS | Status: AC
  Filled 2021-04-05: qty 250

## 2021-04-05 MED ORDER — DEXTROSE 5 % IV SOLN
400.0000 mg/m2 | Freq: Once | INTRAVENOUS | Status: AC
  Administered 2021-04-05: 880 mg via INTRAVENOUS
  Filled 2021-04-05: qty 44

## 2021-04-05 MED ORDER — SODIUM CHLORIDE 0.9 % IV SOLN
Freq: Once | INTRAVENOUS | Status: AC
  Filled 2021-04-05: qty 250

## 2021-04-05 MED ORDER — PALONOSETRON HCL INJECTION 0.25 MG/5ML
0.2500 mg | Freq: Once | INTRAVENOUS | Status: AC
  Administered 2021-04-05: 0.25 mg via INTRAVENOUS

## 2021-04-05 MED ORDER — SODIUM CHLORIDE 0.9% FLUSH
10.0000 mL | INTRAVENOUS | Status: DC | PRN
  Filled 2021-04-05: qty 10

## 2021-04-05 NOTE — Patient Instructions (Addendum)
[facility]  Discharge Instructions: Thank you for choosing [facility] to provide your oncology and hematology care.   If you have a lab appointment with the [facility], please go directly to the [facility] and check in at the registration area.   Wear comfortable clothing and clothing appropriate for easy access to any Portacath or PICC line.   We strive to give you quality time with your provider. You may need to reschedule your appointment if you arrive late (15 or more minutes).  Arriving late affects you and other patients whose appointments are after yours.  Also, if you miss three or more appointments without notifying the office, you may be dismissed from the clinic at the provider's discretion.      For prescription refill requests, have your pharmacy contact our office and allow 72 hours for refills to be completed.    Today you received the following chemotherapy and/or immunotherapy agents Imvinzi/Oxaliplatin/Leucovorin/Fluorouracil.     To help prevent nausea and vomiting after your treatment, we encourage you to take your nausea medication as directed.  BELOW ARE SYMPTOMS THAT SHOULD BE REPORTED IMMEDIATELY: . *FEVER GREATER THAN 100.4 F (38 C) OR HIGHER . *CHILLS OR SWEATING . *NAUSEA AND VOMITING THAT IS NOT CONTROLLED WITH YOUR NAUSEA MEDICATION . *UNUSUAL SHORTNESS OF BREATH . *UNUSUAL BRUISING OR BLEEDING . *URINARY PROBLEMS (pain or burning when urinating, or frequent urination) . *BOWEL PROBLEMS (unusual diarrhea, constipation, pain near the anus) . TENDERNESS IN MOUTH AND THROAT WITH OR WITHOUT PRESENCE OF ULCERS (sore throat, sores in mouth, or a toothache) . UNUSUAL RASH, SWELLING OR PAIN  . UNUSUAL VAGINAL DISCHARGE OR ITCHING   Items with * indicate a potential emergency and should be followed up as soon as possible or go to the Emergency Department if any problems should occur.  Please show the CHEMOTHERAPY  ALERT CARD or IMMUNOTHERAPY ALERT CARD at check-in to the Emergency Department and triage nurse.  Should you have questions after your visit or need to cancel or reschedule your appointment, please contact [facility]  Dept: [phone]  and follow the prompts.  Office hours are 8:00 a.m. to 4:30 p.m. Monday - Friday. Please note that voicemails left after 4:00 p.m. may not be returned until the following business day.  We are closed weekends and major holidays. You have access to a nurse at all times for urgent questions. Please call the main number to the clinic Dept: [phone] and follow the prompts.   For any non-urgent questions, you may also contact your provider using MyChart. We now offer e-Visits for anyone 9 and older to request care online for non-urgent symptoms. For details visit [email or web address].   Also download the MyChart app! Go to the app store, search "MyChart", open the app, select [facility], and log in with your MyChart username and password.  Due to Covid, a mask is required upon entering the hospital/clinic. If you do not have a mask, one will be given to you upon arrival. For doctor visits, patients may have 1 support person aged 81 or older with them. For treatment visits, patients cannot have anyone with them due to current Covid guidelines and our immunocompromised population.   Durvalumab injection What is this medicine? DURVALUMAB (dur VAL ue mab) is a monoclonal antibody. It is used to treat lung cancer. This medicine may be used for other purposes; ask your health care provider or pharmacist if you have  questions. COMMON BRAND NAME(S): IMFINZI What should I tell my health care provider before I take this medicine? They need to know if you have any of these conditions:  autoimmune diseases like Crohn's disease, ulcerative colitis, or lupus  have had or planning to have an allogeneic stem cell transplant (uses someone  else's stem cells)  history of organ transplant  history of radiation to the chest  nervous system problems like myasthenia gravis or Guillain-Barre syndrome  an unusual or allergic reaction to durvalumab, other medicines, foods, dyes, or preservatives  pregnant or trying to get pregnant  breast-feeding How should I use this medicine? This medicine is for infusion into a vein. It is given by a health care professional in a hospital or clinic setting. A special MedGuide will be given to you before each treatment. Be sure to read this information carefully each time. Talk to your pediatrician regarding the use of this medicine in children. Special care may be needed. Overdosage: If you think you have taken too much of this medicine contact a poison control center or emergency room at once. NOTE: This medicine is only for you. Do not share this medicine with others. What if I miss a dose? It is important not to miss your dose. Call your doctor or health care professional if you are unable to keep an appointment. What may interact with this medicine? Interactions have not been studied. This list may not describe all possible interactions. Give your health care provider a list of all the medicines, herbs, non-prescription drugs, or dietary supplements you use. Also tell them if you smoke, drink alcohol, or use illegal drugs. Some items may interact with your medicine. What should I watch for while using this medicine? This drug may make you feel generally unwell. Continue your course of treatment even though you feel ill unless your doctor tells you to stop. You may need blood work done while you are taking this medicine. Do not become pregnant while taking this medicine or for 3 months after stopping it. Women should inform their doctor if they wish to become pregnant or think they might be pregnant. There is a potential for serious side effects to an unborn child. Talk to your health care  professional or pharmacist for more information. Do not breast-feed an infant while taking this medicine or for 3 months after stopping it. What side effects may I notice from receiving this medicine? Side effects that you should report to your doctor or health care professional as soon as possible:  allergic reactions like skin rash, itching or hives, swelling of the face, lips, or tongue  black, tarry stools  bloody or watery diarrhea  breathing problems  change in emotions or moods  change in sex drive  changes in vision  chest pain or chest tightness  chills  confusion  cough  facial flushing  fever  headache  signs and symptoms of high blood sugar such as dizziness; dry mouth; dry skin; fruity breath; nausea; stomach pain; increased hunger or thirst; increased urination  signs and symptoms of liver injury like dark yellow or brown urine; general ill feeling or flu-like symptoms; light-colored stools; loss of appetite; nausea; right upper belly pain; unusually weak or tired; yellowing of the eyes or skin  stomach pain  trouble passing urine or change in the amount of urine  weight gain or weight loss Side effects that usually do not require medical attention (report these to your doctor or health care professional if  they continue or are bothersome):  bone pain  constipation  loss of appetite  muscle pain  nausea  swelling of the ankles, feet, hands  tiredness This list may not describe all possible side effects. Call your doctor for medical advice about side effects. You may report side effects to FDA at 1-800-FDA-1088. Where should I keep my medicine? This drug is given in a hospital or clinic and will not be stored at home. NOTE: This sheet is a summary. It may not cover all possible information. If you have questions about this medicine, talk to your doctor, pharmacist, or health care provider.  2021 Elsevier/Gold Standard (2020-01-15  13:01:29)  Oxaliplatin Injection What is this medicine? OXALIPLATIN (ox AL i PLA tin) is a chemotherapy drug. It targets fast dividing cells, like cancer cells, and causes these cells to die. This medicine is used to treat cancers of the colon and rectum, and many other cancers. This medicine may be used for other purposes; ask your health care provider or pharmacist if you have questions. COMMON BRAND NAME(S): Eloxatin What should I tell my health care provider before I take this medicine? They need to know if you have any of these conditions:  heart disease  history of irregular heartbeat  liver disease  low blood counts, like white cells, platelets, or red blood cells  lung or breathing disease, like asthma  take medicines that treat or prevent blood clots  tingling of the fingers or toes, or other nerve disorder  an unusual or allergic reaction to oxaliplatin, other chemotherapy, other medicines, foods, dyes, or preservatives  pregnant or trying to get pregnant  breast-feeding How should I use this medicine? This drug is given as an infusion into a vein. It is administered in a hospital or clinic by a specially trained health care professional. Talk to your pediatrician regarding the use of this medicine in children. Special care may be needed. Overdosage: If you think you have taken too much of this medicine contact a poison control center or emergency room at once. NOTE: This medicine is only for you. Do not share this medicine with others. What if I miss a dose? It is important not to miss a dose. Call your doctor or health care professional if you are unable to keep an appointment. What may interact with this medicine? Do not take this medicine with any of the following medications:  cisapride  dronedarone  pimozide  thioridazine This medicine may also interact with the following medications:  aspirin and aspirin-like medicines  certain medicines that treat  or prevent blood clots like warfarin, apixaban, dabigatran, and rivaroxaban  cisplatin  cyclosporine  diuretics  medicines for infection like acyclovir, adefovir, amphotericin B, bacitracin, cidofovir, foscarnet, ganciclovir, gentamicin, pentamidine, vancomycin  NSAIDs, medicines for pain and inflammation, like ibuprofen or naproxen  other medicines that prolong the QT interval (an abnormal heart rhythm)  pamidronate  zoledronic acid This list may not describe all possible interactions. Give your health care provider a list of all the medicines, herbs, non-prescription drugs, or dietary supplements you use. Also tell them if you smoke, drink alcohol, or use illegal drugs. Some items may interact with your medicine. What should I watch for while using this medicine? Your condition will be monitored carefully while you are receiving this medicine. You may need blood work done while you are taking this medicine. This medicine may make you feel generally unwell. This is not uncommon as chemotherapy can affect healthy cells as well as  cancer cells. Report any side effects. Continue your course of treatment even though you feel ill unless your healthcare professional tells you to stop. This medicine can make you more sensitive to cold. Do not drink cold drinks or use ice. Cover exposed skin before coming in contact with cold temperatures or cold objects. When out in cold weather wear warm clothing and cover your mouth and nose to warm the air that goes into your lungs. Tell your doctor if you get sensitive to the cold. Do not become pregnant while taking this medicine or for 9 months after stopping it. Women should inform their health care professional if they wish to become pregnant or think they might be pregnant. Men should not father a child while taking this medicine and for 6 months after stopping it. There is potential for serious side effects to an unborn child. Talk to your health care  professional for more information. Do not breast-feed a child while taking this medicine or for 3 months after stopping it. This medicine has caused ovarian failure in some women. This medicine may make it more difficult to get pregnant. Talk to your health care professional if you are concerned about your fertility. This medicine has caused decreased sperm counts in some men. This may make it more difficult to father a child. Talk to your health care professional if you are concerned about your fertility. This medicine may increase your risk of getting an infection. Call your health care professional for advice if you get a fever, chills, or sore throat, or other symptoms of a cold or flu. Do not treat yourself. Try to avoid being around people who are sick. Avoid taking medicines that contain aspirin, acetaminophen, ibuprofen, naproxen, or ketoprofen unless instructed by your health care professional. These medicines may hide a fever. Be careful brushing or flossing your teeth or using a toothpick because you may get an infection or bleed more easily. If you have any dental work done, tell your dentist you are receiving this medicine. What side effects may I notice from receiving this medicine? Side effects that you should report to your doctor or health care professional as soon as possible:  allergic reactions like skin rash, itching or hives, swelling of the face, lips, or tongue  breathing problems  cough  low blood counts - this medicine may decrease the number of white blood cells, red blood cells, and platelets. You may be at increased risk for infections and bleeding  nausea, vomiting  pain, redness, or irritation at site where injected  pain, tingling, numbness in the hands or feet  signs and symptoms of bleeding such as bloody or black, tarry stools; red or dark brown urine; spitting up blood or brown material that looks like coffee grounds; red spots on the skin; unusual bruising  or bleeding from the eyes, gums, or nose  signs and symptoms of a dangerous change in heartbeat or heart rhythm like chest pain; dizziness; fast, irregular heartbeat; palpitations; feeling faint or lightheaded; falls  signs and symptoms of infection like fever; chills; cough; sore throat; pain or trouble passing urine  signs and symptoms of liver injury like dark yellow or brown urine; general ill feeling or flu-like symptoms; light-colored stools; loss of appetite; nausea; right upper belly pain; unusually weak or tired; yellowing of the eyes or skin  signs and symptoms of low red blood cells or anemia such as unusually weak or tired; feeling faint or lightheaded; falls  signs and symptoms of  muscle injury like dark urine; trouble passing urine or change in the amount of urine; unusually weak or tired; muscle pain; back pain Side effects that usually do not require medical attention (report to your doctor or health care professional if they continue or are bothersome):  changes in taste  diarrhea  gas  hair loss  loss of appetite  mouth sores This list may not describe all possible side effects. Call your doctor for medical advice about side effects. You may report side effects to FDA at 1-800-FDA-1088. Where should I keep my medicine? This drug is given in a hospital or clinic and will not be stored at home. NOTE: This sheet is a summary. It may not cover all possible information. If you have questions about this medicine, talk to your doctor, pharmacist, or health care provider.  2021 Elsevier/Gold Standard (2019-03-26 12:20:35)  Leucovorin injection What is this medicine? LEUCOVORIN (loo koe VOR in) is used to prevent or treat the harmful effects of some medicines. This medicine is used to treat anemia caused by a low amount of folic acid in the body. It is also used with 5-fluorouracil (5-FU) to treat colon cancer. This medicine may be used for other purposes; ask your health  care provider or pharmacist if you have questions. What should I tell my health care provider before I take this medicine? They need to know if you have any of these conditions:  anemia from low levels of vitamin B-12 in the blood  an unusual or allergic reaction to leucovorin, folic acid, other medicines, foods, dyes, or preservatives  pregnant or trying to get pregnant  breast-feeding How should I use this medicine? This medicine is for injection into a muscle or into a vein. It is given by a health care professional in a hospital or clinic setting. Talk to your pediatrician regarding the use of this medicine in children. Special care may be needed. Overdosage: If you think you have taken too much of this medicine contact a poison control center or emergency room at once. NOTE: This medicine is only for you. Do not share this medicine with others. What if I miss a dose? This does not apply. What may interact with this medicine?  capecitabine  fluorouracil  phenobarbital  phenytoin  primidone  trimethoprim-sulfamethoxazole This list may not describe all possible interactions. Give your health care provider a list of all the medicines, herbs, non-prescription drugs, or dietary supplements you use. Also tell them if you smoke, drink alcohol, or use illegal drugs. Some items may interact with your medicine. What should I watch for while using this medicine? Your condition will be monitored carefully while you are receiving this medicine. This medicine may increase the side effects of 5-fluorouracil, 5-FU. Tell your doctor or health care professional if you have diarrhea or mouth sores that do not get better or that get worse. What side effects may I notice from receiving this medicine? Side effects that you should report to your doctor or health care professional as soon as possible:  allergic reactions like skin rash, itching or hives, swelling of the face, lips, or  tongue  breathing problems  fever, infection  mouth sores  unusual bleeding or bruising  unusually weak or tired Side effects that usually do not require medical attention (report to your doctor or health care professional if they continue or are bothersome):  constipation or diarrhea  loss of appetite  nausea, vomiting This list may not describe all possible side effects.  Call your doctor for medical advice about side effects. You may report side effects to FDA at 1-800-FDA-1088. Where should I keep my medicine? This drug is given in a hospital or clinic and will not be stored at home. NOTE: This sheet is a summary. It may not cover all possible information. If you have questions about this medicine, talk to your doctor, pharmacist, or health care provider.  2021 Elsevier/Gold Standard (2008-05-12 16:50:29)  Fluorouracil, 5-FU injection What is this medicine? FLUOROURACIL, 5-FU (flure oh YOOR a sil) is a chemotherapy drug. It slows the growth of cancer cells. This medicine is used to treat many types of cancer like breast cancer, colon or rectal cancer, pancreatic cancer, and stomach cancer. This medicine may be used for other purposes; ask your health care provider or pharmacist if you have questions. COMMON BRAND NAME(S): Adrucil What should I tell my health care provider before I take this medicine? They need to know if you have any of these conditions:  blood disorders  dihydropyrimidine dehydrogenase (DPD) deficiency  infection (especially a virus infection such as chickenpox, cold sores, or herpes)  kidney disease  liver disease  malnourished, poor nutrition  recent or ongoing radiation therapy  an unusual or allergic reaction to fluorouracil, other chemotherapy, other medicines, foods, dyes, or preservatives  pregnant or trying to get pregnant  breast-feeding How should I use this medicine? This drug is given as an infusion or injection into a vein. It is  administered in a hospital or clinic by a specially trained health care professional. Talk to your pediatrician regarding the use of this medicine in children. Special care may be needed. Overdosage: If you think you have taken too much of this medicine contact a poison control center or emergency room at once. NOTE: This medicine is only for you. Do not share this medicine with others. What if I miss a dose? It is important not to miss your dose. Call your doctor or health care professional if you are unable to keep an appointment. What may interact with this medicine? Do not take this medicine with any of the following medications:  live virus vaccines This medicine may also interact with the following medications:  medicines that treat or prevent blood clots like warfarin, enoxaparin, and dalteparin This list may not describe all possible interactions. Give your health care provider a list of all the medicines, herbs, non-prescription drugs, or dietary supplements you use. Also tell them if you smoke, drink alcohol, or use illegal drugs. Some items may interact with your medicine. What should I watch for while using this medicine? Visit your doctor for checks on your progress. This drug may make you feel generally unwell. This is not uncommon, as chemotherapy can affect healthy cells as well as cancer cells. Report any side effects. Continue your course of treatment even though you feel ill unless your doctor tells you to stop. In some cases, you may be given additional medicines to help with side effects. Follow all directions for their use. Call your doctor or health care professional for advice if you get a fever, chills or sore throat, or other symptoms of a cold or flu. Do not treat yourself. This drug decreases your body's ability to fight infections. Try to avoid being around people who are sick. This medicine may increase your risk to bruise or bleed. Call your doctor or health care  professional if you notice any unusual bleeding. Be careful brushing and flossing your teeth or using a  toothpick because you may get an infection or bleed more easily. If you have any dental work done, tell your dentist you are receiving this medicine. Avoid taking products that contain aspirin, acetaminophen, ibuprofen, naproxen, or ketoprofen unless instructed by your doctor. These medicines may hide a fever. Do not become pregnant while taking this medicine. Women should inform their doctor if they wish to become pregnant or think they might be pregnant. There is a potential for serious side effects to an unborn child. Talk to your health care professional or pharmacist for more information. Do not breast-feed an infant while taking this medicine. Men should inform their doctor if they wish to father a child. This medicine may lower sperm counts. Do not treat diarrhea with over the counter products. Contact your doctor if you have diarrhea that lasts more than 2 days or if it is severe and watery. This medicine can make you more sensitive to the sun. Keep out of the sun. If you cannot avoid being in the sun, wear protective clothing and use sunscreen. Do not use sun lamps or tanning beds/booths. What side effects may I notice from receiving this medicine? Side effects that you should report to your doctor or health care professional as soon as possible:  allergic reactions like skin rash, itching or hives, swelling of the face, lips, or tongue  low blood counts - this medicine may decrease the number of white blood cells, red blood cells and platelets. You may be at increased risk for infections and bleeding.  signs of infection - fever or chills, cough, sore throat, pain or difficulty passing urine  signs of decreased platelets or bleeding - bruising, pinpoint red spots on the skin, black, tarry stools, blood in the urine  signs of decreased red blood cells - unusually weak or tired, fainting  spells, lightheadedness  breathing problems  changes in vision  chest pain  mouth sores  nausea and vomiting  pain, swelling, redness at site where injected  pain, tingling, numbness in the hands or feet  redness, swelling, or sores on hands or feet  stomach pain  unusual bleeding Side effects that usually do not require medical attention (report to your doctor or health care professional if they continue or are bothersome):  changes in finger or toe nails  diarrhea  dry or itchy skin  hair loss  headache  loss of appetite  sensitivity of eyes to the light  stomach upset  unusually teary eyes This list may not describe all possible side effects. Call your doctor for medical advice about side effects. You may report side effects to FDA at 1-800-FDA-1088. Where should I keep my medicine? This drug is given in a hospital or clinic and will not be stored at home. NOTE: This sheet is a summary. It may not cover all possible information. If you have questions about this medicine, talk to your doctor, pharmacist, or health care provider.  2021 Elsevier/Gold Standard (2019-10-07 15:00:03)

## 2021-04-07 ENCOUNTER — Other Ambulatory Visit: Payer: Self-pay

## 2021-04-07 ENCOUNTER — Inpatient Hospital Stay: Payer: Medicare HMO

## 2021-04-07 VITALS — BP 130/69 | HR 76 | Temp 98.9°F | Resp 16

## 2021-04-07 DIAGNOSIS — Z5112 Encounter for antineoplastic immunotherapy: Secondary | ICD-10-CM | POA: Diagnosis not present

## 2021-04-07 DIAGNOSIS — C221 Intrahepatic bile duct carcinoma: Secondary | ICD-10-CM

## 2021-04-07 DIAGNOSIS — J90 Pleural effusion, not elsewhere classified: Secondary | ICD-10-CM | POA: Diagnosis not present

## 2021-04-07 DIAGNOSIS — Z5111 Encounter for antineoplastic chemotherapy: Secondary | ICD-10-CM | POA: Diagnosis not present

## 2021-04-07 DIAGNOSIS — Z79899 Other long term (current) drug therapy: Secondary | ICD-10-CM | POA: Diagnosis not present

## 2021-04-07 DIAGNOSIS — Z452 Encounter for adjustment and management of vascular access device: Secondary | ICD-10-CM | POA: Diagnosis not present

## 2021-04-07 DIAGNOSIS — Z7189 Other specified counseling: Secondary | ICD-10-CM

## 2021-04-07 MED ORDER — HEPARIN SOD (PORK) LOCK FLUSH 100 UNIT/ML IV SOLN
500.0000 [IU] | Freq: Once | INTRAVENOUS | Status: AC | PRN
  Administered 2021-04-07: 500 [IU]
  Filled 2021-04-07: qty 5

## 2021-04-07 MED ORDER — SODIUM CHLORIDE 0.9% FLUSH
10.0000 mL | INTRAVENOUS | Status: DC | PRN
  Administered 2021-04-07: 10 mL
  Filled 2021-04-07: qty 10

## 2021-04-07 NOTE — Patient Instructions (Signed)

## 2021-04-08 ENCOUNTER — Ambulatory Visit: Payer: Medicare HMO | Admitting: Hematology

## 2021-04-08 ENCOUNTER — Other Ambulatory Visit: Payer: Medicare HMO

## 2021-04-15 NOTE — Progress Notes (Signed)
[facility]   [phone] [phone]   Clinic Follow up Note   Patient Care Team: [name], PA as PCP - General (Family Medicine) Lorretta Harp, MD as PCP - Cardiology (Cardiology) [name], MD as Consulting Physician (General Surgery) [name], [name], MD as Consulting Physician (Gastroenterology) [name], MD as Consulting Physician (Hematology) Lorretta Harp, MD as Consulting Physician (Cardiology)  Date of Service:  04/20/2021  CHIEF COMPLAINT: F/u cholangiocarcinoma  SUMMARY OF ONCOLOGIC HISTORY: Oncology History Overview Note  Cancer Staging Intrahepatic cholangiocarcinoma ([place]) Staging form: Intrahepatic Bile Duct, AJCC 8th Edition - Clinical stage from 08/19/2019: Stage II (cT2, cN0, cM0) - Signed by [name], MD on 08/19/2019    Intrahepatic cholangiocarcinoma ([place])  06/28/2019 Imaging   CT AP W Contrast 06/28/19  IMPRESSION: 1. Heterogeneous hypodensity posteriorly in the right hepatic lobe and potentially extending into the caudate lobe suspicious for a mass. There is felt to be truncation of branches of the portal vein in this vicinity and some narrowing of the hepatic vein, as well as triangular-shaped regions of abnormal hypoenhancement posteriorly in the right hepatic lobe likely representing downstream vascular effects. Cannot exclude malignancy such as cholangiocarcinoma or hepatocellular carcinoma, and follow up hepatic protocol MRI with and without contrast is recommended to further characterize. 2. 4 mm right middle lobe pulmonary nodule is likely benign but may merit surveillance. 3. Cholelithiasis. 4.  Aortic Atherosclerosis (ICD10-I70.0). 5. Prostatomegaly. 6. Mild impingement at L3-4 and L4-5.   07/31/2019 Imaging   MRI Liver 07/31/19 IMPRESSION: 1. 7.3 cm in long axis mass in the right hepatic lobe spanning into the caudate lobe, high suspicion for malignancy such as hepatocellular carcinoma  or cholangiocarcinoma. Suspected effacement or occlusion of the right hepatic vein and posterior branches of the right portal vein. Two smaller tumor nodules along the posterior periphery of the dominant mass. Tissue diagnosis is recommended. 2. No findings of pathologic adenopathy or distant metastatic spread. 3. 9 mm gallstone in the gallbladder. There is mild gallbladder wall thickening which may be from nondistention, correlate clinically in assessing for cholecystitis. 4.  Aortic Atherosclerosis (ICD10-I70.0). 5. Mild diffuse hepatic steatosis.   08/11/2019 Initial Biopsy   DIAGNOSIS: 08/11/19  A. LIVER, RIGHT, BIOPSY:  - Adenocarcinoma.   08/18/2019 Imaging   CT Chest 08/18/19  IMPRESSION: 1. Multiple pulmonary nodules largest at approximately 7 mm in the right lower lobe, nonspecific but concerning given findings in the liver. 2. No signs of definitive metastatic disease, also with mildly enlarged upper abdominal lymph nodes as discussed.   Aortic Atherosclerosis (ICD10-I70.0).   08/19/2019 Initial Diagnosis   Intrahepatic cholangiocarcinoma ([place])   08/19/2019 Cancer Staging   Staging form: Intrahepatic Bile Duct, AJCC 8th Edition - Clinical stage from 08/19/2019: Stage II (cT2, cN0, cM0) - Signed by [name], MD on 08/19/2019   09/29/2019 - 07/19/2020 Chemotherapy   Cisplatin and Gemcitabine 2 weeks on/1 week off starting 09/29/19. Cisplatin held from cycle 9 (03/23/20) due to fluid status/Afib. He is now on maintenance Gemcitabine. Held after 07/19/20 to proceed with liver target therapy.     10/09/2019 Imaging   CT AP IMPRESSION: 1. The dominant right hepatic lobe mass is minimally reduced in size compared to prior exams, currently measuring 6.2 by 5.3 cm, previously 6.2 by 5.6 cm. However, there is a new small hypodense lesion centrally in the right hepatic lobe which is suspicious for a new small focus of tumor. Accordingly this is an overall mixed appearance. 2.  Continued  hypoenhancement in the liver downstream of the tumor likely attributable to narrowing or occlusion of the right hepatic vein by the tumor. By virtue of its location the tumor wraps around the intrahepatic portion of the IVC. 3. 4 mm right middle lobe pulmonary nodule, stable compared to earliest available comparison of 07/18/2019. Surveillance of the patient's pulmonary nodules is recommended. 4. Other imaging findings of potential clinical significance: Coronary atherosclerosis. Cholelithiasis. Prominent stool throughout the colon favors constipation. Moderate prostatomegaly with heterogeneous enhancement of the prostate gland. Lumbar spondylosis and degenerative disc disease causing mild bilateral foraminal impingement at L3-4 and L4-5.   Aortic Atherosclerosis (ICD10-I70.0).   12/24/2019 Imaging   CT CAP W Contrast  IMPRESSION: 1. Right hepatic lobe mass and adjacent right hepatic lobe nodules appear grossly stable. No evidence of distant metastatic disease. 2. Continued stability of small pulmonary nodules. Recommend attention on follow-up. 3. Cholelithiasis. 4. Enlarged prostate. 5. Aortic atherosclerosis (ICD10-I70.0). Coronary artery calcification.   02/23/2020 Procedure   He had PAC placed on 02/23/20.    04/08/2020 Imaging   CT CAP  IMPRESSION: 1. Stable or minimally decreased size of a very ill-defined, hypodense and somewhat retractile appearing mass of the central right lobe of the liver abutting the inferior vena cava and right portal vein, measuring approximately 5.7 x 5.0 cm, previously 6.2 x 5.2 cm when measured similarly. Findings are consistent with stable or minimally improved cholangiocarcinoma. 2. Small hypodense nodules of the right lobe identified on prior examination are poorly appreciated on this single phase contrast examination although not grossly changed. Attention on follow-up. 3. There are new, moderate bilateral pleural effusions and associated atelectasis  or consolidation as well as a new small pericardial effusion, nonspecific although generally concerning and suspicious for malignant effusions. There is no directly visualized pleural nodularity. 4. Multiple small pulmonary nodules are stable.  No new nodules. 5. Coronary artery disease. Aortic Atherosclerosis (ICD10-I70.0).   07/06/2020 Imaging   MRI ABDIMPRESSION: 1. Interval decrease in size of the right hepatic lobe lesion in the medial aspect of the segment 7. Findings would suggest a good response to treatment with some contraction of the tumor. 2. No new hepatic lesions. No abdominal adenopathy or metastatic disease. 3. Stable mild intrahepatic biliary dilatation in the right hepatic lobe distal to the lesion. 4. Somewhat tortuous and almost beaded appearance of the hepatic and portal vein radicles. Findings could be radiation related. 5. Cholelithiasis.  CT chest wo contrast IMPRESSION: 1. Persistent/stable moderate-sized bilateral pleural effusions with overlying atelectasis. 2. Stable small right pulmonary nodules. No new or progressive findings. Recommend continued surveillance. 3. No mediastinal or hilar mass or adenopathy. 4. Stable advanced three-vessel coronary artery calcifications. 5. Cholelithiasis. Aortic Atherosclerosis (ICD10-I70.0)   08/11/2020 Procedure   Y90 on 08/11/20 and 08/26/20 with Dr Laurence Ferrari    11/30/2020 Imaging   MRI Abdomen  IMPRESSION: 1. Today's study demonstrates progression of disease with enlarging central lesion in the right lobe of the liver involving portions of segments 7 and 8, now with evidence of some tumor thrombus extension into the intrahepatic portion of the inferior vena cava. This is associated with increasing intrahepatic biliary ductal dilatation in segment 7 of the liver. 2. In addition, there is some subtle hyperenhancement of the distal common bile duct at and immediately proximally to the level of the ampulla. There is  also some subtle delayed enhancement around this region in the pancreatic head. This is of uncertain etiology and significance, but may be inflammatory and currently is  not associated with proximal common bile duct dilatation. However, close attention on follow-up imaging is recommended. 3. Cholelithiasis without evidence of acute cholecystitis at this time. 4. Aortic atherosclerosis.     12/24/2020 - 03/21/2021 Chemotherapy   Restart Gemcitabine 2 weeks on/1 week off given disease progression beginning 12/24/20. Discontinued after 03/21/21 due to disease progression in liver.     03/16/2021 Imaging   MRI abdomen  IMPRESSION: No significant change in size of irregular hypovascular mass in the medial right hepatic lobe.   New 1.1 cm hypovascular lesion with peripheral rim enhancement in liver dome, suspicious for metastatic disease.   New small right pleural effusion and mild ascites.   Cholelithiasis, without evidence of cholecystitis or biliary dilatation.   04/05/2021 -  Chemotherapy   Second-line FOLFOX q2weeks starting 04/05/21    04/05/2021 -  Chemotherapy   Immunotherapy with Durvalumab (Imfinzi) q3-4weeks starting 04/05/21. (in addition to second line FOLFOX)      CURRENT THERAPY:  Second-line FOLFOX q2weeks starting 04/05/21 Additional Durvalumab (Imfinzi) q3-4weeks starting 04/05/21.   INTERVAL HISTORY:  Brandon Sherman is here for a follow up. He was last seen by me on 03/21/21 and was seen by NP Lacie in interim. He presents to the clinic alone. He notes he tolerated start of FOLFOX well. He denies much cold sensitivity. He notes he did have sore of his lip 2 days ago. He notes he tried blistex to help.    REVIEW OF SYSTEMS:   Constitutional: Denies fevers, chills or abnormal weight loss Eyes: Denies blurriness of vision Ears, nose, mouth, throat, and face: Denies mucositis or sore throat Respiratory: Denies cough, dyspnea or wheezes Cardiovascular: Denies palpitation,  chest discomfort or lower extremity swelling Gastrointestinal:  Denies nausea, heartburn or change in bowel habits Skin: Denies abnormal skin rashes (+) Blister on lip  Lymphatics: Denies new lymphadenopathy or easy bruising Neurological:Denies numbness, tingling or new weaknesses Behavioral/Psych: Mood is stable, no new changes  All other systems were reviewed with the patient and are negative.  MEDICAL HISTORY:  Past Medical History:  Diagnosis Date  . Arthritis   . Diabetes ([place])   . GERD (gastroesophageal reflux disease)   . Hyperlipidemia   . Hypertension   . Intrahepatic cholangiocarcinoma ([place])     SURGICAL HISTORY: Past Surgical History:  Procedure Laterality Date  . APPENDECTOMY  1980  . CARDIOVERSION N/A 04/27/2020   Procedure: CARDIOVERSION;  Surgeon: Dorothy Spark, MD;  Location[facility] ENDOSCOPY;  Service: Cardiovascular;  Laterality: N/A;  . CARDIOVERSION N/A 05/19/2020   Procedure: CARDIOVERSION;  Surgeon: Elouise Munroe, MD;  Location: [facility] ENDOSCOPY;  Service: Cardiovascular;  Laterality: N/A;  . COLONOSCOPY    . IR 3D INDEPENDENT WKST  08/11/2020  . IR 3D INDEPENDENT WKST  08/11/2020  . IR ANGIOGRAM SELECTIVE EACH ADDITIONAL VESSEL  08/11/2020  . IR ANGIOGRAM SELECTIVE EACH ADDITIONAL VESSEL  08/11/2020  . IR ANGIOGRAM SELECTIVE EACH ADDITIONAL VESSEL  08/11/2020  . IR ANGIOGRAM SELECTIVE EACH ADDITIONAL VESSEL  08/11/2020  . IR ANGIOGRAM SELECTIVE EACH ADDITIONAL VESSEL  08/11/2020  . IR ANGIOGRAM SELECTIVE EACH ADDITIONAL VESSEL  08/11/2020  . IR ANGIOGRAM SELECTIVE EACH ADDITIONAL VESSEL  08/26/2020  . IR ANGIOGRAM SELECTIVE EACH ADDITIONAL VESSEL  08/26/2020  . IR ANGIOGRAM VISCERAL SELECTIVE  08/11/2020  . IR ANGIOGRAM VISCERAL SELECTIVE  08/26/2020  . IR EMBO ARTERIAL NOT HEMORR HEMANG INC GUIDE ROADMAPPING  08/11/2020  . IR EMBO TUMOR ORGAN ISCHEMIA INFARCT INC GUIDE ROADMAPPING  08/26/2020  . IR  IMAGING GUIDED PORT INSERTION  02/23/2020  . IR RADIOLOGIST EVAL &  MGMT  07/14/2020  . IR RADIOLOGIST EVAL & MGMT  09/30/2020  . IR RADIOLOGIST EVAL & MGMT  12/01/2020  . IR US GUIDE VASC ACCESS LEFT  08/26/2020  . IR US GUIDE VASC ACCESS RIGHT  08/11/2020    I have reviewed the social history and family history with the patient and they are unchanged from previous note.  ALLERGIES:  has No Known Allergies.  MEDICATIONS:  Current Outpatient Medications  Medication Sig Dispense Refill  . allopurinol (ZYLOPRIM) 100 MG tablet Take 100 mg by mouth daily.    Marland Kitchen amLODipine (NORVASC) 5 MG tablet     . colchicine 0.6 MG tablet Take 1 tablet (0.6 mg total) by mouth daily as needed (gout flare). Take daily for 3 days, then as needed for gout flare 30 tablet 0  . diltiazem (CARDIZEM CD) 240 MG 24 hr capsule Take 1 capsule (240 mg total) by mouth daily. 90 capsule 3  . ELIQUIS 5 MG TABS tablet TAKE 1 TABLET TWICE DAILY 180 tablet 1  . fenofibrate (TRICOR) 145 MG tablet Take 145 mg by mouth daily.    . finasteride (PROSCAR) 5 MG tablet Take 5 mg by mouth daily.    . furosemide (LASIX) 40 MG tablet Take 40 mg twice a day 90 tablet 3  . gabapentin (NEURONTIN) 100 MG capsule Take 100 mg by mouth at bedtime.    . hydrALAZINE (APRESOLINE) 50 MG tablet TAKE 2 TABLETS BY MOUTH IN THE MORNING, 1 TABLET AT LUNCH AND 2 IN THE EVENING 450 tablet 1  . hydrALAZINE (APRESOLINE) 50 MG tablet Take 50 mg by mouth 2 (two) times daily.    Marland Kitchen KLOR-CON M20 20 MEQ tablet TAKE 1 TABLET BY MOUTH TWICE A DAY 60 tablet 1  . KLOR-CON M20 20 MEQ tablet TAKE 1 TABLET BY MOUTH TWICE A DAY 180 tablet 1  . lidocaine-prilocaine (EMLA) cream Apply 1 application topically as needed. 30 g 3  . magnesium oxide (MAG-OX) 400 MG tablet Take 1 tablet (400 mg total) by mouth daily. 30 tablet 0  . metoprolol succinate (TOPROL XL) 25 MG 24 hr tablet Take 1 tablet (25 mg total) by mouth daily. 90 tablet 3  . metoprolol tartrate (LOPRESSOR) 50 MG tablet     . pantoprazole (PROTONIX) 40 MG tablet TAKE 1 TABLET BY  MOUTH TWICE A DAY 180 tablet 2  . pravastatin (PRAVACHOL) 40 MG tablet Take 40 mg by mouth daily.    . sucralfate (CARAFATE) 1 g tablet Take 1 tablet (1 g total) by mouth every 6 (six) hours as needed. Please schedule a yearly follow up for further refills: [phone] 60 tablet 0  . tamsulosin (FLOMAX) 0.4 MG CAPS capsule Take 0.4 mg by mouth daily.     No current facility-administered medications for this visit.   Facility-Administered Medications Ordered in Other Visits  Medication Dose Route Frequency Provider Last Rate Last Admin  . fluorouracil (ADRUCIL) 5,000 mg in sodium chloride 0.9 % 150 mL chemo infusion  5,000 mg Intravenous 1 day or 1 dose [name], MD      . leucovorin 880 mg in dextrose 5 % 250 mL infusion  400 mg/m2 (Treatment Plan Recorded) Intravenous Once [name], MD 147 mL/hr at 04/20/21 1305 880 mg at 04/20/21 1305  . oxaliplatin (ELOXATIN) 165 mg in dextrose 5 % 500 mL chemo infusion  75 mg/m2 (Treatment Plan Recorded) Intravenous Once [name], MD  267 mL/hr at 04/20/21 1307 165 mg at 04/20/21 1307    PHYSICAL EXAMINATION: ECOG PERFORMANCE STATUS: 1 - Symptomatic but completely ambulatory Blood pressure 136/69, heart rate 72, respirate 16, temperature 36 pulse ox 100% on room air Due to COVID19 we will limit examination to appearance. Patient had no complaints.  GENERAL:alert, no distress and comfortable SKIN: skin color normal, no rashes or significant lesions EYES: normal, Conjunctiva are pink and non-injected, sclera clear  NEURO: alert & oriented x 3 with fluent speech   LABORATORY DATA:  I have reviewed the data as listed CBC Latest Ref Rng & Units 04/20/2021 04/04/2021 03/21/2021  WBC 4.0 - 10.5 K/uL 4.2 5.8 4.9  Hemoglobin 13.0 - 17.0 g/dL 10.8(L) 10.7(L) 10.9(L)  Hematocrit 39.0 - 52.0 % 32.4(L) 33.6(L) 33.1(L)  Platelets 150 - 400 K/uL 165 171 206     CMP Latest Ref Rng & Units 04/20/2021 04/04/2021 03/21/2021  Glucose 70 - 99 mg/dL 155(H) 114(H) 118(H)   BUN 8 - 23 mg/dL _0 Creatinine 0.61 - 1.24 mg/dL 1.61(H) 1.42(H) 1.62(H)  Sodium 135 - 145 mmol/L 138 139 138  Potassium 3.5 - 5.1 mmol/L 3.9 4.2 4.0  Chloride 98 - 111 mmol/L 104 106 101  CO2 22 - 32 mmol/L _1 Calcium 8.9 - 10.3 mg/dL 9.5 9.1 9.7  Total Protein 6.5 - 8.1 g/dL 7.4 6.8 7.0  Total Bilirubin 0.3 - 1.2 mg/dL 0.5 0.6 0.6  Alkaline Phos 38 - 126 U/L 57 49 66  AST 15 - 41 U/L 35 27 25  ALT 0 - 44 U/L _2 RADIOGRAPHIC STUDIES: I have personally reviewed the radiological images as listed and agreed with the findings in the report. No results found.   ASSESSMENT & PLAN:  XZAVIEN HARADA is a 75 y.o. male with    1.Intrahepatic cholangiocarcinoma, cT2N0M0,stage II, unresectable,with indeterminate lung nodules -He was diagnosed in 07/2019.CT scans and MRI liver showa large7.3cm mass in the right hepatic lobe whichabutsportal vein.  -He was seen byour local surgeon Dr. Barry Dienes andDr Carlis [facility] concluded that cancer is not resectable due to the invasion to portal vein.Given cancer is non-resectable his cancer is likely not curable but still treatable. -He was treated with standardfirst line chemo with IV Cisplatin and Gemcitabine 2 weeks on/1 weekoff beginning 09/29/19.Cisplatin was held since C9 (03/23/20) due to fluid status/AFib. Hecontinued maintenanceGemcitabineuntil 07/19/20. He proceeded with Y90 on 08/11/20 and 08/26/20.  -His MRI Abdomen from 12/11/20 showed progression of diseasein liver with tumor thrombus extension into IVC. Given disease progression, Dr Geroge Baseman does not recommend more Y90 or liver target therapy at this point. -To control his disease I restarted Single agent Gemcitabine 2 weeks on/1 weekoff beginning 12/24/20. -He has IDH-1 mutation, which makes him eligible for an [place] inhibitordrugIvosidenib. -Based on 03/16/21 MRI he progressed with New 1.1 cm hypovascular lesion suspicious for metastatic  disease. -He is not a candidate for cisplatin due to his CKD and CHF. I changed him to second-line chemo with FOLFOX q2weeks and added immunotherapy Durvalumab (Imfinzi) q3-4weeks starting 04/05/21. Goal of chemo is to control his disease and prolong his life.  -He tolerated first cycle well with mild cold senility and sore above lip. I reviewed management with him.  -Labs reviewed and stable today. Overall adequate to proceed with C2 FOLFIRI today.  -F/u in 2 weeks.    2. DM, HTN, HLD, Gout, LE edema -On Metformin, amlodipine, lisinopril., allopurinol, Lasix.Continue  to f/u with his PCPand cardiologist. -His PCP has started him on Janumet for his feet burning.  3.AF andCHF -With PAC placement on 02/23/20 he wasfound to haveAfib. Based on [place].He is at moderate risk for stroke. -HeunderwentCardioversion with Dr. Meda Coffee on 04/27/20. After [facility] late 6/2021he had another cardioversion on 05/19/20. He has recovered well. -His Afib is intermittent. He will continue his medications and f/u with cardiologist. -He is on Eliquis  4. Nicotine Use -He never smoked but has been chewing Tobacco for the past 60 years.He no longer drinks alcohol. -Hecurrentlychews 1-2 times a day.Iagainencouraged him toreduce and quit completely.  5.GERDand gastritis, history ofesophageal candidiasis, DM -Hehad repeated EGDwith Dr. Havery Moros in 07/2019. 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  -Will monitor andmayrepeat EGDin future -He is not currently on DM medication. I encouraged him to watch his diet and reduce sugar.  PLAN:  -Labs reviewed, adequate to proceed with C2 FOLFOX today  -Lab, flush, f/u and FOLFOX and Imfinzi in 2, 4, 6 weeks.    No problem-specific Assessment & Plan notes found for this encounter.   No orders of the defined types were placed in this  encounter.  All questions were answered. The patient knows to call the clinic with any problems, questions or concerns. No barriers to learning was detected. The total time spent in the appointment was 30 minutes.     [name], MD 04/20/2021   I, Joslyn Devon, am acting as scribe for [name], MD.   I have reviewed the above documentation for accuracy and completeness, and I agree with the above.

## 2021-04-20 ENCOUNTER — Inpatient Hospital Stay: Payer: Medicare HMO | Admitting: Hematology

## 2021-04-20 ENCOUNTER — Other Ambulatory Visit: Payer: Self-pay

## 2021-04-20 ENCOUNTER — Inpatient Hospital Stay: Payer: Medicare HMO

## 2021-04-20 ENCOUNTER — Other Ambulatory Visit: Payer: Self-pay | Admitting: Hematology

## 2021-04-20 ENCOUNTER — Encounter: Payer: Self-pay | Admitting: Hematology

## 2021-04-20 ENCOUNTER — Inpatient Hospital Stay: Payer: Medicare HMO | Attending: Hematology

## 2021-04-20 VITALS — BP 136/69 | HR 72 | Temp 98.2°F | Resp 16 | Wt 222.2 lb

## 2021-04-20 DIAGNOSIS — Z79899 Other long term (current) drug therapy: Secondary | ICD-10-CM | POA: Diagnosis not present

## 2021-04-20 DIAGNOSIS — Z452 Encounter for adjustment and management of vascular access device: Secondary | ICD-10-CM | POA: Diagnosis not present

## 2021-04-20 DIAGNOSIS — Z5111 Encounter for antineoplastic chemotherapy: Secondary | ICD-10-CM | POA: Diagnosis not present

## 2021-04-20 DIAGNOSIS — C221 Intrahepatic bile duct carcinoma: Secondary | ICD-10-CM | POA: Diagnosis not present

## 2021-04-20 DIAGNOSIS — Z7189 Other specified counseling: Secondary | ICD-10-CM

## 2021-04-20 DIAGNOSIS — Z95828 Presence of other vascular implants and grafts: Secondary | ICD-10-CM

## 2021-04-20 DIAGNOSIS — Z5112 Encounter for antineoplastic immunotherapy: Secondary | ICD-10-CM | POA: Insufficient documentation

## 2021-04-20 LAB — CMP (CANCER CENTER ONLY)
ALT: 20 U/L (ref 0–44)
AST: 35 U/L (ref 15–41)
Albumin: 3.5 g/dL (ref 3.5–5.0)
Alkaline Phosphatase: 57 U/L (ref 38–126)
Anion gap: 11 (ref 5–15)
BUN: 21 mg/dL (ref 8–23)
CO2: 23 mmol/L (ref 22–32)
Calcium: 9.5 mg/dL (ref 8.9–10.3)
Chloride: 104 mmol/L (ref 98–111)
Creatinine: 1.61 mg/dL — ABNORMAL HIGH (ref 0.61–1.24)
GFR, Estimated: 45 mL/min — ABNORMAL LOW (ref >60)
Glucose, Bld: 155 mg/dL — ABNORMAL HIGH (ref 70–99)
Potassium: 3.9 mmol/L (ref 3.5–5.1)
Sodium: 138 mmol/L (ref 135–145)
Total Bilirubin: 0.5 mg/dL (ref 0.3–1.2)
Total Protein: 7.4 g/dL (ref 6.5–8.1)

## 2021-04-20 LAB — CBC WITH DIFFERENTIAL (CANCER CENTER ONLY)
Abs Immature Granulocytes: 0.01 10*3/uL (ref 0.00–0.07)
Basophils Absolute: 0 10*3/uL (ref 0.0–0.1)
Basophils Relative: 1 %
Eosinophils Absolute: 0.1 10*3/uL (ref 0.0–0.5)
Eosinophils Relative: 2 %
HCT: 32.4 % — ABNORMAL LOW (ref 39.0–52.0)
Hemoglobin: 10.8 g/dL — ABNORMAL LOW (ref 13.0–17.0)
Immature Granulocytes: 0 %
Lymphocytes Relative: 16 %
Lymphs Abs: 0.7 10*3/uL (ref 0.7–4.0)
MCH: 33.9 pg (ref 26.0–34.0)
MCHC: 33.3 g/dL (ref 30.0–36.0)
MCV: 101.6 fL — ABNORMAL HIGH (ref 80.0–100.0)
Monocytes Absolute: 0.9 10*3/uL (ref 0.1–1.0)
Monocytes Relative: 20 %
Neutro Abs: 2.6 10*3/uL (ref 1.7–7.7)
Neutrophils Relative %: 61 %
Platelet Count: 165 10*3/uL (ref 150–400)
RBC: 3.19 MIL/uL — ABNORMAL LOW (ref 4.22–5.81)
RDW: 16.1 % — ABNORMAL HIGH (ref 11.5–15.5)
WBC Count: 4.2 10*3/uL (ref 4.0–10.5)
nRBC: 0 % (ref 0.0–0.2)

## 2021-04-20 MED ORDER — SODIUM CHLORIDE 0.9% FLUSH
10.0000 mL | Freq: Once | INTRAVENOUS | Status: AC
Start: 2021-04-20 — End: 2021-04-20
  Administered 2021-04-20: 10 mL
  Filled 2021-04-20: qty 10

## 2021-04-20 MED ORDER — SODIUM CHLORIDE 0.9 % IV SOLN
5000.0000 mg | INTRAVENOUS | Status: DC
  Administered 2021-04-20: 5000 mg via INTRAVENOUS
  Filled 2021-04-20: qty 100

## 2021-04-20 MED ORDER — DEXTROSE 5 % IV SOLN
Freq: Once | INTRAVENOUS | Status: AC
  Filled 2021-04-20: qty 250

## 2021-04-20 MED ORDER — PALONOSETRON HCL INJECTION 0.25 MG/5ML
0.2500 mg | Freq: Once | INTRAVENOUS | Status: AC
  Administered 2021-04-20: 0.25 mg via INTRAVENOUS

## 2021-04-20 MED ORDER — LEUCOVORIN CALCIUM INJECTION 350 MG
400.0000 mg/m2 | Freq: Once | INTRAVENOUS | Status: AC
  Administered 2021-04-20: 880 mg via INTRAVENOUS
  Filled 2021-04-20: qty 44

## 2021-04-20 MED ORDER — OXALIPLATIN CHEMO INJECTION 100 MG/20ML
75.0000 mg/m2 | Freq: Once | INTRAVENOUS | Status: AC
  Administered 2021-04-20: 165 mg via INTRAVENOUS
  Filled 2021-04-20: qty 33

## 2021-04-20 MED ORDER — PALONOSETRON HCL INJECTION 0.25 MG/5ML
INTRAVENOUS | Status: AC
  Filled 2021-04-20: qty 5

## 2021-04-20 NOTE — Patient Instructions (Signed)
[facility] ONCOLOGY    Discharge Instructions: Thank you for choosing [facility] to provide your oncology and hematology care.   If you have a lab appointment with the [facility], please go directly to the [facility] and check in at the registration area.   Wear comfortable clothing and clothing appropriate for easy access to any Portacath or PICC line.   We strive to give you quality time with your provider. You may need to reschedule your appointment if you arrive late (15 or more minutes).  Arriving late affects you and other patients whose appointments are after yours.  Also, if you miss three or more appointments without notifying the office, you may be dismissed from the clinic at the provider's discretion.      For prescription refill requests, have your pharmacy contact our office and allow 72 hours for refills to be completed.    Today you received the following chemotherapy and/or immunotherapy agents: oxaliplatin, leucovorin, and fluorouracil.     To help prevent nausea and vomiting after your treatment, we encourage you to take your nausea medication as directed.  BELOW ARE SYMPTOMS THAT SHOULD BE REPORTED IMMEDIATELY: . *FEVER GREATER THAN 100.4 F (38 C) OR HIGHER . *CHILLS OR SWEATING . *NAUSEA AND VOMITING THAT IS NOT CONTROLLED WITH YOUR NAUSEA MEDICATION . *UNUSUAL SHORTNESS OF BREATH . *UNUSUAL BRUISING OR BLEEDING . *URINARY PROBLEMS (pain or burning when urinating, or frequent urination) . *BOWEL PROBLEMS (unusual diarrhea, constipation, pain near the anus) . TENDERNESS IN MOUTH AND THROAT WITH OR WITHOUT PRESENCE OF ULCERS (sore throat, sores in mouth, or a toothache) . UNUSUAL RASH, SWELLING OR PAIN  . UNUSUAL VAGINAL DISCHARGE OR ITCHING   Items with * indicate a potential emergency and should be followed up as soon as possible or go to the Emergency Department if any problems should occur.  Please show the  CHEMOTHERAPY ALERT CARD or IMMUNOTHERAPY ALERT CARD at check-in to the Emergency Department and triage nurse.  Should you have questions after your visit or need to cancel or reschedule your appointment, please contact [facility]  Dept: [phone]  and follow the prompts.  Office hours are 8:00 a.m. to 4:30 p.m. Monday - Friday. Please note that voicemails left after 4:00 p.m. may not be returned until the following business day.  We are closed weekends and major holidays. You have access to a nurse at all times for urgent questions. Please call the main number to the clinic Dept: [phone] and follow the prompts.   For any non-urgent questions, you may also contact your provider using MyChart. We now offer e-Visits for anyone 69 and older to request care online for non-urgent symptoms. For details visit [email or web address].   Also download the MyChart app! Go to the app store, search "MyChart", open the app, select [facility], and log in with your MyChart username and password.  Due to Covid, a mask is required upon entering the hospital/clinic. If you do not have a mask, one will be given to you upon arrival. For doctor visits, patients may have 1 support person aged 69 or older with them. For treatment visits, patients cannot have anyone with them due to current Covid guidelines and our immunocompromised population.

## 2021-04-21 ENCOUNTER — Telehealth: Payer: Self-pay | Admitting: Hematology

## 2021-04-21 ENCOUNTER — Ambulatory Visit (HOSPITAL_COMMUNITY): Payer: Medicare HMO | Attending: Cardiology

## 2021-04-21 DIAGNOSIS — I482 Chronic atrial fibrillation, unspecified: Secondary | ICD-10-CM | POA: Insufficient documentation

## 2021-04-21 DIAGNOSIS — I351 Nonrheumatic aortic (valve) insufficiency: Secondary | ICD-10-CM | POA: Diagnosis not present

## 2021-04-21 LAB — ECHOCARDIOGRAM COMPLETE
AR max vel: 3.24 cm2
AV Area VTI: 3.31 cm2
AV Area mean vel: 3.46 cm2
AV Mean grad: 7.2 mmHg
AV Peak grad: 13.9 mmHg
Ao pk vel: 1.86 m/s
S' Lateral: 3.05 cm

## 2021-04-21 MED ORDER — PERFLUTREN LIPID MICROSPHERE
1.0000 mL | INTRAVENOUS | Status: AC | PRN
  Administered 2021-04-21: 1 mL via INTRAVENOUS

## 2021-04-21 NOTE — Telephone Encounter (Signed)
Left message with follow-up appointments per 6/1 los. Gave option to call back to reschedule if needed.

## 2021-04-22 ENCOUNTER — Other Ambulatory Visit: Payer: Self-pay

## 2021-04-22 ENCOUNTER — Inpatient Hospital Stay: Payer: Medicare HMO

## 2021-04-22 VITALS — BP 142/79 | HR 71 | Resp 18

## 2021-04-22 DIAGNOSIS — Z79899 Other long term (current) drug therapy: Secondary | ICD-10-CM | POA: Diagnosis not present

## 2021-04-22 DIAGNOSIS — Z5112 Encounter for antineoplastic immunotherapy: Secondary | ICD-10-CM | POA: Diagnosis not present

## 2021-04-22 DIAGNOSIS — C221 Intrahepatic bile duct carcinoma: Secondary | ICD-10-CM

## 2021-04-22 DIAGNOSIS — Z7189 Other specified counseling: Secondary | ICD-10-CM

## 2021-04-22 DIAGNOSIS — Z5111 Encounter for antineoplastic chemotherapy: Secondary | ICD-10-CM | POA: Diagnosis not present

## 2021-04-22 DIAGNOSIS — Z452 Encounter for adjustment and management of vascular access device: Secondary | ICD-10-CM | POA: Diagnosis not present

## 2021-04-22 MED ORDER — HEPARIN SOD (PORK) LOCK FLUSH 100 UNIT/ML IV SOLN
500.0000 [IU] | Freq: Once | INTRAVENOUS | Status: DC | PRN
  Filled 2021-04-22: qty 5

## 2021-04-22 MED ORDER — SODIUM CHLORIDE 0.9% FLUSH
10.0000 mL | INTRAVENOUS | Status: DC | PRN
  Filled 2021-04-22: qty 10

## 2021-04-22 NOTE — Patient Instructions (Signed)

## 2021-04-27 NOTE — Progress Notes (Signed)
[place]   Telephone:(336) [phone] Fax:(336) [phone]   Clinic Follow up Note   Patient Care Team: [name], PA as PCP - General (Family Medicine) Lorretta Harp, MD as PCP - Cardiology (Cardiology) [name], MD as Consulting Physician (General Surgery) [name], [name], MD as Consulting Physician (Gastroenterology) [name], MD as Consulting Physician (Hematology) Lorretta Harp, MD as Consulting Physician (Cardiology)  Date of Service:  05/02/2021  CHIEF COMPLAINT: F/u cholangiocarcinoma   SUMMARY OF ONCOLOGIC HISTORY: Oncology History Overview Note  Cancer Staging Intrahepatic cholangiocarcinoma ([place]) Staging form: Intrahepatic Bile Duct, AJCC 8th Edition - Clinical stage from 08/19/2019: Stage II (cT2, cN0, cM0) - Signed by [name], MD on 08/19/2019     Intrahepatic cholangiocarcinoma ([place])  06/28/2019 Imaging   CT AP W Contrast 06/28/19  IMPRESSION: 1. Heterogeneous hypodensity posteriorly in the right hepatic lobe and potentially extending into the caudate lobe suspicious for a mass. There is felt to be truncation of branches of the portal vein in this vicinity and some narrowing of the hepatic vein, as well as triangular-shaped regions of abnormal hypoenhancement posteriorly in the right hepatic lobe likely representing downstream vascular effects. Cannot exclude malignancy such as cholangiocarcinoma or hepatocellular carcinoma, and follow up hepatic protocol MRI with and without contrast is recommended to further characterize. 2. 4 mm right middle lobe pulmonary nodule is likely benign but may merit surveillance. 3. Cholelithiasis. 4.  Aortic Atherosclerosis (ICD10-I70.0). 5. Prostatomegaly. 6. Mild impingement at L3-4 and L4-5.   07/31/2019 Imaging   MRI Liver 07/31/19 IMPRESSION: 1. 7.3 cm in long axis mass in the right hepatic lobe spanning into the caudate lobe, high suspicion for malignancy such as hepatocellular  carcinoma or cholangiocarcinoma. Suspected effacement or occlusion of the right hepatic vein and posterior branches of the right portal vein. Two smaller tumor nodules along the posterior periphery of the dominant mass. Tissue diagnosis is recommended. 2. No findings of pathologic adenopathy or distant metastatic spread. 3. 9 mm gallstone in the gallbladder. There is mild gallbladder wall thickening which may be from nondistention, correlate clinically in assessing for cholecystitis. 4.  Aortic Atherosclerosis (ICD10-I70.0). 5. Mild diffuse hepatic steatosis.   08/11/2019 Initial Biopsy   DIAGNOSIS: 08/11/19  A. LIVER, RIGHT, BIOPSY:  - Adenocarcinoma.   08/18/2019 Imaging   CT Chest 08/18/19  IMPRESSION: 1. Multiple pulmonary nodules largest at approximately 7 mm in the right lower lobe, nonspecific but concerning given findings in the liver. 2. No signs of definitive metastatic disease, also with mildly enlarged upper abdominal lymph nodes as discussed.   Aortic Atherosclerosis (ICD10-I70.0).   08/19/2019 Initial Diagnosis   Intrahepatic cholangiocarcinoma ([place])    08/19/2019 Cancer Staging   Staging form: Intrahepatic Bile Duct, AJCC 8th Edition - Clinical stage from 08/19/2019: Stage II (cT2, cN0, cM0) - Signed by [name], MD on 08/19/2019    09/29/2019 - 07/19/2020 Chemotherapy   Cisplatin and Gemcitabine 2 weeks on/1 week off starting 09/29/19. Cisplatin held from cycle 9 (03/23/20) due to fluid status/Afib. He is now on maintenance Gemcitabine. Held after 07/19/20 to proceed with liver target therapy.     10/09/2019 Imaging   CT AP IMPRESSION: 1. The dominant right hepatic lobe mass is minimally reduced in size compared to prior exams, currently measuring 6.2 by 5.3 cm, previously 6.2 by 5.6 cm. However, there is a new small hypodense lesion centrally in the right hepatic lobe which is suspicious for a new small focus of tumor. Accordingly this is an  overall mixed  appearance. 2. Continued hypoenhancement in the liver downstream of the tumor likely attributable to narrowing or occlusion of the right hepatic vein by the tumor. By virtue of its location the tumor wraps around the intrahepatic portion of the IVC. 3. 4 mm right middle lobe pulmonary nodule, stable compared to earliest available comparison of 07/18/2019. Surveillance of the patient's pulmonary nodules is recommended. 4. Other imaging findings of potential clinical significance: Coronary atherosclerosis. Cholelithiasis. Prominent stool throughout the colon favors constipation. Moderate prostatomegaly with heterogeneous enhancement of the prostate gland. Lumbar spondylosis and degenerative disc disease causing mild bilateral foraminal impingement at L3-4 and L4-5.   Aortic Atherosclerosis (ICD10-I70.0).   12/24/2019 Imaging   CT CAP W Contrast  IMPRESSION: 1. Right hepatic lobe mass and adjacent right hepatic lobe nodules appear grossly stable. No evidence of distant metastatic disease. 2. Continued stability of small pulmonary nodules. Recommend attention on follow-up. 3. Cholelithiasis. 4. Enlarged prostate. 5. Aortic atherosclerosis (ICD10-I70.0). Coronary artery calcification.   02/23/2020 Procedure   He had PAC placed on 02/23/20.    04/08/2020 Imaging   CT CAP  IMPRESSION: 1. Stable or minimally decreased size of a very ill-defined, hypodense and somewhat retractile appearing mass of the central right lobe of the liver abutting the inferior vena cava and right portal vein, measuring approximately 5.7 x 5.0 cm, previously 6.2 x 5.2 cm when measured similarly. Findings are consistent with stable or minimally improved cholangiocarcinoma. 2. Small hypodense nodules of the right lobe identified on prior examination are poorly appreciated on this single phase contrast examination although not grossly changed. Attention on follow-up. 3. There are new, moderate bilateral pleural effusions  and associated atelectasis or consolidation as well as a new small pericardial effusion, nonspecific although generally concerning and suspicious for malignant effusions. There is no directly visualized pleural nodularity. 4. Multiple small pulmonary nodules are stable.  No new nodules. 5. Coronary artery disease. Aortic Atherosclerosis (ICD10-I70.0).   07/06/2020 Imaging   MRI ABDIMPRESSION: 1. Interval decrease in size of the right hepatic lobe lesion in the medial aspect of the segment 7. Findings would suggest a good response to treatment with some contraction of the tumor. 2. No new hepatic lesions. No abdominal adenopathy or metastatic disease. 3. Stable mild intrahepatic biliary dilatation in the right hepatic lobe distal to the lesion. 4. Somewhat tortuous and almost beaded appearance of the hepatic and portal vein radicles. Findings could be radiation related. 5. Cholelithiasis.  CT chest wo contrast IMPRESSION: 1. Persistent/stable moderate-sized bilateral pleural effusions with overlying atelectasis. 2. Stable small right pulmonary nodules. No new or progressive findings. Recommend continued surveillance. 3. No mediastinal or hilar mass or adenopathy. 4. Stable advanced three-vessel coronary artery calcifications. 5. Cholelithiasis. Aortic Atherosclerosis (ICD10-I70.0)   08/11/2020 Procedure   Y90 on 08/11/20 and 08/26/20 with Dr Laurence Ferrari    11/30/2020 Imaging   MRI Abdomen  IMPRESSION: 1. Today's study demonstrates progression of disease with enlarging central lesion in the right lobe of the liver involving portions of segments 7 and 8, now with evidence of some tumor thrombus extension into the intrahepatic portion of the inferior vena cava. This is associated with increasing intrahepatic biliary ductal dilatation in segment 7 of the liver. 2. In addition, there is some subtle hyperenhancement of the distal common bile duct at and immediately proximally to the  level of the ampulla. There is also some subtle delayed enhancement around this region in the pancreatic head. This is of uncertain etiology and significance, but may be  inflammatory and currently is not associated with proximal common bile duct dilatation. However, close attention on follow-up imaging is recommended. 3. Cholelithiasis without evidence of acute cholecystitis at this time. 4. Aortic atherosclerosis.     12/24/2020 - 03/21/2021 Chemotherapy   Restart Gemcitabine 2 weeks on/1 week off given disease progression beginning 12/24/20. Discontinued after 03/21/21 due to disease progression in liver.     03/16/2021 Imaging   MRI abdomen  IMPRESSION: No significant change in size of irregular hypovascular mass in the medial right hepatic lobe.   New 1.1 cm hypovascular lesion with peripheral rim enhancement in liver dome, suspicious for metastatic disease.   New small right pleural effusion and mild ascites.   Cholelithiasis, without evidence of cholecystitis or biliary dilatation.   04/05/2021 -  Chemotherapy   Second-line FOLFOX q2weeks starting 04/05/21    04/05/2021 -  Chemotherapy   Immunotherapy with Durvalumab (Imfinzi) q4weeks starting 04/05/21. (in addition to second line FOLFOX)      CURRENT THERAPY:  Second-line FOLFOX q2weeks starting 04/05/21 Additional Durvalumab (Imfinzi) q4weeks starting 04/05/21.   INTERVAL HISTORY:  Brandon Sherman is here for a follow up. He was last seen by me 04/20/21. I presents to the clinic with his wife. He notes he has been working in his yard much more and able to lose weight with this. He notes his appetite and eating is adequate. He notes his baseline weight is 220-230 pounds. He notes he has tolerated start of Imfinzi and FOLFOX. He denies LE edema and breathing is adequate. I reviewed his medication list with him and there are no changes.     REVIEW OF SYSTEMS:   Constitutional: Denies fevers, chills or abnormal weight  loss Eyes: Denies blurriness of vision Ears, nose, mouth, throat, and face: Denies mucositis or sore throat Respiratory: Denies cough, dyspnea or wheezes Cardiovascular: Denies palpitation, chest discomfort or lower extremity swelling Gastrointestinal:  Denies nausea, heartburn or change in bowel habits Skin: Denies abnormal skin rashes Lymphatics: Denies new lymphadenopathy or easy bruising Neurological:Denies numbness, tingling or new weaknesses Behavioral/Psych: Mood is stable, no new changes  All other systems were reviewed with the patient and are negative.  MEDICAL HISTORY:  Past Medical History:  Diagnosis Date   Arthritis    Diabetes ([place])    GERD (gastroesophageal reflux disease)    Hyperlipidemia    Hypertension    Intrahepatic cholangiocarcinoma ([place])     SURGICAL HISTORY: Past Surgical History:  Procedure Laterality Date   APPENDECTOMY  1980   CARDIOVERSION N/A 04/27/2020   Procedure: CARDIOVERSION;  Surgeon: Dorothy Spark, MD;  Location: [facility];  Service: Cardiovascular;  Laterality: N/A;   CARDIOVERSION N/A 05/19/2020   Procedure: CARDIOVERSION;  Surgeon: Elouise Munroe, MD;  Location: [facility];  Service: Cardiovascular;  Laterality: N/A;   COLONOSCOPY     IR 3D INDEPENDENT WKST  08/11/2020   IR 3D INDEPENDENT WKST  08/11/2020   IR ANGIOGRAM SELECTIVE EACH ADDITIONAL VESSEL  08/11/2020   IR ANGIOGRAM SELECTIVE EACH ADDITIONAL VESSEL  08/11/2020   IR ANGIOGRAM SELECTIVE EACH ADDITIONAL VESSEL  08/11/2020   IR ANGIOGRAM SELECTIVE EACH ADDITIONAL VESSEL  08/11/2020   IR ANGIOGRAM SELECTIVE EACH ADDITIONAL VESSEL  08/11/2020   IR ANGIOGRAM SELECTIVE EACH ADDITIONAL VESSEL  08/11/2020   IR ANGIOGRAM SELECTIVE EACH ADDITIONAL VESSEL  08/26/2020   IR ANGIOGRAM SELECTIVE EACH ADDITIONAL VESSEL  08/26/2020   IR ANGIOGRAM VISCERAL SELECTIVE  08/11/2020   IR ANGIOGRAM VISCERAL SELECTIVE  08/26/2020   IR EMBO  ARTERIAL NOT HEMORR HEMANG INC GUIDE ROADMAPPING   08/11/2020   IR EMBO TUMOR ORGAN ISCHEMIA INFARCT INC GUIDE ROADMAPPING  08/26/2020   IR IMAGING GUIDED PORT INSERTION  02/23/2020   IR RADIOLOGIST EVAL & MGMT  07/14/2020   IR RADIOLOGIST EVAL & MGMT  09/30/2020   IR RADIOLOGIST EVAL & MGMT  12/01/2020   IR US GUIDE VASC ACCESS LEFT  08/26/2020   IR US GUIDE VASC ACCESS RIGHT  08/11/2020    I have reviewed the social history and family history with the patient and they are unchanged from previous note.  ALLERGIES:  has No Known Allergies.  MEDICATIONS:  Current Outpatient Medications  Medication Sig Dispense Refill   allopurinol (ZYLOPRIM) 100 MG tablet Take 100 mg by mouth daily.     amLODipine (NORVASC) 5 MG tablet      colchicine 0.6 MG tablet Take 1 tablet (0.6 mg total) by mouth daily as needed (gout flare). Take daily for 3 days, then as needed for gout flare 30 tablet 0   diltiazem (CARDIZEM CD) 240 MG 24 hr capsule Take 1 capsule (240 mg total) by mouth daily. 90 capsule 3   ELIQUIS 5 MG TABS tablet TAKE 1 TABLET TWICE DAILY 180 tablet 1   fenofibrate (TRICOR) 145 MG tablet Take 145 mg by mouth daily.     finasteride (PROSCAR) 5 MG tablet Take 5 mg by mouth daily.     furosemide (LASIX) 40 MG tablet Take 40 mg twice a day 90 tablet 3   gabapentin (NEURONTIN) 100 MG capsule Take 100 mg by mouth at bedtime.     hydrALAZINE (APRESOLINE) 50 MG tablet TAKE 2 TABLETS BY MOUTH IN THE MORNING, 1 TABLET AT LUNCH AND 2 IN THE EVENING 450 tablet 1   hydrALAZINE (APRESOLINE) 50 MG tablet Take 50 mg by mouth 2 (two) times daily.     KLOR-CON M20 20 MEQ tablet TAKE 1 TABLET BY MOUTH TWICE A DAY 60 tablet 1   KLOR-CON M20 20 MEQ tablet TAKE 1 TABLET BY MOUTH TWICE A DAY 180 tablet 1   lidocaine-prilocaine (EMLA) cream Apply 1 application topically as needed. 30 g 3   magnesium oxide (MAG-OX) 400 MG tablet Take 1 tablet (400 mg total) by mouth daily. 30 tablet 0   metoprolol succinate (TOPROL XL) 25 MG 24 hr tablet Take 1 tablet (25 mg total) by  mouth daily. 90 tablet 3   metoprolol tartrate (LOPRESSOR) 50 MG tablet      pantoprazole (PROTONIX) 40 MG tablet TAKE 1 TABLET BY MOUTH TWICE A DAY 180 tablet 2   pravastatin (PRAVACHOL) 40 MG tablet Take 40 mg by mouth daily.     sucralfate (CARAFATE) 1 g tablet Take 1 tablet (1 g total) by mouth every 6 (six) hours as needed. Please schedule a yearly follow up for further refills: [phone] 60 tablet 0   tamsulosin (FLOMAX) 0.4 MG CAPS capsule Take 0.4 mg by mouth daily.     No current facility-administered medications for this visit.    PHYSICAL EXAMINATION: ECOG PERFORMANCE STATUS: 1 - Symptomatic but completely ambulatory  Vitals:   05/02/21 0854  BP: (!) 154/65  Pulse: 71  Resp: 18  Temp: 97.7 F (36.5 C)  SpO2: 99%   Filed Weights   05/02/21 0854  Weight: 215 lb 8 oz (97.8 kg)    GENERAL:alert, no distress and comfortable SKIN: skin color, texture, turgor are normal, no rashes or significant lesions EYES: normal, Conjunctiva are pink and non-injected,  sclera clear  NECK: supple, thyroid normal size, non-tender, without nodularity LYMPH:  no palpable lymphadenopathy in the cervical, axillary  LUNGS: clear to auscultation and percussion with normal breathing effort HEART: regular rate & rhythm and no murmurs and no lower extremity edema ABDOMEN:abdomen soft, non-tender and normal bowel sounds (+) Mild hepatomegaly, no tenderness Musculoskeletal:no cyanosis of digits and no clubbing  NEURO: alert & oriented x 3 with fluent speech, no focal motor/sensory deficits  LABORATORY DATA:  I have reviewed the data as listed CBC Latest Ref Rng & Units 05/02/2021 04/20/2021 04/04/2021  WBC 4.0 - 10.5 K/uL 4.0 4.2 5.8  Hemoglobin 13.0 - 17.0 g/dL 11.3(L) 10.8(L) 10.7(L)  Hematocrit 39.0 - 52.0 % 34.7(L) 32.4(L) 33.6(L)  Platelets 150 - 400 K/uL 146(L) 165 171     CMP Latest Ref Rng & Units 04/20/2021 04/04/2021 03/21/2021  Glucose 70 - 99 mg/dL 155(H) 114(H) 118(H)  BUN 8 - 23  mg/dL $Remo'21 15 19  'RWnfi$ Creatinine 0.61 - 1.24 mg/dL 1.61(H) 1.42(H) 1.62(H)  Sodium 135 - 145 mmol/L 138 139 138  Potassium 3.5 - 5.1 mmol/L 3.9 4.2 4.0  Chloride 98 - 111 mmol/L 104 106 101  CO2 22 - 32 mmol/L $RemoveB'23 25 26  'BPcYcPDj$ Calcium 8.9 - 10.3 mg/dL 9.5 9.1 9.7  Total Protein 6.5 - 8.1 g/dL 7.4 6.8 7.0  Total Bilirubin 0.3 - 1.2 mg/dL 0.5 0.6 0.6  Alkaline Phos 38 - 126 U/L 57 49 66  AST 15 - 41 U/L 35 27 25  ALT 0 - 44 U/L $Remo'20 18 14      'OkIba$ RADIOGRAPHIC STUDIES: I have personally reviewed the radiological images as listed and agreed with the findings in the report. No results found.   ASSESSMENT & PLAN:  JOSSIAH SMOAK is a 75 y.o. male with    1. Intrahepatic cholangiocarcinoma, cT2N0M0, stage II, unresectable, with indeterminate lung nodules  -He was diagnosed in 07/2019. CT scans and MRI liver show a large 7.3cm mass in the right hepatic lobe which abuts portal vein.  -He was seen by our local surgeon Dr. Barry Dienes and Dr Carlis Abbott at [facility], both concluded that cancer is not resectable due to the invasion to portal vein. Given cancer is non-resectable his cancer is likely not curable but still treatable. -He was treated with standard first line chemo with IV Cisplatin and Gemcitabine 2 weeks on/1 week off beginning 09/29/19. Cisplatin was held since C9 (03/23/20) due to fluid status/AFib. He continued maintenance Gemcitabine until 07/19/20. He proceeded with Y90 on 08/11/20 and 08/26/20.  -His MRI Abdomen from 12/11/20 showed progression of disease in liver with tumor thrombus extension into IVC. Given disease progression, Dr Geroge Baseman does not recommend more Y90 or liver target therapy at this point.  -To control his disease I restarted Single agent Gemcitabine 2 weeks on/1 week off beginning 12/24/20.  -He has IDH-1 mutation, which makes him eligible for an oral IDH inhibitor drug Ivosidenib.  -Based on 03/16/21 MRI he progressed with New 1.1 cm hypovascular lesion suspicious for metastatic disease. -He is  not a candidate for cisplatin due to his CKD and CHF. I changed him to second-line chemo with FOLFOX q2weeks and added immunotherapy Durvalumab (Imfinzi) q3-4weeks starting 04/05/21. Goal of chemo is to control his disease and prolong his life.  -S/p C2 he is tolerating current regimen well overall. Labs reviewed, CBC showed stable mild anemia, CMP still pending. Overall adequate to proceed with Imfinzi and C3 FOLFOX today if stable CMP   -Continue FOLFOX q2weeks  and Imfinzi q4weeks.  -F/u in 2 weeks. Will order scan at next visit.      2. DM, HTN, HLD, Gout, LE edema -On Metformin, amlodipine, lisinopril., allopurinol, Lasix. Continue to f/u with his PCP and cardiologist.  -His PCP has started him on Janumet for his feet burning.    3. AF and CHF -With PAC placement on 02/23/20 he was found to have Afib. Based on CHADS2 Score 2. He is at moderate risk for stroke.  -He underwent Cardioversion with Dr. Meda Coffee on 04/27/20. After CHF in late 04/2020 he had another cardioversion on 05/19/20. He has recovered well.  -His Afib is intermittent. He will continue his medications and f/u with cardiologist. He is on Eliquis    4. Nicotine Use -He never smoked but has been chewing Tobacco for the past 60 years. He no longer drinks alcohol.  -He currently chews 1-2 times a day. I  have repeatedly discussed and encouraged smoking cessation.    5. GERD and gastritis, history of esophageal candidiasis, DM  -He had repeated EGD with Dr. Havery Moros in 07/2019. 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  -Will monitor and may repeat EGD in future  -He is not currently on DM medication. I encouraged him to watch his diet and reduce sugar.  -He has been more active with    PLAN:   -Labs reviewed, CMP still pending, if adequate, will proceed with Imfinzi and C3 FOLFOX today  -Lab, flush, f/u and FOLFOX in 2, 4, 6, 8 weeks.   -Imfinzi in 4 and 8 weeks.  -plan to repeat scan after cyle 5 or 6 chemo    No problem-specific Assessment & Plan notes found for this encounter.   Orders Placed This Encounter  Procedures   CBC with Differential ([facility] Only)    Standing Status:   Standing    Number of Occurrences:   50    Standing Expiration Date:   05/02/2022   CMP ([facility] only)    Standing Status:   Standing    Number of Occurrences:   50    Standing Expiration Date:   05/02/2022   Magnesium    Standing Status:   Standing    Number of Occurrences:   20    Standing Expiration Date:   05/02/2022   All questions were answered. The patient knows to call the clinic with any problems, questions or concerns. No barriers to learning was detected. The total time spent in the appointment was 30 minutes.     [name], MD 05/02/2021   I, Joslyn Devon, am acting as scribe for [name], MD.   I have reviewed the above documentation for accuracy and completeness, and I agree with the above.

## 2021-04-29 ENCOUNTER — Other Ambulatory Visit: Payer: Self-pay

## 2021-04-29 ENCOUNTER — Other Ambulatory Visit: Payer: Self-pay | Admitting: Nurse Practitioner

## 2021-04-29 DIAGNOSIS — I10 Essential (primary) hypertension: Secondary | ICD-10-CM

## 2021-04-29 DIAGNOSIS — C221 Intrahepatic bile duct carcinoma: Secondary | ICD-10-CM

## 2021-05-02 ENCOUNTER — Other Ambulatory Visit: Payer: Self-pay | Admitting: *Deleted

## 2021-05-02 ENCOUNTER — Inpatient Hospital Stay: Payer: Medicare HMO

## 2021-05-02 ENCOUNTER — Telehealth: Payer: Self-pay | Admitting: Hematology

## 2021-05-02 ENCOUNTER — Other Ambulatory Visit: Payer: Self-pay

## 2021-05-02 ENCOUNTER — Inpatient Hospital Stay (HOSPITAL_BASED_OUTPATIENT_CLINIC_OR_DEPARTMENT_OTHER): Payer: Medicare HMO | Admitting: Hematology

## 2021-05-02 ENCOUNTER — Encounter: Payer: Self-pay | Admitting: Hematology

## 2021-05-02 VITALS — BP 154/65 | HR 71 | Temp 97.7°F | Resp 18 | Ht 69.0 in | Wt 215.5 lb

## 2021-05-02 DIAGNOSIS — Z5111 Encounter for antineoplastic chemotherapy: Secondary | ICD-10-CM | POA: Diagnosis not present

## 2021-05-02 DIAGNOSIS — C221 Intrahepatic bile duct carcinoma: Secondary | ICD-10-CM

## 2021-05-02 DIAGNOSIS — Z452 Encounter for adjustment and management of vascular access device: Secondary | ICD-10-CM | POA: Diagnosis not present

## 2021-05-02 DIAGNOSIS — Z5112 Encounter for antineoplastic immunotherapy: Secondary | ICD-10-CM | POA: Diagnosis not present

## 2021-05-02 DIAGNOSIS — Z79899 Other long term (current) drug therapy: Secondary | ICD-10-CM | POA: Diagnosis not present

## 2021-05-02 DIAGNOSIS — Z95828 Presence of other vascular implants and grafts: Secondary | ICD-10-CM

## 2021-05-02 DIAGNOSIS — Z7189 Other specified counseling: Secondary | ICD-10-CM

## 2021-05-02 LAB — CMP (CANCER CENTER ONLY)
ALT: 18 U/L (ref 0–44)
AST: 29 U/L (ref 15–41)
Albumin: 3.7 g/dL (ref 3.5–5.0)
Alkaline Phosphatase: 57 U/L (ref 38–126)
Anion gap: 10 (ref 5–15)
BUN: 24 mg/dL — ABNORMAL HIGH (ref 8–23)
CO2: 21 mmol/L — ABNORMAL LOW (ref 22–32)
Calcium: 9.7 mg/dL (ref 8.9–10.3)
Chloride: 104 mmol/L (ref 98–111)
Creatinine: 1.79 mg/dL — ABNORMAL HIGH (ref 0.61–1.24)
GFR, Estimated: 40 mL/min — ABNORMAL LOW (ref >60)
Glucose, Bld: 137 mg/dL — ABNORMAL HIGH (ref 70–99)
Potassium: 4.2 mmol/L (ref 3.5–5.1)
Sodium: 135 mmol/L (ref 135–145)
Total Bilirubin: 0.5 mg/dL (ref 0.3–1.2)
Total Protein: 7.4 g/dL (ref 6.5–8.1)

## 2021-05-02 LAB — CBC WITH DIFFERENTIAL (CANCER CENTER ONLY)
Abs Immature Granulocytes: 0.01 10*3/uL (ref 0.00–0.07)
Basophils Absolute: 0 10*3/uL (ref 0.0–0.1)
Basophils Relative: 1 %
Eosinophils Absolute: 0 10*3/uL (ref 0.0–0.5)
Eosinophils Relative: 1 %
HCT: 34.7 % — ABNORMAL LOW (ref 39.0–52.0)
Hemoglobin: 11.3 g/dL — ABNORMAL LOW (ref 13.0–17.0)
Immature Granulocytes: 0 %
Lymphocytes Relative: 20 %
Lymphs Abs: 0.8 10*3/uL (ref 0.7–4.0)
MCH: 32.9 pg (ref 26.0–34.0)
MCHC: 32.6 g/dL (ref 30.0–36.0)
MCV: 101.2 fL — ABNORMAL HIGH (ref 80.0–100.0)
Monocytes Absolute: 0.8 10*3/uL (ref 0.1–1.0)
Monocytes Relative: 19 %
Neutro Abs: 2.4 10*3/uL (ref 1.7–7.7)
Neutrophils Relative %: 59 %
Platelet Count: 146 10*3/uL — ABNORMAL LOW (ref 150–400)
RBC: 3.43 MIL/uL — ABNORMAL LOW (ref 4.22–5.81)
RDW: 16 % — ABNORMAL HIGH (ref 11.5–15.5)
WBC Count: 4 10*3/uL (ref 4.0–10.5)
nRBC: 0 % (ref 0.0–0.2)

## 2021-05-02 LAB — TSH: TSH: 2.966 u[IU]/mL (ref 0.320–4.118)

## 2021-05-02 LAB — MAGNESIUM: Magnesium: 2.1 mg/dL (ref 1.7–2.4)

## 2021-05-02 MED ORDER — PALONOSETRON HCL INJECTION 0.25 MG/5ML
INTRAVENOUS | Status: AC
  Filled 2021-05-02: qty 5

## 2021-05-02 MED ORDER — HEPARIN SOD (PORK) LOCK FLUSH 100 UNIT/ML IV SOLN
500.0000 [IU] | Freq: Once | INTRAVENOUS | Status: DC | PRN
  Filled 2021-05-02: qty 5

## 2021-05-02 MED ORDER — PALONOSETRON HCL INJECTION 0.25 MG/5ML
0.2500 mg | Freq: Once | INTRAVENOUS | Status: AC
  Administered 2021-05-02: 0.25 mg via INTRAVENOUS

## 2021-05-02 MED ORDER — LEUCOVORIN CALCIUM INJECTION 350 MG
400.0000 mg/m2 | Freq: Once | INTRAVENOUS | Status: AC
  Administered 2021-05-02: 880 mg via INTRAVENOUS
  Filled 2021-05-02: qty 44

## 2021-05-02 MED ORDER — SODIUM CHLORIDE 0.9 % IV SOLN
1500.0000 mg | Freq: Once | INTRAVENOUS | Status: AC
  Administered 2021-05-02: 1500 mg via INTRAVENOUS
  Filled 2021-05-02: qty 30

## 2021-05-02 MED ORDER — OXALIPLATIN CHEMO INJECTION 100 MG/20ML
75.0000 mg/m2 | Freq: Once | INTRAVENOUS | Status: AC
  Administered 2021-05-02: 165 mg via INTRAVENOUS
  Filled 2021-05-02: qty 33

## 2021-05-02 MED ORDER — SODIUM CHLORIDE 0.9% FLUSH
10.0000 mL | Freq: Once | INTRAVENOUS | Status: AC
Start: 2021-05-02 — End: 2021-05-02
  Administered 2021-05-02: 10 mL
  Filled 2021-05-02: qty 10

## 2021-05-02 MED ORDER — SODIUM CHLORIDE 0.9 % IV SOLN
Freq: Once | INTRAVENOUS | Status: AC
  Filled 2021-05-02: qty 250

## 2021-05-02 MED ORDER — DEXTROSE 5 % IV SOLN
Freq: Once | INTRAVENOUS | Status: AC
  Filled 2021-05-02: qty 250

## 2021-05-02 MED ORDER — SODIUM CHLORIDE 0.9% FLUSH
10.0000 mL | INTRAVENOUS | Status: DC | PRN
  Filled 2021-05-02: qty 10

## 2021-05-02 MED ORDER — SODIUM CHLORIDE 0.9 % IV SOLN
5000.0000 mg | INTRAVENOUS | Status: DC
  Administered 2021-05-02: 5000 mg via INTRAVENOUS
  Filled 2021-05-02: qty 100

## 2021-05-02 NOTE — Progress Notes (Signed)
Per Dr. Burr Medico, ok to treat with elevated scr.

## 2021-05-02 NOTE — Progress Notes (Signed)
5FU dose 2400 mg/m2; calculated dose is 5232 mg. Rounded dose to 5000 mg for 5FU pump.   Larene Beach, PharmD

## 2021-05-02 NOTE — Patient Instructions (Signed)
Brandon Sherman    Discharge Instructions: Thank you for choosing [facility] to provide your Sherman and hematology care.   If you have a lab appointment with the [facility], please go directly to the [facility] and check in at the registration area.   Wear comfortable clothing and clothing appropriate for easy access to any Portacath or PICC line.   We strive to give you quality time with your provider. You may need to reschedule your appointment if you arrive late (15 or more minutes).  Arriving late affects you and other patients whose appointments are after yours.  Also, if you miss three or more appointments without notifying the office, you may be dismissed from the clinic at the provider's discretion.      For prescription refill requests, have your pharmacy contact our office and allow 72 hours for refills to be completed.    Today you received the following chemotherapy and/or immunotherapy agents: oxaliplatin, leucovorin, and fluorouracil.     To help prevent nausea and vomiting after your treatment, we encourage you to take your nausea medication as directed.  BELOW ARE SYMPTOMS THAT SHOULD BE REPORTED IMMEDIATELY: *FEVER GREATER THAN 100.4 F (38 C) OR HIGHER *CHILLS OR SWEATING *NAUSEA AND VOMITING THAT IS NOT CONTROLLED WITH YOUR NAUSEA MEDICATION *UNUSUAL SHORTNESS OF BREATH *UNUSUAL BRUISING OR BLEEDING *URINARY PROBLEMS (pain or burning when urinating, or frequent urination) *BOWEL PROBLEMS (unusual diarrhea, constipation, pain near the anus) TENDERNESS IN MOUTH AND THROAT WITH OR WITHOUT PRESENCE OF ULCERS (sore throat, sores in mouth, or a toothache) UNUSUAL RASH, SWELLING OR PAIN  UNUSUAL VAGINAL DISCHARGE OR ITCHING   Items with * indicate a potential emergency and should be followed up as soon as possible or go to the Emergency Department if any problems should occur.  Please show the CHEMOTHERAPY ALERT CARD or  IMMUNOTHERAPY ALERT CARD at check-in to the Emergency Department and triage nurse.  Should you have questions after your visit or need to cancel or reschedule your appointment, please contact [facility]  Dept: [phone]  and follow the prompts.  Office hours are 8:00 a.m. to 4:30 p.m. Monday - Friday. Please note that voicemails left after 4:00 p.m. may not be returned until the following business day.  We are closed weekends and major holidays. You have access to a nurse at all times for urgent questions. Please call the main number to the clinic Dept: [phone] and follow the prompts.   For any non-urgent questions, you may also contact your provider using MyChart. We now offer e-Visits for anyone 84 and older to request care online for non-urgent symptoms. For details visit [email or web address].   Also download the MyChart app! Go to the app store, search "MyChart", open the app, select [facility], and log in with your MyChart username and password.  Due to Covid, a mask is required upon entering the hospital/clinic. If you do not have a mask, one will be given to you upon arrival. For doctor visits, patients may have 1 support person aged 72 or older with them. For treatment visits, patients cannot have anyone with them due to current Covid guidelines and our immunocompromised population.

## 2021-05-02 NOTE — Telephone Encounter (Signed)
Scheduled per los. Gave avs and calendar  

## 2021-05-03 LAB — T4: T4, Total: 12.4 ug/dL — ABNORMAL HIGH (ref 4.5–12.0)

## 2021-05-04 ENCOUNTER — Other Ambulatory Visit: Payer: Self-pay

## 2021-05-04 ENCOUNTER — Inpatient Hospital Stay: Payer: Medicare HMO

## 2021-05-04 VITALS — BP 132/63 | HR 64 | Temp 98.4°F | Resp 20

## 2021-05-04 DIAGNOSIS — C221 Intrahepatic bile duct carcinoma: Secondary | ICD-10-CM | POA: Diagnosis not present

## 2021-05-04 DIAGNOSIS — Z7189 Other specified counseling: Secondary | ICD-10-CM

## 2021-05-04 DIAGNOSIS — Z5111 Encounter for antineoplastic chemotherapy: Secondary | ICD-10-CM | POA: Diagnosis not present

## 2021-05-04 DIAGNOSIS — Z79899 Other long term (current) drug therapy: Secondary | ICD-10-CM | POA: Diagnosis not present

## 2021-05-04 DIAGNOSIS — Z5112 Encounter for antineoplastic immunotherapy: Secondary | ICD-10-CM | POA: Diagnosis not present

## 2021-05-04 DIAGNOSIS — Z452 Encounter for adjustment and management of vascular access device: Secondary | ICD-10-CM | POA: Diagnosis not present

## 2021-05-04 MED ORDER — SODIUM CHLORIDE 0.9% FLUSH
10.0000 mL | INTRAVENOUS | Status: DC | PRN
  Administered 2021-05-04: 10 mL
  Filled 2021-05-04: qty 10

## 2021-05-04 MED ORDER — HEPARIN SOD (PORK) LOCK FLUSH 100 UNIT/ML IV SOLN
500.0000 [IU] | Freq: Once | INTRAVENOUS | Status: AC | PRN
  Administered 2021-05-04: 500 [IU]
  Filled 2021-05-04: qty 5

## 2021-05-06 DIAGNOSIS — C221 Intrahepatic bile duct carcinoma: Secondary | ICD-10-CM | POA: Diagnosis not present

## 2021-05-12 ENCOUNTER — Other Ambulatory Visit: Payer: Self-pay | Admitting: Cardiovascular Disease

## 2021-05-17 NOTE — Progress Notes (Signed)
Brandon Sherman   Telephone:([phone]) [phone] Fax:(336) [phone]   Clinic Follow up Note   Patient Care Team: [name], PA as PCP - General (Family Medicine) Lorretta Harp, MD as PCP - Cardiology (Cardiology) [name], MD as Consulting Physician (General Surgery) [name], [name], MD as Consulting Physician (Gastroenterology) [name], MD as Consulting Physician (Hematology) Lorretta Harp, MD as Consulting Physician (Cardiology)  Date of Service:  05/18/2021  CHIEF COMPLAINT: F/u cholangiocarcinoma   SUMMARY OF ONCOLOGIC HISTORY: Oncology History Overview Note  Cancer Staging Intrahepatic cholangiocarcinoma ([place]) Staging form: Intrahepatic Bile Duct, AJCC 8th Edition - Clinical stage from 08/19/2019: Stage II (cT2, cN0, cM0) - Signed by [name], MD on 08/19/2019     Intrahepatic cholangiocarcinoma ([place])  06/28/2019 Imaging   CT AP W Contrast 06/28/19  IMPRESSION: 1. Heterogeneous hypodensity posteriorly in the right hepatic lobe and potentially extending into the caudate lobe suspicious for a mass. There is felt to be truncation of branches of the portal vein in this vicinity and some narrowing of the hepatic vein, as well as triangular-shaped regions of abnormal hypoenhancement posteriorly in the right hepatic lobe likely representing downstream vascular effects. Cannot exclude malignancy such as cholangiocarcinoma or hepatocellular carcinoma, and follow up hepatic protocol MRI with and without contrast is recommended to further characterize. 2. 4 mm right middle lobe pulmonary nodule is likely benign but may merit surveillance. 3. Cholelithiasis. 4.  Aortic Atherosclerosis (ICD10-I70.0). 5. Prostatomegaly. 6. Mild impingement at L3-4 and L4-5.   07/31/2019 Imaging   MRI Liver 07/31/19 IMPRESSION: 1. 7.3 cm in long axis mass in the right hepatic lobe spanning into the caudate lobe, high suspicion for malignancy such as hepatocellular  carcinoma or cholangiocarcinoma. Suspected effacement or occlusion of the right hepatic vein and posterior branches of the right portal vein. Two smaller tumor nodules along the posterior periphery of the dominant mass. Tissue diagnosis is recommended. 2. No findings of pathologic adenopathy or distant metastatic spread. 3. 9 mm gallstone in the gallbladder. There is mild gallbladder wall thickening which may be from nondistention, correlate clinically in assessing for cholecystitis. 4.  Aortic Atherosclerosis (ICD10-I70.0). 5. Mild diffuse hepatic steatosis.   08/11/2019 Initial Biopsy   DIAGNOSIS: 08/11/19  A. LIVER, RIGHT, BIOPSY:  - Adenocarcinoma.   08/18/2019 Imaging   CT Chest 08/18/19  IMPRESSION: 1. Multiple pulmonary nodules largest at approximately 7 mm in the right lower lobe, nonspecific but concerning given findings in the liver. 2. No signs of definitive metastatic disease, also with mildly enlarged upper abdominal lymph nodes as discussed.   Aortic Atherosclerosis (ICD10-I70.0).   08/19/2019 Initial Diagnosis   Intrahepatic cholangiocarcinoma ([place])    08/19/2019 Cancer Staging   Staging form: Intrahepatic Bile Duct, AJCC 8th Edition - Clinical stage from 08/19/2019: Stage II (cT2, cN0, cM0) - Signed by [name], MD on 08/19/2019    09/29/2019 - 07/19/2020 Chemotherapy   Cisplatin and Gemcitabine 2 weeks on/1 week off starting 09/29/19. Cisplatin held from cycle 9 (03/23/20) due to fluid status/Afib. He is now on maintenance Gemcitabine. Held after 07/19/20 to proceed with liver target therapy.     10/09/2019 Imaging   CT AP IMPRESSION: 1. The dominant right hepatic lobe mass is minimally reduced in size compared to prior exams, currently measuring 6.2 by 5.3 cm, previously 6.2 by 5.6 cm. However, there is a new small hypodense lesion centrally in the right hepatic lobe which is suspicious for a new small focus of tumor. Accordingly this is an  overall mixed  appearance. 2. Continued hypoenhancement in the liver downstream of the tumor likely attributable to narrowing or occlusion of the right hepatic vein by the tumor. By virtue of its location the tumor wraps around the intrahepatic portion of the IVC. 3. 4 mm right middle lobe pulmonary nodule, stable compared to earliest available comparison of 07/18/2019. Surveillance of the patient's pulmonary nodules is recommended. 4. Other imaging findings of potential clinical significance: Coronary atherosclerosis. Cholelithiasis. Prominent stool throughout the colon favors constipation. Moderate prostatomegaly with heterogeneous enhancement of the prostate gland. Lumbar spondylosis and degenerative disc disease causing mild bilateral foraminal impingement at L3-4 and L4-5.   Aortic Atherosclerosis (ICD10-I70.0).   12/24/2019 Imaging   CT CAP W Contrast  IMPRESSION: 1. Right hepatic lobe mass and adjacent right hepatic lobe nodules appear grossly stable. No evidence of distant metastatic disease. 2. Continued stability of small pulmonary nodules. Recommend attention on follow-up. 3. Cholelithiasis. 4. Enlarged prostate. 5. Aortic atherosclerosis (ICD10-I70.0). Coronary artery calcification.   02/23/2020 Procedure   He had PAC placed on 02/23/20.    04/08/2020 Imaging   CT CAP  IMPRESSION: 1. Stable or minimally decreased size of a very ill-defined, hypodense and somewhat retractile appearing mass of the central right lobe of the liver abutting the inferior vena cava and right portal vein, measuring approximately 5.7 x 5.0 cm, previously 6.2 x 5.2 cm when measured similarly. Findings are consistent with stable or minimally improved cholangiocarcinoma. 2. Small hypodense nodules of the right lobe identified on prior examination are poorly appreciated on this single phase contrast examination although not grossly changed. Attention on follow-up. 3. There are new, moderate bilateral pleural effusions  and associated atelectasis or consolidation as well as a new small pericardial effusion, nonspecific although generally concerning and suspicious for malignant effusions. There is no directly visualized pleural nodularity. 4. Multiple small pulmonary nodules are stable.  No new nodules. 5. Coronary artery disease. Aortic Atherosclerosis (ICD10-I70.0).   07/06/2020 Imaging   MRI ABDIMPRESSION: 1. Interval decrease in size of the right hepatic lobe lesion in the medial aspect of the segment 7. Findings would suggest a good response to treatment with some contraction of the tumor. 2. No new hepatic lesions. No abdominal adenopathy or metastatic disease. 3. Stable mild intrahepatic biliary dilatation in the right hepatic lobe distal to the lesion. 4. Somewhat tortuous and almost beaded appearance of the hepatic and portal vein radicles. Findings could be radiation related. 5. Cholelithiasis.  CT chest wo contrast IMPRESSION: 1. Persistent/stable moderate-sized bilateral pleural effusions with overlying atelectasis. 2. Stable small right pulmonary nodules. No new or progressive findings. Recommend continued surveillance. 3. No mediastinal or hilar mass or adenopathy. 4. Stable advanced three-vessel coronary artery calcifications. 5. Cholelithiasis. Aortic Atherosclerosis (ICD10-I70.0)   08/11/2020 Procedure   Y90 on 08/11/20 and 08/26/20 with Dr Laurence Ferrari    11/30/2020 Imaging   MRI Abdomen  IMPRESSION: 1. Today's study demonstrates progression of disease with enlarging central lesion in the right lobe of the liver involving portions of segments 7 and 8, now with evidence of some tumor thrombus extension into the intrahepatic portion of the inferior vena cava. This is associated with increasing intrahepatic biliary ductal dilatation in segment 7 of the liver. 2. In addition, there is some subtle hyperenhancement of the distal common bile duct at and immediately proximally to the  level of the ampulla. There is also some subtle delayed enhancement around this region in the pancreatic head. This is of uncertain etiology and significance, but may be  inflammatory and currently is not associated with proximal common bile duct dilatation. However, close attention on follow-up imaging is recommended. 3. Cholelithiasis without evidence of acute cholecystitis at this time. 4. Aortic atherosclerosis.     12/24/2020 - 03/21/2021 Chemotherapy   Restart Gemcitabine 2 weeks on/1 week off given disease progression beginning 12/24/20. Discontinued after 03/21/21 due to disease progression in liver.     03/16/2021 Imaging   MRI abdomen  IMPRESSION: No significant change in size of irregular hypovascular mass in the medial right hepatic lobe.   New 1.1 cm hypovascular lesion with peripheral rim enhancement in liver dome, suspicious for metastatic disease.   New small right pleural effusion and mild ascites.   Cholelithiasis, without evidence of cholecystitis or biliary dilatation.   04/05/2021 -  Chemotherapy   Second-line FOLFOX q2weeks starting 04/05/21    04/05/2021 -  Chemotherapy   Immunotherapy with Durvalumab (Imfinzi) q4weeks starting 04/05/21. (in addition to second line FOLFOX)      CURRENT THERAPY:  Second-line FOLFOX q2weeks starting 04/05/21 Additional Durvalumab (Imfinzi) q4weeks starting 04/05/21.   INTERVAL HISTORY: Brandon Sherman is here for a follow up. He was last seen by me 05/02/21. He presents to the clinic with his wife. He is here today for cycle 4 colorectal Folfox. The patient notes he is still working in the yard and in his garden. He has not been feeling any worse or sick after the continued chemo. His fatigue is not bothersome and he continues to eat well. His weight has been stable with no pain or discomfort. The patient notes he has not been on the Magnesium due to running out of this, but continues to take his potassium.    REVIEW OF SYSTEMS:   Constitutional: Denies fevers, chills or abnormal weight loss Eyes: Denies blurriness of vision Ears, nose, mouth, throat, and face: Denies mucositis or sore throat Respiratory: Denies cough, dyspnea or wheezes Cardiovascular: Denies palpitation, chest discomfort or lower extremity swelling Gastrointestinal:  Denies nausea, heartburn or change in bowel habits Skin: Denies abnormal skin rashes Lymphatics: Denies new lymphadenopathy or easy bruising Neurological:Denies numbness, tingling or new weaknesses Behavioral/Psych: Mood is stable, no new changes  All other systems were reviewed with the patient and are negative.  MEDICAL HISTORY:  Past Medical History:  Diagnosis Date   Arthritis    Diabetes ([place])    GERD (gastroesophageal reflux disease)    Hyperlipidemia    Hypertension    Intrahepatic cholangiocarcinoma ([place])     SURGICAL HISTORY: Past Surgical History:  Procedure Laterality Date   APPENDECTOMY  1980   CARDIOVERSION N/A 04/27/2020   Procedure: CARDIOVERSION;  Surgeon: Dorothy Spark, MD;  Location: [facility];  Service: Cardiovascular;  Laterality: N/A;   CARDIOVERSION N/A 05/19/2020   Procedure: CARDIOVERSION;  Surgeon: Elouise Munroe, MD;  Location: [facility];  Service: Cardiovascular;  Laterality: N/A;   COLONOSCOPY     IR 3D INDEPENDENT WKST  08/11/2020   IR 3D INDEPENDENT WKST  08/11/2020   IR ANGIOGRAM SELECTIVE EACH ADDITIONAL VESSEL  08/11/2020   IR ANGIOGRAM SELECTIVE EACH ADDITIONAL VESSEL  08/11/2020   IR ANGIOGRAM SELECTIVE EACH ADDITIONAL VESSEL  08/11/2020   IR ANGIOGRAM SELECTIVE EACH ADDITIONAL VESSEL  08/11/2020   IR ANGIOGRAM SELECTIVE EACH ADDITIONAL VESSEL  08/11/2020   IR ANGIOGRAM SELECTIVE EACH ADDITIONAL VESSEL  08/11/2020   IR ANGIOGRAM SELECTIVE EACH ADDITIONAL VESSEL  08/26/2020   IR ANGIOGRAM SELECTIVE EACH ADDITIONAL VESSEL  08/26/2020   IR ANGIOGRAM VISCERAL SELECTIVE  08/11/2020  IR ANGIOGRAM VISCERAL SELECTIVE  08/26/2020   IR EMBO  ARTERIAL NOT HEMORR HEMANG INC GUIDE ROADMAPPING  08/11/2020   IR EMBO TUMOR ORGAN ISCHEMIA INFARCT INC GUIDE ROADMAPPING  08/26/2020   IR IMAGING GUIDED PORT INSERTION  02/23/2020   IR RADIOLOGIST EVAL & MGMT  07/14/2020   IR RADIOLOGIST EVAL & MGMT  09/30/2020   IR RADIOLOGIST EVAL & MGMT  12/01/2020   IR US GUIDE VASC ACCESS LEFT  08/26/2020   IR US GUIDE VASC ACCESS RIGHT  08/11/2020    I have reviewed the social history and family history with the patient and they are unchanged from previous note.  ALLERGIES:  has No Known Allergies.  MEDICATIONS:  Current Outpatient Medications  Medication Sig Dispense Refill   allopurinol (ZYLOPRIM) 100 MG tablet Take 100 mg by mouth daily.     amLODipine (NORVASC) 5 MG tablet      colchicine 0.6 MG tablet Take 1 tablet (0.6 mg total) by mouth daily as needed (gout flare). Take daily for 3 days, then as needed for gout flare 30 tablet 0   diltiazem (CARDIZEM CD) 240 MG 24 hr capsule Take 1 capsule (240 mg total) by mouth daily. 90 capsule 3   ELIQUIS 5 MG TABS tablet TAKE 1 TABLET TWICE DAILY 180 tablet 1   fenofibrate (TRICOR) 145 MG tablet Take 145 mg by mouth daily.     finasteride (PROSCAR) 5 MG tablet Take 5 mg by mouth daily.     furosemide (LASIX) 40 MG tablet Take 40 mg twice a day 90 tablet 3   gabapentin (NEURONTIN) 100 MG capsule Take 100 mg by mouth at bedtime.     hydrALAZINE (APRESOLINE) 50 MG tablet TAKE 2 TABLETS BY MOUTH IN THE MORNING, 1 TABLET AT LUNCH AND 2 IN THE EVENING 450 tablet 1   hydrALAZINE (APRESOLINE) 50 MG tablet Take 50 mg by mouth 2 (two) times daily.     KLOR-CON M20 20 MEQ tablet TAKE 1 TABLET BY MOUTH TWICE A DAY 60 tablet 1   KLOR-CON M20 20 MEQ tablet TAKE 1 TABLET BY MOUTH TWICE A DAY 180 tablet 3   lidocaine-prilocaine (EMLA) cream Apply 1 application topically as needed. 30 g 3   metoprolol succinate (TOPROL XL) 25 MG 24 hr tablet Take 1 tablet (25 mg total) by mouth daily. 90 tablet 3   metoprolol tartrate  (LOPRESSOR) 50 MG tablet      pantoprazole (PROTONIX) 40 MG tablet TAKE 1 TABLET BY MOUTH TWICE A DAY 180 tablet 2   pravastatin (PRAVACHOL) 40 MG tablet Take 40 mg by mouth daily.     sucralfate (CARAFATE) 1 g tablet Take 1 tablet (1 g total) by mouth every 6 (six) hours as needed. Please schedule a yearly follow up for further refills: [phone] 60 tablet 0   tamsulosin (FLOMAX) 0.4 MG CAPS capsule Take 0.4 mg by mouth daily.     No current facility-administered medications for this visit.   Facility-Administered Medications Ordered in Other Visits  Medication Dose Route Frequency Provider Last Rate Last Admin   fluorouracil (ADRUCIL) 5,000 mg in sodium chloride 0.9 % 150 mL chemo infusion  5,000 mg Intravenous 1 day or 1 dose [name], MD   5,000 mg at 05/18/21 1350    PHYSICAL EXAMINATION: ECOG PERFORMANCE STATUS: 1 - Symptomatic but completely ambulatory  Vitals:   05/18/21 1038  BP: 128/68  Pulse: 61  Resp: 18  Temp: 97.6 F (36.4 C)  SpO2: 99%  Filed Weights   05/18/21 1038  Weight: 217 lb 4.8 oz (98.6 kg)    GENERAL:alert, no distress and comfortable SKIN: skin color, texture, turgor are normal, no rashes or significant lesions EYES: normal, Conjunctiva are pink and non-injected, sclera clear  LUNGS: clear to auscultation and percussion with normal breathing effort HEART: regular rate & rhythm and no murmurs and no lower extremity edema ABDOMEN:abdomen soft, non-tender and normal bowel sounds NEURO: alert & oriented x 3 with fluent speech, no focal motor/sensory deficits  LABORATORY DATA:  I have reviewed the data as listed CBC Latest Ref Rng & Units 05/18/2021 05/02/2021 04/20/2021  WBC 4.0 - 10.5 K/uL 3.9(L) 4.0 4.2  Hemoglobin 13.0 - 17.0 g/dL 11.5(L) 11.3(L) 10.8(L)  Hematocrit 39.0 - 52.0 % 34.6(L) 34.7(L) 32.4(L)  Platelets 150 - 400 K/uL 138(L) 146(L) 165     CMP Latest Ref Rng & Units 05/18/2021 05/02/2021 04/20/2021  Glucose 70 - 99 mg/dL 117(H) 137(H)  155(H)  BUN 8 - 23 mg/dL 17 24(H) 21  Creatinine 0.61 - 1.24 mg/dL 1.55(H) 1.79(H) 1.61(H)  Sodium 135 - 145 mmol/L 140 135 138  Potassium 3.5 - 5.1 mmol/L 3.7 4.2 3.9  Chloride 98 - 111 mmol/L 104 104 104  CO2 22 - 32 mmol/L 26 21(L) 23  Calcium 8.9 - 10.3 mg/dL 9.6 9.7 9.5  Total Protein 6.5 - 8.1 g/dL 7.1 7.4 7.4  Total Bilirubin 0.3 - 1.2 mg/dL 0.4 0.5 0.5  Alkaline Phos 38 - 126 U/L 55 57 57  AST 15 - 41 U/L 27 29 35  ALT 0 - 44 U/L _0 RADIOGRAPHIC STUDIES: I have personally reviewed the radiological images as listed and agreed with the findings in the report. No results found.   ASSESSMENT & PLAN:  Brandon Sherman is a 75 y.o. male with    1. Intrahepatic cholangiocarcinoma, cT2N0M0, stage II, unresectable, with indeterminate lung nodules  -He was diagnosed in 07/2019. CT scans and MRI liver show a large 7.3cm mass in the right hepatic lobe which abuts portal vein. -He was seen by our local surgeon Dr. Barry Dienes and Dr Carlis Abbott at [facility], both concluded that cancer is not resectable due to the invasion to portal vein. Given cancer is non-resectable his cancer is likely not curable but still treatable. -He was treated with standard first line chemo with IV Cisplatin and Gemcitabine 2 weeks on/1 week off beginning 09/29/19. Cisplatin was held since C9 (03/23/20) due to fluid status/AFib. He continued maintenance Gemcitabine until 07/19/20. He proceeded with Y90 on 08/11/20 and 08/26/20. -His MRI Abdomen from 12/11/20 showed progression of disease in liver with tumor thrombus extension into IVC. Given disease progression, Dr Geroge Baseman does not recommend more Y90 or liver target therapy at this point.  -To control his disease I restarted Single agent Gemcitabine 2 weeks on/1 week off beginning 12/24/20.  -He has IDH-1 mutation, which makes him eligible for an oral IDH inhibitor drug Ivosidenib.  -Based on 03/16/21 MRI he progressed with New 1.1 cm hypovascular lesion suspicious for  metastatic disease. -He is not a candidate for cisplatin due to his CKD and CHF. I changed him to second-line chemo with FOLFOX q2weeks and added immunotherapy Durvalumab (Imfinzi) q3-4weeks starting 04/05/21. Goal of chemo is to control his disease and prolong his life.  -S/p C3 he is tolerating current regimen well overall. Labs reviewed, CBC showed stable mild anemia, CMP still pending. Overall adequate to proceed with Imfinzi and C4 FOLFOX today.  -  Continue with same dosage. No prohibitive toxicities. -Continue FOLFOX q2weeks and Imfinzi q4weeks. -F/u in 2 weeks. Will order scan at next visit.      2. DM, HTN, HLD, Gout, LE edema -On Metformin, amlodipine, lisinopril., allopurinol, Lasix. Continue to f/u with his PCP and cardiologist.  -His PCP has started him on Janumet for his feet burning.  -Edema is currently very minimal and not a concern. Much improved.   3. AF and CHF -With PAC placement on 02/23/20 he was found to have Afib. Based on CHADS2 Score 2. He is at moderate risk for stroke.  -He underwent Cardioversion with Dr. Meda Coffee on 04/27/20. After CHF in late 04/2020 he had another cardioversion on 05/19/20. He has recovered well.  -His Afib is intermittent. He will continue his medications and f/u with cardiologist. He is on Eliquis  -Patient will see Cardiologist in July. -Encouraged pt to continue to increase his water intake and stay well hydrated.   4. Nicotine Use -He never smoked but has been chewing Tobacco for the past 60 years. He no longer drinks alcohol.  -He currently chews 1-2 times a day. I have repeatedly discussed and encouraged tobacco cessation.    5. GERD and gastritis, history of esophageal candidiasis, DM  -He had repeated EGD with Dr. Havery Moros in 07/2019. 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 -Will monitor and may repeat EGD in future  -He is not currently on DM  medication. I encouraged him to watch his diet and reduce sugar.      PLAN:  -Reviewed labs with patient. CBC is stable, other labs pending. Will proceed with treatmenttoday at same dose  -Lab, flush, and FOLFOX in 2, 4, 6, and 8 weeks.  -Imfinzi in 4 and 8 weeks. -Will get repeat scan after cycle 6, will order on next visit    No problem-specific Assessment & Plan notes found for this encounter.   No orders of the defined types were placed in this encounter.  All questions were answered. The patient knows to call the clinic with any problems, questions or concerns. No barriers to learning was detected. The total time spent in the appointment was 30 minutes.     [name], MD 05/18/2021   I, Reinaldo Raddle, am acting as scribe for Dr. [name], MD.

## 2021-05-18 ENCOUNTER — Encounter: Payer: Self-pay | Admitting: Hematology

## 2021-05-18 ENCOUNTER — Other Ambulatory Visit: Payer: Self-pay

## 2021-05-18 ENCOUNTER — Inpatient Hospital Stay: Payer: Medicare HMO

## 2021-05-18 ENCOUNTER — Inpatient Hospital Stay: Payer: Medicare HMO | Admitting: Hematology

## 2021-05-18 VITALS — BP 128/68 | HR 61 | Temp 97.6°F | Resp 18 | Ht 69.0 in | Wt 217.3 lb

## 2021-05-18 DIAGNOSIS — C221 Intrahepatic bile duct carcinoma: Secondary | ICD-10-CM

## 2021-05-18 DIAGNOSIS — Z5111 Encounter for antineoplastic chemotherapy: Secondary | ICD-10-CM | POA: Diagnosis not present

## 2021-05-18 DIAGNOSIS — Z95828 Presence of other vascular implants and grafts: Secondary | ICD-10-CM

## 2021-05-18 DIAGNOSIS — Z452 Encounter for adjustment and management of vascular access device: Secondary | ICD-10-CM | POA: Diagnosis not present

## 2021-05-18 DIAGNOSIS — Z7189 Other specified counseling: Secondary | ICD-10-CM

## 2021-05-18 DIAGNOSIS — Z79899 Other long term (current) drug therapy: Secondary | ICD-10-CM | POA: Diagnosis not present

## 2021-05-18 DIAGNOSIS — Z5112 Encounter for antineoplastic immunotherapy: Secondary | ICD-10-CM | POA: Diagnosis not present

## 2021-05-18 LAB — CMP (CANCER CENTER ONLY)
ALT: 20 U/L (ref 0–44)
AST: 27 U/L (ref 15–41)
Albumin: 3.4 g/dL — ABNORMAL LOW (ref 3.5–5.0)
Alkaline Phosphatase: 55 U/L (ref 38–126)
Anion gap: 10 (ref 5–15)
BUN: 17 mg/dL (ref 8–23)
CO2: 26 mmol/L (ref 22–32)
Calcium: 9.6 mg/dL (ref 8.9–10.3)
Chloride: 104 mmol/L (ref 98–111)
Creatinine: 1.55 mg/dL — ABNORMAL HIGH (ref 0.61–1.24)
GFR, Estimated: 47 mL/min — ABNORMAL LOW (ref >60)
Glucose, Bld: 117 mg/dL — ABNORMAL HIGH (ref 70–99)
Potassium: 3.7 mmol/L (ref 3.5–5.1)
Sodium: 140 mmol/L (ref 135–145)
Total Bilirubin: 0.4 mg/dL (ref 0.3–1.2)
Total Protein: 7.1 g/dL (ref 6.5–8.1)

## 2021-05-18 LAB — CBC WITH DIFFERENTIAL (CANCER CENTER ONLY)
Abs Immature Granulocytes: 0.01 10*3/uL (ref 0.00–0.07)
Basophils Absolute: 0 10*3/uL (ref 0.0–0.1)
Basophils Relative: 1 %
Eosinophils Absolute: 0.1 10*3/uL (ref 0.0–0.5)
Eosinophils Relative: 2 %
HCT: 34.6 % — ABNORMAL LOW (ref 39.0–52.0)
Hemoglobin: 11.5 g/dL — ABNORMAL LOW (ref 13.0–17.0)
Immature Granulocytes: 0 %
Lymphocytes Relative: 17 %
Lymphs Abs: 0.7 10*3/uL (ref 0.7–4.0)
MCH: 32.8 pg (ref 26.0–34.0)
MCHC: 33.2 g/dL (ref 30.0–36.0)
MCV: 98.6 fL (ref 80.0–100.0)
Monocytes Absolute: 0.7 10*3/uL (ref 0.1–1.0)
Monocytes Relative: 19 %
Neutro Abs: 2.4 10*3/uL (ref 1.7–7.7)
Neutrophils Relative %: 61 %
Platelet Count: 138 10*3/uL — ABNORMAL LOW (ref 150–400)
RBC: 3.51 MIL/uL — ABNORMAL LOW (ref 4.22–5.81)
RDW: 16.3 % — ABNORMAL HIGH (ref 11.5–15.5)
WBC Count: 3.9 10*3/uL — ABNORMAL LOW (ref 4.0–10.5)
nRBC: 0 % (ref 0.0–0.2)

## 2021-05-18 LAB — MAGNESIUM: Magnesium: 2 mg/dL (ref 1.7–2.4)

## 2021-05-18 MED ORDER — DEXTROSE 5 % IV SOLN
Freq: Once | INTRAVENOUS | Status: AC
  Filled 2021-05-18: qty 250

## 2021-05-18 MED ORDER — SODIUM CHLORIDE 0.9 % IV SOLN
5000.0000 mg | INTRAVENOUS | Status: DC
  Administered 2021-05-18: 5000 mg via INTRAVENOUS
  Filled 2021-05-18: qty 100

## 2021-05-18 MED ORDER — SODIUM CHLORIDE 0.9% FLUSH
10.0000 mL | Freq: Once | INTRAVENOUS | Status: AC
  Administered 2021-05-18: 10 mL
  Filled 2021-05-18: qty 10

## 2021-05-18 MED ORDER — OXALIPLATIN CHEMO INJECTION 100 MG/20ML
75.0000 mg/m2 | Freq: Once | INTRAVENOUS | Status: AC
  Administered 2021-05-18: 165 mg via INTRAVENOUS
  Filled 2021-05-18: qty 33

## 2021-05-18 MED ORDER — PALONOSETRON HCL INJECTION 0.25 MG/5ML
0.2500 mg | Freq: Once | INTRAVENOUS | Status: AC
  Administered 2021-05-18: 0.25 mg via INTRAVENOUS

## 2021-05-18 MED ORDER — PALONOSETRON HCL INJECTION 0.25 MG/5ML
INTRAVENOUS | Status: AC
  Filled 2021-05-18: qty 5

## 2021-05-18 MED ORDER — LEUCOVORIN CALCIUM INJECTION 350 MG
400.0000 mg/m2 | Freq: Once | INTRAVENOUS | Status: AC
  Administered 2021-05-18: 880 mg via INTRAVENOUS
  Filled 2021-05-18: qty 44

## 2021-05-18 NOTE — Patient Instructions (Signed)
[facility] ONCOLOGY  Discharge Instructions: Thank you for choosing [facility] to provide your oncology and hematology care.   If you have a lab appointment with the [facility], please go directly to the [facility] and check in at the registration area.   Wear comfortable clothing and clothing appropriate for easy access to any Portacath or PICC line.   We strive to give you quality time with your provider. You may need to reschedule your appointment if you arrive late (15 or more minutes).  Arriving late affects you and other patients whose appointments are after yours.  Also, if you miss three or more appointments without notifying the office, you may be dismissed from the clinic at the provider's discretion.      For prescription refill requests, have your pharmacy contact our office and allow 72 hours for refills to be completed.    Today you received the following chemotherapy and/or immunotherapy agents : Leucovorin, Oxaliplatin, 5FU     To help prevent nausea and vomiting after your treatment, we encourage you to take your nausea medication as directed.  BELOW ARE SYMPTOMS THAT SHOULD BE REPORTED IMMEDIATELY: *FEVER GREATER THAN 100.4 F (38 C) OR HIGHER *CHILLS OR SWEATING *NAUSEA AND VOMITING THAT IS NOT CONTROLLED WITH YOUR NAUSEA MEDICATION *UNUSUAL SHORTNESS OF BREATH *UNUSUAL BRUISING OR BLEEDING *URINARY PROBLEMS (pain or burning when urinating, or frequent urination) *BOWEL PROBLEMS (unusual diarrhea, constipation, pain near the anus) TENDERNESS IN MOUTH AND THROAT WITH OR WITHOUT PRESENCE OF ULCERS (sore throat, sores in mouth, or a toothache) UNUSUAL RASH, SWELLING OR PAIN  UNUSUAL VAGINAL DISCHARGE OR ITCHING   Items with * indicate a potential emergency and should be followed up as soon as possible or go to the Emergency Department if any problems should occur.  Please show the CHEMOTHERAPY ALERT CARD or IMMUNOTHERAPY ALERT  CARD at check-in to the Emergency Department and triage nurse.  Should you have questions after your visit or need to cancel or reschedule your appointment, please contact [facility]  Dept: [phone]  and follow the prompts.  Office hours are 8:00 a.m. to 4:30 p.m. Monday - Friday. Please note that voicemails left after 4:00 p.m. may not be returned until the following business day.  We are closed weekends and major holidays. You have access to a nurse at all times for urgent questions. Please call the main number to the clinic Dept: [phone] and follow the prompts.   For any non-urgent questions, you may also contact your provider using MyChart. We now offer e-Visits for anyone 73 and older to request care online for non-urgent symptoms. For details visit [email or web address].   Also download the MyChart app! Go to the app store, search "MyChart", open the app, select [facility], and log in with your MyChart username and password.  Due to Covid, a mask is required upon entering the hospital/clinic. If you do not have a mask, one will be given to you upon arrival. For doctor visits, patients may have 1 support person aged 73 or older with them. For treatment visits, patients cannot have anyone with them due to current Covid guidelines and our immunocompromised population.

## 2021-05-18 NOTE — Progress Notes (Signed)
Okay to treat with creat 1.55 per Dr. Burr Medico

## 2021-05-20 ENCOUNTER — Inpatient Hospital Stay: Payer: Medicare HMO | Attending: Hematology

## 2021-05-20 ENCOUNTER — Other Ambulatory Visit: Payer: Self-pay

## 2021-05-20 VITALS — BP 131/74 | HR 66 | Temp 99.2°F | Resp 18

## 2021-05-20 DIAGNOSIS — Z5111 Encounter for antineoplastic chemotherapy: Secondary | ICD-10-CM | POA: Diagnosis not present

## 2021-05-20 DIAGNOSIS — Z452 Encounter for adjustment and management of vascular access device: Secondary | ICD-10-CM | POA: Diagnosis not present

## 2021-05-20 DIAGNOSIS — C221 Intrahepatic bile duct carcinoma: Secondary | ICD-10-CM | POA: Diagnosis not present

## 2021-05-20 DIAGNOSIS — Z95828 Presence of other vascular implants and grafts: Secondary | ICD-10-CM

## 2021-05-20 DIAGNOSIS — Z79899 Other long term (current) drug therapy: Secondary | ICD-10-CM | POA: Diagnosis not present

## 2021-05-20 MED ORDER — SODIUM CHLORIDE 0.9% FLUSH
10.0000 mL | Freq: Once | INTRAVENOUS | Status: AC
  Administered 2021-05-20: 10 mL
  Filled 2021-05-20: qty 10

## 2021-05-20 MED ORDER — HEPARIN SOD (PORK) LOCK FLUSH 100 UNIT/ML IV SOLN
500.0000 [IU] | Freq: Once | INTRAVENOUS | Status: AC
  Administered 2021-05-20: 500 [IU]
  Filled 2021-05-20: qty 5

## 2021-06-01 ENCOUNTER — Ambulatory Visit: Payer: Medicare HMO | Admitting: Hematology

## 2021-06-01 ENCOUNTER — Telehealth: Payer: Self-pay | Admitting: Internal Medicine

## 2021-06-01 ENCOUNTER — Inpatient Hospital Stay: Payer: Medicare HMO

## 2021-06-01 ENCOUNTER — Other Ambulatory Visit: Payer: Self-pay

## 2021-06-01 VITALS — BP 125/80 | HR 70 | Temp 98.0°F | Resp 18 | Wt 215.4 lb

## 2021-06-01 DIAGNOSIS — C221 Intrahepatic bile duct carcinoma: Secondary | ICD-10-CM

## 2021-06-01 DIAGNOSIS — Z95828 Presence of other vascular implants and grafts: Secondary | ICD-10-CM

## 2021-06-01 DIAGNOSIS — Z7189 Other specified counseling: Secondary | ICD-10-CM

## 2021-06-01 DIAGNOSIS — Z79899 Other long term (current) drug therapy: Secondary | ICD-10-CM | POA: Diagnosis not present

## 2021-06-01 DIAGNOSIS — Z5111 Encounter for antineoplastic chemotherapy: Secondary | ICD-10-CM | POA: Diagnosis not present

## 2021-06-01 DIAGNOSIS — Z452 Encounter for adjustment and management of vascular access device: Secondary | ICD-10-CM | POA: Diagnosis not present

## 2021-06-01 LAB — CBC WITH DIFFERENTIAL (CANCER CENTER ONLY)
Abs Immature Granulocytes: 0 10*3/uL (ref 0.00–0.07)
Basophils Absolute: 0 10*3/uL (ref 0.0–0.1)
Basophils Relative: 1 %
Eosinophils Absolute: 0 10*3/uL (ref 0.0–0.5)
Eosinophils Relative: 1 %
HCT: 34.7 % — ABNORMAL LOW (ref 39.0–52.0)
Hemoglobin: 11.3 g/dL — ABNORMAL LOW (ref 13.0–17.0)
Immature Granulocytes: 0 %
Lymphocytes Relative: 19 %
Lymphs Abs: 0.7 10*3/uL (ref 0.7–4.0)
MCH: 32.4 pg (ref 26.0–34.0)
MCHC: 32.6 g/dL (ref 30.0–36.0)
MCV: 99.4 fL (ref 80.0–100.0)
Monocytes Absolute: 0.8 10*3/uL (ref 0.1–1.0)
Monocytes Relative: 19 %
Neutro Abs: 2.4 10*3/uL (ref 1.7–7.7)
Neutrophils Relative %: 60 %
Platelet Count: 129 10*3/uL — ABNORMAL LOW (ref 150–400)
RBC: 3.49 MIL/uL — ABNORMAL LOW (ref 4.22–5.81)
RDW: 17.3 % — ABNORMAL HIGH (ref 11.5–15.5)
WBC Count: 4 10*3/uL (ref 4.0–10.5)
nRBC: 0 % (ref 0.0–0.2)

## 2021-06-01 LAB — CMP (CANCER CENTER ONLY)
ALT: 21 U/L (ref 0–44)
AST: 31 U/L (ref 15–41)
Albumin: 3.5 g/dL (ref 3.5–5.0)
Alkaline Phosphatase: 61 U/L (ref 38–126)
Anion gap: 9 (ref 5–15)
BUN: 21 mg/dL (ref 8–23)
CO2: 26 mmol/L (ref 22–32)
Calcium: 9.4 mg/dL (ref 8.9–10.3)
Chloride: 104 mmol/L (ref 98–111)
Creatinine: 1.92 mg/dL — ABNORMAL HIGH (ref 0.61–1.24)
GFR, Estimated: 36 mL/min — ABNORMAL LOW (ref >60)
Glucose, Bld: 136 mg/dL — ABNORMAL HIGH (ref 70–99)
Potassium: 3.8 mmol/L (ref 3.5–5.1)
Sodium: 139 mmol/L (ref 135–145)
Total Bilirubin: 0.5 mg/dL (ref 0.3–1.2)
Total Protein: 7.2 g/dL (ref 6.5–8.1)

## 2021-06-01 LAB — TSH: TSH: 2.515 u[IU]/mL (ref 0.320–4.118)

## 2021-06-01 MED ORDER — SODIUM CHLORIDE 0.9 % IV SOLN
5000.0000 mg | INTRAVENOUS | Status: DC
  Administered 2021-06-01: 5000 mg via INTRAVENOUS
  Filled 2021-06-01: qty 100

## 2021-06-01 MED ORDER — PALONOSETRON HCL INJECTION 0.25 MG/5ML
INTRAVENOUS | Status: AC
  Filled 2021-06-01: qty 5

## 2021-06-01 MED ORDER — LEUCOVORIN CALCIUM INJECTION 350 MG
400.0000 mg/m2 | Freq: Once | INTRAVENOUS | Status: AC
  Administered 2021-06-01: 880 mg via INTRAVENOUS
  Filled 2021-06-01: qty 44

## 2021-06-01 MED ORDER — OXALIPLATIN CHEMO INJECTION 100 MG/20ML
75.0000 mg/m2 | Freq: Once | INTRAVENOUS | Status: AC
  Administered 2021-06-01: 165 mg via INTRAVENOUS
  Filled 2021-06-01: qty 33

## 2021-06-01 MED ORDER — PALONOSETRON HCL INJECTION 0.25 MG/5ML
0.2500 mg | Freq: Once | INTRAVENOUS | Status: AC
  Administered 2021-06-01: 0.25 mg via INTRAVENOUS

## 2021-06-01 MED ORDER — DEXTROSE 5 % IV SOLN
Freq: Once | INTRAVENOUS | Status: AC
  Filled 2021-06-01: qty 250

## 2021-06-01 MED ORDER — DEXTROSE 5 % IV SOLN
Freq: Once | INTRAVENOUS | Status: DC
  Filled 2021-06-01: qty 250

## 2021-06-01 MED ORDER — SODIUM CHLORIDE 0.9% FLUSH
10.0000 mL | Freq: Once | INTRAVENOUS | Status: AC
  Administered 2021-06-01: 10 mL
  Filled 2021-06-01: qty 10

## 2021-06-01 NOTE — Progress Notes (Signed)
Ok to proceed with folfox fer Dr.Iruku and Cira Rue, NP, will hold imfinzi today

## 2021-06-01 NOTE — Progress Notes (Signed)
SCr elevated.  1.8 x pts baseline. Per Dr. Chryl Heck, hold Imfinzi on 06/01/21.  Kennith Center, Pharm.D., CPP 06/01/2021@12 :57 PM

## 2021-06-01 NOTE — Patient Instructions (Signed)
[facility] ONCOLOGY  Discharge Instructions: Thank you for choosing [facility] to provide your oncology and hematology care.   If you have a lab appointment with the [facility], please go directly to the [facility] and check in at the registration area.   Wear comfortable clothing and clothing appropriate for easy access to any Portacath or PICC line.   We strive to give you quality time with your provider. You may need to reschedule your appointment if you arrive late (15 or more minutes).  Arriving late affects you and other patients whose appointments are after yours.  Also, if you miss three or more appointments without notifying the office, you may be dismissed from the clinic at the provider's discretion.      For prescription refill requests, have your pharmacy contact our office and allow 72 hours for refills to be completed.    Today you received the following chemotherapy and/or immunotherapy agents : Leucovorin, Oxaliplatin, 5FU     To help prevent nausea and vomiting after your treatment, we encourage you to take your nausea medication as directed.  BELOW ARE SYMPTOMS THAT SHOULD BE REPORTED IMMEDIATELY: *FEVER GREATER THAN 100.4 F (38 C) OR HIGHER *CHILLS OR SWEATING *NAUSEA AND VOMITING THAT IS NOT CONTROLLED WITH YOUR NAUSEA MEDICATION *UNUSUAL SHORTNESS OF BREATH *UNUSUAL BRUISING OR BLEEDING *URINARY PROBLEMS (pain or burning when urinating, or frequent urination) *BOWEL PROBLEMS (unusual diarrhea, constipation, pain near the anus) TENDERNESS IN MOUTH AND THROAT WITH OR WITHOUT PRESENCE OF ULCERS (sore throat, sores in mouth, or a toothache) UNUSUAL RASH, SWELLING OR PAIN  UNUSUAL VAGINAL DISCHARGE OR ITCHING   Items with * indicate a potential emergency and should be followed up as soon as possible or go to the Emergency Department if any problems should occur.  Please show the CHEMOTHERAPY ALERT CARD or IMMUNOTHERAPY ALERT  CARD at check-in to the Emergency Department and triage nurse.  Should you have questions after your visit or need to cancel or reschedule your appointment, please contact [facility]  Dept: [phone]  and follow the prompts.  Office hours are 8:00 a.m. to 4:30 p.m. Monday - Friday. Please note that voicemails left after 4:00 p.m. may not be returned until the following business day.  We are closed weekends and major holidays. You have access to a nurse at all times for urgent questions. Please call the main number to the clinic Dept: [phone] and follow the prompts.   For any non-urgent questions, you may also contact your provider using MyChart. We now offer e-Visits for anyone 11 and older to request care online for non-urgent symptoms. For details visit [email or web address].   Also download the MyChart app! Go to the app store, search "MyChart", open the app, select [facility], and log in with your MyChart username and password.  Due to Covid, a mask is required upon entering the hospital/clinic. If you do not have a mask, one will be given to you upon arrival. For doctor visits, patients may have 1 support person aged 4 or older with them. For treatment visits, patients cannot have anyone with them due to current Covid guidelines and our immunocompromised population.

## 2021-06-01 NOTE — Telephone Encounter (Signed)
Scheduled appointment per 07/13 sch msg. Patient is aware 

## 2021-06-02 LAB — T4: T4, Total: 12.2 ug/dL — ABNORMAL HIGH (ref 4.5–12.0)

## 2021-06-03 ENCOUNTER — Other Ambulatory Visit: Payer: Self-pay

## 2021-06-03 ENCOUNTER — Inpatient Hospital Stay: Payer: Medicare HMO

## 2021-06-03 VITALS — BP 148/74 | HR 80 | Temp 99.3°F | Resp 18

## 2021-06-03 DIAGNOSIS — C221 Intrahepatic bile duct carcinoma: Secondary | ICD-10-CM

## 2021-06-03 DIAGNOSIS — Z95828 Presence of other vascular implants and grafts: Secondary | ICD-10-CM

## 2021-06-03 DIAGNOSIS — Z5111 Encounter for antineoplastic chemotherapy: Secondary | ICD-10-CM | POA: Diagnosis not present

## 2021-06-03 DIAGNOSIS — Z452 Encounter for adjustment and management of vascular access device: Secondary | ICD-10-CM | POA: Diagnosis not present

## 2021-06-03 DIAGNOSIS — Z79899 Other long term (current) drug therapy: Secondary | ICD-10-CM | POA: Diagnosis not present

## 2021-06-03 MED ORDER — HEPARIN SOD (PORK) LOCK FLUSH 100 UNIT/ML IV SOLN
500.0000 [IU] | Freq: Once | INTRAVENOUS | Status: AC
  Administered 2021-06-03: 500 [IU]
  Filled 2021-06-03: qty 5

## 2021-06-03 MED ORDER — SODIUM CHLORIDE 0.9% FLUSH
10.0000 mL | Freq: Once | INTRAVENOUS | Status: AC
  Administered 2021-06-03: 10 mL
  Filled 2021-06-03: qty 10

## 2021-06-05 DIAGNOSIS — C221 Intrahepatic bile duct carcinoma: Secondary | ICD-10-CM | POA: Diagnosis not present

## 2021-06-08 ENCOUNTER — Other Ambulatory Visit: Payer: Self-pay

## 2021-06-08 ENCOUNTER — Inpatient Hospital Stay: Payer: Medicare HMO

## 2021-06-08 DIAGNOSIS — Z79899 Other long term (current) drug therapy: Secondary | ICD-10-CM | POA: Diagnosis not present

## 2021-06-08 DIAGNOSIS — Z452 Encounter for adjustment and management of vascular access device: Secondary | ICD-10-CM | POA: Diagnosis not present

## 2021-06-08 DIAGNOSIS — Z5111 Encounter for antineoplastic chemotherapy: Secondary | ICD-10-CM | POA: Diagnosis not present

## 2021-06-08 DIAGNOSIS — Z95828 Presence of other vascular implants and grafts: Secondary | ICD-10-CM

## 2021-06-08 DIAGNOSIS — C221 Intrahepatic bile duct carcinoma: Secondary | ICD-10-CM

## 2021-06-08 LAB — CBC WITH DIFFERENTIAL (CANCER CENTER ONLY)
Abs Immature Granulocytes: 0.04 10*3/uL (ref 0.00–0.07)
Basophils Absolute: 0 10*3/uL (ref 0.0–0.1)
Basophils Relative: 1 %
Eosinophils Absolute: 0 10*3/uL (ref 0.0–0.5)
Eosinophils Relative: 1 %
HCT: 33.7 % — ABNORMAL LOW (ref 39.0–52.0)
Hemoglobin: 11.3 g/dL — ABNORMAL LOW (ref 13.0–17.0)
Immature Granulocytes: 1 %
Lymphocytes Relative: 26 %
Lymphs Abs: 0.7 10*3/uL (ref 0.7–4.0)
MCH: 32.4 pg (ref 26.0–34.0)
MCHC: 33.5 g/dL (ref 30.0–36.0)
MCV: 96.6 fL (ref 80.0–100.0)
Monocytes Absolute: 0.4 10*3/uL (ref 0.1–1.0)
Monocytes Relative: 14 %
Neutro Abs: 1.6 10*3/uL — ABNORMAL LOW (ref 1.7–7.7)
Neutrophils Relative %: 57 %
Platelet Count: 111 10*3/uL — ABNORMAL LOW (ref 150–400)
RBC: 3.49 MIL/uL — ABNORMAL LOW (ref 4.22–5.81)
RDW: 16.8 % — ABNORMAL HIGH (ref 11.5–15.5)
WBC Count: 2.8 10*3/uL — ABNORMAL LOW (ref 4.0–10.5)
nRBC: 0 % (ref 0.0–0.2)

## 2021-06-08 LAB — CMP (CANCER CENTER ONLY)
ALT: 24 U/L (ref 0–44)
AST: 36 U/L (ref 15–41)
Albumin: 3.5 g/dL (ref 3.5–5.0)
Alkaline Phosphatase: 62 U/L (ref 38–126)
Anion gap: 10 (ref 5–15)
BUN: 18 mg/dL (ref 8–23)
CO2: 24 mmol/L (ref 22–32)
Calcium: 9.2 mg/dL (ref 8.9–10.3)
Chloride: 105 mmol/L (ref 98–111)
Creatinine: 1.69 mg/dL — ABNORMAL HIGH (ref 0.61–1.24)
GFR, Estimated: 42 mL/min — ABNORMAL LOW (ref >60)
Glucose, Bld: 106 mg/dL — ABNORMAL HIGH (ref 70–99)
Potassium: 3.8 mmol/L (ref 3.5–5.1)
Sodium: 139 mmol/L (ref 135–145)
Total Bilirubin: 0.6 mg/dL (ref 0.3–1.2)
Total Protein: 7.3 g/dL (ref 6.5–8.1)

## 2021-06-08 LAB — MAGNESIUM: Magnesium: 1.9 mg/dL (ref 1.7–2.4)

## 2021-06-08 MED ORDER — SODIUM CHLORIDE 0.9% FLUSH
10.0000 mL | Freq: Once | INTRAVENOUS | Status: AC
  Administered 2021-06-08: 10 mL
  Filled 2021-06-08: qty 10

## 2021-06-08 MED ORDER — HEPARIN SOD (PORK) LOCK FLUSH 100 UNIT/ML IV SOLN
500.0000 [IU] | Freq: Once | INTRAVENOUS | Status: AC
  Administered 2021-06-08: 500 [IU]
  Filled 2021-06-08: qty 5

## 2021-06-12 ENCOUNTER — Other Ambulatory Visit: Payer: Self-pay | Admitting: Hematology

## 2021-06-12 NOTE — Progress Notes (Signed)
[facility]   Telephone:([phone] Fax:([phone]   Clinic Follow up Note   Patient Care Team: [name], PA as PCP - General (Family Medicine) Lorretta Harp, MD as PCP - Cardiology (Cardiology) [name], MD as Consulting Physician (General Surgery) [name], [name], MD as Consulting Physician (Gastroenterology) [name], MD as Consulting Physician (Hematology) Lorretta Harp, MD as Consulting Physician (Cardiology) 06/13/2021  CHIEF COMPLAINT: Follow up cholangiocarcinoma   SUMMARY OF ONCOLOGIC HISTORY: Oncology History Overview Note  Cancer Staging Intrahepatic cholangiocarcinoma [facility]) Staging form: Intrahepatic Bile Duct, AJCC 8th Edition - Clinical stage from 08/19/2019: Stage II (cT2, cN0, cM0) - Signed by [name], MD on 08/19/2019     Intrahepatic cholangiocarcinoma ([place])  06/28/2019 Imaging   CT AP W Contrast 06/28/19  IMPRESSION: 1. Heterogeneous hypodensity posteriorly in the right hepatic lobe and potentially extending into the caudate lobe suspicious for a mass. There is felt to be truncation of branches of the portal vein in this vicinity and some narrowing of the hepatic vein, as well as triangular-shaped regions of abnormal hypoenhancement posteriorly in the right hepatic lobe likely representing downstream vascular effects. Cannot exclude malignancy such as cholangiocarcinoma or hepatocellular carcinoma, and follow up hepatic protocol MRI with and without contrast is recommended to further characterize. 2. 4 mm right middle lobe pulmonary nodule is likely benign but may merit surveillance. 3. Cholelithiasis. 4.  Aortic Atherosclerosis (ICD10-I70.0). 5. Prostatomegaly. 6. Mild impingement at L3-4 and L4-5.   07/31/2019 Imaging   MRI Liver 07/31/19 IMPRESSION: 1. 7.3 cm in long axis mass in the right hepatic lobe spanning into the caudate lobe, high suspicion for malignancy such as hepatocellular carcinoma or  cholangiocarcinoma. Suspected effacement or occlusion of the right hepatic vein and posterior branches of the right portal vein. Two smaller tumor nodules along the posterior periphery of the dominant mass. Tissue diagnosis is recommended. 2. No findings of pathologic adenopathy or distant metastatic spread. 3. 9 mm gallstone in the gallbladder. There is mild gallbladder wall thickening which may be from nondistention, correlate clinically in assessing for cholecystitis. 4.  Aortic Atherosclerosis (ICD10-I70.0). 5. Mild diffuse hepatic steatosis.   08/11/2019 Initial Biopsy   DIAGNOSIS: 08/11/19  A. LIVER, RIGHT, BIOPSY:  - Adenocarcinoma.   08/18/2019 Imaging   CT Chest 08/18/19  IMPRESSION: 1. Multiple pulmonary nodules largest at approximately 7 mm in the right lower lobe, nonspecific but concerning given findings in the liver. 2. No signs of definitive metastatic disease, also with mildly enlarged upper abdominal lymph nodes as discussed.   Aortic Atherosclerosis (ICD10-I70.0).   08/19/2019 Initial Diagnosis   Intrahepatic cholangiocarcinoma ([place])    08/19/2019 Cancer Staging   Staging form: Intrahepatic Bile Duct, AJCC 8th Edition - Clinical stage from 08/19/2019: Stage II (cT2, cN0, cM0) - Signed by [name], MD on 08/19/2019    09/29/2019 - 07/19/2020 Chemotherapy   Cisplatin and Gemcitabine 2 weeks on/1 week off starting 09/29/19. Cisplatin held from cycle 9 (03/23/20) due to fluid status/Afib. He is now on maintenance Gemcitabine. Held after 07/19/20 to proceed with liver target therapy.     10/09/2019 Imaging   CT AP IMPRESSION: 1. The dominant right hepatic lobe mass is minimally reduced in size compared to prior exams, currently measuring 6.2 by 5.3 cm, previously 6.2 by 5.6 cm. However, there is a new small hypodense lesion centrally in the right hepatic lobe which is suspicious for a new small focus of tumor. Accordingly this is an overall mixed appearance. 2.  Continued  hypoenhancement in the liver downstream of the tumor likely attributable to narrowing or occlusion of the right hepatic vein by the tumor. By virtue of its location the tumor wraps around the intrahepatic portion of the IVC. 3. 4 mm right middle lobe pulmonary nodule, stable compared to earliest available comparison of 07/18/2019. Surveillance of the patient's pulmonary nodules is recommended. 4. Other imaging findings of potential clinical significance: Coronary atherosclerosis. Cholelithiasis. Prominent stool throughout the colon favors constipation. Moderate prostatomegaly with heterogeneous enhancement of the prostate gland. Lumbar spondylosis and degenerative disc disease causing mild bilateral foraminal impingement at L3-4 and L4-5.   Aortic Atherosclerosis (ICD10-I70.0).   12/24/2019 Imaging   CT CAP W Contrast  IMPRESSION: 1. Right hepatic lobe mass and adjacent right hepatic lobe nodules appear grossly stable. No evidence of distant metastatic disease. 2. Continued stability of small pulmonary nodules. Recommend attention on follow-up. 3. Cholelithiasis. 4. Enlarged prostate. 5. Aortic atherosclerosis (ICD10-I70.0). Coronary artery calcification.   02/23/2020 Procedure   He had PAC placed on 02/23/20.    04/08/2020 Imaging   CT CAP  IMPRESSION: 1. Stable or minimally decreased size of a very ill-defined, hypodense and somewhat retractile appearing mass of the central right lobe of the liver abutting the inferior vena cava and right portal vein, measuring approximately 5.7 x 5.0 cm, previously 6.2 x 5.2 cm when measured similarly. Findings are consistent with stable or minimally improved cholangiocarcinoma. 2. Small hypodense nodules of the right lobe identified on prior examination are poorly appreciated on this single phase contrast examination although not grossly changed. Attention on follow-up. 3. There are new, moderate bilateral pleural effusions and associated atelectasis  or consolidation as well as a new small pericardial effusion, nonspecific although generally concerning and suspicious for malignant effusions. There is no directly visualized pleural nodularity. 4. Multiple small pulmonary nodules are stable.  No new nodules. 5. Coronary artery disease. Aortic Atherosclerosis (ICD10-I70.0).   07/06/2020 Imaging   MRI ABDIMPRESSION: 1. Interval decrease in size of the right hepatic lobe lesion in the medial aspect of the segment 7. Findings would suggest a good response to treatment with some contraction of the tumor. 2. No new hepatic lesions. No abdominal adenopathy or metastatic disease. 3. Stable mild intrahepatic biliary dilatation in the right hepatic lobe distal to the lesion. 4. Somewhat tortuous and almost beaded appearance of the hepatic and portal vein radicles. Findings could be radiation related. 5. Cholelithiasis.  CT chest wo contrast IMPRESSION: 1. Persistent/stable moderate-sized bilateral pleural effusions with overlying atelectasis. 2. Stable small right pulmonary nodules. No new or progressive findings. Recommend continued surveillance. 3. No mediastinal or hilar mass or adenopathy. 4. Stable advanced three-vessel coronary artery calcifications. 5. Cholelithiasis. Aortic Atherosclerosis (ICD10-I70.0)   08/11/2020 Procedure   Y90 on 08/11/20 and 08/26/20 with Dr Laurence Ferrari    11/30/2020 Imaging   MRI Abdomen  IMPRESSION: 1. Today's study demonstrates progression of disease with enlarging central lesion in the right lobe of the liver involving portions of segments 7 and 8, now with evidence of some tumor thrombus extension into the intrahepatic portion of the inferior vena cava. This is associated with increasing intrahepatic biliary ductal dilatation in segment 7 of the liver. 2. In addition, there is some subtle hyperenhancement of the distal common bile duct at and immediately proximally to the level of the ampulla. There is  also some subtle delayed enhancement around this region in the pancreatic head. This is of uncertain etiology and significance, but may be inflammatory and currently is  not associated with proximal common bile duct dilatation. However, close attention on follow-up imaging is recommended. 3. Cholelithiasis without evidence of acute cholecystitis at this time. 4. Aortic atherosclerosis.     12/24/2020 - 03/21/2021 Chemotherapy   Restart Gemcitabine 2 weeks on/1 week off given disease progression beginning 12/24/20. Discontinued after 03/21/21 due to disease progression in liver.     03/16/2021 Imaging   MRI abdomen  IMPRESSION: No significant change in size of irregular hypovascular mass in the medial right hepatic lobe.   New 1.1 cm hypovascular lesion with peripheral rim enhancement in liver dome, suspicious for metastatic disease.   New small right pleural effusion and mild ascites.   Cholelithiasis, without evidence of cholecystitis or biliary dilatation.   04/05/2021 -  Chemotherapy   Second-line FOLFOX q2weeks starting 04/05/21    04/05/2021 -  Chemotherapy   Immunotherapy with Durvalumab (Imfinzi) q4weeks starting 04/05/21. (in addition to second line FOLFOX)     CURRENT THERAPY:  1.Second-line FOLFOX q2weeks starting 04/05/21 2.Additional Durvalumab (Imfinzi) q4weeks starting 04/05/21.   INTERVAL HISTORY: Mr. Mullett returns for follow up and treatment as scheduled. He was last seen 05/18/21. Completed cycle 5 FOLFOX on 06/01/21, Imfinzi held due to elevated serum creatinine (1.92). Repeat CMP 06/08/21 showed improvement to 1.69.  He is doing well, energy fluctuates.  Appetite is normal, denies mucositis.  Cold sensitivity last 3 days, denies residual neuropathy in the absence of cold exposure.  Denies new pain, nausea/vomiting, constipation, diarrhea, rash, fever, chills, cough, chest pain, dyspnea, or other new concerns.   MEDICAL HISTORY:  Past Medical History:  Diagnosis Date    Arthritis    Diabetes ([place])    GERD (gastroesophageal reflux disease)    Hyperlipidemia    Hypertension    Intrahepatic cholangiocarcinoma ([place])     SURGICAL HISTORY: Past Surgical History:  Procedure Laterality Date   APPENDECTOMY  1980   CARDIOVERSION N/A 04/27/2020   Procedure: CARDIOVERSION;  Surgeon: Dorothy Spark, MD;  Location: [facility];  Service: Cardiovascular;  Laterality: N/A;   CARDIOVERSION N/A 05/19/2020   Procedure: CARDIOVERSION;  Surgeon: Elouise Munroe, MD;  Location: [facility];  Service: Cardiovascular;  Laterality: N/A;   COLONOSCOPY     IR 3D INDEPENDENT WKST  08/11/2020   IR 3D INDEPENDENT WKST  08/11/2020   IR ANGIOGRAM SELECTIVE EACH ADDITIONAL VESSEL  08/11/2020   IR ANGIOGRAM SELECTIVE EACH ADDITIONAL VESSEL  08/11/2020   IR ANGIOGRAM SELECTIVE EACH ADDITIONAL VESSEL  08/11/2020   IR ANGIOGRAM SELECTIVE EACH ADDITIONAL VESSEL  08/11/2020   IR ANGIOGRAM SELECTIVE EACH ADDITIONAL VESSEL  08/11/2020   IR ANGIOGRAM SELECTIVE EACH ADDITIONAL VESSEL  08/11/2020   IR ANGIOGRAM SELECTIVE EACH ADDITIONAL VESSEL  08/26/2020   IR ANGIOGRAM SELECTIVE EACH ADDITIONAL VESSEL  08/26/2020   IR ANGIOGRAM VISCERAL SELECTIVE  08/11/2020   IR ANGIOGRAM VISCERAL SELECTIVE  08/26/2020   IR EMBO ARTERIAL NOT HEMORR HEMANG INC GUIDE ROADMAPPING  08/11/2020   IR EMBO TUMOR ORGAN ISCHEMIA INFARCT INC GUIDE ROADMAPPING  08/26/2020   IR IMAGING GUIDED PORT INSERTION  02/23/2020   IR RADIOLOGIST EVAL & MGMT  07/14/2020   IR RADIOLOGIST EVAL & MGMT  09/30/2020   IR RADIOLOGIST EVAL & MGMT  12/01/2020   IR US GUIDE VASC ACCESS LEFT  08/26/2020   IR US GUIDE VASC ACCESS RIGHT  08/11/2020    I have reviewed the social history and family history with the patient and they are unchanged from previous note.  ALLERGIES:  has No Known  Allergies.  MEDICATIONS:  Current Outpatient Medications  Medication Sig Dispense Refill   allopurinol (ZYLOPRIM) 100 MG tablet Take 100 mg by mouth daily.      amLODipine (NORVASC) 5 MG tablet      colchicine 0.6 MG tablet Take 1 tablet (0.6 mg total) by mouth daily as needed (gout flare). Take daily for 3 days, then as needed for gout flare 30 tablet 0   diltiazem (CARDIZEM CD) 240 MG 24 hr capsule Take 1 capsule (240 mg total) by mouth daily. 90 capsule 3   ELIQUIS 5 MG TABS tablet TAKE 1 TABLET TWICE DAILY 180 tablet 1   fenofibrate (TRICOR) 145 MG tablet Take 145 mg by mouth daily.     finasteride (PROSCAR) 5 MG tablet Take 5 mg by mouth daily.     furosemide (LASIX) 40 MG tablet Take 40 mg twice a day 90 tablet 3   gabapentin (NEURONTIN) 100 MG capsule Take 100 mg by mouth at bedtime.     hydrALAZINE (APRESOLINE) 50 MG tablet TAKE 2 TABLETS BY MOUTH IN THE MORNING, 1 TABLET AT LUNCH AND 2 IN THE EVENING 450 tablet 1   hydrALAZINE (APRESOLINE) 50 MG tablet Take 50 mg by mouth 2 (two) times daily.     KLOR-CON M20 20 MEQ tablet TAKE 1 TABLET BY MOUTH TWICE A DAY 60 tablet 1   KLOR-CON M20 20 MEQ tablet TAKE 1 TABLET BY MOUTH TWICE A DAY 180 tablet 3   lidocaine-prilocaine (EMLA) cream Apply 1 application topically as needed. 30 g 3   metoprolol succinate (TOPROL XL) 25 MG 24 hr tablet Take 1 tablet (25 mg total) by mouth daily. 90 tablet 3   metoprolol tartrate (LOPRESSOR) 50 MG tablet      pantoprazole (PROTONIX) 40 MG tablet TAKE 1 TABLET BY MOUTH TWICE A DAY 180 tablet 2   pravastatin (PRAVACHOL) 40 MG tablet Take 40 mg by mouth daily.     sucralfate (CARAFATE) 1 g tablet Take 1 tablet (1 g total) by mouth every 6 (six) hours as needed. Please schedule a yearly follow up for further refills: [phone] 60 tablet 0   tamsulosin (FLOMAX) 0.4 MG CAPS capsule Take 0.4 mg by mouth daily.     No current facility-administered medications for this visit.   Facility-Administered Medications Ordered in Other Visits  Medication Dose Route Frequency Provider Last Rate Last Admin   dextrose 5 % solution   Intravenous Once [name], MD        durvalumab (IMFINZI) 1,500 mg in sodium chloride 0.9 % 100 mL chemo infusion  1,500 mg Intravenous Once [name], MD       fluorouracil (ADRUCIL) 5,300 mg in sodium chloride 0.9 % 144 mL chemo infusion  2,400 mg/m2 (Treatment Plan Recorded) Intravenous 1 day or 1 dose [name], MD       heparin lock flush 100 unit/mL  500 Units Intracatheter Once PRN [name], MD       leucovorin 880 mg in dextrose 5 % 250 mL infusion  400 mg/m2 (Treatment Plan Recorded) Intravenous Once [name], MD       oxaliplatin (ELOXATIN) 165 mg in dextrose 5 % 500 mL chemo infusion  75 mg/m2 (Treatment Plan Recorded) Intravenous Once [name], MD       palonosetron (ALOXI) injection 0.25 mg  0.25 mg Intravenous Once [name], MD       sodium chloride flush (NS) 0.9 % injection 10 mL  10 mL Intracatheter PRN Burr Medico,  Krista Blue, MD        PHYSICAL EXAMINATION: ECOG PERFORMANCE STATUS: 0 - Asymptomatic  Vitals:   06/13/21 0922  BP: 138/72  Pulse: 74  Resp: 18  Temp: 98.3 F (36.8 C)  SpO2: 100%   Filed Weights   06/13/21 0922  Weight: 217 lb (98.4 kg)    GENERAL:alert, no distress and comfortable SKIN: no rash.  Scattered ecchymoses to bilateral arms EYES:  sclera clear OROPHARYNX: No thrush or ulcers LUNGS: clear with normal breathing effort HEART: In A. fib, no lower extremity edema ABDOMEN:abdomen soft, non-tender and normal bowel sounds.  No hepatomegaly or palpable mass NEURO: alert & oriented x 3 with fluent speech, no focal motor/sensory deficits PAC without erythema  LABORATORY DATA:  I have reviewed the data as listed CBC Latest Ref Rng & Units 06/13/2021 06/08/2021 06/01/2021  WBC 4.0 - 10.5 K/uL 3.3(L) 2.8(L) 4.0  Hemoglobin 13.0 - 17.0 g/dL 11.2(L) 11.3(L) 11.3(L)  Hematocrit 39.0 - 52.0 % 33.3(L) 33.7(L) 34.7(L)  Platelets 150 - 400 K/uL 119(L) 111(L) 129(L)     CMP Latest Ref Rng & Units 06/13/2021 06/08/2021 06/01/2021  Glucose 70 - 99 mg/dL 193(H) 106(H) 136(H)  BUN 8 - 23 mg/dL $Remove'17 18 21   'VlnZEsj$ Creatinine 0.61 - 1.24 mg/dL 1.73(H) 1.69(H) 1.92(H)  Sodium 135 - 145 mmol/L 137 139 139  Potassium 3.5 - 5.1 mmol/L 4.1 3.8 3.8  Chloride 98 - 111 mmol/L 104 105 104  CO2 22 - 32 mmol/L $RemoveB'24 24 26  'LGzRVchH$ Calcium 8.9 - 10.3 mg/dL 9.4 9.2 9.4  Total Protein 6.5 - 8.1 g/dL 7.2 7.3 7.2  Total Bilirubin 0.3 - 1.2 mg/dL 0.4 0.6 0.5  Alkaline Phos 38 - 126 U/L 62 62 61  AST 15 - 41 U/L 44(H) 36 31  ALT 0 - 44 U/L $Remo'22 24 21      'PJYoW$ RADIOGRAPHIC STUDIES: I have personally reviewed the radiological images as listed and agreed with the findings in the report. No results found.   ASSESSMENT & PLAN: CRANFORD BLESSINGER is a 75 y.o. male with     1. Intrahepatic cholangiocarcinoma, cT2N0Mx, unresectable, with indeterminate lung nodules  -Diagnosed in 07/2019. CT scans and MRI liver show a large 7.3cm mass in the right hepatic lobe which abuts portal vein. Both our local surgeon Dr. Barry Dienes and Dr Carlis Abbott at [facility] concluded that cancer is not resectable, and therefore not likely curable, due to the invasion to portal vein. -He began palliative systemic treatment standard first line chemo with IV Cisplatin and Gemcitabine 2 weeks on/1 week off. Started 09/29/19, tolerating well cisplatin was held with cycle 9 on 03/23/2020 due to fluid status/A. fib, now on maintenance gemcitabine alone -His FO results showed MSI stable disease, IDH1 mutations he may benefit from IDH inhibitor in the future -he went to single agent gemcitabine q2 weeks on 12/24/20 until disease progression in liver on 03/16/21 MRI with concern for new liver lesion  -Started second line FOLFOX plus durvalumab 04/05/21   2. Nausea, Low food intake and weight loss   -With pain from GERD and nausea he initially lost 30 pounds.  -followed by dietician -weight fluctuation has some component of fluid retention in lower extremities -stable overall    3. Afib, CHF -diagnosed after PAC placement on 02/23/20. -CHADS2 score 2, moderate risk for stroke. Started on  metoprolol and eliquis on 03/01/20. Tolerating well without bleeding.  -He was started on lasix 20 mg every other day after consult with Dr. Gwenlyn Found; his dyspnea  and leg edema have improved -He had cardioversion on 05/19/2020 -continue f/u with cardiology, Dr. Gwenlyn Found    4. DM, HTN, HLD, Gout -On Metformin, amlodipine, lisinopril., allopurinol -Continue to f/u with his PCP  -now on metoprolol and Eliquis for Afib   5. Nicotine Use -He never smoked but has been chewing Tobacco for the past 60 years. He no longer drinks alcohol and tells me he has quit smoking (11/25/19).    6. GERD and gastritis, history of esophageal candidiasis -He had repeated EGD with Dr. Havery Moros in 07/2019. 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 May repeat EGD in future  -GERD resolved on PPI and carafate  Disposition: Mr. Lagrow appears stable.  He completed 5 cycles of FOLFOX and 2 cycles of durvalumab, immunotherapy held on 7/13 for elevated serum creatinine which has improved to baseline. No other s/sx of immune related reaction. He tolerates chemo with mild cold sensitivity and fatigue.  Side effects are well managed with supportive care at home.  He is able to recover and function well.  There is no clinical evidence of disease progression today.  CBC and CMP reviewed, adequate to proceed with cycle 6 FOLFOX and cycle 3 durvalumab today, no dosage adjustments.  The plan is to restage with abdominal MRI w/wo contrast and CT chest wo contrast before next visit in 2 weeks.  Orders Placed This Encounter  Procedures   MR Abdomen W Wo Contrast    Standing Status:   Future    Standing Expiration Date:   06/13/2022    Order Specific Question:   If indicated for the ordered procedure, I authorize the administration of contrast media per Radiology protocol    Answer:   Yes    Order Specific Question:   What is the patient's  sedation requirement?    Answer:   No Sedation    Order Specific Question:   Does the patient have a pacemaker or implanted devices?    Answer:   No    Order Specific Question:   Preferred imaging location?    Answer:   [facility] (table limit - 550 lbs)   CT Chest Wo Contrast    Standing Status:   Future    Standing Expiration Date:   06/13/2022    Order Specific Question:   Preferred imaging location?    Answer:   [facility]    All questions were answered. The patient knows to call the clinic with any problems, questions or concerns. No barriers to learning were detected.     Alla Feeling, NP 06/13/21

## 2021-06-13 ENCOUNTER — Inpatient Hospital Stay: Payer: Medicare HMO | Admitting: Nurse Practitioner

## 2021-06-13 ENCOUNTER — Inpatient Hospital Stay: Payer: Medicare HMO

## 2021-06-13 ENCOUNTER — Other Ambulatory Visit: Payer: Self-pay

## 2021-06-13 ENCOUNTER — Encounter: Payer: Self-pay | Admitting: Nurse Practitioner

## 2021-06-13 VITALS — BP 138/72 | HR 74 | Temp 98.3°F | Resp 18 | Ht 69.0 in | Wt 217.0 lb

## 2021-06-13 DIAGNOSIS — C221 Intrahepatic bile duct carcinoma: Secondary | ICD-10-CM

## 2021-06-13 DIAGNOSIS — Z5111 Encounter for antineoplastic chemotherapy: Secondary | ICD-10-CM | POA: Diagnosis not present

## 2021-06-13 DIAGNOSIS — Z95828 Presence of other vascular implants and grafts: Secondary | ICD-10-CM

## 2021-06-13 DIAGNOSIS — Z452 Encounter for adjustment and management of vascular access device: Secondary | ICD-10-CM | POA: Diagnosis not present

## 2021-06-13 DIAGNOSIS — Z7189 Other specified counseling: Secondary | ICD-10-CM

## 2021-06-13 DIAGNOSIS — Z79899 Other long term (current) drug therapy: Secondary | ICD-10-CM | POA: Diagnosis not present

## 2021-06-13 LAB — CMP (CANCER CENTER ONLY)
ALT: 22 U/L (ref 0–44)
AST: 44 U/L — ABNORMAL HIGH (ref 15–41)
Albumin: 3.4 g/dL — ABNORMAL LOW (ref 3.5–5.0)
Alkaline Phosphatase: 62 U/L (ref 38–126)
Anion gap: 9 (ref 5–15)
BUN: 17 mg/dL (ref 8–23)
CO2: 24 mmol/L (ref 22–32)
Calcium: 9.4 mg/dL (ref 8.9–10.3)
Chloride: 104 mmol/L (ref 98–111)
Creatinine: 1.73 mg/dL — ABNORMAL HIGH (ref 0.61–1.24)
GFR, Estimated: 41 mL/min — ABNORMAL LOW (ref >60)
Glucose, Bld: 193 mg/dL — ABNORMAL HIGH (ref 70–99)
Potassium: 4.1 mmol/L (ref 3.5–5.1)
Sodium: 137 mmol/L (ref 135–145)
Total Bilirubin: 0.4 mg/dL (ref 0.3–1.2)
Total Protein: 7.2 g/dL (ref 6.5–8.1)

## 2021-06-13 LAB — CBC WITH DIFFERENTIAL (CANCER CENTER ONLY)
Abs Immature Granulocytes: 0.01 10*3/uL (ref 0.00–0.07)
Basophils Absolute: 0 10*3/uL (ref 0.0–0.1)
Basophils Relative: 1 %
Eosinophils Absolute: 0 10*3/uL (ref 0.0–0.5)
Eosinophils Relative: 1 %
HCT: 33.3 % — ABNORMAL LOW (ref 39.0–52.0)
Hemoglobin: 11.2 g/dL — ABNORMAL LOW (ref 13.0–17.0)
Immature Granulocytes: 0 %
Lymphocytes Relative: 18 %
Lymphs Abs: 0.6 10*3/uL — ABNORMAL LOW (ref 0.7–4.0)
MCH: 32.7 pg (ref 26.0–34.0)
MCHC: 33.6 g/dL (ref 30.0–36.0)
MCV: 97.1 fL (ref 80.0–100.0)
Monocytes Absolute: 0.8 10*3/uL (ref 0.1–1.0)
Monocytes Relative: 24 %
Neutro Abs: 1.9 10*3/uL (ref 1.7–7.7)
Neutrophils Relative %: 56 %
Platelet Count: 119 10*3/uL — ABNORMAL LOW (ref 150–400)
RBC: 3.43 MIL/uL — ABNORMAL LOW (ref 4.22–5.81)
RDW: 17.9 % — ABNORMAL HIGH (ref 11.5–15.5)
WBC Count: 3.3 10*3/uL — ABNORMAL LOW (ref 4.0–10.5)
nRBC: 0 % (ref 0.0–0.2)

## 2021-06-13 MED ORDER — SODIUM CHLORIDE 0.9 % IV SOLN
1500.0000 mg | Freq: Once | INTRAVENOUS | Status: AC
  Administered 2021-06-13: 1500 mg via INTRAVENOUS
  Filled 2021-06-13: qty 30

## 2021-06-13 MED ORDER — PALONOSETRON HCL INJECTION 0.25 MG/5ML
0.2500 mg | Freq: Once | INTRAVENOUS | Status: AC
  Administered 2021-06-13: 0.25 mg via INTRAVENOUS

## 2021-06-13 MED ORDER — OXALIPLATIN CHEMO INJECTION 100 MG/20ML
75.0000 mg/m2 | Freq: Once | INTRAVENOUS | Status: AC
  Administered 2021-06-13: 165 mg via INTRAVENOUS
  Filled 2021-06-13: qty 33

## 2021-06-13 MED ORDER — LEUCOVORIN CALCIUM INJECTION 350 MG
400.0000 mg/m2 | Freq: Once | INTRAVENOUS | Status: AC
  Administered 2021-06-13: 880 mg via INTRAVENOUS
  Filled 2021-06-13: qty 44

## 2021-06-13 MED ORDER — SODIUM CHLORIDE 0.9 % IV SOLN
5000.0000 mg | INTRAVENOUS | Status: DC
  Administered 2021-06-13: 5000 mg via INTRAVENOUS
  Filled 2021-06-13: qty 100

## 2021-06-13 MED ORDER — SODIUM CHLORIDE 0.9% FLUSH
10.0000 mL | INTRAVENOUS | Status: DC | PRN
  Filled 2021-06-13: qty 10

## 2021-06-13 MED ORDER — DEXTROSE 5 % IV SOLN
Freq: Once | INTRAVENOUS | Status: AC
  Filled 2021-06-13: qty 250

## 2021-06-13 MED ORDER — SODIUM CHLORIDE 0.9 % IV SOLN
Freq: Once | INTRAVENOUS | Status: AC
  Filled 2021-06-13: qty 250

## 2021-06-13 MED ORDER — SODIUM CHLORIDE 0.9% FLUSH
10.0000 mL | Freq: Once | INTRAVENOUS | Status: AC
  Administered 2021-06-13: 10 mL
  Filled 2021-06-13: qty 10

## 2021-06-13 MED ORDER — HEPARIN SOD (PORK) LOCK FLUSH 100 UNIT/ML IV SOLN
500.0000 [IU] | Freq: Once | INTRAVENOUS | Status: DC | PRN
  Filled 2021-06-13: qty 5

## 2021-06-13 MED ORDER — PALONOSETRON HCL INJECTION 0.25 MG/5ML
INTRAVENOUS | Status: AC
  Filled 2021-06-13: qty 5

## 2021-06-13 NOTE — Progress Notes (Signed)
Imfinzi held on 06/01/21 due to elevated Scr. Ok to proceed with Imfinzi today.  Raul [place], [place], BCPS, BCOP 06/13/2021 10:10 AM

## 2021-06-13 NOTE — Progress Notes (Signed)
Ok to treat with Scr of 1.73 per Cira Rue, NP. Pt made aware to cut back on lasix per NP recommendations.

## 2021-06-13 NOTE — Patient Instructions (Signed)
[facility]  Discharge Instructions: Thank you for choosing [facility] to provide your oncology and hematology care.   If you have a lab appointment with the [facility], please go directly to the [facility] and check in at the registration area.   Wear comfortable clothing and clothing appropriate for easy access to any Portacath or PICC line.   We strive to give you quality time with your provider. You may need to reschedule your appointment if you arrive late (15 or more minutes).  Arriving late affects you and other patients whose appointments are after yours.  Also, if you miss three or more appointments without notifying the office, you may be dismissed from the clinic at the provider's discretion.      For prescription refill requests, have your pharmacy contact our office and allow 72 hours for refills to be completed.    Today you received the following chemotherapy and/or immunotherapy agents folfox and imfinzi      To help prevent nausea and vomiting after your treatment, we encourage you to take your nausea medication as directed.  BELOW ARE SYMPTOMS THAT SHOULD BE REPORTED IMMEDIATELY: *FEVER GREATER THAN 100.4 F (38 C) OR HIGHER *CHILLS OR SWEATING *NAUSEA AND VOMITING THAT IS NOT CONTROLLED WITH YOUR NAUSEA MEDICATION *UNUSUAL SHORTNESS OF BREATH *UNUSUAL BRUISING OR BLEEDING *URINARY PROBLEMS (pain or burning when urinating, or frequent urination) *BOWEL PROBLEMS (unusual diarrhea, constipation, pain near the anus) TENDERNESS IN MOUTH AND THROAT WITH OR WITHOUT PRESENCE OF ULCERS (sore throat, sores in mouth, or a toothache) UNUSUAL RASH, SWELLING OR PAIN  UNUSUAL VAGINAL DISCHARGE OR ITCHING   Items with * indicate a potential emergency and should be followed up as soon as possible or go to the Emergency Department if any problems should occur.  Please show the CHEMOTHERAPY ALERT CARD or IMMUNOTHERAPY ALERT CARD at  check-in to the Emergency Department and triage nurse.  Should you have questions after your visit or need to cancel or reschedule your appointment, please contact [facility]  Dept: [phone]  and follow the prompts.  Office hours are 8:00 a.m. to 4:30 p.m. Monday - Friday. Please note that voicemails left after 4:00 p.m. may not be returned until the following business day.  We are closed weekends and major holidays. You have access to a nurse at all times for urgent questions. Please call the main number to the clinic Dept: [phone] and follow the prompts.   For any non-urgent questions, you may also contact your provider using MyChart. We now offer e-Visits for anyone 47 and older to request care online for non-urgent symptoms. For details visit [email or web address].   Also download the MyChart app! Go to the app store, search "MyChart", open the app, select [facility], and log in with your MyChart username and password.  Due to Covid, a mask is required upon entering the hospital/clinic. If you do not have a mask, one will be given to you upon arrival. For doctor visits, patients may have 1 support person aged 73 or older with them. For treatment visits, patients cannot have anyone with them due to current Covid guidelines and our immunocompromised population.

## 2021-06-14 ENCOUNTER — Ambulatory Visit: Payer: Medicare HMO | Admitting: Cardiovascular Disease

## 2021-06-14 ENCOUNTER — Encounter: Payer: Self-pay | Admitting: Cardiovascular Disease

## 2021-06-14 ENCOUNTER — Telehealth: Payer: Self-pay | Admitting: Hematology

## 2021-06-14 VITALS — BP 120/72 | HR 58 | Ht 69.0 in | Wt 217.0 lb

## 2021-06-14 DIAGNOSIS — I4811 Longstanding persistent atrial fibrillation: Secondary | ICD-10-CM

## 2021-06-14 DIAGNOSIS — I5031 Acute diastolic (congestive) heart failure: Secondary | ICD-10-CM

## 2021-06-14 DIAGNOSIS — I10 Essential (primary) hypertension: Secondary | ICD-10-CM | POA: Diagnosis not present

## 2021-06-14 DIAGNOSIS — E785 Hyperlipidemia, unspecified: Secondary | ICD-10-CM

## 2021-06-14 NOTE — Assessment & Plan Note (Signed)
History of essential hypertension blood pressure measured today 120/72.  He is on diltiazem, hydralazine and metoprolol.

## 2021-06-14 NOTE — Patient Instructions (Signed)
  Follow-Up: At [facility], you and your health needs are our priority.  As part of our continuing mission to provide you with exceptional heart care, we have created designated Provider Care Teams.  These Care Teams include your primary Cardiologist (physician) and Advanced Practice Providers (APPs -  Physician Assistants and Nurse Practitioners) who all work together to provide you with the care you need, when you need it.  We recommend signing up for the patient portal called "MyChart".  Sign up information is provided on this After Visit Summary.  MyChart is used to connect with patients for Virtual Visits (Telemedicine).  Patients are able to view lab/test results, encounter notes, upcoming appointments, etc.  Non-urgent messages can be sent to your provider as well.   To learn more about what you can do with MyChart, go to [email or web address].    Your next appointment:   12 month(s)  The format for your next appointment:   In Person  Provider:   Jonathan Berry, MD   

## 2021-06-14 NOTE — Progress Notes (Signed)
06/14/2021 Brandon Sherman   Feb 24, 1946  ID:[id number]  Primary Physician Brandon Reel, PA Primary Cardiologist: Brandon Harp MD Brandon Sherman, [place]  HPI:  Brandon Sherman is a 75 y.o.  moderately overweight married Caucasian male father of 42, grandfather of 5 grandchildren who is retired from working at [employer] for 52 years .  His wife Brandon Guiles is also a patient of mine, and accompanies him today.  He was referred by Brandon Dow, PA for atrial fibrillation.  I last saw him in the office 08/24/2020. His risk factors include treated hypertension, diabetes and hyperlipidemia.  Both his mother and sister had known CAD.  He is never had a heart attack or stroke.  He unfortunately was diagnosed with liver cancer back in September of last year and is begun on chemotherapy since that time.  A Port-A-Cath was placed 2 months ago which time he was noted to be in A. fib and he was started on Eliquis 3 weeks ago by his PCP.  He has complained of increased dyspnea on exertion over the last 6 months but denies chest pain.  He does not have history of heavy alcohol abuse which was discontinued at the time of his liver cancer diagnosis.   After being on appropriate doses of Eliquis for at least 4 weeks he underwent successful outpatient cardioversion by Dr. Meda Sherman 04/27/2020.  He apparently went back in A. fib a week later.  Does complain of some dyspnea on exertion.  He has normal LV function by 2D echo and a negative Myoview stress test.  He does have hypertension with blood pressures in the 170 range.   Since I saw him 9 months ago he continues to do well.  He continues to get chemotherapy for his liver cancer.  He remains in A. fib rate controlled on Eliquis oral anticoagulation.  He denies chest pain or shortness of breath.    Current Meds  Medication Sig   allopurinol (ZYLOPRIM) 100 MG tablet Take 100 mg by mouth daily.   colchicine 0.6 MG tablet Take 1 tablet (0.6 mg total) by mouth daily as  needed (gout flare). Take daily for 3 days, then as needed for gout flare   diltiazem (CARDIZEM CD) 240 MG 24 hr capsule Take 1 capsule (240 mg total) by mouth daily.   ELIQUIS 5 MG TABS tablet TAKE 1 TABLET TWICE DAILY   fenofibrate (TRICOR) 145 MG tablet Take 145 mg by mouth daily.   finasteride (PROSCAR) 5 MG tablet Take 5 mg by mouth daily.   furosemide (LASIX) 40 MG tablet Take 40 mg twice a day   gabapentin (NEURONTIN) 100 MG capsule Take 100 mg by mouth at bedtime.   hydrALAZINE (APRESOLINE) 50 MG tablet TAKE 2 TABLETS BY MOUTH IN THE MORNING, 1 TABLET AT LUNCH AND 2 IN THE EVENING   hydrALAZINE (APRESOLINE) 50 MG tablet Take 50 mg by mouth 2 (two) times daily.   KLOR-CON M20 20 MEQ tablet TAKE 1 TABLET BY MOUTH TWICE A DAY   KLOR-CON M20 20 MEQ tablet TAKE 1 TABLET BY MOUTH TWICE A DAY   lidocaine-prilocaine (EMLA) cream Apply 1 application topically as needed.   metoprolol succinate (TOPROL XL) 25 MG 24 hr tablet Take 1 tablet (25 mg total) by mouth daily.   metoprolol tartrate (LOPRESSOR) 50 MG tablet    pantoprazole (PROTONIX) 40 MG tablet TAKE 1 TABLET BY MOUTH TWICE A DAY   pravastatin (PRAVACHOL) 40 MG tablet Take  40 mg by mouth daily.   sucralfate (CARAFATE) 1 g tablet Take 1 tablet (1 g total) by mouth every 6 (six) hours as needed. Please schedule a yearly follow up for further refills: [phone]   tamsulosin (FLOMAX) 0.4 MG CAPS capsule Take 0.4 mg by mouth daily.   [DISCONTINUED] amLODipine (NORVASC) 5 MG tablet      No Known Allergies  Social History   Socioeconomic History   Marital status: Married    Spouse name: Not on file   Number of children: 3   Years of education: Not on file   Highest education level: Not on file  Occupational History   Not on file  Tobacco Use   Smoking status: Never   Smokeless tobacco: Current    Types: Chew   Tobacco comments:    for 60 years, 1-2 daily   Vaping Use   Vaping Use: Never used  Substance and Sexual Activity    Alcohol use: Not Currently    Comment: occasionally   Drug use: Never   Sexual activity: Not on file  Other Topics Concern   Not on file  Social History Narrative   Not on file   Social Determinants of Health   Financial Resource Strain: Not on file  Food Insecurity: Not on file  Transportation Needs: Not on file  Physical Activity: Not on file  Stress: Not on file  Social Connections: Not on file  Intimate Partner Violence: Not on file     Review of Systems: General: negative for chills, fever, night sweats or weight changes.  Cardiovascular: negative for chest pain, dyspnea on exertion, edema, orthopnea, palpitations, paroxysmal nocturnal dyspnea or shortness of breath Dermatological: negative for rash Respiratory: negative for cough or wheezing Urologic: negative for hematuria Abdominal: negative for nausea, vomiting, diarrhea, bright red blood per rectum, melena, or hematemesis Neurologic: negative for visual changes, syncope, or dizziness All other systems reviewed and are otherwise negative except as noted above.    Blood pressure 120/72, pulse (!) 58, height '5\' 9"'$  (1.753 m), weight 217 lb (98.4 kg).  General appearance: alert and no distress Neck: no adenopathy, no carotid bruit, no JVD, supple, symmetrical, trachea midline, and thyroid not enlarged, symmetric, no tenderness/mass/nodules Lungs: clear to auscultation bilaterally Heart: irregularly irregular rhythm Extremities: extremities normal, atraumatic, no cyanosis or edema Pulses: 2+ and symmetric Skin: Skin color, texture, turgor normal. No rashes or lesions Neurologic: Grossly normal  EKG atrial fibrillation with a ventricular sponsor of 58 and low limb voltage.  I personally reviewed this EKG.  ASSESSMENT AND PLAN:   Dyslipidemia, goal LDL below 70 History of dyslipidemia on statin therapy and fenofibrate with lipid profile performed 03/18/2021 revealing total cholesterol 47, LDL of 81 and HDL  34.  Essential hypertension History of essential hypertension blood pressure measured today 120/72.  He is on diltiazem, hydralazine and metoprolol.  Atrial fibrillation ([place]) History of persistent A. fib rate controlled on Eliquis oral anticoagulation.  Acute diastolic CHF (congestive heart failure) (HCC) History of diastolic heart failure with recent echo revealing normal LV systolic function on furosemide.     Brandon Harp MD FACP,FACC,FAHA, [facility] 06/14/2021 1:33 PM

## 2021-06-14 NOTE — Assessment & Plan Note (Signed)
History of persistent A-fib rate controlled on Eliquis oral anticoagulation. 

## 2021-06-14 NOTE — Assessment & Plan Note (Signed)
History of dyslipidemia on statin therapy and fenofibrate with lipid profile performed 03/18/2021 revealing total cholesterol 47, LDL of 81 and HDL 34.

## 2021-06-14 NOTE — Telephone Encounter (Signed)
Left message with follow-up appointments per 7/25 los. 

## 2021-06-14 NOTE — Assessment & Plan Note (Signed)
History of diastolic heart failure with recent echo revealing normal LV systolic function on furosemide.

## 2021-06-15 ENCOUNTER — Inpatient Hospital Stay: Payer: Medicare HMO

## 2021-06-15 ENCOUNTER — Other Ambulatory Visit: Payer: Self-pay

## 2021-06-15 VITALS — BP 144/78 | HR 50 | Temp 99.3°F | Resp 18

## 2021-06-15 DIAGNOSIS — C221 Intrahepatic bile duct carcinoma: Secondary | ICD-10-CM

## 2021-06-15 DIAGNOSIS — Z7189 Other specified counseling: Secondary | ICD-10-CM

## 2021-06-15 DIAGNOSIS — Z5111 Encounter for antineoplastic chemotherapy: Secondary | ICD-10-CM | POA: Diagnosis not present

## 2021-06-15 DIAGNOSIS — Z452 Encounter for adjustment and management of vascular access device: Secondary | ICD-10-CM | POA: Diagnosis not present

## 2021-06-15 DIAGNOSIS — Z79899 Other long term (current) drug therapy: Secondary | ICD-10-CM | POA: Diagnosis not present

## 2021-06-15 MED ORDER — SODIUM CHLORIDE 0.9% FLUSH
10.0000 mL | INTRAVENOUS | Status: DC | PRN
  Administered 2021-06-15: 10 mL
  Filled 2021-06-15: qty 10

## 2021-06-15 MED ORDER — HEPARIN SOD (PORK) LOCK FLUSH 100 UNIT/ML IV SOLN
500.0000 [IU] | Freq: Once | INTRAVENOUS | Status: AC | PRN
  Administered 2021-06-15: 500 [IU]
  Filled 2021-06-15: qty 5

## 2021-06-17 ENCOUNTER — Other Ambulatory Visit: Payer: Self-pay

## 2021-06-17 MED ORDER — METOPROLOL SUCCINATE ER 25 MG PO TB24: 25.0000 mg | ORAL_TABLET | Freq: Every day | ORAL | 3 refills | Status: DC

## 2021-06-23 ENCOUNTER — Other Ambulatory Visit: Payer: Self-pay | Admitting: Cardiovascular Disease

## 2021-06-23 ENCOUNTER — Ambulatory Visit (HOSPITAL_COMMUNITY)
Admission: RE | Admit: 2021-06-23 | Discharge: 2021-06-23 | Disposition: A | Discharge status: Home / Self Care | Payer: Medicare HMO | Source: Ambulatory Visit | Attending: Nurse Practitioner | Admitting: Nurse Practitioner

## 2021-06-23 ENCOUNTER — Other Ambulatory Visit: Payer: Self-pay | Admitting: Nurse Practitioner

## 2021-06-23 ENCOUNTER — Other Ambulatory Visit: Payer: Self-pay | Admitting: Hematology

## 2021-06-23 ENCOUNTER — Other Ambulatory Visit: Payer: Self-pay

## 2021-06-23 DIAGNOSIS — I700 Atherosclerosis of aorta: Secondary | ICD-10-CM | POA: Diagnosis not present

## 2021-06-23 DIAGNOSIS — I82220 Acute embolism and thrombosis of inferior vena cava: Secondary | ICD-10-CM | POA: Diagnosis not present

## 2021-06-23 DIAGNOSIS — K769 Liver disease, unspecified: Secondary | ICD-10-CM | POA: Diagnosis not present

## 2021-06-23 DIAGNOSIS — R911 Solitary pulmonary nodule: Secondary | ICD-10-CM | POA: Diagnosis not present

## 2021-06-23 DIAGNOSIS — C221 Intrahepatic bile duct carcinoma: Secondary | ICD-10-CM

## 2021-06-23 DIAGNOSIS — R918 Other nonspecific abnormal finding of lung field: Secondary | ICD-10-CM | POA: Diagnosis not present

## 2021-06-23 DIAGNOSIS — Z7189 Other specified counseling: Secondary | ICD-10-CM

## 2021-06-23 DIAGNOSIS — J90 Pleural effusion, not elsewhere classified: Secondary | ICD-10-CM | POA: Diagnosis not present

## 2021-06-23 DIAGNOSIS — K8020 Calculus of gallbladder without cholecystitis without obstruction: Secondary | ICD-10-CM | POA: Diagnosis not present

## 2021-06-23 IMAGING — CT CT CHEST W/O CM
2 of 4 series · 15 of 36 positions shown, 18 images · non-contrast
Comparison: CT chest, [DATE]

CLINICAL DATA: Cholangiocarcinoma, follow-up indeterminate lung
nodules and pleural effusions

EXAM:
CT CHEST WITHOUT CONTRAST
TECHNIQUE: Multidetector CT imaging of the chest was performed following the
standard protocol without IV contrast.

[Series 2: thorax · axial · 0.97mm/px · z∈[-347,-67]mm · 12 of 166 slices shown, 15 images]
[im 13/166  mediastinal]
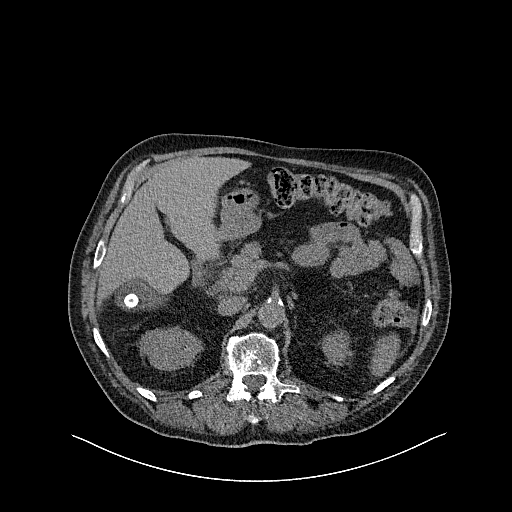
[im 13/166  lung]
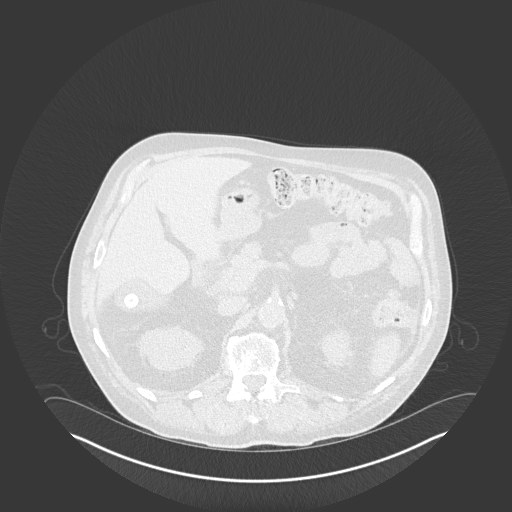
[im 26/166  lung]
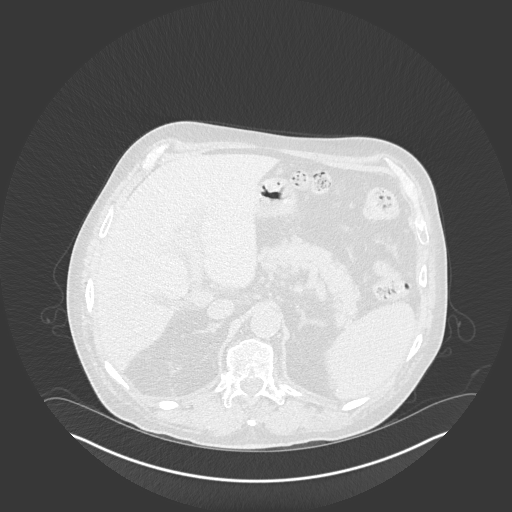
[im 39/166  lung]
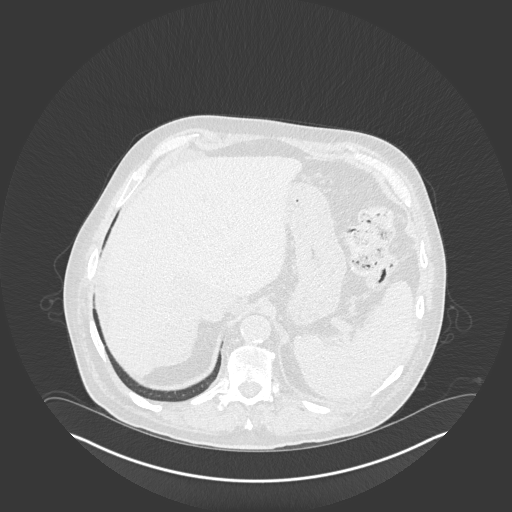
[im 51/166  lung]
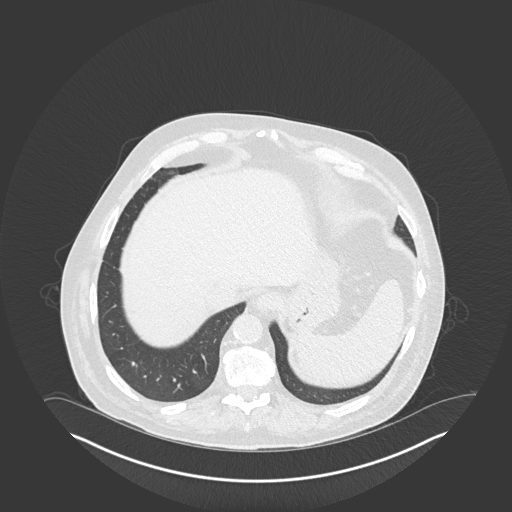
[im 64/166  mediastinal]
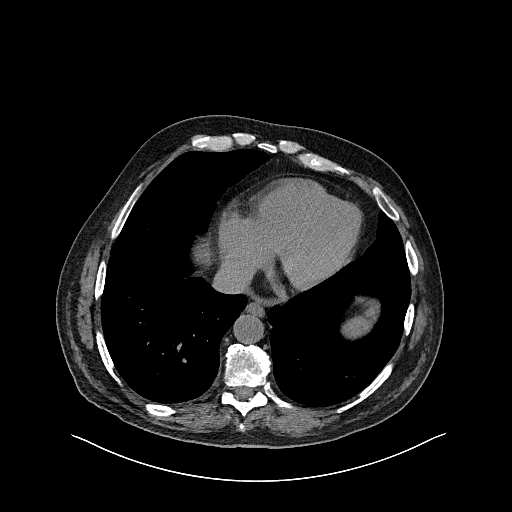
[im 64/166  lung]
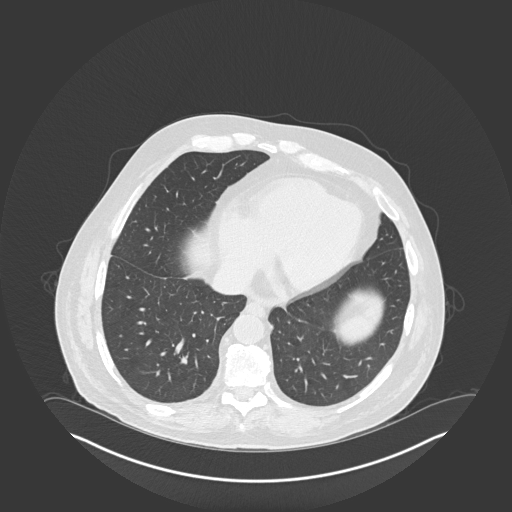
[im 77/166  lung]
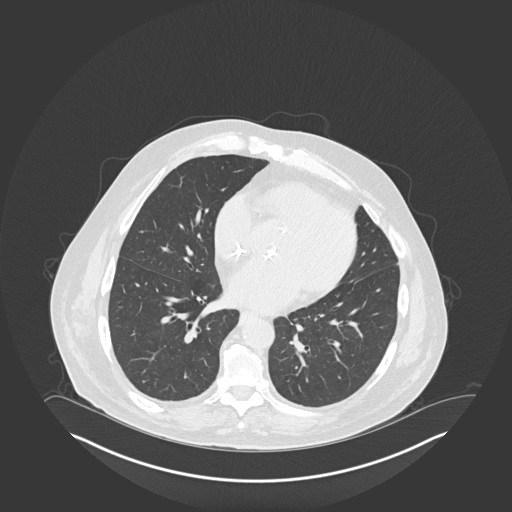
[im 89/166  lung]
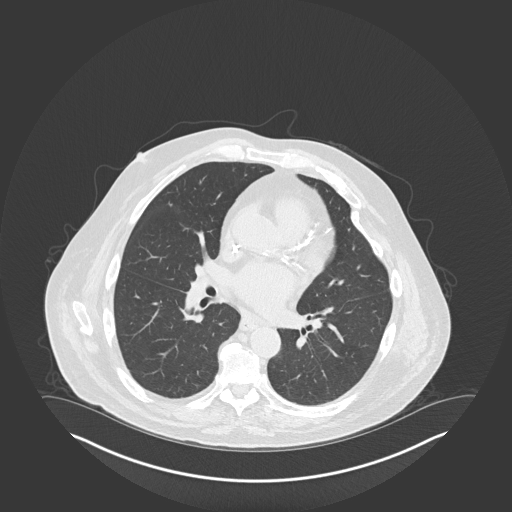
[im 102/166  lung]
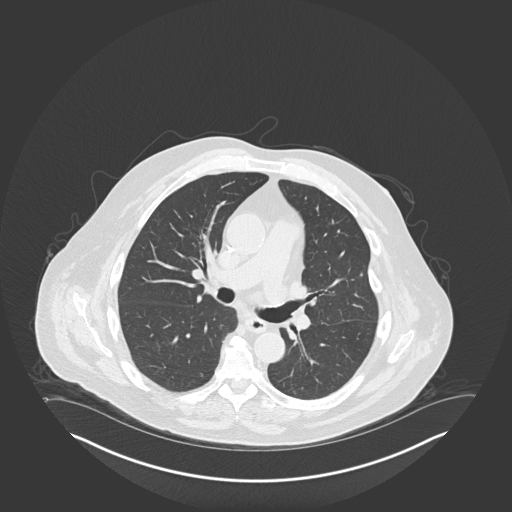
[im 115/166  mediastinal]
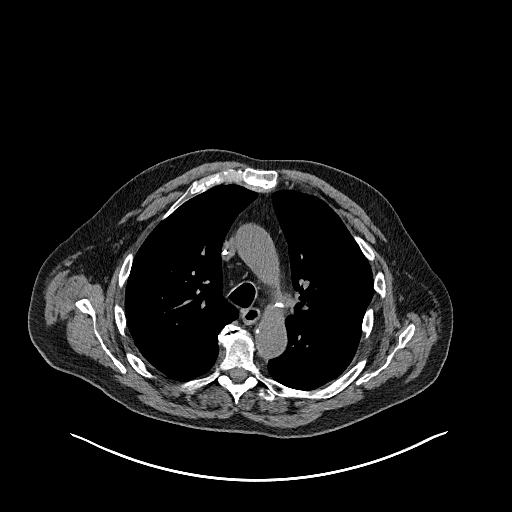
[im 115/166  lung]
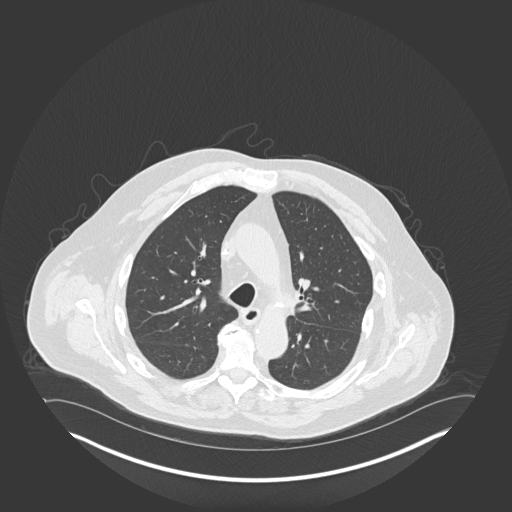
[im 127/166  lung]
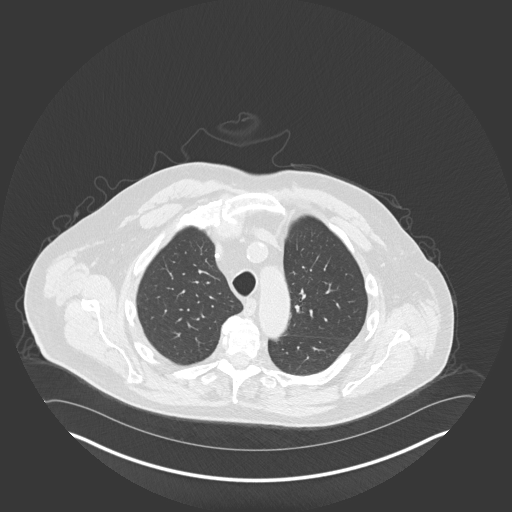
[im 140/166  lung]
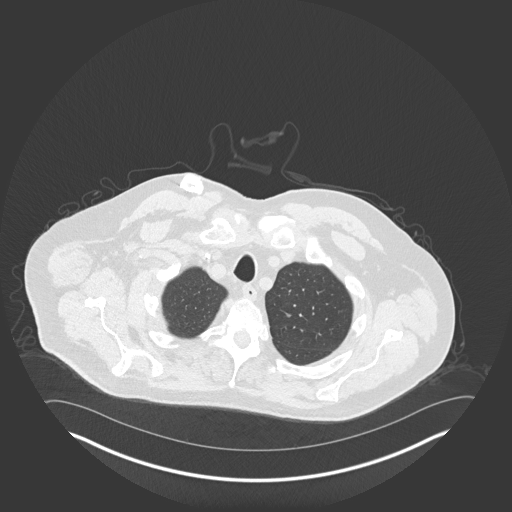
[im 153/166  lung]
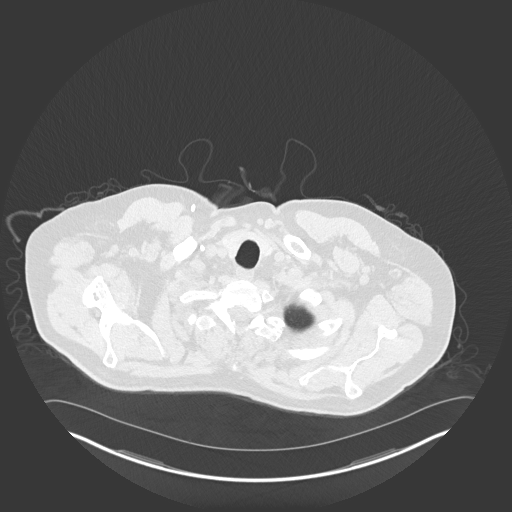

[Series 6: coronal · coronal · 0.74mm/px · 3 of 176 slices shown]
[im 36/176  lung]
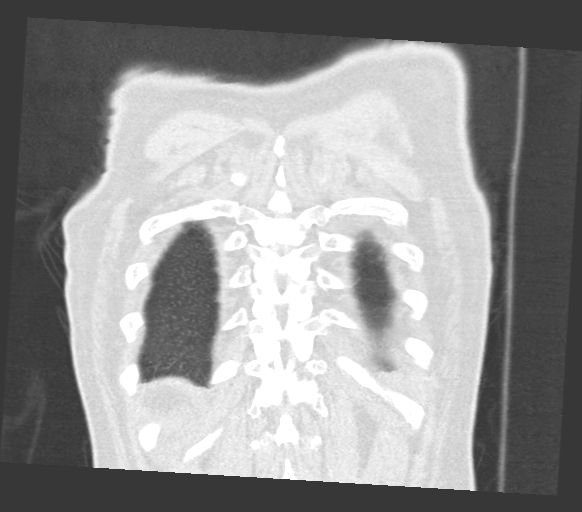
[im 71/176  lung]
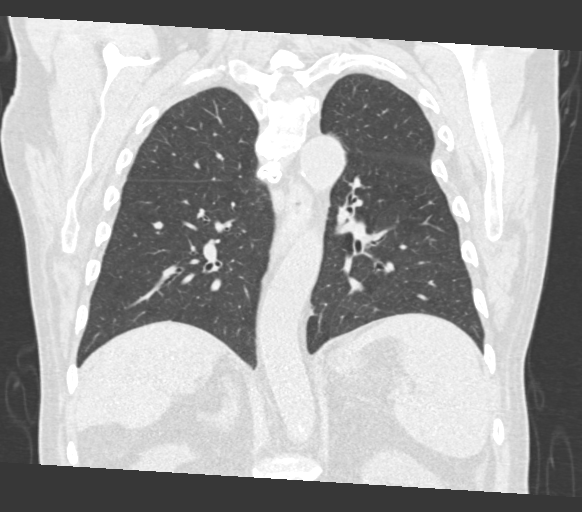
[im 106/176  lung]
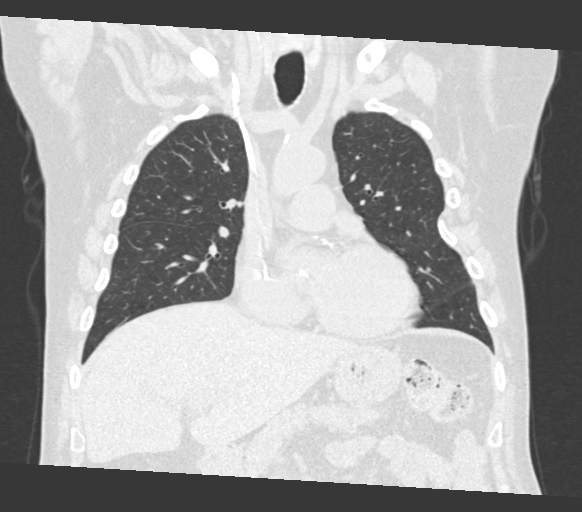

[15 of 36 positions shown; findings below may reference images not displayed]

FINDINGS: Cardiovascular: Right chest port catheter. Aortic atherosclerosis.
Aortic valve calcifications. Normal heart size. Three-vessel
coronary artery calcifications. No pericardial effusion.

Mediastinum/Nodes: No enlarged mediastinal, hilar, or axillary lymph
nodes. Thyroid gland, trachea, and esophagus demonstrate no
significant findings.

Lungs/Pleura: Occasional small pulmonary nodules in the right lung
are stable, for example a 6 mm nodule right lower lobe (series 5,
image 75) and a 3 mm nodule of the lateral segment right middle lobe
(series 5, image 87). Previously seen bilateral pleural effusions
are resolved.

Upper Abdomen: No acute abnormality. Hypodense lesion of the
posterior right lobe of the liver, better characterized by same day
MR (series 2, image 130). Gallstone in the gallbladder.

Musculoskeletal: No chest wall mass or suspicious bone lesions
identified.
IMPRESSION: 1. Occasional small pulmonary nodules in the right lung are stable,
for example a 6 mm nodule right lower lobe and a 3 mm nodule of the
lateral segment right middle lobe. These are nonspecific and likely
benign, incidental sequelae of infection or inflammation, metastatic
disease not favored. Attention on follow-up.
2. Previously seen bilateral pleural effusions are resolved.
3. Hypodense lesion of the posterior right lobe of the liver,
keeping with known cholangiocarcinoma and better characterized by
same day MR.
4. Coronary artery disease.

Aortic Atherosclerosis ([94]-[94]).

## 2021-06-23 IMAGING — MR MR 3D RECON AT [facility]
18 of 21 series · 44 of 48 positions shown · IV contrast (gadavist)
Comparison: [DATE], [DATE]

CLINICAL DATA: Intrahepatic cholangiocarcinoma, assess treatment
response

EXAM:
MRI ABDOMEN WITHOUT AND WITH CONTRAST
TECHNIQUE: Multiplanar multisequence MR imaging of the abdomen was performed
both before and after the administration of intravenous contrast.
CONTRAST:  10mL GADAVIST GADOBUTROL 1 MMOL/ML IV SOLN

[Series 2: DWI · axial · 6.0mm · 1.49mm/px · z∈[-109,+165]mm · 3 of 78 slices shown (1 of 2)]
[im 1/78]
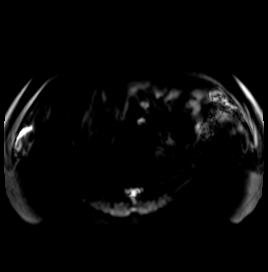
[im 39/78]
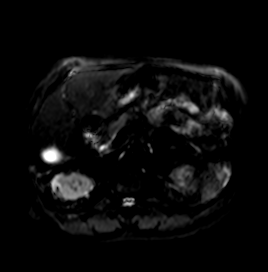
[im 78/78]
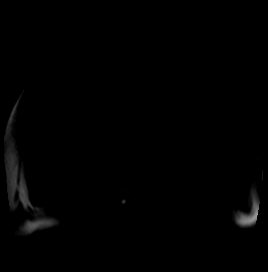

[Series 3: DWI · axial · 6.0mm · 1.49mm/px · 1 of 39 slices shown (2 of 2)]
[im 1/39]
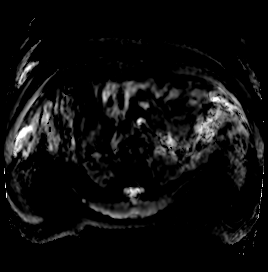

[Series 4: T2 fat-sat · axial · 6.0mm · 1.25mm/px · 1 of 40 slices shown]
[im 1/40]
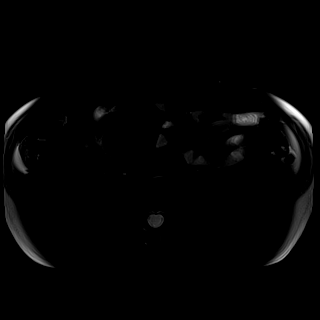

[Series 8: T2 · coronal · 6.0mm · 1.56mm/px · 1 of 45 slices shown (1 of 2)]
[im 1/45]
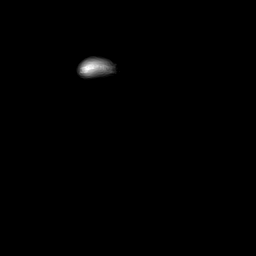

[Series 9: T1 · axial · 3.4mm · 1.25mm/px · z∈[-108,+160]mm · 3 of 80 slices shown (1 of 2)]
[im 1/80]
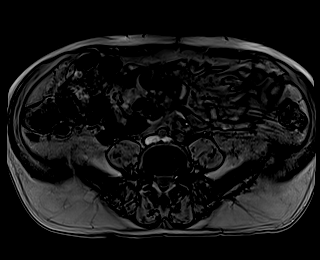
[im 40/80]
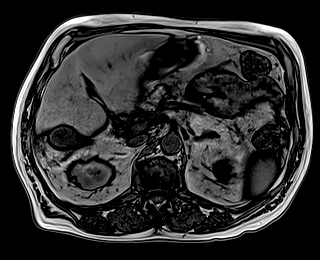
[im 80/80]
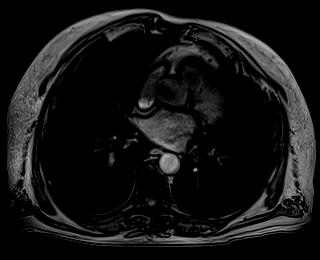

[Series 10: T1 · axial · 3.4mm · 1.25mm/px · z∈[-108,+160]mm · 3 of 80 slices shown (2 of 2)]
[im 1/80]
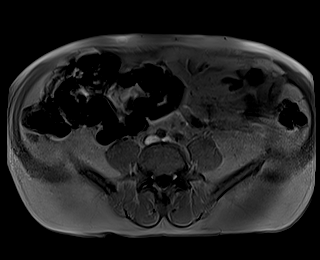
[im 40/80]
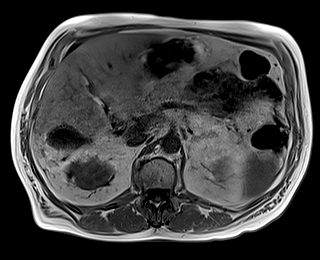
[im 80/80]
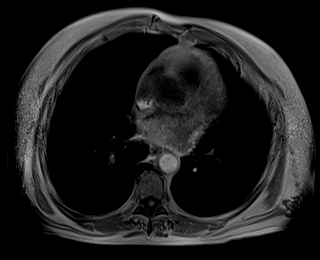

[Series 11: bSSFP · axial · 4.0mm · 0.84mm/px · z∈[-108,+164]mm · 2 of 69 slices shown]
[im 1/69]
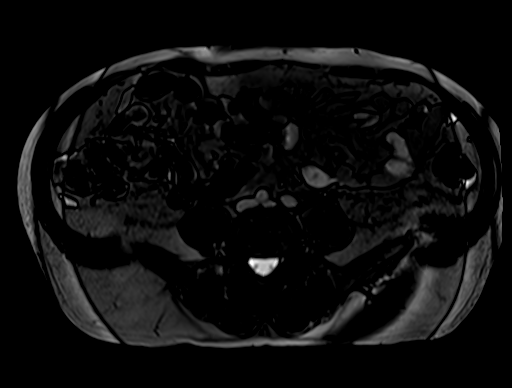
[im 69/69]
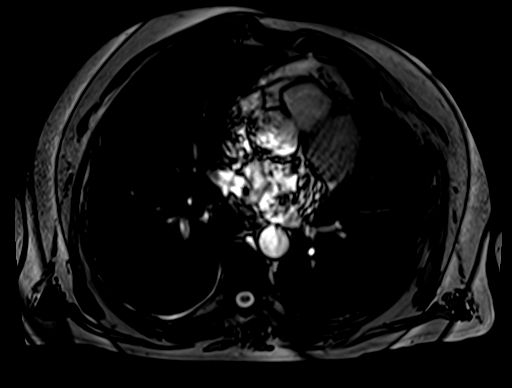

[Series 13: T1 dynamic · axial · 3.0mm · 1.25mm/px · z∈[-125,+160]mm · 3 of 96 slices shown (1 of 10)]
[im 1/96]
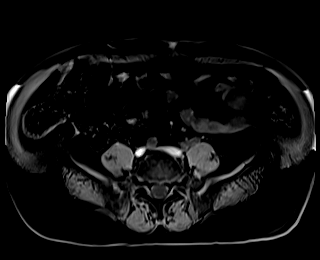
[im 48/96]
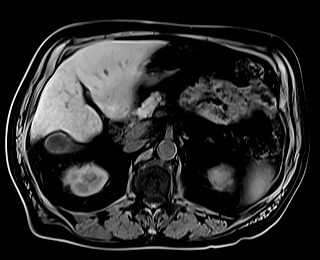
[im 96/96]
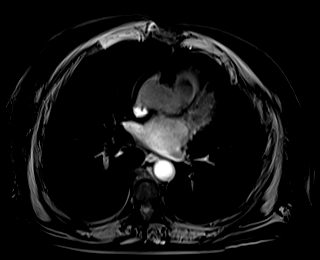

[Series 17: T1 dynamic · axial · 3.0mm · 1.25mm/px · z∈[-125,+160]mm · 3 of 96 slices shown (2 of 10)]
[im 1/96]
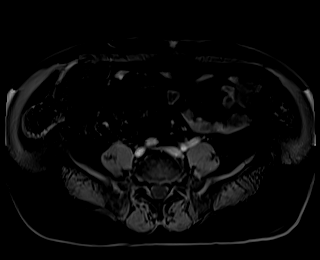
[im 48/96]
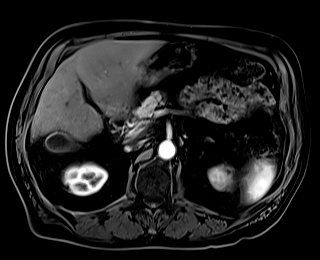
[im 96/96]
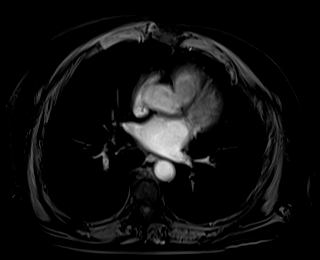

[Series 18: T1 dynamic · axial · 3.0mm · 1.25mm/px · z∈[-125,+160]mm · 3 of 96 slices shown (3 of 10)]
[im 1/96]
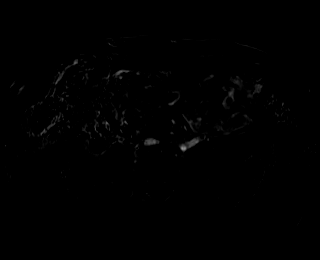
[im 48/96]
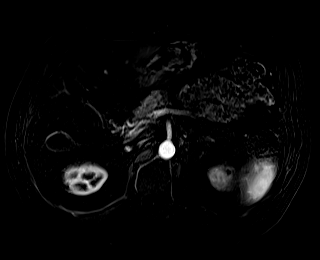
[im 96/96]
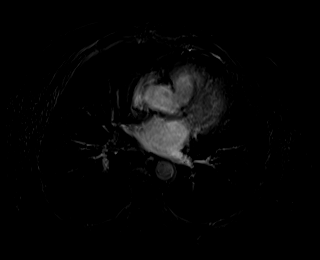

[Series 21: T1 dynamic · axial · 3.0mm · 1.25mm/px · z∈[-125,+160]mm · 3 of 96 slices shown (4 of 10)]
[im 1/96]
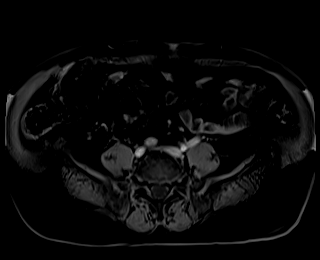
[im 48/96]
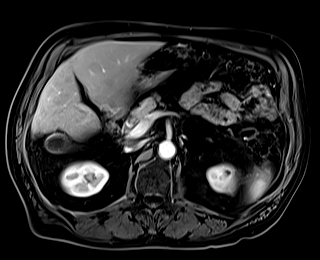
[im 96/96]
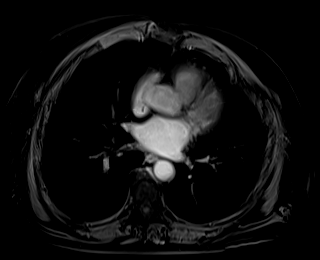

[Series 22: T1 dynamic · axial · 3.0mm · 1.25mm/px · z∈[-125,+160]mm · 3 of 96 slices shown (5 of 10)]
[im 1/96]
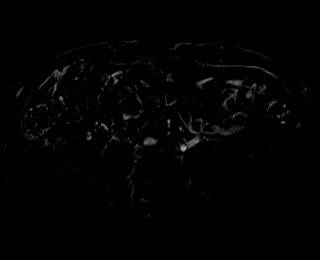
[im 48/96]
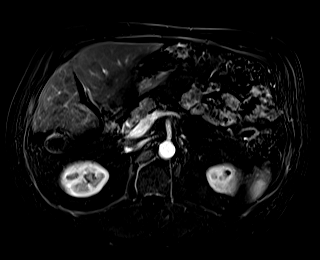
[im 96/96]
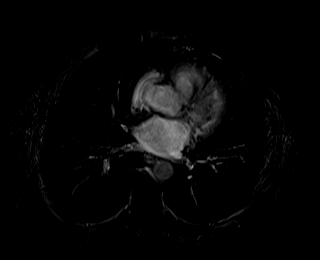

[Series 25: T1 dynamic · axial · 3.0mm · 1.25mm/px · z∈[-125,+160]mm · 3 of 96 slices shown (6 of 10)]
[im 1/96]
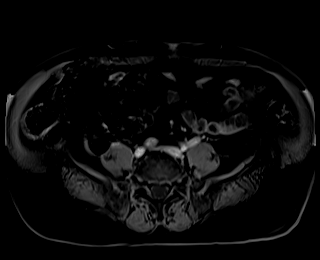
[im 48/96]
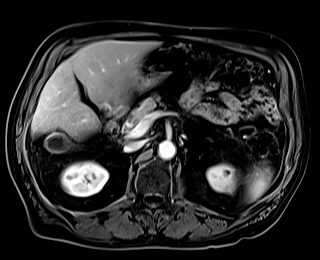
[im 96/96]
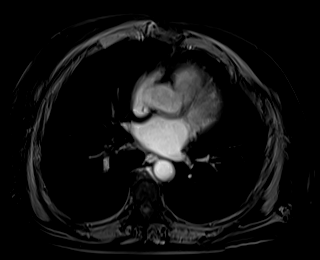

[Series 26: T1 dynamic · axial · 3.0mm · 1.25mm/px · z∈[-125,+160]mm · 3 of 96 slices shown (7 of 10)]
[im 1/96]
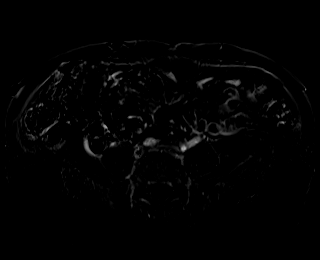
[im 48/96]
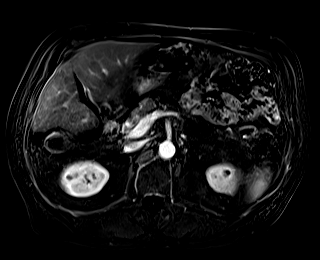
[im 96/96]
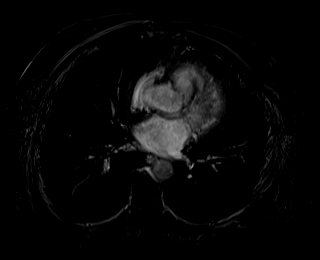

[Series 28: T1 dynamic · coronal · 5.0mm · 1.56mm/px · 2 of 60 slices shown (8 of 10)]
[im 1/60]
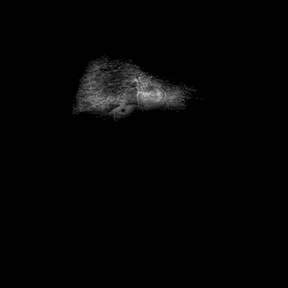
[im 60/60]
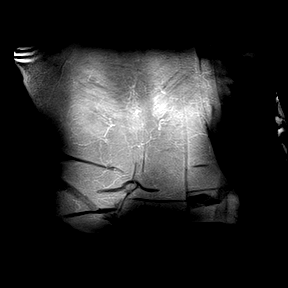

[Series 29: T2 · axial · 6.0mm · 1.56mm/px · 1 of 40 slices shown (2 of 2)]
[im 1/40]
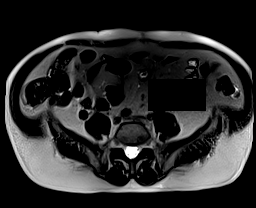

[Series 33: T1 dynamic · axial · 3.0mm · 1.25mm/px · z∈[-125,+160]mm · 3 of 96 slices shown (9 of 10)]
[im 1/96]
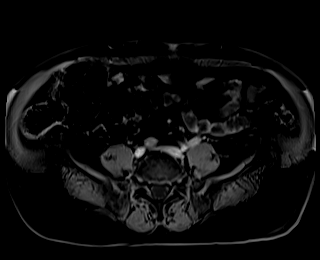
[im 48/96]
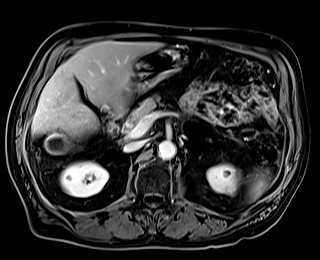
[im 96/96]
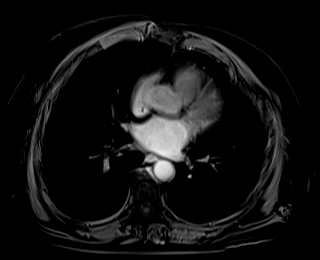

[Series 34: T1 dynamic · axial · 3.0mm · 1.25mm/px · z∈[-125,+160]mm · 3 of 96 slices shown (10 of 10)]
[im 1/96]
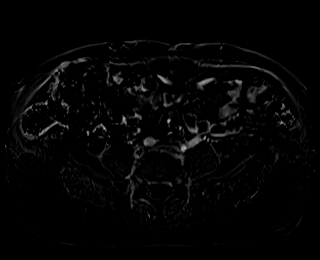
[im 48/96]
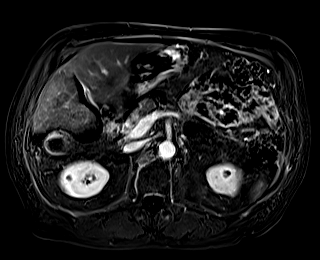
[im 96/96]
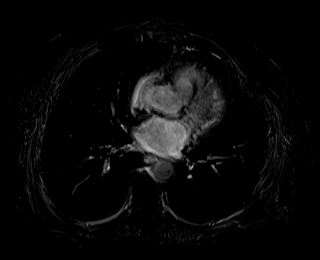

[44 of 48 positions shown; findings below may reference images not displayed]

FINDINGS: Lower chest: No acute findings.

Hepatobiliary: Arterially hyperenhancing subcapsular mass of the
posterior right lobe of the liver, hepatic segment VII is not
significantly changed, measuring 9.9 x 6.1 cm (series 17, image 30),
previously 10.1 x 5.3 cm. Unchanged, very subtly arterially
hyperenhancing lesion of the liver dome measuring 1.1 cm (series 17,
image 22). Redemonstrated, nonocclusive tumor thrombus within the
IVC (series 33, image 29). Gallstone near the gallbladder neck. No
biliary ductal dilatation.

Pancreas: No mass, inflammatory changes, or other parenchymal
abnormality identified.

Spleen:  Within normal limits in size and appearance.

Adrenals/Urinary Tract: No masses identified. No evidence of
hydronephrosis.

Stomach/Bowel: Visualized portions within the abdomen are
unremarkable.

Vascular/Lymphatic: No pathologically enlarged lymph nodes
identified. No abdominal aortic aneurysm demonstrated.

Other:  Trace ascites.

Musculoskeletal: No suspicious bone lesions identified.
IMPRESSION: 1. No significant change in subcapsular mass of the posterior right
lobe of the liver.
2. No significant change in an additional small hyperenhancing
lesion of the liver dome, which remains suspicious for a satellite
lesion.
3. No new liver lesions.
4. Redemonstrated nonocclusive tumor thrombus within the IVC.
5. Cholelithiasis without evidence of acute cholecystitis.
6. Trace ascites.

## 2021-06-23 IMAGING — MR MR ABDOMEN WO/W CM
18 of 21 series · 44 of 48 positions shown · IV contrast (gadavist)
Comparison: [DATE], [DATE]

CLINICAL DATA: Intrahepatic cholangiocarcinoma, assess treatment
response

EXAM:
MRI ABDOMEN WITHOUT AND WITH CONTRAST
TECHNIQUE: Multiplanar multisequence MR imaging of the abdomen was performed
both before and after the administration of intravenous contrast.
CONTRAST:  10mL GADAVIST GADOBUTROL 1 MMOL/ML IV SOLN

[Series 2: DWI · axial · 6.0mm · 1.49mm/px · z∈[-109,+165]mm · 3 of 78 slices shown (1 of 2)]
[im 1/78]
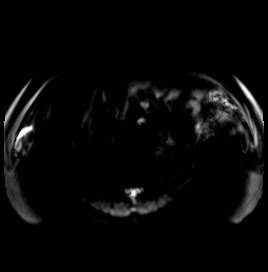
[im 39/78]
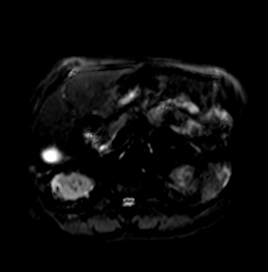
[im 78/78]
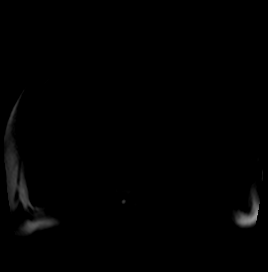

[Series 3: DWI · axial · 6.0mm · 1.49mm/px · 1 of 39 slices shown (2 of 2)]
[im 1/39]
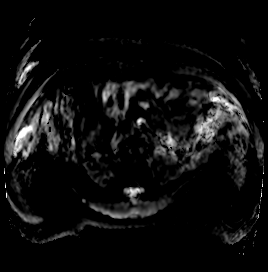

[Series 4: T2 fat-sat · axial · 6.0mm · 1.25mm/px · 1 of 40 slices shown]
[im 1/40]
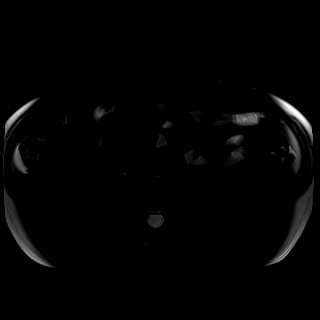

[Series 8: T2 · coronal · 6.0mm · 1.56mm/px · 1 of 45 slices shown (1 of 2)]
[im 1/45]
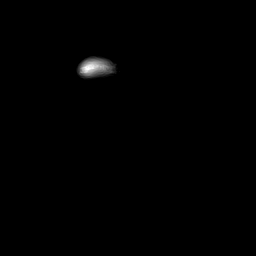

[Series 9: T1 · axial · 3.4mm · 1.25mm/px · z∈[-108,+160]mm · 3 of 80 slices shown (1 of 2)]
[im 1/80]
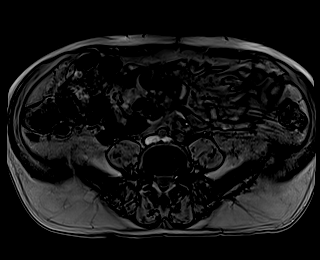
[im 40/80]
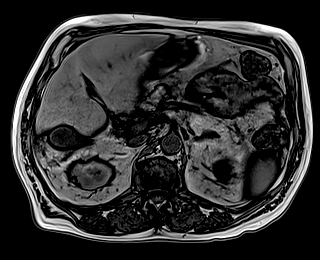
[im 80/80]
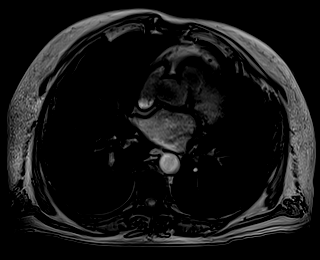

[Series 10: T1 · axial · 3.4mm · 1.25mm/px · z∈[-108,+160]mm · 3 of 80 slices shown (2 of 2)]
[im 1/80]
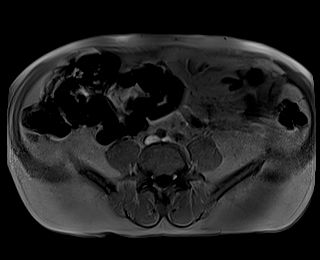
[im 40/80]
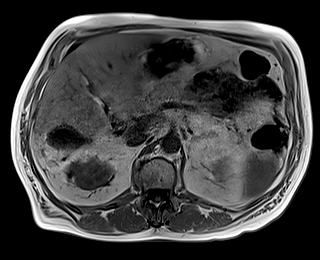
[im 80/80]
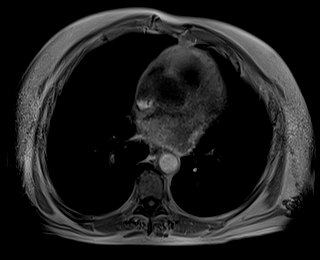

[Series 11: bSSFP · axial · 4.0mm · 0.84mm/px · z∈[-108,+164]mm · 2 of 69 slices shown]
[im 1/69]
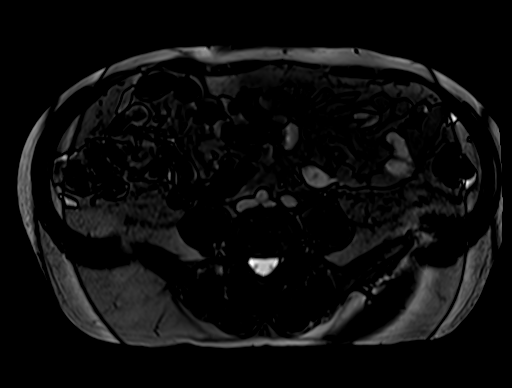
[im 69/69]
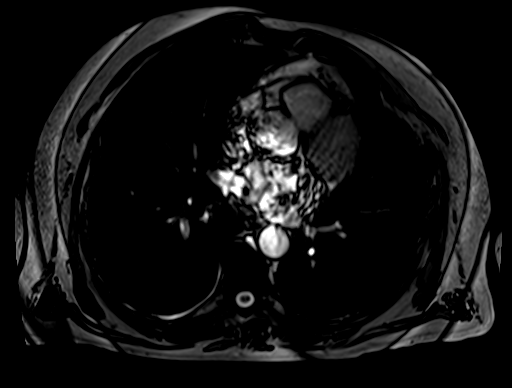

[Series 13: T1 dynamic · axial · 3.0mm · 1.25mm/px · z∈[-125,+160]mm · 3 of 96 slices shown (1 of 10)]
[im 1/96]
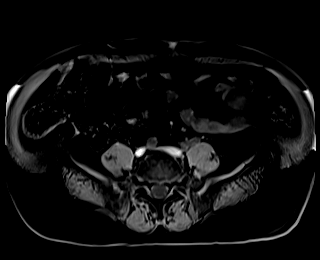
[im 48/96]
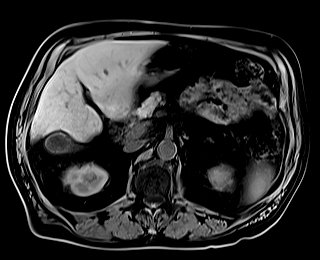
[im 96/96]
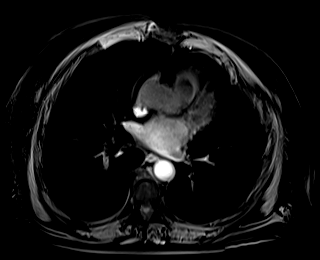

[Series 17: T1 dynamic · axial · 3.0mm · 1.25mm/px · z∈[-125,+160]mm · 3 of 96 slices shown (2 of 10)]
[im 1/96]
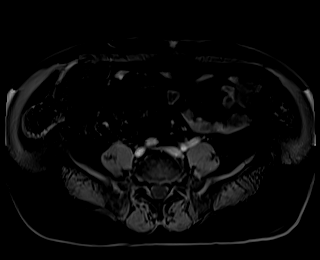
[im 48/96]
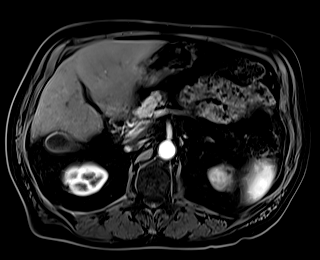
[im 96/96]
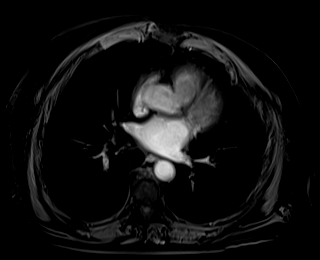

[Series 18: T1 dynamic · axial · 3.0mm · 1.25mm/px · z∈[-125,+160]mm · 3 of 96 slices shown (3 of 10)]
[im 1/96]
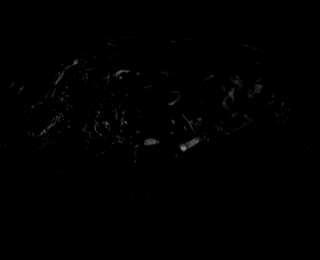
[im 48/96]
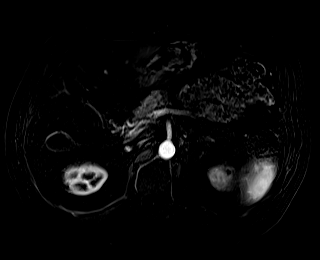
[im 96/96]
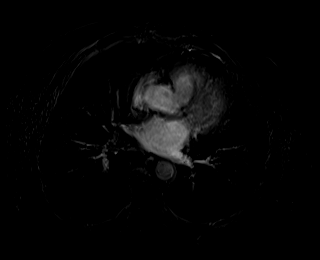

[Series 21: T1 dynamic · axial · 3.0mm · 1.25mm/px · z∈[-125,+160]mm · 3 of 96 slices shown (4 of 10)]
[im 1/96]
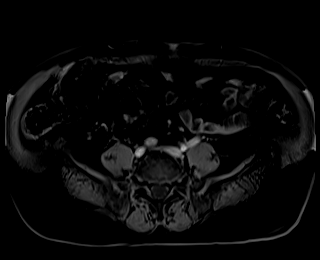
[im 48/96]
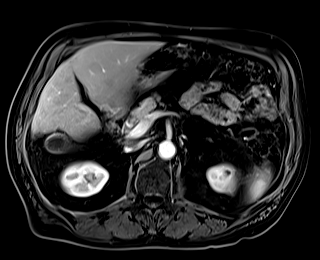
[im 96/96]
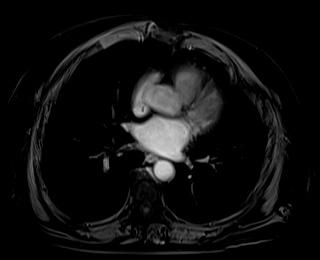

[Series 22: T1 dynamic · axial · 3.0mm · 1.25mm/px · z∈[-125,+160]mm · 3 of 96 slices shown (5 of 10)]
[im 1/96]
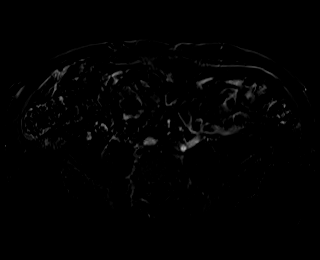
[im 48/96]
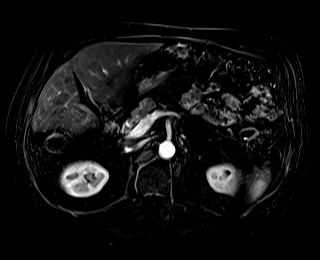
[im 96/96]
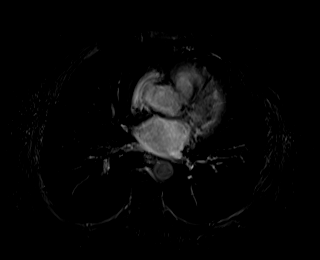

[Series 25: T1 dynamic · axial · 3.0mm · 1.25mm/px · z∈[-125,+160]mm · 3 of 96 slices shown (6 of 10)]
[im 1/96]
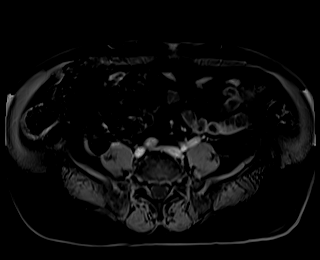
[im 48/96]
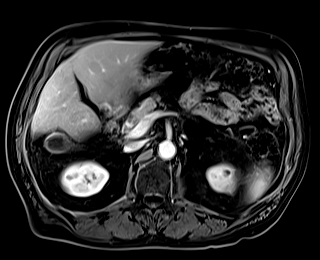
[im 96/96]
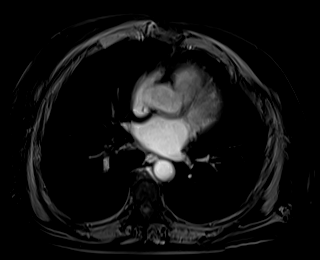

[Series 26: T1 dynamic · axial · 3.0mm · 1.25mm/px · z∈[-125,+160]mm · 3 of 96 slices shown (7 of 10)]
[im 1/96]
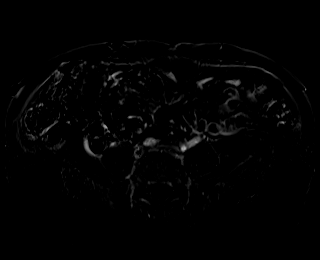
[im 48/96]
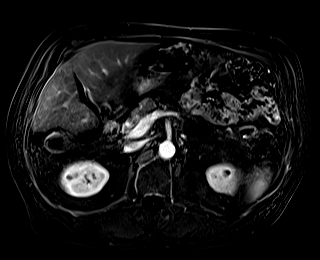
[im 96/96]
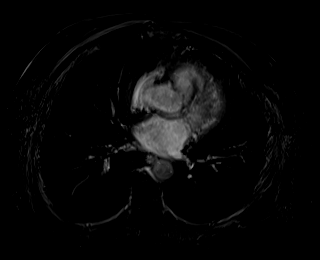

[Series 28: T1 dynamic · coronal · 5.0mm · 1.56mm/px · 2 of 60 slices shown (8 of 10)]
[im 1/60]
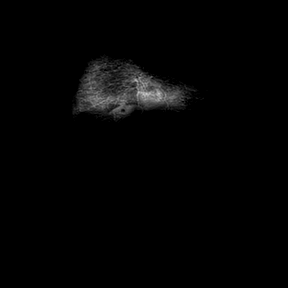
[im 60/60]
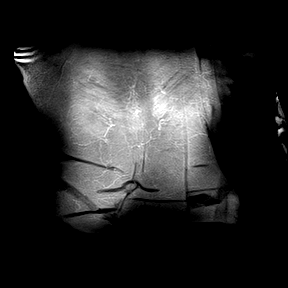

[Series 29: T2 · axial · 6.0mm · 1.56mm/px · 1 of 40 slices shown (2 of 2)]
[im 1/40]
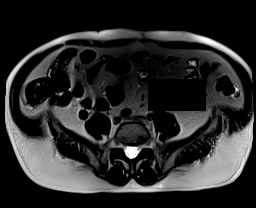

[Series 33: T1 dynamic · axial · 3.0mm · 1.25mm/px · z∈[-125,+160]mm · 3 of 96 slices shown (9 of 10)]
[im 1/96]
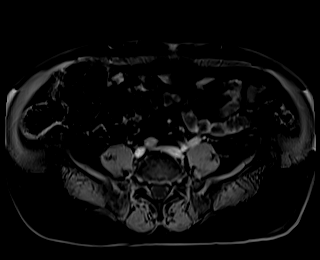
[im 48/96]
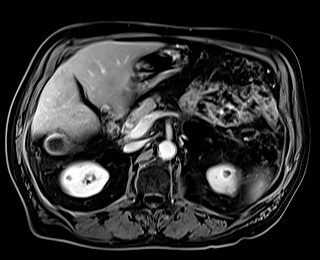
[im 96/96]
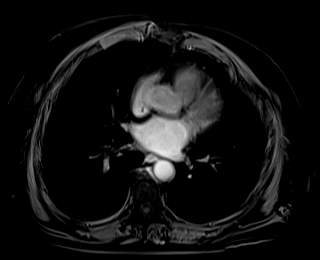

[Series 34: T1 dynamic · axial · 3.0mm · 1.25mm/px · z∈[-125,+160]mm · 3 of 96 slices shown (10 of 10)]
[im 1/96]
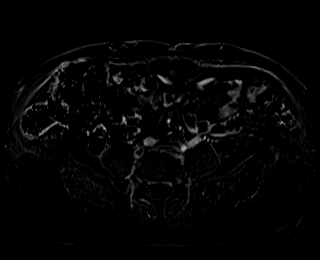
[im 48/96]
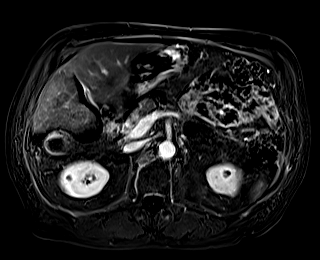
[im 96/96]
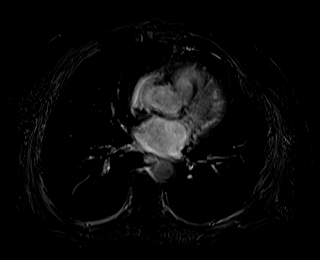

[44 of 48 positions shown; findings below may reference images not displayed]

FINDINGS: Lower chest: No acute findings.

Hepatobiliary: Arterially hyperenhancing subcapsular mass of the
posterior right lobe of the liver, hepatic segment VII is not
significantly changed, measuring 9.9 x 6.1 cm (series 17, image 30),
previously 10.1 x 5.3 cm. Unchanged, very subtly arterially
hyperenhancing lesion of the liver dome measuring 1.1 cm (series 17,
image 22). Redemonstrated, nonocclusive tumor thrombus within the
IVC (series 33, image 29). Gallstone near the gallbladder neck. No
biliary ductal dilatation.

Pancreas: No mass, inflammatory changes, or other parenchymal
abnormality identified.

Spleen:  Within normal limits in size and appearance.

Adrenals/Urinary Tract: No masses identified. No evidence of
hydronephrosis.

Stomach/Bowel: Visualized portions within the abdomen are
unremarkable.

Vascular/Lymphatic: No pathologically enlarged lymph nodes
identified. No abdominal aortic aneurysm demonstrated.

Other:  Trace ascites.

Musculoskeletal: No suspicious bone lesions identified.
IMPRESSION: 1. No significant change in subcapsular mass of the posterior right
lobe of the liver.
2. No significant change in an additional small hyperenhancing
lesion of the liver dome, which remains suspicious for a satellite
lesion.
3. No new liver lesions.
4. Redemonstrated nonocclusive tumor thrombus within the IVC.
5. Cholelithiasis without evidence of acute cholecystitis.
6. Trace ascites.

## 2021-06-23 MED ORDER — GADOBUTROL 1 MMOL/ML IV SOLN
10.0000 mL | Freq: Once | INTRAVENOUS | Status: AC | PRN
  Administered 2021-06-23: 10 mL via INTRAVENOUS

## 2021-06-27 ENCOUNTER — Other Ambulatory Visit: Payer: Self-pay

## 2021-06-27 ENCOUNTER — Inpatient Hospital Stay: Payer: Medicare HMO | Attending: Hematology | Admitting: Hematology

## 2021-06-27 ENCOUNTER — Inpatient Hospital Stay: Payer: Medicare HMO

## 2021-06-27 ENCOUNTER — Encounter: Payer: Self-pay | Admitting: Hematology

## 2021-06-27 VITALS — BP 117/79 | HR 44 | Temp 97.9°F | Resp 17 | Wt 213.5 lb

## 2021-06-27 DIAGNOSIS — Z5112 Encounter for antineoplastic immunotherapy: Secondary | ICD-10-CM | POA: Insufficient documentation

## 2021-06-27 DIAGNOSIS — C221 Intrahepatic bile duct carcinoma: Secondary | ICD-10-CM

## 2021-06-27 DIAGNOSIS — Z5111 Encounter for antineoplastic chemotherapy: Secondary | ICD-10-CM | POA: Diagnosis not present

## 2021-06-27 DIAGNOSIS — Z95828 Presence of other vascular implants and grafts: Secondary | ICD-10-CM

## 2021-06-27 DIAGNOSIS — Z5189 Encounter for other specified aftercare: Secondary | ICD-10-CM | POA: Diagnosis not present

## 2021-06-27 DIAGNOSIS — Z79899 Other long term (current) drug therapy: Secondary | ICD-10-CM | POA: Insufficient documentation

## 2021-06-27 DIAGNOSIS — Z7189 Other specified counseling: Secondary | ICD-10-CM

## 2021-06-27 LAB — CBC WITH DIFFERENTIAL (CANCER CENTER ONLY)
Abs Immature Granulocytes: 0 10*3/uL (ref 0.00–0.07)
Basophils Absolute: 0 10*3/uL (ref 0.0–0.1)
Basophils Relative: 1 %
Eosinophils Absolute: 0 10*3/uL (ref 0.0–0.5)
Eosinophils Relative: 1 %
HCT: 33.7 % — ABNORMAL LOW (ref 39.0–52.0)
Hemoglobin: 11.2 g/dL — ABNORMAL LOW (ref 13.0–17.0)
Immature Granulocytes: 0 %
Lymphocytes Relative: 17 %
Lymphs Abs: 0.5 10*3/uL — ABNORMAL LOW (ref 0.7–4.0)
MCH: 32.7 pg (ref 26.0–34.0)
MCHC: 33.2 g/dL (ref 30.0–36.0)
MCV: 98.5 fL (ref 80.0–100.0)
Monocytes Absolute: 0.7 10*3/uL (ref 0.1–1.0)
Monocytes Relative: 23 %
Neutro Abs: 1.6 10*3/uL — ABNORMAL LOW (ref 1.7–7.7)
Neutrophils Relative %: 58 %
Platelet Count: 112 10*3/uL — ABNORMAL LOW (ref 150–400)
RBC: 3.42 MIL/uL — ABNORMAL LOW (ref 4.22–5.81)
RDW: 18.1 % — ABNORMAL HIGH (ref 11.5–15.5)
WBC Count: 2.9 10*3/uL — ABNORMAL LOW (ref 4.0–10.5)
nRBC: 0 % (ref 0.0–0.2)

## 2021-06-27 LAB — CMP (CANCER CENTER ONLY)
ALT: 20 U/L (ref 0–44)
AST: 29 U/L (ref 15–41)
Albumin: 3.6 g/dL (ref 3.5–5.0)
Alkaline Phosphatase: 64 U/L (ref 38–126)
Anion gap: 10 (ref 5–15)
BUN: 22 mg/dL (ref 8–23)
CO2: 22 mmol/L (ref 22–32)
Calcium: 9.5 mg/dL (ref 8.9–10.3)
Chloride: 108 mmol/L (ref 98–111)
Creatinine: 1.65 mg/dL — ABNORMAL HIGH (ref 0.61–1.24)
GFR, Estimated: 44 mL/min — ABNORMAL LOW (ref >60)
Glucose, Bld: 155 mg/dL — ABNORMAL HIGH (ref 70–99)
Potassium: 3.9 mmol/L (ref 3.5–5.1)
Sodium: 140 mmol/L (ref 135–145)
Total Bilirubin: 0.4 mg/dL (ref 0.3–1.2)
Total Protein: 7 g/dL (ref 6.5–8.1)

## 2021-06-27 LAB — TSH: TSH: 3.323 u[IU]/mL (ref 0.320–4.118)

## 2021-06-27 LAB — MAGNESIUM: Magnesium: 1.8 mg/dL (ref 1.7–2.4)

## 2021-06-27 MED ORDER — PALONOSETRON HCL INJECTION 0.25 MG/5ML
INTRAVENOUS | Status: AC
  Filled 2021-06-27: qty 5

## 2021-06-27 MED ORDER — SODIUM CHLORIDE 0.9% FLUSH
10.0000 mL | Freq: Once | INTRAVENOUS | Status: AC
  Administered 2021-06-27: 10 mL
  Filled 2021-06-27: qty 10

## 2021-06-27 MED ORDER — DEXTROSE 5 % IV SOLN
Freq: Once | INTRAVENOUS | Status: AC
  Filled 2021-06-27: qty 250

## 2021-06-27 MED ORDER — OXALIPLATIN CHEMO INJECTION 100 MG/20ML
75.0000 mg/m2 | Freq: Once | INTRAVENOUS | Status: AC
  Administered 2021-06-27: 165 mg via INTRAVENOUS
  Filled 2021-06-27: qty 33

## 2021-06-27 MED ORDER — SODIUM CHLORIDE 0.9 % IV SOLN
5000.0000 mg | INTRAVENOUS | Status: DC
  Administered 2021-06-27: 5000 mg via INTRAVENOUS
  Filled 2021-06-27: qty 100

## 2021-06-27 MED ORDER — PALONOSETRON HCL INJECTION 0.25 MG/5ML
0.2500 mg | Freq: Once | INTRAVENOUS | Status: AC
  Administered 2021-06-27: 0.25 mg via INTRAVENOUS

## 2021-06-27 MED ORDER — LEUCOVORIN CALCIUM INJECTION 350 MG
400.0000 mg/m2 | Freq: Once | INTRAVENOUS | Status: AC
  Administered 2021-06-27: 880 mg via INTRAVENOUS
  Filled 2021-06-27: qty 44

## 2021-06-27 MED ORDER — SODIUM CHLORIDE 0.9 % IV SOLN: 2400.0000 mg/m2 | INTRAVENOUS | Status: DC

## 2021-06-27 MED ORDER — SODIUM CHLORIDE 0.9 % IV SOLN
1500.0000 mg | Freq: Once | INTRAVENOUS | Status: AC
  Administered 2021-06-27: 1500 mg via INTRAVENOUS
  Filled 2021-06-27: qty 30

## 2021-06-27 NOTE — Progress Notes (Signed)
[facility]   [phone] [phone]   Clinic Follow up Note   Patient Care Team: [name], PA as PCP - General (Family Medicine) Lorretta Harp, MD as PCP - Cardiology (Cardiology) [name], MD as Consulting Physician (General Surgery) [name], [name], MD as Consulting Physician (Gastroenterology) [name], MD as Consulting Physician (Hematology) Lorretta Harp, MD as Consulting Physician (Cardiology)  Date of Service:  06/27/2021  CHIEF COMPLAINT: f/u of cholangiocarcinoma  SUMMARY OF ONCOLOGIC HISTORY: Oncology History Overview Note  Cancer Staging Intrahepatic cholangiocarcinoma ([place]) Staging form: Intrahepatic Bile Duct, AJCC 8th Edition - Clinical stage from 08/19/2019: Stage II (cT2, cN0, cM0) - Signed by [name], MD on 08/19/2019     Intrahepatic cholangiocarcinoma ([place])  06/28/2019 Imaging   CT AP W Contrast 06/28/19  IMPRESSION: 1. Heterogeneous hypodensity posteriorly in the right hepatic lobe and potentially extending into the caudate lobe suspicious for a mass. There is felt to be truncation of branches of the portal vein in this vicinity and some narrowing of the hepatic vein, as well as triangular-shaped regions of abnormal hypoenhancement posteriorly in the right hepatic lobe likely representing downstream vascular effects. Cannot exclude malignancy such as cholangiocarcinoma or hepatocellular carcinoma, and follow up hepatic protocol MRI with and without contrast is recommended to further characterize. 2. 4 mm right middle lobe pulmonary nodule is likely benign but may merit surveillance. 3. Cholelithiasis. 4.  Aortic Atherosclerosis (ICD10-I70.0). 5. Prostatomegaly. 6. Mild impingement at L3-4 and L4-5.   07/31/2019 Imaging   MRI Liver 07/31/19 IMPRESSION: 1. 7.3 cm in long axis mass in the right hepatic lobe spanning into the caudate lobe, high suspicion for malignancy such as hepatocellular carcinoma  or cholangiocarcinoma. Suspected effacement or occlusion of the right hepatic vein and posterior branches of the right portal vein. Two smaller tumor nodules along the posterior periphery of the dominant mass. Tissue diagnosis is recommended. 2. No findings of pathologic adenopathy or distant metastatic spread. 3. 9 mm gallstone in the gallbladder. There is mild gallbladder wall thickening which may be from nondistention, correlate clinically in assessing for cholecystitis. 4.  Aortic Atherosclerosis (ICD10-I70.0). 5. Mild diffuse hepatic steatosis.   08/11/2019 Initial Biopsy   DIAGNOSIS: 08/11/19  A. LIVER, RIGHT, BIOPSY:  - Adenocarcinoma.   08/18/2019 Imaging   CT Chest 08/18/19  IMPRESSION: 1. Multiple pulmonary nodules largest at approximately 7 mm in the right lower lobe, nonspecific but concerning given findings in the liver. 2. No signs of definitive metastatic disease, also with mildly enlarged upper abdominal lymph nodes as discussed.   Aortic Atherosclerosis (ICD10-I70.0).   08/19/2019 Initial Diagnosis   Intrahepatic cholangiocarcinoma ([place])    08/19/2019 Cancer Staging   Staging form: Intrahepatic Bile Duct, AJCC 8th Edition - Clinical stage from 08/19/2019: Stage II (cT2, cN0, cM0) - Signed by [name], MD on 08/19/2019    09/29/2019 - 07/19/2020 Chemotherapy   Cisplatin and Gemcitabine 2 weeks on/1 week off starting 09/29/19. Cisplatin held from cycle 9 (03/23/20) due to fluid status/Afib. He is now on maintenance Gemcitabine. Held after 07/19/20 to proceed with liver target therapy.     10/09/2019 Imaging   CT AP IMPRESSION: 1. The dominant right hepatic lobe mass is minimally reduced in size compared to prior exams, currently measuring 6.2 by 5.3 cm, previously 6.2 by 5.6 cm. However, there is a new small hypodense lesion centrally in the right hepatic lobe which is suspicious for a new small focus of tumor. Accordingly this is an  overall mixed appearance. 2.  Continued hypoenhancement in the liver downstream of the tumor likely attributable to narrowing or occlusion of the right hepatic vein by the tumor. By virtue of its location the tumor wraps around the intrahepatic portion of the IVC. 3. 4 mm right middle lobe pulmonary nodule, stable compared to earliest available comparison of 07/18/2019. Surveillance of the patient's pulmonary nodules is recommended. 4. Other imaging findings of potential clinical significance: Coronary atherosclerosis. Cholelithiasis. Prominent stool throughout the colon favors constipation. Moderate prostatomegaly with heterogeneous enhancement of the prostate gland. Lumbar spondylosis and degenerative disc disease causing mild bilateral foraminal impingement at L3-4 and L4-5.   Aortic Atherosclerosis (ICD10-I70.0).   12/24/2019 Imaging   CT CAP W Contrast  IMPRESSION: 1. Right hepatic lobe mass and adjacent right hepatic lobe nodules appear grossly stable. No evidence of distant metastatic disease. 2. Continued stability of small pulmonary nodules. Recommend attention on follow-up. 3. Cholelithiasis. 4. Enlarged prostate. 5. Aortic atherosclerosis (ICD10-I70.0). Coronary artery calcification.   02/23/2020 Procedure   He had PAC placed on 02/23/20.    04/08/2020 Imaging   CT CAP  IMPRESSION: 1. Stable or minimally decreased size of a very ill-defined, hypodense and somewhat retractile appearing mass of the central right lobe of the liver abutting the inferior vena cava and right portal vein, measuring approximately 5.7 x 5.0 cm, previously 6.2 x 5.2 cm when measured similarly. Findings are consistent with stable or minimally improved cholangiocarcinoma. 2. Small hypodense nodules of the right lobe identified on prior examination are poorly appreciated on this single phase contrast examination although not grossly changed. Attention on follow-up. 3. There are new, moderate bilateral pleural effusions and associated  atelectasis or consolidation as well as a new small pericardial effusion, nonspecific although generally concerning and suspicious for malignant effusions. There is no directly visualized pleural nodularity. 4. Multiple small pulmonary nodules are stable.  No new nodules. 5. Coronary artery disease. Aortic Atherosclerosis (ICD10-I70.0).   07/06/2020 Imaging   MRI ABD IMPRESSION: 1. Interval decrease in size of the right hepatic lobe lesion in the medial aspect of the segment 7. Findings would suggest a good response to treatment with some contraction of the tumor. 2. No new hepatic lesions. No abdominal adenopathy or metastatic disease. 3. Stable mild intrahepatic biliary dilatation in the right hepatic lobe distal to the lesion. 4. Somewhat tortuous and almost beaded appearance of the hepatic and portal vein radicles. Findings could be radiation related. 5. Cholelithiasis.  CT chest wo contrast IMPRESSION: 1. Persistent/stable moderate-sized bilateral pleural effusions with overlying atelectasis. 2. Stable small right pulmonary nodules. No new or progressive findings. Recommend continued surveillance. 3. No mediastinal or hilar mass or adenopathy. 4. Stable advanced three-vessel coronary artery calcifications. 5. Cholelithiasis. Aortic Atherosclerosis (ICD10-I70.0)   08/11/2020 Procedure   Y90 on 08/11/20 and 08/26/20 with Dr Laurence Ferrari    11/30/2020 Imaging   MRI Abdomen  IMPRESSION: 1. Today's study demonstrates progression of disease with enlarging central lesion in the right lobe of the liver involving portions of segments 7 and 8, now with evidence of some tumor thrombus extension into the intrahepatic portion of the inferior vena cava. This is associated with increasing intrahepatic biliary ductal dilatation in segment 7 of the liver. 2. In addition, there is some subtle hyperenhancement of the distal common bile duct at and immediately proximally to the level of the ampulla.  There is also some subtle delayed enhancement around this region in the pancreatic head. This is of uncertain etiology and significance, but may  be inflammatory and currently is not associated with proximal common bile duct dilatation. However, close attention on follow-up imaging is recommended. 3. Cholelithiasis without evidence of acute cholecystitis at this time. 4. Aortic atherosclerosis.     12/24/2020 - 03/21/2021 Chemotherapy   Restart Gemcitabine 2 weeks on/1 week off given disease progression beginning 12/24/20. Discontinued after 03/21/21 due to disease progression in liver.     03/16/2021 Imaging   MRI abdomen  IMPRESSION: No significant change in size of irregular hypovascular mass in the medial right hepatic lobe.   New 1.1 cm hypovascular lesion with peripheral rim enhancement in liver dome, suspicious for metastatic disease.   New small right pleural effusion and mild ascites.   Cholelithiasis, without evidence of cholecystitis or biliary dilatation.   04/05/2021 -  Chemotherapy   Second-line FOLFOX q2weeks starting 04/05/21    04/05/2021 -  Chemotherapy   Immunotherapy with Durvalumab (Imfinzi) q4weeks starting 04/05/21. (in addition to second line FOLFOX)   06/23/2021 Imaging   MRI Abdomen IMPRESSION: 1. No significant change in subcapsular mass of the posterior right lobe of the liver. 2. No significant change in an additional small hyperenhancing lesion of the liver dome, which remains suspicious for a satellite lesion. 3. No new liver lesions. 4. 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 5. Cholelithiasis without evidence of acute cholecystitis. 6. Trace ascites.  CT Chest w/o contrast IMPRESSION: 1. Occasional small pulmonary nodules in the right lung are stable, for example a 6 mm nodule right lower lobe and a 3 mm nodule of the lateral segment right middle lobe. These are nonspecific and likely benign, incidental sequelae of infection or  inflammation, metastatic disease not favored. Attention on follow-up. 2. Previously seen bilateral pleural effusions are resolved. 3. Hypodense lesion of the posterior right lobe of the liver, keeping with known cholangiocarcinoma and better characterized by same day MR. 4. Coronary artery disease.      CURRENT THERAPY:  Second-line FOLFOX q2weeks starting 04/05/21 Additional Durvalumab (Imfinzi) q4weeks starting 04/05/21.  INTERVAL HISTORY:  ODIS TURCK is here for a follow up of cholangiocarcinoma. He was last seen by me on 05/18/21 and by NP Lacie in the interim. He presents to the clinic accompanied by his wife. He reports his cold sensitivity lasts about 3-4 days. He notes his appetite is okay, but he eats anyway.   All other systems were reviewed with the patient and are negative.  MEDICAL HISTORY:  Past Medical History:  Diagnosis Date   Arthritis    Diabetes ([place])    GERD (gastroesophageal reflux disease)    Hyperlipidemia    Hypertension    Intrahepatic cholangiocarcinoma ([place])     SURGICAL HISTORY: Past Surgical History:  Procedure Laterality Date   APPENDECTOMY  1980   CARDIOVERSION N/A 04/27/2020   Procedure: CARDIOVERSION;  Surgeon: Dorothy Spark, MD;  Location: [facility];  Service: Cardiovascular;  Laterality: N/A;   CARDIOVERSION N/A 05/19/2020   Procedure: CARDIOVERSION;  Surgeon: Elouise Munroe, MD;  Location: [facility];  Service: Cardiovascular;  Laterality: N/A;   COLONOSCOPY     IR 3D INDEPENDENT WKST  08/11/2020   IR 3D INDEPENDENT WKST  08/11/2020   IR ANGIOGRAM SELECTIVE EACH ADDITIONAL VESSEL  08/11/2020   IR ANGIOGRAM SELECTIVE EACH ADDITIONAL VESSEL  08/11/2020   IR ANGIOGRAM SELECTIVE EACH ADDITIONAL VESSEL  08/11/2020   IR ANGIOGRAM SELECTIVE EACH ADDITIONAL VESSEL  08/11/2020   IR ANGIOGRAM SELECTIVE EACH ADDITIONAL VESSEL  08/11/2020   IR ANGIOGRAM SELECTIVE EACH ADDITIONAL  VESSEL  08/11/2020   IR ANGIOGRAM SELECTIVE EACH ADDITIONAL  VESSEL  08/26/2020   IR ANGIOGRAM SELECTIVE EACH ADDITIONAL VESSEL  08/26/2020   IR ANGIOGRAM VISCERAL SELECTIVE  08/11/2020   IR ANGIOGRAM VISCERAL SELECTIVE  08/26/2020   IR EMBO ARTERIAL NOT HEMORR HEMANG INC GUIDE ROADMAPPING  08/11/2020   IR EMBO TUMOR ORGAN ISCHEMIA INFARCT INC GUIDE ROADMAPPING  08/26/2020   IR IMAGING GUIDED PORT INSERTION  02/23/2020   IR RADIOLOGIST EVAL & MGMT  07/14/2020   IR RADIOLOGIST EVAL & MGMT  09/30/2020   IR RADIOLOGIST EVAL & MGMT  12/01/2020   IR US GUIDE VASC ACCESS LEFT  08/26/2020   IR US GUIDE VASC ACCESS RIGHT  08/11/2020    I have reviewed the social history and family history with the patient and they are unchanged from previous note.  ALLERGIES:  has No Known Allergies.  MEDICATIONS:  Current Outpatient Medications  Medication Sig Dispense Refill   allopurinol (ZYLOPRIM) 100 MG tablet Take 100 mg by mouth daily.     colchicine 0.6 MG tablet Take 1 tablet (0.6 mg total) by mouth daily as needed (gout flare). Take daily for 3 days, then as needed for gout flare 30 tablet 0   diltiazem (CARDIZEM CD) 240 MG 24 hr capsule TAKE 1 CAPSULE BY MOUTH EVERY DAY, STOP AMLODIPINE 90 capsule 3   ELIQUIS 5 MG TABS tablet TAKE 1 TABLET TWICE DAILY 180 tablet 1   fenofibrate (TRICOR) 145 MG tablet Take 145 mg by mouth daily.     finasteride (PROSCAR) 5 MG tablet Take 5 mg by mouth daily.     furosemide (LASIX) 40 MG tablet Take 40 mg twice a day 90 tablet 3   gabapentin (NEURONTIN) 100 MG capsule Take 100 mg by mouth at bedtime.     hydrALAZINE (APRESOLINE) 50 MG tablet TAKE 2 TABLETS BY MOUTH IN THE MORNING, 1 TABLET AT LUNCH AND 2 IN THE EVENING 450 tablet 1   hydrALAZINE (APRESOLINE) 50 MG tablet Take 50 mg by mouth 2 (two) times daily.     KLOR-CON M20 20 MEQ tablet TAKE 1 TABLET BY MOUTH TWICE A DAY 180 tablet 3   lidocaine-prilocaine (EMLA) cream Apply 1 application topically as needed. 30 g 3   metoprolol succinate (TOPROL XL) 25 MG 24 hr tablet Take 1 tablet  (25 mg total) by mouth daily. 90 tablet 3   metoprolol tartrate (LOPRESSOR) 50 MG tablet      pantoprazole (PROTONIX) 40 MG tablet TAKE 1 TABLET BY MOUTH TWICE A DAY 180 tablet 2   pravastatin (PRAVACHOL) 40 MG tablet Take 40 mg by mouth daily.     sucralfate (CARAFATE) 1 g tablet Take 1 tablet (1 g total) by mouth every 6 (six) hours as needed. Please schedule a yearly follow up for further refills: [phone] 60 tablet 0   tamsulosin (FLOMAX) 0.4 MG CAPS capsule Take 0.4 mg by mouth daily.     No current facility-administered medications for this visit.    PHYSICAL EXAMINATION: ECOG PERFORMANCE STATUS: 1 - Symptomatic but completely ambulatory  Vitals:   06/27/21 0928  BP: 117/79  Pulse: (!) 44  Resp: 17  Temp: 97.9 F (36.6 C)  SpO2: 99%   Wt Readings from Last 3 Encounters:  06/27/21 213 lb 8 oz (96.8 kg)  06/14/21 217 lb (98.4 kg)  06/13/21 217 lb (98.4 kg)     GENERAL:alert, no distress and comfortable SKIN: skin color normal, no rashes or significant lesions EYES: normal,  Conjunctiva are pink and non-injected, sclera clear  NEURO: alert & oriented x 3 with fluent speech  LABORATORY DATA:  I have reviewed the data as listed CBC Latest Ref Rng & Units 06/27/2021 06/13/2021 06/08/2021  WBC 4.0 - 10.5 K/uL 2.9(L) 3.3(L) 2.8(L)  Hemoglobin 13.0 - 17.0 g/dL 11.2(L) 11.2(L) 11.3(L)  Hematocrit 39.0 - 52.0 % 33.7(L) 33.3(L) 33.7(L)  Platelets 150 - 400 K/uL 112(L) 119(L) 111(L)     CMP Latest Ref Rng & Units 06/27/2021 06/13/2021 06/08/2021  Glucose 70 - 99 mg/dL 155(H) 193(H) 106(H)  BUN 8 - 23 mg/dL _0 Creatinine 0.61 - 1.24 mg/dL 1.65(H) 1.73(H) 1.69(H)  Sodium 135 - 145 mmol/L 140 137 139  Potassium 3.5 - 5.1 mmol/L 3.9 4.1 3.8  Chloride 98 - 111 mmol/L 108 104 105  CO2 22 - 32 mmol/L _1 Calcium 8.9 - 10.3 mg/dL 9.5 9.4 9.2  Total Protein 6.5 - 8.1 g/dL 7.0 7.2 7.3  Total Bilirubin 0.3 - 1.2 mg/dL 0.4 0.4 0.6  Alkaline Phos 38 - 126 U/L 64 62 62  AST  15 - 41 U/L 29 44(H) 36  ALT 0 - 44 U/L _2 RADIOGRAPHIC STUDIES: I have personally reviewed the radiological images as listed and agreed with the findings in the report. No results found.   ASSESSMENT & PLAN:  Brandon Sherman is a 75 y.o. male with   1. Intrahepatic cholangiocarcinoma, cT2N0M0, stage II, unresectable, with indeterminate lung nodules  -He was diagnosed in 07/2019. CT scans and MRI liver show a large 7.3cm mass in the right hepatic lobe which abuts portal vein. -He was seen by our local surgeon Dr. Barry Dienes and Dr Carlis Abbott at [facility], both concluded that cancer is not resectable due to the invasion to portal vein. Given cancer is non-resectable his cancer is likely not curable but still treatable. -He was treated with standard first line chemo with IV Cisplatin and Gemcitabine 2 weeks on/1 week off beginning 09/29/19. Cisplatin was held since C9 (03/23/20) due to fluid status/AFib. He continued maintenance Gemcitabine until 07/19/20. He proceeded with Y90 on 08/11/20 and 08/26/20. -His MRI Abdomen from 12/11/20 showed progression of disease in liver with tumor thrombus extension into IVC. Given disease progression, Dr Geroge Baseman does not recommend more Y90 or liver target therapy at this point.  -To control his disease I restarted Single agent Gemcitabine 2 weeks on/1 week off beginning 12/24/20.  -He has IDH-1 mutation, which makes him eligible for an oral IDH inhibitor drug Ivosidenib.  -Based on 03/16/21 MRI he progressed with New 1.1 cm hypovascular lesion suspicious for metastatic disease. -He is not a candidate for cisplatin due to his CKD and CHF. I changed him to second-line chemo with FOLFOX q2weeks and added immunotherapy Durvalumab (Imfinzi) q3-4weeks starting 04/05/21. Goal of chemo is to control his disease and prolong his life.  -Restaging CT chest and MRI abd on 06/23/21 showed stable disease in liver, small and stable lung nodules, no new lesions. I reviewed the images and  compared to his previous scan with them today. Given stable disease, I will continue current therapy -S/p C6 he continues tolerating current regimen well overall. Labs reviewed, CBC showed decreased WBC. Overall adequate to proceed with Imfinzi and C7 FOLFOX today. I will add fulphila today due to his decreasing blood counts. -Continue with same dosage. No prohibitive toxicities. -Continue FOLFOX q2weeks and Imfinzi q4weeks. -F/u in 2 weeks.     2.  DM, HTN, HLD, Gout, LE edema -On Metformin, amlodipine, lisinopril., allopurinol, Lasix. Continue to f/u with his PCP and cardiologist. He is now on Lasix three days a week. -His PCP has started him on Janumet for his feet burning.    3. AF and CHF -With PAC placement on 02/23/20 he was found to have Afib. Based on CHADS2 Score 2. He is at moderate risk for stroke.  -He underwent Cardioversion with Dr. Meda [facility] on 04/27/20. After CHF in late 04/2020 he had another cardioversion on 05/19/20. He has recovered well.  -His Afib is intermittent. He will continue his medications and f/u with cardiologist. He is on Eliquis    4. Nicotine Use -He never smoked but has been chewing Tobacco for the past 60 years. He no longer drinks alcohol.  -He currently chews 1-2 times a day. I have repeatedly discussed and encouraged tobacco cessation.    5. GERD and gastritis, history of esophageal candidiasis, DM  -He had repeated EGD with Dr. Havery Moros in 07/2019. 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 -Will monitor and may repeat EGD in future  -He is not currently on DM medication. I encouraged him to watch his diet and reduce sugar.      PLAN:  -Proceed with C7 FOLFOX today with Fulphila on day 3 if it's approved. -Lab, flush, f/u, and FOLFOX with Imfinzi in 2 weeks.    No problem-specific Assessment & Plan notes found for this encounter.   No orders of the defined  types were placed in this encounter.  All questions were answered. The patient knows to call the clinic with any problems, questions or concerns. No barriers to learning was detected. The total time spent in the appointment was 30 minutes.     [name], MD 06/27/2021   I, Wilburn Mylar, am acting as scribe for [name], MD.   I have reviewed the above documentation for accuracy and completeness, and I agree with the above.

## 2021-06-27 NOTE — Patient Instructions (Signed)
[facility]  Discharge Instructions: Thank you for choosing [facility] to provide your oncology and hematology care.   If you have a lab appointment with the [facility], please go directly to the [facility] and check in at the registration area.   Wear comfortable clothing and clothing appropriate for easy access to any Portacath or PICC line.   We strive to give you quality time with your provider. You may need to reschedule your appointment if you arrive late (15 or more minutes).  Arriving late affects you and other patients whose appointments are after yours.  Also, if you miss three or more appointments without notifying the office, you may be dismissed from the clinic at the provider's discretion.      For prescription refill requests, have your pharmacy contact our office and allow 72 hours for refills to be completed.    Today you received the following chemotherapy and/or immunotherapy agents Durvalumab, Oxaliplatin, Leucovorin, and 46hr 5FU.      To help prevent nausea and vomiting after your treatment, we encourage you to take your nausea medication as directed.  BELOW ARE SYMPTOMS THAT SHOULD BE REPORTED IMMEDIATELY: *FEVER GREATER THAN 100.4 F (38 C) OR HIGHER *CHILLS OR SWEATING *NAUSEA AND VOMITING THAT IS NOT CONTROLLED WITH YOUR NAUSEA MEDICATION *UNUSUAL SHORTNESS OF BREATH *UNUSUAL BRUISING OR BLEEDING *URINARY PROBLEMS (pain or burning when urinating, or frequent urination) *BOWEL PROBLEMS (unusual diarrhea, constipation, pain near the anus) TENDERNESS IN MOUTH AND THROAT WITH OR WITHOUT PRESENCE OF ULCERS (sore throat, sores in mouth, or a toothache) UNUSUAL RASH, SWELLING OR PAIN  UNUSUAL VAGINAL DISCHARGE OR ITCHING   Items with * indicate a potential emergency and should be followed up as soon as possible or go to the Emergency Department if any problems should occur.  Please show the CHEMOTHERAPY ALERT CARD or  IMMUNOTHERAPY ALERT CARD at check-in to the Emergency Department and triage nurse.  Should you have questions after your visit or need to cancel or reschedule your appointment, please contact [facility]  Dept: [phone]  and follow the prompts.  Office hours are 8:00 a.m. to 4:30 p.m. Monday - Friday. Please note that voicemails left after 4:00 p.m. may not be returned until the following business day.  We are closed weekends and major holidays. You have access to a nurse at all times for urgent questions. Please call the main number to the clinic Dept: [phone] and follow the prompts.   For any non-urgent questions, you may also contact your provider using MyChart. We now offer e-Visits for anyone 59 and older to request care online for non-urgent symptoms. For details visit [email or web address].   Also download the MyChart app! Go to the app store, search "MyChart", open the app, select [facility], and log in with your MyChart username and password.  Due to Covid, a mask is required upon entering the hospital/clinic. If you do not have a mask, one will be given to you upon arrival. For doctor visits, patients may have 1 support person aged 85 or older with them. For treatment visits, patients cannot have anyone with them due to current Covid guidelines and our immunocompromised population.

## 2021-06-28 LAB — T4: T4, Total: 11.1 ug/dL (ref 4.5–12.0)

## 2021-06-28 NOTE — Progress Notes (Signed)
Per Dr. Burr Medico, "OK To Treat with a Cr+ of 1.6".

## 2021-06-29 ENCOUNTER — Other Ambulatory Visit: Payer: Self-pay

## 2021-06-29 ENCOUNTER — Inpatient Hospital Stay: Payer: Medicare HMO

## 2021-06-29 VITALS — BP 138/78 | HR 52 | Temp 98.7°F | Resp 20

## 2021-06-29 DIAGNOSIS — C221 Intrahepatic bile duct carcinoma: Secondary | ICD-10-CM

## 2021-06-29 DIAGNOSIS — Z5112 Encounter for antineoplastic immunotherapy: Secondary | ICD-10-CM | POA: Diagnosis not present

## 2021-06-29 DIAGNOSIS — Z7189 Other specified counseling: Secondary | ICD-10-CM

## 2021-06-29 DIAGNOSIS — Z5111 Encounter for antineoplastic chemotherapy: Secondary | ICD-10-CM | POA: Diagnosis not present

## 2021-06-29 DIAGNOSIS — Z79899 Other long term (current) drug therapy: Secondary | ICD-10-CM | POA: Diagnosis not present

## 2021-06-29 DIAGNOSIS — Z5189 Encounter for other specified aftercare: Secondary | ICD-10-CM | POA: Diagnosis not present

## 2021-06-29 MED ORDER — HEPARIN SOD (PORK) LOCK FLUSH 100 UNIT/ML IV SOLN
500.0000 [IU] | Freq: Once | INTRAVENOUS | Status: AC | PRN
  Administered 2021-06-29: 500 [IU]
  Filled 2021-06-29: qty 5

## 2021-06-29 MED ORDER — PEGFILGRASTIM-JMDB 6 MG/0.6ML ~~LOC~~ SOSY
6.0000 mg | PREFILLED_SYRINGE | Freq: Once | SUBCUTANEOUS | Status: AC
  Administered 2021-06-29: 6 mg via SUBCUTANEOUS
  Filled 2021-06-29: qty 0.6

## 2021-06-29 MED ORDER — SODIUM CHLORIDE 0.9% FLUSH
10.0000 mL | INTRAVENOUS | Status: DC | PRN
  Administered 2021-06-29: 10 mL
  Filled 2021-06-29: qty 10

## 2021-06-29 NOTE — Patient Instructions (Signed)

## 2021-06-30 DIAGNOSIS — E119 Type 2 diabetes mellitus without complications: Secondary | ICD-10-CM | POA: Diagnosis not present

## 2021-06-30 DIAGNOSIS — Z0100 Encounter for examination of eyes and vision without abnormal findings: Secondary | ICD-10-CM | POA: Diagnosis not present

## 2021-07-06 DIAGNOSIS — C221 Intrahepatic bile duct carcinoma: Secondary | ICD-10-CM | POA: Diagnosis not present

## 2021-07-11 ENCOUNTER — Inpatient Hospital Stay: Payer: Medicare HMO | Admitting: Hematology

## 2021-07-11 ENCOUNTER — Inpatient Hospital Stay: Payer: Medicare HMO

## 2021-07-11 ENCOUNTER — Encounter: Payer: Self-pay | Admitting: Hematology

## 2021-07-11 ENCOUNTER — Other Ambulatory Visit: Payer: Self-pay

## 2021-07-11 VITALS — BP 135/78 | HR 78 | Temp 98.5°F | Resp 19 | Ht 69.0 in | Wt 216.5 lb

## 2021-07-11 DIAGNOSIS — C221 Intrahepatic bile duct carcinoma: Secondary | ICD-10-CM

## 2021-07-11 DIAGNOSIS — Z7189 Other specified counseling: Secondary | ICD-10-CM

## 2021-07-11 DIAGNOSIS — Z95828 Presence of other vascular implants and grafts: Secondary | ICD-10-CM

## 2021-07-11 DIAGNOSIS — Z5112 Encounter for antineoplastic immunotherapy: Secondary | ICD-10-CM | POA: Diagnosis not present

## 2021-07-11 DIAGNOSIS — Z5189 Encounter for other specified aftercare: Secondary | ICD-10-CM | POA: Diagnosis not present

## 2021-07-11 DIAGNOSIS — Z79899 Other long term (current) drug therapy: Secondary | ICD-10-CM | POA: Diagnosis not present

## 2021-07-11 DIAGNOSIS — Z5111 Encounter for antineoplastic chemotherapy: Secondary | ICD-10-CM | POA: Diagnosis not present

## 2021-07-11 LAB — CBC WITH DIFFERENTIAL (CANCER CENTER ONLY)
Abs Immature Granulocytes: 0.11 10*3/uL — ABNORMAL HIGH (ref 0.00–0.07)
Basophils Absolute: 0.1 10*3/uL (ref 0.0–0.1)
Basophils Relative: 1 %
Eosinophils Absolute: 0.1 10*3/uL (ref 0.0–0.5)
Eosinophils Relative: 1 %
HCT: 33.6 % — ABNORMAL LOW (ref 39.0–52.0)
Hemoglobin: 10.9 g/dL — ABNORMAL LOW (ref 13.0–17.0)
Immature Granulocytes: 2 %
Lymphocytes Relative: 11 %
Lymphs Abs: 0.8 10*3/uL (ref 0.7–4.0)
MCH: 33 pg (ref 26.0–34.0)
MCHC: 32.4 g/dL (ref 30.0–36.0)
MCV: 101.8 fL — ABNORMAL HIGH (ref 80.0–100.0)
Monocytes Absolute: 0.9 10*3/uL (ref 0.1–1.0)
Monocytes Relative: 13 %
Neutro Abs: 5.3 10*3/uL (ref 1.7–7.7)
Neutrophils Relative %: 72 %
Platelet Count: 73 10*3/uL — ABNORMAL LOW (ref 150–400)
RBC: 3.3 MIL/uL — ABNORMAL LOW (ref 4.22–5.81)
RDW: 21.9 % — ABNORMAL HIGH (ref 11.5–15.5)
WBC Count: 7.2 10*3/uL (ref 4.0–10.5)
nRBC: 0.7 % — ABNORMAL HIGH (ref 0.0–0.2)

## 2021-07-11 LAB — CMP (CANCER CENTER ONLY)
ALT: 20 U/L (ref 0–44)
AST: 41 U/L (ref 15–41)
Albumin: 3.5 g/dL (ref 3.5–5.0)
Alkaline Phosphatase: 105 U/L (ref 38–126)
Anion gap: 9 (ref 5–15)
BUN: 16 mg/dL (ref 8–23)
CO2: 21 mmol/L — ABNORMAL LOW (ref 22–32)
Calcium: 9.4 mg/dL (ref 8.9–10.3)
Chloride: 108 mmol/L (ref 98–111)
Creatinine: 1.59 mg/dL — ABNORMAL HIGH (ref 0.61–1.24)
GFR, Estimated: 46 mL/min — ABNORMAL LOW (ref >60)
Glucose, Bld: 130 mg/dL — ABNORMAL HIGH (ref 70–99)
Potassium: 4.3 mmol/L (ref 3.5–5.1)
Sodium: 138 mmol/L (ref 135–145)
Total Bilirubin: 0.6 mg/dL (ref 0.3–1.2)
Total Protein: 7.1 g/dL (ref 6.5–8.1)

## 2021-07-11 MED ORDER — PALONOSETRON HCL INJECTION 0.25 MG/5ML
INTRAVENOUS | Status: AC
  Filled 2021-07-11: qty 5

## 2021-07-11 MED ORDER — SODIUM CHLORIDE 0.9% FLUSH
10.0000 mL | Freq: Once | INTRAVENOUS | Status: AC
  Administered 2021-07-11: 10 mL

## 2021-07-11 MED ORDER — OXALIPLATIN CHEMO INJECTION 100 MG/20ML
50.0000 mg/m2 | Freq: Once | INTRAVENOUS | Status: AC
  Administered 2021-07-11: 110 mg via INTRAVENOUS
  Filled 2021-07-11: qty 22

## 2021-07-11 MED ORDER — SODIUM CHLORIDE 0.9% FLUSH: 10.0000 mL | INTRAVENOUS | Status: DC | PRN

## 2021-07-11 MED ORDER — DEXTROSE 5 % IV SOLN: Freq: Once | INTRAVENOUS | Status: AC

## 2021-07-11 MED ORDER — SODIUM CHLORIDE 0.9 % IV SOLN
2000.0000 mg/m2 | INTRAVENOUS | Status: DC
  Administered 2021-07-11: 4400 mg via INTRAVENOUS
  Filled 2021-07-11: qty 88

## 2021-07-11 MED ORDER — PALONOSETRON HCL INJECTION 0.25 MG/5ML
0.2500 mg | Freq: Once | INTRAVENOUS | Status: AC
  Administered 2021-07-11: 0.25 mg via INTRAVENOUS

## 2021-07-11 MED ORDER — LEUCOVORIN CALCIUM INJECTION 350 MG
400.0000 mg/m2 | Freq: Once | INTRAVENOUS | Status: AC
  Administered 2021-07-11: 880 mg via INTRAVENOUS
  Filled 2021-07-11: qty 44

## 2021-07-11 NOTE — Progress Notes (Signed)
[place]   [phone] Fax:([phone]) [phone]   Clinic Follow up Note   Patient Care Team: [name], PA as PCP - General (Family Medicine) Lorretta Harp, MD as PCP - Cardiology (Cardiology) [name], MD as Consulting Physician (General Surgery) [name], [name], MD as Consulting Physician (Gastroenterology) [name], MD as Consulting Physician (Hematology) Lorretta Harp, MD as Consulting Physician (Cardiology)  Date of Service:  07/11/2021  CHIEF COMPLAINT: f/u of cholangiocarcinoma  SUMMARY OF ONCOLOGIC HISTORY: Oncology History Overview Note  Cancer Staging Intrahepatic cholangiocarcinoma [facility]) Staging form: Intrahepatic Bile Duct, AJCC 8th Edition - Clinical stage from 08/19/2019: Stage II (cT2, cN0, cM0) - Signed by [name], MD on 08/19/2019    Intrahepatic cholangiocarcinoma ([place])  06/28/2019 Imaging   CT AP W Contrast 06/28/19  IMPRESSION: 1. Heterogeneous hypodensity posteriorly in the right hepatic lobe and potentially extending into the caudate lobe suspicious for a mass. There is felt to be truncation of branches of the portal vein in this vicinity and some narrowing of the hepatic vein, as well as triangular-shaped regions of abnormal hypoenhancement posteriorly in the right hepatic lobe likely representing downstream vascular effects. Cannot exclude malignancy such as cholangiocarcinoma or hepatocellular carcinoma, and follow up hepatic protocol MRI with and without contrast is recommended to further characterize. 2. 4 mm right middle lobe pulmonary nodule is likely benign but may merit surveillance. 3. Cholelithiasis. 4.  Aortic Atherosclerosis (ICD10-I70.0). 5. Prostatomegaly. 6. Mild impingement at L3-4 and L4-5.   07/31/2019 Imaging   MRI Liver 07/31/19 IMPRESSION: 1. 7.3 cm in long axis mass in the right hepatic lobe spanning into the caudate lobe, high suspicion for malignancy such as hepatocellular carcinoma or  cholangiocarcinoma. Suspected effacement or occlusion of the right hepatic vein and posterior branches of the right portal vein. Two smaller tumor nodules along the posterior periphery of the dominant mass. Tissue diagnosis is recommended. 2. No findings of pathologic adenopathy or distant metastatic spread. 3. 9 mm gallstone in the gallbladder. There is mild gallbladder wall thickening which may be from nondistention, correlate clinically in assessing for cholecystitis. 4.  Aortic Atherosclerosis (ICD10-I70.0). 5. Mild diffuse hepatic steatosis.   08/11/2019 Initial Biopsy   DIAGNOSIS: 08/11/19  A. LIVER, RIGHT, BIOPSY:  - Adenocarcinoma.   08/18/2019 Imaging   CT Chest 08/18/19  IMPRESSION: 1. Multiple pulmonary nodules largest at approximately 7 mm in the right lower lobe, nonspecific but concerning given findings in the liver. 2. No signs of definitive metastatic disease, also with mildly enlarged upper abdominal lymph nodes as discussed.   Aortic Atherosclerosis (ICD10-I70.0).   08/19/2019 Initial Diagnosis   Intrahepatic cholangiocarcinoma ([place])   08/19/2019 Cancer Staging   Staging form: Intrahepatic Bile Duct, AJCC 8th Edition - Clinical stage from 08/19/2019: Stage II (cT2, cN0, cM0) - Signed by [name], MD on 08/19/2019   09/29/2019 - 07/19/2020 Chemotherapy   Cisplatin and Gemcitabine 2 weeks on/1 week off starting 09/29/19. Cisplatin held from cycle 9 (03/23/20) due to fluid status/Afib. He is now on maintenance Gemcitabine. Held after 07/19/20 to proceed with liver target therapy.     10/09/2019 Imaging   CT AP IMPRESSION: 1. The dominant right hepatic lobe mass is minimally reduced in size compared to prior exams, currently measuring 6.2 by 5.3 cm, previously 6.2 by 5.6 cm. However, there is a new small hypodense lesion centrally in the right hepatic lobe which is suspicious for a new small focus of tumor. Accordingly this is an overall mixed appearance.  2. Continued  hypoenhancement in the liver downstream of the tumor likely attributable to narrowing or occlusion of the right hepatic vein by the tumor. By virtue of its location the tumor wraps around the intrahepatic portion of the IVC. 3. 4 mm right middle lobe pulmonary nodule, stable compared to earliest available comparison of 07/18/2019. Surveillance of the patient's pulmonary nodules is recommended. 4. Other imaging findings of potential clinical significance: Coronary atherosclerosis. Cholelithiasis. Prominent stool throughout the colon favors constipation. Moderate prostatomegaly with heterogeneous enhancement of the prostate gland. Lumbar spondylosis and degenerative disc disease causing mild bilateral foraminal impingement at L3-4 and L4-5.   Aortic Atherosclerosis (ICD10-I70.0).   12/24/2019 Imaging   CT CAP W Contrast  IMPRESSION: 1. Right hepatic lobe mass and adjacent right hepatic lobe nodules appear grossly stable. No evidence of distant metastatic disease. 2. Continued stability of small pulmonary nodules. Recommend attention on follow-up. 3. Cholelithiasis. 4. Enlarged prostate. 5. Aortic atherosclerosis (ICD10-I70.0). Coronary artery calcification.   02/23/2020 Procedure   He had PAC placed on 02/23/20.    04/08/2020 Imaging   CT CAP  IMPRESSION: 1. Stable or minimally decreased size of a very ill-defined, hypodense and somewhat retractile appearing mass of the central right lobe of the liver abutting the inferior vena cava and right portal vein, measuring approximately 5.7 x 5.0 cm, previously 6.2 x 5.2 cm when measured similarly. Findings are consistent with stable or minimally improved cholangiocarcinoma. 2. Small hypodense nodules of the right lobe identified on prior examination are poorly appreciated on this single phase contrast examination although not grossly changed. Attention on follow-up. 3. There are new, moderate bilateral pleural effusions and associated atelectasis  or consolidation as well as a new small pericardial effusion, nonspecific although generally concerning and suspicious for malignant effusions. There is no directly visualized pleural nodularity. 4. Multiple small pulmonary nodules are stable.  No new nodules. 5. Coronary artery disease. Aortic Atherosclerosis (ICD10-I70.0).   07/06/2020 Imaging   MRI ABD IMPRESSION: 1. Interval decrease in size of the right hepatic lobe lesion in the medial aspect of the segment 7. Findings would suggest a good response to treatment with some contraction of the tumor. 2. No new hepatic lesions. No abdominal adenopathy or metastatic disease. 3. Stable mild intrahepatic biliary dilatation in the right hepatic lobe distal to the lesion. 4. Somewhat tortuous and almost beaded appearance of the hepatic and portal vein radicles. Findings could be radiation related. 5. Cholelithiasis.  CT chest wo contrast IMPRESSION: 1. Persistent/stable moderate-sized bilateral pleural effusions with overlying atelectasis. 2. Stable small right pulmonary nodules. No new or progressive findings. Recommend continued surveillance. 3. No mediastinal or hilar mass or adenopathy. 4. Stable advanced three-vessel coronary artery calcifications. 5. Cholelithiasis. Aortic Atherosclerosis (ICD10-I70.0)   08/11/2020 Procedure   Y90 on 08/11/20 and 08/26/20 with Dr Laurence Ferrari    11/30/2020 Imaging   MRI Abdomen  IMPRESSION: 1. Today's study demonstrates progression of disease with enlarging central lesion in the right lobe of the liver involving portions of segments 7 and 8, now with evidence of some tumor thrombus extension into the intrahepatic portion of the inferior vena cava. This is associated with increasing intrahepatic biliary ductal dilatation in segment 7 of the liver. 2. In addition, there is some subtle hyperenhancement of the distal common bile duct at and immediately proximally to the level of the ampulla. There is  also some subtle delayed enhancement around this region in the pancreatic head. This is of uncertain etiology and significance, but may be inflammatory and  currently is not associated with proximal common bile duct dilatation. However, close attention on follow-up imaging is recommended. 3. Cholelithiasis without evidence of acute cholecystitis at this time. 4. Aortic atherosclerosis.     12/24/2020 - 03/21/2021 Chemotherapy   Restart Gemcitabine 2 weeks on/1 week off given disease progression beginning 12/24/20. Discontinued after 03/21/21 due to disease progression in liver.     03/16/2021 Imaging   MRI abdomen  IMPRESSION: No significant change in size of irregular hypovascular mass in the medial right hepatic lobe.   New 1.1 cm hypovascular lesion with peripheral rim enhancement in liver dome, suspicious for metastatic disease.   New small right pleural effusion and mild ascites.   Cholelithiasis, without evidence of cholecystitis or biliary dilatation.   04/05/2021 -  Chemotherapy   Second-line FOLFOX q2weeks starting 04/05/21    04/05/2021 -  Chemotherapy   Immunotherapy with Durvalumab (Imfinzi) q4weeks starting 04/05/21. (in addition to second line FOLFOX)   06/23/2021 Imaging   MRI Abdomen IMPRESSION: 1. No significant change in subcapsular mass of the posterior right lobe of the liver. 2. No significant change in an additional small hyperenhancing lesion of the liver dome, which remains suspicious for a satellite lesion. 3. No new liver lesions. 4. 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 5. Cholelithiasis without evidence of acute cholecystitis. 6. Trace ascites.  CT Chest w/o contrast IMPRESSION: 1. Occasional small pulmonary nodules in the right lung are stable, for example a 6 mm nodule right lower lobe and a 3 mm nodule of the lateral segment right middle lobe. These are nonspecific and likely benign, incidental sequelae of infection or inflammation,  metastatic disease not favored. Attention on follow-up. 2. Previously seen bilateral pleural effusions are resolved. 3. Hypodense lesion of the posterior right lobe of the liver, keeping with known cholangiocarcinoma and better characterized by same day MR. 4. Coronary artery disease.      CURRENT THERAPY:  Second-line FOLFOX q2weeks starting 04/05/21 Additional Durvalumab (Imfinzi) q4weeks starting 04/05/21.  INTERVAL HISTORY:  Brandon Sherman is here for a follow up of cholangiocarcinoma. He was last seen by me on 06/27/21. He presents to the clinic accompanied by his wife. He reports feeling well today. He notes vision change and wonders if it's from chemo. He notes he now needs glasses to drive. He reports some mild tingling to his fingers, and it does not affect his activities. He also reports some fatigue and has to take more breaks, but he continues to do all his normal activities.  He notes some mild taste change for a few days following treatment, but he notes he continues to eat well.   All other systems were reviewed with the patient and are negative.  MEDICAL HISTORY:  Past Medical History:  Diagnosis Date   Arthritis    Diabetes ([place])    GERD (gastroesophageal reflux disease)    Hyperlipidemia    Hypertension    Intrahepatic cholangiocarcinoma ([place])     SURGICAL HISTORY: Past Surgical History:  Procedure Laterality Date   APPENDECTOMY  1980   CARDIOVERSION N/A 04/27/2020   Procedure: CARDIOVERSION;  Surgeon: Dorothy Spark, MD;  Location: [facility] ENDOSCOPY;  Service: Cardiovascular;  Laterality: N/A;   CARDIOVERSION N/A 05/19/2020   Procedure: CARDIOVERSION;  Surgeon: Elouise Munroe, MD;  Location: [facility] ENDOSCOPY;  Service: Cardiovascular;  Laterality: N/A;   COLONOSCOPY     IR 3D INDEPENDENT WKST  08/11/2020   IR 3D INDEPENDENT WKST  08/11/2020   IR ANGIOGRAM SELECTIVE EACH ADDITIONAL  VESSEL  08/11/2020   IR ANGIOGRAM SELECTIVE EACH ADDITIONAL VESSEL  08/11/2020    IR ANGIOGRAM SELECTIVE EACH ADDITIONAL VESSEL  08/11/2020   IR ANGIOGRAM SELECTIVE EACH ADDITIONAL VESSEL  08/11/2020   IR ANGIOGRAM SELECTIVE EACH ADDITIONAL VESSEL  08/11/2020   IR ANGIOGRAM SELECTIVE EACH ADDITIONAL VESSEL  08/11/2020   IR ANGIOGRAM SELECTIVE EACH ADDITIONAL VESSEL  08/26/2020   IR ANGIOGRAM SELECTIVE EACH ADDITIONAL VESSEL  08/26/2020   IR ANGIOGRAM VISCERAL SELECTIVE  08/11/2020   IR ANGIOGRAM VISCERAL SELECTIVE  08/26/2020   IR EMBO ARTERIAL NOT HEMORR HEMANG INC GUIDE ROADMAPPING  08/11/2020   IR EMBO TUMOR ORGAN ISCHEMIA INFARCT INC GUIDE ROADMAPPING  08/26/2020   IR IMAGING GUIDED PORT INSERTION  02/23/2020   IR RADIOLOGIST EVAL & MGMT  07/14/2020   IR RADIOLOGIST EVAL & MGMT  09/30/2020   IR RADIOLOGIST EVAL & MGMT  12/01/2020   IR US GUIDE VASC ACCESS LEFT  08/26/2020   IR US GUIDE VASC ACCESS RIGHT  08/11/2020    I have reviewed the social history and family history with the patient and they are unchanged from previous note.  ALLERGIES:  has No Known Allergies.  MEDICATIONS:  Current Outpatient Medications  Medication Sig Dispense Refill   allopurinol (ZYLOPRIM) 100 MG tablet Take 100 mg by mouth daily.     colchicine 0.6 MG tablet Take 1 tablet (0.6 mg total) by mouth daily as needed (gout flare). Take daily for 3 days, then as needed for gout flare 30 tablet 0   diltiazem (CARDIZEM CD) 240 MG 24 hr capsule TAKE 1 CAPSULE BY MOUTH EVERY DAY, STOP AMLODIPINE 90 capsule 3   ELIQUIS 5 MG TABS tablet TAKE 1 TABLET TWICE DAILY 180 tablet 1   fenofibrate (TRICOR) 145 MG tablet Take 145 mg by mouth daily.     finasteride (PROSCAR) 5 MG tablet Take 5 mg by mouth daily.     furosemide (LASIX) 40 MG tablet Take 40 mg twice a day 90 tablet 3   gabapentin (NEURONTIN) 100 MG capsule Take 100 mg by mouth at bedtime.     hydrALAZINE (APRESOLINE) 50 MG tablet TAKE 2 TABLETS BY MOUTH IN THE MORNING, 1 TABLET AT LUNCH AND 2 IN THE EVENING 450 tablet 1   hydrALAZINE (APRESOLINE) 50 MG  tablet Take 50 mg by mouth 2 (two) times daily.     KLOR-CON M20 20 MEQ tablet TAKE 1 TABLET BY MOUTH TWICE A DAY 180 tablet 3   lidocaine-prilocaine (EMLA) cream Apply 1 application topically as needed. 30 g 3   metoprolol succinate (TOPROL XL) 25 MG 24 hr tablet Take 1 tablet (25 mg total) by mouth daily. 90 tablet 3   metoprolol tartrate (LOPRESSOR) 50 MG tablet      pantoprazole (PROTONIX) 40 MG tablet TAKE 1 TABLET BY MOUTH TWICE A DAY 180 tablet 2   pravastatin (PRAVACHOL) 40 MG tablet Take 40 mg by mouth daily.     sucralfate (CARAFATE) 1 g tablet Take 1 tablet (1 g total) by mouth every 6 (six) hours as needed. Please schedule a yearly follow up for further refills: [phone] 60 tablet 0   tamsulosin (FLOMAX) 0.4 MG CAPS capsule Take 0.4 mg by mouth daily.     No current facility-administered medications for this visit.    PHYSICAL EXAMINATION: ECOG PERFORMANCE STATUS: 1 - Symptomatic but completely ambulatory  Vitals:   07/11/21 0925  BP: 135/78  Pulse: 78  Resp: 19  Temp: 98.5 F (36.9 C)  SpO2:  98%   Wt Readings from Last 3 Encounters:  07/11/21 216 lb 8 oz (98.2 kg)  06/27/21 213 lb 8 oz (96.8 kg)  06/14/21 217 lb (98.4 kg)     GENERAL:alert, no distress and comfortable SKIN: skin color normal, no rashes or significant lesions EYES: normal, Conjunctiva are pink and non-injected, sclera clear  NEURO: alert & oriented x 3 with fluent speech  LABORATORY DATA:  I have reviewed the data as listed CBC Latest Ref Rng & Units 07/11/2021 06/27/2021 06/13/2021  WBC 4.0 - 10.5 K/uL 7.2 2.9(L) 3.3(L)  Hemoglobin 13.0 - 17.0 g/dL 10.9(L) 11.2(L) 11.2(L)  Hematocrit 39.0 - 52.0 % 33.6(L) 33.7(L) 33.3(L)  Platelets 150 - 400 K/uL 73(L) 112(L) 119(L)     CMP Latest Ref Rng & Units 07/11/2021 06/27/2021 06/13/2021  Glucose 70 - 99 mg/dL 130(H) 155(H) 193(H)  BUN 8 - 23 mg/dL _0 Creatinine 0.61 - 1.24 mg/dL 1.59(H) 1.65(H) 1.73(H)  Sodium 135 - 145 mmol/L 138 140 137   Potassium 3.5 - 5.1 mmol/L 4.3 3.9 4.1  Chloride 98 - 111 mmol/L 108 108 104  CO2 22 - 32 mmol/L 21(L) 22 24  Calcium 8.9 - 10.3 mg/dL 9.4 9.5 9.4  Total Protein 6.5 - 8.1 g/dL 7.1 7.0 7.2  Total Bilirubin 0.3 - 1.2 mg/dL 0.6 0.4 0.4  Alkaline Phos 38 - 126 U/L 105 64 62  AST 15 - 41 U/L 41 29 44(H)  ALT 0 - 44 U/L _1 RADIOGRAPHIC STUDIES: I have personally reviewed the radiological images as listed and agreed with the findings in the report. No results found.   ASSESSMENT & PLAN:  Brandon Sherman is a 75 y.o. male with   1. Intrahepatic cholangiocarcinoma, cT2N0M0, stage II, unresectable, with indeterminate lung nodules  -He was diagnosed in 07/2019. CT scans and MRI liver show a large 7.3cm mass in the right hepatic lobe which abuts portal vein. -He was seen by our local surgeon Dr. Barry Dienes and Dr Carlis Abbott at [facility], both concluded that cancer is not resectable due to the invasion to portal vein. Given cancer is non-resectable his cancer is likely not curable but still treatable. -He was treated with standard first line chemo with IV Cisplatin and Gemcitabine 2 weeks on/1 week off beginning 09/29/19. Cisplatin was held since C9 (03/23/20) due to fluid status/AFib. He continued maintenance Gemcitabine until 07/19/20. He proceeded with Y90 on 08/11/20 and 08/26/20. -His MRI Abdomen from 12/11/20 showed progression of disease in liver with tumor thrombus extension into IVC. Given disease progression, Dr Geroge Baseman does not recommend more Y90 or liver target therapy at this point.  -To control his disease I restarted Single agent Gemcitabine 2 weeks on/1 week off beginning 12/24/20.  -He has IDH-1 mutation, which makes him eligible for an oral IDH inhibitor drug Ivosidenib.  -Based on 03/16/21 MRI he progressed with New 1.1 cm hypovascular lesion suspicious for metastatic disease. -He is not a candidate for cisplatin due to his CKD and CHF. I changed him to second-line chemo with FOLFOX  q2weeks and added immunotherapy Durvalumab (Imfinzi) q3-4weeks starting 04/05/21. Goal of chemo is to control his disease and prolong his life.  -Restaging CT chest and MRI abd on 06/23/21 showed stable disease in liver, small and stable lung nodules, no new lesions. Given stable disease, I will continue current therapy -S/p C7 he continues tolerating current regimen well overall. Labs reviewed, overall adequate to proceed with C8 FOLFOX today.  -  Continue FOLFOX q2weeks and Imfinzi q4weeks. -F/u in 2 weeks.     2. DM, HTN, HLD, Gout, LE edema -On Metformin, amlodipine, lisinopril., allopurinol, Lasix. Continue to f/u with his PCP and cardiologist. He is now on Lasix three days a week. -His PCP has started him on Janumet for his feet burning.    3. AF and CHF -With PAC placement on 02/23/20 he was found to have Afib. Based on CHADS2 Score 2. He is at moderate risk for stroke.  -He underwent Cardioversion with Dr. Meda Coffee on 04/27/20. After CHF in late 04/2020 he had another cardioversion on 05/19/20. He has recovered well.  -His Afib is intermittent. He will continue his medications and f/u with cardiologist. He is on Eliquis    4. Nicotine Use -He never smoked but has been chewing Tobacco for the past 60 years. He no longer drinks alcohol.  -He currently chews 1-2 times a day. I have repeatedly discussed and encouraged tobacco cessation.    5. GERD and gastritis, history of esophageal candidiasis, DM  -He had repeated EGD with Dr. Havery Moros in 07/2019. 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 -Will monitor and may repeat EGD in future  -He is not currently on DM medication. I encouraged him to watch his diet and reduce sugar.      PLAN:  -lab reviewed. Proceed with C8 FOLFOX today, will reduce both chemo dose due to his thrombocytopenia  -Lab, flush, f/u, and FOLFOX with Imfinzi in 2 weeks.    No  problem-specific Assessment & Plan notes found for this encounter.   No orders of the defined types were placed in this encounter.  All questions were answered. The patient knows to call the clinic with any problems, questions or concerns. No barriers to learning was detected. The total time spent in the appointment was 30 minutes.     [name], MD 07/11/2021   I, Wilburn Mylar, am acting as scribe for [name], MD.   I have reviewed the above documentation for accuracy and completeness, and I agree with the above.

## 2021-07-11 NOTE — Progress Notes (Signed)
Okay to treat with plt 73 and creat 1.59 per Dr. Burr Medico

## 2021-07-11 NOTE — Patient Instructions (Addendum)
[facility]  Discharge Instructions: Thank you for choosing [facility] to provide your oncology and hematology care.   If you have a lab appointment with the [facility], please go directly to the [facility] and check in at the registration area.   Wear comfortable clothing and clothing appropriate for easy access to any Portacath or PICC line.   We strive to give you quality time with your provider. You may need to reschedule your appointment if you arrive late (15 or more minutes).  Arriving late affects you and other patients whose appointments are after yours.  Also, if you miss three or more appointments without notifying the office, you may be dismissed from the clinic at the provider's discretion.      For prescription refill requests, have your pharmacy contact our office and allow 72 hours for refills to be completed.    Today you received the following chemotherapy and/or immunotherapy agents : Leucovorin, Oxaliplatin, 5FU     To help prevent nausea and vomiting after your treatment, we encourage you to take your nausea medication as directed.  BELOW ARE SYMPTOMS THAT SHOULD BE REPORTED IMMEDIATELY: *FEVER GREATER THAN 100.4 F (38 C) OR HIGHER *CHILLS OR SWEATING *NAUSEA AND VOMITING THAT IS NOT CONTROLLED WITH YOUR NAUSEA MEDICATION *UNUSUAL SHORTNESS OF BREATH *UNUSUAL BRUISING OR BLEEDING *URINARY PROBLEMS (pain or burning when urinating, or frequent urination) *BOWEL PROBLEMS (unusual diarrhea, constipation, pain near the anus) TENDERNESS IN MOUTH AND THROAT WITH OR WITHOUT PRESENCE OF ULCERS (sore throat, sores in mouth, or a toothache) UNUSUAL RASH, SWELLING OR PAIN  UNUSUAL VAGINAL DISCHARGE OR ITCHING   Items with * indicate a potential emergency and should be followed up as soon as possible or go to the Emergency Department if any problems should occur.  Please show the CHEMOTHERAPY ALERT CARD or IMMUNOTHERAPY ALERT  CARD at check-in to the Emergency Department and triage nurse.  Should you have questions after your visit or need to cancel or reschedule your appointment, please contact [facility]  Dept: [phone]  and follow the prompts.  Office hours are 8:00 a.m. to 4:30 p.m. Monday - Friday. Please note that voicemails left after 4:00 p.m. may not be returned until the following business day.  We are closed weekends and major holidays. You have access to a nurse at all times for urgent questions. Please call the main number to the clinic Dept: [phone] and follow the prompts.   For any non-urgent questions, you may also contact your provider using MyChart. We now offer e-Visits for anyone 52 and older to request care online for non-urgent symptoms. For details visit [email or web address].   Also download the MyChart app! Go to the app store, search "MyChart", open the app, select [facility], and log in with your MyChart username and password.  Due to Covid, a mask is required upon entering the hospital/clinic. If you do not have a mask, one will be given to you upon arrival. For doctor visits, patients may have 1 support person aged 31 or older with them. For treatment visits, patients cannot have anyone with them due to current Covid guidelines and our immunocompromised population.   The chemotherapy medication bag should finish at 46 hours, 96 hours, or 7 days. For example, if your pump is scheduled for 46 hours and it was put on at 4:00 p.m., it should finish at 2:00 p.m. the day it is scheduled to come off regardless  of your appointment time.     Estimated time to finish at 07/13/21 at 11:30.   If the display on your pump reads "Low Volume" and it is beeping, take the batteries out of the pump and come to the cancer center for it to be taken off.   If the pump alarms go off prior to the pump reading "Low Volume" then call [phone] and someone can assist  you.  If the plunger comes out and the chemotherapy medication is leaking out, please use your home chemo spill kit to clean up the spill. Do NOT use paper towels or other household products.  If you have problems or questions regarding your pump, please call either [phone] (24 hours a day) or the cancer center Monday-Friday 8:00 a.m.- 4:30 p.m. at the clinic number and we will assist you. If you are unable to get assistance, then go to the nearest Emergency Department and ask the staff to contact the IV team for assistance.

## 2021-07-13 ENCOUNTER — Other Ambulatory Visit: Payer: Self-pay

## 2021-07-13 ENCOUNTER — Inpatient Hospital Stay: Payer: Medicare HMO

## 2021-07-13 VITALS — BP 146/64 | HR 74 | Temp 98.4°F | Resp 18

## 2021-07-13 DIAGNOSIS — Z5189 Encounter for other specified aftercare: Secondary | ICD-10-CM | POA: Diagnosis not present

## 2021-07-13 DIAGNOSIS — C221 Intrahepatic bile duct carcinoma: Secondary | ICD-10-CM

## 2021-07-13 DIAGNOSIS — Z7189 Other specified counseling: Secondary | ICD-10-CM

## 2021-07-13 DIAGNOSIS — Z79899 Other long term (current) drug therapy: Secondary | ICD-10-CM | POA: Diagnosis not present

## 2021-07-13 DIAGNOSIS — Z5112 Encounter for antineoplastic immunotherapy: Secondary | ICD-10-CM | POA: Diagnosis not present

## 2021-07-13 DIAGNOSIS — Z5111 Encounter for antineoplastic chemotherapy: Secondary | ICD-10-CM | POA: Diagnosis not present

## 2021-07-13 MED ORDER — HEPARIN SOD (PORK) LOCK FLUSH 100 UNIT/ML IV SOLN
500.0000 [IU] | Freq: Once | INTRAVENOUS | Status: AC | PRN
  Administered 2021-07-13: 500 [IU]

## 2021-07-13 MED ORDER — PEGFILGRASTIM-JMDB 6 MG/0.6ML ~~LOC~~ SOSY
6.0000 mg | PREFILLED_SYRINGE | Freq: Once | SUBCUTANEOUS | Status: AC
  Administered 2021-07-13: 6 mg via SUBCUTANEOUS
  Filled 2021-07-13: qty 0.6

## 2021-07-13 MED ORDER — SODIUM CHLORIDE 0.9% FLUSH
10.0000 mL | INTRAVENOUS | Status: DC | PRN
Start: 2021-07-13 — End: 2021-07-13
  Administered 2021-07-13: 10 mL

## 2021-07-26 ENCOUNTER — Inpatient Hospital Stay: Payer: Medicare HMO

## 2021-07-26 ENCOUNTER — Inpatient Hospital Stay: Payer: Medicare HMO | Attending: Hematology | Admitting: Hematology

## 2021-07-26 ENCOUNTER — Encounter: Payer: Self-pay | Admitting: Hematology

## 2021-07-26 ENCOUNTER — Other Ambulatory Visit: Payer: Self-pay

## 2021-07-26 VITALS — BP 110/73 | HR 68 | Temp 98.2°F | Resp 18 | Ht 69.0 in | Wt 212.8 lb

## 2021-07-26 DIAGNOSIS — Z5111 Encounter for antineoplastic chemotherapy: Secondary | ICD-10-CM | POA: Insufficient documentation

## 2021-07-26 DIAGNOSIS — Z79899 Other long term (current) drug therapy: Secondary | ICD-10-CM | POA: Diagnosis not present

## 2021-07-26 DIAGNOSIS — Z452 Encounter for adjustment and management of vascular access device: Secondary | ICD-10-CM | POA: Diagnosis not present

## 2021-07-26 DIAGNOSIS — C221 Intrahepatic bile duct carcinoma: Secondary | ICD-10-CM | POA: Diagnosis not present

## 2021-07-26 DIAGNOSIS — Z95828 Presence of other vascular implants and grafts: Secondary | ICD-10-CM

## 2021-07-26 DIAGNOSIS — Z5112 Encounter for antineoplastic immunotherapy: Secondary | ICD-10-CM | POA: Insufficient documentation

## 2021-07-26 DIAGNOSIS — Z7189 Other specified counseling: Secondary | ICD-10-CM

## 2021-07-26 LAB — CMP (CANCER CENTER ONLY)
ALT: 20 U/L (ref 0–44)
AST: 31 U/L (ref 15–41)
Albumin: 3.6 g/dL (ref 3.5–5.0)
Alkaline Phosphatase: 102 U/L (ref 38–126)
Anion gap: 10 (ref 5–15)
BUN: 20 mg/dL (ref 8–23)
CO2: 21 mmol/L — ABNORMAL LOW (ref 22–32)
Calcium: 9.8 mg/dL (ref 8.9–10.3)
Chloride: 108 mmol/L (ref 98–111)
Creatinine: 1.53 mg/dL — ABNORMAL HIGH (ref 0.61–1.24)
GFR, Estimated: 48 mL/min — ABNORMAL LOW (ref >60)
Glucose, Bld: 118 mg/dL — ABNORMAL HIGH (ref 70–99)
Potassium: 4.3 mmol/L (ref 3.5–5.1)
Sodium: 139 mmol/L (ref 135–145)
Total Bilirubin: 0.5 mg/dL (ref 0.3–1.2)
Total Protein: 7 g/dL (ref 6.5–8.1)

## 2021-07-26 LAB — CBC WITH DIFFERENTIAL (CANCER CENTER ONLY)
Abs Immature Granulocytes: 0.08 10*3/uL — ABNORMAL HIGH (ref 0.00–0.07)
Basophils Absolute: 0.1 10*3/uL (ref 0.0–0.1)
Basophils Relative: 1 %
Eosinophils Absolute: 0.1 10*3/uL (ref 0.0–0.5)
Eosinophils Relative: 1 %
HCT: 34.9 % — ABNORMAL LOW (ref 39.0–52.0)
Hemoglobin: 11.3 g/dL — ABNORMAL LOW (ref 13.0–17.0)
Immature Granulocytes: 1 %
Lymphocytes Relative: 11 %
Lymphs Abs: 0.8 10*3/uL (ref 0.7–4.0)
MCH: 34 pg (ref 26.0–34.0)
MCHC: 32.4 g/dL (ref 30.0–36.0)
MCV: 105.1 fL — ABNORMAL HIGH (ref 80.0–100.0)
Monocytes Absolute: 0.9 10*3/uL (ref 0.1–1.0)
Monocytes Relative: 13 %
Neutro Abs: 5.3 10*3/uL (ref 1.7–7.7)
Neutrophils Relative %: 73 %
Platelet Count: 125 10*3/uL — ABNORMAL LOW (ref 150–400)
RBC: 3.32 MIL/uL — ABNORMAL LOW (ref 4.22–5.81)
RDW: 21.2 % — ABNORMAL HIGH (ref 11.5–15.5)
WBC Count: 7.2 10*3/uL (ref 4.0–10.5)
nRBC: 0.3 % — ABNORMAL HIGH (ref 0.0–0.2)

## 2021-07-26 LAB — TSH: TSH: 2.405 u[IU]/mL (ref 0.320–4.118)

## 2021-07-26 LAB — MAGNESIUM: Magnesium: 1.9 mg/dL (ref 1.7–2.4)

## 2021-07-26 MED ORDER — SODIUM CHLORIDE 0.9 % IV SOLN
1500.0000 mg | Freq: Once | INTRAVENOUS | Status: AC
  Administered 2021-07-26: 1500 mg via INTRAVENOUS
  Filled 2021-07-26: qty 30

## 2021-07-26 MED ORDER — DEXTROSE 5 % IV SOLN
400.0000 mg/m2 | Freq: Once | INTRAVENOUS | Status: AC
  Administered 2021-07-26: 880 mg via INTRAVENOUS
  Filled 2021-07-26: qty 44

## 2021-07-26 MED ORDER — SODIUM CHLORIDE 0.9% FLUSH
10.0000 mL | Freq: Once | INTRAVENOUS | Status: AC
  Administered 2021-07-26: 10 mL

## 2021-07-26 MED ORDER — OXALIPLATIN CHEMO INJECTION 100 MG/20ML
50.0000 mg/m2 | Freq: Once | INTRAVENOUS | Status: AC
  Administered 2021-07-26: 110 mg via INTRAVENOUS
  Filled 2021-07-26: qty 20

## 2021-07-26 MED ORDER — PALONOSETRON HCL INJECTION 0.25 MG/5ML
0.2500 mg | Freq: Once | INTRAVENOUS | Status: AC
  Administered 2021-07-26: 0.25 mg via INTRAVENOUS
  Filled 2021-07-26: qty 5

## 2021-07-26 MED ORDER — FLUOROURACIL CHEMO INJECTION 5 GM/100ML: 2400.0000 mg/m2 | INTRAVENOUS | Status: DC

## 2021-07-26 MED ORDER — DEXTROSE 5 % IV SOLN
Freq: Once | INTRAVENOUS | Status: AC
Start: 2021-07-26 — End: 2021-07-26

## 2021-07-26 MED ORDER — SODIUM CHLORIDE 0.9 % IV SOLN: Freq: Once | INTRAVENOUS | Status: AC

## 2021-07-26 MED ORDER — SODIUM CHLORIDE 0.9 % IV SOLN
2000.0000 mg/m2 | INTRAVENOUS | Status: DC
  Administered 2021-07-26: 4400 mg via INTRAVENOUS
  Filled 2021-07-26: qty 88

## 2021-07-26 NOTE — Progress Notes (Signed)
Continue 5FU CIV at '2000mg'$ /m2 per MD.  Acquanetta Belling, RPH, BCPS, BCOP 07/26/2021 12:07 PM

## 2021-07-26 NOTE — Patient Instructions (Signed)
[facility]  Discharge Instructions: Thank you for choosing [facility] to provide your oncology and hematology care.   If you have a lab appointment with the [facility], please go directly to the [facility] and check in at the registration area.   Wear comfortable clothing and clothing appropriate for easy access to any Portacath or PICC line.   We strive to give you quality time with your provider. You may need to reschedule your appointment if you arrive late (15 or more minutes).  Arriving late affects you and other patients whose appointments are after yours.  Also, if you miss three or more appointments without notifying the office, you may be dismissed from the clinic at the provider's discretion.      For prescription refill requests, have your pharmacy contact our office and allow 72 hours for refills to be completed.    Today you received the following chemotherapy and/or immunotherapy agents folfox and imfinzi      To help prevent nausea and vomiting after your treatment, we encourage you to take your nausea medication as directed.  BELOW ARE SYMPTOMS THAT SHOULD BE REPORTED IMMEDIATELY: *FEVER GREATER THAN 100.4 F (38 C) OR HIGHER *CHILLS OR SWEATING *NAUSEA AND VOMITING THAT IS NOT CONTROLLED WITH YOUR NAUSEA MEDICATION *UNUSUAL SHORTNESS OF BREATH *UNUSUAL BRUISING OR BLEEDING *URINARY PROBLEMS (pain or burning when urinating, or frequent urination) *BOWEL PROBLEMS (unusual diarrhea, constipation, pain near the anus) TENDERNESS IN MOUTH AND THROAT WITH OR WITHOUT PRESENCE OF ULCERS (sore throat, sores in mouth, or a toothache) UNUSUAL RASH, SWELLING OR PAIN  UNUSUAL VAGINAL DISCHARGE OR ITCHING   Items with * indicate a potential emergency and should be followed up as soon as possible or go to the Emergency Department if any problems should occur.  Please show the CHEMOTHERAPY ALERT CARD or IMMUNOTHERAPY ALERT CARD at  check-in to the Emergency Department and triage nurse.  Should you have questions after your visit or need to cancel or reschedule your appointment, please contact [facility]  Dept: [phone]  and follow the prompts.  Office hours are 8:00 a.m. to 4:30 p.m. Monday - Friday. Please note that voicemails left after 4:00 p.m. may not be returned until the following business day.  We are closed weekends and major holidays. You have access to a nurse at all times for urgent questions. Please call the main number to the clinic Dept: [phone] and follow the prompts.   For any non-urgent questions, you may also contact your provider using MyChart. We now offer e-Visits for anyone 69 and older to request care online for non-urgent symptoms. For details visit [email or web address].   Also download the MyChart app! Go to the app store, search "MyChart", open the app, select [facility], and log in with your MyChart username and password.  Due to Covid, a mask is required upon entering the hospital/clinic. If you do not have a mask, one will be given to you upon arrival. For doctor visits, patients may have 1 support person aged 4 or older with them. For treatment visits, patients cannot have anyone with them due to current Covid guidelines and our immunocompromised population.

## 2021-07-26 NOTE — Progress Notes (Signed)
[place]   [phone]) [phone] [phone]   Clinic Follow up Note   Patient Care Team: [name], PA as PCP - General (Family Medicine) Lorretta Harp, MD as PCP - Cardiology (Cardiology) [name], MD as Consulting Physician (General Surgery) [name], [name], MD as Consulting Physician (Gastroenterology) [name], MD as Consulting Physician (Hematology) Lorretta Harp, MD as Consulting Physician (Cardiology)  Date of Service:  07/26/2021  CHIEF COMPLAINT: f/u of cholangiocarcinoma  CURRENT THERAPY:  Second-line FOLFOX q2weeks starting 04/05/21 Additional Durvalumab (Imfinzi) q4weeks starting 04/05/21.  ASSESSMENT & PLAN:  Brandon Sherman is a 75 y.o. male with   1. Intrahepatic cholangiocarcinoma, cT2N0M0, stage II, unresectable, with indeterminate lung nodules  -He was diagnosed in 07/2019. CT scans and MRI liver show a large 7.3cm mass in the right hepatic lobe which abuts portal vein. -His cancer is not resectable due to the invasion to portal vein. -He started IV Cisplatin and Gemcitabine 2 weeks on/1 week off on 09/29/19. Cisplatin was held since C9 (03/23/20) due to fluid status/AFib. He continued maintenance Gemcitabine until 07/19/20. He proceeded with Y90 on 08/11/20 and 08/26/20. -His MRI Abdomen from 12/11/20 showed progression of disease in liver with tumor thrombus extension into IVC. Given disease progression, Dr Geroge Baseman does not recommend more Y90 or liver target therapy at this point.  -To control his disease I restarted Single agent Gemcitabine 2 weeks on/1 week off beginning 12/24/20.  -He has IDH-1 mutation, which makes him eligible for an oral IDH inhibitor drug Ivosidenib.  -Based on 03/16/21 MRI he progressed with New 1.1 cm hypovascular lesion suspicious for metastatic disease. -He is not a candidate for cisplatin due to his CKD and CHF. I changed him to second-line chemo with FOLFOX q2weeks and added immunotherapy  Durvalumab (Imfinzi) q3-4weeks starting 04/05/21.  -Restaging CT chest and MRI abd on 06/23/21 showed stable disease in liver, small and stable lung nodules, no new lesions. Given stable disease, I will continue current therapy -He is tolerating treatment very well with only fatigue and weight loss/fluctuation. -Continue FOLFOX q2weeks and Imfinzi q4weeks. Due to his cytopenias, I have reduced his chemo dose  -F/u in 2 weeks.     2. HTN, HLD, Gout, LE edema -On Metformin, amlodipine, lisinopril., allopurinol, Lasix. Continue to f/u with his PCP and cardiologist. He is now on Lasix three days a week. -His PCP has started him on Janumet for his feet burning.    3. AF and CHF -With PAC placement on 02/23/20 he was found to have Afib. Based on CHADS2 Score 2. He is at moderate risk for stroke.  -He underwent Cardioversion with Dr. Meda Coffee on 04/27/20. After CHF in late 04/2020 he had another cardioversion on 05/19/20. He has recovered well.  -His Afib is intermittent. He will continue his medications and f/u with cardiologist. He is on Eliquis    4. Nicotine Use -He never smoked but has been chewing Tobacco for the past 60 years. He no longer drinks alcohol.  -He currently chews 1-2 times a day. I have repeatedly discussed and encouraged tobacco cessation.    5. GERD and gastritis, history of esophageal candidiasis, DM  -He had repeated EGD with Dr. Havery Moros in 07/2019. 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 -Will monitor and may repeat EGD in future  -He is not currently on DM medication. I encouraged him to watch his diet and reduce sugar.  PLAN:  -Proceed with C9 FOLFOX, keeping reduced oxaliplatin and 5-fu dose as last cycle, and Imfinzi today -Lab, flush, f/u, and FOLFOX every 2 weeks x3, with Imfinzi every 4 weeks    No problem-specific Assessment & Plan notes found for this  encounter.   SUMMARY OF ONCOLOGIC HISTORY: Oncology History Overview Note  Cancer Staging Intrahepatic cholangiocarcinoma ([place]) Staging form: Intrahepatic Bile Duct, AJCC 8th Edition - Clinical stage from 08/19/2019: Stage II (cT2, cN0, cM0) - Signed by [name], MD on 08/19/2019    Intrahepatic cholangiocarcinoma ([place])  06/28/2019 Imaging   CT AP W Contrast 06/28/19  IMPRESSION: 1. Heterogeneous hypodensity posteriorly in the right hepatic lobe and potentially extending into the caudate lobe suspicious for a mass. There is felt to be truncation of branches of the portal vein in this vicinity and some narrowing of the hepatic vein, as well as triangular-shaped regions of abnormal hypoenhancement posteriorly in the right hepatic lobe likely representing downstream vascular effects. Cannot exclude malignancy such as cholangiocarcinoma or hepatocellular carcinoma, and follow up hepatic protocol MRI with and without contrast is recommended to further characterize. 2. 4 mm right middle lobe pulmonary nodule is likely benign but may merit surveillance. 3. Cholelithiasis. 4.  Aortic Atherosclerosis (ICD10-I70.0). 5. Prostatomegaly. 6. Mild impingement at L3-4 and L4-5.   07/31/2019 Imaging   MRI Liver 07/31/19 IMPRESSION: 1. 7.3 cm in long axis mass in the right hepatic lobe spanning into the caudate lobe, high suspicion for malignancy such as hepatocellular carcinoma or cholangiocarcinoma. Suspected effacement or occlusion of the right hepatic vein and posterior branches of the right portal vein. Two smaller tumor nodules along the posterior periphery of the dominant mass. Tissue diagnosis is recommended. 2. No findings of pathologic adenopathy or distant metastatic spread. 3. 9 mm gallstone in the gallbladder. There is mild gallbladder wall thickening which may be from nondistention, correlate clinically in assessing for cholecystitis. 4.  Aortic Atherosclerosis (ICD10-I70.0). 5. Mild  diffuse hepatic steatosis.   08/11/2019 Initial Biopsy   DIAGNOSIS: 08/11/19  A. LIVER, RIGHT, BIOPSY:  - Adenocarcinoma.   08/18/2019 Imaging   CT Chest 08/18/19  IMPRESSION: 1. Multiple pulmonary nodules largest at approximately 7 mm in the right lower lobe, nonspecific but concerning given findings in the liver. 2. No signs of definitive metastatic disease, also with mildly enlarged upper abdominal lymph nodes as discussed.   Aortic Atherosclerosis (ICD10-I70.0).   08/19/2019 Initial Diagnosis   Intrahepatic cholangiocarcinoma ([place])   08/19/2019 Cancer Staging   Staging form: Intrahepatic Bile Duct, AJCC 8th Edition - Clinical stage from 08/19/2019: Stage II (cT2, cN0, cM0) - Signed by [name], MD on 08/19/2019   09/29/2019 - 07/19/2020 Chemotherapy   Cisplatin and Gemcitabine 2 weeks on/1 week off starting 09/29/19. Cisplatin held from cycle 9 (03/23/20) due to fluid status/Afib. He is now on maintenance Gemcitabine. Held after 07/19/20 to proceed with liver target therapy.     10/09/2019 Imaging   CT AP IMPRESSION: 1. The dominant right hepatic lobe mass is minimally reduced in size compared to prior exams, currently measuring 6.2 by 5.3 cm, previously 6.2 by 5.6 cm. However, there is a new small hypodense lesion centrally in the right hepatic lobe which is suspicious for a new small focus of tumor. Accordingly this is an overall mixed appearance. 2. Continued hypoenhancement in the liver downstream of the tumor likely attributable to narrowing or occlusion of the right hepatic vein by the tumor. By virtue of its location the tumor wraps around the intrahepatic portion  of the IVC. 3. 4 mm right middle lobe pulmonary nodule, stable compared to earliest available comparison of 07/18/2019. Surveillance of the patient's pulmonary nodules is recommended. 4. Other imaging findings of potential clinical significance: Coronary atherosclerosis. Cholelithiasis. Prominent stool throughout the colon  favors constipation. Moderate prostatomegaly with heterogeneous enhancement of the prostate gland. Lumbar spondylosis and degenerative disc disease causing mild bilateral foraminal impingement at L3-4 and L4-5.   Aortic Atherosclerosis (ICD10-I70.0).   12/24/2019 Imaging   CT CAP W Contrast  IMPRESSION: 1. Right hepatic lobe mass and adjacent right hepatic lobe nodules appear grossly stable. No evidence of distant metastatic disease. 2. Continued stability of small pulmonary nodules. Recommend attention on follow-up. 3. Cholelithiasis. 4. Enlarged prostate. 5. Aortic atherosclerosis (ICD10-I70.0). Coronary artery calcification.   02/23/2020 Procedure   He had PAC placed on 02/23/20.    04/08/2020 Imaging   CT CAP  IMPRESSION: 1. Stable or minimally decreased size of a very ill-defined, hypodense and somewhat retractile appearing mass of the central right lobe of the liver abutting the inferior vena cava and right portal vein, measuring approximately 5.7 x 5.0 cm, previously 6.2 x 5.2 cm when measured similarly. Findings are consistent with stable or minimally improved cholangiocarcinoma. 2. Small hypodense nodules of the right lobe identified on prior examination are poorly appreciated on this single phase contrast examination although not grossly changed. Attention on follow-up. 3. There are new, moderate bilateral pleural effusions and associated atelectasis or consolidation as well as a new small pericardial effusion, nonspecific although generally concerning and suspicious for malignant effusions. There is no directly visualized pleural nodularity. 4. Multiple small pulmonary nodules are stable.  No new nodules. 5. Coronary artery disease. Aortic Atherosclerosis (ICD10-I70.0).   07/06/2020 Imaging   MRI ABD IMPRESSION: 1. Interval decrease in size of the right hepatic lobe lesion in the medial aspect of the segment 7. Findings would suggest a good response to treatment with  some contraction of the tumor. 2. No new hepatic lesions. No abdominal adenopathy or metastatic disease. 3. Stable mild intrahepatic biliary dilatation in the right hepatic lobe distal to the lesion. 4. Somewhat tortuous and almost beaded appearance of the hepatic and portal vein radicles. Findings could be radiation related. 5. Cholelithiasis.  CT chest wo contrast IMPRESSION: 1. Persistent/stable moderate-sized bilateral pleural effusions with overlying atelectasis. 2. Stable small right pulmonary nodules. No new or progressive findings. Recommend continued surveillance. 3. No mediastinal or hilar mass or adenopathy. 4. Stable advanced three-vessel coronary artery calcifications. 5. Cholelithiasis. Aortic Atherosclerosis (ICD10-I70.0)   08/11/2020 Procedure   Y90 on 08/11/20 and 08/26/20 with Dr Laurence Ferrari    11/30/2020 Imaging   MRI Abdomen  IMPRESSION: 1. Today's study demonstrates progression of disease with enlarging central lesion in the right lobe of the liver involving portions of segments 7 and 8, now with evidence of some tumor thrombus extension into the intrahepatic portion of the inferior vena cava. This is associated with increasing intrahepatic biliary ductal dilatation in segment 7 of the liver. 2. In addition, there is some subtle hyperenhancement of the distal common bile duct at and immediately proximally to the level of the ampulla. There is also some subtle delayed enhancement around this region in the pancreatic head. This is of uncertain etiology and significance, but may be inflammatory and currently is not associated with proximal common bile duct dilatation. However, close attention on follow-up imaging is recommended. 3. Cholelithiasis without evidence of acute cholecystitis at this time. 4. Aortic atherosclerosis.     12/24/2020 -  03/21/2021 Chemotherapy   Restart Gemcitabine 2 weeks on/1 week off given disease progression beginning 12/24/20. Discontinued after  03/21/21 due to disease progression in liver.     03/16/2021 Imaging   MRI abdomen  IMPRESSION: No significant change in size of irregular hypovascular mass in the medial right hepatic lobe.   New 1.1 cm hypovascular lesion with peripheral rim enhancement in liver dome, suspicious for metastatic disease.   New small right pleural effusion and mild ascites.   Cholelithiasis, without evidence of cholecystitis or biliary dilatation.   04/05/2021 -  Chemotherapy   Second-line FOLFOX q2weeks starting 04/05/21    04/05/2021 -  Chemotherapy   Immunotherapy with Durvalumab (Imfinzi) q4weeks starting 04/05/21. (in addition to second line FOLFOX)   06/23/2021 Imaging   MRI Abdomen IMPRESSION: 1. No significant change in subcapsular mass of the posterior right lobe of the liver. 2. No significant change in an additional small hyperenhancing lesion of the liver dome, which remains suspicious for a satellite lesion. 3. No new liver lesions. 4. 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 5. Cholelithiasis without evidence of acute cholecystitis. 6. Trace ascites.  CT Chest w/o contrast IMPRESSION: 1. Occasional small pulmonary nodules in the right lung are stable, for example a 6 mm nodule right lower lobe and a 3 mm nodule of the lateral segment right middle lobe. These are nonspecific and likely benign, incidental sequelae of infection or inflammation, metastatic disease not favored. Attention on follow-up. 2. Previously seen bilateral pleural effusions are resolved. 3. Hypodense lesion of the posterior right lobe of the liver, keeping with known cholangiocarcinoma and better characterized by same day MR. 4. Coronary artery disease.      INTERVAL HISTORY:  Brandon Sherman is here for a follow up of cholangiocarcinoma. He was last seen by me on 07/11/21. He presents to the clinic alone. He notes his wife is also at a doctor's appointment this morning. He reports his only  complaint is fatigue. He denies any bowel issues, cough or shortness of breath. He does note some numbness in his fingers that resolves after several days.   All other systems were reviewed with the patient and are negative.  MEDICAL HISTORY:  Past Medical History:  Diagnosis Date   Arthritis    Diabetes ([place])    GERD (gastroesophageal reflux disease)    Hyperlipidemia    Hypertension    Intrahepatic cholangiocarcinoma ([place])     SURGICAL HISTORY: Past Surgical History:  Procedure Laterality Date   APPENDECTOMY  1980   CARDIOVERSION N/A 04/27/2020   Procedure: CARDIOVERSION;  Surgeon: Dorothy Spark, MD;  Location: [facility];  Service: Cardiovascular;  Laterality: N/A;   CARDIOVERSION N/A 05/19/2020   Procedure: CARDIOVERSION;  Surgeon: Elouise Munroe, MD;  Location: [facility];  Service: Cardiovascular;  Laterality: N/A;   COLONOSCOPY     IR 3D INDEPENDENT WKST  08/11/2020   IR 3D INDEPENDENT WKST  08/11/2020   IR ANGIOGRAM SELECTIVE EACH ADDITIONAL VESSEL  08/11/2020   IR ANGIOGRAM SELECTIVE EACH ADDITIONAL VESSEL  08/11/2020   IR ANGIOGRAM SELECTIVE EACH ADDITIONAL VESSEL  08/11/2020   IR ANGIOGRAM SELECTIVE EACH ADDITIONAL VESSEL  08/11/2020   IR ANGIOGRAM SELECTIVE EACH ADDITIONAL VESSEL  08/11/2020   IR ANGIOGRAM SELECTIVE EACH ADDITIONAL VESSEL  08/11/2020   IR ANGIOGRAM SELECTIVE EACH ADDITIONAL VESSEL  08/26/2020   IR ANGIOGRAM SELECTIVE EACH ADDITIONAL VESSEL  08/26/2020   IR ANGIOGRAM VISCERAL SELECTIVE  08/11/2020   IR ANGIOGRAM VISCERAL SELECTIVE  08/26/2020  IR EMBO ARTERIAL NOT HEMORR HEMANG INC GUIDE ROADMAPPING  08/11/2020   IR EMBO TUMOR ORGAN ISCHEMIA INFARCT INC GUIDE ROADMAPPING  08/26/2020   IR IMAGING GUIDED PORT INSERTION  02/23/2020   IR RADIOLOGIST EVAL & MGMT  07/14/2020   IR RADIOLOGIST EVAL & MGMT  09/30/2020   IR RADIOLOGIST EVAL & MGMT  12/01/2020   IR US GUIDE VASC ACCESS LEFT  08/26/2020   IR US GUIDE VASC ACCESS RIGHT  08/11/2020    I have  reviewed the social history and family history with the patient and they are unchanged from previous note.  ALLERGIES:  has No Known Allergies.  MEDICATIONS:  Current Outpatient Medications  Medication Sig Dispense Refill   allopurinol (ZYLOPRIM) 100 MG tablet Take 100 mg by mouth daily.     colchicine 0.6 MG tablet Take 1 tablet (0.6 mg total) by mouth daily as needed (gout flare). Take daily for 3 days, then as needed for gout flare 30 tablet 0   diltiazem (CARDIZEM CD) 240 MG 24 hr capsule TAKE 1 CAPSULE BY MOUTH EVERY DAY, STOP AMLODIPINE 90 capsule 3   ELIQUIS 5 MG TABS tablet TAKE 1 TABLET TWICE DAILY 180 tablet 1   fenofibrate (TRICOR) 145 MG tablet Take 145 mg by mouth daily.     finasteride (PROSCAR) 5 MG tablet Take 5 mg by mouth daily.     furosemide (LASIX) 40 MG tablet Take 40 mg twice a day 90 tablet 3   gabapentin (NEURONTIN) 100 MG capsule Take 100 mg by mouth at bedtime.     hydrALAZINE (APRESOLINE) 50 MG tablet TAKE 2 TABLETS BY MOUTH IN THE MORNING, 1 TABLET AT LUNCH AND 2 IN THE EVENING 450 tablet 1   hydrALAZINE (APRESOLINE) 50 MG tablet Take 50 mg by mouth 2 (two) times daily.     KLOR-CON M20 20 MEQ tablet TAKE 1 TABLET BY MOUTH TWICE A DAY 180 tablet 3   lidocaine-prilocaine (EMLA) cream Apply 1 application topically as needed. 30 g 3   metoprolol succinate (TOPROL XL) 25 MG 24 hr tablet Take 1 tablet (25 mg total) by mouth daily. 90 tablet 3   metoprolol tartrate (LOPRESSOR) 50 MG tablet      pantoprazole (PROTONIX) 40 MG tablet TAKE 1 TABLET BY MOUTH TWICE A DAY 180 tablet 2   pravastatin (PRAVACHOL) 40 MG tablet Take 40 mg by mouth daily.     sucralfate (CARAFATE) 1 g tablet Take 1 tablet (1 g total) by mouth every 6 (six) hours as needed. Please schedule a yearly follow up for further refills: [phone] 60 tablet 0   tamsulosin (FLOMAX) 0.4 MG CAPS capsule Take 0.4 mg by mouth daily.     No current facility-administered medications for this visit.    Facility-Administered Medications Ordered in Other Visits  Medication Dose Route Frequency Provider Last Rate Last Admin   fluorouracil (ADRUCIL) 4,400 mg in sodium chloride 0.9 % 62 mL chemo infusion  2,000 mg/m2 (Treatment Plan Recorded) Intravenous 1 day or 1 dose [name], MD        PHYSICAL EXAMINATION: ECOG PERFORMANCE STATUS: 1 - Symptomatic but completely ambulatory  Vitals:   07/26/21 1028  BP: 110/73  Pulse: 68  Resp: 18  Temp: 98.2 F (36.8 C)  SpO2: 99%   Wt Readings from Last 3 Encounters:  07/26/21 212 lb 12.8 oz (96.5 kg)  07/11/21 216 lb 8 oz (98.2 kg)  06/27/21 213 lb 8 oz (96.8 kg)     GENERAL:alert, no  distress and comfortable SKIN: skin color normal, no rashes or significant lesions EYES: normal, Conjunctiva are pink and non-injected, sclera clear  NEURO: alert & oriented x 3 with fluent speech  LABORATORY DATA:  I have reviewed the data as listed CBC Latest Ref Rng & Units 07/26/2021 07/11/2021 06/27/2021  WBC 4.0 - 10.5 K/uL 7.2 7.2 2.9(L)  Hemoglobin 13.0 - 17.0 g/dL 11.3(L) 10.9(L) 11.2(L)  Hematocrit 39.0 - 52.0 % 34.9(L) 33.6(L) 33.7(L)  Platelets 150 - 400 K/uL 125(L) 73(L) 112(L)     CMP Latest Ref Rng & Units 07/26/2021 07/11/2021 06/27/2021  Glucose 70 - 99 mg/dL 118(H) 130(H) 155(H)  BUN 8 - 23 mg/dL 20 16 22   Creatinine 0.61 - 1.24 mg/dL 1.53(H) 1.59(H) 1.65(H)  Sodium 135 - 145 mmol/L 139 138 140  Potassium 3.5 - 5.1 mmol/L 4.3 4.3 3.9  Chloride 98 - 111 mmol/L 108 108 108  CO2 22 - 32 mmol/L 21(L) 21(L) 22  Calcium 8.9 - 10.3 mg/dL 9.8 9.4 9.5  Total Protein 6.5 - 8.1 g/dL 7.0 7.1 7.0  Total Bilirubin 0.3 - 1.2 mg/dL 0.5 0.6 0.4  Alkaline Phos 38 - 126 U/L 102 105 64  AST 15 - 41 U/L 31 41 29  ALT 0 - 44 U/L 20 20 20       RADIOGRAPHIC STUDIES: I have personally reviewed the radiological images as listed and agreed with the findings in the report. No results found.    No orders of the defined types were placed in this  encounter.  All questions were answered. The patient knows to call the clinic with any problems, questions or concerns. No barriers to learning was detected. The total time spent in the appointment was 30 minutes.     [name], MD 07/26/2021   I, Wilburn Mylar, am acting as scribe for [name], MD.   I have reviewed the above documentation for accuracy and completeness, and I agree with the above.

## 2021-07-26 NOTE — Progress Notes (Signed)
Ok to treat with elevated SCR per Dr.Feng

## 2021-07-27 ENCOUNTER — Telehealth: Payer: Self-pay | Admitting: Hematology

## 2021-07-27 LAB — T4: T4, Total: 12.3 ug/dL — ABNORMAL HIGH (ref 4.5–12.0)

## 2021-07-27 NOTE — Telephone Encounter (Signed)
Left message with follow-up appointments per 9/6 los. 

## 2021-07-28 ENCOUNTER — Inpatient Hospital Stay: Payer: Medicare HMO

## 2021-07-28 ENCOUNTER — Other Ambulatory Visit: Payer: Self-pay

## 2021-07-28 VITALS — BP 140/72 | HR 71 | Temp 98.4°F | Resp 18

## 2021-07-28 DIAGNOSIS — Z79899 Other long term (current) drug therapy: Secondary | ICD-10-CM | POA: Diagnosis not present

## 2021-07-28 DIAGNOSIS — Z452 Encounter for adjustment and management of vascular access device: Secondary | ICD-10-CM | POA: Diagnosis not present

## 2021-07-28 DIAGNOSIS — C221 Intrahepatic bile duct carcinoma: Secondary | ICD-10-CM

## 2021-07-28 DIAGNOSIS — Z5111 Encounter for antineoplastic chemotherapy: Secondary | ICD-10-CM | POA: Diagnosis not present

## 2021-07-28 DIAGNOSIS — Z5112 Encounter for antineoplastic immunotherapy: Secondary | ICD-10-CM | POA: Diagnosis not present

## 2021-07-28 DIAGNOSIS — Z7189 Other specified counseling: Secondary | ICD-10-CM

## 2021-07-28 MED ORDER — PEGFILGRASTIM-JMDB 6 MG/0.6ML ~~LOC~~ SOSY
6.0000 mg | PREFILLED_SYRINGE | Freq: Once | SUBCUTANEOUS | Status: AC
  Administered 2021-07-28: 6 mg via SUBCUTANEOUS
  Filled 2021-07-28: qty 0.6

## 2021-07-28 MED ORDER — SODIUM CHLORIDE 0.9% FLUSH
10.0000 mL | INTRAVENOUS | Status: DC | PRN
  Administered 2021-07-28: 10 mL

## 2021-07-28 MED ORDER — HEPARIN SOD (PORK) LOCK FLUSH 100 UNIT/ML IV SOLN
500.0000 [IU] | Freq: Once | INTRAVENOUS | Status: AC | PRN
  Administered 2021-07-28: 500 [IU]

## 2021-08-04 DIAGNOSIS — C221 Intrahepatic bile duct carcinoma: Secondary | ICD-10-CM | POA: Diagnosis not present

## 2021-08-04 DIAGNOSIS — Z23 Encounter for immunization: Secondary | ICD-10-CM | POA: Diagnosis not present

## 2021-08-04 DIAGNOSIS — E559 Vitamin D deficiency, unspecified: Secondary | ICD-10-CM | POA: Diagnosis not present

## 2021-08-04 DIAGNOSIS — E785 Hyperlipidemia, unspecified: Secondary | ICD-10-CM | POA: Diagnosis not present

## 2021-08-04 DIAGNOSIS — N182 Chronic kidney disease, stage 2 (mild): Secondary | ICD-10-CM | POA: Diagnosis not present

## 2021-08-04 DIAGNOSIS — I4891 Unspecified atrial fibrillation: Secondary | ICD-10-CM | POA: Diagnosis not present

## 2021-08-04 DIAGNOSIS — Z79899 Other long term (current) drug therapy: Secondary | ICD-10-CM | POA: Diagnosis not present

## 2021-08-04 DIAGNOSIS — I129 Hypertensive chronic kidney disease with stage 1 through stage 4 chronic kidney disease, or unspecified chronic kidney disease: Secondary | ICD-10-CM | POA: Diagnosis not present

## 2021-08-04 DIAGNOSIS — E1122 Type 2 diabetes mellitus with diabetic chronic kidney disease: Secondary | ICD-10-CM | POA: Diagnosis not present

## 2021-08-04 DIAGNOSIS — I10 Essential (primary) hypertension: Secondary | ICD-10-CM | POA: Diagnosis not present

## 2021-08-04 DIAGNOSIS — K219 Gastro-esophageal reflux disease without esophagitis: Secondary | ICD-10-CM | POA: Diagnosis not present

## 2021-08-04 DIAGNOSIS — M109 Gout, unspecified: Secondary | ICD-10-CM | POA: Diagnosis not present

## 2021-08-06 DIAGNOSIS — C221 Intrahepatic bile duct carcinoma: Secondary | ICD-10-CM | POA: Diagnosis not present

## 2021-08-08 ENCOUNTER — Inpatient Hospital Stay: Payer: Medicare HMO

## 2021-08-08 ENCOUNTER — Encounter: Payer: Self-pay | Admitting: Hematology

## 2021-08-08 ENCOUNTER — Inpatient Hospital Stay: Payer: Medicare HMO | Admitting: Hematology

## 2021-08-08 ENCOUNTER — Other Ambulatory Visit: Payer: Self-pay

## 2021-08-08 VITALS — BP 130/80 | HR 62 | Temp 98.1°F | Resp 18 | Ht 69.0 in | Wt 218.3 lb

## 2021-08-08 DIAGNOSIS — C221 Intrahepatic bile duct carcinoma: Secondary | ICD-10-CM

## 2021-08-08 DIAGNOSIS — Z79899 Other long term (current) drug therapy: Secondary | ICD-10-CM | POA: Diagnosis not present

## 2021-08-08 DIAGNOSIS — Z452 Encounter for adjustment and management of vascular access device: Secondary | ICD-10-CM | POA: Diagnosis not present

## 2021-08-08 DIAGNOSIS — Z5112 Encounter for antineoplastic immunotherapy: Secondary | ICD-10-CM | POA: Diagnosis not present

## 2021-08-08 DIAGNOSIS — Z5111 Encounter for antineoplastic chemotherapy: Secondary | ICD-10-CM | POA: Diagnosis not present

## 2021-08-08 DIAGNOSIS — Z7189 Other specified counseling: Secondary | ICD-10-CM

## 2021-08-08 LAB — CBC WITH DIFFERENTIAL (CANCER CENTER ONLY)
Abs Immature Granulocytes: 0.1 10*3/uL — ABNORMAL HIGH (ref 0.00–0.07)
Basophils Absolute: 0 10*3/uL (ref 0.0–0.1)
Basophils Relative: 0 %
Eosinophils Absolute: 0.1 10*3/uL (ref 0.0–0.5)
Eosinophils Relative: 1 %
HCT: 32.4 % — ABNORMAL LOW (ref 39.0–52.0)
Hemoglobin: 10.5 g/dL — ABNORMAL LOW (ref 13.0–17.0)
Immature Granulocytes: 1 %
Lymphocytes Relative: 11 %
Lymphs Abs: 0.8 10*3/uL (ref 0.7–4.0)
MCH: 34.8 pg — ABNORMAL HIGH (ref 26.0–34.0)
MCHC: 32.4 g/dL (ref 30.0–36.0)
MCV: 107.3 fL — ABNORMAL HIGH (ref 80.0–100.0)
Monocytes Absolute: 0.8 10*3/uL (ref 0.1–1.0)
Monocytes Relative: 11 %
Neutro Abs: 5.5 10*3/uL (ref 1.7–7.7)
Neutrophils Relative %: 76 %
Platelet Count: 91 10*3/uL — ABNORMAL LOW (ref 150–400)
RBC: 3.02 MIL/uL — ABNORMAL LOW (ref 4.22–5.81)
RDW: 20.3 % — ABNORMAL HIGH (ref 11.5–15.5)
WBC Count: 7.3 10*3/uL (ref 4.0–10.5)
nRBC: 0 % (ref 0.0–0.2)

## 2021-08-08 LAB — CMP (CANCER CENTER ONLY)
ALT: 24 U/L (ref 0–44)
AST: 35 U/L (ref 15–41)
Albumin: 3.4 g/dL — ABNORMAL LOW (ref 3.5–5.0)
Alkaline Phosphatase: 130 U/L — ABNORMAL HIGH (ref 38–126)
Anion gap: 8 (ref 5–15)
BUN: 15 mg/dL (ref 8–23)
CO2: 21 mmol/L — ABNORMAL LOW (ref 22–32)
Calcium: 9.2 mg/dL (ref 8.9–10.3)
Chloride: 109 mmol/L (ref 98–111)
Creatinine: 1.48 mg/dL — ABNORMAL HIGH (ref 0.61–1.24)
GFR, Estimated: 50 mL/min — ABNORMAL LOW (ref >60)
Glucose, Bld: 139 mg/dL — ABNORMAL HIGH (ref 70–99)
Potassium: 4 mmol/L (ref 3.5–5.1)
Sodium: 138 mmol/L (ref 135–145)
Total Bilirubin: 0.5 mg/dL (ref 0.3–1.2)
Total Protein: 6.7 g/dL (ref 6.5–8.1)

## 2021-08-08 LAB — MAGNESIUM: Magnesium: 1.7 mg/dL (ref 1.7–2.4)

## 2021-08-08 MED ORDER — DEXTROSE 5 % IV SOLN: Freq: Once | INTRAVENOUS | Status: AC

## 2021-08-08 MED ORDER — PALONOSETRON HCL INJECTION 0.25 MG/5ML
0.2500 mg | Freq: Once | INTRAVENOUS | Status: AC
  Administered 2021-08-08: 0.25 mg via INTRAVENOUS
  Filled 2021-08-08: qty 5

## 2021-08-08 MED ORDER — DEXTROSE 5 % IV SOLN
50.0000 mg/m2 | Freq: Once | INTRAVENOUS | Status: AC
  Administered 2021-08-08: 110 mg via INTRAVENOUS
  Filled 2021-08-08: qty 20

## 2021-08-08 MED ORDER — LEUCOVORIN CALCIUM INJECTION 350 MG
400.0000 mg/m2 | Freq: Once | INTRAVENOUS | Status: AC
  Administered 2021-08-08: 880 mg via INTRAVENOUS
  Filled 2021-08-08: qty 44

## 2021-08-08 MED ORDER — SODIUM CHLORIDE 0.9 % IV SOLN
2000.0000 mg/m2 | INTRAVENOUS | Status: DC
  Administered 2021-08-08: 4400 mg via INTRAVENOUS
  Filled 2021-08-08: qty 88

## 2021-08-08 NOTE — Patient Instructions (Signed)
Implanted Port Home Guide An implanted port is a device that is placed under the skin. It is usually placed in the chest. The device can be used to give IV medicine, to take blood, or for dialysis. You may have an implanted port if: You need IV medicine that would be irritating to the small veins in your hands or arms. You need IV medicines, such as antibiotics, for a long period of time. You need IV nutrition for a long period of time. You need dialysis. When you have a port, your health care provider can choose to use the port instead of veins in your arms for these procedures. You may have fewer limitations when using a port than you would if you used other types of long-term IVs, and you will likely be able to return to normal activities after your incision heals. An implanted port has two main parts: Reservoir. The reservoir is the part where a needle is inserted to give medicines or draw blood. The reservoir is round. After it is placed, it appears as a small, raised area under your skin. Catheter. The catheter is a thin, flexible tube that connects the reservoir to a vein. Medicine that is inserted into the reservoir goes into the catheter and then into the vein. How is my port accessed? To access your port: A numbing cream may be placed on the skin over the port site. Your health care provider will put on a mask and sterile gloves. The skin over your port will be cleaned carefully with a germ-killing soap and allowed to dry. Your health care provider will gently pinch the port and insert a needle into it. Your health care provider will check for a blood return to make sure the port is in the vein and is not clogged. If your port needs to remain accessed to get medicine continuously (constant infusion), your health care provider will place a clear bandage (dressing) over the needle site. The dressing and needle will need to be changed every week, or as told by your health care provider. What  is flushing? Flushing helps keep the port from getting clogged. Follow instructions from your health care provider about how and when to flush the port. Ports are usually flushed with saline solution or a medicine called heparin. The need for flushing will depend on how the port is used: If the port is only used from time to time to give medicines or draw blood, the port may need to be flushed: Before and after medicines have been given. Before and after blood has been drawn. As part of routine maintenance. Flushing may be recommended every 4-6 weeks. If a constant infusion is running, the port may not need to be flushed. Throw away any syringes in a disposal container that is meant for sharp items (sharps container). You can buy a sharps container from a pharmacy, or you can make one by using an empty hard plastic bottle with a cover. How long will my port stay implanted? The port can stay in for as long as your health care provider thinks it is needed. When it is time for the port to come out, a surgery will be done to remove it. The surgery will be similar to the procedure that was done to put the port in. Follow these instructions at home:  Flush your port as told by your health care provider. If you need an infusion over several days, follow instructions from your health care provider about how   to take care of your port site. Make sure you: Wash your hands with soap and water before you change your dressing. If soap and water are not available, use alcohol-based hand sanitizer. Change your dressing as told by your health care provider. Place any used dressings or infusion bags into a plastic bag. Throw that bag in the trash. Keep the dressing that covers the needle clean and dry. Do not get it wet. Do not use scissors or sharp objects near the tube. Keep the tube clamped, unless it is being used. Check your port site every day for signs of infection. Check for: Redness, swelling, or  pain. Fluid or blood. Pus or a bad smell. Protect the skin around the port site. Avoid wearing bra straps that rub or irritate the site. Protect the skin around your port from seat belts. Place a soft pad over your chest if needed. Bathe or shower as told by your health care provider. The site may get wet as long as you are not actively receiving an infusion. Return to your normal activities as told by your health care provider. Ask your health care provider what activities are safe for you. Carry a medical alert card or wear a medical alert bracelet at all times. This will let health care providers know that you have an implanted port in case of an emergency. Get help right away if: You have redness, swelling, or pain at the port site. You have fluid or blood coming from your port site. You have pus or a bad smell coming from the port site. You have a fever. Summary Implanted ports are usually placed in the chest for long-term IV access. Follow instructions from your health care provider about flushing the port and changing bandages (dressings). Take care of the area around your port by avoiding clothing that puts pressure on the area, and by watching for signs of infection. Protect the skin around your port from seat belts. Place a soft pad over your chest if needed. Get help right away if you have a fever or you have redness, swelling, pain, drainage, or a bad smell at the port site. This information is not intended to replace advice given to you by your health care provider. Make sure you discuss any questions you have with your health care provider. Document Revised: 01/26/2021 Document Reviewed: 03/22/2020 [employer] Patient Education  2022 [employer]  

## 2021-08-08 NOTE — Progress Notes (Signed)
Brandon Sherman   [phone] Fax:(336) [phone]   Clinic Follow up Note   Patient Care Team: [name], PA as PCP - General (Family Medicine) Lorretta Harp, MD as PCP - Cardiology (Cardiology) [name], MD as Consulting Physician (General Surgery) [name], [name], MD as Consulting Physician (Gastroenterology) [name], MD as Consulting Physician (Hematology) Lorretta Harp, MD as Consulting Physician (Cardiology)  Date of Service:  08/08/2021  CHIEF COMPLAINT: f/u of cholangiocarcinoma   CURRENT THERAPY:  Second-line FOLFOX q2weeks starting 04/05/21 Additional Durvalumab (Imfinzi) q4weeks starting 04/05/21.  ASSESSMENT & PLAN:  Brandon Sherman is a 75 y.o. male with   1. Intrahepatic cholangiocarcinoma, cT2N0M0, stage II, unresectable, with indeterminate lung nodules  -He was diagnosed in 07/2019. CT scans and MRI liver show a large 7.3cm mass in the right hepatic lobe which abuts portal vein. -He started IV Cisplatin and Gemcitabine 2 weeks on/1 week off on 09/29/19. Cisplatin was held since C9 (03/23/20) due to fluid status/AFib. He continued maintenance Gemcitabine until 07/19/20. He proceeded with Y90 on 08/11/20 and 08/26/20. -His MRI Abdomen from 12/11/20 showed progression of disease in liver with tumor thrombus extension into IVC. Given disease progression, Dr Geroge Baseman does not recommend more Y90 or liver target therapy at this point.  -To control his disease I restarted Single agent Gemcitabine 2 weeks on/1 week off beginning 12/24/20.  -He has IDH-1 mutation, which makes him eligible for an oral IDH inhibitor drug Ivosidenib.  -Based on 03/16/21 MRI he progressed with New 1.1 cm hypovascular lesion suspicious for metastatic disease. -We switched to second-line chemo with FOLFOX q2weeks and added immunotherapy Durvalumab (Imfinzi) q3-4weeks starting 04/05/21.  -Restaging CT chest and MRI abd on 06/23/21 showed stable disease in liver, small  and stable lung nodules, no new lesions. Given stable disease, I will continue current therapy -Continue FOLFOX q2weeks and Imfinzi q4weeks. Due to his cytopenias, I have reduced his chemo dose. He knows to watch for neuropathy symptoms. -F/u in 2 weeks.     2. HTN, HLD, Gout, LE edema -On Metformin, amlodipine, lisinopril., allopurinol, Lasix. Continue to f/u with his PCP and cardiologist. He is now on Lasix three days a week. -His PCP has started him on Janumet for his feet burning.    3. AF and CHF -With PAC placement on 02/23/20 he was found to have Afib. Based on CHADS2 Score 2. He is at moderate risk for stroke.  -He underwent Cardioversion with Dr. Meda Coffee on 04/27/20. After CHF in late 04/2020 he had another cardioversion on 05/19/20. He has recovered well.  -His Afib is intermittent. He will continue his medications and f/u with cardiologist. He is on Eliquis    4. Nicotine Use -He never smoked but has been chewing Tobacco for the past 60 years. He no longer drinks alcohol.  -He currently chews 1-2 times a day. I have repeatedly discussed and encouraged tobacco cessation.    5. GERD and gastritis, history of esophageal candidiasis, DM  -He had repeated EGD with Dr. Havery Moros in 07/2019. 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 -Will monitor and may repeat EGD in future  -He is not currently on DM medication. I encouraged him to watch his diet and reduce sugar.      PLAN:  -Proceed with C10 FOLFOX, keeping same reduced oxaliplatin and 5-fu dose -Lab, flush, f/u, and FOLFOX every 2 weeks x3, with Imfinzi in 2  and 6 weeks   No problem-specific Assessment & Plan notes found for this encounter.   SUMMARY OF ONCOLOGIC HISTORY: Oncology History Overview Note  Cancer Staging Intrahepatic cholangiocarcinoma ([place]) Staging form: Intrahepatic Bile Duct, AJCC 8th Edition - Clinical stage from  08/19/2019: Stage II (cT2, cN0, cM0) - Signed by [name], MD on 08/19/2019    Intrahepatic cholangiocarcinoma ([place])  06/28/2019 Imaging   CT AP W Contrast 06/28/19  IMPRESSION: 1. Heterogeneous hypodensity posteriorly in the right hepatic lobe and potentially extending into the caudate lobe suspicious for a mass. There is felt to be truncation of branches of the portal vein in this vicinity and some narrowing of the hepatic vein, as well as triangular-shaped regions of abnormal hypoenhancement posteriorly in the right hepatic lobe likely representing downstream vascular effects. Cannot exclude malignancy such as cholangiocarcinoma or hepatocellular carcinoma, and follow up hepatic protocol MRI with and without contrast is recommended to further characterize. 2. 4 mm right middle lobe pulmonary nodule is likely benign but may merit surveillance. 3. Cholelithiasis. 4.  Aortic Atherosclerosis (ICD10-I70.0). 5. Prostatomegaly. 6. Mild impingement at L3-4 and L4-5.   07/31/2019 Imaging   MRI Liver 07/31/19 IMPRESSION: 1. 7.3 cm in long axis mass in the right hepatic lobe spanning into the caudate lobe, high suspicion for malignancy such as hepatocellular carcinoma or cholangiocarcinoma. Suspected effacement or occlusion of the right hepatic vein and posterior branches of the right portal vein. Two smaller tumor nodules along the posterior periphery of the dominant mass. Tissue diagnosis is recommended. 2. No findings of pathologic adenopathy or distant metastatic spread. 3. 9 mm gallstone in the gallbladder. There is mild gallbladder wall thickening which may be from nondistention, correlate clinically in assessing for cholecystitis. 4.  Aortic Atherosclerosis (ICD10-I70.0). 5. Mild diffuse hepatic steatosis.   08/11/2019 Initial Biopsy   DIAGNOSIS: 08/11/19  A. LIVER, RIGHT, BIOPSY:  - Adenocarcinoma.   08/18/2019 Imaging   CT Chest 08/18/19  IMPRESSION: 1. Multiple pulmonary nodules  largest at approximately 7 mm in the right lower lobe, nonspecific but concerning given findings in the liver. 2. No signs of definitive metastatic disease, also with mildly enlarged upper abdominal lymph nodes as discussed.   Aortic Atherosclerosis (ICD10-I70.0).   08/19/2019 Initial Diagnosis   Intrahepatic cholangiocarcinoma ([place])   08/19/2019 Cancer Staging   Staging form: Intrahepatic Bile Duct, AJCC 8th Edition - Clinical stage from 08/19/2019: Stage II (cT2, cN0, cM0) - Signed by [name], MD on 08/19/2019   09/29/2019 - 07/19/2020 Chemotherapy   Cisplatin and Gemcitabine 2 weeks on/1 week off starting 09/29/19. Cisplatin held from cycle 9 (03/23/20) due to fluid status/Afib. He is now on maintenance Gemcitabine. Held after 07/19/20 to proceed with liver target therapy.     10/09/2019 Imaging   CT AP IMPRESSION: 1. The dominant right hepatic lobe mass is minimally reduced in size compared to prior exams, currently measuring 6.2 by 5.3 cm, previously 6.2 by 5.6 cm. However, there is a new small hypodense lesion centrally in the right hepatic lobe which is suspicious for a new small focus of tumor. Accordingly this is an overall mixed appearance. 2. Continued hypoenhancement in the liver downstream of the tumor likely attributable to narrowing or occlusion of the right hepatic vein by the tumor. By virtue of its location the tumor wraps around the intrahepatic portion of the IVC. 3. 4 mm right middle lobe pulmonary nodule, stable compared to earliest available comparison of 07/18/2019. Surveillance of the patient's pulmonary nodules is recommended. 4. Other imaging  findings of potential clinical significance: Coronary atherosclerosis. Cholelithiasis. Prominent stool throughout the colon favors constipation. Moderate prostatomegaly with heterogeneous enhancement of the prostate gland. Lumbar spondylosis and degenerative disc disease causing mild bilateral foraminal impingement at L3-4 and  L4-5.   Aortic Atherosclerosis (ICD10-I70.0).   12/24/2019 Imaging   CT CAP W Contrast  IMPRESSION: 1. Right hepatic lobe mass and adjacent right hepatic lobe nodules appear grossly stable. No evidence of distant metastatic disease. 2. Continued stability of small pulmonary nodules. Recommend attention on follow-up. 3. Cholelithiasis. 4. Enlarged prostate. 5. Aortic atherosclerosis (ICD10-I70.0). Coronary artery calcification.   02/23/2020 Procedure   He had PAC placed on 02/23/20.    04/08/2020 Imaging   CT CAP  IMPRESSION: 1. Stable or minimally decreased size of a very ill-defined, hypodense and somewhat retractile appearing mass of the central right lobe of the liver abutting the inferior vena cava and right portal vein, measuring approximately 5.7 x 5.0 cm, previously 6.2 x 5.2 cm when measured similarly. Findings are consistent with stable or minimally improved cholangiocarcinoma. 2. Small hypodense nodules of the right lobe identified on prior examination are poorly appreciated on this single phase contrast examination although not grossly changed. Attention on follow-up. 3. There are new, moderate bilateral pleural effusions and associated atelectasis or consolidation as well as a new small pericardial effusion, nonspecific although generally concerning and suspicious for malignant effusions. There is no directly visualized pleural nodularity. 4. Multiple small pulmonary nodules are stable.  No new nodules. 5. Coronary artery disease. Aortic Atherosclerosis (ICD10-I70.0).   07/06/2020 Imaging   MRI ABD IMPRESSION: 1. Interval decrease in size of the right hepatic lobe lesion in the medial aspect of the segment 7. Findings would suggest a good response to treatment with some contraction of the tumor. 2. No new hepatic lesions. No abdominal adenopathy or metastatic disease. 3. Stable mild intrahepatic biliary dilatation in the right hepatic lobe distal to the lesion. 4.  Somewhat tortuous and almost beaded appearance of the hepatic and portal vein radicles. Findings could be radiation related. 5. Cholelithiasis.  CT chest wo contrast IMPRESSION: 1. Persistent/stable moderate-sized bilateral pleural effusions with overlying atelectasis. 2. Stable small right pulmonary nodules. No new or progressive findings. Recommend continued surveillance. 3. No mediastinal or hilar mass or adenopathy. 4. Stable advanced three-vessel coronary artery calcifications. 5. Cholelithiasis. Aortic Atherosclerosis (ICD10-I70.0)   08/11/2020 Procedure   Y90 on 08/11/20 and 08/26/20 with Dr Laurence Ferrari    11/30/2020 Imaging   MRI Abdomen  IMPRESSION: 1. Today's study demonstrates progression of disease with enlarging central lesion in the right lobe of the liver involving portions of segments 7 and 8, now with evidence of some tumor thrombus extension into the intrahepatic portion of the inferior vena cava. This is associated with increasing intrahepatic biliary ductal dilatation in segment 7 of the liver. 2. In addition, there is some subtle hyperenhancement of the distal common bile duct at and immediately proximally to the level of the ampulla. There is also some subtle delayed enhancement around this region in the pancreatic head. This is of uncertain etiology and significance, but may be inflammatory and currently is not associated with proximal common bile duct dilatation. However, close attention on follow-up imaging is recommended. 3. Cholelithiasis without evidence of acute cholecystitis at this time. 4. Aortic atherosclerosis.     12/24/2020 - 03/21/2021 Chemotherapy   Restart Gemcitabine 2 weeks on/1 week off given disease progression beginning 12/24/20. Discontinued after 03/21/21 due to disease progression in liver.     03/16/2021  Imaging   MRI abdomen  IMPRESSION: No significant change in size of irregular hypovascular mass in the medial right hepatic lobe.   New 1.1  cm hypovascular lesion with peripheral rim enhancement in liver dome, suspicious for metastatic disease.   New small right pleural effusion and mild ascites.   Cholelithiasis, without evidence of cholecystitis or biliary dilatation.   04/05/2021 -  Chemotherapy   Second-line FOLFOX q2weeks starting 04/05/21    04/05/2021 -  Chemotherapy   Immunotherapy with Durvalumab (Imfinzi) q4weeks starting 04/05/21. (in addition to second line FOLFOX)   06/23/2021 Imaging   MRI Abdomen IMPRESSION: 1. No significant change in subcapsular mass of the posterior right lobe of the liver. 2. No significant change in an additional small hyperenhancing lesion of the liver dome, which remains suspicious for a satellite lesion. 3. No new liver lesions. 4. 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 5. Cholelithiasis without evidence of acute cholecystitis. 6. Trace ascites.  CT Chest w/o contrast IMPRESSION: 1. Occasional small pulmonary nodules in the right lung are stable, for example a 6 mm nodule right lower lobe and a 3 mm nodule of the lateral segment right middle lobe. These are nonspecific and likely benign, incidental sequelae of infection or inflammation, metastatic disease not favored. Attention on follow-up. 2. Previously seen bilateral pleural effusions are resolved. 3. Hypodense lesion of the posterior right lobe of the liver, keeping with known cholangiocarcinoma and better characterized by same day MR. 4. Coronary artery disease.      INTERVAL HISTORY:  Brandon Sherman is here for a follow up of cholangiocarcinoma. He was last seen by me on 07/26/21. He presents to the clinic accompanied by his wife. He reports doing well overall. His weight has been variable on scales at doctor offices. He and his wife note his weight fluctuates by a few pounds but has not significantly changed. He notes he weighs himself daily. He reports blurred vision that does not change with  glasses.   All other systems were reviewed with the patient and are negative.  MEDICAL HISTORY:  Past Medical History:  Diagnosis Date   Arthritis    Diabetes ([place])    GERD (gastroesophageal reflux disease)    Hyperlipidemia    Hypertension    Intrahepatic cholangiocarcinoma ([place])     SURGICAL HISTORY: Past Surgical History:  Procedure Laterality Date   APPENDECTOMY  1980   CARDIOVERSION N/A 04/27/2020   Procedure: CARDIOVERSION;  Surgeon: Dorothy Spark, MD;  Location: [facility];  Service: Cardiovascular;  Laterality: N/A;   CARDIOVERSION N/A 05/19/2020   Procedure: CARDIOVERSION;  Surgeon: Elouise Munroe, MD;  Location: [facility];  Service: Cardiovascular;  Laterality: N/A;   COLONOSCOPY     IR 3D INDEPENDENT WKST  08/11/2020   IR 3D INDEPENDENT WKST  08/11/2020   IR ANGIOGRAM SELECTIVE EACH ADDITIONAL VESSEL  08/11/2020   IR ANGIOGRAM SELECTIVE EACH ADDITIONAL VESSEL  08/11/2020   IR ANGIOGRAM SELECTIVE EACH ADDITIONAL VESSEL  08/11/2020   IR ANGIOGRAM SELECTIVE EACH ADDITIONAL VESSEL  08/11/2020   IR ANGIOGRAM SELECTIVE EACH ADDITIONAL VESSEL  08/11/2020   IR ANGIOGRAM SELECTIVE EACH ADDITIONAL VESSEL  08/11/2020   IR ANGIOGRAM SELECTIVE EACH ADDITIONAL VESSEL  08/26/2020   IR ANGIOGRAM SELECTIVE EACH ADDITIONAL VESSEL  08/26/2020   IR ANGIOGRAM VISCERAL SELECTIVE  08/11/2020   IR ANGIOGRAM VISCERAL SELECTIVE  08/26/2020   IR EMBO ARTERIAL NOT HEMORR HEMANG INC GUIDE ROADMAPPING  08/11/2020   IR EMBO TUMOR ORGAN ISCHEMIA INFARCT INC GUIDE  ROADMAPPING  08/26/2020   IR IMAGING GUIDED PORT INSERTION  02/23/2020   IR RADIOLOGIST EVAL & MGMT  07/14/2020   IR RADIOLOGIST EVAL & MGMT  09/30/2020   IR RADIOLOGIST EVAL & MGMT  12/01/2020   IR US GUIDE VASC ACCESS LEFT  08/26/2020   IR US GUIDE VASC ACCESS RIGHT  08/11/2020    I have reviewed the social history and family history with the patient and they are unchanged from previous note.  ALLERGIES:  has No Known  Allergies.  MEDICATIONS:  Current Outpatient Medications  Medication Sig Dispense Refill   allopurinol (ZYLOPRIM) 100 MG tablet Take 100 mg by mouth daily.     colchicine 0.6 MG tablet Take 1 tablet (0.6 mg total) by mouth daily as needed (gout flare). Take daily for 3 days, then as needed for gout flare 30 tablet 0   diltiazem (CARDIZEM CD) 240 MG 24 hr capsule TAKE 1 CAPSULE BY MOUTH EVERY DAY, STOP AMLODIPINE 90 capsule 3   ELIQUIS 5 MG TABS tablet TAKE 1 TABLET TWICE DAILY 180 tablet 1   fenofibrate (TRICOR) 145 MG tablet Take 145 mg by mouth daily.     finasteride (PROSCAR) 5 MG tablet Take 5 mg by mouth daily.     furosemide (LASIX) 40 MG tablet Take 40 mg twice a day 90 tablet 3   gabapentin (NEURONTIN) 100 MG capsule Take 100 mg by mouth at bedtime.     hydrALAZINE (APRESOLINE) 50 MG tablet TAKE 2 TABLETS BY MOUTH IN THE MORNING, 1 TABLET AT LUNCH AND 2 IN THE EVENING 450 tablet 1   hydrALAZINE (APRESOLINE) 50 MG tablet Take 50 mg by mouth 2 (two) times daily.     KLOR-CON M20 20 MEQ tablet TAKE 1 TABLET BY MOUTH TWICE A DAY 180 tablet 3   lidocaine-prilocaine (EMLA) cream Apply 1 application topically as needed. 30 g 3   metoprolol succinate (TOPROL XL) 25 MG 24 hr tablet Take 1 tablet (25 mg total) by mouth daily. 90 tablet 3   metoprolol tartrate (LOPRESSOR) 50 MG tablet      pantoprazole (PROTONIX) 40 MG tablet TAKE 1 TABLET BY MOUTH TWICE A DAY 180 tablet 2   pravastatin (PRAVACHOL) 40 MG tablet Take 40 mg by mouth daily.     sucralfate (CARAFATE) 1 g tablet Take 1 tablet (1 g total) by mouth every 6 (six) hours as needed. Please schedule a yearly follow up for further refills: [phone] 60 tablet 0   tamsulosin (FLOMAX) 0.4 MG CAPS capsule Take 0.4 mg by mouth daily.     No current facility-administered medications for this visit.    PHYSICAL EXAMINATION: ECOG PERFORMANCE STATUS: 1 - Symptomatic but completely ambulatory  Vitals:   08/08/21 1048  BP: 130/80  Pulse:  62  Resp: 18  Temp: 98.1 F (36.7 C)  SpO2: 100%   Wt Readings from Last 3 Encounters:  08/08/21 218 lb 4.8 oz (99 kg)  07/26/21 212 lb 12.8 oz (96.5 kg)  07/11/21 216 lb 8 oz (98.2 kg)     GENERAL:alert, no distress and comfortable SKIN: skin color normal, no rashes or significant lesions EYES: normal, Conjunctiva are pink and non-injected, sclera clear  NEURO: alert & oriented x 3 with fluent speech  LABORATORY DATA:  I have reviewed the data as listed CBC Latest Ref Rng & Units 08/08/2021 07/26/2021 07/11/2021  WBC 4.0 - 10.5 K/uL 7.3 7.2 7.2  Hemoglobin 13.0 - 17.0 g/dL 10.5(L) 11.3(L) 10.9(L)  Hematocrit 39.0 -  52.0 % 32.4(L) 34.9(L) 33.6(L)  Platelets 150 - 400 K/uL 91(L) 125(L) 73(L)     CMP Latest Ref Rng & Units 08/08/2021 07/26/2021 07/11/2021  Glucose 70 - 99 mg/dL 139(H) 118(H) 130(H)  BUN 8 - 23 mg/dL 15 20 16   Creatinine 0.61 - 1.24 mg/dL 1.48(H) 1.53(H) 1.59(H)  Sodium 135 - 145 mmol/L 138 139 138  Potassium 3.5 - 5.1 mmol/L 4.0 4.3 4.3  Chloride 98 - 111 mmol/L 109 108 108  CO2 22 - 32 mmol/L 21(L) 21(L) 21(L)  Calcium 8.9 - 10.3 mg/dL 9.2 9.8 9.4  Total Protein 6.5 - 8.1 g/dL 6.7 7.0 7.1  Total Bilirubin 0.3 - 1.2 mg/dL 0.5 0.5 0.6  Alkaline Phos 38 - 126 U/L 130(H) 102 105  AST 15 - 41 U/L 35 31 41  ALT 0 - 44 U/L 24 20 20       RADIOGRAPHIC STUDIES: I have personally reviewed the radiological images as listed and agreed with the findings in the report. No results found.    No orders of the defined types were placed in this encounter.  All questions were answered. The patient knows to call the clinic with any problems, questions or concerns. No barriers to learning was detected. The total time spent in the appointment was 30 minutes.     [name], MD 08/08/2021   I, Wilburn Mylar, am acting as scribe for [name], MD.   I have reviewed the above documentation for accuracy and completeness, and I agree with the above.

## 2021-08-08 NOTE — Progress Notes (Signed)
Per Dr. Burr Medico, okay to treat with PLT 91.

## 2021-08-08 NOTE — Patient Instructions (Signed)
[facility]COL[facility]   Discharge Instructions: Thank you for choosing [facility] to provide your oncology and hematology care.   If you have a lab appointment with the [facility], please go directly to the [facility] and check in at the registration area.   Wear comfortable clothing and clothing appropriate for easy access to any Portacath or PICC line.   We strive to give you quality time with your provider. You may need to reschedule your appointment if you arrive late (15 or more minutes).  Arriving late affects you and other patients whose appointments are after yours.  Also, if you miss three or more appointments without notifying the office, you may be dismissed from the clinic at the provider's discretion.      For prescription refill requests, have your pharmacy contact our office and allow 72 hours for refills to be completed.    Today you received the following chemotherapy and/or immunotherapy agents Oxaliplatin, Leucovorin, and Fluorouracil (FOLFOX).      To help prevent nausea and vomiting after your treatment, we encourage you to take your nausea medication as directed.  BELOW ARE SYMPTOMS THAT SHOULD BE REPORTED IMMEDIATELY: *FEVER GREATER THAN 100.4 F (38 C) OR HIGHER *CHILLS OR SWEATING *NAUSEA AND VOMITING THAT IS NOT CONTROLLED WITH YOUR NAUSEA MEDICATION *UNUSUAL SHORTNESS OF BREATH *UNUSUAL BRUISING OR BLEEDING *URINARY PROBLEMS (pain or burning when urinating, or frequent urination) *BOWEL PROBLEMS (unusual diarrhea, constipation, pain near the anus) TENDERNESS IN MOUTH AND THROAT WITH OR WITHOUT PRESENCE OF ULCERS (sore throat, sores in mouth, or a toothache) UNUSUAL RASH, SWELLING OR PAIN  UNUSUAL VAGINAL DISCHARGE OR ITCHING   Items with * indicate a potential emergency and should be followed up as soon as possible or go to the Emergency Department if any problems should occur.  Please show the CHEMOTHERAPY ALERT CARD  or IMMUNOTHERAPY ALERT CARD at check-in to the Emergency Department and triage nurse.  Should you have questions after your visit or need to cancel or reschedule your appointment, please contact [facility]  Dept: [phone]  and follow the prompts.  Office hours are 8:00 a.m. to 4:30 p.m. Monday - Friday. Please note that voicemails left after 4:00 p.m. may not be returned until the following business day.  We are closed weekends and major holidays. You have access to a nurse at all times for urgent questions. Please call the main number to the clinic Dept: [phone] and follow the prompts.   For any non-urgent questions, you may also contact your provider using MyChart. We now offer e-Visits for anyone 56 and older to request care online for non-urgent symptoms. For details visit [email or web address].   Also download the MyChart app! Go to the app store, search "MyChart", open the app, select [facility], and log in with your MyChart username and password.  Due to Covid, a mask is required upon entering the hospital/clinic. If you do not have a mask, one will be given to you upon arrival. For doctor visits, patients may have 1 support person aged 58 or older with them. For treatment visits, patients cannot have anyone with them due to current Covid guidelines and our immunocompromised population.

## 2021-08-10 ENCOUNTER — Inpatient Hospital Stay: Payer: Medicare HMO

## 2021-08-10 ENCOUNTER — Other Ambulatory Visit: Payer: Self-pay

## 2021-08-10 VITALS — BP 135/74 | HR 65 | Temp 98.1°F | Resp 18

## 2021-08-10 DIAGNOSIS — C221 Intrahepatic bile duct carcinoma: Secondary | ICD-10-CM | POA: Diagnosis not present

## 2021-08-10 DIAGNOSIS — Z5112 Encounter for antineoplastic immunotherapy: Secondary | ICD-10-CM | POA: Diagnosis not present

## 2021-08-10 DIAGNOSIS — Z79899 Other long term (current) drug therapy: Secondary | ICD-10-CM | POA: Diagnosis not present

## 2021-08-10 DIAGNOSIS — Z95828 Presence of other vascular implants and grafts: Secondary | ICD-10-CM

## 2021-08-10 DIAGNOSIS — Z5111 Encounter for antineoplastic chemotherapy: Secondary | ICD-10-CM | POA: Diagnosis not present

## 2021-08-10 DIAGNOSIS — Z452 Encounter for adjustment and management of vascular access device: Secondary | ICD-10-CM | POA: Diagnosis not present

## 2021-08-10 MED ORDER — HEPARIN SOD (PORK) LOCK FLUSH 100 UNIT/ML IV SOLN
500.0000 [IU] | Freq: Once | INTRAVENOUS | Status: AC
  Administered 2021-08-10: 500 [IU]

## 2021-08-10 MED ORDER — SODIUM CHLORIDE 0.9% FLUSH
10.0000 mL | Freq: Once | INTRAVENOUS | Status: AC
Start: 2021-08-10 — End: 2021-08-10
  Administered 2021-08-10: 10 mL

## 2021-08-21 NOTE — Progress Notes (Addendum)
Patient seen together with Milinda Cave, PA. See her note for full documentation.  Cira Rue, NP 08/22/2021

## 2021-08-22 ENCOUNTER — Inpatient Hospital Stay: Payer: Medicare HMO | Attending: Hematology

## 2021-08-22 ENCOUNTER — Inpatient Hospital Stay: Payer: Medicare HMO

## 2021-08-22 ENCOUNTER — Inpatient Hospital Stay: Payer: Medicare HMO | Admitting: Nurse Practitioner

## 2021-08-22 ENCOUNTER — Other Ambulatory Visit: Payer: Self-pay

## 2021-08-22 ENCOUNTER — Encounter: Payer: Self-pay | Admitting: Nurse Practitioner

## 2021-08-22 VITALS — BP 142/75 | HR 62 | Temp 97.6°F | Resp 18 | Ht 69.0 in | Wt 216.1 lb

## 2021-08-22 DIAGNOSIS — C221 Intrahepatic bile duct carcinoma: Secondary | ICD-10-CM

## 2021-08-22 DIAGNOSIS — H04203 Unspecified epiphora, bilateral lacrimal glands: Secondary | ICD-10-CM | POA: Diagnosis not present

## 2021-08-22 DIAGNOSIS — Z79899 Other long term (current) drug therapy: Secondary | ICD-10-CM | POA: Diagnosis not present

## 2021-08-22 DIAGNOSIS — Z7189 Other specified counseling: Secondary | ICD-10-CM

## 2021-08-22 DIAGNOSIS — Z5189 Encounter for other specified aftercare: Secondary | ICD-10-CM | POA: Diagnosis not present

## 2021-08-22 DIAGNOSIS — Z5111 Encounter for antineoplastic chemotherapy: Secondary | ICD-10-CM | POA: Insufficient documentation

## 2021-08-22 DIAGNOSIS — Z95828 Presence of other vascular implants and grafts: Secondary | ICD-10-CM

## 2021-08-22 DIAGNOSIS — Z5112 Encounter for antineoplastic immunotherapy: Secondary | ICD-10-CM | POA: Insufficient documentation

## 2021-08-22 LAB — CMP (CANCER CENTER ONLY)
ALT: 21 U/L (ref 0–44)
AST: 33 U/L (ref 15–41)
Albumin: 3.6 g/dL (ref 3.5–5.0)
Alkaline Phosphatase: 78 U/L (ref 38–126)
Anion gap: 10 (ref 5–15)
BUN: 14 mg/dL (ref 8–23)
CO2: 20 mmol/L — ABNORMAL LOW (ref 22–32)
Calcium: 9.2 mg/dL (ref 8.9–10.3)
Chloride: 108 mmol/L (ref 98–111)
Creatinine: 1.3 mg/dL — ABNORMAL HIGH (ref 0.61–1.24)
GFR, Estimated: 58 mL/min — ABNORMAL LOW
Glucose, Bld: 151 mg/dL — ABNORMAL HIGH (ref 70–99)
Potassium: 3.6 mmol/L (ref 3.5–5.1)
Sodium: 138 mmol/L (ref 135–145)
Total Bilirubin: 0.7 mg/dL (ref 0.3–1.2)
Total Protein: 6.8 g/dL (ref 6.5–8.1)

## 2021-08-22 LAB — CBC WITH DIFFERENTIAL (CANCER CENTER ONLY)
Abs Immature Granulocytes: 0.02 10*3/uL (ref 0.00–0.07)
Basophils Absolute: 0 10*3/uL (ref 0.0–0.1)
Basophils Relative: 1 %
Eosinophils Absolute: 0 10*3/uL (ref 0.0–0.5)
Eosinophils Relative: 1 %
HCT: 32.9 % — ABNORMAL LOW (ref 39.0–52.0)
Hemoglobin: 10.8 g/dL — ABNORMAL LOW (ref 13.0–17.0)
Immature Granulocytes: 1 %
Lymphocytes Relative: 13 %
Lymphs Abs: 0.4 10*3/uL — ABNORMAL LOW (ref 0.7–4.0)
MCH: 34.1 pg — ABNORMAL HIGH (ref 26.0–34.0)
MCHC: 32.8 g/dL (ref 30.0–36.0)
MCV: 103.8 fL — ABNORMAL HIGH (ref 80.0–100.0)
Monocytes Absolute: 0.7 10*3/uL (ref 0.1–1.0)
Monocytes Relative: 21 %
Neutro Abs: 2.1 10*3/uL (ref 1.7–7.7)
Neutrophils Relative %: 63 %
Platelet Count: 93 10*3/uL — ABNORMAL LOW (ref 150–400)
RBC: 3.17 MIL/uL — ABNORMAL LOW (ref 4.22–5.81)
RDW: 17.2 % — ABNORMAL HIGH (ref 11.5–15.5)
WBC Count: 3.3 10*3/uL — ABNORMAL LOW (ref 4.0–10.5)
nRBC: 0 % (ref 0.0–0.2)

## 2021-08-22 LAB — TSH: TSH: 2.66 u[IU]/mL (ref 0.320–4.118)

## 2021-08-22 LAB — MAGNESIUM: Magnesium: 1.9 mg/dL (ref 1.7–2.4)

## 2021-08-22 MED ORDER — SODIUM CHLORIDE 0.9% FLUSH
10.0000 mL | Freq: Once | INTRAVENOUS | Status: AC
Start: 2021-08-22 — End: 2021-08-22
  Administered 2021-08-22: 10 mL

## 2021-08-22 MED ORDER — DEXTROSE 5 % IV SOLN
400.0000 mg/m2 | Freq: Once | INTRAVENOUS | Status: AC
  Administered 2021-08-22: 880 mg via INTRAVENOUS
  Filled 2021-08-22: qty 44

## 2021-08-22 MED ORDER — SODIUM CHLORIDE 0.9 % IV SOLN
2000.0000 mg/m2 | INTRAVENOUS | Status: DC
  Administered 2021-08-22: 4400 mg via INTRAVENOUS
  Filled 2021-08-22: qty 88

## 2021-08-22 MED ORDER — DEXTROSE 5 % IV SOLN: Freq: Once | INTRAVENOUS | Status: AC

## 2021-08-22 MED ORDER — OXALIPLATIN CHEMO INJECTION 100 MG/20ML
50.0000 mg/m2 | Freq: Once | INTRAVENOUS | Status: AC
  Administered 2021-08-22: 110 mg via INTRAVENOUS
  Filled 2021-08-22: qty 10

## 2021-08-22 MED ORDER — PALONOSETRON HCL INJECTION 0.25 MG/5ML
0.2500 mg | Freq: Once | INTRAVENOUS | Status: AC
  Administered 2021-08-22: 0.25 mg via INTRAVENOUS
  Filled 2021-08-22: qty 5

## 2021-08-22 NOTE — Progress Notes (Addendum)
[name]   Telephone:([phone]) [phone] Fax:(336) [phone]   Clinic Follow up Note   Patient Care Team: [name], PA as PCP - General (Family Medicine) Lorretta Harp, MD as PCP - Cardiology (Cardiology) [name], MD as Consulting Physician (General Surgery) [name], [name], MD as Consulting Physician (Gastroenterology) [name], MD as Consulting Physician (Hematology) Lorretta Harp, MD as Consulting Physician (Cardiology) 08/22/2021  CHIEF COMPLAINT: f/u of cholangiocarcinoma, watery eyes x 1 week  SUMMARY OF ONCOLOGIC HISTORY: Oncology History Overview Note  Cancer Staging Intrahepatic cholangiocarcinoma ([place]) Staging form: Intrahepatic Bile Duct, AJCC 8th Edition - Clinical stage from 08/19/2019: Stage II (cT2, cN0, cM0) - Signed by [name], MD on 08/19/2019    Intrahepatic cholangiocarcinoma ([place])  06/28/2019 Imaging   CT AP W Contrast 06/28/19  IMPRESSION: 1. Heterogeneous hypodensity posteriorly in the right hepatic lobe and potentially extending into the caudate lobe suspicious for a mass. There is felt to be truncation of branches of the portal vein in this vicinity and some narrowing of the hepatic vein, as well as triangular-shaped regions of abnormal hypoenhancement posteriorly in the right hepatic lobe likely representing downstream vascular effects. Cannot exclude malignancy such as cholangiocarcinoma or hepatocellular carcinoma, and follow up hepatic protocol MRI with and without contrast is recommended to further characterize. 2. 4 mm right middle lobe pulmonary nodule is likely benign but may merit surveillance. 3. Cholelithiasis. 4.  Aortic Atherosclerosis (ICD10-I70.0). 5. Prostatomegaly. 6. Mild impingement at L3-4 and L4-5.   07/31/2019 Imaging   MRI Liver 07/31/19 IMPRESSION: 1. 7.3 cm in long axis mass in the right hepatic lobe spanning into the caudate lobe, high suspicion for malignancy such as hepatocellular carcinoma  or cholangiocarcinoma. Suspected effacement or occlusion of the right hepatic vein and posterior branches of the right portal vein. Two smaller tumor nodules along the posterior periphery of the dominant mass. Tissue diagnosis is recommended. 2. No findings of pathologic adenopathy or distant metastatic spread. 3. 9 mm gallstone in the gallbladder. There is mild gallbladder wall thickening which may be from nondistention, correlate clinically in assessing for cholecystitis. 4.  Aortic Atherosclerosis (ICD10-I70.0). 5. Mild diffuse hepatic steatosis.   08/11/2019 Initial Biopsy   DIAGNOSIS: 08/11/19  A. LIVER, RIGHT, BIOPSY:  - Adenocarcinoma.   08/18/2019 Imaging   CT Chest 08/18/19  IMPRESSION: 1. Multiple pulmonary nodules largest at approximately 7 mm in the right lower lobe, nonspecific but concerning given findings in the liver. 2. No signs of definitive metastatic disease, also with mildly enlarged upper abdominal lymph nodes as discussed.   Aortic Atherosclerosis (ICD10-I70.0).   08/19/2019 Initial Diagnosis   Intrahepatic cholangiocarcinoma ([place])   08/19/2019 Cancer Staging   Staging form: Intrahepatic Bile Duct, AJCC 8th Edition - Clinical stage from 08/19/2019: Stage II (cT2, cN0, cM0) - Signed by [name], MD on 08/19/2019   09/29/2019 - 07/19/2020 Chemotherapy   Cisplatin and Gemcitabine 2 weeks on/1 week off starting 09/29/19. Cisplatin held from cycle 9 (03/23/20) due to fluid status/Afib. He is now on maintenance Gemcitabine. Held after 07/19/20 to proceed with liver target therapy.     10/09/2019 Imaging   CT AP IMPRESSION: 1. The dominant right hepatic lobe mass is minimally reduced in size compared to prior exams, currently measuring 6.2 by 5.3 cm, previously 6.2 by 5.6 cm. However, there is a new small hypodense lesion centrally in the right hepatic lobe which is suspicious for a new small focus of tumor. Accordingly this is an overall mixed appearance.  2. Continued  hypoenhancement in the liver downstream of the tumor likely attributable to narrowing or occlusion of the right hepatic vein by the tumor. By virtue of its location the tumor wraps around the intrahepatic portion of the IVC. 3. 4 mm right middle lobe pulmonary nodule, stable compared to earliest available comparison of 07/18/2019. Surveillance of the patient's pulmonary nodules is recommended. 4. Other imaging findings of potential clinical significance: Coronary atherosclerosis. Cholelithiasis. Prominent stool throughout the colon favors constipation. Moderate prostatomegaly with heterogeneous enhancement of the prostate gland. Lumbar spondylosis and degenerative disc disease causing mild bilateral foraminal impingement at L3-4 and L4-5.   Aortic Atherosclerosis (ICD10-I70.0).   12/24/2019 Imaging   CT CAP W Contrast  IMPRESSION: 1. Right hepatic lobe mass and adjacent right hepatic lobe nodules appear grossly stable. No evidence of distant metastatic disease. 2. Continued stability of small pulmonary nodules. Recommend attention on follow-up. 3. Cholelithiasis. 4. Enlarged prostate. 5. Aortic atherosclerosis (ICD10-I70.0). Coronary artery calcification.   02/23/2020 Procedure   He had PAC placed on 02/23/20.    04/08/2020 Imaging   CT CAP  IMPRESSION: 1. Stable or minimally decreased size of a very ill-defined, hypodense and somewhat retractile appearing mass of the central right lobe of the liver abutting the inferior vena cava and right portal vein, measuring approximately 5.7 x 5.0 cm, previously 6.2 x 5.2 cm when measured similarly. Findings are consistent with stable or minimally improved cholangiocarcinoma. 2. Small hypodense nodules of the right lobe identified on prior examination are poorly appreciated on this single phase contrast examination although not grossly changed. Attention on follow-up. 3. There are new, moderate bilateral pleural effusions and associated atelectasis  or consolidation as well as a new small pericardial effusion, nonspecific although generally concerning and suspicious for malignant effusions. There is no directly visualized pleural nodularity. 4. Multiple small pulmonary nodules are stable.  No new nodules. 5. Coronary artery disease. Aortic Atherosclerosis (ICD10-I70.0).   07/06/2020 Imaging   MRI ABD IMPRESSION: 1. Interval decrease in size of the right hepatic lobe lesion in the medial aspect of the segment 7. Findings would suggest a good response to treatment with some contraction of the tumor. 2. No new hepatic lesions. No abdominal adenopathy or metastatic disease. 3. Stable mild intrahepatic biliary dilatation in the right hepatic lobe distal to the lesion. 4. Somewhat tortuous and almost beaded appearance of the hepatic and portal vein radicles. Findings could be radiation related. 5. Cholelithiasis.  CT chest wo contrast IMPRESSION: 1. Persistent/stable moderate-sized bilateral pleural effusions with overlying atelectasis. 2. Stable small right pulmonary nodules. No new or progressive findings. Recommend continued surveillance. 3. No mediastinal or hilar mass or adenopathy. 4. Stable advanced three-vessel coronary artery calcifications. 5. Cholelithiasis. Aortic Atherosclerosis (ICD10-I70.0)   08/11/2020 Procedure   Y90 on 08/11/20 and 08/26/20 with Dr Laurence Ferrari    11/30/2020 Imaging   MRI Abdomen  IMPRESSION: 1. Today's study demonstrates progression of disease with enlarging central lesion in the right lobe of the liver involving portions of segments 7 and 8, now with evidence of some tumor thrombus extension into the intrahepatic portion of the inferior vena cava. This is associated with increasing intrahepatic biliary ductal dilatation in segment 7 of the liver. 2. In addition, there is some subtle hyperenhancement of the distal common bile duct at and immediately proximally to the level of the ampulla. There is  also some subtle delayed enhancement around this region in the pancreatic head. This is of uncertain etiology and significance, but may be inflammatory and  currently is not associated with proximal common bile duct dilatation. However, close attention on follow-up imaging is recommended. 3. Cholelithiasis without evidence of acute cholecystitis at this time. 4. Aortic atherosclerosis.     12/24/2020 - 03/21/2021 Chemotherapy   Restart Gemcitabine 2 weeks on/1 week off given disease progression beginning 12/24/20. Discontinued after 03/21/21 due to disease progression in liver.     03/16/2021 Imaging   MRI abdomen  IMPRESSION: No significant change in size of irregular hypovascular mass in the medial right hepatic lobe.   New 1.1 cm hypovascular lesion with peripheral rim enhancement in liver dome, suspicious for metastatic disease.   New small right pleural effusion and mild ascites.   Cholelithiasis, without evidence of cholecystitis or biliary dilatation.   04/05/2021 -  Chemotherapy   Second-line FOLFOX q2weeks starting 04/05/21    04/05/2021 -  Chemotherapy   Immunotherapy with Durvalumab (Imfinzi) q4weeks starting 04/05/21. (in addition to second line FOLFOX)   06/23/2021 Imaging   MRI Abdomen IMPRESSION: 1. No significant change in subcapsular mass of the posterior right lobe of the liver. 2. No significant change in an additional small hyperenhancing lesion of the liver dome, which remains suspicious for a satellite lesion. 3. No new liver lesions. 4. 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 5. Cholelithiasis without evidence of acute cholecystitis. 6. Trace ascites.  CT Chest w/o contrast IMPRESSION: 1. Occasional small pulmonary nodules in the right lung are stable, for example a 6 mm nodule right lower lobe and a 3 mm nodule of the lateral segment right middle lobe. These are nonspecific and likely benign, incidental sequelae of infection or inflammation,  metastatic disease not favored. Attention on follow-up. 2. Previously seen bilateral pleural effusions are resolved. 3. Hypodense lesion of the posterior right lobe of the liver, keeping with known cholangiocarcinoma and better characterized by same day MR. 4. Coronary artery disease.     CURRENT THERAPY:  Second-line FOLFOX q2weeks starting 04/05/21 with cycle 11 due today. Last completed on 08/08/21. Additional Durvalumab (Imfinzi) q4weeks starting 04/05/21, last completed on 07/26/21. Last seen by Dr. Burr Medico on 08/08/21.  INTERVAL HISTORY: Brandon Sherman is a 75 yo male presenting to clinic today for follow up of cholangiocarcinoma and does have new complaint of bilateral eye watery drainage x 1 week. He reports blurry vision in his eyes has been going on x approximately 1 month however in the last week he noticed that his eyes have had frequent watery drainage.  He states the drainage is severe and causing problems with his driving as it makes his vision blurry.  He endorses associated photophobia. No over the counter medication tried prior to arrival. No history of the same.  He was unable to schedule an appointment with his primary ophthalmologist at [facility] until November, so he went to a local [facility] for eye exam x2 weeks ago as he did not want to wait that long.  He states at that time the watery discharge was minimal.  He reports having a "normal eye exam with no problems with his eyes from the diabetes standpoint and has known cataracts".  He was given a pair of glasses that he is wearing without improvement.  Denies any associated eye injury.  Does not wear contact lenses.  He denies any fever, cough, fatigue, rash, nausea, vomiting, decreased appetite.  He states his bowel movements have been normal, last one was yesterday.  He denies any blood in his stool.  He is on Eliquis for A.  fib and does admit to easy bruising if he bumps into something especially on forearms  although denies any blood when brushing his teeth or unexplainable bruises.  He reports cold sensitivity lasts approximately 3 days after treatment. He weighs daily and reports weight fluctuates approximately +/- 4 pounds.  Denies any associated weakness or fatigue, lower extremity edema, shortness of breath or chest pain.  He states he saw cardiology recently and is due for follow-up in x1 year.  REVIEW OF SYSTEMS:   All other systems were reviewed with the patient and are negative.  MEDICAL HISTORY:  Past Medical History:  Diagnosis Date   Arthritis    Diabetes ([place])    GERD (gastroesophageal reflux disease)    Hyperlipidemia    Hypertension    Intrahepatic cholangiocarcinoma ([place])     SURGICAL HISTORY: Past Surgical History:  Procedure Laterality Date   APPENDECTOMY  1980   CARDIOVERSION N/A 04/27/2020   Procedure: CARDIOVERSION;  Surgeon: Dorothy Spark, MD;  Location: [facility];  Service: Cardiovascular;  Laterality: N/A;   CARDIOVERSION N/A 05/19/2020   Procedure: CARDIOVERSION;  Surgeon: Elouise Munroe, MD;  Location: [place];  Service: Cardiovascular;  Laterality: N/A;   COLONOSCOPY     IR 3D INDEPENDENT WKST  08/11/2020   IR 3D INDEPENDENT WKST  08/11/2020   IR ANGIOGRAM SELECTIVE EACH ADDITIONAL VESSEL  08/11/2020   IR ANGIOGRAM SELECTIVE EACH ADDITIONAL VESSEL  08/11/2020   IR ANGIOGRAM SELECTIVE EACH ADDITIONAL VESSEL  08/11/2020   IR ANGIOGRAM SELECTIVE EACH ADDITIONAL VESSEL  08/11/2020   IR ANGIOGRAM SELECTIVE EACH ADDITIONAL VESSEL  08/11/2020   IR ANGIOGRAM SELECTIVE EACH ADDITIONAL VESSEL  08/11/2020   IR ANGIOGRAM SELECTIVE EACH ADDITIONAL VESSEL  08/26/2020   IR ANGIOGRAM SELECTIVE EACH ADDITIONAL VESSEL  08/26/2020   IR ANGIOGRAM VISCERAL SELECTIVE  08/11/2020   IR ANGIOGRAM VISCERAL SELECTIVE  08/26/2020   IR EMBO ARTERIAL NOT HEMORR HEMANG INC GUIDE ROADMAPPING  08/11/2020   IR EMBO TUMOR ORGAN ISCHEMIA INFARCT INC GUIDE ROADMAPPING  08/26/2020   IR  IMAGING GUIDED PORT INSERTION  02/23/2020   IR RADIOLOGIST EVAL & MGMT  07/14/2020   IR RADIOLOGIST EVAL & MGMT  09/30/2020   IR RADIOLOGIST EVAL & MGMT  12/01/2020   IR US GUIDE VASC ACCESS LEFT  08/26/2020   IR US GUIDE VASC ACCESS RIGHT  08/11/2020    I have reviewed the social history and family history with the patient and they are unchanged from previous note.  ALLERGIES:  has No Known Allergies.  MEDICATIONS:  Current Outpatient Medications  Medication Sig Dispense Refill   allopurinol (ZYLOPRIM) 100 MG tablet Take 100 mg by mouth daily.     colchicine 0.6 MG tablet Take 1 tablet (0.6 mg total) by mouth daily as needed (gout flare). Take daily for 3 days, then as needed for gout flare 30 tablet 0   diltiazem (CARDIZEM CD) 240 MG 24 hr capsule TAKE 1 CAPSULE BY MOUTH EVERY DAY, STOP AMLODIPINE 90 capsule 3   ELIQUIS 5 MG TABS tablet TAKE 1 TABLET TWICE DAILY 180 tablet 1   fenofibrate (TRICOR) 145 MG tablet Take 145 mg by mouth daily.     finasteride (PROSCAR) 5 MG tablet Take 5 mg by mouth daily.     furosemide (LASIX) 40 MG tablet Take 40 mg twice a day 90 tablet 3   gabapentin (NEURONTIN) 100 MG capsule Take 100 mg by mouth at bedtime.     hydrALAZINE (APRESOLINE) 50 MG tablet TAKE 2  TABLETS BY MOUTH IN THE MORNING, 1 TABLET AT LUNCH AND 2 IN THE EVENING 450 tablet 1   hydrALAZINE (APRESOLINE) 50 MG tablet Take 50 mg by mouth 2 (two) times daily.     KLOR-CON M20 20 MEQ tablet TAKE 1 TABLET BY MOUTH TWICE A DAY 180 tablet 3   lidocaine-prilocaine (EMLA) cream Apply 1 application topically as needed. 30 g 3   metoprolol succinate (TOPROL XL) 25 MG 24 hr tablet Take 1 tablet (25 mg total) by mouth daily. 90 tablet 3   metoprolol tartrate (LOPRESSOR) 50 MG tablet      pantoprazole (PROTONIX) 40 MG tablet TAKE 1 TABLET BY MOUTH TWICE A DAY 180 tablet 2   pravastatin (PRAVACHOL) 40 MG tablet Take 40 mg by mouth daily.     sucralfate (CARAFATE) 1 g tablet Take 1 tablet (1 g total) by  mouth every 6 (six) hours as needed. Please schedule a yearly follow up for further refills: [id number] 60 tablet 0   tamsulosin (FLOMAX) 0.4 MG CAPS capsule Take 0.4 mg by mouth daily.     No current facility-administered medications for this visit.   Facility-Administered Medications Ordered in Other Visits  Medication Dose Route Frequency Provider Last Rate Last Admin   fluorouracil (ADRUCIL) 4,400 mg in sodium chloride 0.9 % 62 mL chemo infusion  2,000 mg/m2 (Treatment Plan Recorded) Intravenous 1 day or 1 dose [name], MD       leucovorin 880 mg in dextrose 5 % 250 mL infusion  400 mg/m2 (Treatment Plan Recorded) Intravenous Once [name], MD       oxaliplatin (ELOXATIN) 110 mg in dextrose 5 % 500 mL chemo infusion  50 mg/m2 (Treatment Plan Recorded) Intravenous Once [name], MD        PHYSICAL EXAMINATION: ECOG PERFORMANCE STATUS: 1 - Symptomatic but completely ambulatory  Vitals:   08/22/21 0954  BP: (!) 142/75  Pulse: 62  Resp: 18  Temp: 97.6 F (36.4 C)  SpO2: 100%   Filed Weights   08/22/21 0954  Weight: 216 lb 1.6 oz (98 kg)    GENERAL:alert, no distress and comfortable SKIN: skin color, texture, turgor are normal, no rashes or significant lesions. General ecchymosis on bilateral forearms EYES: normal, Conjunctiva are pink and non-injected, sclera clear. Minimal clear bilateral watery discharge. Pupils are equal and reactive to light. OROPHARYNX:no exudate, no erythema and lips, buccal mucosa, and tongue normal  LUNGS: clear to auscultation. Normal effort HEART: no murmurs and no lower extremity edema ABDOMEN:abdomen soft, non-tender and normal bowel sounds Musculoskeletal:no cyanosis of digits and no clubbing  NEURO: alert & oriented x 3 with fluent speech, no focal motor/sensory deficits  LABORATORY DATA:  I have reviewed the data as listed CBC Latest Ref Rng & Units 08/22/2021 08/08/2021 07/26/2021  WBC 4.0 - 10.5 K/uL 3.3(L) 7.3 7.2  Hemoglobin 13.0 - 17.0  g/dL 10.8(L) 10.5(L) 11.3(L)  Hematocrit 39.0 - 52.0 % 32.9(L) 32.4(L) 34.9(L)  Platelets 150 - 400 K/uL 93(L) 91(L) 125(L)     CMP Latest Ref Rng & Units 08/22/2021 08/08/2021 07/26/2021  Glucose 70 - 99 mg/dL 151(H) 139(H) 118(H)  BUN 8 - 23 mg/dL 14 15 20   Creatinine 0.61 - 1.24 mg/dL 1.30(H) 1.48(H) 1.53(H)  Sodium 135 - 145 mmol/L 138 138 139  Potassium 3.5 - 5.1 mmol/L 3.6 4.0 4.3  Chloride 98 - 111 mmol/L 108 109 108  CO2 22 - 32 mmol/L 20(L) 21(L) 21(L)  Calcium 8.9 - 10.3 mg/dL 9.2 9.2 9.8  Total Protein 6.5 - 8.1 g/dL 6.8 6.7 7.0  Total Bilirubin 0.3 - 1.2 mg/dL 0.7 0.5 0.5  Alkaline Phos 38 - 126 U/L 78 130(H) 102  AST 15 - 41 U/L 33 35 31  ALT 0 - 44 U/L 21 24 20       RADIOGRAPHIC STUDIES: I have personally reviewed the radiological images as listed and agreed with the findings in the report. No results found.   ASSESSMENT & PLAN:  No problem-specific Assessment & Plan notes found for this encounter.  1-Watery Eyes -Previously complained of blurry vision x 1 month ago, now having watery eyes x 1 week which is affecting his vision and making it difficult to drive.  -Had recent eye exam not by his primary ophthalmologist. Consult placed to his opthalmoglist at [facility] [phone]) to request urgent appointment for evaluation of symptoms.  -This could be side effect of Imfinzi although is not common, so plan to hold immunotherapy today.   2. Intrahepatic cholangiocarcinoma, cT2N0Mx, unresectable, with indeterminate lung nodules  -Diagnosed 07/2019.  -Continue FOLFOX q 2 weeks and immunotherapy x 4 weeks. Currently holding immunotherapy per problem 1, see above. -Will need GCSF injection with pump DC as absolute neutrophil count trending down, still within normal range today at 2.1. Did not receive after last therapy.   3. Rate controlled Afib, CHF -Without acute symptoms. -Stable on outpatient management with cardiologist Dr. Gwenlyn Found including lasix,  diltializem, hydralazine, metoprolol -Had recent cardiology appointment 06/14/21, no medication changes. Still anticoagulated on eliquis.  4. DM, HTN, HLD, gout -Stable on current therapies including metformin, lisinopril, allopurinol -All stable and well controlled on medications prescribed by pcp and cardiology. Can continue to follow there.   Disposition Labs today personally reviewed and interpreted. show improving creatinine 1.3 today which is consistent with baseline. Platelets low at 93, similar to x 2 weeks ago. He can continue Eliquis, will continue to monitor at future visits. Last scans 06/2021, due for imaging in the coming months. CT chest and MR to be scheduled at next appointment. Proceed with FOLFOX cycle 11 today and hold Imfinzi until primary ophthalmology evaluation. There is no clinical evidence of disease progression today.   Discussed plan with Dr. Burr Medico and she is agreeable with plan of care.  No orders of the defined types were placed in this encounter.  All questions were answered. The patient knows to call the clinic with any problems, questions or concerns. No barriers to learning was detected. I spent 30 minutes counseling the patient face to face. The total time spent in the appointment was 30 minutes and more than 50% was on counseling and review of test results     Barrie Folk, PA-C 08/22/21    Addendum:  Patient seen with Milinda Cave. Mr. [place] presents for next cycle FOLFOX (q2 weeks) and durvalumab (q4 weeks). He tolerates treatment well except mild cold sensitivity. He developed blurry vision with worsening lacrimation. This is impairing his driving and ADLs at home. The etiology of this is unclear, possibly immune-related from durvalumab. We have requested an urgent eval by his ophthalmologist. We are holding durvalumab until he is seen. He otherwise is doing well. There is no clinical evidence of disease progression.   Labs reviewed,  adequate to proceed with FOLFOX today as planned.   F/up and next cycle in 2 weeks. The plan was reviewed with Dr. Burr Medico.   Cira Rue, NP 08/22/2021

## 2021-08-22 NOTE — Patient Instructions (Signed)
[facility]   Discharge Instructions: Thank you for choosing [facility] to provide your oncology and hematology care.   If you have a lab appointment with the [facility], please go directly to the [facility] and check in at the registration area.   Wear comfortable clothing and clothing appropriate for easy access to any Portacath or PICC line.   We strive to give you quality time with your provider. You may need to reschedule your appointment if you arrive late (15 or more minutes).  Arriving late affects you and other patients whose appointments are after yours.  Also, if you miss three or more appointments without notifying the office, you may be dismissed from the clinic at the provider's discretion.      For prescription refill requests, have your pharmacy contact our office and allow 72 hours for refills to be completed.    Today you received the following chemotherapy and/or immunotherapy agents: Fluorouracil (Adrucil), Leucovorin, and Oxaliplatin (Eloxatin).      To help prevent nausea and vomiting after your treatment, we encourage you to take your nausea medication as directed.  BELOW ARE SYMPTOMS THAT SHOULD BE REPORTED IMMEDIATELY: *FEVER GREATER THAN 100.4 F (38 C) OR HIGHER *CHILLS OR SWEATING *NAUSEA AND VOMITING THAT IS NOT CONTROLLED WITH YOUR NAUSEA MEDICATION *UNUSUAL SHORTNESS OF BREATH *UNUSUAL BRUISING OR BLEEDING *URINARY PROBLEMS (pain or burning when urinating, or frequent urination) *BOWEL PROBLEMS (unusual diarrhea, constipation, pain near the anus) TENDERNESS IN MOUTH AND THROAT WITH OR WITHOUT PRESENCE OF ULCERS (sore throat, sores in mouth, or a toothache) UNUSUAL RASH, SWELLING OR PAIN  UNUSUAL VAGINAL DISCHARGE OR ITCHING   Items with * indicate a potential emergency and should be followed up as soon as possible or go to the Emergency Department if any problems should occur.  Please show the  CHEMOTHERAPY ALERT CARD or IMMUNOTHERAPY ALERT CARD at check-in to the Emergency Department and triage nurse.  Should you have questions after your visit or need to cancel or reschedule your appointment, please contact [facility]  Dept: [phone]  and follow the prompts.  Office hours are 8:00 a.m. to 4:30 p.m. Monday - Friday. Please note that voicemails left after 4:00 p.m. may not be returned until the following business day.  We are closed weekends and major holidays. You have access to a nurse at all times for urgent questions. Please call the main number to the clinic Dept: [phone] and follow the prompts.   For any non-urgent questions, you may also contact your provider using MyChart. We now offer e-Visits for anyone 56 and older to request care online for non-urgent symptoms. For details visit [email or web address].   Also download the MyChart app! Go to the app store, search "MyChart", open the app, select , and log in with your MyChart username and password.  Due to Covid, a mask is required upon entering the hospital/clinic. If you do not have a mask, one will be given to you upon arrival. For doctor visits, patients may have 1 support person aged 78 or older with them. For treatment visits, patients cannot have anyone with them due to current Covid guidelines and our immunocompromised population.

## 2021-08-22 NOTE — Progress Notes (Signed)
Per Cira Rue, NP okay to treat with PLT 93.

## 2021-08-23 ENCOUNTER — Telehealth: Payer: Self-pay | Admitting: Nurse Practitioner

## 2021-08-23 ENCOUNTER — Telehealth: Payer: Self-pay

## 2021-08-23 LAB — T4: T4, Total: 11 ug/dL (ref 4.5–12.0)

## 2021-08-23 NOTE — Telephone Encounter (Signed)
Most recent office note faxed to [facility] per Cira Rue, NP.

## 2021-08-23 NOTE — Telephone Encounter (Signed)
Left message with follow-up appointments per 10/3 los. 

## 2021-08-24 ENCOUNTER — Other Ambulatory Visit: Payer: Self-pay

## 2021-08-24 ENCOUNTER — Inpatient Hospital Stay: Payer: Medicare HMO

## 2021-08-24 VITALS — BP 140/80 | HR 67 | Temp 98.4°F | Resp 14

## 2021-08-24 DIAGNOSIS — C221 Intrahepatic bile duct carcinoma: Secondary | ICD-10-CM | POA: Diagnosis not present

## 2021-08-24 DIAGNOSIS — Z5111 Encounter for antineoplastic chemotherapy: Secondary | ICD-10-CM | POA: Diagnosis not present

## 2021-08-24 DIAGNOSIS — Z5112 Encounter for antineoplastic immunotherapy: Secondary | ICD-10-CM | POA: Diagnosis not present

## 2021-08-24 DIAGNOSIS — Z95828 Presence of other vascular implants and grafts: Secondary | ICD-10-CM

## 2021-08-24 DIAGNOSIS — Z7189 Other specified counseling: Secondary | ICD-10-CM

## 2021-08-24 DIAGNOSIS — Z79899 Other long term (current) drug therapy: Secondary | ICD-10-CM | POA: Diagnosis not present

## 2021-08-24 DIAGNOSIS — Z5189 Encounter for other specified aftercare: Secondary | ICD-10-CM | POA: Diagnosis not present

## 2021-08-24 MED ORDER — PEGFILGRASTIM-JMDB 6 MG/0.6ML ~~LOC~~ SOSY
6.0000 mg | PREFILLED_SYRINGE | Freq: Once | SUBCUTANEOUS | Status: AC
  Administered 2021-08-24: 6 mg via SUBCUTANEOUS
  Filled 2021-08-24: qty 0.6

## 2021-08-24 MED ORDER — HEPARIN SOD (PORK) LOCK FLUSH 100 UNIT/ML IV SOLN
500.0000 [IU] | Freq: Once | INTRAVENOUS | Status: AC
  Administered 2021-08-24: 500 [IU]

## 2021-08-24 MED ORDER — SODIUM CHLORIDE 0.9% FLUSH
10.0000 mL | Freq: Once | INTRAVENOUS | Status: AC
  Administered 2021-08-24: 10 mL

## 2021-09-01 ENCOUNTER — Telehealth: Payer: Self-pay | Admitting: *Deleted

## 2021-09-01 DIAGNOSIS — H2513 Age-related nuclear cataract, bilateral: Secondary | ICD-10-CM | POA: Diagnosis not present

## 2021-09-01 DIAGNOSIS — H25042 Posterior subcapsular polar age-related cataract, left eye: Secondary | ICD-10-CM | POA: Diagnosis not present

## 2021-09-01 DIAGNOSIS — H40003 Preglaucoma, unspecified, bilateral: Secondary | ICD-10-CM | POA: Diagnosis not present

## 2021-09-01 NOTE — Telephone Encounter (Signed)
Per Cira Rue, NP, called pt for contact information for ophthalmologist and receive exam report. Pt stated he only knows he has to have cataract surgery, does not know if vision disturbance was related to receiving immunotherapy. Called and received eye exam report and given to provider.

## 2021-09-05 ENCOUNTER — Encounter: Payer: Self-pay | Admitting: Hematology

## 2021-09-05 ENCOUNTER — Inpatient Hospital Stay: Payer: Medicare HMO

## 2021-09-05 ENCOUNTER — Other Ambulatory Visit: Payer: Self-pay

## 2021-09-05 ENCOUNTER — Inpatient Hospital Stay: Payer: Medicare HMO | Admitting: Hematology

## 2021-09-05 VITALS — BP 141/83 | HR 64 | Temp 98.4°F | Resp 19 | Ht 69.0 in | Wt 212.5 lb

## 2021-09-05 DIAGNOSIS — C221 Intrahepatic bile duct carcinoma: Secondary | ICD-10-CM

## 2021-09-05 DIAGNOSIS — Z5111 Encounter for antineoplastic chemotherapy: Secondary | ICD-10-CM | POA: Diagnosis not present

## 2021-09-05 DIAGNOSIS — Z7189 Other specified counseling: Secondary | ICD-10-CM

## 2021-09-05 DIAGNOSIS — Z95828 Presence of other vascular implants and grafts: Secondary | ICD-10-CM

## 2021-09-05 DIAGNOSIS — Z79899 Other long term (current) drug therapy: Secondary | ICD-10-CM | POA: Diagnosis not present

## 2021-09-05 DIAGNOSIS — Z5189 Encounter for other specified aftercare: Secondary | ICD-10-CM | POA: Diagnosis not present

## 2021-09-05 DIAGNOSIS — Z5112 Encounter for antineoplastic immunotherapy: Secondary | ICD-10-CM | POA: Diagnosis not present

## 2021-09-05 LAB — CBC WITH DIFFERENTIAL (CANCER CENTER ONLY)
Abs Immature Granulocytes: 0.04 10*3/uL (ref 0.00–0.07)
Basophils Absolute: 0 10*3/uL (ref 0.0–0.1)
Basophils Relative: 0 %
Eosinophils Absolute: 0 10*3/uL (ref 0.0–0.5)
Eosinophils Relative: 1 %
HCT: 33.8 % — ABNORMAL LOW (ref 39.0–52.0)
Hemoglobin: 10.8 g/dL — ABNORMAL LOW (ref 13.0–17.0)
Immature Granulocytes: 1 %
Lymphocytes Relative: 11 %
Lymphs Abs: 0.7 10*3/uL (ref 0.7–4.0)
MCH: 33.5 pg (ref 26.0–34.0)
MCHC: 32 g/dL (ref 30.0–36.0)
MCV: 105 fL — ABNORMAL HIGH (ref 80.0–100.0)
Monocytes Absolute: 0.6 10*3/uL (ref 0.1–1.0)
Monocytes Relative: 9 %
Neutro Abs: 4.6 10*3/uL (ref 1.7–7.7)
Neutrophils Relative %: 78 %
Platelet Count: 89 10*3/uL — ABNORMAL LOW (ref 150–400)
RBC: 3.22 MIL/uL — ABNORMAL LOW (ref 4.22–5.81)
RDW: 17.7 % — ABNORMAL HIGH (ref 11.5–15.5)
WBC Count: 6 10*3/uL (ref 4.0–10.5)
nRBC: 0 % (ref 0.0–0.2)

## 2021-09-05 LAB — CMP (CANCER CENTER ONLY)
ALT: 22 U/L (ref 0–44)
AST: 31 U/L (ref 15–41)
Albumin: 3.4 g/dL — ABNORMAL LOW (ref 3.5–5.0)
Alkaline Phosphatase: 101 U/L (ref 38–126)
Anion gap: 9 (ref 5–15)
BUN: 13 mg/dL (ref 8–23)
CO2: 20 mmol/L — ABNORMAL LOW (ref 22–32)
Calcium: 9.4 mg/dL (ref 8.9–10.3)
Chloride: 110 mmol/L (ref 98–111)
Creatinine: 1.52 mg/dL — ABNORMAL HIGH (ref 0.61–1.24)
GFR, Estimated: 48 mL/min — ABNORMAL LOW (ref >60)
Glucose, Bld: 129 mg/dL — ABNORMAL HIGH (ref 70–99)
Potassium: 4 mmol/L (ref 3.5–5.1)
Sodium: 139 mmol/L (ref 135–145)
Total Bilirubin: 0.5 mg/dL (ref 0.3–1.2)
Total Protein: 6.9 g/dL (ref 6.5–8.1)

## 2021-09-05 MED ORDER — PALONOSETRON HCL INJECTION 0.25 MG/5ML
0.2500 mg | Freq: Once | INTRAVENOUS | Status: AC
Start: 2021-09-05 — End: 2021-09-05
  Administered 2021-09-05: 0.25 mg via INTRAVENOUS
  Filled 2021-09-05: qty 5

## 2021-09-05 MED ORDER — SODIUM CHLORIDE 0.9% FLUSH
10.0000 mL | Freq: Once | INTRAVENOUS | Status: AC
  Administered 2021-09-05: 10 mL

## 2021-09-05 MED ORDER — SODIUM CHLORIDE 0.9 % IV SOLN
2000.0000 mg/m2 | INTRAVENOUS | Status: DC
  Administered 2021-09-05: 4400 mg via INTRAVENOUS
  Filled 2021-09-05: qty 88

## 2021-09-05 MED ORDER — DEXTROSE 5 % IV SOLN: Freq: Once | INTRAVENOUS | Status: AC

## 2021-09-05 MED ORDER — HEPARIN SOD (PORK) LOCK FLUSH 100 UNIT/ML IV SOLN: 500.0000 [IU] | Freq: Once | INTRAVENOUS | Status: DC | PRN

## 2021-09-05 MED ORDER — SODIUM CHLORIDE 0.9 % IV SOLN
1500.0000 mg | Freq: Once | INTRAVENOUS | Status: AC
  Administered 2021-09-05: 1500 mg via INTRAVENOUS
  Filled 2021-09-05: qty 30

## 2021-09-05 MED ORDER — LEUCOVORIN CALCIUM INJECTION 350 MG
400.0000 mg/m2 | Freq: Once | INTRAVENOUS | Status: AC
  Administered 2021-09-05: 880 mg via INTRAVENOUS
  Filled 2021-09-05: qty 44

## 2021-09-05 MED ORDER — SODIUM CHLORIDE 0.9% FLUSH: 10.0000 mL | INTRAVENOUS | Status: DC | PRN

## 2021-09-05 MED ORDER — OXALIPLATIN CHEMO INJECTION 100 MG/20ML
50.0000 mg/m2 | Freq: Once | INTRAVENOUS | Status: AC
  Administered 2021-09-05: 110 mg via INTRAVENOUS
  Filled 2021-09-05: qty 22

## 2021-09-05 MED ORDER — SODIUM CHLORIDE 0.9 % IV SOLN: Freq: Once | INTRAVENOUS | Status: AC

## 2021-09-05 NOTE — Patient Instructions (Addendum)
[facility]  Discharge Instructions: Thank you for choosing [facility] to provide your oncology and hematology care.   If you have a lab appointment with the [facility], please go directly to the [facility] and check in at the registration area.   Wear comfortable clothing and clothing appropriate for easy access to any Portacath or PICC line.   We strive to give you quality time with your provider. You may need to reschedule your appointment if you arrive late (15 or more minutes).  Arriving late affects you and other patients whose appointments are after yours.  Also, if you miss three or more appointments without notifying the office, you may be dismissed from the clinic at the provider's discretion.      For prescription refill requests, have your pharmacy contact our office and allow 72 hours for refills to be completed.    Today you received the following chemotherapy and/or immunotherapy agents: Durvalumab (Imfinzi), Oxaliplatin, Leucovorin, and Fluorouracil.   To help prevent nausea and vomiting after your treatment, we encourage you to take your nausea medication as directed.  BELOW ARE SYMPTOMS THAT SHOULD BE REPORTED IMMEDIATELY: *FEVER GREATER THAN 100.4 F (38 C) OR HIGHER *CHILLS OR SWEATING *NAUSEA AND VOMITING THAT IS NOT CONTROLLED WITH YOUR NAUSEA MEDICATION *UNUSUAL SHORTNESS OF BREATH *UNUSUAL BRUISING OR BLEEDING *URINARY PROBLEMS (pain or burning when urinating, or frequent urination) *BOWEL PROBLEMS (unusual diarrhea, constipation, pain near the anus) TENDERNESS IN MOUTH AND THROAT WITH OR WITHOUT PRESENCE OF ULCERS (sore throat, sores in mouth, or a toothache) UNUSUAL RASH, SWELLING OR PAIN  UNUSUAL VAGINAL DISCHARGE OR ITCHING   Items with * indicate a potential emergency and should be followed up as soon as possible or go to the Emergency Department if any problems should occur.  Please show the CHEMOTHERAPY  ALERT CARD or IMMUNOTHERAPY ALERT CARD at check-in to the Emergency Department and triage nurse.  Should you have questions after your visit or need to cancel or reschedule your appointment, please contact [facility]  Dept: [phone]  and follow the prompts.  Office hours are 8:00 a.m. to 4:30 p.m. Monday - Friday. Please note that voicemails left after 4:00 p.m. may not be returned until the following business day.  We are closed weekends and major holidays. You have access to a nurse at all times for urgent questions. Please call the main number to the clinic Dept: [phone] and follow the prompts.   For any non-urgent questions, you may also contact your provider using MyChart. We now offer e-Visits for anyone 36 and older to request care online for non-urgent symptoms. For details visit [email or web address].   Also download the MyChart app! Go to the app store, search "MyChart", open the app, select [facility], and log in with your MyChart username and password.  Due to Covid, a mask is required upon entering the hospital/clinic. If you do not have a mask, one will be given to you upon arrival. For doctor visits, patients may have 1 support person aged 50 or older with them. For treatment visits, patients cannot have anyone with them due to current Covid guidelines and our immunocompromised population.   The chemotherapy medication bag should finish at 46 hours, 96 hours, or 7 days. For example, if your pump is scheduled for 46 hours and it was put on at 4:00 p.m., it should finish at 2:00 p.m. the day it is scheduled to come off regardless  of your appointment time.     Estimated time to finish at: 1:45 pm   If the display on your pump reads "Low Volume" and it is beeping, take the batteries out of the pump and come to the cancer center for it to be taken off.   If the pump alarms go off prior to the pump reading "Low Volume" then call [phone] and  someone can assist you.  If the plunger comes out and the chemotherapy medication is leaking out, please use your home chemo spill kit to clean up the spill. Do NOT use paper towels or other household products.  If you have problems or questions regarding your pump, please call either [phone] (24 hours a day) or the cancer center Monday-Friday 8:00 a.m.- 4:30 p.m. at the clinic number and we will assist you. If you are unable to get assistance, then go to the nearest Emergency Department and ask the staff to contact the IV team for assistance.

## 2021-09-05 NOTE — Progress Notes (Signed)
[place]   Telephone:(336) [phone] Fax:(336) [phone]   Clinic Follow up Note   Patient Care Team: [name], PA as PCP - General (Family Medicine) Lorretta Harp, MD as PCP - Cardiology (Cardiology) [name], MD as Consulting Physician (General Surgery) [name], [name], MD as Consulting Physician (Gastroenterology) [name], MD as Consulting Physician (Hematology) Lorretta Harp, MD as Consulting Physician (Cardiology)  Date of Service:  09/05/2021  CHIEF COMPLAINT: f/u of cholangiocarcinoma  CURRENT THERAPY:  Second-line FOLFOX q2weeks and Durvalumab (Imfinzi) q4weeks starting 04/05/21  ASSESSMENT & PLAN:  Brandon Sherman is a 75 y.o. male with   1. Intrahepatic cholangiocarcinoma, cT2N0M0, stage II, unresectable, with indeterminate lung nodules  -He was diagnosed in 07/2019. CT scans and MRI liver show a large 7.3cm mass in the right hepatic lobe which abuts portal vein. -He started IV Cisplatin and Gemcitabine 2 weeks on/1 week off on 09/29/19. Cisplatin was held since C9 (03/23/20) due to fluid status/AFib. He continued maintenance Gemcitabine until 07/19/20. He proceeded with Y90 on 08/11/20 and 08/26/20. -His MRI Abdomen from 12/11/20 showed progression of disease in liver with tumor thrombus extension into IVC. Given disease progression, Dr Geroge Baseman does not recommend more Y90 or liver target therapy at this point.  -To control his disease I restarted Single agent Gemcitabine 2 weeks on/1 week off beginning 12/24/20.  -He has IDH-1 mutation, which makes him eligible for an oral IDH inhibitor drug Ivosidenib.  -Based on 03/16/21 MRI he progressed with New 1.1 cm hypovascular lesion suspicious for metastatic disease. -We switched to second-line chemo with FOLFOX q2weeks and added immunotherapy Durvalumab (Imfinzi) q3-4weeks starting 04/05/21.  -Restaging CT chest and MRI abd on 06/23/21 showed stable disease in liver, small and stable lung nodules, no  new lesions. -labs reviewed, overall adequate to proceed with C12 -Continue FOLFOX q2weeks and Imfinzi q4weeks. Due to his cytopenias, I have reduced his chemo dose from cycle 8.   2. Symptom management: Watery Eyes -Previously complained of blurry vision, now having watery eyes, which is affecting his vision and making it difficult to drive.  -he is aware he has a cataract, which is likely the cause of his blurry vision. He is scheduled to meet with a surgeon next week to discuss removal. -given no change in his watery eyes, we will resume Imfinzi today.   3. HTN, HLD, Gout, LE edema -On Metformin, amlodipine, lisinopril., allopurinol, Lasix. Continue to f/u with his PCP and cardiologist. He is now on Lasix three days a week. -His PCP has started him on Janumet for his feet burning.    4. AF and CHF -With PAC placement on 02/23/20 he was found to have Afib. Based on CHADS2 Score 2. He is at moderate risk for stroke.  -He underwent Cardioversion with Dr. Meda Coffee on 04/27/20. After CHF in late 04/2020 he had another cardioversion on 05/19/20. He has recovered well.  -His Afib is intermittent. He will continue his medications and f/u with cardiologist. He is on Eliquis    5. Nicotine Use -He never smoked but has been chewing Tobacco for the past 60 years. He no longer drinks alcohol.  -He currently chews 1-2 times a day. I have repeatedly discussed and encouraged tobacco cessation.    6. GERD and gastritis, history of esophageal candidiasis, DM  -He had repeated EGD with Dr. Havery Moros in 07/2019. 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 -Will  monitor and may repeat EGD in future  -He is not currently on DM medication. I encouraged him to watch his diet and reduce sugar.      PLAN:  -Proceed with C12 FOLFOX, keeping same reduced oxaliplatin and 5-fu dose, and restart Imfinzi today -Lab, flush, f/u, and  FOLFOX every 2 weeks x3, with Imfinzi in 4 weeks -restaging MRI to be done in 3-4 weeks   No problem-specific Assessment & Plan notes found for this encounter.   SUMMARY OF ONCOLOGIC HISTORY: Oncology History Overview Note  Cancer Staging Intrahepatic cholangiocarcinoma ([place]) Staging form: Intrahepatic Bile Duct, AJCC 8th Edition - Clinical stage from 08/19/2019: Stage II (cT2, cN0, cM0) - Signed by [name], MD on 08/19/2019    Intrahepatic cholangiocarcinoma ([place])  06/28/2019 Imaging   CT AP W Contrast 06/28/19  IMPRESSION: 1. Heterogeneous hypodensity posteriorly in the right hepatic lobe and potentially extending into the caudate lobe suspicious for a mass. There is felt to be truncation of branches of the portal vein in this vicinity and some narrowing of the hepatic vein, as well as triangular-shaped regions of abnormal hypoenhancement posteriorly in the right hepatic lobe likely representing downstream vascular effects. Cannot exclude malignancy such as cholangiocarcinoma or hepatocellular carcinoma, and follow up hepatic protocol MRI with and without contrast is recommended to further characterize. 2. 4 mm right middle lobe pulmonary nodule is likely benign but may merit surveillance. 3. Cholelithiasis. 4.  Aortic Atherosclerosis (ICD10-I70.0). 5. Prostatomegaly. 6. Mild impingement at L3-4 and L4-5.   07/31/2019 Imaging   MRI Liver 07/31/19 IMPRESSION: 1. 7.3 cm in long axis mass in the right hepatic lobe spanning into the caudate lobe, high suspicion for malignancy such as hepatocellular carcinoma or cholangiocarcinoma. Suspected effacement or occlusion of the right hepatic vein and posterior branches of the right portal vein. Two smaller tumor nodules along the posterior periphery of the dominant mass. Tissue diagnosis is recommended. 2. No findings of pathologic adenopathy or distant metastatic spread. 3. 9 mm gallstone in the gallbladder. There is mild gallbladder  wall thickening which may be from nondistention, correlate clinically in assessing for cholecystitis. 4.  Aortic Atherosclerosis (ICD10-I70.0). 5. Mild diffuse hepatic steatosis.   08/11/2019 Initial Biopsy   DIAGNOSIS: 08/11/19  A. LIVER, RIGHT, BIOPSY:  - Adenocarcinoma.   08/18/2019 Imaging   CT Chest 08/18/19  IMPRESSION: 1. Multiple pulmonary nodules largest at approximately 7 mm in the right lower lobe, nonspecific but concerning given findings in the liver. 2. No signs of definitive metastatic disease, also with mildly enlarged upper abdominal lymph nodes as discussed.   Aortic Atherosclerosis (ICD10-I70.0).   08/19/2019 Initial Diagnosis   Intrahepatic cholangiocarcinoma ([place])   08/19/2019 Cancer Staging   Staging form: Intrahepatic Bile Duct, AJCC 8th Edition - Clinical stage from 08/19/2019: Stage II (cT2, cN0, cM0) - Signed by [name], MD on 08/19/2019   09/29/2019 - 07/19/2020 Chemotherapy   Cisplatin and Gemcitabine 2 weeks on/1 week off starting 09/29/19. Cisplatin held from cycle 9 (03/23/20) due to fluid status/Afib. He is now on maintenance Gemcitabine. Held after 07/19/20 to proceed with liver target therapy.     10/09/2019 Imaging   CT AP IMPRESSION: 1. The dominant right hepatic lobe mass is minimally reduced in size compared to prior exams, currently measuring 6.2 by 5.3 cm, previously 6.2 by 5.6 cm. However, there is a new small hypodense lesion centrally in the right hepatic lobe which is suspicious for a new small focus of tumor. Accordingly this is an overall mixed appearance.  2. Continued hypoenhancement in the liver downstream of the tumor likely attributable to narrowing or occlusion of the right hepatic vein by the tumor. By virtue of its location the tumor wraps around the intrahepatic portion of the IVC. 3. 4 mm right middle lobe pulmonary nodule, stable compared to earliest available comparison of 07/18/2019. Surveillance of the patient's pulmonary nodules is  recommended. 4. Other imaging findings of potential clinical significance: Coronary atherosclerosis. Cholelithiasis. Prominent stool throughout the colon favors constipation. Moderate prostatomegaly with heterogeneous enhancement of the prostate gland. Lumbar spondylosis and degenerative disc disease causing mild bilateral foraminal impingement at L3-4 and L4-5.   Aortic Atherosclerosis (ICD10-I70.0).   12/24/2019 Imaging   CT CAP W Contrast  IMPRESSION: 1. Right hepatic lobe mass and adjacent right hepatic lobe nodules appear grossly stable. No evidence of distant metastatic disease. 2. Continued stability of small pulmonary nodules. Recommend attention on follow-up. 3. Cholelithiasis. 4. Enlarged prostate. 5. Aortic atherosclerosis (ICD10-I70.0). Coronary artery calcification.   02/23/2020 Procedure   He had PAC placed on 02/23/20.    04/08/2020 Imaging   CT CAP  IMPRESSION: 1. Stable or minimally decreased size of a very ill-defined, hypodense and somewhat retractile appearing mass of the central right lobe of the liver abutting the inferior vena cava and right portal vein, measuring approximately 5.7 x 5.0 cm, previously 6.2 x 5.2 cm when measured similarly. Findings are consistent with stable or minimally improved cholangiocarcinoma. 2. Small hypodense nodules of the right lobe identified on prior examination are poorly appreciated on this single phase contrast examination although not grossly changed. Attention on follow-up. 3. There are new, moderate bilateral pleural effusions and associated atelectasis or consolidation as well as a new small pericardial effusion, nonspecific although generally concerning and suspicious for malignant effusions. There is no directly visualized pleural nodularity. 4. Multiple small pulmonary nodules are stable.  No new nodules. 5. Coronary artery disease. Aortic Atherosclerosis (ICD10-I70.0).   07/06/2020 Imaging   MRI ABD IMPRESSION: 1.  Interval decrease in size of the right hepatic lobe lesion in the medial aspect of the segment 7. Findings would suggest a good response to treatment with some contraction of the tumor. 2. No new hepatic lesions. No abdominal adenopathy or metastatic disease. 3. Stable mild intrahepatic biliary dilatation in the right hepatic lobe distal to the lesion. 4. Somewhat tortuous and almost beaded appearance of the hepatic and portal vein radicles. Findings could be radiation related. 5. Cholelithiasis.  CT chest wo contrast IMPRESSION: 1. Persistent/stable moderate-sized bilateral pleural effusions with overlying atelectasis. 2. Stable small right pulmonary nodules. No new or progressive findings. Recommend continued surveillance. 3. No mediastinal or hilar mass or adenopathy. 4. Stable advanced three-vessel coronary artery calcifications. 5. Cholelithiasis. Aortic Atherosclerosis (ICD10-I70.0)   08/11/2020 Procedure   Y90 on 08/11/20 and 08/26/20 with Dr Laurence Ferrari    11/30/2020 Imaging   MRI Abdomen  IMPRESSION: 1. Today's study demonstrates progression of disease with enlarging central lesion in the right lobe of the liver involving portions of segments 7 and 8, now with evidence of some tumor thrombus extension into the intrahepatic portion of the inferior vena cava. This is associated with increasing intrahepatic biliary ductal dilatation in segment 7 of the liver. 2. In addition, there is some subtle hyperenhancement of the distal common bile duct at and immediately proximally to the level of the ampulla. There is also some subtle delayed enhancement around this region in the pancreatic head. This is of uncertain etiology and significance, but may be inflammatory and currently  is not associated with proximal common bile duct dilatation. However, close attention on follow-up imaging is recommended. 3. Cholelithiasis without evidence of acute cholecystitis at this time. 4. Aortic  atherosclerosis.     12/24/2020 - 03/21/2021 Chemotherapy   Restart Gemcitabine 2 weeks on/1 week off given disease progression beginning 12/24/20. Discontinued after 03/21/21 due to disease progression in liver.     03/16/2021 Imaging   MRI abdomen  IMPRESSION: No significant change in size of irregular hypovascular mass in the medial right hepatic lobe.   New 1.1 cm hypovascular lesion with peripheral rim enhancement in liver dome, suspicious for metastatic disease.   New small right pleural effusion and mild ascites.   Cholelithiasis, without evidence of cholecystitis or biliary dilatation.   04/05/2021 -  Chemotherapy   Second-line FOLFOX q2weeks starting 04/05/21    04/05/2021 -  Chemotherapy   Immunotherapy with Durvalumab (Imfinzi) q4weeks starting 04/05/21. (in addition to second line FOLFOX)   06/23/2021 Imaging   MRI Abdomen IMPRESSION: 1. No significant change in subcapsular mass of the posterior right lobe of the liver. 2. No significant change in an additional small hyperenhancing lesion of the liver dome, which remains suspicious for a satellite lesion. 3. No new liver lesions. 4. 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 5. Cholelithiasis without evidence of acute cholecystitis. 6. Trace ascites.  CT Chest w/o contrast IMPRESSION: 1. Occasional small pulmonary nodules in the right lung are stable, for example a 6 mm nodule right lower lobe and a 3 mm nodule of the lateral segment right middle lobe. These are nonspecific and likely benign, incidental sequelae of infection or inflammation, metastatic disease not favored. Attention on follow-up. 2. Previously seen bilateral pleural effusions are resolved. 3. Hypodense lesion of the posterior right lobe of the liver, keeping with known cholangiocarcinoma and better characterized by same day MR. 4. Coronary artery disease.      INTERVAL HISTORY:  Brandon Sherman is here for a follow up of  cholangiocarcinoma. He was last seen by me on 08/08/21 and by NP Lacie in the interim. He presents to the clinic accompanied by his wife. He reports he is still having recent issues with watery eyes. He notes he went to his eye doctor last week, who noted no issues aside from the known cataract. He notes he has not noticed a difference since being off immunotherapy the last two weeks. He reports continued numbness in his fingertips, which is stable.   All other systems were reviewed with the patient and are negative.  MEDICAL HISTORY:  Past Medical History:  Diagnosis Date   Arthritis    Diabetes ([place])    GERD (gastroesophageal reflux disease)    Hyperlipidemia    Hypertension    Intrahepatic cholangiocarcinoma ([place])     SURGICAL HISTORY: Past Surgical History:  Procedure Laterality Date   APPENDECTOMY  1980   CARDIOVERSION N/A 04/27/2020   Procedure: CARDIOVERSION;  Surgeon: Dorothy Spark, MD;  Location: [facility];  Service: Cardiovascular;  Laterality: N/A;   CARDIOVERSION N/A 05/19/2020   Procedure: CARDIOVERSION;  Surgeon: Elouise Munroe, MD;  Location: [facility];  Service: Cardiovascular;  Laterality: N/A;   COLONOSCOPY     IR 3D INDEPENDENT WKST  08/11/2020   IR 3D INDEPENDENT WKST  08/11/2020   IR ANGIOGRAM SELECTIVE EACH ADDITIONAL VESSEL  08/11/2020   IR ANGIOGRAM SELECTIVE EACH ADDITIONAL VESSEL  08/11/2020   IR ANGIOGRAM SELECTIVE EACH ADDITIONAL VESSEL  08/11/2020   IR ANGIOGRAM SELECTIVE EACH ADDITIONAL VESSEL  08/11/2020   IR ANGIOGRAM SELECTIVE EACH ADDITIONAL VESSEL  08/11/2020   IR ANGIOGRAM SELECTIVE EACH ADDITIONAL VESSEL  08/11/2020   IR ANGIOGRAM SELECTIVE EACH ADDITIONAL VESSEL  08/26/2020   IR ANGIOGRAM SELECTIVE EACH ADDITIONAL VESSEL  08/26/2020   IR ANGIOGRAM VISCERAL SELECTIVE  08/11/2020   IR ANGIOGRAM VISCERAL SELECTIVE  08/26/2020   IR EMBO ARTERIAL NOT HEMORR HEMANG INC GUIDE ROADMAPPING  08/11/2020   IR EMBO TUMOR ORGAN ISCHEMIA INFARCT INC GUIDE  ROADMAPPING  08/26/2020   IR IMAGING GUIDED PORT INSERTION  02/23/2020   IR RADIOLOGIST EVAL & MGMT  07/14/2020   IR RADIOLOGIST EVAL & MGMT  09/30/2020   IR RADIOLOGIST EVAL & MGMT  12/01/2020   IR US GUIDE VASC ACCESS LEFT  08/26/2020   IR US GUIDE VASC ACCESS RIGHT  08/11/2020    I have reviewed the social history and family history with the patient and they are unchanged from previous note.  ALLERGIES:  has No Known Allergies.  MEDICATIONS:  Current Outpatient Medications  Medication Sig Dispense Refill   allopurinol (ZYLOPRIM) 100 MG tablet Take 100 mg by mouth daily.     colchicine 0.6 MG tablet Take 1 tablet (0.6 mg total) by mouth daily as needed (gout flare). Take daily for 3 days, then as needed for gout flare 30 tablet 0   diltiazem (CARDIZEM CD) 240 MG 24 hr capsule TAKE 1 CAPSULE BY MOUTH EVERY DAY, STOP AMLODIPINE 90 capsule 3   ELIQUIS 5 MG TABS tablet TAKE 1 TABLET TWICE DAILY 180 tablet 1   fenofibrate (TRICOR) 145 MG tablet Take 145 mg by mouth daily.     finasteride (PROSCAR) 5 MG tablet Take 5 mg by mouth daily.     furosemide (LASIX) 40 MG tablet Take 40 mg twice a day 90 tablet 3   gabapentin (NEURONTIN) 100 MG capsule Take 100 mg by mouth at bedtime.     hydrALAZINE (APRESOLINE) 50 MG tablet TAKE 2 TABLETS BY MOUTH IN THE MORNING, 1 TABLET AT LUNCH AND 2 IN THE EVENING 450 tablet 1   hydrALAZINE (APRESOLINE) 50 MG tablet Take 50 mg by mouth 2 (two) times daily.     KLOR-CON M20 20 MEQ tablet TAKE 1 TABLET BY MOUTH TWICE A DAY 180 tablet 3   lidocaine-prilocaine (EMLA) cream Apply 1 application topically as needed. 30 g 3   metoprolol succinate (TOPROL XL) 25 MG 24 hr tablet Take 1 tablet (25 mg total) by mouth daily. 90 tablet 3   metoprolol tartrate (LOPRESSOR) 50 MG tablet      pantoprazole (PROTONIX) 40 MG tablet TAKE 1 TABLET BY MOUTH TWICE A DAY 180 tablet 2   pravastatin (PRAVACHOL) 40 MG tablet Take 40 mg by mouth daily.     sucralfate (CARAFATE) 1 g tablet Take  1 tablet (1 g total) by mouth every 6 (six) hours as needed. Please schedule a yearly follow up for further refills: [phone] 60 tablet 0   tamsulosin (FLOMAX) 0.4 MG CAPS capsule Take 0.4 mg by mouth daily.     No current facility-administered medications for this visit.    PHYSICAL EXAMINATION: ECOG PERFORMANCE STATUS: 1 - Symptomatic but completely ambulatory  Vitals:   09/05/21 1007  BP: (!) 141/83  Pulse: 64  Resp: 19  Temp: 98.4 F (36.9 C)  SpO2: 100%   Wt Readings from Last 3 Encounters:  09/05/21 212 lb 8 oz (96.4 kg)  08/22/21 216 lb 1.6 oz (98 kg)  08/08/21 218 lb 4.8 oz (  99 kg)     GENERAL:alert, no distress and comfortable SKIN: skin color normal, no rashes or significant lesions EYES: normal, Conjunctiva are pink and non-injected, sclera clear  NEURO: alert & oriented x 3 with fluent speech  LABORATORY DATA:  I have reviewed the data as listed CBC Latest Ref Rng & Units 09/05/2021 08/22/2021 08/08/2021  WBC 4.0 - 10.5 K/uL 6.0 3.3(L) 7.3  Hemoglobin 13.0 - 17.0 g/dL 10.8(L) 10.8(L) 10.5(L)  Hematocrit 39.0 - 52.0 % 33.8(L) 32.9(L) 32.4(L)  Platelets 150 - 400 K/uL 89(L) 93(L) 91(L)     CMP Latest Ref Rng & Units 09/05/2021 08/22/2021 08/08/2021  Glucose 70 - 99 mg/dL 129(H) 151(H) 139(H)  BUN 8 - 23 mg/dL 13 14 15   Creatinine 0.61 - 1.24 mg/dL 1.52(H) 1.30(H) 1.48(H)  Sodium 135 - 145 mmol/L 139 138 138  Potassium 3.5 - 5.1 mmol/L 4.0 3.6 4.0  Chloride 98 - 111 mmol/L 110 108 109  CO2 22 - 32 mmol/L 20(L) 20(L) 21(L)  Calcium 8.9 - 10.3 mg/dL 9.4 9.2 9.2  Total Protein 6.5 - 8.1 g/dL 6.9 6.8 6.7  Total Bilirubin 0.3 - 1.2 mg/dL 0.5 0.7 0.5  Alkaline Phos 38 - 126 U/L 101 78 130(H)  AST 15 - 41 U/L 31 33 35  ALT 0 - 44 U/L 22 21 24       RADIOGRAPHIC STUDIES: I have personally reviewed the radiological images as listed and agreed with the findings in the report. No results found.    Orders Placed This Encounter  Procedures   MR Abdomen W Wo  Contrast    Liver protocol to evaluate his cholangiocarcinoma    Standing Status:   Future    Standing Expiration Date:   09/05/2022    Order Specific Question:   If indicated for the ordered procedure, I authorize the administration of contrast media per Radiology protocol    Answer:   Yes    Order Specific Question:   What is the patient's sedation requirement?    Answer:   No Sedation    Order Specific Question:   Does the patient have a pacemaker or implanted devices?    Answer:   No    Order Specific Question:   Preferred imaging location?    Answer:   [facility] (table limit - 550 lbs)   All questions were answered. The patient knows to call the clinic with any problems, questions or concerns. No barriers to learning was detected. The total time spent in the appointment was 30 minutes.     [name], MD 09/05/2021   I, Wilburn Mylar, am acting as scribe for [name], MD.   I have reviewed the above documentation for accuracy and completeness, and I agree with the above.

## 2021-09-05 NOTE — Progress Notes (Signed)
Per Dr. Burr Medico, ok for treatment today with platelets 89,000 and serum creatine 1.52

## 2021-09-06 ENCOUNTER — Telehealth: Payer: Self-pay | Admitting: Hematology

## 2021-09-06 NOTE — Telephone Encounter (Signed)
Scheduled follow-up appointments per 10/17 los. Patient's wife is aware. 

## 2021-09-07 ENCOUNTER — Inpatient Hospital Stay: Payer: Medicare HMO

## 2021-09-07 ENCOUNTER — Other Ambulatory Visit: Payer: Self-pay

## 2021-09-07 VITALS — BP 125/75 | HR 63 | Temp 98.8°F | Resp 17

## 2021-09-07 DIAGNOSIS — Z5111 Encounter for antineoplastic chemotherapy: Secondary | ICD-10-CM | POA: Diagnosis not present

## 2021-09-07 DIAGNOSIS — Z7189 Other specified counseling: Secondary | ICD-10-CM

## 2021-09-07 DIAGNOSIS — Z79899 Other long term (current) drug therapy: Secondary | ICD-10-CM | POA: Diagnosis not present

## 2021-09-07 DIAGNOSIS — Z95828 Presence of other vascular implants and grafts: Secondary | ICD-10-CM

## 2021-09-07 DIAGNOSIS — C221 Intrahepatic bile duct carcinoma: Secondary | ICD-10-CM | POA: Diagnosis not present

## 2021-09-07 DIAGNOSIS — Z5112 Encounter for antineoplastic immunotherapy: Secondary | ICD-10-CM | POA: Diagnosis not present

## 2021-09-07 DIAGNOSIS — Z5189 Encounter for other specified aftercare: Secondary | ICD-10-CM | POA: Diagnosis not present

## 2021-09-07 MED ORDER — SODIUM CHLORIDE 0.9% FLUSH
10.0000 mL | Freq: Once | INTRAVENOUS | Status: AC
  Administered 2021-09-07: 10 mL

## 2021-09-07 MED ORDER — PEGFILGRASTIM-JMDB 6 MG/0.6ML ~~LOC~~ SOSY
6.0000 mg | PREFILLED_SYRINGE | Freq: Once | SUBCUTANEOUS | Status: AC
  Administered 2021-09-07: 6 mg via SUBCUTANEOUS
  Filled 2021-09-07: qty 0.6

## 2021-09-07 MED ORDER — HEPARIN SOD (PORK) LOCK FLUSH 100 UNIT/ML IV SOLN
500.0000 [IU] | Freq: Once | INTRAVENOUS | Status: AC
  Administered 2021-09-07: 500 [IU]

## 2021-09-11 ENCOUNTER — Other Ambulatory Visit: Payer: Self-pay | Admitting: Cardiovascular Disease

## 2021-09-14 ENCOUNTER — Other Ambulatory Visit: Payer: Self-pay | Admitting: Cardiovascular Disease

## 2021-09-14 DIAGNOSIS — R0600 Dyspnea, unspecified: Secondary | ICD-10-CM

## 2021-09-15 DIAGNOSIS — H40013 Open angle with borderline findings, low risk, bilateral: Secondary | ICD-10-CM | POA: Diagnosis not present

## 2021-09-15 DIAGNOSIS — H25811 Combined forms of age-related cataract, right eye: Secondary | ICD-10-CM | POA: Diagnosis not present

## 2021-09-15 DIAGNOSIS — H25812 Combined forms of age-related cataract, left eye: Secondary | ICD-10-CM | POA: Diagnosis not present

## 2021-09-15 DIAGNOSIS — Z01818 Encounter for other preprocedural examination: Secondary | ICD-10-CM | POA: Diagnosis not present

## 2021-09-18 NOTE — Progress Notes (Signed)
[place]   [phone] [phone]   Clinic Follow up Note   Patient Care Team: [name], PA as PCP - General (Family Medicine) Lorretta Harp, MD as PCP - Cardiology (Cardiology) [name], MD as Consulting Physician (General Surgery) [name], [name], MD as Consulting Physician (Gastroenterology) [name], MD as Consulting Physician (Hematology) Lorretta Harp, MD as Consulting Physician (Cardiology) 09/19/2021  CHIEF COMPLAINT: Follow up cholangiocarcinoma   SUMMARY OF ONCOLOGIC HISTORY: Oncology History Overview Note  Cancer Staging Intrahepatic cholangiocarcinoma [facility]) Staging form: Intrahepatic Bile Duct, AJCC 8th Edition - Clinical stage from 08/19/2019: Stage II (cT2, cN0, cM0) - Signed by [name], MD on 08/19/2019    Intrahepatic cholangiocarcinoma ([place])  06/28/2019 Imaging   CT AP W Contrast 06/28/19  IMPRESSION: 1. Heterogeneous hypodensity posteriorly in the right hepatic lobe and potentially extending into the caudate lobe suspicious for a mass. There is felt to be truncation of branches of the portal vein in this vicinity and some narrowing of the hepatic vein, as well as triangular-shaped regions of abnormal hypoenhancement posteriorly in the right hepatic lobe likely representing downstream vascular effects. Cannot exclude malignancy such as cholangiocarcinoma or hepatocellular carcinoma, and follow up hepatic protocol MRI with and without contrast is recommended to further characterize. 2. 4 mm right middle lobe pulmonary nodule is likely benign but may merit surveillance. 3. Cholelithiasis. 4.  Aortic Atherosclerosis (ICD10-I70.0). 5. Prostatomegaly. 6. Mild impingement at L3-4 and L4-5.   07/31/2019 Imaging   MRI Liver 07/31/19 IMPRESSION: 1. 7.3 cm in long axis mass in the right hepatic lobe spanning into the caudate lobe, high suspicion for malignancy such as hepatocellular carcinoma or  cholangiocarcinoma. Suspected effacement or occlusion of the right hepatic vein and posterior branches of the right portal vein. Two smaller tumor nodules along the posterior periphery of the dominant mass. Tissue diagnosis is recommended. 2. No findings of pathologic adenopathy or distant metastatic spread. 3. 9 mm gallstone in the gallbladder. There is mild gallbladder wall thickening which may be from nondistention, correlate clinically in assessing for cholecystitis. 4.  Aortic Atherosclerosis (ICD10-I70.0). 5. Mild diffuse hepatic steatosis.   08/11/2019 Initial Biopsy   DIAGNOSIS: 08/11/19  A. LIVER, RIGHT, BIOPSY:  - Adenocarcinoma.   08/18/2019 Imaging   CT Chest 08/18/19  IMPRESSION: 1. Multiple pulmonary nodules largest at approximately 7 mm in the right lower lobe, nonspecific but concerning given findings in the liver. 2. No signs of definitive metastatic disease, also with mildly enlarged upper abdominal lymph nodes as discussed.   Aortic Atherosclerosis (ICD10-I70.0).   08/19/2019 Initial Diagnosis   Intrahepatic cholangiocarcinoma ([place])   08/19/2019 Cancer Staging   Staging form: Intrahepatic Bile Duct, AJCC 8th Edition - Clinical stage from 08/19/2019: Stage II (cT2, cN0, cM0) - Signed by [name], MD on 08/19/2019    09/29/2019 - 07/19/2020 Chemotherapy   Cisplatin and Gemcitabine 2 weeks on/1 week off starting 09/29/19. Cisplatin held from cycle 9 (03/23/20) due to fluid status/Afib. He is now on maintenance Gemcitabine. Held after 07/19/20 to proceed with liver target therapy.     10/09/2019 Imaging   CT AP IMPRESSION: 1. The dominant right hepatic lobe mass is minimally reduced in size compared to prior exams, currently measuring 6.2 by 5.3 cm, previously 6.2 by 5.6 cm. However, there is a new small hypodense lesion centrally in the right hepatic lobe which is suspicious for a new small focus of tumor. Accordingly this is an overall mixed appearance. 2. Continued  hypoenhancement in the liver downstream of the tumor likely attributable to narrowing or occlusion of the right hepatic vein by the tumor. By virtue of its location the tumor wraps around the intrahepatic portion of the IVC. 3. 4 mm right middle lobe pulmonary nodule, stable compared to earliest available comparison of 07/18/2019. Surveillance of the patient's pulmonary nodules is recommended. 4. Other imaging findings of potential clinical significance: Coronary atherosclerosis. Cholelithiasis. Prominent stool throughout the colon favors constipation. Moderate prostatomegaly with heterogeneous enhancement of the prostate gland. Lumbar spondylosis and degenerative disc disease causing mild bilateral foraminal impingement at L3-4 and L4-5.   Aortic Atherosclerosis (ICD10-I70.0).   12/24/2019 Imaging   CT CAP W Contrast  IMPRESSION: 1. Right hepatic lobe mass and adjacent right hepatic lobe nodules appear grossly stable. No evidence of distant metastatic disease. 2. Continued stability of small pulmonary nodules. Recommend attention on follow-up. 3. Cholelithiasis. 4. Enlarged prostate. 5. Aortic atherosclerosis (ICD10-I70.0). Coronary artery calcification.   02/23/2020 Procedure   He had PAC placed on 02/23/20.    04/08/2020 Imaging   CT CAP  IMPRESSION: 1. Stable or minimally decreased size of a very ill-defined, hypodense and somewhat retractile appearing mass of the central right lobe of the liver abutting the inferior vena cava and right portal vein, measuring approximately 5.7 x 5.0 cm, previously 6.2 x 5.2 cm when measured similarly. Findings are consistent with stable or minimally improved cholangiocarcinoma. 2. Small hypodense nodules of the right lobe identified on prior examination are poorly appreciated on this single phase contrast examination although not grossly changed. Attention on follow-up. 3. There are new, moderate bilateral pleural effusions and associated atelectasis  or consolidation as well as a new small pericardial effusion, nonspecific although generally concerning and suspicious for malignant effusions. There is no directly visualized pleural nodularity. 4. Multiple small pulmonary nodules are stable.  No new nodules. 5. Coronary artery disease. Aortic Atherosclerosis (ICD10-I70.0).   07/06/2020 Imaging   MRI ABD IMPRESSION: 1. Interval decrease in size of the right hepatic lobe lesion in the medial aspect of the segment 7. Findings would suggest a good response to treatment with some contraction of the tumor. 2. No new hepatic lesions. No abdominal adenopathy or metastatic disease. 3. Stable mild intrahepatic biliary dilatation in the right hepatic lobe distal to the lesion. 4. Somewhat tortuous and almost beaded appearance of the hepatic and portal vein radicles. Findings could be radiation related. 5. Cholelithiasis.  CT chest wo contrast IMPRESSION: 1. Persistent/stable moderate-sized bilateral pleural effusions with overlying atelectasis. 2. Stable small right pulmonary nodules. No new or progressive findings. Recommend continued surveillance. 3. No mediastinal or hilar mass or adenopathy. 4. Stable advanced three-vessel coronary artery calcifications. 5. Cholelithiasis. Aortic Atherosclerosis (ICD10-I70.0)   08/11/2020 Procedure   Y90 on 08/11/20 and 08/26/20 with Dr Laurence Ferrari    11/30/2020 Imaging   MRI Abdomen  IMPRESSION: 1. Today's study demonstrates progression of disease with enlarging central lesion in the right lobe of the liver involving portions of segments 7 and 8, now with evidence of some tumor thrombus extension into the intrahepatic portion of the inferior vena cava. This is associated with increasing intrahepatic biliary ductal dilatation in segment 7 of the liver. 2. In addition, there is some subtle hyperenhancement of the distal common bile duct at and immediately proximally to the level of the ampulla. There is  also some subtle delayed enhancement around this region in the pancreatic head. This is of uncertain etiology and significance, but may be inflammatory and currently is not  associated with proximal common bile duct dilatation. However, close attention on follow-up imaging is recommended. 3. Cholelithiasis without evidence of acute cholecystitis at this time. 4. Aortic atherosclerosis.     12/24/2020 - 03/21/2021 Chemotherapy   Restart Gemcitabine 2 weeks on/1 week off given disease progression beginning 12/24/20. Discontinued after 03/21/21 due to disease progression in liver.     03/16/2021 Imaging   MRI abdomen  IMPRESSION: No significant change in size of irregular hypovascular mass in the medial right hepatic lobe.   New 1.1 cm hypovascular lesion with peripheral rim enhancement in liver dome, suspicious for metastatic disease.   New small right pleural effusion and mild ascites.   Cholelithiasis, without evidence of cholecystitis or biliary dilatation.   04/05/2021 -  Chemotherapy   Second-line FOLFOX q2weeks starting 04/05/21    04/05/2021 -  Chemotherapy   Immunotherapy with Durvalumab (Imfinzi) q4weeks starting 04/05/21. (in addition to second line FOLFOX)   06/23/2021 Imaging   MRI Abdomen IMPRESSION: 1. No significant change in subcapsular mass of the posterior right lobe of the liver. 2. No significant change in an additional small hyperenhancing lesion of the liver dome, which remains suspicious for a satellite lesion. 3. No new liver lesions. 4. 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 5. Cholelithiasis without evidence of acute cholecystitis. 6. Trace ascites.  CT Chest w/o contrast IMPRESSION: 1. Occasional small pulmonary nodules in the right lung are stable, for example a 6 mm nodule right lower lobe and a 3 mm nodule of the lateral segment right middle lobe. These are nonspecific and likely benign, incidental sequelae of infection or inflammation,  metastatic disease not favored. Attention on follow-up. 2. Previously seen bilateral pleural effusions are resolved. 3. Hypodense lesion of the posterior right lobe of the liver, keeping with known cholangiocarcinoma and better characterized by same day MR. 4. Coronary artery disease.     CURRENT THERAPY: Second-line FOLFOX q2weeks and Durvalumab (Imfinzi) q4weeks starting 04/05/21  INTERVAL HISTORY: Mr. Marczak returns for follow up and treatment as scheduled. Last seen by Dr. Burr Medico 09/05/21; he completed another cycle of FOLFOX and resumed imfinzi due to no change in his watery eyes while off immunotherapy.  He is planning to have cataract surgery in 1 and 2 weeks, left eye first.  He continues to tolerate treatment without any new or worsening side effects.  Energy and appetite are normal, activity level remains good most of the time, occasional weakness resolves with rest.  Taste is "off" without mucositis.  Cold sensitivity last 3 days, he has residual tingling in the absence of cold exposure which is stable on treatment.  Denies fever, chills, cough, chest pain, dyspnea, nausea/vomiting, constipation, diarrhea, abdominal pain or bloating, or any other new concerns.   MEDICAL HISTORY:  Past Medical History:  Diagnosis Date   Arthritis    Diabetes ([place])    GERD (gastroesophageal reflux disease)    Hyperlipidemia    Hypertension    Intrahepatic cholangiocarcinoma ([place])     SURGICAL HISTORY: Past Surgical History:  Procedure Laterality Date   APPENDECTOMY  1980   CARDIOVERSION N/A 04/27/2020   Procedure: CARDIOVERSION;  Surgeon: Dorothy Spark, MD;  Location[facility] ENDOSCOPY;  Service: Cardiovascular;  Laterality: N/A;   CARDIOVERSION N/A 05/19/2020   Procedure: CARDIOVERSION;  Surgeon: Elouise Munroe, MD;  Location: [facility];  Service: Cardiovascular;  Laterality: N/A;   COLONOSCOPY     IR 3D INDEPENDENT WKST  08/11/2020   IR 3D INDEPENDENT WKST  08/11/2020   IR  ANGIOGRAM  SELECTIVE EACH ADDITIONAL VESSEL  08/11/2020   IR ANGIOGRAM SELECTIVE EACH ADDITIONAL VESSEL  08/11/2020   IR ANGIOGRAM SELECTIVE EACH ADDITIONAL VESSEL  08/11/2020   IR ANGIOGRAM SELECTIVE EACH ADDITIONAL VESSEL  08/11/2020   IR ANGIOGRAM SELECTIVE EACH ADDITIONAL VESSEL  08/11/2020   IR ANGIOGRAM SELECTIVE EACH ADDITIONAL VESSEL  08/11/2020   IR ANGIOGRAM SELECTIVE EACH ADDITIONAL VESSEL  08/26/2020   IR ANGIOGRAM SELECTIVE EACH ADDITIONAL VESSEL  08/26/2020   IR ANGIOGRAM VISCERAL SELECTIVE  08/11/2020   IR ANGIOGRAM VISCERAL SELECTIVE  08/26/2020   IR EMBO ARTERIAL NOT HEMORR HEMANG INC GUIDE ROADMAPPING  08/11/2020   IR EMBO TUMOR ORGAN ISCHEMIA INFARCT INC GUIDE ROADMAPPING  08/26/2020   IR IMAGING GUIDED PORT INSERTION  02/23/2020   IR RADIOLOGIST EVAL & MGMT  07/14/2020   IR RADIOLOGIST EVAL & MGMT  09/30/2020   IR RADIOLOGIST EVAL & MGMT  12/01/2020   IR US GUIDE VASC ACCESS LEFT  08/26/2020   IR US GUIDE VASC ACCESS RIGHT  08/11/2020    I have reviewed the social history and family history with the patient and they are unchanged from previous note.  ALLERGIES:  has No Known Allergies.  MEDICATIONS:  Current Outpatient Medications  Medication Sig Dispense Refill   allopurinol (ZYLOPRIM) 100 MG tablet Take 100 mg by mouth daily.     colchicine 0.6 MG tablet Take 1 tablet (0.6 mg total) by mouth daily as needed (gout flare). Take daily for 3 days, then as needed for gout flare 30 tablet 0   diltiazem (CARDIZEM CD) 240 MG 24 hr capsule TAKE 1 CAPSULE BY MOUTH EVERY DAY, STOP AMLODIPINE 90 capsule 3   ELIQUIS 5 MG TABS tablet TAKE 1 TABLET TWICE DAILY 180 tablet 1   fenofibrate (TRICOR) 145 MG tablet Take 145 mg by mouth daily.     finasteride (PROSCAR) 5 MG tablet Take 5 mg by mouth daily.     furosemide (LASIX) 40 MG tablet TAKE 2 TABLETS (80 MG TOTAL) BY MOUTH 2 (TWO) TIMES DAILY. 360 tablet 3   gabapentin (NEURONTIN) 100 MG capsule Take 100 mg by mouth at bedtime.     hydrALAZINE  (APRESOLINE) 50 MG tablet Take 50 mg by mouth 2 (two) times daily.     hydrALAZINE (APRESOLINE) 50 MG tablet TAKE 2 TABLETS BY MOUTH IN THE MORNING, 1 TABLET AT LUNCH AND 2 IN THE EVENING 450 tablet 1   KLOR-CON M20 20 MEQ tablet TAKE 1 TABLET BY MOUTH TWICE A DAY 180 tablet 3   lidocaine-prilocaine (EMLA) cream Apply 1 application topically as needed. 30 g 3   metoprolol succinate (TOPROL XL) 25 MG 24 hr tablet Take 1 tablet (25 mg total) by mouth daily. 90 tablet 3   metoprolol tartrate (LOPRESSOR) 50 MG tablet      pantoprazole (PROTONIX) 40 MG tablet TAKE 1 TABLET BY MOUTH TWICE A DAY 180 tablet 2   pravastatin (PRAVACHOL) 40 MG tablet Take 40 mg by mouth daily.     sucralfate (CARAFATE) 1 g tablet Take 1 tablet (1 g total) by mouth every 6 (six) hours as needed. Please schedule a yearly follow up for further refills: [phone] 60 tablet 0   tamsulosin (FLOMAX) 0.4 MG CAPS capsule Take 0.4 mg by mouth daily.     No current facility-administered medications for this visit.    PHYSICAL EXAMINATION: ECOG PERFORMANCE STATUS: 1 - Symptomatic but completely ambulatory  Vitals:   09/19/21 1032  BP: 126/65  Pulse: 62  Resp: 18  Temp: 97.9 F (36.6 C)  SpO2: 100%   Filed Weights   09/19/21 1032  Weight: 212 lb 3.2 oz (96.3 kg)    GENERAL:alert, no distress and comfortable SKIN: No rash EYES: sclera clear LUNGS: clear with normal breathing effort HEART: regular rate & rhythm, no lower extremity edema ABDOMEN:abdomen soft, non-tender and normal bowel sounds NEURO: alert & oriented x 3 with fluent speech, no focal motor deficits.  Decreased peripheral vibratory sense over the fingertips per tuning fork exam. PAC without erythema  LABORATORY DATA:  I have reviewed the data as listed CBC Latest Ref Rng & Units 09/19/2021 09/05/2021 08/22/2021  WBC 4.0 - 10.5 K/uL 7.9 6.0 3.3(L)  Hemoglobin 13.0 - 17.0 g/dL 10.5(L) 10.8(L) 10.8(L)  Hematocrit 39.0 - 52.0 % 32.7(L) 33.8(L) 32.9(L)   Platelets 150 - 400 K/uL 104(L) 89(L) 93(L)     CMP Latest Ref Rng & Units 09/19/2021 09/05/2021 08/22/2021  Glucose 70 - 99 mg/dL 137(H) 129(H) 151(H)  BUN 8 - 23 mg/dL 16 13 14   Creatinine 0.61 - 1.24 mg/dL 1.47(H) 1.52(H) 1.30(H)  Sodium 135 - 145 mmol/L 138 139 138  Potassium 3.5 - 5.1 mmol/L 4.3 4.0 3.6  Chloride 98 - 111 mmol/L 108 110 108  CO2 22 - 32 mmol/L 23 20(L) 20(L)  Calcium 8.9 - 10.3 mg/dL 9.4 9.4 9.2  Total Protein 6.5 - 8.1 g/dL 7.1 6.9 6.8  Total Bilirubin 0.3 - 1.2 mg/dL 0.5 0.5 0.7  Alkaline Phos 38 - 126 U/L 122 101 78  AST 15 - 41 U/L 40 31 33  ALT 0 - 44 U/L 25 22 21       RADIOGRAPHIC STUDIES: I have personally reviewed the radiological images as listed and agreed with the findings in the report. No results found.   ASSESSMENT & PLAN: Brandon Sherman is a 75 y.o. male with     1. Intrahepatic cholangiocarcinoma, cT2N0Mx, unresectable, with indeterminate lung nodules  -Diagnosed in 07/2019. CT scans and MRI liver show a large 7.3cm mass in the right hepatic lobe which abuts portal vein. Both our local surgeon Dr. Barry Dienes and Dr Carlis Abbott at [facility] concluded that cancer is not resectable, and therefore not likely curable, due to the invasion to portal vein. -He began palliative systemic treatment standard first line chemo with IV Cisplatin and Gemcitabine 2 weeks on/1 week off. Started 09/29/19, tolerating well cisplatin was held with cycle 9 on 03/23/2020 due to fluid status/A. fib, now on maintenance gemcitabine alone -His FO results showed MSI stable disease, IDH1 mutations he may benefit from IDH inhibitor in the future -he went to single agent gemcitabine q2 weeks on 12/24/20 until disease progression in liver on 03/16/21 MRI with concern for new liver lesion  -Started second line FOLFOX q2 weeks plus durvalumab q4 weeks starting 04/05/21 -Restaging chest CT and MRI abdomen 06/23/2021 showed stable disease in the liver, small and stable lung nodules, no new  lesions -Dose reduced chemo from cycle 8 due to cytopenias  2.  Lacrimation -He previously had blurry vision, began having watery eyes in 08/2021 affecting his vision and driving -Seen by ophthalmology, pending cataract surgery in November -Lacrimation did not improve off Imfinzi, immunotherapy was resumed   3. Nausea, Low food intake and weight loss   -With pain from GERD and nausea he initially lost 30 pounds.  -followed by dietician -weight fluctuation has some component of fluid retention in lower extremities -stable overall    4. Afib, CHF -diagnosed after PAC  placement on 02/23/20. -CHADS2 score 2, moderate risk for stroke. Started on metoprolol and eliquis on 03/01/20. Tolerating well without bleeding.  -He was started on lasix 20 mg every other day after consult with Dr. Gwenlyn Found; his dyspnea and leg edema have improved -He had cardioversion on 05/19/2020 -continue f/u with cardiology, Dr. Gwenlyn Found    5. DM, HTN, HLD, Gout -On Metformin, amlodipine, lisinopril, lasix, allopurinol -Continue to f/u with his PCP  -now on metoprolol and Eliquis for Afib -I previously reduced lasix to 1 tab BID rather than 2 tabs BID -monitoring BP   6. Nicotine Use -He never smoked but has been chewing Tobacco for the past 60 years. He no longer drinks alcohol and tells me he has quit smoking (11/25/19).    7. GERD and gastritis, history of esophageal candidiasis -He had repeated EGD with Dr. Havery Moros in 07/2019. 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 May repeat EGD in future  -GERD resolved on PPI and carafate  Disposition: Mr. Gonyer appears stable.  He continues FOLFOX every 2 weeks and durvalumab every 4 weeks.  He tolerates treatment well with mild fatigue, cold sensitivity and neuropathy.  Side effects are stable and well managed with supportive care at home.  He is able to recover and function well.   There is no clinical evidence of disease progression.  Labs reviewed, adequate to proceed with another cycle of FOLFOX and G-CSF today as planned.  If neuropathy worsens we will reduce or hold oxaliplatin in the future.  We will postpone next cycle for upcoming cataract surgeries.  He will return for lab, follow-up, FOLFOX and durvalumab in 3 weeks with restaging few days prior.  He agrees to receive treatment the week of Thanksgiving.  All questions were answered. The patient knows to call the clinic with any problems, questions or concerns. No barriers to learning were detected.     Alla Feeling, NP 09/19/21

## 2021-09-19 ENCOUNTER — Other Ambulatory Visit: Payer: Self-pay

## 2021-09-19 ENCOUNTER — Inpatient Hospital Stay: Payer: Medicare HMO

## 2021-09-19 ENCOUNTER — Inpatient Hospital Stay (HOSPITAL_BASED_OUTPATIENT_CLINIC_OR_DEPARTMENT_OTHER): Payer: Medicare HMO | Admitting: Nurse Practitioner

## 2021-09-19 ENCOUNTER — Encounter: Payer: Self-pay | Admitting: Nurse Practitioner

## 2021-09-19 VITALS — BP 126/65 | HR 62 | Temp 97.9°F | Resp 18 | Ht 69.0 in | Wt 212.2 lb

## 2021-09-19 DIAGNOSIS — Z7189 Other specified counseling: Secondary | ICD-10-CM

## 2021-09-19 DIAGNOSIS — C221 Intrahepatic bile duct carcinoma: Secondary | ICD-10-CM

## 2021-09-19 DIAGNOSIS — Z79899 Other long term (current) drug therapy: Secondary | ICD-10-CM | POA: Diagnosis not present

## 2021-09-19 DIAGNOSIS — Z5189 Encounter for other specified aftercare: Secondary | ICD-10-CM | POA: Diagnosis not present

## 2021-09-19 DIAGNOSIS — Z95828 Presence of other vascular implants and grafts: Secondary | ICD-10-CM

## 2021-09-19 DIAGNOSIS — Z5112 Encounter for antineoplastic immunotherapy: Secondary | ICD-10-CM | POA: Diagnosis not present

## 2021-09-19 DIAGNOSIS — Z5111 Encounter for antineoplastic chemotherapy: Secondary | ICD-10-CM | POA: Diagnosis not present

## 2021-09-19 LAB — CBC WITH DIFFERENTIAL (CANCER CENTER ONLY)
Abs Immature Granulocytes: 0.1 10*3/uL — ABNORMAL HIGH (ref 0.00–0.07)
Basophils Absolute: 0 10*3/uL (ref 0.0–0.1)
Basophils Relative: 1 %
Eosinophils Absolute: 0 10*3/uL (ref 0.0–0.5)
Eosinophils Relative: 1 %
HCT: 32.7 % — ABNORMAL LOW (ref 39.0–52.0)
Hemoglobin: 10.5 g/dL — ABNORMAL LOW (ref 13.0–17.0)
Immature Granulocytes: 1 %
Lymphocytes Relative: 9 %
Lymphs Abs: 0.7 10*3/uL (ref 0.7–4.0)
MCH: 34 pg (ref 26.0–34.0)
MCHC: 32.1 g/dL (ref 30.0–36.0)
MCV: 105.8 fL — ABNORMAL HIGH (ref 80.0–100.0)
Monocytes Absolute: 0.8 10*3/uL (ref 0.1–1.0)
Monocytes Relative: 10 %
Neutro Abs: 6.2 10*3/uL (ref 1.7–7.7)
Neutrophils Relative %: 78 %
Platelet Count: 104 10*3/uL — ABNORMAL LOW (ref 150–400)
RBC: 3.09 MIL/uL — ABNORMAL LOW (ref 4.22–5.81)
RDW: 17.6 % — ABNORMAL HIGH (ref 11.5–15.5)
WBC Count: 7.9 10*3/uL (ref 4.0–10.5)
nRBC: 0 % (ref 0.0–0.2)

## 2021-09-19 LAB — TSH: TSH: 2.833 u[IU]/mL (ref 0.320–4.118)

## 2021-09-19 LAB — CMP (CANCER CENTER ONLY)
ALT: 25 U/L (ref 0–44)
AST: 40 U/L (ref 15–41)
Albumin: 3.5 g/dL (ref 3.5–5.0)
Alkaline Phosphatase: 122 U/L (ref 38–126)
Anion gap: 7 (ref 5–15)
BUN: 16 mg/dL (ref 8–23)
CO2: 23 mmol/L (ref 22–32)
Calcium: 9.4 mg/dL (ref 8.9–10.3)
Chloride: 108 mmol/L (ref 98–111)
Creatinine: 1.47 mg/dL — ABNORMAL HIGH (ref 0.61–1.24)
GFR, Estimated: 50 mL/min — ABNORMAL LOW (ref >60)
Glucose, Bld: 137 mg/dL — ABNORMAL HIGH (ref 70–99)
Potassium: 4.3 mmol/L (ref 3.5–5.1)
Sodium: 138 mmol/L (ref 135–145)
Total Bilirubin: 0.5 mg/dL (ref 0.3–1.2)
Total Protein: 7.1 g/dL (ref 6.5–8.1)

## 2021-09-19 MED ORDER — SODIUM CHLORIDE 0.9% FLUSH
10.0000 mL | Freq: Once | INTRAVENOUS | Status: AC
  Administered 2021-09-19: 10 mL

## 2021-09-19 MED ORDER — HEPARIN SOD (PORK) LOCK FLUSH 100 UNIT/ML IV SOLN: 500.0000 [IU] | Freq: Once | INTRAVENOUS | Status: DC | PRN

## 2021-09-19 MED ORDER — LEUCOVORIN CALCIUM INJECTION 350 MG
400.0000 mg/m2 | Freq: Once | INTRAVENOUS | Status: AC
  Administered 2021-09-19: 880 mg via INTRAVENOUS
  Filled 2021-09-19: qty 44

## 2021-09-19 MED ORDER — SODIUM CHLORIDE 0.9% FLUSH: 10.0000 mL | INTRAVENOUS | Status: DC | PRN

## 2021-09-19 MED ORDER — SODIUM CHLORIDE 0.9 % IV SOLN
2000.0000 mg/m2 | INTRAVENOUS | Status: DC
  Administered 2021-09-19: 4400 mg via INTRAVENOUS
  Filled 2021-09-19: qty 88

## 2021-09-19 MED ORDER — PALONOSETRON HCL INJECTION 0.25 MG/5ML
0.2500 mg | Freq: Once | INTRAVENOUS | Status: AC
  Administered 2021-09-19: 0.25 mg via INTRAVENOUS
  Filled 2021-09-19: qty 5

## 2021-09-19 MED ORDER — DEXTROSE 5 % IV SOLN: Freq: Once | INTRAVENOUS | Status: AC

## 2021-09-19 MED ORDER — OXALIPLATIN CHEMO INJECTION 100 MG/20ML
50.0000 mg/m2 | Freq: Once | INTRAVENOUS | Status: AC
  Administered 2021-09-19: 110 mg via INTRAVENOUS
  Filled 2021-09-19: qty 20

## 2021-09-19 NOTE — Patient Instructions (Signed)
[facility] ONCOL[facility]  Discharge Instructions: Thank you for choosing [facility] to provide your oncology and hematology care.   If you have a lab appointment with the [facility], please go directly to the [facility] and check in at the registration area.   Wear comfortable clothing and clothing appropriate for easy access to any Portacath or PICC line.   We strive to give you quality time with your provider. You may need to reschedule your appointment if you arrive late (15 or more minutes).  Arriving late affects you and other patients whose appointments are after yours.  Also, if you miss three or more appointments without notifying the office, you may be dismissed from the clinic at the provider's discretion.      For prescription refill requests, have your pharmacy contact our office and allow 72 hours for refills to be completed.    Today you received the following chemotherapy and/or immunotherapy agents :  Leucovorin, Oxaliplatin, & Fluorourocil.       To help prevent nausea and vomiting after your treatment, we encourage you to take your nausea medication as directed.  BELOW ARE SYMPTOMS THAT SHOULD BE REPORTED IMMEDIATELY: *FEVER GREATER THAN 100.4 F (38 C) OR HIGHER *CHILLS OR SWEATING *NAUSEA AND VOMITING THAT IS NOT CONTROLLED WITH YOUR NAUSEA MEDICATION *UNUSUAL SHORTNESS OF BREATH *UNUSUAL BRUISING OR BLEEDING *URINARY PROBLEMS (pain or burning when urinating, or frequent urination) *BOWEL PROBLEMS (unusual diarrhea, constipation, pain near the anus) TENDERNESS IN MOUTH AND THROAT WITH OR WITHOUT PRESENCE OF ULCERS (sore throat, sores in mouth, or a toothache) UNUSUAL RASH, SWELLING OR PAIN  UNUSUAL VAGINAL DISCHARGE OR ITCHING   Items with * indicate a potential emergency and should be followed up as soon as possible or go to the Emergency Department if any problems should occur.  Please show the CHEMOTHERAPY ALERT CARD or  IMMUNOTHERAPY ALERT CARD at check-in to the Emergency Department and triage nurse.  Should you have questions after your visit or need to cancel or reschedule your appointment, please contact [facility]  Dept: [phone]  and follow the prompts.  Office hours are 8:00 a.m. to 4:30 p.m. Monday - Friday. Please note that voicemails left after 4:00 p.m. may not be returned until the following business day.  We are closed weekends and major holidays. You have access to a nurse at all times for urgent questions. Please call the main number to the clinic Dept: [phone] and follow the prompts.   For any non-urgent questions, you may also contact your provider using MyChart. We now offer e-Visits for anyone 16 and older to request care online for non-urgent symptoms. For details visit [email or web address].   Also download the MyChart app! Go to the app store, search "MyChart", open the app, select [facility], and log in with your MyChart username and password.  Due to Covid, a mask is required upon entering the hospital/clinic. If you do not have a mask, one will be given to you upon arrival. For doctor visits, patients may have 1 support person aged 35 or older with them. For treatment visits, patients cannot have anyone with them due to current Covid guidelines and our immunocompromised population.

## 2021-09-20 ENCOUNTER — Telehealth: Payer: Self-pay | Admitting: Hematology

## 2021-09-20 LAB — T4: T4, Total: 10.8 ug/dL (ref 4.5–12.0)

## 2021-09-20 NOTE — Telephone Encounter (Signed)
Left message with rescheduled upcoming appointments per 10/31 los.

## 2021-09-21 ENCOUNTER — Other Ambulatory Visit: Payer: Self-pay

## 2021-09-21 ENCOUNTER — Inpatient Hospital Stay: Payer: Medicare HMO | Attending: Hematology

## 2021-09-21 VITALS — BP 133/76 | HR 100 | Temp 98.7°F | Resp 17

## 2021-09-21 DIAGNOSIS — Z5112 Encounter for antineoplastic immunotherapy: Secondary | ICD-10-CM | POA: Insufficient documentation

## 2021-09-21 DIAGNOSIS — Z7189 Other specified counseling: Secondary | ICD-10-CM

## 2021-09-21 DIAGNOSIS — Z5189 Encounter for other specified aftercare: Secondary | ICD-10-CM | POA: Diagnosis not present

## 2021-09-21 DIAGNOSIS — C221 Intrahepatic bile duct carcinoma: Secondary | ICD-10-CM | POA: Diagnosis not present

## 2021-09-21 DIAGNOSIS — Z5111 Encounter for antineoplastic chemotherapy: Secondary | ICD-10-CM | POA: Diagnosis not present

## 2021-09-21 DIAGNOSIS — Z95828 Presence of other vascular implants and grafts: Secondary | ICD-10-CM

## 2021-09-21 MED ORDER — SODIUM CHLORIDE 0.9% FLUSH
10.0000 mL | Freq: Once | INTRAVENOUS | Status: AC
  Administered 2021-09-21: 10 mL

## 2021-09-21 MED ORDER — HEPARIN SOD (PORK) LOCK FLUSH 100 UNIT/ML IV SOLN
500.0000 [IU] | Freq: Once | INTRAVENOUS | Status: AC
Start: 2021-09-21 — End: 2021-09-21
  Administered 2021-09-21: 500 [IU]

## 2021-09-21 MED ORDER — PEGFILGRASTIM-JMDB 6 MG/0.6ML ~~LOC~~ SOSY
6.0000 mg | PREFILLED_SYRINGE | Freq: Once | SUBCUTANEOUS | Status: AC
  Administered 2021-09-21: 6 mg via SUBCUTANEOUS
  Filled 2021-09-21: qty 0.6

## 2021-09-26 DIAGNOSIS — H25812 Combined forms of age-related cataract, left eye: Secondary | ICD-10-CM | POA: Diagnosis not present

## 2021-10-03 ENCOUNTER — Other Ambulatory Visit: Payer: Medicare HMO

## 2021-10-03 ENCOUNTER — Ambulatory Visit: Payer: Medicare HMO | Admitting: Nurse Practitioner

## 2021-10-03 ENCOUNTER — Ambulatory Visit: Payer: Medicare HMO

## 2021-10-03 DIAGNOSIS — H25042 Posterior subcapsular polar age-related cataract, left eye: Secondary | ICD-10-CM | POA: Diagnosis not present

## 2021-10-03 DIAGNOSIS — H25811 Combined forms of age-related cataract, right eye: Secondary | ICD-10-CM | POA: Diagnosis not present

## 2021-10-03 DIAGNOSIS — H40003 Preglaucoma, unspecified, bilateral: Secondary | ICD-10-CM | POA: Diagnosis not present

## 2021-10-03 DIAGNOSIS — H2511 Age-related nuclear cataract, right eye: Secondary | ICD-10-CM | POA: Diagnosis not present

## 2021-10-03 DIAGNOSIS — H2513 Age-related nuclear cataract, bilateral: Secondary | ICD-10-CM | POA: Diagnosis not present

## 2021-10-05 ENCOUNTER — Telehealth: Payer: Self-pay

## 2021-10-07 ENCOUNTER — Encounter: Payer: Self-pay | Admitting: Hematology

## 2021-10-07 NOTE — Telephone Encounter (Signed)
Chart Review only.  No changes made at this time.

## 2021-10-09 NOTE — Progress Notes (Signed)
[name]   Telephone:([phone]) [phone] Fax:(336) [phone]   Clinic Follow up Note   Patient Care Team: [name], PA as PCP - General (Family Medicine) Lorretta Harp, MD as PCP - Cardiology (Cardiology) [name], MD as Consulting Physician (General Surgery) [name], [name], MD as Consulting Physician (Gastroenterology) [name], MD as Consulting Physician (Hematology) Lorretta Harp, MD as Consulting Physician (Cardiology) 10/10/2021  CHIEF COMPLAINT: Follow up cholangiocarcinoma   SUMMARY OF ONCOLOGIC HISTORY: Oncology History Overview Note  Cancer Staging Intrahepatic cholangiocarcinoma ([place]) Staging form: Intrahepatic Bile Duct, AJCC 8th Edition - Clinical stage from 08/19/2019: Stage II (cT2, cN0, cM0) - Signed by [name], MD on 08/19/2019    Intrahepatic cholangiocarcinoma ([place])  06/28/2019 Imaging   CT AP W Contrast 06/28/19  IMPRESSION: 1. Heterogeneous hypodensity posteriorly in the right hepatic lobe and potentially extending into the caudate lobe suspicious for a mass. There is felt to be truncation of branches of the portal vein in this vicinity and some narrowing of the hepatic vein, as well as triangular-shaped regions of abnormal hypoenhancement posteriorly in the right hepatic lobe likely representing downstream vascular effects. Cannot exclude malignancy such as cholangiocarcinoma or hepatocellular carcinoma, and follow up hepatic protocol MRI with and without contrast is recommended to further characterize. 2. 4 mm right middle lobe pulmonary nodule is likely benign but may merit surveillance. 3. Cholelithiasis. 4.  Aortic Atherosclerosis (ICD10-I70.0). 5. Prostatomegaly. 6. Mild impingement at L3-4 and L4-5.   07/31/2019 Imaging   MRI Liver 07/31/19 IMPRESSION: 1. 7.3 cm in long axis mass in the right hepatic lobe spanning into the caudate lobe, high suspicion for malignancy such as hepatocellular carcinoma or  cholangiocarcinoma. Suspected effacement or occlusion of the right hepatic vein and posterior branches of the right portal vein. Two smaller tumor nodules along the posterior periphery of the dominant mass. Tissue diagnosis is recommended. 2. No findings of pathologic adenopathy or distant metastatic spread. 3. 9 mm gallstone in the gallbladder. There is mild gallbladder wall thickening which may be from nondistention, correlate clinically in assessing for cholecystitis. 4.  Aortic Atherosclerosis (ICD10-I70.0). 5. Mild diffuse hepatic steatosis.   08/11/2019 Initial Biopsy   DIAGNOSIS: 08/11/19  A. LIVER, RIGHT, BIOPSY:  - Adenocarcinoma.   08/18/2019 Imaging   CT Chest 08/18/19  IMPRESSION: 1. Multiple pulmonary nodules largest at approximately 7 mm in the right lower lobe, nonspecific but concerning given findings in the liver. 2. No signs of definitive metastatic disease, also with mildly enlarged upper abdominal lymph nodes as discussed.   Aortic Atherosclerosis (ICD10-I70.0).   08/19/2019 Initial Diagnosis   Intrahepatic cholangiocarcinoma ([place])   08/19/2019 Cancer Staging   Staging form: Intrahepatic Bile Duct, AJCC 8th Edition - Clinical stage from 08/19/2019: Stage II (cT2, cN0, cM0) - Signed by [name], MD on 08/19/2019    09/29/2019 - 07/19/2020 Chemotherapy   Cisplatin and Gemcitabine 2 weeks on/1 week off starting 09/29/19. Cisplatin held from cycle 9 (03/23/20) due to fluid status/Afib. He is now on maintenance Gemcitabine. Held after 07/19/20 to proceed with liver target therapy.     10/09/2019 Imaging   CT AP IMPRESSION: 1. The dominant right hepatic lobe mass is minimally reduced in size compared to prior exams, currently measuring 6.2 by 5.3 cm, previously 6.2 by 5.6 cm. However, there is a new small hypodense lesion centrally in the right hepatic lobe which is suspicious for a new small focus of tumor. Accordingly this is an overall mixed appearance. 2. Continued  hypoenhancement in the liver downstream of the tumor likely attributable to narrowing or occlusion of the right hepatic vein by the tumor. By virtue of its location the tumor wraps around the intrahepatic portion of the IVC. 3. 4 mm right middle lobe pulmonary nodule, stable compared to earliest available comparison of 07/18/2019. Surveillance of the patient's pulmonary nodules is recommended. 4. Other imaging findings of potential clinical significance: Coronary atherosclerosis. Cholelithiasis. Prominent stool throughout the colon favors constipation. Moderate prostatomegaly with heterogeneous enhancement of the prostate gland. Lumbar spondylosis and degenerative disc disease causing mild bilateral foraminal impingement at L3-4 and L4-5.   Aortic Atherosclerosis (ICD10-I70.0).   12/24/2019 Imaging   CT CAP W Contrast  IMPRESSION: 1. Right hepatic lobe mass and adjacent right hepatic lobe nodules appear grossly stable. No evidence of distant metastatic disease. 2. Continued stability of small pulmonary nodules. Recommend attention on follow-up. 3. Cholelithiasis. 4. Enlarged prostate. 5. Aortic atherosclerosis (ICD10-I70.0). Coronary artery calcification.   02/23/2020 Procedure   He had PAC placed on 02/23/20.    04/08/2020 Imaging   CT CAP  IMPRESSION: 1. Stable or minimally decreased size of a very ill-defined, hypodense and somewhat retractile appearing mass of the central right lobe of the liver abutting the inferior vena cava and right portal vein, measuring approximately 5.7 x 5.0 cm, previously 6.2 x 5.2 cm when measured similarly. Findings are consistent with stable or minimally improved cholangiocarcinoma. 2. Small hypodense nodules of the right lobe identified on prior examination are poorly appreciated on this single phase contrast examination although not grossly changed. Attention on follow-up. 3. There are new, moderate bilateral pleural effusions and associated atelectasis  or consolidation as well as a new small pericardial effusion, nonspecific although generally concerning and suspicious for malignant effusions. There is no directly visualized pleural nodularity. 4. Multiple small pulmonary nodules are stable.  No new nodules. 5. Coronary artery disease. Aortic Atherosclerosis (ICD10-I70.0).   07/06/2020 Imaging   MRI ABD IMPRESSION: 1. Interval decrease in size of the right hepatic lobe lesion in the medial aspect of the segment 7. Findings would suggest a good response to treatment with some contraction of the tumor. 2. No new hepatic lesions. No abdominal adenopathy or metastatic disease. 3. Stable mild intrahepatic biliary dilatation in the right hepatic lobe distal to the lesion. 4. Somewhat tortuous and almost beaded appearance of the hepatic and portal vein radicles. Findings could be radiation related. 5. Cholelithiasis.  CT chest wo contrast IMPRESSION: 1. Persistent/stable moderate-sized bilateral pleural effusions with overlying atelectasis. 2. Stable small right pulmonary nodules. No new or progressive findings. Recommend continued surveillance. 3. No mediastinal or hilar mass or adenopathy. 4. Stable advanced three-vessel coronary artery calcifications. 5. Cholelithiasis. Aortic Atherosclerosis (ICD10-I70.0)   08/11/2020 Procedure   Y90 on 08/11/20 and 08/26/20 with Dr Laurence Ferrari    11/30/2020 Imaging   MRI Abdomen  IMPRESSION: 1. Today's study demonstrates progression of disease with enlarging central lesion in the right lobe of the liver involving portions of segments 7 and 8, now with evidence of some tumor thrombus extension into the intrahepatic portion of the inferior vena cava. This is associated with increasing intrahepatic biliary ductal dilatation in segment 7 of the liver. 2. In addition, there is some subtle hyperenhancement of the distal common bile duct at and immediately proximally to the level of the ampulla. There is  also some subtle delayed enhancement around this region in the pancreatic head. This is of uncertain etiology and significance, but may be inflammatory and currently is not  associated with proximal common bile duct dilatation. However, close attention on follow-up imaging is recommended. 3. Cholelithiasis without evidence of acute cholecystitis at this time. 4. Aortic atherosclerosis.     12/24/2020 - 03/21/2021 Chemotherapy   Restart Gemcitabine 2 weeks on/1 week off given disease progression beginning 12/24/20. Discontinued after 03/21/21 due to disease progression in liver.     03/16/2021 Imaging   MRI abdomen  IMPRESSION: No significant change in size of irregular hypovascular mass in the medial right hepatic lobe.   New 1.1 cm hypovascular lesion with peripheral rim enhancement in liver dome, suspicious for metastatic disease.   New small right pleural effusion and mild ascites.   Cholelithiasis, without evidence of cholecystitis or biliary dilatation.   04/05/2021 -  Chemotherapy   Second-line FOLFOX q2weeks starting 04/05/21    04/05/2021 -  Chemotherapy   Immunotherapy with Durvalumab (Imfinzi) q4weeks starting 04/05/21. (in addition to second line FOLFOX)   06/23/2021 Imaging   MRI Abdomen IMPRESSION: 1. No significant change in subcapsular mass of the posterior right lobe of the liver. 2. No significant change in an additional small hyperenhancing lesion of the liver dome, which remains suspicious for a satellite lesion. 3. No new liver lesions. 4. 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 5. Cholelithiasis without evidence of acute cholecystitis. 6. Trace ascites.  CT Chest w/o contrast IMPRESSION: 1. Occasional small pulmonary nodules in the right lung are stable, for example a 6 mm nodule right lower lobe and a 3 mm nodule of the lateral segment right middle lobe. These are nonspecific and likely benign, incidental sequelae of infection or inflammation,  metastatic disease not favored. Attention on follow-up. 2. Previously seen bilateral pleural effusions are resolved. 3. Hypodense lesion of the posterior right lobe of the liver, keeping with known cholangiocarcinoma and better characterized by same day MR. 4. Coronary artery disease.     CURRENT THERAPY: Second-line FOLFOX q2weeks and Durvalumab (Imfinzi) q4weeks starting 04/05/21  INTERVAL HISTORY: Mr. Romagnoli returns for follow up and treatment as scheduled. Last seen by me 09/19/21 and received another cycle of FOLFOX. Last durvalumab 09/05/21.  He feels well without specific complaints.  Cold sensitivity last 3 days, visual neuropathy is stable.  He functions normally, not dropping things, no balance issue or fall.  He bruises easily on his arms, denies bleeding.  Energy and appetite are normal, bowels moving well.  Denies nausea/vomiting, pain, mucositis, rash, fever, chills, cough, chest pain, dyspnea, leg edema, or any other new complaints.   MEDICAL HISTORY:  Past Medical History:  Diagnosis Date   Arthritis    Diabetes ([place])    GERD (gastroesophageal reflux disease)    Hyperlipidemia    Hypertension    Intrahepatic cholangiocarcinoma ([place])     SURGICAL HISTORY: Past Surgical History:  Procedure Laterality Date   APPENDECTOMY  1980   CARDIOVERSION N/A 04/27/2020   Procedure: CARDIOVERSION;  Surgeon: Dorothy Spark, MD;  Location: [facility];  Service: Cardiovascular;  Laterality: N/A;   CARDIOVERSION N/A 05/19/2020   Procedure: CARDIOVERSION;  Surgeon: Elouise Munroe, MD;  Location: [facility];  Service: Cardiovascular;  Laterality: N/A;   COLONOSCOPY     IR 3D INDEPENDENT WKST  08/11/2020   IR 3D INDEPENDENT WKST  08/11/2020   IR ANGIOGRAM SELECTIVE EACH ADDITIONAL VESSEL  08/11/2020   IR ANGIOGRAM SELECTIVE EACH ADDITIONAL VESSEL  08/11/2020   IR ANGIOGRAM SELECTIVE EACH ADDITIONAL VESSEL  08/11/2020   IR ANGIOGRAM SELECTIVE EACH ADDITIONAL VESSEL  08/11/2020   IR  ANGIOGRAM SELECTIVE EACH ADDITIONAL VESSEL  08/11/2020   IR ANGIOGRAM SELECTIVE EACH ADDITIONAL VESSEL  08/11/2020   IR ANGIOGRAM SELECTIVE EACH ADDITIONAL VESSEL  08/26/2020   IR ANGIOGRAM SELECTIVE EACH ADDITIONAL VESSEL  08/26/2020   IR ANGIOGRAM VISCERAL SELECTIVE  08/11/2020   IR ANGIOGRAM VISCERAL SELECTIVE  08/26/2020   IR EMBO ARTERIAL NOT HEMORR HEMANG INC GUIDE ROADMAPPING  08/11/2020   IR EMBO TUMOR ORGAN ISCHEMIA INFARCT INC GUIDE ROADMAPPING  08/26/2020   IR IMAGING GUIDED PORT INSERTION  02/23/2020   IR RADIOLOGIST EVAL & MGMT  07/14/2020   IR RADIOLOGIST EVAL & MGMT  09/30/2020   IR RADIOLOGIST EVAL & MGMT  12/01/2020   IR US GUIDE VASC ACCESS LEFT  08/26/2020   IR US GUIDE VASC ACCESS RIGHT  08/11/2020    I have reviewed the social history and family history with the patient and they are unchanged from previous note.  ALLERGIES:  has No Known Allergies.  MEDICATIONS:  Current Outpatient Medications  Medication Sig Dispense Refill   allopurinol (ZYLOPRIM) 100 MG tablet Take 100 mg by mouth daily.     colchicine 0.6 MG tablet Take 1 tablet (0.6 mg total) by mouth daily as needed (gout flare). Take daily for 3 days, then as needed for gout flare 30 tablet 0   diltiazem (CARDIZEM CD) 240 MG 24 hr capsule TAKE 1 CAPSULE BY MOUTH EVERY DAY, STOP AMLODIPINE 90 capsule 3   ELIQUIS 5 MG TABS tablet TAKE 1 TABLET TWICE DAILY 180 tablet 1   fenofibrate (TRICOR) 145 MG tablet Take 145 mg by mouth daily.     finasteride (PROSCAR) 5 MG tablet Take 5 mg by mouth daily.     furosemide (LASIX) 40 MG tablet TAKE 2 TABLETS (80 MG TOTAL) BY MOUTH 2 (TWO) TIMES DAILY. 360 tablet 3   gabapentin (NEURONTIN) 100 MG capsule Take 100 mg by mouth at bedtime.     hydrALAZINE (APRESOLINE) 50 MG tablet Take 50 mg by mouth 2 (two) times daily.     KLOR-CON M20 20 MEQ tablet TAKE 1 TABLET BY MOUTH TWICE A DAY 180 tablet 3   lidocaine-prilocaine (EMLA) cream Apply 1 application topically as needed. 30 g 3    metoprolol succinate (TOPROL XL) 25 MG 24 hr tablet Take 1 tablet (25 mg total) by mouth daily. 90 tablet 3   metoprolol tartrate (LOPRESSOR) 50 MG tablet      pantoprazole (PROTONIX) 40 MG tablet TAKE 1 TABLET BY MOUTH TWICE A DAY 180 tablet 2   pravastatin (PRAVACHOL) 40 MG tablet Take 40 mg by mouth daily.     sucralfate (CARAFATE) 1 g tablet Take 1 tablet (1 g total) by mouth every 6 (six) hours as needed. Please schedule a yearly follow up for further refills: [phone] (Patient not taking: Reported on 10/10/2021) 60 tablet 0   tamsulosin (FLOMAX) 0.4 MG CAPS capsule Take 0.4 mg by mouth daily.     No current facility-administered medications for this visit.   Facility-Administered Medications Ordered in Other Visits  Medication Dose Route Frequency Provider Last Rate Last Admin   0.9 %  sodium chloride infusion   Intravenous Once [name], MD       dextrose 5 % solution   Intravenous Once [name], MD       durvalumab [facility]) 1,500 mg in sodium chloride 0.9 % 100 mL chemo infusion  1,500 mg Intravenous Once [name], MD       fluorouracil (ADRUCIL) 4,400 mg in sodium  chloride 0.9 % 62 mL chemo infusion  2,000 mg/m2 (Treatment Plan Recorded) Intravenous 1 day or 1 dose [name], MD       heparin lock flush 100 unit/mL  500 Units Intracatheter Once PRN [name], MD       leucovorin 880 mg in dextrose 5 % 250 mL infusion  400 mg/m2 (Treatment Plan Recorded) Intravenous Once [name], MD       oxaliplatin (ELOXATIN) 110 mg in dextrose 5 % 500 mL chemo infusion  50 mg/m2 (Treatment Plan Recorded) Intravenous Once [name], MD       sodium chloride flush (NS) 0.9 % injection 10 mL  10 mL Intracatheter PRN [name], MD        PHYSICAL EXAMINATION: ECOG PERFORMANCE STATUS: 1 - Symptomatic but completely ambulatory See infusion flowsheet for vital signs There were no vitals filed for this visit. There were no vitals filed for this visit.  GENERAL:alert, no distress and  comfortable SKIN: No rash.  Scattered ecchymoses to bilateral forearms EYES: sclera clear LUNGS:  normal breathing effort HEART: regular rate & rhythm, no lower extremity edema ABDOMEN:abdomen soft, non-tender and normal bowel sounds NEURO: alert & oriented x 3 with fluent speech PAC without erythema  LABORATORY DATA:  I have reviewed the data as listed CBC Latest Ref Rng & Units 10/10/2021 09/19/2021 09/05/2021  WBC 4.0 - 10.5 K/uL 5.3 7.9 6.0  Hemoglobin 13.0 - 17.0 g/dL 10.8(L) 10.5(L) 10.8(L)  Hematocrit 39.0 - 52.0 % 33.2(L) 32.7(L) 33.8(L)  Platelets 150 - 400 K/uL 101(L) 104(L) 89(L)     CMP Latest Ref Rng & Units 10/10/2021 09/19/2021 09/05/2021  Glucose 70 - 99 mg/dL 183(H) 137(H) 129(H)  BUN 8 - 23 mg/dL 18 16 13   Creatinine 0.61 - 1.24 mg/dL 1.55(H) 1.47(H) 1.52(H)  Sodium 135 - 145 mmol/L 138 138 139  Potassium 3.5 - 5.1 mmol/L 4.2 4.3 4.0  Chloride 98 - 111 mmol/L 110 108 110  CO2 22 - 32 mmol/L 20(L) 23 20(L)  Calcium 8.9 - 10.3 mg/dL 9.1 9.4 9.4  Total Protein 6.5 - 8.1 g/dL 7.0 7.1 6.9  Total Bilirubin 0.3 - 1.2 mg/dL 0.4 0.5 0.5  Alkaline Phos 38 - 126 U/L 98 122 101  AST 15 - 41 U/L 42(H) 40 31  ALT 0 - 44 U/L 33 25 22      RADIOGRAPHIC STUDIES: I have personally reviewed the radiological images as listed and agreed with the findings in the report. No results found.   ASSESSMENT & PLAN: Brandon Sherman is a 76 y.o. male with     1. Intrahepatic cholangiocarcinoma, cT2N0Mx, unresectable, with indeterminate lung nodules  -Diagnosed in 07/2019. CT scans and MRI liver show a large 7.3cm mass in the right hepatic lobe which abuts portal vein. Both our local surgeon Dr. Barry Dienes and Dr Carlis Abbott at [facility] concluded that cancer is not resectable, and therefore not likely curable, due to the invasion to portal vein. -He began palliative systemic treatment standard first line chemo with IV Cisplatin and Gemcitabine 2 weeks on/1 week off. Started 09/29/19, tolerating well  cisplatin was held with cycle 9 on 03/23/2020 due to fluid status/A. fib, now on maintenance gemcitabine alone -His FO results showed MSI stable disease, IDH1 mutations he may benefit from IDH inhibitor in the future -he went to single agent gemcitabine q2 weeks on 12/24/20 until disease progression in liver on 03/16/21 MRI with concern for new liver lesion  -Started second line FOLFOX q2 weeks plus  durvalumab q4 weeks starting 04/05/21 -Restaging chest CT and MRI abdomen 06/23/2021 showed stable disease in the liver, small and stable lung nodules, no new lesions -Dose reduced chemo from cycle 8 due to cytopenias, stable and tolerating well -Restaging scans scheduled 10/21/2021   2.  Lacrimation -He previously had blurry vision, began having watery eyes in 08/2021 affecting his vision and driving -Seen by ophthalmology, pending cataract surgery in November -Lacrimation did not improve off Imfinzi, immunotherapy was resumed -Not discussed today   3. Nausea, Low food intake and weight loss   -With pain from GERD and nausea he initially lost 30 pounds.  -followed by dietician -weight fluctuation has some component of fluid retention in lower extremities -stable overall    4. Afib, CHF -diagnosed after PAC placement on 02/23/20. -CHADS2 score 2, moderate risk for stroke. Started on metoprolol and eliquis on 03/01/20. Tolerating well without bleeding.  -He was started on lasix 20 mg every other day after consult with Dr. Gwenlyn Found; his dyspnea and leg edema have improved -He had cardioversion on 05/19/2020 -continue f/u with cardiology, Dr. Gwenlyn Found    5. DM, HTN, HLD, Gout -On Metformin, amlodipine, lisinopril, lasix, allopurinol -Continue to f/u with his PCP  -now on metoprolol and Eliquis for Afib -I previously reduced lasix to 1 tab BID rather than 2 tabs BID -monitoring BP   6. Nicotine Use -He never smoked but has been chewing Tobacco for the past 60 years. He no longer drinks alcohol and tells me he  has quit smoking (11/25/19).    7. GERD and gastritis, history of esophageal candidiasis -He had repeated EGD with Dr. Havery Moros in 07/2019. 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 May repeat EGD in future  -GERD resolved on PPI and carafate  Disposition: Mr. Standish appears stable.  He continues FOLFOX q2 weeks and durvalumab q4 weeks, tolerating treatment very well with stable neuropathy.  He is able to recover and function well with good performance status.  There is no clinical evidence of disease progression.  Labs reviewed, adequate to proceed with FOLFOX and durvalumab today as planned, same doses.  He is scheduled for restaging scans 10/21/2021.  Follow-up in 2 weeks with next FOLFOX.  Orders Placed This Encounter  Procedures   CT Chest Wo Contrast    Standing Status:   Future    Standing Expiration Date:   10/10/2022    Order Specific Question:   Preferred imaging location?    Answer:   [facility]    All questions were answered. The patient knows to call the clinic with any problems, questions or concerns. No barriers to learning were detected.     Alla Feeling, NP 10/10/21

## 2021-10-10 ENCOUNTER — Inpatient Hospital Stay: Payer: Medicare HMO

## 2021-10-10 ENCOUNTER — Encounter: Payer: Self-pay | Admitting: Nurse Practitioner

## 2021-10-10 ENCOUNTER — Other Ambulatory Visit: Payer: Self-pay

## 2021-10-10 ENCOUNTER — Inpatient Hospital Stay (HOSPITAL_BASED_OUTPATIENT_CLINIC_OR_DEPARTMENT_OTHER): Payer: Medicare HMO | Admitting: Nurse Practitioner

## 2021-10-10 VITALS — BP 141/74 | HR 59 | Temp 97.9°F | Resp 16 | Wt 216.8 lb

## 2021-10-10 DIAGNOSIS — Z5111 Encounter for antineoplastic chemotherapy: Secondary | ICD-10-CM | POA: Diagnosis not present

## 2021-10-10 DIAGNOSIS — C221 Intrahepatic bile duct carcinoma: Secondary | ICD-10-CM | POA: Diagnosis not present

## 2021-10-10 DIAGNOSIS — Z5112 Encounter for antineoplastic immunotherapy: Secondary | ICD-10-CM | POA: Diagnosis not present

## 2021-10-10 DIAGNOSIS — Z7189 Other specified counseling: Secondary | ICD-10-CM

## 2021-10-10 DIAGNOSIS — Z5189 Encounter for other specified aftercare: Secondary | ICD-10-CM | POA: Diagnosis not present

## 2021-10-10 LAB — CBC WITH DIFFERENTIAL (CANCER CENTER ONLY)
Abs Immature Granulocytes: 0.08 K/uL — ABNORMAL HIGH (ref 0.00–0.07)
Basophils Absolute: 0.1 K/uL (ref 0.0–0.1)
Basophils Relative: 1 %
Eosinophils Absolute: 0.1 K/uL (ref 0.0–0.5)
Eosinophils Relative: 1 %
HCT: 33.2 % — ABNORMAL LOW (ref 39.0–52.0)
Hemoglobin: 10.8 g/dL — ABNORMAL LOW (ref 13.0–17.0)
Immature Granulocytes: 2 %
Lymphocytes Relative: 11 %
Lymphs Abs: 0.6 K/uL — ABNORMAL LOW (ref 0.7–4.0)
MCH: 33.6 pg (ref 26.0–34.0)
MCHC: 32.5 g/dL (ref 30.0–36.0)
MCV: 103.4 fL — ABNORMAL HIGH (ref 80.0–100.0)
Monocytes Absolute: 0.8 K/uL (ref 0.1–1.0)
Monocytes Relative: 16 %
Neutro Abs: 3.6 K/uL (ref 1.7–7.7)
Neutrophils Relative %: 69 %
Platelet Count: 101 K/uL — ABNORMAL LOW (ref 150–400)
RBC: 3.21 MIL/uL — ABNORMAL LOW (ref 4.22–5.81)
RDW: 16.7 % — ABNORMAL HIGH (ref 11.5–15.5)
WBC Count: 5.3 K/uL (ref 4.0–10.5)
nRBC: 0 % (ref 0.0–0.2)

## 2021-10-10 LAB — CMP (CANCER CENTER ONLY)
ALT: 33 U/L (ref 0–44)
AST: 42 U/L — ABNORMAL HIGH (ref 15–41)
Albumin: 3.4 g/dL — ABNORMAL LOW (ref 3.5–5.0)
Alkaline Phosphatase: 98 U/L (ref 38–126)
Anion gap: 8 (ref 5–15)
BUN: 18 mg/dL (ref 8–23)
CO2: 20 mmol/L — ABNORMAL LOW (ref 22–32)
Calcium: 9.1 mg/dL (ref 8.9–10.3)
Chloride: 110 mmol/L (ref 98–111)
Creatinine: 1.55 mg/dL — ABNORMAL HIGH (ref 0.61–1.24)
GFR, Estimated: 47 mL/min — ABNORMAL LOW (ref >60)
Glucose, Bld: 183 mg/dL — ABNORMAL HIGH (ref 70–99)
Potassium: 4.2 mmol/L (ref 3.5–5.1)
Sodium: 138 mmol/L (ref 135–145)
Total Bilirubin: 0.4 mg/dL (ref 0.3–1.2)
Total Protein: 7 g/dL (ref 6.5–8.1)

## 2021-10-10 LAB — MAGNESIUM: Magnesium: 1.9 mg/dL (ref 1.7–2.4)

## 2021-10-10 MED ORDER — PALONOSETRON HCL INJECTION 0.25 MG/5ML
0.2500 mg | Freq: Once | INTRAVENOUS | Status: AC
  Administered 2021-10-10: 0.25 mg via INTRAVENOUS
  Filled 2021-10-10: qty 5

## 2021-10-10 MED ORDER — SODIUM CHLORIDE 0.9 % IV SOLN
1500.0000 mg | Freq: Once | INTRAVENOUS | Status: AC
  Administered 2021-10-10: 1500 mg via INTRAVENOUS
  Filled 2021-10-10: qty 30

## 2021-10-10 MED ORDER — LEUCOVORIN CALCIUM INJECTION 350 MG
400.0000 mg/m2 | Freq: Once | INTRAVENOUS | Status: AC
  Administered 2021-10-10: 880 mg via INTRAVENOUS
  Filled 2021-10-10: qty 44

## 2021-10-10 MED ORDER — HEPARIN SOD (PORK) LOCK FLUSH 100 UNIT/ML IV SOLN: 500.0000 [IU] | Freq: Once | INTRAVENOUS | Status: DC | PRN

## 2021-10-10 MED ORDER — OXALIPLATIN CHEMO INJECTION 100 MG/20ML
50.0000 mg/m2 | Freq: Once | INTRAVENOUS | Status: AC
  Administered 2021-10-10: 110 mg via INTRAVENOUS
  Filled 2021-10-10: qty 20

## 2021-10-10 MED ORDER — DEXTROSE 5 % IV SOLN: Freq: Once | INTRAVENOUS | Status: AC

## 2021-10-10 MED ORDER — SODIUM CHLORIDE 0.9% FLUSH: 10.0000 mL | INTRAVENOUS | Status: DC | PRN

## 2021-10-10 MED ORDER — SODIUM CHLORIDE 0.9 % IV SOLN: Freq: Once | INTRAVENOUS | Status: AC

## 2021-10-10 MED ORDER — SODIUM CHLORIDE 0.9 % IV SOLN
2000.0000 mg/m2 | INTRAVENOUS | Status: DC
  Administered 2021-10-10: 4400 mg via INTRAVENOUS
  Filled 2021-10-10: qty 88

## 2021-10-10 NOTE — Patient Instructions (Signed)
[facility] ONCOLOGY  Discharge Instructions: Thank you for choosing [facility] to provide your oncology and hematology care.   If you have a lab appointment with the [facility], please go directly to the [facility] and check in at the registration area.   Wear comfortable clothing and clothing appropriate for easy access to any Portacath or PICC line.   We strive to give you quality time with your provider. You may need to reschedule your appointment if you arrive late (15 or more minutes).  Arriving late affects you and other patients whose appointments are after yours.  Also, if you miss three or more appointments without notifying the office, you may be dismissed from the clinic at the provider's discretion.      For prescription refill requests, have your pharmacy contact our office and allow 72 hours for refills to be completed.    Today you received the following chemotherapy and/or immunotherapy agents:   Leucovorin, Oxaliplatin, & Fluorouracil       To help prevent nausea and vomiting after your treatment, we encourage you to take your nausea medication as directed.  BELOW ARE SYMPTOMS THAT SHOULD BE REPORTED IMMEDIATELY: *FEVER GREATER THAN 100.4 F (38 C) OR HIGHER *CHILLS OR SWEATING *NAUSEA AND VOMITING THAT IS NOT CONTROLLED WITH YOUR NAUSEA MEDICATION *UNUSUAL SHORTNESS OF BREATH *UNUSUAL BRUISING OR BLEEDING *URINARY PROBLEMS (pain or burning when urinating, or frequent urination) *BOWEL PROBLEMS (unusual diarrhea, constipation, pain near the anus) TENDERNESS IN MOUTH AND THROAT WITH OR WITHOUT PRESENCE OF ULCERS (sore throat, sores in mouth, or a toothache) UNUSUAL RASH, SWELLING OR PAIN  UNUSUAL VAGINAL DISCHARGE OR ITCHING   Items with * indicate a potential emergency and should be followed up as soon as possible or go to the Emergency Department if any problems should occur.  Please show the CHEMOTHERAPY ALERT CARD or  IMMUNOTHERAPY ALERT CARD at check-in to the Emergency Department and triage nurse.  Should you have questions after your visit or need to cancel or reschedule your appointment, please contact [facility]  Dept: [phone]  and follow the prompts.  Office hours are 8:00 a.m. to 4:30 p.m. Monday - Friday. Please note that voicemails left after 4:00 p.m. may not be returned until the following business day.  We are closed weekends and major holidays. You have access to a nurse at all times for urgent questions. Please call the main number to the clinic Dept: [phone] and follow the prompts.   For any non-urgent questions, you may also contact your provider using MyChart. We now offer e-Visits for anyone 73 and older to request care online for non-urgent symptoms. For details visit [email or web address].   Also download the MyChart app! Go to the app store, search "MyChart", open the app, select [facility], and log in with your MyChart username and password.  Due to Covid, a mask is required upon entering the hospital/clinic. If you do not have a mask, one will be given to you upon arrival. For doctor visits, patients may have 1 support person aged 46 or older with them. For treatment visits, patients cannot have anyone with them due to current Covid guidelines and our immunocompromised population.

## 2021-10-10 NOTE — Progress Notes (Signed)
Patient made aware that provider has ordered an MRI and CT scan for him.  This nurse scheduled scans for December 2.  Patient made aware today while in infusion and provided oral prep and written instructions.  Patient acknowledged understanding.  No further questions or concerns at this time.

## 2021-10-12 ENCOUNTER — Inpatient Hospital Stay: Payer: Medicare HMO

## 2021-10-12 ENCOUNTER — Telehealth: Payer: Self-pay | Admitting: Hematology

## 2021-10-12 ENCOUNTER — Other Ambulatory Visit: Payer: Self-pay

## 2021-10-12 VITALS — BP 140/81 | HR 70 | Temp 99.2°F | Resp 16

## 2021-10-12 DIAGNOSIS — Z95828 Presence of other vascular implants and grafts: Secondary | ICD-10-CM

## 2021-10-12 DIAGNOSIS — C221 Intrahepatic bile duct carcinoma: Secondary | ICD-10-CM | POA: Diagnosis not present

## 2021-10-12 DIAGNOSIS — Z5112 Encounter for antineoplastic immunotherapy: Secondary | ICD-10-CM | POA: Diagnosis not present

## 2021-10-12 DIAGNOSIS — Z5189 Encounter for other specified aftercare: Secondary | ICD-10-CM | POA: Diagnosis not present

## 2021-10-12 DIAGNOSIS — Z7189 Other specified counseling: Secondary | ICD-10-CM

## 2021-10-12 DIAGNOSIS — Z5111 Encounter for antineoplastic chemotherapy: Secondary | ICD-10-CM | POA: Diagnosis not present

## 2021-10-12 MED ORDER — SODIUM CHLORIDE 0.9% FLUSH
10.0000 mL | Freq: Once | INTRAVENOUS | Status: AC
  Administered 2021-10-12: 10 mL

## 2021-10-12 MED ORDER — PEGFILGRASTIM-JMDB 6 MG/0.6ML ~~LOC~~ SOSY
6.0000 mg | PREFILLED_SYRINGE | Freq: Once | SUBCUTANEOUS | Status: AC
  Administered 2021-10-12: 6 mg via SUBCUTANEOUS
  Filled 2021-10-12: qty 0.6

## 2021-10-12 MED ORDER — HEPARIN SOD (PORK) LOCK FLUSH 100 UNIT/ML IV SOLN
500.0000 [IU] | Freq: Once | INTRAVENOUS | Status: AC
  Administered 2021-10-12: 500 [IU]

## 2021-10-12 NOTE — Telephone Encounter (Signed)
Scheduled follow-up appointments per 11/21 los. Patient's wife is aware. 

## 2021-10-17 ENCOUNTER — Other Ambulatory Visit: Payer: Medicare HMO

## 2021-10-17 ENCOUNTER — Other Ambulatory Visit: Payer: Self-pay | Admitting: Hematology

## 2021-10-17 ENCOUNTER — Ambulatory Visit: Payer: Medicare HMO

## 2021-10-17 ENCOUNTER — Ambulatory Visit: Payer: Medicare HMO | Admitting: Hematology

## 2021-10-17 ENCOUNTER — Other Ambulatory Visit: Payer: Self-pay | Admitting: Cardiovascular Disease

## 2021-10-17 ENCOUNTER — Other Ambulatory Visit: Payer: Self-pay | Admitting: Nurse Practitioner

## 2021-10-17 DIAGNOSIS — C221 Intrahepatic bile duct carcinoma: Secondary | ICD-10-CM

## 2021-10-17 DIAGNOSIS — I10 Essential (primary) hypertension: Secondary | ICD-10-CM

## 2021-10-17 DIAGNOSIS — Z7189 Other specified counseling: Secondary | ICD-10-CM

## 2021-10-21 ENCOUNTER — Encounter (HOSPITAL_COMMUNITY): Payer: Self-pay

## 2021-10-21 ENCOUNTER — Encounter: Payer: Self-pay | Admitting: Hematology

## 2021-10-21 ENCOUNTER — Other Ambulatory Visit: Payer: Self-pay

## 2021-10-21 ENCOUNTER — Ambulatory Visit (HOSPITAL_COMMUNITY)
Admission: RE | Admit: 2021-10-21 | Discharge: 2021-10-21 | Disposition: A | Discharge status: Home / Self Care | Payer: Medicare HMO | Source: Ambulatory Visit | Attending: Nurse Practitioner | Admitting: Nurse Practitioner

## 2021-10-21 ENCOUNTER — Ambulatory Visit (HOSPITAL_COMMUNITY)
Admission: RE | Admit: 2021-10-21 | Discharge: 2021-10-21 | Disposition: A | Discharge status: Home / Self Care | Payer: Medicare HMO | Source: Ambulatory Visit | Attending: Hematology | Admitting: Hematology

## 2021-10-21 DIAGNOSIS — D1800 Hemangioma unspecified site: Secondary | ICD-10-CM | POA: Diagnosis not present

## 2021-10-21 DIAGNOSIS — C221 Intrahepatic bile duct carcinoma: Secondary | ICD-10-CM

## 2021-10-21 DIAGNOSIS — K7689 Other specified diseases of liver: Secondary | ICD-10-CM | POA: Diagnosis not present

## 2021-10-21 DIAGNOSIS — K8020 Calculus of gallbladder without cholecystitis without obstruction: Secondary | ICD-10-CM | POA: Diagnosis not present

## 2021-10-21 DIAGNOSIS — I700 Atherosclerosis of aorta: Secondary | ICD-10-CM | POA: Diagnosis not present

## 2021-10-21 DIAGNOSIS — R911 Solitary pulmonary nodule: Secondary | ICD-10-CM | POA: Diagnosis not present

## 2021-10-21 DIAGNOSIS — R918 Other nonspecific abnormal finding of lung field: Secondary | ICD-10-CM | POA: Diagnosis not present

## 2021-10-21 IMAGING — MR MR ABDOMEN WO/W CM
18 series · 48 of 48 positions shown · IV contrast (10 GADAVIST)
Comparison: [DATE]

CLINICAL DATA: Intrahepatic cholangiocarcinoma. Evaluate treatment
response.

EXAM:
MRI ABDOMEN WITHOUT AND WITH CONTRAST
TECHNIQUE: Multiplanar multisequence MR imaging of the abdomen was performed
both before and after the administration of intravenous contrast.
CONTRAST:  10mL GADAVIST GADOBUTROL 1 MMOL/ML IV SOLN

[Series 3: T2 · coronal · 6.0mm · 1.56mm/px · 2 of 41 slices shown (1 of 2)]
[im 1/41]
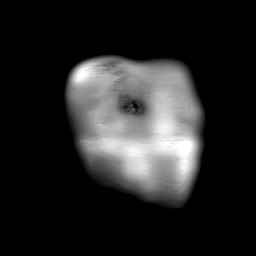
[im 41/41]
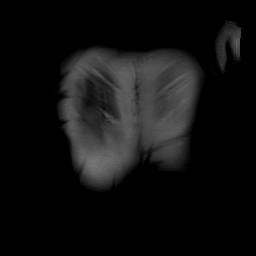

[Series 4: DWI · axial · 6.0mm · 1.49mm/px · z∈[-146,+149]mm · 4 of 83 slices shown (1 of 2)]
[im 1/83]
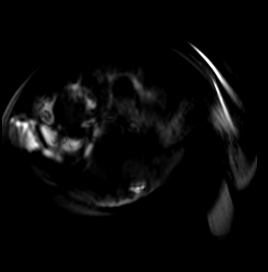
[im 28/83]
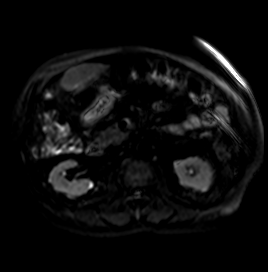
[im 55/83]
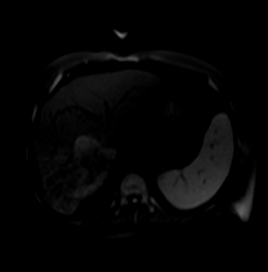
[im 83/83]
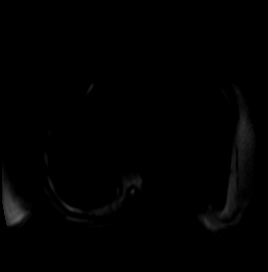

[Series 5: DWI · axial · 6.0mm · 1.49mm/px · z∈[-146,+149]mm · 2 of 42 slices shown (2 of 2)]
[im 1/42]
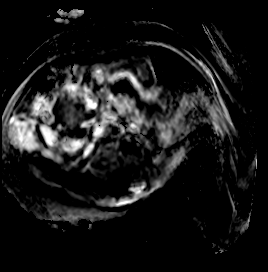
[im 42/42]
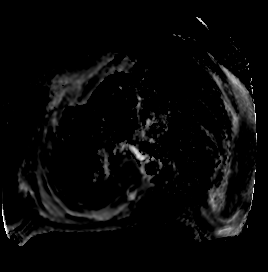

[Series 6: T2 fat-sat · axial · 6.0mm · 1.25mm/px · 1 of 41 slices shown]
[im 1/41]
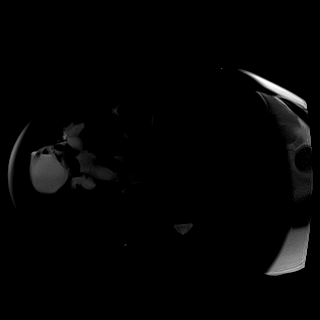

[Series 8: T1 · axial · 3.0mm · 1.25mm/px · z∈[-134,+127]mm · 3 of 88 slices shown (1 of 2)]
[im 1/88]
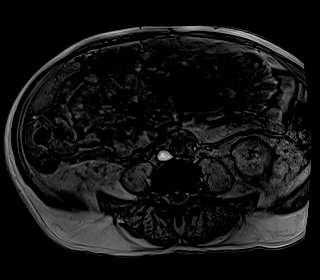
[im 44/88]
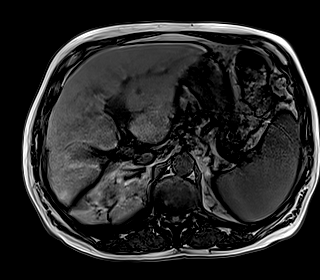
[im 88/88]
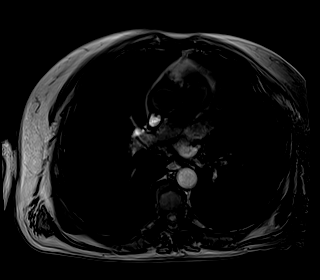

[Series 9: T1 · axial · 3.0mm · 1.25mm/px · z∈[-134,+127]mm · 3 of 88 slices shown (2 of 2)]
[im 1/88]
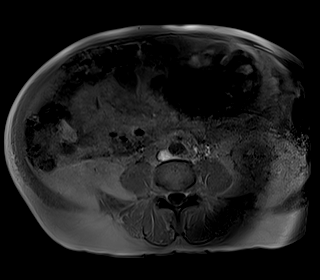
[im 44/88]
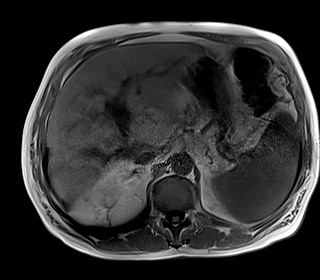
[im 88/88]
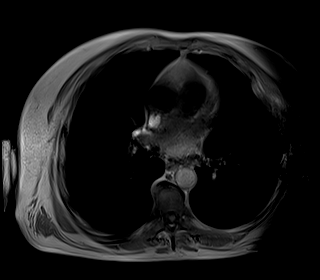

[Series 10: bSSFP · axial · 4.0mm · 0.84mm/px · z∈[-143,+113]mm · 2 of 65 slices shown]
[im 1/65]
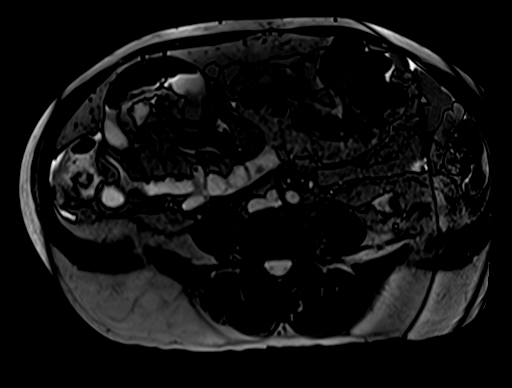
[im 65/65]
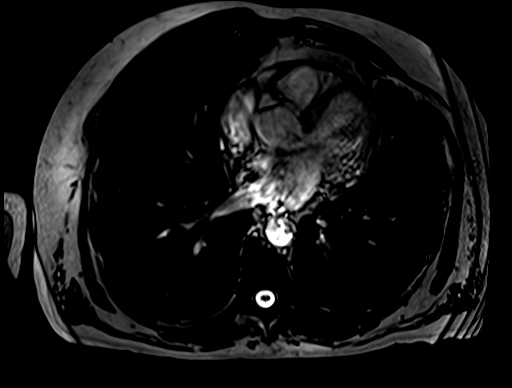

[Series 12: T1 dynamic · axial · 3.0mm · 1.25mm/px · z∈[-139,+122]mm · 3 of 88 slices shown (1 of 10)]
[im 1/88]
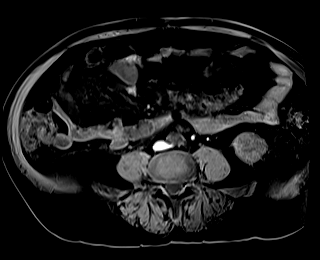
[im 44/88]
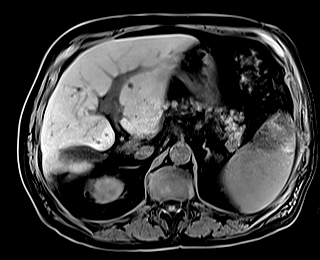
[im 88/88]
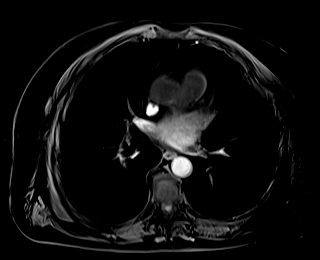

[Series 16: T1 dynamic · axial · 3.0mm · 1.25mm/px · z∈[-139,+122]mm · 3 of 88 slices shown (2 of 10)]
[im 1/88]
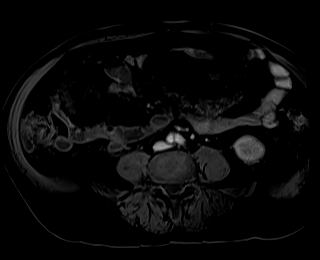
[im 44/88]
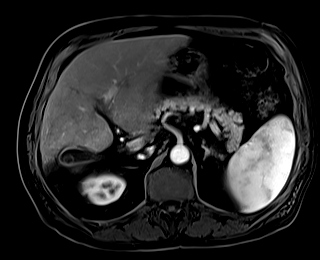
[im 88/88]
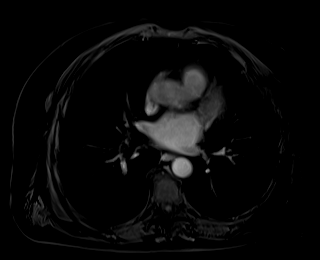

[Series 17: T1 dynamic · axial · 3.0mm · 1.25mm/px · z∈[-139,+122]mm · 3 of 88 slices shown (3 of 10)]
[im 1/88]
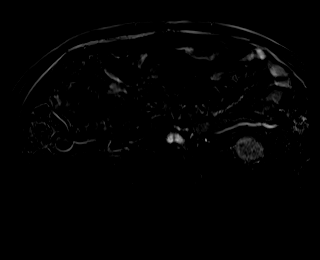
[im 44/88]
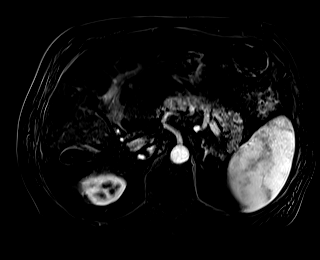
[im 88/88]
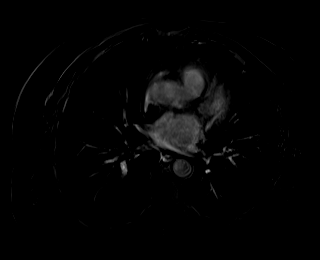

[Series 20: T1 dynamic · axial · 3.0mm · 1.25mm/px · z∈[-139,+122]mm · 3 of 88 slices shown (4 of 10)]
[im 1/88]
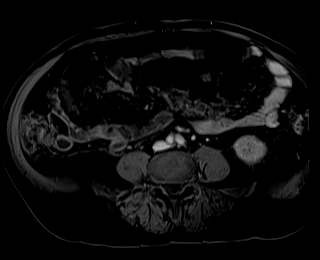
[im 44/88]
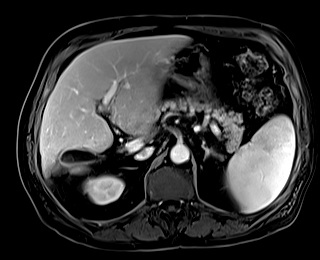
[im 88/88]
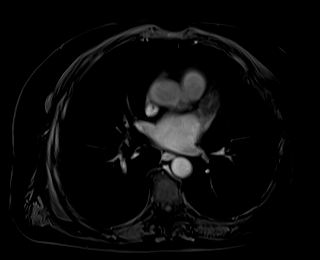

[Series 21: T1 dynamic · axial · 3.0mm · 1.25mm/px · z∈[-139,+122]mm · 3 of 88 slices shown (5 of 10)]
[im 1/88]
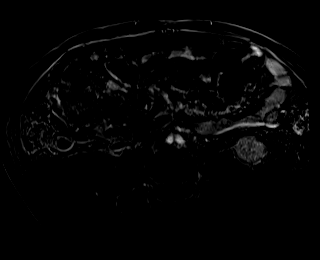
[im 44/88]
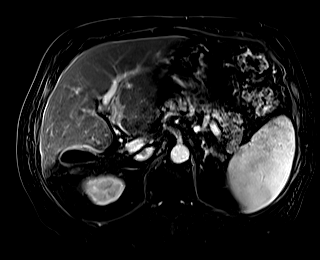
[im 88/88]
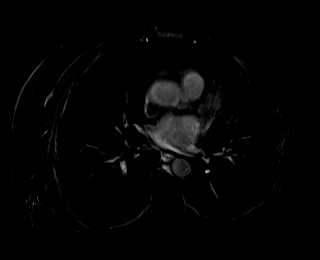

[Series 24: T1 dynamic · axial · 3.0mm · 1.25mm/px · z∈[-139,+122]mm · 3 of 88 slices shown (6 of 10)]
[im 1/88]
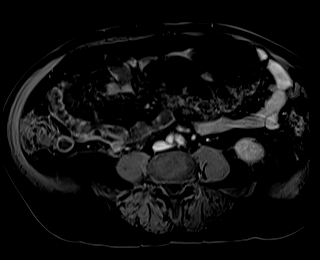
[im 44/88]
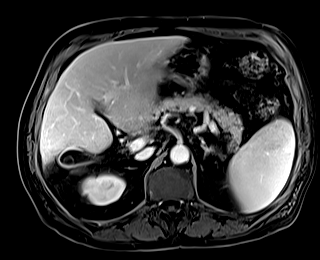
[im 88/88]
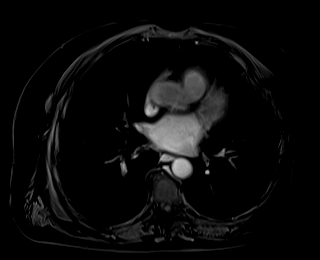

[Series 25: T1 dynamic · axial · 3.0mm · 1.25mm/px · z∈[-139,+122]mm · 3 of 88 slices shown (7 of 10)]
[im 1/88]
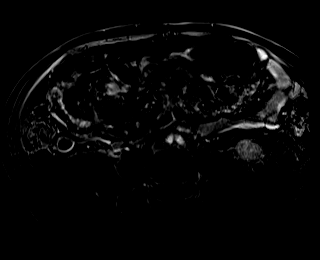
[im 44/88]
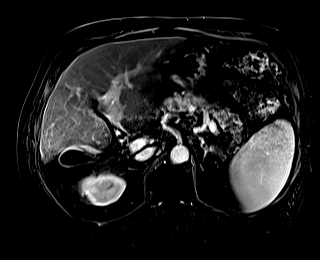
[im 88/88]
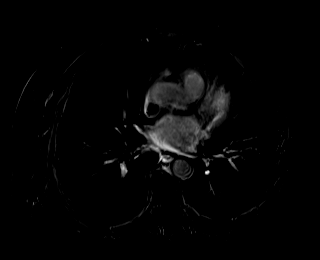

[Series 27: T1 dynamic · coronal · 5.0mm · 1.41mm/px · 2 of 56 slices shown (8 of 10)]
[im 1/56]
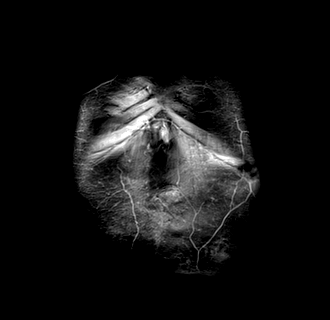
[im 56/56]
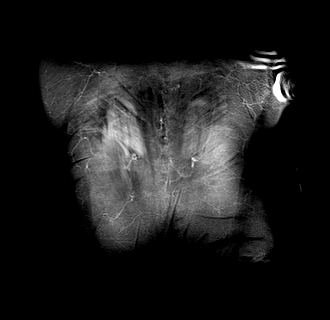

[Series 28: T2 · axial · 6.0mm · 1.56mm/px · z∈[-170,+126]mm · 2 of 42 slices shown (2 of 2)]
[im 1/42]
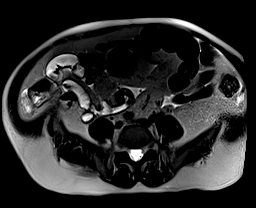
[im 42/42]
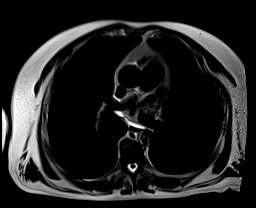

[Series 31: T1 dynamic · axial · 3.0mm · 1.25mm/px · z∈[-139,+122]mm · 3 of 88 slices shown (9 of 10)]
[im 1/88]
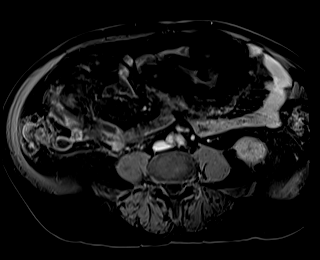
[im 44/88]
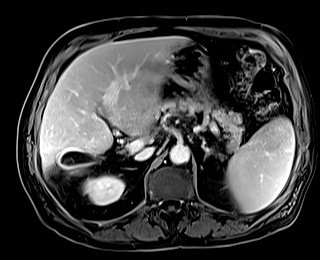
[im 88/88]
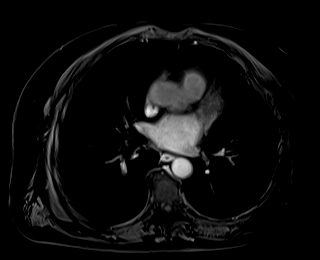

[Series 32: T1 dynamic · axial · 3.0mm · 1.25mm/px · z∈[-139,+122]mm · 3 of 88 slices shown (10 of 10)]
[im 1/88]
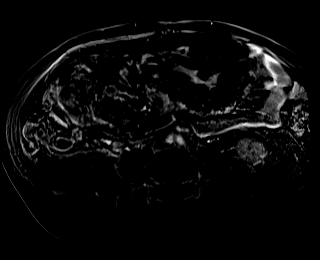
[im 44/88]
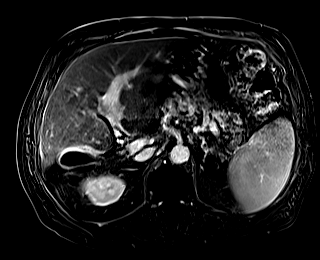
[im 88/88]
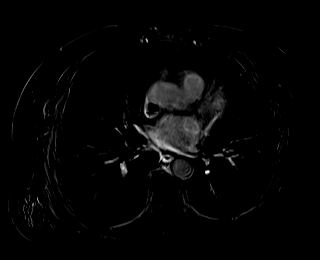

[48 of 48 positions shown; findings below may reference images not displayed]

FINDINGS: Lower chest: Normal heart size without pericardial or pleural
effusion. A right lower lobe pulmonary nodule will be better
characterized and evaluated on chest CT, dictated separately.

Hepatobiliary: Based on arterial phase postcontrast and T2 weighted
imaging, the dominant right hepatic lobe (segment 7 and 8) mass has
progressed. Example at 10.0 x 9.7 cm on 26/16 versus 9.9 x 6.1 cm on
the prior exam when measured similarly. The progression is most
apparent laterally, including on T2 weighted image [DATE]. Extension
superiorly into the hepatic dome including on 19/16 today (compared
to 23/17 of the prior exam).

The hepatic dome satellite lesion is not marked on the prior exam. A
new or progressive hepatic dome lesion is seen including at 1.4 cm
on 21/16 today.

Gallstone of 11 mm without acute cholecystitis.

Pancreas:  Normal, without mass or ductal dilatation.

Spleen:  Normal in size, without focal abnormality.

Adrenals/Urinary Tract: Normal adrenal glands. Bilateral too small
to characterize renal lesions. No hydronephrosis.

Stomach/Bowel: Gastric antral underdistention. Normal large and
small bowel.

Vascular/Lymphatic: Aortic atherosclerosis. The central portion of
the right portal vein is involved with tumor, including on 29/44,
likely similar. The right hepatic vein is also involved with tumor.
Minimal extension into the IVC including on 33/20, grossly similar.

No abdominal adenopathy.

Other:  Trace perihepatic and perisplenic ascites, decreased.

Musculoskeletal: L5 vertebral hemangioma.
IMPRESSION: 1. Mild progression of dominant right hepatic lobe mass with
involvement of the right portal, hepatic veins and minimal extension
into the IVC.
2. Hepatic dome satellite lesion, new or more conspicuous today.
3. Cholelithiasis
4. Decrease in trace perihepatic and perisplenic ascites.
5.  Aortic Atherosclerosis ([0G]-[0G]).

## 2021-10-21 IMAGING — CT CT CHEST W/O CM
2 of 4 series · 15 of 36 positions shown, 18 images · non-contrast
Comparison: [DATE]

CLINICAL DATA: Re-evaluate cholangiocarcinoma.

EXAM:
CT CHEST WITHOUT CONTRAST
TECHNIQUE: Multidetector CT imaging of the chest was performed following the
standard protocol without IV contrast.

[Series 2: thorax · axial · 0.83mm/px · z∈[-373,-69]mm · 12 of 178 slices shown, 15 images]
[im 13/178  mediastinal]
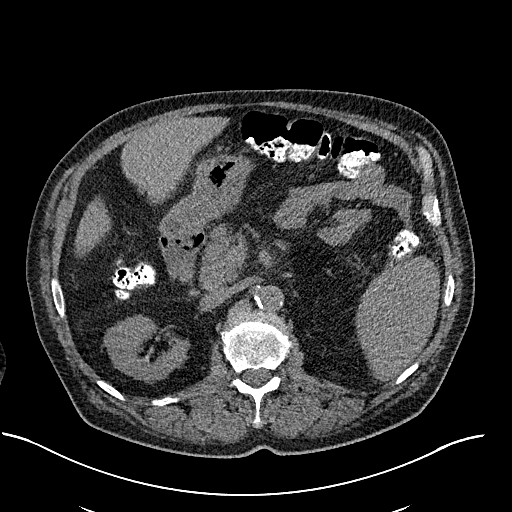
[im 13/178  lung]
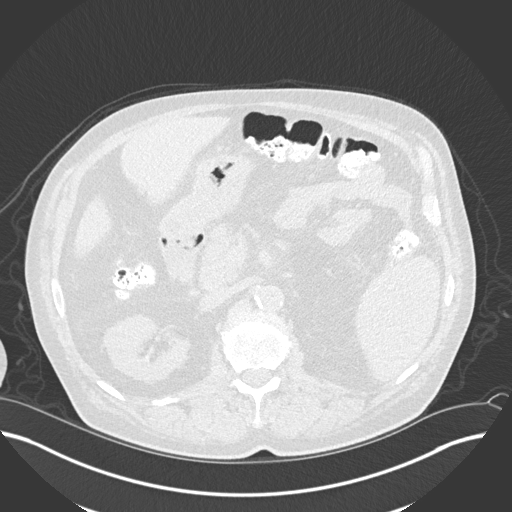
[im 26/178  lung]
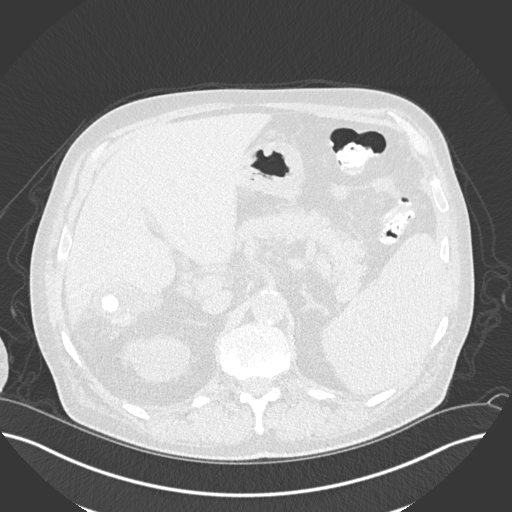
[im 38/178  lung]
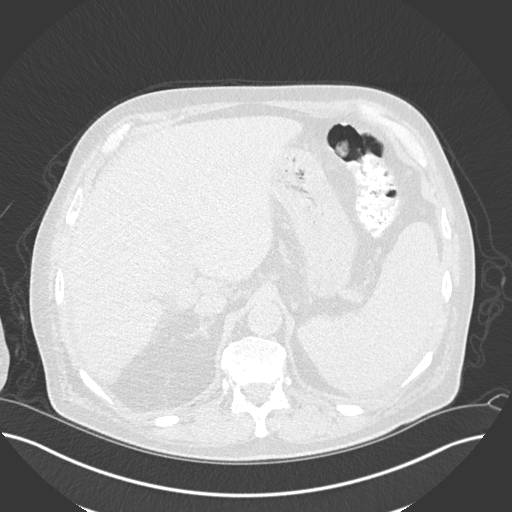
[im 51/178  lung]
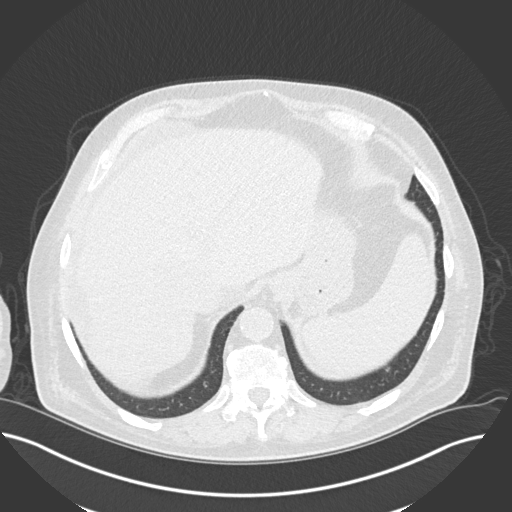
[im 64/178  mediastinal]
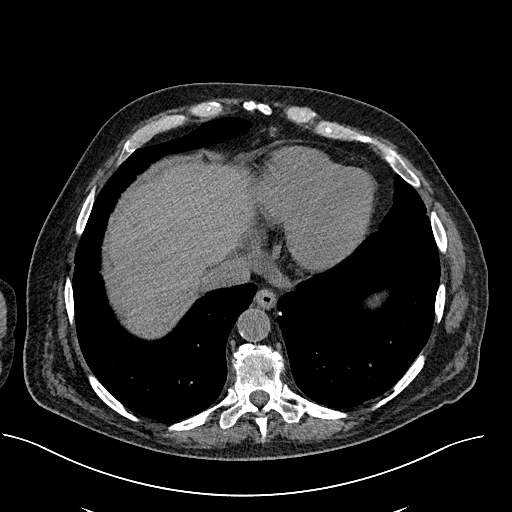
[im 64/178  lung]
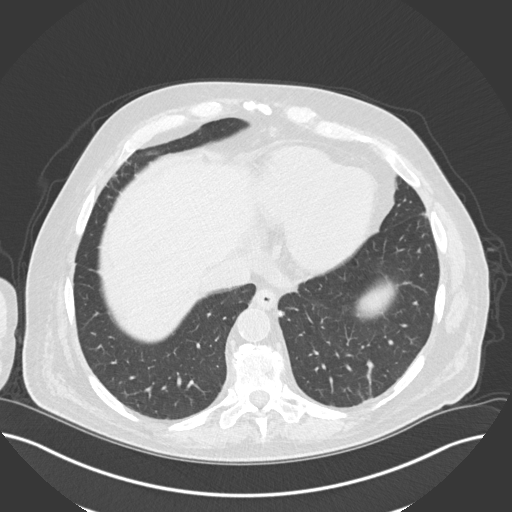
[im 76/178  lung]
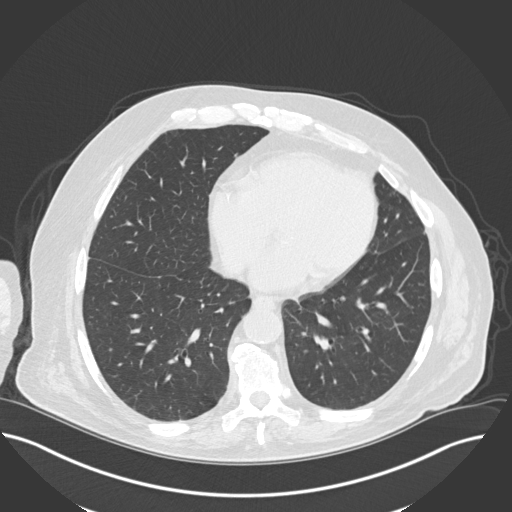
[im 102/178  lung]
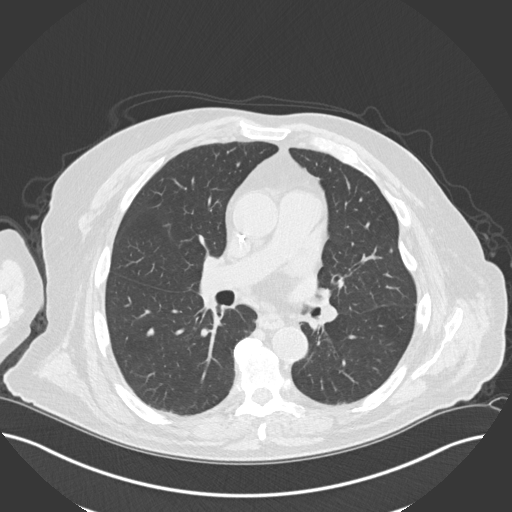
[im 114/178  lung]
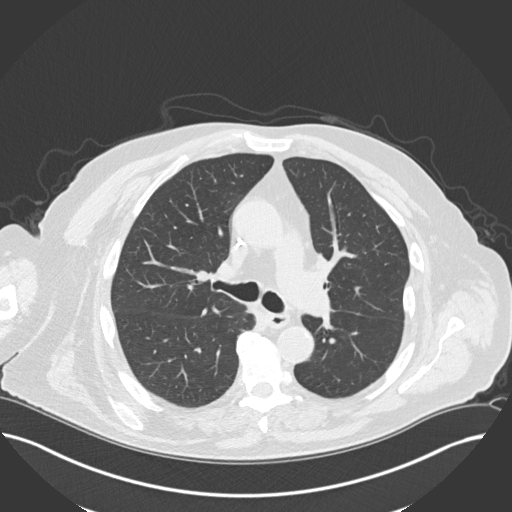
[im 127/178  mediastinal]
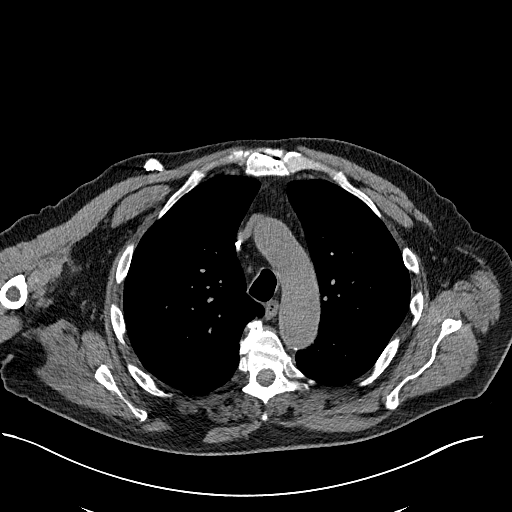
[im 127/178  lung]
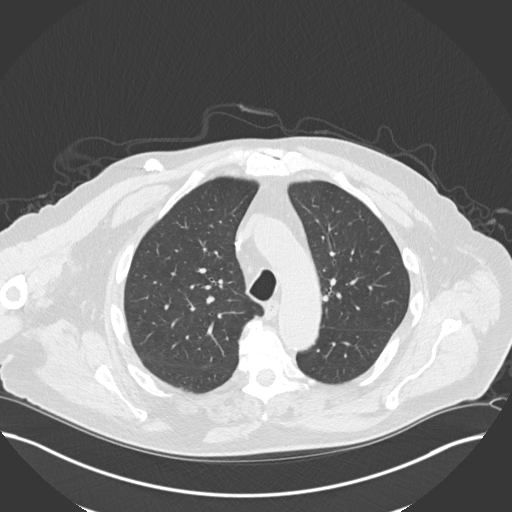
[im 140/178  lung]
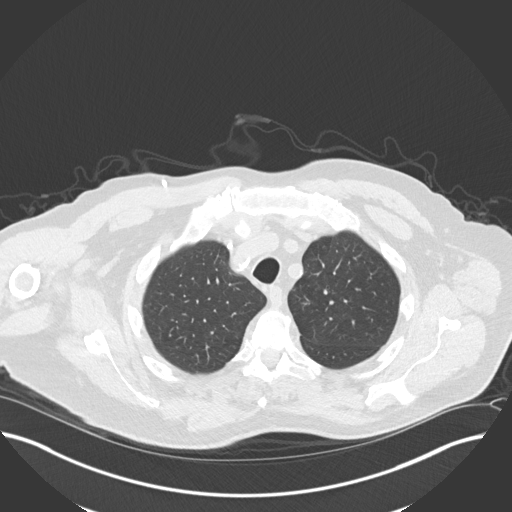
[im 152/178  lung]
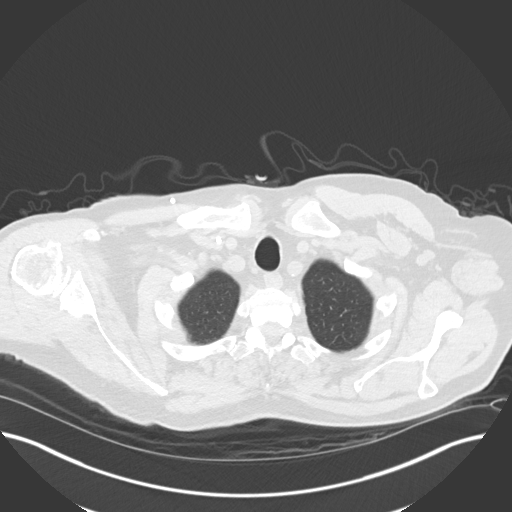
[im 165/178  lung]
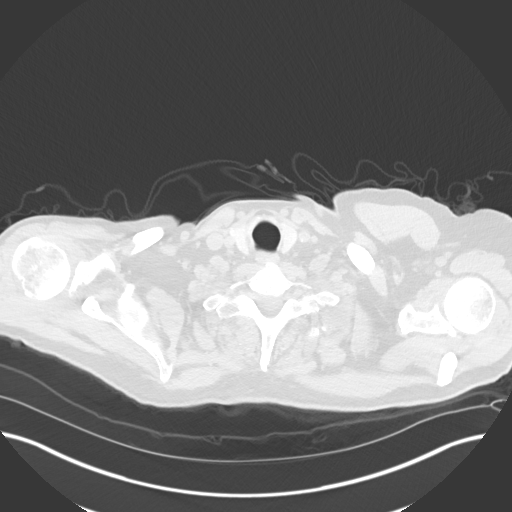

[Series 5: coronal · coronal · 0.72mm/px · 3 of 169 slices shown]
[im 34/169  lung]
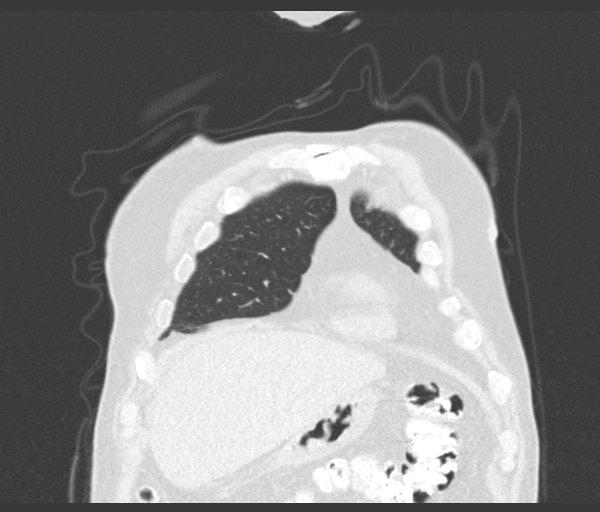
[im 68/169  lung]
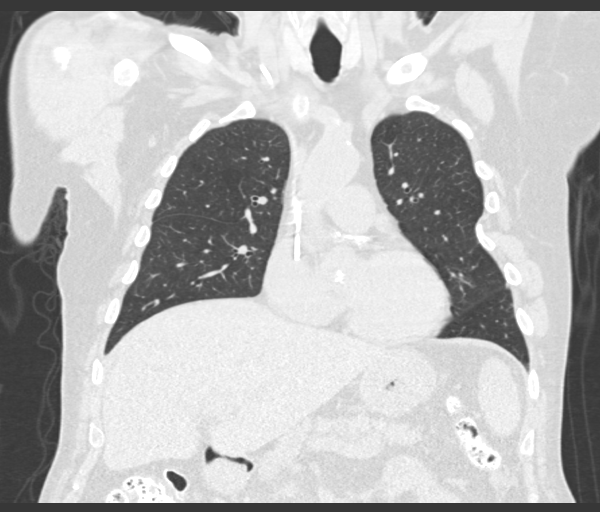
[im 101/169  lung]
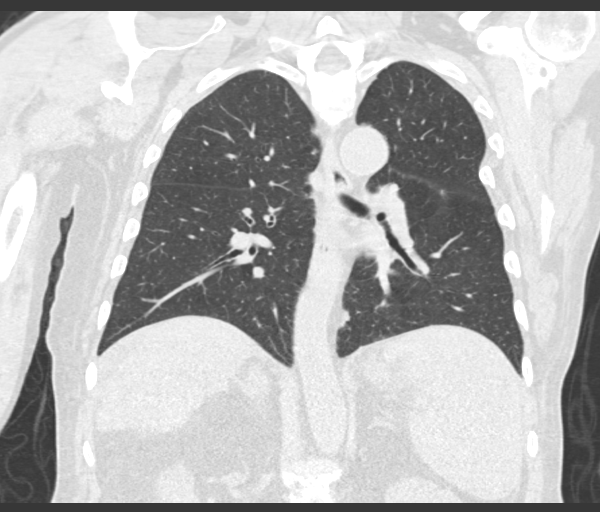

[15 of 36 positions shown; findings below may reference images not displayed]

FINDINGS: Cardiovascular: Right Port-A-Cath tip mid right atrium.

Bovine arch. Aortic atherosclerosis. Normal heart size, without
pericardial effusion. Three vessel coronary artery calcification.

Mediastinum/Nodes: No mediastinal or definite hilar adenopathy,
given limitations of unenhanced CT.

Lungs/Pleura: No pleural fluid. Multiple bilateral pulmonary nodules
are primarily similar. These are identified on series 7. Examples
within the right lower lobe at 7 mm on 76/7 versus similar on the
prior (when remeasured).

3 mm in the right middle lobe on image 106/7 versus similar to on
the prior.

A right lower lobe 7 mm nodule on 118/7 is significantly enlarged
from 3 mm on the prior exam.

A right middle lobe 4 mm nodule on [DATE] have enlarged from 3 mm
on the prior.

Pleural-based left lower lobe soft tissue thickening on 111/7
similar to slightly increased since the prior, favored to represent
scarring.

Upper Abdomen: Deferred to today's abdominal MRI, dictated
separately. Cholelithiasis. Trace upper abdominal ascites. Old
granulomatous disease in the spleen.

Musculoskeletal: Left rib deformities are likely due to remote
trauma. Moderate thoracic spondylosis.
IMPRESSION: 1. Since [DATE], enlargement of a right lower lobe and possible
enlargement of a right middle lobe pulmonary nodule. Findings are
suspicious for metastatic disease.
2. No thoracic adenopathy.
3. Coronary artery atherosclerosis. Aortic Atherosclerosis
([NI]-[NI]).

## 2021-10-21 MED ORDER — GADOBUTROL 1 MMOL/ML IV SOLN
10.0000 mL | Freq: Once | INTRAVENOUS | Status: AC | PRN
  Administered 2021-10-21: 10 mL via INTRAVENOUS

## 2021-10-24 ENCOUNTER — Inpatient Hospital Stay: Payer: Medicare HMO

## 2021-10-24 ENCOUNTER — Other Ambulatory Visit: Payer: Self-pay

## 2021-10-24 ENCOUNTER — Inpatient Hospital Stay: Payer: Medicare HMO | Attending: Hematology

## 2021-10-24 ENCOUNTER — Telehealth: Payer: Self-pay

## 2021-10-24 ENCOUNTER — Inpatient Hospital Stay (HOSPITAL_BASED_OUTPATIENT_CLINIC_OR_DEPARTMENT_OTHER): Payer: Medicare HMO | Admitting: Hematology

## 2021-10-24 VITALS — BP 144/69 | HR 63 | Temp 97.9°F | Resp 18 | Ht 69.0 in | Wt 215.8 lb

## 2021-10-24 DIAGNOSIS — Z79899 Other long term (current) drug therapy: Secondary | ICD-10-CM | POA: Insufficient documentation

## 2021-10-24 DIAGNOSIS — F17220 Nicotine dependence, chewing tobacco, uncomplicated: Secondary | ICD-10-CM | POA: Diagnosis not present

## 2021-10-24 DIAGNOSIS — I4891 Unspecified atrial fibrillation: Secondary | ICD-10-CM | POA: Insufficient documentation

## 2021-10-24 DIAGNOSIS — C221 Intrahepatic bile duct carcinoma: Secondary | ICD-10-CM | POA: Diagnosis not present

## 2021-10-24 DIAGNOSIS — Z7984 Long term (current) use of oral hypoglycemic drugs: Secondary | ICD-10-CM | POA: Insufficient documentation

## 2021-10-24 DIAGNOSIS — I110 Hypertensive heart disease with heart failure: Secondary | ICD-10-CM | POA: Insufficient documentation

## 2021-10-24 DIAGNOSIS — I509 Heart failure, unspecified: Secondary | ICD-10-CM | POA: Insufficient documentation

## 2021-10-24 DIAGNOSIS — Z5111 Encounter for antineoplastic chemotherapy: Secondary | ICD-10-CM | POA: Insufficient documentation

## 2021-10-24 DIAGNOSIS — E119 Type 2 diabetes mellitus without complications: Secondary | ICD-10-CM | POA: Insufficient documentation

## 2021-10-24 DIAGNOSIS — Z5189 Encounter for other specified aftercare: Secondary | ICD-10-CM | POA: Diagnosis not present

## 2021-10-24 DIAGNOSIS — Z95828 Presence of other vascular implants and grafts: Secondary | ICD-10-CM

## 2021-10-24 LAB — CMP (CANCER CENTER ONLY)
ALT: 23 U/L (ref 0–44)
AST: 28 U/L (ref 15–41)
Albumin: 3.5 g/dL (ref 3.5–5.0)
Alkaline Phosphatase: 125 U/L (ref 38–126)
Anion gap: 8 (ref 5–15)
BUN: 18 mg/dL (ref 8–23)
CO2: 21 mmol/L — ABNORMAL LOW (ref 22–32)
Calcium: 9.1 mg/dL (ref 8.9–10.3)
Chloride: 108 mmol/L (ref 98–111)
Creatinine: 1.42 mg/dL — ABNORMAL HIGH (ref 0.61–1.24)
GFR, Estimated: 52 mL/min — ABNORMAL LOW (ref >60)
Glucose, Bld: 163 mg/dL — ABNORMAL HIGH (ref 70–99)
Potassium: 4.1 mmol/L (ref 3.5–5.1)
Sodium: 137 mmol/L (ref 135–145)
Total Bilirubin: 0.5 mg/dL (ref 0.3–1.2)
Total Protein: 7 g/dL (ref 6.5–8.1)

## 2021-10-24 LAB — CBC WITH DIFFERENTIAL (CANCER CENTER ONLY)
Abs Immature Granulocytes: 0.05 10*3/uL (ref 0.00–0.07)
Basophils Absolute: 0 10*3/uL (ref 0.0–0.1)
Basophils Relative: 1 %
Eosinophils Absolute: 0.1 10*3/uL (ref 0.0–0.5)
Eosinophils Relative: 2 %
HCT: 32.3 % — ABNORMAL LOW (ref 39.0–52.0)
Hemoglobin: 10.3 g/dL — ABNORMAL LOW (ref 13.0–17.0)
Immature Granulocytes: 1 %
Lymphocytes Relative: 9 %
Lymphs Abs: 0.6 10*3/uL — ABNORMAL LOW (ref 0.7–4.0)
MCH: 32.5 pg (ref 26.0–34.0)
MCHC: 31.9 g/dL (ref 30.0–36.0)
MCV: 101.9 fL — ABNORMAL HIGH (ref 80.0–100.0)
Monocytes Absolute: 0.6 10*3/uL (ref 0.1–1.0)
Monocytes Relative: 9 %
Neutro Abs: 5.1 10*3/uL (ref 1.7–7.7)
Neutrophils Relative %: 78 %
Platelet Count: 80 10*3/uL — ABNORMAL LOW (ref 150–400)
RBC: 3.17 MIL/uL — ABNORMAL LOW (ref 4.22–5.81)
RDW: 16.6 % — ABNORMAL HIGH (ref 11.5–15.5)
WBC Count: 6.5 10*3/uL (ref 4.0–10.5)
nRBC: 0 % (ref 0.0–0.2)

## 2021-10-24 LAB — TSH: TSH: 3.793 u[IU]/mL (ref 0.320–4.118)

## 2021-10-24 MED ORDER — SODIUM CHLORIDE 0.9% FLUSH
10.0000 mL | Freq: Once | INTRAVENOUS | Status: AC
Start: 2021-10-24 — End: 2021-10-24
  Administered 2021-10-24: 10 mL

## 2021-10-24 NOTE — Telephone Encounter (Signed)
Spoke with Diane at [facility] for wet read on recent CT & MRI before pt's 0900 appt today.  Diane said she will put a rush on the wet read.

## 2021-10-24 NOTE — Progress Notes (Signed)
DISCONTINUE OFF PATHWAY REGIMEN - Other   [id number] (Leucovorin IV D1 + Fluorouracil IV D1/CIV D1,2 + Oxaliplatin IV D1) q14 Days:   A cycle is every 14 days:     Oxaliplatin      Leucovorin      Fluorouracil      Fluorouracil   **Always confirm dose/schedule in your pharmacy ordering system**  REASON: Disease Progression PRIOR TREATMENT: [id number] (Leucovorin IV D1 + Fluorouracil IV D1/CIV D1,2 + Oxaliplatin IV D1) q14 Days TREATMENT RESPONSE: Partial Response (PR)  START OFF PATHWAY REGIMEN - Other   [id number] (Leucovorin IV D1 + Fluorouracil IV D1/CIV D1,2 + Irinotecan IV D1) q14 Days:   A cycle is every 14 days:     Irinotecan      Leucovorin      Fluorouracil      Fluorouracil   **Always confirm dose/schedule in your pharmacy ordering system**  Patient Characteristics: Intent of Therapy: Non-Curative / Palliative Intent, Discussed with Patient 

## 2021-10-24 NOTE — Progress Notes (Addendum)
[place]   [phone] [phone]) [phone]   Clinic Follow up Note   Patient Care Team: [name], PA as PCP - General (Family Medicine) Lorretta Harp, MD as PCP - Cardiology (Cardiology) [name], MD as Consulting Physician (General Surgery) [name], [name], MD as Consulting Physician (Gastroenterology) [name], MD as Consulting Physician (Hematology) Lorretta Harp, MD as Consulting Physician (Cardiology)  Date of Service:  10/24/2021  CHIEF COMPLAINT: f/u of cholangiocarcinoma   CURRENT THERAPY:  Switching to FOLFIRI  ASSESSMENT & PLAN:  Brandon Sherman is a 75 y.o. male with   1. Intrahepatic cholangiocarcinoma, cT2N0Mx, unresectable, with indeterminate lung nodules  -Diagnosed in 07/2019. CT scans and MRI liver show a large 7.3cm mass in the right hepatic lobe which abuts portal vein, making it unresectable. -He began palliative IV Cisplatin and Gemcitabine 2 weeks on/1 week off on 09/29/19, tolerated well. Cisplatin held since cycle 9 (03/23/20) due to fluid status/A. Fib. He continued maintenance gemcitabine through 07/19/20. He proceeded with Y90 on 08/11/20 and 08/26/20. -He has IDH-1 mutation, which makes him eligible for an oral IDH inhibitor drug Ivosidenib.  -due to disease progression on 12/11/20 MRI, he restarted gemcitabine q2 weeks on 12/24/20. -progression again noted in liver on 03/16/21 MRI with concern for new liver lesion. Started second line FOLFOX q2 weeks plus durvalumab q4 weeks starting 04/05/21. Dose reduced chemo from cycle 8 due to cytopenias, stable and tolerating well -Restaging abd MRI and chest CT 10/21/21 showed concern for disease progression in liver and lung. I reviewed the results with them today. -I discussed switching treatment. He is eligible for Ivosidenib due to his IDH-1 mutation, but I discussed that this is not as effective as chemotherapy in general and I will reserve for future. My recommendation  is to change to FOLFIRI. We will cancel treatment today and try to get him rescheduled to start FOLFIRI soon. He is agreeable. --Chemotherapy consent: Side effects including but does not not limited to, fatigue, nausea, vomiting, diarrhea, hair loss, neuropathy, fluid retention, renal and kidney dysfunction, neutropenic fever, needed for blood transfusion, bleeding, were discussed with patient in great detail. He agrees to proceed. -the goal of chemo is palliative to prolong his life  -will reduce his chemo dose due to his cytopenias    2.  Lacrimation -He previously had blurry vision, began having watery eyes in 08/2021 affecting his vision and driving -Seen by ophthalmology, pending cataract surgery in November -Lacrimation did not improve off Imfinzi, immunotherapy was resumed -Not discussed today   3. Nausea, Low food intake and weight loss   -With pain from GERD and nausea he initially lost 30 pounds.  -followed by dietician -weight fluctuation has some component of fluid retention in lower extremities -stable overall    4. Afib, CHF -diagnosed after PAC placement on 02/23/20. -CHADS2 score 2, moderate risk for stroke. Started on metoprolol and eliquis on 03/01/20. Tolerating well without bleeding.  -He was started on lasix 20 mg every other day after consult with Dr. Gwenlyn Found; his dyspnea and leg edema have improved -He had cardioversion on 05/19/2020 -continue f/u with cardiology, Dr. Gwenlyn Found    5. DM, HTN, HLD, Gout -On Metformin, amlodipine, lisinopril, lasix, allopurinol -Continue to f/u with his PCP  -now on metoprolol and Eliquis for Afib -I previously reduced lasix to 1 tab BID rather than 2 tabs BID -monitoring BP   6. Nicotine Use -He never smoked but has been chewing Tobacco for the  past 60 years. He no longer drinks alcohol and tells me he has quit smoking (11/25/19).    7. GERD and gastritis, history of esophageal candidiasis -He had repeated EGD with Dr. Havery Moros in  07/2019. 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 May repeat EGD in future  -GERD resolved on PPI and carafate   PLAN: -CT and MRI reviewed, which showed caner progression -cancel treatment today -start FOLFIRI in the next week  -I will see him with cycle 2.   No problem-specific Assessment & Plan notes found for this encounter.   SUMMARY OF ONCOLOGIC HISTORY: Oncology History Overview Note  Cancer Staging Intrahepatic cholangiocarcinoma ([place]) Staging form: Intrahepatic Bile Duct, AJCC 8th Edition - Clinical stage from 08/19/2019: Stage II (cT2, cN0, cM0) - Signed by [name], MD on 08/19/2019    Intrahepatic cholangiocarcinoma ([place])  06/28/2019 Imaging   CT AP W Contrast 06/28/19  IMPRESSION: 1. Heterogeneous hypodensity posteriorly in the right hepatic lobe and potentially extending into the caudate lobe suspicious for a mass. There is felt to be truncation of branches of the portal vein in this vicinity and some narrowing of the hepatic vein, as well as triangular-shaped regions of abnormal hypoenhancement posteriorly in the right hepatic lobe likely representing downstream vascular effects. Cannot exclude malignancy such as cholangiocarcinoma or hepatocellular carcinoma, and follow up hepatic protocol MRI with and without contrast is recommended to further characterize. 2. 4 mm right middle lobe pulmonary nodule is likely benign but may merit surveillance. 3. Cholelithiasis. 4.  Aortic Atherosclerosis (ICD10-I70.0). 5. Prostatomegaly. 6. Mild impingement at L3-4 and L4-5.   07/31/2019 Imaging   MRI Liver 07/31/19 IMPRESSION: 1. 7.3 cm in long axis mass in the right hepatic lobe spanning into the caudate lobe, high suspicion for malignancy such as hepatocellular carcinoma or cholangiocarcinoma. Suspected effacement or occlusion of the right hepatic vein and posterior branches of  the right portal vein. Two smaller tumor nodules along the posterior periphery of the dominant mass. Tissue diagnosis is recommended. 2. No findings of pathologic adenopathy or distant metastatic spread. 3. 9 mm gallstone in the gallbladder. There is mild gallbladder wall thickening which may be from nondistention, correlate clinically in assessing for cholecystitis. 4.  Aortic Atherosclerosis (ICD10-I70.0). 5. Mild diffuse hepatic steatosis.   08/11/2019 Initial Biopsy   DIAGNOSIS: 08/11/19  A. LIVER, RIGHT, BIOPSY:  - Adenocarcinoma.   08/18/2019 Imaging   CT Chest 08/18/19  IMPRESSION: 1. Multiple pulmonary nodules largest at approximately 7 mm in the right lower lobe, nonspecific but concerning given findings in the liver. 2. No signs of definitive metastatic disease, also with mildly enlarged upper abdominal lymph nodes as discussed.   Aortic Atherosclerosis (ICD10-I70.0).   08/19/2019 Initial Diagnosis   Intrahepatic cholangiocarcinoma ([place])   08/19/2019 Cancer Staging   Staging form: Intrahepatic Bile Duct, AJCC 8th Edition - Clinical stage from 08/19/2019: Stage II (cT2, cN0, cM0) - Signed by [name], MD on 08/19/2019    09/29/2019 - 07/19/2020 Chemotherapy   Cisplatin and Gemcitabine 2 weeks on/1 week off starting 09/29/19. Cisplatin held from cycle 9 (03/23/20) due to fluid status/Afib. He is now on maintenance Gemcitabine. Held after 07/19/20 to proceed with liver target therapy.     10/09/2019 Imaging   CT AP IMPRESSION: 1. The dominant right hepatic lobe mass is minimally reduced in size compared to prior exams, currently measuring 6.2 by 5.3 cm, previously 6.2 by 5.6 cm. However, there is a  new small hypodense lesion centrally in the right hepatic lobe which is suspicious for a new small focus of tumor. Accordingly this is an overall mixed appearance. 2. Continued hypoenhancement in the liver downstream of the tumor likely attributable to narrowing or occlusion of the  right hepatic vein by the tumor. By virtue of its location the tumor wraps around the intrahepatic portion of the IVC. 3. 4 mm right middle lobe pulmonary nodule, stable compared to earliest available comparison of 07/18/2019. Surveillance of the patient's pulmonary nodules is recommended. 4. Other imaging findings of potential clinical significance: Coronary atherosclerosis. Cholelithiasis. Prominent stool throughout the colon favors constipation. Moderate prostatomegaly with heterogeneous enhancement of the prostate gland. Lumbar spondylosis and degenerative disc disease causing mild bilateral foraminal impingement at L3-4 and L4-5.   Aortic Atherosclerosis (ICD10-I70.0).   12/24/2019 Imaging   CT CAP W Contrast  IMPRESSION: 1. Right hepatic lobe mass and adjacent right hepatic lobe nodules appear grossly stable. No evidence of distant metastatic disease. 2. Continued stability of small pulmonary nodules. Recommend attention on follow-up. 3. Cholelithiasis. 4. Enlarged prostate. 5. Aortic atherosclerosis (ICD10-I70.0). Coronary artery calcification.   02/23/2020 Procedure   He had PAC placed on 02/23/20.    04/08/2020 Imaging   CT CAP  IMPRESSION: 1. Stable or minimally decreased size of a very ill-defined, hypodense and somewhat retractile appearing mass of the central right lobe of the liver abutting the inferior vena cava and right portal vein, measuring approximately 5.7 x 5.0 cm, previously 6.2 x 5.2 cm when measured similarly. Findings are consistent with stable or minimally improved cholangiocarcinoma. 2. Small hypodense nodules of the right lobe identified on prior examination are poorly appreciated on this single phase contrast examination although not grossly changed. Attention on follow-up. 3. There are new, moderate bilateral pleural effusions and associated atelectasis or consolidation as well as a new small pericardial effusion, nonspecific although generally concerning  and suspicious for malignant effusions. There is no directly visualized pleural nodularity. 4. Multiple small pulmonary nodules are stable.  No new nodules. 5. Coronary artery disease. Aortic Atherosclerosis (ICD10-I70.0).   07/06/2020 Imaging   MRI ABD IMPRESSION: 1. Interval decrease in size of the right hepatic lobe lesion in the medial aspect of the segment 7. Findings would suggest a good response to treatment with some contraction of the tumor. 2. No new hepatic lesions. No abdominal adenopathy or metastatic disease. 3. Stable mild intrahepatic biliary dilatation in the right hepatic lobe distal to the lesion. 4. Somewhat tortuous and almost beaded appearance of the hepatic and portal vein radicles. Findings could be radiation related. 5. Cholelithiasis.  CT chest wo contrast IMPRESSION: 1. Persistent/stable moderate-sized bilateral pleural effusions with overlying atelectasis. 2. Stable small right pulmonary nodules. No new or progressive findings. Recommend continued surveillance. 3. No mediastinal or hilar mass or adenopathy. 4. Stable advanced three-vessel coronary artery calcifications. 5. Cholelithiasis. Aortic Atherosclerosis (ICD10-I70.0)   08/11/2020 Procedure   Y90 on 08/11/20 and 08/26/20 with Dr Laurence Ferrari    11/30/2020 Imaging   MRI Abdomen  IMPRESSION: 1. Today's study demonstrates progression of disease with enlarging central lesion in the right lobe of the liver involving portions of segments 7 and 8, now with evidence of some tumor thrombus extension into the intrahepatic portion of the inferior vena cava. This is associated with increasing intrahepatic biliary ductal dilatation in segment 7 of the liver. 2. In addition, there is some subtle hyperenhancement of the distal common bile duct at and immediately proximally to the level of the ampulla.  There is also some subtle delayed enhancement around this region in the pancreatic head. This is of uncertain  etiology and significance, but may be inflammatory and currently is not associated with proximal common bile duct dilatation. However, close attention on follow-up imaging is recommended. 3. Cholelithiasis without evidence of acute cholecystitis at this time. 4. Aortic atherosclerosis.     12/24/2020 - 03/21/2021 Chemotherapy   Restart Gemcitabine 2 weeks on/1 week off given disease progression beginning 12/24/20. Discontinued after 03/21/21 due to disease progression in liver.     03/16/2021 Imaging   MRI abdomen  IMPRESSION: No significant change in size of irregular hypovascular mass in the medial right hepatic lobe.   New 1.1 cm hypovascular lesion with peripheral rim enhancement in liver dome, suspicious for metastatic disease.   New small right pleural effusion and mild ascites.   Cholelithiasis, without evidence of cholecystitis or biliary dilatation.   04/05/2021 -  Chemotherapy   Second-line FOLFOX q2weeks starting 04/05/21    04/05/2021 -  Chemotherapy   Immunotherapy with Durvalumab (Imfinzi) q4weeks starting 04/05/21. (in addition to second line FOLFOX)   06/23/2021 Imaging   MRI Abdomen IMPRESSION: 1. No significant change in subcapsular mass of the posterior right lobe of the liver. 2. No significant change in an additional small hyperenhancing lesion of the liver dome, which remains suspicious for a satellite lesion. 3. No new liver lesions. 4. 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 5. Cholelithiasis without evidence of acute cholecystitis. 6. Trace ascites.  CT Chest w/o contrast IMPRESSION: 1. Occasional small pulmonary nodules in the right lung are stable, for example a 6 mm nodule right lower lobe and a 3 mm nodule of the lateral segment right middle lobe. These are nonspecific and likely benign, incidental sequelae of infection or inflammation, metastatic disease not favored. Attention on follow-up. 2. Previously seen bilateral pleural  effusions are resolved. 3. Hypodense lesion of the posterior right lobe of the liver, keeping with known cholangiocarcinoma and better characterized by same day MR. 4. Coronary artery disease.   10/21/2021 Imaging   EXAM: MRI ABDOMEN WITHOUT AND WITH CONTRAST  IMPRESSION: 1. Mild progression of dominant right hepatic lobe mass with involvement of the right portal, hepatic veins and minimal extension into the IVC. 2. Hepatic dome satellite lesion, new or more conspicuous today. 3. Cholelithiasis 4. Decrease in trace perihepatic and perisplenic ascites. 5.  Aortic Atherosclerosis (ICD10-I70.0).   10/21/2021 Imaging   EXAM: CT CHEST WITHOUT CONTRAST  IMPRESSION: 1. Since 06/23/2021, enlargement of a right lower lobe and possible enlargement of a right middle lobe pulmonary nodule. Findings are suspicious for metastatic disease. 2. No thoracic adenopathy. 3. Coronary artery atherosclerosis. Aortic Atherosclerosis (ICD10-I70.0).      INTERVAL HISTORY:  Brandon Sherman is here for a follow up of cholangiocarcinoma . He was last seen by NP Lacie on 10/10/21. He presents to the clinic accompanied by his wife. He reports he is feeling well overall and is eating well. He notes his taste has changed, and food doesn't taste very good, but he continued to eat well despite this.  He reports continued numbness in his fingertips and toes. He notes the cold sensitivity part lasts only 3 days after infusion.   All other systems were reviewed with the patient and are negative.  MEDICAL HISTORY:  Past Medical History:  Diagnosis Date   Arthritis    Diabetes ([place])    GERD (gastroesophageal reflux disease)    Hyperlipidemia    Hypertension  Intrahepatic cholangiocarcinoma ([place])     SURGICAL HISTORY: Past Surgical History:  Procedure Laterality Date   APPENDECTOMY  1980   CARDIOVERSION N/A 04/27/2020   Procedure: CARDIOVERSION;  Surgeon: Dorothy Spark, MD;  Location: [facility];   Service: Cardiovascular;  Laterality: N/A;   CARDIOVERSION N/A 05/19/2020   Procedure: CARDIOVERSION;  Surgeon: Elouise Munroe, MD;  Location: [facility];  Service: Cardiovascular;  Laterality: N/A;   COLONOSCOPY     IR 3D INDEPENDENT [facility]KST  08/11/2020   IR 3D INDEPENDENT [facility]KST  08/11/2020   IR ANGIOGRAM SELECTIVE EACH ADDITIONAL VESSEL  08/11/2020   IR ANGIOGRAM SELECTIVE EACH ADDITIONAL VESSEL  08/11/2020   IR ANGIOGRAM SELECTIVE EACH ADDITIONAL VESSEL  08/11/2020   IR ANGIOGRAM SELECTIVE EACH ADDITIONAL VESSEL  08/11/2020   IR ANGIOGRAM SELECTIVE EACH ADDITIONAL VESSEL  08/11/2020   IR ANGIOGRAM SELECTIVE EACH ADDITIONAL VESSEL  08/11/2020   IR ANGIOGRAM SELECTIVE EACH ADDITIONAL VESSEL  08/26/2020   IR ANGIOGRAM SELECTIVE EACH ADDITIONAL VESSEL  08/26/2020   IR ANGIOGRAM VISCERAL SELECTIVE  08/11/2020   IR ANGIOGRAM VISCERAL SELECTIVE  08/26/2020   IR EMBO ARTERIAL NOT HEMORR HEMANG INC GUIDE ROADMAPPING  08/11/2020   IR EMBO TUMOR ORGAN ISCHEMIA INFARCT INC GUIDE ROADMAPPING  08/26/2020   IR IMAGING GUIDED PORT INSERTION  02/23/2020   IR RADIOLOGIST EVAL & MGMT  07/14/2020   IR RADIOLOGIST EVAL & MGMT  09/30/2020   IR RADIOLOGIST EVAL & MGMT  12/01/2020   IR US GUIDE VASC ACCESS LEFT  08/26/2020   IR US GUIDE VASC ACCESS RIGHT  08/11/2020    I have reviewed the social history and family history with the patient and they are unchanged from previous note.  ALLERGIES:  has No Known Allergies.  MEDICATIONS:  Current Outpatient Medications  Medication Sig Dispense Refill   allopurinol (ZYLOPRIM) 100 MG tablet Take 100 mg by mouth daily.     colchicine 0.6 MG tablet Take 1 tablet (0.6 mg total) by mouth daily as needed (gout flare). Take daily for 3 days, then as needed for gout flare 30 tablet 0   diltiazem (CARDIZEM CD) 240 MG 24 hr capsule TAKE 1 CAPSULE BY MOUTH EVERY DAY, STOP AMLODIPINE 90 capsule 3   ELIQUIS 5 MG TABS tablet TAKE 1 TABLET TWICE DAILY 180 tablet 1   fenofibrate (TRICOR) 145  MG tablet Take 145 mg by mouth daily.     finasteride (PROSCAR) 5 MG tablet Take 5 mg by mouth daily.     furosemide (LASIX) 40 MG tablet TAKE 2 TABLETS (80 MG TOTAL) BY MOUTH 2 (TWO) TIMES DAILY. 360 tablet 3   gabapentin (NEURONTIN) 100 MG capsule Take 100 mg by mouth at bedtime.     hydrALAZINE (APRESOLINE) 50 MG tablet Take 1 tablet (50 mg total) by mouth 2 (two) times daily. 180 tablet 2   KLOR-CON M20 20 MEQ tablet TAKE 1 TABLET BY MOUTH TWICE A DAY 180 tablet 3   lidocaine-prilocaine (EMLA) cream Apply 1 application topically as needed. 30 g 3   magnesium oxide (MAG-OX) 400 (240 Mg) MG tablet TAKE 1 TABLET BY MOUTH EVERY DAY 30 tablet 1   metoprolol succinate (TOPROL XL) 25 MG 24 hr tablet Take 1 tablet (25 mg total) by mouth daily. 90 tablet 3   metoprolol tartrate (LOPRESSOR) 50 MG tablet      ondansetron (ZOFRAN) 8 MG tablet TAKE 1 TABLET BY MOUTH TWICE A DAY AS NEEDED START ON 3RD DAY AFTER CHEMOTHERAPY 30 tablet 1  pantoprazole (PROTONIX) 40 MG tablet TAKE 1 TABLET BY MOUTH TWICE A DAY 180 tablet 2   pravastatin (PRAVACHOL) 40 MG tablet Take 40 mg by mouth daily.     sucralfate (CARAFATE) 1 g tablet Take 1 tablet (1 g total) by mouth every 6 (six) hours as needed. Please schedule a yearly follow up for further refills: [phone] (Patient not taking: Reported on 10/10/2021) 60 tablet 0   tamsulosin (FLOMAX) 0.4 MG CAPS capsule Take 0.4 mg by mouth daily.     No current facility-administered medications for this visit.    PHYSICAL EXAMINATION: ECOG PERFORMANCE STATUS: 1 - Symptomatic but completely ambulatory  Vitals:   10/24/21 0925  BP: (!) 144/69  Pulse: 63  Resp: 18  Temp: 97.9 F (36.6 C)  SpO2: 99%   Wt Readings from Last 3 Encounters:  10/24/21 215 lb 12.8 oz (97.9 kg)  10/10/21 216 lb 12 oz (98.3 kg)  09/19/21 212 lb 3.2 oz (96.3 kg)     GENERAL:alert, no distress and comfortable SKIN: skin color normal, no rashes or significant lesions EYES: normal,  Conjunctiva are pink and non-injected, sclera clear  NEURO: alert & oriented x 3 with fluent speech  LABORATORY DATA:  I have reviewed the data as listed CBC Latest Ref Rng & Units 10/24/2021 10/10/2021 09/19/2021  WBC 4.0 - 10.5 K/uL 6.5 5.3 7.9  Hemoglobin 13.0 - 17.0 g/dL 10.3(L) 10.8(L) 10.5(L)  Hematocrit 39.0 - 52.0 % 32.3(L) 33.2(L) 32.7(L)  Platelets 150 - 400 K/uL 80(L) 101(L) 104(L)     CMP Latest Ref Rng & Units 10/24/2021 10/10/2021 09/19/2021  Glucose 70 - 99 mg/dL 163(H) 183(H) 137(H)  BUN 8 - 23 mg/dL _0 Creatinine 0.61 - 1.24 mg/dL 1.42(H) 1.55(H) 1.47(H)  Sodium 135 - 145 mmol/L 137 138 138  Potassium 3.5 - 5.1 mmol/L 4.1 4.2 4.3  Chloride 98 - 111 mmol/L 108 110 108  CO2 22 - 32 mmol/L 21(L) 20(L) 23  Calcium 8.9 - 10.3 mg/dL 9.1 9.1 9.4  Total Protein 6.5 - 8.1 g/dL 7.0 7.0 7.1  Total Bilirubin 0.3 - 1.2 mg/dL 0.5 0.4 0.5  Alkaline Phos 38 - 126 U/L 125 98 122  AST 15 - 41 U/L 28 42(H) 40  ALT 0 - 44 U/L 23 33 25      RADIOGRAPHIC STUDIES: I have personally reviewed the radiological images as listed and agreed with the findings in the report. No results found.    No orders of the defined types were placed in this encounter.  All questions were answered. The patient knows to call the clinic with any problems, questions or concerns. No barriers to learning was detected. The total time spent in the appointment was 40 minutes.     [name], MD 10/24/2021   I, Wilburn Mylar, am acting as scribe for [name], MD.   I have reviewed the above documentation for accuracy and completeness, and I agree with the above.

## 2021-10-24 NOTE — Progress Notes (Signed)
OFF PATHWAY REGIMEN - Other  No Change  Continue With Treatment as Ordered.  Original Decision Date/Time: 03/21/2021 20:40   [id number] (Leucovorin IV D1 + Fluorouracil IV D1/CIV D1,2 + Oxaliplatin IV D1) q14 Days:   A cycle is every 14 days:     Oxaliplatin      Leucovorin      Fluorouracil      Fluorouracil   **Always confirm dose/schedule in your pharmacy ordering system**  Patient Characteristics: Intent of Therapy: Non-Curative / Palliative Intent, Discussed with Patient

## 2021-10-25 ENCOUNTER — Other Ambulatory Visit: Payer: Self-pay

## 2021-10-25 ENCOUNTER — Telehealth: Payer: Self-pay | Admitting: Hematology

## 2021-10-25 LAB — T4: T4, Total: 12 ug/dL (ref 4.5–12.0)

## 2021-10-25 NOTE — Telephone Encounter (Signed)
Scheduled follow-up appointments per 12/5 los. Patient's wife is aware.

## 2021-10-31 MED FILL — Dexamethasone Sodium Phosphate Inj 100 MG/10ML: INTRAMUSCULAR | Qty: 1 | Status: AC

## 2021-10-31 NOTE — Progress Notes (Signed)
..  Patient Assist/Replace for the following has been terminated. Medication: Imfinzi (durvalumab) Reason for Termination: MD discontinued medication due to disease progression on 10/24/2021. Last DOS: 10/10/2021.  Marland KitchenJuan Quam, CPhT IV Drug Replacement Specialist [place] Phone: [phone]

## 2021-10-31 NOTE — Progress Notes (Signed)
[place]   [phone] Fax:(336) [phone]   Clinic Follow up Note   Patient Care Team: [name], PA as PCP - General (Family Medicine) Lorretta Harp, MD as PCP - Cardiology (Cardiology) [name], MD as Consulting Physician (General Surgery) [name], [name], MD as Consulting Physician (Gastroenterology) [name], MD as Consulting Physician (Hematology) Lorretta Harp, MD as Consulting Physician (Cardiology) 11/01/2021  CHIEF COMPLAINT: Follow up cholangiocarcinoma   SUMMARY OF ONCOLOGIC HISTORY: Oncology History Overview Note  Cancer Staging Intrahepatic cholangiocarcinoma ([place]) Staging form: Intrahepatic Bile Duct, AJCC 8th Edition - Clinical stage from 08/19/2019: Stage II (cT2, cN0, cM0) - Signed by [name], MD on 08/19/2019    Intrahepatic cholangiocarcinoma ([place])  06/28/2019 Imaging   CT AP W Contrast 06/28/19  IMPRESSION: 1. Heterogeneous hypodensity posteriorly in the right hepatic lobe and potentially extending into the caudate lobe suspicious for a mass. There is felt to be truncation of branches of the portal vein in this vicinity and some narrowing of the hepatic vein, as well as triangular-shaped regions of abnormal hypoenhancement posteriorly in the right hepatic lobe likely representing downstream vascular effects. Cannot exclude malignancy such as cholangiocarcinoma or hepatocellular carcinoma, and follow up hepatic protocol MRI with and without contrast is recommended to further characterize. 2. 4 mm right middle lobe pulmonary nodule is likely benign but may merit surveillance. 3. Cholelithiasis. 4.  Aortic Atherosclerosis (ICD10-I70.0). 5. Prostatomegaly. 6. Mild impingement at L3-4 and L4-5.   07/31/2019 Imaging   MRI Liver 07/31/19 IMPRESSION: 1. 7.3 cm in long axis mass in the right hepatic lobe spanning into the caudate lobe, high suspicion for malignancy such as hepatocellular carcinoma or  cholangiocarcinoma. Suspected effacement or occlusion of the right hepatic vein and posterior branches of the right portal vein. Two smaller tumor nodules along the posterior periphery of the dominant mass. Tissue diagnosis is recommended. 2. No findings of pathologic adenopathy or distant metastatic spread. 3. 9 mm gallstone in the gallbladder. There is mild gallbladder wall thickening which may be from nondistention, correlate clinically in assessing for cholecystitis. 4.  Aortic Atherosclerosis (ICD10-I70.0). 5. Mild diffuse hepatic steatosis.   08/11/2019 Initial Biopsy   DIAGNOSIS: 08/11/19  A. LIVER, RIGHT, BIOPSY:  - Adenocarcinoma.   08/18/2019 Imaging   CT Chest 08/18/19  IMPRESSION: 1. Multiple pulmonary nodules largest at approximately 7 mm in the right lower lobe, nonspecific but concerning given findings in the liver. 2. No signs of definitive metastatic disease, also with mildly enlarged upper abdominal lymph nodes as discussed.   Aortic Atherosclerosis (ICD10-I70.0).   08/19/2019 Initial Diagnosis   Intrahepatic cholangiocarcinoma ([place])   08/19/2019 Cancer Staging   Staging form: Intrahepatic Bile Duct, AJCC 8th Edition - Clinical stage from 08/19/2019: Stage II (cT2, cN0, cM0) - Signed by [name], MD on 08/19/2019    09/29/2019 - 07/19/2020 Chemotherapy   Cisplatin and Gemcitabine 2 weeks on/1 week off starting 09/29/19. Cisplatin held from cycle 9 (03/23/20) due to fluid status/Afib. He is now on maintenance Gemcitabine. Held after 07/19/20 to proceed with liver target therapy.     10/09/2019 Imaging   CT AP IMPRESSION: 1. The dominant right hepatic lobe mass is minimally reduced in size compared to prior exams, currently measuring 6.2 by 5.3 cm, previously 6.2 by 5.6 cm. However, there is a new small hypodense lesion centrally in the right hepatic lobe which is suspicious for a new small focus of tumor. Accordingly this is an overall mixed appearance. 2. Continued  hypoenhancement in the liver downstream of the tumor likely attributable to narrowing or occlusion of the right hepatic vein by the tumor. By virtue of its location the tumor wraps around the intrahepatic portion of the IVC. 3. 4 mm right middle lobe pulmonary nodule, stable compared to earliest available comparison of 07/18/2019. Surveillance of the patient's pulmonary nodules is recommended. 4. Other imaging findings of potential clinical significance: Coronary atherosclerosis. Cholelithiasis. Prominent stool throughout the colon favors constipation. Moderate prostatomegaly with heterogeneous enhancement of the prostate gland. Lumbar spondylosis and degenerative disc disease causing mild bilateral foraminal impingement at L3-4 and L4-5.   Aortic Atherosclerosis (ICD10-I70.0).   12/24/2019 Imaging   CT CAP W Contrast  IMPRESSION: 1. Right hepatic lobe mass and adjacent right hepatic lobe nodules appear grossly stable. No evidence of distant metastatic disease. 2. Continued stability of small pulmonary nodules. Recommend attention on follow-up. 3. Cholelithiasis. 4. Enlarged prostate. 5. Aortic atherosclerosis (ICD10-I70.0). Coronary artery calcification.   02/23/2020 Procedure   He had PAC placed on 02/23/20.    04/08/2020 Imaging   CT CAP  IMPRESSION: 1. Stable or minimally decreased size of a very ill-defined, hypodense and somewhat retractile appearing mass of the central right lobe of the liver abutting the inferior vena cava and right portal vein, measuring approximately 5.7 x 5.0 cm, previously 6.2 x 5.2 cm when measured similarly. Findings are consistent with stable or minimally improved cholangiocarcinoma. 2. Small hypodense nodules of the right lobe identified on prior examination are poorly appreciated on this single phase contrast examination although not grossly changed. Attention on follow-up. 3. There are new, moderate bilateral pleural effusions and associated atelectasis  or consolidation as well as a new small pericardial effusion, nonspecific although generally concerning and suspicious for malignant effusions. There is no directly visualized pleural nodularity. 4. Multiple small pulmonary nodules are stable.  No new nodules. 5. Coronary artery disease. Aortic Atherosclerosis (ICD10-I70.0).   07/06/2020 Imaging   MRI ABD IMPRESSION: 1. Interval decrease in size of the right hepatic lobe lesion in the medial aspect of the segment 7. Findings would suggest a good response to treatment with some contraction of the tumor. 2. No new hepatic lesions. No abdominal adenopathy or metastatic disease. 3. Stable mild intrahepatic biliary dilatation in the right hepatic lobe distal to the lesion. 4. Somewhat tortuous and almost beaded appearance of the hepatic and portal vein radicles. Findings could be radiation related. 5. Cholelithiasis.  CT chest wo contrast IMPRESSION: 1. Persistent/stable moderate-sized bilateral pleural effusions with overlying atelectasis. 2. Stable small right pulmonary nodules. No new or progressive findings. Recommend continued surveillance. 3. No mediastinal or hilar mass or adenopathy. 4. Stable advanced three-vessel coronary artery calcifications. 5. Cholelithiasis. Aortic Atherosclerosis (ICD10-I70.0)   08/11/2020 Procedure   Y90 on 08/11/20 and 08/26/20 with Dr Laurence Ferrari    11/30/2020 Imaging   MRI Abdomen  IMPRESSION: 1. Today's study demonstrates progression of disease with enlarging central lesion in the right lobe of the liver involving portions of segments 7 and 8, now with evidence of some tumor thrombus extension into the intrahepatic portion of the inferior vena cava. This is associated with increasing intrahepatic biliary ductal dilatation in segment 7 of the liver. 2. In addition, there is some subtle hyperenhancement of the distal common bile duct at and immediately proximally to the level of the ampulla. There is  also some subtle delayed enhancement around this region in the pancreatic head. This is of uncertain etiology and significance, but may be inflammatory and currently is not  associated with proximal common bile duct dilatation. However, close attention on follow-up imaging is recommended. 3. Cholelithiasis without evidence of acute cholecystitis at this time. 4. Aortic atherosclerosis.     12/24/2020 - 03/21/2021 Chemotherapy   Restart Gemcitabine 2 weeks on/1 week off given disease progression beginning 12/24/20. Discontinued after 03/21/21 due to disease progression in liver.     03/16/2021 Imaging   MRI abdomen  IMPRESSION: No significant change in size of irregular hypovascular mass in the medial right hepatic lobe.   New 1.1 cm hypovascular lesion with peripheral rim enhancement in liver dome, suspicious for metastatic disease.   New small right pleural effusion and mild ascites.   Cholelithiasis, without evidence of cholecystitis or biliary dilatation.   04/05/2021 -  Chemotherapy   Second-line FOLFOX q2weeks starting 04/05/21    04/05/2021 -  Chemotherapy   Immunotherapy with Durvalumab (Imfinzi) q4weeks starting 04/05/21. (in addition to second line FOLFOX)   06/23/2021 Imaging   MRI Abdomen IMPRESSION: 1. No significant change in subcapsular mass of the posterior right lobe of the liver. 2. No significant change in an additional small hyperenhancing lesion of the liver dome, which remains suspicious for a satellite lesion. 3. No new liver lesions. 4. 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 5. Cholelithiasis without evidence of acute cholecystitis. 6. Trace ascites.  CT Chest w/o contrast IMPRESSION: 1. Occasional small pulmonary nodules in the right lung are stable, for example a 6 mm nodule right lower lobe and a 3 mm nodule of the lateral segment right middle lobe. These are nonspecific and likely benign, incidental sequelae of infection or inflammation,  metastatic disease not favored. Attention on follow-up. 2. Previously seen bilateral pleural effusions are resolved. 3. Hypodense lesion of the posterior right lobe of the liver, keeping with known cholangiocarcinoma and better characterized by same day MR. 4. Coronary artery disease.   10/21/2021 Imaging   EXAM: MRI ABDOMEN WITHOUT AND WITH CONTRAST  IMPRESSION: 1. Mild progression of dominant right hepatic lobe mass with involvement of the right portal, hepatic veins and minimal extension into the IVC. 2. Hepatic dome satellite lesion, new or more conspicuous today. 3. Cholelithiasis 4. Decrease in trace perihepatic and perisplenic ascites. 5.  Aortic Atherosclerosis (ICD10-I70.0).   10/21/2021 Imaging   EXAM: CT CHEST WITHOUT CONTRAST  IMPRESSION: 1. Since 06/23/2021, enlargement of a right lower lobe and possible enlargement of a right middle lobe pulmonary nodule. Findings are suspicious for metastatic disease. 2. No thoracic adenopathy. 3. Coronary artery atherosclerosis. Aortic Atherosclerosis (ICD10-I70.0).   11/01/2021 -  Chemotherapy   Patient is on Treatment Plan : PANCREAS FOLFIRI q14d       CURRENT THERAPY: PENDING third line FOLFIRI starting 11/01/21  INTERVAL HISTORY: Mr. Kimmey is seen in the infusion room prior to first cycle FOLFIRI. Last seen 10/24/21 to review CT chest and MRI abdomen which showed progression in the liver and lung. Last chemo was FOLFOX/imfinzi on 10/10/21 with GCSF.  He is doing well in general, denies changes in the interim.  He has numbness to the fingertips and toes, can function mostly well without dropping things except small bottle caps.  He is on gabapentin 200 mg nightly.  He remains active, works outside, with some lazy days.  P.o. intake is adequate.  Bowels moving normally. Cataracts were fixed recently, he can see much better now.   Denies abdominal pain/bloating, nausea, vomiting, rash, fever, chills, cough, chest pain, or  dyspnea.    MEDICAL HISTORY:  Past Medical History:  Diagnosis  Date   Arthritis    Diabetes ([place])    GERD (gastroesophageal reflux disease)    Hyperlipidemia    Hypertension    Intrahepatic cholangiocarcinoma ([place])     SURGICAL HISTORY: Past Surgical History:  Procedure Laterality Date   APPENDECTOMY  1980   CARDIOVERSION N/A 04/27/2020   Procedure: CARDIOVERSION;  Surgeon: Dorothy Spark, MD;  Location: [facility];  Service: Cardiovascular;  Laterality: N/A;   CARDIOVERSION N/A 05/19/2020   Procedure: CARDIOVERSION;  Surgeon: Elouise Munroe, MD;  Location: [place];  Service: Cardiovascular;  Laterality: N/A;   COLONOSCOPY     IR 3D INDEPENDENT WKST  08/11/2020   IR 3D INDEPENDENT WKST  08/11/2020   IR ANGIOGRAM SELECTIVE EACH ADDITIONAL VESSEL  08/11/2020   IR ANGIOGRAM SELECTIVE EACH ADDITIONAL VESSEL  08/11/2020   IR ANGIOGRAM SELECTIVE EACH ADDITIONAL VESSEL  08/11/2020   IR ANGIOGRAM SELECTIVE EACH ADDITIONAL VESSEL  08/11/2020   IR ANGIOGRAM SELECTIVE EACH ADDITIONAL VESSEL  08/11/2020   IR ANGIOGRAM SELECTIVE EACH ADDITIONAL VESSEL  08/11/2020   IR ANGIOGRAM SELECTIVE EACH ADDITIONAL VESSEL  08/26/2020   IR ANGIOGRAM SELECTIVE EACH ADDITIONAL VESSEL  08/26/2020   IR ANGIOGRAM VISCERAL SELECTIVE  08/11/2020   IR ANGIOGRAM VISCERAL SELECTIVE  08/26/2020   IR EMBO ARTERIAL NOT HEMORR HEMANG INC GUIDE ROADMAPPING  08/11/2020   IR EMBO TUMOR ORGAN ISCHEMIA INFARCT INC GUIDE ROADMAPPING  08/26/2020   IR IMAGING GUIDED PORT INSERTION  02/23/2020   IR RADIOLOGIST EVAL & MGMT  07/14/2020   IR RADIOLOGIST EVAL & MGMT  09/30/2020   IR RADIOLOGIST EVAL & MGMT  12/01/2020   IR US GUIDE VASC ACCESS LEFT  08/26/2020   IR US GUIDE VASC ACCESS RIGHT  08/11/2020    I have reviewed the social history and family history with the patient and they are unchanged from previous note.  ALLERGIES:  has No Known Allergies.  MEDICATIONS:  Current Outpatient Medications  Medication Sig Dispense  Refill   allopurinol (ZYLOPRIM) 100 MG tablet Take 100 mg by mouth daily.     colchicine 0.6 MG tablet Take 1 tablet (0.6 mg total) by mouth daily as needed (gout flare). Take daily for 3 days, then as needed for gout flare 30 tablet 0   diltiazem (CARDIZEM CD) 240 MG 24 hr capsule TAKE 1 CAPSULE BY MOUTH EVERY DAY, STOP AMLODIPINE 90 capsule 3   ELIQUIS 5 MG TABS tablet TAKE 1 TABLET TWICE DAILY 180 tablet 1   fenofibrate (TRICOR) 145 MG tablet Take 145 mg by mouth daily.     finasteride (PROSCAR) 5 MG tablet Take 5 mg by mouth daily.     furosemide (LASIX) 40 MG tablet TAKE 2 TABLETS (80 MG TOTAL) BY MOUTH 2 (TWO) TIMES DAILY. 360 tablet 3   gabapentin (NEURONTIN) 100 MG capsule Take 100 mg by mouth at bedtime.     hydrALAZINE (APRESOLINE) 50 MG tablet Take 1 tablet (50 mg total) by mouth 2 (two) times daily. 180 tablet 2   KLOR-CON M20 20 MEQ tablet TAKE 1 TABLET BY MOUTH TWICE A DAY 180 tablet 3   lidocaine-prilocaine (EMLA) cream Apply 1 application topically as needed. 30 g 3   magnesium oxide (MAG-OX) 400 (240 Mg) MG tablet TAKE 1 TABLET BY MOUTH EVERY DAY 30 tablet 1   metoprolol succinate (TOPROL XL) 25 MG 24 hr tablet Take 1 tablet (25 mg total) by mouth daily. 90 tablet 3   metoprolol tartrate (LOPRESSOR) 50 MG tablet  ondansetron (ZOFRAN) 8 MG tablet TAKE 1 TABLET BY MOUTH TWICE A DAY AS NEEDED START ON 3RD DAY AFTER CHEMOTHERAPY 30 tablet 1   pantoprazole (PROTONIX) 40 MG tablet TAKE 1 TABLET BY MOUTH TWICE A DAY 180 tablet 2   pravastatin (PRAVACHOL) 40 MG tablet Take 40 mg by mouth daily.     sucralfate (CARAFATE) 1 g tablet Take 1 tablet (1 g total) by mouth every 6 (six) hours as needed. Please schedule a yearly follow up for further refills: [phone] (Patient not taking: Reported on 10/10/2021) 60 tablet 0   tamsulosin (FLOMAX) 0.4 MG CAPS capsule Take 0.4 mg by mouth daily.     No current facility-administered medications for this visit.    PHYSICAL  EXAMINATION: ECOG PERFORMANCE STATUS: 1 - Symptomatic but completely ambulatory  Vitals:   11/01/21 1230  BP: (!) 147/72  Pulse: 69  Resp: 18  Temp: 97.9 F (36.6 C)  SpO2: 100%   Filed Weights   11/01/21 1230  Weight: 216 lb (98 kg)    GENERAL:alert, no distress and comfortable SKIN: No rash EYES: sclera clear LUNGS: clear with normal breathing effort HEART: regular rate & rhythm ABDOMEN:abdomen soft, non-tender and normal bowel sounds NEURO: alert & oriented x 3 with fluent speech, no focal motor/sensory deficits.  Intact peripheral vibratory sense over the fingertips per tuning fork exam PAC without erythema  LABORATORY DATA:  I have reviewed the data as listed CBC Latest Ref Rng & Units 11/01/2021 10/24/2021 10/10/2021  WBC 4.0 - 10.5 K/uL 5.0 6.5 5.3  Hemoglobin 13.0 - 17.0 g/dL 11.0(L) 10.3(L) 10.8(L)  Hematocrit 39.0 - 52.0 % 33.2(L) 32.3(L) 33.2(L)  Platelets 150 - 400 K/uL 112(L) 80(L) 101(L)     CMP Latest Ref Rng & Units 10/24/2021 10/10/2021 09/19/2021  Glucose 70 - 99 mg/dL 163(H) 183(H) 137(H)  BUN 8 - 23 mg/dL 18 18 16   Creatinine 0.61 - 1.24 mg/dL 1.42(H) 1.55(H) 1.47(H)  Sodium 135 - 145 mmol/L 137 138 138  Potassium 3.5 - 5.1 mmol/L 4.1 4.2 4.3  Chloride 98 - 111 mmol/L 108 110 108  CO2 22 - 32 mmol/L 21(L) 20(L) 23  Calcium 8.9 - 10.3 mg/dL 9.1 9.1 9.4  Total Protein 6.5 - 8.1 g/dL 7.0 7.0 7.1  Total Bilirubin 0.3 - 1.2 mg/dL 0.5 0.4 0.5  Alkaline Phos 38 - 126 U/L 125 98 122  AST 15 - 41 U/L 28 42(H) 40  ALT 0 - 44 U/L 23 33 25      RADIOGRAPHIC STUDIES: I have personally reviewed the radiological images as listed and agreed with the findings in the report. No results found.   ASSESSMENT & PLAN: MARQUINN MESCHKE is a 75 y.o. male with     1. Intrahepatic cholangiocarcinoma, cT2N0Mx, unresectable, with indeterminate lung nodules  -Diagnosed in 07/2019. CT scans and MRI liver show a large 7.3cm mass in the right hepatic lobe which abuts  portal vein. Both our local surgeon Dr. Barry Dienes and Dr Carlis Abbott at [facility] concluded that cancer is not resectable, and therefore not likely curable, due to the invasion to portal vein. -He began palliative systemic treatment standard first line chemo with IV Cisplatin and Gemcitabine 2 weeks on/1 week off. Started 09/29/19, tolerating well cisplatin was held with cycle 9 on 03/23/2020 due to fluid status/A. fib, now on maintenance gemcitabine alone -His FO results showed MSI stable disease, IDH1 mutations he may benefit from IDH inhibitor in the future -he went to single agent gemcitabine q2 weeks on  12/24/20 until disease progression in liver on 03/16/21 MRI with concern for new liver lesion  -Started second line FOLFOX q2 weeks plus durvalumab q4 weeks starting 04/05/21 -Restaging chest CT and MRI abdomen 06/23/2021 showed stable disease in the liver, small and stable lung nodules, no new lesions -Dose reduced chemo from cycle 8 due to cytopenias, stable and tolerating well -Restaging CT chest and MRI abdomen 10/21/2021 showed progression in the lungs and liver, treatment changed to third line FOLFIRI starting today, dose reduced for cytopenias   2.  Lacrimation -He previously had blurry vision, began having watery eyes in 08/2021 affecting his vision and driving -Seen by ophthalmology, pending cataract surgery in November -Lacrimation did not improve off Imfinzi, immunotherapy was resumed -Lacrimation resolved and vision improved after cataract surgery   3. Nausea, Low food intake and weight loss   -With pain from GERD and nausea he initially lost 30 pounds.  -followed by dietician -weight fluctuation has some component of fluid retention in lower extremities -Weight is stable overall.  Denies N/V   4. Afib, CHF -diagnosed after PAC placement on 02/23/20. -CHADS2 score 2, moderate risk for stroke. Started on metoprolol and eliquis on 03/01/20. Tolerating well without bleeding.  -He was started on lasix 20 mg  every other day after consult with Dr. Gwenlyn Found; his dyspnea and leg edema have improved -He had cardioversion on 05/19/2020 -continue f/u with cardiology, Dr. Gwenlyn Found  -Heart rhythm is normal on my exam today   5. DM, HTN, HLD, Gout -On Metformin, amlodipine, lisinopril, lasix, allopurinol -Continue to f/u with his PCP  -now on metoprolol and Eliquis for Afib -I previously reduced lasix to 1 tab BID rather than 2 tabs BID -monitoring BP   6. Nicotine Use -He never smoked but has been chewing Tobacco for the past 60 years. He no longer drinks alcohol and tells me he has quit smoking (11/25/19).    7. GERD and gastritis, history of esophageal candidiasis -He had repeated EGD with Dr. Havery Moros in 07/2019. 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 May repeat EGD in future  -GERD resolved on PPI and carafate  Disposition:  Mr. Radu appears stable. Labs adequate to proceed with cycle 1 FOLFIRI, with dose reduced irinotecan and 5FU pump for cytopenias. He will receive GCSF on day 3. We reviewed potential side effects, benefit, symptom management, and palliative goal of therapy. He agrees to proceed.   F/up with APP in 2 weeks with cycle 2, then in 4 weeks with Dr. Burr Medico for cycle 3. He knows to call sooner if needed.   All questions were answered. The patient knows to call the clinic with any problems, questions or concerns. No barriers to learning were detected.    Alla Feeling, NP 11/01/21

## 2021-11-01 ENCOUNTER — Inpatient Hospital Stay (HOSPITAL_BASED_OUTPATIENT_CLINIC_OR_DEPARTMENT_OTHER): Payer: Medicare HMO | Admitting: Nurse Practitioner

## 2021-11-01 ENCOUNTER — Inpatient Hospital Stay: Payer: Medicare HMO

## 2021-11-01 ENCOUNTER — Other Ambulatory Visit: Payer: Self-pay

## 2021-11-01 ENCOUNTER — Encounter: Payer: Self-pay | Admitting: Nurse Practitioner

## 2021-11-01 VITALS — BP 147/72 | HR 69 | Temp 97.9°F | Resp 18 | Ht 69.0 in | Wt 216.0 lb

## 2021-11-01 DIAGNOSIS — Z5111 Encounter for antineoplastic chemotherapy: Secondary | ICD-10-CM | POA: Diagnosis not present

## 2021-11-01 DIAGNOSIS — C221 Intrahepatic bile duct carcinoma: Secondary | ICD-10-CM

## 2021-11-01 DIAGNOSIS — F17220 Nicotine dependence, chewing tobacco, uncomplicated: Secondary | ICD-10-CM | POA: Diagnosis not present

## 2021-11-01 DIAGNOSIS — I4891 Unspecified atrial fibrillation: Secondary | ICD-10-CM | POA: Diagnosis not present

## 2021-11-01 DIAGNOSIS — Z79899 Other long term (current) drug therapy: Secondary | ICD-10-CM | POA: Diagnosis not present

## 2021-11-01 DIAGNOSIS — I110 Hypertensive heart disease with heart failure: Secondary | ICD-10-CM | POA: Diagnosis not present

## 2021-11-01 DIAGNOSIS — Z5189 Encounter for other specified aftercare: Secondary | ICD-10-CM | POA: Diagnosis not present

## 2021-11-01 DIAGNOSIS — E119 Type 2 diabetes mellitus without complications: Secondary | ICD-10-CM | POA: Diagnosis not present

## 2021-11-01 DIAGNOSIS — Z7189 Other specified counseling: Secondary | ICD-10-CM

## 2021-11-01 DIAGNOSIS — Z95828 Presence of other vascular implants and grafts: Secondary | ICD-10-CM

## 2021-11-01 DIAGNOSIS — I509 Heart failure, unspecified: Secondary | ICD-10-CM | POA: Diagnosis not present

## 2021-11-01 LAB — CBC WITH DIFFERENTIAL (CANCER CENTER ONLY)
Abs Immature Granulocytes: 0.03 10*3/uL (ref 0.00–0.07)
Basophils Absolute: 0.1 10*3/uL (ref 0.0–0.1)
Basophils Relative: 1 %
Eosinophils Absolute: 0.1 10*3/uL (ref 0.0–0.5)
Eosinophils Relative: 2 %
HCT: 33.2 % — ABNORMAL LOW (ref 39.0–52.0)
Hemoglobin: 11 g/dL — ABNORMAL LOW (ref 13.0–17.0)
Immature Granulocytes: 1 %
Lymphocytes Relative: 12 %
Lymphs Abs: 0.6 10*3/uL — ABNORMAL LOW (ref 0.7–4.0)
MCH: 33.3 pg (ref 26.0–34.0)
MCHC: 33.1 g/dL (ref 30.0–36.0)
MCV: 100.6 fL — ABNORMAL HIGH (ref 80.0–100.0)
Monocytes Absolute: 0.7 10*3/uL (ref 0.1–1.0)
Monocytes Relative: 15 %
Neutro Abs: 3.5 10*3/uL (ref 1.7–7.7)
Neutrophils Relative %: 69 %
Platelet Count: 112 10*3/uL — ABNORMAL LOW (ref 150–400)
RBC: 3.3 MIL/uL — ABNORMAL LOW (ref 4.22–5.81)
RDW: 16 % — ABNORMAL HIGH (ref 11.5–15.5)
WBC Count: 5 10*3/uL (ref 4.0–10.5)
nRBC: 0 % (ref 0.0–0.2)

## 2021-11-01 LAB — MAGNESIUM: Magnesium: 1.8 mg/dL (ref 1.7–2.4)

## 2021-11-01 LAB — CMP (CANCER CENTER ONLY)
ALT: 25 U/L (ref 0–44)
AST: 33 U/L (ref 15–41)
Albumin: 3.6 g/dL (ref 3.5–5.0)
Alkaline Phosphatase: 91 U/L (ref 38–126)
Anion gap: 10 (ref 5–15)
BUN: 19 mg/dL (ref 8–23)
CO2: 21 mmol/L — ABNORMAL LOW (ref 22–32)
Calcium: 9.2 mg/dL (ref 8.9–10.3)
Chloride: 108 mmol/L (ref 98–111)
Creatinine: 1.53 mg/dL — ABNORMAL HIGH (ref 0.61–1.24)
GFR, Estimated: 48 mL/min — ABNORMAL LOW (ref >60)
Glucose, Bld: 110 mg/dL — ABNORMAL HIGH (ref 70–99)
Potassium: 4.1 mmol/L (ref 3.5–5.1)
Sodium: 139 mmol/L (ref 135–145)
Total Bilirubin: 0.4 mg/dL (ref 0.3–1.2)
Total Protein: 7.4 g/dL (ref 6.5–8.1)

## 2021-11-01 MED ORDER — SODIUM CHLORIDE 0.9 % IV SOLN
400.0000 mg/m2 | Freq: Once | INTRAVENOUS | Status: AC
  Administered 2021-11-01: 872 mg via INTRAVENOUS
  Filled 2021-11-01: qty 25

## 2021-11-01 MED ORDER — SODIUM CHLORIDE 0.9 % IV SOLN: Freq: Once | INTRAVENOUS | Status: AC

## 2021-11-01 MED ORDER — FLUOROURACIL CHEMO INJECTION 2.5 GM/50ML
400.0000 mg/m2 | Freq: Once | INTRAVENOUS | Status: AC
  Administered 2021-11-01: 850 mg via INTRAVENOUS
  Filled 2021-11-01: qty 17

## 2021-11-01 MED ORDER — SODIUM CHLORIDE 0.9% FLUSH: 10.0000 mL | INTRAVENOUS | Status: DC | PRN

## 2021-11-01 MED ORDER — SODIUM CHLORIDE 0.9% FLUSH
10.0000 mL | Freq: Once | INTRAVENOUS | Status: AC
Start: 2021-11-01 — End: 2021-11-01
  Administered 2021-11-01: 10 mL

## 2021-11-01 MED ORDER — PALONOSETRON HCL INJECTION 0.25 MG/5ML
0.2500 mg | Freq: Once | INTRAVENOUS | Status: AC
  Administered 2021-11-01: 0.25 mg via INTRAVENOUS
  Filled 2021-11-01: qty 5

## 2021-11-01 MED ORDER — SODIUM CHLORIDE 0.9 % IV SOLN
10.0000 mg | Freq: Once | INTRAVENOUS | Status: AC
  Administered 2021-11-01: 10 mg via INTRAVENOUS
  Filled 2021-11-01: qty 10

## 2021-11-01 MED ORDER — SODIUM CHLORIDE 0.9 % IV SOLN
150.0000 mg/m2 | Freq: Once | INTRAVENOUS | Status: AC
  Administered 2021-11-01: 320 mg via INTRAVENOUS
  Filled 2021-11-01: qty 16

## 2021-11-01 MED ORDER — HEPARIN SOD (PORK) LOCK FLUSH 100 UNIT/ML IV SOLN: 500.0000 [IU] | Freq: Once | INTRAVENOUS | Status: DC | PRN

## 2021-11-01 MED ORDER — SODIUM CHLORIDE 0.9 % IV SOLN
2000.0000 mg/m2 | INTRAVENOUS | Status: DC
  Administered 2021-11-01: 4350 mg via INTRAVENOUS
  Filled 2021-11-01: qty 87

## 2021-11-01 MED ORDER — ATROPINE SULFATE 1 MG/ML IV SOLN
0.5000 mg | Freq: Once | INTRAVENOUS | Status: DC | PRN
  Filled 2021-11-01: qty 1

## 2021-11-01 NOTE — Progress Notes (Signed)
Per Dr. Burr Medico, "OK To Treat w/Scr+ 1.53 today"

## 2021-11-01 NOTE — Progress Notes (Signed)
Per Cira Rue, NP, "OK To Premedicate pt while waiting for CMP to result today since the previous CMP hemolyzed in lab today."  CMP redrawn while in infusion.

## 2021-11-01 NOTE — Patient Instructions (Signed)
[facility]  Discharge Instructions: Thank you for choosing [facility] to provide your oncology and hematology care.   If you have a lab appointment with the [facility], please go directly to the [facility] and check in at the registration area.   Wear comfortable clothing and clothing appropriate for easy access to any Portacath or PICC line.   We strive to give you quality time with your provider. You may need to reschedule your appointment if you arrive late (15 or more minutes).  Arriving late affects you and other patients whose appointments are after yours.  Also, if you miss three or more appointments without notifying the office, you may be dismissed from the clinic at the providers discretion.      For prescription refill requests, have your pharmacy contact our office and allow 72 hours for refills to be completed.    Today you received the following chemotherapy and/or immunotherapy agents: Irinotecan, leucovorin, fluorouracil       To help prevent nausea and vomiting after your treatment, we encourage you to take your nausea medication as directed.  BELOW ARE SYMPTOMS THAT SHOULD BE REPORTED IMMEDIATELY: *FEVER GREATER THAN 100.4 F (38 C) OR HIGHER *CHILLS OR SWEATING *NAUSEA AND VOMITING THAT IS NOT CONTROLLED WITH YOUR NAUSEA MEDICATION *UNUSUAL SHORTNESS OF BREATH *UNUSUAL BRUISING OR BLEEDING *URINARY PROBLEMS (pain or burning when urinating, or frequent urination) *BOWEL PROBLEMS (unusual diarrhea, constipation, pain near the anus) TENDERNESS IN MOUTH AND THROAT WITH OR WITHOUT PRESENCE OF ULCERS (sore throat, sores in mouth, or a toothache) UNUSUAL RASH, SWELLING OR PAIN  UNUSUAL VAGINAL DISCHARGE OR ITCHING   Items with * indicate a potential emergency and should be followed up as soon as possible or go to the Emergency Department if any problems should occur.  Please show the CHEMOTHERAPY ALERT CARD or  IMMUNOTHERAPY ALERT CARD at check-in to the Emergency Department and triage nurse.  Should you have questions after your visit or need to cancel or reschedule your appointment, please contact [facility]  Dept: [phone]  and follow the prompts.  Office hours are 8:00 a.m. to 4:30 p.m. Monday - Friday. Please note that voicemails left after 4:00 p.m. may not be returned until the following business day.  We are closed weekends and major holidays. You have access to a nurse at all times for urgent questions. Please call the main number to the clinic Dept: [phone] and follow the prompts.   For any non-urgent questions, you may also contact your provider using MyChart. We now offer e-Visits for anyone 8 and older to request care online for non-urgent symptoms. For details visit [email or web address].   Also download the MyChart app! Go to the app store, search "MyChart", open the app, select [facility], and log in with your MyChart username and password.  Due to Covid, a mask is required upon entering the hospital/clinic. If you do not have a mask, one will be given to you upon arrival. For doctor visits, patients may have 1 support person aged 60 or older with them. For treatment visits, patients cannot have anyone with them due to current Covid guidelines and our immunocompromised population.   Irinotecan injection What is this medication? IRINOTECAN (ir in oh TEE kan ) is a chemotherapy drug. It is used to treat colon and rectal cancer. This medicine may be used for other purposes; ask your health care provider or pharmacist if you have questions. COMMON  BRAND NAME(S): Camptosar What should I tell my care team before I take this medication? They need to know if you have any of these conditions: dehydration diarrhea infection (especially a virus infection such as chickenpox, cold sores, or herpes) liver disease low blood counts, like low white cell,  platelet, or red cell counts low levels of calcium, magnesium, or potassium in the blood recent or ongoing radiation therapy an unusual or allergic reaction to irinotecan, other medicines, foods, dyes, or preservatives pregnant or trying to get pregnant breast-feeding How should I use this medication? This drug is given as an infusion into a vein. It is administered in a hospital or clinic by a specially trained health care professional. Talk to your pediatrician regarding the use of this medicine in children. Special care may be needed. Overdosage: If you think you have taken too much of this medicine contact a poison control center or emergency room at once. NOTE: This medicine is only for you. Do not share this medicine with others. What if I miss a dose? It is important not to miss your dose. Call your doctor or health care professional if you are unable to keep an appointment. What may interact with this medication? Do not take this medicine with any of the following medications: cobicistat itraconazole This medicine may interact with the following medications: antiviral medicines for HIV or AIDS certain antibiotics like rifampin or rifabutin certain medicines for fungal infections like ketoconazole, posaconazole, and voriconazole certain medicines for seizures like carbamazepine, phenobarbital, phenotoin clarithromycin gemfibrozil nefazodone [facility]. John's Wort This list may not describe all possible interactions. Give your health care provider a list of all the medicines, herbs, non-prescription drugs, or dietary supplements you use. Also tell them if you smoke, drink alcohol, or use illegal drugs. Some items may interact with your medicine. What should I watch for while using this medication? Your condition will be monitored carefully while you are receiving this medicine. You will need important blood work done while you are taking this medicine. This drug may make you feel  generally unwell. This is not uncommon, as chemotherapy can affect healthy cells as well as cancer cells. Report any side effects. Continue your course of treatment even though you feel ill unless your doctor tells you to stop. In some cases, you may be given additional medicines to help with side effects. Follow all directions for their use. You may get drowsy or dizzy. Do not drive, use machinery, or do anything that needs mental alertness until you know how this medicine affects you. Do not stand or sit up quickly, especially if you are an older patient. This reduces the risk of dizzy or fainting spells. Call your health care professional for advice if you get a fever, chills, or sore throat, or other symptoms of a cold or flu. Do not treat yourself. This medicine decreases your body's ability to fight infections. Try to avoid being around people who are sick. Avoid taking products that contain aspirin, acetaminophen, ibuprofen, naproxen, or ketoprofen unless instructed by your doctor. These medicines may hide a fever. This medicine may increase your risk to bruise or bleed. Call your doctor or health care professional if you notice any unusual bleeding. Be careful brushing and flossing your teeth or using a toothpick because you may get an infection or bleed more easily. If you have any dental work done, tell your dentist you are receiving this medicine. Do not become pregnant while taking this medicine or for 6 months after  stopping it. Women should inform their health care professional if they wish to become pregnant or think they might be pregnant. Men should not father a child while taking this medicine and for 3 months after stopping it. There is potential for serious side effects to an unborn child. Talk to your health care professional for more information. Do not breast-feed an infant while taking this medicine or for 7 days after stopping it. This medicine has caused ovarian failure in some  women. This medicine may make it more difficult to get pregnant. Talk to your health care professional if you are concerned about your fertility. This medicine has caused decreased sperm counts in some men. This may make it more difficult to father a child. Talk to your health care professional if you are concerned about your fertility. What side effects may I notice from receiving this medication? Side effects that you should report to your doctor or health care professional as soon as possible: allergic reactions like skin rash, itching or hives, swelling of the face, lips, or tongue chest pain diarrhea flushing, runny nose, sweating during infusion low blood counts - this medicine may decrease the number of white blood cells, red blood cells and platelets. You may be at increased risk for infections and bleeding. nausea, vomiting pain, swelling, warmth in the leg signs of decreased platelets or bleeding - bruising, pinpoint red spots on the skin, black, tarry stools, blood in the urine signs of infection - fever or chills, cough, sore throat, pain or difficulty passing urine signs of decreased red blood cells - unusually weak or tired, fainting spells, lightheadedness Side effects that usually do not require medical attention (report to your doctor or health care professional if they continue or are bothersome): constipation hair loss headache loss of appetite mouth sores stomach pain This list may not describe all possible side effects. Call your doctor for medical advice about side effects. You may report side effects to FDA at 1-800-FDA-1088. Where should I keep my medication? This drug is given in a hospital or clinic and will not be stored at home. NOTE: This sheet is a summary. It may not cover all possible information. If you have questions about this medicine, talk to your doctor, pharmacist, or health care provider.  2022 [employer] Standard (2021-07-26 00:00:00) Leucovorin  injection What is this medication? LEUCOVORIN (loo koe VOR in) is used to prevent or treat the harmful effects of some medicines. This medicine is used to treat anemia caused by a low amount of folic acid in the body. It is also used with 5-fluorouracil (5-FU) to treat colon cancer. This medicine may be used for other purposes; ask your health care provider or pharmacist if you have questions. What should I tell my care team before I take this medication? They need to know if you have any of these conditions: anemia from low levels of vitamin B-12 in the blood an unusual or allergic reaction to leucovorin, folic acid, other medicines, foods, dyes, or preservatives pregnant or trying to get pregnant breast-feeding How should I use this medication? This medicine is for injection into a muscle or into a vein. It is given by a health care professional in a hospital or clinic setting. Talk to your pediatrician regarding the use of this medicine in children. Special care may be needed. Overdosage: If you think you have taken too much of this medicine contact a poison control center or emergency room at once. NOTE: This medicine is only for  you. Do not share this medicine with others. What if I miss a dose? This does not apply. What may interact with this medication? capecitabine fluorouracil phenobarbital phenytoin primidone trimethoprim-sulfamethoxazole This list may not describe all possible interactions. Give your health care provider a list of all the medicines, herbs, non-prescription drugs, or dietary supplements you use. Also tell them if you smoke, drink alcohol, or use illegal drugs. Some items may interact with your medicine. What should I watch for while using this medication? Your condition will be monitored carefully while you are receiving this medicine. This medicine may increase the side effects of 5-fluorouracil, 5-FU. Tell your doctor or health care professional if you have  diarrhea or mouth sores that do not get better or that get worse. What side effects may I notice from receiving this medication? Side effects that you should report to your doctor or health care professional as soon as possible: allergic reactions like skin rash, itching or hives, swelling of the face, lips, or tongue breathing problems fever, infection mouth sores unusual bleeding or bruising unusually weak or tired Side effects that usually do not require medical attention (report to your doctor or health care professional if they continue or are bothersome): constipation or diarrhea loss of appetite nausea, vomiting This list may not describe all possible side effects. Call your doctor for medical advice about side effects. You may report side effects to FDA at 1-800-FDA-1088. Where should I keep my medication? This drug is given in a hospital or clinic and will not be stored at home. NOTE: This sheet is a summary. It may not cover all possible information. If you have questions about this medicine, talk to your doctor, pharmacist, or health care provider.  2022 Elsevier/Gold Standard (2008-05-14 00:00:00) Fluorouracil, 5-FU injection What is this medication? FLUOROURACIL, 5-FU (flure oh YOOR a sil) is a chemotherapy drug. It slows the growth of cancer cells. This medicine is used to treat many types of cancer like breast cancer, colon or rectal cancer, pancreatic cancer, and stomach cancer. This medicine may be used for other purposes; ask your health care provider or pharmacist if you have questions. COMMON BRAND NAME(S): Adrucil What should I tell my care team before I take this medication? They need to know if you have any of these conditions: blood disorders dihydropyrimidine dehydrogenase (DPD) deficiency infection (especially a virus infection such as chickenpox, cold sores, or herpes) kidney disease liver disease malnourished, poor nutrition recent or ongoing radiation  therapy an unusual or allergic reaction to fluorouracil, other chemotherapy, other medicines, foods, dyes, or preservatives pregnant or trying to get pregnant breast-feeding How should I use this medication? This drug is given as an infusion or injection into a vein. It is administered in a hospital or clinic by a specially trained health care professional. Talk to your pediatrician regarding the use of this medicine in children. Special care may be needed. Overdosage: If you think you have taken too much of this medicine contact a poison control center or emergency room at once. NOTE: This medicine is only for you. Do not share this medicine with others. What if I miss a dose? It is important not to miss your dose. Call your doctor or health care professional if you are unable to keep an appointment. What may interact with this medication? Do not take this medicine with any of the following medications: live virus vaccines This medicine may also interact with the following medications: medicines that treat or prevent blood clots like  warfarin, enoxaparin, and dalteparin This list may not describe all possible interactions. Give your health care provider a list of all the medicines, herbs, non-prescription drugs, or dietary supplements you use. Also tell them if you smoke, drink alcohol, or use illegal drugs. Some items may interact with your medicine. What should I watch for while using this medication? Visit your doctor for checks on your progress. This drug may make you feel generally unwell. This is not uncommon, as chemotherapy can affect healthy cells as well as cancer cells. Report any side effects. Continue your course of treatment even though you feel ill unless your doctor tells you to stop. In some cases, you may be given additional medicines to help with side effects. Follow all directions for their use. Call your doctor or health care professional for advice if you get a fever,  chills or sore throat, or other symptoms of a cold or flu. Do not treat yourself. This drug decreases your body's ability to fight infections. Try to avoid being around people who are sick. This medicine may increase your risk to bruise or bleed. Call your doctor or health care professional if you notice any unusual bleeding. Be careful brushing and flossing your teeth or using a toothpick because you may get an infection or bleed more easily. If you have any dental work done, tell your dentist you are receiving this medicine. Avoid taking products that contain aspirin, acetaminophen, ibuprofen, naproxen, or ketoprofen unless instructed by your doctor. These medicines may hide a fever. Do not become pregnant while taking this medicine. Women should inform their doctor if they wish to become pregnant or think they might be pregnant. There is a potential for serious side effects to an unborn child. Talk to your health care professional or pharmacist for more information. Do not breast-feed an infant while taking this medicine. Men should inform their doctor if they wish to father a child. This medicine may lower sperm counts. Do not treat diarrhea with over the counter products. Contact your doctor if you have diarrhea that lasts more than 2 days or if it is severe and watery. This medicine can make you more sensitive to the sun. Keep out of the sun. If you cannot avoid being in the sun, wear protective clothing and use sunscreen. Do not use sun lamps or tanning beds/booths. What side effects may I notice from receiving this medication? Side effects that you should report to your doctor or health care professional as soon as possible: allergic reactions like skin rash, itching or hives, swelling of the face, lips, or tongue low blood counts - this medicine may decrease the number of white blood cells, red blood cells and platelets. You may be at increased risk for infections and bleeding. signs of  infection - fever or chills, cough, sore throat, pain or difficulty passing urine signs of decreased platelets or bleeding - bruising, pinpoint red spots on the skin, black, tarry stools, blood in the urine signs of decreased red blood cells - unusually weak or tired, fainting spells, lightheadedness breathing problems changes in vision chest pain mouth sores nausea and vomiting pain, swelling, redness at site where injected pain, tingling, numbness in the hands or feet redness, swelling, or sores on hands or feet stomach pain unusual bleeding Side effects that usually do not require medical attention (report to your doctor or health care professional if they continue or are bothersome): changes in finger or toe nails diarrhea dry or itchy skin hair loss headache  loss of appetite sensitivity of eyes to the light stomach upset unusually teary eyes This list may not describe all possible side effects. Call your doctor for medical advice about side effects. You may report side effects to FDA at 1-800-FDA-1088. Where should I keep my medication? This drug is given in a hospital or clinic and will not be stored at home. NOTE: This sheet is a summary. It may not cover all possible information. If you have questions about this medicine, talk to your doctor, pharmacist, or health care provider.  2022 [employer]/Gold Standard (2021-07-26 00:00:00)

## 2021-11-03 ENCOUNTER — Other Ambulatory Visit: Payer: Self-pay

## 2021-11-03 ENCOUNTER — Inpatient Hospital Stay: Payer: Medicare HMO

## 2021-11-03 VITALS — BP 141/70 | HR 67 | Temp 97.8°F | Resp 18

## 2021-11-03 DIAGNOSIS — I4891 Unspecified atrial fibrillation: Secondary | ICD-10-CM | POA: Diagnosis not present

## 2021-11-03 DIAGNOSIS — I110 Hypertensive heart disease with heart failure: Secondary | ICD-10-CM | POA: Diagnosis not present

## 2021-11-03 DIAGNOSIS — Z5111 Encounter for antineoplastic chemotherapy: Secondary | ICD-10-CM | POA: Diagnosis not present

## 2021-11-03 DIAGNOSIS — C221 Intrahepatic bile duct carcinoma: Secondary | ICD-10-CM

## 2021-11-03 DIAGNOSIS — Z79899 Other long term (current) drug therapy: Secondary | ICD-10-CM | POA: Diagnosis not present

## 2021-11-03 DIAGNOSIS — E119 Type 2 diabetes mellitus without complications: Secondary | ICD-10-CM | POA: Diagnosis not present

## 2021-11-03 DIAGNOSIS — I509 Heart failure, unspecified: Secondary | ICD-10-CM | POA: Diagnosis not present

## 2021-11-03 DIAGNOSIS — Z5189 Encounter for other specified aftercare: Secondary | ICD-10-CM | POA: Diagnosis not present

## 2021-11-03 DIAGNOSIS — F17220 Nicotine dependence, chewing tobacco, uncomplicated: Secondary | ICD-10-CM | POA: Diagnosis not present

## 2021-11-03 DIAGNOSIS — Z7189 Other specified counseling: Secondary | ICD-10-CM

## 2021-11-03 MED ORDER — SODIUM CHLORIDE 0.9% FLUSH
10.0000 mL | INTRAVENOUS | Status: DC | PRN
  Administered 2021-11-03: 10 mL

## 2021-11-03 MED ORDER — PEGFILGRASTIM-CBQV 6 MG/0.6ML ~~LOC~~ SOSY
6.0000 mg | PREFILLED_SYRINGE | Freq: Once | SUBCUTANEOUS | Status: AC
  Administered 2021-11-03: 6 mg via SUBCUTANEOUS
  Filled 2021-11-03: qty 0.6

## 2021-11-03 MED ORDER — HEPARIN SOD (PORK) LOCK FLUSH 100 UNIT/ML IV SOLN
500.0000 [IU] | Freq: Once | INTRAVENOUS | Status: AC | PRN
  Administered 2021-11-03: 500 [IU]

## 2021-11-08 ENCOUNTER — Ambulatory Visit: Payer: Medicare HMO

## 2021-11-08 ENCOUNTER — Ambulatory Visit: Payer: Medicare HMO | Admitting: Hematology

## 2021-11-08 ENCOUNTER — Other Ambulatory Visit: Payer: Medicare HMO

## 2021-11-09 DIAGNOSIS — I4891 Unspecified atrial fibrillation: Secondary | ICD-10-CM | POA: Diagnosis not present

## 2021-11-09 DIAGNOSIS — I10 Essential (primary) hypertension: Secondary | ICD-10-CM | POA: Diagnosis not present

## 2021-11-09 DIAGNOSIS — N182 Chronic kidney disease, stage 2 (mild): Secondary | ICD-10-CM | POA: Diagnosis not present

## 2021-11-09 DIAGNOSIS — I129 Hypertensive chronic kidney disease with stage 1 through stage 4 chronic kidney disease, or unspecified chronic kidney disease: Secondary | ICD-10-CM | POA: Diagnosis not present

## 2021-11-09 DIAGNOSIS — M109 Gout, unspecified: Secondary | ICD-10-CM | POA: Diagnosis not present

## 2021-11-09 DIAGNOSIS — Z139 Encounter for screening, unspecified: Secondary | ICD-10-CM | POA: Diagnosis not present

## 2021-11-09 DIAGNOSIS — E785 Hyperlipidemia, unspecified: Secondary | ICD-10-CM | POA: Diagnosis not present

## 2021-11-09 DIAGNOSIS — C221 Intrahepatic bile duct carcinoma: Secondary | ICD-10-CM | POA: Diagnosis not present

## 2021-11-09 DIAGNOSIS — I509 Heart failure, unspecified: Secondary | ICD-10-CM | POA: Diagnosis not present

## 2021-11-09 DIAGNOSIS — E1122 Type 2 diabetes mellitus with diabetic chronic kidney disease: Secondary | ICD-10-CM | POA: Diagnosis not present

## 2021-11-09 DIAGNOSIS — K219 Gastro-esophageal reflux disease without esophagitis: Secondary | ICD-10-CM | POA: Diagnosis not present

## 2021-11-16 ENCOUNTER — Inpatient Hospital Stay: Payer: Medicare HMO

## 2021-11-16 ENCOUNTER — Other Ambulatory Visit: Payer: Self-pay

## 2021-11-16 ENCOUNTER — Inpatient Hospital Stay (HOSPITAL_BASED_OUTPATIENT_CLINIC_OR_DEPARTMENT_OTHER): Payer: Medicare HMO | Admitting: Physician Assistant

## 2021-11-16 ENCOUNTER — Other Ambulatory Visit: Payer: Self-pay | Admitting: Hematology

## 2021-11-16 VITALS — BP 131/60 | HR 73 | Temp 97.9°F | Resp 20 | Ht 69.0 in | Wt 222.3 lb

## 2021-11-16 DIAGNOSIS — Z5111 Encounter for antineoplastic chemotherapy: Secondary | ICD-10-CM | POA: Diagnosis not present

## 2021-11-16 DIAGNOSIS — I4891 Unspecified atrial fibrillation: Secondary | ICD-10-CM | POA: Diagnosis not present

## 2021-11-16 DIAGNOSIS — Z7189 Other specified counseling: Secondary | ICD-10-CM

## 2021-11-16 DIAGNOSIS — F17220 Nicotine dependence, chewing tobacco, uncomplicated: Secondary | ICD-10-CM | POA: Diagnosis not present

## 2021-11-16 DIAGNOSIS — C221 Intrahepatic bile duct carcinoma: Secondary | ICD-10-CM | POA: Diagnosis not present

## 2021-11-16 DIAGNOSIS — E119 Type 2 diabetes mellitus without complications: Secondary | ICD-10-CM | POA: Diagnosis not present

## 2021-11-16 DIAGNOSIS — Z5189 Encounter for other specified aftercare: Secondary | ICD-10-CM | POA: Diagnosis not present

## 2021-11-16 DIAGNOSIS — Z95828 Presence of other vascular implants and grafts: Secondary | ICD-10-CM

## 2021-11-16 DIAGNOSIS — Z79899 Other long term (current) drug therapy: Secondary | ICD-10-CM | POA: Diagnosis not present

## 2021-11-16 DIAGNOSIS — I110 Hypertensive heart disease with heart failure: Secondary | ICD-10-CM | POA: Diagnosis not present

## 2021-11-16 DIAGNOSIS — I509 Heart failure, unspecified: Secondary | ICD-10-CM | POA: Diagnosis not present

## 2021-11-16 LAB — CBC WITH DIFFERENTIAL (CANCER CENTER ONLY)
Abs Immature Granulocytes: 0.04 10*3/uL (ref 0.00–0.07)
Basophils Absolute: 0 10*3/uL (ref 0.0–0.1)
Basophils Relative: 1 %
Eosinophils Absolute: 0.3 10*3/uL (ref 0.0–0.5)
Eosinophils Relative: 5 %
HCT: 30.6 % — ABNORMAL LOW (ref 39.0–52.0)
Hemoglobin: 9.8 g/dL — ABNORMAL LOW (ref 13.0–17.0)
Immature Granulocytes: 1 %
Lymphocytes Relative: 11 %
Lymphs Abs: 0.6 10*3/uL — ABNORMAL LOW (ref 0.7–4.0)
MCH: 32.5 pg (ref 26.0–34.0)
MCHC: 32 g/dL (ref 30.0–36.0)
MCV: 101.3 fL — ABNORMAL HIGH (ref 80.0–100.0)
Monocytes Absolute: 0.7 10*3/uL (ref 0.1–1.0)
Monocytes Relative: 13 %
Neutro Abs: 3.7 10*3/uL (ref 1.7–7.7)
Neutrophils Relative %: 69 %
Platelet Count: 84 10*3/uL — ABNORMAL LOW (ref 150–400)
RBC: 3.02 MIL/uL — ABNORMAL LOW (ref 4.22–5.81)
RDW: 15.9 % — ABNORMAL HIGH (ref 11.5–15.5)
WBC Count: 5.2 10*3/uL (ref 4.0–10.5)
nRBC: 0 % (ref 0.0–0.2)

## 2021-11-16 LAB — CMP (CANCER CENTER ONLY)
ALT: 18 U/L (ref 0–44)
AST: 21 U/L (ref 15–41)
Albumin: 3.6 g/dL (ref 3.5–5.0)
Alkaline Phosphatase: 87 U/L (ref 38–126)
Anion gap: 8 (ref 5–15)
BUN: 15 mg/dL (ref 8–23)
CO2: 23 mmol/L (ref 22–32)
Calcium: 9 mg/dL (ref 8.9–10.3)
Chloride: 107 mmol/L (ref 98–111)
Creatinine: 1.33 mg/dL — ABNORMAL HIGH (ref 0.61–1.24)
GFR, Estimated: 56 mL/min — ABNORMAL LOW (ref >60)
Glucose, Bld: 117 mg/dL — ABNORMAL HIGH (ref 70–99)
Potassium: 3.8 mmol/L (ref 3.5–5.1)
Sodium: 138 mmol/L (ref 135–145)
Total Bilirubin: 0.5 mg/dL (ref 0.3–1.2)
Total Protein: 6.6 g/dL (ref 6.5–8.1)

## 2021-11-16 MED ORDER — SODIUM CHLORIDE 0.9 % IV SOLN
1800.0000 mg/m2 | INTRAVENOUS | Status: DC
  Administered 2021-11-16: 16:00:00 3900 mg via INTRAVENOUS
  Filled 2021-11-16: qty 78

## 2021-11-16 MED ORDER — SODIUM CHLORIDE 0.9% FLUSH
10.0000 mL | Freq: Once | INTRAVENOUS | Status: AC
Start: 2021-11-16 — End: 2021-11-16
  Administered 2021-11-16: 12:00:00 10 mL

## 2021-11-16 MED ORDER — SODIUM CHLORIDE 0.9 % IV SOLN
10.0000 mg | Freq: Once | INTRAVENOUS | Status: AC
  Administered 2021-11-16: 14:00:00 10 mg via INTRAVENOUS
  Filled 2021-11-16: qty 10

## 2021-11-16 MED ORDER — SODIUM CHLORIDE 0.9 % IV SOLN
110.0000 mg/m2 | Freq: Once | INTRAVENOUS | Status: AC
  Administered 2021-11-16: 15:00:00 240 mg via INTRAVENOUS
  Filled 2021-11-16: qty 12

## 2021-11-16 MED ORDER — SODIUM CHLORIDE 0.9 % IV SOLN
400.0000 mg/m2 | Freq: Once | INTRAVENOUS | Status: AC
  Administered 2021-11-16: 15:00:00 872 mg via INTRAVENOUS
  Filled 2021-11-16: qty 25

## 2021-11-16 MED ORDER — PALONOSETRON HCL INJECTION 0.25 MG/5ML
0.2500 mg | Freq: Once | INTRAVENOUS | Status: AC
  Administered 2021-11-16: 14:00:00 0.25 mg via INTRAVENOUS
  Filled 2021-11-16: qty 5

## 2021-11-16 MED ORDER — SODIUM CHLORIDE 0.9 % IV SOLN
Freq: Once | INTRAVENOUS | Status: AC
Start: 2021-11-16 — End: 2021-11-16

## 2021-11-16 NOTE — Patient Instructions (Signed)
[facility]  Discharge Instructions: Thank you for choosing [facility] to provide your oncology and hematology care.   If you have a lab appointment with the [facility], please go directly to the [facility] and check in at the registration area.   Wear comfortable clothing and clothing appropriate for easy access to any Portacath or PICC line.   We strive to give you quality time with your provider. You may need to reschedule your appointment if you arrive late (15 or more minutes).  Arriving late affects you and other patients whose appointments are after yours.  Also, if you miss three or more appointments without notifying the office, you may be dismissed from the clinic at the providers discretion.      For prescription refill requests, have your pharmacy contact our office and allow 72 hours for refills to be completed.    Today you received the following chemotherapy and/or immunotherapy agents: Irinotecan, leucovorin, fluorouracil       To help prevent nausea and vomiting after your treatment, we encourage you to take your nausea medication as directed.  BELOW ARE SYMPTOMS THAT SHOULD BE REPORTED IMMEDIATELY: *FEVER GREATER THAN 100.4 F (38 C) OR HIGHER *CHILLS OR SWEATING *NAUSEA AND VOMITING THAT IS NOT CONTROLLED WITH YOUR NAUSEA MEDICATION *UNUSUAL SHORTNESS OF BREATH *UNUSUAL BRUISING OR BLEEDING *URINARY PROBLEMS (pain or burning when urinating, or frequent urination) *BOWEL PROBLEMS (unusual diarrhea, constipation, pain near the anus) TENDERNESS IN MOUTH AND THROAT WITH OR WITHOUT PRESENCE OF ULCERS (sore throat, sores in mouth, or a toothache) UNUSUAL RASH, SWELLING OR PAIN  UNUSUAL VAGINAL DISCHARGE OR ITCHING   Items with * indicate a potential emergency and should be followed up as soon as possible or go to the Emergency Department if any problems should occur.  Please show the CHEMOTHERAPY ALERT CARD or  IMMUNOTHERAPY ALERT CARD at check-in to the Emergency Department and triage nurse.  Should you have questions after your visit or need to cancel or reschedule your appointment, please contact [facility]  Dept: [phone]  and follow the prompts.  Office hours are 8:00 a.m. to 4:30 p.m. Monday - Friday. Please note that voicemails left after 4:00 p.m. may not be returned until the following business day.  We are closed weekends and major holidays. You have access to a nurse at all times for urgent questions. Please call the main number to the clinic Dept: [phone] and follow the prompts.   For any non-urgent questions, you may also contact your provider using MyChart. We now offer e-Visits for anyone 1 and older to request care online for non-urgent symptoms. For details visit [email or web address].   Also download the MyChart app! Go to the app store, search "MyChart", open the app, select [facility], and log in with your MyChart username and password.  Due to Covid, a mask is required upon entering the hospital/clinic. If you do not have a mask, one will be given to you upon arrival. For doctor visits, patients may have 1 support person aged 32 or older with them. For treatment visits, patients cannot have anyone with them due to current Covid guidelines and our immunocompromised population.   Irinotecan injection What is this medication? IRINOTECAN (ir in oh TEE kan ) is a chemotherapy drug. It is used to treat colon and rectal cancer. This medicine may be used for other purposes; ask your health care provider or pharmacist if you have questions. COMMON  BRAND NAME(S): Camptosar What should I tell my care team before I take this medication? They need to know if you have any of these conditions: dehydration diarrhea infection (especially a virus infection such as chickenpox, cold sores, or herpes) liver disease low blood counts, like low white cell,  platelet, or red cell counts low levels of calcium, magnesium, or potassium in the blood recent or ongoing radiation therapy an unusual or allergic reaction to irinotecan, other medicines, foods, dyes, or preservatives pregnant or trying to get pregnant breast-feeding How should I use this medication? This drug is given as an infusion into a vein. It is administered in a hospital or clinic by a specially trained health care professional. Talk to your pediatrician regarding the use of this medicine in children. Special care may be needed. Overdosage: If you think you have taken too much of this medicine contact a poison control center or emergency room at once. NOTE: This medicine is only for you. Do not share this medicine with others. What if I miss a dose? It is important not to miss your dose. Call your doctor or health care professional if you are unable to keep an appointment. What may interact with this medication? Do not take this medicine with any of the following medications: cobicistat itraconazole This medicine may interact with the following medications: antiviral medicines for HIV or AIDS certain antibiotics like rifampin or rifabutin certain medicines for fungal infections like ketoconazole, posaconazole, and voriconazole certain medicines for seizures like carbamazepine, phenobarbital, phenotoin clarithromycin gemfibrozil nefazodone [facility]. John's Wort This list may not describe all possible interactions. Give your health care provider a list of all the medicines, herbs, non-prescription drugs, or dietary supplements you use. Also tell them if you smoke, drink alcohol, or use illegal drugs. Some items may interact with your medicine. What should I watch for while using this medication? Your condition will be monitored carefully while you are receiving this medicine. You will need important blood work done while you are taking this medicine. This drug may make you feel  generally unwell. This is not uncommon, as chemotherapy can affect healthy cells as well as cancer cells. Report any side effects. Continue your course of treatment even though you feel ill unless your doctor tells you to stop. In some cases, you may be given additional medicines to help with side effects. Follow all directions for their use. You may get drowsy or dizzy. Do not drive, use machinery, or do anything that needs mental alertness until you know how this medicine affects you. Do not stand or sit up quickly, especially if you are an older patient. This reduces the risk of dizzy or fainting spells. Call your health care professional for advice if you get a fever, chills, or sore throat, or other symptoms of a cold or flu. Do not treat yourself. This medicine decreases your body's ability to fight infections. Try to avoid being around people who are sick. Avoid taking products that contain aspirin, acetaminophen, ibuprofen, naproxen, or ketoprofen unless instructed by your doctor. These medicines may hide a fever. This medicine may increase your risk to bruise or bleed. Call your doctor or health care professional if you notice any unusual bleeding. Be careful brushing and flossing your teeth or using a toothpick because you may get an infection or bleed more easily. If you have any dental work done, tell your dentist you are receiving this medicine. Do not become pregnant while taking this medicine or for 6 months after  stopping it. Women should inform their health care professional if they wish to become pregnant or think they might be pregnant. Men should not father a child while taking this medicine and for 3 months after stopping it. There is potential for serious side effects to an unborn child. Talk to your health care professional for more information. Do not breast-feed an infant while taking this medicine or for 7 days after stopping it. This medicine has caused ovarian failure in some  women. This medicine may make it more difficult to get pregnant. Talk to your health care professional if you are concerned about your fertility. This medicine has caused decreased sperm counts in some men. This may make it more difficult to father a child. Talk to your health care professional if you are concerned about your fertility. What side effects may I notice from receiving this medication? Side effects that you should report to your doctor or health care professional as soon as possible: allergic reactions like skin rash, itching or hives, swelling of the face, lips, or tongue chest pain diarrhea flushing, runny nose, sweating during infusion low blood counts - this medicine may decrease the number of white blood cells, red blood cells and platelets. You may be at increased risk for infections and bleeding. nausea, vomiting pain, swelling, warmth in the leg signs of decreased platelets or bleeding - bruising, pinpoint red spots on the skin, black, tarry stools, blood in the urine signs of infection - fever or chills, cough, sore throat, pain or difficulty passing urine signs of decreased red blood cells - unusually weak or tired, fainting spells, lightheadedness Side effects that usually do not require medical attention (report to your doctor or health care professional if they continue or are bothersome): constipation hair loss headache loss of appetite mouth sores stomach pain This list may not describe all possible side effects. Call your doctor for medical advice about side effects. You may report side effects to FDA at 1-800-FDA-1088. Where should I keep my medication? This drug is given in a hospital or clinic and will not be stored at home. NOTE: This sheet is a summary. It may not cover all possible information. If you have questions about this medicine, talk to your doctor, pharmacist, or health care provider.  2022 [employer] Standard (2021-07-26 00:00:00) Leucovorin  injection What is this medication? LEUCOVORIN (loo koe VOR in) is used to prevent or treat the harmful effects of some medicines. This medicine is used to treat anemia caused by a low amount of folic acid in the body. It is also used with 5-fluorouracil (5-FU) to treat colon cancer. This medicine may be used for other purposes; ask your health care provider or pharmacist if you have questions. What should I tell my care team before I take this medication? They need to know if you have any of these conditions: anemia from low levels of vitamin B-12 in the blood an unusual or allergic reaction to leucovorin, folic acid, other medicines, foods, dyes, or preservatives pregnant or trying to get pregnant breast-feeding How should I use this medication? This medicine is for injection into a muscle or into a vein. It is given by a health care professional in a hospital or clinic setting. Talk to your pediatrician regarding the use of this medicine in children. Special care may be needed. Overdosage: If you think you have taken too much of this medicine contact a poison control center or emergency room at once. NOTE: This medicine is only for  you. Do not share this medicine with others. What if I miss a dose? This does not apply. What may interact with this medication? capecitabine fluorouracil phenobarbital phenytoin primidone trimethoprim-sulfamethoxazole This list may not describe all possible interactions. Give your health care provider a list of all the medicines, herbs, non-prescription drugs, or dietary supplements you use. Also tell them if you smoke, drink alcohol, or use illegal drugs. Some items may interact with your medicine. What should I watch for while using this medication? Your condition will be monitored carefully while you are receiving this medicine. This medicine may increase the side effects of 5-fluorouracil, 5-FU. Tell your doctor or health care professional if you have  diarrhea or mouth sores that do not get better or that get worse. What side effects may I notice from receiving this medication? Side effects that you should report to your doctor or health care professional as soon as possible: allergic reactions like skin rash, itching or hives, swelling of the face, lips, or tongue breathing problems fever, infection mouth sores unusual bleeding or bruising unusually weak or tired Side effects that usually do not require medical attention (report to your doctor or health care professional if they continue or are bothersome): constipation or diarrhea loss of appetite nausea, vomiting This list may not describe all possible side effects. Call your doctor for medical advice about side effects. You may report side effects to FDA at 1-800-FDA-1088. Where should I keep my medication? This drug is given in a hospital or clinic and will not be stored at home. NOTE: This sheet is a summary. It may not cover all possible information. If you have questions about this medicine, talk to your doctor, pharmacist, or health care provider.  2022 Elsevier/Gold Standard (2008-05-14 00:00:00) Fluorouracil, 5-FU injection What is this medication? FLUOROURACIL, 5-FU (flure oh YOOR a sil) is a chemotherapy drug. It slows the growth of cancer cells. This medicine is used to treat many types of cancer like breast cancer, colon or rectal cancer, pancreatic cancer, and stomach cancer. This medicine may be used for other purposes; ask your health care provider or pharmacist if you have questions. COMMON BRAND NAME(S): Adrucil What should I tell my care team before I take this medication? They need to know if you have any of these conditions: blood disorders dihydropyrimidine dehydrogenase (DPD) deficiency infection (especially a virus infection such as chickenpox, cold sores, or herpes) kidney disease liver disease malnourished, poor nutrition recent or ongoing radiation  therapy an unusual or allergic reaction to fluorouracil, other chemotherapy, other medicines, foods, dyes, or preservatives pregnant or trying to get pregnant breast-feeding How should I use this medication? This drug is given as an infusion or injection into a vein. It is administered in a hospital or clinic by a specially trained health care professional. Talk to your pediatrician regarding the use of this medicine in children. Special care may be needed. Overdosage: If you think you have taken too much of this medicine contact a poison control center or emergency room at once. NOTE: This medicine is only for you. Do not share this medicine with others. What if I miss a dose? It is important not to miss your dose. Call your doctor or health care professional if you are unable to keep an appointment. What may interact with this medication? Do not take this medicine with any of the following medications: live virus vaccines This medicine may also interact with the following medications: medicines that treat or prevent blood clots like  warfarin, enoxaparin, and dalteparin This list may not describe all possible interactions. Give your health care provider a list of all the medicines, herbs, non-prescription drugs, or dietary supplements you use. Also tell them if you smoke, drink alcohol, or use illegal drugs. Some items may interact with your medicine. What should I watch for while using this medication? Visit your doctor for checks on your progress. This drug may make you feel generally unwell. This is not uncommon, as chemotherapy can affect healthy cells as well as cancer cells. Report any side effects. Continue your course of treatment even though you feel ill unless your doctor tells you to stop. In some cases, you may be given additional medicines to help with side effects. Follow all directions for their use. Call your doctor or health care professional for advice if you get a fever,  chills or sore throat, or other symptoms of a cold or flu. Do not treat yourself. This drug decreases your body's ability to fight infections. Try to avoid being around people who are sick. This medicine may increase your risk to bruise or bleed. Call your doctor or health care professional if you notice any unusual bleeding. Be careful brushing and flossing your teeth or using a toothpick because you may get an infection or bleed more easily. If you have any dental work done, tell your dentist you are receiving this medicine. Avoid taking products that contain aspirin, acetaminophen, ibuprofen, naproxen, or ketoprofen unless instructed by your doctor. These medicines may hide a fever. Do not become pregnant while taking this medicine. Women should inform their doctor if they wish to become pregnant or think they might be pregnant. There is a potential for serious side effects to an unborn child. Talk to your health care professional or pharmacist for more information. Do not breast-feed an infant while taking this medicine. Men should inform their doctor if they wish to father a child. This medicine may lower sperm counts. Do not treat diarrhea with over the counter products. Contact your doctor if you have diarrhea that lasts more than 2 days or if it is severe and watery. This medicine can make you more sensitive to the sun. Keep out of the sun. If you cannot avoid being in the sun, wear protective clothing and use sunscreen. Do not use sun lamps or tanning beds/booths. What side effects may I notice from receiving this medication? Side effects that you should report to your doctor or health care professional as soon as possible: allergic reactions like skin rash, itching or hives, swelling of the face, lips, or tongue low blood counts - this medicine may decrease the number of white blood cells, red blood cells and platelets. You may be at increased risk for infections and bleeding. signs of  infection - fever or chills, cough, sore throat, pain or difficulty passing urine signs of decreased platelets or bleeding - bruising, pinpoint red spots on the skin, black, tarry stools, blood in the urine signs of decreased red blood cells - unusually weak or tired, fainting spells, lightheadedness breathing problems changes in vision chest pain mouth sores nausea and vomiting pain, swelling, redness at site where injected pain, tingling, numbness in the hands or feet redness, swelling, or sores on hands or feet stomach pain unusual bleeding Side effects that usually do not require medical attention (report to your doctor or health care professional if they continue or are bothersome): changes in finger or toe nails diarrhea dry or itchy skin hair loss headache  loss of appetite sensitivity of eyes to the light stomach upset unusually teary eyes This list may not describe all possible side effects. Call your doctor for medical advice about side effects. You may report side effects to FDA at 1-800-FDA-1088. Where should I keep my medication? This drug is given in a hospital or clinic and will not be stored at home. NOTE: This sheet is a summary. It may not cover all possible information. If you have questions about this medicine, talk to your doctor, pharmacist, or health care provider.  2022 [employer]/Gold Standard (2021-07-26 00:00:00)

## 2021-11-16 NOTE — Progress Notes (Signed)
[facility]   [phone]) [phone] Fax:([phone]   Clinic Follow up Note   Patient Care Team: [name], PA as PCP - General (Family Medicine) Lorretta Harp, MD as PCP - Cardiology (Cardiology) [name], MD as Consulting Physician (General Surgery) [name], [name], MD as Consulting Physician (Gastroenterology) [name], MD as Consulting Physician (Hematology) Lorretta Harp, MD as Consulting Physician (Cardiology) 11/16/2021  CHIEF COMPLAINT: Follow up cholangiocarcinoma   SUMMARY OF ONCOLOGIC HISTORY: Oncology History Overview Note  Cancer Staging Intrahepatic cholangiocarcinoma [facility]) Staging form: Intrahepatic Bile Duct, AJCC 8th Edition - Clinical stage from 08/19/2019: Stage II (cT2, cN0, cM0) - Signed by [name], MD on 08/19/2019    Intrahepatic cholangiocarcinoma ([place])  06/28/2019 Imaging   CT AP W Contrast 06/28/19  IMPRESSION: 1. Heterogeneous hypodensity posteriorly in the right hepatic lobe and potentially extending into the caudate lobe suspicious for a mass. There is felt to be truncation of branches of the portal vein in this vicinity and some narrowing of the hepatic vein, as well as triangular-shaped regions of abnormal hypoenhancement posteriorly in the right hepatic lobe likely representing downstream vascular effects. Cannot exclude malignancy such as cholangiocarcinoma or hepatocellular carcinoma, and follow up hepatic protocol MRI with and without contrast is recommended to further characterize. 2. 4 mm right middle lobe pulmonary nodule is likely benign but may merit surveillance. 3. Cholelithiasis. 4.  Aortic Atherosclerosis (ICD10-I70.0). 5. Prostatomegaly. 6. Mild impingement at L3-4 and L4-5.   07/31/2019 Imaging   MRI Liver 07/31/19 IMPRESSION: 1. 7.3 cm in long axis mass in the right hepatic lobe spanning into the caudate lobe, high suspicion for malignancy such as hepatocellular carcinoma or  cholangiocarcinoma. Suspected effacement or occlusion of the right hepatic vein and posterior branches of the right portal vein. Two smaller tumor nodules along the posterior periphery of the dominant mass. Tissue diagnosis is recommended. 2. No findings of pathologic adenopathy or distant metastatic spread. 3. 9 mm gallstone in the gallbladder. There is mild gallbladder wall thickening which may be from nondistention, correlate clinically in assessing for cholecystitis. 4.  Aortic Atherosclerosis (ICD10-I70.0). 5. Mild diffuse hepatic steatosis.   08/11/2019 Initial Biopsy   DIAGNOSIS: 08/11/19  A. LIVER, RIGHT, BIOPSY:  - Adenocarcinoma.   08/18/2019 Imaging   CT Chest 08/18/19  IMPRESSION: 1. Multiple pulmonary nodules largest at approximately 7 mm in the right lower lobe, nonspecific but concerning given findings in the liver. 2. No signs of definitive metastatic disease, also with mildly enlarged upper abdominal lymph nodes as discussed.   Aortic Atherosclerosis (ICD10-I70.0).   08/19/2019 Initial Diagnosis   Intrahepatic cholangiocarcinoma ([place])   08/19/2019 Cancer Staging   Staging form: Intrahepatic Bile Duct, AJCC 8th Edition - Clinical stage from 08/19/2019: Stage II (cT2, cN0, cM0) - Signed by [name], MD on 08/19/2019    09/29/2019 - 07/19/2020 Chemotherapy   Cisplatin and Gemcitabine 2 weeks on/1 week off starting 09/29/19. Cisplatin held from cycle 9 (03/23/20) due to fluid status/Afib. He is now on maintenance Gemcitabine. Held after 07/19/20 to proceed with liver target therapy.     10/09/2019 Imaging   CT AP IMPRESSION: 1. The dominant right hepatic lobe mass is minimally reduced in size compared to prior exams, currently measuring 6.2 by 5.3 cm, previously 6.2 by 5.6 cm. However, there is a new small hypodense lesion centrally in the right hepatic lobe which is suspicious for a new small focus of tumor. Accordingly this is an overall mixed appearance. 2. Continued  hypoenhancement in the liver downstream of the tumor likely attributable to narrowing or occlusion of the right hepatic vein by the tumor. By virtue of its location the tumor wraps around the intrahepatic portion of the IVC. 3. 4 mm right middle lobe pulmonary nodule, stable compared to earliest available comparison of 07/18/2019. Surveillance of the patient's pulmonary nodules is recommended. 4. Other imaging findings of potential clinical significance: Coronary atherosclerosis. Cholelithiasis. Prominent stool throughout the colon favors constipation. Moderate prostatomegaly with heterogeneous enhancement of the prostate gland. Lumbar spondylosis and degenerative disc disease causing mild bilateral foraminal impingement at L3-4 and L4-5.   Aortic Atherosclerosis (ICD10-I70.0).   12/24/2019 Imaging   CT CAP W Contrast  IMPRESSION: 1. Right hepatic lobe mass and adjacent right hepatic lobe nodules appear grossly stable. No evidence of distant metastatic disease. 2. Continued stability of small pulmonary nodules. Recommend attention on follow-up. 3. Cholelithiasis. 4. Enlarged prostate. 5. Aortic atherosclerosis (ICD10-I70.0). Coronary artery calcification.   02/23/2020 Procedure   He had PAC placed on 02/23/20.    04/08/2020 Imaging   CT CAP  IMPRESSION: 1. Stable or minimally decreased size of a very ill-defined, hypodense and somewhat retractile appearing mass of the central right lobe of the liver abutting the inferior vena cava and right portal vein, measuring approximately 5.7 x 5.0 cm, previously 6.2 x 5.2 cm when measured similarly. Findings are consistent with stable or minimally improved cholangiocarcinoma. 2. Small hypodense nodules of the right lobe identified on prior examination are poorly appreciated on this single phase contrast examination although not grossly changed. Attention on follow-up. 3. There are new, moderate bilateral pleural effusions and associated atelectasis  or consolidation as well as a new small pericardial effusion, nonspecific although generally concerning and suspicious for malignant effusions. There is no directly visualized pleural nodularity. 4. Multiple small pulmonary nodules are stable.  No new nodules. 5. Coronary artery disease. Aortic Atherosclerosis (ICD10-I70.0).   07/06/2020 Imaging   MRI ABD IMPRESSION: 1. Interval decrease in size of the right hepatic lobe lesion in the medial aspect of the segment 7. Findings would suggest a good response to treatment with some contraction of the tumor. 2. No new hepatic lesions. No abdominal adenopathy or metastatic disease. 3. Stable mild intrahepatic biliary dilatation in the right hepatic lobe distal to the lesion. 4. Somewhat tortuous and almost beaded appearance of the hepatic and portal vein radicles. Findings could be radiation related. 5. Cholelithiasis.  CT chest wo contrast IMPRESSION: 1. Persistent/stable moderate-sized bilateral pleural effusions with overlying atelectasis. 2. Stable small right pulmonary nodules. No new or progressive findings. Recommend continued surveillance. 3. No mediastinal or hilar mass or adenopathy. 4. Stable advanced three-vessel coronary artery calcifications. 5. Cholelithiasis. Aortic Atherosclerosis (ICD10-I70.0)   08/11/2020 Procedure   Y90 on 08/11/20 and 08/26/20 with Dr Laurence Ferrari    11/30/2020 Imaging   MRI Abdomen  IMPRESSION: 1. Today's study demonstrates progression of disease with enlarging central lesion in the right lobe of the liver involving portions of segments 7 and 8, now with evidence of some tumor thrombus extension into the intrahepatic portion of the inferior vena cava. This is associated with increasing intrahepatic biliary ductal dilatation in segment 7 of the liver. 2. In addition, there is some subtle hyperenhancement of the distal common bile duct at and immediately proximally to the level of the ampulla. There is  also some subtle delayed enhancement around this region in the pancreatic head. This is of uncertain etiology and significance, but may be inflammatory and currently is not  associated with proximal common bile duct dilatation. However, close attention on follow-up imaging is recommended. 3. Cholelithiasis without evidence of acute cholecystitis at this time. 4. Aortic atherosclerosis.     12/24/2020 - 03/21/2021 Chemotherapy   Restart Gemcitabine 2 weeks on/1 week off given disease progression beginning 12/24/20. Discontinued after 03/21/21 due to disease progression in liver.     03/16/2021 Imaging   MRI abdomen  IMPRESSION: No significant change in size of irregular hypovascular mass in the medial right hepatic lobe.   New 1.1 cm hypovascular lesion with peripheral rim enhancement in liver dome, suspicious for metastatic disease.   New small right pleural effusion and mild ascites.   Cholelithiasis, without evidence of cholecystitis or biliary dilatation.   04/05/2021 -  Chemotherapy   Second-line FOLFOX q2weeks starting 04/05/21    04/05/2021 -  Chemotherapy   Immunotherapy with Durvalumab (Imfinzi) q4weeks starting 04/05/21. (in addition to second line FOLFOX)   06/23/2021 Imaging   MRI Abdomen IMPRESSION: 1. No significant change in subcapsular mass of the posterior right lobe of the liver. 2. No significant change in an additional small hyperenhancing lesion of the liver dome, which remains suspicious for a satellite lesion. 3. No new liver lesions. 4. 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 5. Cholelithiasis without evidence of acute cholecystitis. 6. Trace ascites.  CT Chest w/o contrast IMPRESSION: 1. Occasional small pulmonary nodules in the right lung are stable, for example a 6 mm nodule right lower lobe and a 3 mm nodule of the lateral segment right middle lobe. These are nonspecific and likely benign, incidental sequelae of infection or inflammation,  metastatic disease not favored. Attention on follow-up. 2. Previously seen bilateral pleural effusions are resolved. 3. Hypodense lesion of the posterior right lobe of the liver, keeping with known cholangiocarcinoma and better characterized by same day MR. 4. Coronary artery disease.   10/21/2021 Imaging   EXAM: MRI ABDOMEN WITHOUT AND WITH CONTRAST  IMPRESSION: 1. Mild progression of dominant right hepatic lobe mass with involvement of the right portal, hepatic veins and minimal extension into the IVC. 2. Hepatic dome satellite lesion, new or more conspicuous today. 3. Cholelithiasis 4. Decrease in trace perihepatic and perisplenic ascites. 5.  Aortic Atherosclerosis (ICD10-I70.0).   10/21/2021 Imaging   EXAM: CT CHEST WITHOUT CONTRAST  IMPRESSION: 1. Since 06/23/2021, enlargement of a right lower lobe and possible enlargement of a right middle lobe pulmonary nodule. Findings are suspicious for metastatic disease. 2. No thoracic adenopathy. 3. Coronary artery atherosclerosis. Aortic Atherosclerosis (ICD10-I70.0).   11/01/2021 -  Chemotherapy   Patient is on Treatment Plan : PANCREAS FOLFIRI q14d       CURRENT THERAPY: FOLFIRI started on 11/01/21, due for cycle 2 today.   INTERVAL HISTORY: Mr. Scheff returns for follow-up for cholangiocarcinoma.  He is unaccompanied for this visit.  He reports that he tolerated the first cycle of FOLFIRI without any significant limitations.  Energy levels were unchanged and he continues to complete his daily activities on his own.  He has a great appetite and continues to eat well.  He denies any nausea, vomiting or abdominal pain.  His bowel habits are unchanged without any diarrhea or constipation.  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  He continues to take gabapentin 200 mg nightly.  He denies fevers, chills, night sweats, shortness of breath, chest pain, cough, peripheral edema or  rash.  He has no other complaints. Rest of the review  of systems is negative.    MEDICAL HISTORY:  Past Medical History:  Diagnosis Date   Arthritis    Diabetes ([place])    GERD (gastroesophageal reflux disease)    Hyperlipidemia    Hypertension    Intrahepatic cholangiocarcinoma ([place])     SURGICAL HISTORY: Past Surgical History:  Procedure Laterality Date   APPENDECTOMY  1980   CARDIOVERSION N/A 04/27/2020   Procedure: CARDIOVERSION;  Surgeon: Dorothy Spark, MD;  Location: [facility];  Service: Cardiovascular;  Laterality: N/A;   CARDIOVERSION N/A 05/19/2020   Procedure: CARDIOVERSION;  Surgeon: Elouise Munroe, MD;  Location: [facility];  Service: Cardiovascular;  Laterality: N/A;   COLONOSCOPY     IR 3D INDEPENDENT WKST  08/11/2020   IR 3D INDEPENDENT WKST  08/11/2020   IR ANGIOGRAM SELECTIVE EACH ADDITIONAL VESSEL  08/11/2020   IR ANGIOGRAM SELECTIVE EACH ADDITIONAL VESSEL  08/11/2020   IR ANGIOGRAM SELECTIVE EACH ADDITIONAL VESSEL  08/11/2020   IR ANGIOGRAM SELECTIVE EACH ADDITIONAL VESSEL  08/11/2020   IR ANGIOGRAM SELECTIVE EACH ADDITIONAL VESSEL  08/11/2020   IR ANGIOGRAM SELECTIVE EACH ADDITIONAL VESSEL  08/11/2020   IR ANGIOGRAM SELECTIVE EACH ADDITIONAL VESSEL  08/26/2020   IR ANGIOGRAM SELECTIVE EACH ADDITIONAL VESSEL  08/26/2020   IR ANGIOGRAM VISCERAL SELECTIVE  08/11/2020   IR ANGIOGRAM VISCERAL SELECTIVE  08/26/2020   IR EMBO ARTERIAL NOT HEMORR HEMANG INC GUIDE ROADMAPPING  08/11/2020   IR EMBO TUMOR ORGAN ISCHEMIA INFARCT INC GUIDE ROADMAPPING  08/26/2020   IR IMAGING GUIDED PORT INSERTION  02/23/2020   IR RADIOLOGIST EVAL & MGMT  07/14/2020   IR RADIOLOGIST EVAL & MGMT  09/30/2020   IR RADIOLOGIST EVAL & MGMT  12/01/2020   IR US GUIDE VASC ACCESS LEFT  08/26/2020   IR US GUIDE VASC ACCESS RIGHT  08/11/2020    I have reviewed the social history and family history with the patient and they are unchanged from previous note.  ALLERGIES:  has No Known  Allergies.  MEDICATIONS:  Current Outpatient Medications  Medication Sig Dispense Refill   allopurinol (ZYLOPRIM) 100 MG tablet Take 100 mg by mouth daily.     diltiazem (CARDIZEM CD) 240 MG 24 hr capsule TAKE 1 CAPSULE BY MOUTH EVERY DAY, STOP AMLODIPINE 90 capsule 3   ELIQUIS 5 MG TABS tablet TAKE 1 TABLET TWICE DAILY 180 tablet 1   fenofibrate (TRICOR) 145 MG tablet Take 145 mg by mouth daily.     finasteride (PROSCAR) 5 MG tablet Take 5 mg by mouth daily.     furosemide (LASIX) 40 MG tablet TAKE 2 TABLETS (80 MG TOTAL) BY MOUTH 2 (TWO) TIMES DAILY. 360 tablet 3   gabapentin (NEURONTIN) 100 MG capsule Take 100 mg by mouth at bedtime.     hydrALAZINE (APRESOLINE) 50 MG tablet Take 1 tablet (50 mg total) by mouth 2 (two) times daily. 180 tablet 2   KLOR-CON M20 20 MEQ tablet TAKE 1 TABLET BY MOUTH TWICE A DAY 180 tablet 3   lidocaine-prilocaine (EMLA) cream Apply 1 application topically as needed. 30 g 3   magnesium oxide (MAG-OX) 400 (240 Mg) MG tablet TAKE 1 TABLET BY MOUTH EVERY DAY 30 tablet 1   metFORMIN (GLUCOPHAGE-XR) 500 MG 24 hr tablet      metoprolol succinate (TOPROL XL) 25 MG 24 hr tablet Take 1 tablet (25 mg total) by mouth daily. 90 tablet 3   metoprolol tartrate (LOPRESSOR) 50 MG tablet      pantoprazole (PROTONIX) 40 MG tablet  TAKE 1 TABLET BY MOUTH TWICE A DAY 180 tablet 2   pravastatin (PRAVACHOL) 40 MG tablet Take 40 mg by mouth daily.     tamsulosin (FLOMAX) 0.4 MG CAPS capsule Take 0.4 mg by mouth daily.     colchicine 0.6 MG tablet Take 1 tablet (0.6 mg total) by mouth daily as needed (gout flare). Take daily for 3 days, then as needed for gout flare 30 tablet 0   ondansetron (ZOFRAN) 8 MG tablet TAKE 1 TABLET BY MOUTH TWICE A DAY AS NEEDED START ON 3RD DAY AFTER CHEMOTHERAPY (Patient not taking: Reported on 11/16/2021) 30 tablet 1   sucralfate (CARAFATE) 1 g tablet Take 1 tablet (1 g total) by mouth every 6 (six) hours as needed. Please schedule a yearly follow up for  further refills: [phone] (Patient not taking: Reported on 10/10/2021) 60 tablet 0   No current facility-administered medications for this visit.    PHYSICAL EXAMINATION: ECOG PERFORMANCE STATUS: 1 - Symptomatic but completely ambulatory  Vitals:   11/16/21 1215  BP: 131/60  Pulse: 73  Resp: 20  Temp: 97.9 F (36.6 C)  SpO2: 97%   Filed Weights   11/16/21 1215  Weight: 222 lb 4.8 oz (100.8 kg)    GENERAL:alert, no distress and comfortable SKIN: No rash EYES: sclera clear LUNGS: clear with normal breathing effort HEART: regular rate & rhythm ABDOMEN:abdomen soft, non-tender and normal bowel sounds NEURO: alert & oriented x 3 with fluent speech, no focal motor/sensory deficits.   PAC without erythema  LABORATORY DATA:  I have reviewed the data as listed CBC Latest Ref Rng & Units 11/16/2021 11/01/2021 10/24/2021  WBC 4.0 - 10.5 K/uL 5.2 5.0 6.5  Hemoglobin 13.0 - 17.0 g/dL 9.8(L) 11.0(L) 10.3(L)  Hematocrit 39.0 - 52.0 % 30.6(L) 33.2(L) 32.3(L)  Platelets 150 - 400 K/uL 84(L) 112(L) 80(L)     CMP Latest Ref Rng & Units 11/01/2021 10/24/2021 10/10/2021  Glucose 70 - 99 mg/dL 110(H) 163(H) 183(H)  BUN 8 - 23 mg/dL 19 18 18   Creatinine 0.61 - 1.24 mg/dL 1.53(H) 1.42(H) 1.55(H)  Sodium 135 - 145 mmol/L 139 137 138  Potassium 3.5 - 5.1 mmol/L 4.1 4.1 4.2  Chloride 98 - 111 mmol/L 108 108 110  CO2 22 - 32 mmol/L 21(L) 21(L) 20(L)  Calcium 8.9 - 10.3 mg/dL 9.2 9.1 9.1  Total Protein 6.5 - 8.1 g/dL 7.4 7.0 7.0  Total Bilirubin 0.3 - 1.2 mg/dL 0.4 0.5 0.4  Alkaline Phos 38 - 126 U/L 91 125 98  AST 15 - 41 U/L 33 28 42(H)  ALT 0 - 44 U/L 25 23 33      RADIOGRAPHIC STUDIES: I have personally reviewed the radiological images as listed and agreed with the findings in the report. No results found.   ASSESSMENT & PLAN: MARTI ACEBO is a 75 y.o. male with     1. Intrahepatic cholangiocarcinoma, cT2N0Mx, unresectable, with indeterminate lung nodules  -Diagnosed  in 07/2019. CT scans and MRI liver show a large 7.3cm mass in the right hepatic lobe which abuts portal vein. Both our local surgeon Dr. Barry Dienes and Dr Carlis Abbott at [facility] concluded that cancer is not resectable, and therefore not likely curable, due to the invasion to portal vein. -He began palliative systemic treatment standard first line chemo with IV Cisplatin and Gemcitabine 2 weeks on/1 week off. Started 09/29/19, tolerating well cisplatin was held with cycle 9 on 03/23/2020 due to fluid status/A. fib, now on maintenance gemcitabine alone -His FO  results showed MSI stable disease, IDH1 mutations he may benefit from IDH inhibitor in the future -he went to single agent gemcitabine q2 weeks on 12/24/20 until disease progression in liver on 03/16/21 MRI with concern for new liver lesion  -Started second line FOLFOX q2 weeks plus durvalumab q4 weeks starting 04/05/21 -Restaging chest CT and MRI abdomen 06/23/2021 showed stable disease in the liver, small and stable lung nodules, no new lesions -Dose reduced chemo from cycle 8 due to cytopenias, stable and tolerating well -Restaging CT chest and MRI abdomen 10/21/2021 showed progression in the lungs and liver, treatment changed to third line FOLFIRI started on 11/01/2021, dose reduced for cytopenias. -Due to ongoing cytopenias, further dose reduced 5FU from 2000 mg/m2 to 1800 mg/m2 and irinotecan from 150 mg/m2 to 110 mg/m2 starting today with Cycle 2.    2.  Lacrimation -He previously had blurry vision, began having watery eyes in 08/2021 affecting his vision and driving -Seen by ophthalmology, pending cataract surgery in November -Lacrimation did not improve off Imfinzi, immunotherapy was resumed -Lacrimation resolved and vision improved after cataract surgery   3. Nausea, Low food intake and weight loss   -With pain from GERD and nausea he initially lost 30 pounds.  -followed by dietician -weight fluctuation has some component of fluid retention in lower  extremities -Weight has improved. He denies any nausea/vomiting at this time.    4. Afib, CHF -diagnosed after PAC placement on 02/23/20. -CHADS2 score 2, moderate risk for stroke. Started on metoprolol and eliquis on 03/01/20. Tolerating well without bleeding.  -He was started on lasix 20 mg every other day after consult with Dr. Gwenlyn Found; his dyspnea and leg edema have improved -He had cardioversion on 05/19/2020 -continue f/u with cardiology, Dr. Gwenlyn Found  -Heart rhythm is normal on my exam today   5. DM, HTN, HLD, Gout -On Metformin, amlodipine, lisinopril, lasix, allopurinol -Continue to f/u with his PCP  -now on metoprolol and Eliquis for Afib -I previously reduced lasix to 1 tab BID rather than 2 tabs BID -monitoring BP   6. Nicotine Use -He never smoked but has been chewing Tobacco for the past 60 years. He no longer drinks alcohol and tells me he has quit smoking (11/25/19).    7. GERD and gastritis, history of esophageal candidiasis -He had repeated EGD with Dr. Havery Moros in 07/2019. 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 May repeat EGD in future  -GERD resolved on PPI and carafate  Disposition:  -Mr. Muccio tolerated cycle 1 of FOLFIRI very well. Labs from today were reviewed. CBC shows Hgb dropped to 9.8 and Plt dropped to 84K, secondary to chemotherapy. CMP shows stable creatinine levels.  -Recommend to proceed with cycle 2 of FOLFIRI but dose reduced 5FU from 2000 mg/m2 to 1800 mg/m2 and irinotecan from 150 mg/m2 to 110 mg/m2 starting today with Cycle 2.  -RTC on 11/28/2021 for port labs, office visit with Dr. Burr Medico and Cycle 3 of FOLFIRI.    All questions were answered. The patient knows to call the clinic with any problems, questions or concerns.  I have spent a total of 30 minutes minutes of face-to-face and non-face-to-face time, preparing to see the patient, performing a medically  appropriate examination, counseling and educating the patient, ordering medications/tests/procedures, documenting clinical information in the electronic health record, and care coordination.   Lincoln Brigham, PA-C Hematology and [place] P: [phone]  Patient  was seen with Dr. Burr Medico.   Addendum  I have seen the patient, examined him. I agree with the assessment and and plan and have edited the notes.   Dean tolerated first ccyle FOLFIRI very well. Lab reviewed, he has developed moderate to some cytopenias again.  I will cancel 5-FU bolus in his treatment plan.  We will reduce the rest of the chemo dose for this cycle due to his thrombocytopenia.  We will proceed cycle 2 treatment today.questions were answered.   [name]  11/16/2021

## 2021-11-16 NOTE — Progress Notes (Signed)
Ok to treat per Dr. Burr Medico with platelet count of 84.

## 2021-11-17 ENCOUNTER — Encounter: Payer: Self-pay | Admitting: Hematology

## 2021-11-18 ENCOUNTER — Other Ambulatory Visit: Payer: Self-pay

## 2021-11-18 ENCOUNTER — Inpatient Hospital Stay: Payer: Medicare HMO

## 2021-11-18 VITALS — BP 142/69 | HR 69 | Temp 98.0°F | Resp 20

## 2021-11-18 DIAGNOSIS — Z5189 Encounter for other specified aftercare: Secondary | ICD-10-CM | POA: Diagnosis not present

## 2021-11-18 DIAGNOSIS — E119 Type 2 diabetes mellitus without complications: Secondary | ICD-10-CM | POA: Diagnosis not present

## 2021-11-18 DIAGNOSIS — I110 Hypertensive heart disease with heart failure: Secondary | ICD-10-CM | POA: Diagnosis not present

## 2021-11-18 DIAGNOSIS — Z79899 Other long term (current) drug therapy: Secondary | ICD-10-CM | POA: Diagnosis not present

## 2021-11-18 DIAGNOSIS — Z5111 Encounter for antineoplastic chemotherapy: Secondary | ICD-10-CM | POA: Diagnosis not present

## 2021-11-18 DIAGNOSIS — Z7189 Other specified counseling: Secondary | ICD-10-CM

## 2021-11-18 DIAGNOSIS — I509 Heart failure, unspecified: Secondary | ICD-10-CM | POA: Diagnosis not present

## 2021-11-18 DIAGNOSIS — C221 Intrahepatic bile duct carcinoma: Secondary | ICD-10-CM

## 2021-11-18 DIAGNOSIS — F17220 Nicotine dependence, chewing tobacco, uncomplicated: Secondary | ICD-10-CM | POA: Diagnosis not present

## 2021-11-18 DIAGNOSIS — I4891 Unspecified atrial fibrillation: Secondary | ICD-10-CM | POA: Diagnosis not present

## 2021-11-18 MED ORDER — PEGFILGRASTIM-CBQV 6 MG/0.6ML ~~LOC~~ SOSY
6.0000 mg | PREFILLED_SYRINGE | Freq: Once | SUBCUTANEOUS | Status: AC
  Administered 2021-11-18: 15:00:00 6 mg via SUBCUTANEOUS
  Filled 2021-11-18: qty 0.6

## 2021-11-18 MED ORDER — SODIUM CHLORIDE 0.9% FLUSH
10.0000 mL | INTRAVENOUS | Status: DC | PRN
  Administered 2021-11-18: 15:00:00 10 mL

## 2021-11-18 MED ORDER — HEPARIN SOD (PORK) LOCK FLUSH 100 UNIT/ML IV SOLN
500.0000 [IU] | Freq: Once | INTRAVENOUS | Status: AC | PRN
  Administered 2021-11-18: 15:00:00 500 [IU]

## 2021-11-25 MED FILL — Dexamethasone Sodium Phosphate Inj 100 MG/10ML: INTRAMUSCULAR | Qty: 1 | Status: AC

## 2021-11-28 ENCOUNTER — Inpatient Hospital Stay: Payer: Medicare HMO | Attending: Hematology

## 2021-11-28 ENCOUNTER — Inpatient Hospital Stay: Payer: Medicare HMO

## 2021-11-28 ENCOUNTER — Inpatient Hospital Stay: Payer: Medicare HMO | Admitting: Hematology

## 2021-11-28 ENCOUNTER — Other Ambulatory Visit: Payer: Self-pay

## 2021-11-28 ENCOUNTER — Encounter: Payer: Self-pay | Admitting: Hematology

## 2021-11-28 VITALS — BP 134/62 | HR 60 | Temp 98.1°F | Resp 18 | Ht 69.0 in | Wt 218.6 lb

## 2021-11-28 VITALS — BP 129/76 | HR 80 | Temp 98.2°F | Resp 17

## 2021-11-28 DIAGNOSIS — Z5189 Encounter for other specified aftercare: Secondary | ICD-10-CM | POA: Diagnosis not present

## 2021-11-28 DIAGNOSIS — C221 Intrahepatic bile duct carcinoma: Secondary | ICD-10-CM

## 2021-11-28 DIAGNOSIS — Z5111 Encounter for antineoplastic chemotherapy: Secondary | ICD-10-CM | POA: Insufficient documentation

## 2021-11-28 DIAGNOSIS — Z7189 Other specified counseling: Secondary | ICD-10-CM

## 2021-11-28 DIAGNOSIS — Z95828 Presence of other vascular implants and grafts: Secondary | ICD-10-CM

## 2021-11-28 LAB — CMP (CANCER CENTER ONLY)
ALT: 25 U/L (ref 0–44)
AST: 29 U/L (ref 15–41)
Albumin: 3.7 g/dL (ref 3.5–5.0)
Alkaline Phosphatase: 114 U/L (ref 38–126)
Anion gap: 7 (ref 5–15)
BUN: 16 mg/dL (ref 8–23)
CO2: 24 mmol/L (ref 22–32)
Calcium: 9.4 mg/dL (ref 8.9–10.3)
Chloride: 106 mmol/L (ref 98–111)
Creatinine: 1.64 mg/dL — ABNORMAL HIGH (ref 0.61–1.24)
GFR, Estimated: 44 mL/min — ABNORMAL LOW (ref >60)
Glucose, Bld: 148 mg/dL — ABNORMAL HIGH (ref 70–99)
Potassium: 3.9 mmol/L (ref 3.5–5.1)
Sodium: 137 mmol/L (ref 135–145)
Total Bilirubin: 0.4 mg/dL (ref 0.3–1.2)
Total Protein: 6.8 g/dL (ref 6.5–8.1)

## 2021-11-28 LAB — CBC WITH DIFFERENTIAL (CANCER CENTER ONLY)
Abs Immature Granulocytes: 0.12 10*3/uL — ABNORMAL HIGH (ref 0.00–0.07)
Basophils Absolute: 0.1 10*3/uL (ref 0.0–0.1)
Basophils Relative: 1 %
Eosinophils Absolute: 0.1 10*3/uL (ref 0.0–0.5)
Eosinophils Relative: 1 %
HCT: 32 % — ABNORMAL LOW (ref 39.0–52.0)
Hemoglobin: 10.2 g/dL — ABNORMAL LOW (ref 13.0–17.0)
Immature Granulocytes: 1 %
Lymphocytes Relative: 7 %
Lymphs Abs: 0.7 10*3/uL (ref 0.7–4.0)
MCH: 32.6 pg (ref 26.0–34.0)
MCHC: 31.9 g/dL (ref 30.0–36.0)
MCV: 102.2 fL — ABNORMAL HIGH (ref 80.0–100.0)
Monocytes Absolute: 1 10*3/uL (ref 0.1–1.0)
Monocytes Relative: 11 %
Neutro Abs: 6.9 10*3/uL (ref 1.7–7.7)
Neutrophils Relative %: 79 %
Platelet Count: 109 10*3/uL — ABNORMAL LOW (ref 150–400)
RBC: 3.13 MIL/uL — ABNORMAL LOW (ref 4.22–5.81)
RDW: 16.8 % — ABNORMAL HIGH (ref 11.5–15.5)
WBC Count: 8.8 10*3/uL (ref 4.0–10.5)
nRBC: 0.2 % (ref 0.0–0.2)

## 2021-11-28 LAB — MAGNESIUM: Magnesium: 1.8 mg/dL (ref 1.7–2.4)

## 2021-11-28 MED ORDER — PALONOSETRON HCL INJECTION 0.25 MG/5ML
0.2500 mg | Freq: Once | INTRAVENOUS | Status: AC
  Administered 2021-11-28: 0.25 mg via INTRAVENOUS
  Filled 2021-11-28: qty 5

## 2021-11-28 MED ORDER — SODIUM CHLORIDE 0.9 % IV SOLN
130.0000 mg/m2 | Freq: Once | INTRAVENOUS | Status: AC
  Administered 2021-11-28: 280 mg via INTRAVENOUS
  Filled 2021-11-28: qty 14

## 2021-11-28 MED ORDER — SODIUM CHLORIDE 0.9 % IV SOLN: Freq: Once | INTRAVENOUS | Status: AC

## 2021-11-28 MED ORDER — SODIUM CHLORIDE 0.9 % IV SOLN
2000.0000 mg/m2 | INTRAVENOUS | Status: DC
  Administered 2021-11-28: 4350 mg via INTRAVENOUS
  Filled 2021-11-28: qty 87

## 2021-11-28 MED ORDER — SODIUM CHLORIDE 0.9 % IV SOLN
10.0000 mg | Freq: Once | INTRAVENOUS | Status: AC
  Administered 2021-11-28: 10 mg via INTRAVENOUS
  Filled 2021-11-28: qty 10

## 2021-11-28 MED ORDER — SODIUM CHLORIDE 0.9% FLUSH
10.0000 mL | INTRAVENOUS | Status: DC | PRN
  Administered 2021-11-28: 10 mL

## 2021-11-28 MED ORDER — SODIUM CHLORIDE 0.9% FLUSH
10.0000 mL | Freq: Once | INTRAVENOUS | Status: AC
  Administered 2021-11-28: 10 mL

## 2021-11-28 MED ORDER — SODIUM CHLORIDE 0.9 % IV SOLN
400.0000 mg/m2 | Freq: Once | INTRAVENOUS | Status: AC
  Administered 2021-11-28: 872 mg via INTRAVENOUS
  Filled 2021-11-28: qty 43.6

## 2021-11-28 NOTE — Progress Notes (Signed)
Per MD change irinotecan to 130mg .m2 for today and leave 5-FU pump at 2000mg /m2

## 2021-11-28 NOTE — Patient Instructions (Signed)
[facility]  Discharge Instructions: Thank you for choosing [facility] to provide your oncology and hematology care.   If you have a lab appointment with the [facility], please go directly to the [facility] and check in at the registration area.   Wear comfortable clothing and clothing appropriate for easy access to any Portacath or PICC line.   We strive to give you quality time with your provider. You may need to reschedule your appointment if you arrive late (15 or more minutes).  Arriving late affects you and other patients whose appointments are after yours.  Also, if you miss three or more appointments without notifying the office, you may be dismissed from the clinic at the providers discretion.      For prescription refill requests, have your pharmacy contact our office and allow 72 hours for refills to be completed.    Today you received the following chemotherapy and/or immunotherapy agents: Irinotecan and fluorouracil       To help prevent nausea and vomiting after your treatment, we encourage you to take your nausea medication as directed.  BELOW ARE SYMPTOMS THAT SHOULD BE REPORTED IMMEDIATELY: *FEVER GREATER THAN 100.4 F (38 C) OR HIGHER *CHILLS OR SWEATING *NAUSEA AND VOMITING THAT IS NOT CONTROLLED WITH YOUR NAUSEA MEDICATION *UNUSUAL SHORTNESS OF BREATH *UNUSUAL BRUISING OR BLEEDING *URINARY PROBLEMS (pain or burning when urinating, or frequent urination) *BOWEL PROBLEMS (unusual diarrhea, constipation, pain near the anus) TENDERNESS IN MOUTH AND THROAT WITH OR WITHOUT PRESENCE OF ULCERS (sore throat, sores in mouth, or a toothache) UNUSUAL RASH, SWELLING OR PAIN  UNUSUAL VAGINAL DISCHARGE OR ITCHING   Items with * indicate a potential emergency and should be followed up as soon as possible or go to the Emergency Department if any problems should occur.  Please show the CHEMOTHERAPY ALERT CARD or IMMUNOTHERAPY ALERT  CARD at check-in to the Emergency Department and triage nurse.  Should you have questions after your visit or need to cancel or reschedule your appointment, please contact [facility]  Dept: [phone]  and follow the prompts.  Office hours are 8:00 a.m. to 4:30 p.m. Monday - Friday. Please note that voicemails left after 4:00 p.m. may not be returned until the following business day.  We are closed weekends and major holidays. You have access to a nurse at all times for urgent questions. Please call the main number to the clinic Dept: [phone] and follow the prompts.   For any non-urgent questions, you may also contact your provider using MyChart. We now offer e-Visits for anyone 32 and older to request care online for non-urgent symptoms. For details visit [email or web address].   Also download the MyChart app! Go to the app store, search "MyChart", open the app, select [facility], and log in with your MyChart username and password.  Due to Covid, a mask is required upon entering the hospital/clinic. If you do not have a mask, one will be given to you upon arrival. For doctor visits, patients may have 1 support person aged 17 or older with them. For treatment visits, patients cannot have anyone with them due to current Covid guidelines and our immunocompromised population.

## 2021-11-28 NOTE — Progress Notes (Signed)
[place]   [phone] [phone]   Clinic Follow up Note   Patient Care Team: [name], PA as PCP - General (Family Medicine) Lorretta Harp, MD as PCP - Cardiology (Cardiology) [name], MD as Consulting Physician (General Surgery) [name], [name], MD as Consulting Physician (Gastroenterology) [name], MD as Consulting Physician (Hematology) Lorretta Harp, MD as Consulting Physician (Cardiology)  Date of Service:  11/28/2021  CHIEF COMPLAINT: f/u of cholangiocarcinoma   CURRENT THERAPY:  FOLFIRI q14d, started on 11/01/21  ASSESSMENT & PLAN:  Brandon Sherman is a 76 y.o. male with   1. Intrahepatic cholangiocarcinoma, cT2N0Mx, unresectable, with indeterminate lung nodules  -Diagnosed in 07/2019. CT scans and MRI liver show a large 7.3cm mass in the right hepatic lobe which abuts portal vein. Both our local surgeon Dr. Barry Dienes and Dr Carlis Abbott at [facility] concluded that cancer is not resectable, and therefore not likely curable, due to the invasion to portal vein. -He began palliative systemic treatment standard first line chemo with IV Cisplatin and Gemcitabine 2 weeks on/1 week off. Started 09/29/19, tolerating well cisplatin was held with cycle 9 on 03/23/20 due to fluid status/A. fib, now on maintenance gemcitabine alone -His FO results showed MSI stable disease, IDH1 mutations he may benefit from IDH inhibitor in the future -he went to single agent gemcitabine q2 weeks on 12/24/20 until disease progression in liver on 03/16/21 MRI with concern for new liver lesion  -Started second line FOLFOX q2 weeks plus durvalumab q4 weeks starting 04/05/21. Dose reduced from cycle 8 due to cytopenias -Restaging CT chest and MRI abdomen 10/21/21 showed progression in the lungs and liver. -changed to third line FOLFIRI started on 11/01/21, dose reduced for cytopenias. -Due to ongoing cytopenias, further dose reduced 5FU from 2000 mg/m2 to 1800 mg/m2  and irinotecan from 150 mg/m2 to 110 mg/m2 starting today with Cycle 2.  -he is tolerating FOLFIRI well overall. Labs reviewed, no concern, adequate to proceed with treatment today.  2. Neuropathy G1 -he has mild numbness to his fingertips and toes, does not affect his functionality. -will monitor   3. Afib, CHF -diagnosed after PAC placement on 02/23/20. -CHADS2 score 2, moderate risk for stroke. Started on metoprolol and eliquis on 03/01/20. Tolerating well without bleeding.  -He had cardioversion on 05/19/20 -continue f/u with cardiology, Dr. Gwenlyn Found    5. DM, HTN, HLD, Gout -On Metformin, amlodipine, lisinopril, lasix, allopurinol -Continue to f/u with his PCP  -I previously reduced lasix to 1 tab BID rather than 2 tabs BID -monitoring BP and BG   6. Nicotine Use -He never smoked but has been chewing Tobacco for the past 60 years. He no longer drinks alcohol and tells me he has quit smoking (11/25/19).    7. GERD and gastritis, history of esophageal candidiasis -He had repeated EGD with Dr. Havery Moros in 07/2019. 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 May repeat EGD in future  -GERD resolved on PPI and carafate    Plan: -proceed with C3 FOLFIRI, will change dose to irinotecan 13m/m2 and 5-fu 20040mm2, with Udenyca on day 3  -lab, flush, f/u, and FOLFIRI in 2, 4, and 6 weeks   No problem-specific Assessment & Plan notes found for this encounter.   SUMMARY OF ONCOLOGIC HISTORY: Oncology History Overview Note  Cancer Staging Intrahepatic cholangiocarcinoma ([place] form: Intrahepatic Bile Duct, AJCC 8th Edition - Clinical stage from  08/19/2019: Stage II (cT2, cN0, cM0) - Signed by [name], MD on 08/19/2019    Intrahepatic cholangiocarcinoma ([place])  06/28/2019 Imaging   CT AP W Contrast 06/28/19  IMPRESSION: 1. Heterogeneous hypodensity posteriorly in the right hepatic lobe and  potentially extending into the caudate lobe suspicious for a mass. There is felt to be truncation of branches of the portal vein in this vicinity and some narrowing of the hepatic vein, as well as triangular-shaped regions of abnormal hypoenhancement posteriorly in the right hepatic lobe likely representing downstream vascular effects. Cannot exclude malignancy such as cholangiocarcinoma or hepatocellular carcinoma, and follow up hepatic protocol MRI with and without contrast is recommended to further characterize. 2. 4 mm right middle lobe pulmonary nodule is likely benign but may merit surveillance. 3. Cholelithiasis. 4.  Aortic Atherosclerosis (ICD10-I70.0). 5. Prostatomegaly. 6. Mild impingement at L3-4 and L4-5.   07/31/2019 Imaging   MRI Liver 07/31/19 IMPRESSION: 1. 7.3 cm in long axis mass in the right hepatic lobe spanning into the caudate lobe, high suspicion for malignancy such as hepatocellular carcinoma or cholangiocarcinoma. Suspected effacement or occlusion of the right hepatic vein and posterior branches of the right portal vein. Two smaller tumor nodules along the posterior periphery of the dominant mass. Tissue diagnosis is recommended. 2. No findings of pathologic adenopathy or distant metastatic spread. 3. 9 mm gallstone in the gallbladder. There is mild gallbladder wall thickening which may be from nondistention, correlate clinically in assessing for cholecystitis. 4.  Aortic Atherosclerosis (ICD10-I70.0). 5. Mild diffuse hepatic steatosis.   08/11/2019 Initial Biopsy   DIAGNOSIS: 08/11/19  A. LIVER, RIGHT, BIOPSY:  - Adenocarcinoma.   08/18/2019 Imaging   CT Chest 08/18/19  IMPRESSION: 1. Multiple pulmonary nodules largest at approximately 7 mm in the right lower lobe, nonspecific but concerning given findings in the liver. 2. No signs of definitive metastatic disease, also with mildly enlarged upper abdominal lymph nodes as discussed.   Aortic Atherosclerosis  (ICD10-I70.0).   08/19/2019 Initial Diagnosis   Intrahepatic cholangiocarcinoma ([place])   08/19/2019 Cancer Staging   Staging form: Intrahepatic Bile Duct, AJCC 8th Edition - Clinical stage from 08/19/2019: Stage II (cT2, cN0, cM0) - Signed by [name], MD on 08/19/2019    09/29/2019 - 07/19/2020 Chemotherapy   Cisplatin and Gemcitabine 2 weeks on/1 week off starting 09/29/19. Cisplatin held from cycle 9 (03/23/20) due to fluid status/Afib. He is now on maintenance Gemcitabine. Held after 07/19/20 to proceed with liver target therapy.     10/09/2019 Imaging   CT AP IMPRESSION: 1. The dominant right hepatic lobe mass is minimally reduced in size compared to prior exams, currently measuring 6.2 by 5.3 cm, previously 6.2 by 5.6 cm. However, there is a new small hypodense lesion centrally in the right hepatic lobe which is suspicious for a new small focus of tumor. Accordingly this is an overall mixed appearance. 2. Continued hypoenhancement in the liver downstream of the tumor likely attributable to narrowing or occlusion of the right hepatic vein by the tumor. By virtue of its location the tumor wraps around the intrahepatic portion of the IVC. 3. 4 mm right middle lobe pulmonary nodule, stable compared to earliest available comparison of 07/18/2019. Surveillance of the patient's pulmonary nodules is recommended. 4. Other imaging findings of potential clinical significance: Coronary atherosclerosis. Cholelithiasis. Prominent stool throughout the colon favors constipation. Moderate prostatomegaly with heterogeneous enhancement of the prostate gland. Lumbar spondylosis and degenerative disc disease causing mild bilateral foraminal impingement at L3-4 and L4-5.   Aortic  Atherosclerosis (ICD10-I70.0).   12/24/2019 Imaging   CT CAP W Contrast  IMPRESSION: 1. Right hepatic lobe mass and adjacent right hepatic lobe nodules appear grossly stable. No evidence of distant metastatic disease. 2. Continued  stability of small pulmonary nodules. Recommend attention on follow-up. 3. Cholelithiasis. 4. Enlarged prostate. 5. Aortic atherosclerosis (ICD10-I70.0). Coronary artery calcification.   02/23/2020 Procedure   He had PAC placed on 02/23/20.    04/08/2020 Imaging   CT CAP  IMPRESSION: 1. Stable or minimally decreased size of a very ill-defined, hypodense and somewhat retractile appearing mass of the central right lobe of the liver abutting the inferior vena cava and right portal vein, measuring approximately 5.7 x 5.0 cm, previously 6.2 x 5.2 cm when measured similarly. Findings are consistent with stable or minimally improved cholangiocarcinoma. 2. Small hypodense nodules of the right lobe identified on prior examination are poorly appreciated on this single phase contrast examination although not grossly changed. Attention on follow-up. 3. There are new, moderate bilateral pleural effusions and associated atelectasis or consolidation as well as a new small pericardial effusion, nonspecific although generally concerning and suspicious for malignant effusions. There is no directly visualized pleural nodularity. 4. Multiple small pulmonary nodules are stable.  No new nodules. 5. Coronary artery disease. Aortic Atherosclerosis (ICD10-I70.0).   07/06/2020 Imaging   MRI ABD IMPRESSION: 1. Interval decrease in size of the right hepatic lobe lesion in the medial aspect of the segment 7. Findings would suggest a good response to treatment with some contraction of the tumor. 2. No new hepatic lesions. No abdominal adenopathy or metastatic disease. 3. Stable mild intrahepatic biliary dilatation in the right hepatic lobe distal to the lesion. 4. Somewhat tortuous and almost beaded appearance of the hepatic and portal vein radicles. Findings could be radiation related. 5. Cholelithiasis.  CT chest wo contrast IMPRESSION: 1. Persistent/stable moderate-sized bilateral pleural effusions  with overlying atelectasis. 2. Stable small right pulmonary nodules. No new or progressive findings. Recommend continued surveillance. 3. No mediastinal or hilar mass or adenopathy. 4. Stable advanced three-vessel coronary artery calcifications. 5. Cholelithiasis. Aortic Atherosclerosis (ICD10-I70.0)   08/11/2020 Procedure   Y90 on 08/11/20 and 08/26/20 with Dr Laurence Ferrari    11/30/2020 Imaging   MRI Abdomen  IMPRESSION: 1. Today's study demonstrates progression of disease with enlarging central lesion in the right lobe of the liver involving portions of segments 7 and 8, now with evidence of some tumor thrombus extension into the intrahepatic portion of the inferior vena cava. This is associated with increasing intrahepatic biliary ductal dilatation in segment 7 of the liver. 2. In addition, there is some subtle hyperenhancement of the distal common bile duct at and immediately proximally to the level of the ampulla. There is also some subtle delayed enhancement around this region in the pancreatic head. This is of uncertain etiology and significance, but may be inflammatory and currently is not associated with proximal common bile duct dilatation. However, close attention on follow-up imaging is recommended. 3. Cholelithiasis without evidence of acute cholecystitis at this time. 4. Aortic atherosclerosis.     12/24/2020 - 03/21/2021 Chemotherapy   Restart Gemcitabine 2 weeks on/1 week off given disease progression beginning 12/24/20. Discontinued after 03/21/21 due to disease progression in liver.     03/16/2021 Imaging   MRI abdomen  IMPRESSION: No significant change in size of irregular hypovascular mass in the medial right hepatic lobe.   New 1.1 cm hypovascular lesion with peripheral rim enhancement in liver dome, suspicious for metastatic disease.  New small right pleural effusion and mild ascites.   Cholelithiasis, without evidence of cholecystitis or biliary dilatation.    04/05/2021 -  Chemotherapy   Second-line FOLFOX q2weeks starting 04/05/21    04/05/2021 -  Chemotherapy   Immunotherapy with Durvalumab (Imfinzi) q4weeks starting 04/05/21. (in addition to second line FOLFOX)   06/23/2021 Imaging   MRI Abdomen IMPRESSION: 1. No significant change in subcapsular mass of the posterior right lobe of the liver. 2. No significant change in an additional small hyperenhancing lesion of the liver dome, which remains suspicious for a satellite lesion. 3. No new liver lesions. 4. 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 5. Cholelithiasis without evidence of acute cholecystitis. 6. Trace ascites.  CT Chest w/o contrast IMPRESSION: 1. Occasional small pulmonary nodules in the right lung are stable, for example a 6 mm nodule right lower lobe and a 3 mm nodule of the lateral segment right middle lobe. These are nonspecific and likely benign, incidental sequelae of infection or inflammation, metastatic disease not favored. Attention on follow-up. 2. Previously seen bilateral pleural effusions are resolved. 3. Hypodense lesion of the posterior right lobe of the liver, keeping with known cholangiocarcinoma and better characterized by same day MR. 4. Coronary artery disease.   10/21/2021 Imaging   EXAM: MRI ABDOMEN WITHOUT AND WITH CONTRAST  IMPRESSION: 1. Mild progression of dominant right hepatic lobe mass with involvement of the right portal, hepatic veins and minimal extension into the IVC. 2. Hepatic dome satellite lesion, new or more conspicuous today. 3. Cholelithiasis 4. Decrease in trace perihepatic and perisplenic ascites. 5.  Aortic Atherosclerosis (ICD10-I70.0).   10/21/2021 Imaging   EXAM: CT CHEST WITHOUT CONTRAST  IMPRESSION: 1. Since 06/23/2021, enlargement of a right lower lobe and possible enlargement of a right middle lobe pulmonary nodule. Findings are suspicious for metastatic disease. 2. No thoracic adenopathy. 3.  Coronary artery atherosclerosis. Aortic Atherosclerosis (ICD10-I70.0).   11/01/2021 -  Chemotherapy   Patient is on Treatment Plan : PANCREAS FOLFIRI q14d        INTERVAL HISTORY:  Brandon Sherman is here for a follow up of cholangiocarcinoma . He was last seen by me on 11/16/21 with PA Murray Hodgkins. He presents to the clinic accompanied by his wife. He reports feeling well overall on treatment. He does report continued numbness in his fingertips and toes, which is stable. He denies issues walking or picking up small objects.   All other systems were reviewed with the patient and are negative.  MEDICAL HISTORY:  Past Medical History:  Diagnosis Date   Arthritis    Diabetes ([place])    GERD (gastroesophageal reflux disease)    Hyperlipidemia    Hypertension    Intrahepatic cholangiocarcinoma ([facility])     SURGICAL HISTORY: Past Surgical History:  Procedure Laterality Date   APPENDECTOMY  1980   CARDIOVERSION N/A 04/27/2020   Procedure: CARDIOVERSION;  Surgeon: Dorothy Spark, MD;  Location: [facility] ENDOSCOPY;  Service: Cardiovascular;  Laterality: N/A;   CARDIOVERSION N/A 05/19/2020   Procedure: CARDIOVERSION;  Surgeon: Elouise Munroe, MD;  Location: [place];  Service: Cardiovascular;  Laterality: N/A;   COLONOSCOPY     IR 3D INDEPENDENT WKST  08/11/2020   IR 3D INDEPENDENT WKST  08/11/2020   IR ANGIOGRAM SELECTIVE EACH ADDITIONAL VESSEL  08/11/2020   IR ANGIOGRAM SELECTIVE EACH ADDITIONAL VESSEL  08/11/2020   IR ANGIOGRAM SELECTIVE EACH ADDITIONAL VESSEL  08/11/2020   IR ANGIOGRAM SELECTIVE EACH ADDITIONAL VESSEL  08/11/2020   IR ANGIOGRAM SELECTIVE  EACH ADDITIONAL VESSEL  08/11/2020   IR ANGIOGRAM SELECTIVE EACH ADDITIONAL VESSEL  08/11/2020   IR ANGIOGRAM SELECTIVE EACH ADDITIONAL VESSEL  08/26/2020   IR ANGIOGRAM SELECTIVE EACH ADDITIONAL VESSEL  08/26/2020   IR ANGIOGRAM VISCERAL SELECTIVE  08/11/2020   IR ANGIOGRAM VISCERAL SELECTIVE  08/26/2020   IR EMBO ARTERIAL NOT HEMORR HEMANG  INC GUIDE ROADMAPPING  08/11/2020   IR EMBO TUMOR ORGAN ISCHEMIA INFARCT INC GUIDE ROADMAPPING  08/26/2020   IR IMAGING GUIDED PORT INSERTION  02/23/2020   IR RADIOLOGIST EVAL & MGMT  07/14/2020   IR RADIOLOGIST EVAL & MGMT  09/30/2020   IR RADIOLOGIST EVAL & MGMT  12/01/2020   IR US GUIDE VASC ACCESS LEFT  08/26/2020   IR US GUIDE VASC ACCESS RIGHT  08/11/2020    I have reviewed the social history and family history with the patient and they are unchanged from previous note.  ALLERGIES:  has No Known Allergies.  MEDICATIONS:  Current Outpatient Medications  Medication Sig Dispense Refill   allopurinol (ZYLOPRIM) 100 MG tablet Take 100 mg by mouth daily.     colchicine 0.6 MG tablet Take 1 tablet (0.6 mg total) by mouth daily as needed (gout flare). Take daily for 3 days, then as needed for gout flare 30 tablet 0   diltiazem (CARDIZEM CD) 240 MG 24 hr capsule TAKE 1 CAPSULE BY MOUTH EVERY DAY, STOP AMLODIPINE 90 capsule 3   ELIQUIS 5 MG TABS tablet TAKE 1 TABLET TWICE DAILY 180 tablet 1   fenofibrate (TRICOR) 145 MG tablet Take 145 mg by mouth daily.     finasteride (PROSCAR) 5 MG tablet Take 5 mg by mouth daily.     furosemide (LASIX) 40 MG tablet TAKE 2 TABLETS (80 MG TOTAL) BY MOUTH 2 (TWO) TIMES DAILY. 360 tablet 3   gabapentin (NEURONTIN) 100 MG capsule Take 100 mg by mouth at bedtime.     hydrALAZINE (APRESOLINE) 50 MG tablet Take 1 tablet (50 mg total) by mouth 2 (two) times daily. 180 tablet 2   KLOR-CON M20 20 MEQ tablet TAKE 1 TABLET BY MOUTH TWICE A DAY 180 tablet 3   lidocaine-prilocaine (EMLA) cream Apply 1 application topically as needed. 30 g 3   magnesium oxide (MAG-OX) 400 (240 Mg) MG tablet TAKE 1 TABLET BY MOUTH EVERY DAY 30 tablet 1   metFORMIN (GLUCOPHAGE-XR) 500 MG 24 hr tablet      metoprolol succinate (TOPROL XL) 25 MG 24 hr tablet Take 1 tablet (25 mg total) by mouth daily. 90 tablet 3   metoprolol tartrate (LOPRESSOR) 50 MG tablet      ondansetron (ZOFRAN) 8 MG  tablet TAKE 1 TABLET BY MOUTH TWICE A DAY AS NEEDED START ON 3RD DAY AFTER CHEMOTHERAPY (Patient not taking: Reported on 11/16/2021) 30 tablet 1   pantoprazole (PROTONIX) 40 MG tablet TAKE 1 TABLET BY MOUTH TWICE A DAY 180 tablet 2   pravastatin (PRAVACHOL) 40 MG tablet Take 40 mg by mouth daily.     sucralfate (CARAFATE) 1 g tablet Take 1 tablet (1 g total) by mouth every 6 (six) hours as needed. Please schedule a yearly follow up for further refills: [phone] (Patient not taking: Reported on 10/10/2021) 60 tablet 0   tamsulosin (FLOMAX) 0.4 MG CAPS capsule Take 0.4 mg by mouth daily.     No current facility-administered medications for this visit.   Facility-Administered Medications Ordered in Other Visits  Medication Dose Route Frequency Provider Last Rate Last Admin   dexamethasone (DECADRON)  10 mg in sodium chloride 0.9 % 50 mL IVPB  10 mg Intravenous Once [name], MD 204 mL/hr at 11/28/21 1047 10 mg at 11/28/21 1047   fluorouracil (ADRUCIL) 4,350 mg in sodium chloride 0.9 % 63 mL chemo infusion  2,000 mg/m2 (Treatment Plan Recorded) Intravenous 1 day or 1 dose [name], MD       irinotecan (CAMPTOSAR) 320 mg in sodium chloride 0.9 % 500 mL chemo infusion  150 mg/m2 (Treatment Plan Recorded) Intravenous Once [name], MD       leucovorin 872 mg in sodium chloride 0.9 % 250 mL infusion  400 mg/m2 (Treatment Plan Recorded) Intravenous Once [name], MD       palonosetron (ALOXI) injection 0.25 mg  0.25 mg Intravenous Once [name], MD       sodium chloride flush (NS) 0.9 % injection 10 mL  10 mL Intracatheter PRN [name], MD        PHYSICAL EXAMINATION: ECOG PERFORMANCE STATUS: 1 - Symptomatic but completely ambulatory  Vitals:   11/28/21 0932  BP: 134/62  Pulse: 60  Resp: 18  Temp: 98.1 F (36.7 C)  SpO2: 98%   Wt Readings from Last 3 Encounters:  11/28/21 218 lb 9.6 oz (99.2 kg)  11/16/21 222 lb 4.8 oz (100.8 kg)  11/01/21 216 lb (98 kg)     GENERAL:alert, no  distress and comfortable SKIN: skin color normal, no rashes or significant lesions EYES: normal, Conjunctiva are pink and non-injected, sclera clear  NEURO: alert & oriented x 3 with fluent speech  LABORATORY DATA:  I have reviewed the data as listed CBC Latest Ref Rng & Units 11/28/2021 11/16/2021 11/01/2021  WBC 4.0 - 10.5 K/uL 8.8 5.2 5.0  Hemoglobin 13.0 - 17.0 g/dL 10.2(L) 9.8(L) 11.0(L)  Hematocrit 39.0 - 52.0 % 32.0(L) 30.6(L) 33.2(L)  Platelets 150 - 400 K/uL 109(L) 84(L) 112(L)     CMP Latest Ref Rng & Units 11/28/2021 11/16/2021 11/01/2021  Glucose 70 - 99 mg/dL 148(H) 117(H) 110(H)  BUN 8 - 23 mg/dL 16 15 19   Creatinine 0.61 - 1.24 mg/dL 1.64(H) 1.33(H) 1.53(H)  Sodium 135 - 145 mmol/L 137 138 139  Potassium 3.5 - 5.1 mmol/L 3.9 3.8 4.1  Chloride 98 - 111 mmol/L 106 107 108  CO2 22 - 32 mmol/L 24 23 21(L)  Calcium 8.9 - 10.3 mg/dL 9.4 9.0 9.2  Total Protein 6.5 - 8.1 g/dL 6.8 6.6 7.4  Total Bilirubin 0.3 - 1.2 mg/dL 0.4 0.5 0.4  Alkaline Phos 38 - 126 U/L 114 87 91  AST 15 - 41 U/L 29 21 33  ALT 0 - 44 U/L 25 18 25       RADIOGRAPHIC STUDIES: I have personally reviewed the radiological images as listed and agreed with the findings in the report. No results found.    No orders of the defined types were placed in this encounter.  All questions were answered. The patient knows to call the clinic with any problems, questions or concerns. No barriers to learning was detected. The total time spent in the appointment was 30 minutes.     [name], MD 11/28/2021   I, Wilburn Mylar, am acting as scribe for [name], MD.   I have reviewed the above documentation for accuracy and completeness, and I agree with the above.

## 2021-11-28 NOTE — Progress Notes (Signed)
Ok to treat with creat 1.64 per Dr Burr Medico

## 2021-11-29 ENCOUNTER — Telehealth: Payer: Self-pay | Admitting: Hematology

## 2021-11-29 NOTE — Telephone Encounter (Signed)
Left message with follow-up appointments per 1/9 los. °

## 2021-11-30 ENCOUNTER — Other Ambulatory Visit: Payer: Self-pay

## 2021-11-30 ENCOUNTER — Inpatient Hospital Stay: Payer: Medicare HMO

## 2021-11-30 VITALS — BP 138/68 | HR 63 | Temp 98.1°F | Resp 18

## 2021-11-30 DIAGNOSIS — C221 Intrahepatic bile duct carcinoma: Secondary | ICD-10-CM

## 2021-11-30 DIAGNOSIS — Z5189 Encounter for other specified aftercare: Secondary | ICD-10-CM | POA: Diagnosis not present

## 2021-11-30 DIAGNOSIS — Z7189 Other specified counseling: Secondary | ICD-10-CM

## 2021-11-30 DIAGNOSIS — Z5111 Encounter for antineoplastic chemotherapy: Secondary | ICD-10-CM | POA: Diagnosis not present

## 2021-11-30 DIAGNOSIS — Z95828 Presence of other vascular implants and grafts: Secondary | ICD-10-CM

## 2021-11-30 MED ORDER — HEPARIN SOD (PORK) LOCK FLUSH 100 UNIT/ML IV SOLN
500.0000 [IU] | Freq: Once | INTRAVENOUS | Status: AC
  Administered 2021-11-30: 500 [IU]

## 2021-11-30 MED ORDER — PEGFILGRASTIM-CBQV 6 MG/0.6ML ~~LOC~~ SOSY
6.0000 mg | PREFILLED_SYRINGE | Freq: Once | SUBCUTANEOUS | Status: AC
  Administered 2021-11-30: 6 mg via SUBCUTANEOUS
  Filled 2021-11-30: qty 0.6

## 2021-11-30 MED ORDER — SODIUM CHLORIDE 0.9% FLUSH
10.0000 mL | Freq: Once | INTRAVENOUS | Status: AC
  Administered 2021-11-30: 10 mL

## 2021-12-09 DIAGNOSIS — N401 Enlarged prostate with lower urinary tract symptoms: Secondary | ICD-10-CM | POA: Diagnosis not present

## 2021-12-09 DIAGNOSIS — R972 Elevated prostate specific antigen [PSA]: Secondary | ICD-10-CM | POA: Diagnosis not present

## 2021-12-09 MED FILL — Dexamethasone Sodium Phosphate Inj 100 MG/10ML: INTRAMUSCULAR | Qty: 1 | Status: AC

## 2021-12-12 ENCOUNTER — Inpatient Hospital Stay: Payer: Medicare HMO | Admitting: Hematology

## 2021-12-12 ENCOUNTER — Other Ambulatory Visit: Payer: Self-pay

## 2021-12-12 ENCOUNTER — Inpatient Hospital Stay: Payer: Medicare HMO

## 2021-12-12 VITALS — BP 146/68 | HR 63 | Temp 98.5°F | Resp 17 | Wt 219.5 lb

## 2021-12-12 DIAGNOSIS — C221 Intrahepatic bile duct carcinoma: Secondary | ICD-10-CM

## 2021-12-12 DIAGNOSIS — Z7189 Other specified counseling: Secondary | ICD-10-CM

## 2021-12-12 DIAGNOSIS — Z5111 Encounter for antineoplastic chemotherapy: Secondary | ICD-10-CM | POA: Diagnosis not present

## 2021-12-12 DIAGNOSIS — Z95828 Presence of other vascular implants and grafts: Secondary | ICD-10-CM

## 2021-12-12 DIAGNOSIS — Z5189 Encounter for other specified aftercare: Secondary | ICD-10-CM | POA: Diagnosis not present

## 2021-12-12 LAB — CMP (CANCER CENTER ONLY)
ALT: 28 U/L (ref 0–44)
AST: 30 U/L (ref 15–41)
Albumin: 3.7 g/dL (ref 3.5–5.0)
Alkaline Phosphatase: 105 U/L (ref 38–126)
Anion gap: 7 (ref 5–15)
BUN: 16 mg/dL (ref 8–23)
CO2: 23 mmol/L (ref 22–32)
Calcium: 9.5 mg/dL (ref 8.9–10.3)
Chloride: 107 mmol/L (ref 98–111)
Creatinine: 1.51 mg/dL — ABNORMAL HIGH (ref 0.61–1.24)
GFR, Estimated: 48 mL/min — ABNORMAL LOW (ref >60)
Glucose, Bld: 174 mg/dL — ABNORMAL HIGH (ref 70–99)
Potassium: 4.3 mmol/L (ref 3.5–5.1)
Sodium: 137 mmol/L (ref 135–145)
Total Bilirubin: 0.4 mg/dL (ref 0.3–1.2)
Total Protein: 6.6 g/dL (ref 6.5–8.1)

## 2021-12-12 LAB — CBC WITH DIFFERENTIAL (CANCER CENTER ONLY)
Abs Immature Granulocytes: 0.22 10*3/uL — ABNORMAL HIGH (ref 0.00–0.07)
Basophils Absolute: 0.1 10*3/uL (ref 0.0–0.1)
Basophils Relative: 1 %
Eosinophils Absolute: 0.1 10*3/uL (ref 0.0–0.5)
Eosinophils Relative: 1 %
HCT: 31.1 % — ABNORMAL LOW (ref 39.0–52.0)
Hemoglobin: 9.9 g/dL — ABNORMAL LOW (ref 13.0–17.0)
Immature Granulocytes: 3 %
Lymphocytes Relative: 7 %
Lymphs Abs: 0.6 10*3/uL — ABNORMAL LOW (ref 0.7–4.0)
MCH: 32.2 pg (ref 26.0–34.0)
MCHC: 31.8 g/dL (ref 30.0–36.0)
MCV: 101.3 fL — ABNORMAL HIGH (ref 80.0–100.0)
Monocytes Absolute: 0.7 10*3/uL (ref 0.1–1.0)
Monocytes Relative: 9 %
Neutro Abs: 6 10*3/uL (ref 1.7–7.7)
Neutrophils Relative %: 79 %
Platelet Count: 110 10*3/uL — ABNORMAL LOW (ref 150–400)
RBC: 3.07 MIL/uL — ABNORMAL LOW (ref 4.22–5.81)
RDW: 16.8 % — ABNORMAL HIGH (ref 11.5–15.5)
WBC Count: 7.5 10*3/uL (ref 4.0–10.5)
nRBC: 0.5 % — ABNORMAL HIGH (ref 0.0–0.2)

## 2021-12-12 MED ORDER — SODIUM CHLORIDE 0.9 % IV SOLN
400.0000 mg/m2 | Freq: Once | INTRAVENOUS | Status: AC
  Administered 2021-12-12: 872 mg via INTRAVENOUS
  Filled 2021-12-12: qty 43.6

## 2021-12-12 MED ORDER — SODIUM CHLORIDE 0.9 % IV SOLN
130.0000 mg/m2 | Freq: Once | INTRAVENOUS | Status: AC
  Administered 2021-12-12: 280 mg via INTRAVENOUS
  Filled 2021-12-12: qty 14

## 2021-12-12 MED ORDER — SODIUM CHLORIDE 0.9% FLUSH
10.0000 mL | Freq: Once | INTRAVENOUS | Status: AC
  Administered 2021-12-12: 10 mL

## 2021-12-12 MED ORDER — SODIUM CHLORIDE 0.9 % IV SOLN
2000.0000 mg/m2 | INTRAVENOUS | Status: DC
  Administered 2021-12-12: 4350 mg via INTRAVENOUS
  Filled 2021-12-12: qty 87

## 2021-12-12 MED ORDER — SODIUM CHLORIDE 0.9 % IV SOLN
10.0000 mg | Freq: Once | INTRAVENOUS | Status: AC
  Administered 2021-12-12: 10 mg via INTRAVENOUS
  Filled 2021-12-12: qty 10

## 2021-12-12 MED ORDER — SODIUM CHLORIDE 0.9 % IV SOLN: Freq: Once | INTRAVENOUS | Status: AC

## 2021-12-12 MED ORDER — PALONOSETRON HCL INJECTION 0.25 MG/5ML
0.2500 mg | Freq: Once | INTRAVENOUS | Status: AC
  Administered 2021-12-12: 0.25 mg via INTRAVENOUS
  Filled 2021-12-12: qty 5

## 2021-12-12 NOTE — Patient Instructions (Signed)
[facility] ONCOLOGY  Discharge Instructions: Thank you for choosing [facility] to provide your oncology and hematology care.   If you have a lab appointment with the [facility], please go directly to the [facility] and check in at the registration area.   Wear comfortable clothing and clothing appropriate for easy access to any Portacath or PICC line.   We strive to give you quality time with your provider. You may need to reschedule your appointment if you arrive late (15 or more minutes).  Arriving late affects you and other patients whose appointments are after yours.  Also, if you miss three or more appointments without notifying the office, you may be dismissed from the clinic at the providers discretion.      For prescription refill requests, have your pharmacy contact our office and allow 72 hours for refills to be completed.    Today you received the following chemotherapy and/or immunotherapy agents irinotecan, leucovorin, fluorourcil      To help prevent nausea and vomiting after your treatment, we encourage you to take your nausea medication as directed.  BELOW ARE SYMPTOMS THAT SHOULD BE REPORTED IMMEDIATELY: *FEVER GREATER THAN 100.4 F (38 C) OR HIGHER *CHILLS OR SWEATING *NAUSEA AND VOMITING THAT IS NOT CONTROLLED WITH YOUR NAUSEA MEDICATION *UNUSUAL SHORTNESS OF BREATH *UNUSUAL BRUISING OR BLEEDING *URINARY PROBLEMS (pain or burning when urinating, or frequent urination) *BOWEL PROBLEMS (unusual diarrhea, constipation, pain near the anus) TENDERNESS IN MOUTH AND THROAT WITH OR WITHOUT PRESENCE OF ULCERS (sore throat, sores in mouth, or a toothache) UNUSUAL RASH, SWELLING OR PAIN  UNUSUAL VAGINAL DISCHARGE OR ITCHING   Items with * indicate a potential emergency and should be followed up as soon as possible or go to the Emergency Department if any problems should occur.  Please show the CHEMOTHERAPY ALERT CARD or IMMUNOTHERAPY  ALERT CARD at check-in to the Emergency Department and triage nurse.  Should you have questions after your visit or need to cancel or reschedule your appointment, please contact [facility]  Dept: [phone]  and follow the prompts.  Office hours are 8:00 a.m. to 4:30 p.m. Monday - Friday. Please note that voicemails left after 4:00 p.m. may not be returned until the following business day.  We are closed weekends and major holidays. You have access to a nurse at all times for urgent questions. Please call the main number to the clinic Dept: [phone] and follow the prompts.   For any non-urgent questions, you may also contact your provider using MyChart. We now offer e-Visits for anyone 27 and older to request care online for non-urgent symptoms. For details visit [email or web address].   Also download the MyChart app! Go to the app store, search "MyChart", open the app, select [facility], and log in with your MyChart username and password.  Due to Covid, a mask is required upon entering the hospital/clinic. If you do not have a mask, one will be given to you upon arrival. For doctor visits, patients may have 1 support person aged 93 or older with them. For treatment visits, patients cannot have anyone with them due to current Covid guidelines and our immunocompromised population.

## 2021-12-12 NOTE — Progress Notes (Signed)
[name]   [phone] Fax:([phone]) [phone]   Clinic Follow up Note   Patient Care Team: [name], PA as PCP - General (Family Medicine) Lorretta Harp, MD as PCP - Cardiology (Cardiology) [name], MD as Consulting Physician (General Surgery) [name], [name], MD as Consulting Physician (Gastroenterology) [name], MD as Consulting Physician (Hematology) Lorretta Harp, MD as Consulting Physician (Cardiology)  Date of Service:  12/12/2021  CHIEF COMPLAINT: f/u of cholangiocarcinoma   CURRENT THERAPY:  FOLFIRI q14d, started on 11/01/21  ASSESSMENT & PLAN:  Brandon Sherman is a 76 y.o. male with   1. Intrahepatic cholangiocarcinoma, cT2N0Mx, unresectable, with indeterminate lung nodules  -Diagnosed in 07/2019. CT scans and MRI liver show a large 7.3cm mass in the right hepatic lobe which abuts portal vein. Both our local surgeon Dr. Barry Dienes and Dr Carlis Abbott at [facility] concluded that cancer is not resectable, and therefore not likely curable, due to the invasion to portal vein. -He began palliative systemic treatment standard first line chemo with IV Cisplatin and Gemcitabine 2 weeks on/1 week off. Started 09/29/19, tolerating well cisplatin was held with cycle 9 on 03/23/20 due to fluid status/A. fib, now on maintenance gemcitabine alone -His FO results showed MSI stable disease, IDH1 mutations he may benefit from IDH inhibitor in the future -he went to single agent gemcitabine q2 weeks on 12/24/20 until disease progression in liver on 03/16/21 MRI with concern for new liver lesion  -Started second line FOLFOX q2 weeks plus durvalumab q4 weeks starting 04/05/21. Dose reduced from cycle 8 due to cytopenias -Restaging CT chest and MRI abdomen 10/21/21 showed progression in the lungs and liver. -changed to third line FOLFIRI started on 11/01/21, dose reduced for cytopenias. -Due to ongoing cytopenias, further dose reduced 5FU from 2000 mg/m2 to 1800 mg/m2  and irinotecan from 150 mg/m2 to 110 mg/m2 starting today with Cycle 2, and slightly increased back some on cycle 3 -he is tolerating FOLFIRI well overall. Labs reviewed, no concern, adequate to proceed with treatment today. -repeat scan after cycle 5 or 6    2. Neuropathy G1 -he has mild numbness to his fingertips and toes, does not affect his functionality. -will monitor   3. Afib, CHF -diagnosed after PAC placement on 02/23/20. -CHADS2 score 2, moderate risk for stroke. Started on metoprolol and eliquis on 03/01/20. Tolerating well without bleeding.  -He had cardioversion on 05/19/20 -continue f/u with cardiology, Dr. Gwenlyn Found    5. DM, HTN, HLD, Gout -On Metformin, amlodipine, lisinopril, lasix, allopurinol -Continue to f/u with his PCP  -I previously reduced lasix to 1 tab BID rather than 2 tabs BID -monitoring BP and BG   6. Nicotine Use -He never smoked but has been chewing Tobacco for the past 60 years. He no longer drinks alcohol and tells me he has quit smoking (11/25/19).    7. GERD and gastritis, history of esophageal candidiasis -He had repeated EGD with Dr. Havery Moros in 07/2019. 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 May repeat EGD in future  -GERD resolved on PPI and carafate     Plan: -proceed with C4 FOLFIRI at same dose  -Udenyca on day 3  -lab, flush, f/u, and FOLFIRI in 2, 4, and 6 weeks   No problem-specific Assessment & Plan notes found for this encounter.   SUMMARY OF ONCOLOGIC HISTORY: Oncology History Overview Note  Cancer Staging Intrahepatic cholangiocarcinoma ([place]) Staging  form: Intrahepatic Bile Duct, AJCC 8th Edition - Clinical stage from 08/19/2019: Stage II (cT2, cN0, cM0) - Signed by [name], MD on 08/19/2019    Intrahepatic cholangiocarcinoma ([place])  06/28/2019 Imaging   CT AP W Contrast 06/28/19  IMPRESSION: 1. Heterogeneous hypodensity posteriorly in  the right hepatic lobe and potentially extending into the caudate lobe suspicious for a mass. There is felt to be truncation of branches of the portal vein in this vicinity and some narrowing of the hepatic vein, as well as triangular-shaped regions of abnormal hypoenhancement posteriorly in the right hepatic lobe likely representing downstream vascular effects. Cannot exclude malignancy such as cholangiocarcinoma or hepatocellular carcinoma, and follow up hepatic protocol MRI with and without contrast is recommended to further characterize. 2. 4 mm right middle lobe pulmonary nodule is likely benign but may merit surveillance. 3. Cholelithiasis. 4.  Aortic Atherosclerosis (ICD10-I70.0). 5. Prostatomegaly. 6. Mild impingement at L3-4 and L4-5.   07/31/2019 Imaging   MRI Liver 07/31/19 IMPRESSION: 1. 7.3 cm in long axis mass in the right hepatic lobe spanning into the caudate lobe, high suspicion for malignancy such as hepatocellular carcinoma or cholangiocarcinoma. Suspected effacement or occlusion of the right hepatic vein and posterior branches of the right portal vein. Two smaller tumor nodules along the posterior periphery of the dominant mass. Tissue diagnosis is recommended. 2. No findings of pathologic adenopathy or distant metastatic spread. 3. 9 mm gallstone in the gallbladder. There is mild gallbladder wall thickening which may be from nondistention, correlate clinically in assessing for cholecystitis. 4.  Aortic Atherosclerosis (ICD10-I70.0). 5. Mild diffuse hepatic steatosis.   08/11/2019 Initial Biopsy   DIAGNOSIS: 08/11/19  A. LIVER, RIGHT, BIOPSY:  - Adenocarcinoma.   08/18/2019 Imaging   CT Chest 08/18/19  IMPRESSION: 1. Multiple pulmonary nodules largest at approximately 7 mm in the right lower lobe, nonspecific but concerning given findings in the liver. 2. No signs of definitive metastatic disease, also with mildly enlarged upper abdominal lymph nodes as  discussed.   Aortic Atherosclerosis (ICD10-I70.0).   08/19/2019 Initial Diagnosis   Intrahepatic cholangiocarcinoma ([place])   08/19/2019 Cancer Staging   Staging form: Intrahepatic Bile Duct, AJCC 8th Edition - Clinical stage from 08/19/2019: Stage II (cT2, cN0, cM0) - Signed by [name], MD on 08/19/2019    09/29/2019 - 07/19/2020 Chemotherapy   Cisplatin and Gemcitabine 2 weeks on/1 week off starting 09/29/19. Cisplatin held from cycle 9 (03/23/20) due to fluid status/Afib. He is now on maintenance Gemcitabine. Held after 07/19/20 to proceed with liver target therapy.     10/09/2019 Imaging   CT AP IMPRESSION: 1. The dominant right hepatic lobe mass is minimally reduced in size compared to prior exams, currently measuring 6.2 by 5.3 cm, previously 6.2 by 5.6 cm. However, there is a new small hypodense lesion centrally in the right hepatic lobe which is suspicious for a new small focus of tumor. Accordingly this is an overall mixed appearance. 2. Continued hypoenhancement in the liver downstream of the tumor likely attributable to narrowing or occlusion of the right hepatic vein by the tumor. By virtue of its location the tumor wraps around the intrahepatic portion of the IVC. 3. 4 mm right middle lobe pulmonary nodule, stable compared to earliest available comparison of 07/18/2019. Surveillance of the patient's pulmonary nodules is recommended. 4. Other imaging findings of potential clinical significance: Coronary atherosclerosis. Cholelithiasis. Prominent stool throughout the colon favors constipation. Moderate prostatomegaly with heterogeneous enhancement of the prostate gland. Lumbar spondylosis and degenerative disc disease causing  mild bilateral foraminal impingement at L3-4 and L4-5.   Aortic Atherosclerosis (ICD10-I70.0).   12/24/2019 Imaging   CT CAP W Contrast  IMPRESSION: 1. Right hepatic lobe mass and adjacent right hepatic lobe nodules appear grossly stable. No evidence of distant  metastatic disease. 2. Continued stability of small pulmonary nodules. Recommend attention on follow-up. 3. Cholelithiasis. 4. Enlarged prostate. 5. Aortic atherosclerosis (ICD10-I70.0). Coronary artery calcification.   02/23/2020 Procedure   He had PAC placed on 02/23/20.    04/08/2020 Imaging   CT CAP  IMPRESSION: 1. Stable or minimally decreased size of a very ill-defined, hypodense and somewhat retractile appearing mass of the central right lobe of the liver abutting the inferior vena cava and right portal vein, measuring approximately 5.7 x 5.0 cm, previously 6.2 x 5.2 cm when measured similarly. Findings are consistent with stable or minimally improved cholangiocarcinoma. 2. Small hypodense nodules of the right lobe identified on prior examination are poorly appreciated on this single phase contrast examination although not grossly changed. Attention on follow-up. 3. There are new, moderate bilateral pleural effusions and associated atelectasis or consolidation as well as a new small pericardial effusion, nonspecific although generally concerning and suspicious for malignant effusions. There is no directly visualized pleural nodularity. 4. Multiple small pulmonary nodules are stable.  No new nodules. 5. Coronary artery disease. Aortic Atherosclerosis (ICD10-I70.0).   07/06/2020 Imaging   MRI ABD IMPRESSION: 1. Interval decrease in size of the right hepatic lobe lesion in the medial aspect of the segment 7. Findings would suggest a good response to treatment with some contraction of the tumor. 2. No new hepatic lesions. No abdominal adenopathy or metastatic disease. 3. Stable mild intrahepatic biliary dilatation in the right hepatic lobe distal to the lesion. 4. Somewhat tortuous and almost beaded appearance of the hepatic and portal vein radicles. Findings could be radiation related. 5. Cholelithiasis.  CT chest wo contrast IMPRESSION: 1. Persistent/stable moderate-sized  bilateral pleural effusions with overlying atelectasis. 2. Stable small right pulmonary nodules. No new or progressive findings. Recommend continued surveillance. 3. No mediastinal or hilar mass or adenopathy. 4. Stable advanced three-vessel coronary artery calcifications. 5. Cholelithiasis. Aortic Atherosclerosis (ICD10-I70.0)   08/11/2020 Procedure   Y90 on 08/11/20 and 08/26/20 with Dr Laurence Ferrari    11/30/2020 Imaging   MRI Abdomen  IMPRESSION: 1. Today's study demonstrates progression of disease with enlarging central lesion in the right lobe of the liver involving portions of segments 7 and 8, now with evidence of some tumor thrombus extension into the intrahepatic portion of the inferior vena cava. This is associated with increasing intrahepatic biliary ductal dilatation in segment 7 of the liver. 2. In addition, there is some subtle hyperenhancement of the distal common bile duct at and immediately proximally to the level of the ampulla. There is also some subtle delayed enhancement around this region in the pancreatic head. This is of uncertain etiology and significance, but may be inflammatory and currently is not associated with proximal common bile duct dilatation. However, close attention on follow-up imaging is recommended. 3. Cholelithiasis without evidence of acute cholecystitis at this time. 4. Aortic atherosclerosis.     12/24/2020 - 03/21/2021 Chemotherapy   Restart Gemcitabine 2 weeks on/1 week off given disease progression beginning 12/24/20. Discontinued after 03/21/21 due to disease progression in liver.     03/16/2021 Imaging   MRI abdomen  IMPRESSION: No significant change in size of irregular hypovascular mass in the medial right hepatic lobe.   New 1.1 cm hypovascular lesion with peripheral  rim enhancement in liver dome, suspicious for metastatic disease.   New small right pleural effusion and mild ascites.   Cholelithiasis, without evidence of cholecystitis or  biliary dilatation.   04/05/2021 -  Chemotherapy   Second-line FOLFOX q2weeks starting 04/05/21    04/05/2021 -  Chemotherapy   Immunotherapy with Durvalumab (Imfinzi) q4weeks starting 04/05/21. (in addition to second line FOLFOX)   06/23/2021 Imaging   MRI Abdomen IMPRESSION: 1. No significant change in subcapsular mass of the posterior right lobe of the liver. 2. No significant change in an additional small hyperenhancing lesion of the liver dome, which remains suspicious for a satellite lesion. 3. No new liver lesions. 4. 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 5. Cholelithiasis without evidence of acute cholecystitis. 6. Trace ascites.  CT Chest w/o contrast IMPRESSION: 1. Occasional small pulmonary nodules in the right lung are stable, for example a 6 mm nodule right lower lobe and a 3 mm nodule of the lateral segment right middle lobe. These are nonspecific and likely benign, incidental sequelae of infection or inflammation, metastatic disease not favored. Attention on follow-up. 2. Previously seen bilateral pleural effusions are resolved. 3. Hypodense lesion of the posterior right lobe of the liver, keeping with known cholangiocarcinoma and better characterized by same day MR. 4. Coronary artery disease.   10/21/2021 Imaging   EXAM: MRI ABDOMEN WITHOUT AND WITH CONTRAST  IMPRESSION: 1. Mild progression of dominant right hepatic lobe mass with involvement of the right portal, hepatic veins and minimal extension into the IVC. 2. Hepatic dome satellite lesion, new or more conspicuous today. 3. Cholelithiasis 4. Decrease in trace perihepatic and perisplenic ascites. 5.  Aortic Atherosclerosis (ICD10-I70.0).   10/21/2021 Imaging   EXAM: CT CHEST WITHOUT CONTRAST  IMPRESSION: 1. Since 06/23/2021, enlargement of a right lower lobe and possible enlargement of a right middle lobe pulmonary nodule. Findings are suspicious for metastatic disease. 2. No  thoracic adenopathy. 3. Coronary artery atherosclerosis. Aortic Atherosclerosis (ICD10-I70.0).   11/01/2021 -  Chemotherapy   Patient is on Treatment Plan : PANCREAS FOLFIRI q14d        INTERVAL HISTORY:  NECO KLING is here for a follow up of cholangiocarcinoma. He was last seen by me on 11/28/21. He presents to the clinic accompanied by his wife. He reports numbness to his fingertips. He notes his energy level is about the same.   All other systems were reviewed with the patient and are negative.  MEDICAL HISTORY:  Past Medical History:  Diagnosis Date   Arthritis    Diabetes ([place])    GERD (gastroesophageal reflux disease)    Hyperlipidemia    Hypertension    Intrahepatic cholangiocarcinoma ([place])     SURGICAL HISTORY: Past Surgical History:  Procedure Laterality Date   APPENDECTOMY  1980   CARDIOVERSION N/A 04/27/2020   Procedure: CARDIOVERSION;  Surgeon: Dorothy Spark, MD;  Location: [facility] ENDOSCOPY;  Service: Cardiovascular;  Laterality: N/A;   CARDIOVERSION N/A 05/19/2020   Procedure: CARDIOVERSION;  Surgeon: Elouise Munroe, MD;  Location: [facility];  Service: Cardiovascular;  Laterality: N/A;   COLONOSCOPY     IR 3D INDEPENDENT WKST  08/11/2020   IR 3D INDEPENDENT WKST  08/11/2020   IR ANGIOGRAM SELECTIVE EACH ADDITIONAL VESSEL  08/11/2020   IR ANGIOGRAM SELECTIVE EACH ADDITIONAL VESSEL  08/11/2020   IR ANGIOGRAM SELECTIVE EACH ADDITIONAL VESSEL  08/11/2020   IR ANGIOGRAM SELECTIVE EACH ADDITIONAL VESSEL  08/11/2020   IR ANGIOGRAM SELECTIVE EACH ADDITIONAL VESSEL  08/11/2020  IR ANGIOGRAM SELECTIVE EACH ADDITIONAL VESSEL  08/11/2020   IR ANGIOGRAM SELECTIVE EACH ADDITIONAL VESSEL  08/26/2020   IR ANGIOGRAM SELECTIVE EACH ADDITIONAL VESSEL  08/26/2020   IR ANGIOGRAM VISCERAL SELECTIVE  08/11/2020   IR ANGIOGRAM VISCERAL SELECTIVE  08/26/2020   IR EMBO ARTERIAL NOT HEMORR HEMANG INC GUIDE ROADMAPPING  08/11/2020   IR EMBO TUMOR ORGAN ISCHEMIA INFARCT INC GUIDE  ROADMAPPING  08/26/2020   IR IMAGING GUIDED PORT INSERTION  02/23/2020   IR RADIOLOGIST EVAL & MGMT  07/14/2020   IR RADIOLOGIST EVAL & MGMT  09/30/2020   IR RADIOLOGIST EVAL & MGMT  12/01/2020   IR US GUIDE VASC ACCESS LEFT  08/26/2020   IR US GUIDE VASC ACCESS RIGHT  08/11/2020    I have reviewed the social history and family history with the patient and they are unchanged from previous note.  ALLERGIES:  has No Known Allergies.  MEDICATIONS:  Current Outpatient Medications  Medication Sig Dispense Refill   allopurinol (ZYLOPRIM) 100 MG tablet Take 100 mg by mouth daily.     colchicine 0.6 MG tablet Take 1 tablet (0.6 mg total) by mouth daily as needed (gout flare). Take daily for 3 days, then as needed for gout flare 30 tablet 0   diltiazem (CARDIZEM CD) 240 MG 24 hr capsule TAKE 1 CAPSULE BY MOUTH EVERY DAY, STOP AMLODIPINE 90 capsule 3   ELIQUIS 5 MG TABS tablet TAKE 1 TABLET TWICE DAILY 180 tablet 1   fenofibrate (TRICOR) 145 MG tablet Take 145 mg by mouth daily.     finasteride (PROSCAR) 5 MG tablet Take 5 mg by mouth daily.     furosemide (LASIX) 40 MG tablet TAKE 2 TABLETS (80 MG TOTAL) BY MOUTH 2 (TWO) TIMES DAILY. 360 tablet 3   gabapentin (NEURONTIN) 100 MG capsule Take 100 mg by mouth at bedtime.     hydrALAZINE (APRESOLINE) 50 MG tablet Take 1 tablet (50 mg total) by mouth 2 (two) times daily. 180 tablet 2   KLOR-CON M20 20 MEQ tablet TAKE 1 TABLET BY MOUTH TWICE A DAY 180 tablet 3   lidocaine-prilocaine (EMLA) cream Apply 1 application topically as needed. 30 g 3   magnesium oxide (MAG-OX) 400 (240 Mg) MG tablet TAKE 1 TABLET BY MOUTH EVERY DAY 30 tablet 1   metFORMIN (GLUCOPHAGE-XR) 500 MG 24 hr tablet      metoprolol succinate (TOPROL XL) 25 MG 24 hr tablet Take 1 tablet (25 mg total) by mouth daily. 90 tablet 3   metoprolol tartrate (LOPRESSOR) 50 MG tablet      ondansetron (ZOFRAN) 8 MG tablet TAKE 1 TABLET BY MOUTH TWICE A DAY AS NEEDED START ON 3RD DAY AFTER CHEMOTHERAPY  (Patient not taking: Reported on 11/16/2021) 30 tablet 1   pantoprazole (PROTONIX) 40 MG tablet TAKE 1 TABLET BY MOUTH TWICE A DAY 180 tablet 2   pravastatin (PRAVACHOL) 40 MG tablet Take 40 mg by mouth daily.     sucralfate (CARAFATE) 1 g tablet Take 1 tablet (1 g total) by mouth every 6 (six) hours as needed. Please schedule a yearly follow up for further refills: [phone] (Patient not taking: Reported on 10/10/2021) 60 tablet 0   tamsulosin (FLOMAX) 0.4 MG CAPS capsule Take 0.4 mg by mouth daily.     No current facility-administered medications for this visit.    PHYSICAL EXAMINATION: ECOG PERFORMANCE STATUS: 1 - Symptomatic but completely ambulatory  Vitals:   12/12/21 1138  BP: (!) 146/68  Pulse: 63  Resp:  17  Temp: 98.5 F (36.9 C)  SpO2: 98%   Wt Readings from Last 3 Encounters:  12/12/21 219 lb 8 oz (99.6 kg)  11/28/21 218 lb 9.6 oz (99.2 kg)  11/16/21 222 lb 4.8 oz (100.8 kg)     GENERAL:alert, no distress and comfortable SKIN: skin color, texture, turgor are normal, no rashes or significant lesions EYES: normal, Conjunctiva are pink and non-injected, sclera clear  LUNGS: clear to auscultation and percussion with normal breathing effort HEART: regular rate & rhythm and no murmurs and no lower extremity edema NEURO: alert & oriented x 3 with fluent speech, no focal motor/sensory deficits  LABORATORY DATA:  I have reviewed the data as listed CBC Latest Ref Rng & Units 12/12/2021 11/28/2021 11/16/2021  WBC 4.0 - 10.5 K/uL 7.5 8.8 5.2  Hemoglobin 13.0 - 17.0 g/dL 9.9(L) 10.2(L) 9.8(L)  Hematocrit 39.0 - 52.0 % 31.1(L) 32.0(L) 30.6(L)  Platelets 150 - 400 K/uL 110(L) 109(L) 84(L)     CMP Latest Ref Rng & Units 12/12/2021 11/28/2021 11/16/2021  Glucose 70 - 99 mg/dL 174(H) 148(H) 117(H)  BUN 8 - 23 mg/dL _0 Creatinine 0.61 - 1.24 mg/dL 1.51(H) 1.64(H) 1.33(H)  Sodium 135 - 145 mmol/L 137 137 138  Potassium 3.5 - 5.1 mmol/L 4.3 3.9 3.8  Chloride 98 - 111  mmol/L 107 106 107  CO2 22 - 32 mmol/L _1 Calcium 8.9 - 10.3 mg/dL 9.5 9.4 9.0  Total Protein 6.5 - 8.1 g/dL 6.6 6.8 6.6  Total Bilirubin 0.3 - 1.2 mg/dL 0.4 0.4 0.5  Alkaline Phos 38 - 126 U/L 105 114 87  AST 15 - 41 U/L _2 ALT 0 - 44 U/L _3 RADIOGRAPHIC STUDIES: I have personally reviewed the radiological images as listed and agreed with the findings in the report. No results found.    No orders of the defined types were placed in this encounter.  All questions were answered. The patient knows to call the clinic with any problems, questions or concerns. No barriers to learning was detected. The total time spent in the appointment was 30 minutes.     [name], MD 12/12/2021   I, Wilburn Mylar, am acting as scribe for [name], MD.   I have reviewed the above documentation for accuracy and completeness, and I agree with the above.

## 2021-12-13 ENCOUNTER — Encounter: Payer: Self-pay | Admitting: Hematology

## 2021-12-14 ENCOUNTER — Other Ambulatory Visit: Payer: Self-pay

## 2021-12-14 ENCOUNTER — Inpatient Hospital Stay: Payer: Medicare HMO

## 2021-12-14 VITALS — BP 125/57 | HR 63 | Temp 98.6°F | Resp 20

## 2021-12-14 DIAGNOSIS — Z5189 Encounter for other specified aftercare: Secondary | ICD-10-CM | POA: Diagnosis not present

## 2021-12-14 DIAGNOSIS — Z5111 Encounter for antineoplastic chemotherapy: Secondary | ICD-10-CM | POA: Diagnosis not present

## 2021-12-14 DIAGNOSIS — Z7189 Other specified counseling: Secondary | ICD-10-CM

## 2021-12-14 DIAGNOSIS — C221 Intrahepatic bile duct carcinoma: Secondary | ICD-10-CM

## 2021-12-14 MED ORDER — HEPARIN SOD (PORK) LOCK FLUSH 100 UNIT/ML IV SOLN
500.0000 [IU] | Freq: Once | INTRAVENOUS | Status: AC | PRN
  Administered 2021-12-14: 14:00:00 500 [IU]

## 2021-12-14 MED ORDER — SODIUM CHLORIDE 0.9% FLUSH
10.0000 mL | INTRAVENOUS | Status: DC | PRN
  Administered 2021-12-14: 14:00:00 10 mL

## 2021-12-14 MED ORDER — PEGFILGRASTIM-CBQV 6 MG/0.6ML ~~LOC~~ SOSY
6.0000 mg | PREFILLED_SYRINGE | Freq: Once | SUBCUTANEOUS | Status: AC
  Administered 2021-12-14: 14:00:00 6 mg via SUBCUTANEOUS
  Filled 2021-12-14: qty 0.6

## 2021-12-23 MED FILL — Dexamethasone Sodium Phosphate Inj 100 MG/10ML: INTRAMUSCULAR | Qty: 1 | Status: AC

## 2021-12-26 ENCOUNTER — Encounter: Payer: Self-pay | Admitting: Hematology

## 2021-12-26 ENCOUNTER — Inpatient Hospital Stay: Payer: Medicare HMO

## 2021-12-26 ENCOUNTER — Inpatient Hospital Stay: Payer: Medicare HMO | Admitting: Hematology

## 2021-12-26 ENCOUNTER — Inpatient Hospital Stay: Payer: Medicare HMO | Attending: Hematology

## 2021-12-26 ENCOUNTER — Other Ambulatory Visit: Payer: Self-pay

## 2021-12-26 VITALS — BP 136/66 | HR 59 | Temp 98.4°F | Resp 17 | Ht 69.0 in | Wt 222.2 lb

## 2021-12-26 DIAGNOSIS — C221 Intrahepatic bile duct carcinoma: Secondary | ICD-10-CM | POA: Diagnosis not present

## 2021-12-26 DIAGNOSIS — Z5111 Encounter for antineoplastic chemotherapy: Secondary | ICD-10-CM | POA: Insufficient documentation

## 2021-12-26 DIAGNOSIS — Z5189 Encounter for other specified aftercare: Secondary | ICD-10-CM | POA: Diagnosis not present

## 2021-12-26 DIAGNOSIS — Z95828 Presence of other vascular implants and grafts: Secondary | ICD-10-CM

## 2021-12-26 DIAGNOSIS — Z7189 Other specified counseling: Secondary | ICD-10-CM

## 2021-12-26 LAB — CBC WITH DIFFERENTIAL (CANCER CENTER ONLY)
Abs Immature Granulocytes: 0.11 10*3/uL — ABNORMAL HIGH (ref 0.00–0.07)
Basophils Absolute: 0 10*3/uL (ref 0.0–0.1)
Basophils Relative: 0 %
Eosinophils Absolute: 0.1 10*3/uL (ref 0.0–0.5)
Eosinophils Relative: 1 %
HCT: 31.6 % — ABNORMAL LOW (ref 39.0–52.0)
Hemoglobin: 10 g/dL — ABNORMAL LOW (ref 13.0–17.0)
Immature Granulocytes: 2 %
Lymphocytes Relative: 9 %
Lymphs Abs: 0.6 10*3/uL — ABNORMAL LOW (ref 0.7–4.0)
MCH: 32.1 pg (ref 26.0–34.0)
MCHC: 31.6 g/dL (ref 30.0–36.0)
MCV: 101.3 fL — ABNORMAL HIGH (ref 80.0–100.0)
Monocytes Absolute: 0.7 10*3/uL (ref 0.1–1.0)
Monocytes Relative: 10 %
Neutro Abs: 5.3 10*3/uL (ref 1.7–7.7)
Neutrophils Relative %: 78 %
Platelet Count: 109 10*3/uL — ABNORMAL LOW (ref 150–400)
RBC: 3.12 MIL/uL — ABNORMAL LOW (ref 4.22–5.81)
RDW: 17.2 % — ABNORMAL HIGH (ref 11.5–15.5)
WBC Count: 6.9 10*3/uL (ref 4.0–10.5)
nRBC: 0 % (ref 0.0–0.2)

## 2021-12-26 LAB — CMP (CANCER CENTER ONLY)
ALT: 31 U/L (ref 0–44)
AST: 28 U/L (ref 15–41)
Albumin: 3.8 g/dL (ref 3.5–5.0)
Alkaline Phosphatase: 107 U/L (ref 38–126)
Anion gap: 6 (ref 5–15)
BUN: 18 mg/dL (ref 8–23)
CO2: 24 mmol/L (ref 22–32)
Calcium: 9.4 mg/dL (ref 8.9–10.3)
Chloride: 107 mmol/L (ref 98–111)
Creatinine: 1.45 mg/dL — ABNORMAL HIGH (ref 0.61–1.24)
GFR, Estimated: 51 mL/min — ABNORMAL LOW (ref >60)
Glucose, Bld: 172 mg/dL — ABNORMAL HIGH (ref 70–99)
Potassium: 4.2 mmol/L (ref 3.5–5.1)
Sodium: 137 mmol/L (ref 135–145)
Total Bilirubin: 0.4 mg/dL (ref 0.3–1.2)
Total Protein: 6.6 g/dL (ref 6.5–8.1)

## 2021-12-26 LAB — MAGNESIUM: Magnesium: 1.8 mg/dL (ref 1.7–2.4)

## 2021-12-26 MED ORDER — SODIUM CHLORIDE 0.9% FLUSH
10.0000 mL | Freq: Once | INTRAVENOUS | Status: AC
  Administered 2021-12-26: 10 mL

## 2021-12-26 MED ORDER — SODIUM CHLORIDE 0.9 % IV SOLN
130.0000 mg/m2 | Freq: Once | INTRAVENOUS | Status: AC
  Administered 2021-12-26: 280 mg via INTRAVENOUS
  Filled 2021-12-26: qty 14

## 2021-12-26 MED ORDER — SODIUM CHLORIDE 0.9 % IV SOLN
400.0000 mg/m2 | Freq: Once | INTRAVENOUS | Status: AC
  Administered 2021-12-26: 872 mg via INTRAVENOUS
  Filled 2021-12-26: qty 25

## 2021-12-26 MED ORDER — SODIUM CHLORIDE 0.9 % IV SOLN: Freq: Once | INTRAVENOUS | Status: AC

## 2021-12-26 MED ORDER — PALONOSETRON HCL INJECTION 0.25 MG/5ML
0.2500 mg | Freq: Once | INTRAVENOUS | Status: AC
  Administered 2021-12-26: 0.25 mg via INTRAVENOUS
  Filled 2021-12-26: qty 5

## 2021-12-26 MED ORDER — SODIUM CHLORIDE 0.9 % IV SOLN
2000.0000 mg/m2 | INTRAVENOUS | Status: DC
  Administered 2021-12-26: 4350 mg via INTRAVENOUS
  Filled 2021-12-26: qty 87

## 2021-12-26 MED ORDER — SODIUM CHLORIDE 0.9 % IV SOLN
10.0000 mg | Freq: Once | INTRAVENOUS | Status: AC
  Administered 2021-12-26: 10 mg via INTRAVENOUS
  Filled 2021-12-26: qty 10

## 2021-12-26 NOTE — Patient Instructions (Signed)
[facility]  Discharge Instructions: Thank you for choosing [facility] to provide your oncology and hematology care.   If you have a lab appointment with the [facility], please go directly to the [facility] and check in at the registration area.   Wear comfortable clothing and clothing appropriate for easy access to any Portacath or PICC line.   We strive to give you quality time with your provider. You may need to reschedule your appointment if you arrive late (15 or more minutes).  Arriving late affects you and other patients whose appointments are after yours.  Also, if you miss three or more appointments without notifying the office, you may be dismissed from the clinic at the providers discretion.      For prescription refill requests, have your pharmacy contact our office and allow 72 hours for refills to be completed.    Today you received the following chemotherapy and/or immunotherapy agents: irinotecan and fluorouracil       To help prevent nausea and vomiting after your treatment, we encourage you to take your nausea medication as directed.  BELOW ARE SYMPTOMS THAT SHOULD BE REPORTED IMMEDIATELY: *FEVER GREATER THAN 100.4 F (38 C) OR HIGHER *CHILLS OR SWEATING *NAUSEA AND VOMITING THAT IS NOT CONTROLLED WITH YOUR NAUSEA MEDICATION *UNUSUAL SHORTNESS OF BREATH *UNUSUAL BRUISING OR BLEEDING *URINARY PROBLEMS (pain or burning when urinating, or frequent urination) *BOWEL PROBLEMS (unusual diarrhea, constipation, pain near the anus) TENDERNESS IN MOUTH AND THROAT WITH OR WITHOUT PRESENCE OF ULCERS (sore throat, sores in mouth, or a toothache) UNUSUAL RASH, SWELLING OR PAIN  UNUSUAL VAGINAL DISCHARGE OR ITCHING   Items with * indicate a potential emergency and should be followed up as soon as possible or go to the Emergency Department if any problems should occur.  Please show the CHEMOTHERAPY ALERT CARD or IMMUNOTHERAPY ALERT  CARD at check-in to the Emergency Department and triage nurse.  Should you have questions after your visit or need to cancel or reschedule your appointment, please contact [facility]  Dept: [phone]  and follow the prompts.  Office hours are 8:00 a.m. to 4:30 p.m. Monday - Friday. Please note that voicemails left after 4:00 p.m. may not be returned until the following business day.  We are closed weekends and major holidays. You have access to a nurse at all times for urgent questions. Please call the main number to the clinic Dept: [phone] and follow the prompts.   For any non-urgent questions, you may also contact your provider using MyChart. We now offer e-Visits for anyone 74 and older to request care online for non-urgent symptoms. For details visit [email or web address].   Also download the MyChart app! Go to the app store, search "MyChart", open the app, select [facility], and log in with your MyChart username and password.  Due to Covid, a mask is required upon entering the hospital/clinic. If you do not have a mask, one will be given to you upon arrival. For doctor visits, patients may have 1 support person aged 33 or older with them. For treatment visits, patients cannot have anyone with them due to current Covid guidelines and our immunocompromised population.

## 2021-12-26 NOTE — Progress Notes (Signed)
[place]   Telephone:(336) [phone] Fax:(336) [phone]   Clinic Follow up Note   Patient Care Team: [name], PA as PCP - General (Family Medicine) Lorretta Harp, MD as PCP - Cardiology (Cardiology) [name], MD as Consulting Physician (General Surgery) [name], [name], MD as Consulting Physician (Gastroenterology) [name], MD as Consulting Physician (Hematology) Lorretta Harp, MD as Consulting Physician (Cardiology)  Date of Service:  12/26/2021  CHIEF COMPLAINT: f/u of cholangiocarcinoma   CURRENT THERAPY:  FOLFIRI q14d, started on 11/01/21  ASSESSMENT & PLAN:  Brandon Sherman is a 76 y.o. male with   1. Intrahepatic cholangiocarcinoma, cT2N0Mx, unresectable, with indeterminate lung nodules  -Diagnosed in 07/2019. CT scans and MRI liver show a large 7.3cm mass in the right hepatic lobe which abuts portal vein. Both our local surgeon Dr. Barry Dienes and Dr Carlis Abbott at [facility] concluded that cancer is not resectable, and therefore not likely curable, due to the invasion to portal vein. -He began palliative systemic treatment standard first line chemo with IV Cisplatin and Gemcitabine 2 weeks on/1 week off. Started 09/29/19, tolerating well cisplatin was held with cycle 9 on 03/23/20 due to fluid status/A. fib, now on maintenance gemcitabine alone -His FO results showed MSI stable disease, IDH1 mutations he may benefit from IDH inhibitor in the future -he went to single agent gemcitabine q2 weeks on 12/24/20 until disease progression in liver on 03/16/21 MRI with concern for new liver lesion  -Started second line FOLFOX q2 weeks plus durvalumab q4 weeks starting 04/05/21. Dose reduced from cycle 8 due to cytopenias -Restaging CT chest and MRI abdomen 10/21/21 showed progression in the lungs and liver. -changed to third line FOLFIRI started on 11/01/21, dose reduced for cytopenias. -Due to ongoing cytopenias, further dose reduced 5FU from 2000 mg/m2 to 1800 mg/m2  and irinotecan from 150 mg/m2 to 110 mg/m2 starting today with Cycle 2, and slightly increased back some on cycle 3 -he is tolerating FOLFIRI well overall. Labs reviewed, no concern, adequate to proceed with treatment today. -repeat scan after cycle 6, orders in place.   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 -stable, will monitor   3. Afib, CHF -diagnosed after PAC placement on 02/23/20. -CHADS2 score 2, moderate risk for stroke. Started on metoprolol and eliquis on 03/01/20. Tolerating well without bleeding.  -He had cardioversion on 05/19/20 -continue f/u with cardiology, Dr. Gwenlyn Found    5. DM, HTN, HLD, Gout -On Metformin, amlodipine, lisinopril, lasix, allopurinol -Continue to f/u with his PCP  -I previously reduced lasix to 1 tab BID rather than 2 tabs BID -monitoring BP and BG   6. Nicotine Use -He never smoked but has been chewing Tobacco for the past 60 years. He no longer drinks alcohol and tells me he has quit smoking (11/25/19).    7. GERD and gastritis, history of esophageal candidiasis -He had repeated EGD with Dr. Havery Moros in 07/2019. 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 May repeat EGD in future  -GERD resolved on PPI and carafate     Plan: -proceed with C5 FOLFIRI at same dose  -Udenyca on day 3  -lab, flush, f/u, and FOLFIRI in 2, 4, and 6 weeks -CT chest and MRI abd to be done in 3-4 weeks,after cycle 6    No problem-specific Assessment & Plan notes found for  this encounter.   SUMMARY OF ONCOLOGIC HISTORY: Oncology History Overview Note  Cancer Staging Intrahepatic cholangiocarcinoma ([place]) Staging form: Intrahepatic Bile Duct, AJCC 8th Edition - Clinical stage from 08/19/2019: Stage II (cT2, cN0, cM0) - Signed by [name], MD on 08/19/2019    Intrahepatic cholangiocarcinoma ([place])   06/28/2019 Imaging   CT AP W Contrast 06/28/19  IMPRESSION: 1. Heterogeneous hypodensity posteriorly in the right hepatic lobe and potentially extending into the caudate lobe suspicious for a mass. There is felt to be truncation of branches of the portal vein in this vicinity and some narrowing of the hepatic vein, as well as triangular-shaped regions of abnormal hypoenhancement posteriorly in the right hepatic lobe likely representing downstream vascular effects. Cannot exclude malignancy such as cholangiocarcinoma or hepatocellular carcinoma, and follow up hepatic protocol MRI with and without contrast is recommended to further characterize. 2. 4 mm right middle lobe pulmonary nodule is likely benign but may merit surveillance. 3. Cholelithiasis. 4.  Aortic Atherosclerosis (ICD10-I70.0). 5. Prostatomegaly. 6. Mild impingement at L3-4 and L4-5.   07/31/2019 Imaging   MRI Liver 07/31/19 IMPRESSION: 1. 7.3 cm in long axis mass in the right hepatic lobe spanning into the caudate lobe, high suspicion for malignancy such as hepatocellular carcinoma or cholangiocarcinoma. Suspected effacement or occlusion of the right hepatic vein and posterior branches of the right portal vein. Two smaller tumor nodules along the posterior periphery of the dominant mass. Tissue diagnosis is recommended. 2. No findings of pathologic adenopathy or distant metastatic spread. 3. 9 mm gallstone in the gallbladder. There is mild gallbladder wall thickening which may be from nondistention, correlate clinically in assessing for cholecystitis. 4.  Aortic Atherosclerosis (ICD10-I70.0). 5. Mild diffuse hepatic steatosis.   08/11/2019 Initial Biopsy   DIAGNOSIS: 08/11/19  A. LIVER, RIGHT, BIOPSY:  - Adenocarcinoma.   08/18/2019 Imaging   CT Chest 08/18/19  IMPRESSION: 1. Multiple pulmonary nodules largest at approximately 7 mm in the right lower lobe, nonspecific but concerning given findings in the liver. 2. No  signs of definitive metastatic disease, also with mildly enlarged upper abdominal lymph nodes as discussed.   Aortic Atherosclerosis (ICD10-I70.0).   08/19/2019 Initial Diagnosis   Intrahepatic cholangiocarcinoma ([place])   08/19/2019 Cancer Staging   Staging form: Intrahepatic Bile Duct, AJCC 8th Edition - Clinical stage from 08/19/2019: Stage II (cT2, cN0, cM0) - Signed by [name], MD on 08/19/2019    09/29/2019 - 07/19/2020 Chemotherapy   Cisplatin and Gemcitabine 2 weeks on/1 week off starting 09/29/19. Cisplatin held from cycle 9 (03/23/20) due to fluid status/Afib. He is now on maintenance Gemcitabine. Held after 07/19/20 to proceed with liver target therapy.     10/09/2019 Imaging   CT AP IMPRESSION: 1. The dominant right hepatic lobe mass is minimally reduced in size compared to prior exams, currently measuring 6.2 by 5.3 cm, previously 6.2 by 5.6 cm. However, there is a new small hypodense lesion centrally in the right hepatic lobe which is suspicious for a new small focus of tumor. Accordingly this is an overall mixed appearance. 2. Continued hypoenhancement in the liver downstream of the tumor likely attributable to narrowing or occlusion of the right hepatic vein by the tumor. By virtue of its location the tumor wraps around the intrahepatic portion of the IVC. 3. 4 mm right middle lobe pulmonary nodule, stable compared to earliest available comparison of 07/18/2019. Surveillance of the patient's pulmonary nodules is recommended. 4. Other imaging findings of potential clinical significance: Coronary atherosclerosis. Cholelithiasis. Prominent stool throughout the  colon favors constipation. Moderate prostatomegaly with heterogeneous enhancement of the prostate gland. Lumbar spondylosis and degenerative disc disease causing mild bilateral foraminal impingement at L3-4 and L4-5.   Aortic Atherosclerosis (ICD10-I70.0).   12/24/2019 Imaging   CT CAP W Contrast  IMPRESSION: 1. Right hepatic  lobe mass and adjacent right hepatic lobe nodules appear grossly stable. No evidence of distant metastatic disease. 2. Continued stability of small pulmonary nodules. Recommend attention on follow-up. 3. Cholelithiasis. 4. Enlarged prostate. 5. Aortic atherosclerosis (ICD10-I70.0). Coronary artery calcification.   02/23/2020 Procedure   He had PAC placed on 02/23/20.    04/08/2020 Imaging   CT CAP  IMPRESSION: 1. Stable or minimally decreased size of a very ill-defined, hypodense and somewhat retractile appearing mass of the central right lobe of the liver abutting the inferior vena cava and right portal vein, measuring approximately 5.7 x 5.0 cm, previously 6.2 x 5.2 cm when measured similarly. Findings are consistent with stable or minimally improved cholangiocarcinoma. 2. Small hypodense nodules of the right lobe identified on prior examination are poorly appreciated on this single phase contrast examination although not grossly changed. Attention on follow-up. 3. There are new, moderate bilateral pleural effusions and associated atelectasis or consolidation as well as a new small pericardial effusion, nonspecific although generally concerning and suspicious for malignant effusions. There is no directly visualized pleural nodularity. 4. Multiple small pulmonary nodules are stable.  No new nodules. 5. Coronary artery disease. Aortic Atherosclerosis (ICD10-I70.0).   07/06/2020 Imaging   MRI ABD IMPRESSION: 1. Interval decrease in size of the right hepatic lobe lesion in the medial aspect of the segment 7. Findings would suggest a good response to treatment with some contraction of the tumor. 2. No new hepatic lesions. No abdominal adenopathy or metastatic disease. 3. Stable mild intrahepatic biliary dilatation in the right hepatic lobe distal to the lesion. 4. Somewhat tortuous and almost beaded appearance of the hepatic and portal vein radicles. Findings could be radiation  related. 5. Cholelithiasis.  CT chest wo contrast IMPRESSION: 1. Persistent/stable moderate-sized bilateral pleural effusions with overlying atelectasis. 2. Stable small right pulmonary nodules. No new or progressive findings. Recommend continued surveillance. 3. No mediastinal or hilar mass or adenopathy. 4. Stable advanced three-vessel coronary artery calcifications. 5. Cholelithiasis. Aortic Atherosclerosis (ICD10-I70.0)   08/11/2020 Procedure   Y90 on 08/11/20 and 08/26/20 with Dr Laurence Ferrari    11/30/2020 Imaging   MRI Abdomen  IMPRESSION: 1. Today's study demonstrates progression of disease with enlarging central lesion in the right lobe of the liver involving portions of segments 7 and 8, now with evidence of some tumor thrombus extension into the intrahepatic portion of the inferior vena cava. This is associated with increasing intrahepatic biliary ductal dilatation in segment 7 of the liver. 2. In addition, there is some subtle hyperenhancement of the distal common bile duct at and immediately proximally to the level of the ampulla. There is also some subtle delayed enhancement around this region in the pancreatic head. This is of uncertain etiology and significance, but may be inflammatory and currently is not associated with proximal common bile duct dilatation. However, close attention on follow-up imaging is recommended. 3. Cholelithiasis without evidence of acute cholecystitis at this time. 4. Aortic atherosclerosis.     12/24/2020 - 03/21/2021 Chemotherapy   Restart Gemcitabine 2 weeks on/1 week off given disease progression beginning 12/24/20. Discontinued after 03/21/21 due to disease progression in liver.     03/16/2021 Imaging   MRI abdomen  IMPRESSION: No significant change in size  of irregular hypovascular mass in the medial right hepatic lobe.   New 1.1 cm hypovascular lesion with peripheral rim enhancement in liver dome, suspicious for metastatic disease.   New  small right pleural effusion and mild ascites.   Cholelithiasis, without evidence of cholecystitis or biliary dilatation.   04/05/2021 -  Chemotherapy   Second-line FOLFOX q2weeks starting 04/05/21    04/05/2021 -  Chemotherapy   Immunotherapy with Durvalumab (Imfinzi) q4weeks starting 04/05/21. (in addition to second line FOLFOX)   06/23/2021 Imaging   MRI Abdomen IMPRESSION: 1. No significant change in subcapsular mass of the posterior right lobe of the liver. 2. No significant change in an additional small hyperenhancing lesion of the liver dome, which remains suspicious for a satellite lesion. 3. No new liver lesions. 4. 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 5. Cholelithiasis without evidence of acute cholecystitis. 6. Trace ascites.  CT Chest w/o contrast IMPRESSION: 1. Occasional small pulmonary nodules in the right lung are stable, for example a 6 mm nodule right lower lobe and a 3 mm nodule of the lateral segment right middle lobe. These are nonspecific and likely benign, incidental sequelae of infection or inflammation, metastatic disease not favored. Attention on follow-up. 2. Previously seen bilateral pleural effusions are resolved. 3. Hypodense lesion of the posterior right lobe of the liver, keeping with known cholangiocarcinoma and better characterized by same day MR. 4. Coronary artery disease.   10/21/2021 Imaging   EXAM: MRI ABDOMEN WITHOUT AND WITH CONTRAST  IMPRESSION: 1. Mild progression of dominant right hepatic lobe mass with involvement of the right portal, hepatic veins and minimal extension into the IVC. 2. Hepatic dome satellite lesion, new or more conspicuous today. 3. Cholelithiasis 4. Decrease in trace perihepatic and perisplenic ascites. 5.  Aortic Atherosclerosis (ICD10-I70.0).   10/21/2021 Imaging   EXAM: CT CHEST WITHOUT CONTRAST  IMPRESSION: 1. Since 06/23/2021, enlargement of a right lower lobe and possible  enlargement of a right middle lobe pulmonary nodule. Findings are suspicious for metastatic disease. 2. No thoracic adenopathy. 3. Coronary artery atherosclerosis. Aortic Atherosclerosis (ICD10-I70.0).   11/01/2021 -  Chemotherapy   Patient is on Treatment Plan : PANCREAS FOLFIRI q14d        INTERVAL HISTORY:  Brandon Sherman is here for a follow up of cholangiocarcinoma. He was last seen by me on 12/12/21. He presents to the clinic accompanied by his wife. He reports she is tolerating treatment well overall with only fatigue. He denies pain, bloating, issues with appetite, or breathing issues. He reports recent A.fib that resolved on its own.   All other systems were reviewed with the patient and are negative.  MEDICAL HISTORY:  Past Medical History:  Diagnosis Date   Arthritis    Diabetes ([place])    GERD (gastroesophageal reflux disease)    Hyperlipidemia    Hypertension    Intrahepatic cholangiocarcinoma ([place])     SURGICAL HISTORY: Past Surgical History:  Procedure Laterality Date   APPENDECTOMY  1980   CARDIOVERSION N/A 04/27/2020   Procedure: CARDIOVERSION;  Surgeon: Dorothy Spark, MD;  Location: [facility] ENDOSCOPY;  Service: Cardiovascular;  Laterality: N/A;   CARDIOVERSION N/A 05/19/2020   Procedure: CARDIOVERSION;  Surgeon: Elouise Munroe, MD;  Location: [facility] ENDOSCOPY;  Service: Cardiovascular;  Laterality: N/A;   COLONOSCOPY     IR 3D INDEPENDENT WKST  08/11/2020   IR 3D INDEPENDENT WKST  08/11/2020   IR ANGIOGRAM SELECTIVE EACH ADDITIONAL VESSEL  08/11/2020   IR ANGIOGRAM SELECTIVE EACH ADDITIONAL VESSEL  08/11/2020   IR ANGIOGRAM SELECTIVE EACH ADDITIONAL VESSEL  08/11/2020   IR ANGIOGRAM SELECTIVE EACH ADDITIONAL VESSEL  08/11/2020   IR ANGIOGRAM SELECTIVE EACH ADDITIONAL VESSEL  08/11/2020   IR ANGIOGRAM SELECTIVE EACH ADDITIONAL VESSEL  08/11/2020   IR ANGIOGRAM SELECTIVE EACH ADDITIONAL VESSEL  08/26/2020   IR ANGIOGRAM SELECTIVE EACH ADDITIONAL VESSEL  08/26/2020    IR ANGIOGRAM VISCERAL SELECTIVE  08/11/2020   IR ANGIOGRAM VISCERAL SELECTIVE  08/26/2020   IR EMBO ARTERIAL NOT HEMORR HEMANG INC GUIDE ROADMAPPING  08/11/2020   IR EMBO TUMOR ORGAN ISCHEMIA INFARCT INC GUIDE ROADMAPPING  08/26/2020   IR IMAGING GUIDED PORT INSERTION  02/23/2020   IR RADIOLOGIST EVAL & MGMT  07/14/2020   IR RADIOLOGIST EVAL & MGMT  09/30/2020   IR RADIOLOGIST EVAL & MGMT  12/01/2020   IR US GUIDE VASC ACCESS LEFT  08/26/2020   IR US GUIDE VASC ACCESS RIGHT  08/11/2020    I have reviewed the social history and family history with the patient and they are unchanged from previous note.  ALLERGIES:  has No Known Allergies.  MEDICATIONS:  Current Outpatient Medications  Medication Sig Dispense Refill   allopurinol (ZYLOPRIM) 100 MG tablet Take 100 mg by mouth daily.     colchicine 0.6 MG tablet Take 1 tablet (0.6 mg total) by mouth daily as needed (gout flare). Take daily for 3 days, then as needed for gout flare 30 tablet 0   diltiazem (CARDIZEM CD) 240 MG 24 hr capsule TAKE 1 CAPSULE BY MOUTH EVERY DAY, STOP AMLODIPINE 90 capsule 3   ELIQUIS 5 MG TABS tablet TAKE 1 TABLET TWICE DAILY 180 tablet 1   fenofibrate (TRICOR) 145 MG tablet Take 145 mg by mouth daily.     finasteride (PROSCAR) 5 MG tablet Take 5 mg by mouth daily.     furosemide (LASIX) 40 MG tablet TAKE 2 TABLETS (80 MG TOTAL) BY MOUTH 2 (TWO) TIMES DAILY. 360 tablet 3   gabapentin (NEURONTIN) 100 MG capsule Take 100 mg by mouth at bedtime.     hydrALAZINE (APRESOLINE) 50 MG tablet Take 1 tablet (50 mg total) by mouth 2 (two) times daily. 180 tablet 2   KLOR-CON M20 20 MEQ tablet TAKE 1 TABLET BY MOUTH TWICE A DAY 180 tablet 3   lidocaine-prilocaine (EMLA) cream Apply 1 application topically as needed. 30 g 3   magnesium oxide (MAG-OX) 400 (240 Mg) MG tablet TAKE 1 TABLET BY MOUTH EVERY DAY 30 tablet 1   metFORMIN (GLUCOPHAGE-XR) 500 MG 24 hr tablet      metoprolol succinate (TOPROL XL) 25 MG 24 hr tablet Take 1 tablet  (25 mg total) by mouth daily. 90 tablet 3   metoprolol tartrate (LOPRESSOR) 50 MG tablet      ondansetron (ZOFRAN) 8 MG tablet TAKE 1 TABLET BY MOUTH TWICE A DAY AS NEEDED START ON 3RD DAY AFTER CHEMOTHERAPY (Patient not taking: Reported on 11/16/2021) 30 tablet 1   pantoprazole (PROTONIX) 40 MG tablet TAKE 1 TABLET BY MOUTH TWICE A DAY 180 tablet 2   pravastatin (PRAVACHOL) 40 MG tablet Take 40 mg by mouth daily.     sucralfate (CARAFATE) 1 g tablet Take 1 tablet (1 g total) by mouth every 6 (six) hours as needed. Please schedule a yearly follow up for further refills: [phone] (Patient not taking: Reported on 10/10/2021) 60 tablet 0   tamsulosin (FLOMAX) 0.4 MG CAPS capsule Take 0.4 mg by mouth daily.     No current facility-administered  medications for this visit.    PHYSICAL EXAMINATION: ECOG PERFORMANCE STATUS: 1 - Symptomatic but completely ambulatory  Vitals:   12/26/21 1155  BP: 136/66  Pulse: (!) 59  Resp: 17  Temp: 98.4 F (36.9 C)  SpO2: 99%   Wt Readings from Last 3 Encounters:  12/26/21 222 lb 3.2 oz (100.8 kg)  12/12/21 219 lb 8 oz (99.6 kg)  11/28/21 218 lb 9.6 oz (99.2 kg)     GENERAL:alert, no distress and comfortable SKIN: skin color normal, no rashes or significant lesions EYES: normal, Conjunctiva are pink and non-injected, sclera clear  NEURO: alert & oriented x 3 with fluent speech  LABORATORY DATA:  I have reviewed the data as listed CBC Latest Ref Rng & Units 12/26/2021 12/12/2021 11/28/2021  WBC 4.0 - 10.5 K/uL 6.9 7.5 8.8  Hemoglobin 13.0 - 17.0 g/dL 10.0(L) 9.9(L) 10.2(L)  Hematocrit 39.0 - 52.0 % 31.6(L) 31.1(L) 32.0(L)  Platelets 150 - 400 K/uL 109(L) 110(L) 109(L)     CMP Latest Ref Rng & Units 12/26/2021 12/12/2021 11/28/2021  Glucose 70 - 99 mg/dL 172(H) 174(H) 148(H)  BUN 8 - 23 mg/dL _0 Creatinine 0.61 - 1.24 mg/dL 1.45(H) 1.51(H) 1.64(H)  Sodium 135 - 145 mmol/L 137 137 137  Potassium 3.5 - 5.1 mmol/L 4.2 4.3 3.9  Chloride 98 - 111  mmol/L 107 107 106  CO2 22 - 32 mmol/L _1 Calcium 8.9 - 10.3 mg/dL 9.4 9.5 9.4  Total Protein 6.5 - 8.1 g/dL 6.6 6.6 6.8  Total Bilirubin 0.3 - 1.2 mg/dL 0.4 0.4 0.4  Alkaline Phos 38 - 126 U/L 107 105 114  AST 15 - 41 U/L _2 ALT 0 - 44 U/L _3 RADIOGRAPHIC STUDIES: I have personally reviewed the radiological images as listed and agreed with the findings in the report. No results found.    Orders Placed This Encounter  Procedures   MR Abdomen W Wo Contrast    Standing Status:   Future    Standing Expiration Date:   12/26/2022    Order Specific Question:   If indicated for the ordered procedure, I authorize the administration of contrast media per Radiology protocol    Answer:   Yes    Order Specific Question:   What is the patient's sedation requirement?    Answer:   No Sedation    Order Specific Question:   Does the patient have a pacemaker or implanted devices?    Answer:   No    Order Specific Question:   Preferred imaging location?    Answer:   [facility] (table limit - 550 lbs)   CT Chest Wo Contrast    Standing Status:   Future    Standing Expiration Date:   12/26/2022    Order Specific Question:   Preferred imaging location?    Answer:   [facility]   All questions were answered. The patient knows to call the clinic with any problems, questions or concerns. No barriers to learning was detected.      [name], MD 12/26/2021   I, Wilburn Mylar, am acting as scribe for [name], MD.   I have reviewed the above documentation for accuracy and completeness, and I agree with the above.

## 2021-12-28 ENCOUNTER — Inpatient Hospital Stay: Payer: Medicare HMO

## 2021-12-28 ENCOUNTER — Other Ambulatory Visit: Payer: Self-pay

## 2021-12-28 VITALS — BP 125/60 | HR 53 | Temp 98.8°F | Resp 18

## 2021-12-28 DIAGNOSIS — C221 Intrahepatic bile duct carcinoma: Secondary | ICD-10-CM | POA: Diagnosis not present

## 2021-12-28 DIAGNOSIS — Z5111 Encounter for antineoplastic chemotherapy: Secondary | ICD-10-CM | POA: Diagnosis not present

## 2021-12-28 DIAGNOSIS — Z5189 Encounter for other specified aftercare: Secondary | ICD-10-CM | POA: Diagnosis not present

## 2021-12-28 DIAGNOSIS — Z7189 Other specified counseling: Secondary | ICD-10-CM

## 2021-12-28 MED ORDER — SODIUM CHLORIDE 0.9% FLUSH
10.0000 mL | INTRAVENOUS | Status: DC | PRN
  Administered 2021-12-28: 10 mL

## 2021-12-28 MED ORDER — PEGFILGRASTIM-CBQV 6 MG/0.6ML ~~LOC~~ SOSY
6.0000 mg | PREFILLED_SYRINGE | Freq: Once | SUBCUTANEOUS | Status: AC
  Administered 2021-12-28: 6 mg via SUBCUTANEOUS
  Filled 2021-12-28: qty 0.6

## 2021-12-28 MED ORDER — HEPARIN SOD (PORK) LOCK FLUSH 100 UNIT/ML IV SOLN
500.0000 [IU] | Freq: Once | INTRAVENOUS | Status: AC | PRN
  Administered 2021-12-28: 500 [IU]

## 2022-01-06 MED FILL — Dexamethasone Sodium Phosphate Inj 100 MG/10ML: INTRAMUSCULAR | Qty: 1 | Status: AC

## 2022-01-09 ENCOUNTER — Inpatient Hospital Stay: Payer: Medicare HMO | Admitting: Hematology

## 2022-01-09 ENCOUNTER — Inpatient Hospital Stay: Payer: Medicare HMO

## 2022-01-09 ENCOUNTER — Other Ambulatory Visit: Payer: Self-pay

## 2022-01-09 VITALS — BP 131/56 | HR 63 | Temp 98.0°F | Resp 19 | Ht 69.0 in | Wt 221.3 lb

## 2022-01-09 DIAGNOSIS — C221 Intrahepatic bile duct carcinoma: Secondary | ICD-10-CM

## 2022-01-09 DIAGNOSIS — Z7189 Other specified counseling: Secondary | ICD-10-CM

## 2022-01-09 DIAGNOSIS — Z95828 Presence of other vascular implants and grafts: Secondary | ICD-10-CM

## 2022-01-09 DIAGNOSIS — Z5111 Encounter for antineoplastic chemotherapy: Secondary | ICD-10-CM | POA: Diagnosis not present

## 2022-01-09 DIAGNOSIS — Z5189 Encounter for other specified aftercare: Secondary | ICD-10-CM | POA: Diagnosis not present

## 2022-01-09 LAB — CMP (CANCER CENTER ONLY)
ALT: 25 U/L (ref 0–44)
AST: 24 U/L (ref 15–41)
Albumin: 3.8 g/dL (ref 3.5–5.0)
Alkaline Phosphatase: 109 U/L (ref 38–126)
Anion gap: 6 (ref 5–15)
BUN: 14 mg/dL (ref 8–23)
CO2: 24 mmol/L (ref 22–32)
Calcium: 9.2 mg/dL (ref 8.9–10.3)
Chloride: 108 mmol/L (ref 98–111)
Creatinine: 1.49 mg/dL — ABNORMAL HIGH (ref 0.61–1.24)
GFR, Estimated: 49 mL/min — ABNORMAL LOW (ref >60)
Glucose, Bld: 195 mg/dL — ABNORMAL HIGH (ref 70–99)
Potassium: 4.1 mmol/L (ref 3.5–5.1)
Sodium: 138 mmol/L (ref 135–145)
Total Bilirubin: 0.5 mg/dL (ref 0.3–1.2)
Total Protein: 6.5 g/dL (ref 6.5–8.1)

## 2022-01-09 LAB — CBC WITH DIFFERENTIAL (CANCER CENTER ONLY)
Abs Immature Granulocytes: 0.12 10*3/uL — ABNORMAL HIGH (ref 0.00–0.07)
Basophils Absolute: 0.1 10*3/uL (ref 0.0–0.1)
Basophils Relative: 1 %
Eosinophils Absolute: 0.1 10*3/uL (ref 0.0–0.5)
Eosinophils Relative: 1 %
HCT: 30.8 % — ABNORMAL LOW (ref 39.0–52.0)
Hemoglobin: 9.8 g/dL — ABNORMAL LOW (ref 13.0–17.0)
Immature Granulocytes: 2 %
Lymphocytes Relative: 9 %
Lymphs Abs: 0.6 10*3/uL — ABNORMAL LOW (ref 0.7–4.0)
MCH: 32.2 pg (ref 26.0–34.0)
MCHC: 31.8 g/dL (ref 30.0–36.0)
MCV: 101.3 fL — ABNORMAL HIGH (ref 80.0–100.0)
Monocytes Absolute: 0.5 10*3/uL (ref 0.1–1.0)
Monocytes Relative: 9 %
Neutro Abs: 5 10*3/uL (ref 1.7–7.7)
Neutrophils Relative %: 78 %
Platelet Count: 101 10*3/uL — ABNORMAL LOW (ref 150–400)
RBC: 3.04 MIL/uL — ABNORMAL LOW (ref 4.22–5.81)
RDW: 17.8 % — ABNORMAL HIGH (ref 11.5–15.5)
WBC Count: 6.4 10*3/uL (ref 4.0–10.5)
nRBC: 0 % (ref 0.0–0.2)

## 2022-01-09 MED ORDER — SODIUM CHLORIDE 0.9 % IV SOLN
400.0000 mg/m2 | Freq: Once | INTRAVENOUS | Status: AC
  Administered 2022-01-09: 872 mg via INTRAVENOUS
  Filled 2022-01-09: qty 43.6

## 2022-01-09 MED ORDER — SODIUM CHLORIDE 0.9 % IV SOLN
130.0000 mg/m2 | Freq: Once | INTRAVENOUS | Status: AC
  Administered 2022-01-09: 280 mg via INTRAVENOUS
  Filled 2022-01-09: qty 14

## 2022-01-09 MED ORDER — SODIUM CHLORIDE 0.9 % IV SOLN
10.0000 mg | Freq: Once | INTRAVENOUS | Status: AC
  Administered 2022-01-09: 10 mg via INTRAVENOUS
  Filled 2022-01-09: qty 10

## 2022-01-09 MED ORDER — SODIUM CHLORIDE 0.9 % IV SOLN: Freq: Once | INTRAVENOUS | Status: AC

## 2022-01-09 MED ORDER — SODIUM CHLORIDE 0.9% FLUSH
10.0000 mL | Freq: Once | INTRAVENOUS | Status: AC
  Administered 2022-01-09: 10 mL

## 2022-01-09 MED ORDER — PALONOSETRON HCL INJECTION 0.25 MG/5ML
0.2500 mg | Freq: Once | INTRAVENOUS | Status: AC
  Administered 2022-01-09: 0.25 mg via INTRAVENOUS
  Filled 2022-01-09: qty 5

## 2022-01-09 MED ORDER — SODIUM CHLORIDE 0.9 % IV SOLN
2000.0000 mg/m2 | INTRAVENOUS | Status: DC
  Administered 2022-01-09: 4350 mg via INTRAVENOUS
  Filled 2022-01-09: qty 87

## 2022-01-09 NOTE — Patient Instructions (Signed)
[facility]  Discharge Instructions: Thank you for choosing [facility] to provide your oncology and hematology care.   If you have a lab appointment with the [facility], please go directly to the [facility] and check in at the registration area.   Wear comfortable clothing and clothing appropriate for easy access to any Portacath or PICC line.   We strive to give you quality time with your provider. You may need to reschedule your appointment if you arrive late (15 or more minutes).  Arriving late affects you and other patients whose appointments are after yours.  Also, if you miss three or more appointments without notifying the office, you may be dismissed from the clinic at the providers discretion.      For prescription refill requests, have your pharmacy contact our office and allow 72 hours for refills to be completed.    Today you received the following chemotherapy and/or immunotherapy agents irinotecan, leucovorin, fluorourcil      To help prevent nausea and vomiting after your treatment, we encourage you to take your nausea medication as directed.  BELOW ARE SYMPTOMS THAT SHOULD BE REPORTED IMMEDIATELY: *FEVER GREATER THAN 100.4 F (38 C) OR HIGHER *CHILLS OR SWEATING *NAUSEA AND VOMITING THAT IS NOT CONTROLLED WITH YOUR NAUSEA MEDICATION *UNUSUAL SHORTNESS OF BREATH *UNUSUAL BRUISING OR BLEEDING *URINARY PROBLEMS (pain or burning when urinating, or frequent urination) *BOWEL PROBLEMS (unusual diarrhea, constipation, pain near the anus) TENDERNESS IN MOUTH AND THROAT WITH OR WITHOUT PRESENCE OF ULCERS (sore throat, sores in mouth, or a toothache) UNUSUAL RASH, SWELLING OR PAIN  UNUSUAL VAGINAL DISCHARGE OR ITCHING   Items with * indicate a potential emergency and should be followed up as soon as possible or go to the Emergency Department if any problems should occur.  Please show the CHEMOTHERAPY ALERT CARD or IMMUNOTHERAPY  ALERT CARD at check-in to the Emergency Department and triage nurse.  Should you have questions after your visit or need to cancel or reschedule your appointment, please contact [facility]  Dept: [phone]  and follow the prompts.  Office hours are 8:00 a.m. to 4:30 p.m. Monday - Friday. Please note that voicemails left after 4:00 p.m. may not be returned until the following business day.  We are closed weekends and major holidays. You have access to a nurse at all times for urgent questions. Please call the main number to the clinic Dept: [phone] and follow the prompts.   For any non-urgent questions, you may also contact your provider using MyChart. We now offer e-Visits for anyone 75 and older to request care online for non-urgent symptoms. For details visit [email or web address].   Also download the MyChart app! Go to the app store, search "MyChart", open the app, select [facility], and log in with your MyChart username and password.  Due to Covid, a mask is required upon entering the hospital/clinic. If you do not have a mask, one will be given to you upon arrival. For doctor visits, patients may have 1 support person aged 77 or older with them. For treatment visits, patients cannot have anyone with them due to current Covid guidelines and our immunocompromised population.

## 2022-01-09 NOTE — Progress Notes (Signed)
Portal   [phone] [phone]   Clinic Follow up Note   Patient Care Team: [name], PA as PCP - General (Family Medicine) Lorretta Harp, MD as PCP - Cardiology (Cardiology) [name], MD as Consulting Physician (General Surgery) [name], [name], MD as Consulting Physician (Gastroenterology) [name], MD as Consulting Physician (Hematology) Lorretta Harp, MD as Consulting Physician (Cardiology)  Date of Service:  01/09/2022  CHIEF COMPLAINT: f/u of cholangiocarcinoma   CURRENT THERAPY:  FOLFIRI q14d, started on 11/01/21  ASSESSMENT & PLAN:  Brandon Sherman is a 76 y.o. male with   1. Intrahepatic cholangiocarcinoma, cT2N0Mx, unresectable, with indeterminate lung nodules  -Diagnosed in 07/2019. CT scans and MRI liver show a large 7.3cm mass in the right hepatic lobe which abuts portal vein. Both our local surgeon Dr. Barry Dienes and Dr Carlis Abbott at [facility] concluded that cancer is not resectable, and therefore not likely curable, due to the invasion to portal vein. -He began palliative systemic treatment standard first line chemo with IV Cisplatin and Gemcitabine 2 weeks on/1 week off. Started 09/29/19, tolerating well cisplatin was held with cycle 9 on 03/23/20 due to fluid status/A. fib, now on maintenance gemcitabine alone -His FO results showed MSI stable disease, IDH1 mutations he may benefit from IDH inhibitor in the future -he went to single agent gemcitabine q2 weeks on 12/24/20 until disease progression in liver on 03/16/21 MRI with concern for new liver lesion  -Started second line FOLFOX q2 weeks plus durvalumab q4 weeks starting 04/05/21. Dose reduced from cycle 8 due to cytopenias -Restaging CT chest and MRI abdomen 10/21/21 showed progression in the lungs and liver. -changed to third line FOLFIRI started on 11/01/21, dose reduced for cytopenias. -Due to ongoing cytopenias, further dose reduced 5FU from 2000 mg/m2 to 1800 mg/m2  and irinotecan from 150 mg/m2 to 110 mg/m2 starting today with Cycle 2, and slightly increased back some on cycle 3 -he is tolerating FOLFIRI well overall. Labs reviewed, no concern, lab reviewed, adequate to proceed with treatment today. 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 -improving   3. Afib, CHF -diagnosed after PAC placement on 02/23/20. -CHADS2 score 2, moderate risk for stroke. Started on metoprolol and eliquis on 03/01/20. Tolerating well without bleeding.  -He had cardioversion on 05/19/20 -continue f/u with cardiology, Dr. Gwenlyn Found    5. DM, HTN, HLD, Gout -On Metformin, amlodipine, lisinopril, lasix, allopurinol -Continue to f/u with his PCP  -I previously reduced lasix to 1 tab BID rather than 2 tabs BID -monitoring BP and BG   6. Nicotine Use -He never smoked but has been chewing Tobacco for the past 60 years. He no longer drinks alcohol and tells me he has quit smoking (11/25/19).    7. GERD and gastritis, history of esophageal candidiasis -He had repeated EGD with Dr. Havery Moros in 07/2019. 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 May repeat EGD in future  -GERD resolved on PPI and carafate     Plan: -proceed with C6 FOLFIRI at same dose  -Udenyca on day 3  -MRI abd and CT chest 01/19/22 -lab, flush, f/u, and FOLFIRI in 2, 4, and 6 weeks   No problem-specific Assessment & Plan notes found for this encounter.   SUMMARY OF ONCOLOGIC HISTORY: Oncology  History Overview Note  Cancer Staging Intrahepatic cholangiocarcinoma ([place]) Staging form: Intrahepatic Bile Duct, AJCC 8th Edition - Clinical stage from 08/19/2019: Stage II (cT2, cN0, cM0) - Signed by [name], MD on 08/19/2019    Intrahepatic cholangiocarcinoma ([place])  06/28/2019 Imaging   CT  AP W Contrast 06/28/19  IMPRESSION: 1. Heterogeneous hypodensity posteriorly in the right hepatic lobe and potentially extending into the caudate lobe suspicious for a mass. There is felt to be truncation of branches of the portal vein in this vicinity and some narrowing of the hepatic vein, as well as triangular-shaped regions of abnormal hypoenhancement posteriorly in the right hepatic lobe likely representing downstream vascular effects. Cannot exclude malignancy such as cholangiocarcinoma or hepatocellular carcinoma, and follow up hepatic protocol MRI with and without contrast is recommended to further characterize. 2. 4 mm right middle lobe pulmonary nodule is likely benign but may merit surveillance. 3. Cholelithiasis. 4.  Aortic Atherosclerosis (ICD10-I70.0). 5. Prostatomegaly. 6. Mild impingement at L3-4 and L4-5.   07/31/2019 Imaging   MRI Liver 07/31/19 IMPRESSION: 1. 7.3 cm in long axis mass in the right hepatic lobe spanning into the caudate lobe, high suspicion for malignancy such as hepatocellular carcinoma or cholangiocarcinoma. Suspected effacement or occlusion of the right hepatic vein and posterior branches of the right portal vein. Two smaller tumor nodules along the posterior periphery of the dominant mass. Tissue diagnosis is recommended. 2. No findings of pathologic adenopathy or distant metastatic spread. 3. 9 mm gallstone in the gallbladder. There is mild gallbladder wall thickening which may be from nondistention, correlate clinically in assessing for cholecystitis. 4.  Aortic Atherosclerosis (ICD10-I70.0). 5. Mild diffuse hepatic steatosis.   08/11/2019 Initial Biopsy   DIAGNOSIS: 08/11/19  A. LIVER, RIGHT, BIOPSY:  - Adenocarcinoma.   08/18/2019 Imaging   CT Chest 08/18/19  IMPRESSION: 1. Multiple pulmonary nodules largest at approximately 7 mm in the right lower lobe, nonspecific but concerning given findings in the liver. 2. No signs of definitive  metastatic disease, also with mildly enlarged upper abdominal lymph nodes as discussed.   Aortic Atherosclerosis (ICD10-I70.0).   08/19/2019 Initial Diagnosis   Intrahepatic cholangiocarcinoma ([place])   08/19/2019 Cancer Staging   Staging form: Intrahepatic Bile Duct, AJCC 8th Edition - Clinical stage from 08/19/2019: Stage II (cT2, cN0, cM0) - Signed by [name], MD on 08/19/2019    09/29/2019 - 07/19/2020 Chemotherapy   Cisplatin and Gemcitabine 2 weeks on/1 week off starting 09/29/19. Cisplatin held from cycle 9 (03/23/20) due to fluid status/Afib. He is now on maintenance Gemcitabine. Held after 07/19/20 to proceed with liver target therapy.     10/09/2019 Imaging   CT AP IMPRESSION: 1. The dominant right hepatic lobe mass is minimally reduced in size compared to prior exams, currently measuring 6.2 by 5.3 cm, previously 6.2 by 5.6 cm. However, there is a new small hypodense lesion centrally in the right hepatic lobe which is suspicious for a new small focus of tumor. Accordingly this is an overall mixed appearance. 2. Continued hypoenhancement in the liver downstream of the tumor likely attributable to narrowing or occlusion of the right hepatic vein by the tumor. By virtue of its location the tumor wraps around the intrahepatic portion of the IVC. 3. 4 mm right middle lobe pulmonary nodule, stable compared to earliest available comparison of 07/18/2019. Surveillance of the patient's pulmonary nodules is recommended. 4. Other imaging findings of potential clinical significance: Coronary atherosclerosis. Cholelithiasis. Prominent stool throughout the colon favors constipation. Moderate prostatomegaly with heterogeneous enhancement of  the prostate gland. Lumbar spondylosis and degenerative disc disease causing mild bilateral foraminal impingement at L3-4 and L4-5.   Aortic Atherosclerosis (ICD10-I70.0).   12/24/2019 Imaging   CT CAP W Contrast  IMPRESSION: 1. Right hepatic lobe mass and adjacent  right hepatic lobe nodules appear grossly stable. No evidence of distant metastatic disease. 2. Continued stability of small pulmonary nodules. Recommend attention on follow-up. 3. Cholelithiasis. 4. Enlarged prostate. 5. Aortic atherosclerosis (ICD10-I70.0). Coronary artery calcification.   02/23/2020 Procedure   He had PAC placed on 02/23/20.    04/08/2020 Imaging   CT CAP  IMPRESSION: 1. Stable or minimally decreased size of a very ill-defined, hypodense and somewhat retractile appearing mass of the central right lobe of the liver abutting the inferior vena cava and right portal vein, measuring approximately 5.7 x 5.0 cm, previously 6.2 x 5.2 cm when measured similarly. Findings are consistent with stable or minimally improved cholangiocarcinoma. 2. Small hypodense nodules of the right lobe identified on prior examination are poorly appreciated on this single phase contrast examination although not grossly changed. Attention on follow-up. 3. There are new, moderate bilateral pleural effusions and associated atelectasis or consolidation as well as a new small pericardial effusion, nonspecific although generally concerning and suspicious for malignant effusions. There is no directly visualized pleural nodularity. 4. Multiple small pulmonary nodules are stable.  No new nodules. 5. Coronary artery disease. Aortic Atherosclerosis (ICD10-I70.0).   07/06/2020 Imaging   MRI ABD IMPRESSION: 1. Interval decrease in size of the right hepatic lobe lesion in the medial aspect of the segment 7. Findings would suggest a good response to treatment with some contraction of the tumor. 2. No new hepatic lesions. No abdominal adenopathy or metastatic disease. 3. Stable mild intrahepatic biliary dilatation in the right hepatic lobe distal to the lesion. 4. Somewhat tortuous and almost beaded appearance of the hepatic and portal vein radicles. Findings could be radiation related. 5.  Cholelithiasis.  CT chest wo contrast IMPRESSION: 1. Persistent/stable moderate-sized bilateral pleural effusions with overlying atelectasis. 2. Stable small right pulmonary nodules. No new or progressive findings. Recommend continued surveillance. 3. No mediastinal or hilar mass or adenopathy. 4. Stable advanced three-vessel coronary artery calcifications. 5. Cholelithiasis. Aortic Atherosclerosis (ICD10-I70.0)   08/11/2020 Procedure   Y90 on 08/11/20 and 08/26/20 with Dr Laurence Ferrari    11/30/2020 Imaging   MRI Abdomen  IMPRESSION: 1. Today's study demonstrates progression of disease with enlarging central lesion in the right lobe of the liver involving portions of segments 7 and 8, now with evidence of some tumor thrombus extension into the intrahepatic portion of the inferior vena cava. This is associated with increasing intrahepatic biliary ductal dilatation in segment 7 of the liver. 2. In addition, there is some subtle hyperenhancement of the distal common bile duct at and immediately proximally to the level of the ampulla. There is also some subtle delayed enhancement around this region in the pancreatic head. This is of uncertain etiology and significance, but may be inflammatory and currently is not associated with proximal common bile duct dilatation. However, close attention on follow-up imaging is recommended. 3. Cholelithiasis without evidence of acute cholecystitis at this time. 4. Aortic atherosclerosis.     12/24/2020 - 03/21/2021 Chemotherapy   Restart Gemcitabine 2 weeks on/1 week off given disease progression beginning 12/24/20. Discontinued after 03/21/21 due to disease progression in liver.     03/16/2021 Imaging   MRI abdomen  IMPRESSION: No significant change in size of irregular hypovascular mass in the medial right hepatic  lobe.   New 1.1 cm hypovascular lesion with peripheral rim enhancement in liver dome, suspicious for metastatic disease.   New small right  pleural effusion and mild ascites.   Cholelithiasis, without evidence of cholecystitis or biliary dilatation.   04/05/2021 -  Chemotherapy   Second-line FOLFOX q2weeks starting 04/05/21    04/05/2021 -  Chemotherapy   Immunotherapy with Durvalumab (Imfinzi) q4weeks starting 04/05/21. (in addition to second line FOLFOX)   06/23/2021 Imaging   MRI Abdomen IMPRESSION: 1. No significant change in subcapsular mass of the posterior right lobe of the liver. 2. No significant change in an additional small hyperenhancing lesion of the liver dome, which remains suspicious for a satellite lesion. 3. No new liver lesions. 4. 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 5. Cholelithiasis without evidence of acute cholecystitis. 6. Trace ascites.  CT Chest w/o contrast IMPRESSION: 1. Occasional small pulmonary nodules in the right lung are stable, for example a 6 mm nodule right lower lobe and a 3 mm nodule of the lateral segment right middle lobe. These are nonspecific and likely benign, incidental sequelae of infection or inflammation, metastatic disease not favored. Attention on follow-up. 2. Previously seen bilateral pleural effusions are resolved. 3. Hypodense lesion of the posterior right lobe of the liver, keeping with known cholangiocarcinoma and better characterized by same day MR. 4. Coronary artery disease.   10/21/2021 Imaging   EXAM: MRI ABDOMEN WITHOUT AND WITH CONTRAST  IMPRESSION: 1. Mild progression of dominant right hepatic lobe mass with involvement of the right portal, hepatic veins and minimal extension into the IVC. 2. Hepatic dome satellite lesion, new or more conspicuous today. 3. Cholelithiasis 4. Decrease in trace perihepatic and perisplenic ascites. 5.  Aortic Atherosclerosis (ICD10-I70.0).   10/21/2021 Imaging   EXAM: CT CHEST WITHOUT CONTRAST  IMPRESSION: 1. Since 06/23/2021, enlargement of a right lower lobe and possible enlargement of a right  middle lobe pulmonary nodule. Findings are suspicious for metastatic disease. 2. No thoracic adenopathy. 3. Coronary artery atherosclerosis. Aortic Atherosclerosis (ICD10-I70.0).   11/01/2021 -  Chemotherapy   Patient is on Treatment Plan : PANCREAS FOLFIRI q14d        INTERVAL HISTORY:  Brandon Sherman is here for a follow up of cholangiocarcinoma. He was last seen by me on 12/26/21. He presents to the clinic alone. He reports he is doing well overall, no new concerns since last visit. He notes the neuropathy in his fingertips seems to be improving.   All other systems were reviewed with the patient and are negative.  MEDICAL HISTORY:  Past Medical History:  Diagnosis Date   Arthritis    Diabetes ([place])    GERD (gastroesophageal reflux disease)    Hyperlipidemia    Hypertension    Intrahepatic cholangiocarcinoma ([place])     SURGICAL HISTORY: Past Surgical History:  Procedure Laterality Date   APPENDECTOMY  1980   CARDIOVERSION N/A 04/27/2020   Procedure: CARDIOVERSION;  Surgeon: Dorothy Spark, MD;  Location: [facility] ENDOSCOPY;  Service: Cardiovascular;  Laterality: N/A;   CARDIOVERSION N/A 05/19/2020   Procedure: CARDIOVERSION;  Surgeon: Elouise Munroe, MD;  Location: [facility];  Service: Cardiovascular;  Laterality: N/A;   COLONOSCOPY     IR 3D INDEPENDENT [facility]KST  08/11/2020   IR 3D INDEPENDENT [facility]KST  08/11/2020   IR ANGIOGRAM SELECTIVE EACH ADDITIONAL VESSEL  08/11/2020   IR ANGIOGRAM SELECTIVE EACH ADDITIONAL VESSEL  08/11/2020   IR ANGIOGRAM SELECTIVE EACH ADDITIONAL VESSEL  08/11/2020   IR [place]  ADDITIONAL VESSEL  08/11/2020   IR ANGIOGRAM SELECTIVE EACH ADDITIONAL VESSEL  08/11/2020   IR ANGIOGRAM SELECTIVE EACH ADDITIONAL VESSEL  08/11/2020   IR ANGIOGRAM SELECTIVE EACH ADDITIONAL VESSEL  08/26/2020   IR ANGIOGRAM SELECTIVE EACH ADDITIONAL VESSEL  08/26/2020   IR ANGIOGRAM VISCERAL SELECTIVE  08/11/2020   IR ANGIOGRAM VISCERAL SELECTIVE  08/26/2020   IR  EMBO ARTERIAL NOT HEMORR HEMANG INC GUIDE ROADMAPPING  08/11/2020   IR EMBO TUMOR ORGAN ISCHEMIA INFARCT INC GUIDE ROADMAPPING  08/26/2020   IR IMAGING GUIDED PORT INSERTION  02/23/2020   IR RADIOLOGIST EVAL & MGMT  07/14/2020   IR RADIOLOGIST EVAL & MGMT  09/30/2020   IR RADIOLOGIST EVAL & MGMT  12/01/2020   IR US GUIDE VASC ACCESS LEFT  08/26/2020   IR US GUIDE VASC ACCESS RIGHT  08/11/2020    I have reviewed the social history and family history with the patient and they are unchanged from previous note.  ALLERGIES:  has No Known Allergies.  MEDICATIONS:  Current Outpatient Medications  Medication Sig Dispense Refill   allopurinol (ZYLOPRIM) 100 MG tablet Take 100 mg by mouth daily.     colchicine 0.6 MG tablet Take 1 tablet (0.6 mg total) by mouth daily as needed (gout flare). Take daily for 3 days, then as needed for gout flare 30 tablet 0   diltiazem (CARDIZEM CD) 240 MG 24 hr capsule TAKE 1 CAPSULE BY MOUTH EVERY DAY, STOP AMLODIPINE 90 capsule 3   ELIQUIS 5 MG TABS tablet TAKE 1 TABLET TWICE DAILY 180 tablet 1   fenofibrate (TRICOR) 145 MG tablet Take 145 mg by mouth daily.     finasteride (PROSCAR) 5 MG tablet Take 5 mg by mouth daily.     furosemide (LASIX) 40 MG tablet TAKE 2 TABLETS (80 MG TOTAL) BY MOUTH 2 (TWO) TIMES DAILY. 360 tablet 3   gabapentin (NEURONTIN) 100 MG capsule Take 100 mg by mouth at bedtime.     hydrALAZINE (APRESOLINE) 50 MG tablet Take 1 tablet (50 mg total) by mouth 2 (two) times daily. 180 tablet 2   KLOR-CON M20 20 MEQ tablet TAKE 1 TABLET BY MOUTH TWICE A DAY 180 tablet 3   lidocaine-prilocaine (EMLA) cream Apply 1 application topically as needed. 30 g 3   magnesium oxide (MAG-OX) 400 (240 Mg) MG tablet TAKE 1 TABLET BY MOUTH EVERY DAY 30 tablet 1   metFORMIN (GLUCOPHAGE-XR) 500 MG 24 hr tablet      metoprolol succinate (TOPROL XL) 25 MG 24 hr tablet Take 1 tablet (25 mg total) by mouth daily. 90 tablet 3   metoprolol tartrate (LOPRESSOR) 50 MG tablet       ondansetron (ZOFRAN) 8 MG tablet TAKE 1 TABLET BY MOUTH TWICE A DAY AS NEEDED START ON 3RD DAY AFTER CHEMOTHERAPY (Patient not taking: Reported on 11/16/2021) 30 tablet 1   pantoprazole (PROTONIX) 40 MG tablet TAKE 1 TABLET BY MOUTH TWICE A DAY 180 tablet 2   pravastatin (PRAVACHOL) 40 MG tablet Take 40 mg by mouth daily.     sucralfate (CARAFATE) 1 g tablet Take 1 tablet (1 g total) by mouth every 6 (six) hours as needed. Please schedule a yearly follow up for further refills: [phone] (Patient not taking: Reported on 10/10/2021) 60 tablet 0   tamsulosin (FLOMAX) 0.4 MG CAPS capsule Take 0.4 mg by mouth daily.     No current facility-administered medications for this visit.    PHYSICAL EXAMINATION: ECOG PERFORMANCE STATUS: 1 - Symptomatic but completely  ambulatory  Vitals:   01/09/22 0919  BP: (!) 131/56  Pulse: 63  Resp: 19  Temp: 98 F (36.7 C)  SpO2: 100%   Wt Readings from Last 3 Encounters:  01/09/22 221 lb 4.8 oz (100.4 kg)  12/26/21 222 lb 3.2 oz (100.8 kg)  12/12/21 219 lb 8 oz (99.6 kg)     GENERAL:alert, no distress and comfortable SKIN: skin color normal, no rashes or significant lesions EYES: normal, Conjunctiva are pink and non-injected, sclera clear  NEURO: alert & oriented x 3 with fluent speech  LABORATORY DATA:  I have reviewed the data as listed CBC Latest Ref Rng & Units 01/09/2022 12/26/2021 12/12/2021  WBC 4.0 - 10.5 K/uL 6.4 6.9 7.5  Hemoglobin 13.0 - 17.0 g/dL 9.8(L) 10.0(L) 9.9(L)  Hematocrit 39.0 - 52.0 % 30.8(L) 31.6(L) 31.1(L)  Platelets 150 - 400 K/uL 101(L) 109(L) 110(L)     CMP Latest Ref Rng & Units 12/26/2021 12/12/2021 11/28/2021  Glucose 70 - 99 mg/dL 172(H) 174(H) 148(H)  BUN 8 - 23 mg/dL _0 Creatinine 0.61 - 1.24 mg/dL 1.45(H) 1.51(H) 1.64(H)  Sodium 135 - 145 mmol/L 137 137 137  Potassium 3.5 - 5.1 mmol/L 4.2 4.3 3.9  Chloride 98 - 111 mmol/L 107 107 106  CO2 22 - 32 mmol/L _1 Calcium 8.9 - 10.3 mg/dL 9.4 9.5 9.4  Total  Protein 6.5 - 8.1 g/dL 6.6 6.6 6.8  Total Bilirubin 0.3 - 1.2 mg/dL 0.4 0.4 0.4  Alkaline Phos 38 - 126 U/L 107 105 114  AST 15 - 41 U/L _2 ALT 0 - 44 U/L _3 RADIOGRAPHIC STUDIES: I have personally reviewed the radiological images as listed and agreed with the findings in the report. No results found.    No orders of the defined types were placed in this encounter.  All questions were answered. The patient knows to call the clinic with any problems, questions or concerns. No barriers to learning was detected. The total time spent in the appointment was 30 minutes.     [name], MD 01/09/2022   I, Wilburn Mylar, am acting as scribe for [name], MD.   I have reviewed the above documentation for accuracy and completeness, and I agree with the above.

## 2022-01-10 ENCOUNTER — Telehealth: Payer: Self-pay | Admitting: Hematology

## 2022-01-10 NOTE — Telephone Encounter (Signed)
Left message with follow-up appointments per 2/20 los. °

## 2022-01-11 ENCOUNTER — Other Ambulatory Visit: Payer: Self-pay

## 2022-01-11 ENCOUNTER — Inpatient Hospital Stay: Payer: Medicare HMO

## 2022-01-11 VITALS — BP 136/68 | HR 52 | Temp 98.9°F | Resp 20

## 2022-01-11 DIAGNOSIS — C221 Intrahepatic bile duct carcinoma: Secondary | ICD-10-CM | POA: Diagnosis not present

## 2022-01-11 DIAGNOSIS — Z5189 Encounter for other specified aftercare: Secondary | ICD-10-CM | POA: Diagnosis not present

## 2022-01-11 DIAGNOSIS — Z7189 Other specified counseling: Secondary | ICD-10-CM

## 2022-01-11 DIAGNOSIS — Z5111 Encounter for antineoplastic chemotherapy: Secondary | ICD-10-CM | POA: Diagnosis not present

## 2022-01-11 MED ORDER — HEPARIN SOD (PORK) LOCK FLUSH 100 UNIT/ML IV SOLN
500.0000 [IU] | Freq: Once | INTRAVENOUS | Status: AC | PRN
  Administered 2022-01-11: 500 [IU]

## 2022-01-11 MED ORDER — SODIUM CHLORIDE 0.9% FLUSH
10.0000 mL | INTRAVENOUS | Status: DC | PRN
  Administered 2022-01-11: 10 mL

## 2022-01-11 MED ORDER — PEGFILGRASTIM-CBQV 6 MG/0.6ML ~~LOC~~ SOSY
6.0000 mg | PREFILLED_SYRINGE | Freq: Once | SUBCUTANEOUS | Status: AC
  Administered 2022-01-11: 6 mg via SUBCUTANEOUS
  Filled 2022-01-11: qty 0.6

## 2022-01-19 ENCOUNTER — Other Ambulatory Visit: Payer: Self-pay

## 2022-01-19 ENCOUNTER — Ambulatory Visit (HOSPITAL_COMMUNITY)
Admission: RE | Admit: 2022-01-19 | Discharge: 2022-01-19 | Disposition: A | Discharge status: Home / Self Care | Payer: Medicare HMO | Source: Ambulatory Visit | Attending: Hematology | Admitting: Hematology

## 2022-01-19 ENCOUNTER — Encounter (HOSPITAL_COMMUNITY): Payer: Self-pay

## 2022-01-19 DIAGNOSIS — C221 Intrahepatic bile duct carcinoma: Secondary | ICD-10-CM | POA: Diagnosis not present

## 2022-01-19 DIAGNOSIS — K7689 Other specified diseases of liver: Secondary | ICD-10-CM | POA: Diagnosis not present

## 2022-01-19 DIAGNOSIS — N281 Cyst of kidney, acquired: Secondary | ICD-10-CM | POA: Diagnosis not present

## 2022-01-19 DIAGNOSIS — K8020 Calculus of gallbladder without cholecystitis without obstruction: Secondary | ICD-10-CM | POA: Diagnosis not present

## 2022-01-19 DIAGNOSIS — R911 Solitary pulmonary nodule: Secondary | ICD-10-CM | POA: Diagnosis not present

## 2022-01-19 IMAGING — MR MR ABDOMEN WO/W CM
18 series · 48 of 48 positions shown · IV contrast (gadavist)
Comparison: MRI abdomen [DATE]

CLINICAL DATA: Follow-up cholangiocarcinoma

EXAM:
MRI ABDOMEN WITHOUT AND WITH CONTRAST
TECHNIQUE: Multiplanar multisequence MR imaging of the abdomen was performed
both before and after the administration of intravenous contrast.
CONTRAST:  10mL GADAVIST GADOBUTROL 1 MMOL/ML IV SOLN

[Series 3: DWI · axial · 6.0mm · 1.49mm/px · z∈[-120,+168]mm · 4 of 82 slices shown (1 of 2)]
[im 1/82]
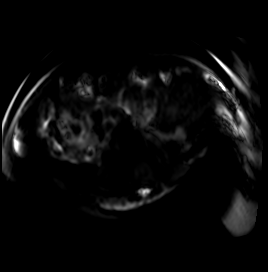
[im 28/82]
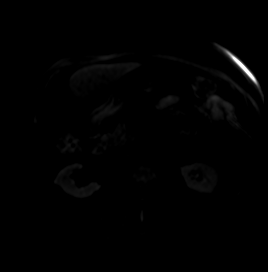
[im 55/82]
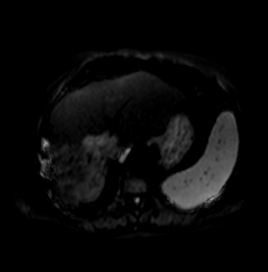
[im 82/82]
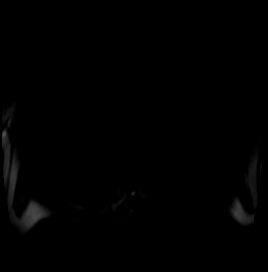

[Series 4: DWI · axial · 6.0mm · 1.49mm/px · z∈[-120,+168]mm · 2 of 41 slices shown (2 of 2)]
[im 1/41]
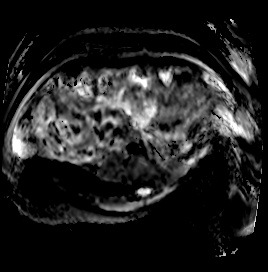
[im 41/41]
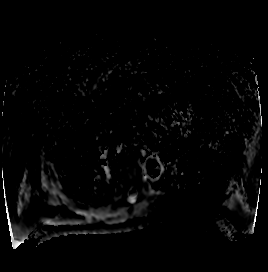

[Series 5: T2 fat-sat · axial · 6.0mm · 1.25mm/px · z∈[-118,+163]mm · 2 of 40 slices shown]
[im 1/40]
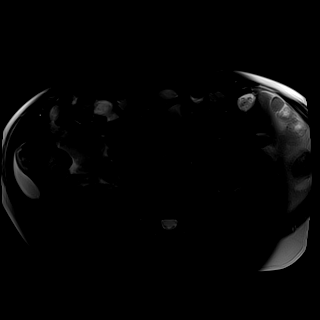
[im 40/40]
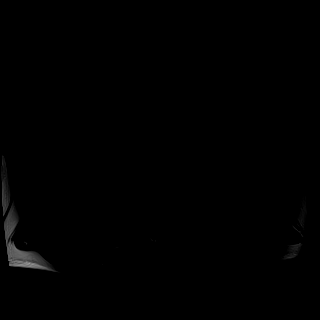

[Series 8: T2 · coronal · 6.0mm · 1.56mm/px · 2 of 41 slices shown (1 of 2)]
[im 1/41]
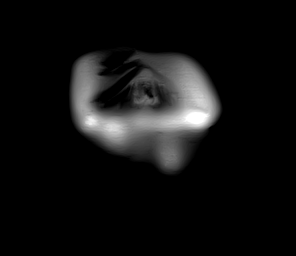
[im 41/41]
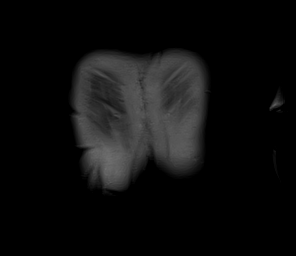

[Series 9: T1 · axial · 3.0mm · 1.25mm/px · z∈[-118,+143]mm · 3 of 88 slices shown (1 of 2)]
[im 1/88]
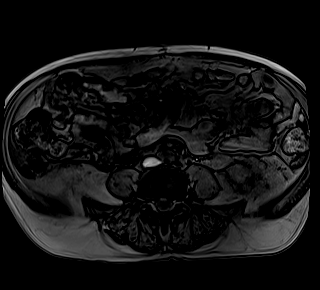
[im 44/88]
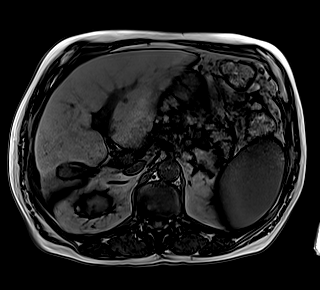
[im 88/88]
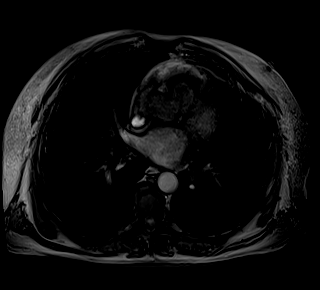

[Series 10: T1 · axial · 3.0mm · 1.25mm/px · z∈[-118,+143]mm · 3 of 88 slices shown (2 of 2)]
[im 1/88]
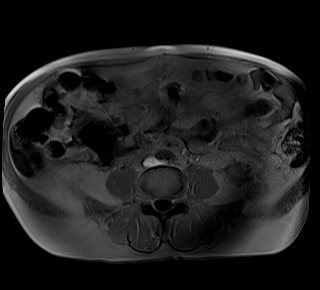
[im 44/88]
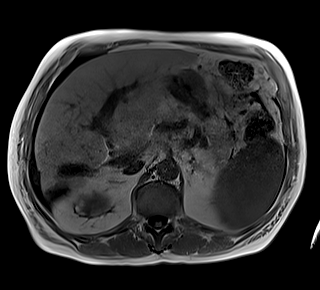
[im 88/88]
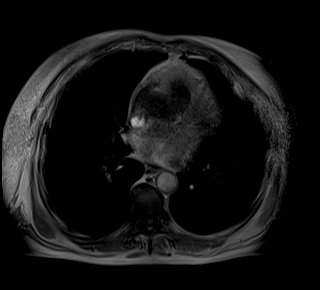

[Series 11: bSSFP · axial · 4.0mm · 0.84mm/px · z∈[-111,+153]mm · 2 of 67 slices shown]
[im 1/67]
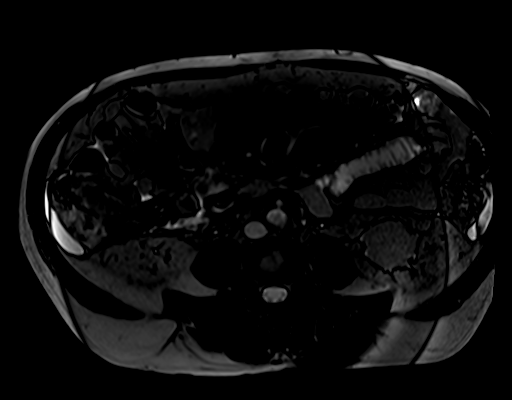
[im 67/67]
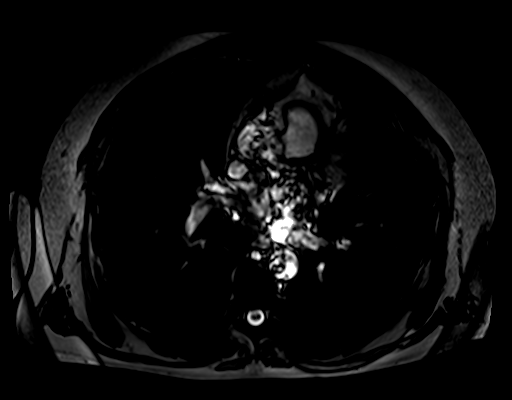

[Series 13: T1 dynamic · axial · 3.0mm · 1.25mm/px · z∈[-116,+145]mm · 3 of 88 slices shown (1 of 10)]
[im 1/88]
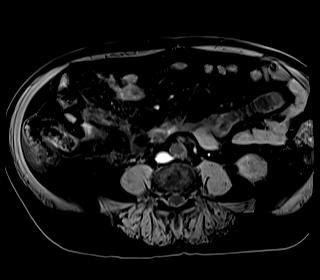
[im 44/88]
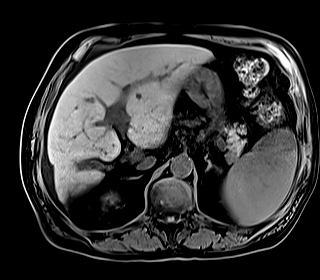
[im 88/88]
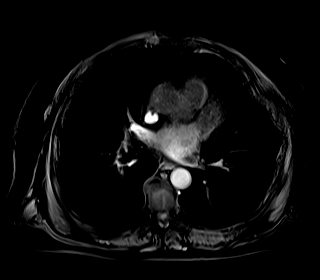

[Series 17: T1 dynamic · axial · 3.0mm · 1.25mm/px · z∈[-116,+145]mm · 3 of 88 slices shown (2 of 10)]
[im 1/88]
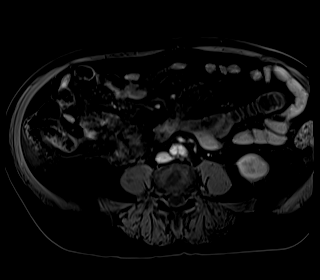
[im 44/88]
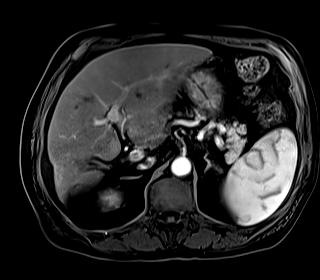
[im 88/88]
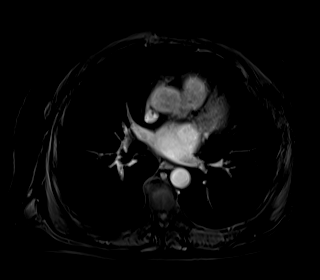

[Series 18: T1 dynamic · axial · 3.0mm · 1.25mm/px · z∈[-116,+145]mm · 3 of 88 slices shown (3 of 10)]
[im 1/88]
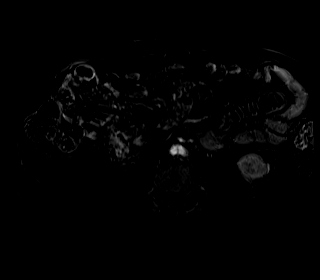
[im 44/88]
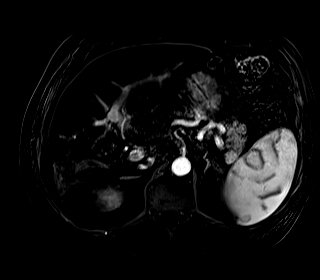
[im 88/88]
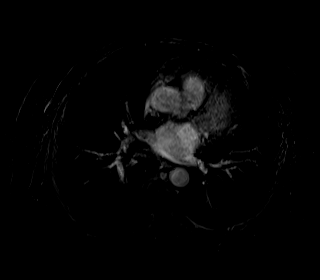

[Series 21: T1 dynamic · axial · 3.0mm · 1.25mm/px · z∈[-116,+145]mm · 3 of 88 slices shown (4 of 10)]
[im 1/88]
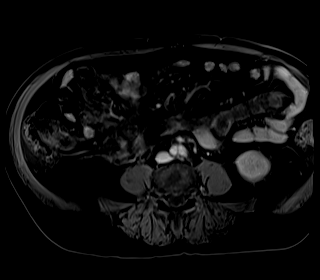
[im 44/88]
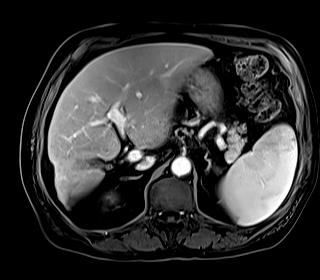
[im 88/88]
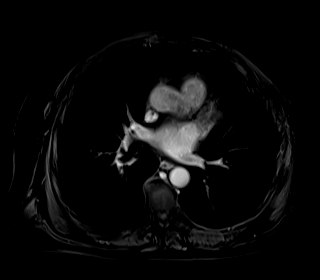

[Series 22: T1 dynamic · axial · 3.0mm · 1.25mm/px · z∈[-116,+145]mm · 3 of 88 slices shown (5 of 10)]
[im 1/88]
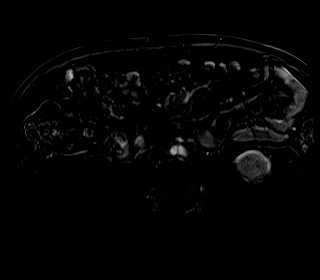
[im 44/88]
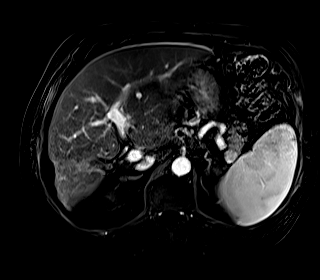
[im 88/88]
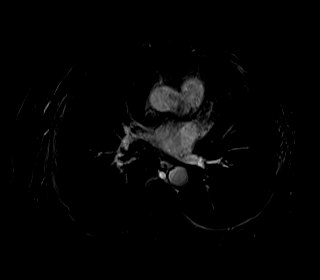

[Series 25: T1 dynamic · axial · 3.0mm · 1.25mm/px · z∈[-116,+145]mm · 3 of 88 slices shown (6 of 10)]
[im 1/88]
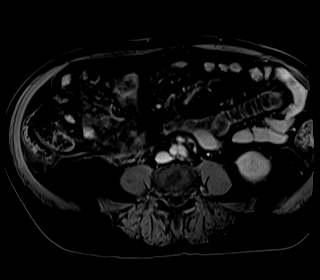
[im 44/88]
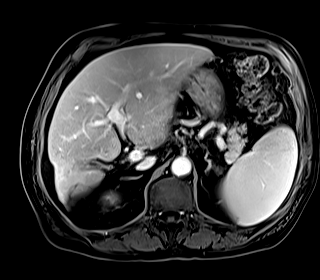
[im 88/88]
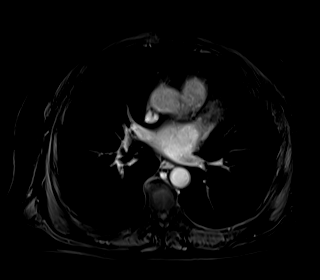

[Series 26: T1 dynamic · axial · 3.0mm · 1.25mm/px · z∈[-116,+145]mm · 3 of 88 slices shown (7 of 10)]
[im 1/88]
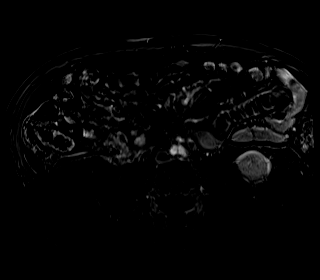
[im 44/88]
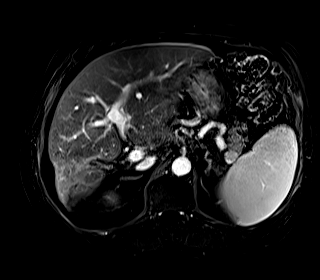
[im 88/88]
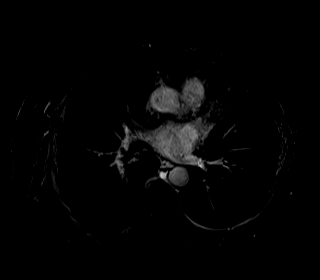

[Series 28: T1 dynamic · coronal · 5.0mm · 1.41mm/px · 2 of 60 slices shown (8 of 10)]
[im 1/60]
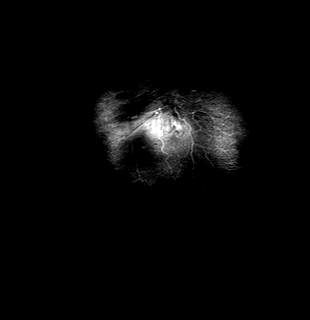
[im 60/60]
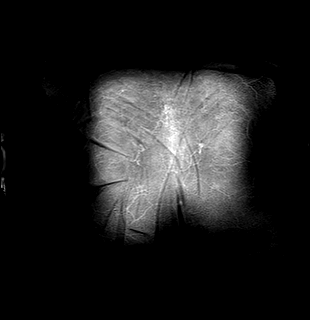

[Series 29: T2 · axial · 6.0mm · 1.56mm/px · 1 of 39 slices shown (2 of 2)]
[im 1/39]
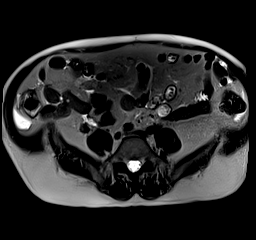

[Series 32: T1 dynamic · axial · 3.0mm · 1.25mm/px · z∈[-116,+145]mm · 3 of 88 slices shown (9 of 10)]
[im 1/88]
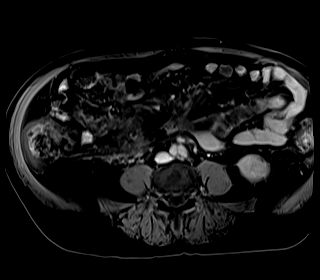
[im 44/88]
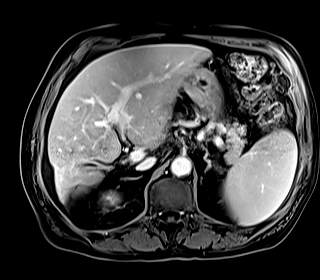
[im 88/88]
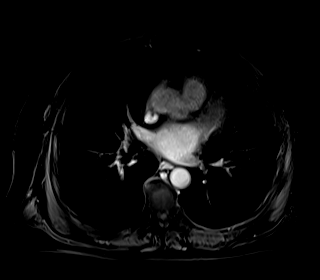

[Series 33: T1 dynamic · axial · 3.0mm · 1.25mm/px · z∈[-116,+145]mm · 3 of 88 slices shown (10 of 10)]
[im 1/88]
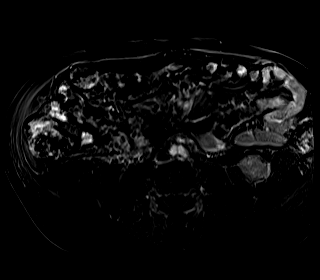
[im 44/88]
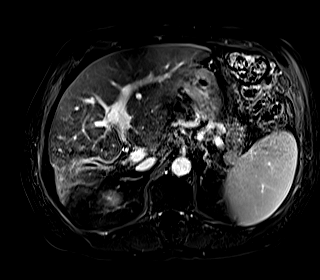
[im 88/88]
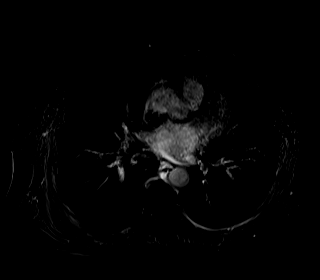

[48 of 48 positions shown; findings below may reference images not displayed]

FINDINGS: Lower chest: Redemonstration of a 9 mm pulmonary nodule in the right
lower lobe.

Hepatobiliary: Grossly stable size and appearance of the large area
of heterogeneous enhancement again centered in the right hepatic
lobe segment 6, 7 and 8. Including similar-appearing peripherally
enhancing necrotic area medially, and capsular retraction. The
entire area on the arterial phase measures approximately 10 x 9.4 cm
in axial dimensions. There is interval enlargement of a peripherally
enhancing mass lesion in the right hepatic lobe near the dome
segment 8 now measuring 1.9 cm compared to 1.2 cm previously. No new
satellite lesions identified. Gallbladder is somewhat contracted
with mild wall thickening and contains a stone. No biliary ductal
dilatation identified.

Pancreas: No mass, inflammatory changes, or other parenchymal
abnormality identified.

Spleen:  Enlarged measuring 14.3 cm in length.

Adrenals/Urinary Tract: Adrenal glands appear normal. A few
subcentimeter renal cortical cysts. No hydronephrosis.

Stomach/Bowel: No evidence of bowel obstruction.

Vascular/Lymphatic: No pathologically enlarged lymph nodes
identified. No abdominal aortic aneurysm demonstrated. Grossly
similar-appearing tumor involving the right portal vein, hepatic
vein and partially in the IVC.

Other:  Small volume ascites, mostly perisplenic and perihepatic.

Musculoskeletal: No suspicious bone lesions identified.
IMPRESSION: 1. No significant change in size or appearance of the large
heterogeneous enhancing mass area in the right hepatic lobe as
described. There is mild interval enlargement of the adjacent
satellite lesion near the dome of the right lobe now measuring
cm.
2. Redemonstration of a pulmonary nodule in the right lower lobe
measuring 9 mm.
3. Small volume ascites.
4. Splenomegaly.
5. Cholelithiasis.

## 2022-01-19 IMAGING — CT CT CHEST W/O CM
2 of 4 series · 15 of 36 positions shown, 18 images · non-contrast
Comparison: [DATE].

CLINICAL DATA: Follow-up lung nodule. History of
cholangiocarcinoma.



[Series 2: thorax · axial · 0.90mm/px · z∈[-132,+150]mm · 12 of 167 slices shown, 15 images]
[im 13/167  mediastinal]
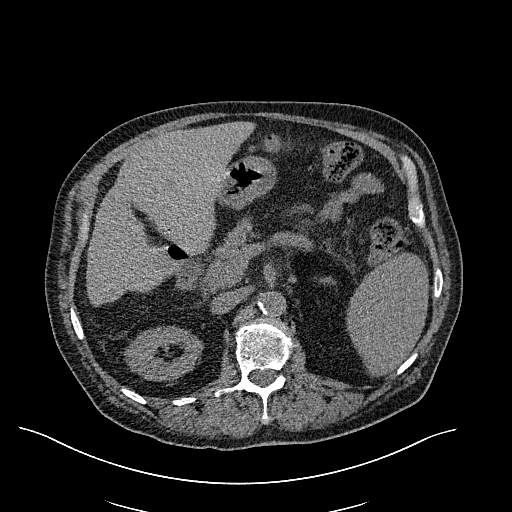
[im 13/167  lung]
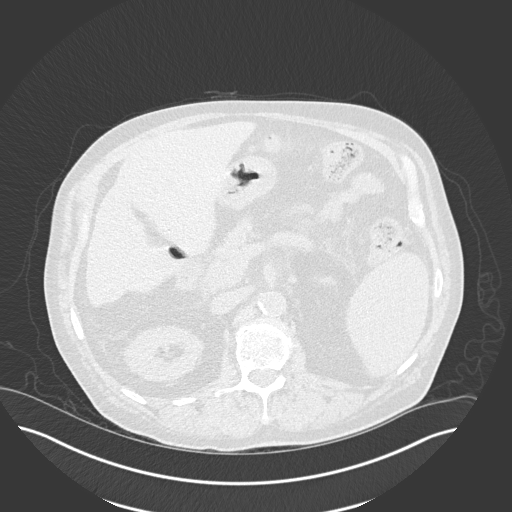
[im 26/167  lung]
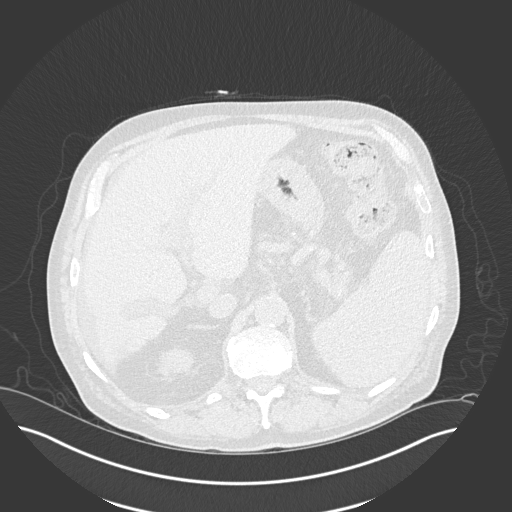
[im 39/167  lung]
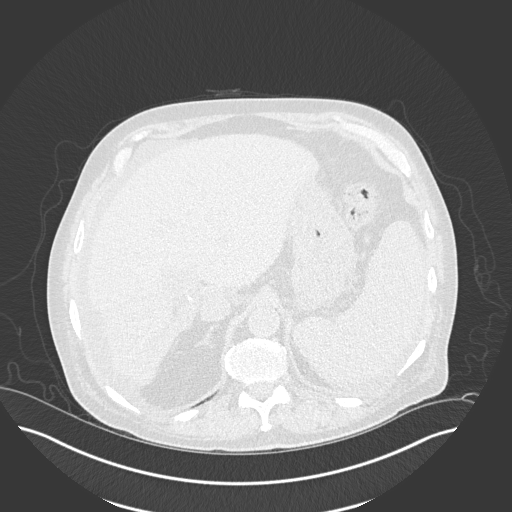
[im 52/167  lung]
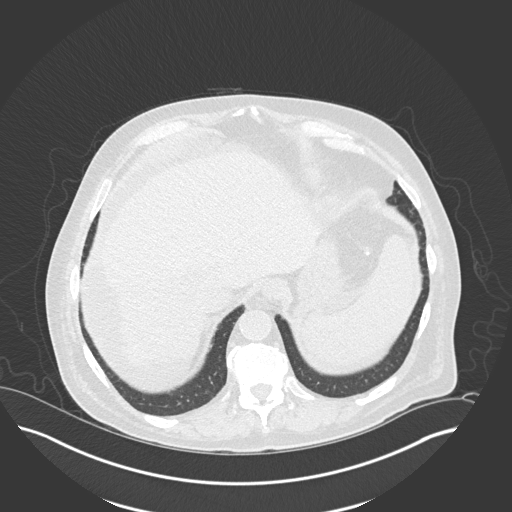
[im 64/167  mediastinal]
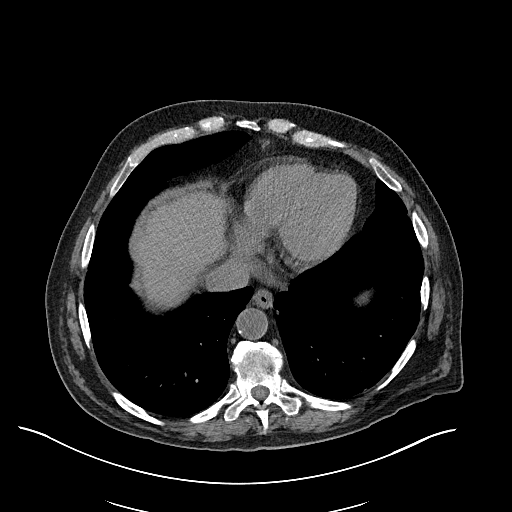
[im 64/167  lung]
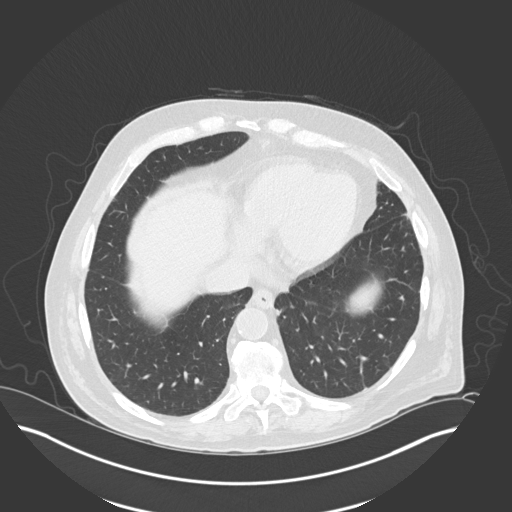
[im 77/167  lung]
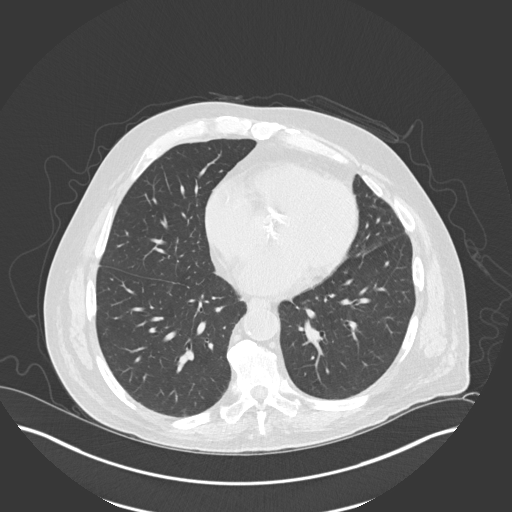
[im 90/167  lung]
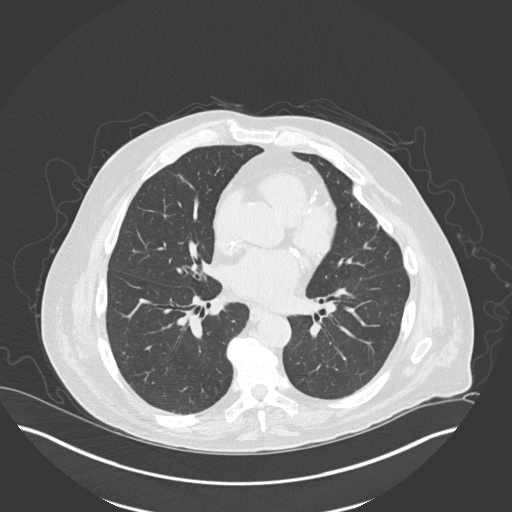
[im 103/167  lung]
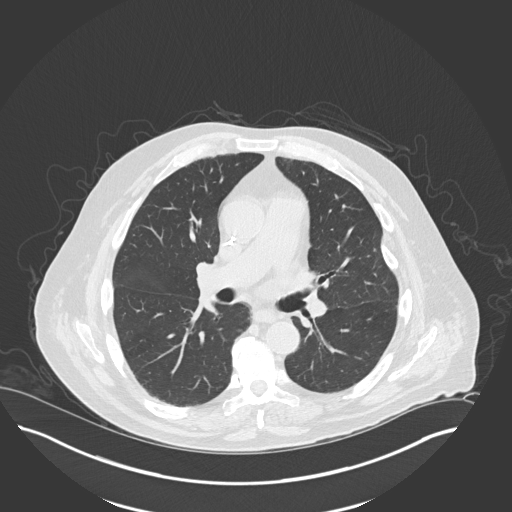
[im 115/167  mediastinal]
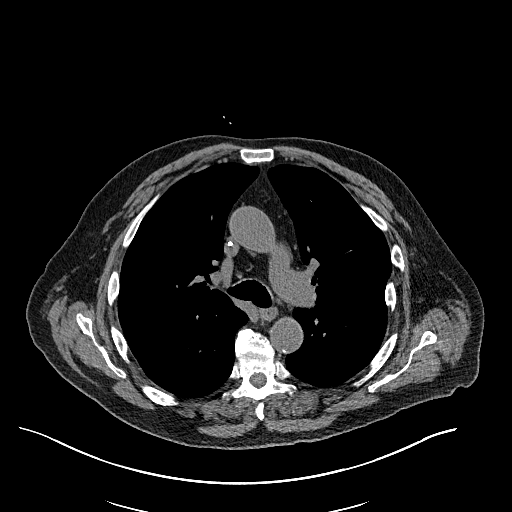
[im 115/167  lung]
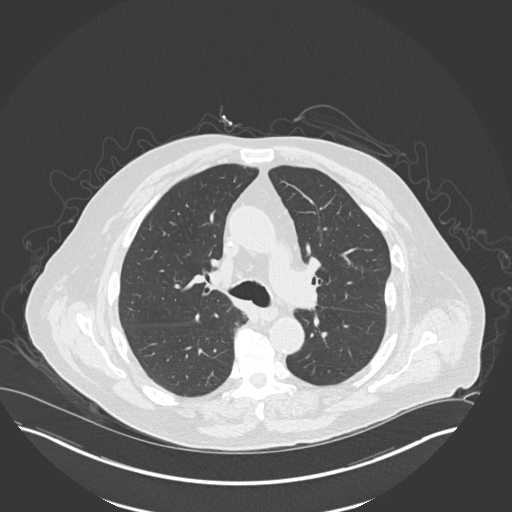
[im 128/167  lung]
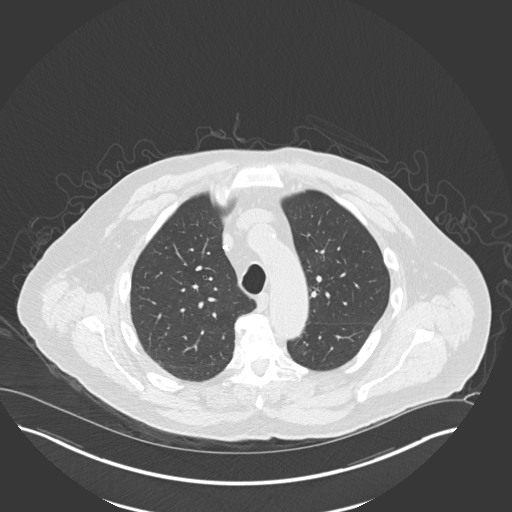
[im 141/167  lung]
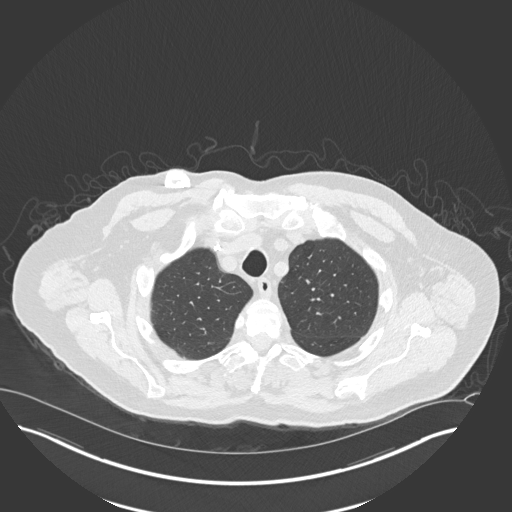
[im 154/167  lung]
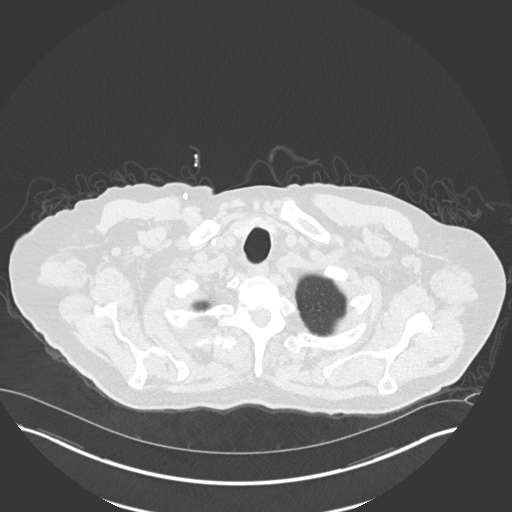

[Series 6: coronal · coronal · 0.70mm/px · 3 of 159 slices shown]
[im 32/159  lung]
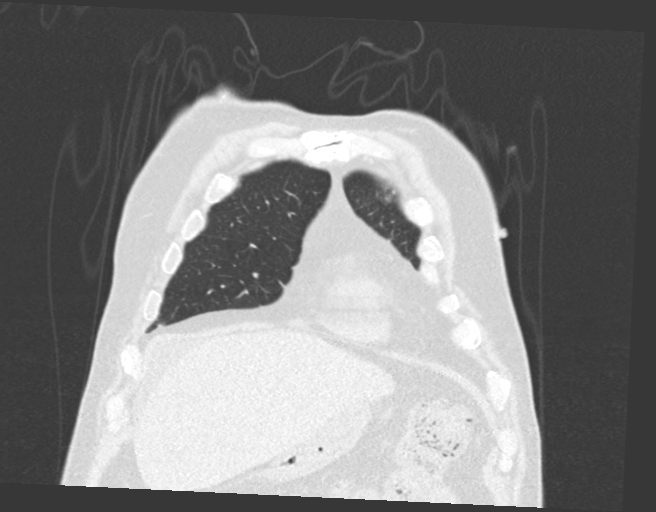
[im 64/159  lung]
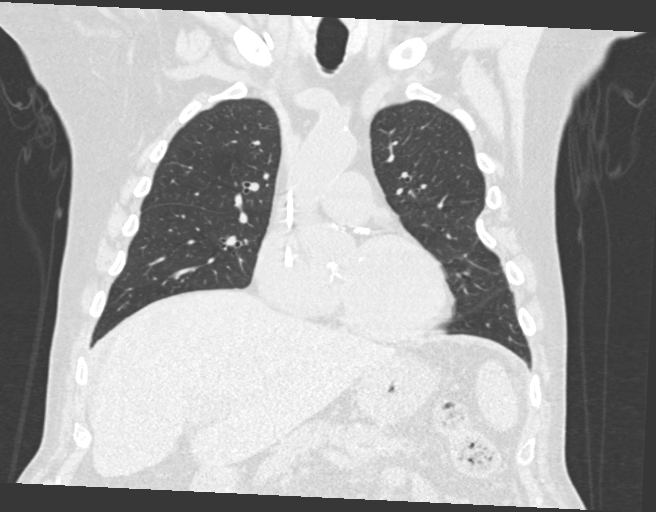
[im 95/159  lung]
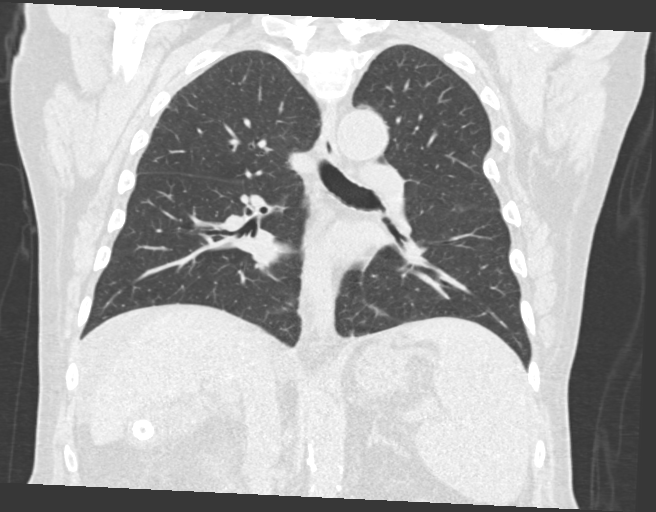

[15 of 36 positions shown; findings below may reference images not displayed]

FINDINGS: Cardiovascular: Heart size is within normal limits. No pericardial
effusion. Aortic atherosclerosis and coronary artery calcifications.

Mediastinum/Nodes: No enlarged mediastinal or axillary lymph nodes.
Thyroid gland, trachea, and esophagus demonstrate no significant
findings.

Lungs/Pleura: No pleural effusion. Nodule within the posterior right
lower lobe measures 1 cm, image 109/5. Formally 8 mm. On the exam
from [DATE] this measured 4 mm.

Superior segment of right lower lobe lung nodule measures 8 mm,
image 68/5. Unchanged from previous exam.

Right middle lobe lung nodule measures 4 mm, image 80/5. Previously
this measured the same.

Pleural base soft tissue thickening within the posteromedial left
lower lobe is unchanged, image 103/5.

Upper Abdomen: Deferred to today's abdominal MRI, dictated
separately. Cholelithiasis. Trace upper abdominal ascites. Old
granulomatous disease in the spleen.

Musculoskeletal: Remote left rib deformities are again noted, likely
reflecting old trauma. No acute or suspicious osseous findings.
Thoracic degenerative disc disease.
IMPRESSION: 1. Continued increase in size of lung nodule within the posterior
right lung base which now measures 1 cm and is suspicious for
metastatic disease. The remaining lung nodules are stable from
[DATE].
2. No thoracic adenopathy.
3. Aortic Atherosclerosis ([Z6]-[Z6]). Coronary artery
calcifications.

* onc *

## 2022-01-19 MED ORDER — GADOBUTROL 1 MMOL/ML IV SOLN
10.0000 mL | Freq: Once | INTRAVENOUS | Status: AC | PRN
  Administered 2022-01-19: 10 mL via INTRAVENOUS

## 2022-01-23 MED FILL — Dexamethasone Sodium Phosphate Inj 100 MG/10ML: INTRAMUSCULAR | Qty: 1 | Status: AC

## 2022-01-24 ENCOUNTER — Other Ambulatory Visit: Payer: Self-pay

## 2022-01-24 ENCOUNTER — Inpatient Hospital Stay: Payer: Medicare HMO

## 2022-01-24 ENCOUNTER — Encounter: Payer: Self-pay | Admitting: Hematology

## 2022-01-24 ENCOUNTER — Telehealth: Payer: Self-pay

## 2022-01-24 ENCOUNTER — Other Ambulatory Visit (HOSPITAL_COMMUNITY): Payer: Medicare HMO

## 2022-01-24 ENCOUNTER — Inpatient Hospital Stay: Payer: Medicare HMO | Attending: Hematology | Admitting: Hematology

## 2022-01-24 ENCOUNTER — Other Ambulatory Visit (HOSPITAL_COMMUNITY): Payer: Self-pay

## 2022-01-24 ENCOUNTER — Ambulatory Visit (HOSPITAL_COMMUNITY): Payer: Medicare HMO

## 2022-01-24 ENCOUNTER — Telehealth: Payer: Self-pay | Admitting: Pharmacist

## 2022-01-24 VITALS — BP 135/67 | HR 63 | Temp 98.1°F | Resp 18 | Ht 69.0 in | Wt 217.8 lb

## 2022-01-24 DIAGNOSIS — Z95828 Presence of other vascular implants and grafts: Secondary | ICD-10-CM | POA: Diagnosis not present

## 2022-01-24 DIAGNOSIS — Z7901 Long term (current) use of anticoagulants: Secondary | ICD-10-CM | POA: Insufficient documentation

## 2022-01-24 DIAGNOSIS — I509 Heart failure, unspecified: Secondary | ICD-10-CM | POA: Insufficient documentation

## 2022-01-24 DIAGNOSIS — Z7984 Long term (current) use of oral hypoglycemic drugs: Secondary | ICD-10-CM | POA: Diagnosis not present

## 2022-01-24 DIAGNOSIS — Z79899 Other long term (current) drug therapy: Secondary | ICD-10-CM | POA: Diagnosis not present

## 2022-01-24 DIAGNOSIS — C221 Intrahepatic bile duct carcinoma: Secondary | ICD-10-CM | POA: Diagnosis not present

## 2022-01-24 DIAGNOSIS — E1140 Type 2 diabetes mellitus with diabetic neuropathy, unspecified: Secondary | ICD-10-CM | POA: Diagnosis not present

## 2022-01-24 DIAGNOSIS — F17220 Nicotine dependence, chewing tobacco, uncomplicated: Secondary | ICD-10-CM | POA: Insufficient documentation

## 2022-01-24 DIAGNOSIS — I110 Hypertensive heart disease with heart failure: Secondary | ICD-10-CM | POA: Insufficient documentation

## 2022-01-24 DIAGNOSIS — M109 Gout, unspecified: Secondary | ICD-10-CM | POA: Diagnosis not present

## 2022-01-24 DIAGNOSIS — I4891 Unspecified atrial fibrillation: Secondary | ICD-10-CM | POA: Diagnosis not present

## 2022-01-24 LAB — CBC WITH DIFFERENTIAL (CANCER CENTER ONLY)
Abs Immature Granulocytes: 0.09 10*3/uL — ABNORMAL HIGH (ref 0.00–0.07)
Basophils Absolute: 0 10*3/uL (ref 0.0–0.1)
Basophils Relative: 1 %
Eosinophils Absolute: 0.1 10*3/uL (ref 0.0–0.5)
Eosinophils Relative: 1 %
HCT: 30.3 % — ABNORMAL LOW (ref 39.0–52.0)
Hemoglobin: 9.5 g/dL — ABNORMAL LOW (ref 13.0–17.0)
Immature Granulocytes: 2 %
Lymphocytes Relative: 9 %
Lymphs Abs: 0.5 10*3/uL — ABNORMAL LOW (ref 0.7–4.0)
MCH: 31.6 pg (ref 26.0–34.0)
MCHC: 31.4 g/dL (ref 30.0–36.0)
MCV: 100.7 fL — ABNORMAL HIGH (ref 80.0–100.0)
Monocytes Absolute: 0.6 10*3/uL (ref 0.1–1.0)
Monocytes Relative: 10 %
Neutro Abs: 4.4 10*3/uL (ref 1.7–7.7)
Neutrophils Relative %: 77 %
Platelet Count: 102 10*3/uL — ABNORMAL LOW (ref 150–400)
RBC: 3.01 MIL/uL — ABNORMAL LOW (ref 4.22–5.81)
RDW: 17.9 % — ABNORMAL HIGH (ref 11.5–15.5)
WBC Count: 5.6 10*3/uL (ref 4.0–10.5)
nRBC: 0.4 % — ABNORMAL HIGH (ref 0.0–0.2)

## 2022-01-24 LAB — CMP (CANCER CENTER ONLY)
ALT: 31 U/L (ref 0–44)
AST: 33 U/L (ref 15–41)
Albumin: 3.8 g/dL (ref 3.5–5.0)
Alkaline Phosphatase: 98 U/L (ref 38–126)
Anion gap: 7 (ref 5–15)
BUN: 16 mg/dL (ref 8–23)
CO2: 25 mmol/L (ref 22–32)
Calcium: 9.2 mg/dL (ref 8.9–10.3)
Chloride: 105 mmol/L (ref 98–111)
Creatinine: 1.45 mg/dL — ABNORMAL HIGH (ref 0.61–1.24)
GFR, Estimated: 51 mL/min — ABNORMAL LOW (ref >60)
Glucose, Bld: 221 mg/dL — ABNORMAL HIGH (ref 70–99)
Potassium: 3.7 mmol/L (ref 3.5–5.1)
Sodium: 137 mmol/L (ref 135–145)
Total Bilirubin: 0.5 mg/dL (ref 0.3–1.2)
Total Protein: 6.5 g/dL (ref 6.5–8.1)

## 2022-01-24 LAB — MAGNESIUM: Magnesium: 1.9 mg/dL (ref 1.7–2.4)

## 2022-01-24 MED ORDER — HEPARIN SOD (PORK) LOCK FLUSH 100 UNIT/ML IV SOLN
500.0000 [IU] | Freq: Once | INTRAVENOUS | Status: AC
  Administered 2022-01-24: 500 [IU] via INTRAVENOUS

## 2022-01-24 MED ORDER — SODIUM CHLORIDE 0.9% FLUSH
10.0000 mL | INTRAVENOUS | Status: DC | PRN
  Administered 2022-01-24: 10 mL via INTRAVENOUS

## 2022-01-24 MED ORDER — TIBSOVO 250 MG PO TABS
500.0000 mg | ORAL_TABLET | Freq: Every day | ORAL | 0 refills | Status: DC
  Filled 2022-01-24: qty 60, fill #0
  Filled 2022-01-31: qty 60, 30d supply, fill #0

## 2022-01-24 NOTE — Progress Notes (Addendum)
[place]   [phone] [phone]   Clinic Follow up Note   Patient Care Team: [name], PA as PCP - General (Family Medicine) Lorretta Harp, MD as PCP - Cardiology (Cardiology) [name], MD as Consulting Physician (General Surgery) [name], [name], MD as Consulting Physician (Gastroenterology) [name], MD as Consulting Physician (Hematology) Lorretta Harp, MD as Consulting Physician (Cardiology)  Date of Service:  01/24/2022  CHIEF COMPLAINT: f/u of cholangiocarcinoma  CURRENT THERAPY:  FOLFIRI q14d, started on 11/01/21  ASSESSMENT & PLAN:  Brandon Sherman is a 76 y.o. male with   1. Intrahepatic cholangiocarcinoma, cT2N0Mx, unresectable, with indeterminate lung nodules, IDH mutation (+) -Diagnosed in 07/2019. CT scans and MRI liver show a large 7.3cm mass in the right hepatic lobe which abuts portal vein. Both our local surgeon Dr. Barry Dienes and Dr Carlis Abbott at [facility] concluded that cancer is not resectable, and therefore not likely curable, due to the invasion to portal vein. -He began palliative systemic treatment standard first line chemo with IV Cisplatin and Gemcitabine 2 weeks on/1 week off. Started 09/29/19, tolerating well cisplatin was held with cycle 9 on 03/23/20 due to fluid status/A. fib, continued with maintenance gemcitabine through 07/19/20 -His FO results showed MSI stable disease, IDH1 mutations he may benefit from IDH inhibitor in the future -he restarted single agent gemcitabine q2 weeks on 12/24/20 until disease progression in liver on 03/16/21 MRI with concern for new liver lesion  -Started second line FOLFOX q2 weeks plus durvalumab q4 weeks starting 04/05/21. Dose reduced from cycle 8 due to cytopenias. Discontinued due to CT chest and abdomen MRI on 10/21/21 -changed to third line FOLFIRI started on 11/01/21, dose reduced several times for cytopenias. -Restaging CT chest and MRI abdomen on 01/19/22 again showed  progression in the lungs and liver. I reviewed the images and discussed the results with them today. -we discussed changing treatment again today. My recommendation is for oral ivosidenib due to [facility] mutation in his tumor. I reviewed the indications and side effects, especially nausea, low appetite, diarrhea, skin rash, fatigue, arthralgia, mucositis myalgia, etc. due to the impact on QT, I will get a baseline EKG today.  It has interaction with his Cardizem, I will reach out to cardiologist to see if they can change it to something else.  I also provided them with written material on the medicine.  I will or pharmacist [name] will do chemo education.    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 -improving, not discussed today  3. Goal of care discussion  -We again discussed the incurable nature of his cancer, and the overall poor prognosis, especially if he does not have good response to chemotherapy or progress on chemo -The patient understands the goal of care is palliative. -I recommend DNR/DNI. They believe they have a living will already in place; they will let us know.   4. Afib, CHF -diagnosed after PAC placement on 02/23/20. -CHADS2 score 2, moderate risk for stroke. Started on metoprolol and eliquis on 03/01/20. Tolerating well without bleeding.  -He had cardioversion on 05/19/20 -I will reach out to his cardiologist, Dr. Gwenlyn Found, about the ivosidenib. -We also obtained an EKG today.   5. DM, HTN, HLD, Gout -On Metformin, amlodipine, lisinopril, lasix, allopurinol -Continue to f/u with his PCP  -I previously reduced lasix to 1 tab BID rather than 2 tabs BID -monitoring BP  BG °  °6. Nicotine Use °-He never smoked but has been chewing Tobacco for the past 60 years. He no longer drinks alcohol and tells me he has quit tobacco use (11/25/19).  °  °7. GERD and gastritis, history of esophageal  candidiasis °-He had repeated EGD with Dr. [name] in 07/2019. 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 May repeat EGD in future  °-GERD resolved on PPI and carafate °  °  °Plan: °-Lab and scan reviewed, unfortunately he has disease progression.  We will stop chemo FOLFIRI.   °-I will reach out to cardiology about the drug drug interaction between ivosidenib and Cardizem, to see if they can switch Cardizem to other cardiac medication. °-I prescribed ivosidenib today, he will start when he receives it. °-f/u in 3 weeks with lab  ° ° °No problem-specific Assessment & Plan notes found for this encounter. ° ° °SUMMARY OF ONCOLOGIC HISTORY: °Oncology History Overview Note  °Cancer Staging °Intrahepatic cholangiocarcinoma (HCC) °Staging form: Intrahepatic Bile Duct, AJCC 8th Edition °- Clinical stage from 08/19/2019: Stage II (cT2, cN0, cM0) - Signed by Feng, Yan, MD on 08/19/2019 ° °  °Intrahepatic cholangiocarcinoma (HCC)  °06/28/2019 Imaging  ° CT AP W Contrast 06/28/19  °IMPRESSION: °1. Heterogeneous hypodensity posteriorly in the right hepatic lobe °and potentially extending into the caudate lobe suspicious for a °mass. There is felt to be truncation of branches of the portal vein °in this vicinity and some narrowing of the hepatic vein, as well as °triangular-shaped regions of abnormal hypoenhancement posteriorly in the right hepatic lobe likely representing downstream vascular effects. Cannot exclude malignancy such as cholangiocarcinoma or hepatocellular carcinoma, and follow up hepatic protocol MRI with and without contrast is recommended to further characterize. °2. 4 mm right middle lobe pulmonary nodule is likely benign but may merit surveillance. °3. Cholelithiasis. °4.  Aortic Atherosclerosis (ICD10-I70.0). °5. Prostatomegaly. °6. Mild impingement at L3-4 and L4-5. °  °07/31/2019 Imaging  ° MRI Liver  07/31/19 °IMPRESSION: °1. 7.3 cm in long axis mass in the right hepatic lobe spanning into °the caudate lobe, high suspicion for malignancy such as °hepatocellular carcinoma or cholangiocarcinoma. Suspected effacement °or occlusion of the right hepatic vein and posterior branches of the °right portal vein. Two smaller tumor nodules along the posterior °periphery of the dominant mass. Tissue diagnosis is recommended. °2. No findings of pathologic adenopathy or distant metastatic °spread. °3. 9 mm gallstone in the gallbladder. There is mild gallbladder wall °thickening which may be from nondistention, correlate clinically in °assessing for cholecystitis. °4.  Aortic Atherosclerosis (ICD10-I70.0). °5. Mild diffuse hepatic steatosis. °  °08/11/2019 Initial Biopsy  ° DIAGNOSIS: 08/11/19  °A. LIVER, RIGHT, BIOPSY:  °- Adenocarcinoma. °  °08/18/2019 Imaging  ° CT Chest 08/18/19  °IMPRESSION: °1. Multiple pulmonary nodules largest at approximately 7 mm in the °right lower lobe, nonspecific but concerning given findings in the °liver. °2. No signs of definitive metastatic disease, also with mildly °enlarged upper abdominal lymph nodes as discussed. °  °Aortic Atherosclerosis (ICD10-I70.0). °  °08/19/2019 Initial Diagnosis  ° Intrahepatic cholangiocarcinoma (HCC) °  °08/19/2019 Cancer Staging  ° Staging form: Intrahepatic Bile Duct, AJCC 8th Edition °- Clinical stage from 08/19/2019: Stage II (cT2, cN0, cM0) - Signed by Feng, Yan, MD on 08/19/2019 ° °  °09/29/2019 - 07/19/2020 Chemotherapy  ° Cisplatin and Gemcitabine 2 weeks on/1 week off starting 09/29/19. Cisplatin held from cycle 9 (03/23/20) due to fluid status/Afib. He is   is now on maintenance Gemcitabine. Held after 07/19/20 to proceed with liver target therapy.     10/09/2019 Imaging   CT AP IMPRESSION: 1. The dominant right hepatic lobe mass is minimally reduced in size compared to prior exams, currently measuring 6.2 by 5.3 cm, previously 6.2 by 5.6 cm. However, there is a new  small hypodense lesion centrally in the right hepatic lobe which is suspicious for a new small focus of tumor. Accordingly this is an overall mixed appearance. 2. Continued hypoenhancement in the liver downstream of the tumor likely attributable to narrowing or occlusion of the right hepatic vein by the tumor. By virtue of its location the tumor wraps around the intrahepatic portion of the IVC. 3. 4 mm right middle lobe pulmonary nodule, stable compared to earliest available comparison of 07/18/2019. Surveillance of the patient's pulmonary nodules is recommended. 4. Other imaging findings of potential clinical significance: Coronary atherosclerosis. Cholelithiasis. Prominent stool throughout the colon favors constipation. Moderate prostatomegaly with heterogeneous enhancement of the prostate gland. Lumbar spondylosis and degenerative disc disease causing mild bilateral foraminal impingement at L3-4 and L4-5.   Aortic Atherosclerosis (ICD10-I70.0).   12/24/2019 Imaging   CT CAP W Contrast  IMPRESSION: 1. Right hepatic lobe mass and adjacent right hepatic lobe nodules appear grossly stable. No evidence of distant metastatic disease. 2. Continued stability of small pulmonary nodules. Recommend attention on follow-up. 3. Cholelithiasis. 4. Enlarged prostate. 5. Aortic atherosclerosis (ICD10-I70.0). Coronary artery calcification.   02/23/2020 Procedure   He had PAC placed on 02/23/20.    04/08/2020 Imaging   CT CAP  IMPRESSION: 1. Stable or minimally decreased size of a very ill-defined, hypodense and somewhat retractile appearing mass of the central right lobe of the liver abutting the inferior vena cava and right portal vein, measuring approximately 5.7 x 5.0 cm, previously 6.2 x 5.2 cm when measured similarly. Findings are consistent with stable or minimally improved cholangiocarcinoma. 2. Small hypodense nodules of the right lobe identified on prior examination are poorly appreciated on  this single phase contrast examination although not grossly changed. Attention on follow-up. 3. There are new, moderate bilateral pleural effusions and associated atelectasis or consolidation as well as a new small pericardial effusion, nonspecific although generally concerning and suspicious for malignant effusions. There is no directly visualized pleural nodularity. 4. Multiple small pulmonary nodules are stable.  No new nodules. 5. Coronary artery disease. Aortic Atherosclerosis (ICD10-I70.0).   07/06/2020 Imaging   MRI ABD IMPRESSION: 1. Interval decrease in size of the right hepatic lobe lesion in the medial aspect of the segment 7. Findings would suggest a good response to treatment with some contraction of the tumor. 2. No new hepatic lesions. No abdominal adenopathy or metastatic disease. 3. Stable mild intrahepatic biliary dilatation in the right hepatic lobe distal to the lesion. 4. Somewhat tortuous and almost beaded appearance of the hepatic and portal vein radicles. Findings could be radiation related. 5. Cholelithiasis.  CT chest wo contrast IMPRESSION: 1. Persistent/stable moderate-sized bilateral pleural effusions with overlying atelectasis. 2. Stable small right pulmonary nodules. No new or progressive findings. Recommend continued surveillance. 3. No mediastinal or hilar mass or adenopathy. 4. Stable advanced three-vessel coronary artery calcifications. 5. Cholelithiasis. Aortic Atherosclerosis (ICD10-I70.0)   08/11/2020 Procedure   Y90 on 08/11/20 and 08/26/20 with Dr Laurence Ferrari    11/30/2020 Imaging   MRI Abdomen  IMPRESSION: 1. Today's study demonstrates progression of disease with enlarging central lesion in the right lobe of the liver involving portions of segments 7 and 8,  now with evidence of some tumor thrombus extension into the intrahepatic portion of the inferior vena cava. This is associated with increasing intrahepatic biliary ductal dilatation in  segment 7 of the liver. 2. In addition, there is some subtle hyperenhancement of the distal common bile duct at and immediately proximally to the level of the ampulla. There is also some subtle delayed enhancement around this region in the pancreatic head. This is of uncertain etiology and significance, but may be inflammatory and currently is not associated with proximal common bile duct dilatation. However, close attention on follow-up imaging is recommended. 3. Cholelithiasis without evidence of acute cholecystitis at this time. 4. Aortic atherosclerosis.     12/24/2020 - 03/21/2021 Chemotherapy   Restart Gemcitabine 2 weeks on/1 week off given disease progression beginning 12/24/20. Discontinued after 03/21/21 due to disease progression in liver.     03/16/2021 Imaging   MRI abdomen  IMPRESSION: No significant change in size of irregular hypovascular mass in the medial right hepatic lobe.   New 1.1 cm hypovascular lesion with peripheral rim enhancement in liver dome, suspicious for metastatic disease.   New small right pleural effusion and mild ascites.   Cholelithiasis, without evidence of cholecystitis or biliary dilatation.   04/05/2021 -  Chemotherapy   Second-line FOLFOX q2weeks starting 04/05/21    04/05/2021 -  Chemotherapy   Immunotherapy with Durvalumab (Imfinzi) q4weeks starting 04/05/21. (in addition to second line FOLFOX)   06/23/2021 Imaging   MRI Abdomen IMPRESSION: 1. No significant change in subcapsular mass of the posterior right lobe of the liver. 2. No significant change in an additional small hyperenhancing lesion of the liver dome, which remains suspicious for a satellite lesion. 3. No new liver lesions. 4. 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 5. Cholelithiasis without evidence of acute cholecystitis. 6. Trace ascites.  CT Chest w/o contrast IMPRESSION: 1. Occasional small pulmonary nodules in the right lung are stable, for example a  6 mm nodule right lower lobe and a 3 mm nodule of the lateral segment right middle lobe. These are nonspecific and likely benign, incidental sequelae of infection or inflammation, metastatic disease not favored. Attention on follow-up. 2. Previously seen bilateral pleural effusions are resolved. 3. Hypodense lesion of the posterior right lobe of the liver, keeping with known cholangiocarcinoma and better characterized by same day MR. 4. Coronary artery disease.   10/21/2021 Imaging   EXAM: MRI ABDOMEN WITHOUT AND WITH CONTRAST  IMPRESSION: 1. Mild progression of dominant right hepatic lobe mass with involvement of the right portal, hepatic veins and minimal extension into the IVC. 2. Hepatic dome satellite lesion, new or more conspicuous today. 3. Cholelithiasis 4. Decrease in trace perihepatic and perisplenic ascites. 5.  Aortic Atherosclerosis (ICD10-I70.0).   10/21/2021 Imaging   EXAM: CT CHEST WITHOUT CONTRAST  IMPRESSION: 1. Since 06/23/2021, enlargement of a right lower lobe and possible enlargement of a right middle lobe pulmonary nodule. Findings are suspicious for metastatic disease. 2. No thoracic adenopathy. 3. Coronary artery atherosclerosis. Aortic Atherosclerosis (ICD10-I70.0).   11/01/2021 - 01/11/2022 Chemotherapy   Patient is on Treatment Plan : PANCREAS FOLFIRI q14d     01/19/2022 Imaging   EXAM: CT CHEST WITHOUT CONTRAST  IMPRESSION: 1. Continued increase in size of lung nodule within the posterior right lung base which now measures 1 cm and is suspicious for metastatic disease. The remaining lung nodules are stable from 10/21/2021. 2. No thoracic adenopathy. 3. Aortic Atherosclerosis (ICD10-I70.0). Coronary artery calcifications.   01/19/2022 Imaging   EXAM:  ABDOMEN WITHOUT AND WITH CONTRAST ° °IMPRESSION: °1. No significant change in size or appearance of the large °heterogeneous enhancing mass area in the right hepatic lobe as °described. There is  mild interval enlargement of the adjacent °satellite lesion near the dome of the right lobe now measuring 1.9 °cm. °2. Redemonstration of a pulmonary nodule in the right lower lobe °measuring 9 mm. °3. Small volume ascites. °4. Splenomegaly. °5. Cholelithiasis. °  ° ° ° °INTERVAL HISTORY:  °Brandon Sherman is here for a follow up of cholangiocarcinoma. He was last seen by me on 01/09/22. He presents to the clinic accompanied by his wife. °He reports his mouth gets sore after his infusion. He denies outright sores but notes white bumps and rawness. °  °All other systems were reviewed with the patient and are negative. ° °MEDICAL HISTORY:  °Past Medical History:  °Diagnosis Date  ° Arthritis   ° Diabetes (HCC)   ° GERD (gastroesophageal reflux disease)   ° Hyperlipidemia   ° Hypertension   ° Intrahepatic cholangiocarcinoma (HCC)   ° ° °SURGICAL HISTORY: °Past Surgical History:  °Procedure Laterality Date  ° APPENDECTOMY  1980  ° CARDIOVERSION N/A 04/27/2020  ° Procedure: CARDIOVERSION;  Surgeon: Nelson, Katarina H, MD;  Location: [facility];  Service: Cardiovascular;  Laterality: N/A;  ° CARDIOVERSION N/A 05/19/2020  ° Procedure: CARDIOVERSION;  Surgeon: Acharya, Gayatri A, MD;  Location: [facility] ENDOSCOPY;  Service: Cardiovascular;  Laterality: N/A;  ° COLONOSCOPY    ° IR 3D INDEPENDENT WKST  08/11/2020  ° IR 3D INDEPENDENT WKST  08/11/2020  ° IR ANGIOGRAM SELECTIVE EACH ADDITIONAL VESSEL  08/11/2020  ° IR ANGIOGRAM SELECTIVE EACH ADDITIONAL VESSEL  08/11/2020  ° IR ANGIOGRAM SELECTIVE EACH ADDITIONAL VESSEL  08/11/2020  ° IR ANGIOGRAM SELECTIVE EACH ADDITIONAL VESSEL  08/11/2020  ° IR ANGIOGRAM SELECTIVE EACH ADDITIONAL VESSEL  08/11/2020  ° IR ANGIOGRAM SELECTIVE EACH ADDITIONAL VESSEL  08/11/2020  ° IR ANGIOGRAM SELECTIVE EACH ADDITIONAL VESSEL  08/26/2020  ° IR ANGIOGRAM SELECTIVE EACH ADDITIONAL VESSEL  08/26/2020  ° IR ANGIOGRAM VISCERAL SELECTIVE  08/11/2020  ° IR ANGIOGRAM VISCERAL SELECTIVE  08/26/2020  ° IR EMBO ARTERIAL  NOT HEMORR HEMANG INC GUIDE ROADMAPPING  08/11/2020  ° IR EMBO TUMOR ORGAN ISCHEMIA INFARCT INC GUIDE ROADMAPPING  08/26/2020  ° IR IMAGING GUIDED PORT INSERTION  02/23/2020  ° IR RADIOLOGIST EVAL & MGMT  07/14/2020  ° IR RADIOLOGIST EVAL & MGMT  09/30/2020  ° IR RADIOLOGIST EVAL & MGMT  12/01/2020  ° IR US GUIDE VASC ACCESS LEFT  08/26/2020  ° IR US GUIDE VASC ACCESS RIGHT  08/11/2020  ° ° °I have reviewed the social history and family history with the patient and they are unchanged from previous note. ° °ALLERGIES:  has No Known Allergies. ° °MEDICATIONS:  °Current Outpatient Medications  °Medication Sig Dispense Refill  ° Ivosidenib (TIBSOVO) 250 MG TABS Take 500 mg by mouth daily. 60 tablet 0  ° allopurinol (ZYLOPRIM) 100 MG tablet Take 100 mg by mouth daily.    ° colchicine 0.6 MG tablet Take 1 tablet (0.6 mg total) by mouth daily as needed (gout flare). Take daily for 3 days, then as needed for gout flare 30 tablet 0  ° diltiazem (CARDIZEM CD) 240 MG 24 hr capsule TAKE 1 CAPSULE BY MOUTH EVERY DAY, STOP AMLODIPINE 90 capsule 3  ° ELIQUIS 5 MG TABS tablet TAKE 1 TABLET TWICE DAILY 180 tablet 1  ° fenofibrate (TRICOR) 145 MG tablet Take 145 mg by   mouth daily.    ° finasteride (PROSCAR) 5 MG tablet Take 5 mg by mouth daily.    ° furosemide (LASIX) 40 MG tablet TAKE 2 TABLETS (80 MG TOTAL) BY MOUTH 2 (TWO) TIMES DAILY. 360 tablet 3  ° gabapentin (NEURONTIN) 100 MG capsule Take 100 mg by mouth at bedtime.    ° hydrALAZINE (APRESOLINE) 50 MG tablet Take 1 tablet (50 mg total) by mouth 2 (two) times daily. 180 tablet 2  ° KLOR-CON M20 20 MEQ tablet TAKE 1 TABLET BY MOUTH TWICE A DAY 180 tablet 3  ° lidocaine-prilocaine (EMLA) cream Apply 1 application topically as needed. 30 g 3  ° magnesium oxide (MAG-OX) 400 (240 Mg) MG tablet TAKE 1 TABLET BY MOUTH EVERY DAY 30 tablet 1  ° metFORMIN (GLUCOPHAGE-XR) 500 MG 24 hr tablet     ° metoprolol succinate (TOPROL XL) 25 MG 24 hr tablet Take 1 tablet (25 mg total) by mouth daily. 90  tablet 3  ° metoprolol tartrate (LOPRESSOR) 50 MG tablet     ° ondansetron (ZOFRAN) 8 MG tablet TAKE 1 TABLET BY MOUTH TWICE A DAY AS NEEDED START ON 3RD DAY AFTER CHEMOTHERAPY (Patient not taking: Reported on 11/16/2021) 30 tablet 1  ° pantoprazole (PROTONIX) 40 MG tablet TAKE 1 TABLET BY MOUTH TWICE A DAY 180 tablet 2  ° pravastatin (PRAVACHOL) 40 MG tablet Take 40 mg by mouth daily.    ° sucralfate (CARAFATE) 1 g tablet Take 1 tablet (1 g total) by mouth every 6 (six) hours as needed. Please schedule a yearly follow up for further refills: [phone] (Patient not taking: Reported on 10/10/2021) 60 tablet 0  ° tamsulosin (FLOMAX) 0.4 MG CAPS capsule Take 0.4 mg by mouth daily.    ° °Current Facility-Administered Medications  °Medication Dose Route Frequency Provider Last Rate Last Admin  ° sodium chloride flush (NS) 0.9 % injection 10 mL  10 mL Intravenous PRN Feng, Yan, MD   10 mL at 01/24/22 1024  ° ° °PHYSICAL EXAMINATION: °ECOG PERFORMANCE STATUS: 1 - Symptomatic but completely ambulatory ° °Vitals:  ° 01/24/22 0953  °BP: 135/67  °Pulse: 63  °Resp: 18  °Temp: 98.1 °F (36.7 °C)  °SpO2: 100%  ° °Wt Readings from Last 3 Encounters:  °01/24/22 217 lb 12.8 oz (98.8 kg)  °01/09/22 221 lb 4.8 oz (100.4 kg)  °12/26/21 222 lb 3.2 oz (100.8 kg)  °  ° °GENERAL:alert, no distress and comfortable °SKIN: skin color normal, no rashes or significant lesions °EYES: normal, Conjunctiva are pink and non-injected, sclera clear  °OROPHARYNX:no exudate, lips, buccal mucosa, and tongue normal, (+) mild erythema °NEURO: alert & oriented x 3 with fluent speech ° °LABORATORY DATA:  °I have reviewed the data as listed °CBC Latest Ref Rng & Units 01/24/2022 01/09/2022 12/26/2021  °WBC 4.0 - 10.5 K/uL 5.6 6.4 6.9  °Hemoglobin 13.0 - 17.0 g/dL 9.5(L) 9.8(L) 10.0(L)  °Hematocrit 39.0 - 52.0 % 30.3(L) 30.8(L) 31.6(L)  °Platelets 150 - 400 K/uL 102(L) 101(L) 109(L)  ° ° ° °CMP Latest Ref Rng & Units 01/24/2022 01/09/2022 12/26/2021  °Glucose 70 - 99  mg/dL 221(H) 195(H) 172(H)  °BUN 8 - 23 mg/dL 16 14 18  °Creatinine 0.61 - 1.24 mg/dL 1.45(H) 1.49(H) 1.45(H)  °Sodium 135 - 145 mmol/L 137 138 137  °Potassium 3.5 - 5.1 mmol/L 3.7 4.1 4.2  °Chloride 98 - 111 mmol/L 105 108 107  °CO2 22 - 32 mmol/L 25 24 24  °Calcium 8.9 - 10.3 mg/dL 9.2 9.2 9.4  °  Total Protein 6.5 - 8.1 g/dL 6.5 6.5 6.6  °Total Bilirubin 0.3 - 1.2 mg/dL 0.5 0.5 0.4  °Alkaline Phos 38 - 126 U/L 98 109 107  °AST 15 - 41 U/L 33 24 28  °ALT 0 - 44 U/L 31 25 31  ° ° ° ° °RADIOGRAPHIC STUDIES: °I have personally reviewed the radiological images as listed and agreed with the findings in the report. °No results found.  ° ° °No orders of the defined types were placed in this encounter. ° °All questions were answered. The patient knows to call the clinic with any problems, questions or concerns. No barriers to learning was detected. °The total time spent in the appointment was 40 minutes. ° °  ° Yan Feng, MD °01/24/2022  ° °I, Katie Daubenspeck, am acting as scribe for Yan Feng, MD.  ° °I have reviewed the above documentation for accuracy and completeness, and I agree with the above. °  ° ° °

## 2022-01-24 NOTE — Telephone Encounter (Signed)
Oral Oncology Patient Advocate Encounter ?  ?Received notification from [facility] that prior authorization for Tibsovo is required. ?  ?PA submitted on CoverMyMeds ?Key [id number] ?Status is pending ?  ?Oral Oncology Clinic will continue to follow. ? ?Wynn Maudlin CPHT ?Specialty Pharmacy Patient Advocate ?[place] ?Phone [phone] ?Fax [phone] ?01/24/2022 10:30 AM ? ? ?

## 2022-01-24 NOTE — Patient Instructions (Signed)

## 2022-01-24 NOTE — Patient Instructions (Signed)

## 2022-01-24 NOTE — Telephone Encounter (Signed)
Oral Oncology Pharmacist Encounter ? ?Received new prescription for Tibsovo (ivosidenib) for the treatment of progressive intrahepatic cholangiocarcinoma, planned duration until disease progression or unacceptable drug toxicity. ? ?CBC w/ Diff, CMP and Mg from 01/24/22 assessed, noted pt Scr of 1.45 mg/dL (CrCl ~63.4 mL/min) - no baseline dose adjustments required per package insert. LFTs stable. Mg WNL at 1.9 mg/dL (recommend continued monitoring as Tibsovo can cause decrease in Mg). Baseline EKG obtained on 01/24/22.  ? ?Current medication list in Epic reviewed, DDIs with Tibsovo identified: ?Category D drug-drug interaction between Tibsovo and Diltiazem - diltiazem a moderate CYP3A4 inhibitor may increase serum concentrations of Tibsovo. MD to reach out to patient's cardiologist for possible alternatives - if no alternatives available patient will need to be closely monitored for Qtc prolongation and electrolyte abnormalities.  ?Category D drug-drug interaction between Tibsovo and Ondansetron - both agents have risk of Qtc prolongation. Recommend discontinuing ondansetron for N/V while on Tibsovo and use of alternative antiemetic while on therapy if needed.  ? ?Evaluated chart and no patient barriers to medication adherence noted.  ? ?Patient agreement for treatment documented in MD note on 01/24/22. ? ?Prescription has been e-scribed to the [facility] for benefits analysis and approval. ? ?Oral Oncology Clinic will continue to follow for insurance authorization, copayment issues, initial counseling and start date. ? ?Leron Croak, PharmD, BCPS ?Hematology/Oncology Clinical Pharmacist ?[facility] Oral Chemotherapy Navigation Clinics ?[phone] ?01/24/2022 11:50 AM ? ?

## 2022-01-24 NOTE — Telephone Encounter (Signed)
Oral Oncology Patient Advocate Encounter ? ?Prior Authorization for Tibsovo has been approved.   ? ?PA# [id number] ?Effective dates: 01/24/22 through 07/27/22 ? ?Patients co-pay is $4235 ? ?Oral Oncology Clinic will continue to follow.  ? ?Wynn Maudlin CPHT ?Specialty Pharmacy Patient Advocate ?[place] ?Phone [phone] ?Fax [phone] ?01/24/2022 12:03 PM ? ?

## 2022-01-24 NOTE — Telephone Encounter (Signed)
Oral Oncology Patient Advocate Encounter ?  ?Was successful in securing patient an $30 grant from Patient [employer] [facility]) to provide copayment coverage for Tibsovo.  This will keep the out of pocket expense at $0.   ?  ?I have spoken with the patient.   ? ?The billing information is as follows and has been shared with [place].  ? ?Member ID: [id number]  ?Group ID: [id number] ?RxBin: [id number] ?Dates of Eligibility: 10/26/21 through 01/24/23 ? ?Fund:  [place] ? ? ?Wynn Maudlin CPHT ?Specialty Pharmacy Patient Advocate ?[place] ?Phone [phone] ?Fax [phone] ?01/24/2022 12:50 PM ? ?

## 2022-01-25 ENCOUNTER — Telehealth: Payer: Self-pay | Admitting: Hematology

## 2022-01-25 NOTE — Telephone Encounter (Signed)
Cancelled upcoming appointments and scheduled follow-up appointment per 3/7 los. Patient is aware. ?

## 2022-01-26 ENCOUNTER — Inpatient Hospital Stay: Payer: Medicare HMO

## 2022-01-30 ENCOUNTER — Other Ambulatory Visit (HOSPITAL_COMMUNITY): Payer: Self-pay

## 2022-01-30 MED ORDER — METOPROLOL SUCCINATE ER 50 MG PO TB24: 50.0000 mg | ORAL_TABLET | Freq: Every day | ORAL | 3 refills | Status: DC

## 2022-01-30 NOTE — Telephone Encounter (Signed)
Spoke with wife.  She was not sure what was happening - unsure if they will be able to afford new chemo med.   Explained that we need to have him off diltiazem for them to start Tibsovo.  Will increase metoprolol succinate to 50 mg daily and d/c diltiazem for now.  Wife aware that if AF symptoms return they should reach out for further titration of metoprolol.  She will put diltiazem away for now, if they cannot get the Tibsovo, we can consider going back on this.   ?

## 2022-01-30 NOTE — Telephone Encounter (Signed)
LMOM - will need to d/c diltiazem and increase metoprolol succ to 50 mg daily.   ?

## 2022-01-30 NOTE — Telephone Encounter (Signed)
-----   Message from Britt Boozer, [facility] sent at 01/27/2022 11:42 AM EST ----- ?Regarding: Medication change follow-up ?Good Morning Erasmo Downer, ? ?Sarajane Marek, one of the oncology pharmacists that works with Dr. Burr Medico. Do you mind letting me know when Mr. Frenkel has had his diltiazem and metoprolol addressed so that we can arrange for the start date and f/u labs at the cancer center for his Tibsovo? ? ?Thanks so much, ?[name] ? ?Leron Croak, PharmD, BCPS ?Hematology/Oncology Clinical Pharmacist ?[facility] Oral Chemotherapy Navigation Clinics ?[phone] ?01/27/2022 11:44 AM ? ? ?----- Message ----- ?From: [name], MD ?Sent: 01/24/2022   9:13 PM EST ?To: Lorretta Harp, MD, Britt Boozer, [place] ? ?Thanks much, appreciate it! ? ?Krista Blue  ?----- Message ----- ?From: Lorretta Harp, MD ?Sent: 01/24/2022   4:02 PM EST ?To: Rockne Menghini, RPH-CPP, [name], MD ? ?He is already on metoprolol but we can certainly increase that from once a day to twice a day and discontinue the diltiazem.  I will have my Pharm.D. review and make changes. ?----- Message ----- ?From: [name], MD ?Sent: 01/24/2022   3:25 PM EST ?To: Lorretta Harp, MD, Britt Boozer, [place] ? ?Dr. Gwenlyn Found, ? ?Due to his cancer progression, I plan to change his treatment to Ivosidenib, an oral targeted therapy. It has DDI with his Cardizem with prolonged QT. I was able to switch his Cardizem to something else, such as metoprolol? ? ?Thanks  ? ?[name]  ? ? ? ? ?

## 2022-01-31 ENCOUNTER — Telehealth: Payer: Self-pay | Admitting: Pharmacist

## 2022-01-31 ENCOUNTER — Other Ambulatory Visit (HOSPITAL_COMMUNITY): Payer: Self-pay

## 2022-01-31 NOTE — Telephone Encounter (Signed)
Oral Chemotherapy Pharmacist Encounter ? ?I spoke with patient for overview of new oral chemotherapy medication: Tibsovo (ivosidenib) for the treatment of IDH1 mutated intrahepatic cholangiocarcinoma, planned duration until disease progression or unacceptable toxicity.  ? ?Counseled patient on administration, dosing, side effects, monitoring, drug-food interactions, safe handling, storage, and disposal. ? ?Patient will take Tibsovo 250 mg tablets, 2 tablets (500 mg) by mouth once daily, with or without food, at approximately the same time each day. ?Patient informed that Tibsovo should not be taken with a high fat meal due to risk of very increased absorption. ? ?Patient knows to avoid grapefruit and grapefruit juice while on therapy with Tibsovo. ? ?Tibsovo start date: 02/02/22 ?   ?Side effects include but not limited to: GI upset (nausea, diarrhea, abdominal pain, decreased appetite, vomiting), fatigue, cough, lower extremity edema.  ? ?Patient has anti-emetic on hand and knows to take it if nausea develops.   ?Patient will obtain anti diarrheal and alert the office of 4 or more loose stools above baseline. ? ?After starting the Tibsovo patient will need follow-up EKGs weekly x 3 and then every month thereafter to monitor Qtc. Will notify MD.   ? ?Reviewed with patient importance of keeping a medication schedule and plan for any missed doses. No barriers to medication adherence identified. ? ?Medication reconciliation performed and medication/allergy list updated. Diltiazem has been discontinued as of 01/30/22.  ? ?Insurance authorization for Tibsovo has been obtained. ?Test claim at the pharmacy revealed copayment [phone] for 1st fill of Tibsovo. [facility] obtained to bring patient's out of pocket cost to $0. Manufacturer assistance will need to be obtained for subsequent fills. ?This will ship from the [facility] on 01/31/22 to deliver to patient's home on 02/01/22. ? ?All questions answered. ? ?Mr.  Pebley voiced understanding and appreciation.  ? ?Medication education handout placed in mail for patient. Patient knows to call the office with questions or concerns. Oral Chemotherapy Clinic phone number provided to patient.  ? ?Leron Croak, PharmD, BCPS ?Hematology/Oncology Clinical Pharmacist ?[facility] Oral Chemotherapy Navigation Clinics ?[phone] ?01/31/2022 1:44 PM ? ?

## 2022-02-01 ENCOUNTER — Other Ambulatory Visit: Payer: Self-pay | Admitting: *Deleted

## 2022-02-01 ENCOUNTER — Telehealth: Payer: Self-pay | Admitting: *Deleted

## 2022-02-01 DIAGNOSIS — I482 Chronic atrial fibrillation, unspecified: Secondary | ICD-10-CM

## 2022-02-01 NOTE — Telephone Encounter (Signed)
Called pt with message below. Let pt know that EKG's have been scheduled on pt personal VM. Advised to call office if there are any concerns ?

## 2022-02-01 NOTE — Telephone Encounter (Signed)
-----   Message from Britt Boozer, [facility] sent at 02/01/2022  1:48 PM EDT ----- ?Regarding: EKG follow-up for Tibsovo ?Hi Dr. Burr Medico, ? ?Brandon Sherman will be starting his Tibsovo tomorrow (02/02/22). The package insert recommends weekly EKGs x 3 after starting the Tibsovo and then monthly thereafter. Given his cardiac history I would recommend having these scheduled to make sure his Qtc remains WNL.  ? ?Thanks, ?[name] ? ?

## 2022-02-02 ENCOUNTER — Other Ambulatory Visit: Payer: Self-pay | Admitting: Physician Assistant

## 2022-02-02 NOTE — Telephone Encounter (Signed)
Prescription refill request for Eliquis received. ?Indication:Afib ?Last office visit:7/22 ?Scr:1.4 ?Age: 76 ?Weight:98.8 kg ? ?Prescription refilled ? ?

## 2022-02-03 ENCOUNTER — Telehealth: Payer: Self-pay

## 2022-02-03 ENCOUNTER — Other Ambulatory Visit (HOSPITAL_COMMUNITY): Payer: Self-pay

## 2022-02-03 ENCOUNTER — Encounter: Payer: Self-pay | Admitting: Hematology

## 2022-02-03 NOTE — Telephone Encounter (Signed)
Oral Oncology Patient Advocate Encounter ? ?Met patient in lobby room to complete application for [employer] in an effort to reduce patient's out of pocket expense for Tibsovo to $0.   ? ?Application completed and faxed to [phone].  ? ?[employer] patient assistance phone number for follow up is [phone].  ? ?This encounter will be updated until final determination.  ? ?Wynn Maudlin CPHT ?Specialty Pharmacy Patient Advocate ?[place] ?Phone [phone] ?Fax [phone] ?02/03/2022 10:50 AM ? ? ? ?

## 2022-02-03 NOTE — Telephone Encounter (Signed)
Oral Oncology Patient Advocate Encounter ?  ?Was successful in securing patient a $8000 grant from [place] to provide copayment coverage for Tibsovo.  This will keep the out of pocket expense at $0.   ?   ? ?The billing information is as follows and has been shared with [place].  ? ?Member ID: [id number] ?Group ID: [id number] ?RxBin: [id number] ?PCN: [id number] ?Dates of Eligibility: 02/03/22 through 02/04/23 ? ?Fund name:  Cholangiocarcinoma. ? ?Wynn Maudlin CPHT ?Specialty Pharmacy Patient Advocate ?[place] ?Phone [phone] ?Fax [phone] ?02/03/2022 3:07 PM ? ?

## 2022-02-06 ENCOUNTER — Other Ambulatory Visit: Payer: Self-pay

## 2022-02-06 ENCOUNTER — Inpatient Hospital Stay: Payer: Medicare HMO

## 2022-02-06 ENCOUNTER — Inpatient Hospital Stay: Payer: Medicare HMO | Admitting: Nurse Practitioner

## 2022-02-06 DIAGNOSIS — I4891 Unspecified atrial fibrillation: Secondary | ICD-10-CM | POA: Diagnosis not present

## 2022-02-06 DIAGNOSIS — M109 Gout, unspecified: Secondary | ICD-10-CM | POA: Diagnosis not present

## 2022-02-06 DIAGNOSIS — C221 Intrahepatic bile duct carcinoma: Secondary | ICD-10-CM | POA: Diagnosis not present

## 2022-02-06 DIAGNOSIS — Z79899 Other long term (current) drug therapy: Secondary | ICD-10-CM | POA: Diagnosis not present

## 2022-02-06 DIAGNOSIS — Z7901 Long term (current) use of anticoagulants: Secondary | ICD-10-CM | POA: Diagnosis not present

## 2022-02-06 DIAGNOSIS — E1140 Type 2 diabetes mellitus with diabetic neuropathy, unspecified: Secondary | ICD-10-CM | POA: Diagnosis not present

## 2022-02-06 DIAGNOSIS — I509 Heart failure, unspecified: Secondary | ICD-10-CM | POA: Diagnosis not present

## 2022-02-06 DIAGNOSIS — F17220 Nicotine dependence, chewing tobacco, uncomplicated: Secondary | ICD-10-CM | POA: Diagnosis not present

## 2022-02-06 DIAGNOSIS — I110 Hypertensive heart disease with heart failure: Secondary | ICD-10-CM | POA: Diagnosis not present

## 2022-02-07 NOTE — Telephone Encounter (Signed)
Patient is approved for Tibsovo at no cost from [employer] 02/07/22-11/19/22 ? ?Wynn Maudlin CPHT ?Specialty Pharmacy Patient Advocate ?[place] ?Phone [phone] ?Fax [phone] ?02/07/2022 8:50 AM ? ?

## 2022-02-08 ENCOUNTER — Inpatient Hospital Stay: Payer: Medicare HMO

## 2022-02-13 ENCOUNTER — Encounter: Payer: Self-pay | Admitting: Hematology

## 2022-02-13 ENCOUNTER — Inpatient Hospital Stay: Payer: Medicare HMO

## 2022-02-13 ENCOUNTER — Inpatient Hospital Stay: Payer: Medicare HMO | Admitting: Hematology

## 2022-02-13 ENCOUNTER — Other Ambulatory Visit: Payer: Self-pay | Admitting: Pharmacist

## 2022-02-13 ENCOUNTER — Other Ambulatory Visit: Payer: Self-pay

## 2022-02-13 ENCOUNTER — Other Ambulatory Visit (HOSPITAL_COMMUNITY): Payer: Self-pay

## 2022-02-13 VITALS — BP 149/70 | HR 63 | Temp 97.2°F | Resp 18 | Wt 218.2 lb

## 2022-02-13 DIAGNOSIS — I509 Heart failure, unspecified: Secondary | ICD-10-CM | POA: Diagnosis not present

## 2022-02-13 DIAGNOSIS — M109 Gout, unspecified: Secondary | ICD-10-CM | POA: Diagnosis not present

## 2022-02-13 DIAGNOSIS — C221 Intrahepatic bile duct carcinoma: Secondary | ICD-10-CM | POA: Diagnosis not present

## 2022-02-13 DIAGNOSIS — Z95828 Presence of other vascular implants and grafts: Secondary | ICD-10-CM

## 2022-02-13 DIAGNOSIS — E1140 Type 2 diabetes mellitus with diabetic neuropathy, unspecified: Secondary | ICD-10-CM | POA: Diagnosis not present

## 2022-02-13 DIAGNOSIS — I4891 Unspecified atrial fibrillation: Secondary | ICD-10-CM | POA: Diagnosis not present

## 2022-02-13 DIAGNOSIS — F17220 Nicotine dependence, chewing tobacco, uncomplicated: Secondary | ICD-10-CM | POA: Diagnosis not present

## 2022-02-13 DIAGNOSIS — Z7901 Long term (current) use of anticoagulants: Secondary | ICD-10-CM | POA: Diagnosis not present

## 2022-02-13 DIAGNOSIS — I110 Hypertensive heart disease with heart failure: Secondary | ICD-10-CM | POA: Diagnosis not present

## 2022-02-13 DIAGNOSIS — Z79899 Other long term (current) drug therapy: Secondary | ICD-10-CM | POA: Diagnosis not present

## 2022-02-13 LAB — CBC WITH DIFFERENTIAL (CANCER CENTER ONLY)
Abs Immature Granulocytes: 0.01 10*3/uL (ref 0.00–0.07)
Basophils Absolute: 0 10*3/uL (ref 0.0–0.1)
Basophils Relative: 1 %
Eosinophils Absolute: 0.1 10*3/uL (ref 0.0–0.5)
Eosinophils Relative: 3 %
HCT: 32 % — ABNORMAL LOW (ref 39.0–52.0)
Hemoglobin: 10.3 g/dL — ABNORMAL LOW (ref 13.0–17.0)
Immature Granulocytes: 0 %
Lymphocytes Relative: 13 %
Lymphs Abs: 0.6 10*3/uL — ABNORMAL LOW (ref 0.7–4.0)
MCH: 31.7 pg (ref 26.0–34.0)
MCHC: 32.2 g/dL (ref 30.0–36.0)
MCV: 98.5 fL (ref 80.0–100.0)
Monocytes Absolute: 0.6 10*3/uL (ref 0.1–1.0)
Monocytes Relative: 13 %
Neutro Abs: 3.1 10*3/uL (ref 1.7–7.7)
Neutrophils Relative %: 70 %
Platelet Count: 96 10*3/uL — ABNORMAL LOW (ref 150–400)
RBC: 3.25 MIL/uL — ABNORMAL LOW (ref 4.22–5.81)
RDW: 15.7 % — ABNORMAL HIGH (ref 11.5–15.5)
WBC Count: 4.4 10*3/uL (ref 4.0–10.5)
nRBC: 0 % (ref 0.0–0.2)

## 2022-02-13 LAB — CMP (CANCER CENTER ONLY)
ALT: 21 U/L (ref 0–44)
AST: 25 U/L (ref 15–41)
Albumin: 3.8 g/dL (ref 3.5–5.0)
Alkaline Phosphatase: 68 U/L (ref 38–126)
Anion gap: 5 (ref 5–15)
BUN: 19 mg/dL (ref 8–23)
CO2: 24 mmol/L (ref 22–32)
Calcium: 9.2 mg/dL (ref 8.9–10.3)
Chloride: 110 mmol/L (ref 98–111)
Creatinine: 1.3 mg/dL — ABNORMAL HIGH (ref 0.61–1.24)
GFR, Estimated: 58 mL/min — ABNORMAL LOW (ref >60)
Glucose, Bld: 128 mg/dL — ABNORMAL HIGH (ref 70–99)
Potassium: 4.2 mmol/L (ref 3.5–5.1)
Sodium: 139 mmol/L (ref 135–145)
Total Bilirubin: 0.4 mg/dL (ref 0.3–1.2)
Total Protein: 6.6 g/dL (ref 6.5–8.1)

## 2022-02-13 MED ORDER — TIBSOVO 250 MG PO TABS
500.0000 mg | ORAL_TABLET | Freq: Every day | ORAL | 1 refills | Status: DC
  Filled 2022-02-13: qty 60, 30d supply, fill #0

## 2022-02-13 MED ORDER — HEPARIN SOD (PORK) LOCK FLUSH 100 UNIT/ML IV SOLN
500.0000 [IU] | Freq: Once | INTRAVENOUS | Status: AC
  Administered 2022-02-13: 500 [IU]

## 2022-02-13 MED ORDER — SODIUM CHLORIDE 0.9% FLUSH
10.0000 mL | Freq: Once | INTRAVENOUS | Status: AC
  Administered 2022-02-13: 10 mL

## 2022-02-13 MED ORDER — TIBSOVO 250 MG PO TABS: 500.0000 mg | ORAL_TABLET | Freq: Every day | ORAL | 1 refills | Status: DC

## 2022-02-13 NOTE — Progress Notes (Signed)
?[facility]   ?Telephone:(336) [phone] [phone]   ?Clinic Follow up Note  ? ?Patient Care Team: ?[name], PA as PCP - General (Family Medicine) ?Lorretta Harp, MD as PCP - Cardiology (Cardiology) ?[name], MD as Consulting Physician (General Surgery) ?[name], [name], MD as Consulting Physician (Gastroenterology) ?[name], MD as Consulting Physician (Hematology) ?Lorretta Harp, MD as Consulting Physician (Cardiology) ? ?Date of Service:  02/13/2022 ? ?CHIEF COMPLAINT: f/u of cholangiocarcinoma ? ?CURRENT THERAPY:  ?Ivosidenib, started 02/02/22 ? ?ASSESSMENT & PLAN:  ?Brandon Sherman is a 76 y.o. male with  ? ?1. Intrahepatic cholangiocarcinoma, cT2N0Mx, unresectable, with indeterminate lung nodules, IDH mutation (+) ?-Diagnosed in 07/2019. CT scans and MRI liver show a large 7.3cm mass in the right hepatic lobe which abuts portal vein. Both our local surgeon Dr. Barry Dienes and Dr Carlis Abbott at [facility] concluded that cancer is not resectable, and therefore not likely curable, due to the invasion to portal vein. ?-He received palliative systemic treatment, standard first line chemo with IV Cisplatin and Gemcitabine 2 weeks on/1 week off, 09/29/19-07/19/20. Cisplatin discontinued 03/23/20 due to Afib. ?-His FO results showed MSI stable disease, IDH1 mutations he may benefit from IDH inhibitor  ?-he progressed through single agent gemcitabine (12/24/20-03/16/21), FOLFOX plus durvalumab (04/05/21-10/21/21), and recently FOLFIRI (11/01/21-01/19/22). ?-he was switched to ivosidenib on 02/02/22. He is tolerating well with no noticeable side effects. EKG has been unremarkable, I personally reviewed  ?  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 ?-mild and stable ?  ?3. Goal of care discussion  ?-We again discussed the incurable nature of his cancer, and the overall poor prognosis, especially if he does not have  good response to chemotherapy or progress on chemo ?-The patient understands the goal of care is palliative. ?-I recommend DNR/DNI. They brought copies of his living will. ?  ?4. Afib, CHF ?-diagnosed after PAC placement on 02/23/20. ?-CHADS2 score 2, moderate risk for stroke. Started on metoprolol and eliquis on 03/01/20. Tolerating well without bleeding.  ?-He had cardioversion on 05/19/20 ?-he is now off diltiazem and has increased his metoprolol, per cardiologist Dr. Gwenlyn Found. ?-We obtained repeat EKG today. ?  ?5. DM, HTN, HLD, Gout ?-On Metformin, amlodipine, lisinopril, lasix, allopurinol ?-Continue to f/u with his PCP  ?-I previously reduced lasix to 1 tab BID rather than 2 tabs BID ?-monitoring BP and BG ?  ?6. Nicotine Use ?-He never smoked but has been chewing Tobacco for the past 60 years. He no longer drinks alcohol and tells me he has quit tobacco use (11/25/19).  ?  ?7. GERD and gastritis, history of esophageal candidiasis ?-He had repeated EGD with Dr. Havery Moros in 07/2019. 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 May repeat EGD in future  ?-GERD resolved on PPI and carafate ?  ?  ?Plan: ?-continue ivosidenib ?-weekly EKG for 2 more in next two weeks  ?-lab, and f/u in 4 weeks ? ? ?No problem-specific Assessment & Plan notes found for this encounter. ? ? ?SUMMARY OF ONCOLOGIC HISTORY: ?Oncology History Overview Note  ?Cancer Staging ?Intrahepatic cholangiocarcinoma ([place]) ?Staging form: Intrahepatic Bile Duct, AJCC 8th Edition ?- Clinical stage from 08/19/2019: Stage II (cT2, cN0, cM0) - Signed by [name], MD on 08/19/2019 ? ?  ?Intrahepatic cholangiocarcinoma ([place])  ?06/28/2019 Imaging  ? CT AP W Contrast 06/28/19  ?IMPRESSION: ?  1. Heterogeneous hypodensity posteriorly in the right hepatic lobe ?and potentially extending into the caudate lobe suspicious for a ?mass. There is felt to be truncation of branches of the  portal vein ?in this vicinity and some narrowing of the hepatic vein, as well as ?triangular-shaped regions of abnormal hypoenhancement posteriorly in the right hepatic lobe likely representing downstream vascular effects. Cannot exclude malignancy such as cholangiocarcinoma or hepatocellular carcinoma, and follow up hepatic protocol MRI with and without contrast is recommended to further characterize. ?2. 4 mm right middle lobe pulmonary nodule is likely benign but may merit surveillance. ?3. Cholelithiasis. ?4.  Aortic Atherosclerosis (ICD10-I70.0). ?5. Prostatomegaly. ?6. Mild impingement at L3-4 and L4-5. ?  ?07/31/2019 Imaging  ? MRI Liver 07/31/19 ?IMPRESSION: ?1. 7.3 cm in long axis mass in the right hepatic lobe spanning into ?the caudate lobe, high suspicion for malignancy such as ?hepatocellular carcinoma or cholangiocarcinoma. Suspected effacement ?or occlusion of the right hepatic vein and posterior branches of the ?right portal vein. Two smaller tumor nodules along the posterior ?periphery of the dominant mass. Tissue diagnosis is recommended. ?2. No findings of pathologic adenopathy or distant metastatic ?spread. ?3. 9 mm gallstone in the gallbladder. There is mild gallbladder wall ?thickening which may be from nondistention, correlate clinically in ?assessing for cholecystitis. ?4.  Aortic Atherosclerosis (ICD10-I70.0). ?5. Mild diffuse hepatic steatosis. ?  ?08/11/2019 Initial Biopsy  ? DIAGNOSIS: 08/11/19  ?A. LIVER, RIGHT, BIOPSY:  ?- Adenocarcinoma. ?  ?08/18/2019 Imaging  ? CT Chest 08/18/19  ?IMPRESSION: ?1. Multiple pulmonary nodules largest at approximately 7 mm in the ?right lower lobe, nonspecific but concerning given findings in the ?liver. ?2. No signs of definitive metastatic disease, also with mildly ?enlarged upper abdominal lymph nodes as discussed. ?  ?Aortic Atherosclerosis (ICD10-I70.0). ?  ?08/19/2019 Initial Diagnosis  ? Intrahepatic cholangiocarcinoma ([place]) ?  ?08/19/2019 Cancer Staging   ? Staging form: Intrahepatic Bile Duct, AJCC 8th Edition ?- Clinical stage from 08/19/2019: Stage II (cT2, cN0, cM0) - Signed by [name], MD on 08/19/2019 ? ?  ?09/29/2019 - 07/19/2020 Chemotherapy  ? Cisplatin and Gemcitabine 2 weeks on/1 week off starting 09/29/19. Cisplatin held from cycle 9 (03/23/20) due to fluid status/Afib. He is now on maintenance Gemcitabine. Held after 07/19/20 to proceed with liver target therapy.  ? ?  ?10/09/2019 Imaging  ? CT AP IMPRESSION: ?1. The dominant right hepatic lobe mass is minimally reduced in size compared to prior exams, currently measuring 6.2 by 5.3 cm, previously 6.2 by 5.6 cm. However, there is a new small hypodense lesion centrally in the right hepatic lobe which is suspicious for a new small focus of tumor. Accordingly this is an overall mixed appearance. ?2. Continued hypoenhancement in the liver downstream of the tumor likely attributable to narrowing or occlusion of the right hepatic vein by the tumor. By virtue of its location the tumor wraps around ?the intrahepatic portion of the IVC. ?3. 4 mm right middle lobe pulmonary nodule, stable compared to earliest available comparison of 07/18/2019. Surveillance of the patient's pulmonary nodules is recommended. ?4. Other imaging findings of potential clinical significance: Coronary atherosclerosis. Cholelithiasis. Prominent stool throughout the colon favors constipation. Moderate prostatomegaly with ?heterogeneous enhancement of the prostate gland. Lumbar spondylosis and degenerative disc disease causing mild bilateral foraminal impingement at L3-4 and L4-5. ?  ?Aortic Atherosclerosis (ICD10-I70.0). ?  ?12/24/2019 Imaging  ? CT CAP W Contrast  ?IMPRESSION: ?1. Right hepatic lobe mass and adjacent right hepatic lobe nodules ?appear grossly stable. No evidence of distant  metastatic disease. ?2. Continued stability of small pulmonary nodules. Recommend ?attention on follow-up. ?3. Cholelithiasis. ?4. Enlarged prostate. ?5.  Aortic atherosclerosis (ICD10-I70.0). Coronary artery ?calcification. ?  ?02/23/2020 Procedure  ? He had PAC placed on 02/23/20.  ?  ?04/08/2020 Imaging  ? CT CAP  ?IMPRESSION: ?1. Stable or minimally decreased size

## 2022-02-13 NOTE — Progress Notes (Signed)
Oral Oncology Pharmacist Encounter ? ?Prescription refill for Tibsovo (ivosidenib) sent to [facility] in error. Patient enrolled in manufacturer assistance and receives medication through [employer].  ? ?Prescription redirected to [facility] for dispensing. ? ?Leron Croak, PharmD, BCPS ?Hematology/Oncology Clinical Pharmacist ?[facility] ?[phone] ?02/13/2022 12:51 PM ? ? ? ?

## 2022-02-17 ENCOUNTER — Telehealth: Payer: Self-pay

## 2022-02-17 ENCOUNTER — Other Ambulatory Visit: Payer: Self-pay

## 2022-02-17 NOTE — Telephone Encounter (Signed)
Pt's pharmacy for Tibsovo called stating they wanted to make Dr. Burr Medico aware of the drug interaction between Tibsovo and Ondansetron.  Pharmacist stated the drug interaction between these 2 drugs will cause prolonged QT.  Informed pharmacist that pt gets weekly EKGs d/t Tibsovo cardiac monitoring.  Also informed pharmacist, that this RN will notify Dr. Burr Medico of the drug interaction and will see if she would like to change his Ondansetron to another antiemetic. ?

## 2022-02-20 ENCOUNTER — Other Ambulatory Visit: Payer: Self-pay

## 2022-02-20 ENCOUNTER — Inpatient Hospital Stay: Payer: Medicare HMO | Attending: Hematology

## 2022-02-20 DIAGNOSIS — Z79899 Other long term (current) drug therapy: Secondary | ICD-10-CM | POA: Insufficient documentation

## 2022-02-20 DIAGNOSIS — Z87891 Personal history of nicotine dependence: Secondary | ICD-10-CM | POA: Diagnosis not present

## 2022-02-20 DIAGNOSIS — C221 Intrahepatic bile duct carcinoma: Secondary | ICD-10-CM

## 2022-02-20 DIAGNOSIS — I4891 Unspecified atrial fibrillation: Secondary | ICD-10-CM | POA: Insufficient documentation

## 2022-02-20 DIAGNOSIS — Z9221 Personal history of antineoplastic chemotherapy: Secondary | ICD-10-CM | POA: Diagnosis not present

## 2022-02-20 DIAGNOSIS — I110 Hypertensive heart disease with heart failure: Secondary | ICD-10-CM | POA: Insufficient documentation

## 2022-02-20 DIAGNOSIS — G620 Drug-induced polyneuropathy: Secondary | ICD-10-CM | POA: Insufficient documentation

## 2022-02-20 DIAGNOSIS — T451X5A Adverse effect of antineoplastic and immunosuppressive drugs, initial encounter: Secondary | ICD-10-CM | POA: Diagnosis not present

## 2022-02-20 MED ORDER — PROCHLORPERAZINE MALEATE 10 MG PO TABS: 10.0000 mg | ORAL_TABLET | Freq: Four times a day (QID) | ORAL | Status: DC | PRN

## 2022-02-20 NOTE — Progress Notes (Signed)
Per verbal order from Dr. Burr Medico, place order for Prochlorperazine '10mg'$  Q6hrs PRN.  We will continued to monitor his QT d/t drug interaction with Tibsovo. ?

## 2022-02-27 ENCOUNTER — Inpatient Hospital Stay: Payer: Medicare HMO

## 2022-02-27 ENCOUNTER — Other Ambulatory Visit: Payer: Self-pay

## 2022-02-27 DIAGNOSIS — C221 Intrahepatic bile duct carcinoma: Secondary | ICD-10-CM | POA: Diagnosis not present

## 2022-02-27 DIAGNOSIS — I110 Hypertensive heart disease with heart failure: Secondary | ICD-10-CM | POA: Diagnosis not present

## 2022-02-27 DIAGNOSIS — G620 Drug-induced polyneuropathy: Secondary | ICD-10-CM | POA: Diagnosis not present

## 2022-02-27 DIAGNOSIS — Z87891 Personal history of nicotine dependence: Secondary | ICD-10-CM | POA: Diagnosis not present

## 2022-02-27 DIAGNOSIS — T451X5A Adverse effect of antineoplastic and immunosuppressive drugs, initial encounter: Secondary | ICD-10-CM | POA: Diagnosis not present

## 2022-02-27 DIAGNOSIS — Z9221 Personal history of antineoplastic chemotherapy: Secondary | ICD-10-CM | POA: Diagnosis not present

## 2022-02-27 DIAGNOSIS — Z79899 Other long term (current) drug therapy: Secondary | ICD-10-CM | POA: Diagnosis not present

## 2022-02-27 DIAGNOSIS — I4891 Unspecified atrial fibrillation: Secondary | ICD-10-CM | POA: Diagnosis not present

## 2022-02-28 ENCOUNTER — Telehealth: Payer: Self-pay | Admitting: Pharmacist

## 2022-02-28 NOTE — Telephone Encounter (Signed)
Patient's wife called and reported that his systolic blood pressure has been trending up recently.  Was controlled however now is in 140s-150s.  Diastolic BP remains in 26E.  Advised patient to increase hydralazine to '75mg'$  BID for a week to see if this helps bring BP down. ?

## 2022-03-14 ENCOUNTER — Inpatient Hospital Stay: Payer: Medicare HMO

## 2022-03-14 ENCOUNTER — Inpatient Hospital Stay: Payer: Medicare HMO | Admitting: Hematology

## 2022-03-14 ENCOUNTER — Encounter: Payer: Self-pay | Admitting: Hematology

## 2022-03-14 ENCOUNTER — Other Ambulatory Visit: Payer: Self-pay

## 2022-03-14 VITALS — BP 143/68 | HR 68 | Temp 98.0°F | Resp 16 | Ht 69.0 in | Wt 214.7 lb

## 2022-03-14 DIAGNOSIS — Z87891 Personal history of nicotine dependence: Secondary | ICD-10-CM | POA: Diagnosis not present

## 2022-03-14 DIAGNOSIS — C221 Intrahepatic bile duct carcinoma: Secondary | ICD-10-CM

## 2022-03-14 DIAGNOSIS — Z9221 Personal history of antineoplastic chemotherapy: Secondary | ICD-10-CM | POA: Diagnosis not present

## 2022-03-14 DIAGNOSIS — Z79899 Other long term (current) drug therapy: Secondary | ICD-10-CM | POA: Diagnosis not present

## 2022-03-14 DIAGNOSIS — I4891 Unspecified atrial fibrillation: Secondary | ICD-10-CM | POA: Diagnosis not present

## 2022-03-14 DIAGNOSIS — Z95828 Presence of other vascular implants and grafts: Secondary | ICD-10-CM

## 2022-03-14 DIAGNOSIS — T451X5A Adverse effect of antineoplastic and immunosuppressive drugs, initial encounter: Secondary | ICD-10-CM | POA: Diagnosis not present

## 2022-03-14 DIAGNOSIS — G620 Drug-induced polyneuropathy: Secondary | ICD-10-CM | POA: Diagnosis not present

## 2022-03-14 DIAGNOSIS — I110 Hypertensive heart disease with heart failure: Secondary | ICD-10-CM | POA: Diagnosis not present

## 2022-03-14 LAB — CBC WITH DIFFERENTIAL (CANCER CENTER ONLY)
Abs Immature Granulocytes: 0.02 10*3/uL (ref 0.00–0.07)
Basophils Absolute: 0 10*3/uL (ref 0.0–0.1)
Basophils Relative: 1 %
Eosinophils Absolute: 0.1 10*3/uL (ref 0.0–0.5)
Eosinophils Relative: 1 %
HCT: 32.7 % — ABNORMAL LOW (ref 39.0–52.0)
Hemoglobin: 10.4 g/dL — ABNORMAL LOW (ref 13.0–17.0)
Immature Granulocytes: 1 %
Lymphocytes Relative: 17 %
Lymphs Abs: 0.6 10*3/uL — ABNORMAL LOW (ref 0.7–4.0)
MCH: 30.1 pg (ref 26.0–34.0)
MCHC: 31.8 g/dL (ref 30.0–36.0)
MCV: 94.8 fL (ref 80.0–100.0)
Monocytes Absolute: 0.4 10*3/uL (ref 0.1–1.0)
Monocytes Relative: 10 %
Neutro Abs: 2.6 10*3/uL (ref 1.7–7.7)
Neutrophils Relative %: 70 %
Platelet Count: 115 10*3/uL — ABNORMAL LOW (ref 150–400)
RBC: 3.45 MIL/uL — ABNORMAL LOW (ref 4.22–5.81)
RDW: 15.2 % (ref 11.5–15.5)
WBC Count: 3.7 10*3/uL — ABNORMAL LOW (ref 4.0–10.5)
nRBC: 0 % (ref 0.0–0.2)

## 2022-03-14 LAB — CMP (CANCER CENTER ONLY)
ALT: 18 U/L (ref 0–44)
AST: 23 U/L (ref 15–41)
Albumin: 3.9 g/dL (ref 3.5–5.0)
Alkaline Phosphatase: 62 U/L (ref 38–126)
Anion gap: 5 (ref 5–15)
BUN: 20 mg/dL (ref 8–23)
CO2: 26 mmol/L (ref 22–32)
Calcium: 9.2 mg/dL (ref 8.9–10.3)
Chloride: 107 mmol/L (ref 98–111)
Creatinine: 1.33 mg/dL — ABNORMAL HIGH (ref 0.61–1.24)
GFR, Estimated: 56 mL/min — ABNORMAL LOW (ref >60)
Glucose, Bld: 164 mg/dL — ABNORMAL HIGH (ref 70–99)
Potassium: 3.6 mmol/L (ref 3.5–5.1)
Sodium: 138 mmol/L (ref 135–145)
Total Bilirubin: 0.4 mg/dL (ref 0.3–1.2)
Total Protein: 6.8 g/dL (ref 6.5–8.1)

## 2022-03-14 LAB — MAGNESIUM: Magnesium: 1.8 mg/dL (ref 1.7–2.4)

## 2022-03-14 MED ORDER — HEPARIN SOD (PORK) LOCK FLUSH 100 UNIT/ML IV SOLN
500.0000 [IU] | Freq: Once | INTRAVENOUS | Status: AC
  Administered 2022-03-14: 500 [IU]

## 2022-03-14 MED ORDER — SODIUM CHLORIDE 0.9% FLUSH
10.0000 mL | Freq: Once | INTRAVENOUS | Status: AC
  Administered 2022-03-14: 10 mL

## 2022-03-14 NOTE — Progress Notes (Signed)
?[facility]   ?[phone] [phone]   ?Clinic Follow up Note  ? ?Patient Care Team: ?Canary Brim, MD as PCP - General (Internal Medicine) ?Lorretta Harp, MD as PCP - Cardiology (Cardiology) ?[name], MD as Consulting Physician (General Surgery) ?[name], [name], MD as Consulting Physician (Gastroenterology) ?[name], MD as Consulting Physician (Hematology) ?Lorretta Harp, MD as Consulting Physician (Cardiology) ? ?Date of Service:  03/14/2022 ? ?CHIEF COMPLAINT: f/u of cholangiocarcinoma ? ?CURRENT THERAPY:  ?4th line Ivosidenib, started 02/02/22 ? ?ASSESSMENT & PLAN:  ?Brandon Sherman is a 76 y.o. male with  ? ?1. Intrahepatic cholangiocarcinoma, cT2N0Mx, unresectable, with indeterminate lung nodules, IDH mutation (+) ?-Diagnosed in 07/2019. CT scans and MRI liver show a large 7.3cm mass in the right hepatic lobe which abuts portal vein. Both our local surgeon Dr. Barry Dienes and Dr Carlis Abbott at [facility] concluded that cancer is not resectable, and therefore not likely curable, due to the invasion to portal vein. ?-He received palliative systemic treatment, standard first line chemo with IV Cisplatin and Gemcitabine 2 weeks on/1 week off, 09/29/19-07/19/20. Cisplatin discontinued 03/23/20 due to Afib. ?-His FO results showed MSI stable disease, IDH1 mutations he may benefit from IDH inhibitor  ?-he progressed through single agent gemcitabine (12/24/20-03/16/21), FOLFOX plus durvalumab (04/05/21-10/21/21), and recently FOLFIRI (11/01/21-01/19/22). ?-he was switched to ivosidenib on 02/02/22. He is tolerating well with no noticeable side effects. EKG has been unremarkable, I personally reviewed. He has been feeling better since he came off chemo  ?-labs reviewed, CBC is overall stable, though his WBC has been slowly dropping over the last few months. We will plan for restaging scans in 04/2022. ?  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 ?-mild and stable ?  ?3. Goal of care discussion  ?-We again discussed the incurable nature of his cancer, and the overall poor prognosis, especially if he does not have good response to chemotherapy or progress on chemo ?-The patient understands the goal of care is palliative. ?-DNR/DNI signed 02/13/22. A copy of his living will has been scanned into his chart. ?  ?4. Afib, CHF ?-diagnosed after PAC placement on 02/23/20. ?-CHADS2 score 2, moderate risk for stroke. Started on metoprolol and eliquis on 03/01/20. Tolerating well without bleeding.  ?-He had cardioversion on 05/19/20 ?-he is now off diltiazem and has increased his metoprolol, per cardiologist Dr. Gwenlyn Found. ?  ?5. DM, HTN, HLD, Gout ?-On Metformin, amlodipine, lisinopril, lasix, allopurinol ?-Continue to f/u with his PCP  ?-I previously reduced lasix to 1 tab BID rather than 2 tabs BID ?-monitoring BP and BG ?  ?6. Nicotine Use ?-He never smoked but has been chewing Tobacco for the past 60 years. He no longer drinks alcohol and tells me he has quit tobacco use (11/25/19).  ?  ?7. GERD and gastritis, history of esophageal candidiasis ?-He had repeated EGD with Dr. Havery Moros in 07/2019. 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 May repeat EGD in future  ?-GERD resolved on PPI and carafate ?  ?  ?Plan: ?-continue ivosidenib, he is tolerating well  ?-f/u in 6 weeks, with lab and CT chest, abdominal  MRI several days before ? ? ?No problem-specific Assessment & Plan notes found for this encounter. ? ? ?SUMMARY OF ONCOLOGIC HISTORY: ?Oncology History Overview Note  ?Cancer Staging ?Intrahepatic cholangiocarcinoma ([place]) ?Staging form:  Intrahepatic Bile Duct, AJCC 8th Edition ?- Clinical stage from 08/19/2019: Stage II (cT2, cN0, cM0) - Signed by [name], MD on 08/19/2019 ? ?  ?Intrahepatic cholangiocarcinoma ([place])  ?06/28/2019 Imaging   ? CT AP W Contrast 06/28/19  ?IMPRESSION: ?1. Heterogeneous hypodensity posteriorly in the right hepatic lobe ?and potentially extending into the caudate lobe suspicious for a ?mass. There is felt to be truncation of branches of the portal vein ?in this vicinity and some narrowing of the hepatic vein, as well as ?triangular-shaped regions of abnormal hypoenhancement posteriorly in the right hepatic lobe likely representing downstream vascular effects. Cannot exclude malignancy such as cholangiocarcinoma or hepatocellular carcinoma, and follow up hepatic protocol MRI with and without contrast is recommended to further characterize. ?2. 4 mm right middle lobe pulmonary nodule is likely benign but may merit surveillance. ?3. Cholelithiasis. ?4.  Aortic Atherosclerosis (ICD10-I70.0). ?5. Prostatomegaly. ?6. Mild impingement at L3-4 and L4-5. ?  ?07/31/2019 Imaging  ? MRI Liver 07/31/19 ?IMPRESSION: ?1. 7.3 cm in long axis mass in the right hepatic lobe spanning into ?the caudate lobe, high suspicion for malignancy such as ?hepatocellular carcinoma or cholangiocarcinoma. Suspected effacement ?or occlusion of the right hepatic vein and posterior branches of the ?right portal vein. Two smaller tumor nodules along the posterior ?periphery of the dominant mass. Tissue diagnosis is recommended. ?2. No findings of pathologic adenopathy or distant metastatic ?spread. ?3. 9 mm gallstone in the gallbladder. There is mild gallbladder wall ?thickening which may be from nondistention, correlate clinically in ?assessing for cholecystitis. ?4.  Aortic Atherosclerosis (ICD10-I70.0). ?5. Mild diffuse hepatic steatosis. ?  ?08/11/2019 Initial Biopsy  ? DIAGNOSIS: 08/11/19  ?A. LIVER, RIGHT, BIOPSY:  ?- Adenocarcinoma. ?  ?08/18/2019 Imaging  ? CT Chest 08/18/19  ?IMPRESSION: ?1. Multiple pulmonary nodules largest at approximately 7 mm in the ?right lower lobe, nonspecific but concerning given findings in the ?liver. ?2. No signs of definitive  metastatic disease, also with mildly ?enlarged upper abdominal lymph nodes as discussed. ?  ?Aortic Atherosclerosis (ICD10-I70.0). ?  ?08/19/2019 Initial Diagnosis  ? Intrahepatic cholangiocarcinoma ([place]) ? ?  ?08/19/2019 Cancer Staging  ? Staging form: Intrahepatic Bile Duct, AJCC 8th Edition ?- Clinical stage from 08/19/2019: Stage II (cT2, cN0, cM0) - Signed by [name], MD on 08/19/2019 ? ?  ?09/29/2019 - 07/19/2020 Chemotherapy  ? Cisplatin and Gemcitabine 2 weeks on/1 week off starting 09/29/19. Cisplatin held from cycle 9 (03/23/20) due to fluid status/Afib. He is now on maintenance Gemcitabine. Held after 07/19/20 to proceed with liver target therapy.  ? ?  ?10/09/2019 Imaging  ? CT AP IMPRESSION: ?1. The dominant right hepatic lobe mass is minimally reduced in size compared to prior exams, currently measuring 6.2 by 5.3 cm, previously 6.2 by 5.6 cm. However, there is a new small hypodense lesion centrally in the right hepatic lobe which is suspicious for a new small focus of tumor. Accordingly this is an overall mixed appearance. ?2. Continued hypoenhancement in the liver downstream of the tumor likely attributable to narrowing or occlusion of the right hepatic vein by the tumor. By virtue of its location the tumor wraps around ?the intrahepatic portion of the IVC. ?3. 4 mm right middle lobe pulmonary nodule, stable compared to earliest available comparison of 07/18/2019. Surveillance of the patient's pulmonary nodules is recommended. ?4. Other imaging findings of potential clinical significance: Coronary atherosclerosis. Cholelithiasis. Prominent stool throughout the colon favors constipation. Moderate prostatomegaly with ?heterogeneous enhancement of the prostate gland. Lumbar spondylosis and degenerative disc disease causing  mild bilateral foraminal impingement at L3-4 and L4-5. ?  ?Aortic Atherosclerosis (ICD10-I70.0). ?  ?12/24/2019 Imaging  ? CT CAP W Contrast  ?IMPRESSION: ?1. Right hepatic lobe mass and  adjacent right hepatic lobe nodules ?appear grossly stable. No evidence of distant metastatic disease. ?2. Continued stability of small pulmonary nodules. Recommend ?attention on follow-up. ?3. Cholelithiasi

## 2022-03-27 ENCOUNTER — Other Ambulatory Visit: Payer: Self-pay | Admitting: Hematology

## 2022-03-27 ENCOUNTER — Other Ambulatory Visit: Payer: Self-pay | Admitting: Cardiovascular Disease

## 2022-03-27 DIAGNOSIS — C221 Intrahepatic bile duct carcinoma: Secondary | ICD-10-CM

## 2022-03-27 DIAGNOSIS — Z7189 Other specified counseling: Secondary | ICD-10-CM

## 2022-03-28 DIAGNOSIS — I129 Hypertensive chronic kidney disease with stage 1 through stage 4 chronic kidney disease, or unspecified chronic kidney disease: Secondary | ICD-10-CM | POA: Diagnosis not present

## 2022-03-28 DIAGNOSIS — K219 Gastro-esophageal reflux disease without esophagitis: Secondary | ICD-10-CM | POA: Diagnosis not present

## 2022-03-28 DIAGNOSIS — I509 Heart failure, unspecified: Secondary | ICD-10-CM | POA: Diagnosis not present

## 2022-03-28 DIAGNOSIS — I4891 Unspecified atrial fibrillation: Secondary | ICD-10-CM | POA: Diagnosis not present

## 2022-03-28 DIAGNOSIS — N182 Chronic kidney disease, stage 2 (mild): Secondary | ICD-10-CM | POA: Diagnosis not present

## 2022-03-28 DIAGNOSIS — C221 Intrahepatic bile duct carcinoma: Secondary | ICD-10-CM | POA: Diagnosis not present

## 2022-03-28 DIAGNOSIS — M109 Gout, unspecified: Secondary | ICD-10-CM | POA: Diagnosis not present

## 2022-03-28 DIAGNOSIS — E1122 Type 2 diabetes mellitus with diabetic chronic kidney disease: Secondary | ICD-10-CM | POA: Diagnosis not present

## 2022-03-28 DIAGNOSIS — E785 Hyperlipidemia, unspecified: Secondary | ICD-10-CM | POA: Diagnosis not present

## 2022-03-28 DIAGNOSIS — I10 Essential (primary) hypertension: Secondary | ICD-10-CM | POA: Diagnosis not present

## 2022-04-04 ENCOUNTER — Other Ambulatory Visit: Payer: Self-pay | Admitting: Cardiovascular Disease

## 2022-04-24 ENCOUNTER — Ambulatory Visit (HOSPITAL_COMMUNITY)
Admission: RE | Admit: 2022-04-24 | Discharge: 2022-04-24 | Disposition: A | Discharge status: Home / Self Care | Payer: Medicare HMO | Source: Ambulatory Visit | Attending: Hematology | Admitting: Hematology

## 2022-04-24 ENCOUNTER — Other Ambulatory Visit: Payer: Self-pay | Admitting: Hematology

## 2022-04-24 DIAGNOSIS — Z8505 Personal history of malignant neoplasm of liver: Secondary | ICD-10-CM | POA: Diagnosis not present

## 2022-04-24 DIAGNOSIS — C221 Intrahepatic bile duct carcinoma: Secondary | ICD-10-CM | POA: Diagnosis not present

## 2022-04-24 DIAGNOSIS — K862 Cyst of pancreas: Secondary | ICD-10-CM | POA: Diagnosis not present

## 2022-04-24 DIAGNOSIS — N281 Cyst of kidney, acquired: Secondary | ICD-10-CM | POA: Diagnosis not present

## 2022-04-24 DIAGNOSIS — K8020 Calculus of gallbladder without cholecystitis without obstruction: Secondary | ICD-10-CM | POA: Diagnosis not present

## 2022-04-24 DIAGNOSIS — I700 Atherosclerosis of aorta: Secondary | ICD-10-CM | POA: Diagnosis not present

## 2022-04-24 DIAGNOSIS — R918 Other nonspecific abnormal finding of lung field: Secondary | ICD-10-CM | POA: Diagnosis not present

## 2022-04-24 IMAGING — MR MR 3D RECON AT SCANNER
12 of 18 series · 31 of 48 positions shown · IV contrast (gadavist)
Comparison: Abdominal MRI [DATE].

CLINICAL DATA: 76-year-old male with history of cholangiocarcinoma.
Follow-up study.

EXAM:
MRI ABDOMEN WITHOUT AND WITH CONTRAST (INCLUDING MRCP)
TECHNIQUE: Multiplanar multisequence MR imaging of the abdomen was performed
both before and after the administration of intravenous contrast.
Heavily T2-weighted images of the biliary and pancreatic ducts were
obtained, and three-dimensional MRCP images were rendered by post
processing.
CONTRAST:  10mL GADAVIST GADOBUTROL 1 MMOL/ML IV SOLN

[Series 4: T2 fat-sat · axial · 6.0mm · 1.25mm/px · 1 of 42 slices shown]
[im 1/42]
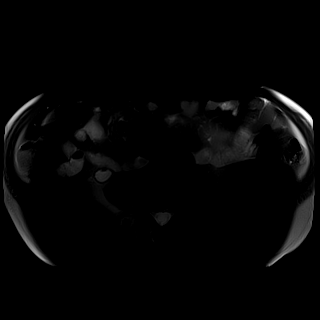

[Series 7: T2 · coronal · 6.0mm · 1.76mm/px · 1 of 38 slices shown (1 of 2)]
[im 1/38]
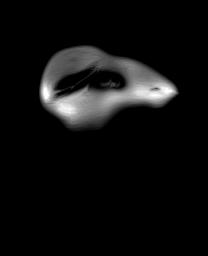

[Series 8: DWI · axial · 6.0mm · 1.49mm/px · z∈[-131,+178]mm · 3 of 88 slices shown (1 of 2)]
[im 1/88]
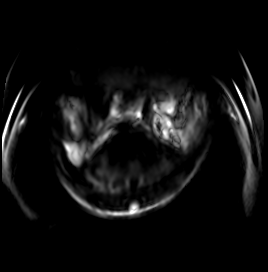
[im 44/88]
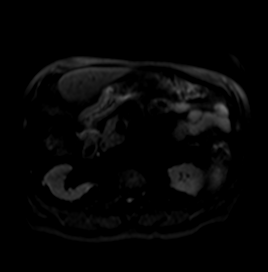
[im 88/88]
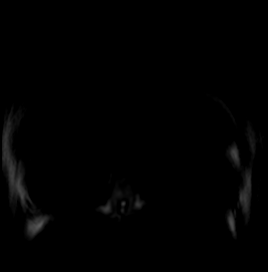

[Series 9: DWI · axial · 6.0mm · 1.49mm/px · z∈[-131,+178]mm · 2 of 44 slices shown (2 of 2)]
[im 1/44]
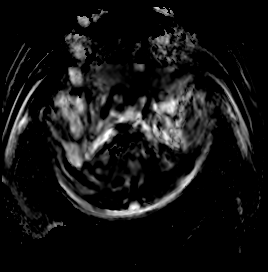
[im 44/44]
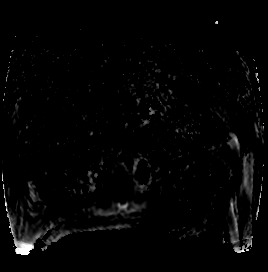

[Series 10: T1 · axial · 3.0mm · 1.25mm/px · z∈[-114,+171]mm · 4 of 96 slices shown (1 of 2)]
[im 1/96]
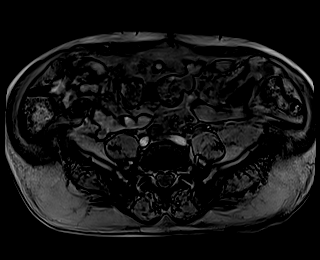
[im 32/96]
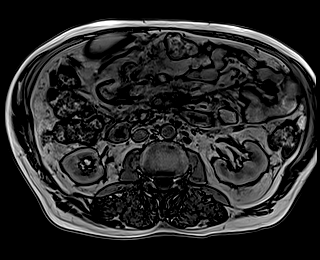
[im 64/96]
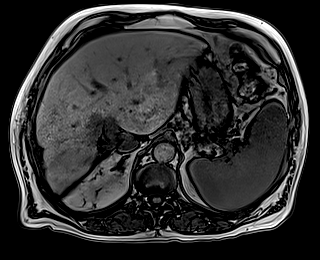
[im 96/96]
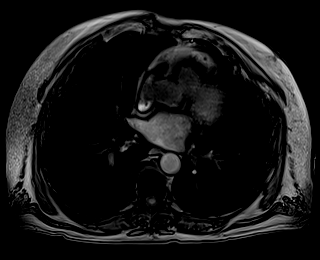

[Series 11: T1 · axial · 3.0mm · 1.25mm/px · z∈[-114,+171]mm · 4 of 96 slices shown (2 of 2)]
[im 1/96]
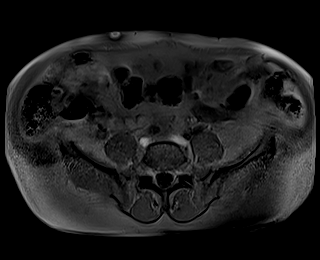
[im 32/96]
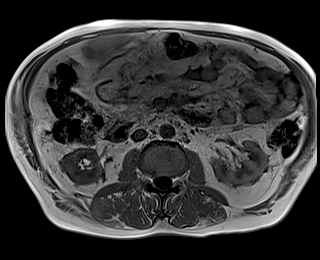
[im 64/96]
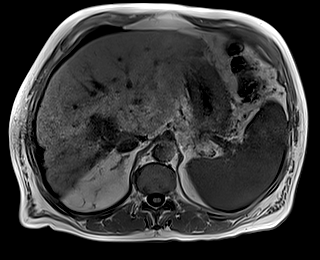
[im 96/96]
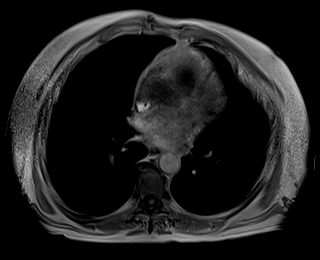

[Series 12: cor obl thk · sagittal · 50.0mm · 0.78mm/px · 1 of 9 slices shown]
[im 1/9]
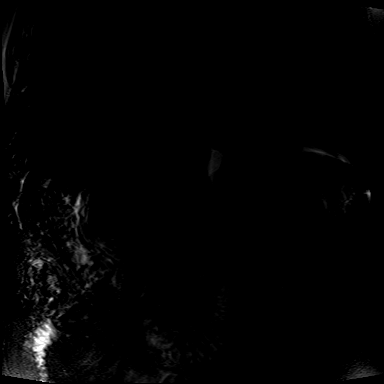

[Series 14: cor_3d_spc_trig · coronal · 1.0mm · 0.49mm/px · 3 of 72 slices shown]
[im 1/72]
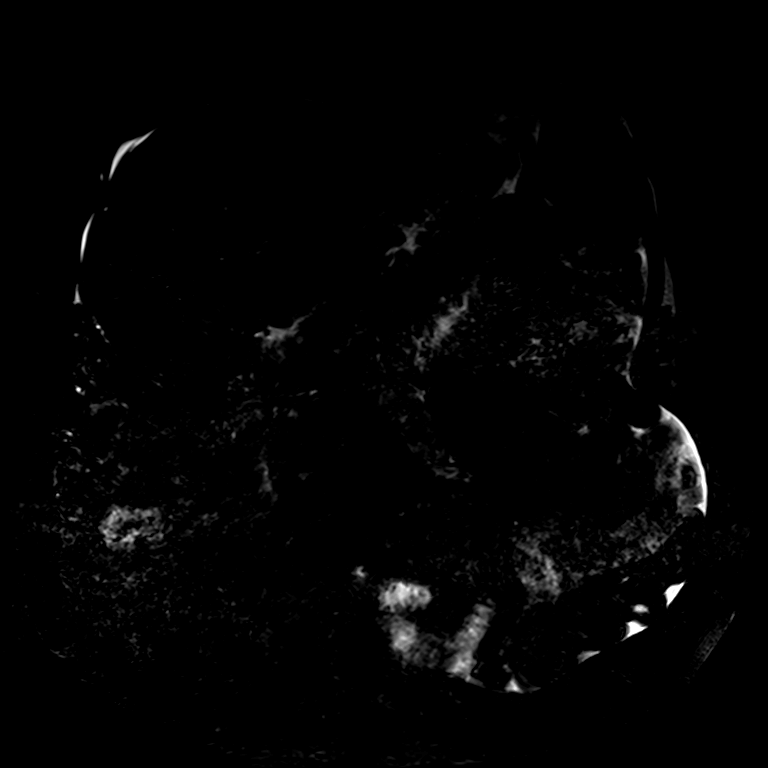
[im 36/72]
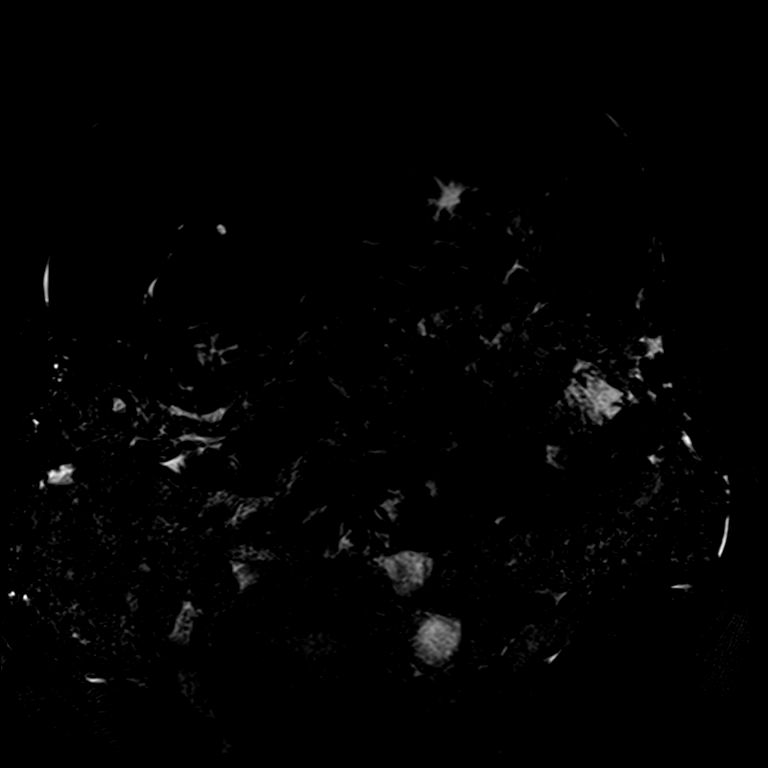
[im 72/72]
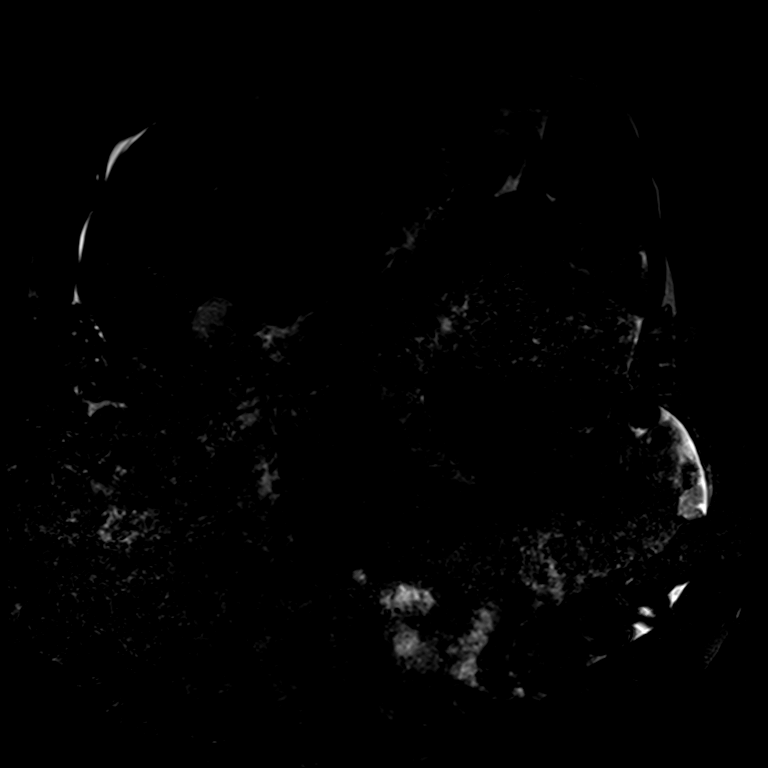

[Series 16: T2 · axial · 6.0mm · 1.56mm/px · z∈[-104,+162]mm · 2 of 38 slices shown (2 of 2)]
[im 1/38]
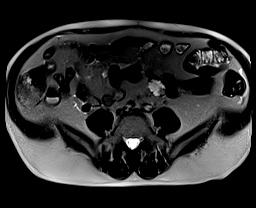
[im 38/38]
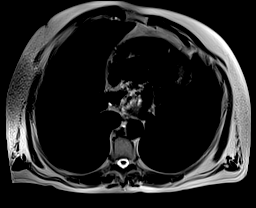

[Series 18: T1 dynamic · axial · 3.0mm · 1.25mm/px · z∈[-96,+189]mm · 4 of 96 slices shown (1 of 3)]
[im 1/96]
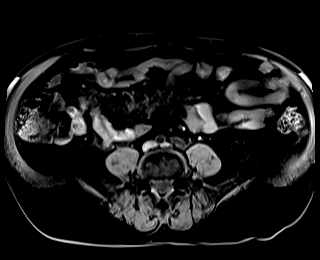
[im 32/96]
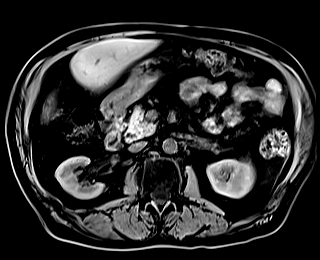
[im 64/96]
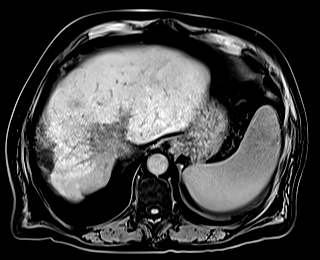
[im 96/96]
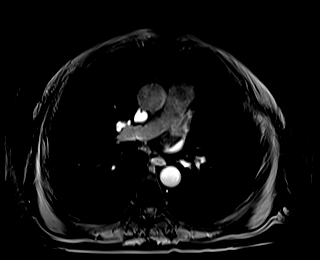

[Series 22: T1 dynamic · axial · 3.0mm · 1.25mm/px · z∈[-102,+183]mm · 4 of 96 slices shown (2 of 3)]
[im 1/96]
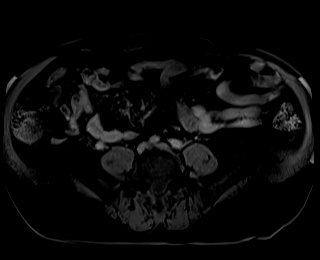
[im 32/96]
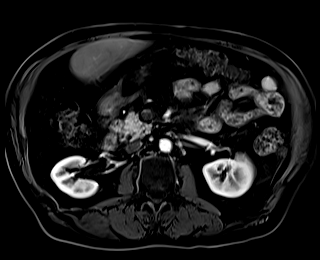
[im 64/96]
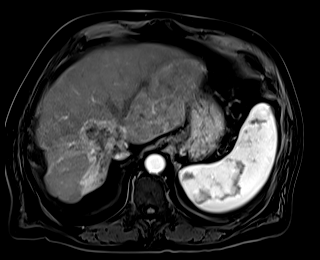
[im 96/96]
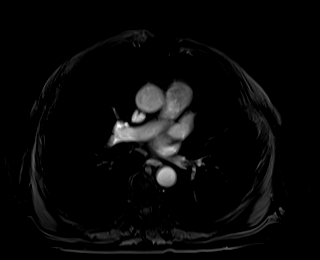

[Series 24: T1 dynamic · axial · 3.0mm · 1.25mm/px · z∈[-119,-26]mm · 2 of 96 slices shown (3 of 3)]
[im 1/96]
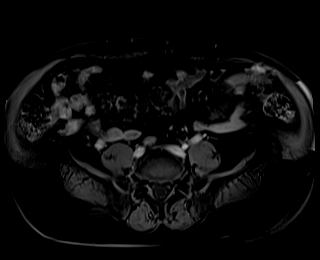
[im 32/96]
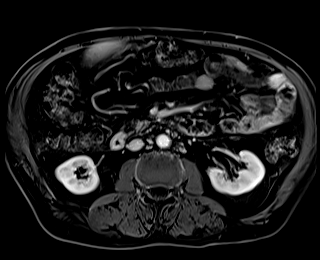

[31 of 48 positions shown; findings below may reference images not displayed]

FINDINGS: Lower chest: Please see dictation for contemporaneously obtained CT
of the chest for full description of findings above the diaphragm.
There is clearly a right lower lobe pulmonary nodule on today's
study.

Hepatobiliary: Dominant mass in the central aspect of the right lobe
of the liver predominantly involving segments 7 and 8 (axial image
23 of series 24 and coronal image 51 of series 26) appears roughly
similar to the prior examination, estimated to measure approximately
5.7 x 4.5 x 5.1 cm on today's examination. This lesion is
predominantly low T1 signal intensity, heterogeneously high T2
signal intensity, demonstrates some areas of internal restriction
and demonstrates avid post gadolinium enhancement with persistence
on delayed imaging, but central areas that demonstrate a paucity of
enhancement, suggesting some internal necrosis. Surrounding
hypoperfusion is once again noted on early arterial phase images,
suggesting some associated perfusion anomaly. Notably, this lesion
is centrally located involving portions of the middle and right
hepatic vein, with apparent extension into the IVC (filling defect
noted on axial image 19 of series 2), which likely reflects some
tumor thrombus. A dominant satellite lesion is again noted in the
superior aspect of segment 8 of the liver (axial image 17 of series
24 and coronal image 38 of series 26) measuring 2.6 x 2.5 x 2.3 cm,
increased from 1.9 cm on the recent prior study. No other definite
new hepatic lesions are noted. Increasing atrophy of segments 6 and
7 in the liver is noted. MRCP images demonstrate no intra or
extrahepatic biliary ductal dilatation. Common bile duct measures 5
mm in the porta hepatis. No filling defect in the common bile duct
to suggest choledocholithiasis. Gallbladder is nearly completely
contracted. There is a filling defect in the fundus of the
gallbladder measuring 12 mm, indicative of a gallstone. Gallbladder
wall appears mildly thickened and edematous, without frank overt
surrounding inflammatory changes, likely secondary to intrinsic
liver disease (cholecystitis is not favored at this time).

Pancreas: In the head of the pancreas there is a small 7 mm lesion
(axial image 25 of series 15) which is low T1 signal intensity, high
T2 signal intensity, and does not appear to enhance, stable in
retrospect compared to the prior examination. No other new
pancreatic mass. No pancreatic ductal dilatation noted on MRCP
images. Mild T2 hyperintensity surrounding the pancreas without
well-defined peripancreatic fluid collections.

Spleen: Spleen is enlarged measuring 15.7 x 6.2 x 12.5 cm (estimated
splenic volume of 608 mL) .

Adrenals/Urinary Tract: In the posterior aspect of the interpolar
region of the left kidney, and in the upper poles of both kidneys
there are tiny subcentimeter lesions which are T1 hyperintense,
variable T2 signal intensity ranging from hyperintense to
hypointense, which do not enhance, compatible with small
proteinaceous/hemorrhagic (Bosniak class 2) cysts. No other
aggressive appearing renal lesions. No hydroureteronephrosis in the
visualized portions of the abdomen. Bilateral adrenal glands are
normal in appearance.

Stomach/Bowel: Visualized portions are unremarkable.

Vascular/Lymphatic: Aortic atherosclerosis, without evidence of
aneurysm in the abdominal vasculature. Portal vein is patent
measuring 13 mm in diameter. No definite lymphadenopathy noted in
the abdomen.

Other:  Small volume of ascites.

Musculoskeletal: Visualized portions are unremarkable.
IMPRESSION: 1. Dominant mass in the central aspect of the liver appears
relatively similar in size, although there appears to be increased
vascular involvement in the central liver with probable tumor or
tumor thrombus invasion of the right hepatic vein, middle hepatic
vein and extension into the inferior vena cava, as detailed above.
Satellite tumor in segment 8 of the liver has enlarged compared to
the prior study. Overall, findings are indicative of progression of
disease.
2. Cholelithiasis without definitive evidence to suggest acute
cholecystitis at this time.
3. Trace amount of T2 hyperintensity surrounding the pancreas. This
is very subtle, and could be related to intrinsic liver disease, but
correlation with lipase levels is recommended to exclude an acute
pancreatitis.
4. Trace volume of ascites.
5. Splenomegaly.
6. Small Bosniak class 2 cysts in both kidneys incidentally noted.
7. 7 mm cystic lesion in the head of the pancreas, stable in
retrospect compared to the prior study, favored to represent a
benign lesions such as a tiny pancreatic pseudocyst or side branch
IPMN (intraductal papillary mucinous neoplasm). Attention at time of
routine follow-up imaging is recommended to ensure stability.

## 2022-04-24 IMAGING — CT CT CHEST W/O CM
2 of 4 series · 13 of 36 positions shown, 16 images · non-contrast
Comparison: Chest CT [DATE].

CLINICAL DATA: 76-year-old male with history of intrahepatic
cholangiocarcinoma. Pulmonary nodule. Followup study.



[Series 2: thorax · axial · 0.88mm/px · z∈[+1531,+1767]mm · 10 of 140 slices shown, 13 images]
[im 11/140  mediastinal]
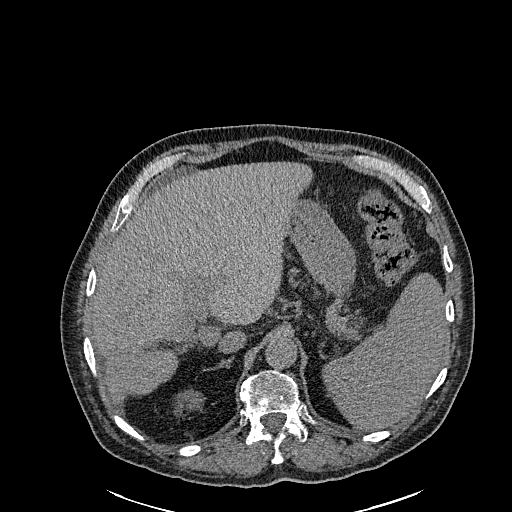
[im 11/140  lung]
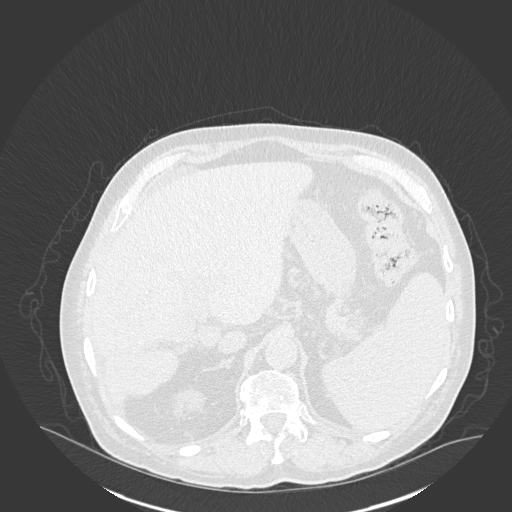
[im 22/140  lung]
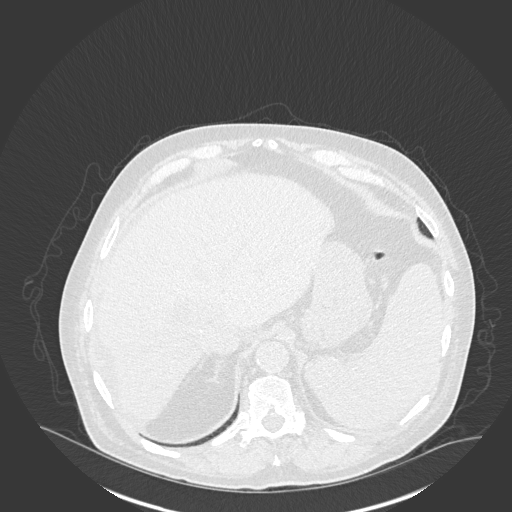
[im 43/140  lung]
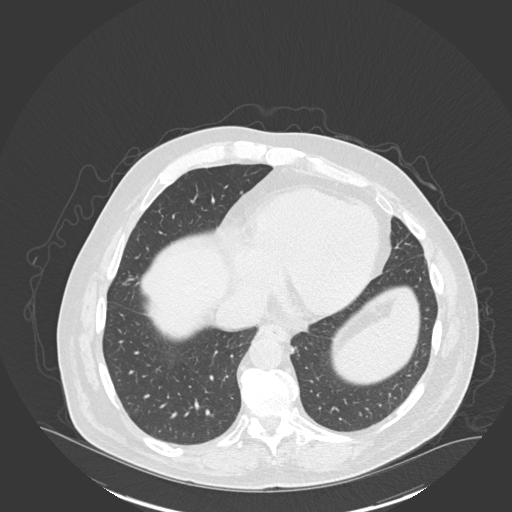
[im 54/140  lung]
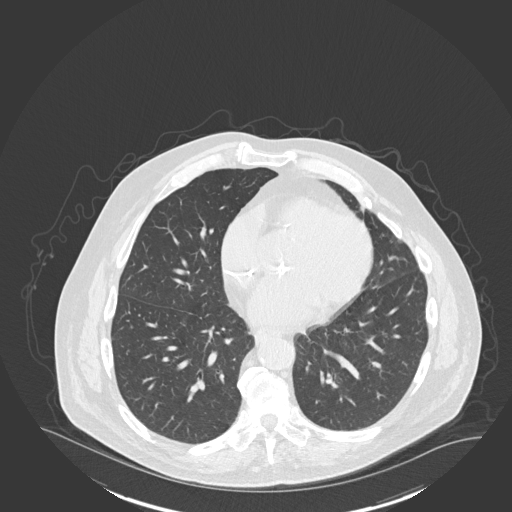
[im 65/140  mediastinal]
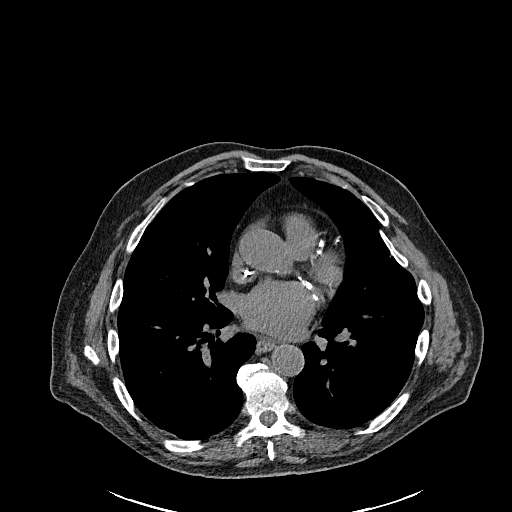
[im 65/140  lung]
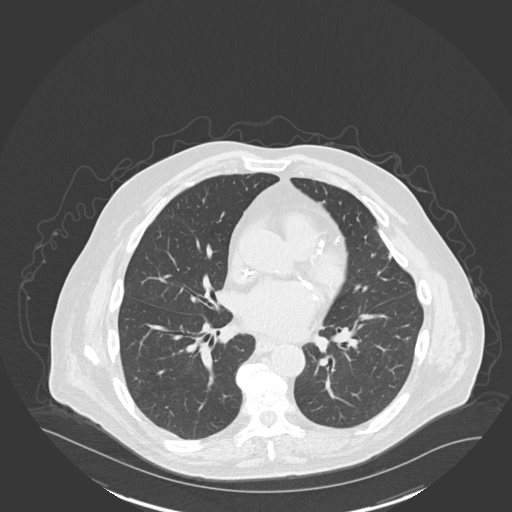
[im 75/140  lung]
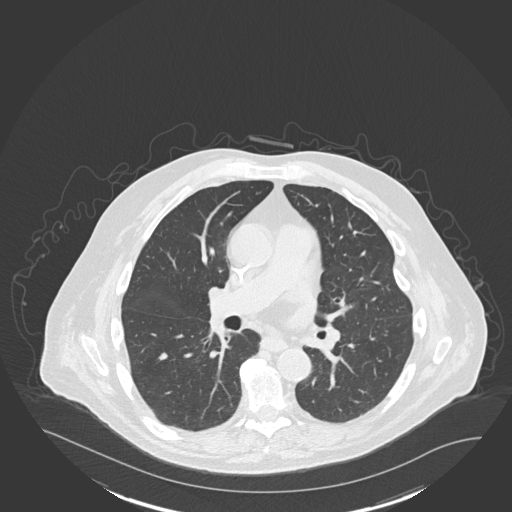
[im 86/140  lung]
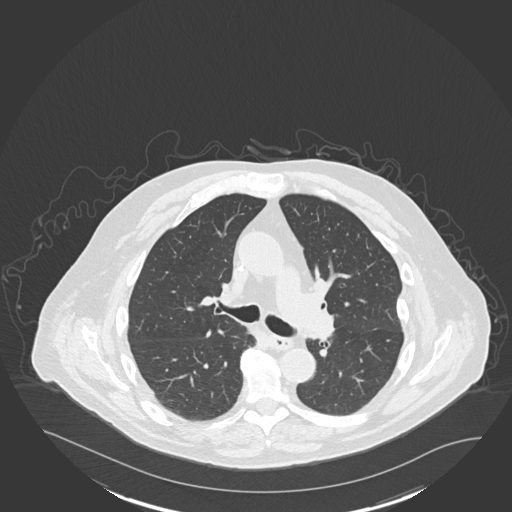
[im 107/140  lung]
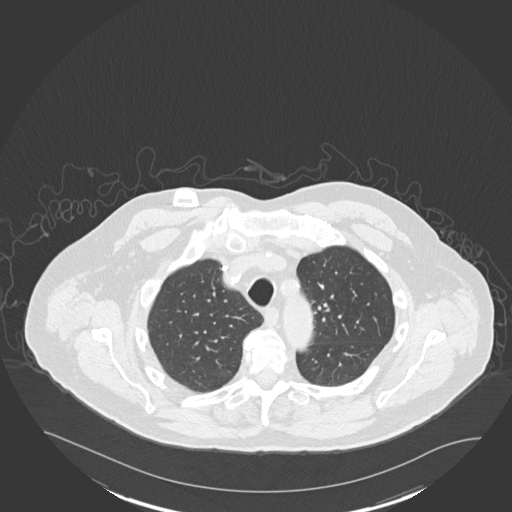
[im 118/140  mediastinal]
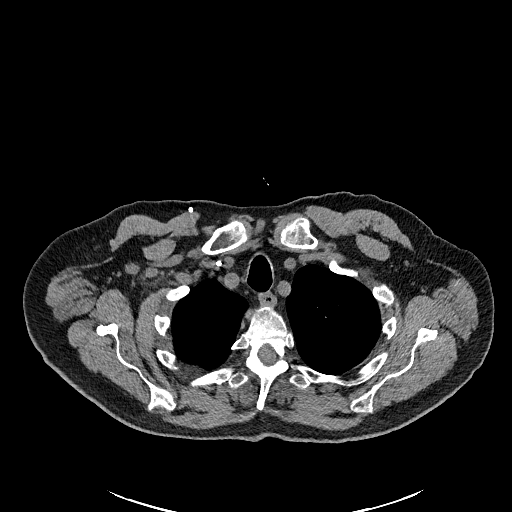
[im 118/140  lung]
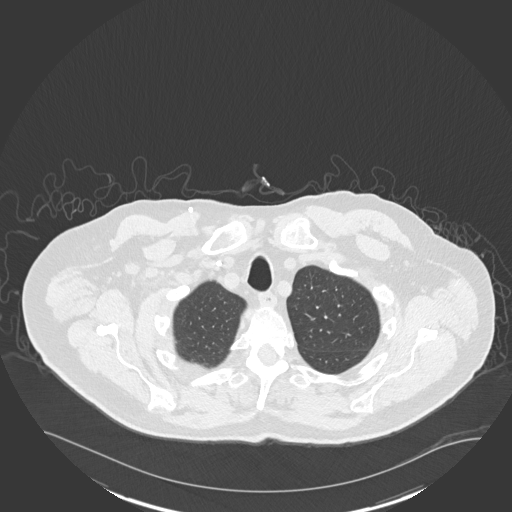
[im 129/140  lung]
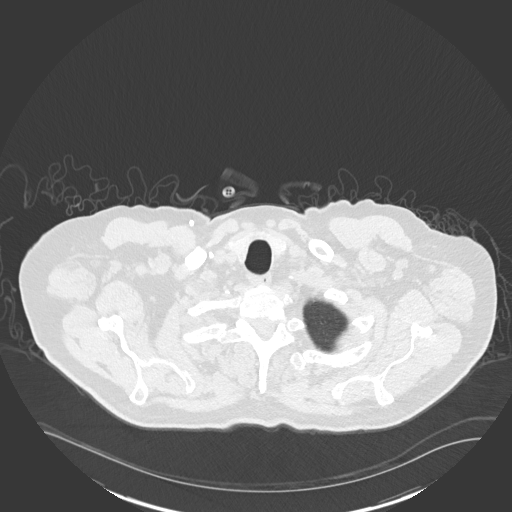

[Series 6: coronal · coronal · 0.58mm/px · 3 of 175 slices shown]
[im 35/175  lung]
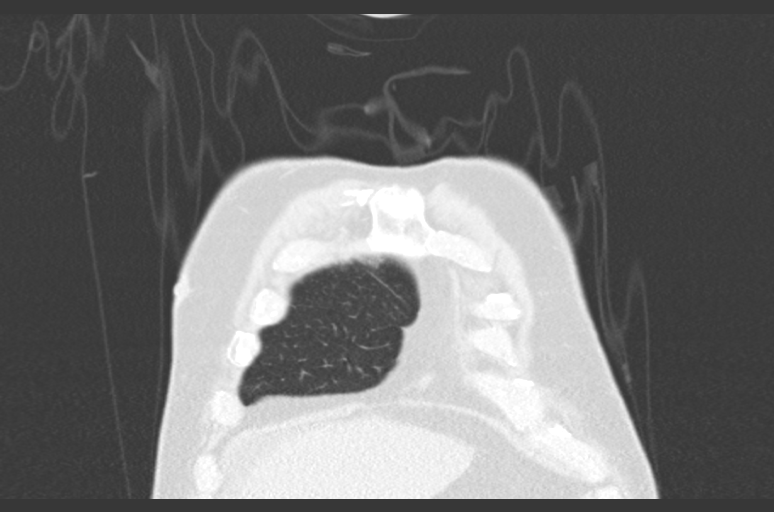
[im 70/175  lung]
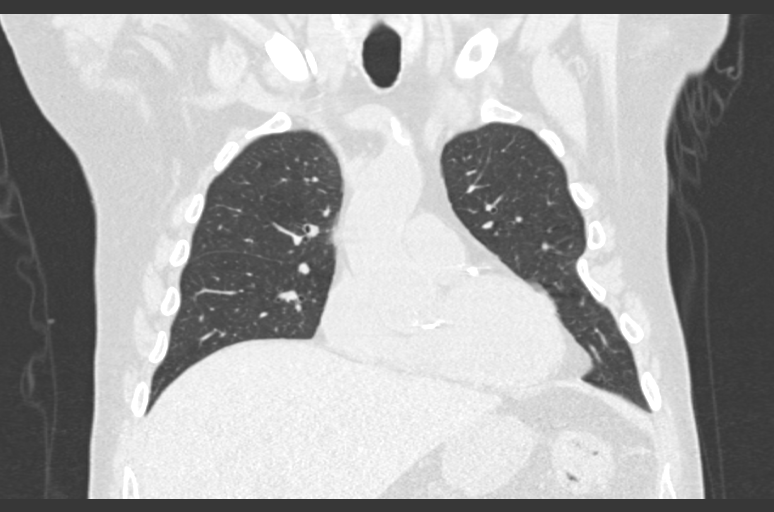
[im 105/175  lung]
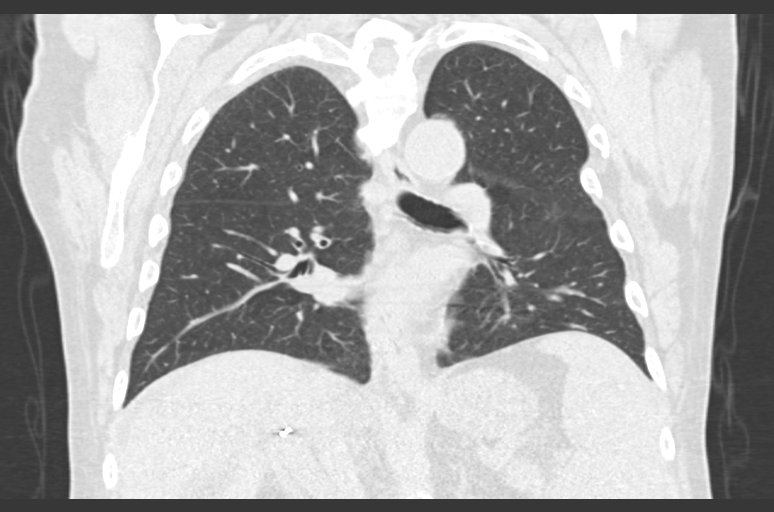

[13 of 36 positions shown; findings below may reference images not displayed]

FINDINGS: Cardiovascular: Heart size is normal. There is no significant
pericardial fluid, thickening or pericardial calcification. There is
aortic atherosclerosis, as well as atherosclerosis of the great
vessels of the mediastinum and the coronary arteries, including
calcified atherosclerotic plaque in the left main, left anterior
descending, left circumflex and right coronary arteries. Severe
calcifications of the aortic valve. Right internal jugular
single-lumen porta cath with tip terminating in the right atrium.

Mediastinum/Nodes: No pathologically enlarged mediastinal or hilar
lymph nodes. Please note that accurate exclusion of hilar adenopathy
is limited on noncontrast CT scans. Esophagus is unremarkable in
appearance. No axillary lymphadenopathy.

Lungs/Pleura: Previously noted right lower lobe pulmonary nodule
(axial images 100 3-104 of series 5) continues to enlarge, currently
measuring 1.2 x 1.0 cm, with slightly macrolobulated margins. 7 x 5
mm nodule in the superior segment of the right lower lobe (axial
image 66 of series 5), stable. 4 mm right middle lobe nodules on
axial images 79 and 97 of series 5 are both stable. 3 mm left upper
lobe nodule (axial image 64 of series 5), stable. Pleural-based
nodular area of architectural distortion in the medial left lower
lobe (axial image 94 of series 5) also stable, likely an area of
chronic scarring. No definite new suspicious appearing pulmonary
nodules or masses are noted. No acute consolidative airspace
disease. No pleural effusions.

Upper Abdomen: Please see separate dictation for contemporaneously
obtained MRI of the abdomen dated [DATE] for full description of
findings below the diaphragm.

Musculoskeletal: There are no aggressive appearing lytic or blastic
lesions noted in the visualized portions of the skeleton.
IMPRESSION: 1. Interval enlargement of macrolobulated right lower lobe pulmonary
nodule which currently measures 1.2 x 1.0 cm, concerning for a
metastatic lesion or solitary primary bronchogenic carcinoma. Other
smaller pulmonary nodules are all stable compared to the prior
examinations and strongly favored to be benign.
2. No mediastinal or hilar lymphadenopathy.
3. Aortic atherosclerosis, in addition to left main and three-vessel
coronary artery disease. Assessment for potential risk factor
modification, dietary therapy or pharmacologic therapy may be
warranted, if clinically indicated.
4. There are severe calcifications of the aortic valve.
Echocardiographic correlation for evaluation of potential valvular
dysfunction may be warranted if clinically indicated.

Aortic Atherosclerosis ([BR]-[BR]).

## 2022-04-24 MED ORDER — GADOBUTROL 1 MMOL/ML IV SOLN
10.0000 mL | Freq: Once | INTRAVENOUS | Status: AC | PRN
  Administered 2022-04-24: 10 mL via INTRAVENOUS

## 2022-04-28 ENCOUNTER — Other Ambulatory Visit: Payer: Self-pay

## 2022-04-28 DIAGNOSIS — C221 Intrahepatic bile duct carcinoma: Secondary | ICD-10-CM

## 2022-04-28 MED ORDER — TIBSOVO 250 MG PO TABS: 500.0000 mg | ORAL_TABLET | Freq: Every day | ORAL | 1 refills | Status: DC

## 2022-05-01 ENCOUNTER — Inpatient Hospital Stay: Payer: Medicare HMO | Attending: Hematology

## 2022-05-01 ENCOUNTER — Other Ambulatory Visit: Payer: Self-pay

## 2022-05-01 DIAGNOSIS — I4891 Unspecified atrial fibrillation: Secondary | ICD-10-CM | POA: Diagnosis not present

## 2022-05-01 DIAGNOSIS — F17290 Nicotine dependence, other tobacco product, uncomplicated: Secondary | ICD-10-CM | POA: Diagnosis not present

## 2022-05-01 DIAGNOSIS — I509 Heart failure, unspecified: Secondary | ICD-10-CM | POA: Diagnosis not present

## 2022-05-01 DIAGNOSIS — C221 Intrahepatic bile duct carcinoma: Secondary | ICD-10-CM | POA: Insufficient documentation

## 2022-05-01 DIAGNOSIS — I110 Hypertensive heart disease with heart failure: Secondary | ICD-10-CM | POA: Insufficient documentation

## 2022-05-01 DIAGNOSIS — Z7901 Long term (current) use of anticoagulants: Secondary | ICD-10-CM | POA: Diagnosis not present

## 2022-05-01 DIAGNOSIS — Z79899 Other long term (current) drug therapy: Secondary | ICD-10-CM | POA: Insufficient documentation

## 2022-05-01 DIAGNOSIS — Z66 Do not resuscitate: Secondary | ICD-10-CM | POA: Insufficient documentation

## 2022-05-01 DIAGNOSIS — Z95828 Presence of other vascular implants and grafts: Secondary | ICD-10-CM

## 2022-05-01 DIAGNOSIS — E1140 Type 2 diabetes mellitus with diabetic neuropathy, unspecified: Secondary | ICD-10-CM | POA: Insufficient documentation

## 2022-05-01 LAB — CMP (CANCER CENTER ONLY)
ALT: 17 U/L (ref 0–44)
AST: 20 U/L (ref 15–41)
Albumin: 3.9 g/dL (ref 3.5–5.0)
Alkaline Phosphatase: 46 U/L (ref 38–126)
Anion gap: 5 (ref 5–15)
BUN: 19 mg/dL (ref 8–23)
CO2: 25 mmol/L (ref 22–32)
Calcium: 9.7 mg/dL (ref 8.9–10.3)
Chloride: 108 mmol/L (ref 98–111)
Creatinine: 1.35 mg/dL — ABNORMAL HIGH (ref 0.61–1.24)
GFR, Estimated: 55 mL/min — ABNORMAL LOW (ref >60)
Glucose, Bld: 106 mg/dL — ABNORMAL HIGH (ref 70–99)
Potassium: 4.2 mmol/L (ref 3.5–5.1)
Sodium: 138 mmol/L (ref 135–145)
Total Bilirubin: 0.4 mg/dL (ref 0.3–1.2)
Total Protein: 7 g/dL (ref 6.5–8.1)

## 2022-05-01 LAB — CBC WITH DIFFERENTIAL (CANCER CENTER ONLY)
Abs Immature Granulocytes: 0.01 10*3/uL (ref 0.00–0.07)
Basophils Absolute: 0 10*3/uL (ref 0.0–0.1)
Basophils Relative: 1 %
Eosinophils Absolute: 0.1 10*3/uL (ref 0.0–0.5)
Eosinophils Relative: 2 %
HCT: 34.9 % — ABNORMAL LOW (ref 39.0–52.0)
Hemoglobin: 11.2 g/dL — ABNORMAL LOW (ref 13.0–17.0)
Immature Granulocytes: 0 %
Lymphocytes Relative: 19 %
Lymphs Abs: 0.8 10*3/uL (ref 0.7–4.0)
MCH: 29.2 pg (ref 26.0–34.0)
MCHC: 32.1 g/dL (ref 30.0–36.0)
MCV: 91.1 fL (ref 80.0–100.0)
Monocytes Absolute: 0.4 10*3/uL (ref 0.1–1.0)
Monocytes Relative: 9 %
Neutro Abs: 3.1 10*3/uL (ref 1.7–7.7)
Neutrophils Relative %: 69 %
Platelet Count: 106 10*3/uL — ABNORMAL LOW (ref 150–400)
RBC: 3.83 MIL/uL — ABNORMAL LOW (ref 4.22–5.81)
RDW: 15.6 % — ABNORMAL HIGH (ref 11.5–15.5)
WBC Count: 4.5 10*3/uL (ref 4.0–10.5)
nRBC: 0 % (ref 0.0–0.2)

## 2022-05-01 LAB — MAGNESIUM: Magnesium: 1.9 mg/dL (ref 1.7–2.4)

## 2022-05-01 MED ORDER — SODIUM CHLORIDE 0.9% FLUSH
10.0000 mL | Freq: Once | INTRAVENOUS | Status: AC
  Administered 2022-05-01: 10 mL

## 2022-05-01 MED ORDER — HEPARIN SOD (PORK) LOCK FLUSH 100 UNIT/ML IV SOLN
500.0000 [IU] | Freq: Once | INTRAVENOUS | Status: AC
  Administered 2022-05-01: 500 [IU]

## 2022-05-08 ENCOUNTER — Other Ambulatory Visit (HOSPITAL_COMMUNITY): Payer: Self-pay

## 2022-05-08 ENCOUNTER — Encounter: Payer: Self-pay | Admitting: Hematology

## 2022-05-08 ENCOUNTER — Telehealth: Payer: Self-pay | Admitting: Pharmacist

## 2022-05-08 ENCOUNTER — Telehealth: Payer: Self-pay | Admitting: Pharmacy Technician

## 2022-05-08 ENCOUNTER — Other Ambulatory Visit: Payer: Self-pay

## 2022-05-08 ENCOUNTER — Inpatient Hospital Stay: Payer: Medicare HMO | Admitting: Hematology

## 2022-05-08 VITALS — BP 135/63 | HR 63 | Temp 98.3°F | Resp 17 | Ht 69.0 in | Wt 214.3 lb

## 2022-05-08 DIAGNOSIS — I4891 Unspecified atrial fibrillation: Secondary | ICD-10-CM | POA: Diagnosis not present

## 2022-05-08 DIAGNOSIS — Z7901 Long term (current) use of anticoagulants: Secondary | ICD-10-CM | POA: Diagnosis not present

## 2022-05-08 DIAGNOSIS — Z66 Do not resuscitate: Secondary | ICD-10-CM | POA: Diagnosis not present

## 2022-05-08 DIAGNOSIS — I110 Hypertensive heart disease with heart failure: Secondary | ICD-10-CM | POA: Diagnosis not present

## 2022-05-08 DIAGNOSIS — C221 Intrahepatic bile duct carcinoma: Secondary | ICD-10-CM | POA: Diagnosis not present

## 2022-05-08 DIAGNOSIS — Z79899 Other long term (current) drug therapy: Secondary | ICD-10-CM | POA: Diagnosis not present

## 2022-05-08 DIAGNOSIS — E1140 Type 2 diabetes mellitus with diabetic neuropathy, unspecified: Secondary | ICD-10-CM | POA: Diagnosis not present

## 2022-05-08 DIAGNOSIS — I509 Heart failure, unspecified: Secondary | ICD-10-CM | POA: Diagnosis not present

## 2022-05-08 DIAGNOSIS — F17290 Nicotine dependence, other tobacco product, uncomplicated: Secondary | ICD-10-CM | POA: Diagnosis not present

## 2022-05-08 MED ORDER — REGORAFENIB 40 MG PO TABS
ORAL_TABLET | ORAL | 0 refills | Status: DC
  Filled 2022-05-08: qty 63, 29d supply, fill #0
  Filled 2022-05-09: qty 63, 28d supply, fill #0

## 2022-05-08 NOTE — Telephone Encounter (Signed)
Oral Oncology Patient Advocate Encounter   Received notification that prior authorization for Stivarga is required.   PA submitted on 05/08/2022 Key [id number] Status is pending     Lady Deutscher, CPhT-Adv Pharmacy Patient Advocate Specialist [place] Patient Advocate Team Direct Number: [phone]  Fax: [phone]

## 2022-05-08 NOTE — Telephone Encounter (Signed)
Oral Oncology Pharmacist Encounter  Received new prescription for Stivarga (regorafenib) for the treatment of metastatic intrahepatic cholangiocarcinoma, planned duration until disease progression or unacceptable drug toxicity.  CBC w/ Diff and CMP from 05/01/22 assessed, noted pt with pltc of 106 K/uL. Patient with Scr of 1.35 mg/dL (CrCl ~ 56.1 mL/min) - no renal dose adjustments required. Patient starting on dose titration. Prescription dose and frequency assessed for appropriateness. Patient to start after finishing current fill of Tibsovo per MD note.    Current medication list in Epic reviewed, no relevant/significant DDIs with Stivarga identified.  Evaluated chart and no patient barriers to medication adherence noted.   Patient agreement for treatment documented in MD note on 05/08/22.  Prescription has been e-scribed to the [facility] for benefits analysis and approval.  Oral Oncology Clinic will continue to follow for insurance authorization, copayment issues, initial counseling and start date.  Leron Croak, PharmD, BCPS Hematology/Oncology Clinical Pharmacist [facility] [place] [phone] 05/08/2022 10:41 AM

## 2022-05-08 NOTE — Progress Notes (Signed)
[place]   Telephone:([phone]) [phone] [phone]) [phone]   Clinic Follow up Note   Patient Care Team: Marga Hoots, NP as PCP - General (Adult Health Nurse Practitioner) Lorretta Harp, MD as PCP - Cardiology (Cardiology) [name], MD as Consulting Physician (General Surgery) [name], [name], MD as Consulting Physician (Gastroenterology) [name], MD as Consulting Physician (Hematology) Lorretta Harp, MD as Consulting Physician (Cardiology)  Date of Service:  05/08/2022  CHIEF COMPLAINT: f/u of cholangiocarcinoma  CURRENT THERAPY:  PENDING Stivarga   ASSESSMENT & PLAN:  Brandon Sherman is a 76 y.o. male with   1. Intrahepatic cholangiocarcinoma, cT2N0Mx, unresectable, with indeterminate lung nodules, IDH mutation (+) -Diagnosed in 07/2019. CT scans and MRI liver show a large 7.3cm mass in the right hepatic lobe which abuts portal vein. Both our local surgeon Dr. Barry Dienes and Dr Carlis Abbott at [facility] concluded that cancer is not resectable, and therefore not likely curable, due to the invasion to portal vein. -He received palliative systemic treatment, standard first line chemo with IV Cisplatin and Gemcitabine 2 weeks on/1 week off, 09/29/19-07/19/20. Cisplatin discontinued 03/23/20 due to Afib. -His FO results showed MSI stable disease, IDH1 mutations he may benefit from IDH inhibitor  -he progressed through single agent gemcitabine (12/24/20-03/16/21), FOLFOX plus durvalumab (04/05/21-10/21/21), FOLFIRI (11/01/21-01/19/22), and now ivosidenib  -he was switched to ivosidenib on 02/02/22. He is tolerating well with no noticeable side effects. EKG has been unremarkable, I personally reviewed. He has been feeling better since he came off chemo  -restaging abdomen MRI and chest CT on 04/24/22 showed: similar size of dominant liver mass with increased vascular involvement; enlargement of satellite liver tumor and RLL pulmonary nodule. I reviewed the results and images with  them today. -There is no standard treatment options at this point. According to NCCN guideline, Stivarga and Lenvatinib are still options based on small clinical trial data. I discussed changing treatment to [place]. I reviewed the indications and side effects with them. He agrees to proceed.  -labs from 05/01/22 reviewed, overall stable to improved. Plan to have him continue his current refill of the ivosidenib, then switch to [place] when he receives it. He will start with ramping dose-- two tablets for week one, three tablets for week two, four tablets for week three, then off for a week.   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 -mild and stable   3. Goal of care discussion  -We previously discussed the incurable nature of his cancer, and the overall poor prognosis, especially if he does not have good response to chemotherapy or progress on chemo -The patient understands the goal of care is palliative. -DNR/DNI signed 02/13/22. A copy of his living will has been scanned into his chart.   4. Afib, CHF -diagnosed after PAC placement on 02/23/20. -CHADS2 score 2, moderate risk for stroke. Started on metoprolol and eliquis on 03/01/20. Tolerating well without bleeding.  -He had cardioversion on 05/19/20 -he is now off diltiazem and has increased his metoprolol, per cardiologist Dr. Gwenlyn Found.   5. DM, HTN, HLD, Gout -On Metformin, amlodipine, lisinopril, lasix, allopurinol -Continue to f/u with his PCP  -I previously reduced lasix to 1 tab BID rather than 2 tabs BID -monitoring BP and BG   6. Nicotine Use -He never smoked but has been chewing Tobacco for the past 60 years. He no longer drinks alcohol and tells me he has quit  tobacco use (11/25/19).    7. GERD and gastritis, history of esophageal candidiasis -He had repeated EGD with Dr. Havery Moros in 07/2019. 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 May repeat EGD in future  -GERD resolved on PPI and carafate     Plan: -complete current refill of ivosidenib continue he switch to [place] -plan to start [place] with ramping dose as above when he receives it  -lab and f/u in 3 weeks    No problem-specific Assessment & Plan notes found for this encounter.   SUMMARY OF ONCOLOGIC HISTORY: Oncology History Overview Note  Cancer Staging Intrahepatic cholangiocarcinoma ([place]) Staging form: Intrahepatic Bile Duct, AJCC 8th Edition - Clinical stage from 08/19/2019: Stage II (cT2, cN0, cM0) - Signed by [name], MD on 08/19/2019    Intrahepatic cholangiocarcinoma ([place])  06/28/2019 Imaging   CT AP W Contrast 06/28/19  IMPRESSION: 1. Heterogeneous hypodensity posteriorly in the right hepatic lobe and potentially extending into the caudate lobe suspicious for a mass. There is felt to be truncation of branches of the portal vein in this vicinity and some narrowing of the hepatic vein, as well as triangular-shaped regions of abnormal hypoenhancement posteriorly in the right hepatic lobe likely representing downstream vascular effects. Cannot exclude malignancy such as cholangiocarcinoma or hepatocellular carcinoma, and follow up hepatic protocol MRI with and without contrast is recommended to further characterize. 2. 4 mm right middle lobe pulmonary nodule is likely benign but may merit surveillance. 3. Cholelithiasis. 4.  Aortic Atherosclerosis (ICD10-I70.0). 5. Prostatomegaly. 6. Mild impingement at L3-4 and L4-5.   07/31/2019 Imaging   MRI Liver 07/31/19 IMPRESSION: 1. 7.3 cm in long axis mass in the right hepatic lobe spanning into the caudate lobe, high suspicion for malignancy such as hepatocellular carcinoma or cholangiocarcinoma. Suspected effacement or occlusion of the right hepatic vein and posterior branches of the right portal vein. Two smaller tumor  nodules along the posterior periphery of the dominant mass. Tissue diagnosis is recommended. 2. No findings of pathologic adenopathy or distant metastatic spread. 3. 9 mm gallstone in the gallbladder. There is mild gallbladder wall thickening which may be from nondistention, correlate clinically in assessing for cholecystitis. 4.  Aortic Atherosclerosis (ICD10-I70.0). 5. Mild diffuse hepatic steatosis.   08/11/2019 Initial Biopsy   DIAGNOSIS: 08/11/19  A. LIVER, RIGHT, BIOPSY:  - Adenocarcinoma.   08/18/2019 Imaging   CT Chest 08/18/19  IMPRESSION: 1. Multiple pulmonary nodules largest at approximately 7 mm in the right lower lobe, nonspecific but concerning given findings in the liver. 2. No signs of definitive metastatic disease, also with mildly enlarged upper abdominal lymph nodes as discussed.   Aortic Atherosclerosis (ICD10-I70.0).   08/19/2019 Initial Diagnosis   Intrahepatic cholangiocarcinoma ([place])   08/19/2019 Cancer Staging   Staging form: Intrahepatic Bile Duct, AJCC 8th Edition - Clinical stage from 08/19/2019: Stage II (cT2, cN0, cM0) - Signed by [name], MD on 08/19/2019   09/29/2019 - 07/19/2020 Chemotherapy   Cisplatin and Gemcitabine 2 weeks on/1 week off starting 09/29/19. Cisplatin held from cycle 9 (03/23/20) due to fluid status/Afib. He is now on maintenance Gemcitabine. Held after 07/19/20 to proceed with liver target therapy.     10/09/2019 Imaging   CT AP IMPRESSION: 1. The dominant right hepatic lobe mass is minimally reduced in size compared to prior exams, currently measuring 6.2 by 5.3 cm, previously 6.2 by 5.6 cm. However, there is a new small hypodense lesion centrally in  the right hepatic lobe which is suspicious for a new small focus of tumor. Accordingly this is an overall mixed appearance. 2. Continued hypoenhancement in the liver downstream of the tumor likely attributable to narrowing or occlusion of the right hepatic vein by the tumor. By virtue of  its location the tumor wraps around the intrahepatic portion of the IVC. 3. 4 mm right middle lobe pulmonary nodule, stable compared to earliest available comparison of 07/18/2019. Surveillance of the patient's pulmonary nodules is recommended. 4. Other imaging findings of potential clinical significance: Coronary atherosclerosis. Cholelithiasis. Prominent stool throughout the colon favors constipation. Moderate prostatomegaly with heterogeneous enhancement of the prostate gland. Lumbar spondylosis and degenerative disc disease causing mild bilateral foraminal impingement at L3-4 and L4-5.   Aortic Atherosclerosis (ICD10-I70.0).   12/24/2019 Imaging   CT CAP W Contrast  IMPRESSION: 1. Right hepatic lobe mass and adjacent right hepatic lobe nodules appear grossly stable. No evidence of distant metastatic disease. 2. Continued stability of small pulmonary nodules. Recommend attention on follow-up. 3. Cholelithiasis. 4. Enlarged prostate. 5. Aortic atherosclerosis (ICD10-I70.0). Coronary artery calcification.   02/23/2020 Procedure   He had PAC placed on 02/23/20.    04/08/2020 Imaging   CT CAP  IMPRESSION: 1. Stable or minimally decreased size of a very ill-defined, hypodense and somewhat retractile appearing mass of the central right lobe of the liver abutting the inferior vena cava and right portal vein, measuring approximately 5.7 x 5.0 cm, previously 6.2 x 5.2 cm when measured similarly. Findings are consistent with stable or minimally improved cholangiocarcinoma. 2. Small hypodense nodules of the right lobe identified on prior examination are poorly appreciated on this single phase contrast examination although not grossly changed. Attention on follow-up. 3. There are new, moderate bilateral pleural effusions and associated atelectasis or consolidation as well as a new small pericardial effusion, nonspecific although generally concerning and suspicious for malignant effusions. There  is no directly visualized pleural nodularity. 4. Multiple small pulmonary nodules are stable.  No new nodules. 5. Coronary artery disease. Aortic Atherosclerosis (ICD10-I70.0).   07/06/2020 Imaging   MRI ABD IMPRESSION: 1. Interval decrease in size of the right hepatic lobe lesion in the medial aspect of the segment 7. Findings would suggest a good response to treatment with some contraction of the tumor. 2. No new hepatic lesions. No abdominal adenopathy or metastatic disease. 3. Stable mild intrahepatic biliary dilatation in the right hepatic lobe distal to the lesion. 4. Somewhat tortuous and almost beaded appearance of the hepatic and portal vein radicles. Findings could be radiation related. 5. Cholelithiasis.  CT chest wo contrast IMPRESSION: 1. Persistent/stable moderate-sized bilateral pleural effusions with overlying atelectasis. 2. Stable small right pulmonary nodules. No new or progressive findings. Recommend continued surveillance. 3. No mediastinal or hilar mass or adenopathy. 4. Stable advanced three-vessel coronary artery calcifications. 5. Cholelithiasis. Aortic Atherosclerosis (ICD10-I70.0)   08/11/2020 Procedure   Y90 on 08/11/20 and 08/26/20 with Dr Laurence Ferrari    11/30/2020 Imaging   MRI Abdomen  IMPRESSION: 1. Today's study demonstrates progression of disease with enlarging central lesion in the right lobe of the liver involving portions of segments 7 and 8, now with evidence of some tumor thrombus extension into the intrahepatic portion of the inferior vena cava. This is associated with increasing intrahepatic biliary ductal dilatation in segment 7 of the liver. 2. In addition, there is some subtle hyperenhancement of the distal common bile duct at and immediately proximally to the level of the ampulla. There is also some subtle delayed  enhancement around this region in the pancreatic head. This is of uncertain etiology and significance, but may be inflammatory  and currently is not associated with proximal common bile duct dilatation. However, close attention on follow-up imaging is recommended. 3. Cholelithiasis without evidence of acute cholecystitis at this time. 4. Aortic atherosclerosis.     12/24/2020 - 03/21/2021 Chemotherapy   Restart Gemcitabine 2 weeks on/1 week off given disease progression beginning 12/24/20. Discontinued after 03/21/21 due to disease progression in liver.     03/16/2021 Imaging   MRI abdomen  IMPRESSION: No significant change in size of irregular hypovascular mass in the medial right hepatic lobe.   New 1.1 cm hypovascular lesion with peripheral rim enhancement in liver dome, suspicious for metastatic disease.   New small right pleural effusion and mild ascites.   Cholelithiasis, without evidence of cholecystitis or biliary dilatation.   04/05/2021 -  Chemotherapy   Second-line FOLFOX q2weeks starting 04/05/21    04/05/2021 -  Chemotherapy   Immunotherapy with Durvalumab (Imfinzi) q4weeks starting 04/05/21. (in addition to second line FOLFOX)   06/23/2021 Imaging   MRI Abdomen IMPRESSION: 1. No significant change in subcapsular mass of the posterior right lobe of the liver. 2. No significant change in an additional small hyperenhancing lesion of the liver dome, which remains suspicious for a satellite lesion. 3. No new liver lesions. 4. 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 5. Cholelithiasis without evidence of acute cholecystitis. 6. Trace ascites.  CT Chest w/o contrast IMPRESSION: 1. Occasional small pulmonary nodules in the right lung are stable, for example a 6 mm nodule right lower lobe and a 3 mm nodule of the lateral segment right middle lobe. These are nonspecific and likely benign, incidental sequelae of infection or inflammation, metastatic disease not favored. Attention on follow-up. 2. Previously seen bilateral pleural effusions are resolved. 3. Hypodense lesion of the  posterior right lobe of the liver, keeping with known cholangiocarcinoma and better characterized by same day MR. 4. Coronary artery disease.   10/21/2021 Imaging   EXAM: MRI ABDOMEN WITHOUT AND WITH CONTRAST  IMPRESSION: 1. Mild progression of dominant right hepatic lobe mass with involvement of the right portal, hepatic veins and minimal extension into the IVC. 2. Hepatic dome satellite lesion, new or more conspicuous today. 3. Cholelithiasis 4. Decrease in trace perihepatic and perisplenic ascites. 5.  Aortic Atherosclerosis (ICD10-I70.0).   10/21/2021 Imaging   EXAM: CT CHEST WITHOUT CONTRAST  IMPRESSION: 1. Since 06/23/2021, enlargement of a right lower lobe and possible enlargement of a right middle lobe pulmonary nodule. Findings are suspicious for metastatic disease. 2. No thoracic adenopathy. 3. Coronary artery atherosclerosis. Aortic Atherosclerosis (ICD10-I70.0).   11/01/2021 - 01/11/2022 Chemotherapy   Patient is on Treatment Plan : PANCREAS FOLFIRI q14d     01/19/2022 Imaging   EXAM: CT CHEST WITHOUT CONTRAST  IMPRESSION: 1. Continued increase in size of lung nodule within the posterior right lung base which now measures 1 cm and is suspicious for metastatic disease. The remaining lung nodules are stable from 10/21/2021. 2. No thoracic adenopathy. 3. Aortic Atherosclerosis (ICD10-I70.0). Coronary artery calcifications.   01/19/2022 Imaging   EXAM: MRI ABDOMEN WITHOUT AND WITH CONTRAST  IMPRESSION: 1. No significant change in size or appearance of the large heterogeneous enhancing mass area in the right hepatic lobe as described. There is mild interval enlargement of the adjacent satellite lesion near the dome of the right lobe now measuring 1.9 cm. 2. Redemonstration of a pulmonary nodule in the right lower lobe  measuring 9 mm. 3. Small volume ascites. 4. Splenomegaly. 5. Cholelithiasis.      INTERVAL HISTORY:  Brandon Sherman is here for a  follow up of cholangiocarcinoma. He was last seen by me on 03/14/22. He presents to the clinic accompanied by his wife. He reports he is doing very well overall, feeling much better off chemo. He notes he is tolerating the ivosidenib well. He notes occasional diarrhea that is not bothersome or persistent.   All other systems were reviewed with the patient and are negative.  MEDICAL HISTORY:  Past Medical History:  Diagnosis Date   Arthritis    Diabetes ([place])    GERD (gastroesophageal reflux disease)    Hyperlipidemia    Hypertension    Intrahepatic cholangiocarcinoma ([place])     SURGICAL HISTORY: Past Surgical History:  Procedure Laterality Date   APPENDECTOMY  1980   CARDIOVERSION N/A 04/27/2020   Procedure: CARDIOVERSION;  Surgeon: Dorothy Spark, MD;  Location: [facility];  Service: Cardiovascular;  Laterality: N/A;   CARDIOVERSION N/A 05/19/2020   Procedure: CARDIOVERSION;  Surgeon: Elouise Munroe, MD;  Location: [facility];  Service: Cardiovascular;  Laterality: N/A;   COLONOSCOPY     IR 3D INDEPENDENT WKST  08/11/2020   IR 3D INDEPENDENT WKST  08/11/2020   IR ANGIOGRAM SELECTIVE EACH ADDITIONAL VESSEL  08/11/2020   IR ANGIOGRAM SELECTIVE EACH ADDITIONAL VESSEL  08/11/2020   IR ANGIOGRAM SELECTIVE EACH ADDITIONAL VESSEL  08/11/2020   IR ANGIOGRAM SELECTIVE EACH ADDITIONAL VESSEL  08/11/2020   IR ANGIOGRAM SELECTIVE EACH ADDITIONAL VESSEL  08/11/2020   IR ANGIOGRAM SELECTIVE EACH ADDITIONAL VESSEL  08/11/2020   IR ANGIOGRAM SELECTIVE EACH ADDITIONAL VESSEL  08/26/2020   IR ANGIOGRAM SELECTIVE EACH ADDITIONAL VESSEL  08/26/2020   IR ANGIOGRAM VISCERAL SELECTIVE  08/11/2020   IR ANGIOGRAM VISCERAL SELECTIVE  08/26/2020   IR EMBO ARTERIAL NOT HEMORR HEMANG INC GUIDE ROADMAPPING  08/11/2020   IR EMBO TUMOR ORGAN ISCHEMIA INFARCT INC GUIDE ROADMAPPING  08/26/2020   IR IMAGING GUIDED PORT INSERTION  02/23/2020   IR RADIOLOGIST EVAL & MGMT  07/14/2020   IR RADIOLOGIST EVAL & MGMT   09/30/2020   IR RADIOLOGIST EVAL & MGMT  12/01/2020   IR US GUIDE VASC ACCESS LEFT  08/26/2020   IR US GUIDE VASC ACCESS RIGHT  08/11/2020    I have reviewed the social history and family history with the patient and they are unchanged from previous note.  ALLERGIES:  has No Known Allergies.  MEDICATIONS:  Current Outpatient Medications  Medication Sig Dispense Refill   regorafenib (STIVARGA) 40 MG tablet Take 2 tabs daily for first week, then increase to 3 tabs daily for second week and 4 tabs daily for third week, if tolerates well. Take one week off before next cycle. Take with low fat meal. Caution: Chemotherapy. 63 tablet 0   allopurinol (ZYLOPRIM) 100 MG tablet Take 100 mg by mouth daily.     colchicine 0.6 MG tablet Take 1 tablet (0.6 mg total) by mouth daily as needed (gout flare). Take daily for 3 days, then as needed for gout flare 30 tablet 0   ELIQUIS 5 MG TABS tablet TAKE 1 TABLET TWICE DAILY 180 tablet 1   fenofibrate (TRICOR) 145 MG tablet Take 145 mg by mouth daily.     finasteride (PROSCAR) 5 MG tablet Take 5 mg by mouth daily.     furosemide (LASIX) 40 MG tablet TAKE 2 TABLETS (80 MG TOTAL) BY MOUTH 2 (TWO) TIMES  DAILY. 360 tablet 3   gabapentin (NEURONTIN) 100 MG capsule Take 100 mg by mouth at bedtime.     hydrALAZINE (APRESOLINE) 50 MG tablet Take 1 tablet (50 mg total) by mouth 2 (two) times daily. 180 tablet 2   KLOR-CON M20 20 MEQ tablet TAKE 1 TABLET BY MOUTH TWICE A DAY 180 tablet 3   lidocaine-prilocaine (EMLA) cream Apply 1 application topically as needed. 30 g 3   magnesium oxide (MAG-OX) 400 (240 Mg) MG tablet TAKE 1 TABLET BY MOUTH EVERY DAY 30 tablet 1   metFORMIN (GLUCOPHAGE-XR) 500 MG 24 hr tablet      metoprolol succinate (TOPROL-XL) 25 MG 24 hr tablet TAKE 1 TABLET (25 MG TOTAL) BY MOUTH DAILY. 90 tablet 1   metoprolol succinate (TOPROL-XL) 50 MG 24 hr tablet Take 1 tablet (50 mg total) by mouth daily. Take with or immediately following a meal. 90 tablet 3    metoprolol tartrate (LOPRESSOR) 50 MG tablet      pantoprazole (PROTONIX) 40 MG tablet TAKE 1 TABLET BY MOUTH TWICE A DAY 180 tablet 2   pravastatin (PRAVACHOL) 40 MG tablet Take 40 mg by mouth daily.     sucralfate (CARAFATE) 1 g tablet Take 1 tablet (1 g total) by mouth every 6 (six) hours as needed. Please schedule a yearly follow up for further refills: [phone] (Patient not taking: Reported on 10/10/2021) 60 tablet 0   tamsulosin (FLOMAX) 0.4 MG CAPS capsule Take 0.4 mg by mouth daily.     No current facility-administered medications for this visit.    PHYSICAL EXAMINATION: ECOG PERFORMANCE STATUS: 1 - Symptomatic but completely ambulatory  Vitals:   05/08/22 0928  BP: 135/63  Pulse: 63  Resp: 17  Temp: 98.3 F (36.8 C)  SpO2: 99%   Wt Readings from Last 3 Encounters:  05/08/22 214 lb 4.8 oz (97.2 kg)  03/14/22 214 lb 11.2 oz (97.4 kg)  02/13/22 218 lb 3 oz (99 kg)     GENERAL:alert, no distress and comfortable SKIN: skin color normal, no rashes or significant lesions EYES: normal, Conjunctiva are pink and non-injected, sclera clear  NEURO: alert & oriented x 3 with fluent speech  LABORATORY DATA:  I have reviewed the data as listed    Latest Ref Rng & Units 05/01/2022    9:06 AM 03/14/2022   10:58 AM 02/13/2022   12:34 PM  CBC  WBC 4.0 - 10.5 K/uL 4.5  3.7  4.4   Hemoglobin 13.0 - 17.0 g/dL 11.2  10.4  10.3   Hematocrit 39.0 - 52.0 % 34.9  32.7  32.0   Platelets 150 - 400 K/uL 106  115  96         Latest Ref Rng & Units 05/01/2022    9:06 AM 03/14/2022   10:58 AM 02/13/2022   12:34 PM  CMP  Glucose 70 - 99 mg/dL 106  164  128   BUN 8 - 23 mg/dL 19  20  19    Creatinine 0.61 - 1.24 mg/dL 1.35  1.33  1.30   Sodium 135 - 145 mmol/L 138  138  139   Potassium 3.5 - 5.1 mmol/L 4.2  3.6  4.2   Chloride 98 - 111 mmol/L 108  107  110   CO2 22 - 32 mmol/L 25  26  24    Calcium 8.9 - 10.3 mg/dL 9.7  9.2  9.2   Total Protein 6.5 - 8.1 g/dL 7.0  6.8  6.6   Total  Bilirubin 0.3 - 1.2 mg/dL 0.4  0.4  0.4   Alkaline Phos 38 - 126 U/L 46  62  68   AST 15 - 41 U/L 20  23  25    ALT 0 - 44 U/L 17  18  21        RADIOGRAPHIC STUDIES: I have personally reviewed the radiological images as listed and agreed with the findings in the report. No results found.    No orders of the defined types were placed in this encounter.  All questions were answered. The patient knows to call the clinic with any problems, questions or concerns. No barriers to learning was detected. The total time spent in the appointment was 30 minutes.     [name], MD 05/08/2022   I, Wilburn Mylar, am acting as scribe for [name], MD.   I have reviewed the above documentation for accuracy and completeness, and I agree with the above.

## 2022-05-08 NOTE — Telephone Encounter (Signed)
Oral Oncology Patient Advocate Encounter  Prior Authorization for [name] has been approved.    PA# [id number] Effective dates: 05/08/2022 through 11/19/2022  Patients co-pay is $867.84.    Lady Deutscher, CPhT-Adv Pharmacy Patient Advocate Specialist [facility] Patient Advocate Team Direct Number: [phone]  Fax: [phone]

## 2022-05-08 NOTE — Telephone Encounter (Signed)
Oral Oncology Patient Advocate Encounter   Patient has an $8,000 grant from [employer] to provide copayment coverage for [place].  This will keep the out of pocket expense at $0.    The billing information is on file at [facility].   Dates of Eligibility: 02/03/2022 through 02/04/2023  Fund name:  [email or web address].   Lady Deutscher, CPhT-Adv Pharmacy Patient Advocate Specialist [place] Patient Advocate Team Direct Number: [phone]  Fax: [phone]

## 2022-05-09 ENCOUNTER — Encounter: Payer: Self-pay | Admitting: Hematology

## 2022-05-09 ENCOUNTER — Other Ambulatory Visit (HOSPITAL_COMMUNITY): Payer: Self-pay

## 2022-05-09 NOTE — Telephone Encounter (Signed)
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  Counseled patient on administration, dosing, side effects, monitoring, drug-food interactions, safe handling, storage, and disposal.  Patient's Stivarga dose will be initiated on a dose up-titration schedule.  For week 1: Patient will take Stivarga '40mg'$  tablets, 2 tablets ('80mg'$ ) by mouth once daily with a glass of water and a low fat meal of <600 calories, and <30% fat content.  If tolerated, for week 2: Patient will take Stivarga '40mg'$  tablets, 3 tablets ('120mg'$ ) by mouth once daily with a glass of water and a low fat meal of <600 calories, and <30% fat content.  If tolerated, for week 3: Patient will take Stivarga '40mg'$  tablets, 4 tablets ('160mg'$ ) by mouth once daily with a glass of water and a low fat meal of <600 calories, and <30% fat content.  Patient will be off Stivarga for week 4.  Dosing for subsequent cycles (3 weeks on, 1 week off, repeated every 4 weeks) will be determined based on toleration of cycle 1.  Discussed in depth patient's meals with he and his wife. His heaviest meal of the day is typically breakfast, so he will be taking his Stivarga daily with his lunch which typically consist of a sandwich. We discussed using lower-fat meat products for his sandwich.   Patient knows to avoid grapefruit and grapefruit juice while on therapy with Stivarga.  Stivarga start date: 05/15/22 - last dose of tibsovo 05/13/22  Adverse effects include but are not limited to: hand-foot syndrome, diarrhea, nausea, abdominal pain, musculoskeletal pain, fatigue, hypertension, delayed wound healing, decreased blood counts, and hepatotoxicity.    Patient will obtain anti diarrheal and alert the office of 4 or more loose stools above  baseline.  Reviewed with patient importance of keeping a medication schedule and plan for any missed doses. No barriers to medication adherence identified.  Stivarga should be discontinued 2 weeks prior to any planned surgery and resumed postsurgery based on clinical judgement of wound healing.  Medication reconciliation performed and medication/allergy list updated.  Insurance authorization for [name] has been obtained. Test claim at the pharmacy revealed copayment $0 for 1st fill of Stivarga with use of grant funding. This will ship from the [facility] on 05/09/22 to deliver to patient's home on 05/10/22.  Patient informed the pharmacy will reach out 5-7 days prior to needing next fill of Stivarga to coordinate continued medication acquisition to prevent break in therapy.  All questions answered.  [name] voiced understanding and appreciation.   Medication education handout and medication calendar placed in mail for patient. Patient knows to call the office with questions or concerns. [facility] phone number provided to patient.   Leron Croak, PharmD, BCPS Hematology/Oncology Clinical Pharmacist [facility] [place] [phone] 05/09/2022 10:42 AM

## 2022-05-10 ENCOUNTER — Telehealth: Payer: Self-pay | Admitting: Hematology

## 2022-05-10 NOTE — Telephone Encounter (Signed)
Scheduled follow-up appointment per 6/19 los. Patient's wife is aware.

## 2022-05-19 ENCOUNTER — Telehealth: Payer: Self-pay

## 2022-05-19 ENCOUNTER — Other Ambulatory Visit: Payer: Self-pay

## 2022-05-19 NOTE — Telephone Encounter (Signed)
Spoke with pt and spouse via telephone regarding pt's recent constipation.  Pt's wife stated the pt has been constipated x4 days since he started the [place].  Pt's wife stated the pt normally has BMs daily.  Pt is c/o of abdominal pain and has decreased his eating d/t the pain.  Pt denied taking anything for constipation d/t pt was afraid of drug interactions with the [place].  Pt's wife stated the pt has been drinking plenty of fluids and remaining active around the house.  Instructed pt and wife to have pt take 2 Senna tabs, 1 Colace, and 8oz of Miralax 3 times per day for 2 days.  If the pt's bowels still have not moved after 48 to 72 hrs, stop this bowel regimen and take Mg+ Citrate as directed on the package bottle.  Instructed pt and wife that once the bowels begin to flow, stop the bowel regimen medications and increase the pt's water, Gatorade, Liquid IV, or Pedialyte intake to prevent dehydration.  Pt denied n/v.  Instructed pt and wife to contact Dr. Ernestina Penna office if these methods do not work.  Pt and spouse verbalized understanding of instructions.

## 2022-05-21 ENCOUNTER — Other Ambulatory Visit: Payer: Self-pay | Admitting: Hematology

## 2022-05-22 ENCOUNTER — Telehealth: Payer: Self-pay

## 2022-05-22 NOTE — Telephone Encounter (Signed)
Spoke with pt's wife regarding pt's constipation and Stivarga.  Pt's wife stated that the initial bowel regimen given with Miralax did not work; therefore, they did the 2nd recommendation of taking Mg+ Citrate which did work.  Pt is now having daily bowel movements and denies diarrhea.  Pt's abdominal pain has not resolved.  Pt started next cycle of Stivarga today where he's taking 3 pills daily.  Pt's wife stated that the pt says the pills is what is causing his stomach/liver pain.  Pt stated that the pain started when he started taking Stivarga (Cycle 1).  Asked pt and spouse if the pt was taking the Stivarga with food.  Both pt and spouse said "Yes".  Informed both pt and spouse that this RN will notify Dr. Burr Medico of the pt's complaint.

## 2022-05-26 ENCOUNTER — Other Ambulatory Visit: Payer: Self-pay | Admitting: Hematology

## 2022-05-26 ENCOUNTER — Other Ambulatory Visit: Payer: Self-pay

## 2022-05-26 DIAGNOSIS — C221 Intrahepatic bile duct carcinoma: Secondary | ICD-10-CM

## 2022-05-28 NOTE — Progress Notes (Unsigned)
[place]   [phone] [phone]) [phone]   Clinic Follow up Note   Patient Care Team: Marga Hoots, NP as PCP - General (Adult Health Nurse Practitioner) Lorretta Harp, MD as PCP - Cardiology (Cardiology) [name], MD as Consulting Physician (General Surgery) [name], [name], MD as Consulting Physician (Gastroenterology) [name], MD as Consulting Physician (Hematology) Lorretta Harp, MD as Consulting Physician (Cardiology)  Date of Service:  05/28/2022  CHIEF COMPLAINT: f/u of cholangiocarcinoma  CURRENT THERAPY:  PENDING Stivarga   ASSESSMENT & PLAN:  Brandon Sherman is a 76 y.o. male with   1. Intrahepatic cholangiocarcinoma, cT2N0Mx, unresectable, with indeterminate lung nodules, IDH mutation (+) -Diagnosed in 07/2019. CT scans and MRI liver show a large 7.3cm mass in the right hepatic lobe which abuts portal vein. Both our local surgeon Dr. Barry Dienes and Dr Carlis Abbott at [facility] concluded that cancer is not resectable, and therefore not likely curable, due to the invasion to portal vein. -He received palliative systemic treatment, standard first line chemo with IV Cisplatin and Gemcitabine 2 weeks on/1 week off, 09/29/19-07/19/20. Cisplatin discontinued 03/23/20 due to Afib. -His FO results showed MSI stable disease, IDH1 mutations he may benefit from IDH inhibitor  -he progressed through single agent gemcitabine (12/24/20-03/16/21), FOLFOX plus durvalumab (04/05/21-10/21/21), FOLFIRI (11/01/21-01/19/22), and now ivosidenib  -he was switched to ivosidenib on 02/02/22. He is tolerating well with no noticeable side effects. EKG has been unremarkable, I personally reviewed. He has been feeling better since he came off chemo  -restaging abdomen MRI and chest CT on 04/24/22 showed: similar size of dominant liver mass with increased vascular involvement; enlargement of satellite liver tumor and RLL pulmonary nodule. I reviewed the results and images with  them today. -There is no standard treatment options at this point. 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 Goal of care discussion, DNR -The patient understands the goal of care is palliative. -DNR/DNI signed 02/13/22. A copy of his living will has been scanned into his chart.   4. Afib, CHF -diagnosed after PAC placement on 02/23/20. -CHADS2 score 2, moderate risk for stroke. Started on metoprolol and eliquis on 03/01/20. Tolerating well without bleeding.  -He had cardioversion on 05/19/20 -f/u with cardiologist Dr. Gwenlyn Found.   5. DM, HTN, HLD, Gout -On Metformin, amlodipine, lisinopril, lasix, allopurinol -Continue to f/u with his PCP     Plan:    No problem-specific Assessment & Plan notes found for this encounter.   SUMMARY OF ONCOLOGIC HISTORY: Oncology History Overview Note  Cancer Staging Intrahepatic cholangiocarcinoma ([place]) Staging form: Intrahepatic Bile Duct, AJCC 8th Edition - Clinical stage from 08/19/2019: Stage II (cT2, cN0, cM0) - Signed by [name], MD on 08/19/2019    Intrahepatic cholangiocarcinoma ([place])  06/28/2019 Imaging   CT AP W Contrast 06/28/19  IMPRESSION: 1. Heterogeneous hypodensity posteriorly in the right hepatic lobe and potentially extending into the caudate lobe suspicious for a mass. There is felt to be truncation of branches of the portal vein in this vicinity and some narrowing of the hepatic vein, as well as triangular-shaped regions of abnormal hypoenhancement posteriorly in the right hepatic lobe likely representing downstream vascular effects. Cannot exclude malignancy such as cholangiocarcinoma or hepatocellular carcinoma, and follow up hepatic protocol MRI with and without contrast is recommended to further characterize. 2. 4 mm right middle lobe pulmonary nodule is likely benign but may merit  surveillance. 3. Cholelithiasis. 4.  Aortic Atherosclerosis  (ICD10-I70.0). 5. Prostatomegaly. 6. Mild impingement at L3-4 and L4-5.   07/31/2019 Imaging   MRI Liver 07/31/19 IMPRESSION: 1. 7.3 cm in long axis mass in the right hepatic lobe spanning into the caudate lobe, high suspicion for malignancy such as hepatocellular carcinoma or cholangiocarcinoma. Suspected effacement or occlusion of the right hepatic vein and posterior branches of the right portal vein. Two smaller tumor nodules along the posterior periphery of the dominant mass. Tissue diagnosis is recommended. 2. No findings of pathologic adenopathy or distant metastatic spread. 3. 9 mm gallstone in the gallbladder. There is mild gallbladder wall thickening which may be from nondistention, correlate clinically in assessing for cholecystitis. 4.  Aortic Atherosclerosis (ICD10-I70.0). 5. Mild diffuse hepatic steatosis.   08/11/2019 Initial Biopsy   DIAGNOSIS: 08/11/19  A. LIVER, RIGHT, BIOPSY:  - Adenocarcinoma.   08/18/2019 Imaging   CT Chest 08/18/19  IMPRESSION: 1. Multiple pulmonary nodules largest at approximately 7 mm in the right lower lobe, nonspecific but concerning given findings in the liver. 2. No signs of definitive metastatic disease, also with mildly enlarged upper abdominal lymph nodes as discussed.   Aortic Atherosclerosis (ICD10-I70.0).   08/19/2019 Initial Diagnosis   Intrahepatic cholangiocarcinoma ([place])   08/19/2019 Cancer Staging   Staging form: Intrahepatic Bile Duct, AJCC 8th Edition - Clinical stage from 08/19/2019: Stage II (cT2, cN0, cM0) - Signed by [name], MD on 08/19/2019   09/29/2019 - 07/19/2020 Chemotherapy   Cisplatin and Gemcitabine 2 weeks on/1 week off starting 09/29/19. Cisplatin held from cycle 9 (03/23/20) due to fluid status/Afib. He is now on maintenance Gemcitabine. Held after 07/19/20 to proceed with liver target therapy.     10/09/2019 Imaging   CT AP IMPRESSION: 1. The dominant right hepatic lobe mass is minimally reduced in size  compared to prior exams, currently measuring 6.2 by 5.3 cm, previously 6.2 by 5.6 cm. However, there is a new small hypodense lesion centrally in the right hepatic lobe which is suspicious for a new small focus of tumor. Accordingly this is an overall mixed appearance. 2. Continued hypoenhancement in the liver downstream of the tumor likely attributable to narrowing or occlusion of the right hepatic vein by the tumor. By virtue of its location the tumor wraps around the intrahepatic portion of the IVC. 3. 4 mm right middle lobe pulmonary nodule, stable compared to earliest available comparison of 07/18/2019. Surveillance of the patient's pulmonary nodules is recommended. 4. Other imaging findings of potential clinical significance: Coronary atherosclerosis. Cholelithiasis. Prominent stool throughout the colon favors constipation. Moderate prostatomegaly with heterogeneous enhancement of the prostate gland. Lumbar spondylosis and degenerative disc disease causing mild bilateral foraminal impingement at L3-4 and L4-5.   Aortic Atherosclerosis (ICD10-I70.0).   12/24/2019 Imaging   CT CAP W Contrast  IMPRESSION: 1. Right hepatic lobe mass and adjacent right hepatic lobe nodules appear grossly stable. No evidence of distant metastatic disease. 2. Continued stability of small pulmonary nodules. Recommend attention on follow-up. 3. Cholelithiasis. 4. Enlarged prostate. 5. Aortic atherosclerosis (ICD10-I70.0). Coronary artery calcification.   02/23/2020 Procedure   He had PAC placed on 02/23/20.    04/08/2020 Imaging   CT CAP  IMPRESSION: 1. Stable or minimally decreased size of a very ill-defined, hypodense and somewhat retractile appearing mass of the central right lobe of the liver abutting the inferior vena cava and right portal vein, measuring approximately 5.7 x 5.0 cm, previously 6.2 x 5.2 cm when measured similarly. Findings are consistent with stable  or minimally improved  cholangiocarcinoma. 2. Small hypodense nodules of the right lobe identified on prior examination are poorly appreciated on this single phase contrast examination although not grossly changed. Attention on follow-up. 3. There are new, moderate bilateral pleural effusions and associated atelectasis or consolidation as well as a new small pericardial effusion, nonspecific although generally concerning and suspicious for malignant effusions. There is no directly visualized pleural nodularity. 4. Multiple small pulmonary nodules are stable.  No new nodules. 5. Coronary artery disease. Aortic Atherosclerosis (ICD10-I70.0).   07/06/2020 Imaging   MRI ABD IMPRESSION: 1. Interval decrease in size of the right hepatic lobe lesion in the medial aspect of the segment 7. Findings would suggest a good response to treatment with some contraction of the tumor. 2. No new hepatic lesions. No abdominal adenopathy or metastatic disease. 3. Stable mild intrahepatic biliary dilatation in the right hepatic lobe distal to the lesion. 4. Somewhat tortuous and almost beaded appearance of the hepatic and portal vein radicles. Findings could be radiation related. 5. Cholelithiasis.  CT chest wo contrast IMPRESSION: 1. Persistent/stable moderate-sized bilateral pleural effusions with overlying atelectasis. 2. Stable small right pulmonary nodules. No new or progressive findings. Recommend continued surveillance. 3. No mediastinal or hilar mass or adenopathy. 4. Stable advanced three-vessel coronary artery calcifications. 5. Cholelithiasis. Aortic Atherosclerosis (ICD10-I70.0)   08/11/2020 Procedure   Y90 on 08/11/20 and 08/26/20 with Dr Laurence Ferrari    11/30/2020 Imaging   MRI Abdomen  IMPRESSION: 1. Today's study demonstrates progression of disease with enlarging central lesion in the right lobe of the liver involving portions of segments 7 and 8, now with evidence of some tumor thrombus extension into the  intrahepatic portion of the inferior vena cava. This is associated with increasing intrahepatic biliary ductal dilatation in segment 7 of the liver. 2. In addition, there is some subtle hyperenhancement of the distal common bile duct at and immediately proximally to the level of the ampulla. There is also some subtle delayed enhancement around this region in the pancreatic head. This is of uncertain etiology and significance, but may be inflammatory and currently is not associated with proximal common bile duct dilatation. However, close attention on follow-up imaging is recommended. 3. Cholelithiasis without evidence of acute cholecystitis at this time. 4. Aortic atherosclerosis.     12/24/2020 - 03/21/2021 Chemotherapy   Restart Gemcitabine 2 weeks on/1 week off given disease progression beginning 12/24/20. Discontinued after 03/21/21 due to disease progression in liver.     03/16/2021 Imaging   MRI abdomen  IMPRESSION: No significant change in size of irregular hypovascular mass in the medial right hepatic lobe.   New 1.1 cm hypovascular lesion with peripheral rim enhancement in liver dome, suspicious for metastatic disease.   New small right pleural effusion and mild ascites.   Cholelithiasis, without evidence of cholecystitis or biliary dilatation.   04/05/2021 -  Chemotherapy   Second-line FOLFOX q2weeks starting 04/05/21    04/05/2021 -  Chemotherapy   Immunotherapy with Durvalumab (Imfinzi) q4weeks starting 04/05/21. (in addition to second line FOLFOX)   06/23/2021 Imaging   MRI Abdomen IMPRESSION: 1. No significant change in subcapsular mass of the posterior right lobe of the liver. 2. No significant change in an additional small hyperenhancing lesion of the liver dome, which remains suspicious for a satellite lesion. 3. No new liver lesions. 4. 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 5. Cholelithiasis without evidence of acute cholecystitis. 6. Trace  ascites.  CT Chest w/o contrast IMPRESSION: 1. Occasional small pulmonary  nodules in the right lung are stable, for example a 6 mm nodule right lower lobe and a 3 mm nodule of the lateral segment right middle lobe. These are nonspecific and likely benign, incidental sequelae of infection or inflammation, metastatic disease not favored. Attention on follow-up. 2. Previously seen bilateral pleural effusions are resolved. 3. Hypodense lesion of the posterior right lobe of the liver, keeping with known cholangiocarcinoma and better characterized by same day MR. 4. Coronary artery disease.   10/21/2021 Imaging   EXAM: MRI ABDOMEN WITHOUT AND WITH CONTRAST  IMPRESSION: 1. Mild progression of dominant right hepatic lobe mass with involvement of the right portal, hepatic veins and minimal extension into the IVC. 2. Hepatic dome satellite lesion, new or more conspicuous today. 3. Cholelithiasis 4. Decrease in trace perihepatic and perisplenic ascites. 5.  Aortic Atherosclerosis (ICD10-I70.0).   10/21/2021 Imaging   EXAM: CT CHEST WITHOUT CONTRAST  IMPRESSION: 1. Since 06/23/2021, enlargement of a right lower lobe and possible enlargement of a right middle lobe pulmonary nodule. Findings are suspicious for metastatic disease. 2. No thoracic adenopathy. 3. Coronary artery atherosclerosis. Aortic Atherosclerosis (ICD10-I70.0).   11/01/2021 - 01/11/2022 Chemotherapy   Patient is on Treatment Plan : PANCREAS FOLFIRI q14d     01/19/2022 Imaging   EXAM: CT CHEST WITHOUT CONTRAST  IMPRESSION: 1. Continued increase in size of lung nodule within the posterior right lung base which now measures 1 cm and is suspicious for metastatic disease. The remaining lung nodules are stable from 10/21/2021. 2. No thoracic adenopathy. 3. Aortic Atherosclerosis (ICD10-I70.0). Coronary artery calcifications.   01/19/2022 Imaging   EXAM: MRI ABDOMEN WITHOUT AND WITH CONTRAST  IMPRESSION: 1. No significant  change in size or appearance of the large heterogeneous enhancing mass area in the right hepatic lobe as described. There is mild interval enlargement of the adjacent satellite lesion near the dome of the right lobe now measuring 1.9 cm. 2. Redemonstration of a pulmonary nodule in the right lower lobe measuring 9 mm. 3. Small volume ascites. 4. Splenomegaly. 5. Cholelithiasis.      INTERVAL HISTORY:  Brandon Sherman is here for a follow up of cholangiocarcinoma. He was last seen by me on 03/14/22. He presents to the clinic accompanied by his wife. He reports he is doing very well overall, feeling much better off chemo. He notes he is tolerating the ivosidenib well. He notes occasional diarrhea that is not bothersome or persistent.   All other systems were reviewed with the patient and are negative.  MEDICAL HISTORY:  Past Medical History:  Diagnosis Date   Arthritis    Diabetes ([place])    GERD (gastroesophageal reflux disease)    Hyperlipidemia    Hypertension    Intrahepatic cholangiocarcinoma ([place])     SURGICAL HISTORY: Past Surgical History:  Procedure Laterality Date   APPENDECTOMY  1980   CARDIOVERSION N/A 04/27/2020   Procedure: CARDIOVERSION;  Surgeon: Dorothy Spark, MD;  Location: [place];  Service: Cardiovascular;  Laterality: N/A;   CARDIOVERSION N/A 05/19/2020   Procedure: CARDIOVERSION;  Surgeon: Elouise Munroe, MD;  Location: [place];  Service: Cardiovascular;  Laterality: N/A;   COLONOSCOPY     IR 3D INDEPENDENT WKST  08/11/2020   IR 3D INDEPENDENT WKST  08/11/2020   IR ANGIOGRAM SELECTIVE EACH ADDITIONAL VESSEL  08/11/2020   IR ANGIOGRAM SELECTIVE EACH ADDITIONAL VESSEL  08/11/2020   IR ANGIOGRAM SELECTIVE EACH ADDITIONAL VESSEL  08/11/2020   IR ANGIOGRAM SELECTIVE EACH ADDITIONAL VESSEL  08/11/2020  IR ANGIOGRAM SELECTIVE EACH ADDITIONAL VESSEL  08/11/2020   IR ANGIOGRAM SELECTIVE EACH ADDITIONAL VESSEL  08/11/2020   IR ANGIOGRAM SELECTIVE EACH  ADDITIONAL VESSEL  08/26/2020   IR ANGIOGRAM SELECTIVE EACH ADDITIONAL VESSEL  08/26/2020   IR ANGIOGRAM VISCERAL SELECTIVE  08/11/2020   IR ANGIOGRAM VISCERAL SELECTIVE  08/26/2020   IR EMBO ARTERIAL NOT HEMORR HEMANG INC GUIDE ROADMAPPING  08/11/2020   IR EMBO TUMOR ORGAN ISCHEMIA INFARCT INC GUIDE ROADMAPPING  08/26/2020   IR IMAGING GUIDED PORT INSERTION  02/23/2020   IR RADIOLOGIST EVAL & MGMT  07/14/2020   IR RADIOLOGIST EVAL & MGMT  09/30/2020   IR RADIOLOGIST EVAL & MGMT  12/01/2020   IR US GUIDE VASC ACCESS LEFT  08/26/2020   IR US GUIDE VASC ACCESS RIGHT  08/11/2020    I have reviewed the social history and family history with the patient and they are unchanged from previous note.  ALLERGIES:  has No Known Allergies.  MEDICATIONS:  Current Outpatient Medications  Medication Sig Dispense Refill   allopurinol (ZYLOPRIM) 100 MG tablet Take 100 mg by mouth daily.     colchicine 0.6 MG tablet Take 1 tablet (0.6 mg total) by mouth daily as needed (gout flare). Take daily for 3 days, then as needed for gout flare 30 tablet 0   ELIQUIS 5 MG TABS tablet TAKE 1 TABLET TWICE DAILY 180 tablet 1   fenofibrate (TRICOR) 145 MG tablet Take 145 mg by mouth daily.     finasteride (PROSCAR) 5 MG tablet Take 5 mg by mouth daily.     furosemide (LASIX) 40 MG tablet TAKE 2 TABLETS (80 MG TOTAL) BY MOUTH 2 (TWO) TIMES DAILY. 360 tablet 3   gabapentin (NEURONTIN) 100 MG capsule Take 100 mg by mouth at bedtime.     hydrALAZINE (APRESOLINE) 50 MG tablet Take 1 tablet (50 mg total) by mouth 2 (two) times daily. 180 tablet 2   KLOR-CON M20 20 MEQ tablet TAKE 1 TABLET BY MOUTH TWICE A DAY 180 tablet 3   lidocaine-prilocaine (EMLA) cream Apply 1 application topically as needed. 30 g 3   magnesium oxide (MAG-OX) 400 (240 Mg) MG tablet TAKE 1 TABLET BY MOUTH EVERY DAY 30 tablet 1   metFORMIN (GLUCOPHAGE-XR) 500 MG 24 hr tablet      metoprolol succinate (TOPROL-XL) 25 MG 24 hr tablet TAKE 1 TABLET (25 MG TOTAL) BY  MOUTH DAILY. 90 tablet 1   metoprolol succinate (TOPROL-XL) 50 MG 24 hr tablet Take 1 tablet (50 mg total) by mouth daily. Take with or immediately following a meal. 90 tablet 3   metoprolol tartrate (LOPRESSOR) 50 MG tablet      pantoprazole (PROTONIX) 40 MG tablet TAKE 1 TABLET BY MOUTH TWICE A DAY 180 tablet 2   pravastatin (PRAVACHOL) 40 MG tablet Take 40 mg by mouth daily.     regorafenib (STIVARGA) 40 MG tablet Take 2 tabs daily for first week, then increase to 3 tabs daily for second week and 4 tabs daily for third week, if tolerates well. Take one week off before next cycle. Take with low fat meal. Caution: Chemotherapy. 63 tablet 0   sucralfate (CARAFATE) 1 g tablet Take 1 tablet (1 g total) by mouth every 6 (six) hours as needed. Please schedule a yearly follow up for further refills: [phone] (Patient not taking: Reported on 10/10/2021) 60 tablet 0   tamsulosin (FLOMAX) 0.4 MG CAPS capsule Take 0.4 mg by mouth daily.     No current  facility-administered medications for this visit.    PHYSICAL EXAMINATION: ECOG PERFORMANCE STATUS: 1 - Symptomatic but completely ambulatory  There were no vitals filed for this visit.  Wt Readings from Last 3 Encounters:  05/08/22 214 lb 4.8 oz (97.2 kg)  03/14/22 214 lb 11.2 oz (97.4 kg)  02/13/22 218 lb 3 oz (99 kg)     GENERAL:alert, no distress and comfortable SKIN: skin color normal, no rashes or significant lesions EYES: normal, Conjunctiva are pink and non-injected, sclera clear  NEURO: alert & oriented x 3 with fluent speech  LABORATORY DATA:  I have reviewed the data as listed    Latest Ref Rng & Units 05/01/2022    9:06 AM 03/14/2022   10:58 AM 02/13/2022   12:34 PM  CBC  WBC 4.0 - 10.5 K/uL 4.5  3.7  4.4   Hemoglobin 13.0 - 17.0 g/dL 11.2  10.4  10.3   Hematocrit 39.0 - 52.0 % 34.9  32.7  32.0   Platelets 150 - 400 K/uL 106  115  96         Latest Ref Rng & Units 05/01/2022    9:06 AM 03/14/2022   10:58 AM 02/13/2022    12:34 PM  CMP  Glucose 70 - 99 mg/dL 106  164  128   BUN 8 - 23 mg/dL _0 Creatinine 0.61 - 1.24 mg/dL 1.35  1.33  1.30   Sodium 135 - 145 mmol/L 138  138  139   Potassium 3.5 - 5.1 mmol/L 4.2  3.6  4.2   Chloride 98 - 111 mmol/L 108  107  110   CO2 22 - 32 mmol/L _1 Calcium 8.9 - 10.3 mg/dL 9.7  9.2  9.2   Total Protein 6.5 - 8.1 g/dL 7.0  6.8  6.6   Total Bilirubin 0.3 - 1.2 mg/dL 0.4  0.4  0.4   Alkaline Phos 38 - 126 U/L 46  62  68   AST 15 - 41 U/L _2 ALT 0 - 44 U/L _3 RADIOGRAPHIC STUDIES: I have personally reviewed the radiological images as listed and agreed with the findings in the report. No results found.    No orders of the defined types were placed in this encounter.  All questions were answered. The patient knows to call the clinic with any problems, questions or concerns. No barriers to learning was detected. The total time spent in the appointment was 30 minutes.     [name], MD 05/28/2022   I, Wilburn Mylar, am acting as scribe for [name], MD.   I have reviewed the above documentation for accuracy and completeness, and I agree with the above.

## 2022-05-29 ENCOUNTER — Other Ambulatory Visit: Payer: Self-pay

## 2022-05-29 ENCOUNTER — Inpatient Hospital Stay: Payer: Medicare HMO | Attending: Hematology | Admitting: Hematology

## 2022-05-29 ENCOUNTER — Other Ambulatory Visit (HOSPITAL_COMMUNITY): Payer: Self-pay

## 2022-05-29 ENCOUNTER — Encounter: Payer: Self-pay | Admitting: Hematology

## 2022-05-29 ENCOUNTER — Inpatient Hospital Stay: Payer: Medicare HMO

## 2022-05-29 VITALS — BP 147/83 | HR 80 | Temp 98.4°F | Resp 17 | Ht 69.0 in | Wt 205.7 lb

## 2022-05-29 DIAGNOSIS — Z7984 Long term (current) use of oral hypoglycemic drugs: Secondary | ICD-10-CM | POA: Insufficient documentation

## 2022-05-29 DIAGNOSIS — Z95828 Presence of other vascular implants and grafts: Secondary | ICD-10-CM

## 2022-05-29 DIAGNOSIS — Z79899 Other long term (current) drug therapy: Secondary | ICD-10-CM | POA: Insufficient documentation

## 2022-05-29 DIAGNOSIS — I4891 Unspecified atrial fibrillation: Secondary | ICD-10-CM | POA: Insufficient documentation

## 2022-05-29 DIAGNOSIS — M109 Gout, unspecified: Secondary | ICD-10-CM | POA: Insufficient documentation

## 2022-05-29 DIAGNOSIS — I509 Heart failure, unspecified: Secondary | ICD-10-CM | POA: Diagnosis not present

## 2022-05-29 DIAGNOSIS — C221 Intrahepatic bile duct carcinoma: Secondary | ICD-10-CM

## 2022-05-29 DIAGNOSIS — Z66 Do not resuscitate: Secondary | ICD-10-CM | POA: Diagnosis not present

## 2022-05-29 DIAGNOSIS — E1140 Type 2 diabetes mellitus with diabetic neuropathy, unspecified: Secondary | ICD-10-CM | POA: Insufficient documentation

## 2022-05-29 DIAGNOSIS — I110 Hypertensive heart disease with heart failure: Secondary | ICD-10-CM | POA: Diagnosis not present

## 2022-05-29 LAB — CBC WITH DIFFERENTIAL/PLATELET
Abs Immature Granulocytes: 0.06 10*3/uL (ref 0.00–0.07)
Basophils Absolute: 0.1 10*3/uL (ref 0.0–0.1)
Basophils Relative: 1 %
Eosinophils Absolute: 0.1 10*3/uL (ref 0.0–0.5)
Eosinophils Relative: 1 %
HCT: 38.5 % — ABNORMAL LOW (ref 39.0–52.0)
Hemoglobin: 12.8 g/dL — ABNORMAL LOW (ref 13.0–17.0)
Immature Granulocytes: 1 %
Lymphocytes Relative: 15 %
Lymphs Abs: 1.1 10*3/uL (ref 0.7–4.0)
MCH: 29.2 pg (ref 26.0–34.0)
MCHC: 33.2 g/dL (ref 30.0–36.0)
MCV: 87.9 fL (ref 80.0–100.0)
Monocytes Absolute: 0.6 10*3/uL (ref 0.1–1.0)
Monocytes Relative: 8 %
Neutro Abs: 5.3 10*3/uL (ref 1.7–7.7)
Neutrophils Relative %: 74 %
Platelets: 159 10*3/uL (ref 150–400)
RBC: 4.38 MIL/uL (ref 4.22–5.81)
RDW: 15.6 % — ABNORMAL HIGH (ref 11.5–15.5)
WBC: 7.1 10*3/uL (ref 4.0–10.5)
nRBC: 0 % (ref 0.0–0.2)

## 2022-05-29 LAB — COMPREHENSIVE METABOLIC PANEL
ALT: 31 U/L (ref 0–44)
AST: 31 U/L (ref 15–41)
Albumin: 3.9 g/dL (ref 3.5–5.0)
Alkaline Phosphatase: 70 U/L (ref 38–126)
Anion gap: 7 (ref 5–15)
BUN: 18 mg/dL (ref 8–23)
CO2: 22 mmol/L (ref 22–32)
Calcium: 9 mg/dL (ref 8.9–10.3)
Chloride: 107 mmol/L (ref 98–111)
Creatinine, Ser: 1.31 mg/dL — ABNORMAL HIGH (ref 0.61–1.24)
GFR, Estimated: 57 mL/min — ABNORMAL LOW (ref >60)
Glucose, Bld: 209 mg/dL — ABNORMAL HIGH (ref 70–99)
Potassium: 3.7 mmol/L (ref 3.5–5.1)
Sodium: 136 mmol/L (ref 135–145)
Total Bilirubin: 0.6 mg/dL (ref 0.3–1.2)
Total Protein: 7.2 g/dL (ref 6.5–8.1)

## 2022-05-29 MED ORDER — SODIUM CHLORIDE 0.9% FLUSH
10.0000 mL | Freq: Once | INTRAVENOUS | Status: AC
  Administered 2022-05-29: 10 mL

## 2022-05-29 MED ORDER — REGORAFENIB 40 MG PO TABS
80.0000 mg | ORAL_TABLET | Freq: Every day | ORAL | 0 refills | Status: DC
  Filled 2022-05-29: qty 42, 21d supply, fill #0
  Filled 2022-05-31: qty 28, 14d supply, fill #0

## 2022-05-29 MED ORDER — HEPARIN SOD (PORK) LOCK FLUSH 100 UNIT/ML IV SOLN
500.0000 [IU] | Freq: Once | INTRAVENOUS | Status: AC
  Administered 2022-05-29: 500 [IU]

## 2022-05-31 ENCOUNTER — Other Ambulatory Visit (HOSPITAL_COMMUNITY): Payer: Self-pay

## 2022-05-31 ENCOUNTER — Other Ambulatory Visit: Payer: Self-pay

## 2022-05-31 ENCOUNTER — Telehealth: Payer: Self-pay

## 2022-05-31 ENCOUNTER — Other Ambulatory Visit: Payer: Self-pay | Admitting: Hematology

## 2022-05-31 NOTE — Telephone Encounter (Signed)
Spoke with Vicente Males in [place] regarding message Levada Dy PharmD sent to Dr. Burr Medico requesting new prescription for full quantity (42 tabs) of Stivarga.  Informed Vicente Males that the pt currently has 16 tabs leftover from his previous cycle and Dr. Burr Medico was trying to make sure the pt does not have a surplus of extra medicine on hand because the pt will be restaged after his next cycle at which time pt maybe taken off this medication based on those results.  Vicente Males verbalized understanding and stated she will notify the pharmacist Levada Dy.

## 2022-06-01 ENCOUNTER — Other Ambulatory Visit (HOSPITAL_COMMUNITY): Payer: Self-pay

## 2022-06-07 ENCOUNTER — Other Ambulatory Visit (HOSPITAL_COMMUNITY): Payer: Self-pay

## 2022-06-08 ENCOUNTER — Other Ambulatory Visit (HOSPITAL_COMMUNITY): Payer: Self-pay

## 2022-06-12 DIAGNOSIS — R339 Retention of urine, unspecified: Secondary | ICD-10-CM | POA: Diagnosis not present

## 2022-06-12 DIAGNOSIS — R972 Elevated prostate specific antigen [PSA]: Secondary | ICD-10-CM | POA: Diagnosis not present

## 2022-06-12 DIAGNOSIS — N401 Enlarged prostate with lower urinary tract symptoms: Secondary | ICD-10-CM | POA: Diagnosis not present

## 2022-06-13 ENCOUNTER — Telehealth: Payer: Self-pay

## 2022-06-13 NOTE — Telephone Encounter (Signed)
Wife returned call and stated patient is taking metoprolol succinate 24m daily. BP yesterday was 136/73, P 56. Patient and spouse will clarify met succ dosage with Dr. BGwenlyn Foundat 8/1 appointment. [facility] at [facility] advised.

## 2022-06-13 NOTE — Telephone Encounter (Signed)
Received call in to triage from [place] at [facility] in [place] who had a question about patient's metoprolol succinate prescription. Reviewed chart and last notation was for metoprolol succinate '25mg'$  daily. F[place]on Mole said she saw an order for '50mg'$  they received on 01/18/22. Reviewing chart more. Left message for patient to call back regarding dosage.

## 2022-06-13 NOTE — Telephone Encounter (Signed)
Wife returned call and stated patient is taking metoprolol succinate 50mg daily. BP yesterday was 136/73, P 56. Patient and spouse will clarify met succ dosage with Dr. Berry at 8/1 appointment. Whitney at [facility] advised. 

## 2022-06-20 ENCOUNTER — Ambulatory Visit: Payer: Medicare HMO | Admitting: Cardiovascular Disease

## 2022-06-20 ENCOUNTER — Encounter: Payer: Self-pay | Admitting: Cardiovascular Disease

## 2022-06-20 VITALS — BP 168/88 | HR 72 | Ht 69.0 in | Wt 202.8 lb

## 2022-06-20 DIAGNOSIS — E785 Hyperlipidemia, unspecified: Secondary | ICD-10-CM

## 2022-06-20 DIAGNOSIS — I2510 Atherosclerotic heart disease of native coronary artery without angina pectoris: Secondary | ICD-10-CM

## 2022-06-20 DIAGNOSIS — I4811 Longstanding persistent atrial fibrillation: Secondary | ICD-10-CM | POA: Diagnosis not present

## 2022-06-20 DIAGNOSIS — I10 Essential (primary) hypertension: Secondary | ICD-10-CM

## 2022-06-20 MED ORDER — ATORVASTATIN CALCIUM 40 MG PO TABS: 40.0000 mg | ORAL_TABLET | Freq: Every day | ORAL | 3 refills | Status: DC

## 2022-06-20 MED ORDER — HYDRALAZINE HCL 50 MG PO TABS: 75.0000 mg | ORAL_TABLET | Freq: Three times a day (TID) | ORAL | 3 refills | Status: DC

## 2022-06-20 NOTE — Assessment & Plan Note (Signed)
History of essential hypertension with blood pressure measured today at 168/88.  He is on metoprolol and hydralazine.  He has been on diltiazem in addition to that in the past but apparently this was discontinued.  I reviewed his blood pressure log over the last 3 days which revealed consistently elevated blood pressures.  I am going to increase his hydralazine from twice daily to 3 times daily.  We will keep a 30-day blood pressure log and see a Pharm.D. back in 4 weeks to review and make further recommendations.

## 2022-06-20 NOTE — Assessment & Plan Note (Signed)
History of hyperlipidemia on pravastatin 40 mg a day lipid profile performed 03/28/2022 revealing total cholesterol 150, LDL of 91 and HDL 39.  He is not at goal for secondary prevention given his elevated coronary calcium score of 931.  I am going to change him from pravastatin to atorvastatin 40 mg a day, we will check a lipid liver profile in 3 months.  LDL goal less than 70.

## 2022-06-20 NOTE — Assessment & Plan Note (Signed)
History of coronary calcification seen on prior chest CTs.  His coronary calcium score performed 04/13/2020 was 931.  He had subsequent negative Myoview stress test.

## 2022-06-20 NOTE — Patient Instructions (Signed)
Medication Instructions:   STOP PRAVASTATIN   START: ATORVASTATIN   START: HYDRALAZINE '75mg'$  THREE TIMES DAILY   *If you need a refill on your cardiac medications before your next appointment, please call your pharmacy*  Lab Work:  Please return for FASTING Blood Work in [place] . No appointment needed, lab here at the office is open Monday-Friday from 8AM to 4PM and closed daily for lunch from 12:45-1:45.   If you have labs (blood work) drawn today and your tests are completely normal, you will receive your results only by: [place] (if you have MyChart) OR A paper copy in the mail If you have any lab test that is abnormal or we need to change your treatment, we will call you to review the results.  Testing/Procedures: None Ordered At This Time.   Follow-Up: At [facility], you and your health needs are our priority.  As part of our continuing mission to provide you with exceptional heart care, we have created designated Provider Care Teams.  These Care Teams include your primary Cardiologist (physician) and Advanced Practice Providers (APPs -  Physician Assistants and Nurse Practitioners) who all work together to provide you with the care you need, when you need it.  PLEASE SCHEDULE PHARMD IN 4 WEEKS FOR BP CHECK  Your next appointment:   6 month(s)  The format for your next appointment:   In Person  Provider:   ANY APP    Then, Quay Burow, MD will plan to see you again in 6 month(s).   Other Instructions Please check your blood pressure at home daily, write it down FOR 30 DAYS. BRING THIS TO YOUR PHARMACY APPOINTMENT IN 30 DAYS.

## 2022-06-20 NOTE — Progress Notes (Signed)
06/20/2022 Brandon Sherman   1946/03/26  [id number]  Primary Physician Marga Hoots, NP Primary Cardiologist: Lorretta Harp MD [name], [place]  HPI:  Brandon Sherman is a 76 y.o.  moderately overweight married Caucasian male father of 25, grandfather of 5 grandchildren who is retired from working at [employer] for 52 years .  His wife Brandon Sherman is also a patient of mine, and accompanies him today.  He was referred by Ihor Dow, PA for atrial fibrillation.  I last saw him in the office 06/14/2021. His risk factors include treated hypertension, diabetes and hyperlipidemia.  Both his mother and sister had known CAD.  He is never had a heart attack or stroke.  He unfortunately was diagnosed with liver cancer back in September of last year and is begun on chemotherapy since that time.  A Port-A-Cath was placed 2 months ago which time he was noted to be in A. fib and he was started on Eliquis 3 weeks ago by his PCP.  He has complained of increased dyspnea on exertion over the last 6 months but denies chest pain.  He does not have history of heavy alcohol abuse which was discontinued at the time of his liver cancer diagnosis.   After being on appropriate doses of Eliquis for at least 4 weeks he underwent successful outpatient cardioversion by Dr. Meda Coffee 04/27/2020.  He apparently went back in A. fib a week later.  Does complain of some dyspnea on exertion.  He has normal LV function by 2D echo and a negative Myoview stress test.  He does have hypertension with blood pressures in the 170 range.   Since I saw him a year ago he continues to do well.  He still has a Port-A-Cath in.  He remains on Eliquis oral anticoagulation for PAF although he is in sinus rhythm today.  He denies chest pain or shortness of breath.  His blood pressures however have trended up recently.     Current Meds  Medication Sig   allopurinol (ZYLOPRIM) 100 MG tablet Take 100 mg by mouth daily.   colchicine 0.6 MG  tablet Take 1 tablet (0.6 mg total) by mouth daily as needed (gout flare). Take daily for 3 days, then as needed for gout flare   ELIQUIS 5 MG TABS tablet TAKE 1 TABLET TWICE DAILY   fenofibrate (TRICOR) 145 MG tablet Take 145 mg by mouth daily.   finasteride (PROSCAR) 5 MG tablet Take 5 mg by mouth daily.   furosemide (LASIX) 40 MG tablet TAKE 2 TABLETS (80 MG TOTAL) BY MOUTH 2 (TWO) TIMES DAILY.   gabapentin (NEURONTIN) 100 MG capsule Take 100 mg by mouth at bedtime.   hydrALAZINE (APRESOLINE) 50 MG tablet Take 1 tablet (50 mg total) by mouth 2 (two) times daily.   KLOR-CON M20 20 MEQ tablet TAKE 1 TABLET BY MOUTH TWICE A DAY   lidocaine-prilocaine (EMLA) cream Apply 1 application topically as needed.   magnesium oxide (MAG-OX) 400 (240 Mg) MG tablet TAKE 1 TABLET BY MOUTH EVERY DAY   metFORMIN (GLUCOPHAGE-XR) 500 MG 24 hr tablet    metoprolol succinate (TOPROL-XL) 50 MG 24 hr tablet Take 1 tablet (50 mg total) by mouth daily. Take with or immediately following a meal.   metoprolol tartrate (LOPRESSOR) 50 MG tablet    pantoprazole (PROTONIX) 40 MG tablet TAKE 1 TABLET BY MOUTH TWICE A DAY   regorafenib (STIVARGA) 40 MG tablet Take 2 tablets (80 mg total)  by mouth daily. Take for 3 weeks then off for one week. Pt has 16 left from last cycle   sucralfate (CARAFATE) 1 g tablet Take 1 tablet (1 g total) by mouth every 6 (six) hours as needed. Please schedule a yearly follow up for further refills: [phone]   tamsulosin (FLOMAX) 0.4 MG CAPS capsule Take 0.4 mg by mouth daily.   [DISCONTINUED] pravastatin (PRAVACHOL) 40 MG tablet Take 40 mg by mouth daily.     No Known Allergies  Social History   Socioeconomic History   Marital status: Married    Spouse name: Not on file   Number of children: 3   Years of education: Not on file   Highest education level: Not on file  Occupational History   Not on file  Tobacco Use   Smoking status: Never   Smokeless tobacco: Current    Types: Chew    Tobacco comments:    for 60 years, 1-2 daily   Vaping Use   Vaping Use: Never used  Substance and Sexual Activity   Alcohol use: Not Currently    Comment: occasionally   Drug use: Never   Sexual activity: Not on file  Other Topics Concern   Not on file  Social History Narrative   Not on file   Social Determinants of Health   Financial Resource Strain: Not on file  Food Insecurity: Not on file  Transportation Needs: No Transportation Needs (05/21/2020)   PRAPARE - Hydrologist (Medical): No    Lack of Transportation (Non-Medical): No  Physical Activity: Insufficiently Active (05/21/2020)   Exercise Vital Sign    Days of Exercise per Week: 1 day    Minutes of Exercise per Session: 50 min  Stress: Not on file  Social Connections: Unknown (05/21/2020)   Social Connection and Isolation Panel [NHANES]    Frequency of Communication with Friends and Family: More than three times a week    Frequency of Social Gatherings with Friends and Family: More than three times a week    Attends Religious Services: Not on Advertising copywriter or Organizations: Not on file    Attends Archivist Meetings: Not on file    Marital Status: Married  Human resources officer Violence: Not on file     Review of Systems: General: negative for chills, fever, night sweats or weight changes.  Cardiovascular: negative for chest pain, dyspnea on exertion, edema, orthopnea, palpitations, paroxysmal nocturnal dyspnea or shortness of breath Dermatological: negative for rash Respiratory: negative for cough or wheezing Urologic: negative for hematuria Abdominal: negative for nausea, vomiting, diarrhea, bright red blood per rectum, melena, or hematemesis Neurologic: negative for visual changes, syncope, or dizziness All other systems reviewed and are otherwise negative except as noted above.    Blood pressure (!) 168/88, pulse 72, height '5\' 9"'$  (1.753 m), weight 202 lb  12.8 oz (92 kg), SpO2 95 %.  General appearance: alert and no distress Neck: no adenopathy, no carotid bruit, no JVD, supple, symmetrical, trachea midline, and thyroid not enlarged, symmetric, no tenderness/mass/nodules Lungs: clear to auscultation bilaterally Heart: regular rate and rhythm, S1, S2 normal, no murmur, click, rub or gallop Extremities: extremities normal, atraumatic, no cyanosis or edema Pulses: 2+ and symmetric Skin: Skin color, texture, turgor normal. No rashes or lesions Neurologic: Grossly normal  EKG sinus rhythm at 72 with sinus arrhythmia left axis deviation.  I personally reviewed this EKG.  ASSESSMENT AND PLAN:  Dyslipidemia, goal LDL below 70 History of hyperlipidemia on pravastatin 40 mg a day lipid profile performed 03/28/2022 revealing total cholesterol 150, LDL of 91 and HDL 39.  He is not at goal for secondary prevention given his elevated coronary calcium score of 931.  I am going to change him from pravastatin to atorvastatin 40 mg a day, we will check a lipid liver profile in 3 months.  LDL goal less than 70.  Essential hypertension History of essential hypertension with blood pressure measured today at 168/88.  He is on metoprolol and hydralazine.  He has been on diltiazem in addition to that in the past but apparently this was discontinued.  I reviewed his blood pressure log over the last 3 days which revealed consistently elevated blood pressures.  I am going to increase his hydralazine from twice daily to 3 times daily.  We will keep a 30-day blood pressure log and see a Pharm.D. back in 4 weeks to review and make further recommendations.  Atrial fibrillation (HCC) History of paroxysmal A-fib on Eliquis oral anticoagulation.  He is in sinus rhythm today.  Coronary artery calcification seen on CT scan History of coronary calcification seen on prior chest CTs.  His coronary calcium score performed 04/13/2020 was 931.  He had subsequent negative Myoview stress  test.     Lorretta Harp MD [facility], [facility] 06/20/2022 3:24 PM

## 2022-06-20 NOTE — Assessment & Plan Note (Signed)
History of paroxysmal A-fib on Eliquis oral anticoagulation.  He is in sinus rhythm today.

## 2022-06-21 DIAGNOSIS — R339 Retention of urine, unspecified: Secondary | ICD-10-CM | POA: Diagnosis not present

## 2022-06-22 ENCOUNTER — Other Ambulatory Visit (HOSPITAL_COMMUNITY): Payer: Self-pay

## 2022-06-26 ENCOUNTER — Inpatient Hospital Stay (HOSPITAL_BASED_OUTPATIENT_CLINIC_OR_DEPARTMENT_OTHER): Payer: Medicare HMO | Admitting: Hematology

## 2022-06-26 ENCOUNTER — Encounter: Payer: Self-pay | Admitting: Hematology

## 2022-06-26 ENCOUNTER — Other Ambulatory Visit (HOSPITAL_COMMUNITY): Payer: Self-pay

## 2022-06-26 ENCOUNTER — Other Ambulatory Visit: Payer: Self-pay

## 2022-06-26 ENCOUNTER — Inpatient Hospital Stay: Payer: Medicare HMO | Attending: Hematology

## 2022-06-26 VITALS — BP 159/73 | HR 65 | Temp 98.6°F | Resp 19 | Ht 69.0 in | Wt 203.9 lb

## 2022-06-26 DIAGNOSIS — I4891 Unspecified atrial fibrillation: Secondary | ICD-10-CM | POA: Diagnosis not present

## 2022-06-26 DIAGNOSIS — I509 Heart failure, unspecified: Secondary | ICD-10-CM | POA: Insufficient documentation

## 2022-06-26 DIAGNOSIS — M109 Gout, unspecified: Secondary | ICD-10-CM | POA: Insufficient documentation

## 2022-06-26 DIAGNOSIS — E1140 Type 2 diabetes mellitus with diabetic neuropathy, unspecified: Secondary | ICD-10-CM | POA: Insufficient documentation

## 2022-06-26 DIAGNOSIS — C221 Intrahepatic bile duct carcinoma: Secondary | ICD-10-CM | POA: Diagnosis not present

## 2022-06-26 DIAGNOSIS — I110 Hypertensive heart disease with heart failure: Secondary | ICD-10-CM | POA: Diagnosis not present

## 2022-06-26 DIAGNOSIS — G620 Drug-induced polyneuropathy: Secondary | ICD-10-CM | POA: Diagnosis not present

## 2022-06-26 DIAGNOSIS — Z95828 Presence of other vascular implants and grafts: Secondary | ICD-10-CM

## 2022-06-26 DIAGNOSIS — Z66 Do not resuscitate: Secondary | ICD-10-CM | POA: Diagnosis not present

## 2022-06-26 DIAGNOSIS — Z7901 Long term (current) use of anticoagulants: Secondary | ICD-10-CM | POA: Diagnosis not present

## 2022-06-26 DIAGNOSIS — T451X5A Adverse effect of antineoplastic and immunosuppressive drugs, initial encounter: Secondary | ICD-10-CM | POA: Diagnosis not present

## 2022-06-26 DIAGNOSIS — Z79899 Other long term (current) drug therapy: Secondary | ICD-10-CM | POA: Insufficient documentation

## 2022-06-26 DIAGNOSIS — Z7984 Long term (current) use of oral hypoglycemic drugs: Secondary | ICD-10-CM | POA: Diagnosis not present

## 2022-06-26 LAB — CBC WITH DIFFERENTIAL/PLATELET
Abs Immature Granulocytes: 0.02 10*3/uL (ref 0.00–0.07)
Basophils Absolute: 0 10*3/uL (ref 0.0–0.1)
Basophils Relative: 1 %
Eosinophils Absolute: 0 10*3/uL (ref 0.0–0.5)
Eosinophils Relative: 1 %
HCT: 37.7 % — ABNORMAL LOW (ref 39.0–52.0)
Hemoglobin: 12.4 g/dL — ABNORMAL LOW (ref 13.0–17.0)
Immature Granulocytes: 0 %
Lymphocytes Relative: 18 %
Lymphs Abs: 1 10*3/uL (ref 0.7–4.0)
MCH: 29.2 pg (ref 26.0–34.0)
MCHC: 32.9 g/dL (ref 30.0–36.0)
MCV: 88.9 fL (ref 80.0–100.0)
Monocytes Absolute: 0.5 10*3/uL (ref 0.1–1.0)
Monocytes Relative: 9 %
Neutro Abs: 3.9 10*3/uL (ref 1.7–7.7)
Neutrophils Relative %: 71 %
Platelets: 129 10*3/uL — ABNORMAL LOW (ref 150–400)
RBC: 4.24 MIL/uL (ref 4.22–5.81)
RDW: 16.2 % — ABNORMAL HIGH (ref 11.5–15.5)
WBC: 5.4 10*3/uL (ref 4.0–10.5)
nRBC: 0 % (ref 0.0–0.2)

## 2022-06-26 LAB — COMPREHENSIVE METABOLIC PANEL
ALT: 30 U/L (ref 0–44)
AST: 33 U/L (ref 15–41)
Albumin: 3.8 g/dL (ref 3.5–5.0)
Alkaline Phosphatase: 58 U/L (ref 38–126)
Anion gap: 5 (ref 5–15)
BUN: 15 mg/dL (ref 8–23)
CO2: 24 mmol/L (ref 22–32)
Calcium: 8.8 mg/dL — ABNORMAL LOW (ref 8.9–10.3)
Chloride: 109 mmol/L (ref 98–111)
Creatinine, Ser: 1.39 mg/dL — ABNORMAL HIGH (ref 0.61–1.24)
GFR, Estimated: 53 mL/min — ABNORMAL LOW (ref >60)
Glucose, Bld: 161 mg/dL — ABNORMAL HIGH (ref 70–99)
Potassium: 4 mmol/L (ref 3.5–5.1)
Sodium: 138 mmol/L (ref 135–145)
Total Bilirubin: 0.5 mg/dL (ref 0.3–1.2)
Total Protein: 7 g/dL (ref 6.5–8.1)

## 2022-06-26 MED ORDER — REGORAFENIB 40 MG PO TABS
80.0000 mg | ORAL_TABLET | Freq: Every day | ORAL | 1 refills | Status: DC
  Filled 2022-06-26: qty 42, 21d supply, fill #0
  Filled 2022-07-19: qty 42, 21d supply, fill #1

## 2022-06-26 MED ORDER — HEPARIN SOD (PORK) LOCK FLUSH 100 UNIT/ML IV SOLN
500.0000 [IU] | Freq: Once | INTRAVENOUS | Status: AC
  Administered 2022-06-26: 500 [IU]

## 2022-06-26 MED ORDER — SODIUM CHLORIDE 0.9% FLUSH
10.0000 mL | Freq: Once | INTRAVENOUS | Status: AC
  Administered 2022-06-26: 10 mL

## 2022-06-26 NOTE — Progress Notes (Signed)
[place]   [phone]) [phone] [phone]   Clinic Follow up Note   Patient Care Team: Marga Hoots, NP as PCP - General (Adult Health Nurse Practitioner) Lorretta Harp, MD as PCP - Cardiology (Cardiology) [name], MD as Consulting Physician (General Surgery) [name], [name], MD as Consulting Physician (Gastroenterology) [name], MD as Consulting Physician (Hematology) Lorretta Harp, MD as Consulting Physician (Cardiology)  Date of Service:  06/26/2022  CHIEF COMPLAINT: f/u of cholangiocarcinoma  CURRENT THERAPY:  Stivarga, q28d, starting 05/15/22 -dose: 80 mg daily d1-21  ASSESSMENT & PLAN:  Brandon Sherman is a 76 y.o. male with   1. Intrahepatic cholangiocarcinoma, cT2N0Mx, unresectable, with indeterminate lung nodules, IDH mutation (+) -Diagnosed in 07/2019. CT scans and MRI liver show a large 7.3cm mass in the right hepatic lobe which abuts portal vein. Both our local surgeon Dr. Barry Dienes and Dr Carlis Abbott at [facility] concluded that cancer is not resectable, and therefore not likely curable, due to the invasion to portal vein. -He received palliative systemic treatment, standard first line chemo with IV Cisplatin and Gemcitabine 2 weeks on/1 week off, 09/29/19-07/19/20. Cisplatin discontinued 03/23/20 due to Afib. -His FO results showed MSI stable disease, IDH1 mutations he may benefit from IDH inhibitor  -he progressed through single agent gemcitabine (12/24/20-03/16/21), FOLFOX plus durvalumab (04/05/21-10/21/21), FOLFIRI (11/01/21-01/19/22), and then tried ivosidenib (02/02/22 - 04/2022) -restaging abdomen MRI and chest CT on 04/24/22 showed: similar size of dominant liver mass with increased vascular involvement; enlargement of satellite liver tumor and RLL pulmonary nodule. -he switched to [place] on 05/15/22, he tolerated low-dose 80 mg daily well, but developed multiple side effects on 120 mg daily.  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 Goal of care discussion, DNR -The patient understands the goal of care is palliative. -DNR/DNI signed 02/13/22. A copy of his living will has been scanned into his chart.   4. Afib, CHF -diagnosed after PAC placement on 02/23/20. -CHADS2 score 2, moderate risk for stroke. Started on metoprolol and eliquis on 03/01/20. Tolerating well without bleeding.  -He had cardioversion on 05/19/20 -f/u with cardiologist Dr. Gwenlyn Found.   5. DM, HTN, HLD, Gout -On Metformin, amlodipine, lisinopril, lasix, allopurinol -Continue to f/u with his PCP      Plan: -continue Stivarga at same dose (80 mg daily) -f/u in 6-7 weeks, with lab/flush and abd MRI, chest CT several days before   No problem-specific Assessment & Plan notes found for this encounter.   SUMMARY OF ONCOLOGIC HISTORY: Oncology History Overview Note  Cancer Staging Intrahepatic cholangiocarcinoma ([place]) Staging form: Intrahepatic Bile Duct, AJCC 8th Edition - Clinical stage from 08/19/2019: Stage II (cT2, cN0, cM0) - Signed by [name], MD on 08/19/2019    Intrahepatic cholangiocarcinoma ([place])  06/28/2019 Imaging   CT AP W Contrast 06/28/19  IMPRESSION: 1. Heterogeneous hypodensity posteriorly in the right hepatic lobe and potentially extending into the caudate lobe suspicious for a mass. There is felt to be truncation of branches of the portal vein in this vicinity and some narrowing of the hepatic vein, as well as triangular-shaped regions of abnormal hypoenhancement posteriorly in the right hepatic lobe likely representing downstream vascular effects. Cannot exclude malignancy such as cholangiocarcinoma or hepatocellular  carcinoma, and follow up hepatic protocol MRI with and without contrast is recommended to further characterize. 2. 4 mm right middle  lobe pulmonary nodule is likely benign but may merit surveillance. 3. Cholelithiasis. 4.  Aortic Atherosclerosis (ICD10-I70.0). 5. Prostatomegaly. 6. Mild impingement at L3-4 and L4-5.   07/31/2019 Imaging   MRI Liver 07/31/19 IMPRESSION: 1. 7.3 cm in long axis mass in the right hepatic lobe spanning into the caudate lobe, high suspicion for malignancy such as hepatocellular carcinoma or cholangiocarcinoma. Suspected effacement or occlusion of the right hepatic vein and posterior branches of the right portal vein. Two smaller tumor nodules along the posterior periphery of the dominant mass. Tissue diagnosis is recommended. 2. No findings of pathologic adenopathy or distant metastatic spread. 3. 9 mm gallstone in the gallbladder. There is mild gallbladder wall thickening which may be from nondistention, correlate clinically in assessing for cholecystitis. 4.  Aortic Atherosclerosis (ICD10-I70.0). 5. Mild diffuse hepatic steatosis.   08/11/2019 Initial Biopsy   DIAGNOSIS: 08/11/19  A. LIVER, RIGHT, BIOPSY:  - Adenocarcinoma.   08/18/2019 Imaging   CT Chest 08/18/19  IMPRESSION: 1. Multiple pulmonary nodules largest at approximately 7 mm in the right lower lobe, nonspecific but concerning given findings in the liver. 2. No signs of definitive metastatic disease, also with mildly enlarged upper abdominal lymph nodes as discussed.   Aortic Atherosclerosis (ICD10-I70.0).   08/19/2019 Initial Diagnosis   Intrahepatic cholangiocarcinoma ([place])   08/19/2019 Cancer Staging   Staging form: Intrahepatic Bile Duct, AJCC 8th Edition - Clinical stage from 08/19/2019: Stage II (cT2, cN0, cM0) - Signed by [name], MD on 08/19/2019   09/29/2019 - 07/19/2020 Chemotherapy   Cisplatin and Gemcitabine 2 weeks on/1 week off starting 09/29/19. Cisplatin held from cycle 9 (03/23/20) due to fluid status/Afib. He is now on maintenance Gemcitabine. Held after 07/19/20 to proceed with liver target therapy.      10/09/2019 Imaging   CT AP IMPRESSION: 1. The dominant right hepatic lobe mass is minimally reduced in size compared to prior exams, currently measuring 6.2 by 5.3 cm, previously 6.2 by 5.6 cm. However, there is a new small hypodense lesion centrally in the right hepatic lobe which is suspicious for a new small focus of tumor. Accordingly this is an overall mixed appearance. 2. Continued hypoenhancement in the liver downstream of the tumor likely attributable to narrowing or occlusion of the right hepatic vein by the tumor. By virtue of its location the tumor wraps around the intrahepatic portion of the IVC. 3. 4 mm right middle lobe pulmonary nodule, stable compared to earliest available comparison of 07/18/2019. Surveillance of the patient's pulmonary nodules is recommended. 4. Other imaging findings of potential clinical significance: Coronary atherosclerosis. Cholelithiasis. Prominent stool throughout the colon favors constipation. Moderate prostatomegaly with heterogeneous enhancement of the prostate gland. Lumbar spondylosis and degenerative disc disease causing mild bilateral foraminal impingement at L3-4 and L4-5.   Aortic Atherosclerosis (ICD10-I70.0).   12/24/2019 Imaging   CT CAP W Contrast  IMPRESSION: 1. Right hepatic lobe mass and adjacent right hepatic lobe nodules appear grossly stable. No evidence of distant metastatic disease. 2. Continued stability of small pulmonary nodules. Recommend attention on follow-up. 3. Cholelithiasis. 4. Enlarged prostate. 5. Aortic atherosclerosis (ICD10-I70.0). Coronary artery calcification.   02/23/2020 Procedure   He had PAC placed on 02/23/20.    04/08/2020 Imaging   CT CAP  IMPRESSION: 1. Stable or minimally decreased size of a very ill-defined, hypodense and somewhat retractile appearing mass of the central right lobe of  the liver abutting the inferior vena cava and right portal vein, measuring approximately 5.7 x 5.0 cm, previously 6.2  x 5.2 cm when measured similarly. Findings are consistent with stable or minimally improved cholangiocarcinoma. 2. Small hypodense nodules of the right lobe identified on prior examination are poorly appreciated on this single phase contrast examination although not grossly changed. Attention on follow-up. 3. There are new, moderate bilateral pleural effusions and associated atelectasis or consolidation as well as a new small pericardial effusion, nonspecific although generally concerning and suspicious for malignant effusions. There is no directly visualized pleural nodularity. 4. Multiple small pulmonary nodules are stable.  No new nodules. 5. Coronary artery disease. Aortic Atherosclerosis (ICD10-I70.0).   07/06/2020 Imaging   MRI ABD IMPRESSION: 1. Interval decrease in size of the right hepatic lobe lesion in the medial aspect of the segment 7. Findings would suggest a good response to treatment with some contraction of the tumor. 2. No new hepatic lesions. No abdominal adenopathy or metastatic disease. 3. Stable mild intrahepatic biliary dilatation in the right hepatic lobe distal to the lesion. 4. Somewhat tortuous and almost beaded appearance of the hepatic and portal vein radicles. Findings could be radiation related. 5. Cholelithiasis.  CT chest wo contrast IMPRESSION: 1. Persistent/stable moderate-sized bilateral pleural effusions with overlying atelectasis. 2. Stable small right pulmonary nodules. No new or progressive findings. Recommend continued surveillance. 3. No mediastinal or hilar mass or adenopathy. 4. Stable advanced three-vessel coronary artery calcifications. 5. Cholelithiasis. Aortic Atherosclerosis (ICD10-I70.0)   08/11/2020 Procedure   Y90 on 08/11/20 and 08/26/20 with Dr Laurence Ferrari    11/30/2020 Imaging   MRI Abdomen  IMPRESSION: 1. Today's study demonstrates progression of disease with enlarging central lesion in the right lobe of the liver involving  portions of segments 7 and 8, now with evidence of some tumor thrombus extension into the intrahepatic portion of the inferior vena cava. This is associated with increasing intrahepatic biliary ductal dilatation in segment 7 of the liver. 2. In addition, there is some subtle hyperenhancement of the distal common bile duct at and immediately proximally to the level of the ampulla. There is also some subtle delayed enhancement around this region in the pancreatic head. This is of uncertain etiology and significance, but may be inflammatory and currently is not associated with proximal common bile duct dilatation. However, close attention on follow-up imaging is recommended. 3. Cholelithiasis without evidence of acute cholecystitis at this time. 4. Aortic atherosclerosis.     12/24/2020 - 03/21/2021 Chemotherapy   Restart Gemcitabine 2 weeks on/1 week off given disease progression beginning 12/24/20. Discontinued after 03/21/21 due to disease progression in liver.     03/16/2021 Imaging   MRI abdomen  IMPRESSION: No significant change in size of irregular hypovascular mass in the medial right hepatic lobe.   New 1.1 cm hypovascular lesion with peripheral rim enhancement in liver dome, suspicious for metastatic disease.   New small right pleural effusion and mild ascites.   Cholelithiasis, without evidence of cholecystitis or biliary dilatation.   04/05/2021 -  Chemotherapy   Second-line FOLFOX q2weeks starting 04/05/21    04/05/2021 -  Chemotherapy   Immunotherapy with Durvalumab (Imfinzi) q4weeks starting 04/05/21. (in addition to second line FOLFOX)   06/23/2021 Imaging   MRI Abdomen IMPRESSION: 1. No significant change in subcapsular mass of the posterior right lobe of the liver. 2. No significant change in an additional small hyperenhancing lesion of the liver dome, which remains suspicious for a satellite lesion. 3. No new liver  lesions. 4. Redemonstrated nonocclusive tumor thrombus  within the IVC. 5. Cholelithiasis without evidence of acute cholecystitis. 6. Trace ascites.  CT Chest w/o contrast IMPRESSION: 1. Occasional small pulmonary nodules in the right lung are stable, for example a 6 mm nodule right lower lobe and a 3 mm nodule of the lateral segment right middle lobe. These are nonspecific and likely benign, incidental sequelae of infection or inflammation, metastatic disease not favored. Attention on follow-up. 2. Previously seen bilateral pleural effusions are resolved. 3. Hypodense lesion of the posterior right lobe of the liver, keeping with known cholangiocarcinoma and better characterized by same day MR. 4. Coronary artery disease.   10/21/2021 Imaging   EXAM: MRI ABDOMEN WITHOUT AND WITH CONTRAST  IMPRESSION: 1. Mild progression of dominant right hepatic lobe mass with involvement of the right portal, hepatic veins and minimal extension into the IVC. 2. Hepatic dome satellite lesion, new or more conspicuous today. 3. Cholelithiasis 4. Decrease in trace perihepatic and perisplenic ascites. 5.  Aortic Atherosclerosis (ICD10-I70.0).   10/21/2021 Imaging   EXAM: CT CHEST WITHOUT CONTRAST  IMPRESSION: 1. Since 06/23/2021, enlargement of a right lower lobe and possible enlargement of a right middle lobe pulmonary nodule. Findings are suspicious for metastatic disease. 2. No thoracic adenopathy. 3. Coronary artery atherosclerosis. Aortic Atherosclerosis (ICD10-I70.0).   11/01/2021 - 01/11/2022 Chemotherapy   Patient is on Treatment Plan : PANCREAS FOLFIRI q14d     01/19/2022 Imaging   EXAM: CT CHEST WITHOUT CONTRAST  IMPRESSION: 1. Continued increase in size of lung nodule within the posterior right lung base which now measures 1 cm and is suspicious for metastatic disease. The remaining lung nodules are stable from 10/21/2021. 2. No thoracic adenopathy. 3. Aortic Atherosclerosis (ICD10-I70.0). Coronary artery calcifications.   01/19/2022  Imaging   EXAM: MRI ABDOMEN WITHOUT AND WITH CONTRAST  IMPRESSION: 1. No significant change in size or appearance of the large heterogeneous enhancing mass area in the right hepatic lobe as described. There is mild interval enlargement of the adjacent satellite lesion near the dome of the right lobe now measuring 1.9 cm. 2. Redemonstration of a pulmonary nodule in the right lower lobe measuring 9 mm. 3. Small volume ascites. 4. Splenomegaly. 5. Cholelithiasis.      INTERVAL HISTORY:  Brandon Sherman is here for a follow up of cholangiocarcinoma. He was last seen by me on 05/29/22. He presents to the clinic accompanied by his wife. He reports he is doing well overall, notes he is tolerating 80 mg Stivarga well with no noticeable side effects.   All other systems were reviewed with the patient and are negative.  MEDICAL HISTORY:  Past Medical History:  Diagnosis Date   Arthritis    Diabetes ([place])    GERD (gastroesophageal reflux disease)    Hyperlipidemia    Hypertension    Intrahepatic cholangiocarcinoma ([place])     SURGICAL HISTORY: Past Surgical History:  Procedure Laterality Date   APPENDECTOMY  1980   CARDIOVERSION N/A 04/27/2020   Procedure: CARDIOVERSION;  Surgeon: Dorothy Spark, MD;  Location: [facility] ENDOSCOPY;  Service: Cardiovascular;  Laterality: N/A;   CARDIOVERSION N/A 05/19/2020   Procedure: CARDIOVERSION;  Surgeon: Elouise Munroe, MD;  Location: [facility];  Service: Cardiovascular;  Laterality: N/A;   COLONOSCOPY     IR 3D INDEPENDENT WKST  08/11/2020   IR 3D INDEPENDENT WKST  08/11/2020   IR ANGIOGRAM SELECTIVE EACH ADDITIONAL VESSEL  08/11/2020   IR ANGIOGRAM SELECTIVE EACH ADDITIONAL VESSEL  08/11/2020   IR  ANGIOGRAM SELECTIVE EACH ADDITIONAL VESSEL  08/11/2020   IR ANGIOGRAM SELECTIVE EACH ADDITIONAL VESSEL  08/11/2020   IR ANGIOGRAM SELECTIVE EACH ADDITIONAL VESSEL  08/11/2020   IR ANGIOGRAM SELECTIVE EACH ADDITIONAL VESSEL  08/11/2020   IR ANGIOGRAM  SELECTIVE EACH ADDITIONAL VESSEL  08/26/2020   IR ANGIOGRAM SELECTIVE EACH ADDITIONAL VESSEL  08/26/2020   IR ANGIOGRAM VISCERAL SELECTIVE  08/11/2020   IR ANGIOGRAM VISCERAL SELECTIVE  08/26/2020   IR EMBO ARTERIAL NOT HEMORR HEMANG INC GUIDE ROADMAPPING  08/11/2020   IR EMBO TUMOR ORGAN ISCHEMIA INFARCT INC GUIDE ROADMAPPING  08/26/2020   IR IMAGING GUIDED PORT INSERTION  02/23/2020   IR RADIOLOGIST EVAL & MGMT  07/14/2020   IR RADIOLOGIST EVAL & MGMT  09/30/2020   IR RADIOLOGIST EVAL & MGMT  12/01/2020   IR US GUIDE VASC ACCESS LEFT  08/26/2020   IR US GUIDE VASC ACCESS RIGHT  08/11/2020    I have reviewed the social history and family history with the patient and they are unchanged from previous note.  ALLERGIES:  has No Known Allergies.  MEDICATIONS:  Current Outpatient Medications  Medication Sig Dispense Refill   allopurinol (ZYLOPRIM) 100 MG tablet Take 100 mg by mouth daily.     atorvastatin (LIPITOR) 40 MG tablet Take 1 tablet (40 mg total) by mouth daily. 90 tablet 3   colchicine 0.6 MG tablet Take 1 tablet (0.6 mg total) by mouth daily as needed (gout flare). Take daily for 3 days, then as needed for gout flare 30 tablet 0   ELIQUIS 5 MG TABS tablet TAKE 1 TABLET TWICE DAILY 180 tablet 1   fenofibrate (TRICOR) 145 MG tablet Take 145 mg by mouth daily.     finasteride (PROSCAR) 5 MG tablet Take 5 mg by mouth daily.     furosemide (LASIX) 40 MG tablet TAKE 2 TABLETS (80 MG TOTAL) BY MOUTH 2 (TWO) TIMES DAILY. 360 tablet 3   gabapentin (NEURONTIN) 100 MG capsule Take 100 mg by mouth at bedtime.     hydrALAZINE (APRESOLINE) 50 MG tablet Take 1.5 tablets (75 mg total) by mouth 3 (three) times daily. 135 tablet 3   KLOR-CON M20 20 MEQ tablet TAKE 1 TABLET BY MOUTH TWICE A DAY 180 tablet 3   lidocaine-prilocaine (EMLA) cream Apply 1 application topically as needed. 30 g 3   magnesium oxide (MAG-OX) 400 (240 Mg) MG tablet TAKE 1 TABLET BY MOUTH EVERY DAY 30 tablet 1   metFORMIN  (GLUCOPHAGE-XR) 500 MG 24 hr tablet      metoprolol succinate (TOPROL-XL) 50 MG 24 hr tablet Take 1 tablet (50 mg total) by mouth daily. Take with or immediately following a meal. 90 tablet 3   pantoprazole (PROTONIX) 40 MG tablet TAKE 1 TABLET BY MOUTH TWICE A DAY 180 tablet 2   regorafenib (STIVARGA) 40 MG tablet Take 2 tablets (80 mg total) by mouth daily. Take for 3 weeks then off for one week. 42 tablet 1   sucralfate (CARAFATE) 1 g tablet Take 1 tablet (1 g total) by mouth every 6 (six) hours as needed. Please schedule a yearly follow up for further refills: [phone] 60 tablet 0   tamsulosin (FLOMAX) 0.4 MG CAPS capsule Take 0.4 mg by mouth daily.     No current facility-administered medications for this visit.    PHYSICAL EXAMINATION: ECOG PERFORMANCE STATUS: 1 - Symptomatic but completely ambulatory  Vitals:   06/26/22 1117  BP: (!) 159/73  Pulse: 65  Resp: 19  Temp: 98.6  F (37 C)  SpO2: 97%   Wt Readings from Last 3 Encounters:  06/26/22 203 lb 14.4 oz (92.5 kg)  06/20/22 202 lb 12.8 oz (92 kg)  05/29/22 205 lb 11.2 oz (93.3 kg)     GENERAL:alert, no distress and comfortable SKIN: skin color normal, no rashes or significant lesions EYES: normal, Conjunctiva are pink and non-injected, sclera clear  NEURO: alert & oriented x 3 with fluent speech  LABORATORY DATA:  I have reviewed the data as listed    Latest Ref Rng & Units 06/26/2022   10:48 AM 05/29/2022   10:05 AM 05/01/2022    9:06 AM  CBC  WBC 4.0 - 10.5 K/uL 5.4  7.1  4.5   Hemoglobin 13.0 - 17.0 g/dL 12.4  12.8  11.2   Hematocrit 39.0 - 52.0 % 37.7  38.5  34.9   Platelets 150 - 400 K/uL 129  159  106         Latest Ref Rng & Units 06/26/2022   10:48 AM 05/29/2022   10:05 AM 05/01/2022    9:06 AM  CMP  Glucose 70 - 99 mg/dL 161  209  106   BUN 8 - 23 mg/dL _0 Creatinine 0.61 - 1.24 mg/dL 1.39  1.31  1.35   Sodium 135 - 145 mmol/L 138  136  138   Potassium 3.5 - 5.1 mmol/L 4.0  3.7  4.2    Chloride 98 - 111 mmol/L 109  107  108   CO2 22 - 32 mmol/L _1 Calcium 8.9 - 10.3 mg/dL 8.8  9.0  9.7   Total Protein 6.5 - 8.1 g/dL 7.0  7.2  7.0   Total Bilirubin 0.3 - 1.2 mg/dL 0.5  0.6  0.4   Alkaline Phos 38 - 126 U/L 58  70  46   AST 15 - 41 U/L 33  31  20   ALT 0 - 44 U/L _2 RADIOGRAPHIC STUDIES: I have personally reviewed the radiological images as listed and agreed with the findings in the report. No results found.    Orders Placed This Encounter  Procedures   MR Abdomen W Wo Contrast    Standing Status:   Future    Standing Expiration Date:   06/27/2023    Order Specific Question:   If indicated for the ordered procedure, I authorize the administration of contrast media per Radiology protocol    Answer:   Yes    Order Specific Question:   What is the patient's sedation requirement?    Answer:   No Sedation    Order Specific Question:   Does the patient have a pacemaker or implanted devices?    Answer:   No    Order Specific Question:   Preferred imaging location?    Answer:   [facility] (table limit - 550 lbs)   CT Chest Wo Contrast    Standing Status:   Future    Standing Expiration Date:   06/26/2023    Order Specific Question:   Preferred imaging location?    Answer:   [facility]   All questions were answered. The patient knows to call the clinic with any problems, questions or concerns. No barriers to learning was detected. The total time spent in the appointment was 30 minutes.     [name], MD 06/26/2022  I, Wilburn Mylar, am acting as scribe for [name], MD.   I have reviewed the above documentation for accuracy and completeness, and I agree with the above.

## 2022-06-27 ENCOUNTER — Encounter: Payer: Self-pay | Admitting: Gastroenterology

## 2022-07-05 ENCOUNTER — Other Ambulatory Visit (HOSPITAL_COMMUNITY): Payer: Self-pay

## 2022-07-12 DIAGNOSIS — E785 Hyperlipidemia, unspecified: Secondary | ICD-10-CM | POA: Diagnosis not present

## 2022-07-12 DIAGNOSIS — I4811 Longstanding persistent atrial fibrillation: Secondary | ICD-10-CM | POA: Diagnosis not present

## 2022-07-12 LAB — LIPID PANEL
Chol/HDL Ratio: 4 ratio (ref 0.0–5.0)
Cholesterol, Total: 117 mg/dL (ref 100–199)
HDL: 29 mg/dL — ABNORMAL LOW (ref >39)
LDL Chol Calc (NIH): 66 mg/dL (ref 0–99)
Triglycerides: 122 mg/dL (ref 0–149)
VLDL Cholesterol Cal: 22 mg/dL (ref 5–40)

## 2022-07-12 LAB — HEPATIC FUNCTION PANEL
ALT: 37 IU/L (ref 0–44)
AST: 47 IU/L — ABNORMAL HIGH (ref 0–40)
Albumin: 3.9 g/dL (ref 3.8–4.8)
Alkaline Phosphatase: 72 IU/L (ref 44–121)
Bilirubin Total: 0.6 mg/dL (ref 0.0–1.2)
Bilirubin, Direct: 0.27 mg/dL (ref 0.00–0.40)
Total Protein: 6.6 g/dL (ref 6.0–8.5)

## 2022-07-18 ENCOUNTER — Ambulatory Visit
Payer: Medicare HMO | Attending: Cardiovascular Disease | Admitting: Pharmacist Clinician (PhC)/ Clinical Pharmacy Specialist

## 2022-07-18 DIAGNOSIS — I10 Essential (primary) hypertension: Secondary | ICD-10-CM

## 2022-07-18 MED ORDER — OLMESARTAN MEDOXOMIL 20 MG PO TABS
20.0000 mg | ORAL_TABLET | Freq: Every day | ORAL | 3 refills | Status: DC
Start: 2022-07-18 — End: 2022-08-29

## 2022-07-18 NOTE — Assessment & Plan Note (Signed)
Patient with essential hypertension, only on hydralazine and metoprolol.  Home BP averaging 156/83.  Will have him start olmesartan 20 mg once daily.  He will need to have metabolic panel drawn about 2 weeks after starting this.  Because he gets regular lab draws at the cancer center, will just monitor those labs for renal function and electrolytes.  Will see him back in 6 weeks for follow up.  He was asked to continue with regular home monitoring and bring log as well as home meter to next appointment.

## 2022-07-18 NOTE — Progress Notes (Signed)
07/18/2022 Brandon Sherman [birth year] [id number]   HPI:  Brandon Sherman is a 76 y.o. male patient of Dr Gwenlyn Found, with a [place] below who presents today for hypertension clinic evaluation.  He was seen by Dr. Gwenlyn Found earlier this month and found to have a BP of 168/88.  Hydralazine was increased from 50 mg tid to 75 mg tid.  Today he returns for a follow up visit.  Has been doing well for the past month, no concerns about his medication and no side effects.    Past Medical History: AF Found when Port-A-Cath placed, put on Eliquis  hyperlipidemia 8/23 LDL 66 - on atorvastatin 40 mg  CAD Coronary calcifications seen on CT  cancer Liver - on regorafenib 80 mg qd x 3 wks, off x 1 wk    Blood Pressure Goal:  130/80  Current Medications:   hydralazine 75 mg tid, metoprolol succ 50 mg qd  Family Hx:   father died at 32 from stroke, mother at 81 MI; siblings all with heart disease; 1 daughter with CABG at 42  Social Hx:  no tobacco, quit alcohol with cancer 3 years ago; decaf coffee, diet coke most day  Diet:  eat out once weekly; good mix of protein, grows garden - eating off that all summer;   Exercise: regular yard work, gardening  Home BP readings:    home arm cuff about 74 years old, has readings from last 9 days -   Average 156/83 HR 53 (range 142-173/73-92)  Intolerances:  nkda  Labs: 06/26/22:  Na 138, K 4.0, Glu 161, BUN 15, SCr 1.39, GFR 53   Wt Readings from Last 3 Encounters:  07/18/22 203 lb 14.4 oz (92.5 kg)  06/26/22 203 lb 14.4 oz (92.5 kg)  06/20/22 202 lb 12.8 oz (92 kg)   BP Readings from Last 3 Encounters:  07/18/22 (!) 168/77  06/26/22 (!) 159/73  06/20/22 (!) 168/88   Pulse Readings from Last 3 Encounters:  07/18/22 (!) 52  06/26/22 65  06/20/22 72    Current Outpatient Medications  Medication Sig Dispense Refill   olmesartan (BENICAR) 20 MG tablet Take 1 tablet (20 mg total) by mouth daily. 90 tablet 3   allopurinol (ZYLOPRIM) 100 MG tablet Take  100 mg by mouth daily.     atorvastatin (LIPITOR) 40 MG tablet Take 1 tablet (40 mg total) by mouth daily. 90 tablet 3   colchicine 0.6 MG tablet Take 1 tablet (0.6 mg total) by mouth daily as needed (gout flare). Take daily for 3 days, then as needed for gout flare 30 tablet 0   ELIQUIS 5 MG TABS tablet TAKE 1 TABLET TWICE DAILY 180 tablet 1   fenofibrate (TRICOR) 145 MG tablet Take 145 mg by mouth daily.     finasteride (PROSCAR) 5 MG tablet Take 5 mg by mouth daily.     furosemide (LASIX) 40 MG tablet TAKE 2 TABLETS (80 MG TOTAL) BY MOUTH 2 (TWO) TIMES DAILY. 360 tablet 3   gabapentin (NEURONTIN) 100 MG capsule Take 100 mg by mouth at bedtime.     hydrALAZINE (APRESOLINE) 50 MG tablet Take 1.5 tablets (75 mg total) by mouth 3 (three) times daily. 135 tablet 3   KLOR-CON M20 20 MEQ tablet TAKE 1 TABLET BY MOUTH TWICE A DAY 180 tablet 3   lidocaine-prilocaine (EMLA) cream Apply 1 application topically as needed. 30 g 3   magnesium oxide (MAG-OX) 400 (240 Mg) MG tablet TAKE 1  TABLET BY MOUTH EVERY DAY 30 tablet 1   metFORMIN (GLUCOPHAGE-XR) 500 MG 24 hr tablet      metoprolol succinate (TOPROL-XL) 50 MG 24 hr tablet Take 1 tablet (50 mg total) by mouth daily. Take with or immediately following a meal. 90 tablet 3   pantoprazole (PROTONIX) 40 MG tablet TAKE 1 TABLET BY MOUTH TWICE A DAY 180 tablet 2   regorafenib (STIVARGA) 40 MG tablet Take 2 tablets (80 mg total) by mouth daily. Take for 3 weeks then off for one week. 42 tablet 1   sucralfate (CARAFATE) 1 g tablet Take 1 tablet (1 g total) by mouth every 6 (six) hours as needed. Please schedule a yearly follow up for further refills: [phone] 60 tablet 0   tamsulosin (FLOMAX) 0.4 MG CAPS capsule Take 0.4 mg by mouth daily.     No current facility-administered medications for this visit.    No Known Allergies  Past Medical History:  Diagnosis Date   Arthritis    Diabetes (HCC)    GERD (gastroesophageal reflux disease)     Hyperlipidemia    Hypertension    Intrahepatic cholangiocarcinoma (HCC)     Blood pressure (!) 168/77, pulse (!) 52, height '5\' 9"'$  (1.753 m), weight 203 lb 14.4 oz (92.5 kg).  HYPERTENSION CONTROL Vitals:   07/18/22 0959 07/18/22 1005  BP: (!) 173/103 (!) 168/77    The patient's blood pressure is elevated above target today.  In order to address the patient's elevated BP: A new medication was prescribed today.      Essential hypertension Patient with essential hypertension, only on hydralazine and metoprolol.  Home BP averaging 156/83.  Will have him start olmesartan 20 mg once daily.  He will need to have metabolic panel drawn about 2 weeks after starting this.  Because he gets regular lab draws at the cancer center, will just monitor those labs for renal function and electrolytes.  Will see him back in 6 weeks for follow up.  He was asked to continue with regular home monitoring and bring log as well as home meter to next appointment.    Tommy Medal PharmD CPP [facility] [address] [place], [place] [address] [phone]

## 2022-07-18 NOTE — Patient Instructions (Signed)
Return for a a follow up appointment October 10 at 9 am  Go to the lab at your next [facility] appointment - we will look at the labs they draw  Check your blood pressure at home daily and keep record of the readings.  Take your BP meds as follows:  Start Olmesartan 20 mg once daily (when it arrives)  Continue with all other medications  Bring all of your meds, your BP cuff and your record of home blood pressures to your next appointment.  Exercise as you're able, try to walk approximately 30 minutes per day.  Keep salt intake to a minimum, especially watch canned and prepared boxed foods.  Eat more fresh fruits and vegetables and fewer canned items.  Avoid eating in fast food restaurants.    HOW TO TAKE YOUR BLOOD PRESSURE: Rest 5 minutes before taking your blood pressure.  Don't smoke or drink caffeinated beverages for at least 30 minutes before. Take your blood pressure before (not after) you eat. Sit comfortably with your back supported and both feet on the floor (don't cross your legs). Elevate your arm to heart level on a table or a desk. Use the proper sized cuff. It should fit smoothly and snugly around your bare upper arm. There should be enough room to slip a fingertip under the cuff. The bottom edge of the cuff should be 1 inch above the crease of the elbow. Ideally, take 3 measurements at one sitting and record the average.

## 2022-07-19 ENCOUNTER — Other Ambulatory Visit (HOSPITAL_COMMUNITY): Payer: Self-pay

## 2022-08-01 ENCOUNTER — Other Ambulatory Visit (HOSPITAL_COMMUNITY): Payer: Self-pay

## 2022-08-09 ENCOUNTER — Ambulatory Visit (HOSPITAL_COMMUNITY)
Admission: RE | Admit: 2022-08-09 | Discharge: 2022-08-09 | Disposition: A | Discharge status: Home / Self Care | Payer: Medicare HMO | Source: Ambulatory Visit | Attending: Hematology | Admitting: Hematology

## 2022-08-09 DIAGNOSIS — R918 Other nonspecific abnormal finding of lung field: Secondary | ICD-10-CM | POA: Diagnosis not present

## 2022-08-09 DIAGNOSIS — C221 Intrahepatic bile duct carcinoma: Secondary | ICD-10-CM | POA: Insufficient documentation

## 2022-08-09 DIAGNOSIS — R911 Solitary pulmonary nodule: Secondary | ICD-10-CM | POA: Diagnosis not present

## 2022-08-09 DIAGNOSIS — K6389 Other specified diseases of intestine: Secondary | ICD-10-CM | POA: Diagnosis not present

## 2022-08-09 DIAGNOSIS — K769 Liver disease, unspecified: Secondary | ICD-10-CM | POA: Diagnosis not present

## 2022-08-09 DIAGNOSIS — I81 Portal vein thrombosis: Secondary | ICD-10-CM | POA: Diagnosis not present

## 2022-08-09 MED ORDER — GADOBUTROL 1 MMOL/ML IV SOLN
7.5000 mL | Freq: Once | INTRAVENOUS | Status: AC | PRN
  Administered 2022-08-09: 7.5 mL via INTRAVENOUS

## 2022-08-11 ENCOUNTER — Other Ambulatory Visit (HOSPITAL_COMMUNITY): Payer: Self-pay

## 2022-08-14 ENCOUNTER — Encounter: Payer: Self-pay | Admitting: Hematology

## 2022-08-14 ENCOUNTER — Inpatient Hospital Stay: Payer: Medicare HMO | Attending: Hematology

## 2022-08-14 ENCOUNTER — Other Ambulatory Visit: Payer: Self-pay

## 2022-08-14 ENCOUNTER — Inpatient Hospital Stay: Payer: Medicare HMO | Admitting: Hematology

## 2022-08-14 VITALS — BP 131/59 | HR 60 | Temp 98.0°F | Resp 18 | Ht 69.0 in | Wt 202.8 lb

## 2022-08-14 DIAGNOSIS — C221 Intrahepatic bile duct carcinoma: Secondary | ICD-10-CM | POA: Insufficient documentation

## 2022-08-14 DIAGNOSIS — G629 Polyneuropathy, unspecified: Secondary | ICD-10-CM | POA: Insufficient documentation

## 2022-08-14 DIAGNOSIS — R911 Solitary pulmonary nodule: Secondary | ICD-10-CM | POA: Insufficient documentation

## 2022-08-14 DIAGNOSIS — Z79899 Other long term (current) drug therapy: Secondary | ICD-10-CM | POA: Insufficient documentation

## 2022-08-14 DIAGNOSIS — Z95828 Presence of other vascular implants and grafts: Secondary | ICD-10-CM

## 2022-08-14 LAB — CBC WITH DIFFERENTIAL/PLATELET
Abs Immature Granulocytes: 0.01 10*3/uL (ref 0.00–0.07)
Basophils Absolute: 0 10*3/uL (ref 0.0–0.1)
Basophils Relative: 1 %
Eosinophils Absolute: 0.1 10*3/uL (ref 0.0–0.5)
Eosinophils Relative: 2 %
HCT: 36.5 % — ABNORMAL LOW (ref 39.0–52.0)
Hemoglobin: 12 g/dL — ABNORMAL LOW (ref 13.0–17.0)
Immature Granulocytes: 0 %
Lymphocytes Relative: 17 %
Lymphs Abs: 0.8 10*3/uL (ref 0.7–4.0)
MCH: 29.3 pg (ref 26.0–34.0)
MCHC: 32.9 g/dL (ref 30.0–36.0)
MCV: 89 fL (ref 80.0–100.0)
Monocytes Absolute: 0.5 10*3/uL (ref 0.1–1.0)
Monocytes Relative: 9 %
Neutro Abs: 3.5 10*3/uL (ref 1.7–7.7)
Neutrophils Relative %: 71 %
Platelets: 106 10*3/uL — ABNORMAL LOW (ref 150–400)
RBC: 4.1 MIL/uL — ABNORMAL LOW (ref 4.22–5.81)
RDW: 17.3 % — ABNORMAL HIGH (ref 11.5–15.5)
WBC: 4.9 10*3/uL (ref 4.0–10.5)
nRBC: 0 % (ref 0.0–0.2)

## 2022-08-14 LAB — COMPREHENSIVE METABOLIC PANEL
ALT: 33 U/L (ref 0–44)
AST: 39 U/L (ref 15–41)
Albumin: 3.6 g/dL (ref 3.5–5.0)
Alkaline Phosphatase: 56 U/L (ref 38–126)
Anion gap: 4 — ABNORMAL LOW (ref 5–15)
BUN: 15 mg/dL (ref 8–23)
CO2: 26 mmol/L (ref 22–32)
Calcium: 8.8 mg/dL — ABNORMAL LOW (ref 8.9–10.3)
Chloride: 107 mmol/L (ref 98–111)
Creatinine, Ser: 1.25 mg/dL — ABNORMAL HIGH (ref 0.61–1.24)
GFR, Estimated: >60 mL/min (ref >60)
Glucose, Bld: 160 mg/dL — ABNORMAL HIGH (ref 70–99)
Potassium: 3.4 mmol/L — ABNORMAL LOW (ref 3.5–5.1)
Sodium: 137 mmol/L (ref 135–145)
Total Bilirubin: 0.7 mg/dL (ref 0.3–1.2)
Total Protein: 6.8 g/dL (ref 6.5–8.1)

## 2022-08-14 MED ORDER — HEPARIN SOD (PORK) LOCK FLUSH 100 UNIT/ML IV SOLN
500.0000 [IU] | Freq: Once | INTRAVENOUS | Status: AC
  Administered 2022-08-14: 500 [IU]

## 2022-08-14 MED ORDER — REGORAFENIB 40 MG PO TABS: 80.0000 mg | ORAL_TABLET | Freq: Every day | ORAL | 2 refills | Status: DC

## 2022-08-14 MED ORDER — ONDANSETRON HCL 8 MG PO TABS: 8.0000 mg | ORAL_TABLET | Freq: Three times a day (TID) | ORAL | 1 refills | Status: DC | PRN

## 2022-08-14 MED ORDER — SODIUM CHLORIDE 0.9% FLUSH
10.0000 mL | Freq: Once | INTRAVENOUS | Status: AC
  Administered 2022-08-14: 10 mL

## 2022-08-14 NOTE — Progress Notes (Signed)
[place]   [phone] [phone]   Clinic Follow up Note   Patient Care Team: Marga Hoots, NP as PCP - General (Adult Health Nurse Practitioner) Lorretta Harp, MD as PCP - Cardiology (Cardiology) [name], MD as Consulting Physician (General Surgery) [name], [name], MD as Consulting Physician (Gastroenterology) [name], MD as Consulting Physician (Hematology) Lorretta Harp, MD as Consulting Physician (Cardiology)  Date of Service:  08/14/2022  CHIEF COMPLAINT: f/u of cholangiocarcinoma  CURRENT THERAPY:  Stivarga, q28d, starting 05/15/22 -dose: 80 mg daily d1-21  ASSESSMENT & PLAN:  Brandon Sherman is a 76 y.o. male with   1. Intrahepatic cholangiocarcinoma, cT2N0Mx, unresectable, with indeterminate lung nodules, IDH mutation (+) -Diagnosed in 07/2019. CT scans and MRI liver show a large 7.3cm mass in the right hepatic lobe which abuts portal vein. Both our local surgeon Dr. Barry Dienes and Dr Carlis Abbott at [facility] concluded that cancer is not resectable, and therefore not likely curable, due to the invasion to portal vein. -He received palliative systemic treatment, standard first line chemo with IV Cisplatin and Gemcitabine 2 weeks on/1 week off, 09/29/19-07/19/20. Cisplatin discontinued 03/23/20 due to Afib. -His FO results showed MSI stable disease, IDH1 mutations he may benefit from IDH inhibitor  -he progressed through single agent gemcitabine (12/24/20-03/16/21), FOLFOX plus durvalumab (04/05/21-10/21/21), FOLFIRI (11/01/21-01/19/22), and then tried ivosidenib (02/02/22 - 04/2022) -due to disease progression, we switched to [name] on 05/15/22. He tolerated low-dose 80 mg daily well, but developed multiple side effects on 120 mg daily. He is tolerating $RemoveBefor'80mg'[id number]$  for 3 weeks on, 1 week off with no noticeable side effects. -restaging abdomen MRI and chest CT on 08/09/22 showed: possible further extension of primary tumor along right hepatic  margin; increase in size of smaller liver lesion; no new lesions; increase in abdominal ascites; overall stable lung nodules. Overall, I feel this is stable to mildly progressed disease, plan to continue stirvaga, given limited treatment options left. -labs reviewed, overall stable, hgb 12, plt 106k, K 3.4, cr 1.25, ca 8.8. Will continue stirvaga at same dose given his good tolerance.   2. 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   No problem-specific Assessment & Plan notes found for this encounter.   SUMMARY OF ONCOLOGIC HISTORY: Oncology History Overview Note  Cancer Staging Intrahepatic cholangiocarcinoma ([place]) Staging form: Intrahepatic Bile Duct, AJCC 8th Edition - Clinical stage from 08/19/2019: Stage II (cT2, cN0, cM0) - Signed by [name], MD on 08/19/2019    Intrahepatic cholangiocarcinoma ([place])  06/28/2019 Imaging   CT AP W Contrast 06/28/19  IMPRESSION: 1. Heterogeneous hypodensity posteriorly in the right hepatic lobe and potentially extending into the caudate lobe suspicious for a mass. There is felt to be truncation of branches of the portal vein in this vicinity and some narrowing of the hepatic vein, as well as triangular-shaped regions of abnormal hypoenhancement posteriorly in the right hepatic lobe likely representing downstream vascular effects. Cannot exclude malignancy such as cholangiocarcinoma or hepatocellular carcinoma, and follow up hepatic protocol MRI with and without contrast is recommended to further characterize. 2. 4 mm right middle lobe pulmonary nodule is likely benign but may merit surveillance. 3. Cholelithiasis. 4.  Aortic Atherosclerosis (ICD10-I70.0). 5. Prostatomegaly. 6. Mild impingement at L3-4 and L4-5.  07/31/2019 Imaging   MRI Liver  07/31/19 IMPRESSION: 1. 7.3 cm in long axis mass in the right hepatic lobe spanning into the caudate lobe, high suspicion for malignancy such as hepatocellular carcinoma or cholangiocarcinoma. Suspected effacement or occlusion of the right hepatic vein and posterior branches of the right portal vein. Two smaller tumor nodules along the posterior periphery of the dominant mass. Tissue diagnosis is recommended. 2. No findings of pathologic adenopathy or distant metastatic spread. 3. 9 mm gallstone in the gallbladder. There is mild gallbladder wall thickening which may be from nondistention, correlate clinically in assessing for cholecystitis. 4.  Aortic Atherosclerosis (ICD10-I70.0). 5. Mild diffuse hepatic steatosis.   08/11/2019 Initial Biopsy   DIAGNOSIS: 08/11/19  A. LIVER, RIGHT, BIOPSY:  - Adenocarcinoma.   08/18/2019 Imaging   CT Chest 08/18/19  IMPRESSION: 1. Multiple pulmonary nodules largest at approximately 7 mm in the right lower lobe, nonspecific but concerning given findings in the liver. 2. No signs of definitive metastatic disease, also with mildly enlarged upper abdominal lymph nodes as discussed.   Aortic Atherosclerosis (ICD10-I70.0).   08/19/2019 Initial Diagnosis   Intrahepatic cholangiocarcinoma ([place])   08/19/2019 Cancer Staging   Staging form: Intrahepatic Bile Duct, AJCC 8th Edition - Clinical stage from 08/19/2019: Stage II (cT2, cN0, cM0) - Signed by [name], MD on 08/19/2019   09/29/2019 - 07/19/2020 Chemotherapy   Cisplatin and Gemcitabine 2 weeks on/1 week off starting 09/29/19. Cisplatin held from cycle 9 (03/23/20) due to fluid status/Afib. He is now on maintenance Gemcitabine. Held after 07/19/20 to proceed with liver target therapy.     10/09/2019 Imaging   CT AP IMPRESSION: 1. The dominant right hepatic lobe mass is minimally reduced in size compared to prior exams, currently measuring 6.2 by 5.3 cm, previously 6.2 by 5.6 cm. However, there is a new  small hypodense lesion centrally in the right hepatic lobe which is suspicious for a new small focus of tumor. Accordingly this is an overall mixed appearance. 2. Continued hypoenhancement in the liver downstream of the tumor likely attributable to narrowing or occlusion of the right hepatic vein by the tumor. By virtue of its location the tumor wraps around the intrahepatic portion of the IVC. 3. 4 mm right middle lobe pulmonary nodule, stable compared to earliest available comparison of 07/18/2019. Surveillance of the patient's pulmonary nodules is recommended. 4. Other imaging findings of potential clinical significance: Coronary atherosclerosis. Cholelithiasis. Prominent stool throughout the colon favors constipation. Moderate prostatomegaly with heterogeneous enhancement of the prostate gland. Lumbar spondylosis and degenerative disc disease causing mild bilateral foraminal impingement at L3-4 and L4-5.   Aortic Atherosclerosis (ICD10-I70.0).   12/24/2019 Imaging   CT CAP W Contrast  IMPRESSION: 1. Right hepatic lobe mass and adjacent right hepatic lobe nodules appear grossly stable. No evidence of distant metastatic disease. 2. Continued stability of small pulmonary nodules. Recommend attention on follow-up. 3. Cholelithiasis. 4. Enlarged prostate. 5. Aortic atherosclerosis (ICD10-I70.0). Coronary artery calcification.   02/23/2020 Procedure   He had PAC placed on 02/23/20.    04/08/2020 Imaging   CT CAP  IMPRESSION: 1. Stable or minimally decreased size of a very ill-defined, hypodense and somewhat retractile appearing mass of the central right lobe of the liver abutting the inferior vena cava and right portal vein, measuring approximately 5.7 x 5.0 cm, previously 6.2 x 5.2 cm when measured similarly. Findings are consistent with stable or minimally improved cholangiocarcinoma. 2. Small hypodense nodules of the right lobe identified on prior examination are poorly appreciated  on  this single phase contrast examination although not grossly changed. Attention on follow-up. 3. There are new, moderate bilateral pleural effusions and associated atelectasis or consolidation as well as a new small pericardial effusion, nonspecific although generally concerning and suspicious for malignant effusions. There is no directly visualized pleural nodularity. 4. Multiple small pulmonary nodules are stable.  No new nodules. 5. Coronary artery disease. Aortic Atherosclerosis (ICD10-I70.0).   07/06/2020 Imaging   MRI ABD IMPRESSION: 1. Interval decrease in size of the right hepatic lobe lesion in the medial aspect of the segment 7. Findings would suggest a good response to treatment with some contraction of the tumor. 2. No new hepatic lesions. No abdominal adenopathy or metastatic disease. 3. Stable mild intrahepatic biliary dilatation in the right hepatic lobe distal to the lesion. 4. Somewhat tortuous and almost beaded appearance of the hepatic and portal vein radicles. Findings could be radiation related. 5. Cholelithiasis.  CT chest wo contrast IMPRESSION: 1. Persistent/stable moderate-sized bilateral pleural effusions with overlying atelectasis. 2. Stable small right pulmonary nodules. No new or progressive findings. Recommend continued surveillance. 3. No mediastinal or hilar mass or adenopathy. 4. Stable advanced three-vessel coronary artery calcifications. 5. Cholelithiasis. Aortic Atherosclerosis (ICD10-I70.0)   08/11/2020 Procedure   Y90 on 08/11/20 and 08/26/20 with Dr Laurence Ferrari    11/30/2020 Imaging   MRI Abdomen  IMPRESSION: 1. Today's study demonstrates progression of disease with enlarging central lesion in the right lobe of the liver involving portions of segments 7 and 8, now with evidence of some tumor thrombus extension into the intrahepatic portion of the inferior vena cava. This is associated with increasing intrahepatic biliary ductal dilatation in  segment 7 of the liver. 2. In addition, there is some subtle hyperenhancement of the distal common bile duct at and immediately proximally to the level of the ampulla. There is also some subtle delayed enhancement around this region in the pancreatic head. This is of uncertain etiology and significance, but may be inflammatory and currently is not associated with proximal common bile duct dilatation. However, close attention on follow-up imaging is recommended. 3. Cholelithiasis without evidence of acute cholecystitis at this time. 4. Aortic atherosclerosis.     12/24/2020 - 03/21/2021 Chemotherapy   Restart Gemcitabine 2 weeks on/1 week off given disease progression beginning 12/24/20. Discontinued after 03/21/21 due to disease progression in liver.     03/16/2021 Imaging   MRI abdomen  IMPRESSION: No significant change in size of irregular hypovascular mass in the medial right hepatic lobe.   New 1.1 cm hypovascular lesion with peripheral rim enhancement in liver dome, suspicious for metastatic disease.   New small right pleural effusion and mild ascites.   Cholelithiasis, without evidence of cholecystitis or biliary dilatation.   04/05/2021 -  Chemotherapy   Second-line FOLFOX q2weeks starting 04/05/21    04/05/2021 -  Chemotherapy   Immunotherapy with Durvalumab (Imfinzi) q4weeks starting 04/05/21. (in addition to second line FOLFOX)   06/23/2021 Imaging   MRI Abdomen IMPRESSION: 1. No significant change in subcapsular mass of the posterior right lobe of the liver. 2. No significant change in an additional small hyperenhancing lesion of the liver dome, which remains suspicious for a satellite lesion. 3. No new liver lesions. 4. 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 5. Cholelithiasis without evidence of acute cholecystitis. 6. Trace ascites.  CT Chest w/o contrast IMPRESSION: 1. Occasional small pulmonary nodules in the right lung are stable, for example a  6 mm nodule right lower lobe and a 3  mm nodule of the lateral segment right middle lobe. These are nonspecific and likely benign, incidental sequelae of infection or inflammation, metastatic disease not favored. Attention on follow-up. 2. Previously seen bilateral pleural effusions are resolved. 3. Hypodense lesion of the posterior right lobe of the liver, keeping with known cholangiocarcinoma and better characterized by same day MR. 4. Coronary artery disease.   10/21/2021 Imaging   EXAM: MRI ABDOMEN WITHOUT AND WITH CONTRAST  IMPRESSION: 1. Mild progression of dominant right hepatic lobe mass with involvement of the right portal, hepatic veins and minimal extension into the IVC. 2. Hepatic dome satellite lesion, new or more conspicuous today. 3. Cholelithiasis 4. Decrease in trace perihepatic and perisplenic ascites. 5.  Aortic Atherosclerosis (ICD10-I70.0).   10/21/2021 Imaging   EXAM: CT CHEST WITHOUT CONTRAST  IMPRESSION: 1. Since 06/23/2021, enlargement of a right lower lobe and possible enlargement of a right middle lobe pulmonary nodule. Findings are suspicious for metastatic disease. 2. No thoracic adenopathy. 3. Coronary artery atherosclerosis. Aortic Atherosclerosis (ICD10-I70.0).   11/01/2021 - 01/11/2022 Chemotherapy   Patient is on Treatment Plan : PANCREAS FOLFIRI q14d     01/19/2022 Imaging   EXAM: CT CHEST WITHOUT CONTRAST  IMPRESSION: 1. Continued increase in size of lung nodule within the posterior right lung base which now measures 1 cm and is suspicious for metastatic disease. The remaining lung nodules are stable from 10/21/2021. 2. No thoracic adenopathy. 3. Aortic Atherosclerosis (ICD10-I70.0). Coronary artery calcifications.   01/19/2022 Imaging   EXAM: MRI ABDOMEN WITHOUT AND WITH CONTRAST  IMPRESSION: 1. No significant change in size or appearance of the large heterogeneous enhancing mass area in the right hepatic lobe as described. There is  mild interval enlargement of the adjacent satellite lesion near the dome of the right lobe now measuring 1.9 cm. 2. Redemonstration of a pulmonary nodule in the right lower lobe measuring 9 mm. 3. Small volume ascites. 4. Splenomegaly. 5. Cholelithiasis.      INTERVAL HISTORY:  Brandon Sherman is here for a follow up of cholangiocarcinoma. He was last seen by me on 06/26/22. He presents to the clinic accompanied by his wife. He reports he is feeling well overall. He denies loss of appetite, pain, or any other concerns.   All other systems were reviewed with the patient and are negative.  MEDICAL HISTORY:  Past Medical History:  Diagnosis Date   Arthritis    Diabetes ([place])    GERD (gastroesophageal reflux disease)    Hyperlipidemia    Hypertension    Intrahepatic cholangiocarcinoma ([place])     SURGICAL HISTORY: Past Surgical History:  Procedure Laterality Date   APPENDECTOMY  1980   CARDIOVERSION N/A 04/27/2020   Procedure: CARDIOVERSION;  Surgeon: Dorothy Spark, MD;  Location: [place];  Service: Cardiovascular;  Laterality: N/A;   CARDIOVERSION N/A 05/19/2020   Procedure: CARDIOVERSION;  Surgeon: Elouise Munroe, MD;  Location: [facility];  Service: Cardiovascular;  Laterality: N/A;   COLONOSCOPY     IR 3D INDEPENDENT WKST  08/11/2020   IR 3D INDEPENDENT WKST  08/11/2020   IR ANGIOGRAM SELECTIVE EACH ADDITIONAL VESSEL  08/11/2020   IR ANGIOGRAM SELECTIVE EACH ADDITIONAL VESSEL  08/11/2020   IR ANGIOGRAM SELECTIVE EACH ADDITIONAL VESSEL  08/11/2020   IR ANGIOGRAM SELECTIVE EACH ADDITIONAL VESSEL  08/11/2020   IR ANGIOGRAM SELECTIVE EACH ADDITIONAL VESSEL  08/11/2020   IR ANGIOGRAM SELECTIVE EACH ADDITIONAL VESSEL  08/11/2020   IR ANGIOGRAM SELECTIVE EACH ADDITIONAL VESSEL  08/26/2020   IR ANGIOGRAM SELECTIVE  EACH ADDITIONAL VESSEL  08/26/2020   IR ANGIOGRAM VISCERAL SELECTIVE  08/11/2020   IR ANGIOGRAM VISCERAL SELECTIVE  08/26/2020   IR EMBO ARTERIAL NOT HEMORR HEMANG  INC GUIDE ROADMAPPING  08/11/2020   IR EMBO TUMOR ORGAN ISCHEMIA INFARCT INC GUIDE ROADMAPPING  08/26/2020   IR IMAGING GUIDED PORT INSERTION  02/23/2020   IR RADIOLOGIST EVAL & MGMT  07/14/2020   IR RADIOLOGIST EVAL & MGMT  09/30/2020   IR RADIOLOGIST EVAL & MGMT  12/01/2020   IR US GUIDE VASC ACCESS LEFT  08/26/2020   IR US GUIDE VASC ACCESS RIGHT  08/11/2020    I have reviewed the social history and family history with the patient and they are unchanged from previous note.  ALLERGIES:  has No Known Allergies.  MEDICATIONS:  Current Outpatient Medications  Medication Sig Dispense Refill   allopurinol (ZYLOPRIM) 100 MG tablet Take 100 mg by mouth daily.     atorvastatin (LIPITOR) 40 MG tablet Take 1 tablet (40 mg total) by mouth daily. 90 tablet 3   colchicine 0.6 MG tablet Take 1 tablet (0.6 mg total) by mouth daily as needed (gout flare). Take daily for 3 days, then as needed for gout flare 30 tablet 0   ELIQUIS 5 MG TABS tablet TAKE 1 TABLET TWICE DAILY 180 tablet 1   fenofibrate (TRICOR) 145 MG tablet Take 145 mg by mouth daily.     finasteride (PROSCAR) 5 MG tablet Take 5 mg by mouth daily.     furosemide (LASIX) 40 MG tablet TAKE 2 TABLETS (80 MG TOTAL) BY MOUTH 2 (TWO) TIMES DAILY. 360 tablet 3   gabapentin (NEURONTIN) 100 MG capsule Take 100 mg by mouth at bedtime.     hydrALAZINE (APRESOLINE) 50 MG tablet Take 1.5 tablets (75 mg total) by mouth 3 (three) times daily. 135 tablet 3   KLOR-CON M20 20 MEQ tablet TAKE 1 TABLET BY MOUTH TWICE A DAY 180 tablet 3   lidocaine-prilocaine (EMLA) cream Apply 1 application topically as needed. 30 g 3   magnesium oxide (MAG-OX) 400 (240 Mg) MG tablet TAKE 1 TABLET BY MOUTH EVERY DAY 30 tablet 1   metFORMIN (GLUCOPHAGE-XR) 500 MG 24 hr tablet      metoprolol succinate (TOPROL-XL) 50 MG 24 hr tablet Take 1 tablet (50 mg total) by mouth daily. Take with or immediately following a meal. 90 tablet 3   olmesartan (BENICAR) 20 MG tablet Take 1 tablet (20  mg total) by mouth daily. 90 tablet 3   ondansetron (ZOFRAN) 8 MG tablet Take 1 tablet (8 mg total) by mouth every 8 (eight) hours as needed for nausea or vomiting. 30 tablet 1   pantoprazole (PROTONIX) 40 MG tablet TAKE 1 TABLET BY MOUTH TWICE A DAY 180 tablet 2   regorafenib (STIVARGA) 40 MG tablet Take 2 tablets (80 mg total) by mouth daily. Take for 3 weeks then off for one week. 42 tablet 2   sucralfate (CARAFATE) 1 g tablet Take 1 tablet (1 g total) by mouth every 6 (six) hours as needed. Please schedule a yearly follow up for further refills: [id number] 60 tablet 0   tamsulosin (FLOMAX) 0.4 MG CAPS capsule Take 0.4 mg by mouth daily.     No current facility-administered medications for this visit.    PHYSICAL EXAMINATION: ECOG PERFORMANCE STATUS: 1 - Symptomatic but completely ambulatory  Vitals:   08/14/22 1128  BP: (!) 131/59  Pulse: 60  Resp: 18  Temp: 98 F (36.7 C)  SpO2:  96%   Wt Readings from Last 3 Encounters:  08/14/22 202 lb 12.8 oz (92 kg)  07/18/22 203 lb 14.4 oz (92.5 kg)  06/26/22 203 lb 14.4 oz (92.5 kg)     GENERAL:alert, no distress and comfortable SKIN: skin color normal, no rashes or significant lesions EYES: normal, Conjunctiva are pink and non-injected, sclera clear  ABDOMEN: soft, non-tender NEURO: alert & oriented x 3 with fluent speech  LABORATORY DATA:  I have reviewed the data as listed    Latest Ref Rng & Units 08/14/2022   11:04 AM 06/26/2022   10:48 AM 05/29/2022   10:05 AM  CBC  WBC 4.0 - 10.5 K/uL 4.9  5.4  7.1   Hemoglobin 13.0 - 17.0 g/dL 12.0  12.4  12.8   Hematocrit 39.0 - 52.0 % 36.5  37.7  38.5   Platelets 150 - 400 K/uL 106  129  159         Latest Ref Rng & Units 08/14/2022   11:04 AM 07/12/2022    9:14 AM 06/26/2022   10:48 AM  CMP  Glucose 70 - 99 mg/dL 160   161   BUN 8 - 23 mg/dL 15   15   Creatinine 0.61 - 1.24 mg/dL 1.25   1.39   Sodium 135 - 145 mmol/L 137   138   Potassium 3.5 - 5.1 mmol/L 3.4   4.0   Chloride  98 - 111 mmol/L 107   109   CO2 22 - 32 mmol/L 26   24   Calcium 8.9 - 10.3 mg/dL 8.8   8.8   Total Protein 6.5 - 8.1 g/dL 6.8  6.6  7.0   Total Bilirubin 0.3 - 1.2 mg/dL 0.7  0.6  0.5   Alkaline Phos 38 - 126 U/L 56  72  58   AST 15 - 41 U/L 39  47  33   ALT 0 - 44 U/L 33  37  30       RADIOGRAPHIC STUDIES: I have personally reviewed the radiological images as listed and agreed with the findings in the report. No results found.    No orders of the defined types were placed in this encounter.  All questions were answered. The patient knows to call the clinic with any problems, questions or concerns. No barriers to learning was detected. The total time spent in the appointment was 30 minutes.     [name], MD 08/14/2022   I, Wilburn Mylar, am acting as scribe for [name], MD.   I have reviewed the above documentation for accuracy and completeness, and I agree with the above.

## 2022-08-15 ENCOUNTER — Other Ambulatory Visit (HOSPITAL_COMMUNITY): Payer: Self-pay

## 2022-08-16 ENCOUNTER — Other Ambulatory Visit: Payer: Self-pay

## 2022-08-16 ENCOUNTER — Other Ambulatory Visit (HOSPITAL_COMMUNITY): Payer: Self-pay

## 2022-08-16 ENCOUNTER — Other Ambulatory Visit: Payer: Self-pay | Admitting: Hematology

## 2022-08-16 DIAGNOSIS — C221 Intrahepatic bile duct carcinoma: Secondary | ICD-10-CM

## 2022-08-16 MED ORDER — REGORAFENIB 40 MG PO TABS
80.0000 mg | ORAL_TABLET | Freq: Every day | ORAL | 2 refills | Status: DC
  Filled 2022-08-16: qty 42, 21d supply, fill #0
  Filled 2022-09-26: qty 42, 28d supply, fill #1
  Filled 2022-10-19 – 2022-10-24 (×3): qty 42, 28d supply, fill #2

## 2022-08-16 NOTE — Telephone Encounter (Signed)
Refilled on 08/14/22. Gardiner Rhyme, RN

## 2022-08-17 ENCOUNTER — Other Ambulatory Visit (HOSPITAL_COMMUNITY): Payer: Self-pay

## 2022-08-28 ENCOUNTER — Other Ambulatory Visit (HOSPITAL_COMMUNITY): Payer: Self-pay

## 2022-08-29 ENCOUNTER — Encounter: Payer: Self-pay | Admitting: Pharmacist Clinician (PhC)/ Clinical Pharmacy Specialist

## 2022-08-29 ENCOUNTER — Ambulatory Visit
Payer: Medicare HMO | Attending: Cardiovascular Disease | Admitting: Pharmacist Clinician (PhC)/ Clinical Pharmacy Specialist

## 2022-08-29 ENCOUNTER — Encounter: Payer: Self-pay | Admitting: Hematology

## 2022-08-29 ENCOUNTER — Other Ambulatory Visit (HOSPITAL_COMMUNITY): Payer: Self-pay

## 2022-08-29 DIAGNOSIS — I10 Essential (primary) hypertension: Secondary | ICD-10-CM

## 2022-08-29 MED ORDER — OLMESARTAN MEDOXOMIL 40 MG PO TABS: 40.0000 mg | ORAL_TABLET | Freq: Every day | ORAL | 3 refills | Status: DC

## 2022-08-29 NOTE — Patient Instructions (Signed)
Return for a a follow up appointment in January with Dr Gwenlyn Found or APP  Check your blood pressure at home 3-4 days each week and keep record of the readings.  Take your BP meds as follows:  Increase olmesartan to 40 mg once daily (take 2 of the 20 mg tabs daily until gone then start 40 mg tabs)  Bring all of your meds, your BP cuff and your record of home blood pressures to your next appointment.  Exercise as you're able, try to walk approximately 30 minutes per day.  Keep salt intake to a minimum, especially watch canned and prepared boxed foods.  Eat more fresh fruits and vegetables and fewer canned items.  Avoid eating in fast food restaurants.    HOW TO TAKE YOUR BLOOD PRESSURE: Rest 5 minutes before taking your blood pressure.  Don't smoke or drink caffeinated beverages for at least 30 minutes before. Take your blood pressure before (not after) you eat. Sit comfortably with your back supported and both feet on the floor (don't cross your legs). Elevate your arm to heart level on a table or a desk. Use the proper sized cuff. It should fit smoothly and snugly around your bare upper arm. There should be enough room to slip a fingertip under the cuff. The bottom edge of the cuff should be 1 inch above the crease of the elbow. Ideally, take 3 measurements at one sitting and record the average.

## 2022-08-29 NOTE — Assessment & Plan Note (Signed)
Patient with essential hypertension, superimposed with medication induced hypertension, doing better since switching losartan to olmesartan.  Will increase olmesartan to 40 mg daily.  (Cannot change metoprolol to carvedilol, as carvedilol is p-glycoprotein inhibitor and interacts with colchicine).  Will monitor labs from the cancer center over the next month.  He is due to follow up with Dr. Gwenlyn Found or an APP in January, so we will have him schedule that today.  Of note, looking at BP readings for 1 week starting on day 4 off regorafenib, his average BP dropped to 125/68).  Will need to monitor his BP readings more closely should this medication be discontinued.

## 2022-08-29 NOTE — Progress Notes (Signed)
08/29/2022 Brandon Sherman 1946/11/19 [id number]   HPI:  Brandon Sherman is a 76 y.o. male patient of Dr Gwenlyn Found, with a [place] below who presents today for hypertension clinic evaluation.  He was seen by Dr. Gwenlyn Found earlier this month and found to have a BP of 168/88.  Hydralazine was increased from 50 mg tid to 75 mg tid.  Today he returns for a follow up visit.  Has been doing well for the past month, no concerns about his medication and no side effects.  At last visit BP was 168/77 and he was started on olmesartan 20 mg daily.  Labs drawn the following month showed a slightly low potassium.  Today he returns for follow up.  He feels as though he is doing well overall, notes home BP machine indicates AFib on all but 8-10 readings (out of 61).  He doesn't feel any symptoms of this.  In June he was started on regorafenib 80 mg daily, on 3 weeks off 1.  It has been shown to increase BP in 30-60% of patients.    Past Medical History: AF Found when Port-A-Cath placed, put on Eliquis  hyperlipidemia 8/23 LDL 66 - on atorvastatin 40 mg  CAD Coronary calcifications seen on CT  cancer Liver - on regorafenib 80 mg qd x 3 wks, off x 1 wk    Blood Pressure Goal:  130/80  Current Medications:   hydralazine 75 mg tid, metoprolol succ 50 mg qd  Family Hx:   father died at 49 from stroke, mother at 15 MI; siblings all with heart disease; 1 daughter with CABG at 11  Social Hx:  no tobacco, secondhand exposure at home, quit alcohol with cancer 3 years ago; decaf coffee, diet coke most day  Diet:  eat out once weekly; good mix of protein, grows garden - eating off that all summer;   Exercise: regular yard work, gardening  Home BP readings:    home arm cuff about 74 years old, has readings from last 9 days -   AM 31 readings average 141/74 HR 59  (range 109-158/59-83)  PM 30 readings average 151/81 HR 60  (range 131-173/70-97)    Previous average (day and night combined) 156/83  Intolerances:   nkda  Labs: 06/26/22:  Na 138, K 4.0, Glu 161, BUN 15, SCr 1.39, GFR 53   Wt Readings from Last 3 Encounters:  08/14/22 202 lb 12.8 oz (92 kg)  07/18/22 203 lb 14.4 oz (92.5 kg)  06/26/22 203 lb 14.4 oz (92.5 kg)   BP Readings from Last 3 Encounters:  08/29/22 (!) 143/76  08/14/22 (!) 131/59  07/18/22 (!) 168/77   Pulse Readings from Last 3 Encounters:  08/29/22 61  08/14/22 60  07/18/22 (!) 52    Current Outpatient Medications  Medication Sig Dispense Refill   allopurinol (ZYLOPRIM) 100 MG tablet Take 100 mg by mouth daily.     atorvastatin (LIPITOR) 40 MG tablet Take 1 tablet (40 mg total) by mouth daily. 90 tablet 3   ELIQUIS 5 MG TABS tablet TAKE 1 TABLET TWICE DAILY 180 tablet 1   fenofibrate (TRICOR) 145 MG tablet Take 145 mg by mouth daily.     finasteride (PROSCAR) 5 MG tablet Take 5 mg by mouth daily.     furosemide (LASIX) 40 MG tablet TAKE 2 TABLETS (80 MG TOTAL) BY MOUTH 2 (TWO) TIMES DAILY. 360 tablet 3   gabapentin (NEURONTIN) 100 MG capsule Take 100 mg by  mouth at bedtime.     hydrALAZINE (APRESOLINE) 50 MG tablet Take 1.5 tablets (75 mg total) by mouth 3 (three) times daily. 135 tablet 3   KLOR-CON M20 20 MEQ tablet TAKE 1 TABLET BY MOUTH TWICE A DAY 180 tablet 3   lidocaine-prilocaine (EMLA) cream Apply 1 application topically as needed. 30 g 3   magnesium oxide (MAG-OX) 400 (240 Mg) MG tablet TAKE 1 TABLET BY MOUTH EVERY DAY 30 tablet 1   metFORMIN (GLUCOPHAGE-XR) 500 MG 24 hr tablet      metoprolol succinate (TOPROL-XL) 50 MG 24 hr tablet Take 1 tablet (50 mg total) by mouth daily. Take with or immediately following a meal. 90 tablet 3   olmesartan (BENICAR) 40 MG tablet Take 1 tablet (40 mg total) by mouth daily. 90 tablet 3   colchicine 0.6 MG tablet Take 1 tablet (0.6 mg total) by mouth daily as needed (gout flare). Take daily for 3 days, then as needed for gout flare 30 tablet 0   ondansetron (ZOFRAN) 8 MG tablet Take 1 tablet (8 mg total) by mouth every 8  (eight) hours as needed for nausea or vomiting. 30 tablet 1   pantoprazole (PROTONIX) 40 MG tablet TAKE 1 TABLET BY MOUTH TWICE A DAY 180 tablet 2   regorafenib (STIVARGA) 40 MG tablet Take 2 tablets (80 mg total) by mouth daily. Take for 3 weeks then off for one week. 42 tablet 2   sucralfate (CARAFATE) 1 g tablet Take 1 tablet (1 g total) by mouth every 6 (six) hours as needed. Please schedule a yearly follow up for further refills: [phone] 60 tablet 0   tamsulosin (FLOMAX) 0.4 MG CAPS capsule Take 0.4 mg by mouth daily.     No current facility-administered medications for this visit.    No Known Allergies  Past Medical History:  Diagnosis Date   Arthritis    Diabetes (HCC)    GERD (gastroesophageal reflux disease)    Hyperlipidemia    Hypertension    Intrahepatic cholangiocarcinoma (HCC)     Blood pressure (!) 143/76, pulse 61.  HYPERTENSION CONTROL Vitals:   08/29/22 0910 08/29/22 0918  BP: (!) 151/79 (!) 143/76    The patient's blood pressure is elevated above target today.  In order to address the patient's elevated BP: Blood pressure will be monitored at home to determine if medication changes need to be made.; A current anti-hypertensive medication was adjusted today.; A referral to the [facility] will be placed.      Essential hypertension Patient with essential hypertension, superimposed with medication induced hypertension, doing better since switching losartan to olmesartan.  Will increase olmesartan to 40 mg daily.  (Cannot change metoprolol to carvedilol, as carvedilol is p-glycoprotein inhibitor and interacts with colchicine).  Will monitor labs from the cancer center over the next month.  He is due to follow up with Dr. Gwenlyn Found or an APP in January, so we will have him schedule that today.  Of note, looking at BP readings for 1 week starting on day 4 off regorafenib, his average BP dropped to 125/68).  Will need to monitor his BP readings more  closely should this medication be discontinued.     Tommy Medal PharmD CPP [facility] [address] [place],  [address] [phone]

## 2022-09-11 ENCOUNTER — Inpatient Hospital Stay (HOSPITAL_BASED_OUTPATIENT_CLINIC_OR_DEPARTMENT_OTHER): Payer: Medicare HMO | Admitting: Hematology

## 2022-09-11 ENCOUNTER — Other Ambulatory Visit: Payer: Self-pay

## 2022-09-11 ENCOUNTER — Encounter: Payer: Self-pay | Admitting: Hematology

## 2022-09-11 ENCOUNTER — Inpatient Hospital Stay: Payer: Medicare HMO | Attending: Hematology

## 2022-09-11 VITALS — BP 163/73 | HR 59 | Temp 98.0°F | Resp 18 | Ht 69.0 in | Wt 202.5 lb

## 2022-09-11 DIAGNOSIS — Z79899 Other long term (current) drug therapy: Secondary | ICD-10-CM | POA: Insufficient documentation

## 2022-09-11 DIAGNOSIS — C221 Intrahepatic bile duct carcinoma: Secondary | ICD-10-CM | POA: Insufficient documentation

## 2022-09-11 DIAGNOSIS — G629 Polyneuropathy, unspecified: Secondary | ICD-10-CM | POA: Insufficient documentation

## 2022-09-11 DIAGNOSIS — Z95828 Presence of other vascular implants and grafts: Secondary | ICD-10-CM

## 2022-09-11 LAB — CBC WITH DIFFERENTIAL/PLATELET
Abs Immature Granulocytes: 0.01 10*3/uL (ref 0.00–0.07)
Basophils Absolute: 0.1 10*3/uL (ref 0.0–0.1)
Basophils Relative: 1 %
Eosinophils Absolute: 0.1 10*3/uL (ref 0.0–0.5)
Eosinophils Relative: 1 %
HCT: 37 % — ABNORMAL LOW (ref 39.0–52.0)
Hemoglobin: 12.1 g/dL — ABNORMAL LOW (ref 13.0–17.0)
Immature Granulocytes: 0 %
Lymphocytes Relative: 17 %
Lymphs Abs: 0.9 10*3/uL (ref 0.7–4.0)
MCH: 29.4 pg (ref 26.0–34.0)
MCHC: 32.7 g/dL (ref 30.0–36.0)
MCV: 89.8 fL (ref 80.0–100.0)
Monocytes Absolute: 0.5 10*3/uL (ref 0.1–1.0)
Monocytes Relative: 10 %
Neutro Abs: 3.5 10*3/uL (ref 1.7–7.7)
Neutrophils Relative %: 71 %
Platelets: 114 10*3/uL — ABNORMAL LOW (ref 150–400)
RBC: 4.12 MIL/uL — ABNORMAL LOW (ref 4.22–5.81)
RDW: 17.2 % — ABNORMAL HIGH (ref 11.5–15.5)
WBC: 4.9 10*3/uL (ref 4.0–10.5)
nRBC: 0 % (ref 0.0–0.2)

## 2022-09-11 LAB — COMPREHENSIVE METABOLIC PANEL
ALT: 33 U/L (ref 0–44)
AST: 41 U/L (ref 15–41)
Albumin: 3.7 g/dL (ref 3.5–5.0)
Alkaline Phosphatase: 64 U/L (ref 38–126)
Anion gap: 5 (ref 5–15)
BUN: 16 mg/dL (ref 8–23)
CO2: 23 mmol/L (ref 22–32)
Calcium: 8.9 mg/dL (ref 8.9–10.3)
Chloride: 109 mmol/L (ref 98–111)
Creatinine, Ser: 1.05 mg/dL (ref 0.61–1.24)
GFR, Estimated: >60 mL/min (ref >60)
Glucose, Bld: 127 mg/dL — ABNORMAL HIGH (ref 70–99)
Potassium: 3.7 mmol/L (ref 3.5–5.1)
Sodium: 137 mmol/L (ref 135–145)
Total Bilirubin: 0.7 mg/dL (ref 0.3–1.2)
Total Protein: 7 g/dL (ref 6.5–8.1)

## 2022-09-11 MED ORDER — ONDANSETRON HCL 8 MG PO TABS: 8.0000 mg | ORAL_TABLET | Freq: Three times a day (TID) | ORAL | 1 refills | Status: DC | PRN

## 2022-09-11 MED ORDER — HEPARIN SOD (PORK) LOCK FLUSH 100 UNIT/ML IV SOLN
500.0000 [IU] | Freq: Once | INTRAVENOUS | Status: AC
  Administered 2022-09-11: 500 [IU]

## 2022-09-11 MED ORDER — SODIUM CHLORIDE 0.9% FLUSH
10.0000 mL | Freq: Once | INTRAVENOUS | Status: AC
  Administered 2022-09-11: 10 mL

## 2022-09-11 NOTE — Progress Notes (Signed)
[place]   [phone] [phone]   Clinic Follow up Note   Patient Care Team: Marga Hoots, NP as PCP - General (Adult Health Nurse Practitioner) Lorretta Harp, MD as PCP - Cardiology (Cardiology) [name], MD as Consulting Physician (General Surgery) [name], [name], MD as Consulting Physician (Gastroenterology) [name], MD as Consulting Physician (Hematology) Lorretta Harp, MD as Consulting Physician (Cardiology)  Date of Service:  09/11/2022  CHIEF COMPLAINT: f/u of cholangiocarcinoma  CURRENT THERAPY:  Stivarga, q28d, starting 05/15/22 -dose: 80 mg daily d1-21  ASSESSMENT & PLAN:  Brandon Sherman is a 76 y.o. male with   1. Intrahepatic cholangiocarcinoma, cT2N0Mx, unresectable, with indeterminate lung nodules, IDH mutation (+) -Diagnosed in 07/2019. CT scans and MRI liver show a large 7.3cm mass in the right hepatic lobe which abuts portal vein. Felt to be not resectable due to the invasion to portal vein. -s/p first line Cisplatin and Gemcitabine, 09/29/19 - 07/19/20. Cisplatin discontinued 03/23/20 due to Afib. -His FO results showed MSI stable disease, IDH1 mutations he may benefit from IDH inhibitor  -he progressed through single agent gemcitabine (12/24/20 - 03/16/21), FOLFOX plus durvalumab (04/05/21 - 10/21/21), FOLFIRI (11/01/21 - 01/19/22), and then tried ivosidenib (02/02/22 - 04/2022) -due to disease progression, we switched to [place] on 05/15/22. He tolerated low-dose 80 mg daily well, but developed multiple side effects on 120 mg daily. He is tolerating 17m for 3 weeks on, 1 week off with no noticeable side effects. -restaging abdomen MRI and chest CT on 08/09/22 showed overall stable to mildly progressed disease, plan to continue stirvaga, given limited treatment options left. -labs reviewed, overall stable, hgb 12.1, plt 114k. Will continue stirvaga at same dose given his good tolerance. 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   No problem-specific Assessment & Plan notes found for this encounter.   SUMMARY OF ONCOLOGIC HISTORY: Oncology History Overview Note  Cancer Staging Intrahepatic cholangiocarcinoma ([place] Staging form: Intrahepatic Bile Duct, AJCC 8th Edition - Clinical stage from 08/19/2019: Stage II (cT2, cN0, cM0) - Signed by FTruitt Merle MD on 08/19/2019    Intrahepatic cholangiocarcinoma ([facility]  06/28/2019 Imaging   CT AP W Contrast 06/28/19  IMPRESSION: 1. Heterogeneous hypodensity posteriorly in the right hepatic lobe and potentially extending into the caudate lobe suspicious for a mass. There is felt to be truncation of branches of the portal vein in this vicinity and some narrowing of the hepatic vein, as well as triangular-shaped regions of abnormal hypoenhancement posteriorly in the right hepatic lobe likely representing downstream vascular effects. Cannot exclude malignancy such as cholangiocarcinoma or hepatocellular carcinoma, and follow up hepatic protocol MRI with and without contrast is recommended to further characterize. 2. 4 mm right middle lobe pulmonary nodule is likely benign but may merit surveillance. 3. Cholelithiasis. 4.  Aortic Atherosclerosis (ICD10-I70.0). 5. Prostatomegaly. 6. Mild impingement at L3-4 and L4-5.   07/31/2019 Imaging   MRI Liver 07/31/19 IMPRESSION: 1. 7.3 cm in long axis mass in the right hepatic lobe spanning into the caudate lobe, high suspicion for malignancy such as hepatocellular carcinoma or cholangiocarcinoma. Suspected effacement or occlusion  of the right hepatic vein and posterior branches of the right portal vein. Two  smaller tumor nodules along the posterior periphery of the dominant mass. Tissue diagnosis is recommended. 2. No findings of pathologic adenopathy or distant metastatic spread. 3. 9 mm gallstone in the gallbladder. There is mild gallbladder wall thickening which may be from nondistention, correlate clinically in assessing for cholecystitis. 4.  Aortic Atherosclerosis (ICD10-I70.0). 5. Mild diffuse hepatic steatosis.   08/11/2019 Initial Biopsy   DIAGNOSIS: 08/11/19  A. LIVER, RIGHT, BIOPSY:  - Adenocarcinoma.   08/18/2019 Imaging   CT Chest 08/18/19  IMPRESSION: 1. Multiple pulmonary nodules largest at approximately 7 mm in the right lower lobe, nonspecific but concerning given findings in the liver. 2. No signs of definitive metastatic disease, also with mildly enlarged upper abdominal lymph nodes as discussed.   Aortic Atherosclerosis (ICD10-I70.0).   08/19/2019 Initial Diagnosis   Intrahepatic cholangiocarcinoma ([place])   08/19/2019 Cancer Staging   Staging form: Intrahepatic Bile Duct, AJCC 8th Edition - Clinical stage from 08/19/2019: Stage II (cT2, cN0, cM0) - Signed by [name], MD on 08/19/2019   09/29/2019 - 07/19/2020 Chemotherapy   Cisplatin and Gemcitabine 2 weeks on/1 week off starting 09/29/19. Cisplatin held from cycle 9 (03/23/20) due to fluid status/Afib. He is now on maintenance Gemcitabine. Held after 07/19/20 to proceed with liver target therapy.     10/09/2019 Imaging   CT AP IMPRESSION: 1. The dominant right hepatic lobe mass is minimally reduced in size compared to prior exams, currently measuring 6.2 by 5.3 cm, previously 6.2 by 5.6 cm. However, there is a new small hypodense lesion centrally in the right hepatic lobe which is suspicious for a new small focus of tumor. Accordingly this is an overall mixed appearance. 2. Continued hypoenhancement in the liver downstream of the tumor likely attributable to narrowing or occlusion of the right hepatic vein by the tumor.  By virtue of its location the tumor wraps around the intrahepatic portion of the IVC. 3. 4 mm right middle lobe pulmonary nodule, stable compared to earliest available comparison of 07/18/2019. Surveillance of the patient's pulmonary nodules is recommended. 4. Other imaging findings of potential clinical significance: Coronary atherosclerosis. Cholelithiasis. Prominent stool throughout the colon favors constipation. Moderate prostatomegaly with heterogeneous enhancement of the prostate gland. Lumbar spondylosis and degenerative disc disease causing mild bilateral foraminal impingement at L3-4 and L4-5.   Aortic Atherosclerosis (ICD10-I70.0).   12/24/2019 Imaging   CT CAP W Contrast  IMPRESSION: 1. Right hepatic lobe mass and adjacent right hepatic lobe nodules appear grossly stable. No evidence of distant metastatic disease. 2. Continued stability of small pulmonary nodules. Recommend attention on follow-up. 3. Cholelithiasis. 4. Enlarged prostate. 5. Aortic atherosclerosis (ICD10-I70.0). Coronary artery calcification.   02/23/2020 Procedure   He had PAC placed on 02/23/20.    04/08/2020 Imaging   CT CAP  IMPRESSION: 1. Stable or minimally decreased size of a very ill-defined, hypodense and somewhat retractile appearing mass of the central right lobe of the liver abutting the inferior vena cava and right portal vein, measuring approximately 5.7 x 5.0 cm, previously 6.2 x 5.2 cm when measured similarly. Findings are consistent with stable or minimally improved cholangiocarcinoma. 2. Small hypodense nodules of the right lobe identified on prior examination are poorly appreciated on this single phase contrast examination although not grossly changed. Attention on follow-up. 3. There are new, moderate bilateral pleural effusions and associated atelectasis or consolidation as well as a new small pericardial effusion, nonspecific although generally concerning and  suspicious for malignant  effusions. There is no directly visualized pleural nodularity. 4. Multiple small pulmonary nodules are stable.  No new nodules. 5. Coronary artery disease. Aortic Atherosclerosis (ICD10-I70.0).   07/06/2020 Imaging   MRI ABD IMPRESSION: 1. Interval decrease in size of the right hepatic lobe lesion in the medial aspect of the segment 7. Findings would suggest a good response to treatment with some contraction of the tumor. 2. No new hepatic lesions. No abdominal adenopathy or metastatic disease. 3. Stable mild intrahepatic biliary dilatation in the right hepatic lobe distal to the lesion. 4. Somewhat tortuous and almost beaded appearance of the hepatic and portal vein radicles. Findings could be radiation related. 5. Cholelithiasis.  CT chest wo contrast IMPRESSION: 1. Persistent/stable moderate-sized bilateral pleural effusions with overlying atelectasis. 2. Stable small right pulmonary nodules. No new or progressive findings. Recommend continued surveillance. 3. No mediastinal or hilar mass or adenopathy. 4. Stable advanced three-vessel coronary artery calcifications. 5. Cholelithiasis. Aortic Atherosclerosis (ICD10-I70.0)   08/11/2020 Procedure   Y90 on 08/11/20 and 08/26/20 with Dr Laurence Ferrari    11/30/2020 Imaging   MRI Abdomen  IMPRESSION: 1. Today's study demonstrates progression of disease with enlarging central lesion in the right lobe of the liver involving portions of segments 7 and 8, now with evidence of some tumor thrombus extension into the intrahepatic portion of the inferior vena cava. This is associated with increasing intrahepatic biliary ductal dilatation in segment 7 of the liver. 2. In addition, there is some subtle hyperenhancement of the distal common bile duct at and immediately proximally to the level of the ampulla. There is also some subtle delayed enhancement around this region in the pancreatic head. This is of uncertain etiology and significance, but may  be inflammatory and currently is not associated with proximal common bile duct dilatation. However, close attention on follow-up imaging is recommended. 3. Cholelithiasis without evidence of acute cholecystitis at this time. 4. Aortic atherosclerosis.     12/24/2020 - 03/21/2021 Chemotherapy   Restart Gemcitabine 2 weeks on/1 week off given disease progression beginning 12/24/20. Discontinued after 03/21/21 due to disease progression in liver.     03/16/2021 Imaging   MRI abdomen  IMPRESSION: No significant change in size of irregular hypovascular mass in the medial right hepatic lobe.   New 1.1 cm hypovascular lesion with peripheral rim enhancement in liver dome, suspicious for metastatic disease.   New small right pleural effusion and mild ascites.   Cholelithiasis, without evidence of cholecystitis or biliary dilatation.   04/05/2021 -  Chemotherapy   Second-line FOLFOX q2weeks starting 04/05/21    04/05/2021 -  Chemotherapy   Immunotherapy with Durvalumab (Imfinzi) q4weeks starting 04/05/21. (in addition to second line FOLFOX)   06/23/2021 Imaging   MRI Abdomen IMPRESSION: 1. No significant change in subcapsular mass of the posterior right lobe of the liver. 2. No significant change in an additional small hyperenhancing lesion of the liver dome, which remains suspicious for a satellite lesion. 3. No new liver lesions. 4. 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 5. Cholelithiasis without evidence of acute cholecystitis. 6. Trace ascites.  CT Chest w/o contrast IMPRESSION: 1. Occasional small pulmonary nodules in the right lung are stable, for example a 6 mm nodule right lower lobe and a 3 mm nodule of the lateral segment right middle lobe. These are nonspecific and likely benign, incidental sequelae of infection or inflammation, metastatic disease not favored. Attention on follow-up. 2. Previously seen bilateral pleural effusions are resolved. 3. Hypodense  lesion  of the posterior right lobe of the liver, keeping with known cholangiocarcinoma and better characterized by same day MR. 4. Coronary artery disease.   10/21/2021 Imaging   EXAM: MRI ABDOMEN WITHOUT AND WITH CONTRAST  IMPRESSION: 1. Mild progression of dominant right hepatic lobe mass with involvement of the right portal, hepatic veins and minimal extension into the IVC. 2. Hepatic dome satellite lesion, new or more conspicuous today. 3. Cholelithiasis 4. Decrease in trace perihepatic and perisplenic ascites. 5.  Aortic Atherosclerosis (ICD10-I70.0).   10/21/2021 Imaging   EXAM: CT CHEST WITHOUT CONTRAST  IMPRESSION: 1. Since 06/23/2021, enlargement of a right lower lobe and possible enlargement of a right middle lobe pulmonary nodule. Findings are suspicious for metastatic disease. 2. No thoracic adenopathy. 3. Coronary artery atherosclerosis. Aortic Atherosclerosis (ICD10-I70.0).   11/01/2021 - 01/11/2022 Chemotherapy   Patient is on Treatment Plan : PANCREAS FOLFIRI q14d     01/19/2022 Imaging   EXAM: CT CHEST WITHOUT CONTRAST  IMPRESSION: 1. Continued increase in size of lung nodule within the posterior right lung base which now measures 1 cm and is suspicious for metastatic disease. The remaining lung nodules are stable from 10/21/2021. 2. No thoracic adenopathy. 3. Aortic Atherosclerosis (ICD10-I70.0). Coronary artery calcifications.   01/19/2022 Imaging   EXAM: MRI ABDOMEN WITHOUT AND WITH CONTRAST  IMPRESSION: 1. No significant change in size or appearance of the large heterogeneous enhancing mass area in the right hepatic lobe as described. There is mild interval enlargement of the adjacent satellite lesion near the dome of the right lobe now measuring 1.9 cm. 2. Redemonstration of a pulmonary nodule in the right lower lobe measuring 9 mm. 3. Small volume ascites. 4. Splenomegaly. 5. Cholelithiasis.      INTERVAL HISTORY:  Brandon Sherman is  here for a follow up of cholangiocarcinoma. He was last seen by me on 08/14/22. He presents to the clinic accompanied by his wife. He reports he is doing well overall. He notes he continues to do yard work without issue. He denies any abdominal bloating.   All other systems were reviewed with the patient and are negative.  MEDICAL HISTORY:  Past Medical History:  Diagnosis Date   Arthritis    Diabetes ([place])    GERD (gastroesophageal reflux disease)    Hyperlipidemia    Hypertension    Intrahepatic cholangiocarcinoma ([place])     SURGICAL HISTORY: Past Surgical History:  Procedure Laterality Date   APPENDECTOMY  1980   CARDIOVERSION N/A 04/27/2020   Procedure: CARDIOVERSION;  Surgeon: Dorothy Spark, MD;  Location: [facility];  Service: Cardiovascular;  Laterality: N/A;   CARDIOVERSION N/A 05/19/2020   Procedure: CARDIOVERSION;  Surgeon: Elouise Munroe, MD;  Location: [facility];  Service: Cardiovascular;  Laterality: N/A;   COLONOSCOPY     IR 3D INDEPENDENT WKST  08/11/2020   IR 3D INDEPENDENT WKST  08/11/2020   IR ANGIOGRAM SELECTIVE EACH ADDITIONAL VESSEL  08/11/2020   IR ANGIOGRAM SELECTIVE EACH ADDITIONAL VESSEL  08/11/2020   IR ANGIOGRAM SELECTIVE EACH ADDITIONAL VESSEL  08/11/2020   IR ANGIOGRAM SELECTIVE EACH ADDITIONAL VESSEL  08/11/2020   IR ANGIOGRAM SELECTIVE EACH ADDITIONAL VESSEL  08/11/2020   IR ANGIOGRAM SELECTIVE EACH ADDITIONAL VESSEL  08/11/2020   IR ANGIOGRAM SELECTIVE EACH ADDITIONAL VESSEL  08/26/2020   IR ANGIOGRAM SELECTIVE EACH ADDITIONAL VESSEL  08/26/2020   IR ANGIOGRAM VISCERAL SELECTIVE  08/11/2020   IR ANGIOGRAM VISCERAL SELECTIVE  08/26/2020   IR EMBO ARTERIAL NOT HEMORR HEMANG INC GUIDE ROADMAPPING  08/11/2020  IR EMBO TUMOR ORGAN ISCHEMIA INFARCT INC GUIDE ROADMAPPING  08/26/2020   IR IMAGING GUIDED PORT INSERTION  02/23/2020   IR RADIOLOGIST EVAL & MGMT  07/14/2020   IR RADIOLOGIST EVAL & MGMT  09/30/2020   IR RADIOLOGIST EVAL & MGMT  12/01/2020   IR US  GUIDE VASC ACCESS LEFT  08/26/2020   IR US GUIDE VASC ACCESS RIGHT  08/11/2020    I have reviewed the social history and family history with the patient and they are unchanged from previous note.  ALLERGIES:  has No Known Allergies.  MEDICATIONS:  Current Outpatient Medications  Medication Sig Dispense Refill   allopurinol (ZYLOPRIM) 100 MG tablet Take 100 mg by mouth daily.     atorvastatin (LIPITOR) 40 MG tablet Take 1 tablet (40 mg total) by mouth daily. 90 tablet 3   colchicine 0.6 MG tablet Take 1 tablet (0.6 mg total) by mouth daily as needed (gout flare). Take daily for 3 days, then as needed for gout flare 30 tablet 0   ELIQUIS 5 MG TABS tablet TAKE 1 TABLET TWICE DAILY 180 tablet 1   fenofibrate (TRICOR) 145 MG tablet Take 145 mg by mouth daily.     finasteride (PROSCAR) 5 MG tablet Take 5 mg by mouth daily.     furosemide (LASIX) 40 MG tablet TAKE 2 TABLETS (80 MG TOTAL) BY MOUTH 2 (TWO) TIMES DAILY. 360 tablet 3   gabapentin (NEURONTIN) 100 MG capsule Take 100 mg by mouth at bedtime.     hydrALAZINE (APRESOLINE) 50 MG tablet Take 1.5 tablets (75 mg total) by mouth 3 (three) times daily. 135 tablet 3   KLOR-CON M20 20 MEQ tablet TAKE 1 TABLET BY MOUTH TWICE A DAY 180 tablet 3   lidocaine-prilocaine (EMLA) cream Apply 1 application topically as needed. 30 g 3   magnesium oxide (MAG-OX) 400 (240 Mg) MG tablet TAKE 1 TABLET BY MOUTH EVERY DAY 30 tablet 1   metFORMIN (GLUCOPHAGE-XR) 500 MG 24 hr tablet      metoprolol succinate (TOPROL-XL) 50 MG 24 hr tablet Take 1 tablet (50 mg total) by mouth daily. Take with or immediately following a meal. 90 tablet 3   olmesartan (BENICAR) 40 MG tablet Take 1 tablet (40 mg total) by mouth daily. 90 tablet 3   ondansetron (ZOFRAN) 8 MG tablet Take 1 tablet (8 mg total) by mouth every 8 (eight) hours as needed for nausea or vomiting. 30 tablet 1   pantoprazole (PROTONIX) 40 MG tablet TAKE 1 TABLET BY MOUTH TWICE A DAY 180 tablet 2   regorafenib  (STIVARGA) 40 MG tablet Take 2 tablets (80 mg total) by mouth daily. Take for 3 weeks then off for one week. 42 tablet 2   sucralfate (CARAFATE) 1 g tablet Take 1 tablet (1 g total) by mouth every 6 (six) hours as needed. Please schedule a yearly follow up for further refills: [phone] 60 tablet 0   tamsulosin (FLOMAX) 0.4 MG CAPS capsule Take 0.4 mg by mouth daily.     No current facility-administered medications for this visit.    PHYSICAL EXAMINATION: ECOG PERFORMANCE STATUS: 1 - Symptomatic but completely ambulatory  Vitals:   09/11/22 1335  BP: (!) 163/73  Pulse: (!) 59  Resp: 18  Temp: 98 F (36.7 C)  SpO2: 100%   Wt Readings from Last 3 Encounters:  09/11/22 202 lb 8 oz (91.9 kg)  08/14/22 202 lb 12.8 oz (92 kg)  07/18/22 203 lb 14.4 oz (92.5 kg)  GENERAL:alert, no distress and comfortable SKIN: skin color, texture, turgor are normal, no rashes or significant lesions EYES: normal, Conjunctiva are pink and non-injected, sclera clear  LUNGS: clear to auscultation and percussion with normal breathing effort HEART: regular rate & rhythm and no murmurs and no lower extremity edema ABDOMEN:abdomen soft, non-tender and normal bowel sounds Musculoskeletal:no cyanosis of digits and no clubbing  NEURO: alert & oriented x 3 with fluent speech, no focal motor/sensory deficits  LABORATORY DATA:  I have reviewed the data as listed    Latest Ref Rng & Units 09/11/2022    1:04 PM 08/14/2022   11:04 AM 06/26/2022   10:48 AM  CBC  WBC 4.0 - 10.5 K/uL 4.9  4.9  5.4   Hemoglobin 13.0 - 17.0 g/dL 12.1  12.0  12.4   Hematocrit 39.0 - 52.0 % 37.0  36.5  37.7   Platelets 150 - 400 K/uL 114  106  129         Latest Ref Rng & Units 09/11/2022    1:04 PM 08/14/2022   11:04 AM 07/12/2022    9:14 AM  CMP  Glucose 70 - 99 mg/dL 127  160    BUN 8 - 23 mg/dL 16  15    Creatinine 0.61 - 1.24 mg/dL 1.05  1.25    Sodium 135 - 145 mmol/L 137  137    Potassium 3.5 - 5.1 mmol/L 3.7  3.4     Chloride 98 - 111 mmol/L 109  107    CO2 22 - 32 mmol/L 23  26    Calcium 8.9 - 10.3 mg/dL 8.9  8.8    Total Protein 6.5 - 8.1 g/dL 7.0  6.8  6.6   Total Bilirubin 0.3 - 1.2 mg/dL 0.7  0.7  0.6   Alkaline Phos 38 - 126 U/L 64  56  72   AST 15 - 41 U/L 41  39  47   ALT 0 - 44 U/L 33  33  37       RADIOGRAPHIC STUDIES: I have personally reviewed the radiological images as listed and agreed with the findings in the report. No results found.    No orders of the defined types were placed in this encounter.  All questions were answered. The patient knows to call the clinic with any problems, questions or concerns. No barriers to learning was detected. The total time spent in the appointment was 30 minutes.     [name], MD 09/11/2022   I, Wilburn Mylar, am acting as scribe for [name], MD.   I have reviewed the above documentation for accuracy and completeness, and I agree with the above.

## 2022-09-12 ENCOUNTER — Other Ambulatory Visit (HOSPITAL_COMMUNITY): Payer: Self-pay

## 2022-09-18 ENCOUNTER — Other Ambulatory Visit: Payer: Self-pay | Admitting: Cardiovascular Disease

## 2022-09-18 ENCOUNTER — Other Ambulatory Visit: Payer: Self-pay | Admitting: Physician Assistant

## 2022-09-18 DIAGNOSIS — R0600 Dyspnea, unspecified: Secondary | ICD-10-CM

## 2022-09-18 DIAGNOSIS — I4811 Longstanding persistent atrial fibrillation: Secondary | ICD-10-CM

## 2022-09-18 NOTE — Telephone Encounter (Signed)
Eliquis '5mg'$  refill request received. Patient is 76 years old, weight-91.9kg, Crea-1.05 on 09/11/2022, Diagnosis-Afib, and last seen by Dr. Gwenlyn Found on 06/20/2022. Dose is appropriate based on dosing criteria. Will send in refill to requested pharmacy.

## 2022-09-19 DIAGNOSIS — E785 Hyperlipidemia, unspecified: Secondary | ICD-10-CM | POA: Diagnosis not present

## 2022-09-19 DIAGNOSIS — E1122 Type 2 diabetes mellitus with diabetic chronic kidney disease: Secondary | ICD-10-CM | POA: Diagnosis not present

## 2022-09-19 DIAGNOSIS — I10 Essential (primary) hypertension: Secondary | ICD-10-CM | POA: Diagnosis not present

## 2022-09-21 ENCOUNTER — Other Ambulatory Visit (HOSPITAL_COMMUNITY): Payer: Self-pay

## 2022-09-25 ENCOUNTER — Other Ambulatory Visit (HOSPITAL_COMMUNITY): Payer: Self-pay

## 2022-09-26 ENCOUNTER — Other Ambulatory Visit (HOSPITAL_COMMUNITY): Payer: Self-pay

## 2022-09-28 DIAGNOSIS — Z23 Encounter for immunization: Secondary | ICD-10-CM | POA: Diagnosis not present

## 2022-09-28 DIAGNOSIS — E1122 Type 2 diabetes mellitus with diabetic chronic kidney disease: Secondary | ICD-10-CM | POA: Diagnosis not present

## 2022-09-28 DIAGNOSIS — I129 Hypertensive chronic kidney disease with stage 1 through stage 4 chronic kidney disease, or unspecified chronic kidney disease: Secondary | ICD-10-CM | POA: Diagnosis not present

## 2022-09-28 DIAGNOSIS — K219 Gastro-esophageal reflux disease without esophagitis: Secondary | ICD-10-CM | POA: Diagnosis not present

## 2022-09-28 DIAGNOSIS — N182 Chronic kidney disease, stage 2 (mild): Secondary | ICD-10-CM | POA: Diagnosis not present

## 2022-09-28 DIAGNOSIS — I4891 Unspecified atrial fibrillation: Secondary | ICD-10-CM | POA: Diagnosis not present

## 2022-09-28 DIAGNOSIS — E785 Hyperlipidemia, unspecified: Secondary | ICD-10-CM | POA: Diagnosis not present

## 2022-10-09 ENCOUNTER — Encounter: Payer: Self-pay | Admitting: Hematology

## 2022-10-09 ENCOUNTER — Inpatient Hospital Stay: Payer: Medicare HMO | Attending: Hematology

## 2022-10-09 ENCOUNTER — Inpatient Hospital Stay: Payer: Medicare HMO | Admitting: Hematology

## 2022-10-09 ENCOUNTER — Other Ambulatory Visit: Payer: Self-pay

## 2022-10-09 VITALS — BP 145/72 | HR 72 | Temp 97.8°F | Resp 18 | Ht 69.0 in | Wt 210.0 lb

## 2022-10-09 DIAGNOSIS — G629 Polyneuropathy, unspecified: Secondary | ICD-10-CM | POA: Insufficient documentation

## 2022-10-09 DIAGNOSIS — R918 Other nonspecific abnormal finding of lung field: Secondary | ICD-10-CM | POA: Diagnosis not present

## 2022-10-09 DIAGNOSIS — C221 Intrahepatic bile duct carcinoma: Secondary | ICD-10-CM | POA: Insufficient documentation

## 2022-10-09 DIAGNOSIS — Z95828 Presence of other vascular implants and grafts: Secondary | ICD-10-CM

## 2022-10-09 DIAGNOSIS — Z79899 Other long term (current) drug therapy: Secondary | ICD-10-CM | POA: Diagnosis not present

## 2022-10-09 LAB — COMPREHENSIVE METABOLIC PANEL
ALT: 29 U/L (ref 0–44)
AST: 39 U/L (ref 15–41)
Albumin: 3.7 g/dL (ref 3.5–5.0)
Alkaline Phosphatase: 65 U/L (ref 38–126)
Anion gap: 7 (ref 5–15)
BUN: 17 mg/dL (ref 8–23)
CO2: 25 mmol/L (ref 22–32)
Calcium: 9.1 mg/dL (ref 8.9–10.3)
Chloride: 107 mmol/L (ref 98–111)
Creatinine, Ser: 1.17 mg/dL (ref 0.61–1.24)
GFR, Estimated: >60 mL/min (ref >60)
Glucose, Bld: 123 mg/dL — ABNORMAL HIGH (ref 70–99)
Potassium: 3.7 mmol/L (ref 3.5–5.1)
Sodium: 139 mmol/L (ref 135–145)
Total Bilirubin: 0.9 mg/dL (ref 0.3–1.2)
Total Protein: 7.1 g/dL (ref 6.5–8.1)

## 2022-10-09 LAB — CBC WITH DIFFERENTIAL/PLATELET
Abs Immature Granulocytes: 0.02 10*3/uL (ref 0.00–0.07)
Basophils Absolute: 0 10*3/uL (ref 0.0–0.1)
Basophils Relative: 1 %
Eosinophils Absolute: 0.1 10*3/uL (ref 0.0–0.5)
Eosinophils Relative: 3 %
HCT: 34.8 % — ABNORMAL LOW (ref 39.0–52.0)
Hemoglobin: 11.6 g/dL — ABNORMAL LOW (ref 13.0–17.0)
Immature Granulocytes: 1 %
Lymphocytes Relative: 17 %
Lymphs Abs: 0.7 10*3/uL (ref 0.7–4.0)
MCH: 30.2 pg (ref 26.0–34.0)
MCHC: 33.3 g/dL (ref 30.0–36.0)
MCV: 90.6 fL (ref 80.0–100.0)
Monocytes Absolute: 0.4 10*3/uL (ref 0.1–1.0)
Monocytes Relative: 10 %
Neutro Abs: 2.9 10*3/uL (ref 1.7–7.7)
Neutrophils Relative %: 68 %
Platelets: 106 10*3/uL — ABNORMAL LOW (ref 150–400)
RBC: 3.84 MIL/uL — ABNORMAL LOW (ref 4.22–5.81)
RDW: 16.8 % — ABNORMAL HIGH (ref 11.5–15.5)
WBC: 4.2 10*3/uL (ref 4.0–10.5)
nRBC: 0 % (ref 0.0–0.2)

## 2022-10-09 MED ORDER — HEPARIN SOD (PORK) LOCK FLUSH 100 UNIT/ML IV SOLN
500.0000 [IU] | Freq: Once | INTRAVENOUS | Status: AC
  Administered 2022-10-09: 500 [IU]

## 2022-10-09 MED ORDER — SODIUM CHLORIDE 0.9% FLUSH
10.0000 mL | Freq: Once | INTRAVENOUS | Status: AC
  Administered 2022-10-09: 10 mL

## 2022-10-09 NOTE — Progress Notes (Signed)
[place]   [phone]) [phone] [phone]   Clinic Follow up Note   Patient Care Team: Marga Hoots, NP as PCP - General (Adult Health Nurse Practitioner) Lorretta Harp, MD as PCP - Cardiology (Cardiology) [name], MD as Consulting Physician (General Surgery) [name], [name], MD as Consulting Physician (Gastroenterology) [name], MD as Consulting Physician (Hematology) Lorretta Harp, MD as Consulting Physician (Cardiology)  Date of Service:  10/09/2022  CHIEF COMPLAINT:   f/u of cholangiocarcinoma   CURRENT THERAPY:  Stivarga, q28d, starting 05/15/22 -dose: 80 mg daily d1-21  ASSESSMENT:  Brandon Sherman is a 76 y.o. male with   1. Intrahepatic cholangiocarcinoma, cT2N0Mx, unresectable, with indeterminate lung nodules, IDH mutation (+) -Diagnosed in 07/2019. CT scans and MRI liver show a large 7.3cm mass in the right hepatic lobe which abuts portal vein. Felt to be not resectable due to the invasion to portal vein. -s/p first line Cisplatin and Gemcitabine, 09/29/19 - 07/19/20. Cisplatin discontinued 03/23/20 due to Afib. -His FO results showed MSI stable disease, IDH1 mutations he may benefit from IDH inhibitor  -he progressed through single agent gemcitabine (12/24/20 - 03/16/21), FOLFOX plus durvalumab (04/05/21 - 10/21/21), FOLFIRI (11/01/21 - 01/19/22), and then tried ivosidenib (02/02/22 - 04/2022) -due to disease progression, we switched to [place] on 05/15/22. He tolerated low-dose 80 mg daily well, but developed multiple side effects on 120 mg daily. He is tolerating 31m for 3 weeks on, 1 week off with no noticeable side effects. -restaging abdomen MRI and chest CT on 08/09/22 showed overall stable to mildly progressed disease, plan to continue stirvaga, given limited treatment options left. -labs reviewed, overall stable, hgb 11.6, plt 106k. Will continue stirvaga at same dose given his good tolerance. 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   SUMMARY OF ONCOLOGIC HISTORY: Oncology History Overview Note  Cancer Staging Intrahepatic cholangiocarcinoma ([facility] Staging form: Intrahepatic Bile Duct, AJCC 8th Edition - Clinical stage from 08/19/2019: Stage II (cT2, cN0, cM0) - Signed by FTruitt Merle MD on 08/19/2019    Intrahepatic cholangiocarcinoma ([place]  06/28/2019 Imaging   CT AP W Contrast 06/28/19  IMPRESSION: 1. Heterogeneous hypodensity posteriorly in the right hepatic lobe and potentially extending into the caudate lobe suspicious for a mass. There is felt to be truncation of branches of the portal vein in this vicinity and some narrowing of the hepatic vein, as well as triangular-shaped regions of abnormal hypoenhancement posteriorly in the right hepatic lobe likely representing downstream vascular effects. Cannot exclude malignancy such as cholangiocarcinoma or hepatocellular carcinoma, and follow up hepatic protocol MRI with and without contrast is recommended to further characterize. 2. 4 mm right middle lobe pulmonary nodule is likely benign but may merit surveillance. 3. Cholelithiasis. 4.  Aortic Atherosclerosis (ICD10-I70.0). 5. Prostatomegaly. 6. Mild impingement at L3-4 and L4-5.   07/31/2019 Imaging   MRI Liver 07/31/19 IMPRESSION: 1. 7.3 cm in long axis mass in the right hepatic lobe spanning into the caudate lobe, high suspicion for malignancy such as hepatocellular carcinoma or cholangiocarcinoma. Suspected effacement or occlusion of the right hepatic vein and posterior branches of the right portal vein. Two smaller tumor nodules along the  posterior periphery of the dominant mass. Tissue diagnosis is recommended. 2. No findings of  pathologic adenopathy or distant metastatic spread. 3. 9 mm gallstone in the gallbladder. There is mild gallbladder wall thickening which may be from nondistention, correlate clinically in assessing for cholecystitis. 4.  Aortic Atherosclerosis (ICD10-I70.0). 5. Mild diffuse hepatic steatosis.   08/11/2019 Initial Biopsy   DIAGNOSIS: 08/11/19  A. LIVER, RIGHT, BIOPSY:  - Adenocarcinoma.   08/18/2019 Imaging   CT Chest 08/18/19  IMPRESSION: 1. Multiple pulmonary nodules largest at approximately 7 mm in the right lower lobe, nonspecific but concerning given findings in the liver. 2. No signs of definitive metastatic disease, also with mildly enlarged upper abdominal lymph nodes as discussed.   Aortic Atherosclerosis (ICD10-I70.0).   08/19/2019 Initial Diagnosis   Intrahepatic cholangiocarcinoma ([place])   08/19/2019 Cancer Staging   Staging form: Intrahepatic Bile Duct, AJCC 8th Edition - Clinical stage from 08/19/2019: Stage II (cT2, cN0, cM0) - Signed by [name], MD on 08/19/2019   09/29/2019 - 07/19/2020 Chemotherapy   Cisplatin and Gemcitabine 2 weeks on/1 week off starting 09/29/19. Cisplatin held from cycle 9 (03/23/20) due to fluid status/Afib. He is now on maintenance Gemcitabine. Held after 07/19/20 to proceed with liver target therapy.     10/09/2019 Imaging   CT AP IMPRESSION: 1. The dominant right hepatic lobe mass is minimally reduced in size compared to prior exams, currently measuring 6.2 by 5.3 cm, previously 6.2 by 5.6 cm. However, there is a new small hypodense lesion centrally in the right hepatic lobe which is suspicious for a new small focus of tumor. Accordingly this is an overall mixed appearance. 2. Continued hypoenhancement in the liver downstream of the tumor likely attributable to narrowing or occlusion of the right hepatic vein by the tumor. By virtue of its location the tumor wraps around the intrahepatic portion of the IVC. 3. 4 mm right middle lobe pulmonary  nodule, stable compared to earliest available comparison of 07/18/2019. Surveillance of the patient's pulmonary nodules is recommended. 4. Other imaging findings of potential clinical significance: Coronary atherosclerosis. Cholelithiasis. Prominent stool throughout the colon favors constipation. Moderate prostatomegaly with heterogeneous enhancement of the prostate gland. Lumbar spondylosis and degenerative disc disease causing mild bilateral foraminal impingement at L3-4 and L4-5.   Aortic Atherosclerosis (ICD10-I70.0).   12/24/2019 Imaging   CT CAP W Contrast  IMPRESSION: 1. Right hepatic lobe mass and adjacent right hepatic lobe nodules appear grossly stable. No evidence of distant metastatic disease. 2. Continued stability of small pulmonary nodules. Recommend attention on follow-up. 3. Cholelithiasis. 4. Enlarged prostate. 5. Aortic atherosclerosis (ICD10-I70.0). Coronary artery calcification.   02/23/2020 Procedure   He had PAC placed on 02/23/20.    04/08/2020 Imaging   CT CAP  IMPRESSION: 1. Stable or minimally decreased size of a very ill-defined, hypodense and somewhat retractile appearing mass of the central right lobe of the liver abutting the inferior vena cava and right portal vein, measuring approximately 5.7 x 5.0 cm, previously 6.2 x 5.2 cm when measured similarly. Findings are consistent with stable or minimally improved cholangiocarcinoma. 2. Small hypodense nodules of the right lobe identified on prior examination are poorly appreciated on this single phase contrast examination although not grossly changed. Attention on follow-up. 3. There are new, moderate bilateral pleural effusions and associated atelectasis or consolidation as well as a new small pericardial effusion, nonspecific although generally concerning and suspicious for malignant effusions. There is no directly visualized pleural nodularity. 4. Multiple small pulmonary nodules are stable.  No new  nodules. 5. Coronary artery disease. Aortic Atherosclerosis (ICD10-I70.0).   07/06/2020 Imaging   MRI ABD IMPRESSION: 1. Interval decrease in size of the right hepatic lobe lesion in the medial aspect of the segment 7. Findings would suggest a good response to treatment with some contraction of the tumor. 2. No new hepatic lesions. No abdominal adenopathy or metastatic disease. 3. Stable mild intrahepatic biliary dilatation in the right hepatic lobe distal to the lesion. 4. Somewhat tortuous and almost beaded appearance of the hepatic and portal vein radicles. Findings could be radiation related. 5. Cholelithiasis.  CT chest wo contrast IMPRESSION: 1. Persistent/stable moderate-sized bilateral pleural effusions with overlying atelectasis. 2. Stable small right pulmonary nodules. No new or progressive findings. Recommend continued surveillance. 3. No mediastinal or hilar mass or adenopathy. 4. Stable advanced three-vessel coronary artery calcifications. 5. Cholelithiasis. Aortic Atherosclerosis (ICD10-I70.0)   08/11/2020 Procedure   Y90 on 08/11/20 and 08/26/20 with Dr Laurence Ferrari    11/30/2020 Imaging   MRI Abdomen  IMPRESSION: 1. Today's study demonstrates progression of disease with enlarging central lesion in the right lobe of the liver involving portions of segments 7 and 8, now with evidence of some tumor thrombus extension into the intrahepatic portion of the inferior vena cava. This is associated with increasing intrahepatic biliary ductal dilatation in segment 7 of the liver. 2. In addition, there is some subtle hyperenhancement of the distal common bile duct at and immediately proximally to the level of the ampulla. There is also some subtle delayed enhancement around this region in the pancreatic head. This is of uncertain etiology and significance, but may be inflammatory and currently is not associated with proximal common bile duct dilatation. However, close attention on  follow-up imaging is recommended. 3. Cholelithiasis without evidence of acute cholecystitis at this time. 4. Aortic atherosclerosis.     12/24/2020 - 03/21/2021 Chemotherapy   Restart Gemcitabine 2 weeks on/1 week off given disease progression beginning 12/24/20. Discontinued after 03/21/21 due to disease progression in liver.     03/16/2021 Imaging   MRI abdomen  IMPRESSION: No significant change in size of irregular hypovascular mass in the medial right hepatic lobe.   New 1.1 cm hypovascular lesion with peripheral rim enhancement in liver dome, suspicious for metastatic disease.   New small right pleural effusion and mild ascites.   Cholelithiasis, without evidence of cholecystitis or biliary dilatation.   04/05/2021 -  Chemotherapy   Second-line FOLFOX q2weeks starting 04/05/21    04/05/2021 -  Chemotherapy   Immunotherapy with Durvalumab (Imfinzi) q4weeks starting 04/05/21. (in addition to second line FOLFOX)   06/23/2021 Imaging   MRI Abdomen IMPRESSION: 1. No significant change in subcapsular mass of the posterior right lobe of the liver. 2. No significant change in an additional small hyperenhancing lesion of the liver dome, which remains suspicious for a satellite lesion. 3. No new liver lesions. 4. 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 5. Cholelithiasis without evidence of acute cholecystitis. 6. Trace ascites.  CT Chest w/o contrast IMPRESSION: 1. Occasional small pulmonary nodules in the right lung are stable, for example a 6 mm nodule right lower lobe and a 3 mm nodule of the lateral segment right middle lobe. These are nonspecific and likely benign, incidental sequelae of infection or inflammation, metastatic disease not favored. Attention on follow-up. 2. Previously seen bilateral pleural effusions are resolved. 3. Hypodense lesion of the posterior right lobe of the liver, keeping with known cholangiocarcinoma and better characterized by same day  MR.  4. Coronary artery disease.   10/21/2021 Imaging   EXAM: MRI ABDOMEN WITHOUT AND WITH CONTRAST  IMPRESSION: 1. Mild progression of dominant right hepatic lobe mass with involvement of the right portal, hepatic veins and minimal extension into the IVC. 2. Hepatic dome satellite lesion, new or more conspicuous today. 3. Cholelithiasis 4. Decrease in trace perihepatic and perisplenic ascites. 5.  Aortic Atherosclerosis (ICD10-I70.0).   10/21/2021 Imaging   EXAM: CT CHEST WITHOUT CONTRAST  IMPRESSION: 1. Since 06/23/2021, enlargement of a right lower lobe and possible enlargement of a right middle lobe pulmonary nodule. Findings are suspicious for metastatic disease. 2. No thoracic adenopathy. 3. Coronary artery atherosclerosis. Aortic Atherosclerosis (ICD10-I70.0).   11/01/2021 - 01/11/2022 Chemotherapy   Patient is on Treatment Plan : PANCREAS FOLFIRI q14d     01/19/2022 Imaging   EXAM: CT CHEST WITHOUT CONTRAST  IMPRESSION: 1. Continued increase in size of lung nodule within the posterior right lung base which now measures 1 cm and is suspicious for metastatic disease. The remaining lung nodules are stable from 10/21/2021. 2. No thoracic adenopathy. 3. Aortic Atherosclerosis (ICD10-I70.0). Coronary artery calcifications.   01/19/2022 Imaging   EXAM: MRI ABDOMEN WITHOUT AND WITH CONTRAST  IMPRESSION: 1. No significant change in size or appearance of the large heterogeneous enhancing mass area in the right hepatic lobe as described. There is mild interval enlargement of the adjacent satellite lesion near the dome of the right lobe now measuring 1.9 cm. 2. Redemonstration of a pulmonary nodule in the right lower lobe measuring 9 mm. 3. Small volume ascites. 4. Splenomegaly. 5. Cholelithiasis.      INTERVAL HISTORY:  Brandon Sherman is here for a follow up of cholangiocarcinoma. He was last seen by me on 09/11/2022. He presents to the clinic accompanied by his  wife. He reports he is doing well overall, continues doing yard work. He denies diarrhea or bloating.   All other systems were reviewed with the patient and are negative.  MEDICAL HISTORY:  Past Medical History:  Diagnosis Date   Arthritis    Diabetes ([place])    GERD (gastroesophageal reflux disease)    Hyperlipidemia    Hypertension    Intrahepatic cholangiocarcinoma ([place])     SURGICAL HISTORY: Past Surgical History:  Procedure Laterality Date   APPENDECTOMY  1980   CARDIOVERSION N/A 04/27/2020   Procedure: CARDIOVERSION;  Surgeon: Dorothy Spark, MD;  Location: [facility];  Service: Cardiovascular;  Laterality: N/A;   CARDIOVERSION N/A 05/19/2020   Procedure: CARDIOVERSION;  Surgeon: Elouise Munroe, MD;  Location: [facility];  Service: Cardiovascular;  Laterality: N/A;   COLONOSCOPY     IR 3D INDEPENDENT WKST  08/11/2020   IR 3D INDEPENDENT WKST  08/11/2020   IR ANGIOGRAM SELECTIVE EACH ADDITIONAL VESSEL  08/11/2020   IR ANGIOGRAM SELECTIVE EACH ADDITIONAL VESSEL  08/11/2020   IR ANGIOGRAM SELECTIVE EACH ADDITIONAL VESSEL  08/11/2020   IR ANGIOGRAM SELECTIVE EACH ADDITIONAL VESSEL  08/11/2020   IR ANGIOGRAM SELECTIVE EACH ADDITIONAL VESSEL  08/11/2020   IR ANGIOGRAM SELECTIVE EACH ADDITIONAL VESSEL  08/11/2020   IR ANGIOGRAM SELECTIVE EACH ADDITIONAL VESSEL  08/26/2020   IR ANGIOGRAM SELECTIVE EACH ADDITIONAL VESSEL  08/26/2020   IR ANGIOGRAM VISCERAL SELECTIVE  08/11/2020   IR ANGIOGRAM VISCERAL SELECTIVE  08/26/2020   IR EMBO ARTERIAL NOT HEMORR HEMANG INC GUIDE ROADMAPPING  08/11/2020   IR EMBO TUMOR ORGAN ISCHEMIA INFARCT INC GUIDE ROADMAPPING  08/26/2020   IR IMAGING GUIDED PORT INSERTION  02/23/2020   IR  RADIOLOGIST EVAL & MGMT  07/14/2020   IR RADIOLOGIST EVAL & MGMT  09/30/2020   IR RADIOLOGIST EVAL & MGMT  12/01/2020   IR US GUIDE VASC ACCESS LEFT  08/26/2020   IR US GUIDE VASC ACCESS RIGHT  08/11/2020    I have reviewed the social history and family history with the  patient and they are unchanged from previous note.  ALLERGIES:  has No Known Allergies.  MEDICATIONS:  Current Outpatient Medications  Medication Sig Dispense Refill   allopurinol (ZYLOPRIM) 100 MG tablet Take 100 mg by mouth daily.     apixaban (ELIQUIS) 5 MG TABS tablet TAKE 1 TABLET TWICE DAILY 180 tablet 1   atorvastatin (LIPITOR) 40 MG tablet Take 1 tablet (40 mg total) by mouth daily. 90 tablet 3   colchicine 0.6 MG tablet Take 1 tablet (0.6 mg total) by mouth daily as needed (gout flare). Take daily for 3 days, then as needed for gout flare 30 tablet 0   fenofibrate (TRICOR) 145 MG tablet Take 145 mg by mouth daily.     finasteride (PROSCAR) 5 MG tablet Take 5 mg by mouth daily.     furosemide (LASIX) 40 MG tablet TAKE 2 TABLETS BY MOUTH 2 TIMES DAILY. 360 tablet 3   gabapentin (NEURONTIN) 100 MG capsule Take 100 mg by mouth at bedtime.     hydrALAZINE (APRESOLINE) 50 MG tablet Take 1.5 tablets (75 mg total) by mouth 3 (three) times daily. 135 tablet 3   KLOR-CON M20 20 MEQ tablet TAKE 1 TABLET BY MOUTH TWICE A DAY 180 tablet 3   lidocaine-prilocaine (EMLA) cream Apply 1 application topically as needed. 30 g 3   metFORMIN (GLUCOPHAGE-XR) 500 MG 24 hr tablet      metoprolol succinate (TOPROL-XL) 50 MG 24 hr tablet Take 1 tablet (50 mg total) by mouth daily. Take with or immediately following a meal. 90 tablet 3   olmesartan (BENICAR) 40 MG tablet Take 1 tablet (40 mg total) by mouth daily. 90 tablet 3   ondansetron (ZOFRAN) 8 MG tablet Take 1 tablet (8 mg total) by mouth every 8 (eight) hours as needed for nausea or vomiting. 30 tablet 1   pantoprazole (PROTONIX) 40 MG tablet TAKE 1 TABLET BY MOUTH TWICE A DAY 180 tablet 2   regorafenib (STIVARGA) 40 MG tablet Take 2 tablets (80 mg total) by mouth daily. Take for 3 weeks then off for one week. 42 tablet 2   sucralfate (CARAFATE) 1 g tablet Take 1 tablet (1 g total) by mouth every 6 (six) hours as needed. Please schedule a yearly follow  up for further refills: [phone] 60 tablet 0   tamsulosin (FLOMAX) 0.4 MG CAPS capsule Take 0.4 mg by mouth daily.     No current facility-administered medications for this visit.    PHYSICAL EXAMINATION: ECOG PERFORMANCE STATUS: 1 - Symptomatic but completely ambulatory  Vitals:   10/09/22 0900  BP: (!) 145/72  Pulse: 72  Resp: 18  Temp: 97.8 F (36.6 C)  SpO2: 100%   Wt Readings from Last 3 Encounters:  10/09/22 210 lb (95.3 kg)  09/11/22 202 lb 8 oz (91.9 kg)  08/14/22 202 lb 12.8 oz (92 kg)     GENERAL:alert, no distress and comfortable SKIN: skin color, texture, turgor are normal, no rashes or significant lesions EYES: normal, Conjunctiva are pink and non-injected, sclera clear NECK: supple, thyroid normal size, non-tender, without nodularity LYMPH:  no palpable lymphadenopathy in the cervical, axillary  LUNGS: clear to  auscultation and percussion with normal breathing effort HEART: regular rate & rhythm and no murmurs and no lower extremity edema ABDOMEN:abdomen soft, distended, non-tender and normal bowel sounds Musculoskeletal:no cyanosis of digits and no clubbing  NEURO: alert & oriented x 3 with fluent speech, no focal motor/sensory deficits  LABORATORY DATA:  I have reviewed the data as listed    Latest Ref Rng & Units 10/09/2022    8:43 AM 09/11/2022    1:04 PM 08/14/2022   11:04 AM  CBC  WBC 4.0 - 10.5 K/uL 4.2  4.9  4.9   Hemoglobin 13.0 - 17.0 g/dL 11.6  12.1  12.0   Hematocrit 39.0 - 52.0 % 34.8  37.0  36.5   Platelets 150 - 400 K/uL 106  114  106         Latest Ref Rng & Units 10/09/2022    8:43 AM 09/11/2022    1:04 PM 08/14/2022   11:04 AM  CMP  Glucose 70 - 99 mg/dL 123  127  160   BUN 8 - 23 mg/dL _0 Creatinine 0.61 - 1.24 mg/dL 1.17  1.05  1.25   Sodium 135 - 145 mmol/L 139  137  137   Potassium 3.5 - 5.1 mmol/L 3.7  3.7  3.4   Chloride 98 - 111 mmol/L 107  109  107   CO2 22 - 32 mmol/L _1 Calcium 8.9 - 10.3  mg/dL 9.1  8.9  8.8   Total Protein 6.5 - 8.1 g/dL 7.1  7.0  6.8   Total Bilirubin 0.3 - 1.2 mg/dL 0.9  0.7  0.7   Alkaline Phos 38 - 126 U/L 65  64  56   AST 15 - 41 U/L 39  41  39   ALT 0 - 44 U/L 29  33  33       RADIOGRAPHIC STUDIES: I have personally reviewed the radiological images as listed and agreed with the findings in the report. No results found.    Orders Placed This Encounter  Procedures   CT CHEST WO CONTRAST    Standing Status:   Future    Standing Expiration Date:   10/10/2023    Order Specific Question:   Preferred imaging location?    Answer:   [facility]    Order Specific Question:   Release to patient    Answer:   Immediate   MR Abdomen W Wo Contrast    Standing Status:   Future    Standing Expiration Date:   10/10/2023    Order Specific Question:   If indicated for the ordered procedure, I authorize the administration of contrast media per Radiology protocol    Answer:   Yes    Order Specific Question:   What is the patient's sedation requirement?    Answer:   No Sedation    Order Specific Question:   Does the patient have a pacemaker or implanted devices?    Answer:   No    Order Specific Question:   Preferred imaging location?    Answer:   [facility] (table limit - 550 lbs)   All questions were answered. The patient knows to call the clinic with any problems, questions or concerns. No barriers to learning was detected. The total time spent in the appointment was 30 minutes.     [name], MD 10/09/2022   I, Wilburn Mylar, am acting as  scribe for [name], MD.   I have reviewed the above documentation for accuracy and completeness, and I agree with the above.

## 2022-10-10 ENCOUNTER — Telehealth: Payer: Self-pay | Admitting: Hematology

## 2022-10-10 NOTE — Telephone Encounter (Signed)
Called patient to inform of upcoming appointment times. Left voicemail.

## 2022-10-19 ENCOUNTER — Other Ambulatory Visit (HOSPITAL_COMMUNITY): Payer: Self-pay

## 2022-10-20 ENCOUNTER — Other Ambulatory Visit (HOSPITAL_COMMUNITY): Payer: Self-pay

## 2022-10-24 ENCOUNTER — Other Ambulatory Visit: Payer: Self-pay

## 2022-10-24 ENCOUNTER — Other Ambulatory Visit (HOSPITAL_COMMUNITY): Payer: Self-pay

## 2022-11-15 ENCOUNTER — Other Ambulatory Visit (HOSPITAL_COMMUNITY): Payer: Self-pay

## 2022-11-15 ENCOUNTER — Other Ambulatory Visit: Payer: Self-pay | Admitting: Hematology

## 2022-11-15 DIAGNOSIS — C221 Intrahepatic bile duct carcinoma: Secondary | ICD-10-CM

## 2022-11-15 MED ORDER — STIVARGA 40 MG PO TABS
80.0000 mg | ORAL_TABLET | Freq: Every day | ORAL | 2 refills | Status: DC
  Filled 2022-11-15: qty 42, 21d supply, fill #0

## 2022-11-21 ENCOUNTER — Ambulatory Visit (HOSPITAL_COMMUNITY)
Admission: RE | Admit: 2022-11-21 | Discharge: 2022-11-21 | Disposition: A | Discharge status: Home / Self Care | Payer: Medicare HMO | Source: Ambulatory Visit | Attending: Hematology | Admitting: Hematology

## 2022-11-21 ENCOUNTER — Other Ambulatory Visit (HOSPITAL_COMMUNITY): Payer: Self-pay

## 2022-11-21 ENCOUNTER — Other Ambulatory Visit: Payer: Self-pay

## 2022-11-21 DIAGNOSIS — K3189 Other diseases of stomach and duodenum: Secondary | ICD-10-CM | POA: Diagnosis not present

## 2022-11-21 DIAGNOSIS — C221 Intrahepatic bile duct carcinoma: Secondary | ICD-10-CM

## 2022-11-21 DIAGNOSIS — R918 Other nonspecific abnormal finding of lung field: Secondary | ICD-10-CM | POA: Diagnosis not present

## 2022-11-21 DIAGNOSIS — K6389 Other specified diseases of intestine: Secondary | ICD-10-CM | POA: Diagnosis not present

## 2022-11-21 DIAGNOSIS — K8020 Calculus of gallbladder without cholecystitis without obstruction: Secondary | ICD-10-CM | POA: Diagnosis not present

## 2022-11-21 MED ORDER — GADOBUTROL 1 MMOL/ML IV SOLN
10.0000 mL | Freq: Once | INTRAVENOUS | Status: AC | PRN
  Administered 2022-11-21: 10 mL via INTRAVENOUS

## 2022-11-22 NOTE — Assessment & Plan Note (Signed)
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 -s/p first line Cisplatin and Gemcitabine, 09/29/19 - 07/19/20. Cisplatin discontinued 03/23/20 due to Afib. -he progressed through single agent gemcitabine (12/24/20 - 03/16/21), FOLFOX plus durvalumab (04/05/21 - 10/21/21), FOLFIRI (11/01/21 - 01/19/22), and then tried ivosidenib (02/02/22 - 04/2022) -he was switched to [place] on 05/15/22. He tolerated low-dose 80 mg daily well, but developed multiple side effects on 120 mg daily. He is tolerating 18m for 3 weeks on, 1 week off with no noticeable side effects. -restaging abdomen MRI and chest CT on 08/09/22 showed overall stable to mildly progressed disease, plan to continue stirvaga, given limited treatment options left.

## 2022-11-23 ENCOUNTER — Encounter: Payer: Self-pay | Admitting: Hematology

## 2022-11-23 ENCOUNTER — Other Ambulatory Visit: Payer: Self-pay

## 2022-11-23 ENCOUNTER — Inpatient Hospital Stay: Payer: Medicare HMO | Attending: Hematology | Admitting: Hematology

## 2022-11-23 ENCOUNTER — Inpatient Hospital Stay: Payer: Medicare HMO

## 2022-11-23 ENCOUNTER — Other Ambulatory Visit (HOSPITAL_COMMUNITY): Payer: Self-pay

## 2022-11-23 ENCOUNTER — Telehealth: Payer: Self-pay | Admitting: Pharmacy Technician

## 2022-11-23 VITALS — BP 149/77 | HR 75 | Temp 98.4°F | Resp 18 | Ht 69.0 in | Wt 215.9 lb

## 2022-11-23 DIAGNOSIS — Z66 Do not resuscitate: Secondary | ICD-10-CM | POA: Diagnosis not present

## 2022-11-23 DIAGNOSIS — R5383 Other fatigue: Secondary | ICD-10-CM | POA: Diagnosis not present

## 2022-11-23 DIAGNOSIS — R112 Nausea with vomiting, unspecified: Secondary | ICD-10-CM | POA: Diagnosis not present

## 2022-11-23 DIAGNOSIS — C7800 Secondary malignant neoplasm of unspecified lung: Secondary | ICD-10-CM | POA: Diagnosis not present

## 2022-11-23 DIAGNOSIS — C221 Intrahepatic bile duct carcinoma: Secondary | ICD-10-CM

## 2022-11-23 DIAGNOSIS — Z79899 Other long term (current) drug therapy: Secondary | ICD-10-CM | POA: Insufficient documentation

## 2022-11-23 DIAGNOSIS — Z95828 Presence of other vascular implants and grafts: Secondary | ICD-10-CM

## 2022-11-23 DIAGNOSIS — E860 Dehydration: Secondary | ICD-10-CM | POA: Insufficient documentation

## 2022-11-23 DIAGNOSIS — Z7189 Other specified counseling: Secondary | ICD-10-CM | POA: Diagnosis not present

## 2022-11-23 DIAGNOSIS — Z5112 Encounter for antineoplastic immunotherapy: Secondary | ICD-10-CM | POA: Insufficient documentation

## 2022-11-23 DIAGNOSIS — R140 Abdominal distension (gaseous): Secondary | ICD-10-CM | POA: Insufficient documentation

## 2022-11-23 LAB — COMPREHENSIVE METABOLIC PANEL
ALT: 38 U/L (ref 0–44)
AST: 60 U/L — ABNORMAL HIGH (ref 15–41)
Albumin: 3.5 g/dL (ref 3.5–5.0)
Alkaline Phosphatase: 81 U/L (ref 38–126)
Anion gap: 7 (ref 5–15)
BUN: 15 mg/dL (ref 8–23)
CO2: 24 mmol/L (ref 22–32)
Calcium: 8.6 mg/dL — ABNORMAL LOW (ref 8.9–10.3)
Chloride: 109 mmol/L (ref 98–111)
Creatinine, Ser: 1.25 mg/dL — ABNORMAL HIGH (ref 0.61–1.24)
GFR, Estimated: >60 mL/min (ref >60)
Glucose, Bld: 84 mg/dL (ref 70–99)
Potassium: 3.1 mmol/L — ABNORMAL LOW (ref 3.5–5.1)
Sodium: 140 mmol/L (ref 135–145)
Total Bilirubin: 1 mg/dL (ref 0.3–1.2)
Total Protein: 6.4 g/dL — ABNORMAL LOW (ref 6.5–8.1)

## 2022-11-23 LAB — CBC WITH DIFFERENTIAL/PLATELET
Abs Immature Granulocytes: 0.01 10*3/uL (ref 0.00–0.07)
Basophils Absolute: 0.1 10*3/uL (ref 0.0–0.1)
Basophils Relative: 1 %
Eosinophils Absolute: 0.1 10*3/uL (ref 0.0–0.5)
Eosinophils Relative: 1 %
HCT: 36.2 % — ABNORMAL LOW (ref 39.0–52.0)
Hemoglobin: 12.3 g/dL — ABNORMAL LOW (ref 13.0–17.0)
Immature Granulocytes: 0 %
Lymphocytes Relative: 19 %
Lymphs Abs: 1 10*3/uL (ref 0.7–4.0)
MCH: 30.1 pg (ref 26.0–34.0)
MCHC: 34 g/dL (ref 30.0–36.0)
MCV: 88.7 fL (ref 80.0–100.0)
Monocytes Absolute: 0.5 10*3/uL (ref 0.1–1.0)
Monocytes Relative: 10 %
Neutro Abs: 3.8 10*3/uL (ref 1.7–7.7)
Neutrophils Relative %: 69 %
Platelets: 111 10*3/uL — ABNORMAL LOW (ref 150–400)
RBC: 4.08 MIL/uL — ABNORMAL LOW (ref 4.22–5.81)
RDW: 17.4 % — ABNORMAL HIGH (ref 11.5–15.5)
WBC: 5.5 10*3/uL (ref 4.0–10.5)
nRBC: 0 % (ref 0.0–0.2)

## 2022-11-23 MED ORDER — HEPARIN SOD (PORK) LOCK FLUSH 100 UNIT/ML IV SOLN
500.0000 [IU] | Freq: Once | INTRAVENOUS | Status: AC
  Administered 2022-11-23: 500 [IU]

## 2022-11-23 MED ORDER — SODIUM CHLORIDE 0.9% FLUSH
10.0000 mL | Freq: Once | INTRAVENOUS | Status: AC
  Administered 2022-11-23: 10 mL

## 2022-11-23 MED ORDER — LENVATINIB (20 MG DAILY DOSE) 2 X 10 MG PO CPPK
20.0000 mg | ORAL_CAPSULE | Freq: Every day | ORAL | 0 refills | Status: DC
  Filled 2022-11-23 – 2022-11-27 (×3): qty 60, 30d supply, fill #0

## 2022-11-23 NOTE — Progress Notes (Signed)
DISCONTINUE OFF PATHWAY REGIMEN - Other   [id number]:FOLFIRI (Leucovorin IV D1 + Fluorouracil IV D1/CIV D1,2 + Irinotecan IV D1) q14 Days:   A cycle is every 14 days:     Irinotecan      Leucovorin      Fluorouracil      Fluorouracil   **Always confirm dose/schedule in your pharmacy ordering system**  REASON: Disease Progression PRIOR TREATMENT: FOLFIRI (Leucovorin IV D1 + Fluorouracil IV D1/CIV D1,2 + Irinotecan IV D1) q14 Days TREATMENT RESPONSE: Unable to Evaluate  START OFF PATHWAY REGIMEN - Other   [id number]:Lenvatinib 20 mg PO Daily D1-21 + Pembrolizumab 200 mg IV D1 q21 Days:   A cycle is every 21 days:     Lenvatinib      Pembrolizumab   **Always confirm dose/schedule in your pharmacy ordering system**  Patient Characteristics: Intent of Therapy: Non-Curative / Palliative Intent, Discussed with Patient

## 2022-11-23 NOTE — Progress Notes (Signed)
[place]   [phone] [phone]   Clinic Follow up Note   Patient Care Team: Marga Hoots, NP as PCP - General (Adult Health Nurse Practitioner) Lorretta Harp, MD as PCP - Cardiology (Cardiology) [name], MD as Consulting Physician (General Surgery) [name], [name], MD as Consulting Physician (Gastroenterology) [name], MD as Consulting Physician (Hematology) Lorretta Harp, MD as Consulting Physician (Cardiology)  Date of Service:  11/23/2022  CHIEF COMPLAINT: f/u of cholangiocarcinoma    CURRENT THERAPY:  Stivarga, q28d, starting 05/15/22 -dose: 80 mg daily d1-21   ASSESSMENT:  Brandon Sherman is a 77 y.o. 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 -s/p first line Cisplatin and Gemcitabine, 09/29/19 - 07/19/20. Cisplatin discontinued 03/23/20 due to Afib. -he progressed through single agent gemcitabine (12/24/20 - 03/16/21), FOLFOX plus durvalumab (04/05/21 - 10/21/21), FOLFIRI (11/01/21 - 01/19/22), and then tried ivosidenib (02/02/22 - 04/2022) -he was switched to [place] on 05/15/22. He tolerated low-dose 80 mg daily well, but developed multiple side effects on 120 mg daily. He is tolerating 34m for 3 weeks on, 1 week off with no noticeable side effects. -restaging abdomen MRI and chest CT on 08/09/22 showed overall stable to mildly progressed disease, he continued stirvaga given limited treatment options left. -he is clinically stable, no pain or other complains -I personally reviewed the images of restaging CT chest and abdominal MRI from yesterday which unfortunately showed disease progression in liver and lungs  -I discussed changing treatment.  I recommend Lenvatinib and Keytruda based the phase 2 study and NCCN guide line.  I discussed the potential side  effects, especially hypertension, gastric discomfort, diarrhea, thyroid dysfunction and other endocrine disorder, and other autoimmune related side effects.  Patient voiced good understanding and agrees to proceed. -I also obtained a Guardant360 if he has any new genomic alterations, if he is a candidate for targeted therapy. -Discussed clinical trials opeion, he is not interested.  Goal of care, DNR  --We previously discussed the incurable nature of his cancer, and the overall poor prognosis, especially if he does not have good response to chemotherapy or progress on chemo -The patient understands the goal of care is palliative. -I recommended DNR/DNI and he agreed in 01/2022.     PLAN: -Discuss Ct Scan findings, he has disease progression  -lab reviewed  -Guardant 360 done today  - Discuss limited treatment option. -I will call in lenvatinib 20 mg daily, he will start when he receives it.   -I [facility] insurance approval [facility]u [facility]-3 weeks  SUMMARY OF ONCOLOGIC HISTORY: Oncology History Overview Note  Cancer Staging Intrahepatic cholangiocarcinoma ([place] Staging form: Intrahepatic Bile Duct, AJCC 8th Edition - Clinical stage from 08/19/2019: Stage II (cT2, cN0, cM0) - Signed by FTruitt Merle MD on 08/19/2019    Int[place]atic [place]angiocarcinoma ([place]  06/28/2019 Imaging   CT AP W Contrast 06/28/19  IMPRESSION: 1. Heterogeneous hypodensity posteriorly in the right hepatic lobe and potentially extending into the caudate lobe suspicious for a mass. There is felt to be truncation of branches of the portal vein in this vicinity and some narrowing of the hepatic vein, as well as triangular-shaped regions of abnormal hypoenhancement posteriorly in the right hepatic lobe likely representing downstream vascular effects. Cannot exclude malignancy such as cholangiocarcinoma or hepatocellular carcinoma, and follow  up hepatic protocol MRI with and without contrast is  recommended to further characterize. 2. 4 mm right middle lobe pulmonary nodule is likely benign but may merit surveillance. 3. Cholelithiasis. 4.  Aortic Atherosclerosis (ICD10-I70.0). 5. Prostatomegaly. 6. Mild impingement at L3-4 and L4-5.   07/31/2019 Imaging   MRI Liver 07/31/19 IMPRESSION: 1. 7.3 cm in long axis mass in the right hepatic lobe spanning into the caudate lobe, high suspicion for malignancy such as hepatocellular carcinoma or cholangiocarcinoma. Suspected effacement or occlusion of the right hepatic vein and posterior branches of the right portal vein. Two smaller tumor nodules along the posterior periphery of the dominant mass. Tissue diagnosis is recommended. 2. No findings of pathologic adenopathy or distant metastatic spread. 3. 9 mm gallstone in the gallbladder. There is mild gallbladder wall thickening which may be from nondistention, correlate clinically in assessing for cholecystitis. 4.  Aortic Atherosclerosis (ICD10-I70.0). 5. Mild diffuse hepatic steatosis.   08/11/2019 Initial Biopsy   DIAGNOSIS: 08/11/19  A. LIVER, RIGHT, BIOPSY:  - Adenocarcinoma.   08/18/2019 Imaging   CT Chest 08/18/19  IMPRESSION: 1. Multiple pulmonary nodules largest at approximately 7 mm in the right lower lobe, nonspecific but concerning given findings in the liver. 2. No signs of definitive metastatic disease, also with mildly enlarged upper abdominal lymph nodes as discussed.   Aortic Atherosclerosis (ICD10-I70.0).   08/19/2019 Initial Diagnosis   Intrahepatic cholangiocarcinoma ([place])   08/19/2019 Cancer Staging   Staging form: Intrahepatic Bile Duct, AJCC 8th Edition - Clinical stage from 08/19/2019: Stage II (cT2, cN0, cM0) - Signed by [name], MD on 08/19/2019   09/29/2019 - 07/19/2020 Chemotherapy   Cisplatin and Gemcitabine 2 weeks on/1 week off starting 09/29/19. Cisplatin held from cycle 9 (03/23/20) due to fluid status/Afib. He is now on maintenance Gemcitabine. Held  after 07/19/20 to proceed with liver target therapy.     10/09/2019 Imaging   CT AP IMPRESSION: 1. The dominant right hepatic lobe mass is minimally reduced in size compared to prior exams, currently measuring 6.2 by 5.3 cm, previously 6.2 by 5.6 cm. However, there is a new small hypodense lesion centrally in the right hepatic lobe which is suspicious for a new small focus of tumor. Accordingly this is an overall mixed appearance. 2. Continued hypoenhancement in the liver downstream of the tumor likely attributable to narrowing or occlusion of the right hepatic vein by the tumor. By virtue of its location the tumor wraps around the intrahepatic portion of the IVC. 3. 4 mm right middle lobe pulmonary nodule, stable compared to earliest available comparison of 07/18/2019. Surveillance of the patient's pulmonary nodules is recommended. 4. Other imaging findings of potential clinical significance: Coronary atherosclerosis. Cholelithiasis. Prominent stool throughout the colon favors constipation. Moderate prostatomegaly with heterogeneous enhancement of the prostate gland. Lumbar spondylosis and degenerative disc disease causing mild bilateral foraminal impingement at L3-4 and L4-5.   Aortic Atherosclerosis (ICD10-I70.0).   12/24/2019 Imaging   CT CAP W Contrast  IMPRESSION: 1. Right hepatic lobe mass and adjacent right hepatic lobe nodules appear grossly stable. No evidence of distant metastatic disease. 2. Continued stability of small pulmonary nodules. Recommend attention on follow-up. 3. Cholelithiasis. 4. Enlarged prostate. 5. Aortic atherosclerosis (ICD10-I70.0). Coronary artery calcification.   02/23/2020 Procedure   He had PAC placed on 02/23/20.    04/08/2020 Imaging   CT CAP  IMPRESSION: 1. Stable or minimally decreased size of a very ill-defined, hypodense and somewhat retractile appearing mass of the central right lobe of the liver abutting  the inferior vena cava and right portal  vein, measuring approximately 5.7 x 5.0 cm, previously 6.2 x 5.2 cm when measured similarly. Findings are consistent with stable or minimally improved cholangiocarcinoma. 2. Small hypodense nodules of the right lobe identified on prior examination are poorly appreciated on this single phase contrast examination although not grossly changed. Attention on follow-up. 3. There are new, moderate bilateral pleural effusions and associated atelectasis or consolidation as well as a new small pericardial effusion, nonspecific although generally concerning and suspicious for malignant effusions. There is no directly visualized pleural nodularity. 4. Multiple small pulmonary nodules are stable.  No new nodules. 5. Coronary artery disease. Aortic Atherosclerosis (ICD10-I70.0).   07/06/2020 Imaging   MRI ABD IMPRESSION: 1. Interval decrease in size of the right hepatic lobe lesion in the medial aspect of the segment 7. Findings would suggest a good response to treatment with some contraction of the tumor. 2. No new hepatic lesions. No abdominal adenopathy or metastatic disease. 3. Stable mild intrahepatic biliary dilatation in the right hepatic lobe distal to the lesion. 4. Somewhat tortuous and almost beaded appearance of the hepatic and portal vein radicles. Findings could be radiation related. 5. Cholelithiasis.  CT chest wo contrast IMPRESSION: 1. Persistent/stable moderate-sized bilateral pleural effusions with overlying atelectasis. 2. Stable small right pulmonary nodules. No new or progressive findings. Recommend continued surveillance. 3. No mediastinal or hilar mass or adenopathy. 4. Stable advanced three-vessel coronary artery calcifications. 5. Cholelithiasis. Aortic Atherosclerosis (ICD10-I70.0)   08/11/2020 Procedure   Y90 on 08/11/20 and 08/26/20 with Dr Laurence Ferrari    11/30/2020 Imaging   MRI Abdomen  IMPRESSION: 1. Today's study demonstrates progression of disease with  enlarging central lesion in the right lobe of the liver involving portions of segments 7 and 8, now with evidence of some tumor thrombus extension into the intrahepatic portion of the inferior vena cava. This is associated with increasing intrahepatic biliary ductal dilatation in segment 7 of the liver. 2. In addition, there is some subtle hyperenhancement of the distal common bile duct at and immediately proximally to the level of the ampulla. There is also some subtle delayed enhancement around this region in the pancreatic head. This is of uncertain etiology and significance, but may be inflammatory and currently is not associated with proximal common bile duct dilatation. However, close attention on follow-up imaging is recommended. 3. Cholelithiasis without evidence of acute cholecystitis at this time. 4. Aortic atherosclerosis.     12/24/2020 - 03/21/2021 Chemotherapy   Restart Gemcitabine 2 weeks on/1 week off given disease progression beginning 12/24/20. Discontinued after 03/21/21 due to disease progression in liver.     03/16/2021 Imaging   MRI abdomen  IMPRESSION: No significant change in size of irregular hypovascular mass in the medial right hepatic lobe.   New 1.1 cm hypovascular lesion with peripheral rim enhancement in liver dome, suspicious for metastatic disease.   New small right pleural effusion and mild ascites.   Cholelithiasis, without evidence of cholecystitis or biliary dilatation.   04/05/2021 -  Chemotherapy   Second-line FOLFOX q2weeks starting 04/05/21    04/05/2021 -  Chemotherapy   Immunotherapy with Durvalumab (Imfinzi) q4weeks starting 04/05/21. (in addition to second line FOLFOX)   06/23/2021 Imaging   MRI Abdomen IMPRESSION: 1. No significant change in subcapsular mass of the posterior right lobe of the liver. 2. No significant change in an additional small hyperenhancing lesion of the liver dome, which remains suspicious for a satellite lesion. 3. No  new liver lesions. 4.  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 5. Cholelithiasis without evidence of acute cholecystitis. 6. Trace ascites.  CT Chest w/o contrast IMPRESSION: 1. Occasional small pulmonary nodules in the right lung are stable, for example a 6 mm nodule right lower lobe and a 3 mm nodule of the lateral segment right middle lobe. These are nonspecific and likely benign, incidental sequelae of infection or inflammation, metastatic disease not favored. Attention on follow-up. 2. Previously seen bilateral pleural effusions are resolved. 3. Hypodense lesion of the posterior right lobe of the liver, keeping with known cholangiocarcinoma and better characterized by same day MR. 4. Coronary artery disease.   10/21/2021 Imaging   EXAM: MRI ABDOMEN WITHOUT AND WITH CONTRAST  IMPRESSION: 1. Mild progression of dominant right hepatic lobe mass with involvement of the right portal, hepatic veins and minimal extension into the IVC. 2. Hepatic dome satellite lesion, new or more conspicuous today. 3. Cholelithiasis 4. Decrease in trace perihepatic and perisplenic ascites. 5.  Aortic Atherosclerosis (ICD10-I70.0).   10/21/2021 Imaging   EXAM: CT CHEST WITHOUT CONTRAST  IMPRESSION: 1. Since 06/23/2021, enlargement of a right lower lobe and possible enlargement of a right middle lobe pulmonary nodule. Findings are suspicious for metastatic disease. 2. No thoracic adenopathy. 3. Coronary artery atherosclerosis. Aortic Atherosclerosis (ICD10-I70.0).   11/01/2021 - 01/11/2022 Chemotherapy   Patient is on Treatment Plan : PANCREAS FOLFIRI q14d     01/19/2022 Imaging   EXAM: CT CHEST WITHOUT CONTRAST  IMPRESSION: 1. Continued increase in size of lung nodule within the posterior right lung base which now measures 1 cm and is suspicious for metastatic disease. The remaining lung nodules are stable from 10/21/2021. 2. No thoracic adenopathy. 3. Aortic  Atherosclerosis (ICD10-I70.0). Coronary artery calcifications.   01/19/2022 Imaging   EXAM: MRI ABDOMEN WITHOUT AND WITH CONTRAST  IMPRESSION: 1. No significant change in size or appearance of the large heterogeneous enhancing mass area in the right hepatic lobe as described. There is mild interval enlargement of the adjacent satellite lesion near the dome of the right lobe now measuring 1.9 cm. 2. Redemonstration of a pulmonary nodule in the right lower lobe measuring 9 mm. 3. Small volume ascites. 4. Splenomegaly. 5. Cholelithiasis.      INTERVAL HISTORY:  Brandon Sherman is here for a follow up of cholangiocarcinoma   He was last seen by me on 10/09/22 He presents to the clinic accompanied by wife. Pt states he is doing well but has some fatigue.     All other systems were reviewed with the patient and are negative.  MEDICAL HISTORY:  Past Medical History:  Diagnosis Date   Arthritis    Diabetes ([place])    GERD (gastroesophageal reflux disease)    Hyperlipidemia    Hypertension    Intrahepatic cholangiocarcinoma ([place])     SURGICAL HISTORY: Past Surgical History:  Procedure Laterality Date   APPENDECTOMY  1980   CARDIOVERSION N/A 04/27/2020   Procedure: CARDIOVERSION;  Surgeon: Dorothy Spark, MD;  [facility];  Service: Cardiovascular;  Laterality: N/A;   CARDIOVERSION N/A 05/19/2020   Procedure: CARDIOVERSION;  Surgeon: Elouise Munroe, MD;  Location: [facility];  Service: Cardiovascular;  Laterality: N/A;   COLONOSCOPY     IR 3D INDEPENDENT WKST  08/11/2020   IR 3D INDEPENDENT WKST  08/11/2020   IR ANGIOGRAM SELECTIVE EACH ADDITIONAL VESSEL  08/11/2020   IR ANGIOGRAM SELECTIVE EACH ADDITIONAL VESSEL  08/11/2020   IR ANGIOGRAM SELECTIVE EACH ADDITIONAL VESSEL  08/11/2020   IR  ANGIOGRAM SELECTIVE EACH ADDITIONAL VESSEL  08/11/2020   IR ANGIOGRAM SELECTIVE EACH ADDITIONAL VESSEL  08/11/2020   IR ANGIOGRAM SELECTIVE EACH ADDITIONAL VESSEL  08/11/2020    IR ANGIOGRAM SELECTIVE EACH ADDITIONAL VESSEL  08/26/2020   IR ANGIOGRAM SELECTIVE EACH ADDITIONAL VESSEL  08/26/2020   IR ANGIOGRAM VISCERAL SELECTIVE  08/11/2020   IR ANGIOGRAM VISCERAL SELECTIVE  08/26/2020   IR EMBO ARTERIAL NOT HEMORR HEMANG INC GUIDE ROADMAPPING  08/11/2020   IR EMBO TUMOR ORGAN ISCHEMIA INFARCT INC GUIDE ROADMAPPING  08/26/2020   IR IMAGING GUIDED PORT INSERTION  02/23/2020   IR RADIOLOGIST EVAL & MGMT  07/14/2020   IR RADIOLOGIST EVAL & MGMT  09/30/2020   IR RADIOLOGIST EVAL & MGMT  12/01/2020   IR US GUIDE VASC ACCESS LEFT  08/26/2020   IR US GUIDE VASC ACCESS RIGHT  08/11/2020    I have reviewed the social history and family history with the patient and they are unchanged from previous note.  ALLERGIES:  has No Known Allergies.  MEDICATIONS:  Current Outpatient Medications  Medication Sig Dispense Refill   allopurinol (ZYLOPRIM) 100 MG tablet Take 100 mg by mouth daily.     apixaban (ELIQUIS) 5 MG TABS tablet TAKE 1 TABLET TWICE DAILY 180 tablet 1   atorvastatin (LIPITOR) 40 MG tablet Take 1 tablet (40 mg total) by mouth daily. 90 tablet 3   colchicine 0.6 MG tablet Take 1 tablet (0.6 mg total) by mouth daily as needed (gout flare). Take daily for 3 days, then as needed for gout flare 30 tablet 0   fenofibrate (TRICOR) 145 MG tablet Take 145 mg by mouth daily.     finasteride (PROSCAR) 5 MG tablet Take 5 mg by mouth daily.     furosemide (LASIX) 40 MG tablet TAKE 2 TABLETS BY MOUTH 2 TIMES DAILY. 360 tablet 3   gabapentin (NEURONTIN) 100 MG capsule Take 100 mg by mouth at bedtime.     hydrALAZINE (APRESOLINE) 50 MG tablet Take 1.5 tablets (75 mg total) by mouth 3 (three) times daily. 135 tablet 3   KLOR-CON M20 20 MEQ tablet TAKE 1 TABLET BY MOUTH TWICE A DAY 180 tablet 3   lidocaine-prilocaine (EMLA) cream Apply 1 application topically as needed. 30 g 3   metFORMIN (GLUCOPHAGE-XR) 500 MG 24 hr tablet      metoprolol succinate (TOPROL-XL) 50 MG 24 hr tablet Take 1  tablet (50 mg total) by mouth daily. Take with or immediately following a meal. 90 tablet 3   olmesartan (BENICAR) 40 MG tablet Take 1 tablet (40 mg total) by mouth daily. 90 tablet 3   ondansetron (ZOFRAN) 8 MG tablet Take 1 tablet (8 mg total) by mouth every 8 (eight) hours as needed for nausea or vomiting. 30 tablet 1   pantoprazole (PROTONIX) 40 MG tablet TAKE 1 TABLET BY MOUTH TWICE A DAY 180 tablet 2   regorafenib (STIVARGA) 40 MG tablet Take 2 tablets (80 mg total) by mouth daily. Take for 3 weeks then off for one week. 42 tablet 2   sucralfate (CARAFATE) 1 g tablet Take 1 tablet (1 g total) by mouth every 6 (six) hours as needed. Please schedule a yearly follow up for further refills: [phone] 60 tablet 0   tamsulosin (FLOMAX) 0.4 MG CAPS capsule Take 0.4 mg by mouth daily.     No current facility-administered medications for this visit.    PHYSICAL EXAMINATION: ECOG PERFORMANCE STATUS: 1 - Symptomatic but completely ambulatory  Vitals:   11/23/22  1157  BP: (!) 149/77  Pulse: 75  Resp: 18  Temp: 98.4 F (36.9 C)  SpO2: 97%   Wt Readings from Last 3 Encounters:  11/23/22 215 lb 14.4 oz (97.9 kg)  10/09/22 210 lb (95.3 kg)  09/11/22 202 lb 8 oz (91.9 kg)     GENERAL:alert, no distress and comfortable SKIN: skin color normal, no rashes or significant lesions EYES: normal, Conjunctiva are pink and non-injected, sclera clear  NEURO: alert & oriented x 3 with fluent speech  LABORATORY DATA:  I have reviewed the data as listed    Latest Ref Rng & Units 11/23/2022   11:17 AM 10/09/2022    8:43 AM 09/11/2022    1:04 PM  CBC  WBC 4.0 - 10.5 K/uL 5.5  4.2  4.9   Hemoglobin 13.0 - 17.0 g/dL 12.3  11.6  12.1   Hematocrit 39.0 - 52.0 % 36.2  34.8  37.0   Platelets 150 - 400 K/uL 111  106  114         Latest Ref Rng & Units 11/23/2022   11:17 AM 10/09/2022    8:43 AM 09/11/2022    1:04 PM  CMP  Glucose 70 - 99 mg/dL 84  123  127   BUN 8 - 23 mg/dL _0 Creatinine 0.61 - 1.24 mg/dL 1.25  1.17  1.05   Sodium 135 - 145 mmol/L 140  139  137   Potassium 3.5 - 5.1 mmol/L 3.1  3.7  3.7   Chloride 98 - 111 mmol/L 109  107  109   CO2 22 - 32 mmol/L _1 Calcium 8.9 - 10.3 mg/dL 8.6  9.1  8.9   Total Protein 6.5 - 8.1 g/dL 6.4  7.1  7.0   Total Bilirubin 0.3 - 1.2 mg/dL 1.0  0.9  0.7   Alkaline Phos 38 - 126 U/L 81  65  64   AST 15 - 41 U/L 60  39  41   ALT 0 - 44 U/L 38  29  33       RADIOGRAPHIC STUDIES: I have personally reviewed the radiological images as listed and agreed with the findings in the report. No results found.    Orders Placed This Encounter  Procedures   Guardant 360    Standing Status:   Future    Number of Occurrences:   1    Standing Expiration Date:   11/23/2023   All questions were answered. The patient knows to call the clinic with any problems, questions or concerns. No barriers to learning was detected. The total time spent in the appointment was 40 minutes.     [name], MD 11/23/2022   Felicity Coyer, CMA, am acting as scribe for [name], MD.   I have reviewed the above documentation for accuracy and completeness, and I agree with the above.

## 2022-11-23 NOTE — Assessment & Plan Note (Signed)
-  We previously discussed the incurable nature of his cancer, and the overall poor prognosis, especially if he does not have good response to chemotherapy or progress on chemo -The patient understands the goal of care is palliative. -I recommended DNR/DNI and he agreed in 01/2022.

## 2022-11-23 NOTE — Telephone Encounter (Signed)
Oral Oncology Patient Advocate Encounter   Was successful in securing patient an $3,250 grant from Patient [facility]) to provide copayment coverage for Stivarga.  This will keep the out of pocket expense at $0.     I have spoken with the patient.    The billing information is as follows and has been shared with [place].   Member ID: [id number] Group ID: [id number] RxBin: [id number] Dates of Eligibility: 10/26/21 through 01/24/23  Fund:  Berle Mull, [place] Patient [place] Direct Number: [phone]  Fax: [phone]

## 2022-11-24 ENCOUNTER — Other Ambulatory Visit (HOSPITAL_COMMUNITY): Payer: Self-pay

## 2022-11-24 ENCOUNTER — Telehealth: Payer: Self-pay | Admitting: Hematology

## 2022-11-24 ENCOUNTER — Telehealth: Payer: Self-pay | Admitting: Pharmacy Technician

## 2022-11-24 ENCOUNTER — Other Ambulatory Visit: Payer: Self-pay

## 2022-11-24 ENCOUNTER — Telehealth: Payer: Self-pay | Admitting: Pharmacist

## 2022-11-24 NOTE — Telephone Encounter (Signed)
Patient aware of upcoming appointments  

## 2022-11-24 NOTE — Telephone Encounter (Signed)
Oral Oncology Patient Advocate Encounter   Received notification that prior authorization for Lenvima is required.   PA submitted on 11/24/22 Key [id number] Status is pending     Lady Deutscher, CPhT-Adv Oncology Pharmacy Patient [place] Direct Number: [phone]  Fax: [phone]

## 2022-11-24 NOTE — Telephone Encounter (Addendum)
Oral Oncology Pharmacist Encounter  Received new prescription for Lenvima (lenvatinib) for the treatment of intrahepatic cholangiocarcinoma in conjunction with pembrolizumab, planned duration until disease progression or unacceptable drug toxicity.  CMP and CBC w/ Diff from 11/23/22 assessed, noted Scr of 1.25 mg/dL (CrCl ~72 mL/min) - no baseline dose adjustments required. Patient with AST slightly above ULN, but ALT and t.bili WNL. No hepatic dose adjustments required. BP of 149/77 mmHg on 11/23/22 - recommend close monitoring as Lenvima can cause HTN. Prescription dose and frequency assessed for appropriateness. Appropriate for therapy initiation.   Current medication list in Epic reviewed, DDIs with Lenvima identified: Category D drug-drug interaction between Salix and Ondansetron due to risk of Qtc prolongation with concomitant use of both agents. Would recommend alternative anti-emetic PRN if patient has nausea while on therapy.  Evaluated chart and no patient barriers to medication adherence noted.   Patient agreement for treatment documented in MD note on 11/23/22.  Prescription has been e-scribed to the [facility] for benefits analysis and approval.  Oral Oncology Clinic will continue to follow for insurance authorization, copayment issues, initial counseling and start date.  Leron Croak, PharmD, BCPS, [facility] Hematology/Oncology Clinical Pharmacist [place] and [place] [phone] 11/24/2022 9:29 AM

## 2022-11-24 NOTE — Telephone Encounter (Signed)
Oral Oncology Patient Advocate Encounter  Prior Authorization for [employer] has been approved.    PA# [id number] Effective dates: 11/24/22 through 05/25/23  Patients co-pay is $384.62.    Lady Deutscher, CPhT-Adv Oncology Pharmacy Patient [place] Direct Number: [phone]  Fax: [phone]

## 2022-11-26 ENCOUNTER — Other Ambulatory Visit: Payer: Self-pay

## 2022-11-27 ENCOUNTER — Other Ambulatory Visit: Payer: Self-pay

## 2022-11-27 ENCOUNTER — Other Ambulatory Visit (HOSPITAL_COMMUNITY): Payer: Self-pay

## 2022-11-27 NOTE — Telephone Encounter (Signed)
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   Counseled on administration, dosing, side effects, monitoring, drug-food interactions, safe handling, storage, and disposal.  Patient will take Lenvima '10mg'$  capsules, 2 capsules ('20mg'$ ) by mouth once daily, with or without food, at approximately the same time each day.  Lenvima start date: 11/29/22  Adverse effects include but are not limited to: hypertension, hand-foot syndrome, diarrhea, joint pain, fatigue, headache, and change in electrolytes.  Hand-foot syndrome: discussed use of cream such as [place] Smooth Extra Care 20 or equivalent advanced care cream that has 20% urea content for advanced skin hydration while on Xeloda Diarrhea: Patient will obtain Imodium (loperamide) to have on hand if they experience diarrhea. Patient knows to alert the office of 4 or more loose stools above baseline.  Patient's wife instructed to notify office of any upcoming invasive procedures. Brandon Sherman will be held for 6 days prior to scheduled surgery, restart based on healing and clinical judgement.   Reviewed importance of keeping a medication schedule and plan for any missed doses. No barriers to medication adherence identified.  Medication reconciliation performed and medication/allergy list updated.  All questions answered. Brandon Sherman voiced understanding and appreciation.   Medication education handout placed in mail for patient and patient's wife. Patient and patient's wife know to call the office with questions or concerns. Oral Chemotherapy Clinic phone number provided.   Leron Croak, PharmD, BCPS, BCOP Hematology/Oncology Clinical Pharmacist [facility] [place] [phone] 11/27/2022 11:21 AM

## 2022-11-28 ENCOUNTER — Other Ambulatory Visit: Payer: Self-pay

## 2022-11-28 ENCOUNTER — Telehealth: Payer: Self-pay

## 2022-11-28 MED ORDER — POTASSIUM CHLORIDE CRYS ER 20 MEQ PO TBCR: 20.0000 meq | EXTENDED_RELEASE_TABLET | Freq: Every day | ORAL | 0 refills | Status: DC

## 2022-11-28 NOTE — Telephone Encounter (Signed)
I spoke with the pt Brandon Sherman.Kisling per  Dr. Burr Medico to let him know that Potassium is low and that a prescription was called in for him to take 20 meq  daily for one week. Mr. Brandon Sherman got on the phone and gave me permission to relay the message to his wife of about his K.

## 2022-11-29 ENCOUNTER — Encounter: Payer: Self-pay | Admitting: Cardiovascular Disease

## 2022-11-29 ENCOUNTER — Ambulatory Visit: Payer: Medicare HMO | Attending: Cardiovascular Disease | Admitting: Cardiovascular Disease

## 2022-11-29 VITALS — BP 120/70 | HR 61 | Ht 69.0 in | Wt 221.0 lb

## 2022-11-29 DIAGNOSIS — I5031 Acute diastolic (congestive) heart failure: Secondary | ICD-10-CM

## 2022-11-29 DIAGNOSIS — R0989 Other specified symptoms and signs involving the circulatory and respiratory systems: Secondary | ICD-10-CM | POA: Diagnosis not present

## 2022-11-29 DIAGNOSIS — I10 Essential (primary) hypertension: Secondary | ICD-10-CM

## 2022-11-29 DIAGNOSIS — E785 Hyperlipidemia, unspecified: Secondary | ICD-10-CM

## 2022-11-29 DIAGNOSIS — I2510 Atherosclerotic heart disease of native coronary artery without angina pectoris: Secondary | ICD-10-CM

## 2022-11-29 DIAGNOSIS — I480 Paroxysmal atrial fibrillation: Secondary | ICD-10-CM | POA: Diagnosis not present

## 2022-11-29 NOTE — Assessment & Plan Note (Signed)
History of diastolic dysfunction echo performed 04/21/2021 revealing normal LV systolic function without evidence of significant valvular abnormalities.  He does have 1-2+ pitting edema and some abdominal bloating.  He was on furosemide daily but his oncologist apparently has changed to twice a week.  I have asked him to address this with his oncologist.  I suspect he needs to go back on daily.  Semi-.

## 2022-11-29 NOTE — Progress Notes (Signed)
11/29/2022 Brandon Sherman   [birth year]  [id number]  Primary Physician Brandon Hoots, NP Primary Cardiologist: Brandon Harp MD Brandon Sherman, [place]  HPI:  Brandon Sherman is a 77 y.o.  moderately overweight married Caucasian male father of 63, grandfather of 5 grandchildren who is retired from working at [employer] for 52 years .  His wife Brandon Sherman is also a patient of mine, and accompanies him today.  He was referred by Ihor Dow, PA for atrial fibrillation.  I last saw him in the office 06/20/2022. His risk factors include treated hypertension, diabetes and hyperlipidemia.  Both his mother and sister had known CAD.  He is never had a heart attack or stroke.  He unfortunately was diagnosed with liver cancer back in September of last year and is begun on chemotherapy since that time.  A Port-A-Cath was placed 2 months ago which time he was noted to be in A. fib and he was started on Eliquis 3 weeks ago by his PCP.  He has complained of increased dyspnea on exertion over the last 6 months but denies chest pain.  He does not have history of heavy alcohol abuse which was discontinued at the time of his liver cancer diagnosis.   After being on appropriate doses of Eliquis for at least 4 weeks he underwent successful outpatient cardioversion by Dr. Meda Coffee 04/27/2020.  He apparently went back in A. fib a week later.  Does complain of some dyspnea on exertion.  He has normal LV function by 2D echo and a negative Myoview stress test.  He does have hypertension with blood pressures in the 170 range.   Since I saw him in the office 6 months ago he continues to do well.  He does continue to have a Port-A-Cath pain and is getting chemotherapy.  Apparently his liver cancer has spread.  He denies chest pain or shortness of breath.  On exam he appears to be in sinus rhythm as he was in his last EKG.  He remains on Eliquis oral anticoagulation PAF.     Current Meds  Medication Sig   allopurinol  (ZYLOPRIM) 100 MG tablet Take 100 mg by mouth daily.   apixaban (ELIQUIS) 5 MG TABS tablet TAKE 1 TABLET TWICE DAILY   atorvastatin (LIPITOR) 40 MG tablet Take 1 tablet (40 mg total) by mouth daily.   fenofibrate (TRICOR) 145 MG tablet Take 145 mg by mouth daily.   finasteride (PROSCAR) 5 MG tablet Take 5 mg by mouth daily.   furosemide (LASIX) 40 MG tablet TAKE 2 TABLETS BY MOUTH 2 TIMES DAILY.   gabapentin (NEURONTIN) 100 MG capsule Take 100 mg by mouth at bedtime.   hydrALAZINE (APRESOLINE) 50 MG tablet Take 1.5 tablets (75 mg total) by mouth 3 (three) times daily.   lenvatinib 20 mg daily dose (LENVIMA) 2 x 10 MG capsule Take 2 capsules (20 mg total) by mouth daily.   lidocaine-prilocaine (EMLA) cream Apply 1 application topically as needed.   metFORMIN (GLUCOPHAGE-XR) 500 MG 24 hr tablet    metoprolol succinate (TOPROL-XL) 50 MG 24 hr tablet Take 1 tablet (50 mg total) by mouth daily. Take with or immediately following a meal.   olmesartan (BENICAR) 40 MG tablet Take 1 tablet (40 mg total) by mouth daily.   ondansetron (ZOFRAN) 8 MG tablet Take 1 tablet (8 mg total) by mouth every 8 (eight) hours as needed for nausea or vomiting.   pantoprazole (PROTONIX) 40 MG tablet  TAKE 1 TABLET BY MOUTH TWICE A DAY   potassium chloride SA (KLOR-CON M20) 20 MEQ tablet Take 1 tablet (20 mEq total) by mouth daily.   sucralfate (CARAFATE) 1 g tablet Take 1 tablet (1 g total) by mouth every 6 (six) hours as needed. Please schedule a yearly follow up for further refills: [phone]   tamsulosin (FLOMAX) 0.4 MG CAPS capsule Take 0.4 mg by mouth daily.     No Known Allergies  Social History   Socioeconomic History   Marital status: Married    Spouse name: Not on file   Number of children: 3   Years of education: Not on file   Highest education level: Not on file  Occupational History   Not on file  Tobacco Use   Smoking status: Never   Smokeless tobacco: Current    Types: Chew   Tobacco  comments:    for 60 years, 1-2 daily   Vaping Use   Vaping Use: Never used  Substance and Sexual Activity   Alcohol use: Not Currently    Comment: occasionally   Drug use: Never   Sexual activity: Not on file  Other Topics Concern   Not on file  Social History Narrative   Not on file   Social Determinants of Health   Financial Resource Strain: Not on file  Food Insecurity: Not on file  Transportation Needs: No Transportation Needs (05/21/2020)   PRAPARE - Hydrologist (Medical): No    Lack of Transportation (Non-Medical): No  Physical Activity: Insufficiently Active (05/21/2020)   Exercise Vital Sign    Days of Exercise per Week: 1 day    Minutes of Exercise per Session: 50 min  Stress: Not on file  Social Connections: Unknown (05/21/2020)   Social Connection and Isolation Panel [NHANES]    Frequency of Communication with Friends and Family: More than three times a week    Frequency of Social Gatherings with Friends and Family: More than three times a week    Attends Religious Services: Not on Advertising copywriter or Organizations: Not on file    Attends Archivist Meetings: Not on file    Marital Status: Married  Human resources officer Violence: Not on file     Review of Systems: General: negative for chills, fever, night sweats or weight changes.  Cardiovascular: negative for chest pain, dyspnea on exertion, edema, orthopnea, palpitations, paroxysmal nocturnal dyspnea or shortness of breath Dermatological: negative for rash Respiratory: negative for cough or wheezing Urologic: negative for hematuria Abdominal: negative for nausea, vomiting, diarrhea, bright red blood per rectum, melena, or hematemesis Neurologic: negative for visual changes, syncope, or dizziness All other systems reviewed and are otherwise negative except as noted above.    Blood pressure 120/70, pulse 61, height '5\' 9"'$  (1.753 m), weight 221 lb (100.2 kg).   General appearance: alert and no distress Neck: no adenopathy, no JVD, supple, symmetrical, trachea midline, thyroid not enlarged, symmetric, no tenderness/mass/nodules, and right carotid bruit Lungs: clear to auscultation bilaterally Heart: regular rate and rhythm, S1, S2 normal, no murmur, click, rub or gallop Extremities: 1-2+ pitting lower extremity edema. Pulses: 2+ and symmetric Skin: Skin color, texture, turgor normal. No rashes or lesions Neurologic: Grossly normal  EKG not performed today  ASSESSMENT AND PLAN:   Dyslipidemia, goal LDL below 70 History of dyslipidemia on statin therapy with lipid profile performed 09/28/2022 revealing total cholesterol 108, LDL of 56 and HDL 31.  Essential hypertension History of essential hypertension on hydralazine and olmesartan.  I reviewed his blood pressure log which over the last week or 2 has shown blood pressures within normal range.  Atrial fibrillation ([place]) Totally ridiculous history of PAF currently in sinus rhythm on Eliquis oral anticoagulation.  Coronary artery calcification seen on CT scan History of coronary calcification with a coronary calcium score performed 04/13/2020 of 931 with subsequent negative Myoview stress test.  Patient denies chest pain pain.  Acute diastolic CHF (congestive heart failure) (HCC) History of diastolic dysfunction echo performed 04/21/2021 revealing normal LV systolic function without evidence of significant valvular abnormalities.  He does have 1-2+ pitting edema and some abdominal bloating.  He was on furosemide daily but his oncologist apparently has changed to twice a week.  I have asked him to address this with his oncologist.  I suspect he needs to go back on daily.  Semi-.     Brandon Harp MD FACP,FACC,FAHA, [facility] 11/29/2022 9:14 AM

## 2022-11-29 NOTE — Patient Instructions (Signed)
   Testing/Procedures:  Your physician has requested that you have a carotid duplex. This test is an ultrasound of the carotid arteries in your neck. It looks at blood flow through these arteries that supply the brain with blood. Allow one hour for this exam. There are no restrictions or special instructions. [facility]   Follow-Up: At [facility], you and your health needs are our priority.  As part of our continuing mission to provide you with exceptional heart care, we have created designated Provider Care Teams.  These Care Teams include your primary Cardiologist (physician) and Advanced Practice Providers (APPs -  Physician Assistants and Nurse Practitioners) who all work together to provide you with the care you need, when you need it.  We recommend signing up for the patient portal called "MyChart".  Sign up information is provided on this After Visit Summary.  MyChart is used to connect with patients for Virtual Visits (Telemedicine).  Patients are able to view lab/test results, encounter notes, upcoming appointments, etc.  Non-urgent messages can be sent to your provider as well.   To learn more about what you can do with MyChart, go to [email or web address].    Your next appointment:   6 month(s)  The format for your next appointment:   In Person  Provider:   Coletta Memos, FNP, Sande Rives, PA-C, Caron Presume, PA-C, Jory Sims, DNP, ANP, [name], PA-C, or Diona Browner, NP    Then, Quay Burow, MD will plan to see you again in 12 month(s).

## 2022-11-29 NOTE — Assessment & Plan Note (Signed)
History of essential hypertension on hydralazine and olmesartan.  I reviewed his blood pressure log which over the last week or 2 has shown blood pressures within normal range.

## 2022-11-29 NOTE — Assessment & Plan Note (Signed)
Totally ridiculous history of PAF currently in sinus rhythm on Eliquis oral anticoagulation.

## 2022-11-29 NOTE — Assessment & Plan Note (Signed)
History of dyslipidemia on statin therapy with lipid profile performed 09/28/2022 revealing total cholesterol 108, LDL of 56 and HDL 31.

## 2022-11-29 NOTE — Assessment & Plan Note (Signed)
History of coronary calcification with a coronary calcium score performed 04/13/2020 of 931 with subsequent negative Myoview stress test.  Patient denies chest pain pain.

## 2022-11-30 DIAGNOSIS — C221 Intrahepatic bile duct carcinoma: Secondary | ICD-10-CM | POA: Diagnosis not present

## 2022-12-01 NOTE — Progress Notes (Signed)
Pharmacist Chemotherapy Monitoring - Initial Assessment    Anticipated start date: 12/08/22   The following has been reviewed per standard work regarding the patient's treatment regimen: The patient's diagnosis, treatment plan and drug doses, and organ/hematologic function Lab orders and baseline tests specific to treatment regimen  The treatment plan start date, drug sequencing, and pre-medications Prior authorization status  Patient's documented medication list, including drug-drug interaction screen and prescriptions for anti-emetics and supportive care specific to the treatment regimen The drug concentrations, fluid compatibility, administration routes, and timing of the medications to be used The patient's access for treatment and lifetime cumulative dose history, if applicable  The patient's medication allergies and previous infusion related reactions, if applicable   Changes made to treatment plan:  treatment plan date  Follow up needed:  N/A   Judge Stall, [place], 12/01/2022  8:14 AM

## 2022-12-05 ENCOUNTER — Ambulatory Visit (HOSPITAL_COMMUNITY)
Admission: RE | Admit: 2022-12-05 | Discharge: 2022-12-05 | Disposition: A | Discharge status: Home / Self Care | Payer: Medicare HMO | Source: Ambulatory Visit | Attending: Cardiovascular Disease | Admitting: Cardiovascular Disease

## 2022-12-05 ENCOUNTER — Encounter: Payer: Self-pay | Admitting: Hematology

## 2022-12-05 DIAGNOSIS — R0989 Other specified symptoms and signs involving the circulatory and respiratory systems: Secondary | ICD-10-CM | POA: Diagnosis not present

## 2022-12-06 LAB — GUARDANT 360

## 2022-12-07 NOTE — Progress Notes (Deleted)
[facility]   Telephone:(336) [phone] [phone]   Clinic Follow up Note   Patient Care Team: Marga Hoots, NP as PCP - General (Adult Health Nurse Practitioner) Lorretta Harp, MD as PCP - Cardiology (Cardiology) [name], MD as Consulting Physician (General Surgery) [name], [name], MD as Consulting Physician (Gastroenterology) [name], MD as Consulting Physician (Hematology) Lorretta Harp, MD as Consulting Physician (Cardiology)  Date of Service:  12/07/2022  CHIEF COMPLAINT: f/u of cholangiocarcinoma   CURRENT THERAPY:  Stivarga, q28d, starting 05/15/22 -dose: 80 mg daily d1-21   ASSESSMENT: *** Brandon Sherman is a 77 y.o. male with   No problem-specific Assessment & Plan notes found for this encounter.  ***   PLAN: {Everything Dr. Burr Medico talks to pt about, including reviewing scans and labs. } -{proceed with ***} -{lab with/without flush and f/u when?}   SUMMARY OF ONCOLOGIC HISTORY: Oncology History Overview Note  Cancer Staging Intrahepatic cholangiocarcinoma ([place]) Staging form: Intrahepatic Bile Duct, AJCC 8th Edition - Clinical stage from 08/19/2019: Stage II (cT2, cN0, cM0) - Signed by [name], MD on 08/19/2019    Intrahepatic cholangiocarcinoma ([place])  06/28/2019 Imaging   CT AP W Contrast 06/28/19  IMPRESSION: 1. Heterogeneous hypodensity posteriorly in the right hepatic lobe and potentially extending into the caudate lobe suspicious for a mass. There is felt to be truncation of branches of the portal vein in this vicinity and some narrowing of the hepatic vein, as well as triangular-shaped regions of abnormal hypoenhancement posteriorly in the right hepatic lobe likely representing downstream vascular effects. Cannot exclude malignancy such as cholangiocarcinoma or hepatocellular carcinoma, and follow up hepatic protocol MRI with and without contrast is recommended to further characterize. 2. 4 mm right  middle lobe pulmonary nodule is likely benign but may merit surveillance. 3. Cholelithiasis. 4.  Aortic Atherosclerosis (ICD10-I70.0). 5. Prostatomegaly. 6. Mild impingement at L3-4 and L4-5.   07/31/2019 Imaging   MRI Liver 07/31/19 IMPRESSION: 1. 7.3 cm in long axis mass in the right hepatic lobe spanning into the caudate lobe, high suspicion for malignancy such as hepatocellular carcinoma or cholangiocarcinoma. Suspected effacement or occlusion of the right hepatic vein and posterior branches of the right portal vein. Two smaller tumor nodules along the posterior periphery of the dominant mass. Tissue diagnosis is recommended. 2. No findings of pathologic adenopathy or distant metastatic spread. 3. 9 mm gallstone in the gallbladder. There is mild gallbladder wall thickening which may be from nondistention, correlate clinically in assessing for cholecystitis. 4.  Aortic Atherosclerosis (ICD10-I70.0). 5. Mild diffuse hepatic steatosis.   08/11/2019 Initial Biopsy   DIAGNOSIS: 08/11/19  A. LIVER, RIGHT, BIOPSY:  - Adenocarcinoma.   08/18/2019 Imaging   CT Chest 08/18/19  IMPRESSION: 1. Multiple pulmonary nodules largest at approximately 7 mm in the right lower lobe, nonspecific but concerning given findings in the liver. 2. No signs of definitive metastatic disease, also with mildly enlarged upper abdominal lymph nodes as discussed.   Aortic Atherosclerosis (ICD10-I70.0).   08/19/2019 Initial Diagnosis   Intrahepatic cholangiocarcinoma ([place])   08/19/2019 Cancer Staging   Staging form: Intrahepatic Bile Duct, AJCC 8th Edition - Clinical stage from 08/19/2019: Stage II (cT2, cN0, cM0) - Signed by [name], MD on 08/19/2019   09/29/2019 - 07/19/2020 Chemotherapy   Cisplatin and Gemcitabine 2 weeks on/1 week off starting 09/29/19. Cisplatin held from cycle 9 (03/23/20) due to fluid status/Afib. He is now on maintenance Gemcitabine. Held after 07/19/20 to proceed with liver target therapy.  10/09/2019 Imaging   CT AP IMPRESSION: 1. The dominant right hepatic lobe mass is minimally reduced in size compared to prior exams, currently measuring 6.2 by 5.3 cm, previously 6.2 by 5.6 cm. However, there is a new small hypodense lesion centrally in the right hepatic lobe which is suspicious for a new small focus of tumor. Accordingly this is an overall mixed appearance. 2. Continued hypoenhancement in the liver downstream of the tumor likely attributable to narrowing or occlusion of the right hepatic vein by the tumor. By virtue of its location the tumor wraps around the intrahepatic portion of the IVC. 3. 4 mm right middle lobe pulmonary nodule, stable compared to earliest available comparison of 07/18/2019. Surveillance of the patient's pulmonary nodules is recommended. 4. Other imaging findings of potential clinical significance: Coronary atherosclerosis. Cholelithiasis. Prominent stool throughout the colon favors constipation. Moderate prostatomegaly with heterogeneous enhancement of the prostate gland. Lumbar spondylosis and degenerative disc disease causing mild bilateral foraminal impingement at L3-4 and L4-5.   Aortic Atherosclerosis (ICD10-I70.0).   12/24/2019 Imaging   CT CAP W Contrast  IMPRESSION: 1. Right hepatic lobe mass and adjacent right hepatic lobe nodules appear grossly stable. No evidence of distant metastatic disease. 2. Continued stability of small pulmonary nodules. Recommend attention on follow-up. 3. Cholelithiasis. 4. Enlarged prostate. 5. Aortic atherosclerosis (ICD10-I70.0). Coronary artery calcification.   02/23/2020 Procedure   He had PAC placed on 02/23/20.    04/08/2020 Imaging   CT CAP  IMPRESSION: 1. Stable or minimally decreased size of a very ill-defined, hypodense and somewhat retractile appearing mass of the central right lobe of the liver abutting the inferior vena cava and right portal vein, measuring approximately 5.7 x 5.0 cm, previously  6.2 x 5.2 cm when measured similarly. Findings are consistent with stable or minimally improved cholangiocarcinoma. 2. Small hypodense nodules of the right lobe identified on prior examination are poorly appreciated on this single phase contrast examination although not grossly changed. Attention on follow-up. 3. There are new, moderate bilateral pleural effusions and associated atelectasis or consolidation as well as a new small pericardial effusion, nonspecific although generally concerning and suspicious for malignant effusions. There is no directly visualized pleural nodularity. 4. Multiple small pulmonary nodules are stable.  No new nodules. 5. Coronary artery disease. Aortic Atherosclerosis (ICD10-I70.0).   07/06/2020 Imaging   MRI ABD IMPRESSION: 1. Interval decrease in size of the right hepatic lobe lesion in the medial aspect of the segment 7. Findings would suggest a good response to treatment with some contraction of the tumor. 2. No new hepatic lesions. No abdominal adenopathy or metastatic disease. 3. Stable mild intrahepatic biliary dilatation in the right hepatic lobe distal to the lesion. 4. Somewhat tortuous and almost beaded appearance of the hepatic and portal vein radicles. Findings could be radiation related. 5. Cholelithiasis.  CT chest wo contrast IMPRESSION: 1. Persistent/stable moderate-sized bilateral pleural effusions with overlying atelectasis. 2. Stable small right pulmonary nodules. No new or progressive findings. Recommend continued surveillance. 3. No mediastinal or hilar mass or adenopathy. 4. Stable advanced three-vessel coronary artery calcifications. 5. Cholelithiasis. Aortic Atherosclerosis (ICD10-I70.0)   08/11/2020 Procedure   Y90 on 08/11/20 and 08/26/20 with Dr Laurence Ferrari    11/30/2020 Imaging   MRI Abdomen  IMPRESSION: 1. Today's study demonstrates progression of disease with enlarging central lesion in the right lobe of the liver  involving portions of segments 7 and 8, now with evidence of some tumor thrombus extension into the intrahepatic portion of the inferior vena cava. This  is associated with increasing intrahepatic biliary ductal dilatation in segment 7 of the liver. 2. In addition, there is some subtle hyperenhancement of the distal common bile duct at and immediately proximally to the level of the ampulla. There is also some subtle delayed enhancement around this region in the pancreatic head. This is of uncertain etiology and significance, but may be inflammatory and currently is not associated with proximal common bile duct dilatation. However, close attention on follow-up imaging is recommended. 3. Cholelithiasis without evidence of acute cholecystitis at this time. 4. Aortic atherosclerosis.     12/24/2020 - 03/21/2021 Chemotherapy   Restart Gemcitabine 2 weeks on/1 week off given disease progression beginning 12/24/20. Discontinued after 03/21/21 due to disease progression in liver.     03/16/2021 Imaging   MRI abdomen  IMPRESSION: No significant change in size of irregular hypovascular mass in the medial right hepatic lobe.   New 1.1 cm hypovascular lesion with peripheral rim enhancement in liver dome, suspicious for metastatic disease.   New small right pleural effusion and mild ascites.   Cholelithiasis, without evidence of cholecystitis or biliary dilatation.   04/05/2021 -  Chemotherapy   Second-line FOLFOX q2weeks starting 04/05/21    04/05/2021 -  Chemotherapy   Immunotherapy with Durvalumab (Imfinzi) q4weeks starting 04/05/21. (in addition to second line FOLFOX)   06/23/2021 Imaging   MRI Abdomen IMPRESSION: 1. No significant change in subcapsular mass of the posterior right lobe of the liver. 2. No significant change in an additional small hyperenhancing lesion of the liver dome, which remains suspicious for a satellite lesion. 3. No new liver lesions. 4. Redemonstrated nonocclusive tumor  thrombus within the IVC. 5. Cholelithiasis without evidence of acute cholecystitis. 6. Trace ascites.  CT Chest w/o contrast IMPRESSION: 1. Occasional small pulmonary nodules in the right lung are stable, for example a 6 mm nodule right lower lobe and a 3 mm nodule of the lateral segment right middle lobe. These are nonspecific and likely benign, incidental sequelae of infection or inflammation, metastatic disease not favored. Attention on follow-up. 2. Previously seen bilateral pleural effusions are resolved. 3. Hypodense lesion of the posterior right lobe of the liver, keeping with known cholangiocarcinoma and better characterized by same day MR. 4. Coronary artery disease.   10/21/2021 Imaging   EXAM: MRI ABDOMEN WITHOUT AND WITH CONTRAST  IMPRESSION: 1. Mild progression of dominant right hepatic lobe mass with involvement of the right portal, hepatic veins and minimal extension into the IVC. 2. Hepatic dome satellite lesion, new or more conspicuous today. 3. Cholelithiasis 4. Decrease in trace perihepatic and perisplenic ascites. 5.  Aortic Atherosclerosis (ICD10-I70.0).   10/21/2021 Imaging   EXAM: CT CHEST WITHOUT CONTRAST  IMPRESSION: 1. Since 06/23/2021, enlargement of a right lower lobe and possible enlargement of a right middle lobe pulmonary nodule. Findings are suspicious for metastatic disease. 2. No thoracic adenopathy. 3. Coronary artery atherosclerosis. Aortic Atherosclerosis (ICD10-I70.0).   11/01/2021 - 01/11/2022 Chemotherapy   Patient is on Treatment Plan : PANCREAS FOLFIRI q14d     01/19/2022 Imaging   EXAM: CT CHEST WITHOUT CONTRAST  IMPRESSION: 1. Continued increase in size of lung nodule within the posterior right lung base which now measures 1 cm and is suspicious for metastatic disease. The remaining lung nodules are stable from 10/21/2021. 2. No thoracic adenopathy. 3. Aortic Atherosclerosis (ICD10-I70.0). Coronary artery calcifications.    01/19/2022 Imaging   EXAM: MRI ABDOMEN WITHOUT AND WITH CONTRAST  IMPRESSION: 1. No significant change in size or appearance of the  large heterogeneous enhancing mass area in the right hepatic lobe as described. There is mild interval enlargement of the adjacent satellite lesion near the dome of the right lobe now measuring 1.9 cm. 2. Redemonstration of a pulmonary nodule in the right lower lobe measuring 9 mm. 3. Small volume ascites. 4. Splenomegaly. 5. Cholelithiasis.   12/08/2022 -  Chemotherapy   Patient is on Treatment Plan : UTERINE Lenvatinib (20) D1-21 + Pembrolizumab (200) D1 q21d        INTERVAL HISTORY: *** Brandon Sherman is here for a follow up of cholangiocarcinoma  He was last seen by me on 11/23/2022 He presents to the clinic     All other systems were reviewed with the patient and are negative.  MEDICAL HISTORY:  Past Medical History:  Diagnosis Date   Arthritis    Diabetes ([place])    GERD (gastroesophageal reflux disease)    Hyperlipidemia    Hypertension    Intrahepatic cholangiocarcinoma ([place])     SURGICAL HISTORY: Past Surgical History:  Procedure Laterality Date   APPENDECTOMY  1980   CARDIOVERSION N/A 04/27/2020   Procedure: CARDIOVERSION;  Surgeon: Dorothy Spark, MD;  Location: [facility];  Service: Cardiovascular;  Laterality: N/A;   CARDIOVERSION N/A 05/19/2020   Procedure: CARDIOVERSION;  Surgeon: Elouise Munroe, MD;  Location: [facility];  Service: Cardiovascular;  Laterality: N/A;   COLONOSCOPY     IR 3D INDEPENDENT WKST  08/11/2020   IR 3D INDEPENDENT WKST  08/11/2020   IR ANGIOGRAM SELECTIVE EACH ADDITIONAL VESSEL  08/11/2020   IR ANGIOGRAM SELECTIVE EACH ADDITIONAL VESSEL  08/11/2020   IR ANGIOGRAM SELECTIVE EACH ADDITIONAL VESSEL  08/11/2020   IR ANGIOGRAM SELECTIVE EACH ADDITIONAL VESSEL  08/11/2020   IR ANGIOGRAM SELECTIVE EACH ADDITIONAL VESSEL  08/11/2020   IR ANGIOGRAM SELECTIVE EACH ADDITIONAL VESSEL  08/11/2020   IR  ANGIOGRAM SELECTIVE EACH ADDITIONAL VESSEL  08/26/2020   IR ANGIOGRAM SELECTIVE EACH ADDITIONAL VESSEL  08/26/2020   IR ANGIOGRAM VISCERAL SELECTIVE  08/11/2020   IR ANGIOGRAM VISCERAL SELECTIVE  08/26/2020   IR EMBO ARTERIAL NOT HEMORR HEMANG INC GUIDE ROADMAPPING  08/11/2020   IR EMBO TUMOR ORGAN ISCHEMIA INFARCT INC GUIDE ROADMAPPING  08/26/2020   IR IMAGING GUIDED PORT INSERTION  02/23/2020   IR RADIOLOGIST EVAL & MGMT  07/14/2020   IR RADIOLOGIST EVAL & MGMT  09/30/2020   IR RADIOLOGIST EVAL & MGMT  12/01/2020   IR US GUIDE VASC ACCESS LEFT  08/26/2020   IR US GUIDE VASC ACCESS RIGHT  08/11/2020    I have reviewed the social history and family history with the patient and they are unchanged from previous note.  ALLERGIES:  has No Known Allergies.  MEDICATIONS:  Current Outpatient Medications  Medication Sig Dispense Refill   allopurinol (ZYLOPRIM) 100 MG tablet Take 100 mg by mouth daily.     apixaban (ELIQUIS) 5 MG TABS tablet TAKE 1 TABLET TWICE DAILY 180 tablet 1   atorvastatin (LIPITOR) 40 MG tablet Take 1 tablet (40 mg total) by mouth daily. 90 tablet 3   colchicine 0.6 MG tablet Take 1 tablet (0.6 mg total) by mouth daily as needed (gout flare). Take daily for 3 days, then as needed for gout flare 30 tablet 0   fenofibrate (TRICOR) 145 MG tablet Take 145 mg by mouth daily.     finasteride (PROSCAR) 5 MG tablet Take 5 mg by mouth daily.     furosemide (LASIX) 40 MG tablet TAKE 2 TABLETS BY MOUTH  2 TIMES DAILY. (Patient taking differently: Take 40 mg by mouth 3 (three) times a week. TAKE ONE TABLET (40 mg) MONDAY, WEDNESDAY, AND FRIDAY.) 360 tablet 3   gabapentin (NEURONTIN) 100 MG capsule Take 100 mg by mouth at bedtime.     hydrALAZINE (APRESOLINE) 50 MG tablet Take 1.5 tablets (75 mg total) by mouth 3 (three) times daily. 135 tablet 3   lenvatinib 20 mg daily dose (LENVIMA) 2 x 10 MG capsule Take 2 capsules (20 mg total) by mouth daily. 60 capsule 0   lidocaine-prilocaine (EMLA) cream  Apply 1 application topically as needed. 30 g 3   metFORMIN (GLUCOPHAGE-XR) 500 MG 24 hr tablet      metoprolol succinate (TOPROL-XL) 50 MG 24 hr tablet Take 1 tablet (50 mg total) by mouth daily. Take with or immediately following a meal. 90 tablet 3   olmesartan (BENICAR) 40 MG tablet Take 1 tablet (40 mg total) by mouth daily. 90 tablet 3   ondansetron (ZOFRAN) 8 MG tablet Take 1 tablet (8 mg total) by mouth every 8 (eight) hours as needed for nausea or vomiting. 30 tablet 1   pantoprazole (PROTONIX) 40 MG tablet TAKE 1 TABLET BY MOUTH TWICE A DAY 180 tablet 2   Pembrolizumab (KEYTRUDA IV) Inject into the vein.     potassium chloride SA (KLOR-CON M20) 20 MEQ tablet Take 1 tablet (20 mEq total) by mouth daily. 7 tablet 0   sucralfate (CARAFATE) 1 g tablet Take 1 tablet (1 g total) by mouth every 6 (six) hours as needed. Please schedule a yearly follow up for further refills: [phone] 60 tablet 0   tamsulosin (FLOMAX) 0.4 MG CAPS capsule Take 0.4 mg by mouth daily.     No current facility-administered medications for this visit.    PHYSICAL EXAMINATION: ECOG PERFORMANCE STATUS: {CHL ONC ECOG PS:[phone]}  There were no vitals filed for this visit. Wt Readings from Last 3 Encounters:  11/29/22 221 lb (100.2 kg)  11/23/22 215 lb 14.4 oz (97.9 kg)  10/09/22 210 lb (95.3 kg)    {Only keep what was examined. If exam not performed, can use .CEXAM } GENERAL:alert, no distress and comfortable SKIN: skin color, texture, turgor are normal, no rashes or significant lesions EYES: normal, Conjunctiva are pink and non-injected, sclera clear {OROPHARYNX:no exudate, no erythema and lips, buccal mucosa, and tongue normal}  NECK: supple, thyroid normal size, non-tender, without nodularity LYMPH:  no palpable lymphadenopathy in the cervical, axillary {or inguinal} LUNGS: clear to auscultation and percussion with normal breathing effort HEART: regular rate & rhythm and no murmurs and no lower  extremity edema ABDOMEN:abdomen soft, non-tender and normal bowel sounds Musculoskeletal:no cyanosis of digits and no clubbing  NEURO: alert & oriented x 3 with fluent speech, no focal motor/sensory deficits  LABORATORY DATA:  I have reviewed the data as listed    Latest Ref Rng & Units 11/23/2022   11:17 AM 10/09/2022    8:43 AM 09/11/2022    1:04 PM  CBC  WBC 4.0 - 10.5 K/uL 5.5  4.2  4.9   Hemoglobin 13.0 - 17.0 g/dL 12.3  11.6  12.1   Hematocrit 39.0 - 52.0 % 36.2  34.8  37.0   Platelets 150 - 400 K/uL 111  106  114         Latest Ref Rng & Units 11/23/2022   11:17 AM 10/09/2022    8:43 AM 09/11/2022    1:04 PM  CMP  Glucose 70 - 99 mg/dL 84  123  127   BUN 8 - 23 mg/dL '15  17  16   '$ Creatinine 0.61 - 1.24 mg/dL 1.25  1.17  1.05   Sodium 135 - 145 mmol/L 140  139  137   Potassium 3.5 - 5.1 mmol/L 3.1  3.7  3.7   Chloride 98 - 111 mmol/L 109  107  109   CO2 22 - 32 mmol/L '24  25  23   '$ Calcium 8.9 - 10.3 mg/dL 8.6  9.1  8.9   Total Protein 6.5 - 8.1 g/dL 6.4  7.1  7.0   Total Bilirubin 0.3 - 1.2 mg/dL 1.0  0.9  0.7   Alkaline Phos 38 - 126 U/L 81  65  64   AST 15 - 41 U/L 60  39  41   ALT 0 - 44 U/L 38  29  33       RADIOGRAPHIC STUDIES: I have personally reviewed the radiological images as listed and agreed with the findings in the report. No results found.    No orders of the defined types were placed in this encounter.  All questions were answered. The patient knows to call the clinic with any problems, questions or concerns. No barriers to learning was detected. The total time spent in the appointment was {CHL ONC TIME VISIT - XBWIO:[id number]}.     Baldemar Friday, CMA 12/07/2022   I, Audry Riles, CMA, am acting as scribe for [name], MD.   {Add scribe attestation statement}

## 2022-12-08 ENCOUNTER — Inpatient Hospital Stay: Payer: Medicare HMO

## 2022-12-08 ENCOUNTER — Other Ambulatory Visit: Payer: Self-pay

## 2022-12-08 ENCOUNTER — Encounter: Payer: Self-pay | Admitting: Hematology

## 2022-12-08 ENCOUNTER — Inpatient Hospital Stay (HOSPITAL_BASED_OUTPATIENT_CLINIC_OR_DEPARTMENT_OTHER): Payer: Medicare HMO | Admitting: Hematology

## 2022-12-08 VITALS — BP 176/88 | HR 55 | Temp 97.7°F | Resp 18 | Ht 69.0 in | Wt 213.1 lb

## 2022-12-08 DIAGNOSIS — R5383 Other fatigue: Secondary | ICD-10-CM | POA: Diagnosis not present

## 2022-12-08 DIAGNOSIS — C221 Intrahepatic bile duct carcinoma: Secondary | ICD-10-CM

## 2022-12-08 DIAGNOSIS — Z79899 Other long term (current) drug therapy: Secondary | ICD-10-CM | POA: Diagnosis not present

## 2022-12-08 DIAGNOSIS — Z66 Do not resuscitate: Secondary | ICD-10-CM | POA: Diagnosis not present

## 2022-12-08 DIAGNOSIS — C7800 Secondary malignant neoplasm of unspecified lung: Secondary | ICD-10-CM | POA: Diagnosis not present

## 2022-12-08 DIAGNOSIS — Z7189 Other specified counseling: Secondary | ICD-10-CM | POA: Diagnosis not present

## 2022-12-08 DIAGNOSIS — E860 Dehydration: Secondary | ICD-10-CM | POA: Diagnosis not present

## 2022-12-08 DIAGNOSIS — Z5112 Encounter for antineoplastic immunotherapy: Secondary | ICD-10-CM | POA: Diagnosis not present

## 2022-12-08 DIAGNOSIS — Z95828 Presence of other vascular implants and grafts: Secondary | ICD-10-CM

## 2022-12-08 DIAGNOSIS — R140 Abdominal distension (gaseous): Secondary | ICD-10-CM | POA: Diagnosis not present

## 2022-12-08 DIAGNOSIS — R112 Nausea with vomiting, unspecified: Secondary | ICD-10-CM | POA: Diagnosis not present

## 2022-12-08 LAB — CMP (CANCER CENTER ONLY)
ALT: 25 U/L (ref 0–44)
AST: 47 U/L — ABNORMAL HIGH (ref 15–41)
Albumin: 3.2 g/dL — ABNORMAL LOW (ref 3.5–5.0)
Alkaline Phosphatase: 70 U/L (ref 38–126)
Anion gap: 8 (ref 5–15)
BUN: 17 mg/dL (ref 8–23)
CO2: 23 mmol/L (ref 22–32)
Calcium: 8.8 mg/dL — ABNORMAL LOW (ref 8.9–10.3)
Chloride: 106 mmol/L (ref 98–111)
Creatinine: 1.44 mg/dL — ABNORMAL HIGH (ref 0.61–1.24)
GFR, Estimated: 51 mL/min — ABNORMAL LOW (ref >60)
Glucose, Bld: 86 mg/dL (ref 70–99)
Potassium: 3.7 mmol/L (ref 3.5–5.1)
Sodium: 137 mmol/L (ref 135–145)
Total Bilirubin: 1.1 mg/dL (ref 0.3–1.2)
Total Protein: 6.3 g/dL — ABNORMAL LOW (ref 6.5–8.1)

## 2022-12-08 LAB — CBC WITH DIFFERENTIAL (CANCER CENTER ONLY)
Abs Immature Granulocytes: 0.01 10*3/uL (ref 0.00–0.07)
Basophils Absolute: 0.1 10*3/uL (ref 0.0–0.1)
Basophils Relative: 2 %
Eosinophils Absolute: 0 10*3/uL (ref 0.0–0.5)
Eosinophils Relative: 1 %
HCT: 37.5 % — ABNORMAL LOW (ref 39.0–52.0)
Hemoglobin: 12.5 g/dL — ABNORMAL LOW (ref 13.0–17.0)
Immature Granulocytes: 0 %
Lymphocytes Relative: 19 %
Lymphs Abs: 0.8 10*3/uL (ref 0.7–4.0)
MCH: 29.6 pg (ref 26.0–34.0)
MCHC: 33.3 g/dL (ref 30.0–36.0)
MCV: 88.7 fL (ref 80.0–100.0)
Monocytes Absolute: 0.5 10*3/uL (ref 0.1–1.0)
Monocytes Relative: 12 %
Neutro Abs: 2.7 10*3/uL (ref 1.7–7.7)
Neutrophils Relative %: 66 %
Platelet Count: 101 10*3/uL — ABNORMAL LOW (ref 150–400)
RBC: 4.23 MIL/uL (ref 4.22–5.81)
RDW: 17.5 % — ABNORMAL HIGH (ref 11.5–15.5)
WBC Count: 4.1 10*3/uL (ref 4.0–10.5)
nRBC: 0 % (ref 0.0–0.2)

## 2022-12-08 LAB — TSH: TSH: 8.653 u[IU]/mL — ABNORMAL HIGH (ref 0.350–4.500)

## 2022-12-08 MED ORDER — SODIUM CHLORIDE 0.9% FLUSH
10.0000 mL | INTRAVENOUS | Status: DC | PRN
  Administered 2022-12-08: 10 mL

## 2022-12-08 MED ORDER — HEPARIN SOD (PORK) LOCK FLUSH 100 UNIT/ML IV SOLN
500.0000 [IU] | Freq: Once | INTRAVENOUS | Status: AC | PRN
  Administered 2022-12-08: 500 [IU]

## 2022-12-08 MED ORDER — SODIUM CHLORIDE 0.9 % IV SOLN
200.0000 mg | Freq: Once | INTRAVENOUS | Status: AC
  Administered 2022-12-08: 200 mg via INTRAVENOUS
  Filled 2022-12-08: qty 200

## 2022-12-08 MED ORDER — SODIUM CHLORIDE 0.9% FLUSH
10.0000 mL | Freq: Once | INTRAVENOUS | Status: AC
  Administered 2022-12-08: 10 mL

## 2022-12-08 MED ORDER — SODIUM CHLORIDE 0.9 % IV SOLN: Freq: Once | INTRAVENOUS | Status: AC

## 2022-12-08 NOTE — Patient Instructions (Signed)
[facility]  Discharge Instructions: Thank you for choosing [facility] to provide your oncology and hematology care.   If you have a lab appointment with the Cancer Center, please go directly to the Cancer Center and check in at the registration area.   Wear comfortable clothing and clothing appropriate for easy access to any Portacath or PICC line.   We strive to give you quality time with your provider. You may need to reschedule your appointment if you arrive late (15 or more minutes).  Arriving late affects you and other patients whose appointments are after yours.  Also, if you miss three or more appointments without notifying the office, you may be dismissed from the clinic at the provider's discretion.      For prescription refill requests, have your pharmacy contact our office and allow 72 hours for refills to be completed.    Today you received the following chemotherapy and/or immunotherapy agents: Keytruda      To help prevent nausea and vomiting after your treatment, we encourage you to take your nausea medication as directed.  BELOW ARE SYMPTOMS THAT SHOULD BE REPORTED IMMEDIATELY: *FEVER GREATER THAN 100.4 F (38 C) OR HIGHER *CHILLS OR SWEATING *NAUSEA AND VOMITING THAT IS NOT CONTROLLED WITH YOUR NAUSEA MEDICATION *UNUSUAL SHORTNESS OF BREATH *UNUSUAL BRUISING OR BLEEDING *URINARY PROBLEMS (pain or burning when urinating, or frequent urination) *BOWEL PROBLEMS (unusual diarrhea, constipation, pain near the anus) TENDERNESS IN MOUTH AND THROAT WITH OR WITHOUT PRESENCE OF ULCERS (sore throat, sores in mouth, or a toothache) UNUSUAL RASH, SWELLING OR PAIN  UNUSUAL VAGINAL DISCHARGE OR ITCHING   Items with * indicate a potential emergency and should be followed up as soon as possible or go to the Emergency Department if any problems should occur.  Please show the CHEMOTHERAPY ALERT CARD or IMMUNOTHERAPY ALERT CARD at check-in to  the Emergency Department and triage nurse.  Should you have questions after your visit or need to cancel or reschedule your appointment, please contact [facility]  Dept: [phone]  and follow the prompts.  Office hours are 8:00 a.m. to 4:30 p.m. Monday - Friday. Please note that voicemails left after 4:00 p.m. may not be returned until the following business day.  We are closed weekends and major holidays. You have access to a nurse at all times for urgent questions. Please call the main number to the clinic Dept: [phone] and follow the prompts.   For any non-urgent questions, you may also contact your provider using MyChart. We now offer e-Visits for anyone 18 and older to request care online for non-urgent symptoms. For details visit [email or web address].   Also download the MyChart app! Go to the app store, search "MyChart", open the app, select [facility], and log in with your MyChart username and password.   

## 2022-12-08 NOTE — Assessment & Plan Note (Signed)
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 -s/p first line Cisplatin and Gemcitabine, 09/29/19 - 07/19/20. Cisplatin discontinued 03/23/20 due to Afib. -he progressed through single agent gemcitabine (12/24/20 - 03/16/21), FOLFOX plus durvalumab (04/05/21 - 10/21/21), FOLFIRI (11/01/21 - 01/19/22), and then tried ivosidenib (02/02/22 - 04/2022) -he was switched to [place] on 05/15/22. He tolerated low-dose 80 mg daily well, but developed multiple side effects on 120 mg daily. He is tolerating '80mg'$  for 3 weeks on, 1 week off with no noticeable side effects. -due to disease progression, treatment changed to Lenvatinib and Keytruda, plan to start today

## 2022-12-08 NOTE — Assessment & Plan Note (Signed)
-  We previously discussed the incurable nature of his cancer, and the overall poor prognosis, especially if he does not have good response to chemotherapy or progress on chemo -The patient understands the goal of care is palliative. -I recommended DNR/DNI and he agreed in 01/2022.DNR order entered today

## 2022-12-08 NOTE — Progress Notes (Addendum)
[place]   [phone]) [phone] [phone]   Clinic Follow up Note    Patient Care Team: Marga Hoots, NP as PCP - General (Adult Health Nurse Practitioner) Lorretta Harp, MD as PCP - Cardiology (Cardiology) [name], MD as Consulting Physician (General Surgery) [name], [name], MD as Consulting Physician (Gastroenterology) [name], MD as Consulting Physician (Hematology) Lorretta Harp, MD as Consulting Physician (Cardiology)   Date of Service:  12/08/2022   CHIEF COMPLAINT: f/u of cholangiocarcinoma    CURRENT THERAPY:  Lenvatinib started in late Dec 2023 Keytruda every 3 weeks starting today      ASSESSMENT:  Brandon Sherman is a 77 y.o. 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 -s/p first line Cisplatin and Gemcitabine, 09/29/19 - 07/19/20. Cisplatin discontinued 03/23/20 due to Afib. -he progressed through single agent gemcitabine (12/24/20 - 03/16/21), FOLFOX plus durvalumab (04/05/21 - 10/21/21), FOLFIRI (11/01/21 - 01/19/22), and then tried ivosidenib (02/02/22 - 04/2022) -he was switched to [place] on 05/15/22. He tolerated low-dose 80 mg daily well, but developed multiple side effects on 120 mg daily. He is tolerating '80mg'$  for 3 weeks on, 1 week off with no noticeable side effects. -due to disease progression, treatment changed to Lenvatinib and Keytruda, plan to start today    Goals of care, counseling/discussion -We previously discussed the incurable nature of his cancer, and the overall poor prognosis, especially if he does not have good response to chemotherapy or progress on chemo -The patient understands the goal of care is palliative. -I recommended DNR/DNI and he agreed in 01/2022.DNR order entered today      Leg edema -He is on Lasix, dose was reduced  by [place] previously due to slightly worsening renal function -He was seen by cardiologist Dr. Gloriann Loan recently, who recommended increasing Lasix -He will increase Lasix to 20 mg twice daily (currently on 3 days a week)   PLAN: - start Keytruda today - next treatment in 3wks with labs and f/u -increase lasix -Continue Lenvima at current dose, he knows to call me if fatigue is getting worse -Follow-up in 3 weeks     SUMMARY OF ONCOLOGIC HISTORY:     Oncology History Overview Note   Cancer Staging Intrahepatic cholangiocarcinoma ([place]) Staging form: Intrahepatic Bile Duct, AJCC 8th Edition - Clinical stage from 08/19/2019: Stage II (cT2, cN0, cM0) - Signed by [name], MD on 08/19/2019      Intrahepatic cholangiocarcinoma ([place])   06/28/2019 Imaging     CT AP W Contrast 06/28/19  IMPRESSION: 1. Heterogeneous hypodensity posteriorly in the right hepatic lobe and potentially extending into the caudate lobe suspicious for a mass. There is felt to be truncation of branches of the portal vein in this vicinity and some narrowing of the hepatic vein, as well as triangular-shaped regions of abnormal hypoenhancement posteriorly in the right hepatic lobe likely representing downstream vascular effects. Cannot exclude malignancy such as cholangiocarcinoma or hepatocellular carcinoma, and follow up hepatic protocol MRI with and without contrast is recommended to further characterize. 2. 4 mm right middle lobe pulmonary nodule is likely benign but may merit surveillance. 3. Cholelithiasis. 4.  Aortic Atherosclerosis (ICD10-I70.0). 5. Prostatomegaly. 6. Mild impingement at L3-4 and L4-5.     07/31/2019 Imaging     MRI Liver 07/31/19 IMPRESSION: 1. 7.3 cm in long axis mass in the  right hepatic lobe spanning into the caudate lobe, high suspicion for malignancy such as hepatocellular carcinoma or cholangiocarcinoma. Suspected effacement or occlusion of the right hepatic vein and posterior branches of  the right portal vein. Two smaller tumor nodules along the posterior periphery of the dominant mass. Tissue diagnosis is recommended. 2. No findings of pathologic adenopathy or distant metastatic spread. 3. 9 mm gallstone in the gallbladder. There is mild gallbladder wall thickening which may be from nondistention, correlate clinically in assessing for cholecystitis. 4.  Aortic Atherosclerosis (ICD10-I70.0). 5. Mild diffuse hepatic steatosis.     08/11/2019 Initial Biopsy     DIAGNOSIS: 08/11/19  A. LIVER, RIGHT, BIOPSY:  - Adenocarcinoma.     08/18/2019 Imaging     CT Chest 08/18/19  IMPRESSION: 1. Multiple pulmonary nodules largest at approximately 7 mm in the right lower lobe, nonspecific but concerning given findings in the liver. 2. No signs of definitive metastatic disease, also with mildly enlarged upper abdominal lymph nodes as discussed.   Aortic Atherosclerosis (ICD10-I70.0).     08/19/2019 Initial Diagnosis     Intrahepatic cholangiocarcinoma ([place])     08/19/2019 Cancer Staging     Staging form: Intrahepatic Bile Duct, AJCC 8th Edition - Clinical stage from 08/19/2019: Stage II (cT2, cN0, cM0) - Signed by [name], MD on 08/19/2019     09/29/2019 - 07/19/2020 Chemotherapy     Cisplatin and Gemcitabine 2 weeks on/1 week off starting 09/29/19. Cisplatin held from cycle 9 (03/23/20) due to fluid status/Afib. He is now on maintenance Gemcitabine. Held after 07/19/20 to proceed with liver target therapy.        10/09/2019 Imaging     CT AP IMPRESSION: 1. The dominant right hepatic lobe mass is minimally reduced in size compared to prior exams, currently measuring 6.2 by 5.3 cm, previously 6.2 by 5.6 cm. However, there is a new small hypodense lesion centrally in the right hepatic lobe which is suspicious for a new small focus of tumor. Accordingly this is an overall mixed appearance. 2. Continued hypoenhancement in the liver downstream of the tumor likely attributable to narrowing  or occlusion of the right hepatic vein by the tumor. By virtue of its location the tumor wraps around the intrahepatic portion of the IVC. 3. 4 mm right middle lobe pulmonary nodule, stable compared to earliest available comparison of 07/18/2019. Surveillance of the patient's pulmonary nodules is recommended. 4. Other imaging findings of potential clinical significance: Coronary atherosclerosis. Cholelithiasis. Prominent stool throughout the colon favors constipation. Moderate prostatomegaly with heterogeneous enhancement of the prostate gland. Lumbar spondylosis and degenerative disc disease causing mild bilateral foraminal impingement at L3-4 and L4-5.   Aortic Atherosclerosis (ICD10-I70.0).     12/24/2019 Imaging     CT CAP W Contrast  IMPRESSION: 1. Right hepatic lobe mass and adjacent right hepatic lobe nodules appear grossly stable. No evidence of distant metastatic disease. 2. Continued stability of small pulmonary nodules. Recommend attention on follow-up. 3. Cholelithiasis. 4. Enlarged prostate. 5. Aortic atherosclerosis (ICD10-I70.0). Coronary artery calcification.     02/23/2020 Procedure     He had PAC placed on 02/23/20.      04/08/2020 Imaging     CT CAP  IMPRESSION: 1. Stable or minimally decreased size of a very ill-defined, hypodense and somewhat retractile appearing mass of the central right lobe of the liver abutting the inferior vena cava and right portal vein, measuring approximately 5.7 x 5.0 cm, previously 6.2 x 5.2 cm when measured similarly. Findings are consistent with  stable or minimally improved cholangiocarcinoma. 2. Small hypodense nodules of the right lobe identified on prior examination are poorly appreciated on this single phase contrast examination although not grossly changed. Attention on follow-up. 3. There are new, moderate bilateral pleural effusions and associated atelectasis or consolidation as well as a new small pericardial effusion,  nonspecific although generally concerning and suspicious for malignant effusions. There is no directly visualized pleural nodularity. 4. Multiple small pulmonary nodules are stable.  No new nodules. 5. Coronary artery disease. Aortic Atherosclerosis (ICD10-I70.0).     07/06/2020 Imaging     MRI ABD IMPRESSION: 1. Interval decrease in size of the right hepatic lobe lesion in the medial aspect of the segment 7. Findings would suggest a good response to treatment with some contraction of the tumor. 2. No new hepatic lesions. No abdominal adenopathy or metastatic disease. 3. Stable mild intrahepatic biliary dilatation in the right hepatic lobe distal to the lesion. 4. Somewhat tortuous and almost beaded appearance of the hepatic and portal vein radicles. Findings could be radiation related. 5. Cholelithiasis.   CT chest wo contrast IMPRESSION: 1. Persistent/stable moderate-sized bilateral pleural effusions with overlying atelectasis. 2. Stable small right pulmonary nodules. No new or progressive findings. Recommend continued surveillance. 3. No mediastinal or hilar mass or adenopathy. 4. Stable advanced three-vessel coronary artery calcifications. 5. Cholelithiasis. Aortic Atherosclerosis (ICD10-I70.0)     08/11/2020 Procedure     Y90 on 08/11/20 and 08/26/20 with Dr Laurence Ferrari      11/30/2020 Imaging     MRI Abdomen  IMPRESSION: 1. Today's study demonstrates progression of disease with enlarging central lesion in the right lobe of the liver involving portions of segments 7 and 8, now with evidence of some tumor thrombus extension into the intrahepatic portion of the inferior vena cava. This is associated with increasing intrahepatic biliary ductal dilatation in segment 7 of the liver. 2. In addition, there is some subtle hyperenhancement of the distal common bile duct at and immediately proximally to the level of the ampulla. There is also some subtle delayed enhancement around this  region in the pancreatic head. This is of uncertain etiology and significance, but may be inflammatory and currently is not associated with proximal common bile duct dilatation. However, close attention on follow-up imaging is recommended. 3. Cholelithiasis without evidence of acute cholecystitis at this time. 4. Aortic atherosclerosis.       12/24/2020 - 03/21/2021 Chemotherapy     Restart Gemcitabine 2 weeks on/1 week off given disease progression beginning 12/24/20. Discontinued after 03/21/21 due to disease progression in liver.        03/16/2021 Imaging     MRI abdomen  IMPRESSION: No significant change in size of irregular hypovascular mass in the medial right hepatic lobe.   New 1.1 cm hypovascular lesion with peripheral rim enhancement in liver dome, suspicious for metastatic disease.   New small right pleural effusion and mild ascites.   Cholelithiasis, without evidence of cholecystitis or biliary dilatation.     04/05/2021 -  Chemotherapy     Second-line FOLFOX q2weeks starting 04/05/21       04/05/2021 -  Chemotherapy     Immunotherapy with Durvalumab (Imfinzi) q4weeks starting 04/05/21. (in addition to second line FOLFOX)     06/23/2021 Imaging     MRI Abdomen IMPRESSION: 1. No significant change in subcapsular mass of the posterior right lobe of the liver. 2. No significant change in an additional small hyperenhancing lesion of the liver dome, which remains suspicious for a  satellite lesion. 3. No new liver lesions. 4. 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 5. Cholelithiasis without evidence of acute cholecystitis. 6. Trace ascites.   CT Chest w/o contrast IMPRESSION: 1. Occasional small pulmonary nodules in the right lung are stable, for example a 6 mm nodule right lower lobe and a 3 mm nodule of the lateral segment right middle lobe. These are nonspecific and likely benign, incidental sequelae of infection or inflammation, metastatic disease not  favored. Attention on follow-up. 2. Previously seen bilateral pleural effusions are resolved. 3. Hypodense lesion of the posterior right lobe of the liver, keeping with known cholangiocarcinoma and better characterized by same day MR. 4. Coronary artery disease.     10/21/2021 Imaging     EXAM: MRI ABDOMEN WITHOUT AND WITH CONTRAST   IMPRESSION: 1. Mild progression of dominant right hepatic lobe mass with involvement of the right portal, hepatic veins and minimal extension into the IVC. 2. Hepatic dome satellite lesion, new or more conspicuous today. 3. Cholelithiasis 4. Decrease in trace perihepatic and perisplenic ascites. 5.  Aortic Atherosclerosis (ICD10-I70.0).     10/21/2021 Imaging     EXAM: CT CHEST WITHOUT CONTRAST   IMPRESSION: 1. Since 06/23/2021, enlargement of a right lower lobe and possible enlargement of a right middle lobe pulmonary nodule. Findings are suspicious for metastatic disease. 2. No thoracic adenopathy. 3. Coronary artery atherosclerosis. Aortic Atherosclerosis (ICD10-I70.0).     11/01/2021 - 01/11/2022 Chemotherapy     Patient is on Treatment Plan : PANCREAS FOLFIRI q14d      01/19/2022 Imaging     EXAM: CT CHEST WITHOUT CONTRAST   IMPRESSION: 1. Continued increase in size of lung nodule within the posterior right lung base which now measures 1 cm and is suspicious for metastatic disease. The remaining lung nodules are stable from 10/21/2021. 2. No thoracic adenopathy. 3. Aortic Atherosclerosis (ICD10-I70.0). Coronary artery calcifications.     01/19/2022 Imaging     EXAM: MRI ABDOMEN WITHOUT AND WITH CONTRAST   IMPRESSION: 1. No significant change in size or appearance of the large heterogeneous enhancing mass area in the right hepatic lobe as described. There is mild interval enlargement of the adjacent satellite lesion near the dome of the right lobe now measuring 1.9 cm. 2. Redemonstration of a pulmonary nodule in the right lower  lobe measuring 9 mm. 3. Small volume ascites. 4. Splenomegaly. 5. Cholelithiasis.     12/08/2022 -  Chemotherapy     Patient is on Treatment Plan : UTERINE Lenvatinib (20) D1-21 + Pembrolizumab (200) D1 q21d            INTERVAL HISTORY:  Brandon Sherman is here for a follow up of cholangiocarcinoma  He was last seen by me on 11/23/2022 He presents to the clinic accompanied by wife. Pt states he has swelling in his legs, he was seen by cardiologist Dr. Jamison Oka recently, and he suggested going up with the dose of Lasix.  Wife also noticed worsening fatigue, he is taking naps 2 to 3 hours a day.  He is still able to function well overall, no complaints of pain, nausea, or other new symptoms.      All other systems were reviewed with the patient and are negative.   MEDICAL HISTORY:      Past Medical History:  Diagnosis Date   Arthritis     Diabetes ([place])     GERD (gastroesophageal reflux disease)     Hyperlipidemia     Hypertension  Intrahepatic cholangiocarcinoma ([place])        SURGICAL HISTORY:      Past Surgical History:  Procedure Laterality Date   APPENDECTOMY   1980   CARDIOVERSION N/A 04/27/2020    Procedure: CARDIOVERSION;  Surgeon: Dorothy Spark, MD;  Location: [facility];  Service: Cardiovascular;  Laterality: N/A;   CARDIOVERSION N/A 05/19/2020    Procedure: CARDIOVERSION;  Surgeon: Elouise Munroe, MD;  Location: [facility];  Service: Cardiovascular;  Laterality: N/A;   COLONOSCOPY       IR 3D INDEPENDENT WKST   08/11/2020   IR 3D INDEPENDENT WKST   08/11/2020   IR ANGIOGRAM SELECTIVE EACH ADDITIONAL VESSEL   08/11/2020   IR ANGIOGRAM SELECTIVE EACH ADDITIONAL VESSEL   08/11/2020   IR ANGIOGRAM SELECTIVE EACH ADDITIONAL VESSEL   08/11/2020   IR ANGIOGRAM SELECTIVE EACH ADDITIONAL VESSEL   08/11/2020   IR ANGIOGRAM SELECTIVE EACH ADDITIONAL VESSEL   08/11/2020   IR ANGIOGRAM SELECTIVE EACH ADDITIONAL VESSEL   08/11/2020   IR ANGIOGRAM SELECTIVE EACH ADDITIONAL  VESSEL   08/26/2020   IR ANGIOGRAM SELECTIVE EACH ADDITIONAL VESSEL   08/26/2020   IR ANGIOGRAM VISCERAL SELECTIVE   08/11/2020   IR ANGIOGRAM VISCERAL SELECTIVE   08/26/2020   IR EMBO ARTERIAL NOT HEMORR HEMANG INC GUIDE ROADMAPPING   08/11/2020   IR EMBO TUMOR ORGAN ISCHEMIA INFARCT INC GUIDE ROADMAPPING   08/26/2020   IR IMAGING GUIDED PORT INSERTION   02/23/2020   IR RADIOLOGIST EVAL & MGMT   07/14/2020   IR RADIOLOGIST EVAL & MGMT   09/30/2020   IR RADIOLOGIST EVAL & MGMT   12/01/2020   IR US GUIDE VASC ACCESS LEFT   08/26/2020   IR US GUIDE VASC ACCESS RIGHT   08/11/2020      I have reviewed the social history and family history with the patient and they are unchanged from previous note.   ALLERGIES:  has No Known Allergies.   MEDICATIONS:        Current Outpatient Medications  Medication Sig Dispense Refill   allopurinol (ZYLOPRIM) 100 MG tablet Take 100 mg by mouth daily.       apixaban (ELIQUIS) 5 MG TABS tablet TAKE 1 TABLET TWICE DAILY 180 tablet 1   atorvastatin (LIPITOR) 40 MG tablet Take 1 tablet (40 mg total) by mouth daily. 90 tablet 3   colchicine 0.6 MG tablet Take 1 tablet (0.6 mg total) by mouth daily as needed (gout flare). Take daily for 3 days, then as needed for gout flare 30 tablet 0   fenofibrate (TRICOR) 145 MG tablet Take 145 mg by mouth daily.       finasteride (PROSCAR) 5 MG tablet Take 5 mg by mouth daily.       furosemide (LASIX) 40 MG tablet TAKE 2 TABLETS BY MOUTH 2 TIMES DAILY. (Patient taking differently: Take 40 mg by mouth 3 (three) times a week. TAKE ONE TABLET (40 mg) MONDAY, WEDNESDAY, AND FRIDAY.) 360 tablet 3   gabapentin (NEURONTIN) 100 MG capsule Take 100 mg by mouth at bedtime.       hydrALAZINE (APRESOLINE) 50 MG tablet Take 1.5 tablets (75 mg total) by mouth 3 (three) times daily. 135 tablet 3   lenvatinib 20 mg daily dose (LENVIMA) 2 x 10 MG capsule Take 2 capsules (20 mg total) by mouth daily. 60 capsule 0   lidocaine-prilocaine (EMLA) cream Apply  1 application topically as needed. 30 g 3   metFORMIN (GLUCOPHAGE-XR) 500 MG 24  hr tablet         metoprolol succinate (TOPROL-XL) 50 MG 24 hr tablet Take 1 tablet (50 mg total) by mouth daily. Take with or immediately following a meal. 90 tablet 3   olmesartan (BENICAR) 40 MG tablet Take 1 tablet (40 mg total) by mouth daily. 90 tablet 3   ondansetron (ZOFRAN) 8 MG tablet Take 1 tablet (8 mg total) by mouth every 8 (eight) hours as needed for nausea or vomiting. 30 tablet 1   pantoprazole (PROTONIX) 40 MG tablet TAKE 1 TABLET BY MOUTH TWICE A DAY 180 tablet 2   Pembrolizumab (KEYTRUDA IV) Inject into the vein.       potassium chloride SA (KLOR-CON M20) 20 MEQ tablet Take 1 tablet (20 mEq total) by mouth daily. 7 tablet 0   sucralfate (CARAFATE) 1 g tablet Take 1 tablet (1 g total) by mouth every 6 (six) hours as needed. Please schedule a yearly follow up for further refills: [phone] 60 tablet 0   tamsulosin (FLOMAX) 0.4 MG CAPS capsule Take 0.4 mg by mouth daily.        No current facility-administered medications for this visit.      PHYSICAL EXAMINATION: ECOG PERFORMANCE STATUS: 2 - Symptomatic, <50% confined to bed   There were no vitals filed for this visit.    Wt Readings from Last 3 Encounters:  11/29/22 221 lb (100.2 kg)  11/23/22 215 lb 14.4 oz (97.9 kg)  10/09/22 210 lb (95.3 kg)     GENERAL:alert, no distress and comfortable SKIN: skin color, texture, turgor are normal, no rashes or significant lesions EYES: normal, Conjunctiva are pink and non-injected, sclera clear NECK: supple, thyroid normal size, non-tender, without nodularity LYMPH:  no palpable lymphadenopathy in the cervical, axillary  LUNGS: clear to auscultation and percussion with normal breathing effort HEART: regular rate & rhythm and no murmurs and trace bilateral lower extremity edema ABDOMEN:abdomen soft, distended, non-tender and normal bowel sounds Musculoskeletal:no cyanosis of digits and no  clubbing  NEURO: alert & oriented x 3 with fluent speech, no focal motor/sensory deficits   LABORATORY DATA:  I have reviewed the data as listed     Latest Ref Rng & Units 11/23/2022   11:17 AM 10/09/2022    8:43 AM 09/11/2022    1:04 PM  CBC  WBC 4.0 - 10.5 K/uL 5.5  4.2  4.9   Hemoglobin 13.0 - 17.0 g/dL 12.3  11.6  12.1   Hematocrit 39.0 - 52.0 % 36.2  34.8  37.0   Platelets 150 - 400 K/uL 111  106  114             Latest Ref Rng & Units 11/23/2022   11:17 AM 10/09/2022    8:43 AM 09/11/2022    1:04 PM  CMP  Glucose 70 - 99 mg/dL 84  123  127   BUN 8 - 23 mg/dL '15  17  16   '$ Creatinine 0.61 - 1.24 mg/dL 1.25  1.17  1.05   Sodium 135 - 145 mmol/L 140  139  137   Potassium 3.5 - 5.1 mmol/L 3.1  3.7  3.7   Chloride 98 - 111 mmol/L 109  107  109   CO2 22 - 32 mmol/L '24  25  23   '$ Calcium 8.9 - 10.3 mg/dL 8.6  9.1  8.9   Total Protein 6.5 - 8.1 g/dL 6.4  7.1  7.0   Total Bilirubin 0.3 - 1.2 mg/dL 1.0  0.9  0.7   Alkaline Phos 38 - 126 U/L 81  65  64   AST 15 - 41 U/L 60  39  41   ALT 0 - 44 U/L 38  29  33           RADIOGRAPHIC STUDIES: I have personally reviewed the radiological images as listed and agreed with the findings in the report. Imaging Results (Last 48 hours)  No results found.        No orders of the defined types were placed in this encounter.   All questions were answered. The patient knows to call the clinic with any problems, questions or concerns. No barriers to learning was detected. The total time spent in the appointment was 30 minutes.   [name]  12/08/2022    IMaurine Simmering, CMA, am acting as scribe for [name], MD.    I have reviewed the above documentation for accuracy and completeness, and I agree with the above.

## 2022-12-09 LAB — T4: T4, Total: 11.2 ug/dL (ref 4.5–12.0)

## 2022-12-10 ENCOUNTER — Other Ambulatory Visit: Payer: Self-pay

## 2022-12-12 ENCOUNTER — Other Ambulatory Visit (HOSPITAL_COMMUNITY): Payer: Self-pay

## 2022-12-14 ENCOUNTER — Other Ambulatory Visit: Payer: Self-pay | Admitting: Hematology

## 2022-12-14 ENCOUNTER — Other Ambulatory Visit (HOSPITAL_COMMUNITY): Payer: Self-pay

## 2022-12-14 MED ORDER — LENVATINIB (20 MG DAILY DOSE) 2 X 10 MG PO CPPK
20.0000 mg | ORAL_CAPSULE | Freq: Every day | ORAL | 0 refills | Status: DC
  Filled 2022-12-14: qty 60, 30d supply, fill #0

## 2022-12-15 ENCOUNTER — Telehealth: Payer: Self-pay | Admitting: Hematology

## 2022-12-15 ENCOUNTER — Other Ambulatory Visit: Payer: Self-pay

## 2022-12-15 NOTE — Telephone Encounter (Signed)
Spoke with patient spouse confirming all upcoming appointments

## 2022-12-18 ENCOUNTER — Encounter: Payer: Self-pay | Admitting: Hematology

## 2022-12-18 ENCOUNTER — Other Ambulatory Visit: Payer: Self-pay

## 2022-12-18 ENCOUNTER — Other Ambulatory Visit: Payer: Medicare HMO

## 2022-12-18 ENCOUNTER — Telehealth: Payer: Self-pay

## 2022-12-18 DIAGNOSIS — E860 Dehydration: Secondary | ICD-10-CM | POA: Insufficient documentation

## 2022-12-18 DIAGNOSIS — C221 Intrahepatic bile duct carcinoma: Secondary | ICD-10-CM

## 2022-12-18 NOTE — Telephone Encounter (Signed)
Pt's spouse called stating that the pt is sleepy and very fatigue.  Spouse stated the pt is not eating nor drinking.  Pt is taking Lenvima orally and getting Keytruda (Pembrolizumab).  Spouse stated pt got Keytruda and has been feeling like this every since.  Spouse stated the pt c/o being cold pratically freezing but every time pt's temperature is checked the pt does not have a fever.  Pt is negative for SOB, cough, and congestion. Spouse stated pt had 5 days of diarrhea but reported the maximum episodes pt had in 1 day was 3.  Pt is drinking Gatorade but not enough to rehydrate per spouse.  Spouse reported pt is eating hardly anything but constantly c/o freezing.  Asked pt and spouse if they could come in on Tuesday 12/19/2022 for further evaluation.  Pt and spouse are in agreement with coming in for further evaluation.  Spoke with [facility] to see if pt could be seen for labs, IVF, and further evaluation.  [facility] will see pt on 12/19/2022.  Dr. Burr Medico also notified of pt's spouse's call.

## 2022-12-19 ENCOUNTER — Inpatient Hospital Stay: Payer: Medicare HMO

## 2022-12-19 ENCOUNTER — Inpatient Hospital Stay (HOSPITAL_BASED_OUTPATIENT_CLINIC_OR_DEPARTMENT_OTHER): Payer: Medicare HMO | Admitting: Physician Assistant

## 2022-12-19 VITALS — BP 155/85 | HR 56 | Resp 16

## 2022-12-19 VITALS — BP 134/86 | HR 88 | Temp 97.5°F | Resp 19 | Ht 69.0 in | Wt 192.9 lb

## 2022-12-19 DIAGNOSIS — E860 Dehydration: Secondary | ICD-10-CM

## 2022-12-19 DIAGNOSIS — R140 Abdominal distension (gaseous): Secondary | ICD-10-CM | POA: Diagnosis not present

## 2022-12-19 DIAGNOSIS — R112 Nausea with vomiting, unspecified: Secondary | ICD-10-CM | POA: Diagnosis not present

## 2022-12-19 DIAGNOSIS — Z95828 Presence of other vascular implants and grafts: Secondary | ICD-10-CM

## 2022-12-19 DIAGNOSIS — Z79899 Other long term (current) drug therapy: Secondary | ICD-10-CM | POA: Diagnosis not present

## 2022-12-19 DIAGNOSIS — C7800 Secondary malignant neoplasm of unspecified lung: Secondary | ICD-10-CM | POA: Diagnosis not present

## 2022-12-19 DIAGNOSIS — Z66 Do not resuscitate: Secondary | ICD-10-CM | POA: Diagnosis not present

## 2022-12-19 DIAGNOSIS — C221 Intrahepatic bile duct carcinoma: Secondary | ICD-10-CM

## 2022-12-19 DIAGNOSIS — R5383 Other fatigue: Secondary | ICD-10-CM | POA: Diagnosis not present

## 2022-12-19 DIAGNOSIS — Z5112 Encounter for antineoplastic immunotherapy: Secondary | ICD-10-CM | POA: Diagnosis not present

## 2022-12-19 LAB — CMP (CANCER CENTER ONLY)
ALT: 41 U/L (ref 0–44)
AST: 88 U/L — ABNORMAL HIGH (ref 15–41)
Albumin: 3.3 g/dL — ABNORMAL LOW (ref 3.5–5.0)
Alkaline Phosphatase: 80 U/L (ref 38–126)
Anion gap: 10 (ref 5–15)
BUN: 35 mg/dL — ABNORMAL HIGH (ref 8–23)
CO2: 24 mmol/L (ref 22–32)
Calcium: 9 mg/dL (ref 8.9–10.3)
Chloride: 103 mmol/L (ref 98–111)
Creatinine: 1.86 mg/dL — ABNORMAL HIGH (ref 0.61–1.24)
GFR, Estimated: 38 mL/min — ABNORMAL LOW (ref >60)
Glucose, Bld: 82 mg/dL (ref 70–99)
Potassium: 3.7 mmol/L (ref 3.5–5.1)
Sodium: 137 mmol/L (ref 135–145)
Total Bilirubin: 2 mg/dL — ABNORMAL HIGH (ref 0.3–1.2)
Total Protein: 7 g/dL (ref 6.5–8.1)

## 2022-12-19 LAB — CBC WITH DIFFERENTIAL (CANCER CENTER ONLY)
Abs Immature Granulocytes: 0.02 10*3/uL (ref 0.00–0.07)
Basophils Absolute: 0.1 10*3/uL (ref 0.0–0.1)
Basophils Relative: 1 %
Eosinophils Absolute: 0.1 10*3/uL (ref 0.0–0.5)
Eosinophils Relative: 2 %
HCT: 47.1 % (ref 39.0–52.0)
Hemoglobin: 15.9 g/dL (ref 13.0–17.0)
Immature Granulocytes: 0 %
Lymphocytes Relative: 17 %
Lymphs Abs: 1.2 10*3/uL (ref 0.7–4.0)
MCH: 29.6 pg (ref 26.0–34.0)
MCHC: 33.8 g/dL (ref 30.0–36.0)
MCV: 87.7 fL (ref 80.0–100.0)
Monocytes Absolute: 0.6 10*3/uL (ref 0.1–1.0)
Monocytes Relative: 9 %
Neutro Abs: 4.8 10*3/uL (ref 1.7–7.7)
Neutrophils Relative %: 71 %
Platelet Count: 116 10*3/uL — ABNORMAL LOW (ref 150–400)
RBC: 5.37 MIL/uL (ref 4.22–5.81)
RDW: 18.7 % — ABNORMAL HIGH (ref 11.5–15.5)
WBC Count: 6.8 10*3/uL (ref 4.0–10.5)
nRBC: 0 % (ref 0.0–0.2)

## 2022-12-19 LAB — MAGNESIUM: Magnesium: 2 mg/dL (ref 1.7–2.4)

## 2022-12-19 MED ORDER — HEPARIN SOD (PORK) LOCK FLUSH 100 UNIT/ML IV SOLN
500.0000 [IU] | Freq: Once | INTRAVENOUS | Status: AC
  Administered 2022-12-19: 500 [IU]

## 2022-12-19 MED ORDER — ONDANSETRON HCL 4 MG/2ML IJ SOLN
4.0000 mg | Freq: Once | INTRAMUSCULAR | Status: AC
  Administered 2022-12-19: 4 mg via INTRAVENOUS
  Filled 2022-12-19: qty 2

## 2022-12-19 MED ORDER — SODIUM CHLORIDE 0.9% FLUSH
10.0000 mL | Freq: Once | INTRAVENOUS | Status: AC
  Administered 2022-12-19: 10 mL

## 2022-12-19 MED ORDER — SODIUM CHLORIDE 0.9 % IV SOLN: Freq: Once | INTRAVENOUS | Status: AC

## 2022-12-19 NOTE — Progress Notes (Signed)
Symptom Management Consult note [place]    Patient Care Team: Marga Hoots, NP as PCP - General (Adult Health Nurse Practitioner) Lorretta Harp, MD as PCP - Cardiology (Cardiology) [name], MD as Consulting Physician (General Surgery) [name], [name], MD as Consulting Physician (Gastroenterology) [name], MD as Consulting Physician (Hematology) Lorretta Harp, MD as Consulting Physician (Cardiology)    Name of the patient: Brandon Sherman  [id number]  [birth year]   Date of visit: 12/19/2022   Chief Complaint/Reason for visit: fatigue   Current Therapy: PO Lenvantinib and Keytruda  Last treatment:  Day 1   Cycle 1 on 12/08/22   ASSESSMENT & PLAN: Patient is a 77 y.o. 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  I have viewed most recent oncology note and lab work.    #Intrahepatic cholangiocarcinoma  - Next appointment with oncologist is 12/29/22   #Fatigue -Patient unable to perform ADLs secondary to extreme fatigue. Mild fatigue was present while take Lenvima pills only, significantly worsened after first Keytruda. Both have adverse effect of fatigue. -CBC without anemia. Platelets 116, consistent with baseline.  #Abdominal distention -Patient nontoxic appearing, HDS.  -Patient minimal to no PO intake over last week. Chart review shows weight loss of 21 pounds in 11 days -Abdomen is distended without tenderness. Has fluid wave. Patient and spouse unsure if this is more distended than usual. Patient has no lower extremity edema. -CMP shows bump in AST at 88, previously 47 x 11 days ago. T bili is elevated 2.0 without RUQ tenderness. Worsening kidney function BUN/Cr 35/1.86 compared to 17/1.44 x 11 days ago.  -Patient given 1L NS and IV zofran in clinic for hydration and symptom management. -Chart review shows patient had MR abdomen 11/21/2022 that showed contracted gallbladder with  stones, splenomegaly with slightly increased ascites compared to MRI from 08/09/2022. -STAT abdominal US ordered to evaluate ascites and gall bladder. The first available appointment is tomorrow morning at [facility]. I discussed this with patient and spouse who prefer for outpatient imaging and avoiding ED at this time as symptoms have been ongoing. I discussed strict ED precautions. Patient will RTC after [place] tomorrow morning to discuss results.    Heme/Onc History: Oncology History Overview Note  Cancer Staging Intrahepatic cholangiocarcinoma ([place]) Staging form: Intrahepatic Bile Duct, AJCC 8th Edition - Clinical stage from 08/19/2019: Stage II (cT2, cN0, cM0) - Signed by [name], MD on 08/19/2019    Intrahepatic cholangiocarcinoma ([place])  06/28/2019 Imaging   CT AP W Contrast 06/28/19  IMPRESSION: 1. Heterogeneous hypodensity posteriorly in the right hepatic lobe and potentially extending into the caudate lobe suspicious for a mass. There is felt to be truncation of branches of the portal vein in this vicinity and some narrowing of the hepatic vein, as well as triangular-shaped regions of abnormal hypoenhancement posteriorly in the right hepatic lobe likely representing downstream vascular effects. Cannot exclude malignancy such as cholangiocarcinoma or hepatocellular carcinoma, and follow up hepatic protocol MRI with and without contrast is recommended to further characterize. 2. 4 mm right middle lobe pulmonary nodule is likely benign but may merit surveillance. 3. Cholelithiasis. 4.  Aortic Atherosclerosis (ICD10-I70.0). 5. Prostatomegaly. 6. Mild impingement at L3-4 and L4-5.   07/31/2019 Imaging   MRI Liver 07/31/19 IMPRESSION: 1. 7.3 cm in long axis mass in the right hepatic lobe spanning into the caudate lobe, high suspicion for malignancy such as hepatocellular carcinoma or cholangiocarcinoma. Suspected effacement or  occlusion of the right hepatic vein and posterior branches of  the right portal vein. Two smaller tumor nodules along the posterior periphery of the dominant mass. Tissue diagnosis is recommended. 2. No findings of pathologic adenopathy or distant metastatic spread. 3. 9 mm gallstone in the gallbladder. There is mild gallbladder wall thickening which may be from nondistention, correlate clinically in assessing for cholecystitis. 4.  Aortic Atherosclerosis (ICD10-I70.0). 5. Mild diffuse hepatic steatosis.   08/11/2019 Initial Biopsy   DIAGNOSIS: 08/11/19  A. LIVER, RIGHT, BIOPSY:  - Adenocarcinoma.   08/18/2019 Imaging   CT Chest 08/18/19  IMPRESSION: 1. Multiple pulmonary nodules largest at approximately 7 mm in the right lower lobe, nonspecific but concerning given findings in the liver. 2. No signs of definitive metastatic disease, also with mildly enlarged upper abdominal lymph nodes as discussed.   Aortic Atherosclerosis (ICD10-I70.0).   08/19/2019 Initial Diagnosis   Intrahepatic cholangiocarcinoma ([place])   08/19/2019 Cancer Staging   Staging form: Intrahepatic Bile Duct, AJCC 8th Edition - Clinical stage from 08/19/2019: Stage II (cT2, cN0, cM0) - Signed by [name], MD on 08/19/2019   09/29/2019 - 07/19/2020 Chemotherapy   Cisplatin and Gemcitabine 2 weeks on/1 week off starting 09/29/19. Cisplatin held from cycle 9 (03/23/20) due to fluid status/Afib. He is now on maintenance Gemcitabine. Held after 07/19/20 to proceed with liver target therapy.     10/09/2019 Imaging   CT AP IMPRESSION: 1. The dominant right hepatic lobe mass is minimally reduced in size compared to prior exams, currently measuring 6.2 by 5.3 cm, previously 6.2 by 5.6 cm. However, there is a new small hypodense lesion centrally in the right hepatic lobe which is suspicious for a new small focus of tumor. Accordingly this is an overall mixed appearance. 2. Continued hypoenhancement in the liver downstream of the tumor likely attributable to narrowing or occlusion of the right  hepatic vein by the tumor. By virtue of its location the tumor wraps around the intrahepatic portion of the IVC. 3. 4 mm right middle lobe pulmonary nodule, stable compared to earliest available comparison of 07/18/2019. Surveillance of the patient's pulmonary nodules is recommended. 4. Other imaging findings of potential clinical significance: Coronary atherosclerosis. Cholelithiasis. Prominent stool throughout the colon favors constipation. Moderate prostatomegaly with heterogeneous enhancement of the prostate gland. Lumbar spondylosis and degenerative disc disease causing mild bilateral foraminal impingement at L3-4 and L4-5.   Aortic Atherosclerosis (ICD10-I70.0).   12/24/2019 Imaging   CT CAP W Contrast  IMPRESSION: 1. Right hepatic lobe mass and adjacent right hepatic lobe nodules appear grossly stable. No evidence of distant metastatic disease. 2. Continued stability of small pulmonary nodules. Recommend attention on follow-up. 3. Cholelithiasis. 4. Enlarged prostate. 5. Aortic atherosclerosis (ICD10-I70.0). Coronary artery calcification.   02/23/2020 Procedure   He had PAC placed on 02/23/20.    04/08/2020 Imaging   CT CAP  IMPRESSION: 1. Stable or minimally decreased size of a very ill-defined, hypodense and somewhat retractile appearing mass of the central right lobe of the liver abutting the inferior vena cava and right portal vein, measuring approximately 5.7 x 5.0 cm, previously 6.2 x 5.2 cm when measured similarly. Findings are consistent with stable or minimally improved cholangiocarcinoma. 2. Small hypodense nodules of the right lobe identified on prior examination are poorly appreciated on this single phase contrast examination although not grossly changed. Attention on follow-up. 3. There are new, moderate bilateral pleural effusions and associated atelectasis or consolidation as well as a new small pericardial effusion, nonspecific although generally concerning  and suspicious for malignant effusions. There is no directly visualized pleural nodularity. 4. Multiple small pulmonary nodules are stable.  No new nodules. 5. Coronary artery disease. Aortic Atherosclerosis (ICD10-I70.0).   07/06/2020 Imaging   MRI ABD IMPRESSION: 1. Interval decrease in size of the right hepatic lobe lesion in the medial aspect of the segment 7. Findings would suggest a good response to treatment with some contraction of the tumor. 2. No new hepatic lesions. No abdominal adenopathy or metastatic disease. 3. Stable mild intrahepatic biliary dilatation in the right hepatic lobe distal to the lesion. 4. Somewhat tortuous and almost beaded appearance of the hepatic and portal vein radicles. Findings could be radiation related. 5. Cholelithiasis.  CT chest wo contrast IMPRESSION: 1. Persistent/stable moderate-sized bilateral pleural effusions with overlying atelectasis. 2. Stable small right pulmonary nodules. No new or progressive findings. Recommend continued surveillance. 3. No mediastinal or hilar mass or adenopathy. 4. Stable advanced three-vessel coronary artery calcifications. 5. Cholelithiasis. Aortic Atherosclerosis (ICD10-I70.0)   08/11/2020 Procedure   Y90 on 08/11/20 and 08/26/20 with Dr Laurence Ferrari    11/30/2020 Imaging   MRI Abdomen  IMPRESSION: 1. Today's study demonstrates progression of disease with enlarging central lesion in the right lobe of the liver involving portions of segments 7 and 8, now with evidence of some tumor thrombus extension into the intrahepatic portion of the inferior vena cava. This is associated with increasing intrahepatic biliary ductal dilatation in segment 7 of the liver. 2. In addition, there is some subtle hyperenhancement of the distal common bile duct at and immediately proximally to the level of the ampulla. There is also some subtle delayed enhancement around this region in the pancreatic head. This is of uncertain  etiology and significance, but may be inflammatory and currently is not associated with proximal common bile duct dilatation. However, close attention on follow-up imaging is recommended. 3. Cholelithiasis without evidence of acute cholecystitis at this time. 4. Aortic atherosclerosis.     12/24/2020 - 03/21/2021 Chemotherapy   Restart Gemcitabine 2 weeks on/1 week off given disease progression beginning 12/24/20. Discontinued after 03/21/21 due to disease progression in liver.     03/16/2021 Imaging   MRI abdomen  IMPRESSION: No significant change in size of irregular hypovascular mass in the medial right hepatic lobe.   New 1.1 cm hypovascular lesion with peripheral rim enhancement in liver dome, suspicious for metastatic disease.   New small right pleural effusion and mild ascites.   Cholelithiasis, without evidence of cholecystitis or biliary dilatation.   04/05/2021 -  Chemotherapy   Second-line FOLFOX q2weeks starting 04/05/21    04/05/2021 -  Chemotherapy   Immunotherapy with Durvalumab (Imfinzi) q4weeks starting 04/05/21. (in addition to second line FOLFOX)   06/23/2021 Imaging   MRI Abdomen IMPRESSION: 1. No significant change in subcapsular mass of the posterior right lobe of the liver. 2. No significant change in an additional small hyperenhancing lesion of the liver dome, which remains suspicious for a satellite lesion. 3. No new liver lesions. 4. 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 5. Cholelithiasis without evidence of acute cholecystitis. 6. Trace ascites.  CT Chest w/o contrast IMPRESSION: 1. Occasional small pulmonary nodules in the right lung are stable, for example a 6 mm nodule right lower lobe and a 3 mm nodule of the lateral segment right middle lobe. These are nonspecific and likely benign, incidental sequelae of infection or inflammation, metastatic disease not favored. Attention on follow-up. 2. Previously seen bilateral pleural  effusions are resolved. 3. Hypodense lesion  of the posterior right lobe of the liver, keeping with known cholangiocarcinoma and better characterized by same day MR. 4. Coronary artery disease.   10/21/2021 Imaging   EXAM: MRI ABDOMEN WITHOUT AND WITH CONTRAST  IMPRESSION: 1. Mild progression of dominant right hepatic lobe mass with involvement of the right portal, hepatic veins and minimal extension into the IVC. 2. Hepatic dome satellite lesion, new or more conspicuous today. 3. Cholelithiasis 4. Decrease in trace perihepatic and perisplenic ascites. 5.  Aortic Atherosclerosis (ICD10-I70.0).   10/21/2021 Imaging   EXAM: CT CHEST WITHOUT CONTRAST  IMPRESSION: 1. Since 06/23/2021, enlargement of a right lower lobe and possible enlargement of a right middle lobe pulmonary nodule. Findings are suspicious for metastatic disease. 2. No thoracic adenopathy. 3. Coronary artery atherosclerosis. Aortic Atherosclerosis (ICD10-I70.0).   11/01/2021 - 01/11/2022 Chemotherapy   Patient is on Treatment Plan : PANCREAS FOLFIRI q14d     01/19/2022 Imaging   EXAM: CT CHEST WITHOUT CONTRAST  IMPRESSION: 1. Continued increase in size of lung nodule within the posterior right lung base which now measures 1 cm and is suspicious for metastatic disease. The remaining lung nodules are stable from 10/21/2021. 2. No thoracic adenopathy. 3. Aortic Atherosclerosis (ICD10-I70.0). Coronary artery calcifications.   01/19/2022 Imaging   EXAM: MRI ABDOMEN WITHOUT AND WITH CONTRAST  IMPRESSION: 1. No significant change in size or appearance of the large heterogeneous enhancing mass area in the right hepatic lobe as described. There is mild interval enlargement of the adjacent satellite lesion near the dome of the right lobe now measuring 1.9 cm. 2. Redemonstration of a pulmonary nodule in the right lower lobe measuring 9 mm. 3. Small volume ascites. 4. Splenomegaly. 5. Cholelithiasis.   12/08/2022 -   Chemotherapy   Patient is on Treatment Plan : UTERINE Lenvatinib (20) D1-21 + Pembrolizumab (200) D1 q21d         Interval history-: Brandon Sherman is a 77 y.o. male with oncologic history as above presenting to [facility] today with chief complaint of fatigue x 10 days.  Spouse accompanies patient and provides majority of history.  Spouse reports the day after his first Keytruda infusion patient had profound fatigue.  He spends almost all of his time in bed asleep.  She states he had fatigue when he first started the Lenvima PO chemotherapy however it was tolerable.  Patient is now unable to complete ADLs secondary to this fatigue. Patient reports his stomach feels upset most of the time. He had 3 episodes of NBNB emesis x 3 days ago. He has been taking Zofran at home with minimal symptom improvement.  Last dose was yesterday. He estimates drinking 16oz of fluid daily.  He has had decreased p.o. intake over the last week and reports early satiety.  He denies any abdominal pain.  Patient unsure if his abdomen appears larger than normal.  He denies any urinary symptoms.  He thinks he has lost weight since last treatment. He has not had any fevers although admits to chills and needing to lay under multiple blankets to get warm. Spouse has been monitoring temperature at home.  Denies shortness of breath, urinary symptoms, back pain, rash. Denies needing paracentesis in the past.     ROS  All other systems are reviewed and are negative for acute change except as noted in the HPI.    No Known Allergies   Past Medical History:  Diagnosis Date   Arthritis    Diabetes ([place])    GERD (gastroesophageal reflux disease)  Hyperlipidemia    Hypertension    Intrahepatic cholangiocarcinoma ([place])      Past Surgical History:  Procedure Laterality Date   APPENDECTOMY  1980   CARDIOVERSION N/A 04/27/2020   Procedure: CARDIOVERSION;  Surgeon: Dorothy Spark, MD;  Location: [facility];  Service:  Cardiovascular;  Laterality: N/A;   CARDIOVERSION N/A 05/19/2020   Procedure: CARDIOVERSION;  Surgeon: Elouise Munroe, MD;  Location: [facility] ENDOSCOPY;  Service: Cardiovascular;  Laterality: N/A;   COLONOSCOPY     IR 3D INDEPENDENT WKST  08/11/2020   IR 3D INDEPENDENT WKST  08/11/2020   IR ANGIOGRAM SELECTIVE EACH ADDITIONAL VESSEL  08/11/2020   IR ANGIOGRAM SELECTIVE EACH ADDITIONAL VESSEL  08/11/2020   IR ANGIOGRAM SELECTIVE EACH ADDITIONAL VESSEL  08/11/2020   IR ANGIOGRAM SELECTIVE EACH ADDITIONAL VESSEL  08/11/2020   IR ANGIOGRAM SELECTIVE EACH ADDITIONAL VESSEL  08/11/2020   IR ANGIOGRAM SELECTIVE EACH ADDITIONAL VESSEL  08/11/2020   IR ANGIOGRAM SELECTIVE EACH ADDITIONAL VESSEL  08/26/2020   IR ANGIOGRAM SELECTIVE EACH ADDITIONAL VESSEL  08/26/2020   IR ANGIOGRAM VISCERAL SELECTIVE  08/11/2020   IR ANGIOGRAM VISCERAL SELECTIVE  08/26/2020   IR EMBO ARTERIAL NOT HEMORR HEMANG INC GUIDE ROADMAPPING  08/11/2020   IR EMBO TUMOR ORGAN ISCHEMIA INFARCT INC GUIDE ROADMAPPING  08/26/2020   IR IMAGING GUIDED PORT INSERTION  02/23/2020   IR RADIOLOGIST EVAL & MGMT  07/14/2020   IR RADIOLOGIST EVAL & MGMT  09/30/2020   IR RADIOLOGIST EVAL & MGMT  12/01/2020   IR US GUIDE VASC ACCESS LEFT  08/26/2020   IR US GUIDE VASC ACCESS RIGHT  08/11/2020    Social History   Socioeconomic History   Marital status: Married    Spouse name: Not on file   Number of children: 3   Years of education: Not on file   Highest education level: Not on file  Occupational History   Not on file  Tobacco Use   Smoking status: Never   Smokeless tobacco: Current    Types: Chew   Tobacco comments:    for 60 years, 1-2 daily   Vaping Use   Vaping Use: Never used  Substance and Sexual Activity   Alcohol use: Not Currently    Comment: occasionally   Drug use: Never   Sexual activity: Not on file  Other Topics Concern   Not on file  Social History Narrative   Not on file   Social Determinants of Health   Financial  Resource Strain: Not on file  Food Insecurity: Not on file  Transportation Needs: No Transportation Needs (05/21/2020)   PRAPARE - Transportation    Lack of Transportation (Medical): No    Lack of Transportation (Non-Medical): No  Physical Activity: Insufficiently Active (05/21/2020)   Exercise Vital Sign    Days of Exercise per Week: 1 day    Minutes of Exercise per Session: 50 min  Stress: Not on file  Social Connections: Unknown (05/21/2020)   Social Connection and Isolation Panel [NHANES]    Frequency of Communication with Friends and Family: More than three times a week    Frequency of Social Gatherings with Friends and Family: More than three times a week    Attends Religious Services: Not on Advertising copywriter or Organizations: Not on file    Attends Archivist Meetings: Not on file    Marital Status: Married  Intimate Partner Violence: Not on file    Family History  Problem Relation  Age of Onset   Heart attack Mother    Stroke Father    Colon cancer Neg Hx    Esophageal cancer Neg Hx    Stomach cancer Neg Hx    Rectal cancer Neg Hx    Colon polyps Neg Hx      Current Outpatient Medications:    allopurinol (ZYLOPRIM) 100 MG tablet, Take 100 mg by mouth daily., Disp: , Rfl:    apixaban (ELIQUIS) 5 MG TABS tablet, TAKE 1 TABLET TWICE DAILY, Disp: 180 tablet, Rfl: 1   atorvastatin (LIPITOR) 40 MG tablet, Take 1 tablet (40 mg total) by mouth daily., Disp: 90 tablet, Rfl: 3   colchicine 0.6 MG tablet, Take 1 tablet (0.6 mg total) by mouth daily as needed (gout flare). Take daily for 3 days, then as needed for gout flare, Disp: 30 tablet, Rfl: 0   fenofibrate (TRICOR) 145 MG tablet, Take 145 mg by mouth daily., Disp: , Rfl:    finasteride (PROSCAR) 5 MG tablet, Take 5 mg by mouth daily., Disp: , Rfl:    furosemide (LASIX) 40 MG tablet, TAKE 2 TABLETS BY MOUTH 2 TIMES DAILY. (Patient taking differently: Take 40 mg by mouth 3 (three) times a week. TAKE ONE  TABLET (40 mg) MONDAY, WEDNESDAY, AND FRIDAY.), Disp: 360 tablet, Rfl: 3   gabapentin (NEURONTIN) 100 MG capsule, Take 100 mg by mouth at bedtime., Disp: , Rfl:    hydrALAZINE (APRESOLINE) 50 MG tablet, Take 1.5 tablets (75 mg total) by mouth 3 (three) times daily., Disp: 135 tablet, Rfl: 3   lenvatinib 20 mg daily dose (LENVIMA) 2 x 10 MG capsule, Take 2 capsules (20 mg total) by mouth daily., Disp: 60 capsule, Rfl: 0   lidocaine-prilocaine (EMLA) cream, Apply 1 application topically as needed., Disp: 30 g, Rfl: 3   metFORMIN (GLUCOPHAGE-XR) 500 MG 24 hr tablet, , Disp: , Rfl:    metoprolol succinate (TOPROL-XL) 50 MG 24 hr tablet, Take 1 tablet (50 mg total) by mouth daily. Take with or immediately following a meal., Disp: 90 tablet, Rfl: 3   olmesartan (BENICAR) 40 MG tablet, Take 1 tablet (40 mg total) by mouth daily., Disp: 90 tablet, Rfl: 3   ondansetron (ZOFRAN) 8 MG tablet, Take 1 tablet (8 mg total) by mouth every 8 (eight) hours as needed for nausea or vomiting., Disp: 30 tablet, Rfl: 1   pantoprazole (PROTONIX) 40 MG tablet, TAKE 1 TABLET BY MOUTH TWICE A DAY, Disp: 180 tablet, Rfl: 2   Pembrolizumab (KEYTRUDA IV), Inject into the vein., Disp: , Rfl:    potassium chloride SA (KLOR-CON M20) 20 MEQ tablet, Take 1 tablet (20 mEq total) by mouth daily., Disp: 7 tablet, Rfl: 0   sucralfate (CARAFATE) 1 g tablet, Take 1 tablet (1 g total) by mouth every 6 (six) hours as needed. Please schedule a yearly follow up for further refills: [phone], Disp: 60 tablet, Rfl: 0   tamsulosin (FLOMAX) 0.4 MG CAPS capsule, Take 0.4 mg by mouth daily., Disp: , Rfl:   PHYSICAL EXAM: ECOG FS:3 - Symptomatic, >50% confined to bed    Vitals:   12/19/22 1115  BP: 134/86  Pulse: 88  Resp: 19  Temp: (!) 97.5 F (36.4 C)  TempSrc: Oral  SpO2: 96%  Weight: 192 lb 14.4 oz (87.5 kg)  Height: '5\' 9"'$  (1.753 m)   Physical Exam Vitals and nursing note reviewed.  Constitutional:      Appearance: He is  well-developed. He is not ill-appearing or toxic-appearing.  HENT:     Head: Normocephalic.     Nose: Nose normal.     Mouth/Throat:     Mouth: Mucous membranes are dry.  Eyes:     General: Scleral icterus (mild) present.     Conjunctiva/sclera: Conjunctivae normal.  Neck:     Vascular: No JVD.  Cardiovascular:     Rate and Rhythm: Normal rate. Rhythm irregular.     Pulses: Normal pulses.     Heart sounds: Normal heart sounds.  Pulmonary:     Effort: Pulmonary effort is normal.     Breath sounds: Normal breath sounds.  Abdominal:     General: There is distension.     Tenderness: There is no abdominal tenderness. There is no right CVA tenderness, left CVA tenderness, guarding or rebound.     Hernia: No hernia is present.     Comments: Positive fluid wave without pain. Splenomegaly  Musculoskeletal:     Cervical back: Normal range of motion.     Right lower leg: No edema.     Left lower leg: No edema.  Skin:    General: Skin is warm and dry.     Findings: No rash.  Neurological:     Mental Status: He is oriented to person, place, and time.        LABORATORY DATA: I have reviewed the data as listed    Latest Ref Rng & Units 12/19/2022   10:41 AM 12/08/2022    8:25 AM 11/23/2022   11:17 AM  CBC  WBC 4.0 - 10.5 K/uL 6.8  4.1  5.5   Hemoglobin 13.0 - 17.0 g/dL 15.9  12.5  12.3   Hematocrit 39.0 - 52.0 % 47.1  37.5  36.2   Platelets 150 - 400 K/uL 116  101  111         Latest Ref Rng & Units 12/19/2022   10:41 AM 12/08/2022    8:25 AM 11/23/2022   11:17 AM  CMP  Glucose 70 - 99 mg/dL 82  86  84   BUN 8 - 23 mg/dL 35  17  15   Creatinine 0.61 - 1.24 mg/dL 1.86  1.44  1.25   Sodium 135 - 145 mmol/L 137  137  140   Potassium 3.5 - 5.1 mmol/L 3.7  3.7  3.1   Chloride 98 - 111 mmol/L 103  106  109   CO2 22 - 32 mmol/L '24  23  24   '$ Calcium 8.9 - 10.3 mg/dL 9.0  8.8  8.6   Total Protein 6.5 - 8.1 g/dL 7.0  6.3  6.4   Total Bilirubin 0.3 - 1.2 mg/dL 2.0  1.1  1.0    Alkaline Phos 38 - 126 U/L 80  70  81   AST 15 - 41 U/L 88  47  60   ALT 0 - 44 U/L 41  25  38        RADIOGRAPHIC STUDIES (from last 24 hours if applicable) I have personally reviewed the radiological images as listed and agreed with the findings in the report. No results found.      Visit Diagnosis: 1. Intrahepatic cholangiocarcinoma (HCC)   2. Abdominal distension   3. Dehydration   4. Nausea and vomiting, unspecified vomiting type      Orders Placed This Encounter  Procedures   US Abdomen Complete    Standing Status:   Future    Standing Expiration Date:   12/19/2023    Order  Specific Question:   Reason for Exam (SYMPTOM  OR DIAGNOSIS REQUIRED)    Answer:   abdominal distention, elevated t bili, intrahepatic cholangiocarcinoma    Order Specific Question:   Preferred imaging location?    Answer:   [facility]    All questions were answered. The patient knows to call the clinic with any problems, questions or concerns. No barriers to learning was detected.  I have spent a total of 30 minutes minutes of face-to-face and non-face-to-face time, preparing to see the patient, obtaining and/or reviewing separately obtained history, performing a medically appropriate examination, counseling and educating the patient, ordering tests, documenting clinical information in the electronic health record, and care coordination (communications with other health care professionals or caregivers).    Thank you for allowing me to participate in the care of this patient.    Barrie Folk, PA-C Department of Hematology/Oncology [facility] at [facility] Phone: [phone]  [phone]    12/19/2022 3:37 PM

## 2022-12-19 NOTE — Patient Instructions (Addendum)
Arrive to [name] at 7:30 AM tomorrow. Park at [facility]"  Do not eat or drink after 11pm tonight. Do not take your Eliquis tomorrow morning.  After your ultrasound come to the [place] for your appointment with me to discuss the results.

## 2022-12-19 NOTE — Patient Instructions (Signed)

## 2022-12-20 ENCOUNTER — Ambulatory Visit (HOSPITAL_COMMUNITY)
Admission: RE | Admit: 2022-12-20 | Discharge: 2022-12-20 | Disposition: A | Discharge status: Home / Self Care | Payer: Medicare HMO | Source: Ambulatory Visit | Attending: Physician Assistant | Admitting: Physician Assistant

## 2022-12-20 ENCOUNTER — Other Ambulatory Visit: Payer: Self-pay

## 2022-12-20 ENCOUNTER — Ambulatory Visit (HOSPITAL_COMMUNITY): Payer: Medicare HMO

## 2022-12-20 ENCOUNTER — Inpatient Hospital Stay (HOSPITAL_BASED_OUTPATIENT_CLINIC_OR_DEPARTMENT_OTHER): Payer: Medicare HMO | Admitting: Physician Assistant

## 2022-12-20 VITALS — BP 126/89 | HR 81 | Temp 97.9°F | Resp 19 | Ht 69.0 in | Wt 194.2 lb

## 2022-12-20 DIAGNOSIS — C221 Intrahepatic bile duct carcinoma: Secondary | ICD-10-CM

## 2022-12-20 DIAGNOSIS — E860 Dehydration: Secondary | ICD-10-CM | POA: Diagnosis not present

## 2022-12-20 DIAGNOSIS — K769 Liver disease, unspecified: Secondary | ICD-10-CM | POA: Diagnosis not present

## 2022-12-20 DIAGNOSIS — Z8505 Personal history of malignant neoplasm of liver: Secondary | ICD-10-CM | POA: Diagnosis not present

## 2022-12-20 DIAGNOSIS — Z5112 Encounter for antineoplastic immunotherapy: Secondary | ICD-10-CM | POA: Diagnosis not present

## 2022-12-20 DIAGNOSIS — K828 Other specified diseases of gallbladder: Secondary | ICD-10-CM | POA: Diagnosis not present

## 2022-12-20 DIAGNOSIS — Z79899 Other long term (current) drug therapy: Secondary | ICD-10-CM | POA: Diagnosis not present

## 2022-12-20 DIAGNOSIS — K8020 Calculus of gallbladder without cholecystitis without obstruction: Secondary | ICD-10-CM | POA: Diagnosis not present

## 2022-12-20 DIAGNOSIS — Z66 Do not resuscitate: Secondary | ICD-10-CM | POA: Diagnosis not present

## 2022-12-20 DIAGNOSIS — R140 Abdominal distension (gaseous): Secondary | ICD-10-CM

## 2022-12-20 DIAGNOSIS — R5383 Other fatigue: Secondary | ICD-10-CM | POA: Diagnosis not present

## 2022-12-20 DIAGNOSIS — R112 Nausea with vomiting, unspecified: Secondary | ICD-10-CM | POA: Diagnosis not present

## 2022-12-20 DIAGNOSIS — C7800 Secondary malignant neoplasm of unspecified lung: Secondary | ICD-10-CM | POA: Diagnosis not present

## 2022-12-20 NOTE — Progress Notes (Unsigned)
Symptom Management Consult note [place]    Patient Care Team: Marga Hoots, NP as PCP - General (Adult Health Nurse Practitioner) Lorretta Harp, MD as PCP - Cardiology (Cardiology) [name], MD as Consulting Physician (General Surgery) [name], [name], MD as Consulting Physician (Gastroenterology) [name], MD as Consulting Physician (Hematology) Lorretta Harp, MD as Consulting Physician (Cardiology)    Name of the patient: Brandon Sherman  [id number]  [birth year]   Date of visit: 12/20/2022   Chief Complaint/Reason for visit: discuss [place] results   Current Therapy: Lenvima PO and Keytruda  Last treatment:  Day 1   Cycle 1 on 12/08/22   ASSESSMENT & PLAN: Patient is a 77 y.o. male  with oncologic history of intrahepatic cholangiocarcinoma  followed by Dr. Burr Medico.  I have viewed most recent oncology note and lab work.    # Intrahepatic cholangiocarcinoma  - Next appointment with oncologist is 12/29/22 -Concern for disease progression. Dr. Burr Medico is advising to stop treatment and offered hospice services. Patient and spouse will think about this and let MD know at upcoming appointment. -Infusion next week was cancelled.   #Abdominal distension -Patient had [place] this AM showing large volume ascites. Radiologist also comments on 18 mm gallstone with GB wall thickening up to 8 mm. With the ascites and associated liver diease GB thickening is nonspecific -Abdominal exam similar to yesterday- non tender, fluid wave. -Paracentesis with cytology ordered. My original order says no albumin needed however I attempted to change that to: if more than 5L drawn off albumin is required. I called IR staff and they will correct the order. Patient scheduled for this tomorrow.    Shared visit with oncologist Dr. Burr Medico   Heme/Onc History: Oncology History Overview Note  Cancer Staging Intrahepatic cholangiocarcinoma ([place]) Staging form: Intrahepatic  Bile Duct, AJCC 8th Edition - Clinical stage from 08/19/2019: Stage II (cT2, cN0, cM0) - Signed by [name], MD on 08/19/2019    Intrahepatic cholangiocarcinoma ([place])  06/28/2019 Imaging   CT AP W Contrast 06/28/19  IMPRESSION: 1. Heterogeneous hypodensity posteriorly in the right hepatic lobe and potentially extending into the caudate lobe suspicious for a mass. There is felt to be truncation of branches of the portal vein in this vicinity and some narrowing of the hepatic vein, as well as triangular-shaped regions of abnormal hypoenhancement posteriorly in the right hepatic lobe likely representing downstream vascular effects. Cannot exclude malignancy such as cholangiocarcinoma or hepatocellular carcinoma, and follow up hepatic protocol MRI with and without contrast is recommended to further characterize. 2. 4 mm right middle lobe pulmonary nodule is likely benign but may merit surveillance. 3. Cholelithiasis. 4.  Aortic Atherosclerosis (ICD10-I70.0). 5. Prostatomegaly. 6. Mild impingement at L3-4 and L4-5.   07/31/2019 Imaging   MRI Liver 07/31/19 IMPRESSION: 1. 7.3 cm in long axis mass in the right hepatic lobe spanning into the caudate lobe, high suspicion for malignancy such as hepatocellular carcinoma or cholangiocarcinoma. Suspected effacement or occlusion of the right hepatic vein and posterior branches of the right portal vein. Two smaller tumor nodules along the posterior periphery of the dominant mass. Tissue diagnosis is recommended. 2. No findings of pathologic adenopathy or distant metastatic spread. 3. 9 mm gallstone in the gallbladder. There is mild gallbladder wall thickening which may be from nondistention, correlate clinically in assessing for cholecystitis. 4.  Aortic Atherosclerosis (ICD10-I70.0). 5. Mild diffuse hepatic steatosis.   08/11/2019 Initial Biopsy   DIAGNOSIS: 08/11/19  A. LIVER, RIGHT,  BIOPSY:  - Adenocarcinoma.   08/18/2019 Imaging   CT Chest  08/18/19  IMPRESSION: 1. Multiple pulmonary nodules largest at approximately 7 mm in the right lower lobe, nonspecific but concerning given findings in the liver. 2. No signs of definitive metastatic disease, also with mildly enlarged upper abdominal lymph nodes as discussed.   Aortic Atherosclerosis (ICD10-I70.0).   08/19/2019 Initial Diagnosis   Intrahepatic cholangiocarcinoma ([place])   08/19/2019 Cancer Staging   Staging form: Intrahepatic Bile Duct, AJCC 8th Edition - Clinical stage from 08/19/2019: Stage II (cT2, cN0, cM0) - Signed by [name], MD on 08/19/2019   09/29/2019 - 07/19/2020 Chemotherapy   Cisplatin and Gemcitabine 2 weeks on/1 week off starting 09/29/19. Cisplatin held from cycle 9 (03/23/20) due to fluid status/Afib. He is now on maintenance Gemcitabine. Held after 07/19/20 to proceed with liver target therapy.     10/09/2019 Imaging   CT AP IMPRESSION: 1. The dominant right hepatic lobe mass is minimally reduced in size compared to prior exams, currently measuring 6.2 by 5.3 cm, previously 6.2 by 5.6 cm. However, there is a new small hypodense lesion centrally in the right hepatic lobe which is suspicious for a new small focus of tumor. Accordingly this is an overall mixed appearance. 2. Continued hypoenhancement in the liver downstream of the tumor likely attributable to narrowing or occlusion of the right hepatic vein by the tumor. By virtue of its location the tumor wraps around the intrahepatic portion of the IVC. 3. 4 mm right middle lobe pulmonary nodule, stable compared to earliest available comparison of 07/18/2019. Surveillance of the patient's pulmonary nodules is recommended. 4. Other imaging findings of potential clinical significance: Coronary atherosclerosis. Cholelithiasis. Prominent stool throughout the colon favors constipation. Moderate prostatomegaly with heterogeneous enhancement of the prostate gland. Lumbar spondylosis and degenerative disc disease causing  mild bilateral foraminal impingement at L3-4 and L4-5.   Aortic Atherosclerosis (ICD10-I70.0).   12/24/2019 Imaging   CT CAP W Contrast  IMPRESSION: 1. Right hepatic lobe mass and adjacent right hepatic lobe nodules appear grossly stable. No evidence of distant metastatic disease. 2. Continued stability of small pulmonary nodules. Recommend attention on follow-up. 3. Cholelithiasis. 4. Enlarged prostate. 5. Aortic atherosclerosis (ICD10-I70.0). Coronary artery calcification.   02/23/2020 Procedure   He had PAC placed on 02/23/20.    04/08/2020 Imaging   CT CAP  IMPRESSION: 1. Stable or minimally decreased size of a very ill-defined, hypodense and somewhat retractile appearing mass of the central right lobe of the liver abutting the inferior vena cava and right portal vein, measuring approximately 5.7 x 5.0 cm, previously 6.2 x 5.2 cm when measured similarly. Findings are consistent with stable or minimally improved cholangiocarcinoma. 2. Small hypodense nodules of the right lobe identified on prior examination are poorly appreciated on this single phase contrast examination although not grossly changed. Attention on follow-up. 3. There are new, moderate bilateral pleural effusions and associated atelectasis or consolidation as well as a new small pericardial effusion, nonspecific although generally concerning and suspicious for malignant effusions. There is no directly visualized pleural nodularity. 4. Multiple small pulmonary nodules are stable.  No new nodules. 5. Coronary artery disease. Aortic Atherosclerosis (ICD10-I70.0).   07/06/2020 Imaging   MRI ABD IMPRESSION: 1. Interval decrease in size of the right hepatic lobe lesion in the medial aspect of the segment 7. Findings would suggest a good response to treatment with some contraction of the tumor. 2. No new hepatic lesions. No abdominal adenopathy or metastatic disease. 3. Stable mild intrahepatic  biliary dilatation in  the right hepatic lobe distal to the lesion. 4. Somewhat tortuous and almost beaded appearance of the hepatic and portal vein radicles. Findings could be radiation related. 5. Cholelithiasis.  CT chest wo contrast IMPRESSION: 1. Persistent/stable moderate-sized bilateral pleural effusions with overlying atelectasis. 2. Stable small right pulmonary nodules. No new or progressive findings. Recommend continued surveillance. 3. No mediastinal or hilar mass or adenopathy. 4. Stable advanced three-vessel coronary artery calcifications. 5. Cholelithiasis. Aortic Atherosclerosis (ICD10-I70.0)   08/11/2020 Procedure   Y90 on 08/11/20 and 08/26/20 with Dr Laurence Ferrari    11/30/2020 Imaging   MRI Abdomen  IMPRESSION: 1. Today's study demonstrates progression of disease with enlarging central lesion in the right lobe of the liver involving portions of segments 7 and 8, now with evidence of some tumor thrombus extension into the intrahepatic portion of the inferior vena cava. This is associated with increasing intrahepatic biliary ductal dilatation in segment 7 of the liver. 2. In addition, there is some subtle hyperenhancement of the distal common bile duct at and immediately proximally to the level of the ampulla. There is also some subtle delayed enhancement around this region in the pancreatic head. This is of uncertain etiology and significance, but may be inflammatory and currently is not associated with proximal common bile duct dilatation. However, close attention on follow-up imaging is recommended. 3. Cholelithiasis without evidence of acute cholecystitis at this time. 4. Aortic atherosclerosis.     12/24/2020 - 03/21/2021 Chemotherapy   Restart Gemcitabine 2 weeks on/1 week off given disease progression beginning 12/24/20. Discontinued after 03/21/21 due to disease progression in liver.     03/16/2021 Imaging   MRI abdomen  IMPRESSION: No significant change in size of irregular hypovascular  mass in the medial right hepatic lobe.   New 1.1 cm hypovascular lesion with peripheral rim enhancement in liver dome, suspicious for metastatic disease.   New small right pleural effusion and mild ascites.   Cholelithiasis, without evidence of cholecystitis or biliary dilatation.   04/05/2021 -  Chemotherapy   Second-line FOLFOX q2weeks starting 04/05/21    04/05/2021 -  Chemotherapy   Immunotherapy with Durvalumab (Imfinzi) q4weeks starting 04/05/21. (in addition to second line FOLFOX)   06/23/2021 Imaging   MRI Abdomen IMPRESSION: 1. No significant change in subcapsular mass of the posterior right lobe of the liver. 2. No significant change in an additional small hyperenhancing lesion of the liver dome, which remains suspicious for a satellite lesion. 3. No new liver lesions. 4. 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 5. Cholelithiasis without evidence of acute cholecystitis. 6. Trace ascites.  CT Chest w/o contrast IMPRESSION: 1. Occasional small pulmonary nodules in the right lung are stable, for example a 6 mm nodule right lower lobe and a 3 mm nodule of the lateral segment right middle lobe. These are nonspecific and likely benign, incidental sequelae of infection or inflammation, metastatic disease not favored. Attention on follow-up. 2. Previously seen bilateral pleural effusions are resolved. 3. Hypodense lesion of the posterior right lobe of the liver, keeping with known cholangiocarcinoma and better characterized by same day MR. 4. Coronary artery disease.   10/21/2021 Imaging   EXAM: MRI ABDOMEN WITHOUT AND WITH CONTRAST  IMPRESSION: 1. Mild progression of dominant right hepatic lobe mass with involvement of the right portal, hepatic veins and minimal extension into the IVC. 2. Hepatic dome satellite lesion, new or more conspicuous today. 3. Cholelithiasis 4. Decrease in trace perihepatic and perisplenic ascites. 5.  Aortic Atherosclerosis  (ICD10-I70.0).  10/21/2021 Imaging   EXAM: CT CHEST WITHOUT CONTRAST  IMPRESSION: 1. Since 06/23/2021, enlargement of a right lower lobe and possible enlargement of a right middle lobe pulmonary nodule. Findings are suspicious for metastatic disease. 2. No thoracic adenopathy. 3. Coronary artery atherosclerosis. Aortic Atherosclerosis (ICD10-I70.0).   11/01/2021 - 01/11/2022 Chemotherapy   Patient is on Treatment Plan : PANCREAS FOLFIRI q14d     01/19/2022 Imaging   EXAM: CT CHEST WITHOUT CONTRAST  IMPRESSION: 1. Continued increase in size of lung nodule within the posterior right lung base which now measures 1 cm and is suspicious for metastatic disease. The remaining lung nodules are stable from 10/21/2021. 2. No thoracic adenopathy. 3. Aortic Atherosclerosis (ICD10-I70.0). Coronary artery calcifications.   01/19/2022 Imaging   EXAM: MRI ABDOMEN WITHOUT AND WITH CONTRAST  IMPRESSION: 1. No significant change in size or appearance of the large heterogeneous enhancing mass area in the right hepatic lobe as described. There is mild interval enlargement of the adjacent satellite lesion near the dome of the right lobe now measuring 1.9 cm. 2. Redemonstration of a pulmonary nodule in the right lower lobe measuring 9 mm. 3. Small volume ascites. 4. Splenomegaly. 5. Cholelithiasis.   12/08/2022 -  Chemotherapy   Patient is on Treatment Plan : UTERINE Lenvatinib (20) D1-21 + Pembrolizumab (200) D1 q21d         Interval history-: RILAN EILAND is a 77 y.o. male with oncologic history as above presenting [facility] today with chief complaint of discuss [place] results. Patient is accompanied by spouse who  provides additional history.   Patient reports since his visit yesterday he overall feels the same. He did eat a hamburger yesterday and proceeded to have 2 episodes of loose stool x 6 hours later. He continues to deny fever and abdominal pain. Still feels nauseas. He did not take  his blood thinner Eliquis yesterday or today incase he needs a procedure. No OTC medication taken prior to arrival. He had his [place] this morning and is curious about the results. No new complaints at this time.     ROS  All other systems are reviewed and are negative for acute change except as noted in the HPI.    No Known Allergies   Past Medical History:  Diagnosis Date   Arthritis    Diabetes ([place])    GERD (gastroesophageal reflux disease)    Hyperlipidemia    Hypertension    Intrahepatic cholangiocarcinoma ([place])      Past Surgical History:  Procedure Laterality Date   APPENDECTOMY  1980   CARDIOVERSION N/A 04/27/2020   Procedure: CARDIOVERSION;  Surgeon: Dorothy Spark, MD;  Location: [facility];  Service: Cardiovascular;  Laterality: N/A;   CARDIOVERSION N/A 05/19/2020   Procedure: CARDIOVERSION;  Surgeon: Elouise Munroe, MD;  Location: [facility];  Service: Cardiovascular;  Laterality: N/A;   COLONOSCOPY     IR 3D INDEPENDENT WKST  08/11/2020   IR 3D INDEPENDENT WKST  08/11/2020   IR ANGIOGRAM SELECTIVE EACH ADDITIONAL VESSEL  08/11/2020   IR ANGIOGRAM SELECTIVE EACH ADDITIONAL VESSEL  08/11/2020   IR ANGIOGRAM SELECTIVE EACH ADDITIONAL VESSEL  08/11/2020   IR ANGIOGRAM SELECTIVE EACH ADDITIONAL VESSEL  08/11/2020   IR ANGIOGRAM SELECTIVE EACH ADDITIONAL VESSEL  08/11/2020   IR ANGIOGRAM SELECTIVE EACH ADDITIONAL VESSEL  08/11/2020   IR ANGIOGRAM SELECTIVE EACH ADDITIONAL VESSEL  08/26/2020   IR ANGIOGRAM SELECTIVE EACH ADDITIONAL VESSEL  08/26/2020   IR ANGIOGRAM VISCERAL SELECTIVE  08/11/2020   IR  ANGIOGRAM VISCERAL SELECTIVE  08/26/2020   IR EMBO ARTERIAL NOT HEMORR HEMANG INC GUIDE ROADMAPPING  08/11/2020   IR EMBO TUMOR ORGAN ISCHEMIA INFARCT INC GUIDE ROADMAPPING  08/26/2020   IR IMAGING GUIDED PORT INSERTION  02/23/2020   IR RADIOLOGIST EVAL & MGMT  07/14/2020   IR RADIOLOGIST EVAL & MGMT  09/30/2020   IR RADIOLOGIST EVAL & MGMT  12/01/2020   IR US GUIDE VASC  ACCESS LEFT  08/26/2020   IR US GUIDE VASC ACCESS RIGHT  08/11/2020    Social History   Socioeconomic History   Marital status: Married    Spouse name: Not on file   Number of children: 3   Years of education: Not on file   Highest education level: Not on file  Occupational History   Not on file  Tobacco Use   Smoking status: Never   Smokeless tobacco: Current    Types: Chew   Tobacco comments:    for 60 years, 1-2 daily   Vaping Use   Vaping Use: Never used  Substance and Sexual Activity   Alcohol use: Not Currently    Comment: occasionally   Drug use: Never   Sexual activity: Not on file  Other Topics Concern   Not on file  Social History Narrative   Not on file   Social Determinants of Health   Financial Resource Strain: Not on file  Food Insecurity: Not on file  Transportation Needs: No Transportation Needs (05/21/2020)   PRAPARE - Transportation    Lack of Transportation (Medical): No    Lack of Transportation (Non-Medical): No  Physical Activity: Insufficiently Active (05/21/2020)   Exercise Vital Sign    Days of Exercise per Week: 1 day    Minutes of Exercise per Session: 50 min  Stress: Not on file  Social Connections: Unknown (05/21/2020)   Social Connection and Isolation Panel [NHANES]    Frequency of Communication with Friends and Family: More than three times a week    Frequency of Social Gatherings with Friends and Family: More than three times a week    Attends Religious Services: Not on Advertising copywriter or Organizations: Not on file    Attends Archivist Meetings: Not on file    Marital Status: Married  Human resources officer Violence: Not on file    Family History  Problem Relation Age of Onset   Heart attack Mother    Stroke Father    Colon cancer Neg Hx    Esophageal cancer Neg Hx    Stomach cancer Neg Hx    Rectal cancer Neg Hx    Colon polyps Neg Hx      Current Outpatient Medications:    allopurinol (ZYLOPRIM) 100 MG  tablet, Take 100 mg by mouth daily., Disp: , Rfl:    apixaban (ELIQUIS) 5 MG TABS tablet, TAKE 1 TABLET TWICE DAILY, Disp: 180 tablet, Rfl: 1   atorvastatin (LIPITOR) 40 MG tablet, Take 1 tablet (40 mg total) by mouth daily., Disp: 90 tablet, Rfl: 3   fenofibrate (TRICOR) 145 MG tablet, Take 145 mg by mouth daily., Disp: , Rfl:    finasteride (PROSCAR) 5 MG tablet, Take 5 mg by mouth daily., Disp: , Rfl:    furosemide (LASIX) 40 MG tablet, TAKE 2 TABLETS BY MOUTH 2 TIMES DAILY. (Patient taking differently: Take 40 mg by mouth 3 (three) times a week. TAKE ONE TABLET (40 mg) MONDAY, WEDNESDAY, AND FRIDAY.), Disp: 360 tablet, Rfl: 3  gabapentin (NEURONTIN) 100 MG capsule, Take 100 mg by mouth at bedtime., Disp: , Rfl:    hydrALAZINE (APRESOLINE) 50 MG tablet, Take 1.5 tablets (75 mg total) by mouth 3 (three) times daily., Disp: 135 tablet, Rfl: 3   lenvatinib 20 mg daily dose (LENVIMA) 2 x 10 MG capsule, Take 2 capsules (20 mg total) by mouth daily., Disp: 60 capsule, Rfl: 0   lidocaine-prilocaine (EMLA) cream, Apply 1 application topically as needed., Disp: 30 g, Rfl: 3   metFORMIN (GLUCOPHAGE-XR) 500 MG 24 hr tablet, , Disp: , Rfl:    metoprolol succinate (TOPROL-XL) 50 MG 24 hr tablet, Take 1 tablet (50 mg total) by mouth daily. Take with or immediately following a meal., Disp: 90 tablet, Rfl: 3   olmesartan (BENICAR) 40 MG tablet, Take 1 tablet (40 mg total) by mouth daily., Disp: 90 tablet, Rfl: 3   ondansetron (ZOFRAN) 8 MG tablet, Take 1 tablet (8 mg total) by mouth every 8 (eight) hours as needed for nausea or vomiting., Disp: 30 tablet, Rfl: 1   pantoprazole (PROTONIX) 40 MG tablet, TAKE 1 TABLET BY MOUTH TWICE A DAY, Disp: 180 tablet, Rfl: 2   Pembrolizumab (KEYTRUDA IV), Inject into the vein., Disp: , Rfl:    potassium chloride SA (KLOR-CON M20) 20 MEQ tablet, Take 1 tablet (20 mEq total) by mouth daily., Disp: 7 tablet, Rfl: 0   sucralfate (CARAFATE) 1 g tablet, Take 1 tablet (1 g total)  by mouth every 6 (six) hours as needed. Please schedule a yearly follow up for further refills: [phone], Disp: 60 tablet, Rfl: 0   tamsulosin (FLOMAX) 0.4 MG CAPS capsule, Take 0.4 mg by mouth daily., Disp: , Rfl:   PHYSICAL EXAM: ECOG FS:3 - Symptomatic, >50% confined to bed    Vitals:   12/20/22 0945  BP: 126/89  Pulse: 81  Resp: 19  Temp: 97.9 F (36.6 C)  TempSrc: Temporal  SpO2: 99%  Weight: 194 lb 3.2 oz (88.1 kg)  Height: '5\' 9"'$  (1.753 m)   Physical Exam Vitals and nursing note reviewed.  Constitutional:      Appearance: He is well-developed. He is not ill-appearing or toxic-appearing.  HENT:     Head: Normocephalic.     Nose: Nose normal.  Eyes:     Conjunctiva/sclera: Conjunctivae normal.  Neck:     Vascular: No JVD.  Cardiovascular:     Rate and Rhythm: Normal rate and regular rhythm.     Pulses: Normal pulses.     Heart sounds: Normal heart sounds.  Pulmonary:     Effort: Pulmonary effort is normal.     Breath sounds: Normal breath sounds.  Abdominal:     General: There is distension.     Tenderness: There is no abdominal tenderness. There is no right CVA tenderness, left CVA tenderness, guarding or rebound.     Hernia: No hernia is present.     Comments: Positive fluid wave without pain  Musculoskeletal:     Cervical back: Normal range of motion.     Right lower leg: No edema.     Left lower leg: No edema.  Skin:    General: Skin is warm and dry.  Neurological:     Mental Status: He is oriented to person, place, and time.        LABORATORY DATA: I have reviewed the data as listed    Latest Ref Rng & Units 12/19/2022   10:41 AM 12/08/2022    8:25 AM 11/23/2022  11:17 AM  CBC  WBC 4.0 - 10.5 K/uL 6.8  4.1  5.5   Hemoglobin 13.0 - 17.0 g/dL 15.9  12.5  12.3   Hematocrit 39.0 - 52.0 % 47.1  37.5  36.2   Platelets 150 - 400 K/uL 116  101  111         Latest Ref Rng & Units 12/19/2022   10:41 AM 12/08/2022    8:25 AM 11/23/2022   11:17 AM   CMP  Glucose 70 - 99 mg/dL 82  86  84   BUN 8 - 23 mg/dL 35  17  15   Creatinine 0.61 - 1.24 mg/dL 1.86  1.44  1.25   Sodium 135 - 145 mmol/L 137  137  140   Potassium 3.5 - 5.1 mmol/L 3.7  3.7  3.1   Chloride 98 - 111 mmol/L 103  106  109   CO2 22 - 32 mmol/L '24  23  24   '$ Calcium 8.9 - 10.3 mg/dL 9.0  8.8  8.6   Total Protein 6.5 - 8.1 g/dL 7.0  6.3  6.4   Total Bilirubin 0.3 - 1.2 mg/dL 2.0  1.1  1.0   Alkaline Phos 38 - 126 U/L 80  70  81   AST 15 - 41 U/L 88  47  60   ALT 0 - 44 U/L 41  25  38        RADIOGRAPHIC STUDIES (from last 24 hours if applicable) I have personally reviewed the radiological images as listed and agreed with the findings in the report. US Abdomen Complete  Result Date: 12/20/2022 CLINICAL DATA:  Abdominal distension. Elevated bilirubin. History of cholangiocarcinoma. EXAM: ABDOMEN ULTRASOUND COMPLETE COMPARISON:  MRI 11/21/2022 FINDINGS: Gallbladder: 18 mm gallstone evident. Gallbladder wall thickening noted up to 8 mm although gallbladder is under distended. Common bile duct: Diameter: 3 mm Liver: Nodular heterogeneous liver parenchyma. Multiple liver lesions again noted, better characterized on recent abdominal MRI. Portal vein is patent on color Doppler imaging with normal direction of blood flow towards the liver. IVC: Obscured by bowel gas. Pancreas: Obscured by bowel gas. Spleen: Size and appearance within normal limits. Right Kidney: Length: 10.4 cm. Echogenicity within normal limits. No mass or hydronephrosis visualized. Left Kidney: Length: 10.6 cm. Echogenicity within normal limits. No mass or hydronephrosis visualized. Abdominal aorta: Largely obscured by overlying bowel gas. Other findings: Large volume ascites. IMPRESSION: 1. Limited study secondary to body habitus and large volume bowel gas. 2. Large volume ascites. 3. 18 mm gallstone. Gallbladder wall thickening noted up to 8 mm although gallbladder is under distended. Given ascites and associated  liver disease, gallbladder wall thickening is nonspecific. 4. Nodular heterogeneous liver parenchyma with multiple liver lesions again noted, better characterized on recent abdominal MRI. Electronically Signed   By: Misty Stanley M.D.   On: 12/20/2022 09:20        Visit Diagnosis: 1. Abdominal distension   2. Intrahepatic cholangiocarcinoma ([place])      Orders Placed This Encounter  Procedures   US Paracentesis    Standing Status:   Future    Standing Expiration Date:   12/20/2023    Order Specific Question:   If therapeutic, is there a maximum amount of fluid to be removed?    Answer:   No    Order Specific Question:   Are labs required for specimen collection?    Answer:   Yes    Order Specific Question:   Lab orders requested (  DO NOT place separate lab orders, these will be automatically ordered during procedure specimen collection):    Answer:   Cytology - Non Pap    Order Specific Question:   Is Albumin medication needed?    Answer:   No    Order Specific Question:   Reason for Exam (SYMPTOM  OR DIAGNOSIS REQUIRED)    Answer:   large volume ascities. intrahepatic cholangiocarcinoma    Order Specific Question:   Preferred imaging location?    Answer:   [facility]    All questions were answered. The patient knows to call the clinic with any problems, questions or concerns. No barriers to learning was detected.  I have spent a total of 20 minutes minutes of face-to-face and non-face-to-face time, preparing to see the patient, obtaining and/or reviewing separately obtained history, performing a medically appropriate examination, counseling and educating the patient, ordering tests, documenting clinical information in the electronic health record, and care coordination (communications with other health care professionals or caregivers).    Thank you for allowing me to participate in the care of this patient.    Barrie Folk, PA-C Department of  Hematology/Oncology [facility] at [facility] Phone: [phone]  [phone]    12/20/2022 12:53 PM  Addendum I have seen the patient, examined him. I agree with the assessment and and plan and have edited the notes.   Patient has developed abdominal bloating and discomfort due to new onset ascites, he has very low appetite, very fatigued, a symptom is likely related to the cancer progression, could be partially related to treatment also.  I will stop lenvatinib and Keytruda, he is not a candidate for systemic therapy due to his overall poor performance status.  Will arrange paracentesis, I discussed palliative care and hospice with patient and his wife.  Patient would like to think about it.  He is scheduled to see me back next Friday, I will cancel his infusion, but plan to follow-up with him as scheduled.  [name] MD 12/20/2022

## 2022-12-20 NOTE — Progress Notes (Unsigned)
Pt made aware in office of appt for US paracentesis at 1pm on 2/1. Pt and wife verbalized understanding

## 2022-12-21 ENCOUNTER — Other Ambulatory Visit (HOSPITAL_COMMUNITY): Payer: Self-pay

## 2022-12-21 ENCOUNTER — Encounter: Payer: Self-pay | Admitting: Hematology

## 2022-12-21 ENCOUNTER — Ambulatory Visit (HOSPITAL_COMMUNITY)
Admission: RE | Admit: 2022-12-21 | Discharge: 2022-12-21 | Disposition: A | Discharge status: Home / Self Care | Payer: Medicare HMO | Source: Ambulatory Visit | Attending: Physician Assistant | Admitting: Physician Assistant

## 2022-12-21 DIAGNOSIS — R140 Abdominal distension (gaseous): Secondary | ICD-10-CM | POA: Diagnosis not present

## 2022-12-21 DIAGNOSIS — R188 Other ascites: Secondary | ICD-10-CM | POA: Diagnosis not present

## 2022-12-21 DIAGNOSIS — C221 Intrahepatic bile duct carcinoma: Secondary | ICD-10-CM

## 2022-12-21 MED ORDER — LIDOCAINE HCL 1 % IJ SOLN
INTRAMUSCULAR | Status: AC
  Administered 2022-12-21: 15 mL
  Filled 2022-12-21: qty 20

## 2022-12-21 NOTE — Procedures (Signed)
PROCEDURE SUMMARY:  Successful image-guided paracentesis from the right upper abdomen.  Yielded 6 liters of clear yellow fluid.  No immediate complications.  EBL = trace. Patient tolerated well.   Specimen was sent for labs.  Please see imaging section of Epic for full dictation.   Lura Em PA-C 12/21/2022 2:34 PM

## 2022-12-25 ENCOUNTER — Other Ambulatory Visit: Payer: Self-pay

## 2022-12-25 LAB — CYTOLOGY - NON PAP

## 2022-12-29 ENCOUNTER — Ambulatory Visit: Payer: Medicare HMO

## 2022-12-29 ENCOUNTER — Inpatient Hospital Stay: Payer: Medicare HMO | Attending: Hematology | Admitting: Hematology

## 2022-12-29 ENCOUNTER — Other Ambulatory Visit: Payer: Self-pay

## 2022-12-29 ENCOUNTER — Encounter: Payer: Self-pay | Admitting: Hematology

## 2022-12-29 ENCOUNTER — Inpatient Hospital Stay: Payer: Medicare HMO

## 2022-12-29 VITALS — BP 135/83 | HR 60 | Temp 97.7°F | Resp 17 | Wt 200.3 lb

## 2022-12-29 DIAGNOSIS — R188 Other ascites: Secondary | ICD-10-CM | POA: Insufficient documentation

## 2022-12-29 DIAGNOSIS — C221 Intrahepatic bile duct carcinoma: Secondary | ICD-10-CM | POA: Diagnosis not present

## 2022-12-29 DIAGNOSIS — Z7189 Other specified counseling: Secondary | ICD-10-CM

## 2022-12-29 DIAGNOSIS — Z66 Do not resuscitate: Secondary | ICD-10-CM | POA: Diagnosis not present

## 2022-12-29 DIAGNOSIS — E860 Dehydration: Secondary | ICD-10-CM

## 2022-12-29 DIAGNOSIS — Z79899 Other long term (current) drug therapy: Secondary | ICD-10-CM | POA: Insufficient documentation

## 2022-12-29 DIAGNOSIS — Z95828 Presence of other vascular implants and grafts: Secondary | ICD-10-CM

## 2022-12-29 LAB — CBC WITH DIFFERENTIAL (CANCER CENTER ONLY)
Abs Immature Granulocytes: 0.01 10*3/uL (ref 0.00–0.07)
Basophils Absolute: 0 10*3/uL (ref 0.0–0.1)
Basophils Relative: 1 %
Eosinophils Absolute: 0.1 10*3/uL (ref 0.0–0.5)
Eosinophils Relative: 1 %
HCT: 38.6 % — ABNORMAL LOW (ref 39.0–52.0)
Hemoglobin: 13.5 g/dL (ref 13.0–17.0)
Immature Granulocytes: 0 %
Lymphocytes Relative: 16 %
Lymphs Abs: 1 10*3/uL (ref 0.7–4.0)
MCH: 29.9 pg (ref 26.0–34.0)
MCHC: 35 g/dL (ref 30.0–36.0)
MCV: 85.4 fL (ref 80.0–100.0)
Monocytes Absolute: 0.8 10*3/uL (ref 0.1–1.0)
Monocytes Relative: 12 %
Neutro Abs: 4.2 10*3/uL (ref 1.7–7.7)
Neutrophils Relative %: 70 %
Platelet Count: 123 10*3/uL — ABNORMAL LOW (ref 150–400)
RBC: 4.52 MIL/uL (ref 4.22–5.81)
RDW: 18.7 % — ABNORMAL HIGH (ref 11.5–15.5)
WBC Count: 6.1 10*3/uL (ref 4.0–10.5)
nRBC: 0 % (ref 0.0–0.2)

## 2022-12-29 LAB — CMP (CANCER CENTER ONLY)
ALT: 58 U/L — ABNORMAL HIGH (ref 0–44)
AST: 94 U/L — ABNORMAL HIGH (ref 15–41)
Albumin: 3.1 g/dL — ABNORMAL LOW (ref 3.5–5.0)
Alkaline Phosphatase: 130 U/L — ABNORMAL HIGH (ref 38–126)
Anion gap: 6 (ref 5–15)
BUN: 31 mg/dL — ABNORMAL HIGH (ref 8–23)
CO2: 27 mmol/L (ref 22–32)
Calcium: 8.9 mg/dL (ref 8.9–10.3)
Chloride: 97 mmol/L — ABNORMAL LOW (ref 98–111)
Creatinine: 1.69 mg/dL — ABNORMAL HIGH (ref 0.61–1.24)
GFR, Estimated: 42 mL/min — ABNORMAL LOW (ref >60)
Glucose, Bld: 122 mg/dL — ABNORMAL HIGH (ref 70–99)
Potassium: 3.8 mmol/L (ref 3.5–5.1)
Sodium: 130 mmol/L — ABNORMAL LOW (ref 135–145)
Total Bilirubin: 1.5 mg/dL — ABNORMAL HIGH (ref 0.3–1.2)
Total Protein: 6.5 g/dL (ref 6.5–8.1)

## 2022-12-29 MED ORDER — HEPARIN SOD (PORK) LOCK FLUSH 100 UNIT/ML IV SOLN
500.0000 [IU] | Freq: Once | INTRAVENOUS | Status: AC
  Administered 2022-12-29: 500 [IU]

## 2022-12-29 MED ORDER — SODIUM CHLORIDE 0.9% FLUSH
10.0000 mL | Freq: Once | INTRAVENOUS | Status: AC
  Administered 2022-12-29: 10 mL

## 2022-12-29 NOTE — Assessment & Plan Note (Signed)
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 -s/p first line Cisplatin and Gemcitabine, 09/29/19 - 07/19/20. Cisplatin discontinued 03/23/20 due to Afib. -he progressed through single agent gemcitabine (12/24/20 - 03/16/21), FOLFOX plus durvalumab (04/05/21 - 10/21/21), FOLFIRI (11/01/21 - 01/19/22), and then tried ivosidenib (02/02/22 - 04/2022) -he was switched to [employer] on 05/15/22. He tolerated low-dose 80 mg daily well, but developed multiple side effects on 120 mg daily. He is tolerating 76m for 3 weeks on, 1 week off with no noticeable side effects. -due to disease progression, treatment changed to Lenvatinib and Keytruda, plan to start today   -treatment has been held since last week due to very poor performance status, anorexia, and new abdominal ascites

## 2022-12-29 NOTE — Assessment & Plan Note (Signed)
-  We previously discussed the incurable nature of his cancer, and the overall poor prognosis, especially if he does not have good response to chemotherapy or progress on chemo -The patient understands the goal of care is palliative. -I recommended DNR/DNI and he agreed in 01/2022.DNR order entered today  -Due to his rapidly declining, I recommend home hospice care, discussed the logistics and the benefit in detail.

## 2022-12-29 NOTE — Progress Notes (Signed)
[place]   Telephone:(336) [phone] Fax:(336) [phone]   Clinic Follow up Note   Patient Care Team: Marga Hoots, NP as PCP - General (Adult Health Nurse Practitioner) Lorretta Harp, MD as PCP - Cardiology (Cardiology) [name], MD as Consulting Physician (General Surgery) [name], [name], MD as Consulting Physician (Gastroenterology) [name], MD as Consulting Physician (Hematology) Lorretta Harp, MD as Consulting Physician (Cardiology)  Date of Service:  12/29/2022  CHIEF COMPLAINT: f/u of cholangiocarcinoma     CURRENT THERAPY:  Supportive care  ASSESSMENT:  Brandon Sherman is a 77 y.o. 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 -s/p first line Cisplatin and Gemcitabine, 09/29/19 - 07/19/20. Cisplatin discontinued 03/23/20 due to Afib. -he progressed through single agent gemcitabine (12/24/20 - 03/16/21), FOLFOX plus durvalumab (04/05/21 - 10/21/21), FOLFIRI (11/01/21 - 01/19/22), and then tried ivosidenib (02/02/22 - 04/2022) -he was switched to [place] on 05/15/22. He tolerated low-dose 80 mg daily well, but developed multiple side effects on 120 mg daily. He is tolerating 56m for 3 weeks on, 1 week off with no noticeable side effects. -due to disease progression, treatment changed to Lenvatinib and Keytruda recently  -treatment has been held since last week due to very poor performance status, anorexia, and new abdominal ascites -His nausea and low appetite have resolved now, he has recovered well from recent treatment. -There is no other standard treatment available.  Due to his limited performance status, I recommend palliative care and observation for now.  He does not want palliative home care for now, I will see him back in a months.  Goals of care, counseling/discussion -We  previously discussed the incurable nature of his cancer, and the overall poor prognosis, especially if he does not have good response to chemotherapy or progress on chemo -The patient understands the goal of care is palliative. -I recommended DNR/DNI and he agreed in 01/2022.DNR order entered today  -Due to his rapidly declining, I recommend home palliative and hospice care, discussed the logistics and the benefit in detail. He is not ready yet now.     PLAN: -f/u in 4 weeks -He will call me if he needs repeated paracentesis for his ascites  SUMMARY OF ONCOLOGIC HISTORY: Oncology History Overview Note  Cancer Staging Intrahepatic cholangiocarcinoma ([place] Staging form: Intrahepatic Bile Duct, AJCC 8th Edition - Clinical stage from 08/19/2019: Stage II (cT2, cN0, cM0) - Signed by FTruitt Merle MD on 08/19/2019    Intrahepatic cholangiocarcinoma ([place]  06/28/2019 Imaging   CT AP W Contrast 06/28/19  IMPRESSION: 1. Heterogeneous hypodensity posteriorly in the right hepatic lobe and potentially extending into the caudate lobe suspicious for a mass. There is felt to be truncation of branches of the portal vein in this vicinity and some narrowing of the hepatic vein, as well as triangular-shaped regions of abnormal hypoenhancement posteriorly in the right hepatic lobe likely representing downstream vascular effects. Cannot exclude malignancy such as cholangiocarcinoma or hepatocellular carcinoma, and follow up hepatic protocol MRI with and without contrast is recommended to further characterize. 2. 4 mm right middle lobe pulmonary nodule is likely benign but may merit surveillance. 3. Cholelithiasis. 4.  Aortic Atherosclerosis (ICD10-I70.0). 5. Prostatomegaly. 6. Mild impingement at L3-4 and L4-5.   07/31/2019 Imaging   MRI Liver 07/31/19 IMPRESSION: 1. 7.3 cm in long axis mass in the right hepatic lobe spanning  into the caudate lobe, high suspicion for malignancy such as hepatocellular carcinoma  or cholangiocarcinoma. Suspected effacement or occlusion of the right hepatic vein and posterior branches of the right portal vein. Two smaller tumor nodules along the posterior periphery of the dominant mass. Tissue diagnosis is recommended. 2. No findings of pathologic adenopathy or distant metastatic spread. 3. 9 mm gallstone in the gallbladder. There is mild gallbladder wall thickening which may be from nondistention, correlate clinically in assessing for cholecystitis. 4.  Aortic Atherosclerosis (ICD10-I70.0). 5. Mild diffuse hepatic steatosis.   08/11/2019 Initial Biopsy   DIAGNOSIS: 08/11/19  A. LIVER, RIGHT, BIOPSY:  - Adenocarcinoma.   08/18/2019 Imaging   CT Chest 08/18/19  IMPRESSION: 1. Multiple pulmonary nodules largest at approximately 7 mm in the right lower lobe, nonspecific but concerning given findings in the liver. 2. No signs of definitive metastatic disease, also with mildly enlarged upper abdominal lymph nodes as discussed.   Aortic Atherosclerosis (ICD10-I70.0).   08/19/2019 Initial Diagnosis   Intrahepatic cholangiocarcinoma ([place])   08/19/2019 Cancer Staging   Staging form: Intrahepatic Bile Duct, AJCC 8th Edition - Clinical stage from 08/19/2019: Stage II (cT2, cN0, cM0) - Signed by [name], MD on 08/19/2019   09/29/2019 - 07/19/2020 Chemotherapy   Cisplatin and Gemcitabine 2 weeks on/1 week off starting 09/29/19. Cisplatin held from cycle 9 (03/23/20) due to fluid status/Afib. He is now on maintenance Gemcitabine. Held after 07/19/20 to proceed with liver target therapy.     10/09/2019 Imaging   CT AP IMPRESSION: 1. The dominant right hepatic lobe mass is minimally reduced in size compared to prior exams, currently measuring 6.2 by 5.3 cm, previously 6.2 by 5.6 cm. However, there is a new small hypodense lesion centrally in the right hepatic lobe which is suspicious for a new small focus of tumor. Accordingly this is an overall mixed appearance. 2. Continued  hypoenhancement in the liver downstream of the tumor likely attributable to narrowing or occlusion of the right hepatic vein by the tumor. By virtue of its location the tumor wraps around the intrahepatic portion of the IVC. 3. 4 mm right middle lobe pulmonary nodule, stable compared to earliest available comparison of 07/18/2019. Surveillance of the patient's pulmonary nodules is recommended. 4. Other imaging findings of potential clinical significance: Coronary atherosclerosis. Cholelithiasis. Prominent stool throughout the colon favors constipation. Moderate prostatomegaly with heterogeneous enhancement of the prostate gland. Lumbar spondylosis and degenerative disc disease causing mild bilateral foraminal impingement at L3-4 and L4-5.   Aortic Atherosclerosis (ICD10-I70.0).   12/24/2019 Imaging   CT CAP W Contrast  IMPRESSION: 1. Right hepatic lobe mass and adjacent right hepatic lobe nodules appear grossly stable. No evidence of distant metastatic disease. 2. Continued stability of small pulmonary nodules. Recommend attention on follow-up. 3. Cholelithiasis. 4. Enlarged prostate. 5. Aortic atherosclerosis (ICD10-I70.0). Coronary artery calcification.   02/23/2020 Procedure   He had PAC placed on 02/23/20.    04/08/2020 Imaging   CT CAP  IMPRESSION: 1. Stable or minimally decreased size of a very ill-defined, hypodense and somewhat retractile appearing mass of the central right lobe of the liver abutting the inferior vena cava and right portal vein, measuring approximately 5.7 x 5.0 cm, previously 6.2 x 5.2 cm when measured similarly. Findings are consistent with stable or minimally improved cholangiocarcinoma. 2. Small hypodense nodules of the right lobe identified on prior examination are poorly appreciated on this single phase contrast examination although not grossly changed. Attention on follow-up. 3. There are new, moderate bilateral pleural effusions  and associated atelectasis  or consolidation as well as a new small pericardial effusion, nonspecific although generally concerning and suspicious for malignant effusions. There is no directly visualized pleural nodularity. 4. Multiple small pulmonary nodules are stable.  No new nodules. 5. Coronary artery disease. Aortic Atherosclerosis (ICD10-I70.0).   07/06/2020 Imaging   MRI ABD IMPRESSION: 1. Interval decrease in size of the right hepatic lobe lesion in the medial aspect of the segment 7. Findings would suggest a good response to treatment with some contraction of the tumor. 2. No new hepatic lesions. No abdominal adenopathy or metastatic disease. 3. Stable mild intrahepatic biliary dilatation in the right hepatic lobe distal to the lesion. 4. Somewhat tortuous and almost beaded appearance of the hepatic and portal vein radicles. Findings could be radiation related. 5. Cholelithiasis.  CT chest wo contrast IMPRESSION: 1. Persistent/stable moderate-sized bilateral pleural effusions with overlying atelectasis. 2. Stable small right pulmonary nodules. No new or progressive findings. Recommend continued surveillance. 3. No mediastinal or hilar mass or adenopathy. 4. Stable advanced three-vessel coronary artery calcifications. 5. Cholelithiasis. Aortic Atherosclerosis (ICD10-I70.0)   08/11/2020 Procedure   Y90 on 08/11/20 and 08/26/20 with Dr Laurence Ferrari    11/30/2020 Imaging   MRI Abdomen  IMPRESSION: 1. Today's study demonstrates progression of disease with enlarging central lesion in the right lobe of the liver involving portions of segments 7 and 8, now with evidence of some tumor thrombus extension into the intrahepatic portion of the inferior vena cava. This is associated with increasing intrahepatic biliary ductal dilatation in segment 7 of the liver. 2. In addition, there is some subtle hyperenhancement of the distal common bile duct at and immediately proximally to the level of the ampulla. There is  also some subtle delayed enhancement around this region in the pancreatic head. This is of uncertain etiology and significance, but may be inflammatory and currently is not associated with proximal common bile duct dilatation. However, close attention on follow-up imaging is recommended. 3. Cholelithiasis without evidence of acute cholecystitis at this time. 4. Aortic atherosclerosis.     12/24/2020 - 03/21/2021 Chemotherapy   Restart Gemcitabine 2 weeks on/1 week off given disease progression beginning 12/24/20. Discontinued after 03/21/21 due to disease progression in liver.     03/16/2021 Imaging   MRI abdomen  IMPRESSION: No significant change in size of irregular hypovascular mass in the medial right hepatic lobe.   New 1.1 cm hypovascular lesion with peripheral rim enhancement in liver dome, suspicious for metastatic disease.   New small right pleural effusion and mild ascites.   Cholelithiasis, without evidence of cholecystitis or biliary dilatation.   04/05/2021 -  Chemotherapy   Second-line FOLFOX q2weeks starting 04/05/21    04/05/2021 -  Chemotherapy   Immunotherapy with Durvalumab ([facility]finzi) q4weeks starting 04/05/21. (in addition to second line FOLFOX)   06/23/2021 Imaging   MRI Abdomen IMPRESSION: 1. No significant change in subcapsular mass of the posterior right lobe of the liver. 2. No significant change in an additional small hyperenhancing lesion of the liver dome, which remains suspicious for a satellite lesion. 3. No new liver lesions. 4. 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 5. Cholelithiasis without evidence of acute cholecystitis. 6. Trace ascites.  CT Chest w/o contrast IMPRESSION: 1. Occasional small pulmonary nodules in the right lung are stable, for example a 6 mm nodule right lower lobe and a 3 mm nodule of the lateral segment right middle lobe. These are nonspecific and likely benign, incidental sequelae of infection or inflammation,  metastatic disease not favored. Attention on follow-up. 2. Previously seen bilateral pleural effusions are resolved. 3. Hypodense lesion of the posterior right lobe of the liver, keeping with known cholangiocarcinoma and better characterized by same day MR. 4. Coronary artery disease.   10/21/2021 Imaging   EXAM: MRI ABDOMEN WITHOUT AND WITH CONTRAST  IMPRESSION: 1. Mild progression of dominant right hepatic lobe mass with involvement of the right portal, hepatic veins and minimal extension into the IVC. 2. Hepatic dome satellite lesion, new or more conspicuous today. 3. Cholelithiasis 4. Decrease in trace perihepatic and perisplenic ascites. 5.  Aortic Atherosclerosis (ICD10-I70.0).   10/21/2021 Imaging   EXAM: CT CHEST WITHOUT CONTRAST  IMPRESSION: 1. Since 06/23/2021, enlargement of a right lower lobe and possible enlargement of a right middle lobe pulmonary nodule. Findings are suspicious for metastatic disease. 2. No thoracic adenopathy. 3. Coronary artery atherosclerosis. Aortic Atherosclerosis (ICD10-I70.0).   11/01/2021 - 01/11/2022 Chemotherapy   Patient is on Treatment Plan : PANCREAS FOLFIRI q14d     01/19/2022 Imaging   EXAM: CT CHEST WITHOUT CONTRAST  IMPRESSION: 1. Continued increase in size of lung nodule within the posterior right lung base which now measures 1 cm and is suspicious for metastatic disease. The remaining lung nodules are stable from 10/21/2021. 2. No thoracic adenopathy. 3. Aortic Atherosclerosis (ICD10-I70.0). Coronary artery calcifications.   01/19/2022 Imaging   EXAM: MRI ABDOMEN WITHOUT AND WITH CONTRAST  IMPRESSION: 1. No significant change in size or appearance of the large heterogeneous enhancing mass area in the right hepatic lobe as described. There is mild interval enlargement of the adjacent satellite lesion near the dome of the right lobe now measuring 1.9 cm. 2. Redemonstration of a pulmonary nodule in the right lower  lobe measuring 9 mm. 3. Small volume ascites. 4. Splenomegaly. 5. Cholelithiasis.   12/08/2022 -  Chemotherapy   Patient is on Treatment Plan : UTERINE Lenvatinib (20) D1-21 + Pembrolizumab (200) D1 q21d        INTERVAL HISTORY:  Brandon Sherman is here for a follow up cholangiocarcinoma    of  He was last seen by me on 12/08/2022 He presents to the clinic accompanied by wife.Pt reports appetite is good. Pt states his stomach is fine but feels bloated when he eat. Pt denies having pain and nausea. Pt states that he sleeps well at night.       All other systems were reviewed with the patient and are negative.  MEDICAL HISTORY:  Past Medical History:  Diagnosis Date   Arthritis    Diabetes ([place])    GERD (gastroesophageal reflux disease)    Hyperlipidemia    Hypertension    Intrahepatic cholangiocarcinoma ([place])     SURGICAL HISTORY: Past Surgical History:  Procedure Laterality Date   APPENDECTOMY  1980   CARDIOVERSION N/A 04/27/2020   Procedure: CARDIOVERSION;  Surgeon: Dorothy Spark, MD;  Location: [facility] ENDOSCOPY;  Service: Cardiovascular;  Laterality: N/A;   CARDIOVERSION N/A 05/19/2020   Procedure: CARDIOVERSION;  Surgeon: Elouise Munroe, MD;  Location: [facility];  Service: Cardiovascular;  Laterality: N/A;   COLONOSCOPY     IR 3D INDEPENDENT [facility]KST  08/11/2020   IR 3D INDEPENDENT [facility]KST  08/11/2020   IR ANGIOGRAM SELECTIVE EACH ADDITIONAL VESSEL  08/11/2020   IR ANGIOGRAM SELECTIVE EACH ADDITIONAL VESSEL  08/11/2020   IR ANGIOGRAM SELECTIVE EACH ADDITIONAL VESSEL  08/11/2020   IR ANGIOGRAM SELECTIVE EACH ADDITIONAL VESSEL  08/11/2020   IR ANGIOGRAM SELECTIVE EACH ADDITIONAL VESSEL  08/11/2020  IR ANGIOGRAM SELECTIVE EACH ADDITIONAL VESSEL  08/11/2020   IR ANGIOGRAM SELECTIVE EACH ADDITIONAL VESSEL  08/26/2020   IR ANGIOGRAM SELECTIVE EACH ADDITIONAL VESSEL  08/26/2020   IR ANGIOGRAM VISCERAL SELECTIVE  08/11/2020   IR ANGIOGRAM VISCERAL SELECTIVE  08/26/2020   IR EMBO  ARTERIAL NOT HEMORR HEMANG INC GUIDE ROADMAPPING  08/11/2020   IR EMBO TUMOR ORGAN ISCHEMIA INFARCT INC GUIDE ROADMAPPING  08/26/2020   IR IMAGING GUIDED PORT INSERTION  02/23/2020   IR RADIOLOGIST EVAL & MGMT  07/14/2020   IR RADIOLOGIST EVAL & MGMT  09/30/2020   IR RADIOLOGIST EVAL & MGMT  12/01/2020   IR US GUIDE VASC ACCESS LEFT  08/26/2020   IR US GUIDE VASC ACCESS RIGHT  08/11/2020    I have reviewed the social history and family history with the patient and they are unchanged from previous note.  ALLERGIES:  has No Known Allergies.  MEDICATIONS:  Current Outpatient Medications  Medication Sig Dispense Refill   allopurinol (ZYLOPRIM) 100 MG tablet Take 100 mg by mouth daily.     apixaban (ELIQUIS) 5 MG TABS tablet TAKE 1 TABLET TWICE DAILY 180 tablet 1   atorvastatin (LIPITOR) 40 MG tablet Take 1 tablet (40 mg total) by mouth daily. 90 tablet 3   fenofibrate (TRICOR) 145 MG tablet Take 145 mg by mouth daily.     finasteride (PROSCAR) 5 MG tablet Take 5 mg by mouth daily.     furosemide (LASIX) 40 MG tablet TAKE 2 TABLETS BY MOUTH 2 TIMES DAILY. (Patient taking differently: Take 40 mg by mouth 3 (three) times a week. TAKE ONE TABLET (40 mg) MONDAY, WEDNESDAY, AND FRIDAY.) 360 tablet 3   gabapentin (NEURONTIN) 100 MG capsule Take 100 mg by mouth at bedtime.     hydrALAZINE (APRESOLINE) 50 MG tablet Take 1.5 tablets (75 mg total) by mouth 3 (three) times daily. 135 tablet 3   lenvatinib 20 mg daily dose (LENVIMA) 2 x 10 MG capsule Take 2 capsules (20 mg total) by mouth daily. 60 capsule 0   lidocaine-prilocaine (EMLA) cream Apply 1 application topically as needed. 30 g 3   metFORMIN (GLUCOPHAGE-XR) 500 MG 24 hr tablet      metoprolol succinate (TOPROL-XL) 50 MG 24 hr tablet Take 1 tablet (50 mg total) by mouth daily. Take with or immediately following a meal. 90 tablet 3   olmesartan (BENICAR) 40 MG tablet Take 1 tablet (40 mg total) by mouth daily. 90 tablet 3   ondansetron (ZOFRAN) 8 MG  tablet Take 1 tablet (8 mg total) by mouth every 8 (eight) hours as needed for nausea or vomiting. 30 tablet 1   pantoprazole (PROTONIX) 40 MG tablet TAKE 1 TABLET BY MOUTH TWICE A DAY 180 tablet 2   Pembrolizumab (KEYTRUDA IV) Inject into the vein.     potassium chloride SA (KLOR-CON M20) 20 MEQ tablet Take 1 tablet (20 mEq total) by mouth daily. 7 tablet 0   sucralfate (CARAFATE) 1 g tablet Take 1 tablet (1 g total) by mouth every 6 (six) hours as needed. Please schedule a yearly follow up for further refills: [phone] 60 tablet 0   tamsulosin (FLOMAX) 0.4 MG CAPS capsule Take 0.4 mg by mouth daily.     No current facility-administered medications for this visit.    PHYSICAL EXAMINATION: ECOG PERFORMANCE STATUS: 2 - Symptomatic, <50% confined to bed  Vitals:   12/29/22 1054  BP: 135/83  Pulse: 60  Resp: 17  Temp: 97.7 F (36.5 C)  SpO2: 100%   Wt Readings from Last 3 Encounters:  12/29/22 200 lb 4.8 oz (90.9 kg)  12/20/22 194 lb 3.2 oz (88.1 kg)  12/19/22 192 lb 14.4 oz (87.5 kg)    ABDOMEN:abdomen soft, non-tender and normal bowel sounds ,(+) abdominal fluid LABORATORY DATA:  I have reviewed the data as listed    Latest Ref Rng & Units 12/29/2022   10:34 AM 12/19/2022   10:41 AM 12/08/2022    8:25 AM  CBC  WBC 4.0 - 10.5 K/uL 6.1  6.8  4.1   Hemoglobin 13.0 - 17.0 g/dL 13.5  15.9  12.5   Hematocrit 39.0 - 52.0 % 38.6  47.1  37.5   Platelets 150 - 400 K/uL 123  116  101         Latest Ref Rng & Units 12/29/2022   10:34 AM 12/19/2022   10:41 AM 12/08/2022    8:25 AM  CMP  Glucose 70 - 99 mg/dL 122  82  86   BUN 8 - 23 mg/dL 31  35  17   Creatinine 0.61 - 1.24 mg/dL 1.69  1.86  1.44   Sodium 135 - 145 mmol/L 130  137  137   Potassium 3.5 - 5.1 mmol/L 3.8  3.7  3.7   Chloride 98 - 111 mmol/L 97  103  106   CO2 22 - 32 mmol/L 27  24  23   $ Calcium 8.9 - 10.3 mg/dL 8.9  9.0  8.8   Total Protein 6.5 - 8.1 g/dL 6.5  7.0  6.3   Total Bilirubin 0.3 - 1.2 mg/dL 1.5  2.0   1.1   Alkaline Phos 38 - 126 U/L 130  80  70   AST 15 - 41 U/L 94  88  47   ALT 0 - 44 U/L 58  41  25       RADIOGRAPHIC STUDIES: I have personally reviewed the radiological images as listed and agreed with the findings in the report. No results found.    No orders of the defined types were placed in this encounter.  All questions were answered. The patient knows to call the clinic with any problems, questions or concerns. No barriers to learning was detected. The total time spent in the appointment was 30 minutes.     [name], MD 12/29/2022   Felicity Coyer, CMA, am acting as scribe for [name], MD.   I have reviewed the above documentation for accuracy and completeness, and I agree with the above.

## 2022-12-30 ENCOUNTER — Other Ambulatory Visit: Payer: Self-pay

## 2023-01-02 ENCOUNTER — Telehealth: Payer: Self-pay

## 2023-01-02 ENCOUNTER — Other Ambulatory Visit: Payer: Self-pay

## 2023-01-02 ENCOUNTER — Other Ambulatory Visit: Payer: Self-pay | Admitting: Hematology

## 2023-01-02 DIAGNOSIS — C221 Intrahepatic bile duct carcinoma: Secondary | ICD-10-CM

## 2023-01-02 NOTE — Telephone Encounter (Signed)
Mrs. Novacek called on her husband behalf wanted to know if he needed to have an ultrasound with the US Paracentesis. Per Discussion with NP Cira Rue a verbal order was given. Pt wanted to have it done on Friday.

## 2023-01-02 NOTE — Telephone Encounter (Signed)
I called the Pt  Brandon Sherman wife and let her  know they can go ahead and call Central Scheduling and set up the appt for an US Paracentesis, I also gave her the number to call.

## 2023-01-05 ENCOUNTER — Ambulatory Visit (HOSPITAL_COMMUNITY)
Admission: RE | Admit: 2023-01-05 | Discharge: 2023-01-05 | Disposition: A | Discharge status: Home / Self Care | Payer: Medicare HMO | Source: Ambulatory Visit | Attending: Hematology | Admitting: Hematology

## 2023-01-05 DIAGNOSIS — R188 Other ascites: Secondary | ICD-10-CM | POA: Diagnosis not present

## 2023-01-05 DIAGNOSIS — C221 Intrahepatic bile duct carcinoma: Secondary | ICD-10-CM | POA: Insufficient documentation

## 2023-01-05 HISTORY — PX: IR PARACENTESIS: IMG2679

## 2023-01-05 MED ORDER — LIDOCAINE HCL 1 % IJ SOLN
INTRAMUSCULAR | Status: AC
  Administered 2023-01-05: 10 mL
  Filled 2023-01-05: qty 20

## 2023-01-05 NOTE — Procedures (Signed)
PROCEDURE SUMMARY:  Successful US guided paracentesis from right abdomen.  Yielded 5 L of clear yellow fluid.  No immediate complications.  Pt tolerated well.   Specimen not sent for labs.  EBL < 2 mL  Theresa Duty, NP 01/05/2023 11:33 AM

## 2023-01-15 ENCOUNTER — Other Ambulatory Visit (HOSPITAL_COMMUNITY): Payer: Self-pay

## 2023-01-16 ENCOUNTER — Other Ambulatory Visit (HOSPITAL_COMMUNITY): Payer: Self-pay

## 2023-01-17 ENCOUNTER — Other Ambulatory Visit (HOSPITAL_COMMUNITY): Payer: Self-pay

## 2023-01-18 ENCOUNTER — Ambulatory Visit (HOSPITAL_COMMUNITY)
Admission: RE | Admit: 2023-01-18 | Discharge: 2023-01-18 | Disposition: A | Discharge status: Home / Self Care | Payer: Medicare HMO | Source: Ambulatory Visit | Attending: Hematology | Admitting: Hematology

## 2023-01-18 DIAGNOSIS — C221 Intrahepatic bile duct carcinoma: Secondary | ICD-10-CM | POA: Diagnosis not present

## 2023-01-18 DIAGNOSIS — R188 Other ascites: Secondary | ICD-10-CM | POA: Insufficient documentation

## 2023-01-18 HISTORY — PX: IR PARACENTESIS: IMG2679

## 2023-01-18 MED ORDER — LIDOCAINE HCL 1 % IJ SOLN
INTRAMUSCULAR | Status: AC
  Administered 2023-01-18: 10 mL
  Filled 2023-01-18: qty 20

## 2023-01-18 NOTE — Procedures (Signed)
PROCEDURE SUMMARY:  Successful US guided therapeutic paracentesis from RLQ.  Yielded 5 L of clear, yellow fluid.  No immediate complications.  Pt tolerated well.   Specimen not sent for labs.  EBL < 1 mL  Tyson Alias, AGNP 01/18/2023 11:28 AM

## 2023-01-21 NOTE — Assessment & Plan Note (Signed)
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 -s/p first line Cisplatin and Gemcitabine, 09/29/19 - 07/19/20. Cisplatin discontinued 03/23/20 due to Afib. -he progressed through single agent gemcitabine (12/24/20 - 03/16/21), FOLFOX plus durvalumab (04/05/21 - 10/21/21), FOLFIRI (11/01/21 - 01/19/22), and then tried ivosidenib (02/02/22 - 04/2022) -he was switched to [place] on 05/15/22. He tolerated low-dose 80 mg daily well, but developed multiple side effects on 120 mg daily. He is tolerating '80mg'$  for 3 weeks on, 1 week off with no noticeable side effects. -due to disease progression, treatment changed to Lenvatinib and Keytruda recently  -treatment has been held since last week due to very poor performance status, anorexia, and new abdominal ascites -His nausea and low appetite have resolved now, he has recovered well from recent treatment. -There is no other standard treatment available.  Due to his limited performance status, I recommend palliative care and observation for now

## 2023-01-22 ENCOUNTER — Inpatient Hospital Stay (HOSPITAL_BASED_OUTPATIENT_CLINIC_OR_DEPARTMENT_OTHER): Payer: Medicare HMO | Admitting: Hematology

## 2023-01-22 ENCOUNTER — Encounter: Payer: Self-pay | Admitting: Hematology

## 2023-01-22 ENCOUNTER — Inpatient Hospital Stay: Payer: Medicare HMO | Attending: Hematology

## 2023-01-22 ENCOUNTER — Other Ambulatory Visit: Payer: Self-pay

## 2023-01-22 VITALS — BP 148/82 | HR 83 | Temp 98.5°F | Resp 18 | Ht 69.0 in | Wt 219.6 lb

## 2023-01-22 DIAGNOSIS — Z95828 Presence of other vascular implants and grafts: Secondary | ICD-10-CM

## 2023-01-22 DIAGNOSIS — R188 Other ascites: Secondary | ICD-10-CM | POA: Diagnosis not present

## 2023-01-22 DIAGNOSIS — C221 Intrahepatic bile duct carcinoma: Secondary | ICD-10-CM

## 2023-01-22 DIAGNOSIS — Z79899 Other long term (current) drug therapy: Secondary | ICD-10-CM | POA: Diagnosis not present

## 2023-01-22 DIAGNOSIS — E860 Dehydration: Secondary | ICD-10-CM

## 2023-01-22 LAB — CBC WITH DIFFERENTIAL/PLATELET
Abs Immature Granulocytes: 0.03 10*3/uL (ref 0.00–0.07)
Basophils Absolute: 0.1 10*3/uL (ref 0.0–0.1)
Basophils Relative: 1 %
Eosinophils Absolute: 0 10*3/uL (ref 0.0–0.5)
Eosinophils Relative: 0 %
HCT: 35.9 % — ABNORMAL LOW (ref 39.0–52.0)
Hemoglobin: 12.2 g/dL — ABNORMAL LOW (ref 13.0–17.0)
Immature Granulocytes: 0 %
Lymphocytes Relative: 13 %
Lymphs Abs: 0.9 10*3/uL (ref 0.7–4.0)
MCH: 30.3 pg (ref 26.0–34.0)
MCHC: 34 g/dL (ref 30.0–36.0)
MCV: 89.1 fL (ref 80.0–100.0)
Monocytes Absolute: 0.7 10*3/uL (ref 0.1–1.0)
Monocytes Relative: 10 %
Neutro Abs: 5.1 10*3/uL (ref 1.7–7.7)
Neutrophils Relative %: 76 %
Platelets: 162 10*3/uL (ref 150–400)
RBC: 4.03 MIL/uL — ABNORMAL LOW (ref 4.22–5.81)
RDW: 18.3 % — ABNORMAL HIGH (ref 11.5–15.5)
WBC: 6.8 10*3/uL (ref 4.0–10.5)
nRBC: 0 % (ref 0.0–0.2)

## 2023-01-22 LAB — COMPREHENSIVE METABOLIC PANEL
ALT: 27 U/L (ref 0–44)
AST: 48 U/L — ABNORMAL HIGH (ref 15–41)
Albumin: 3.2 g/dL — ABNORMAL LOW (ref 3.5–5.0)
Alkaline Phosphatase: 104 U/L (ref 38–126)
Anion gap: 7 (ref 5–15)
BUN: 27 mg/dL — ABNORMAL HIGH (ref 8–23)
CO2: 25 mmol/L (ref 22–32)
Calcium: 8.7 mg/dL — ABNORMAL LOW (ref 8.9–10.3)
Chloride: 103 mmol/L (ref 98–111)
Creatinine, Ser: 1.74 mg/dL — ABNORMAL HIGH (ref 0.61–1.24)
GFR, Estimated: 41 mL/min — ABNORMAL LOW (ref >60)
Glucose, Bld: 120 mg/dL — ABNORMAL HIGH (ref 70–99)
Potassium: 3.7 mmol/L (ref 3.5–5.1)
Sodium: 135 mmol/L (ref 135–145)
Total Bilirubin: 0.9 mg/dL (ref 0.3–1.2)
Total Protein: 6.6 g/dL (ref 6.5–8.1)

## 2023-01-22 MED ORDER — SODIUM CHLORIDE 0.9% FLUSH
10.0000 mL | Freq: Once | INTRAVENOUS | Status: AC
  Administered 2023-01-22: 10 mL

## 2023-01-22 MED ORDER — HEPARIN SOD (PORK) LOCK FLUSH 100 UNIT/ML IV SOLN
500.0000 [IU] | Freq: Once | INTRAVENOUS | Status: AC
  Administered 2023-01-22: 500 [IU]

## 2023-01-22 NOTE — Progress Notes (Signed)
[name]   [phone] [phone]   Clinic Follow up Note   Patient Care Team: Marga Hoots, NP as PCP - General (Adult Health Nurse Practitioner) Lorretta Harp, MD as PCP - Cardiology (Cardiology) [name], MD as Consulting Physician (General Surgery) [name], [name], MD as Consulting Physician (Gastroenterology) [name], MD as Consulting Physician (Hematology) Lorretta Harp, MD as Consulting Physician (Cardiology)  Date of Service:  01/22/2023  CHIEF COMPLAINT: f/u of cholangiocarcinoma    CURRENT THERAPY:  Supportive care   ASSESSMENT:  Brandon Sherman is a 77 y.o. 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 -s/p first line Cisplatin and Gemcitabine, 09/29/19 - 07/19/20. Cisplatin discontinued 03/23/20 due to Afib. -he progressed through single agent gemcitabine (12/24/20 - 03/16/21), FOLFOX plus durvalumab (04/05/21 - 10/21/21), FOLFIRI (11/01/21 - 01/19/22), and then tried ivosidenib (02/02/22 - 04/2022) -he was switched to [place] on 05/15/22. He tolerated low-dose 80 mg daily well, but developed multiple side effects on 120 mg daily. He is tolerating '80mg'$  for 3 weeks on, 1 week off with no noticeable side effects. -due to disease progression, treatment changed to Lenvatinib and Keytruda recently  -treatment has been held since last week due to very poor performance status, anorexia, and new abdominal ascites -His nausea and low appetite have resolved now, he has recovered well from recent treatment. -There is no other standard treatment available.  Due to his limited performance status, I recommend palliative care and observation for now -we reviewed home palliative care and hospice again today, he is not ready and denied it -he has more ascites built up  quickly, I removed the volume limitation (5L) in his paracentesis standing order, and my nurse called to schedule him for this Thursday     PLAN: -Pt decline Hospice/Palliative care to come in the home. -next paracentesis on 3/7 by IR, OK to remove as much as they can and will give albumin infusion if volume>5L  -lab/f/u in 1 month   SUMMARY OF ONCOLOGIC HISTORY: Oncology History Overview Note  Cancer Staging Intrahepatic cholangiocarcinoma ([place]) Staging form: Intrahepatic Bile Duct, AJCC 8th Edition - Clinical stage from 08/19/2019: Stage II (cT2, cN0, cM0) - Signed by [name], MD on 08/19/2019    Intrahepatic cholangiocarcinoma ([place])  06/28/2019 Imaging   CT AP W Contrast 06/28/19  IMPRESSION: 1. Heterogeneous hypodensity posteriorly in the right hepatic lobe and potentially extending into the caudate lobe suspicious for a mass. There is felt to be truncation of branches of the portal vein in this vicinity and some narrowing of the hepatic vein, as well as triangular-shaped regions of abnormal hypoenhancement posteriorly in the right hepatic lobe likely representing downstream vascular effects. Cannot exclude malignancy such as cholangiocarcinoma or hepatocellular carcinoma, and follow up hepatic protocol MRI with and without contrast is recommended to further characterize. 2. 4 mm right middle lobe pulmonary nodule is likely benign but may merit surveillance. 3. Cholelithiasis. 4.  Aortic Atherosclerosis (ICD10-I70.0). 5. Prostatomegaly. 6. Mild impingement at L3-4 and L4-5.   07/31/2019 Imaging   MRI Liver 07/31/19 IMPRESSION: 1. 7.3 cm in long axis mass in the right hepatic lobe spanning into the caudate lobe, high suspicion for malignancy such as hepatocellular carcinoma or cholangiocarcinoma. Suspected effacement or occlusion of the right hepatic vein and posterior branches of the right portal vein. Two smaller tumor  nodules along the posterior periphery of the dominant  mass. Tissue diagnosis is recommended. 2. No findings of pathologic adenopathy or distant metastatic spread. 3. 9 mm gallstone in the gallbladder. There is mild gallbladder wall thickening which may be from nondistention, correlate clinically in assessing for cholecystitis. 4.  Aortic Atherosclerosis (ICD10-I70.0). 5. Mild diffuse hepatic steatosis.   08/11/2019 Initial Biopsy   DIAGNOSIS: 08/11/19  A. LIVER, RIGHT, BIOPSY:  - Adenocarcinoma.   08/18/2019 Imaging   CT Chest 08/18/19  IMPRESSION: 1. Multiple pulmonary nodules largest at approximately 7 mm in the right lower lobe, nonspecific but concerning given findings in the liver. 2. No signs of definitive metastatic disease, also with mildly enlarged upper abdominal lymph nodes as discussed.   Aortic Atherosclerosis (ICD10-I70.0).   08/19/2019 Initial Diagnosis   Intrahepatic cholangiocarcinoma ([place])   08/19/2019 Cancer Staging   Staging form: Intrahepatic Bile Duct, AJCC 8th Edition - Clinical stage from 08/19/2019: Stage II (cT2, cN0, cM0) - Signed by [name], MD on 08/19/2019   09/29/2019 - 07/19/2020 Chemotherapy   Cisplatin and Gemcitabine 2 weeks on/1 week off starting 09/29/19. Cisplatin held from cycle 9 (03/23/20) due to fluid status/Afib. He is now on maintenance Gemcitabine. Held after 07/19/20 to proceed with liver target therapy.     10/09/2019 Imaging   CT AP IMPRESSION: 1. The dominant right hepatic lobe mass is minimally reduced in size compared to prior exams, currently measuring 6.2 by 5.3 cm, previously 6.2 by 5.6 cm. However, there is a new small hypodense lesion centrally in the right hepatic lobe which is suspicious for a new small focus of tumor. Accordingly this is an overall mixed appearance. 2. Continued hypoenhancement in the liver downstream of the tumor likely attributable to narrowing or occlusion of the right hepatic vein by the tumor. By virtue of its location the tumor wraps around the intrahepatic  portion of the IVC. 3. 4 mm right middle lobe pulmonary nodule, stable compared to earliest available comparison of 07/18/2019. Surveillance of the patient's pulmonary nodules is recommended. 4. Other imaging findings of potential clinical significance: Coronary atherosclerosis. Cholelithiasis. Prominent stool throughout the colon favors constipation. Moderate prostatomegaly with heterogeneous enhancement of the prostate gland. Lumbar spondylosis and degenerative disc disease causing mild bilateral foraminal impingement at L3-4 and L4-5.   Aortic Atherosclerosis (ICD10-I70.0).   12/24/2019 Imaging   CT CAP W Contrast  IMPRESSION: 1. Right hepatic lobe mass and adjacent right hepatic lobe nodules appear grossly stable. No evidence of distant metastatic disease. 2. Continued stability of small pulmonary nodules. Recommend attention on follow-up. 3. Cholelithiasis. 4. Enlarged prostate. 5. Aortic atherosclerosis (ICD10-I70.0). Coronary artery calcification.   02/23/2020 Procedure   He had PAC placed on 02/23/20.    04/08/2020 Imaging   CT CAP  IMPRESSION: 1. Stable or minimally decreased size of a very ill-defined, hypodense and somewhat retractile appearing mass of the central right lobe of the liver abutting the inferior vena cava and right portal vein, measuring approximately 5.7 x 5.0 cm, previously 6.2 x 5.2 cm when measured similarly. Findings are consistent with stable or minimally improved cholangiocarcinoma. 2. Small hypodense nodules of the right lobe identified on prior examination are poorly appreciated on this single phase contrast examination although not grossly changed. Attention on follow-up. 3. There are new, moderate bilateral pleural effusions and associated atelectasis or consolidation as well as a new small pericardial effusion, nonspecific although generally concerning and suspicious for malignant effusions. There is no directly visualized pleural nodularity. 4.  Multiple small pulmonary  nodules are stable.  No new nodules. 5. Coronary artery disease. Aortic Atherosclerosis (ICD10-I70.0).   07/06/2020 Imaging   MRI ABD IMPRESSION: 1. Interval decrease in size of the right hepatic lobe lesion in the medial aspect of the segment 7. Findings would suggest a good response to treatment with some contraction of the tumor. 2. No new hepatic lesions. No abdominal adenopathy or metastatic disease. 3. Stable mild intrahepatic biliary dilatation in the right hepatic lobe distal to the lesion. 4. Somewhat tortuous and almost beaded appearance of the hepatic and portal vein radicles. Findings could be radiation related. 5. Cholelithiasis.  CT chest wo contrast IMPRESSION: 1. Persistent/stable moderate-sized bilateral pleural effusions with overlying atelectasis. 2. Stable small right pulmonary nodules. No new or progressive findings. Recommend continued surveillance. 3. No mediastinal or hilar mass or adenopathy. 4. Stable advanced three-vessel coronary artery calcifications. 5. Cholelithiasis. Aortic Atherosclerosis (ICD10-I70.0)   08/11/2020 Procedure   Y90 on 08/11/20 and 08/26/20 with Dr Laurence Ferrari    11/30/2020 Imaging   MRI Abdomen  IMPRESSION: 1. Today's study demonstrates progression of disease with enlarging central lesion in the right lobe of the liver involving portions of segments 7 and 8, now with evidence of some tumor thrombus extension into the intrahepatic portion of the inferior vena cava. This is associated with increasing intrahepatic biliary ductal dilatation in segment 7 of the liver. 2. In addition, there is some subtle hyperenhancement of the distal common bile duct at and immediately proximally to the level of the ampulla. There is also some subtle delayed enhancement around this region in the pancreatic head. This is of uncertain etiology and significance, but may be inflammatory and currently is not associated with proximal  common bile duct dilatation. However, close attention on follow-up imaging is recommended. 3. Cholelithiasis without evidence of acute cholecystitis at this time. 4. Aortic atherosclerosis.     12/24/2020 - 03/21/2021 Chemotherapy   Restart Gemcitabine 2 weeks on/1 week off given disease progression beginning 12/24/20. Discontinued after 03/21/21 due to disease progression in liver.     03/16/2021 Imaging   MRI abdomen  IMPRESSION: No significant change in size of irregular hypovascular mass in the medial right hepatic lobe.   New 1.1 cm hypovascular lesion with peripheral rim enhancement in liver dome, suspicious for metastatic disease.   New small right pleural effusion and mild ascites.   Cholelithiasis, without evidence of cholecystitis or biliary dilatation.   04/05/2021 -  Chemotherapy   Second-line FOLFOX q2weeks starting 04/05/21    04/05/2021 -  Chemotherapy   Immunotherapy with Durvalumab ([facility]fin[facility]) q4weeks starting 04/05/21. (in addition to second line FOLFOX)   06/23/2021 Imaging   MRI Abdomen IMPRESSION: 1. No significant change in subcapsular mass of the posterior right lobe of the liver. 2. No significant change in an additional small hyperenhancing lesion of the liver dome, which remains suspicious for a satellite lesion. 3. No new liver lesions. 4. 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 5. Cholelithiasis without evidence of acute cholecystitis. 6. Trace ascites.  CT Chest w/o contrast IMPRESSION: 1. Occasional small pulmonary nodules in the right lung are stable, for example a 6 mm nodule right lower lobe and a 3 mm nodule of the lateral segment right middle lobe. These are nonspecific and likely benign, incidental sequelae of infection or inflammation, metastatic disease not favored. Attention on follow-up. 2. Previously seen bilateral pleural effusions are resolved. 3. Hypodense lesion of the posterior right lobe of the liver, keeping with known  cholangiocarcinoma and better characterized by  same day MR. 4. Coronary artery disease.   10/21/2021 Imaging   EXAM: MRI ABDOMEN WITHOUT AND WITH CONTRAST  IMPRESSION: 1. Mild progression of dominant right hepatic lobe mass with involvement of the right portal, hepatic veins and minimal extension into the IVC. 2. Hepatic dome satellite lesion, new or more conspicuous today. 3. Cholelithiasis 4. Decrease in trace perihepatic and perisplenic ascites. 5.  Aortic Atherosclerosis (ICD10-I70.0).   10/21/2021 Imaging   EXAM: CT CHEST WITHOUT CONTRAST  IMPRESSION: 1. Since 06/23/2021, enlargement of a right lower lobe and possible enlargement of a right middle lobe pulmonary nodule. Findings are suspicious for metastatic disease. 2. No thoracic adenopathy. 3. Coronary artery atherosclerosis. Aortic Atherosclerosis (ICD10-I70.0).   11/01/2021 - 01/11/2022 Chemotherapy   Patient is on Treatment Plan : PANCREAS FOLFIRI q14d     01/19/2022 Imaging   EXAM: CT CHEST WITHOUT CONTRAST  IMPRESSION: 1. Continued increase in size of lung nodule within the posterior right lung base which now measures 1 cm and is suspicious for metastatic disease. The remaining lung nodules are stable from 10/21/2021. 2. No thoracic adenopathy. 3. Aortic Atherosclerosis (ICD10-I70.0). Coronary artery calcifications.   01/19/2022 Imaging   EXAM: MRI ABDOMEN WITHOUT AND WITH CONTRAST  IMPRESSION: 1. No significant change in size or appearance of the large heterogeneous enhancing mass area in the right hepatic lobe as described. There is mild interval enlargement of the adjacent satellite lesion near the dome of the right lobe now measuring 1.9 cm. 2. Redemonstration of a pulmonary nodule in the right lower lobe measuring 9 mm. 3. Small volume ascites. 4. Splenomegaly. 5. Cholelithiasis.   12/08/2022 - 12/08/2022 Chemotherapy   Patient is on Treatment Plan : UTERINE Lenvatinib (20) D1-21 + Pembrolizumab  (200) D1 q21d        INTERVAL HISTORY:  Brandon Sherman is here for a follow up of cholangiocarcinoma   He was last seen by me on 12/29/2022 He presents to the clinic accompanied by wife. Pt states that he had a paracentesis and had 5 liters. Pt state his appetite is good.Pt reports he feels good.  All other systems were reviewed with the patient and are negative.  MEDICAL HISTORY:  Past Medical History:  Diagnosis Date   Arthritis    Diabetes ([place])    GERD (gastroesophageal reflux disease)    Hyperlipidemia    Hypertension    Intrahepatic cholangiocarcinoma ([place])     SURGICAL HISTORY: Past Surgical History:  Procedure Laterality Date   APPENDECTOMY  1980   CARDIOVERSION N/A 04/27/2020   Procedure: CARDIOVERSION;  Surgeon: Dorothy Spark, MD;  Location: [facility];  Service: Cardiovascular;  Laterality: N/A;   CARDIOVERSION N/A 05/19/2020   Procedure: CARDIOVERSION;  Surgeon: Elouise Munroe, MD;  Location: [facility];  Service: Cardiovascular;  Laterality: N/A;   COLONOSCOPY     IR 3D INDEPENDENT WKST  08/11/2020   IR 3D INDEPENDENT WKST  08/11/2020   IR ANGIOGRAM SELECTIVE EACH ADDITIONAL VESSEL  08/11/2020   IR ANGIOGRAM SELECTIVE EACH ADDITIONAL VESSEL  08/11/2020   IR ANGIOGRAM SELECTIVE EACH ADDITIONAL VESSEL  08/11/2020   IR ANGIOGRAM SELECTIVE EACH ADDITIONAL VESSEL  08/11/2020   IR ANGIOGRAM SELECTIVE EACH ADDITIONAL VESSEL  08/11/2020   IR ANGIOGRAM SELECTIVE EACH ADDITIONAL VESSEL  08/11/2020   IR ANGIOGRAM SELECTIVE EACH ADDITIONAL VESSEL  08/26/2020   IR ANGIOGRAM SELECTIVE EACH ADDITIONAL VESSEL  08/26/2020   IR ANGIOGRAM VISCERAL SELECTIVE  08/11/2020   IR ANGIOGRAM VISCERAL SELECTIVE  08/26/2020   IR EMBO ARTERIAL  NOT HEMORR HEMANG INC GUIDE ROADMAPPING  08/11/2020   IR EMBO TUMOR ORGAN ISCHEMIA INFARCT INC GUIDE ROADMAPPING  08/26/2020   IR IMAGING GUIDED PORT INSERTION  02/23/2020   IR PARACENTESIS  01/05/2023   IR PARACENTESIS  01/18/2023   IR RADIOLOGIST EVAL  & MGMT  07/14/2020   IR RADIOLOGIST EVAL & MGMT  09/30/2020   IR RADIOLOGIST EVAL & MGMT  12/01/2020   IR US GUIDE VASC ACCESS LEFT  08/26/2020   IR US GUIDE VASC ACCESS RIGHT  08/11/2020    I have reviewed the social history and family history with the patient and they are unchanged from previous note.  ALLERGIES:  has No Known Allergies.  MEDICATIONS:  Current Outpatient Medications  Medication Sig Dispense Refill   allopurinol (ZYLOPRIM) 100 MG tablet Take 100 mg by mouth daily.     apixaban (ELIQUIS) 5 MG TABS tablet TAKE 1 TABLET TWICE DAILY 180 tablet 1   atorvastatin (LIPITOR) 40 MG tablet Take 1 tablet (40 mg total) by mouth daily. 90 tablet 3   fenofibrate (TRICOR) 145 MG tablet Take 145 mg by mouth daily.     finasteride (PROSCAR) 5 MG tablet Take 5 mg by mouth daily.     furosemide (LASIX) 40 MG tablet TAKE 2 TABLETS BY MOUTH 2 TIMES DAILY. (Patient taking differently: Take 40 mg by mouth 3 (three) times a week. TAKE ONE TABLET (40 mg) MONDAY, WEDNESDAY, AND FRIDAY.) 360 tablet 3   gabapentin (NEURONTIN) 100 MG capsule Take 100 mg by mouth at bedtime.     hydrALAZINE (APRESOLINE) 50 MG tablet Take 1.5 tablets (75 mg total) by mouth 3 (three) times daily. 135 tablet 3   lenvatinib 20 mg daily dose (LENVIMA) 2 x 10 MG capsule Take 2 capsules (20 mg total) by mouth daily. 60 capsule 0   lidocaine-prilocaine (EMLA) cream Apply 1 application topically as needed. 30 g 3   metFORMIN (GLUCOPHAGE-XR) 500 MG 24 hr tablet      metoprolol succinate (TOPROL-XL) 50 MG 24 hr tablet Take 1 tablet (50 mg total) by mouth daily. Take with or immediately following a meal. 90 tablet 3   olmesartan (BENICAR) 40 MG tablet Take 1 tablet (40 mg total) by mouth daily. 90 tablet 3   ondansetron (ZOFRAN) 8 MG tablet Take 1 tablet (8 mg total) by mouth every 8 (eight) hours as needed for nausea or vomiting. 30 tablet 1   pantoprazole (PROTONIX) 40 MG tablet TAKE 1 TABLET BY MOUTH TWICE A DAY 180 tablet 2    Pembrolizumab (KEYTRUDA IV) Inject into the vein.     potassium chloride SA (KLOR-CON M20) 20 MEQ tablet Take 1 tablet (20 mEq total) by mouth daily. 7 tablet 0   sucralfate (CARAFATE) 1 g tablet Take 1 tablet (1 g total) by mouth every 6 (six) hours as needed. Please schedule a yearly follow up for further refills: [phone] 60 tablet 0   tamsulosin (FLOMAX) 0.4 MG CAPS capsule Take 0.4 mg by mouth daily.     No current facility-administered medications for this visit.    PHYSICAL EXAMINATION: ECOG PERFORMANCE STATUS: 2 - Symptomatic, <50% confined to bed  Vitals:   01/22/23 1612  BP: (!) 148/82  Pulse: 83  Resp: 18  Temp: 98.5 F (36.9 C)  SpO2: 97%   Wt Readings from Last 3 Encounters:  01/22/23 219 lb 9.6 oz (99.6 kg)  12/29/22 200 lb 4.8 oz (90.9 kg)  12/20/22 194 lb 3.2 oz (88.1 kg)  GENERAL:alert, no distress and comfortable SKIN:(+)  skin color, texture, turgor are normal, rashes or significant lesions ABDOMEN:(+) abdomen soft, non-tender and normal bowel sounds   LABORATORY DATA:  I have reviewed the data as listed    Latest Ref Rng & Units 01/22/2023    3:13 PM 12/29/2022   10:34 AM 12/19/2022   10:41 AM  CBC  WBC 4.0 - 10.5 K/uL 6.8  6.1  6.8   Hemoglobin 13.0 - 17.0 g/dL 12.2  13.5  15.9   Hematocrit 39.0 - 52.0 % 35.9  38.6  47.1   Platelets 150 - 400 K/uL 162  123  116         Latest Ref Rng & Units 01/22/2023    3:13 PM 12/29/2022   10:34 AM 12/19/2022   10:41 AM  CMP  Glucose 70 - 99 mg/dL 120  122  82   BUN 8 - 23 mg/dL 27  31  35   Creatinine 0.61 - 1.24 mg/dL 1.74  1.69  1.86   Sodium 135 - 145 mmol/L 135  130  137   Potassium 3.5 - 5.1 mmol/L 3.7  3.8  3.7   Chloride 98 - 111 mmol/L 103  97  103   CO2 22 - 32 mmol/L '25  27  24   '$ Calcium 8.9 - 10.3 mg/dL 8.7  8.9  9.0   Total Protein 6.5 - 8.1 g/dL 6.6  6.5  7.0   Total Bilirubin 0.3 - 1.2 mg/dL 0.9  1.5  2.0   Alkaline Phos 38 - 126 U/L 104  130  80   AST 15 - 41 U/L 48  94  88   ALT 0 -  44 U/L 27  58  41       RADIOGRAPHIC STUDIES: I have personally reviewed the radiological images as listed and agreed with the findings in the report. No results found.    Orders Placed This Encounter  Procedures   US Paracentesis    Standing Status:   Standing    Number of Occurrences:   20    Standing Expiration Date:   04/24/2023    Order Specific Question:   If therapeutic, is there a maximum amount of fluid to be removed?    Answer:   Yes    Order Specific Question:   What is the maximum amount of fluide to be removed?    Answer:   no limit    Order Specific Question:   Are labs required for specimen collection?    Answer:   No    Order Specific Question:   Is Albumin medication needed?    Answer:   Yes    Order Specific Question:   A seperate Albumin medication order is required.    Answer:   I understand I need to place a separate order for albumin    Order Specific Question:   Reason for Exam (SYMPTOM  OR DIAGNOSIS REQUIRED)    Answer:   symptoms relieve    Order Specific Question:   Preferred imaging location?    Answer:   [facility]    Order Specific Question:   Release to patient    Answer:   Immediate   All questions were answered. The patient knows to call the clinic with any problems, questions or concerns. No barriers to learning was detected. The total time spent in the appointment was 30 minutes.     [name], MD 01/22/2023   Melodye Ped  McNairy, CMA, am acting as scribe for [name], MD.   I have reviewed the above documentation for accuracy and completeness, and I agree with the above.

## 2023-01-24 DIAGNOSIS — I129 Hypertensive chronic kidney disease with stage 1 through stage 4 chronic kidney disease, or unspecified chronic kidney disease: Secondary | ICD-10-CM | POA: Diagnosis not present

## 2023-01-24 DIAGNOSIS — N182 Chronic kidney disease, stage 2 (mild): Secondary | ICD-10-CM | POA: Diagnosis not present

## 2023-01-24 DIAGNOSIS — I509 Heart failure, unspecified: Secondary | ICD-10-CM | POA: Diagnosis not present

## 2023-01-24 DIAGNOSIS — E785 Hyperlipidemia, unspecified: Secondary | ICD-10-CM | POA: Diagnosis not present

## 2023-01-24 DIAGNOSIS — L0390 Cellulitis, unspecified: Secondary | ICD-10-CM | POA: Diagnosis not present

## 2023-01-24 DIAGNOSIS — Z6833 Body mass index (BMI) 33.0-33.9, adult: Secondary | ICD-10-CM | POA: Diagnosis not present

## 2023-01-24 DIAGNOSIS — G629 Polyneuropathy, unspecified: Secondary | ICD-10-CM | POA: Diagnosis not present

## 2023-01-24 DIAGNOSIS — C221 Intrahepatic bile duct carcinoma: Secondary | ICD-10-CM | POA: Diagnosis not present

## 2023-01-24 DIAGNOSIS — E1122 Type 2 diabetes mellitus with diabetic chronic kidney disease: Secondary | ICD-10-CM | POA: Diagnosis not present

## 2023-01-25 ENCOUNTER — Ambulatory Visit (HOSPITAL_COMMUNITY)
Admission: RE | Admit: 2023-01-25 | Discharge: 2023-01-25 | Disposition: A | Discharge status: Home / Self Care | Payer: Medicare HMO | Source: Ambulatory Visit | Attending: Hematology | Admitting: Hematology

## 2023-01-25 DIAGNOSIS — R188 Other ascites: Secondary | ICD-10-CM | POA: Insufficient documentation

## 2023-01-25 DIAGNOSIS — C221 Intrahepatic bile duct carcinoma: Secondary | ICD-10-CM | POA: Insufficient documentation

## 2023-01-25 HISTORY — PX: IR PARACENTESIS: IMG2679

## 2023-01-25 MED ORDER — LIDOCAINE HCL 1 % IJ SOLN
INTRAMUSCULAR | Status: AC
  Filled 2023-01-25: qty 20

## 2023-01-25 MED ORDER — ALBUMIN HUMAN 25 % IV SOLN: 50.0000 g | Freq: Once | INTRAVENOUS | Status: AC

## 2023-01-25 MED ORDER — ALBUMIN HUMAN 25 % IV SOLN
INTRAVENOUS | Status: AC
  Administered 2023-01-25: 50 g via INTRAVENOUS
  Filled 2023-01-25: qty 200

## 2023-01-25 MED ORDER — HEPARIN SOD (PORK) LOCK FLUSH 100 UNIT/ML IV SOLN
INTRAVENOUS | Status: AC
  Filled 2023-01-25: qty 5

## 2023-01-25 NOTE — Procedures (Signed)
PROCEDURE SUMMARY:  Successful US guided paracentesis from right lateral abdomen.   Yielded 8.0 liters of yellow fluid.  No immediate complications.  Pt tolerated well.   Specimen was not sent for labs.  EBL < 69m  KDocia BarrierPA-C 01/25/2023 10:41 AM

## 2023-01-26 ENCOUNTER — Ambulatory Visit: Payer: Medicare HMO | Admitting: Hematology

## 2023-01-26 ENCOUNTER — Other Ambulatory Visit: Payer: Medicare HMO

## 2023-01-26 ENCOUNTER — Other Ambulatory Visit: Payer: Self-pay | Admitting: Cardiovascular Disease

## 2023-01-29 ENCOUNTER — Encounter (HOSPITAL_BASED_OUTPATIENT_CLINIC_OR_DEPARTMENT_OTHER): Payer: Self-pay | Admitting: Emergency Medicine

## 2023-01-29 ENCOUNTER — Emergency Department (HOSPITAL_BASED_OUTPATIENT_CLINIC_OR_DEPARTMENT_OTHER)
Admission: EM | Admit: 2023-01-29 | Discharge: 2023-01-29 | Disposition: A | Discharge status: Home / Self Care | Payer: Medicare HMO | Attending: Emergency Medicine | Admitting: Emergency Medicine

## 2023-01-29 ENCOUNTER — Emergency Department (HOSPITAL_BASED_OUTPATIENT_CLINIC_OR_DEPARTMENT_OTHER): Payer: Medicare HMO

## 2023-01-29 DIAGNOSIS — L03116 Cellulitis of left lower limb: Secondary | ICD-10-CM | POA: Diagnosis not present

## 2023-01-29 DIAGNOSIS — Z8505 Personal history of malignant neoplasm of liver: Secondary | ICD-10-CM | POA: Insufficient documentation

## 2023-01-29 DIAGNOSIS — L03119 Cellulitis of unspecified part of limb: Secondary | ICD-10-CM | POA: Diagnosis not present

## 2023-01-29 DIAGNOSIS — L539 Erythematous condition, unspecified: Secondary | ICD-10-CM | POA: Diagnosis not present

## 2023-01-29 DIAGNOSIS — E119 Type 2 diabetes mellitus without complications: Secondary | ICD-10-CM | POA: Diagnosis not present

## 2023-01-29 DIAGNOSIS — R21 Rash and other nonspecific skin eruption: Secondary | ICD-10-CM | POA: Insufficient documentation

## 2023-01-29 DIAGNOSIS — M7989 Other specified soft tissue disorders: Secondary | ICD-10-CM | POA: Diagnosis not present

## 2023-01-29 DIAGNOSIS — L03115 Cellulitis of right lower limb: Secondary | ICD-10-CM | POA: Diagnosis not present

## 2023-01-29 DIAGNOSIS — Z7984 Long term (current) use of oral hypoglycemic drugs: Secondary | ICD-10-CM | POA: Diagnosis not present

## 2023-01-29 LAB — COMPREHENSIVE METABOLIC PANEL
ALT: 40 U/L (ref 0–44)
AST: 85 U/L — ABNORMAL HIGH (ref 15–41)
Albumin: 3.1 g/dL — ABNORMAL LOW (ref 3.5–5.0)
Alkaline Phosphatase: 105 U/L (ref 38–126)
Anion gap: 6 (ref 5–15)
BUN: 37 mg/dL — ABNORMAL HIGH (ref 8–23)
CO2: 24 mmol/L (ref 22–32)
Calcium: 8.1 mg/dL — ABNORMAL LOW (ref 8.9–10.3)
Chloride: 95 mmol/L — ABNORMAL LOW (ref 98–111)
Creatinine, Ser: 1.86 mg/dL — ABNORMAL HIGH (ref 0.61–1.24)
GFR, Estimated: 38 mL/min — ABNORMAL LOW (ref >60)
Glucose, Bld: 114 mg/dL — ABNORMAL HIGH (ref 70–99)
Potassium: 4 mmol/L (ref 3.5–5.1)
Sodium: 125 mmol/L — ABNORMAL LOW (ref 135–145)
Total Bilirubin: 1 mg/dL (ref 0.3–1.2)
Total Protein: 7 g/dL (ref 6.5–8.1)

## 2023-01-29 LAB — LACTIC ACID, PLASMA
Lactic Acid, Venous: 0.9 mmol/L (ref 0.5–1.9)
Lactic Acid, Venous: 0.9 mmol/L (ref 0.5–1.9)

## 2023-01-29 LAB — CBC WITH DIFFERENTIAL/PLATELET
Abs Immature Granulocytes: 0.02 10*3/uL (ref 0.00–0.07)
Basophils Absolute: 0 10*3/uL (ref 0.0–0.1)
Basophils Relative: 0 %
Eosinophils Absolute: 0 10*3/uL (ref 0.0–0.5)
Eosinophils Relative: 0 %
HCT: 37.7 % — ABNORMAL LOW (ref 39.0–52.0)
Hemoglobin: 12.4 g/dL — ABNORMAL LOW (ref 13.0–17.0)
Immature Granulocytes: 0 %
Lymphocytes Relative: 13 %
Lymphs Abs: 0.9 10*3/uL (ref 0.7–4.0)
MCH: 29.8 pg (ref 26.0–34.0)
MCHC: 32.9 g/dL (ref 30.0–36.0)
MCV: 90.6 fL (ref 80.0–100.0)
Monocytes Absolute: 0.7 10*3/uL (ref 0.1–1.0)
Monocytes Relative: 10 %
Neutro Abs: 5.3 10*3/uL (ref 1.7–7.7)
Neutrophils Relative %: 77 %
Platelets: 145 10*3/uL — ABNORMAL LOW (ref 150–400)
RBC: 4.16 MIL/uL — ABNORMAL LOW (ref 4.22–5.81)
RDW: 18.6 % — ABNORMAL HIGH (ref 11.5–15.5)
WBC: 7 10*3/uL (ref 4.0–10.5)
nRBC: 0 % (ref 0.0–0.2)

## 2023-01-29 MED ORDER — CLINDAMYCIN HCL 300 MG PO CAPS: 300.0000 mg | ORAL_CAPSULE | Freq: Three times a day (TID) | ORAL | 0 refills | Status: DC

## 2023-01-29 MED ORDER — SODIUM CHLORIDE 0.9 % IV BOLUS
1000.0000 mL | Freq: Once | INTRAVENOUS | Status: AC
  Administered 2023-01-29: 1000 mL via INTRAVENOUS

## 2023-01-29 MED ORDER — SODIUM CHLORIDE 0.9 % IV SOLN
1.0000 g | Freq: Once | INTRAVENOUS | Status: AC
  Administered 2023-01-29: 1 g via INTRAVENOUS

## 2023-01-29 MED ORDER — VANCOMYCIN HCL IN DEXTROSE 1-5 GM/200ML-% IV SOLN
1000.0000 mg | Freq: Once | INTRAVENOUS | Status: AC
  Administered 2023-01-29: 1000 mg via INTRAVENOUS
  Filled 2023-01-29: qty 200

## 2023-01-29 MED ORDER — SODIUM CHLORIDE 0.9 % IV SOLN: 1.0000 g | Freq: Once | INTRAVENOUS | Status: DC

## 2023-01-29 MED ORDER — DOXYCYCLINE HYCLATE 100 MG PO CAPS: 100.0000 mg | ORAL_CAPSULE | Freq: Two times a day (BID) | ORAL | 0 refills | Status: DC

## 2023-01-29 MED ORDER — HEPARIN SOD (PORK) LOCK FLUSH 100 UNIT/ML IV SOLN
INTRAVENOUS | Status: AC
  Filled 2023-01-29: qty 5

## 2023-01-29 NOTE — ED Provider Notes (Signed)
[name] Provider Note   CSN: [id number] Arrival date & time: 01/29/23  1215     History  Chief Complaint  Patient presents with   Wound Infection    Brandon Sherman is a 77 y.o. male history of end-stage liver cancer, diabetes, A-fib on Eliquis, here presenting with bilateral leg cellulitis.  Patient states that several weeks ago he excellently bumped into his leg and since then he noticed some redness and swelling.  He saw his PCP several days ago and was prescribed Keflex.  He has been on Keflex for 5 days.  He states that the redness has gotten worse.  Denies any fevers or purulent drainage.  Patient of note has end-stage liver cancer and is on palliative care for that.  He states that he would rather go home and not stay in the hospital.  PCP saw him today was sent in for IV antibiotics.  The history is provided by the patient.       Home Medications Prior to Admission medications   Medication Sig Start Date End Date Taking? Authorizing Provider  allopurinol (ZYLOPRIM) 100 MG tablet Take 100 mg by mouth daily.    [provider]  apixaban (ELIQUIS) 5 MG TABS tablet TAKE 1 TABLET TWICE DAILY 09/18/22   Lorretta Harp, MD  atorvastatin (LIPITOR) 40 MG tablet Take 1 tablet (40 mg total) by mouth daily. 06/20/22   Lorretta Harp, MD  fenofibrate (TRICOR) 145 MG tablet Take 145 mg by mouth daily.    [provider]  finasteride (PROSCAR) 5 MG tablet Take 5 mg by mouth daily.    [provider]  furosemide (LASIX) 40 MG tablet TAKE 2 TABLETS BY MOUTH 2 TIMES DAILY. Patient taking differently: Take 40 mg by mouth 3 (three) times a week. TAKE ONE TABLET (40 mg) MONDAY, WEDNESDAY, AND FRIDAY. 09/19/22   Lorretta Harp, MD  gabapentin (NEURONTIN) 100 MG capsule Take 100 mg by mouth at bedtime.    [provider]  hydrALAZINE (APRESOLINE) 50 MG tablet Take 1.5 tablets (75 mg total) by mouth 3  (three) times daily. 06/20/22   Lorretta Harp, MD  lenvatinib 20 mg daily dose (LENVIMA) 2 x 10 MG capsule Take 2 capsules (20 mg total) by mouth daily. 12/14/22   [name], MD  lidocaine-prilocaine (EMLA) cream Apply 1 application topically as needed. 04/04/21   Alla Feeling, NP  metFORMIN (GLUCOPHAGE-XR) 500 MG 24 hr tablet  07/14/21   [provider]  metoprolol succinate (TOPROL-XL) 50 MG 24 hr tablet TAKE 1 TABLET BY MOUTH DAILY. TAKE WITH OR IMMEDIATELY FOLLOWING A MEAL. 01/26/23   Lorretta Harp, MD  olmesartan (BENICAR) 40 MG tablet Take 1 tablet (40 mg total) by mouth daily. 08/29/22   Lorretta Harp, MD  ondansetron (ZOFRAN) 8 MG tablet Take 1 tablet (8 mg total) by mouth every 8 (eight) hours as needed for nausea or vomiting. 09/11/22   [name], MD  pantoprazole (PROTONIX) 40 MG tablet TAKE 1 TABLET BY MOUTH TWICE A DAY 05/21/22   [name], MD  Pembrolizumab [facility] IV) Inject into the vein.    [provider]  potassium chloride SA (KLOR-CON M20) 20 MEQ tablet Take 1 tablet (20 mEq total) by mouth daily. 11/28/22   [name], MD  sucralfate (CARAFATE) 1 g tablet Take 1 tablet (1 g total) by mouth every 6 (six) hours as needed. Please schedule a yearly follow  up for further refills: [phone] 06/15/20   Yetta Flock, MD  tamsulosin (FLOMAX) 0.4 MG CAPS capsule Take 0.4 mg by mouth daily.    [provider]      Allergies    Patient has no known allergies.    Review of Systems   Review of Systems  Skin:  Positive for rash.  All other systems reviewed and are negative.   Physical Exam Updated Vital Signs BP 136/85 (BP Location: Right Arm)   Pulse 79   Temp 97.7 F (36.5 C)   Resp 20   SpO2 100%  Physical Exam Vitals and nursing note reviewed.  Constitutional:      Comments: Chronically ill   HENT:     Head: Normocephalic.     Nose: Nose normal.     Mouth/Throat:     Mouth: Mucous membranes are moist.  Eyes:     Extraocular  Movements: Extraocular movements intact.     Pupils: Pupils are equal, round, and reactive to light.  Cardiovascular:     Rate and Rhythm: Normal rate and regular rhythm.     Pulses: Normal pulses.     Heart sounds: Normal heart sounds.  Pulmonary:     Effort: Pulmonary effort is normal.     Breath sounds: Normal breath sounds.  Abdominal:     Comments: Distended with fluid wave which is chronic  Musculoskeletal:     Cervical back: Normal range of motion and neck supple.     Comments: Bilateral leg cellulitis.  Please see picture.  Patient has 2+ DP pulses bilaterally  Skin:    General: Skin is warm.     Capillary Refill: Capillary refill takes less than 2 seconds.     Findings: Erythema present.  Neurological:     General: No focal deficit present.     Mental Status: He is alert and oriented to person, place, and time.  Psychiatric:        Mood and Affect: Mood normal.        Behavior: Behavior normal.     ED Results / Procedures / Treatments   Labs (all labs ordered are listed, but only abnormal results are displayed) Labs Reviewed  CBC WITH DIFFERENTIAL/PLATELET - Abnormal; Notable for the following components:      Result Value   RBC 4.16 (*)    Hemoglobin 12.4 (*)    HCT 37.7 (*)    RDW 18.6 (*)    Platelets 145 (*)    All other components within normal limits  COMPREHENSIVE METABOLIC PANEL - Abnormal; Notable for the following components:   Sodium 125 (*)    Chloride 95 (*)    Glucose, Bld 114 (*)    BUN 37 (*)    Creatinine, Ser 1.86 (*)    Calcium 8.1 (*)    Albumin 3.1 (*)    AST 85 (*)    GFR, Estimated 38 (*)    All other components within normal limits  LACTIC ACID, PLASMA  LACTIC ACID, PLASMA    EKG None  Radiology No results found.  Procedures Procedures    Medications Ordered in ED Medications  vancomycin (VANCOCIN) IVPB 1000 mg/200 mL premix (has no administration in time range)  ceFEPIme (MAXIPIME) 1 g in sodium chloride 0.9 % 100 mL  IVPB (has no administration in time range)    ED Course/ Medical Decision Making/ A&P  Medical Decision Making MAANAV BLINN is a 77 y.o. male here presenting with worsening cellulitis despite taking Keflex.  Patient has no fever.  Plan to do sepsis workup with CBC and CMP and lactate.  Plan to get some x-rays to make sure there is no here.  I am not sure if he has dependent edema versus worsening cellulitis.  Plan to give Vanco and cefepime empirically.  5:23 PM I reviewed patient's labs and independently interpreted imaging studies.  Patient's sodium is 125 and he appears slightly dehydrated so was given IV fluids.  Patient's creatinine is 1.8 which is baseline.  White blood cell count is normal and lactate is normal.  I independently interpreted x-rays and they did not show any air.  Patient was given Vanco and cefepime.  He is already on Keflex so we will change to clindamycin and doxycycline.   Problems Addressed: Cellulitis of lower extremity, unspecified laterality: acute illness or injury  Amount and/or Complexity of Data Reviewed Labs: ordered. Decision-making details documented in ED Course. Radiology: ordered and independent interpretation performed. Decision-making details documented in ED Course.  Risk Prescription drug management.    Final Clinical Impression(s) / ED Diagnoses Final diagnoses:  None    Rx / DC Orders ED Discharge Orders     None         Drenda Freeze, MD 01/29/23 1724

## 2023-01-29 NOTE — ED Notes (Signed)
Port de-accessed, flushed with 78m of NS Followed by 533mof Heparin 100u/ml

## 2023-01-29 NOTE — ED Notes (Addendum)
Pt's lower extremities edematous and red/hot.  Areas of redness are marked with a black marker.  Pt has been on Keflex x 5 days without any change.    Port accessed without difficulty

## 2023-01-29 NOTE — Discharge Instructions (Signed)
Please stop taking Keflex.  I have both switched you to clindamycin and doxycycline for a week  See your doctor for follow-up  Return to ER if you have worse redness, leg swelling, fever, purulent drainage

## 2023-01-29 NOTE — ED Triage Notes (Signed)
PT has phlebitis in R leg. Sent by PCP for IV antibiotics. Has been taking oral keflex for 5 days. Cancer of the liver but stopped treatment in January. Belly tapped every 2 weeks in IR. Oncologist is Dr. Burr Medico.

## 2023-02-01 ENCOUNTER — Ambulatory Visit (HOSPITAL_COMMUNITY)
Admission: RE | Admit: 2023-02-01 | Discharge: 2023-02-01 | Disposition: A | Discharge status: Home / Self Care | Payer: Medicare HMO | Source: Ambulatory Visit | Attending: Hematology | Admitting: Hematology

## 2023-02-01 DIAGNOSIS — R188 Other ascites: Secondary | ICD-10-CM | POA: Insufficient documentation

## 2023-02-01 DIAGNOSIS — C221 Intrahepatic bile duct carcinoma: Secondary | ICD-10-CM | POA: Diagnosis not present

## 2023-02-01 HISTORY — PX: IR PARACENTESIS: IMG2679

## 2023-02-01 MED ORDER — LIDOCAINE HCL 1 % IJ SOLN
INTRAMUSCULAR | Status: AC
  Administered 2023-02-01: 10 mL
  Filled 2023-02-01: qty 20

## 2023-02-01 MED ORDER — ALBUMIN HUMAN 25 % IV SOLN: 100.0000 g | Freq: Once | INTRAVENOUS | Status: AC

## 2023-02-01 MED ORDER — ALBUMIN HUMAN 25 % IV SOLN
INTRAVENOUS | Status: AC
  Administered 2023-02-01: 100 g via INTRAVENOUS
  Filled 2023-02-01: qty 300

## 2023-02-01 MED ORDER — ALBUMIN HUMAN 25 % IV SOLN
INTRAVENOUS | Status: AC
  Filled 2023-02-01: qty 100

## 2023-02-01 MED ORDER — ALBUMIN HUMAN 25 % IV SOLN: 100.0000 g | Freq: Once | INTRAVENOUS | Status: DC

## 2023-02-01 NOTE — Procedures (Signed)
PROCEDURE SUMMARY:  Successful US guided therapeutic paracentesis from RLQ.  Yielded 13.5 L of clear, yellow fluid.  No immediate complications.  Pt tolerated well.   Specimen not sent for labs.  EBL < 1 mL  Tyson Alias, AGNP 02/01/2023 10:47 AM

## 2023-02-05 ENCOUNTER — Telehealth: Payer: Self-pay | Admitting: Cardiovascular Disease

## 2023-02-05 NOTE — Telephone Encounter (Signed)
Called patient, advised that they would like to change the Lasix medication- he is currently on Monday,Wednesday, Friday- however recent kidney function shows it may not be a good idea to change, advised we would have to check with Dr.Berry to review.   Per oncologist note:  -we reviewed home palliative care and hospice again today, he is not ready and denied it -he has more ascites built up quickly, I removed the volume limitation (5L) in his paracentesis standing order, and my nurse called to schedule him for this Thursday        PLAN: -Pt decline Hospice/Palliative care to come in the home. -next paracentesis on 3/7 by IR, OK to remove as much as they can and will give albumin infusion if volume>5L  -lab/f/u in 1 month

## 2023-02-05 NOTE — Telephone Encounter (Signed)
Patient's wife called stating she wants to speak to nurse about her husband's furosemide (LASIX) 40 MG tablet.

## 2023-02-05 NOTE — Telephone Encounter (Signed)
Called patient, advised of message from MD.   Patient wife verbalized understanding

## 2023-02-08 ENCOUNTER — Ambulatory Visit (HOSPITAL_COMMUNITY)
Admission: RE | Admit: 2023-02-08 | Discharge: 2023-02-08 | Disposition: A | Discharge status: Home / Self Care | Payer: Medicare HMO | Source: Ambulatory Visit | Attending: Hematology | Admitting: Hematology

## 2023-02-08 DIAGNOSIS — C221 Intrahepatic bile duct carcinoma: Secondary | ICD-10-CM | POA: Diagnosis not present

## 2023-02-08 DIAGNOSIS — R188 Other ascites: Secondary | ICD-10-CM | POA: Diagnosis not present

## 2023-02-08 HISTORY — PX: IR PARACENTESIS: IMG2679

## 2023-02-08 MED ORDER — ALBUMIN HUMAN 25 % IV SOLN
INTRAVENOUS | Status: AC
  Filled 2023-02-08: qty 200

## 2023-02-08 MED ORDER — LIDOCAINE HCL 1 % IJ SOLN
INTRAMUSCULAR | Status: AC
  Administered 2023-02-08: 10 mL via SUBCUTANEOUS
  Filled 2023-02-08: qty 20

## 2023-02-08 MED ORDER — ALBUMIN HUMAN 25 % IV SOLN
75.0000 g | Freq: Once | INTRAVENOUS | Status: AC
  Administered 2023-02-08: 75 g via INTRAVENOUS
  Filled 2023-02-08: qty 300

## 2023-02-08 MED ORDER — ALBUMIN HUMAN 25 % IV SOLN
INTRAVENOUS | Status: AC
  Filled 2023-02-08: qty 100

## 2023-02-08 MED ORDER — HEPARIN SOD (PORK) LOCK FLUSH 100 UNIT/ML IV SOLN
INTRAVENOUS | Status: AC
  Filled 2023-02-08: qty 5

## 2023-02-08 NOTE — Procedures (Signed)
PROCEDURE SUMMARY:  Successful US guided paracentesis from right lateral abdomen. No limit based on Yeng's most recent progress note/order. Yielded 14.4 liters of amber fluid.  No immediate complications.  Pt tolerated well.   Specimen was not sent for labs.  EBL < 24mL  Docia Barrier PA-C 02/08/2023 10:33 AM

## 2023-02-15 ENCOUNTER — Ambulatory Visit (HOSPITAL_COMMUNITY)
Admission: RE | Admit: 2023-02-15 | Discharge: 2023-02-15 | Disposition: A | Discharge status: Home / Self Care | Payer: Medicare HMO | Source: Ambulatory Visit | Attending: Hematology | Admitting: Hematology

## 2023-02-15 DIAGNOSIS — C221 Intrahepatic bile duct carcinoma: Secondary | ICD-10-CM | POA: Insufficient documentation

## 2023-02-15 DIAGNOSIS — R188 Other ascites: Secondary | ICD-10-CM | POA: Insufficient documentation

## 2023-02-15 HISTORY — PX: IR PARACENTESIS: IMG2679

## 2023-02-15 MED ORDER — LIDOCAINE HCL 1 % IJ SOLN
INTRAMUSCULAR | Status: AC
  Administered 2023-02-15: 6 mL
  Filled 2023-02-15: qty 20

## 2023-02-15 MED ORDER — ALBUMIN HUMAN 25 % IV SOLN
INTRAVENOUS | Status: AC
  Administered 2023-02-15: 75 g via INTRAVENOUS
  Filled 2023-02-15: qty 400

## 2023-02-15 MED ORDER — ALBUMIN HUMAN 25 % IV SOLN: 100.0000 g | Freq: Once | INTRAVENOUS | Status: AC

## 2023-02-15 MED ORDER — HEPARIN SOD (PORK) LOCK FLUSH 100 UNIT/ML IV SOLN
INTRAVENOUS | Status: AC
  Filled 2023-02-15: qty 5

## 2023-02-15 MED ORDER — HEPARIN SOD (PORK) LOCK FLUSH 100 UNIT/ML IV SOLN
500.0000 [IU] | Freq: Once | INTRAVENOUS | Status: AC
  Administered 2023-02-15: 500 [IU] via INTRAVENOUS

## 2023-02-15 NOTE — Procedures (Addendum)
PROCEDURE SUMMARY:  Successful image-guided paracentesis from the left lower abdomen. No limited based on Dr Ernestina Penna most recent progress note/order. Yielded 11.5 liters of amber fluid.  No immediate complications.  EBL = trace. Patient tolerated well.   Specimen was not sent for labs.  Please see imaging section of Epic for full dictation.   Lura Em PA-C 02/15/2023 11:32 AM

## 2023-02-21 NOTE — Progress Notes (Unsigned)
[place]   Telephone:(336) [phone] Fax:(336) [phone]   Clinic Follow up Note   Patient Care Team: Marga Hoots, NP as PCP - General (Adult Health Nurse Practitioner) Lorretta Harp, MD as PCP - Cardiology (Cardiology) [name], MD as Consulting Physician (General Surgery) [name], [name], MD as Consulting Physician (Gastroenterology) [name], MD as Consulting Physician (Hematology) Lorretta Harp, MD as Consulting Physician (Cardiology)  Date of Service:  02/22/2023  CHIEF COMPLAINT: f/u of cholangiocarcinoma    CURRENT THERAPY:  Supportive care   ASSESSMENT:  Brandon Sherman is a 77 y.o. 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 -s/p first line Cisplatin and Gemcitabine, 09/29/19 - 07/19/20. Cisplatin discontinued 03/23/20 due to Afib. -he progressed through single agent gemcitabine (12/24/20 - 03/16/21), FOLFOX plus durvalumab (04/05/21 - 10/21/21), FOLFIRI (11/01/21 - 01/19/22), and then tried ivosidenib (02/02/22 - 04/2022) -he was switched to [place] on 05/15/22. He tolerated low-dose 80 mg daily well, but developed multiple side effects on 120 mg daily. He is tolerating 80mg  for 3 weeks on, 1 week off with no noticeable side effects. -due to disease progression, treatment changed to Lenvatinib and Keytruda recently  -treatment has been held since last week due to very poor performance status, anorexia, and new abdominal ascites -His nausea and low appetite have resolved now, he has recovered well from recent treatment. -There is no other standard treatment available.  Due to his limited performance status, I recommend palliative care and observation for now -we reviewed home palliative care and hospice again today, he is not ready and denied it -he has increased ascites and  required frequent large volume paracentesis lately  -continue supportive care, he again declines a palliative care or hospice home care  Goals of care, counseling/discussion -We previously discussed the incurable nature of his cancer, and the overall poor prognosis, especially if he does not have good response to chemotherapy or progress on chemo -The patient understands the goal of care is palliative. -I recommended DNR/DNI and he agreed in 01/2022   PLAN: -lab reviewed - Pt schedule for paracentesis 4/5 and will continue weekly as needed, no limitation on volume of paracentesis, and give albumin if >5L -lab/flush in 1 month    SUMMARY OF ONCOLOGIC HISTORY: Oncology History Overview Note  Cancer Staging Intrahepatic cholangiocarcinoma ([place]) Staging form: Intrahepatic Bile Duct, AJCC 8th Edition - Clinical stage from 08/19/2019: Stage II (cT2, cN0, cM0) - Signed by [name], MD on 08/19/2019    Intrahepatic cholangiocarcinoma  06/28/2019 Imaging   CT AP W Contrast 06/28/19  IMPRESSION: 1. Heterogeneous hypodensity posteriorly in the right hepatic lobe and potentially extending into the caudate lobe suspicious for a mass. There is felt to be truncation of branches of the portal vein in this vicinity and some narrowing of the hepatic vein, as well as triangular-shaped regions of abnormal hypoenhancement posteriorly in the right hepatic lobe likely representing downstream vascular effects. Cannot exclude malignancy such as cholangiocarcinoma or hepatocellular carcinoma, and follow up hepatic protocol MRI with and without contrast is recommended to further characterize. 2. 4 mm right middle lobe pulmonary nodule is likely benign but may merit surveillance. 3. Cholelithiasis. 4.  Aortic Atherosclerosis (ICD10-I70.0). 5. Prostatomegaly. 6. Mild impingement at L3-4 and L4-5.   07/31/2019 Imaging   MRI Liver 07/31/19 IMPRESSION: 1. 7.3 cm in long axis mass in  the right hepatic lobe spanning  into the caudate lobe, high suspicion for malignancy such as hepatocellular carcinoma or cholangiocarcinoma. Suspected effacement or occlusion of the right hepatic vein and posterior branches of the right portal vein. Two smaller tumor nodules along the posterior periphery of the dominant mass. Tissue diagnosis is recommended. 2. No findings of pathologic adenopathy or distant metastatic spread. 3. 9 mm gallstone in the gallbladder. There is mild gallbladder wall thickening which may be from nondistention, correlate clinically in assessing for cholecystitis. 4.  Aortic Atherosclerosis (ICD10-I70.0). 5. Mild diffuse hepatic steatosis.   08/11/2019 Initial Biopsy   DIAGNOSIS: 08/11/19  A. LIVER, RIGHT, BIOPSY:  - Adenocarcinoma.   08/18/2019 Imaging   CT Chest 08/18/19  IMPRESSION: 1. Multiple pulmonary nodules largest at approximately 7 mm in the right lower lobe, nonspecific but concerning given findings in the liver. 2. No signs of definitive metastatic disease, also with mildly enlarged upper abdominal lymph nodes as discussed.   Aortic Atherosclerosis (ICD10-I70.0).   08/19/2019 Initial Diagnosis   Intrahepatic cholangiocarcinoma ([place])   08/19/2019 Cancer Staging   Staging form: Intrahepatic Bile Duct, AJCC 8th Edition - Clinical stage from 08/19/2019: Stage II (cT2, cN0, cM0) - Signed by [name], MD on 08/19/2019   09/29/2019 - 07/19/2020 Chemotherapy   Cisplatin and Gemcitabine 2 weeks on/1 week off starting 09/29/19. Cisplatin held from cycle 9 (03/23/20) due to fluid status/Afib. He is now on maintenance Gemcitabine. Held after 07/19/20 to proceed with liver target therapy.     10/09/2019 Imaging   CT AP IMPRESSION: 1. The dominant right hepatic lobe mass is minimally reduced in size compared to prior exams, currently measuring 6.2 by 5.3 cm, previously 6.2 by 5.6 cm. However, there is a new small hypodense lesion centrally in the right hepatic lobe which is suspicious for a new  small focus of tumor. Accordingly this is an overall mixed appearance. 2. Continued hypoenhancement in the liver downstream of the tumor likely attributable to narrowing or occlusion of the right hepatic vein by the tumor. By virtue of its location the tumor wraps around the intrahepatic portion of the IVC. 3. 4 mm right middle lobe pulmonary nodule, stable compared to earliest available comparison of 07/18/2019. Surveillance of the patient's pulmonary nodules is recommended. 4. Other imaging findings of potential clinical significance: Coronary atherosclerosis. Cholelithiasis. Prominent stool throughout the colon favors constipation. Moderate prostatomegaly with heterogeneous enhancement of the prostate gland. Lumbar spondylosis and degenerative disc disease causing mild bilateral foraminal impingement at L3-4 and L4-5.   Aortic Atherosclerosis (ICD10-I70.0).   12/24/2019 Imaging   CT CAP W Contrast  IMPRESSION: 1. Right hepatic lobe mass and adjacent right hepatic lobe nodules appear grossly stable. No evidence of distant metastatic disease. 2. Continued stability of small pulmonary nodules. Recommend attention on follow-up. 3. Cholelithiasis. 4. Enlarged prostate. 5. Aortic atherosclerosis (ICD10-I70.0). Coronary artery calcification.   02/23/2020 Procedure   He had PAC placed on 02/23/20.    04/08/2020 Imaging   CT CAP  IMPRESSION: 1. Stable or minimally decreased size of a very ill-defined, hypodense and somewhat retractile appearing mass of the central right lobe of the liver abutting the inferior vena cava and right portal vein, measuring approximately 5.7 x 5.0 cm, previously 6.2 x 5.2 cm when measured similarly. Findings are consistent with stable or minimally improved cholangiocarcinoma. 2. Small hypodense nodules of the right lobe identified on prior examination are poorly appreciated on this single phase contrast examination although not grossly changed. Attention on  follow-up. 3. There  are new, moderate bilateral pleural effusions and associated atelectasis or consolidation as well as a new small pericardial effusion, nonspecific although generally concerning and suspicious for malignant effusions. There is no directly visualized pleural nodularity. 4. Multiple small pulmonary nodules are stable.  No new nodules. 5. Coronary artery disease. Aortic Atherosclerosis (ICD10-I70.0).   07/06/2020 Imaging   MRI ABD IMPRESSION: 1. Interval decrease in size of the right hepatic lobe lesion in the medial aspect of the segment 7. Findings would suggest a good response to treatment with some contraction of the tumor. 2. No new hepatic lesions. No abdominal adenopathy or metastatic disease. 3. Stable mild intrahepatic biliary dilatation in the right hepatic lobe distal to the lesion. 4. Somewhat tortuous and almost beaded appearance of the hepatic and portal vein radicles. Findings could be radiation related. 5. Cholelithiasis.  CT chest wo contrast IMPRESSION: 1. Persistent/stable moderate-sized bilateral pleural effusions with overlying atelectasis. 2. Stable small right pulmonary nodules. No new or progressive findings. Recommend continued surveillance. 3. No mediastinal or hilar mass or adenopathy. 4. Stable advanced three-vessel coronary artery calcifications. 5. Cholelithiasis. Aortic Atherosclerosis (ICD10-I70.0)   08/11/2020 Procedure   Y90 on 08/11/20 and 08/26/20 with Dr Laurence Ferrari    11/30/2020 Imaging   MRI Abdomen  IMPRESSION: 1. Today's study demonstrates progression of disease with enlarging central lesion in the right lobe of the liver involving portions of segments 7 and 8, now with evidence of some tumor thrombus extension into the intrahepatic portion of the inferior vena cava. This is associated with increasing intrahepatic biliary ductal dilatation in segment 7 of the liver. 2. In addition, there is some subtle hyperenhancement of the  distal common bile duct at and immediately proximally to the level of the ampulla. There is also some subtle delayed enhancement around this region in the pancreatic head. This is of uncertain etiology and significance, but may be inflammatory and currently is not associated with proximal common bile duct dilatation. However, close attention on follow-up imaging is recommended. 3. Cholelithiasis without evidence of acute cholecystitis at this time. 4. Aortic atherosclerosis.     12/24/2020 - 03/21/2021 Chemotherapy   Restart Gemcitabine 2 weeks on/1 week off given disease progression beginning 12/24/20. Discontinued after 03/21/21 due to disease progression in liver.     03/16/2021 Imaging   MRI abdomen  IMPRESSION: No significant change in size of irregular hypovascular mass in the medial right hepatic lobe.   New 1.1 cm hypovascular lesion with peripheral rim enhancement in liver dome, suspicious for metastatic disease.   New small right pleural effusion and mild ascites.   Cholelithiasis, without evidence of cholecystitis or biliary dilatation.   04/05/2021 -  Chemotherapy   Second-line FOLFOX q2weeks starting 04/05/21    04/05/2021 -  Chemotherapy   Immunotherapy with Durvalumab ([place]) q4weeks starting 04/05/21. (in addition to second line FOLFOX)   06/23/2021 Imaging   MRI Abdomen IMPRESSION: 1. No significant change in subcapsular mass of the posterior right lobe of the liver. 2. No significant change in an additional small hyperenhancing lesion of the liver dome, which remains suspicious for a satellite lesion. 3. No new liver lesions. 4. 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 5. Cholelithiasis without evidence of acute cholecystitis. 6. Trace ascites.  CT Chest w/o contrast IMPRESSION: 1. Occasional small pulmonary nodules in the right lung are stable, for example a 6 mm nodule right lower lobe and a 3 mm nodule of the lateral segment right middle  lobe. These are nonspecific and likely benign, incidental  sequelae of infection or inflammation, metastatic disease not favored. Attention on follow-up. 2. Previously seen bilateral pleural effusions are resolved. 3. Hypodense lesion of the posterior right lobe of the liver, keeping with known cholangiocarcinoma and better characterized by same day MR. 4. Coronary artery disease.   10/21/2021 Imaging   EXAM: MRI ABDOMEN WITHOUT AND WITH CONTRAST  IMPRESSION: 1. Mild progression of dominant right hepatic lobe mass with involvement of the right portal, hepatic veins and minimal extension into the IVC. 2. Hepatic dome satellite lesion, new or more conspicuous today. 3. Cholelithiasis 4. Decrease in trace perihepatic and perisplenic ascites. 5.  Aortic Atherosclerosis (ICD10-I70.0).   10/21/2021 Imaging   EXAM: CT CHEST WITHOUT CONTRAST  IMPRESSION: 1. Since 06/23/2021, enlargement of a right lower lobe and possible enlargement of a right middle lobe pulmonary nodule. Findings are suspicious for metastatic disease. 2. No thoracic adenopathy. 3. Coronary artery atherosclerosis. Aortic Atherosclerosis (ICD10-I70.0).   11/01/2021 - 01/11/2022 Chemotherapy   Patient is on Treatment Plan : PANCREAS FOLFIRI q14d     01/19/2022 Imaging   EXAM: CT CHEST WITHOUT CONTRAST  IMPRESSION: 1. Continued increase in size of lung nodule within the posterior right lung base which now measures 1 cm and is suspicious for metastatic disease. The remaining lung nodules are stable from 10/21/2021. 2. No thoracic adenopathy. 3. Aortic Atherosclerosis (ICD10-I70.0). Coronary artery calcifications.   01/19/2022 Imaging   EXAM: MRI ABDOMEN WITHOUT AND WITH CONTRAST  IMPRESSION: 1. No significant change in size or appearance of the large heterogeneous enhancing mass area in the right hepatic lobe as described. There is mild interval enlargement of the adjacent satellite lesion near the dome of the right  lobe now measuring 1.9 cm. 2. Redemonstration of a pulmonary nodule in the right lower lobe measuring 9 mm. 3. Small volume ascites. 4. Splenomegaly. 5. Cholelithiasis.   12/08/2022 - 12/08/2022 Chemotherapy   Patient is on Treatment Plan : UTERINE Lenvatinib (20) D1-21 + Pembrolizumab (200) D1 q21d        INTERVAL HISTORY:  Brandon Sherman is here for a follow up of cholangiocarcinoma   He was last seen by me on 01/22/2023 He presents to the clinic accompanied by wife. Pt denies having dizziness. Pt  state that he had an infection in his leg. Pt state that his appetite is good. He reports of having good days and bad days. Pt deines having ain and nausea.      All other systems were reviewed with the patient and are negative.  MEDICAL HISTORY:  Past Medical History:  Diagnosis Date   Arthritis    Diabetes    GERD (gastroesophageal reflux disease)    Hyperlipidemia    Hypertension    Intrahepatic cholangiocarcinoma     SURGICAL HISTORY: Past Surgical History:  Procedure Laterality Date   APPENDECTOMY  1980   CARDIOVERSION N/A 04/27/2020   Procedure: CARDIOVERSION;  Surgeon: Dorothy Spark, MD;  Location: [facility];  Service: Cardiovascular;  Laterality: N/A;   CARDIOVERSION N/A 05/19/2020   Procedure: CARDIOVERSION;  Surgeon: Elouise Munroe, MD;  Location: [facility];  Service: Cardiovascular;  Laterality: N/A;   COLONOSCOPY     IR 3D INDEPENDENT WKST  08/11/2020   IR 3D INDEPENDENT WKST  08/11/2020   IR ANGIOGRAM SELECTIVE EACH ADDITIONAL VESSEL  08/11/2020   IR ANGIOGRAM SELECTIVE EACH ADDITIONAL VESSEL  08/11/2020   IR ANGIOGRAM SELECTIVE EACH ADDITIONAL VESSEL  08/11/2020   IR ANGIOGRAM SELECTIVE EACH ADDITIONAL VESSEL  08/11/2020   IR ANGIOGRAM  SELECTIVE EACH ADDITIONAL VESSEL  08/11/2020   IR ANGIOGRAM SELECTIVE EACH ADDITIONAL VESSEL  08/11/2020   IR ANGIOGRAM SELECTIVE EACH ADDITIONAL VESSEL  08/26/2020   IR ANGIOGRAM SELECTIVE EACH ADDITIONAL VESSEL   08/26/2020   IR ANGIOGRAM VISCERAL SELECTIVE  08/11/2020   IR ANGIOGRAM VISCERAL SELECTIVE  08/26/2020   IR EMBO ARTERIAL NOT HEMORR HEMANG INC GUIDE ROADMAPPING  08/11/2020   IR EMBO TUMOR ORGAN ISCHEMIA INFARCT INC GUIDE ROADMAPPING  08/26/2020   IR IMAGING GUIDED PORT INSERTION  02/23/2020   IR PARACENTESIS  01/05/2023   IR PARACENTESIS  01/18/2023   IR PARACENTESIS  01/25/2023   IR PARACENTESIS  02/01/2023   IR PARACENTESIS  02/08/2023   IR PARACENTESIS  02/15/2023   IR RADIOLOGIST EVAL & MGMT  07/14/2020   IR RADIOLOGIST EVAL & MGMT  09/30/2020   IR RADIOLOGIST EVAL & MGMT  12/01/2020   IR US GUIDE VASC ACCESS LEFT  08/26/2020   IR US GUIDE VASC ACCESS RIGHT  08/11/2020    I have reviewed the social history and family history with the patient and they are unchanged from previous note.  ALLERGIES:  has No Known Allergies.  MEDICATIONS:  Current Outpatient Medications  Medication Sig Dispense Refill   allopurinol (ZYLOPRIM) 100 MG tablet Take 100 mg by mouth daily.     apixaban (ELIQUIS) 5 MG TABS tablet TAKE 1 TABLET TWICE DAILY 180 tablet 1   atorvastatin (LIPITOR) 40 MG tablet Take 1 tablet (40 mg total) by mouth daily. 90 tablet 3   clindamycin (CLEOCIN) 300 MG capsule Take 1 capsule (300 mg total) by mouth 3 (three) times daily. 21 capsule 0   doxycycline (VIBRAMYCIN) 100 MG capsule Take 1 capsule (100 mg total) by mouth 2 (two) times daily. One po bid x 7 days 14 capsule 0   fenofibrate (TRICOR) 145 MG tablet Take 145 mg by mouth daily.     finasteride (PROSCAR) 5 MG tablet Take 5 mg by mouth daily.     furosemide (LASIX) 40 MG tablet TAKE 2 TABLETS BY MOUTH 2 TIMES DAILY. (Patient taking differently: Take 40 mg by mouth 3 (three) times a week. TAKE ONE TABLET (40 mg) MONDAY, WEDNESDAY, AND FRIDAY.) 360 tablet 3   gabapentin (NEURONTIN) 100 MG capsule Take 100 mg by mouth at bedtime.     hydrALAZINE (APRESOLINE) 50 MG tablet Take 1.5 tablets (75 mg total) by mouth 3 (three) times daily. 135  tablet 3   lenvatinib 20 mg daily dose (LENVIMA) 2 x 10 MG capsule Take 2 capsules (20 mg total) by mouth daily. 60 capsule 0   lidocaine-prilocaine (EMLA) cream Apply 1 application topically as needed. 30 g 3   metFORMIN (GLUCOPHAGE-XR) 500 MG 24 hr tablet      metoprolol succinate (TOPROL-XL) 50 MG 24 hr tablet TAKE 1 TABLET BY MOUTH DAILY. TAKE WITH OR IMMEDIATELY FOLLOWING A MEAL. 90 tablet 2   olmesartan (BENICAR) 40 MG tablet Take 1 tablet (40 mg total) by mouth daily. 90 tablet 3   ondansetron (ZOFRAN) 8 MG tablet Take 1 tablet (8 mg total) by mouth every 8 (eight) hours as needed for nausea or vomiting. 30 tablet 1   pantoprazole (PROTONIX) 40 MG tablet TAKE 1 TABLET BY MOUTH TWICE A DAY 180 tablet 2   potassium chloride SA (KLOR-CON M20) 20 MEQ tablet Take 1 tablet (20 mEq total) by mouth daily. 7 tablet 0   sucralfate (CARAFATE) 1 g tablet Take 1 tablet (1 g total) by mouth every  6 (six) hours as needed. Please schedule a yearly follow up for further refills: [phone] 60 tablet 0   tamsulosin (FLOMAX) 0.4 MG CAPS capsule Take 0.4 mg by mouth daily.     No current facility-administered medications for this visit.    PHYSICAL EXAMINATION: ECOG PERFORMANCE STATUS: 2 - Symptomatic, <50% confined to bed  Vitals:   02/22/23 1054  BP: 137/68  Pulse: 89  Resp: 18  Temp: 97.7 F (36.5 C)  SpO2: 100%   Wt Readings from Last 3 Encounters:  02/22/23 207 lb 1.6 oz (93.9 kg)  01/22/23 219 lb 9.6 oz (99.6 kg)  12/29/22 200 lb 4.8 oz (90.9 kg)     GENERAL:alert, no distress and comfortable SKIN: skin color normal, no rashes or significant lesions EYES: normal, Conjunctiva are pink and non-injected, sclera clear  NEURO: alert & oriented x 3 with fluent speech    LABORATORY DATA:  I have reviewed the data as listed    Latest Ref Rng & Units 02/22/2023   10:24 AM 01/29/2023    1:01 PM 01/22/2023    3:13 PM  CBC  WBC 4.0 - 10.5 K/uL 6.4  7.0  6.8   Hemoglobin 13.0 - 17.0 g/dL  11.2  12.4  12.2   Hematocrit 39.0 - 52.0 % 33.4  37.7  35.9   Platelets 150 - 400 K/uL 115  145  162         Latest Ref Rng & Units 02/22/2023   10:24 AM 01/29/2023    2:30 PM 01/22/2023    3:13 PM  CMP  Glucose 70 - 99 mg/dL 120  114  120   BUN 8 - 23 mg/dL 26  37  27   Creatinine 0.61 - 1.24 mg/dL 1.52  1.86  1.74   Sodium 135 - 145 mmol/L 134  125  135   Potassium 3.5 - 5.1 mmol/L 4.2  4.0  3.7   Chloride 98 - 111 mmol/L 106  95  103   CO2 22 - 32 mmol/L 22  24  25    Calcium 8.9 - 10.3 mg/dL 9.1  8.1  8.7   Total Protein 6.5 - 8.1 g/dL 6.4  7.0  6.6   Total Bilirubin 0.3 - 1.2 mg/dL 1.0  1.0  0.9   Alkaline Phos 38 - 126 U/L 62  105  104   AST 15 - 41 U/L 46  85  48   ALT 0 - 44 U/L 30  40  27       RADIOGRAPHIC STUDIES: I have personally reviewed the radiological images as listed and agreed with the findings in the report. No results found.    No orders of the defined types were placed in this encounter.  All questions were answered. The patient knows to call the clinic with any problems, questions or concerns. No barriers to learning was detected. The total time spent in the appointment was 20 minutes.     [name], MD 02/22/2023   Felicity Coyer, CMA, am acting as scribe for [name], MD.   I have reviewed the above documentation for accuracy and completeness, and I agree with the above.

## 2023-02-22 ENCOUNTER — Other Ambulatory Visit: Payer: Self-pay

## 2023-02-22 ENCOUNTER — Inpatient Hospital Stay: Payer: Medicare HMO | Attending: Hematology

## 2023-02-22 ENCOUNTER — Inpatient Hospital Stay (HOSPITAL_BASED_OUTPATIENT_CLINIC_OR_DEPARTMENT_OTHER): Payer: Medicare HMO | Admitting: Hematology

## 2023-02-22 ENCOUNTER — Encounter: Payer: Self-pay | Admitting: Hematology

## 2023-02-22 VITALS — BP 137/68 | HR 89 | Temp 97.7°F | Resp 18 | Ht 69.0 in | Wt 207.1 lb

## 2023-02-22 DIAGNOSIS — C221 Intrahepatic bile duct carcinoma: Secondary | ICD-10-CM | POA: Insufficient documentation

## 2023-02-22 DIAGNOSIS — R918 Other nonspecific abnormal finding of lung field: Secondary | ICD-10-CM | POA: Diagnosis not present

## 2023-02-22 DIAGNOSIS — Z95828 Presence of other vascular implants and grafts: Secondary | ICD-10-CM

## 2023-02-22 DIAGNOSIS — R188 Other ascites: Secondary | ICD-10-CM | POA: Diagnosis not present

## 2023-02-22 DIAGNOSIS — E860 Dehydration: Secondary | ICD-10-CM

## 2023-02-22 DIAGNOSIS — Z79899 Other long term (current) drug therapy: Secondary | ICD-10-CM | POA: Insufficient documentation

## 2023-02-22 LAB — COMPREHENSIVE METABOLIC PANEL
ALT: 30 U/L (ref 0–44)
AST: 46 U/L — ABNORMAL HIGH (ref 15–41)
Albumin: 3.5 g/dL (ref 3.5–5.0)
Alkaline Phosphatase: 62 U/L (ref 38–126)
Anion gap: 6 (ref 5–15)
BUN: 26 mg/dL — ABNORMAL HIGH (ref 8–23)
CO2: 22 mmol/L (ref 22–32)
Calcium: 9.1 mg/dL (ref 8.9–10.3)
Chloride: 106 mmol/L (ref 98–111)
Creatinine, Ser: 1.52 mg/dL — ABNORMAL HIGH (ref 0.61–1.24)
GFR, Estimated: 47 mL/min — ABNORMAL LOW (ref >60)
Glucose, Bld: 120 mg/dL — ABNORMAL HIGH (ref 70–99)
Potassium: 4.2 mmol/L (ref 3.5–5.1)
Sodium: 134 mmol/L — ABNORMAL LOW (ref 135–145)
Total Bilirubin: 1 mg/dL (ref 0.3–1.2)
Total Protein: 6.4 g/dL — ABNORMAL LOW (ref 6.5–8.1)

## 2023-02-22 LAB — CBC WITH DIFFERENTIAL/PLATELET
Abs Immature Granulocytes: 0.02 10*3/uL (ref 0.00–0.07)
Basophils Absolute: 0.1 10*3/uL (ref 0.0–0.1)
Basophils Relative: 1 %
Eosinophils Absolute: 0.1 10*3/uL (ref 0.0–0.5)
Eosinophils Relative: 1 %
HCT: 33.4 % — ABNORMAL LOW (ref 39.0–52.0)
Hemoglobin: 11.2 g/dL — ABNORMAL LOW (ref 13.0–17.0)
Immature Granulocytes: 0 %
Lymphocytes Relative: 11 %
Lymphs Abs: 0.7 10*3/uL (ref 0.7–4.0)
MCH: 31.1 pg (ref 26.0–34.0)
MCHC: 33.5 g/dL (ref 30.0–36.0)
MCV: 92.8 fL (ref 80.0–100.0)
Monocytes Absolute: 0.6 10*3/uL (ref 0.1–1.0)
Monocytes Relative: 9 %
Neutro Abs: 5 10*3/uL (ref 1.7–7.7)
Neutrophils Relative %: 78 %
Platelets: 115 10*3/uL — ABNORMAL LOW (ref 150–400)
RBC: 3.6 MIL/uL — ABNORMAL LOW (ref 4.22–5.81)
RDW: 18 % — ABNORMAL HIGH (ref 11.5–15.5)
WBC: 6.4 10*3/uL (ref 4.0–10.5)
nRBC: 0 % (ref 0.0–0.2)

## 2023-02-22 MED ORDER — HEPARIN SOD (PORK) LOCK FLUSH 100 UNIT/ML IV SOLN
500.0000 [IU] | Freq: Once | INTRAVENOUS | Status: AC
  Administered 2023-02-22: 500 [IU]

## 2023-02-22 MED ORDER — SODIUM CHLORIDE 0.9% FLUSH
10.0000 mL | Freq: Once | INTRAVENOUS | Status: AC
  Administered 2023-02-22: 10 mL

## 2023-02-22 NOTE — Assessment & Plan Note (Signed)
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 -s/p first line Cisplatin and Gemcitabine, 09/29/19 - 07/19/20. Cisplatin discontinued 03/23/20 due to Afib. -he progressed through single agent gemcitabine (12/24/20 - 03/16/21), FOLFOX plus durvalumab (04/05/21 - 10/21/21), FOLFIRI (11/01/21 - 01/19/22), and then tried ivosidenib (02/02/22 - 04/2022) -he was switched to [place] on 05/15/22. He tolerated low-dose 80 mg daily well, but developed multiple side effects on 120 mg daily. He is tolerating 80mg  for 3 weeks on, 1 week off with no noticeable side effects. -due to disease progression, treatment changed to Lenvatinib and Keytruda recently  -treatment has been held since last week due to very poor performance status, anorexia, and new abdominal ascites -His nausea and low appetite have resolved now, he has recovered well from recent treatment. -There is no other standard treatment available.  Due to his limited performance status, I recommend palliative care and observation for now -we reviewed home palliative care and hospice again today, he is not ready and denied it -he has increased ascites and required frequent large volume paracentesis lately

## 2023-02-23 ENCOUNTER — Other Ambulatory Visit: Payer: Self-pay

## 2023-02-23 ENCOUNTER — Ambulatory Visit (HOSPITAL_COMMUNITY)
Admission: RE | Admit: 2023-02-23 | Discharge: 2023-02-23 | Disposition: A | Discharge status: Home / Self Care | Payer: Medicare HMO | Source: Ambulatory Visit | Attending: Hematology | Admitting: Hematology

## 2023-02-23 DIAGNOSIS — R188 Other ascites: Secondary | ICD-10-CM | POA: Diagnosis not present

## 2023-02-23 DIAGNOSIS — C221 Intrahepatic bile duct carcinoma: Secondary | ICD-10-CM | POA: Insufficient documentation

## 2023-02-23 HISTORY — PX: IR PARACENTESIS: IMG2679

## 2023-02-23 MED ORDER — HEPARIN SOD (PORK) LOCK FLUSH 100 UNIT/ML IV SOLN
500.0000 [IU] | Freq: Once | INTRAVENOUS | Status: AC
  Administered 2023-02-23: 500 [IU]

## 2023-02-23 MED ORDER — LIDOCAINE HCL 1 % IJ SOLN
INTRAMUSCULAR | Status: AC
  Filled 2023-02-23: qty 20

## 2023-02-23 MED ORDER — ALBUMIN HUMAN 25 % IV SOLN
50.0000 g | Freq: Once | INTRAVENOUS | Status: AC
  Administered 2023-02-23: 50 g via INTRAVENOUS
  Filled 2023-02-23: qty 200

## 2023-02-23 MED ORDER — ALBUMIN HUMAN 25 % IV SOLN
INTRAVENOUS | Status: AC
  Filled 2023-02-23: qty 200

## 2023-02-23 MED ORDER — HEPARIN SOD (PORK) LOCK FLUSH 100 UNIT/ML IV SOLN
INTRAVENOUS | Status: AC
  Filled 2023-02-23: qty 5

## 2023-02-23 NOTE — Procedures (Signed)
PROCEDURE SUMMARY:  Successful US guided paracentesis from right lateral abdomen.  Yielded 13.5 liters of yellow fluid.  No immediate complications.  Pt tolerated well.   Specimen was not sent for labs.  Patient to receive albumin with procedure today.   EBL < 56mL  Hoyt Koch PA-C 02/23/2023 10:07 AM

## 2023-02-26 ENCOUNTER — Other Ambulatory Visit: Payer: Self-pay

## 2023-03-01 ENCOUNTER — Ambulatory Visit (HOSPITAL_COMMUNITY)
Admission: RE | Admit: 2023-03-01 | Discharge: 2023-03-01 | Disposition: A | Discharge status: Home / Self Care | Payer: Medicare HMO | Source: Ambulatory Visit | Attending: Hematology | Admitting: Hematology

## 2023-03-01 DIAGNOSIS — C221 Intrahepatic bile duct carcinoma: Secondary | ICD-10-CM | POA: Insufficient documentation

## 2023-03-01 DIAGNOSIS — R188 Other ascites: Secondary | ICD-10-CM | POA: Diagnosis not present

## 2023-03-01 HISTORY — PX: IR PARACENTESIS: IMG2679

## 2023-03-01 MED ORDER — ALBUMIN HUMAN 25 % IV SOLN
INTRAVENOUS | Status: AC
  Filled 2023-03-01: qty 300

## 2023-03-01 MED ORDER — ALBUMIN HUMAN 25 % IV SOLN
75.0000 g | Freq: Once | INTRAVENOUS | Status: AC
  Administered 2023-03-01: 75 g via INTRAVENOUS

## 2023-03-01 MED ORDER — LIDOCAINE HCL 1 % IJ SOLN
10.0000 mL | Freq: Once | INTRAMUSCULAR | Status: AC
  Administered 2023-03-01: 10 mL via INTRADERMAL

## 2023-03-01 MED ORDER — HEPARIN SOD (PORK) LOCK FLUSH 100 UNIT/ML IV SOLN
INTRAVENOUS | Status: AC
  Filled 2023-03-01: qty 5

## 2023-03-01 MED ORDER — LIDOCAINE HCL 1 % IJ SOLN
INTRAMUSCULAR | Status: AC
  Filled 2023-03-01: qty 20

## 2023-03-01 MED ORDER — HEPARIN SOD (PORK) LOCK FLUSH 100 UNIT/ML IV SOLN: 500.0000 [IU] | Freq: Once | INTRAVENOUS | Status: DC

## 2023-03-01 NOTE — Procedures (Signed)
PROCEDURE SUMMARY:  Successful image-guided paracentesis from the left lower abdomen.  Yielded 12.5 liters of yellow fluid.  No immediate complications.  EBL = trace. Patient tolerated well.   Specimen was not sent for labs.  Please see imaging section of Epic for full dictation.   Kennieth Francois PA-C 03/01/2023 12:23 PM

## 2023-03-08 ENCOUNTER — Ambulatory Visit (HOSPITAL_COMMUNITY)
Admission: RE | Admit: 2023-03-08 | Discharge: 2023-03-08 | Disposition: A | Discharge status: Home / Self Care | Payer: Medicare HMO | Source: Ambulatory Visit | Attending: Hematology | Admitting: Hematology

## 2023-03-08 DIAGNOSIS — C221 Intrahepatic bile duct carcinoma: Secondary | ICD-10-CM | POA: Insufficient documentation

## 2023-03-08 DIAGNOSIS — R188 Other ascites: Secondary | ICD-10-CM | POA: Insufficient documentation

## 2023-03-08 HISTORY — PX: IR PARACENTESIS: IMG2679

## 2023-03-08 MED ORDER — ALBUMIN HUMAN 25 % IV SOLN
INTRAVENOUS | Status: AC
  Filled 2023-03-08: qty 100

## 2023-03-08 MED ORDER — HEPARIN SOD (PORK) LOCK FLUSH 100 UNIT/ML IV SOLN
INTRAVENOUS | Status: AC
  Filled 2023-03-08: qty 5

## 2023-03-08 MED ORDER — ALBUMIN HUMAN 25 % IV SOLN
INTRAVENOUS | Status: AC
  Filled 2023-03-08: qty 200

## 2023-03-08 MED ORDER — ALBUMIN HUMAN 25 % IV SOLN
75.0000 g | Freq: Once | INTRAVENOUS | Status: AC
  Administered 2023-03-08: 75 g via INTRAVENOUS

## 2023-03-08 NOTE — Procedures (Signed)
PROCEDURE SUMMARY:  Successful US guided paracentesis from right lateral abdomen.  Yielded 13.5 of clear, yellow fluid.  No immediate complications.  Pt tolerated well.   Specimen was not sent for labs.  EBL < 5mL  Hoyt Koch PA-C 03/08/2023 10:31 AM

## 2023-03-12 ENCOUNTER — Encounter: Payer: Self-pay | Admitting: Hematology

## 2023-03-12 DIAGNOSIS — C221 Intrahepatic bile duct carcinoma: Secondary | ICD-10-CM

## 2023-03-13 ENCOUNTER — Other Ambulatory Visit: Payer: Self-pay

## 2023-03-13 DIAGNOSIS — C221 Intrahepatic bile duct carcinoma: Secondary | ICD-10-CM

## 2023-03-15 ENCOUNTER — Ambulatory Visit (HOSPITAL_COMMUNITY)
Admission: RE | Admit: 2023-03-15 | Discharge: 2023-03-15 | Disposition: A | Discharge status: Home / Self Care | Payer: Medicare HMO | Source: Ambulatory Visit | Attending: Hematology | Admitting: Hematology

## 2023-03-15 DIAGNOSIS — R188 Other ascites: Secondary | ICD-10-CM | POA: Insufficient documentation

## 2023-03-15 DIAGNOSIS — C221 Intrahepatic bile duct carcinoma: Secondary | ICD-10-CM | POA: Insufficient documentation

## 2023-03-15 HISTORY — PX: IR PARACENTESIS: IMG2679

## 2023-03-15 MED ORDER — LIDOCAINE HCL 1 % IJ SOLN
INTRAMUSCULAR | Status: AC
  Filled 2023-03-15: qty 20

## 2023-03-15 MED ORDER — ALBUMIN HUMAN 25 % IV SOLN
INTRAVENOUS | Status: AC
  Filled 2023-03-15: qty 300

## 2023-03-15 MED ORDER — ALBUMIN HUMAN 25 % IV SOLN
75.0000 g | Freq: Once | INTRAVENOUS | Status: AC
  Administered 2023-03-15: 75 g via INTRAVENOUS
  Filled 2023-03-15: qty 300

## 2023-03-15 MED ORDER — HEPARIN SOD (PORK) LOCK FLUSH 100 UNIT/ML IV SOLN
500.0000 [IU] | Freq: Once | INTRAVENOUS | Status: AC
  Administered 2023-03-15: 500 [IU]

## 2023-03-15 MED ORDER — HEPARIN SOD (PORK) LOCK FLUSH 100 UNIT/ML IV SOLN
INTRAVENOUS | Status: AC
  Filled 2023-03-15: qty 5

## 2023-03-15 NOTE — Procedures (Signed)
PROCEDURE SUMMARY:  Successful US guided paracentesis from right abdomen.  Yielded 14 L of clear yellow fluid.  No immediate complications.  Pt tolerated well.   Specimen not sent for labs.  EBL < 2 mL  Mickie Kay, NP 03/15/2023 12:12 PM

## 2023-03-20 NOTE — Progress Notes (Unsigned)
[facility]   Telephone:(336) [phone] [phone]   Clinic Follow up Note   Patient Care Team: Doran Stabler, NP as PCP - General (Adult Health Nurse Practitioner) Runell Gess, MD as PCP - Cardiology (Cardiology) Almond Lint, MD as Consulting Physician (General Surgery) [name], Willaim Rayas, MD as Consulting Physician (Gastroenterology) Malachy Mood, MD as Consulting Physician (Hematology) Runell Gess, MD as Consulting Physician (Cardiology)  Date of Service:  03/21/2023  CHIEF COMPLAINT: f/u of cholangiocarcinoma      CURRENT THERAPY:  Supportive care  ASSESSMENT:  Brandon Sherman is a 77 y.o. male with   Intrahepatic cholangiocarcinoma (HCC) 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 -s/p first line Cisplatin and Gemcitabine, 09/29/19 - 07/19/20. Cisplatin discontinued 03/23/20 due to Afib. -he progressed through single agent gemcitabine (12/24/20 - 03/16/21), FOLFOX plus durvalumab (04/05/21 - 10/21/21), FOLFIRI (11/01/21 - 01/19/22), and then tried ivosidenib (02/02/22 - 04/2022) -he was switched to Stivarga on 05/15/22. He tolerated low-dose 80 mg daily well, but developed multiple side effects on 120 mg daily. He is tolerating 80mg  for 3 weeks on, 1 week off with no noticeable side effects. -due to disease progression, treatment changed to Lenvatinib and Keytruda recently  -treatment has been held since last week due to very poor performance status, anorexia, and new abdominal ascites -His nausea and low appetite have resolved now, he has recovered well from recent treatment. -There is no other standard treatment available.  Due to his limited performance status, I recommend palliative care and observation for now -we reviewed home palliative care and hospice again today, he is not ready and denied it -he has increased ascites and  required frequent large volume paracentesis lately  -continue supportive care, he again declines a palliative care or hospice home care  Goals of care, counseling/discussion -We previously discussed the incurable nature of his cancer, and the overall poor prognosis, especially if he does not have good response to chemotherapy or progress on chemo -The patient understands the goal of care is palliative. -I recommended DNR/DNI and he agreed in 01/2022       PLAN: -lab reviewed -I refill Lidocaine Cream -standing order to Continue IR Paracentesis, no limitation on volume . Give Albumin if >5L -lab/f/u in 6 weeks    SUMMARY OF ONCOLOGIC HISTORY: Oncology History Overview Note  Cancer Staging Intrahepatic cholangiocarcinoma (HCC) Staging form: Intrahepatic Bile Duct, AJCC 8th Edition - Clinical stage from 08/19/2019: Stage II (cT2, cN0, cM0) - Signed by Malachy Mood, MD on 08/19/2019    Intrahepatic cholangiocarcinoma (HCC)  06/28/2019 Imaging   CT AP W Contrast 06/28/19  IMPRESSION: 1. Heterogeneous hypodensity posteriorly in the right hepatic lobe and potentially extending into the caudate lobe suspicious for a mass. There is felt to be truncation of branches of the portal vein in this vicinity and some narrowing of the hepatic vein, as well as triangular-shaped regions of abnormal hypoenhancement posteriorly in the right hepatic lobe likely representing downstream vascular effects. Cannot exclude malignancy such as cholangiocarcinoma or hepatocellular carcinoma, and follow up hepatic protocol MRI with and without contrast is recommended to further characterize. 2. 4 mm right middle lobe pulmonary nodule is likely benign but may merit surveillance. 3. Cholelithiasis. 4.  Aortic Atherosclerosis (ICD10-I70.0). 5. Prostatomegaly. 6. Mild impingement at L3-4 and L4-5.   07/31/2019 Imaging   MRI Liver 07/31/19 IMPRESSION: 1. 7.3 cm in long axis  mass in the right hepatic lobe spanning  into the caudate lobe, high suspicion for malignancy such as hepatocellular carcinoma or cholangiocarcinoma. Suspected effacement or occlusion of the right hepatic vein and posterior branches of the right portal vein. Two smaller tumor nodules along the posterior periphery of the dominant mass. Tissue diagnosis is recommended. 2. No findings of pathologic adenopathy or distant metastatic spread. 3. 9 mm gallstone in the gallbladder. There is mild gallbladder wall thickening which may be from nondistention, correlate clinically in assessing for cholecystitis. 4.  Aortic Atherosclerosis (ICD10-I70.0). 5. Mild diffuse hepatic steatosis.   08/11/2019 Initial Biopsy   DIAGNOSIS: 08/11/19  A. LIVER, RIGHT, BIOPSY:  - Adenocarcinoma.   08/18/2019 Imaging   CT Chest 08/18/19  IMPRESSION: 1. Multiple pulmonary nodules largest at approximately 7 mm in the right lower lobe, nonspecific but concerning given findings in the liver. 2. No signs of definitive metastatic disease, also with mildly enlarged upper abdominal lymph nodes as discussed.   Aortic Atherosclerosis (ICD10-I70.0).   08/19/2019 Initial Diagnosis   Intrahepatic cholangiocarcinoma (HCC)   08/19/2019 Cancer Staging   Staging form: Intrahepatic Bile Duct, AJCC 8th Edition - Clinical stage from 08/19/2019: Stage II (cT2, cN0, cM0) - Signed by Malachy Mood, MD on 08/19/2019   09/29/2019 - 07/19/2020 Chemotherapy   Cisplatin and Gemcitabine 2 weeks on/1 week off starting 09/29/19. Cisplatin held from cycle 9 (03/23/20) due to fluid status/Afib. He is now on maintenance Gemcitabine. Held after 07/19/20 to proceed with liver target therapy.     10/09/2019 Imaging   CT AP IMPRESSION: 1. The dominant right hepatic lobe mass is minimally reduced in size compared to prior exams, currently measuring 6.2 by 5.3 cm, previously 6.2 by 5.6 cm. However, there is a new small hypodense lesion centrally in the right hepatic lobe which is suspicious for a new  small focus of tumor. Accordingly this is an overall mixed appearance. 2. Continued hypoenhancement in the liver downstream of the tumor likely attributable to narrowing or occlusion of the right hepatic vein by the tumor. By virtue of its location the tumor wraps around the intrahepatic portion of the IVC. 3. 4 mm right middle lobe pulmonary nodule, stable compared to earliest available comparison of 07/18/2019. Surveillance of the patient's pulmonary nodules is recommended. 4. Other imaging findings of potential clinical significance: Coronary atherosclerosis. Cholelithiasis. Prominent stool throughout the colon favors constipation. Moderate prostatomegaly with heterogeneous enhancement of the prostate gland. Lumbar spondylosis and degenerative disc disease causing mild bilateral foraminal impingement at L3-4 and L4-5.   Aortic Atherosclerosis (ICD10-I70.0).   12/24/2019 Imaging   CT CAP W Contrast  IMPRESSION: 1. Right hepatic lobe mass and adjacent right hepatic lobe nodules appear grossly stable. No evidence of distant metastatic disease. 2. Continued stability of small pulmonary nodules. Recommend attention on follow-up. 3. Cholelithiasis. 4. Enlarged prostate. 5. Aortic atherosclerosis (ICD10-I70.0). Coronary artery calcification.   02/23/2020 Procedure   He had PAC placed on 02/23/20.    04/08/2020 Imaging   CT CAP  IMPRESSION: 1. Stable or minimally decreased size of a very ill-defined, hypodense and somewhat retractile appearing mass of the central right lobe of the liver abutting the inferior vena cava and right portal vein, measuring approximately 5.7 x 5.0 cm, previously 6.2 x 5.2 cm when measured similarly. Findings are consistent with stable or minimally improved cholangiocarcinoma. 2. Small hypodense nodules of the right lobe identified on prior examination are poorly appreciated on this single phase contrast examination although not grossly changed. Attention on  follow-up.  3. There are new, moderate bilateral pleural effusions and associated atelectasis or consolidation as well as a new small pericardial effusion, nonspecific although generally concerning and suspicious for malignant effusions. There is no directly visualized pleural nodularity. 4. Multiple small pulmonary nodules are stable.  No new nodules. 5. Coronary artery disease. Aortic Atherosclerosis (ICD10-I70.0).   07/06/2020 Imaging   MRI ABD IMPRESSION: 1. Interval decrease in size of the right hepatic lobe lesion in the medial aspect of the segment 7. Findings would suggest a good response to treatment with some contraction of the tumor. 2. No new hepatic lesions. No abdominal adenopathy or metastatic disease. 3. Stable mild intrahepatic biliary dilatation in the right hepatic lobe distal to the lesion. 4. Somewhat tortuous and almost beaded appearance of the hepatic and portal vein radicles. Findings could be radiation related. 5. Cholelithiasis.  CT chest wo contrast IMPRESSION: 1. Persistent/stable moderate-sized bilateral pleural effusions with overlying atelectasis. 2. Stable small right pulmonary nodules. No new or progressive findings. Recommend continued surveillance. 3. No mediastinal or hilar mass or adenopathy. 4. Stable advanced three-vessel coronary artery calcifications. 5. Cholelithiasis. Aortic Atherosclerosis (ICD10-I70.0)   08/11/2020 Procedure   Y90 on 08/11/20 and 08/26/20 with Dr Archer Asa    11/30/2020 Imaging   MRI Abdomen  IMPRESSION: 1. Today's study demonstrates progression of disease with enlarging central lesion in the right lobe of the liver involving portions of segments 7 and 8, now with evidence of some tumor thrombus extension into the intrahepatic portion of the inferior vena cava. This is associated with increasing intrahepatic biliary ductal dilatation in segment 7 of the liver. 2. In addition, there is some subtle hyperenhancement of the  distal common bile duct at and immediately proximally to the level of the ampulla. There is also some subtle delayed enhancement around this region in the pancreatic head. This is of uncertain etiology and significance, but may be inflammatory and currently is not associated with proximal common bile duct dilatation. However, close attention on follow-up imaging is recommended. 3. Cholelithiasis without evidence of acute cholecystitis at this time. 4. Aortic atherosclerosis.     12/24/2020 - 03/21/2021 Chemotherapy   Restart Gemcitabine 2 weeks on/1 week off given disease progression beginning 12/24/20. Discontinued after 03/21/21 due to disease progression in liver.     03/16/2021 Imaging   MRI abdomen  IMPRESSION: No significant change in size of irregular hypovascular mass in the medial right hepatic lobe.   New 1.1 cm hypovascular lesion with peripheral rim enhancement in liver dome, suspicious for metastatic disease.   New small right pleural effusion and mild ascites.   Cholelithiasis, without evidence of cholecystitis or biliary dilatation.   04/05/2021 -  Chemotherapy   Second-line FOLFOX q2weeks starting 04/05/21    04/05/2021 -  Chemotherapy   Immunotherapy with Durvalumab (Imfinzi) q4weeks starting 04/05/21. (in addition to second line FOLFOX)   06/23/2021 Imaging   MRI Abdomen IMPRESSION: 1. No significant change in subcapsular mass of the posterior right lobe of the liver. 2. No significant change in an additional small hyperenhancing lesion of the liver dome, which remains suspicious for a satellite lesion. 3. No new liver lesions. 4. 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 5. Cholelithiasis without evidence of acute cholecystitis. 6. Trace ascites.  CT Chest w/o contrast IMPRESSION: 1. Occasional small pulmonary nodules in the right lung are stable, for example a 6 mm nodule right lower lobe and a 3 mm nodule of the lateral segment right middle  lobe. These are nonspecific and likely  benign, incidental sequelae of infection or inflammation, metastatic disease not favored. Attention on follow-up. 2. Previously seen bilateral pleural effusions are resolved. 3. Hypodense lesion of the posterior right lobe of the liver, keeping with known cholangiocarcinoma and better characterized by same day MR. 4. Coronary artery disease.   10/21/2021 Imaging   EXAM: MRI ABDOMEN WITHOUT AND WITH CONTRAST  IMPRESSION: 1. Mild progression of dominant right hepatic lobe mass with involvement of the right portal, hepatic veins and minimal extension into the IVC. 2. Hepatic dome satellite lesion, new or more conspicuous today. 3. Cholelithiasis 4. Decrease in trace perihepatic and perisplenic ascites. 5.  Aortic Atherosclerosis (ICD10-I70.0).   10/21/2021 Imaging   EXAM: CT CHEST WITHOUT CONTRAST  IMPRESSION: 1. Since 06/23/2021, enlargement of a right lower lobe and possible enlargement of a right middle lobe pulmonary nodule. Findings are suspicious for metastatic disease. 2. No thoracic adenopathy. 3. Coronary artery atherosclerosis. Aortic Atherosclerosis (ICD10-I70.0).   11/01/2021 - 01/11/2022 Chemotherapy   Patient is on Treatment Plan : PANCREAS FOLFIRI q14d     01/19/2022 Imaging   EXAM: CT CHEST WITHOUT CONTRAST  IMPRESSION: 1. Continued increase in size of lung nodule within the posterior right lung base which now measures 1 cm and is suspicious for metastatic disease. The remaining lung nodules are stable from 10/21/2021. 2. No thoracic adenopathy. 3. Aortic Atherosclerosis (ICD10-I70.0). Coronary artery calcifications.   01/19/2022 Imaging   EXAM: MRI ABDOMEN WITHOUT AND WITH CONTRAST  IMPRESSION: 1. No significant change in size or appearance of the large heterogeneous enhancing mass area in the right hepatic lobe as described. There is mild interval enlargement of the adjacent satellite lesion near the dome of the right  lobe now measuring 1.9 cm. 2. Redemonstration of a pulmonary nodule in the right lower lobe measuring 9 mm. 3. Small volume ascites. 4. Splenomegaly. 5. Cholelithiasis.   12/08/2022 - 12/08/2022 Chemotherapy   Patient is on Treatment Plan : UTERINE Lenvatinib (20) D1-21 + Pembrolizumab (200) D1 q21d        INTERVAL HISTORY:  Brandon Sherman is here for a follow up of cholangiocarcinoma . He was last seen by me on 02/22/2023. He presents to the clinic accompanied by wife. Pt state that he goes every week for IR Paracentesis.      All other systems were reviewed with the patient and are negative.  MEDICAL HISTORY:  Past Medical History:  Diagnosis Date   Arthritis    Diabetes (HCC)    GERD (gastroesophageal reflux disease)    Hyperlipidemia    Hypertension    Intrahepatic cholangiocarcinoma (HCC)     SURGICAL HISTORY: Past Surgical History:  Procedure Laterality Date   APPENDECTOMY  1980   CARDIOVERSION N/A 04/27/2020   Procedure: CARDIOVERSION;  Surgeon: Lars Masson, MD;  Location: [facility] ENDOSCOPY;  Service: Cardiovascular;  Laterality: N/A;   CARDIOVERSION N/A 05/19/2020   Procedure: CARDIOVERSION;  Surgeon: Parke Poisson, MD;  Location: [facility] ENDOSCOPY;  Service: Cardiovascular;  Laterality: N/A;   COLONOSCOPY     IR 3D INDEPENDENT WKST  08/11/2020   IR 3D INDEPENDENT WKST  08/11/2020   IR ANGIOGRAM SELECTIVE EACH ADDITIONAL VESSEL  08/11/2020   IR ANGIOGRAM SELECTIVE EACH ADDITIONAL VESSEL  08/11/2020   IR ANGIOGRAM SELECTIVE EACH ADDITIONAL VESSEL  08/11/2020   IR ANGIOGRAM SELECTIVE EACH ADDITIONAL VESSEL  08/11/2020   IR ANGIOGRAM SELECTIVE EACH ADDITIONAL VESSEL  08/11/2020   IR ANGIOGRAM SELECTIVE EACH ADDITIONAL VESSEL  08/11/2020   IR ANGIOGRAM SELECTIVE EACH ADDITIONAL VESSEL  08/26/2020   IR ANGIOGRAM SELECTIVE EACH ADDITIONAL VESSEL  08/26/2020   IR ANGIOGRAM VISCERAL SELECTIVE  08/11/2020   IR ANGIOGRAM VISCERAL SELECTIVE  08/26/2020   IR EMBO ARTERIAL NOT  HEMORR HEMANG INC GUIDE ROADMAPPING  08/11/2020   IR EMBO TUMOR ORGAN ISCHEMIA INFARCT INC GUIDE ROADMAPPING  08/26/2020   IR IMAGING GUIDED PORT INSERTION  02/23/2020   IR PARACENTESIS  01/05/2023   IR PARACENTESIS  01/18/2023   IR PARACENTESIS  01/25/2023   IR PARACENTESIS  02/01/2023   IR PARACENTESIS  02/08/2023   IR PARACENTESIS  02/15/2023   IR PARACENTESIS  02/23/2023   IR PARACENTESIS  03/01/2023   IR PARACENTESIS  03/08/2023   IR PARACENTESIS  03/15/2023   IR RADIOLOGIST EVAL & MGMT  07/14/2020   IR RADIOLOGIST EVAL & MGMT  09/30/2020   IR RADIOLOGIST EVAL & MGMT  12/01/2020   IR US GUIDE VASC ACCESS LEFT  08/26/2020   IR US GUIDE VASC ACCESS RIGHT  08/11/2020    I have reviewed the social history and family history with the patient and they are unchanged from previous note.  ALLERGIES:  has No Known Allergies.  MEDICATIONS:  Current Outpatient Medications  Medication Sig Dispense Refill   allopurinol (ZYLOPRIM) 100 MG tablet Take 100 mg by mouth daily.     apixaban (ELIQUIS) 5 MG TABS tablet TAKE 1 TABLET TWICE DAILY 180 tablet 1   atorvastatin (LIPITOR) 40 MG tablet Take 1 tablet (40 mg total) by mouth daily. 90 tablet 3   clindamycin (CLEOCIN) 300 MG capsule Take 1 capsule (300 mg total) by mouth 3 (three) times daily. 21 capsule 0   doxycycline (VIBRAMYCIN) 100 MG capsule Take 1 capsule (100 mg total) by mouth 2 (two) times daily. One po bid x 7 days 14 capsule 0   fenofibrate (TRICOR) 145 MG tablet Take 145 mg by mouth daily.     finasteride (PROSCAR) 5 MG tablet Take 5 mg by mouth daily.     furosemide (LASIX) 40 MG tablet TAKE 2 TABLETS BY MOUTH 2 TIMES DAILY. (Patient taking differently: Take 40 mg by mouth 3 (three) times a week. TAKE ONE TABLET (40 mg) MONDAY, WEDNESDAY, AND FRIDAY.) 360 tablet 3   gabapentin (NEURONTIN) 100 MG capsule Take 100 mg by mouth at bedtime.     hydrALAZINE (APRESOLINE) 50 MG tablet Take 1.5 tablets (75 mg total) by mouth 3 (three) times daily. 135 tablet  3   lidocaine-prilocaine (EMLA) cream Apply 1 Application topically as needed. 30 g 3   metFORMIN (GLUCOPHAGE-XR) 500 MG 24 hr tablet      metoprolol succinate (TOPROL-XL) 50 MG 24 hr tablet TAKE 1 TABLET BY MOUTH DAILY. TAKE WITH OR IMMEDIATELY FOLLOWING A MEAL. 90 tablet 2   olmesartan (BENICAR) 40 MG tablet Take 1 tablet (40 mg total) by mouth daily. 90 tablet 3   ondansetron (ZOFRAN) 8 MG tablet Take 1 tablet (8 mg total) by mouth every 8 (eight) hours as needed for nausea or vomiting. 30 tablet 1   pantoprazole (PROTONIX) 40 MG tablet TAKE 1 TABLET BY MOUTH TWICE A DAY 180 tablet 2   potassium chloride SA (KLOR-CON M20) 20 MEQ tablet Take 1 tablet (20 mEq total) by mouth daily. 7 tablet 0   sucralfate (CARAFATE) 1 g tablet Take 1 tablet (1 g total) by mouth every 6 (six) hours as needed. Please schedule a yearly follow up for further refills: [phone] 60 tablet 0   tamsulosin (FLOMAX) 0.4 MG CAPS capsule  Take 0.4 mg by mouth daily.     No current facility-administered medications for this visit.    PHYSICAL EXAMINATION: ECOG PERFORMANCE STATUS: 2 - Symptomatic, <50% confined to bed  Vitals:   03/21/23 1152  BP: 134/79  Pulse: 65  Resp: 18  Temp: 98.2 F (36.8 C)  SpO2: 98%   Wt Readings from Last 3 Encounters:  03/21/23 205 lb 8 oz (93.2 kg)  02/22/23 207 lb 1.6 oz (93.9 kg)  01/22/23 219 lb 9.6 oz (99.6 kg)     GENERAL:alert, no distress and comfortable SKIN: skin color normal, no rashes or significant lesions EYES: normal, Conjunctiva are pink and non-injected, sclera clear  NEURO: alert & oriented x 3 with fluent speech GENERAL:alert, no distress and comfortable SKIN: skin color, texture, turgor are normal, no rashes or significant lesions EYES: normal, Conjunctiva are pink and non-injected, sclera clear  LABORATORY DATA:  I have reviewed the data as listed    Latest Ref Rng & Units 03/21/2023   11:05 AM 02/22/2023   10:24 AM 01/29/2023    1:01 PM  CBC  WBC 4.0  - 10.5 K/uL 5.9  6.4  7.0   Hemoglobin 13.0 - 17.0 g/dL 16.1  09.6  04.5   Hematocrit 39.0 - 52.0 % 35.3  33.4  37.7   Platelets 150 - 400 K/uL 123  115  145         Latest Ref Rng & Units 03/21/2023   11:05 AM 02/22/2023   10:24 AM 01/29/2023    2:30 PM  CMP  Glucose 70 - 99 mg/dL 409  811  914   BUN 8 - 23 mg/dL 30  26  37   Creatinine 0.61 - 1.24 mg/dL 7.82  9.56  2.13   Sodium 135 - 145 mmol/L 136  134  125   Potassium 3.5 - 5.1 mmol/L 4.2  4.2  4.0   Chloride 98 - 111 mmol/L 108  106  95   CO2 22 - 32 mmol/L 24  22  24    Calcium 8.9 - 10.3 mg/dL 8.8  9.1  8.1   Total Protein 6.5 - 8.1 g/dL 6.1  6.4  7.0   Total Bilirubin 0.3 - 1.2 mg/dL 0.8  1.0  1.0   Alkaline Phos 38 - 126 U/L 55  62  105   AST 15 - 41 U/L 41  46  85   ALT 0 - 44 U/L 28  30  40       RADIOGRAPHIC STUDIES: I have personally reviewed the radiological images as listed and agreed with the findings in the report. No results found.    No orders of the defined types were placed in this encounter.  All questions were answered. The patient knows to call the clinic with any problems, questions or concerns. No barriers to learning was detected. The total time spent in the appointment was 20 minutes.     Malachy Mood, MD 03/21/2023   Carolin Coy, CMA, am acting as scribe for Malachy Mood, MD.   I have reviewed the above documentation for accuracy and completeness, and I agree with the above.

## 2023-03-21 ENCOUNTER — Inpatient Hospital Stay: Payer: Medicare HMO | Attending: Hematology

## 2023-03-21 ENCOUNTER — Encounter: Payer: Self-pay | Admitting: Hematology

## 2023-03-21 ENCOUNTER — Telehealth: Payer: Self-pay | Admitting: Cardiovascular Disease

## 2023-03-21 ENCOUNTER — Inpatient Hospital Stay (HOSPITAL_BASED_OUTPATIENT_CLINIC_OR_DEPARTMENT_OTHER): Payer: Medicare HMO | Admitting: Hematology

## 2023-03-21 ENCOUNTER — Other Ambulatory Visit: Payer: Self-pay

## 2023-03-21 VITALS — BP 134/79 | HR 65 | Temp 98.2°F | Resp 18 | Ht 69.0 in | Wt 205.5 lb

## 2023-03-21 DIAGNOSIS — C221 Intrahepatic bile duct carcinoma: Secondary | ICD-10-CM | POA: Insufficient documentation

## 2023-03-21 DIAGNOSIS — Z79899 Other long term (current) drug therapy: Secondary | ICD-10-CM | POA: Insufficient documentation

## 2023-03-21 DIAGNOSIS — I5031 Acute diastolic (congestive) heart failure: Secondary | ICD-10-CM

## 2023-03-21 DIAGNOSIS — E860 Dehydration: Secondary | ICD-10-CM

## 2023-03-21 DIAGNOSIS — Z66 Do not resuscitate: Secondary | ICD-10-CM | POA: Insufficient documentation

## 2023-03-21 DIAGNOSIS — Z7189 Other specified counseling: Secondary | ICD-10-CM | POA: Diagnosis not present

## 2023-03-21 DIAGNOSIS — Z95828 Presence of other vascular implants and grafts: Secondary | ICD-10-CM

## 2023-03-21 LAB — CBC WITH DIFFERENTIAL/PLATELET
Abs Immature Granulocytes: 0.02 10*3/uL (ref 0.00–0.07)
Basophils Absolute: 0 10*3/uL (ref 0.0–0.1)
Basophils Relative: 1 %
Eosinophils Absolute: 0.1 10*3/uL (ref 0.0–0.5)
Eosinophils Relative: 1 %
HCT: 35.3 % — ABNORMAL LOW (ref 39.0–52.0)
Hemoglobin: 11.8 g/dL — ABNORMAL LOW (ref 13.0–17.0)
Immature Granulocytes: 0 %
Lymphocytes Relative: 13 %
Lymphs Abs: 0.7 10*3/uL (ref 0.7–4.0)
MCH: 31.8 pg (ref 26.0–34.0)
MCHC: 33.4 g/dL (ref 30.0–36.0)
MCV: 95.1 fL (ref 80.0–100.0)
Monocytes Absolute: 0.6 10*3/uL (ref 0.1–1.0)
Monocytes Relative: 11 %
Neutro Abs: 4.4 10*3/uL (ref 1.7–7.7)
Neutrophils Relative %: 74 %
Platelets: 123 10*3/uL — ABNORMAL LOW (ref 150–400)
RBC: 3.71 MIL/uL — ABNORMAL LOW (ref 4.22–5.81)
RDW: 16.5 % — ABNORMAL HIGH (ref 11.5–15.5)
WBC: 5.9 10*3/uL (ref 4.0–10.5)
nRBC: 0 % (ref 0.0–0.2)

## 2023-03-21 LAB — COMPREHENSIVE METABOLIC PANEL
ALT: 28 U/L (ref 0–44)
AST: 41 U/L (ref 15–41)
Albumin: 3.6 g/dL (ref 3.5–5.0)
Alkaline Phosphatase: 55 U/L (ref 38–126)
Anion gap: 4 — ABNORMAL LOW (ref 5–15)
BUN: 30 mg/dL — ABNORMAL HIGH (ref 8–23)
CO2: 24 mmol/L (ref 22–32)
Calcium: 8.8 mg/dL — ABNORMAL LOW (ref 8.9–10.3)
Chloride: 108 mmol/L (ref 98–111)
Creatinine, Ser: 1.24 mg/dL (ref 0.61–1.24)
GFR, Estimated: >60 mL/min (ref >60)
Glucose, Bld: 108 mg/dL — ABNORMAL HIGH (ref 70–99)
Potassium: 4.2 mmol/L (ref 3.5–5.1)
Sodium: 136 mmol/L (ref 135–145)
Total Bilirubin: 0.8 mg/dL (ref 0.3–1.2)
Total Protein: 6.1 g/dL — ABNORMAL LOW (ref 6.5–8.1)

## 2023-03-21 MED ORDER — LIDOCAINE-PRILOCAINE 2.5-2.5 % EX CREA
1.0000 | TOPICAL_CREAM | CUTANEOUS | 3 refills | Status: DC | PRN
Start: 2023-03-21 — End: 2023-09-05

## 2023-03-21 MED ORDER — HEPARIN SOD (PORK) LOCK FLUSH 100 UNIT/ML IV SOLN
500.0000 [IU] | Freq: Once | INTRAVENOUS | Status: AC
  Administered 2023-03-21: 500 [IU]

## 2023-03-21 MED ORDER — SODIUM CHLORIDE 0.9% FLUSH
10.0000 mL | Freq: Once | INTRAVENOUS | Status: AC
  Administered 2023-03-21: 10 mL

## 2023-03-21 NOTE — Assessment & Plan Note (Signed)
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 -s/p first line Cisplatin and Gemcitabine, 09/29/19 - 07/19/20. Cisplatin discontinued 03/23/20 due to Afib. -he progressed through single agent gemcitabine (12/24/20 - 03/16/21), FOLFOX plus durvalumab (04/05/21 - 10/21/21), FOLFIRI (11/01/21 - 01/19/22), and then tried ivosidenib (02/02/22 - 04/2022) -he was switched to Stivarga on 05/15/22. He tolerated low-dose 80 mg daily well, but developed multiple side effects on 120 mg daily. He is tolerating 80mg  for 3 weeks on, 1 week off with no noticeable side effects. -due to disease progression, treatment changed to Lenvatinib and Keytruda recently  -treatment has been held since last week due to very poor performance status, anorexia, and new abdominal ascites -His nausea and low appetite have resolved now, he has recovered well from recent treatment. -There is no other standard treatment available.  Due to his limited performance status, I recommend palliative care and observation for now -we reviewed home palliative care and hospice again today, he is not ready and denied it -he has increased ascites and required frequent large volume paracentesis lately  -continue supportive care, he again declines a palliative care or hospice home care

## 2023-03-21 NOTE — Telephone Encounter (Signed)
Pt is requesting a c/b from Dr. Allyson Sabal or his RN. Pt wouldn't give further details for why c/b is needed.

## 2023-03-21 NOTE — Assessment & Plan Note (Signed)
-  We previously discussed the incurable nature of his cancer, and the overall poor prognosis, especially if he does not have good response to chemotherapy or progress on chemo -The patient understands the goal of care is palliative. -I recommended DNR/DNI and he agreed in 01/2022.   

## 2023-03-21 NOTE — Telephone Encounter (Signed)
Per wife, the patient's [place] doctor, Dr Mosetta Putt, states he would like to see if patient Lasix can be increased due to the fluid.  He is having paracentesis 1X each week.  I did not see in OV note for today from their office, but does have a lot of information.  Patient currently taking 40 mg Lasix Monday Wednesday and Friday.

## 2023-03-22 ENCOUNTER — Other Ambulatory Visit: Payer: Self-pay | Admitting: Cardiovascular Disease

## 2023-03-22 ENCOUNTER — Ambulatory Visit (HOSPITAL_COMMUNITY)
Admission: RE | Admit: 2023-03-22 | Discharge: 2023-03-22 | Disposition: A | Discharge status: Home / Self Care | Payer: Medicare HMO | Source: Ambulatory Visit | Attending: Hematology | Admitting: Hematology

## 2023-03-22 ENCOUNTER — Telehealth: Payer: Self-pay | Admitting: Cardiovascular Disease

## 2023-03-22 DIAGNOSIS — I4811 Longstanding persistent atrial fibrillation: Secondary | ICD-10-CM

## 2023-03-22 DIAGNOSIS — C221 Intrahepatic bile duct carcinoma: Secondary | ICD-10-CM | POA: Insufficient documentation

## 2023-03-22 DIAGNOSIS — R188 Other ascites: Secondary | ICD-10-CM | POA: Diagnosis not present

## 2023-03-22 HISTORY — PX: IR PARACENTESIS: IMG2679

## 2023-03-22 MED ORDER — ALBUMIN HUMAN 25 % IV SOLN
50.0000 g | Freq: Once | INTRAVENOUS | Status: AC
  Administered 2023-03-22: 50 g via INTRAVENOUS

## 2023-03-22 MED ORDER — LIDOCAINE HCL 1 % IJ SOLN
INTRAMUSCULAR | Status: AC
  Filled 2023-03-22: qty 20

## 2023-03-22 MED ORDER — HEPARIN SOD (PORK) LOCK FLUSH 100 UNIT/ML IV SOLN
500.0000 [IU] | Freq: Once | INTRAVENOUS | Status: AC
  Administered 2023-03-22: 500 [IU]

## 2023-03-22 MED ORDER — HEPARIN SOD (PORK) LOCK FLUSH 100 UNIT/ML IV SOLN
INTRAVENOUS | Status: AC
  Filled 2023-03-22: qty 5

## 2023-03-22 MED ORDER — ALBUMIN HUMAN 25 % IV SOLN
INTRAVENOUS | Status: AC
  Filled 2023-03-22: qty 200

## 2023-03-22 NOTE — Telephone Encounter (Signed)
Multiple encounters. Please see previous encounter

## 2023-03-22 NOTE — Procedures (Signed)
PROCEDURE SUMMARY:  Successful US guided paracentesis from right lateral abdomen.  Yielded 13.5 liters of clear yellow fluid.  No immediate complications.  Patient tolerated well.  EBL = trace  Nachman Sundt S Shaneal Barasch PA-C 03/22/2023 11:40 AM

## 2023-03-22 NOTE — Telephone Encounter (Signed)
Patient's spouse is returning call. 

## 2023-03-22 NOTE — Telephone Encounter (Signed)
Prescription refill request for Eliquis received. Indication: afib  Last office visit: Allyson Sabal 11/29/2022 Scr: 1.24, 03/21/2023 Age: 77 yo  Weight: 93.2 kg   Refill sent.

## 2023-03-22 NOTE — Telephone Encounter (Signed)
Wife aware of provider message  Apparently his Oncologist changed his lasix to 2X/wk. I have no problem going back to daily dosing with close follow up of BMET   Wife stated he gets blood work every six weeks from oncologist. He just had blood work done yesterday so it will be six weeks before new labs. Would you like for him to have BMET before six weeks since going back to lasix 40mg  1 tab daily?

## 2023-03-22 NOTE — Telephone Encounter (Signed)
Left voicemail to return call to office.

## 2023-03-23 ENCOUNTER — Telehealth: Payer: Self-pay | Admitting: Cardiovascular Disease

## 2023-03-23 NOTE — Telephone Encounter (Signed)
Left voicemail for patient to return call to office. 

## 2023-03-23 NOTE — Telephone Encounter (Signed)
Wife aware patient will need BMET in 10 days. Verbalized understamding

## 2023-03-23 NOTE — Telephone Encounter (Signed)
Patient's wife is returning LPN's call. Unable to add to previous encounters regarding this due to them being signed. Please advise.

## 2023-03-23 NOTE — Telephone Encounter (Signed)
Multiple encounters. Please see previous encounter 

## 2023-03-23 NOTE — Addendum Note (Signed)
Addended by: Serita Sheller R on: 03/23/2023 01:13 PM   Modules accepted: Orders

## 2023-03-29 ENCOUNTER — Ambulatory Visit (HOSPITAL_COMMUNITY)
Admission: RE | Admit: 2023-03-29 | Discharge: 2023-03-29 | Disposition: A | Discharge status: Home / Self Care | Payer: Medicare HMO | Source: Ambulatory Visit | Attending: Hematology | Admitting: Hematology

## 2023-03-29 DIAGNOSIS — C221 Intrahepatic bile duct carcinoma: Secondary | ICD-10-CM | POA: Diagnosis not present

## 2023-03-29 DIAGNOSIS — R221 Localized swelling, mass and lump, neck: Secondary | ICD-10-CM | POA: Diagnosis not present

## 2023-03-29 DIAGNOSIS — R188 Other ascites: Secondary | ICD-10-CM | POA: Insufficient documentation

## 2023-03-29 HISTORY — PX: IR PARACENTESIS: IMG2679

## 2023-03-29 MED ORDER — ALBUMIN HUMAN 25 % IV SOLN
INTRAVENOUS | Status: AC
  Filled 2023-03-29: qty 200

## 2023-03-29 MED ORDER — ALBUMIN HUMAN 25 % IV SOLN: 75.0000 g | Freq: Once | INTRAVENOUS | Status: DC

## 2023-03-29 MED ORDER — ALBUMIN HUMAN 25 % IV SOLN
INTRAVENOUS | Status: AC
  Filled 2023-03-29: qty 100

## 2023-03-29 MED ORDER — HEPARIN SOD (PORK) LOCK FLUSH 100 UNIT/ML IV SOLN
INTRAVENOUS | Status: AC
  Filled 2023-03-29: qty 5

## 2023-03-29 MED ORDER — LIDOCAINE HCL 1 % IJ SOLN
20.0000 mL | Freq: Once | INTRAMUSCULAR | Status: AC
  Administered 2023-03-29: 10 mL via INTRADERMAL

## 2023-03-29 MED ORDER — LIDOCAINE HCL 1 % IJ SOLN
INTRAMUSCULAR | Status: AC
  Filled 2023-03-29: qty 20

## 2023-03-29 NOTE — Procedures (Signed)
PROCEDURE SUMMARY:  Successful US guided paracentesis from right lateral abdomen.  Yielded 14.0 liters of clear, yellow fluid.  No immediate complications.  Pt tolerated well.   Specimen was not sent for labs.  EBL < 5mL  Hoyt Koch PA-C 03/29/2023 2:00 PM

## 2023-04-05 ENCOUNTER — Other Ambulatory Visit: Payer: Self-pay | Admitting: *Deleted

## 2023-04-05 ENCOUNTER — Ambulatory Visit (HOSPITAL_COMMUNITY)
Admission: RE | Admit: 2023-04-05 | Discharge: 2023-04-05 | Disposition: A | Discharge status: Home / Self Care | Payer: Medicare HMO | Source: Ambulatory Visit | Attending: Hematology | Admitting: Hematology

## 2023-04-05 DIAGNOSIS — R188 Other ascites: Secondary | ICD-10-CM | POA: Diagnosis not present

## 2023-04-05 DIAGNOSIS — I5031 Acute diastolic (congestive) heart failure: Secondary | ICD-10-CM | POA: Diagnosis not present

## 2023-04-05 DIAGNOSIS — R180 Malignant ascites: Secondary | ICD-10-CM | POA: Insufficient documentation

## 2023-04-05 DIAGNOSIS — C221 Intrahepatic bile duct carcinoma: Secondary | ICD-10-CM | POA: Diagnosis not present

## 2023-04-05 HISTORY — PX: IR PARACENTESIS: IMG2679

## 2023-04-05 MED ORDER — HEPARIN SOD (PORK) LOCK FLUSH 100 UNIT/ML IV SOLN
INTRAVENOUS | Status: AC
  Filled 2023-04-05: qty 5

## 2023-04-05 MED ORDER — ALBUMIN HUMAN 25 % IV SOLN
INTRAVENOUS | Status: AC
  Filled 2023-04-05: qty 300

## 2023-04-05 MED ORDER — ALBUMIN HUMAN 25 % IV SOLN
75.0000 g | Freq: Once | INTRAVENOUS | Status: AC
  Administered 2023-04-05: 75 g via INTRAVENOUS

## 2023-04-05 NOTE — Procedures (Signed)
PROCEDURE SUMMARY:  Successful US guided paracentesis from left lateral abdomen.  Yielded 14 liters of clear yellow fluid.  No immediate complications.  Patient tolerated well.  EBL = trace The patient has previously been evaluated by the [facility], and deemed not a candidate for intervention.   Rayola Everhart S Arantxa Piercey PA-C 04/05/2023 12:03 PM

## 2023-04-06 LAB — BASIC METABOLIC PANEL
BUN/Creatinine Ratio: 19 (ref 10–24)
BUN: 29 mg/dL — ABNORMAL HIGH (ref 8–27)
CO2: 22 mmol/L (ref 20–29)
Calcium: 9.5 mg/dL (ref 8.6–10.2)
Chloride: 102 mmol/L (ref 96–106)
Creatinine, Ser: 1.53 mg/dL — ABNORMAL HIGH (ref 0.76–1.27)
Glucose: 86 mg/dL (ref 70–99)
Potassium: 5.1 mmol/L (ref 3.5–5.2)
Sodium: 138 mmol/L (ref 134–144)
eGFR: 47 mL/min/{1.73_m2} — ABNORMAL LOW (ref >59)

## 2023-04-12 ENCOUNTER — Ambulatory Visit (HOSPITAL_COMMUNITY)
Admission: RE | Admit: 2023-04-12 | Discharge: 2023-04-12 | Disposition: A | Discharge status: Home / Self Care | Payer: Medicare HMO | Source: Ambulatory Visit | Attending: Hematology | Admitting: Hematology

## 2023-04-12 DIAGNOSIS — C220 Liver cell carcinoma: Secondary | ICD-10-CM | POA: Diagnosis not present

## 2023-04-12 DIAGNOSIS — R180 Malignant ascites: Secondary | ICD-10-CM | POA: Insufficient documentation

## 2023-04-12 DIAGNOSIS — C221 Intrahepatic bile duct carcinoma: Secondary | ICD-10-CM | POA: Diagnosis not present

## 2023-04-12 DIAGNOSIS — R188 Other ascites: Secondary | ICD-10-CM | POA: Diagnosis not present

## 2023-04-12 HISTORY — PX: IR PARACENTESIS: IMG2679

## 2023-04-12 MED ORDER — HEPARIN SOD (PORK) LOCK FLUSH 100 UNIT/ML IV SOLN
INTRAVENOUS | Status: AC
  Filled 2023-04-12: qty 5

## 2023-04-12 MED ORDER — LIDOCAINE HCL 1 % IJ SOLN
INTRAMUSCULAR | Status: AC
  Filled 2023-04-12: qty 20

## 2023-04-12 MED ORDER — ALBUMIN HUMAN 25 % IV SOLN
INTRAVENOUS | Status: AC
  Filled 2023-04-12: qty 300

## 2023-04-12 MED ORDER — ALBUMIN HUMAN 25 % IV SOLN
75.0000 g | Freq: Once | INTRAVENOUS | Status: AC
  Administered 2023-04-12: 75 g via INTRAVENOUS

## 2023-04-12 NOTE — Progress Notes (Signed)
IV consult placed for port access. Upon arrival to IR, RN told access was no longer needed.

## 2023-04-19 ENCOUNTER — Ambulatory Visit (HOSPITAL_COMMUNITY)
Admission: RE | Admit: 2023-04-19 | Discharge: 2023-04-19 | Disposition: A | Discharge status: Home / Self Care | Payer: Medicare HMO | Source: Ambulatory Visit | Attending: Hematology | Admitting: Hematology

## 2023-04-19 DIAGNOSIS — C221 Intrahepatic bile duct carcinoma: Secondary | ICD-10-CM | POA: Diagnosis not present

## 2023-04-19 DIAGNOSIS — R188 Other ascites: Secondary | ICD-10-CM | POA: Diagnosis not present

## 2023-04-19 HISTORY — PX: IR PARACENTESIS: IMG2679

## 2023-04-19 MED ORDER — ALBUMIN HUMAN 25 % IV SOLN
75.0000 g | Freq: Once | INTRAVENOUS | Status: AC
  Administered 2023-04-19: 75 g via INTRAVENOUS
  Filled 2023-04-19: qty 300

## 2023-04-19 MED ORDER — LIDOCAINE HCL 1 % IJ SOLN
INTRAMUSCULAR | Status: AC
  Filled 2023-04-19: qty 20

## 2023-04-19 MED ORDER — HEPARIN SOD (PORK) LOCK FLUSH 100 UNIT/ML IV SOLN
INTRAVENOUS | Status: AC
  Filled 2023-04-19: qty 5

## 2023-04-19 MED ORDER — ALBUMIN HUMAN 25 % IV SOLN
INTRAVENOUS | Status: AC
  Filled 2023-04-19: qty 300

## 2023-04-19 NOTE — Procedures (Signed)
PROCEDURE SUMMARY:  Successful US guided paracentesis from right lateral abdomen.  Yielded 12.0 L of yellow, clear fluid.  No immediate complications.  Pt tolerated well.   Specimen was not sent for labs.  EBL < 5mL  Hoyt Koch PA-C 04/19/2023 9:49 AM

## 2023-04-26 ENCOUNTER — Other Ambulatory Visit: Payer: Self-pay | Admitting: Hematology

## 2023-04-27 ENCOUNTER — Ambulatory Visit (HOSPITAL_COMMUNITY)
Admission: RE | Admit: 2023-04-27 | Discharge: 2023-04-27 | Disposition: A | Discharge status: Home / Self Care | Payer: Medicare HMO | Source: Ambulatory Visit | Attending: Hematology | Admitting: Hematology

## 2023-04-27 DIAGNOSIS — R188 Other ascites: Secondary | ICD-10-CM | POA: Insufficient documentation

## 2023-04-27 DIAGNOSIS — C221 Intrahepatic bile duct carcinoma: Secondary | ICD-10-CM | POA: Diagnosis not present

## 2023-04-27 HISTORY — PX: IR PARACENTESIS: IMG2679

## 2023-04-27 MED ORDER — HEPARIN SOD (PORK) LOCK FLUSH 100 UNIT/ML IV SOLN
INTRAVENOUS | Status: AC
  Filled 2023-04-27: qty 5

## 2023-04-27 MED ORDER — ALBUMIN HUMAN 25 % IV SOLN
INTRAVENOUS | Status: AC
  Filled 2023-04-27: qty 100

## 2023-04-27 MED ORDER — ALBUMIN HUMAN 25 % IV SOLN
INTRAVENOUS | Status: AC
  Filled 2023-04-27: qty 200

## 2023-04-27 MED ORDER — ALBUMIN HUMAN 25 % IV SOLN: 50.0000 g | Freq: Once | INTRAVENOUS | Status: DC

## 2023-04-27 MED ORDER — LIDOCAINE HCL 1 % IJ SOLN
20.0000 mL | Freq: Once | INTRAMUSCULAR | Status: AC
  Administered 2023-04-27: 10 mL via INTRADERMAL

## 2023-04-27 MED ORDER — LIDOCAINE HCL 1 % IJ SOLN
INTRAMUSCULAR | Status: AC
  Filled 2023-04-27: qty 20

## 2023-04-27 NOTE — Procedures (Signed)
PROCEDURE SUMMARY:  Successful US guided therapeutic paracentesis from RLQ.  Yielded 10.2 L of clear, yellow fluid.  No immediate complications.  Pt tolerated well.   Specimen not sent for labs.  EBL < 1 mL  Shon Hough, AGNP 04/27/2023 11:51 AM

## 2023-05-01 NOTE — Progress Notes (Unsigned)
[facility]   Telephone:(336) [phone] Fax:(336) [phone]   Clinic Follow up Note   Patient Care Team: Doran Stabler, NP as PCP - General (Adult Health Nurse Practitioner) Runell Gess, MD as PCP - Cardiology (Cardiology) Almond Lint, MD as Consulting Physician (General Surgery) [name], Willaim Rayas, MD as Consulting Physician (Gastroenterology) Malachy Mood, MD as Consulting Physician (Hematology) Runell Gess, MD as Consulting Physician (Cardiology)  Date of Service:  05/02/2023  CHIEF COMPLAINT: f/u of cholangiocarcinoma      CURRENT THERAPY:  Supportive care   ASSESSMENT:  Brandon Sherman is a 77 y.o. male with   Intrahepatic cholangiocarcinoma (HCC) 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 -s/p first line Cisplatin and Gemcitabine, 09/29/19 - 07/19/20. Cisplatin discontinued 03/23/20 due to Afib. -he progressed through single agent gemcitabine (12/24/20 - 03/16/21), FOLFOX plus durvalumab (04/05/21 - 10/21/21), FOLFIRI (11/01/21 - 01/19/22), and then tried ivosidenib (02/02/22 - 04/2022) -he was switched to Stivarga on 05/15/22. He tolerated low-dose 80 mg daily well, but developed multiple side effects on 120 mg daily. He is tolerating 80mg  for 3 weeks on, 1 week off with no noticeable side effects. -due to disease progression, treatment changed to Lenvatinib and Keytruda recently  -treatment has been held since last week due to very poor performance status, anorexia, and new abdominal ascites -His nausea and low appetite have resolved now, he has recovered well from recent treatment. -There is no other standard treatment available.  Due to his limited performance status, I recommend palliative care and observation for now -we reviewed home palliative care and hospice again today, he is not ready and denied it -he has increased ascites and  required frequent large volume paracentesis lately  -continue supportive care, he is not ready for hospice home care  Goals of care, counseling/discussion -We previously discussed the incurable nature of his cancer, and the overall poor prognosis, especially if he does not have good response to chemotherapy or progress on chemo -The patient understands the goal of care is palliative. -I recommended DNR/DNI and he agreed in 01/2022         PLAN: -lab reviewed -I renew order for IR Paracentesis, no volume limitation, he needs albumin infusion each time  -encourage the pt the eat more line meat and a high calorie diet and to stay hydrated -lab and f/u in 6 weeks  SUMMARY OF ONCOLOGIC HISTORY: Oncology History Overview Note  Cancer Staging Intrahepatic cholangiocarcinoma (HCC) Staging form: Intrahepatic Bile Duct, AJCC 8th Edition - Clinical stage from 08/19/2019: Stage II (cT2, cN0, cM0) - Signed by Malachy Mood, MD on 08/19/2019    Intrahepatic cholangiocarcinoma (HCC)  06/28/2019 Imaging   CT AP W Contrast 06/28/19  IMPRESSION: 1. Heterogeneous hypodensity posteriorly in the right hepatic lobe and potentially extending into the caudate lobe suspicious for a mass. There is felt to be truncation of branches of the portal vein in this vicinity and some narrowing of the hepatic vein, as well as triangular-shaped regions of abnormal hypoenhancement posteriorly in the right hepatic lobe likely representing downstream vascular effects. Cannot exclude malignancy such as cholangiocarcinoma or hepatocellular carcinoma, and follow up hepatic protocol MRI with and without contrast is recommended to further characterize. 2. 4 mm right middle lobe pulmonary nodule is likely benign but may merit surveillance. 3. Cholelithiasis. 4.  Aortic Atherosclerosis (ICD10-I70.0). 5. Prostatomegaly. 6. Mild impingement at L3-4 and L4-5.  07/31/2019 Imaging   MRI Liver 07/31/19 IMPRESSION: 1. 7.3 cm in long  axis mass in the right hepatic lobe spanning into the caudate lobe, high suspicion for malignancy such as hepatocellular carcinoma or cholangiocarcinoma. Suspected effacement or occlusion of the right hepatic vein and posterior branches of the right portal vein. Two smaller tumor nodules along the posterior periphery of the dominant mass. Tissue diagnosis is recommended. 2. No findings of pathologic adenopathy or distant metastatic spread. 3. 9 mm gallstone in the gallbladder. There is mild gallbladder wall thickening which may be from nondistention, correlate clinically in assessing for cholecystitis. 4.  Aortic Atherosclerosis (ICD10-I70.0). 5. Mild diffuse hepatic steatosis.   08/11/2019 Initial Biopsy   DIAGNOSIS: 08/11/19  A. LIVER, RIGHT, BIOPSY:  - Adenocarcinoma.   08/18/2019 Imaging   CT Chest 08/18/19  IMPRESSION: 1. Multiple pulmonary nodules largest at approximately 7 mm in the right lower lobe, nonspecific but concerning given findings in the liver. 2. No signs of definitive metastatic disease, also with mildly enlarged upper abdominal lymph nodes as discussed.   Aortic Atherosclerosis (ICD10-I70.0).   08/19/2019 Initial Diagnosis   Intrahepatic cholangiocarcinoma (HCC)   08/19/2019 Cancer Staging   Staging form: Intrahepatic Bile Duct, AJCC 8th Edition - Clinical stage from 08/19/2019: Stage II (cT2, cN0, cM0) - Signed by Malachy Mood, MD on 08/19/2019   09/29/2019 - 07/19/2020 Chemotherapy   Cisplatin and Gemcitabine 2 weeks on/1 week off starting 09/29/19. Cisplatin held from cycle 9 (03/23/20) due to fluid status/Afib. He is now on maintenance Gemcitabine. Held after 07/19/20 to proceed with liver target therapy.     10/09/2019 Imaging   CT AP IMPRESSION: 1. The dominant right hepatic lobe mass is minimally reduced in size compared to prior exams, currently measuring 6.2 by 5.3 cm, previously 6.2 by 5.6 cm. However, there is a new small hypodense lesion centrally in the  right hepatic lobe which is suspicious for a new small focus of tumor. Accordingly this is an overall mixed appearance. 2. Continued hypoenhancement in the liver downstream of the tumor likely attributable to narrowing or occlusion of the right hepatic vein by the tumor. By virtue of its location the tumor wraps around the intrahepatic portion of the IVC. 3. 4 mm right middle lobe pulmonary nodule, stable compared to earliest available comparison of 07/18/2019. Surveillance of the patient's pulmonary nodules is recommended. 4. Other imaging findings of potential clinical significance: Coronary atherosclerosis. Cholelithiasis. Prominent stool throughout the colon favors constipation. Moderate prostatomegaly with heterogeneous enhancement of the prostate gland. Lumbar spondylosis and degenerative disc disease causing mild bilateral foraminal impingement at L3-4 and L4-5.   Aortic Atherosclerosis (ICD10-I70.0).   12/24/2019 Imaging   CT CAP W Contrast  IMPRESSION: 1. Right hepatic lobe mass and adjacent right hepatic lobe nodules appear grossly stable. No evidence of distant metastatic disease. 2. Continued stability of small pulmonary nodules. Recommend attention on follow-up. 3. Cholelithiasis. 4. Enlarged prostate. 5. Aortic atherosclerosis (ICD10-I70.0). Coronary artery calcification.   02/23/2020 Procedure   He had PAC placed on 02/23/20.    04/08/2020 Imaging   CT CAP  IMPRESSION: 1. Stable or minimally decreased size of a very ill-defined, hypodense and somewhat retractile appearing mass of the central right lobe of the liver abutting the inferior vena cava and right portal vein, measuring approximately 5.7 x 5.0 cm, previously 6.2 x 5.2 cm when measured similarly. Findings are consistent with stable or minimally improved cholangiocarcinoma. 2. Small hypodense nodules of the right lobe identified on prior examination are poorly appreciated  on this single phase contrast examination  although not grossly changed. Attention on follow-up. 3. There are new, moderate bilateral pleural effusions and associated atelectasis or consolidation as well as a new small pericardial effusion, nonspecific although generally concerning and suspicious for malignant effusions. There is no directly visualized pleural nodularity. 4. Multiple small pulmonary nodules are stable.  No new nodules. 5. Coronary artery disease. Aortic Atherosclerosis (ICD10-I70.0).   07/06/2020 Imaging   MRI ABD IMPRESSION: 1. Interval decrease in size of the right hepatic lobe lesion in the medial aspect of the segment 7. Findings would suggest a good response to treatment with some contraction of the tumor. 2. No new hepatic lesions. No abdominal adenopathy or metastatic disease. 3. Stable mild intrahepatic biliary dilatation in the right hepatic lobe distal to the lesion. 4. Somewhat tortuous and almost beaded appearance of the hepatic and portal vein radicles. Findings could be radiation related. 5. Cholelithiasis.  CT chest wo contrast IMPRESSION: 1. Persistent/stable moderate-sized bilateral pleural effusions with overlying atelectasis. 2. Stable small right pulmonary nodules. No new or progressive findings. Recommend continued surveillance. 3. No mediastinal or hilar mass or adenopathy. 4. Stable advanced three-vessel coronary artery calcifications. 5. Cholelithiasis. Aortic Atherosclerosis (ICD10-I70.0)   08/11/2020 Procedure   Y90 on 08/11/20 and 08/26/20 with Dr Archer Asa    11/30/2020 Imaging   MRI Abdomen  IMPRESSION: 1. Today's study demonstrates progression of disease with enlarging central lesion in the right lobe of the liver involving portions of segments 7 and 8, now with evidence of some tumor thrombus extension into the intrahepatic portion of the inferior vena cava. This is associated with increasing intrahepatic biliary ductal dilatation in segment 7 of the liver. 2. In addition,  there is some subtle hyperenhancement of the distal common bile duct at and immediately proximally to the level of the ampulla. There is also some subtle delayed enhancement around this region in the pancreatic head. This is of uncertain etiology and significance, but may be inflammatory and currently is not associated with proximal common bile duct dilatation. However, close attention on follow-up imaging is recommended. 3. Cholelithiasis without evidence of acute cholecystitis at this time. 4. Aortic atherosclerosis.     12/24/2020 - 03/21/2021 Chemotherapy   Restart Gemcitabine 2 weeks on/1 week off given disease progression beginning 12/24/20. Discontinued after 03/21/21 due to disease progression in liver.     03/16/2021 Imaging   MRI abdomen  IMPRESSION: No significant change in size of irregular hypovascular mass in the medial right hepatic lobe.   New 1.1 cm hypovascular lesion with peripheral rim enhancement in liver dome, suspicious for metastatic disease.   New small right pleural effusion and mild ascites.   Cholelithiasis, without evidence of cholecystitis or biliary dilatation.   04/05/2021 -  Chemotherapy   Second-line FOLFOX q2weeks starting 04/05/21    04/05/2021 -  Chemotherapy   Immunotherapy with Durvalumab (Imfinzi) q4weeks starting 04/05/21. (in addition to second line FOLFOX)   06/23/2021 Imaging   MRI Abdomen IMPRESSION: 1. No significant change in subcapsular mass of the posterior right lobe of the liver. 2. No significant change in an additional small hyperenhancing lesion of the liver dome, which remains suspicious for a satellite lesion. 3. No new liver lesions. 4. 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 5. Cholelithiasis without evidence of acute cholecystitis. 6. Trace ascites.  CT Chest w/o contrast IMPRESSION: 1. Occasional small pulmonary nodules in the right lung are stable, for example a 6 mm nodule right lower lobe and a 3 mm  nodule of the lateral segment right middle lobe. These are nonspecific and likely benign, incidental sequelae of infection or inflammation, metastatic disease not favored. Attention on follow-up. 2. Previously seen bilateral pleural effusions are resolved. 3. Hypodense lesion of the posterior right lobe of the liver, keeping with known cholangiocarcinoma and better characterized by same day MR. 4. Coronary artery disease.   10/21/2021 Imaging   EXAM: MRI ABDOMEN WITHOUT AND WITH CONTRAST  IMPRESSION: 1. Mild progression of dominant right hepatic lobe mass with involvement of the right portal, hepatic veins and minimal extension into the IVC. 2. Hepatic dome satellite lesion, new or more conspicuous today. 3. Cholelithiasis 4. Decrease in trace perihepatic and perisplenic ascites. 5.  Aortic Atherosclerosis (ICD10-I70.0).   10/21/2021 Imaging   EXAM: CT CHEST WITHOUT CONTRAST  IMPRESSION: 1. Since 06/23/2021, enlargement of a right lower lobe and possible enlargement of a right middle lobe pulmonary nodule. Findings are suspicious for metastatic disease. 2. No thoracic adenopathy. 3. Coronary artery atherosclerosis. Aortic Atherosclerosis (ICD10-I70.0).   11/01/2021 - 01/11/2022 Chemotherapy   Patient is on Treatment Plan : PANCREAS FOLFIRI q14d     01/19/2022 Imaging   EXAM: CT CHEST WITHOUT CONTRAST  IMPRESSION: 1. Continued increase in size of lung nodule within the posterior right lung base which now measures 1 cm and is suspicious for metastatic disease. The remaining lung nodules are stable from 10/21/2021. 2. No thoracic adenopathy. 3. Aortic Atherosclerosis (ICD10-I70.0). Coronary artery calcifications.   01/19/2022 Imaging   EXAM: MRI ABDOMEN WITHOUT AND WITH CONTRAST  IMPRESSION: 1. No significant change in size or appearance of the large heterogeneous enhancing mass area in the right hepatic lobe as described. There is mild interval enlargement of the  adjacent satellite lesion near the dome of the right lobe now measuring 1.9 cm. 2. Redemonstration of a pulmonary nodule in the right lower lobe measuring 9 mm. 3. Small volume ascites. 4. Splenomegaly. 5. Cholelithiasis.   12/08/2022 - 12/08/2022 Chemotherapy   Patient is on Treatment Plan : UTERINE Lenvatinib (20) D1-21 + Pembrolizumab (200) D1 q21d        INTERVAL HISTORY:  Brandon Sherman is here for a follow up of cholangiocarcinoma . He was last seen by me on 02/22/2023. He presents to the clinic accompanied by wife. Pt state that he is getting paracentesis once a week now. Pt state that his appetite is very good. He denies having any other issues.      All other systems were reviewed with the patient and are negative.  MEDICAL HISTORY:  Past Medical History:  Diagnosis Date   Arthritis    Diabetes (HCC)    GERD (gastroesophageal reflux disease)    Hyperlipidemia    Hypertension    Intrahepatic cholangiocarcinoma (HCC)     SURGICAL HISTORY: Past Surgical History:  Procedure Laterality Date   APPENDECTOMY  1980   CARDIOVERSION N/A 04/27/2020   Procedure: CARDIOVERSION;  Surgeon: Lars Masson, MD;  Location: [facility] ENDOSCOPY;  Service: Cardiovascular;  Laterality: N/A;   CARDIOVERSION N/A 05/19/2020   Procedure: CARDIOVERSION;  Surgeon: Parke Poisson, MD;  Location: [facility] ENDOSCOPY;  Service: Cardiovascular;  Laterality: N/A;   COLONOSCOPY     IR 3D INDEPENDENT WKST  08/11/2020   IR 3D INDEPENDENT WKST  08/11/2020   IR ANGIOGRAM SELECTIVE EACH ADDITIONAL VESSEL  08/11/2020   IR ANGIOGRAM SELECTIVE EACH ADDITIONAL VESSEL  08/11/2020   IR ANGIOGRAM SELECTIVE EACH ADDITIONAL VESSEL  08/11/2020   IR ANGIOGRAM SELECTIVE EACH ADDITIONAL VESSEL  08/11/2020  IR ANGIOGRAM SELECTIVE EACH ADDITIONAL VESSEL  08/11/2020   IR ANGIOGRAM SELECTIVE EACH ADDITIONAL VESSEL  08/11/2020   IR ANGIOGRAM SELECTIVE EACH ADDITIONAL VESSEL  08/26/2020   IR ANGIOGRAM SELECTIVE EACH ADDITIONAL  VESSEL  08/26/2020   IR ANGIOGRAM VISCERAL SELECTIVE  08/11/2020   IR ANGIOGRAM VISCERAL SELECTIVE  08/26/2020   IR EMBO ARTERIAL NOT HEMORR HEMANG INC GUIDE ROADMAPPING  08/11/2020   IR EMBO TUMOR ORGAN ISCHEMIA INFARCT INC GUIDE ROADMAPPING  08/26/2020   IR IMAGING GUIDED PORT INSERTION  02/23/2020   IR PARACENTESIS  01/05/2023   IR PARACENTESIS  01/18/2023   IR PARACENTESIS  01/25/2023   IR PARACENTESIS  02/01/2023   IR PARACENTESIS  02/08/2023   IR PARACENTESIS  02/15/2023   IR PARACENTESIS  02/23/2023   IR PARACENTESIS  03/01/2023   IR PARACENTESIS  03/08/2023   IR PARACENTESIS  03/15/2023   IR PARACENTESIS  03/22/2023   IR PARACENTESIS  03/29/2023   IR PARACENTESIS  04/05/2023   IR PARACENTESIS  04/12/2023   IR PARACENTESIS  04/19/2023   IR PARACENTESIS  04/27/2023   IR RADIOLOGIST EVAL & MGMT  07/14/2020   IR RADIOLOGIST EVAL & MGMT  09/30/2020   IR RADIOLOGIST EVAL & MGMT  12/01/2020   IR US GUIDE VASC ACCESS LEFT  08/26/2020   IR US GUIDE VASC ACCESS RIGHT  08/11/2020    I have reviewed the social history and family history with the patient and they are unchanged from previous note.  ALLERGIES:  has No Known Allergies.  MEDICATIONS:  Current Outpatient Medications  Medication Sig Dispense Refill   allopurinol (ZYLOPRIM) 100 MG tablet Take 100 mg by mouth daily.     apixaban (ELIQUIS) 5 MG TABS tablet TAKE 1 TABLET TWICE DAILY 180 tablet 1   atorvastatin (LIPITOR) 40 MG tablet Take 1 tablet (40 mg total) by mouth daily. 90 tablet 3   clindamycin (CLEOCIN) 300 MG capsule Take 1 capsule (300 mg total) by mouth 3 (three) times daily. 21 capsule 0   doxycycline (VIBRAMYCIN) 100 MG capsule Take 1 capsule (100 mg total) by mouth 2 (two) times daily. One po bid x 7 days 14 capsule 0   fenofibrate (TRICOR) 145 MG tablet Take 145 mg by mouth daily.     finasteride (PROSCAR) 5 MG tablet Take 5 mg by mouth daily.     furosemide (LASIX) 40 MG tablet TAKE 2 TABLETS BY MOUTH 2 TIMES DAILY. (Patient taking  differently: Take 40 mg by mouth 3 (three) times a week. TAKE ONE TABLET (40 mg) MONDAY, WEDNESDAY, AND FRIDAY.) 360 tablet 3   gabapentin (NEURONTIN) 100 MG capsule Take 100 mg by mouth at bedtime.     hydrALAZINE (APRESOLINE) 50 MG tablet Take 1.5 tablets (75 mg total) by mouth 3 (three) times daily. 135 tablet 3   lidocaine-prilocaine (EMLA) cream Apply 1 Application topically as needed. 30 g 3   metFORMIN (GLUCOPHAGE-XR) 500 MG 24 hr tablet      metoprolol succinate (TOPROL-XL) 50 MG 24 hr tablet TAKE 1 TABLET BY MOUTH DAILY. TAKE WITH OR IMMEDIATELY FOLLOWING A MEAL. 90 tablet 2   olmesartan (BENICAR) 40 MG tablet Take 1 tablet (40 mg total) by mouth daily. 90 tablet 3   ondansetron (ZOFRAN) 8 MG tablet Take 1 tablet (8 mg total) by mouth every 8 (eight) hours as needed for nausea or vomiting. 30 tablet 1   pantoprazole (PROTONIX) 40 MG tablet TAKE 1 TABLET BY MOUTH TWICE A DAY 180 tablet 2  potassium chloride SA (KLOR-CON M20) 20 MEQ tablet Take 1 tablet (20 mEq total) by mouth daily. 7 tablet 0   sucralfate (CARAFATE) 1 g tablet Take 1 tablet (1 g total) by mouth every 6 (six) hours as needed. Please schedule a yearly follow up for further refills: [phone] 60 tablet 0   tamsulosin (FLOMAX) 0.4 MG CAPS capsule Take 0.4 mg by mouth daily.     No current facility-administered medications for this visit.    PHYSICAL EXAMINATION: ECOG PERFORMANCE STATUS: 2 - Symptomatic, <50% confined to bed  Vitals:   05/02/23 0958  BP: 131/75  Pulse: 82  Resp: 18  Temp: 97.8 F (36.6 C)  SpO2: 100%   Wt Readings from Last 3 Encounters:  05/02/23 187 lb 14.4 oz (85.2 kg)  03/21/23 205 lb 8 oz (93.2 kg)  02/22/23 207 lb 1.6 oz (93.9 kg)     GENERAL:alert, no distress and comfortable SKIN: skin color normal, no rashes or significant lesions EYES: normal, Conjunctiva are pink and non-injected, sclera clear  NEURO: alert & oriented x 3 with fluent speech HEART: (-)regular rate & rhythm and  no murmurs and no lower extremity edema ABDOMEN:(-)abdomen soft, (-)non-tender and normal bowel sounds  LABORATORY DATA:  I have reviewed the data as listed    Latest Ref Rng & Units 05/02/2023    9:46 AM 03/21/2023   11:05 AM 02/22/2023   10:24 AM  CBC  WBC 4.0 - 10.5 K/uL 6.1  5.9  6.4   Hemoglobin 13.0 - 17.0 g/dL 09.8  11.9  14.7   Hematocrit 39.0 - 52.0 % 36.8  35.3  33.4   Platelets 150 - 400 K/uL 108  123  115         Latest Ref Rng & Units 04/05/2023    2:50 PM 03/21/2023   11:05 AM 02/22/2023   10:24 AM  CMP  Glucose 70 - 99 mg/dL 86  829  562   BUN 8 - 27 mg/dL 29  30  26    Creatinine 0.76 - 1.27 mg/dL 1.30  8.65  7.84   Sodium 134 - 144 mmol/L 138  136  134   Potassium 3.5 - 5.2 mmol/L 5.1  4.2  4.2   Chloride 96 - 106 mmol/L 102  108  106   CO2 20 - 29 mmol/L 22  24  22    Calcium 8.6 - 10.2 mg/dL 9.5  8.8  9.1   Total Protein 6.5 - 8.1 g/dL  6.1  6.4   Total Bilirubin 0.3 - 1.2 mg/dL  0.8  1.0   Alkaline Phos 38 - 126 U/L  55  62   AST 15 - 41 U/L  41  46   ALT 0 - 44 U/L  28  30       RADIOGRAPHIC STUDIES: I have personally reviewed the radiological images as listed and agreed with the findings in the report. No results found.    No orders of the defined types were placed in this encounter.  All questions were answered. The patient knows to call the clinic with any problems, questions or concerns. No barriers to learning was detected. The total time spent in the appointment was 20 minutes.     Malachy Mood, MD 05/02/2023   Carolin Coy, CMA, am acting as scribe for Malachy Mood, MD.   I have reviewed the above documentation for accuracy and completeness, and I agree with the above.

## 2023-05-01 NOTE — Assessment & Plan Note (Signed)
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 -s/p first line Cisplatin and Gemcitabine, 09/29/19 - 07/19/20. Cisplatin discontinued 03/23/20 due to Afib. -he progressed through single agent gemcitabine (12/24/20 - 03/16/21), FOLFOX plus durvalumab (04/05/21 - 10/21/21), FOLFIRI (11/01/21 - 01/19/22), and then tried ivosidenib (02/02/22 - 04/2022) -he was switched to Stivarga on 05/15/22. He tolerated low-dose 80 mg daily well, but developed multiple side effects on 120 mg daily. He is tolerating 80mg  for 3 weeks on, 1 week off with no noticeable side effects. -due to disease progression, treatment changed to Lenvatinib and Keytruda recently  -treatment has been held since last week due to very poor performance status, anorexia, and new abdominal ascites -His nausea and low appetite have resolved now, he has recovered well from recent treatment. -There is no other standard treatment available.  Due to his limited performance status, I recommend palliative care and observation for now -we reviewed home palliative care and hospice again today, he is not ready and denied it -he has increased ascites and required frequent large volume paracentesis lately  -continue supportive care, he is not ready for hospice home care

## 2023-05-01 NOTE — Assessment & Plan Note (Signed)
-  We previously discussed the incurable nature of his cancer, and the overall poor prognosis, especially if he does not have good response to chemotherapy or progress on chemo -The patient understands the goal of care is palliative. -I recommended DNR/DNI and he agreed in 01/2022.   

## 2023-05-02 ENCOUNTER — Inpatient Hospital Stay (HOSPITAL_BASED_OUTPATIENT_CLINIC_OR_DEPARTMENT_OTHER): Payer: Medicare HMO | Admitting: Hematology

## 2023-05-02 ENCOUNTER — Other Ambulatory Visit: Payer: Self-pay

## 2023-05-02 ENCOUNTER — Encounter: Payer: Self-pay | Admitting: Hematology

## 2023-05-02 ENCOUNTER — Inpatient Hospital Stay: Payer: Medicare HMO | Attending: Hematology

## 2023-05-02 VITALS — BP 131/75 | HR 82 | Temp 97.8°F | Resp 18 | Ht 69.0 in | Wt 187.9 lb

## 2023-05-02 DIAGNOSIS — Z95828 Presence of other vascular implants and grafts: Secondary | ICD-10-CM

## 2023-05-02 DIAGNOSIS — Z79899 Other long term (current) drug therapy: Secondary | ICD-10-CM | POA: Insufficient documentation

## 2023-05-02 DIAGNOSIS — R140 Abdominal distension (gaseous): Secondary | ICD-10-CM

## 2023-05-02 DIAGNOSIS — Z7189 Other specified counseling: Secondary | ICD-10-CM

## 2023-05-02 DIAGNOSIS — R0609 Other forms of dyspnea: Secondary | ICD-10-CM

## 2023-05-02 DIAGNOSIS — E860 Dehydration: Secondary | ICD-10-CM

## 2023-05-02 DIAGNOSIS — C221 Intrahepatic bile duct carcinoma: Secondary | ICD-10-CM

## 2023-05-02 DIAGNOSIS — R918 Other nonspecific abnormal finding of lung field: Secondary | ICD-10-CM | POA: Diagnosis not present

## 2023-05-02 LAB — COMPREHENSIVE METABOLIC PANEL
ALT: 28 U/L (ref 0–44)
AST: 44 U/L — ABNORMAL HIGH (ref 15–41)
Albumin: 3.5 g/dL (ref 3.5–5.0)
Alkaline Phosphatase: 43 U/L (ref 38–126)
Anion gap: 7 (ref 5–15)
BUN: 34 mg/dL — ABNORMAL HIGH (ref 8–23)
CO2: 23 mmol/L (ref 22–32)
Calcium: 9.3 mg/dL (ref 8.9–10.3)
Chloride: 107 mmol/L (ref 98–111)
Creatinine, Ser: 1.62 mg/dL — ABNORMAL HIGH (ref 0.61–1.24)
GFR, Estimated: 44 mL/min — ABNORMAL LOW (ref >60)
Glucose, Bld: 103 mg/dL — ABNORMAL HIGH (ref 70–99)
Potassium: 4.3 mmol/L (ref 3.5–5.1)
Sodium: 137 mmol/L (ref 135–145)
Total Bilirubin: 1.1 mg/dL (ref 0.3–1.2)
Total Protein: 6.4 g/dL — ABNORMAL LOW (ref 6.5–8.1)

## 2023-05-02 LAB — CBC WITH DIFFERENTIAL/PLATELET
Abs Immature Granulocytes: 0.01 10*3/uL (ref 0.00–0.07)
Basophils Absolute: 0 10*3/uL (ref 0.0–0.1)
Basophils Relative: 1 %
Eosinophils Absolute: 0.1 10*3/uL (ref 0.0–0.5)
Eosinophils Relative: 1 %
HCT: 36.8 % — ABNORMAL LOW (ref 39.0–52.0)
Hemoglobin: 12.3 g/dL — ABNORMAL LOW (ref 13.0–17.0)
Immature Granulocytes: 0 %
Lymphocytes Relative: 12 %
Lymphs Abs: 0.7 10*3/uL (ref 0.7–4.0)
MCH: 31.1 pg (ref 26.0–34.0)
MCHC: 33.4 g/dL (ref 30.0–36.0)
MCV: 93.2 fL (ref 80.0–100.0)
Monocytes Absolute: 0.5 10*3/uL (ref 0.1–1.0)
Monocytes Relative: 8 %
Neutro Abs: 4.8 10*3/uL (ref 1.7–7.7)
Neutrophils Relative %: 78 %
Platelets: 108 10*3/uL — ABNORMAL LOW (ref 150–400)
RBC: 3.95 MIL/uL — ABNORMAL LOW (ref 4.22–5.81)
RDW: 15.9 % — ABNORMAL HIGH (ref 11.5–15.5)
WBC: 6.1 10*3/uL (ref 4.0–10.5)
nRBC: 0 % (ref 0.0–0.2)

## 2023-05-02 MED ORDER — HEPARIN SOD (PORK) LOCK FLUSH 100 UNIT/ML IV SOLN
500.0000 [IU] | Freq: Once | INTRAVENOUS | Status: AC
  Administered 2023-05-02: 500 [IU]

## 2023-05-02 MED ORDER — SODIUM CHLORIDE 0.9% FLUSH
10.0000 mL | Freq: Once | INTRAVENOUS | Status: AC
  Administered 2023-05-02: 10 mL

## 2023-05-03 ENCOUNTER — Ambulatory Visit (HOSPITAL_COMMUNITY)
Admission: RE | Admit: 2023-05-03 | Discharge: 2023-05-03 | Disposition: A | Discharge status: Home / Self Care | Payer: Medicare HMO | Source: Ambulatory Visit | Attending: Hematology | Admitting: Hematology

## 2023-05-03 DIAGNOSIS — R188 Other ascites: Secondary | ICD-10-CM | POA: Insufficient documentation

## 2023-05-03 DIAGNOSIS — C221 Intrahepatic bile duct carcinoma: Secondary | ICD-10-CM | POA: Insufficient documentation

## 2023-05-03 HISTORY — PX: IR PARACENTESIS: IMG2679

## 2023-05-03 MED ORDER — ALBUMIN HUMAN 25 % IV SOLN
50.0000 g | Freq: Once | INTRAVENOUS | Status: AC
  Administered 2023-05-03: 50 g via INTRAVENOUS

## 2023-05-03 MED ORDER — ALBUMIN HUMAN 25 % IV SOLN
INTRAVENOUS | Status: AC
  Filled 2023-05-03: qty 300

## 2023-05-03 MED ORDER — HEPARIN SOD (PORK) LOCK FLUSH 100 UNIT/ML IV SOLN
500.0000 [IU] | Freq: Once | INTRAVENOUS | Status: AC
  Administered 2023-05-03: 500 [IU] via INTRAVENOUS

## 2023-05-03 MED ORDER — ALBUMIN HUMAN 25 % IV SOLN: 60.0000 g | Freq: Once | INTRAVENOUS | Status: DC

## 2023-05-03 MED ORDER — HEPARIN SOD (PORK) LOCK FLUSH 100 UNIT/ML IV SOLN
INTRAVENOUS | Status: AC
  Filled 2023-05-03: qty 5

## 2023-05-03 MED ORDER — LIDOCAINE HCL 1 % IJ SOLN
INTRAMUSCULAR | Status: AC
  Filled 2023-05-03: qty 20

## 2023-05-03 MED ORDER — ALBUMIN HUMAN 25 % IV SOLN: 75.0000 g | Freq: Once | INTRAVENOUS | Status: DC

## 2023-05-03 MED ORDER — ALBUMIN HUMAN 25 % IV SOLN: 12.5000 g | Freq: Once | INTRAVENOUS | Status: DC

## 2023-05-03 NOTE — Procedures (Signed)
PROCEDURE SUMMARY:  Successful image-guided paracentesis from the right lower abdomen.  Yielded 11 liters of hazy yellow fluid.  No immediate complications.  EBL = trace. Patient tolerated well.   Specimen was not sent for labs.  Please see imaging section of Epic for full dictation.   Jamilah Jean H Mihir Flanigan PA-C 05/03/2023 10:30 AM

## 2023-05-10 ENCOUNTER — Ambulatory Visit (HOSPITAL_COMMUNITY)
Admission: RE | Admit: 2023-05-10 | Discharge: 2023-05-10 | Disposition: A | Discharge status: Home / Self Care | Payer: Medicare HMO | Source: Ambulatory Visit | Attending: Hematology | Admitting: Hematology

## 2023-05-10 DIAGNOSIS — R188 Other ascites: Secondary | ICD-10-CM | POA: Diagnosis not present

## 2023-05-10 DIAGNOSIS — C221 Intrahepatic bile duct carcinoma: Secondary | ICD-10-CM | POA: Diagnosis not present

## 2023-05-10 HISTORY — PX: IR PARACENTESIS: IMG2679

## 2023-05-10 MED ORDER — LIDOCAINE HCL 1 % IJ SOLN
20.0000 mL | Freq: Once | INTRAMUSCULAR | Status: AC
  Administered 2023-05-10: 2 mL via INTRADERMAL

## 2023-05-10 MED ORDER — ALBUMIN HUMAN 25 % IV SOLN
INTRAVENOUS | Status: AC
  Filled 2023-05-10: qty 100

## 2023-05-10 MED ORDER — HEPARIN SOD (PORK) LOCK FLUSH 100 UNIT/ML IV SOLN
INTRAVENOUS | Status: AC
  Filled 2023-05-10: qty 5

## 2023-05-10 MED ORDER — LIDOCAINE HCL 1 % IJ SOLN
INTRAMUSCULAR | Status: AC
  Filled 2023-05-10: qty 20

## 2023-05-10 MED ORDER — HEPARIN SOD (PORK) LOCK FLUSH 100 UNIT/ML IV SOLN
500.0000 [IU] | Freq: Once | INTRAVENOUS | Status: AC
  Administered 2023-05-10: 500 [IU] via INTRAVENOUS

## 2023-05-10 NOTE — Procedures (Signed)
PROCEDURE SUMMARY:  Successful US guided paracentesis from right lower quadrant.  Yielded 11.2 L of clear yellow fluid.  No immediate complications.  Pt tolerated well.   Specimen not sent for labs.  EBL < 2 mL  Mickie Kay, NP 05/10/2023 2:03 PM

## 2023-05-17 ENCOUNTER — Ambulatory Visit (HOSPITAL_COMMUNITY)
Admission: RE | Admit: 2023-05-17 | Discharge: 2023-05-17 | Disposition: A | Discharge status: Home / Self Care | Payer: Medicare HMO | Source: Ambulatory Visit | Attending: Hematology | Admitting: Hematology

## 2023-05-17 DIAGNOSIS — R188 Other ascites: Secondary | ICD-10-CM | POA: Insufficient documentation

## 2023-05-17 DIAGNOSIS — R140 Abdominal distension (gaseous): Secondary | ICD-10-CM | POA: Insufficient documentation

## 2023-05-17 DIAGNOSIS — C221 Intrahepatic bile duct carcinoma: Secondary | ICD-10-CM | POA: Diagnosis not present

## 2023-05-17 DIAGNOSIS — R0609 Other forms of dyspnea: Secondary | ICD-10-CM | POA: Insufficient documentation

## 2023-05-17 HISTORY — PX: IR PARACENTESIS: IMG2679

## 2023-05-17 MED ORDER — HEPARIN SOD (PORK) LOCK FLUSH 100 UNIT/ML IV SOLN
INTRAVENOUS | Status: AC
  Filled 2023-05-17: qty 5

## 2023-05-17 MED ORDER — ALBUMIN HUMAN 25 % IV SOLN
INTRAVENOUS | Status: AC
  Filled 2023-05-17: qty 200

## 2023-05-17 MED ORDER — ALBUMIN HUMAN 25 % IV SOLN
50.0000 g | Freq: Once | INTRAVENOUS | Status: AC
  Administered 2023-05-17: 50 g via INTRAVENOUS

## 2023-05-17 MED ORDER — ALBUMIN HUMAN 25 % IV SOLN: 12.5000 g | Freq: Once | INTRAVENOUS | Status: DC

## 2023-05-17 MED ORDER — LIDOCAINE HCL 1 % IJ SOLN
INTRAMUSCULAR | Status: AC
  Filled 2023-05-17: qty 20

## 2023-05-17 NOTE — Procedures (Signed)
PROCEDURE SUMMARY:  Successful US guided paracentesis from right lower abdomen.  Yielded 12.3 L of clear yellow fluid.  No immediate complications.  Pt tolerated well.   Specimen not sent for labs.  EBL < 2 mL  Mickie Kay, NP 05/17/2023 11:10 AM

## 2023-05-25 ENCOUNTER — Ambulatory Visit (HOSPITAL_COMMUNITY)
Admission: RE | Admit: 2023-05-25 | Discharge: 2023-05-25 | Disposition: A | Discharge status: Home / Self Care | Payer: Medicare HMO | Source: Ambulatory Visit | Attending: Hematology | Admitting: Hematology

## 2023-05-25 DIAGNOSIS — R188 Other ascites: Secondary | ICD-10-CM | POA: Diagnosis not present

## 2023-05-25 DIAGNOSIS — C221 Intrahepatic bile duct carcinoma: Secondary | ICD-10-CM | POA: Diagnosis not present

## 2023-05-25 HISTORY — PX: IR PARACENTESIS: IMG2679

## 2023-05-25 MED ORDER — ALBUMIN HUMAN 25 % IV SOLN
INTRAVENOUS | Status: AC
  Filled 2023-05-25: qty 100

## 2023-05-25 MED ORDER — ALBUMIN HUMAN 25 % IV SOLN
INTRAVENOUS | Status: AC
  Filled 2023-05-25: qty 200

## 2023-05-25 MED ORDER — ALBUMIN HUMAN 25 % IV SOLN
25.0000 g | Freq: Once | INTRAVENOUS | Status: AC
  Administered 2023-05-25: 25 g via INTRAVENOUS

## 2023-05-25 MED ORDER — HEPARIN SOD (PORK) LOCK FLUSH 100 UNIT/ML IV SOLN
500.0000 [IU] | INTRAVENOUS | Status: AC | PRN
  Administered 2023-05-25: 500 [IU]

## 2023-05-25 MED ORDER — LIDOCAINE HCL 1 % IJ SOLN
INTRAMUSCULAR | Status: AC
  Filled 2023-05-25: qty 20

## 2023-05-25 MED ORDER — ALBUMIN HUMAN 25 % IV SOLN
75.0000 g | Freq: Once | INTRAVENOUS | Status: AC
  Administered 2023-05-25: 75 g via INTRAVENOUS

## 2023-05-25 NOTE — Procedures (Signed)
PROCEDURE SUMMARY:  Successful image-guided paracentesis from the right lower abdomen.  Yielded 13.5 liters of clear yellow fluid.  No immediate complications.  EBL = trace. Patient tolerated well.   Specimen was not sent for labs.  Please see imaging section of Epic for full dictation.   Kennieth Francois PA-C 05/25/2023 10:50 AM

## 2023-05-31 ENCOUNTER — Ambulatory Visit (HOSPITAL_COMMUNITY)
Admission: RE | Admit: 2023-05-31 | Discharge: 2023-05-31 | Disposition: A | Discharge status: Home / Self Care | Payer: Medicare HMO | Source: Ambulatory Visit | Attending: Hematology | Admitting: Hematology

## 2023-05-31 DIAGNOSIS — R140 Abdominal distension (gaseous): Secondary | ICD-10-CM | POA: Insufficient documentation

## 2023-05-31 DIAGNOSIS — R0609 Other forms of dyspnea: Secondary | ICD-10-CM | POA: Insufficient documentation

## 2023-05-31 DIAGNOSIS — R180 Malignant ascites: Secondary | ICD-10-CM | POA: Insufficient documentation

## 2023-05-31 DIAGNOSIS — C221 Intrahepatic bile duct carcinoma: Secondary | ICD-10-CM | POA: Diagnosis not present

## 2023-05-31 DIAGNOSIS — R188 Other ascites: Secondary | ICD-10-CM | POA: Diagnosis not present

## 2023-05-31 HISTORY — PX: IR PARACENTESIS: IMG2679

## 2023-05-31 MED ORDER — ALBUMIN HUMAN 25 % IV SOLN
INTRAVENOUS | Status: AC
  Filled 2023-05-31: qty 300

## 2023-05-31 MED ORDER — ALBUMIN HUMAN 25 % IV SOLN: 75.0000 g | Freq: Once | INTRAVENOUS | Status: DC

## 2023-05-31 MED ORDER — HEPARIN SOD (PORK) LOCK FLUSH 100 UNIT/ML IV SOLN
INTRAVENOUS | Status: AC
  Filled 2023-05-31: qty 5

## 2023-05-31 MED ORDER — HEPARIN SOD (PORK) LOCK FLUSH 100 UNIT/ML IV SOLN
500.0000 [IU] | Freq: Once | INTRAVENOUS | Status: AC
  Administered 2023-05-31: 500 [IU] via INTRAVENOUS

## 2023-05-31 MED ORDER — LIDOCAINE HCL 1 % IJ SOLN
10.0000 mL | Freq: Once | INTRAMUSCULAR | Status: AC
  Administered 2023-05-31: 10 mL

## 2023-05-31 MED ORDER — LIDOCAINE HCL 1 % IJ SOLN
INTRAMUSCULAR | Status: AC
  Filled 2023-05-31: qty 20

## 2023-05-31 MED ORDER — ALBUMIN HUMAN 25 % IV SOLN
50.0000 g | Freq: Once | INTRAVENOUS | Status: AC
  Administered 2023-05-31: 50 g via INTRAVENOUS

## 2023-05-31 NOTE — Procedures (Signed)
PROCEDURE SUMMARY:  Successful image-guided paracentesis from the right lower abdomen.  Yielded 10.9 liters of clear yellow fluid.  No immediate complications.  EBL = trace. Patient tolerated well.   Specimen was not sent for labs.  Please see imaging section of Epic for full dictation.   Kennieth Francois PA-C 05/31/2023 10:49 AM

## 2023-06-07 ENCOUNTER — Ambulatory Visit (HOSPITAL_COMMUNITY)
Admission: RE | Admit: 2023-06-07 | Discharge: 2023-06-07 | Disposition: A | Discharge status: Home / Self Care | Payer: Medicare HMO | Source: Ambulatory Visit | Attending: Hematology | Admitting: Hematology

## 2023-06-07 DIAGNOSIS — C221 Intrahepatic bile duct carcinoma: Secondary | ICD-10-CM | POA: Diagnosis not present

## 2023-06-07 DIAGNOSIS — R188 Other ascites: Secondary | ICD-10-CM | POA: Insufficient documentation

## 2023-06-07 HISTORY — PX: IR PARACENTESIS: IMG2679

## 2023-06-07 MED ORDER — LIDOCAINE HCL 1 % IJ SOLN
INTRAMUSCULAR | Status: AC
  Filled 2023-06-07: qty 20

## 2023-06-07 MED ORDER — HEPARIN SOD (PORK) LOCK FLUSH 100 UNIT/ML IV SOLN
INTRAVENOUS | Status: AC
  Filled 2023-06-07: qty 5

## 2023-06-07 MED ORDER — ALBUMIN HUMAN 25 % IV SOLN
INTRAVENOUS | Status: AC
  Filled 2023-06-07: qty 100

## 2023-06-07 MED ORDER — ALBUMIN HUMAN 25 % IV SOLN
INTRAVENOUS | Status: AC
  Filled 2023-06-07: qty 200

## 2023-06-07 MED ORDER — ALBUMIN HUMAN 25 % IV SOLN
75.0000 g | Freq: Once | INTRAVENOUS | Status: AC
  Administered 2023-06-07: 75 g via INTRAVENOUS

## 2023-06-07 NOTE — Procedures (Signed)
PROCEDURE SUMMARY:  Successful image-guided paracentesis from the right upper abdomen.  Yielded 11.4 liters of opaque yellow fluid.  No immediate complications.  EBL = trace. Patient tolerated well.   Specimen was sent for labs.  Please see imaging section of Epic for full dictation.   Kennieth Francois PA-C 06/07/2023 11:01 AM

## 2023-06-14 ENCOUNTER — Ambulatory Visit (HOSPITAL_COMMUNITY)
Admission: RE | Admit: 2023-06-14 | Discharge: 2023-06-14 | Disposition: A | Discharge status: Home / Self Care | Payer: Medicare HMO | Source: Ambulatory Visit | Attending: Hematology | Admitting: Hematology

## 2023-06-14 DIAGNOSIS — R188 Other ascites: Secondary | ICD-10-CM | POA: Diagnosis not present

## 2023-06-14 DIAGNOSIS — C221 Intrahepatic bile duct carcinoma: Secondary | ICD-10-CM | POA: Diagnosis not present

## 2023-06-14 HISTORY — PX: IR PARACENTESIS: IMG2679

## 2023-06-14 MED ORDER — ALBUMIN HUMAN 25 % IV SOLN
62.5000 g | Freq: Once | INTRAVENOUS | Status: AC
  Administered 2023-06-14: 62.5 g via INTRAVENOUS

## 2023-06-14 MED ORDER — HEPARIN SOD (PORK) LOCK FLUSH 100 UNIT/ML IV SOLN
500.0000 [IU] | Freq: Once | INTRAVENOUS | Status: AC
  Administered 2023-06-14: 500 [IU] via INTRAVENOUS

## 2023-06-14 MED ORDER — LIDOCAINE HCL 1 % IJ SOLN
20.0000 mL | Freq: Once | INTRAMUSCULAR | Status: AC
  Administered 2023-06-14: 7 mL via INTRADERMAL

## 2023-06-14 MED ORDER — ALBUMIN HUMAN 25 % IV SOLN
INTRAVENOUS | Status: AC
  Filled 2023-06-14: qty 50

## 2023-06-14 MED ORDER — ALBUMIN HUMAN 25 % IV SOLN
INTRAVENOUS | Status: AC
  Filled 2023-06-14: qty 200

## 2023-06-14 MED ORDER — LIDOCAINE HCL 1 % IJ SOLN
INTRAMUSCULAR | Status: AC
  Filled 2023-06-14: qty 20

## 2023-06-14 MED ORDER — HEPARIN SOD (PORK) LOCK FLUSH 100 UNIT/ML IV SOLN
INTRAVENOUS | Status: AC
  Filled 2023-06-14: qty 5

## 2023-06-14 NOTE — Procedures (Signed)
PROCEDURE SUMMARY:  Successful US guided paracentesis from right lateral abdomen.  Yielded 12.4 liters of yellow fluid.  No immediate complications.  Pt tolerated well.   Specimen was not sent for labs.  EBL < 5mL  Hoyt Koch PA-C 06/14/2023 11:23 AM

## 2023-06-16 ENCOUNTER — Other Ambulatory Visit: Payer: Self-pay

## 2023-06-16 DIAGNOSIS — C221 Intrahepatic bile duct carcinoma: Secondary | ICD-10-CM

## 2023-06-17 NOTE — Assessment & Plan Note (Addendum)
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 -s/p first line Cisplatin and Gemcitabine, 09/29/19 - 07/19/20. Cisplatin discontinued 03/23/20 due to Afib. -he progressed through single agent gemcitabine (12/24/20 - 03/16/21), FOLFOX plus durvalumab (04/05/21 - 10/21/21), FOLFIRI (11/01/21 - 01/19/22), and then tried ivosidenib (02/02/22 - 04/2022) -he was switched to Stivarga on 05/15/22. Due to disease progression, treatment changed to Lenvatinib and Keytruda in 11/2022, we stopped his treatment in Feb 2024 due to very poor performance status, anorexia, and new abdominal ascites, he has recovered well from last treatment. -There is no other standard treatment available.  Due to his limited performance status, I recommend palliative care and observation for now, he has paracentesis as needed  -we previously reviewed home palliative care and hospice he is not ready and denied it -he has increased ascites and required frequent large volume paracentesis lately  -I discussed the option of pleurx in abdomen for large volume ascites, he declined  -continue supportive care, he is not ready for hospice home care

## 2023-06-18 ENCOUNTER — Other Ambulatory Visit: Payer: Self-pay

## 2023-06-18 ENCOUNTER — Inpatient Hospital Stay: Payer: Medicare HMO | Attending: Hematology

## 2023-06-18 ENCOUNTER — Encounter: Payer: Self-pay | Admitting: Hematology

## 2023-06-18 ENCOUNTER — Inpatient Hospital Stay: Payer: Medicare HMO | Admitting: Hematology

## 2023-06-18 VITALS — BP 126/77 | HR 79 | Temp 97.7°F | Resp 18 | Ht 69.0 in | Wt 185.2 lb

## 2023-06-18 DIAGNOSIS — K30 Functional dyspepsia: Secondary | ICD-10-CM | POA: Insufficient documentation

## 2023-06-18 DIAGNOSIS — I4891 Unspecified atrial fibrillation: Secondary | ICD-10-CM | POA: Insufficient documentation

## 2023-06-18 DIAGNOSIS — Z79899 Other long term (current) drug therapy: Secondary | ICD-10-CM | POA: Insufficient documentation

## 2023-06-18 DIAGNOSIS — C221 Intrahepatic bile duct carcinoma: Secondary | ICD-10-CM | POA: Diagnosis not present

## 2023-06-18 DIAGNOSIS — J90 Pleural effusion, not elsewhere classified: Secondary | ICD-10-CM | POA: Insufficient documentation

## 2023-06-18 DIAGNOSIS — Z7901 Long term (current) use of anticoagulants: Secondary | ICD-10-CM | POA: Diagnosis not present

## 2023-06-18 DIAGNOSIS — Z95828 Presence of other vascular implants and grafts: Secondary | ICD-10-CM

## 2023-06-18 DIAGNOSIS — R188 Other ascites: Secondary | ICD-10-CM | POA: Insufficient documentation

## 2023-06-18 DIAGNOSIS — K219 Gastro-esophageal reflux disease without esophagitis: Secondary | ICD-10-CM | POA: Diagnosis not present

## 2023-06-18 DIAGNOSIS — E860 Dehydration: Secondary | ICD-10-CM

## 2023-06-18 LAB — CBC WITH DIFFERENTIAL (CANCER CENTER ONLY)
Abs Immature Granulocytes: 0.04 10*3/uL (ref 0.00–0.07)
Basophils Absolute: 0.1 10*3/uL (ref 0.0–0.1)
Basophils Relative: 1 %
Eosinophils Absolute: 0.1 10*3/uL (ref 0.0–0.5)
Eosinophils Relative: 1 %
HCT: 35.5 % — ABNORMAL LOW (ref 39.0–52.0)
Hemoglobin: 12.2 g/dL — ABNORMAL LOW (ref 13.0–17.0)
Immature Granulocytes: 1 %
Lymphocytes Relative: 9 %
Lymphs Abs: 0.7 10*3/uL (ref 0.7–4.0)
MCH: 32.2 pg (ref 26.0–34.0)
MCHC: 34.4 g/dL (ref 30.0–36.0)
MCV: 93.7 fL (ref 80.0–100.0)
Monocytes Absolute: 0.7 10*3/uL (ref 0.1–1.0)
Monocytes Relative: 9 %
Neutro Abs: 6.3 10*3/uL (ref 1.7–7.7)
Neutrophils Relative %: 79 %
Platelet Count: 133 10*3/uL — ABNORMAL LOW (ref 150–400)
RBC: 3.79 MIL/uL — ABNORMAL LOW (ref 4.22–5.81)
RDW: 16 % — ABNORMAL HIGH (ref 11.5–15.5)
WBC Count: 7.9 10*3/uL (ref 4.0–10.5)
nRBC: 0 % (ref 0.0–0.2)

## 2023-06-18 LAB — CMP (CANCER CENTER ONLY)
ALT: 28 U/L (ref 0–44)
AST: 39 U/L (ref 15–41)
Albumin: 3.7 g/dL (ref 3.5–5.0)
Alkaline Phosphatase: 44 U/L (ref 38–126)
Anion gap: 8 (ref 5–15)
BUN: 37 mg/dL — ABNORMAL HIGH (ref 8–23)
CO2: 20 mmol/L — ABNORMAL LOW (ref 22–32)
Calcium: 9.3 mg/dL (ref 8.9–10.3)
Chloride: 103 mmol/L (ref 98–111)
Creatinine: 1.84 mg/dL — ABNORMAL HIGH (ref 0.61–1.24)
GFR, Estimated: 38 mL/min — ABNORMAL LOW (ref >60)
Glucose, Bld: 122 mg/dL — ABNORMAL HIGH (ref 70–99)
Potassium: 4.5 mmol/L (ref 3.5–5.1)
Sodium: 131 mmol/L — ABNORMAL LOW (ref 135–145)
Total Bilirubin: 0.9 mg/dL (ref 0.3–1.2)
Total Protein: 6 g/dL — ABNORMAL LOW (ref 6.5–8.1)

## 2023-06-18 MED ORDER — HEPARIN SOD (PORK) LOCK FLUSH 100 UNIT/ML IV SOLN
500.0000 [IU] | Freq: Once | INTRAVENOUS | Status: AC
  Administered 2023-06-18: 500 [IU]

## 2023-06-18 MED ORDER — PANTOPRAZOLE SODIUM 40 MG PO TBEC: 40.0000 mg | DELAYED_RELEASE_TABLET | Freq: Two times a day (BID) | ORAL | 2 refills | Status: DC

## 2023-06-18 MED ORDER — SODIUM CHLORIDE 0.9% FLUSH
10.0000 mL | Freq: Once | INTRAVENOUS | Status: AC
  Administered 2023-06-18: 10 mL

## 2023-06-18 NOTE — Progress Notes (Signed)
[facility]   Telephone:(336) [phone] Fax:(336) [phone]   Clinic Follow up Note   Patient Care Team: Doran Stabler, NP as PCP - General (Adult Health Nurse Practitioner) Runell Gess, MD as PCP - Cardiology (Cardiology) Almond Lint, MD as Consulting Physician (General Surgery) [name], Willaim Rayas, MD as Consulting Physician (Gastroenterology) Malachy Mood, MD as Consulting Physician (Hematology) Runell Gess, MD as Consulting Physician (Cardiology)  Date of Service:  06/18/2023  CHIEF COMPLAINT: f/u of  cholangiocarcinoma   CURRENT THERAPY:  Supportive care   ASSESSMENT:  Brandon Sherman is a 77 y.o. male with   Intrahepatic cholangiocarcinoma (HCC) 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 -s/p first line Cisplatin and Gemcitabine, 09/29/19 - 07/19/20. Cisplatin discontinued 03/23/20 due to Afib. -he progressed through single agent gemcitabine (12/24/20 - 03/16/21), FOLFOX plus durvalumab (04/05/21 - 10/21/21), FOLFIRI (11/01/21 - 01/19/22), and then tried ivosidenib (02/02/22 - 04/2022) -he was switched to Stivarga on 05/15/22. Due to disease progression, treatment changed to Lenvatinib and Keytruda in 11/2022, we stopped his treatment in Feb 2024 due to very poor performance status, anorexia, and new abdominal ascites, he has recovered well from last treatment. -There is no other standard treatment available.  Due to his limited performance status, I recommend palliative care and observation for now, he has paracentesis as needed  -we previously reviewed home palliative care and hospice he is not ready and denied it -he has increased ascites and required frequent large volume paracentesis lately  -I discussed the option of pleurx in abdomen for large volume ascites, he declined  -continue supportive care, he is not ready for hospice home  care  Indigestion and acid reflux -He is on Protonix 40 mg twice daily -I recommend him to try Tums as needed, and eat small meals -Possible related to large volume ascites   PLAN: -lab reviewed -CMP -pending -Continue large-volume paracentesis weekly with alb infusion in radiology  -recommend pt to eat smaller meals to prevent acid indigestion. -increase protein intake -continue supportive care -lab and f/u in 2 months  SUMMARY OF ONCOLOGIC HISTORY: Oncology History Overview Note  Cancer Staging Intrahepatic cholangiocarcinoma (HCC) Staging form: Intrahepatic Bile Duct, AJCC 8th Edition - Clinical stage from 08/19/2019: Stage II (cT2, cN0, cM0) - Signed by Malachy Mood, MD on 08/19/2019    Intrahepatic cholangiocarcinoma (HCC)  06/28/2019 Imaging   CT AP W Contrast 06/28/19  IMPRESSION: 1. Heterogeneous hypodensity posteriorly in the right hepatic lobe and potentially extending into the caudate lobe suspicious for a mass. There is felt to be truncation of branches of the portal vein in this vicinity and some narrowing of the hepatic vein, as well as triangular-shaped regions of abnormal hypoenhancement posteriorly in the right hepatic lobe likely representing downstream vascular effects. Cannot exclude malignancy such as cholangiocarcinoma or hepatocellular carcinoma, and follow up hepatic protocol MRI with and without contrast is recommended to further characterize. 2. 4 mm right middle lobe pulmonary nodule is likely benign but may merit surveillance. 3. Cholelithiasis. 4.  Aortic Atherosclerosis (ICD10-I70.0). 5. Prostatomegaly. 6. Mild impingement at L3-4 and L4-5.   07/31/2019 Imaging   MRI Liver 07/31/19 IMPRESSION: 1. 7.3 cm in long axis mass in the right hepatic lobe spanning into the caudate lobe, high suspicion for malignancy such as hepatocellular carcinoma or cholangiocarcinoma. Suspected effacement or occlusion of the right hepatic vein and posterior branches of  the right  portal vein. Two smaller tumor nodules along the posterior periphery of the dominant mass. Tissue diagnosis is recommended. 2. No findings of pathologic adenopathy or distant metastatic spread. 3. 9 mm gallstone in the gallbladder. There is mild gallbladder wall thickening which may be from nondistention, correlate clinically in assessing for cholecystitis. 4.  Aortic Atherosclerosis (ICD10-I70.0). 5. Mild diffuse hepatic steatosis.   08/11/2019 Initial Biopsy   DIAGNOSIS: 08/11/19  A. LIVER, RIGHT, BIOPSY:  - Adenocarcinoma.   08/18/2019 Imaging   CT Chest 08/18/19  IMPRESSION: 1. Multiple pulmonary nodules largest at approximately 7 mm in the right lower lobe, nonspecific but concerning given findings in the liver. 2. No signs of definitive metastatic disease, also with mildly enlarged upper abdominal lymph nodes as discussed.   Aortic Atherosclerosis (ICD10-I70.0).   08/19/2019 Initial Diagnosis   Intrahepatic cholangiocarcinoma (HCC)   08/19/2019 Cancer Staging   Staging form: Intrahepatic Bile Duct, AJCC 8th Edition - Clinical stage from 08/19/2019: Stage II (cT2, cN0, cM0) - Signed by Malachy Mood, MD on 08/19/2019   09/29/2019 - 07/19/2020 Chemotherapy   Cisplatin and Gemcitabine 2 weeks on/1 week off starting 09/29/19. Cisplatin held from cycle 9 (03/23/20) due to fluid status/Afib. He is now on maintenance Gemcitabine. Held after 07/19/20 to proceed with liver target therapy.     10/09/2019 Imaging   CT AP IMPRESSION: 1. The dominant right hepatic lobe mass is minimally reduced in size compared to prior exams, currently measuring 6.2 by 5.3 cm, previously 6.2 by 5.6 cm. However, there is a new small hypodense lesion centrally in the right hepatic lobe which is suspicious for a new small focus of tumor. Accordingly this is an overall mixed appearance. 2. Continued hypoenhancement in the liver downstream of the tumor likely attributable to narrowing or occlusion of the right  hepatic vein by the tumor. By virtue of its location the tumor wraps around the intrahepatic portion of the IVC. 3. 4 mm right middle lobe pulmonary nodule, stable compared to earliest available comparison of 07/18/2019. Surveillance of the patient's pulmonary nodules is recommended. 4. Other imaging findings of potential clinical significance: Coronary atherosclerosis. Cholelithiasis. Prominent stool throughout the colon favors constipation. Moderate prostatomegaly with heterogeneous enhancement of the prostate gland. Lumbar spondylosis and degenerative disc disease causing mild bilateral foraminal impingement at L3-4 and L4-5.   Aortic Atherosclerosis (ICD10-I70.0).   12/24/2019 Imaging   CT CAP W Contrast  IMPRESSION: 1. Right hepatic lobe mass and adjacent right hepatic lobe nodules appear grossly stable. No evidence of distant metastatic disease. 2. Continued stability of small pulmonary nodules. Recommend attention on follow-up. 3. Cholelithiasis. 4. Enlarged prostate. 5. Aortic atherosclerosis (ICD10-I70.0). Coronary artery calcification.   02/23/2020 Procedure   He had PAC placed on 02/23/20.    04/08/2020 Imaging   CT CAP  IMPRESSION: 1. Stable or minimally decreased size of a very ill-defined, hypodense and somewhat retractile appearing mass of the central right lobe of the liver abutting the inferior vena cava and right portal vein, measuring approximately 5.7 x 5.0 cm, previously 6.2 x 5.2 cm when measured similarly. Findings are consistent with stable or minimally improved cholangiocarcinoma. 2. Small hypodense nodules of the right lobe identified on prior examination are poorly appreciated on this single phase contrast examination although not grossly changed. Attention on follow-up. 3. There are new, moderate bilateral pleural effusions and associated atelectasis or consolidation as well as a new small pericardial effusion, nonspecific although generally concerning  and suspicious for malignant effusions. There is no directly visualized pleural nodularity.  4. Multiple small pulmonary nodules are stable.  No new nodules. 5. Coronary artery disease. Aortic Atherosclerosis (ICD10-I70.0).   07/06/2020 Imaging   MRI ABD IMPRESSION: 1. Interval decrease in size of the right hepatic lobe lesion in the medial aspect of the segment 7. Findings would suggest a good response to treatment with some contraction of the tumor. 2. No new hepatic lesions. No abdominal adenopathy or metastatic disease. 3. Stable mild intrahepatic biliary dilatation in the right hepatic lobe distal to the lesion. 4. Somewhat tortuous and almost beaded appearance of the hepatic and portal vein radicles. Findings could be radiation related. 5. Cholelithiasis.  CT chest wo contrast IMPRESSION: 1. Persistent/stable moderate-sized bilateral pleural effusions with overlying atelectasis. 2. Stable small right pulmonary nodules. No new or progressive findings. Recommend continued surveillance. 3. No mediastinal or hilar mass or adenopathy. 4. Stable advanced three-vessel coronary artery calcifications. 5. Cholelithiasis. Aortic Atherosclerosis (ICD10-I70.0)   08/11/2020 Procedure   Y90 on 08/11/20 and 08/26/20 with Dr Archer Asa    11/30/2020 Imaging   MRI Abdomen  IMPRESSION: 1. Today's study demonstrates progression of disease with enlarging central lesion in the right lobe of the liver involving portions of segments 7 and 8, now with evidence of some tumor thrombus extension into the intrahepatic portion of the inferior vena cava. This is associated with increasing intrahepatic biliary ductal dilatation in segment 7 of the liver. 2. In addition, there is some subtle hyperenhancement of the distal common bile duct at and immediately proximally to the level of the ampulla. There is also some subtle delayed enhancement around this region in the pancreatic head. This is of uncertain  etiology and significance, but may be inflammatory and currently is not associated with proximal common bile duct dilatation. However, close attention on follow-up imaging is recommended. 3. Cholelithiasis without evidence of acute cholecystitis at this time. 4. Aortic atherosclerosis.     12/24/2020 - 03/21/2021 Chemotherapy   Restart Gemcitabine 2 weeks on/1 week off given disease progression beginning 12/24/20. Discontinued after 03/21/21 due to disease progression in liver.     03/16/2021 Imaging   MRI abdomen  IMPRESSION: No significant change in size of irregular hypovascular mass in the medial right hepatic lobe.   New 1.1 cm hypovascular lesion with peripheral rim enhancement in liver dome, suspicious for metastatic disease.   New small right pleural effusion and mild ascites.   Cholelithiasis, without evidence of cholecystitis or biliary dilatation.   04/05/2021 -  Chemotherapy   Second-line FOLFOX q2weeks starting 04/05/21    04/05/2021 -  Chemotherapy   Immunotherapy with Durvalumab (Imfinzi) q4weeks starting 04/05/21. (in addition to second line FOLFOX)   06/23/2021 Imaging   MRI Abdomen IMPRESSION: 1. No significant change in subcapsular mass of the posterior right lobe of the liver. 2. No significant change in an additional small hyperenhancing lesion of the liver dome, which remains suspicious for a satellite lesion. 3. No new liver lesions. 4. 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 5. Cholelithiasis without evidence of acute cholecystitis. 6. Trace ascites.  CT Chest w/o contrast IMPRESSION: 1. Occasional small pulmonary nodules in the right lung are stable, for example a 6 mm nodule right lower lobe and a 3 mm nodule of the lateral segment right middle lobe. These are nonspecific and likely benign, incidental sequelae of infection or inflammation, metastatic disease not favored. Attention on follow-up. 2. Previously seen bilateral pleural  effusions are resolved. 3. Hypodense lesion of the posterior right lobe of the liver, keeping with known cholangiocarcinoma  and better characterized by same day MR. 4. Coronary artery disease.   10/21/2021 Imaging   EXAM: MRI ABDOMEN WITHOUT AND WITH CONTRAST  IMPRESSION: 1. Mild progression of dominant right hepatic lobe mass with involvement of the right portal, hepatic veins and minimal extension into the IVC. 2. Hepatic dome satellite lesion, new or more conspicuous today. 3. Cholelithiasis 4. Decrease in trace perihepatic and perisplenic ascites. 5.  Aortic Atherosclerosis (ICD10-I70.0).   10/21/2021 Imaging   EXAM: CT CHEST WITHOUT CONTRAST  IMPRESSION: 1. Since 06/23/2021, enlargement of a right lower lobe and possible enlargement of a right middle lobe pulmonary nodule. Findings are suspicious for metastatic disease. 2. No thoracic adenopathy. 3. Coronary artery atherosclerosis. Aortic Atherosclerosis (ICD10-I70.0).   11/01/2021 - 01/11/2022 Chemotherapy   Patient is on Treatment Plan : PANCREAS FOLFIRI q14d     01/19/2022 Imaging   EXAM: CT CHEST WITHOUT CONTRAST  IMPRESSION: 1. Continued increase in size of lung nodule within the posterior right lung base which now measures 1 cm and is suspicious for metastatic disease. The remaining lung nodules are stable from 10/21/2021. 2. No thoracic adenopathy. 3. Aortic Atherosclerosis (ICD10-I70.0). Coronary artery calcifications.   01/19/2022 Imaging   EXAM: MRI ABDOMEN WITHOUT AND WITH CONTRAST  IMPRESSION: 1. No significant change in size or appearance of the large heterogeneous enhancing mass area in the right hepatic lobe as described. There is mild interval enlargement of the adjacent satellite lesion near the dome of the right lobe now measuring 1.9 cm. 2. Redemonstration of a pulmonary nodule in the right lower lobe measuring 9 mm. 3. Small volume ascites. 4. Splenomegaly. 5. Cholelithiasis.   12/08/2022 -  12/08/2022 Chemotherapy   Patient is on Treatment Plan : UTERINE Lenvatinib (20) D1-21 + Pembrolizumab (200) D1 q21d        INTERVAL HISTORY:  Brandon Sherman is here for a follow up of  cholangiocarcinoma . He was last seen by me on 05/02/2023. He presents to the clinic accompanied by wife. Pt is getting paracentesis every week and they are draining off 12 liters. Pt state that he has some indigestion, after he eats.    All other systems were reviewed with the patient and are negative.  MEDICAL HISTORY:  Past Medical History:  Diagnosis Date   Arthritis    Diabetes (HCC)    GERD (gastroesophageal reflux disease)    Hyperlipidemia    Hypertension    Intrahepatic cholangiocarcinoma (HCC)     SURGICAL HISTORY: Past Surgical History:  Procedure Laterality Date   APPENDECTOMY  1980   CARDIOVERSION N/A 04/27/2020   Procedure: CARDIOVERSION;  Surgeon: Lars Masson, MD;  Location: [facility] ENDOSCOPY;  Service: Cardiovascular;  Laterality: N/A;   CARDIOVERSION N/A 05/19/2020   Procedure: CARDIOVERSION;  Surgeon: Parke Poisson, MD;  Location: [facility] ENDOSCOPY;  Service: Cardiovascular;  Laterality: N/A;   COLONOSCOPY     IR 3D INDEPENDENT WKST  08/11/2020   IR 3D INDEPENDENT WKST  08/11/2020   IR ANGIOGRAM SELECTIVE EACH ADDITIONAL VESSEL  08/11/2020   IR ANGIOGRAM SELECTIVE EACH ADDITIONAL VESSEL  08/11/2020   IR ANGIOGRAM SELECTIVE EACH ADDITIONAL VESSEL  08/11/2020   IR ANGIOGRAM SELECTIVE EACH ADDITIONAL VESSEL  08/11/2020   IR ANGIOGRAM SELECTIVE EACH ADDITIONAL VESSEL  08/11/2020   IR ANGIOGRAM SELECTIVE EACH ADDITIONAL VESSEL  08/11/2020   IR ANGIOGRAM SELECTIVE EACH ADDITIONAL VESSEL  08/26/2020   IR ANGIOGRAM SELECTIVE EACH ADDITIONAL VESSEL  08/26/2020   IR ANGIOGRAM VISCERAL SELECTIVE  08/11/2020   IR ANGIOGRAM VISCERAL  SELECTIVE  08/26/2020   IR EMBO ARTERIAL NOT HEMORR HEMANG INC GUIDE ROADMAPPING  08/11/2020   IR EMBO TUMOR ORGAN ISCHEMIA INFARCT INC GUIDE ROADMAPPING  08/26/2020    IR IMAGING GUIDED PORT INSERTION  02/23/2020   IR PARACENTESIS  01/05/2023   IR PARACENTESIS  01/18/2023   IR PARACENTESIS  01/25/2023   IR PARACENTESIS  02/01/2023   IR PARACENTESIS  02/08/2023   IR PARACENTESIS  02/15/2023   IR PARACENTESIS  02/23/2023   IR PARACENTESIS  03/01/2023   IR PARACENTESIS  03/08/2023   IR PARACENTESIS  03/15/2023   IR PARACENTESIS  03/22/2023   IR PARACENTESIS  03/29/2023   IR PARACENTESIS  04/05/2023   IR PARACENTESIS  04/12/2023   IR PARACENTESIS  04/19/2023   IR PARACENTESIS  04/27/2023   IR PARACENTESIS  05/03/2023   IR PARACENTESIS  05/10/2023   IR PARACENTESIS  05/17/2023   IR PARACENTESIS  05/25/2023   IR PARACENTESIS  05/31/2023   IR PARACENTESIS  06/07/2023   IR PARACENTESIS  06/14/2023   IR RADIOLOGIST EVAL & MGMT  07/14/2020   IR RADIOLOGIST EVAL & MGMT  09/30/2020   IR RADIOLOGIST EVAL & MGMT  12/01/2020   IR US GUIDE VASC ACCESS LEFT  08/26/2020   IR US GUIDE VASC ACCESS RIGHT  08/11/2020    I have reviewed the social history and family history with the patient and they are unchanged from previous note.  ALLERGIES:  has No Known Allergies.  MEDICATIONS:  Current Outpatient Medications  Medication Sig Dispense Refill   allopurinol (ZYLOPRIM) 100 MG tablet Take 100 mg by mouth daily.     apixaban (ELIQUIS) 5 MG TABS tablet TAKE 1 TABLET TWICE DAILY 180 tablet 1   atorvastatin (LIPITOR) 40 MG tablet Take 1 tablet (40 mg total) by mouth daily. 90 tablet 3   clindamycin (CLEOCIN) 300 MG capsule Take 1 capsule (300 mg total) by mouth 3 (three) times daily. 21 capsule 0   doxycycline (VIBRAMYCIN) 100 MG capsule Take 1 capsule (100 mg total) by mouth 2 (two) times daily. One po bid x 7 days 14 capsule 0   fenofibrate (TRICOR) 145 MG tablet Take 145 mg by mouth daily.     finasteride (PROSCAR) 5 MG tablet Take 5 mg by mouth daily.     furosemide (LASIX) 40 MG tablet TAKE 2 TABLETS BY MOUTH 2 TIMES DAILY. (Patient taking differently: Take 40 mg by mouth 3 (three) times a  week. TAKE ONE TABLET (40 mg) MONDAY, WEDNESDAY, AND FRIDAY.) 360 tablet 3   gabapentin (NEURONTIN) 100 MG capsule Take 100 mg by mouth at bedtime.     hydrALAZINE (APRESOLINE) 50 MG tablet Take 1.5 tablets (75 mg total) by mouth 3 (three) times daily. 135 tablet 3   lidocaine-prilocaine (EMLA) cream Apply 1 Application topically as needed. 30 g 3   metFORMIN (GLUCOPHAGE-XR) 500 MG 24 hr tablet      metoprolol succinate (TOPROL-XL) 50 MG 24 hr tablet TAKE 1 TABLET BY MOUTH DAILY. TAKE WITH OR IMMEDIATELY FOLLOWING A MEAL. 90 tablet 2   olmesartan (BENICAR) 40 MG tablet Take 1 tablet (40 mg total) by mouth daily. 90 tablet 3   ondansetron (ZOFRAN) 8 MG tablet Take 1 tablet (8 mg total) by mouth every 8 (eight) hours as needed for nausea or vomiting. 30 tablet 1   pantoprazole (PROTONIX) 40 MG tablet Take 1 tablet (40 mg total) by mouth 2 (two) times daily. 180 tablet 2   potassium chloride SA (KLOR-CON  M20) 20 MEQ tablet Take 1 tablet (20 mEq total) by mouth daily. 7 tablet 0   sucralfate (CARAFATE) 1 g tablet Take 1 tablet (1 g total) by mouth every 6 (six) hours as needed. Please schedule a yearly follow up for further refills: [phone] 60 tablet 0   tamsulosin (FLOMAX) 0.4 MG CAPS capsule Take 0.4 mg by mouth daily.     No current facility-administered medications for this visit.    PHYSICAL EXAMINATION: ECOG PERFORMANCE STATUS: 2 - Symptomatic, <50% confined to bed  Vitals:   06/18/23 0832  BP: 126/77  Pulse: 79  Resp: 18  Temp: 97.7 F (36.5 C)  SpO2: 100%   Wt Readings from Last 3 Encounters:  06/18/23 185 lb 3.2 oz (84 kg)  05/02/23 187 lb 14.4 oz (85.2 kg)  03/21/23 205 lb 8 oz (93.2 kg)     GENERAL:alert, no distress and comfortable SKIN: skin color normal, no rashes or significant lesions EYES: normal, Conjunctiva are pink and non-injected, sclera clear  NEURO: alert & oriented x 3 with fluent speech   LABORATORY DATA:  I have reviewed the data as listed     Latest Ref Rng & Units 06/18/2023    8:13 AM 05/02/2023    9:46 AM 03/21/2023   11:05 AM  CBC  WBC 4.0 - 10.5 K/uL 7.9  6.1  5.9   Hemoglobin 13.0 - 17.0 g/dL 38.7  56.4  33.2   Hematocrit 39.0 - 52.0 % 35.5  36.8  35.3   Platelets 150 - 400 K/uL 133  108  123         Latest Ref Rng & Units 05/02/2023    9:46 AM 04/05/2023    2:50 PM 03/21/2023   11:05 AM  CMP  Glucose 70 - 99 mg/dL 951  86  884   BUN 8 - 23 mg/dL 34  29  30   Creatinine 0.61 - 1.24 mg/dL 1.66  0.63  0.16   Sodium 135 - 145 mmol/L 137  138  136   Potassium 3.5 - 5.1 mmol/L 4.3  5.1  4.2   Chloride 98 - 111 mmol/L 107  102  108   CO2 22 - 32 mmol/L 23  22  24    Calcium 8.9 - 10.3 mg/dL 9.3  9.5  8.8   Total Protein 6.5 - 8.1 g/dL 6.4   6.1   Total Bilirubin 0.3 - 1.2 mg/dL 1.1   0.8   Alkaline Phos 38 - 126 U/L 43   55   AST 15 - 41 U/L 44   41   ALT 0 - 44 U/L 28   28       RADIOGRAPHIC STUDIES: I have personally reviewed the radiological images as listed and agreed with the findings in the report. No results found.    No orders of the defined types were placed in this encounter.  All questions were answered. The patient knows to call the clinic with any problems, questions or concerns. No barriers to learning was detected. The total time spent in the appointment was 20 minutes.     Malachy Mood, MD 06/18/2023   Carolin Coy, CMA, am acting as scribe for Malachy Mood, MD.   I have reviewed the above documentation for accuracy and completeness, and I agree with the above.

## 2023-06-21 ENCOUNTER — Ambulatory Visit (HOSPITAL_COMMUNITY)
Admission: RE | Admit: 2023-06-21 | Discharge: 2023-06-21 | Disposition: A | Discharge status: Home / Self Care | Payer: Medicare HMO | Source: Ambulatory Visit | Attending: Hematology | Admitting: Hematology

## 2023-06-21 DIAGNOSIS — C221 Intrahepatic bile duct carcinoma: Secondary | ICD-10-CM | POA: Diagnosis not present

## 2023-06-21 DIAGNOSIS — R188 Other ascites: Secondary | ICD-10-CM | POA: Diagnosis not present

## 2023-06-21 HISTORY — PX: IR PARACENTESIS: IMG2679

## 2023-06-21 MED ORDER — HEPARIN SOD (PORK) LOCK FLUSH 100 UNIT/ML IV SOLN
INTRAVENOUS | Status: AC
  Filled 2023-06-21: qty 5

## 2023-06-21 MED ORDER — LIDOCAINE HCL 1 % IJ SOLN
INTRAMUSCULAR | Status: AC
  Filled 2023-06-21: qty 20

## 2023-06-21 MED ORDER — HEPARIN SOD (PORK) LOCK FLUSH 100 UNIT/ML IV SOLN
500.0000 [IU] | Freq: Once | INTRAVENOUS | Status: AC
  Administered 2023-06-21: 500 [IU] via INTRAVENOUS

## 2023-06-21 MED ORDER — LIDOCAINE HCL 1 % IJ SOLN
20.0000 mL | Freq: Once | INTRAMUSCULAR | Status: AC
  Administered 2023-06-21: 8 mL via INTRADERMAL

## 2023-06-21 MED ORDER — ALBUMIN HUMAN 25 % IV SOLN
INTRAVENOUS | Status: AC
  Filled 2023-06-21: qty 300

## 2023-06-21 NOTE — Procedures (Signed)
PROCEDURE SUMMARY:  Successful US guided paracentesis from right abdomen.  Yielded 12.5 L of clear yellow fluid.  No immediate complications.  Pt tolerated well.   Specimen not sent for labs.  EBL < 2 mL  Mickie Kay, NP 06/21/2023 11:58 AM

## 2023-06-24 ENCOUNTER — Other Ambulatory Visit: Payer: Self-pay | Admitting: Cardiovascular Disease

## 2023-06-25 ENCOUNTER — Other Ambulatory Visit: Payer: Self-pay | Admitting: Cardiovascular Disease

## 2023-06-28 ENCOUNTER — Ambulatory Visit (HOSPITAL_COMMUNITY)
Admission: RE | Admit: 2023-06-28 | Discharge: 2023-06-28 | Disposition: A | Discharge status: Home / Self Care | Payer: Medicare HMO | Source: Ambulatory Visit | Attending: Hematology | Admitting: Hematology

## 2023-06-28 DIAGNOSIS — C221 Intrahepatic bile duct carcinoma: Secondary | ICD-10-CM | POA: Diagnosis not present

## 2023-06-28 DIAGNOSIS — R188 Other ascites: Secondary | ICD-10-CM | POA: Diagnosis not present

## 2023-06-28 DIAGNOSIS — R0609 Other forms of dyspnea: Secondary | ICD-10-CM | POA: Insufficient documentation

## 2023-06-28 DIAGNOSIS — R140 Abdominal distension (gaseous): Secondary | ICD-10-CM | POA: Diagnosis not present

## 2023-06-28 HISTORY — PX: IR PARACENTESIS: IMG2679

## 2023-06-28 MED ORDER — LIDOCAINE HCL 1 % IJ SOLN
20.0000 mL | Freq: Once | INTRAMUSCULAR | Status: AC
  Administered 2023-06-28: 10 mL via INTRADERMAL

## 2023-06-28 MED ORDER — ALBUMIN HUMAN 25 % IV SOLN
INTRAVENOUS | Status: AC
  Filled 2023-06-28: qty 300

## 2023-06-28 MED ORDER — LIDOCAINE HCL 1 % IJ SOLN
INTRAMUSCULAR | Status: AC
  Filled 2023-06-28: qty 20

## 2023-06-28 MED ORDER — ALBUMIN HUMAN 25 % IV SOLN
75.0000 g | Freq: Once | INTRAVENOUS | Status: AC
  Administered 2023-06-28: 75 g via INTRAVENOUS
  Filled 2023-06-28: qty 300

## 2023-06-28 MED ORDER — SODIUM CHLORIDE 0.9% FLUSH: 10.0000 mL | Freq: Two times a day (BID) | INTRAVENOUS | Status: DC

## 2023-06-28 MED ORDER — SODIUM CHLORIDE 0.9% FLUSH: 10.0000 mL | INTRAVENOUS | Status: DC | PRN

## 2023-06-28 MED ORDER — HEPARIN SOD (PORK) LOCK FLUSH 100 UNIT/ML IV SOLN
INTRAVENOUS | Status: AC
  Filled 2023-06-28: qty 5

## 2023-06-28 MED ORDER — CHLORHEXIDINE GLUCONATE CLOTH 2 % EX PADS: 6.0000 | MEDICATED_PAD | Freq: Every day | CUTANEOUS | Status: DC

## 2023-06-28 MED ORDER — HEPARIN SOD (PORK) LOCK FLUSH 100 UNIT/ML IV SOLN
500.0000 [IU] | Freq: Once | INTRAVENOUS | Status: AC
  Administered 2023-06-28: 500 [IU] via INTRAVENOUS

## 2023-06-28 MED ORDER — ALBUMIN HUMAN 25 % IV SOLN
25.0000 g | Freq: Once | INTRAVENOUS | Status: AC
  Administered 2023-06-28: 25 g via INTRAVENOUS

## 2023-06-28 NOTE — Procedures (Signed)
PROCEDURE SUMMARY:  Successful image-guided paracentesis from the right lower abdomen.  Yielded 12.1 liters of clear yellow fluid.  No immediate complications.  EBL = trace. Patient tolerated well.   Specimen was not sent for labs.  Please see imaging section of Epic for full dictation.   Kennieth Francois PA-C 06/28/2023 10:29 AM

## 2023-07-04 DIAGNOSIS — Z6827 Body mass index (BMI) 27.0-27.9, adult: Secondary | ICD-10-CM | POA: Diagnosis not present

## 2023-07-04 DIAGNOSIS — E1122 Type 2 diabetes mellitus with diabetic chronic kidney disease: Secondary | ICD-10-CM | POA: Diagnosis not present

## 2023-07-04 DIAGNOSIS — I129 Hypertensive chronic kidney disease with stage 1 through stage 4 chronic kidney disease, or unspecified chronic kidney disease: Secondary | ICD-10-CM | POA: Diagnosis not present

## 2023-07-04 DIAGNOSIS — I509 Heart failure, unspecified: Secondary | ICD-10-CM | POA: Diagnosis not present

## 2023-07-04 DIAGNOSIS — C221 Intrahepatic bile duct carcinoma: Secondary | ICD-10-CM | POA: Diagnosis not present

## 2023-07-04 DIAGNOSIS — E785 Hyperlipidemia, unspecified: Secondary | ICD-10-CM | POA: Diagnosis not present

## 2023-07-04 DIAGNOSIS — N182 Chronic kidney disease, stage 2 (mild): Secondary | ICD-10-CM | POA: Diagnosis not present

## 2023-07-05 ENCOUNTER — Ambulatory Visit (HOSPITAL_COMMUNITY)
Admission: RE | Admit: 2023-07-05 | Discharge: 2023-07-05 | Disposition: A | Discharge status: Home / Self Care | Payer: Medicare HMO | Source: Ambulatory Visit | Attending: Hematology | Admitting: Hematology

## 2023-07-05 DIAGNOSIS — R188 Other ascites: Secondary | ICD-10-CM | POA: Insufficient documentation

## 2023-07-05 DIAGNOSIS — C221 Intrahepatic bile duct carcinoma: Secondary | ICD-10-CM

## 2023-07-05 HISTORY — PX: IR PARACENTESIS: IMG2679

## 2023-07-05 MED ORDER — LIDOCAINE HCL 1 % IJ SOLN
INTRAMUSCULAR | Status: AC
  Filled 2023-07-05: qty 20

## 2023-07-05 MED ORDER — HEPARIN SOD (PORK) LOCK FLUSH 100 UNIT/ML IV SOLN
INTRAVENOUS | Status: AC
  Filled 2023-07-05: qty 5

## 2023-07-05 MED ORDER — ALBUMIN HUMAN 25 % IV SOLN
75.0000 g | Freq: Once | INTRAVENOUS | Status: AC
  Administered 2023-07-05: 75 g via INTRAVENOUS

## 2023-07-05 MED ORDER — HEPARIN SOD (PORK) LOCK FLUSH 100 UNIT/ML IV SOLN
500.0000 [IU] | Freq: Once | INTRAVENOUS | Status: AC
  Administered 2023-07-05: 500 [IU] via INTRAVENOUS

## 2023-07-05 MED ORDER — ALBUMIN HUMAN 25 % IV SOLN
INTRAVENOUS | Status: AC
  Filled 2023-07-05: qty 300

## 2023-07-05 MED ORDER — LIDOCAINE HCL 1 % IJ SOLN
20.0000 mL | Freq: Once | INTRAMUSCULAR | Status: AC
  Administered 2023-07-05: 15 mL

## 2023-07-08 ENCOUNTER — Other Ambulatory Visit: Payer: Self-pay | Admitting: Cardiovascular Disease

## 2023-07-09 ENCOUNTER — Other Ambulatory Visit: Payer: Self-pay | Admitting: Hematology

## 2023-07-09 ENCOUNTER — Encounter: Payer: Self-pay | Admitting: Hematology

## 2023-07-09 DIAGNOSIS — R0609 Other forms of dyspnea: Secondary | ICD-10-CM

## 2023-07-09 DIAGNOSIS — C221 Intrahepatic bile duct carcinoma: Secondary | ICD-10-CM

## 2023-07-09 DIAGNOSIS — R140 Abdominal distension (gaseous): Secondary | ICD-10-CM

## 2023-07-11 DIAGNOSIS — R972 Elevated prostate specific antigen [PSA]: Secondary | ICD-10-CM | POA: Diagnosis not present

## 2023-07-11 DIAGNOSIS — C228 Malignant neoplasm of liver, primary, unspecified as to type: Secondary | ICD-10-CM | POA: Diagnosis not present

## 2023-07-11 DIAGNOSIS — N401 Enlarged prostate with lower urinary tract symptoms: Secondary | ICD-10-CM | POA: Diagnosis not present

## 2023-07-11 DIAGNOSIS — R339 Retention of urine, unspecified: Secondary | ICD-10-CM | POA: Diagnosis not present

## 2023-07-12 ENCOUNTER — Ambulatory Visit (HOSPITAL_COMMUNITY)
Admission: RE | Admit: 2023-07-12 | Discharge: 2023-07-12 | Disposition: A | Discharge status: Home / Self Care | Payer: Medicare HMO | Source: Ambulatory Visit | Attending: Hematology | Admitting: Hematology

## 2023-07-12 DIAGNOSIS — R188 Other ascites: Secondary | ICD-10-CM | POA: Diagnosis not present

## 2023-07-12 DIAGNOSIS — C221 Intrahepatic bile duct carcinoma: Secondary | ICD-10-CM | POA: Insufficient documentation

## 2023-07-12 DIAGNOSIS — R140 Abdominal distension (gaseous): Secondary | ICD-10-CM

## 2023-07-12 DIAGNOSIS — R0609 Other forms of dyspnea: Secondary | ICD-10-CM | POA: Insufficient documentation

## 2023-07-12 HISTORY — PX: IR PARACENTESIS: IMG2679

## 2023-07-12 MED ORDER — LIDOCAINE HCL 1 % IJ SOLN
INTRAMUSCULAR | Status: AC
  Filled 2023-07-12: qty 20

## 2023-07-12 MED ORDER — ALBUMIN HUMAN 25 % IV SOLN
INTRAVENOUS | Status: AC
  Filled 2023-07-12: qty 200

## 2023-07-12 MED ORDER — LIDOCAINE HCL 1 % IJ SOLN
10.0000 mL | Freq: Once | INTRAMUSCULAR | Status: AC
  Administered 2023-07-12: 10 mL via INTRADERMAL

## 2023-07-12 MED ORDER — HEPARIN SOD (PORK) LOCK FLUSH 100 UNIT/ML IV SOLN
500.0000 [IU] | Freq: Once | INTRAVENOUS | Status: AC
  Administered 2023-07-12: 500 [IU] via INTRAVENOUS

## 2023-07-12 MED ORDER — HEPARIN SOD (PORK) LOCK FLUSH 100 UNIT/ML IV SOLN
INTRAVENOUS | Status: AC
  Filled 2023-07-12: qty 5

## 2023-07-12 MED ORDER — ALBUMIN HUMAN 25 % IV SOLN
50.0000 g | Freq: Once | INTRAVENOUS | Status: AC
  Administered 2023-07-12: 50 g via INTRAVENOUS

## 2023-07-12 NOTE — Procedures (Signed)
PROCEDURE SUMMARY:  Successful US guided paracentesis from right lateral abdomen.  Yielded 11.9 liters of cloudy, yellow fluid.  No immediate complications.  Pt tolerated well.   Specimen was sent for labs.  EBL < 5mL  Hoyt Koch PA-C 07/12/2023 12:54 PM

## 2023-07-16 ENCOUNTER — Other Ambulatory Visit: Payer: Self-pay | Admitting: Hematology

## 2023-07-16 DIAGNOSIS — R0609 Other forms of dyspnea: Secondary | ICD-10-CM

## 2023-07-16 DIAGNOSIS — R140 Abdominal distension (gaseous): Secondary | ICD-10-CM

## 2023-07-16 DIAGNOSIS — C221 Intrahepatic bile duct carcinoma: Secondary | ICD-10-CM

## 2023-07-19 ENCOUNTER — Other Ambulatory Visit (HOSPITAL_COMMUNITY): Payer: Medicare HMO

## 2023-07-19 ENCOUNTER — Ambulatory Visit (HOSPITAL_COMMUNITY)
Admission: RE | Admit: 2023-07-19 | Discharge: 2023-07-19 | Disposition: A | Discharge status: Home / Self Care | Payer: Medicare HMO | Source: Ambulatory Visit | Attending: Hematology | Admitting: Hematology

## 2023-07-19 DIAGNOSIS — R0609 Other forms of dyspnea: Secondary | ICD-10-CM | POA: Insufficient documentation

## 2023-07-19 DIAGNOSIS — R180 Malignant ascites: Secondary | ICD-10-CM | POA: Insufficient documentation

## 2023-07-19 DIAGNOSIS — R140 Abdominal distension (gaseous): Secondary | ICD-10-CM | POA: Diagnosis not present

## 2023-07-19 DIAGNOSIS — C221 Intrahepatic bile duct carcinoma: Secondary | ICD-10-CM | POA: Insufficient documentation

## 2023-07-19 DIAGNOSIS — R188 Other ascites: Secondary | ICD-10-CM | POA: Diagnosis not present

## 2023-07-19 HISTORY — PX: IR PARACENTESIS: IMG2679

## 2023-07-19 MED ORDER — LIDOCAINE HCL 1 % IJ SOLN
20.0000 mL | Freq: Once | INTRAMUSCULAR | Status: AC
  Administered 2023-07-19: 9 mL via INTRADERMAL

## 2023-07-19 MED ORDER — ALBUMIN HUMAN 25 % IV SOLN
50.0000 g | Freq: Once | INTRAVENOUS | Status: AC
  Administered 2023-07-19: 50 g via INTRAVENOUS

## 2023-07-19 MED ORDER — LIDOCAINE HCL 1 % IJ SOLN
INTRAMUSCULAR | Status: AC
  Filled 2023-07-19: qty 20

## 2023-07-19 MED ORDER — HEPARIN SOD (PORK) LOCK FLUSH 100 UNIT/ML IV SOLN
500.0000 [IU] | Freq: Once | INTRAVENOUS | Status: AC
  Administered 2023-07-19: 500 [IU] via INTRAVENOUS

## 2023-07-19 MED ORDER — HEPARIN SOD (PORK) LOCK FLUSH 100 UNIT/ML IV SOLN
INTRAVENOUS | Status: AC
  Filled 2023-07-19: qty 5

## 2023-07-19 MED ORDER — ALBUMIN HUMAN 25 % IV SOLN
INTRAVENOUS | Status: AC
  Filled 2023-07-19: qty 100

## 2023-07-19 MED ORDER — ALBUMIN HUMAN 25 % IV SOLN
INTRAVENOUS | Status: AC
  Filled 2023-07-19: qty 50

## 2023-07-19 MED ORDER — ALBUMIN HUMAN 25 % IV SOLN
12.5000 g | Freq: Once | INTRAVENOUS | Status: AC
  Administered 2023-07-19: 12.5 g via INTRAVENOUS

## 2023-07-19 NOTE — Procedures (Signed)
PROCEDURE SUMMARY:  Successful US guided paracentesis from right lateral abdomen.  Yielded 12.1 liters of clear yellow fluid.  No immediate complications.  Patient tolerated well.  EBL = trace  Jerry Caras Celina Shiley PA-C 07/19/2023 12:37 PM

## 2023-07-24 ENCOUNTER — Other Ambulatory Visit: Payer: Self-pay | Admitting: Cardiovascular Disease

## 2023-07-24 ENCOUNTER — Other Ambulatory Visit: Payer: Self-pay | Admitting: Hematology

## 2023-07-24 DIAGNOSIS — C221 Intrahepatic bile duct carcinoma: Secondary | ICD-10-CM

## 2023-07-24 DIAGNOSIS — R140 Abdominal distension (gaseous): Secondary | ICD-10-CM

## 2023-07-24 DIAGNOSIS — R0609 Other forms of dyspnea: Secondary | ICD-10-CM

## 2023-07-26 ENCOUNTER — Ambulatory Visit (HOSPITAL_COMMUNITY)
Admission: RE | Admit: 2023-07-26 | Discharge: 2023-07-26 | Disposition: A | Discharge status: Home / Self Care | Payer: Medicare HMO | Source: Ambulatory Visit | Attending: Hematology | Admitting: Hematology

## 2023-07-26 DIAGNOSIS — R0609 Other forms of dyspnea: Secondary | ICD-10-CM | POA: Insufficient documentation

## 2023-07-26 DIAGNOSIS — R188 Other ascites: Secondary | ICD-10-CM | POA: Insufficient documentation

## 2023-07-26 DIAGNOSIS — C221 Intrahepatic bile duct carcinoma: Secondary | ICD-10-CM | POA: Diagnosis not present

## 2023-07-26 DIAGNOSIS — R140 Abdominal distension (gaseous): Secondary | ICD-10-CM

## 2023-07-26 HISTORY — PX: IR PARACENTESIS: IMG2679

## 2023-07-26 MED ORDER — LIDOCAINE HCL 1 % IJ SOLN
INTRAMUSCULAR | Status: AC
  Filled 2023-07-26: qty 20

## 2023-07-26 MED ORDER — HEPARIN SOD (PORK) LOCK FLUSH 100 UNIT/ML IV SOLN
INTRAVENOUS | Status: AC
  Filled 2023-07-26: qty 5

## 2023-07-26 MED ORDER — ALBUMIN HUMAN 25 % IV SOLN
INTRAVENOUS | Status: AC
  Filled 2023-07-26: qty 200

## 2023-07-26 MED ORDER — HEPARIN SOD (PORK) LOCK FLUSH 100 UNIT/ML IV SOLN
500.0000 [IU] | Freq: Once | INTRAVENOUS | Status: AC
  Administered 2023-07-26: 500 [IU] via INTRAVENOUS

## 2023-07-26 MED ORDER — ALBUMIN HUMAN 25 % IV SOLN
50.0000 g | Freq: Once | INTRAVENOUS | Status: AC
  Administered 2023-07-26: 50 g via INTRAVENOUS

## 2023-07-26 NOTE — Procedures (Signed)
PROCEDURE SUMMARY:  Successful US guided paracentesis from right lateral abdomen.  Yielded 11.5 liters of clear yellow fluid.  No immediate complications.  Patient tolerated well.  EBL = trace   Takoda Siedlecki S Antwine Agosto PA-C 07/26/2023 12:48 PM

## 2023-07-27 ENCOUNTER — Other Ambulatory Visit: Payer: Self-pay | Admitting: Cardiovascular Disease

## 2023-07-27 DIAGNOSIS — I4811 Longstanding persistent atrial fibrillation: Secondary | ICD-10-CM

## 2023-07-27 NOTE — Telephone Encounter (Signed)
Prescription refill request for Eliquis received. Indication: Afib  Last office visit: 11/29/22 Allyson Sabal)  Scr: 1.84 (06/18/23)  Age: 76 Weight: 84kg  Appropriate dose. Refill sent.

## 2023-08-01 ENCOUNTER — Other Ambulatory Visit (HOSPITAL_COMMUNITY): Payer: Medicare HMO

## 2023-08-02 ENCOUNTER — Ambulatory Visit (HOSPITAL_COMMUNITY)
Admission: RE | Admit: 2023-08-02 | Discharge: 2023-08-02 | Disposition: A | Discharge status: Home / Self Care | Payer: Medicare HMO | Source: Ambulatory Visit | Attending: Hematology | Admitting: Hematology

## 2023-08-02 DIAGNOSIS — R188 Other ascites: Secondary | ICD-10-CM | POA: Insufficient documentation

## 2023-08-02 DIAGNOSIS — C221 Intrahepatic bile duct carcinoma: Secondary | ICD-10-CM | POA: Diagnosis not present

## 2023-08-02 HISTORY — PX: IR PARACENTESIS: IMG2679

## 2023-08-02 MED ORDER — HEPARIN SOD (PORK) LOCK FLUSH 100 UNIT/ML IV SOLN
500.0000 [IU] | Freq: Once | INTRAVENOUS | Status: AC
  Administered 2023-08-02: 500 [IU] via INTRAVENOUS

## 2023-08-02 MED ORDER — ALBUMIN HUMAN 25 % IV SOLN
INTRAVENOUS | Status: AC
  Filled 2023-08-02: qty 200

## 2023-08-02 MED ORDER — LIDOCAINE HCL 1 % IJ SOLN
20.0000 mL | Freq: Once | INTRAMUSCULAR | Status: AC
  Administered 2023-08-02: 10 mL via INTRADERMAL

## 2023-08-02 MED ORDER — HEPARIN SOD (PORK) LOCK FLUSH 100 UNIT/ML IV SOLN
INTRAVENOUS | Status: AC
  Filled 2023-08-02: qty 5

## 2023-08-02 MED ORDER — ALBUMIN HUMAN 25 % IV SOLN: 50.0000 g | Freq: Once | INTRAVENOUS | Status: DC

## 2023-08-02 MED ORDER — LIDOCAINE HCL 1 % IJ SOLN
INTRAMUSCULAR | Status: AC
  Filled 2023-08-02: qty 20

## 2023-08-02 NOTE — Procedures (Signed)
PROCEDURE SUMMARY:  Successful US guided paracentesis from left abdomen .  Yielded 10.9 of clear yellow fluid.  No immediate complications.  Pt tolerated well.   Specimen not sent for labs.  EBL < 2 mL  Mickie Kay, NP 08/02/2023 2:01 PM

## 2023-08-09 ENCOUNTER — Ambulatory Visit (HOSPITAL_COMMUNITY)
Admission: RE | Admit: 2023-08-09 | Discharge: 2023-08-09 | Disposition: A | Discharge status: Home / Self Care | Payer: Medicare HMO | Source: Ambulatory Visit | Attending: Hematology | Admitting: Hematology

## 2023-08-09 DIAGNOSIS — R188 Other ascites: Secondary | ICD-10-CM | POA: Insufficient documentation

## 2023-08-09 DIAGNOSIS — C221 Intrahepatic bile duct carcinoma: Secondary | ICD-10-CM | POA: Diagnosis not present

## 2023-08-09 HISTORY — PX: IR PARACENTESIS: IMG2679

## 2023-08-09 MED ORDER — ALBUMIN HUMAN 25 % IV SOLN
INTRAVENOUS | Status: AC
  Filled 2023-08-09: qty 200

## 2023-08-09 MED ORDER — HEPARIN SOD (PORK) LOCK FLUSH 100 UNIT/ML IV SOLN
INTRAVENOUS | Status: AC
  Filled 2023-08-09: qty 5

## 2023-08-09 MED ORDER — LIDOCAINE HCL 1 % IJ SOLN
INTRAMUSCULAR | Status: AC
  Filled 2023-08-09: qty 20

## 2023-08-09 MED ORDER — LIDOCAINE HCL 1 % IJ SOLN: 20.0000 mL | Freq: Once | INTRAMUSCULAR | Status: DC

## 2023-08-09 MED ORDER — ALBUMIN HUMAN 25 % IV SOLN
50.0000 g | Freq: Once | INTRAVENOUS | Status: AC
  Administered 2023-08-09: 50 g via INTRAVENOUS

## 2023-08-09 MED ORDER — HEPARIN SOD (PORK) LOCK FLUSH 100 UNIT/ML IV SOLN
500.0000 [IU] | Freq: Once | INTRAVENOUS | Status: AC
  Administered 2023-08-09: 500 [IU] via INTRAVENOUS

## 2023-08-09 NOTE — Procedures (Signed)
PROCEDURE SUMMARY:  Successful US guided paracentesis from right lateral abdomen.  Yielded 10.3 liters of yellow fluid.  No immediate complications.  Pt tolerated well.   Specimen was not sent for labs.  EBL < 5mL  Hoyt Koch PA-C 08/09/2023 1:10 PM

## 2023-08-12 NOTE — Assessment & Plan Note (Signed)
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 -s/p first line Cisplatin and Gemcitabine, 09/29/19 - 07/19/20. Cisplatin discontinued 03/23/20 due to Afib. -he progressed through single agent gemcitabine (12/24/20 - 03/16/21), FOLFOX plus durvalumab (04/05/21 - 10/21/21), FOLFIRI (11/01/21 - 01/19/22), and then tried ivosidenib (02/02/22 - 04/2022) -he was switched to Stivarga on 05/15/22. Due to disease progression, treatment changed to Lenvatinib and Keytruda in 11/2022, we stopped his treatment in Feb 2024 due to very poor performance status, anorexia, and new abdominal ascites, he has recovered well from last treatment. -There is no other standard treatment available.  Due to his limited performance status, I recommend palliative care and observation for now, he has paracentesis as needed  -we previously reviewed home palliative care and hospice he is not ready and denied it -he has increased ascites and required frequent large volume paracentesis lately  -I discussed the option of pleurx in abdomen for large volume ascites, he declined  -continue supportive care, he is not ready for hospice home care

## 2023-08-13 ENCOUNTER — Inpatient Hospital Stay: Payer: Medicare HMO | Attending: Hematology

## 2023-08-13 ENCOUNTER — Encounter: Payer: Self-pay | Admitting: Hematology

## 2023-08-13 ENCOUNTER — Other Ambulatory Visit: Payer: Self-pay

## 2023-08-13 ENCOUNTER — Inpatient Hospital Stay (HOSPITAL_BASED_OUTPATIENT_CLINIC_OR_DEPARTMENT_OTHER): Payer: Medicare HMO | Admitting: Hematology

## 2023-08-13 VITALS — BP 120/63 | HR 55 | Temp 97.8°F | Resp 16

## 2023-08-13 VITALS — BP 120/72 | HR 62 | Temp 97.6°F | Resp 15 | Ht 67.0 in | Wt 175.6 lb

## 2023-08-13 DIAGNOSIS — Z79899 Other long term (current) drug therapy: Secondary | ICD-10-CM | POA: Insufficient documentation

## 2023-08-13 DIAGNOSIS — Z95828 Presence of other vascular implants and grafts: Secondary | ICD-10-CM

## 2023-08-13 DIAGNOSIS — R188 Other ascites: Secondary | ICD-10-CM | POA: Diagnosis not present

## 2023-08-13 DIAGNOSIS — C221 Intrahepatic bile duct carcinoma: Secondary | ICD-10-CM | POA: Insufficient documentation

## 2023-08-13 DIAGNOSIS — R634 Abnormal weight loss: Secondary | ICD-10-CM | POA: Insufficient documentation

## 2023-08-13 DIAGNOSIS — E860 Dehydration: Secondary | ICD-10-CM

## 2023-08-13 DIAGNOSIS — K219 Gastro-esophageal reflux disease without esophagitis: Secondary | ICD-10-CM | POA: Diagnosis not present

## 2023-08-13 MED ORDER — HEPARIN SOD (PORK) LOCK FLUSH 100 UNIT/ML IV SOLN
500.0000 [IU] | Freq: Once | INTRAVENOUS | Status: AC
  Administered 2023-08-13: 500 [IU]

## 2023-08-13 MED ORDER — SODIUM CHLORIDE 0.9% FLUSH
10.0000 mL | Freq: Once | INTRAVENOUS | Status: AC
  Administered 2023-08-13: 10 mL

## 2023-08-13 MED ORDER — ALBUMIN HUMAN 25 % IV SOLN
25.0000 g | Freq: Once | INTRAVENOUS | Status: DC
  Filled 2023-08-13: qty 100

## 2023-08-13 NOTE — Progress Notes (Signed)
[facility]   [phone] [phone]   Clinic Follow up Note   Patient Care Team: Doran Stabler, NP as PCP - General (Adult Health Nurse Practitioner) Runell Gess, MD as PCP - Cardiology (Cardiology) Almond Lint, MD as Consulting Physician (General Surgery) [name], Willaim Rayas, MD as Consulting Physician (Gastroenterology) Malachy Mood, MD as Consulting Physician (Hematology) Runell Gess, MD as Consulting Physician (Cardiology)  Date of Service:  08/13/2023  CHIEF COMPLAINT: f/u of metastatic cholangiocarcinoma  CURRENT THERAPY:  Supportive care  Oncology History   Intrahepatic cholangiocarcinoma (HCC) 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 -s/p first line Cisplatin and Gemcitabine, 09/29/19 - 07/19/20. Cisplatin discontinued 03/23/20 due to Afib. -he progressed through single agent gemcitabine (12/24/20 - 03/16/21), FOLFOX plus durvalumab (04/05/21 - 10/21/21), FOLFIRI (11/01/21 - 01/19/22), and then tried ivosidenib (02/02/22 - 04/2022) -he was switched to Stivarga on 05/15/22. Due to disease progression, treatment changed to Lenvatinib and Keytruda in 11/2022, we stopped his treatment in Feb 2024 due to very poor performance status, anorexia, and new abdominal ascites, he has recovered well from last treatment. -There is no other standard treatment available.  Due to his limited performance status, I recommend palliative care and observation for now, he has paracentesis as needed  -we previously reviewed home palliative care and hospice he is not ready and denied it -he has increased ascites and required frequent large volume paracentesis lately  -I discussed the option of pleurx in abdomen for large volume ascites, he declined  -continue supportive care, he is not ready for hospice home care    Assessment and Plan     Intrahepatic cholangiocarcinoma with Ascites -On supportive care only, weekly paracentesis with removal of significant fluid volume. No pain from the cancer, main issue is fluid accumulation. -Continue weekly paracentesis as scheduled.  Gastroesophageal Reflux Disease (GERD) Reports of heartburn. Currently on Protonix and Tums. -Increase Protonix to twice daily as needed.  Weight Loss Noted weight loss and muscle wasting. Eating well but could benefit from increased protein intake. -Encourage high protein, high calorie foods and consider nutritional supplements like Ensure or Boost.  Follow-up in 2 months. Continue to monitor labs, though not drawn today due to error, OK to check next time         SUMMARY OF ONCOLOGIC HISTORY: Oncology History Overview Note  Cancer Staging Intrahepatic cholangiocarcinoma (HCC) Staging form: Intrahepatic Bile Duct, AJCC 8th Edition - Clinical stage from 08/19/2019: Stage II (cT2, cN0, cM0) - Signed by Malachy Mood, MD on 08/19/2019    Intrahepatic cholangiocarcinoma (HCC)  06/28/2019 Imaging   CT AP W Contrast 06/28/19  IMPRESSION: 1. Heterogeneous hypodensity posteriorly in the right hepatic lobe and potentially extending into the caudate lobe suspicious for a mass. There is felt to be truncation of branches of the portal vein in this vicinity and some narrowing of the hepatic vein, as well as triangular-shaped regions of abnormal hypoenhancement posteriorly in the right hepatic lobe likely representing downstream vascular effects. Cannot exclude malignancy such as cholangiocarcinoma or hepatocellular carcinoma, and follow up hepatic protocol MRI with and without contrast is recommended to further characterize. 2. 4 mm right middle lobe pulmonary nodule is likely benign but may merit surveillance. 3. Cholelithiasis. 4.  Aortic Atherosclerosis (ICD10-I70.0). 5. Prostatomegaly. 6. Mild impingement at L3-4 and L4-5.   07/31/2019 Imaging   MRI Liver  07/31/19 IMPRESSION: 1. 7.3 cm  in long axis mass in the right hepatic lobe spanning into the caudate lobe, high suspicion for malignancy such as hepatocellular carcinoma or cholangiocarcinoma. Suspected effacement or occlusion of the right hepatic vein and posterior branches of the right portal vein. Two smaller tumor nodules along the posterior periphery of the dominant mass. Tissue diagnosis is recommended. 2. No findings of pathologic adenopathy or distant metastatic spread. 3. 9 mm gallstone in the gallbladder. There is mild gallbladder wall thickening which may be from nondistention, correlate clinically in assessing for cholecystitis. 4.  Aortic Atherosclerosis (ICD10-I70.0). 5. Mild diffuse hepatic steatosis.   08/11/2019 Initial Biopsy   DIAGNOSIS: 08/11/19  A. LIVER, RIGHT, BIOPSY:  - Adenocarcinoma.   08/18/2019 Imaging   CT Chest 08/18/19  IMPRESSION: 1. Multiple pulmonary nodules largest at approximately 7 mm in the right lower lobe, nonspecific but concerning given findings in the liver. 2. No signs of definitive metastatic disease, also with mildly enlarged upper abdominal lymph nodes as discussed.   Aortic Atherosclerosis (ICD10-I70.0).   08/19/2019 Initial Diagnosis   Intrahepatic cholangiocarcinoma (HCC)   08/19/2019 Cancer Staging   Staging form: Intrahepatic Bile Duct, AJCC 8th Edition - Clinical stage from 08/19/2019: Stage II (cT2, cN0, cM0) - Signed by Malachy Mood, MD on 08/19/2019   09/29/2019 - 07/19/2020 Chemotherapy   Cisplatin and Gemcitabine 2 weeks on/1 week off starting 09/29/19. Cisplatin held from cycle 9 (03/23/20) due to fluid status/Afib. He is now on maintenance Gemcitabine. Held after 07/19/20 to proceed with liver target therapy.     10/09/2019 Imaging   CT AP IMPRESSION: 1. The dominant right hepatic lobe mass is minimally reduced in size compared to prior exams, currently measuring 6.2 by 5.3 cm, previously 6.2 by 5.6 cm. However, there is a new  small hypodense lesion centrally in the right hepatic lobe which is suspicious for a new small focus of tumor. Accordingly this is an overall mixed appearance. 2. Continued hypoenhancement in the liver downstream of the tumor likely attributable to narrowing or occlusion of the right hepatic vein by the tumor. By virtue of its location the tumor wraps around the intrahepatic portion of the IVC. 3. 4 mm right middle lobe pulmonary nodule, stable compared to earliest available comparison of 07/18/2019. Surveillance of the patient's pulmonary nodules is recommended. 4. Other imaging findings of potential clinical significance: Coronary atherosclerosis. Cholelithiasis. Prominent stool throughout the colon favors constipation. Moderate prostatomegaly with heterogeneous enhancement of the prostate gland. Lumbar spondylosis and degenerative disc disease causing mild bilateral foraminal impingement at L3-4 and L4-5.   Aortic Atherosclerosis (ICD10-I70.0).   12/24/2019 Imaging   CT CAP W Contrast  IMPRESSION: 1. Right hepatic lobe mass and adjacent right hepatic lobe nodules appear grossly stable. No evidence of distant metastatic disease. 2. Continued stability of small pulmonary nodules. Recommend attention on follow-up. 3. Cholelithiasis. 4. Enlarged prostate. 5. Aortic atherosclerosis (ICD10-I70.0). Coronary artery calcification.   02/23/2020 Procedure   He had PAC placed on 02/23/20.    04/08/2020 Imaging   CT CAP  IMPRESSION: 1. Stable or minimally decreased size of a very ill-defined, hypodense and somewhat retractile appearing mass of the central right lobe of the liver abutting the inferior vena cava and right portal vein, measuring approximately 5.7 x 5.0 cm, previously 6.2 x 5.2 cm when measured similarly. Findings are consistent with stable or minimally improved cholangiocarcinoma. 2. Small hypodense nodules of the right lobe identified on prior examination are poorly appreciated on  this single phase contrast examination although not grossly changed. Attention  on follow-up. 3. There are new, moderate bilateral pleural effusions and associated atelectasis or consolidation as well as a new small pericardial effusion, nonspecific although generally concerning and suspicious for malignant effusions. There is no directly visualized pleural nodularity. 4. Multiple small pulmonary nodules are stable.  No new nodules. 5. Coronary artery disease. Aortic Atherosclerosis (ICD10-I70.0).   07/06/2020 Imaging   MRI ABD IMPRESSION: 1. Interval decrease in size of the right hepatic lobe lesion in the medial aspect of the segment 7. Findings would suggest a good response to treatment with some contraction of the tumor. 2. No new hepatic lesions. No abdominal adenopathy or metastatic disease. 3. Stable mild intrahepatic biliary dilatation in the right hepatic lobe distal to the lesion. 4. Somewhat tortuous and almost beaded appearance of the hepatic and portal vein radicles. Findings could be radiation related. 5. Cholelithiasis.  CT chest wo contrast IMPRESSION: 1. Persistent/stable moderate-sized bilateral pleural effusions with overlying atelectasis. 2. Stable small right pulmonary nodules. No new or progressive findings. Recommend continued surveillance. 3. No mediastinal or hilar mass or adenopathy. 4. Stable advanced three-vessel coronary artery calcifications. 5. Cholelithiasis. Aortic Atherosclerosis (ICD10-I70.0)   08/11/2020 Procedure   Y90 on 08/11/20 and 08/26/20 with Dr Archer Asa    11/30/2020 Imaging   MRI Abdomen  IMPRESSION: 1. Today's study demonstrates progression of disease with enlarging central lesion in the right lobe of the liver involving portions of segments 7 and 8, now with evidence of some tumor thrombus extension into the intrahepatic portion of the inferior vena cava. This is associated with increasing intrahepatic biliary ductal dilatation in  segment 7 of the liver. 2. In addition, there is some subtle hyperenhancement of the distal common bile duct at and immediately proximally to the level of the ampulla. There is also some subtle delayed enhancement around this region in the pancreatic head. This is of uncertain etiology and significance, but may be inflammatory and currently is not associated with proximal common bile duct dilatation. However, close attention on follow-up imaging is recommended. 3. Cholelithiasis without evidence of acute cholecystitis at this time. 4. Aortic atherosclerosis.     12/24/2020 - 03/21/2021 Chemotherapy   Restart Gemcitabine 2 weeks on/1 week off given disease progression beginning 12/24/20. Discontinued after 03/21/21 due to disease progression in liver.     03/16/2021 Imaging   MRI abdomen  IMPRESSION: No significant change in size of irregular hypovascular mass in the medial right hepatic lobe.   New 1.1 cm hypovascular lesion with peripheral rim enhancement in liver dome, suspicious for metastatic disease.   New small right pleural effusion and mild ascites.   Cholelithiasis, without evidence of cholecystitis or biliary dilatation.   04/05/2021 -  Chemotherapy   Second-line FOLFOX q2weeks starting 04/05/21    04/05/2021 -  Chemotherapy   Immunotherapy with Durvalumab (Imfinzi) q4weeks starting 04/05/21. (in addition to second line FOLFOX)   06/23/2021 Imaging   MRI Abdomen IMPRESSION: 1. No significant change in subcapsular mass of the posterior right lobe of the liver. 2. No significant change in an additional small hyperenhancing lesion of the liver dome, which remains suspicious for a satellite lesion. 3. No new liver lesions. 4. 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 5. Cholelithiasis without evidence of acute cholecystitis. 6. Trace ascites.  CT Chest w/o contrast IMPRESSION: 1. Occasional small pulmonary nodules in the right lung are stable, for example a  6 mm nodule right lower lobe and a 3 mm nodule of the lateral segment right middle lobe. These are nonspecific  and likely benign, incidental sequelae of infection or inflammation, metastatic disease not favored. Attention on follow-up. 2. Previously seen bilateral pleural effusions are resolved. 3. Hypodense lesion of the posterior right lobe of the liver, keeping with known cholangiocarcinoma and better characterized by same day MR. 4. Coronary artery disease.   10/21/2021 Imaging   EXAM: MRI ABDOMEN WITHOUT AND WITH CONTRAST  IMPRESSION: 1. Mild progression of dominant right hepatic lobe mass with involvement of the right portal, hepatic veins and minimal extension into the IVC. 2. Hepatic dome satellite lesion, new or more conspicuous today. 3. Cholelithiasis 4. Decrease in trace perihepatic and perisplenic ascites. 5.  Aortic Atherosclerosis (ICD10-I70.0).   10/21/2021 Imaging   EXAM: CT CHEST WITHOUT CONTRAST  IMPRESSION: 1. Since 06/23/2021, enlargement of a right lower lobe and possible enlargement of a right middle lobe pulmonary nodule. Findings are suspicious for metastatic disease. 2. No thoracic adenopathy. 3. Coronary artery atherosclerosis. Aortic Atherosclerosis (ICD10-I70.0).   11/01/2021 - 01/11/2022 Chemotherapy   Patient is on Treatment Plan : PANCREAS FOLFIRI q14d     01/19/2022 Imaging   EXAM: CT CHEST WITHOUT CONTRAST  IMPRESSION: 1. Continued increase in size of lung nodule within the posterior right lung base which now measures 1 cm and is suspicious for metastatic disease. The remaining lung nodules are stable from 10/21/2021. 2. No thoracic adenopathy. 3. Aortic Atherosclerosis (ICD10-I70.0). Coronary artery calcifications.   01/19/2022 Imaging   EXAM: MRI ABDOMEN WITHOUT AND WITH CONTRAST  IMPRESSION: 1. No significant change in size or appearance of the large heterogeneous enhancing mass area in the right hepatic lobe as described. There is  mild interval enlargement of the adjacent satellite lesion near the dome of the right lobe now measuring 1.9 cm. 2. Redemonstration of a pulmonary nodule in the right lower lobe measuring 9 mm. 3. Small volume ascites. 4. Splenomegaly. 5. Cholelithiasis.   12/08/2022 - 12/08/2022 Chemotherapy   Patient is on Treatment Plan : UTERINE Lenvatinib (20) D1-21 + Pembrolizumab (200) D1 q21d        Discussed the use of AI scribe software for clinical note transcription with the patient, who gave verbal consent to proceed.  History of Present Illness   The patient, with a history of liver cancer, presents for a routine follow-up. He has been undergoing weekly paracentesis for ascites, with approximately 10-12 liters of fluid removed each time. He reports feeling 'pretty good' after the procedures, with no associated pain. He also reports heartburn, which he manages with Tums and Protonix. He denies any pain from the liver cancer itself. He has been eating well, but has lost some weight, likely due to the cancer. He denies any fever or chills, and reports regular bowel movements. He also reports feeling cold often. He has a port, which was flushed today, but no labs were drawn. He has some bruising on his hands and arms from work, and a small bump from hitting a fence with a lawnmower. He denies any swelling or tenderness in the abdomen.         All other systems were reviewed with the patient and are negative.  MEDICAL HISTORY:  Past Medical History:  Diagnosis Date   Arthritis    Diabetes (HCC)    GERD (gastroesophageal reflux disease)    Hyperlipidemia    Hypertension    Intrahepatic cholangiocarcinoma (HCC)     SURGICAL HISTORY: Past Surgical History:  Procedure Laterality Date   APPENDECTOMY  1980   CARDIOVERSION N/A 04/27/2020   Procedure: CARDIOVERSION;  Surgeon: Lars Masson, MD;  Location: [facility] ENDOSCOPY;  Service: Cardiovascular;  Laterality: N/A;   CARDIOVERSION N/A  05/19/2020   Procedure: CARDIOVERSION;  Surgeon: Parke Poisson, MD;  Location: [facility] ENDOSCOPY;  Service: Cardiovascular;  Laterality: N/A;   COLONOSCOPY     IR 3D INDEPENDENT WKST  08/11/2020   IR 3D INDEPENDENT WKST  08/11/2020   IR ANGIOGRAM SELECTIVE EACH ADDITIONAL VESSEL  08/11/2020   IR ANGIOGRAM SELECTIVE EACH ADDITIONAL VESSEL  08/11/2020   IR ANGIOGRAM SELECTIVE EACH ADDITIONAL VESSEL  08/11/2020   IR ANGIOGRAM SELECTIVE EACH ADDITIONAL VESSEL  08/11/2020   IR ANGIOGRAM SELECTIVE EACH ADDITIONAL VESSEL  08/11/2020   IR ANGIOGRAM SELECTIVE EACH ADDITIONAL VESSEL  08/11/2020   IR ANGIOGRAM SELECTIVE EACH ADDITIONAL VESSEL  08/26/2020   IR ANGIOGRAM SELECTIVE EACH ADDITIONAL VESSEL  08/26/2020   IR ANGIOGRAM VISCERAL SELECTIVE  08/11/2020   IR ANGIOGRAM VISCERAL SELECTIVE  08/26/2020   IR EMBO ARTERIAL NOT HEMORR HEMANG INC GUIDE ROADMAPPING  08/11/2020   IR EMBO TUMOR ORGAN ISCHEMIA INFARCT INC GUIDE ROADMAPPING  08/26/2020   IR IMAGING GUIDED PORT INSERTION  02/23/2020   IR PARACENTESIS  01/05/2023   IR PARACENTESIS  01/18/2023   IR PARACENTESIS  01/25/2023   IR PARACENTESIS  02/01/2023   IR PARACENTESIS  02/08/2023   IR PARACENTESIS  02/15/2023   IR PARACENTESIS  02/23/2023   IR PARACENTESIS  03/01/2023   IR PARACENTESIS  03/08/2023   IR PARACENTESIS  03/15/2023   IR PARACENTESIS  03/22/2023   IR PARACENTESIS  03/29/2023   IR PARACENTESIS  04/05/2023   IR PARACENTESIS  04/12/2023   IR PARACENTESIS  04/19/2023   IR PARACENTESIS  04/27/2023   IR PARACENTESIS  05/03/2023   IR PARACENTESIS  05/10/2023   IR PARACENTESIS  05/17/2023   IR PARACENTESIS  05/25/2023   IR PARACENTESIS  05/31/2023   IR PARACENTESIS  06/07/2023   IR PARACENTESIS  06/14/2023   IR PARACENTESIS  06/21/2023   IR PARACENTESIS  06/28/2023   IR PARACENTESIS  07/05/2023   IR PARACENTESIS  07/12/2023   IR PARACENTESIS  07/19/2023   IR PARACENTESIS  07/26/2023   IR PARACENTESIS  08/02/2023   IR PARACENTESIS  08/09/2023   IR RADIOLOGIST EVAL & MGMT   07/14/2020   IR RADIOLOGIST EVAL & MGMT  09/30/2020   IR RADIOLOGIST EVAL & MGMT  12/01/2020   IR US GUIDE VASC ACCESS LEFT  08/26/2020   IR US GUIDE VASC ACCESS RIGHT  08/11/2020    I have reviewed the social history and family history with the patient and they are unchanged from previous note.  ALLERGIES:  has No Known Allergies.  MEDICATIONS:  Current Outpatient Medications  Medication Sig Dispense Refill   allopurinol (ZYLOPRIM) 100 MG tablet Take 100 mg by mouth daily.     apixaban (ELIQUIS) 5 MG TABS tablet TAKE 1 TABLET TWICE DAILY 180 tablet 1   atorvastatin (LIPITOR) 40 MG tablet Take 1 tablet (40 mg total) by mouth daily. Please call [phone] to schedule a January appointment for future refills. Thank you. 90 tablet 0   clindamycin (CLEOCIN) 300 MG capsule Take 1 capsule (300 mg total) by mouth 3 (three) times daily. 21 capsule 0   doxycycline (VIBRAMYCIN) 100 MG capsule Take 1 capsule (100 mg total) by mouth 2 (two) times daily. One po bid x 7 days 14 capsule 0   fenofibrate (TRICOR) 145 MG tablet Take 145 mg by mouth daily.     finasteride (PROSCAR) 5  MG tablet Take 5 mg by mouth daily.     furosemide (LASIX) 40 MG tablet TAKE 2 TABLETS BY MOUTH 2 TIMES DAILY. (Patient taking differently: Take 40 mg by mouth 3 (three) times a week. TAKE ONE TABLET (40 mg) MONDAY, WEDNESDAY, AND FRIDAY.) 360 tablet 3   gabapentin (NEURONTIN) 100 MG capsule Take 100 mg by mouth at bedtime.     hydrALAZINE (APRESOLINE) 50 MG tablet TAKE 1.5 TABLETS (75 MG TOTAL) BY MOUTH 3 (THREE) TIMES DAILY. 135 tablet 1   lidocaine-prilocaine (EMLA) cream Apply 1 Application topically as needed. 30 g 3   metFORMIN (GLUCOPHAGE-XR) 500 MG 24 hr tablet      metoprolol succinate (TOPROL-XL) 50 MG 24 hr tablet TAKE 1 TABLET BY MOUTH DAILY. TAKE WITH OR IMMEDIATELY FOLLOWING A MEAL. 90 tablet 2   olmesartan (BENICAR) 40 MG tablet TAKE 1 TABLET (40 MG TOTAL) BY MOUTH DAILY. 90 tablet 1   ondansetron (ZOFRAN) 8 MG  tablet Take 1 tablet (8 mg total) by mouth every 8 (eight) hours as needed for nausea or vomiting. 30 tablet 1   pantoprazole (PROTONIX) 40 MG tablet Take 1 tablet (40 mg total) by mouth 2 (two) times daily. 180 tablet 2   potassium chloride SA (KLOR-CON M20) 20 MEQ tablet Take 1 tablet (20 mEq total) by mouth daily. 7 tablet 0   sucralfate (CARAFATE) 1 g tablet Take 1 tablet (1 g total) by mouth every 6 (six) hours as needed. Please schedule a yearly follow up for further refills: [phone] 60 tablet 0   tamsulosin (FLOMAX) 0.4 MG CAPS capsule Take 0.4 mg by mouth daily.     No current facility-administered medications for this visit.    PHYSICAL EXAMINATION: ECOG PERFORMANCE STATUS: 2 - Symptomatic, <50% confined to bed  Vitals:   08/13/23 1109  BP: 120/72  Pulse: 62  Resp: 15  Temp: 97.6 F (36.4 C)  SpO2: 100%   Wt Readings from Last 3 Encounters:  08/13/23 175 lb 9.6 oz (79.7 kg)  06/18/23 185 lb 3.2 oz (84 kg)  05/02/23 187 lb 14.4 oz (85.2 kg)     GENERAL:alert, no distress and comfortable SKIN: skin color, texture, turgor are normal, no rashes or significant lesions EYES: normal, Conjunctiva are pink and non-injected, sclera clear ABDOMEN:abdomen soft, distended, with large volume ascites Musculoskeletal:no cyanosis of digits and no clubbing  NEURO: alert & oriented x 3 with fluent speech, no focal motor/sensory deficits  Physical Exam   HEENT: Sclerae not anemic. ABDOMEN: Not tender on palpation. SKIN: Bruises on hands, bump from lawnmower impact, bleeding on arm, legs slightly swollen.      LABORATORY DATA:  I have reviewed the data as listed    Latest Ref Rng & Units 06/18/2023    8:13 AM 05/02/2023    9:46 AM 03/21/2023   11:05 AM  CBC  WBC 4.0 - 10.5 K/uL 7.9  6.1  5.9   Hemoglobin 13.0 - 17.0 g/dL 51.7  61.6  07.3   Hematocrit 39.0 - 52.0 % 35.5  36.8  35.3   Platelets 150 - 400 K/uL 133  108  123         Latest Ref Rng & Units 06/18/2023    8:13  AM 05/02/2023    9:46 AM 04/05/2023    2:50 PM  CMP  Glucose 70 - 99 mg/dL 710  626  86   BUN 8 - 23 mg/dL 37  34  29   Creatinine 0.61 - 1.24  mg/dL 1.61  0.96  0.45   Sodium 135 - 145 mmol/L 131  137  138   Potassium 3.5 - 5.1 mmol/L 4.5  4.3  5.1   Chloride 98 - 111 mmol/L 103  107  102   CO2 22 - 32 mmol/L 20  23  22    Calcium 8.9 - 10.3 mg/dL 9.3  9.3  9.5   Total Protein 6.5 - 8.1 g/dL 6.0  6.4    Total Bilirubin 0.3 - 1.2 mg/dL 0.9  1.1    Alkaline Phos 38 - 126 U/L 44  43    AST 15 - 41 U/L 39  44    ALT 0 - 44 U/L 28  28        RADIOGRAPHIC STUDIES: I have personally reviewed the radiological images as listed and agreed with the findings in the report. No results found.    No orders of the defined types were placed in this encounter.  All questions were answered. The patient knows to call the clinic with any problems, questions or concerns. No barriers to learning was detected. The total time spent in the appointment was 20 minutes.     Malachy Mood, MD 08/13/2023

## 2023-08-16 ENCOUNTER — Ambulatory Visit (HOSPITAL_COMMUNITY)
Admission: RE | Admit: 2023-08-16 | Discharge: 2023-08-16 | Disposition: A | Discharge status: Home / Self Care | Payer: Medicare HMO | Source: Ambulatory Visit | Attending: Hematology | Admitting: Hematology

## 2023-08-16 DIAGNOSIS — R0609 Other forms of dyspnea: Secondary | ICD-10-CM | POA: Insufficient documentation

## 2023-08-16 DIAGNOSIS — R140 Abdominal distension (gaseous): Secondary | ICD-10-CM

## 2023-08-16 DIAGNOSIS — C221 Intrahepatic bile duct carcinoma: Secondary | ICD-10-CM | POA: Diagnosis not present

## 2023-08-16 DIAGNOSIS — R188 Other ascites: Secondary | ICD-10-CM | POA: Insufficient documentation

## 2023-08-16 HISTORY — PX: IR PARACENTESIS: IMG2679

## 2023-08-16 MED ORDER — ALBUMIN HUMAN 25 % IV SOLN
INTRAVENOUS | Status: AC
  Filled 2023-08-16: qty 100

## 2023-08-16 MED ORDER — HEPARIN SOD (PORK) LOCK FLUSH 100 UNIT/ML IV SOLN
500.0000 [IU] | Freq: Once | INTRAVENOUS | Status: AC
  Administered 2023-08-16: 500 [IU] via INTRAVENOUS

## 2023-08-16 MED ORDER — LIDOCAINE HCL 1 % IJ SOLN
INTRAMUSCULAR | Status: AC
  Filled 2023-08-16: qty 20

## 2023-08-16 MED ORDER — ALBUMIN HUMAN 25 % IV SOLN: 25.0000 g | Freq: Once | INTRAVENOUS | Status: DC

## 2023-08-16 MED ORDER — HEPARIN SOD (PORK) LOCK FLUSH 100 UNIT/ML IV SOLN
INTRAVENOUS | Status: AC
  Filled 2023-08-16: qty 5

## 2023-08-16 MED ORDER — ALBUMIN HUMAN 25 % IV SOLN
48.0000 g | Freq: Once | INTRAVENOUS | Status: AC
  Administered 2023-08-16: 50 g via INTRAVENOUS

## 2023-08-16 NOTE — Procedures (Signed)
Pre Procedural Dx: Symptomatic Ascites Post Procedural Dx: Same  Successful US guided paracentesis yielding 9.6 L of serous ascitic fluid.  EBL: None  Complications: None immediate  Katherina Right, MD Pager #: [phone]

## 2023-08-23 ENCOUNTER — Ambulatory Visit (HOSPITAL_COMMUNITY)
Admission: RE | Admit: 2023-08-23 | Discharge: 2023-08-23 | Disposition: A | Discharge status: Home / Self Care | Payer: Medicare HMO | Source: Ambulatory Visit | Attending: Hematology | Admitting: Hematology

## 2023-08-23 DIAGNOSIS — R188 Other ascites: Secondary | ICD-10-CM | POA: Insufficient documentation

## 2023-08-23 DIAGNOSIS — R0609 Other forms of dyspnea: Secondary | ICD-10-CM | POA: Diagnosis not present

## 2023-08-23 DIAGNOSIS — R140 Abdominal distension (gaseous): Secondary | ICD-10-CM

## 2023-08-23 DIAGNOSIS — C221 Intrahepatic bile duct carcinoma: Secondary | ICD-10-CM

## 2023-08-23 HISTORY — PX: IR PARACENTESIS: IMG2679

## 2023-08-23 MED ORDER — ALBUMIN HUMAN 25 % IV SOLN
INTRAVENOUS | Status: AC
  Filled 2023-08-23: qty 200

## 2023-08-23 MED ORDER — LIDOCAINE HCL 1 % IJ SOLN
20.0000 mL | Freq: Once | INTRAMUSCULAR | Status: AC
  Administered 2023-08-23: 10 mL

## 2023-08-23 MED ORDER — ALBUMIN HUMAN 25 % IV SOLN
50.0000 g | Freq: Once | INTRAVENOUS | Status: AC
  Administered 2023-08-23: 50 g via INTRAVENOUS

## 2023-08-23 MED ORDER — HEPARIN SOD (PORK) LOCK FLUSH 100 UNIT/ML IV SOLN
500.0000 [IU] | Freq: Once | INTRAVENOUS | Status: AC
  Administered 2023-08-23: 500 [IU] via INTRAVENOUS

## 2023-08-23 MED ORDER — LIDOCAINE HCL 1 % IJ SOLN
INTRAMUSCULAR | Status: AC
  Filled 2023-08-23: qty 20

## 2023-08-23 MED ORDER — HEPARIN SOD (PORK) LOCK FLUSH 100 UNIT/ML IV SOLN
INTRAVENOUS | Status: AC
  Filled 2023-08-23: qty 5

## 2023-08-23 NOTE — Procedures (Signed)
PROCEDURE SUMMARY:  Successful US guided paracentesis from right lateral abdomen.  Yielded 10.1 liters of clear yellow fluid.  No immediate complications.  Patient tolerated well.  EBL = trace  Kiyah Demartini S Avira Tillison PA-C 08/23/2023 10:55 AM

## 2023-08-25 ENCOUNTER — Other Ambulatory Visit: Payer: Self-pay | Admitting: Cardiovascular Disease

## 2023-08-25 DIAGNOSIS — R0600 Dyspnea, unspecified: Secondary | ICD-10-CM

## 2023-08-27 DIAGNOSIS — J029 Acute pharyngitis, unspecified: Secondary | ICD-10-CM | POA: Diagnosis not present

## 2023-08-27 DIAGNOSIS — R051 Acute cough: Secondary | ICD-10-CM | POA: Diagnosis not present

## 2023-08-27 DIAGNOSIS — R509 Fever, unspecified: Secondary | ICD-10-CM | POA: Diagnosis not present

## 2023-08-27 DIAGNOSIS — R0981 Nasal congestion: Secondary | ICD-10-CM | POA: Diagnosis not present

## 2023-08-27 DIAGNOSIS — B370 Candidal stomatitis: Secondary | ICD-10-CM | POA: Diagnosis not present

## 2023-08-27 DIAGNOSIS — J069 Acute upper respiratory infection, unspecified: Secondary | ICD-10-CM | POA: Diagnosis not present

## 2023-08-27 DIAGNOSIS — R070 Pain in throat: Secondary | ICD-10-CM | POA: Diagnosis not present

## 2023-08-30 ENCOUNTER — Ambulatory Visit (HOSPITAL_COMMUNITY)
Admission: RE | Admit: 2023-08-30 | Discharge: 2023-08-30 | Disposition: A | Discharge status: Home / Self Care | Payer: Medicare HMO | Source: Ambulatory Visit | Attending: Hematology | Admitting: Hematology

## 2023-08-30 DIAGNOSIS — R6510 Systemic inflammatory response syndrome (SIRS) of non-infectious origin without acute organ dysfunction: Secondary | ICD-10-CM | POA: Diagnosis present

## 2023-08-30 DIAGNOSIS — C7800 Secondary malignant neoplasm of unspecified lung: Secondary | ICD-10-CM | POA: Diagnosis not present

## 2023-08-30 DIAGNOSIS — I5032 Chronic diastolic (congestive) heart failure: Secondary | ICD-10-CM | POA: Diagnosis present

## 2023-08-30 DIAGNOSIS — E1122 Type 2 diabetes mellitus with diabetic chronic kidney disease: Secondary | ICD-10-CM | POA: Diagnosis present

## 2023-08-30 DIAGNOSIS — D696 Thrombocytopenia, unspecified: Secondary | ICD-10-CM | POA: Diagnosis present

## 2023-08-30 DIAGNOSIS — K851 Biliary acute pancreatitis without necrosis or infection: Secondary | ICD-10-CM | POA: Diagnosis not present

## 2023-08-30 DIAGNOSIS — Z7189 Other specified counseling: Secondary | ICD-10-CM | POA: Diagnosis not present

## 2023-08-30 DIAGNOSIS — M109 Gout, unspecified: Secondary | ICD-10-CM | POA: Diagnosis present

## 2023-08-30 DIAGNOSIS — E43 Unspecified severe protein-calorie malnutrition: Secondary | ICD-10-CM | POA: Diagnosis present

## 2023-08-30 DIAGNOSIS — C7801 Secondary malignant neoplasm of right lung: Secondary | ICD-10-CM | POA: Diagnosis present

## 2023-08-30 DIAGNOSIS — I130 Hypertensive heart and chronic kidney disease with heart failure and stage 1 through stage 4 chronic kidney disease, or unspecified chronic kidney disease: Secondary | ICD-10-CM | POA: Diagnosis present

## 2023-08-30 DIAGNOSIS — R338 Other retention of urine: Secondary | ICD-10-CM | POA: Diagnosis present

## 2023-08-30 DIAGNOSIS — K859 Acute pancreatitis without necrosis or infection, unspecified: Secondary | ICD-10-CM | POA: Diagnosis present

## 2023-08-30 DIAGNOSIS — C7802 Secondary malignant neoplasm of left lung: Secondary | ICD-10-CM | POA: Diagnosis present

## 2023-08-30 DIAGNOSIS — N183 Chronic kidney disease, stage 3 unspecified: Secondary | ICD-10-CM | POA: Diagnosis present

## 2023-08-30 DIAGNOSIS — R1013 Epigastric pain: Secondary | ICD-10-CM | POA: Diagnosis present

## 2023-08-30 DIAGNOSIS — C221 Intrahepatic bile duct carcinoma: Secondary | ICD-10-CM | POA: Diagnosis present

## 2023-08-30 DIAGNOSIS — Z66 Do not resuscitate: Secondary | ICD-10-CM | POA: Diagnosis present

## 2023-08-30 DIAGNOSIS — Z87891 Personal history of nicotine dependence: Secondary | ICD-10-CM | POA: Diagnosis not present

## 2023-08-30 DIAGNOSIS — R64 Cachexia: Secondary | ICD-10-CM | POA: Diagnosis present

## 2023-08-30 DIAGNOSIS — K219 Gastro-esophageal reflux disease without esophagitis: Secondary | ICD-10-CM | POA: Diagnosis present

## 2023-08-30 DIAGNOSIS — K7460 Unspecified cirrhosis of liver: Secondary | ICD-10-CM | POA: Diagnosis not present

## 2023-08-30 DIAGNOSIS — N179 Acute kidney failure, unspecified: Secondary | ICD-10-CM | POA: Diagnosis present

## 2023-08-30 DIAGNOSIS — K8020 Calculus of gallbladder without cholecystitis without obstruction: Secondary | ICD-10-CM | POA: Diagnosis not present

## 2023-08-30 DIAGNOSIS — Z7901 Long term (current) use of anticoagulants: Secondary | ICD-10-CM | POA: Diagnosis not present

## 2023-08-30 DIAGNOSIS — E785 Hyperlipidemia, unspecified: Secondary | ICD-10-CM | POA: Diagnosis present

## 2023-08-30 DIAGNOSIS — C761 Malignant neoplasm of thorax: Secondary | ICD-10-CM | POA: Diagnosis not present

## 2023-08-30 DIAGNOSIS — Z515 Encounter for palliative care: Secondary | ICD-10-CM | POA: Diagnosis not present

## 2023-08-30 DIAGNOSIS — I4821 Permanent atrial fibrillation: Secondary | ICD-10-CM | POA: Diagnosis present

## 2023-08-30 DIAGNOSIS — R188 Other ascites: Secondary | ICD-10-CM | POA: Insufficient documentation

## 2023-08-30 DIAGNOSIS — R180 Malignant ascites: Secondary | ICD-10-CM | POA: Diagnosis present

## 2023-08-30 DIAGNOSIS — C229 Malignant neoplasm of liver, not specified as primary or secondary: Secondary | ICD-10-CM | POA: Diagnosis not present

## 2023-08-30 DIAGNOSIS — E872 Acidosis, unspecified: Secondary | ICD-10-CM | POA: Diagnosis present

## 2023-08-30 HISTORY — PX: IR PARACENTESIS: IMG2679

## 2023-08-30 MED ORDER — ALBUMIN HUMAN 25 % IV SOLN
INTRAVENOUS | Status: AC
  Filled 2023-08-30: qty 200

## 2023-08-30 MED ORDER — HEPARIN SOD (PORK) LOCK FLUSH 100 UNIT/ML IV SOLN
INTRAVENOUS | Status: AC
  Filled 2023-08-30: qty 5

## 2023-08-30 MED ORDER — HEPARIN SOD (PORK) LOCK FLUSH 100 UNIT/ML IV SOLN
500.0000 [IU] | Freq: Once | INTRAVENOUS | Status: AC
  Administered 2023-08-30: 500 [IU] via INTRAVENOUS

## 2023-08-30 MED ORDER — ALBUMIN HUMAN 25 % IV SOLN
50.0000 g | Freq: Once | INTRAVENOUS | Status: AC
  Administered 2023-08-30: 50 g via INTRAVENOUS

## 2023-08-30 MED ORDER — LIDOCAINE HCL 1 % IJ SOLN
INTRAMUSCULAR | Status: AC
  Filled 2023-08-30: qty 20

## 2023-08-30 NOTE — Procedures (Signed)
PROCEDURE SUMMARY:  Successful image-guided paracentesis from the right lower abdomen.  Yielded 8.1 liters of clear yellow fluid.  No immediate complications.  EBL = trace. Patient tolerated well.   Specimen was not sent for labs.  Please see imaging section of Epic for full dictation.   Kennieth Francois PA-C 08/30/2023 3:06 PM

## 2023-09-01 ENCOUNTER — Other Ambulatory Visit: Payer: Self-pay

## 2023-09-01 ENCOUNTER — Inpatient Hospital Stay (HOSPITAL_COMMUNITY)
Admission: EM | Admit: 2023-09-01 | Discharge: 2023-09-05 | DRG: 438 | Disposition: A | Discharge status: Hospice | Payer: Medicare HMO | Attending: Obstetrics and Gynecology | Admitting: Obstetrics and Gynecology

## 2023-09-01 ENCOUNTER — Emergency Department (HOSPITAL_COMMUNITY): Payer: Medicare HMO

## 2023-09-01 ENCOUNTER — Encounter (HOSPITAL_COMMUNITY): Payer: Self-pay | Admitting: *Deleted

## 2023-09-01 DIAGNOSIS — E785 Hyperlipidemia, unspecified: Secondary | ICD-10-CM | POA: Diagnosis present

## 2023-09-01 DIAGNOSIS — K851 Biliary acute pancreatitis without necrosis or infection: Secondary | ICD-10-CM | POA: Diagnosis not present

## 2023-09-01 DIAGNOSIS — C7800 Secondary malignant neoplasm of unspecified lung: Secondary | ICD-10-CM

## 2023-09-01 DIAGNOSIS — K219 Gastro-esophageal reflux disease without esophagitis: Secondary | ICD-10-CM | POA: Diagnosis present

## 2023-09-01 DIAGNOSIS — I5032 Chronic diastolic (congestive) heart failure: Secondary | ICD-10-CM | POA: Diagnosis present

## 2023-09-01 DIAGNOSIS — Z823 Family history of stroke: Secondary | ICD-10-CM

## 2023-09-01 DIAGNOSIS — E1122 Type 2 diabetes mellitus with diabetic chronic kidney disease: Secondary | ICD-10-CM | POA: Diagnosis present

## 2023-09-01 DIAGNOSIS — Z6827 Body mass index (BMI) 27.0-27.9, adult: Secondary | ICD-10-CM

## 2023-09-01 DIAGNOSIS — Z9221 Personal history of antineoplastic chemotherapy: Secondary | ICD-10-CM

## 2023-09-01 DIAGNOSIS — Z8249 Family history of ischemic heart disease and other diseases of the circulatory system: Secondary | ICD-10-CM

## 2023-09-01 DIAGNOSIS — E43 Unspecified severe protein-calorie malnutrition: Secondary | ICD-10-CM | POA: Diagnosis present

## 2023-09-01 DIAGNOSIS — Z66 Do not resuscitate: Secondary | ICD-10-CM | POA: Diagnosis present

## 2023-09-01 DIAGNOSIS — Z7984 Long term (current) use of oral hypoglycemic drugs: Secondary | ICD-10-CM

## 2023-09-01 DIAGNOSIS — K859 Acute pancreatitis without necrosis or infection, unspecified: Principal | ICD-10-CM | POA: Diagnosis present

## 2023-09-01 DIAGNOSIS — C7801 Secondary malignant neoplasm of right lung: Secondary | ICD-10-CM | POA: Diagnosis present

## 2023-09-01 DIAGNOSIS — M109 Gout, unspecified: Secondary | ICD-10-CM | POA: Diagnosis present

## 2023-09-01 DIAGNOSIS — C7802 Secondary malignant neoplasm of left lung: Secondary | ICD-10-CM | POA: Diagnosis present

## 2023-09-01 DIAGNOSIS — N183 Chronic kidney disease, stage 3 unspecified: Secondary | ICD-10-CM | POA: Diagnosis present

## 2023-09-01 DIAGNOSIS — Z79899 Other long term (current) drug therapy: Secondary | ICD-10-CM

## 2023-09-01 DIAGNOSIS — E872 Acidosis, unspecified: Secondary | ICD-10-CM | POA: Diagnosis present

## 2023-09-01 DIAGNOSIS — Z87891 Personal history of nicotine dependence: Secondary | ICD-10-CM | POA: Diagnosis not present

## 2023-09-01 DIAGNOSIS — R180 Malignant ascites: Secondary | ICD-10-CM | POA: Diagnosis present

## 2023-09-01 DIAGNOSIS — I4821 Permanent atrial fibrillation: Secondary | ICD-10-CM | POA: Diagnosis present

## 2023-09-01 DIAGNOSIS — R64 Cachexia: Secondary | ICD-10-CM | POA: Diagnosis present

## 2023-09-01 DIAGNOSIS — Z7901 Long term (current) use of anticoagulants: Secondary | ICD-10-CM | POA: Diagnosis not present

## 2023-09-01 DIAGNOSIS — N401 Enlarged prostate with lower urinary tract symptoms: Secondary | ICD-10-CM | POA: Diagnosis present

## 2023-09-01 DIAGNOSIS — N179 Acute kidney failure, unspecified: Secondary | ICD-10-CM | POA: Diagnosis present

## 2023-09-01 DIAGNOSIS — D696 Thrombocytopenia, unspecified: Secondary | ICD-10-CM | POA: Diagnosis present

## 2023-09-01 DIAGNOSIS — R1013 Epigastric pain: Principal | ICD-10-CM

## 2023-09-01 DIAGNOSIS — C221 Intrahepatic bile duct carcinoma: Secondary | ICD-10-CM | POA: Diagnosis present

## 2023-09-01 DIAGNOSIS — I130 Hypertensive heart and chronic kidney disease with heart failure and stage 1 through stage 4 chronic kidney disease, or unspecified chronic kidney disease: Secondary | ICD-10-CM | POA: Diagnosis present

## 2023-09-01 DIAGNOSIS — K8020 Calculus of gallbladder without cholecystitis without obstruction: Secondary | ICD-10-CM | POA: Diagnosis present

## 2023-09-01 DIAGNOSIS — I2510 Atherosclerotic heart disease of native coronary artery without angina pectoris: Secondary | ICD-10-CM | POA: Diagnosis present

## 2023-09-01 DIAGNOSIS — Z7189 Other specified counseling: Secondary | ICD-10-CM

## 2023-09-01 DIAGNOSIS — R6510 Systemic inflammatory response syndrome (SIRS) of non-infectious origin without acute organ dysfunction: Secondary | ICD-10-CM | POA: Diagnosis present

## 2023-09-01 DIAGNOSIS — R001 Bradycardia, unspecified: Secondary | ICD-10-CM | POA: Diagnosis present

## 2023-09-01 DIAGNOSIS — Z515 Encounter for palliative care: Secondary | ICD-10-CM | POA: Diagnosis not present

## 2023-09-01 DIAGNOSIS — R338 Other retention of urine: Secondary | ICD-10-CM | POA: Diagnosis present

## 2023-09-01 LAB — COMPREHENSIVE METABOLIC PANEL
ALT: 35 U/L (ref 0–44)
AST: 48 U/L — ABNORMAL HIGH (ref 15–41)
Albumin: 4.2 g/dL (ref 3.5–5.0)
Alkaline Phosphatase: 50 U/L (ref 38–126)
Anion gap: 14 (ref 5–15)
BUN: 42 mg/dL — ABNORMAL HIGH (ref 8–23)
CO2: 19 mmol/L — ABNORMAL LOW (ref 22–32)
Calcium: 10.5 mg/dL — ABNORMAL HIGH (ref 8.9–10.3)
Chloride: 98 mmol/L (ref 98–111)
Creatinine, Ser: 2.51 mg/dL — ABNORMAL HIGH (ref 0.61–1.24)
GFR, Estimated: 26 mL/min — ABNORMAL LOW (ref >60)
Glucose, Bld: 135 mg/dL — ABNORMAL HIGH (ref 70–99)
Potassium: 4.8 mmol/L (ref 3.5–5.1)
Sodium: 131 mmol/L — ABNORMAL LOW (ref 135–145)
Total Bilirubin: 1.7 mg/dL — ABNORMAL HIGH (ref 0.3–1.2)
Total Protein: 7.2 g/dL (ref 6.5–8.1)

## 2023-09-01 LAB — CBC
HCT: 43.3 % (ref 39.0–52.0)
Hemoglobin: 14.9 g/dL (ref 13.0–17.0)
MCH: 31.6 pg (ref 26.0–34.0)
MCHC: 34.4 g/dL (ref 30.0–36.0)
MCV: 91.7 fL (ref 80.0–100.0)
Platelets: 208 10*3/uL (ref 150–400)
RBC: 4.72 MIL/uL (ref 4.22–5.81)
RDW: 15.5 % (ref 11.5–15.5)
WBC: 18 10*3/uL — ABNORMAL HIGH (ref 4.0–10.5)
nRBC: 0 % (ref 0.0–0.2)

## 2023-09-01 LAB — URINALYSIS, ROUTINE W REFLEX MICROSCOPIC
Bilirubin Urine: NEGATIVE
Glucose, UA: NEGATIVE mg/dL
Hgb urine dipstick: NEGATIVE
Ketones, ur: 5 mg/dL — AB
Leukocytes,Ua: NEGATIVE
Nitrite: NEGATIVE
Protein, ur: NEGATIVE mg/dL
Specific Gravity, Urine: 1.018 (ref 1.005–1.030)
pH: 5 (ref 5.0–8.0)

## 2023-09-01 LAB — CBG MONITORING, ED: Glucose-Capillary: 124 mg/dL — ABNORMAL HIGH (ref 70–99)

## 2023-09-01 LAB — LIPASE, BLOOD: Lipase: 544 U/L — ABNORMAL HIGH (ref 11–51)

## 2023-09-01 MED ORDER — OXYCODONE HCL 5 MG PO TABS
7.5000 mg | ORAL_TABLET | Freq: Four times a day (QID) | ORAL | Status: DC | PRN
  Administered 2023-09-03 – 2023-09-04 (×2): 7.5 mg via ORAL
  Filled 2023-09-01: qty 2

## 2023-09-01 MED ORDER — ONDANSETRON HCL 4 MG/2ML IJ SOLN
4.0000 mg | Freq: Four times a day (QID) | INTRAMUSCULAR | Status: DC | PRN
  Administered 2023-09-01: 4 mg via INTRAVENOUS
  Filled 2023-09-01: qty 2

## 2023-09-01 MED ORDER — GABAPENTIN 100 MG PO CAPS
100.0000 mg | ORAL_CAPSULE | Freq: Every day | ORAL | Status: DC
  Administered 2023-09-01 – 2023-09-04 (×4): 100 mg via ORAL
  Filled 2023-09-01 (×4): qty 1

## 2023-09-01 MED ORDER — OXYCODONE-ACETAMINOPHEN 5-325 MG PO TABS
1.0000 | ORAL_TABLET | Freq: Once | ORAL | Status: AC
  Administered 2023-09-01: 1 via ORAL
  Filled 2023-09-01: qty 1

## 2023-09-01 MED ORDER — SODIUM CHLORIDE 0.9 % IV BOLUS
500.0000 mL | Freq: Once | INTRAVENOUS | Status: AC
  Administered 2023-09-01: 500 mL via INTRAVENOUS

## 2023-09-01 MED ORDER — ACETAMINOPHEN 500 MG PO TABS
1000.0000 mg | ORAL_TABLET | Freq: Four times a day (QID) | ORAL | Status: DC | PRN
  Administered 2023-09-01: 1000 mg via ORAL
  Filled 2023-09-01: qty 2

## 2023-09-01 MED ORDER — INSULIN ASPART 100 UNIT/ML IJ SOLN: 0.0000 [IU] | Freq: Three times a day (TID) | INTRAMUSCULAR | Status: DC

## 2023-09-01 MED ORDER — PANTOPRAZOLE SODIUM 40 MG PO TBEC
40.0000 mg | DELAYED_RELEASE_TABLET | Freq: Two times a day (BID) | ORAL | Status: DC
  Administered 2023-09-01 – 2023-09-05 (×8): 40 mg via ORAL
  Filled 2023-09-01 (×8): qty 1

## 2023-09-01 MED ORDER — TAMSULOSIN HCL 0.4 MG PO CAPS
0.4000 mg | ORAL_CAPSULE | Freq: Every day | ORAL | Status: DC
  Administered 2023-09-02 – 2023-09-05 (×4): 0.4 mg via ORAL
  Filled 2023-09-01 (×4): qty 1

## 2023-09-01 MED ORDER — OXYCODONE HCL 5 MG PO TABS
5.0000 mg | ORAL_TABLET | Freq: Once | ORAL | Status: AC
  Administered 2023-09-01: 5 mg via ORAL
  Filled 2023-09-01: qty 1

## 2023-09-01 MED ORDER — SODIUM CHLORIDE 0.9 % IV SOLN: INTRAVENOUS | Status: AC

## 2023-09-01 MED ORDER — OXYCODONE HCL 5 MG PO TABS
5.0000 mg | ORAL_TABLET | Freq: Four times a day (QID) | ORAL | Status: DC | PRN
  Filled 2023-09-01 (×3): qty 1

## 2023-09-01 MED ORDER — ALLOPURINOL 100 MG PO TABS
100.0000 mg | ORAL_TABLET | Freq: Every day | ORAL | Status: DC
  Administered 2023-09-02 – 2023-09-05 (×4): 100 mg via ORAL
  Filled 2023-09-01 (×4): qty 1

## 2023-09-01 MED ORDER — HYDROMORPHONE HCL 1 MG/ML IJ SOLN
0.5000 mg | INTRAMUSCULAR | Status: DC | PRN
  Administered 2023-09-02 – 2023-09-03 (×4): 0.5 mg via INTRAVENOUS
  Filled 2023-09-01 (×4): qty 0.5

## 2023-09-01 MED ORDER — LACTATED RINGERS IV BOLUS
500.0000 mL | Freq: Once | INTRAVENOUS | Status: AC
  Administered 2023-09-01: 500 mL via INTRAVENOUS

## 2023-09-01 MED ORDER — METOPROLOL SUCCINATE ER 50 MG PO TB24
50.0000 mg | ORAL_TABLET | Freq: Every day | ORAL | Status: DC
  Administered 2023-09-02 – 2023-09-04 (×3): 50 mg via ORAL
  Filled 2023-09-01 (×4): qty 1

## 2023-09-01 NOTE — ED Triage Notes (Signed)
The pt has liver cancer and he had fluid taken off this past Thursday  .since then he has had severe chest and abdomen pain no appetite  no temp

## 2023-09-01 NOTE — ED Notes (Signed)
Patient transported to CT 

## 2023-09-01 NOTE — ED Provider Notes (Signed)
[place] EMERGENCY DEPARTMENT AT [facility] Provider Note   CSN: [id number] Arrival date & time: 09/01/23  1545     History  Chief Complaint  Patient presents with   Hepatic Cancer   Chest Pain    Brandon Sherman is a 77 y.o. male.   Chest Pain  77 year old male with past medical history of hypertension, hyperlipidemia, liver cholangiocarcinoma, atrial fibrillation on Eliquis, CAD, CHF presenting for evaluation of chest pain and abdominal pain.  Patient has weekly paracentesis completed.  The last ones completed on Thursday, 08/30/2023.  He states that following this procedure he has had severe sharp abdominal pain in his epigastric region.  He also states that he has had decreased appetite and has felt sick to his stomach.  He has never had this before after paracentesis.  Denies any fevers, shortness of breath, pain with urination, urinary frequency.  No new changes in medications.  He says his last bowel movement was yesterday and normal for him.  Denies any vomiting.     Home Medications Prior to Admission medications   Medication Sig Start Date End Date Taking? Authorizing Provider  allopurinol (ZYLOPRIM) 100 MG tablet Take 100 mg by mouth daily.    [provider]  apixaban (ELIQUIS) 5 MG TABS tablet TAKE 1 TABLET TWICE DAILY 07/27/23   Runell Gess, MD  atorvastatin (LIPITOR) 40 MG tablet Take 1 tablet (40 mg total) by mouth daily. Please call [phone] to schedule a January appointment for future refills. Thank you. 07/24/23   Runell Gess, MD  clindamycin (CLEOCIN) 300 MG capsule Take 1 capsule (300 mg total) by mouth 3 (three) times daily. 01/29/23   Charlynne Pander, MD  doxycycline (VIBRAMYCIN) 100 MG capsule Take 1 capsule (100 mg total) by mouth 2 (two) times daily. One po bid x 7 days 01/29/23   Charlynne Pander, MD  fenofibrate (TRICOR) 145 MG tablet Take 145 mg by mouth daily.    [provider]  finasteride (PROSCAR) 5  MG tablet Take 5 mg by mouth daily.    [provider]  furosemide (LASIX) 40 MG tablet TAKE 2 TABLETS BY MOUTH TWICE A DAY 08/27/23   Runell Gess, MD  gabapentin (NEURONTIN) 100 MG capsule Take 100 mg by mouth at bedtime.    [provider]  hydrALAZINE (APRESOLINE) 50 MG tablet TAKE 1.5 TABLETS (75 MG TOTAL) BY MOUTH 3 (THREE) TIMES DAILY. 06/25/23   Runell Gess, MD  lidocaine-prilocaine (EMLA) cream Apply 1 Application topically as needed. 03/21/23   Malachy Mood, MD  metFORMIN (GLUCOPHAGE-XR) 500 MG 24 hr tablet  07/14/21   [provider]  metoprolol succinate (TOPROL-XL) 50 MG 24 hr tablet TAKE 1 TABLET BY MOUTH EVERY DAY WITH OR IMMEDIATELY FOLLOWING A MEAL 08/27/23   Runell Gess, MD  olmesartan (BENICAR) 40 MG tablet TAKE 1 TABLET (40 MG TOTAL) BY MOUTH DAILY. 07/10/23   Runell Gess, MD  ondansetron (ZOFRAN) 8 MG tablet Take 1 tablet (8 mg total) by mouth every 8 (eight) hours as needed for nausea or vomiting. 09/11/22   Malachy Mood, MD  pantoprazole (PROTONIX) 40 MG tablet Take 1 tablet (40 mg total) by mouth 2 (two) times daily. 06/18/23   Malachy Mood, MD  potassium chloride SA (KLOR-CON M20) 20 MEQ tablet Take 1 tablet (20 mEq total) by mouth daily. 11/28/22   Malachy Mood, MD  sucralfate (CARAFATE) 1 g tablet Take 1 tablet (1 g total) by  mouth every 6 (six) hours as needed. Please schedule a yearly follow up for further refills: [phone] 06/15/20   Benancio Deeds, MD  tamsulosin (FLOMAX) 0.4 MG CAPS capsule Take 0.4 mg by mouth daily.    [provider]      Allergies    Patient has no known allergies.    Review of Systems   Review of Systems  Cardiovascular:  Positive for chest pain.    Physical Exam Updated Vital Signs BP 135/86   Pulse 70   Temp 97.8 F (36.6 C) (Oral)   Resp 16   Ht 5\' 7"  (1.702 m)   Wt 79.7 kg   SpO2 100%   BMI 27.52 kg/m  Physical Exam Constitutional:      General: He is not in acute distress.     Appearance: He is not ill-appearing.  HENT:     Head: Normocephalic.  Eyes:     Pupils: Pupils are equal, round, and reactive to light.  Cardiovascular:     Rate and Rhythm: Normal rate and regular rhythm.     Pulses:          Radial pulses are 2+ on the right side and 2+ on the left side.     Heart sounds: Normal heart sounds.  Pulmonary:     Effort: Pulmonary effort is normal. No respiratory distress.     Breath sounds: No decreased breath sounds or wheezing.  Chest:     Chest wall: No mass or tenderness.  Abdominal:     Palpations: Abdomen is soft.     Tenderness: There is no guarding or rebound.     Comments: Generalized epigastric tenderness to palpation  Musculoskeletal:     Right lower leg: No edema.     Left lower leg: No edema.  Skin:    General: Skin is warm and dry.     Findings: No rash.  Neurological:     General: No focal deficit present.     Mental Status: He is alert and oriented to person, place, and time.     ED Results / Procedures / Treatments   Labs (all labs ordered are listed, but only abnormal results are displayed) Labs Reviewed  LIPASE, BLOOD - Abnormal; Notable for the following components:      Result Value   Lipase 544 (*)    All other components within normal limits  COMPREHENSIVE METABOLIC PANEL - Abnormal; Notable for the following components:   Sodium 131 (*)    CO2 19 (*)    Glucose, Bld 135 (*)    BUN 42 (*)    Creatinine, Ser 2.51 (*)    Calcium 10.5 (*)    AST 48 (*)    Total Bilirubin 1.7 (*)    GFR, Estimated 26 (*)    All other components within normal limits  CBC - Abnormal; Notable for the following components:   WBC 18.0 (*)    All other components within normal limits  URINALYSIS, ROUTINE W REFLEX MICROSCOPIC - Abnormal; Notable for the following components:   Color, Urine AMBER (*)    APPearance HAZY (*)    Ketones, ur 5 (*)    All other components within normal limits  CBG MONITORING, ED - Abnormal; Notable for  the following components:   Glucose-Capillary 124 (*)    All other components within normal limits    EKG None  Radiology CT ABDOMEN PELVIS WO CONTRAST  Result Date: 09/01/2023 CLINICAL DATA:  Epigastric pain,  recent paracentesis. History of liver cancer. Severe chest and abdominal pain. EXAM: CT ABDOMEN AND PELVIS WITHOUT CONTRAST TECHNIQUE: Multidetector CT imaging of the abdomen and pelvis was performed following the standard protocol without IV contrast. RADIATION DOSE REDUCTION: This exam was performed according to the departmental dose-optimization program which includes automated exposure control, adjustment of the mA and/or kV according to patient size and/or use of iterative reconstruction technique. COMPARISON:  04/08/2020, 11/21/2022. FINDINGS: Lower chest: The heart is normal in size and coronary artery calcifications are noted. The distal tip of a central venous catheter terminates in the right atrium. Scattered pulmonary nodules are noted at the lung bases, the largest measuring 2.2 cm in the right lower lobe, axial image 15. Hepatobiliary: Multiple hypodense nodules and masses are present in the liver measuring up to 5.2 cm in the anterior right lobe of the liver. The right lobe of the liver is atrophied with a nodular contour. Stones are present within the gallbladder. No biliary ductal dilatation. Pancreas: Unremarkable. No pancreatic ductal dilatation or surrounding inflammatory changes. Spleen: Normal in size without focal abnormality. Adrenals/Urinary Tract: The adrenal glands are within normal limits. No renal calculus or hydronephrosis. The bladder is unremarkable. Stomach/Bowel: Stomach is within normal limits. Appendix is not seen. No evidence of bowel wall thickening, distention, or inflammatory changes. No free air or pneumatosis. Vascular/Lymphatic: Aortic atherosclerosis. No enlarged abdominal or pelvic lymph nodes. Reproductive: Prostate gland is mildly enlarged. Other: Large  ascites is noted. Musculoskeletal: Degenerative changes are present in the thoracolumbar spine. No acute osseous abnormality. IMPRESSION: 1. No acute intra-abdominal process. 2. Morphologic changes of cirrhosis with multiple hypodense masses in the liver measuring up to 5.2 cm, bowel or evaluated on prior MRI. 3. Scattered pulmonary nodules at the lung bases measuring up to 2.2 cm, increased from 1.4 cm on the previous exam and concerning for metastatic disease. 4. Large ascites. 5. Aortic atherosclerosis. Electronically Signed   By: Thornell Sartorius M.D.   On: 09/01/2023 20:19   DG Chest 2 View  Result Date: 09/01/2023 CLINICAL DATA:  chest pain liver cancer EXAM: CHEST - 2 VIEW COMPARISON:  CT Chest 11/21/22 FINDINGS: Right-sided chest port in place. No pleural effusion. No pneumothorax. Unchanged cardiac and mediastinal contours. No focal airspace opacity. No radiographically apparent acute displaced rib fractures. Visualized upper abdomen is unremarkable. Vertebral body heights are maintained. IMPRESSION: No focal airspace opacity.  No pleural effusion.  No pneumothorax. Electronically Signed   By: Lorenza Cambridge M.D.   On: 09/01/2023 17:55    Procedures Procedures    Medications Ordered in ED Medications  oxyCODONE-acetaminophen (PERCOCET/ROXICET) 5-325 MG per tablet 1 tablet (1 tablet Oral Given 09/01/23 1630)  oxyCODONE (Oxy IR/ROXICODONE) immediate release tablet 5 mg (5 mg Oral Given 09/01/23 1939)  lactated ringers bolus 500 mL (0 mLs Intravenous Stopped 09/01/23 2011)    ED Course/ Medical Decision Making/ A&P Clinical Course as of 09/01/23 2123  Sat Sep 01, 2023  1702 Stable 77 YOM with ELSD 2/2 malignancy. Epigastric pain this week. [CC]    Clinical Course User Index [CC] Glyn Ade, MD                                Medical Decision Making Amount and/or Complexity of Data Reviewed Radiology: ordered.  Risk Prescription drug management. Decision regarding  hospitalization.   77 year old male with past medical history and HPI as above.  On arrival, vital signs are stable  patient is in no acute distress.  He does have mild epigastric tenderness to palpation on examination, but abdomen overall is soft without significant distention.  No peritonitic signs.  Abdominal pain following recent instrumentation (paracentesis) raises concern for possible perforation versus introduction of infection or SBP-type illness. Do not suspect UTI, pyelonephritis, or nephrolithiasis as no urinary symptoms or bloody urine.  No report of traumatic or toxic ingestion event, no history of diabetes, exam without dehydration, therefore not likely DKA.  No blood in stool to suggest IBD, and given abdominal exam without peritonitis or involuntary guarding, doubt appendicitis, intussusception, volvulus, or bowel obstruction.  Patient is passing gas at this time.  Last BM yesterday (normal for him).   Exam does not reveal hernia, do not suspect incarcerated or strangulated hernia at this time. No hypoxia and no respiratory symptoms concerning for PNA or URI.   Labs obtained. Lipase 544. WBC 18, up from 7.9 two months ago.   CT scan of abdomen and pelvis ordered.  This study was done without contrast given low GFR.  No acute intra-abdominal process was seen.  Again seen were cirrhotic changes of the liver as well as scattered pulmonary nodules.  Patient will require admission to the hospital for pain control and IV fluid resuscitation in the setting of likely pancreatitis.  Inpatient team can consider further diagnostic studies such as peritoneal fluid, however, do not feel that this is necessary within the emergency department he has been afebrile with no signs of confusion or altered mental status. Handoff provided to inpatient team.    The plan for this patient was discussed with Dr. Doran Durand, who voiced agreement and who oversaw evaluation and treatment of this patient.      Final Clinical Impression(s) / ED Diagnoses Final diagnoses:  Epigastric pain    Rx / DC Orders ED Discharge Orders     None         Lyman Speller, MD 09/01/23 2124    Glyn Ade, MD 09/01/23 2309

## 2023-09-01 NOTE — ED Triage Notes (Signed)
No chemo since january

## 2023-09-01 NOTE — ED Notes (Signed)
Pt has a porta cath

## 2023-09-01 NOTE — ED Notes (Signed)
ED TO INPATIENT HANDOFF REPORT  ED Nurse Name and Phone #: Jaquelyn Bitter [phone]  S Name/Age/Gender Brandon Sherman 77 y.o. male Room/Bed: 015C/015C  Code Status   Code Status: Prior  Home/SNF/Other Home Patient oriented to: self, place, time, and situation Is this baseline? Yes   Triage Complete: Triage complete  Chief Complaint Acute pancreatitis [K85.90]  Triage Note The pt has liver cancer and he had fluid taken off this past Thursday  .since then he has had severe chest and abdomen pain no appetite  no temp  No chemo since january   Allergies No Known Allergies  Level of Care/Admitting Diagnosis ED Disposition     ED Disposition  Admit   Condition  --   Comment  Hospital Area: [facility] [[id number]]  Level of Care: Med-Surg [16]  May admit patient to [facility] or [facility] if equivalent level of care is available:: No  Covid Evaluation: Asymptomatic - no recent exposure (last 10 days) testing not required  Diagnosis: Acute pancreatitis [577.0.ICD-9-CM]  Admitting Physician: Dolly Rias [[id number]]  Attending Physician: Dolly Rias [[id number]]  Certification:: I certify this patient will need inpatient services for at least 2 midnights  Expected Medical Readiness: 09/03/2023          B Medical/Surgery History Past Medical History:  Diagnosis Date   Arthritis    Diabetes (HCC)    GERD (gastroesophageal reflux disease)    Hyperlipidemia    Hypertension    Intrahepatic cholangiocarcinoma (HCC)    Past Surgical History:  Procedure Laterality Date   APPENDECTOMY  1980   CARDIOVERSION N/A 04/27/2020   Procedure: CARDIOVERSION;  Surgeon: Lars Masson, MD;  Location: [facility] ENDOSCOPY;  Service: Cardiovascular;  Laterality: N/A;   CARDIOVERSION N/A 05/19/2020   Procedure: CARDIOVERSION;  Surgeon: Parke Poisson, MD;  Location: [facility] ENDOSCOPY;  Service: Cardiovascular;  Laterality: N/A;   COLONOSCOPY     IR 3D INDEPENDENT  [facility]  08/11/2020   IR 3D INDEPENDENT [facility]  08/11/2020   IR ANGIOGRAM SELECTIVE EACH ADDITIONAL VESSEL  08/11/2020   IR ANGIOGRAM SELECTIVE EACH ADDITIONAL VESSEL  08/11/2020   IR ANGIOGRAM SELECTIVE EACH ADDITIONAL VESSEL  08/11/2020   IR ANGIOGRAM SELECTIVE EACH ADDITIONAL VESSEL  08/11/2020   IR ANGIOGRAM SELECTIVE EACH ADDITIONAL VESSEL  08/11/2020   IR ANGIOGRAM SELECTIVE EACH ADDITIONAL VESSEL  08/11/2020   IR ANGIOGRAM SELECTIVE EACH ADDITIONAL VESSEL  08/26/2020   IR ANGIOGRAM SELECTIVE EACH ADDITIONAL VESSEL  08/26/2020   IR ANGIOGRAM VISCERAL SELECTIVE  08/11/2020   IR ANGIOGRAM VISCERAL SELECTIVE  08/26/2020   IR EMBO ARTERIAL NOT HEMORR HEMANG INC GUIDE ROADMAPPING  08/11/2020   IR EMBO TUMOR ORGAN ISCHEMIA INFARCT INC GUIDE ROADMAPPING  08/26/2020   IR IMAGING GUIDED PORT INSERTION  02/23/2020   IR PARACENTESIS  01/05/2023   IR PARACENTESIS  01/18/2023   IR PARACENTESIS  01/25/2023   IR PARACENTESIS  02/01/2023   IR PARACENTESIS  02/08/2023   IR PARACENTESIS  02/15/2023   IR PARACENTESIS  02/23/2023   IR PARACENTESIS  03/01/2023   IR PARACENTESIS  03/08/2023   IR PARACENTESIS  03/15/2023   IR PARACENTESIS  03/22/2023   IR PARACENTESIS  03/29/2023   IR PARACENTESIS  04/05/2023   IR PARACENTESIS  04/12/2023   IR PARACENTESIS  04/19/2023   IR PARACENTESIS  04/27/2023   IR PARACENTESIS  05/03/2023   IR PARACENTESIS  05/10/2023   IR PARACENTESIS  05/17/2023   IR PARACENTESIS  05/25/2023   IR PARACENTESIS  05/31/2023   IR PARACENTESIS  06/07/2023   IR PARACENTESIS  06/14/2023   IR PARACENTESIS  06/21/2023   IR PARACENTESIS  06/28/2023   IR PARACENTESIS  07/05/2023   IR PARACENTESIS  07/12/2023   IR PARACENTESIS  07/19/2023   IR PARACENTESIS  07/26/2023   IR PARACENTESIS  08/02/2023   IR PARACENTESIS  08/09/2023   IR PARACENTESIS  08/16/2023   IR PARACENTESIS  08/23/2023   IR PARACENTESIS  08/30/2023   IR RADIOLOGIST EVAL & MGMT  07/14/2020   IR RADIOLOGIST EVAL & MGMT  09/30/2020   IR RADIOLOGIST EVAL & MGMT   12/01/2020   IR US GUIDE VASC ACCESS LEFT  08/26/2020   IR US GUIDE VASC ACCESS RIGHT  08/11/2020     A IV Location/Drains/Wounds Patient Lines/Drains/Airways Status     Active Line/Drains/Airways     Name Placement date Placement time Site Days   Implanted Port 02/23/20 Right Chest 02/23/20  1157  Chest  1286   Peripheral IV 09/01/23 20 G Right Antecubital 09/01/23  1936  Antecubital  less than 1            Intake/Output Last 24 hours  Intake/Output Summary (Last 24 hours) at 09/01/2023 2202 Last data filed at 09/01/2023 2011 Gross per 24 hour  Intake 500 ml  Output --  Net 500 ml    Labs/Imaging Results for orders placed or performed during the hospital encounter of 09/01/23 (from the past 48 hour(s))  Lipase, blood     Status: Abnormal   Collection Time: 09/01/23  4:35 PM  Result Value Ref Range   Lipase 544 (H) 11 - 51 U/L    Comment: RESULTS CONFIRMED BY MANUAL DILUTION Performed at [facility], [address] [place], [place] [address]   Comprehensive metabolic panel     Status: Abnormal   Collection Time: 09/01/23  4:35 PM  Result Value Ref Range   Sodium 131 (L) 135 - 145 mmol/L   Potassium 4.8 3.5 - 5.1 mmol/L   Chloride 98 98 - 111 mmol/L   CO2 19 (L) 22 - 32 mmol/L   Glucose, Bld 135 (H) 70 - 99 mg/dL    Comment: Glucose reference range applies only to samples taken after fasting for at least 8 hours.   BUN 42 (H) 8 - 23 mg/dL   Creatinine, Ser 3.47 (H) 0.61 - 1.24 mg/dL   Calcium 42.5 (H) 8.9 - 10.3 mg/dL   Total Protein 7.2 6.5 - 8.1 g/dL   Albumin 4.2 3.5 - 5.0 g/dL   AST 48 (H) 15 - 41 U/L   ALT 35 0 - 44 U/L   Alkaline Phosphatase 50 38 - 126 U/L   Total Bilirubin 1.7 (H) 0.3 - 1.2 mg/dL   GFR, Estimated 26 (L) >60 mL/min    Comment: (NOTE) Calculated using the CKD-EPI Creatinine Equation (2021)    Anion gap 14 5 - 15    Comment: Performed at [facility], [address] [place], [place] [address]  CBC     Status: Abnormal    Collection Time: 09/01/23  4:35 PM  Result Value Ref Range   WBC 18.0 (H) 4.0 - 10.5 K/uL   RBC 4.72 4.22 - 5.81 MIL/uL   Hemoglobin 14.9 13.0 - 17.0 g/dL   HCT 75.6 43.3 - 29.5 %   MCV 91.7 80.0 - 100.0 fL   MCH 31.6 26.0 - 34.0 pg   MCHC 34.4 30.0 - 36.0 g/dL  RDW 15.5 11.5 - 15.5 %   Platelets 208 150 - 400 K/uL   nRBC 0.0 0.0 - 0.2 %    Comment: Performed at [facility], [address] [place], [place] [address]  Urinalysis, Routine w reflex microscopic -Urine, Clean Catch     Status: Abnormal   Collection Time: 09/01/23  4:35 PM  Result Value Ref Range   Color, Urine AMBER (A) YELLOW    Comment: BIOCHEMICALS MAY BE AFFECTED BY COLOR   APPearance HAZY (A) CLEAR   Specific Gravity, Urine 1.018 1.005 - 1.030   pH 5.0 5.0 - 8.0   Glucose, UA NEGATIVE NEGATIVE mg/dL   Hgb urine dipstick NEGATIVE NEGATIVE   Bilirubin Urine NEGATIVE NEGATIVE   Ketones, ur 5 (A) NEGATIVE mg/dL   Protein, ur NEGATIVE NEGATIVE mg/dL   Nitrite NEGATIVE NEGATIVE   Leukocytes,Ua NEGATIVE NEGATIVE    Comment: Performed at [facility], [address] [place], [place] [address]  CBG monitoring, ED     Status: Abnormal   Collection Time: 09/01/23  4:57 PM  Result Value Ref Range   Glucose-Capillary 124 (H) 70 - 99 mg/dL    Comment: Glucose reference range applies only to samples taken after fasting for at least 8 hours.   CT ABDOMEN PELVIS WO CONTRAST  Result Date: 09/01/2023 CLINICAL DATA:  Epigastric pain, recent paracentesis. History of liver cancer. Severe chest and abdominal pain. EXAM: CT ABDOMEN AND PELVIS WITHOUT CONTRAST TECHNIQUE: Multidetector CT imaging of the abdomen and pelvis was performed following the standard protocol without IV contrast. RADIATION DOSE REDUCTION: This exam was performed according to the departmental dose-optimization program which includes automated exposure control, adjustment of the mA and/or kV according to patient size and/or use of iterative  reconstruction technique. COMPARISON:  04/08/2020, 11/21/2022. FINDINGS: Lower chest: The heart is normal in size and coronary artery calcifications are noted. The distal tip of a central venous catheter terminates in the right atrium. Scattered pulmonary nodules are noted at the lung bases, the largest measuring 2.2 cm in the right lower lobe, axial image 15. Hepatobiliary: Multiple hypodense nodules and masses are present in the liver measuring up to 5.2 cm in the anterior right lobe of the liver. The right lobe of the liver is atrophied with a nodular contour. Stones are present within the gallbladder. No biliary ductal dilatation. Pancreas: Unremarkable. No pancreatic ductal dilatation or surrounding inflammatory changes. Spleen: Normal in size without focal abnormality. Adrenals/Urinary Tract: The adrenal glands are within normal limits. No renal calculus or hydronephrosis. The bladder is unremarkable. Stomach/Bowel: Stomach is within normal limits. Appendix is not seen. No evidence of bowel wall thickening, distention, or inflammatory changes. No free air or pneumatosis. Vascular/Lymphatic: Aortic atherosclerosis. No enlarged abdominal or pelvic lymph nodes. Reproductive: Prostate gland is mildly enlarged. Other: Large ascites is noted. Musculoskeletal: Degenerative changes are present in the thoracolumbar spine. No acute osseous abnormality. IMPRESSION: 1. No acute intra-abdominal process. 2. Morphologic changes of cirrhosis with multiple hypodense masses in the liver measuring up to 5.2 cm, bowel or evaluated on prior MRI. 3. Scattered pulmonary nodules at the lung bases measuring up to 2.2 cm, increased from 1.4 cm on the previous exam and concerning for metastatic disease. 4. Large ascites. 5. Aortic atherosclerosis. Electronically Signed   By: Thornell Sartorius M.D.   On: 09/01/2023 20:19   DG Chest 2 View  Result Date: 09/01/2023 CLINICAL DATA:  chest pain liver cancer EXAM: CHEST - 2 VIEW COMPARISON:  CT Chest 11/21/22 FINDINGS: Right-sided chest port in place. No pleural effusion. No pneumothorax. Unchanged cardiac and mediastinal contours. No focal airspace opacity. No radiographically apparent acute displaced rib fractures. Visualized upper abdomen is unremarkable. Vertebral body heights are maintained. IMPRESSION: No focal airspace opacity.  No pleural effusion.  No pneumothorax. Electronically Signed   By: Lorenza Cambridge M.D.   On: 09/01/2023 17:55    Pending Labs Unresulted Labs (From admission, onward)    None       Vitals/Pain Today's Vitals   09/01/23 2000 09/01/23 2030 09/01/23 2100 09/01/23 2130  BP: (!) 140/78 (!) 153/83 136/75 (!) 148/80  Pulse: 77 71 70 75  Resp: (!) 22 15 16 15   Temp:      TempSrc:      SpO2: 100% 100% 100% 100%  Weight:      Height:      PainSc:        Isolation Precautions No active isolations  Medications Medications  sodium chloride 0.9 % bolus 500 mL (has no administration in time range)  0.9 %  sodium chloride infusion (has no administration in time range)  oxyCODONE-acetaminophen (PERCOCET/ROXICET) 5-325 MG per tablet 1 tablet (1 tablet Oral Given 09/01/23 1630)  oxyCODONE (Oxy IR/ROXICODONE) immediate release tablet 5 mg (5 mg Oral Given 09/01/23 1939)  lactated ringers bolus 500 mL (0 mLs Intravenous Stopped 09/01/23 2011)    Mobility walks with device     Focused Assessments See provider note.   R Recommendations: See Admitting Provider Note  Report given to:   Additional Notes: Pt A&Ox4, epigastric pain.

## 2023-09-02 DIAGNOSIS — C221 Intrahepatic bile duct carcinoma: Secondary | ICD-10-CM

## 2023-09-02 DIAGNOSIS — Z66 Do not resuscitate: Secondary | ICD-10-CM

## 2023-09-02 DIAGNOSIS — K851 Biliary acute pancreatitis without necrosis or infection: Secondary | ICD-10-CM | POA: Diagnosis not present

## 2023-09-02 DIAGNOSIS — Z7189 Other specified counseling: Secondary | ICD-10-CM | POA: Diagnosis not present

## 2023-09-02 DIAGNOSIS — Z515 Encounter for palliative care: Secondary | ICD-10-CM

## 2023-09-02 DIAGNOSIS — C7800 Secondary malignant neoplasm of unspecified lung: Secondary | ICD-10-CM

## 2023-09-02 LAB — CBC
HCT: 37.6 % — ABNORMAL LOW (ref 39.0–52.0)
Hemoglobin: 13.2 g/dL (ref 13.0–17.0)
MCH: 31.5 pg (ref 26.0–34.0)
MCHC: 35.1 g/dL (ref 30.0–36.0)
MCV: 89.7 fL (ref 80.0–100.0)
Platelets: 116 10*3/uL — ABNORMAL LOW (ref 150–400)
RBC: 4.19 MIL/uL — ABNORMAL LOW (ref 4.22–5.81)
RDW: 15.7 % — ABNORMAL HIGH (ref 11.5–15.5)
WBC: 17.9 10*3/uL — ABNORMAL HIGH (ref 4.0–10.5)
nRBC: 0 % (ref 0.0–0.2)

## 2023-09-02 LAB — BASIC METABOLIC PANEL
Anion gap: 14 (ref 5–15)
BUN: 42 mg/dL — ABNORMAL HIGH (ref 8–23)
CO2: 15 mmol/L — ABNORMAL LOW (ref 22–32)
Calcium: 9.8 mg/dL (ref 8.9–10.3)
Chloride: 101 mmol/L (ref 98–111)
Creatinine, Ser: 2.18 mg/dL — ABNORMAL HIGH (ref 0.61–1.24)
GFR, Estimated: 31 mL/min — ABNORMAL LOW (ref >60)
Glucose, Bld: 89 mg/dL (ref 70–99)
Potassium: 4.4 mmol/L (ref 3.5–5.1)
Sodium: 130 mmol/L — ABNORMAL LOW (ref 135–145)

## 2023-09-02 LAB — HEPATIC FUNCTION PANEL
ALT: 30 U/L (ref 0–44)
AST: 41 U/L (ref 15–41)
Albumin: 3.4 g/dL — ABNORMAL LOW (ref 3.5–5.0)
Alkaline Phosphatase: 43 U/L (ref 38–126)
Bilirubin, Direct: 0.6 mg/dL — ABNORMAL HIGH (ref 0.0–0.2)
Indirect Bilirubin: 1.1 mg/dL — ABNORMAL HIGH (ref 0.3–0.9)
Total Bilirubin: 1.7 mg/dL — ABNORMAL HIGH (ref 0.3–1.2)
Total Protein: 5.9 g/dL — ABNORMAL LOW (ref 6.5–8.1)

## 2023-09-02 LAB — PHOSPHORUS: Phosphorus: 4 mg/dL (ref 2.5–4.6)

## 2023-09-02 LAB — LIPID PANEL
Cholesterol: 102 mg/dL (ref 0–200)
HDL: 20 mg/dL — ABNORMAL LOW (ref >40)
LDL Cholesterol: 57 mg/dL (ref 0–99)
Total CHOL/HDL Ratio: 5.1 {ratio}
Triglycerides: 125 mg/dL (ref <150)
VLDL: 25 mg/dL (ref 0–40)

## 2023-09-02 LAB — GLUCOSE, CAPILLARY
Glucose-Capillary: 100 mg/dL — ABNORMAL HIGH (ref 70–99)
Glucose-Capillary: 77 mg/dL (ref 70–99)
Glucose-Capillary: 85 mg/dL (ref 70–99)
Glucose-Capillary: 96 mg/dL (ref 70–99)

## 2023-09-02 LAB — MAGNESIUM: Magnesium: 2.1 mg/dL (ref 1.7–2.4)

## 2023-09-02 MED ORDER — HYOSCYAMINE SULFATE 0.125 MG SL SUBL: 0.1250 mg | SUBLINGUAL_TABLET | SUBLINGUAL | Status: DC | PRN

## 2023-09-02 MED ORDER — SODIUM CHLORIDE 0.9 % IV SOLN: INTRAVENOUS | Status: AC

## 2023-09-02 MED ORDER — FINASTERIDE 5 MG PO TABS
5.0000 mg | ORAL_TABLET | Freq: Every day | ORAL | Status: DC
  Administered 2023-09-02 – 2023-09-05 (×4): 5 mg via ORAL
  Filled 2023-09-02 (×4): qty 1

## 2023-09-02 MED ORDER — LIDOCAINE 4 % EX CREA
TOPICAL_CREAM | Freq: Once | CUTANEOUS | Status: AC
  Filled 2023-09-02: qty 5

## 2023-09-02 NOTE — Progress Notes (Signed)
Patient was not urinating since got here. Bladder scan done at 1300 PM and It was 566 ml. In and Out done by NT and second try by primary RN.  It didn't work . No urine came out just < 50 ml.  Made the provider aware and consulted for coude nurse . She did try with foley. Nothing worked. Waiting for Dr Joyce Gross to come and do. Will continue to monitor

## 2023-09-02 NOTE — Progress Notes (Signed)
Patient ID: Brandon Sherman, male   DOB: March 01, 1946, 77 y.o.   MRN: [id number]  [facility]- Brief note  GI had been asked to see by admitting physician overnight, regarding concern for pancreatitis.  Patient had presented with acute abdominal pain worse after he had undergone paracentesis on 08/30/2023.  After review of chart, patient is being cared for by Dr. 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  He has been treated with multiple regimens of chemotherapy, and as of February 2024 last treatment was stopped due to progression of disease. When he was last seen by Dr. Mosetta Putt 08/13/2023, she had recommended palliative care and observation and weekly paracentesis for comfort.  CT of the abdomen pelvis last evening shows multiple hepatic masses, scattered pulmonary nodules, gallstones, no biliary ductal dilation, unremarkable appearing pancreas, large amount of ascites.  Discussed with Dr. Mahala Menghini, no clear role for any specific GI intervention.  Recommend involving Dr.Feng  his oncologist. GI will be available if needed.

## 2023-09-02 NOTE — Procedures (Signed)
Foley Catheter Placement Note  Indications: 77 y.o. male with a history of metastatic cholangiocarcinoma and high volume ascites. Urology consulted for bladder scans >500cc and foley catheter placement yielding 50-100cc.  Pre-operative Diagnosis: Urinary retention  Post-operative Diagnosis: Same  Surgeon: Cathren Harsh, MD  Assistants: None  Procedure Details  Patient was placed in the supine position, prepped with Betadine and draped in the usual sterile fashion.  We injected lidocaine jelly per urethra prior to the procedure.  We then inserted a 16 [place] coude catheter per urethra which easily passed into the bladder without any resistance at the prostatic urethra.  We achieved return of clear yellow urine and then proceeded to insert 10 mL of sterile water into the Foley balloon.  The catheter was attached to a drainage bag and secured with a StatLock.  Placement of the catheter had return of about 100cc of urine. I then irrigated the catheter which easily flushed and withdrew, though the patient had notably sensitive bladder spasms.                Complications: None; patient tolerated the procedure well.  Plan:   1.  Bladder scans likely to be highly inaccurate in this patient given high volume ascites 2. Patient appears to have been voiding adequate volumes (100-200cc voids) per chart review, and foley catheter was recommended by primary team given bladder scans. Can remove at primary team's discretion, but would not utilize bladder scans for TOV.  3. Recommend flomax if not contraindicated 4. Recommend hyoscyamine for bladder spasms if not contraindicated   Attending Attestation: Dr. Berneice Heinrich was available.

## 2023-09-02 NOTE — H&P (Addendum)
History and Physical    Brandon Sherman DGL:[id number] DOB: December 15, 1945 DOA: 09/01/2023  PCP: Luis Abed, MD   Patient coming from: Home   Chief Complaint:  Chief Complaint  Patient presents with   Hepatic Cancer   Chest Pain    HPI:  Brandon Sherman is a 77 y.o. male with hx of Metastatic cholangiocarcinoma, pulmonary nodules suspect mets, progressed despite multiple lines of chemo, currently recommended for palliative / hospice by oncology, recurrent ascites requiring weekly paracentesis, A-fib on anticoagulation, HFpEF, CKD 3, hypertension, diabetes, who presents due to 2 days worsening epigastric abdominal pain.  States pain began worsening after his paracentesis on 10/10 had 8.1 L clear yellow fluid removed.  He has associated nausea and decreased oral intake, decreased appetite.  No vomiting.  Denies any fevers.  Pain is localized in the epigastrium, no diffuse abdominal pain.  Denies preceding history of colicky right upper quadrant pain with food.  No alcohol    Review of Systems:  ROS complete and negative except as marked above   No Known Allergies  Prior to Admission medications   Medication Sig Start Date End Date Taking? Authorizing Provider  allopurinol (ZYLOPRIM) 100 MG tablet Take 100 mg by mouth daily.   Yes [provider]  apixaban (ELIQUIS) 5 MG TABS tablet TAKE 1 TABLET TWICE DAILY 07/27/23  Yes Runell Gess, MD  atorvastatin (LIPITOR) 40 MG tablet Take 1 tablet (40 mg total) by mouth daily. Please call [phone] to schedule a January appointment for future refills. Thank you. 07/24/23  Yes Runell Gess, MD  colchicine 0.6 MG tablet Take 0.6 mg by mouth daily as needed (for gout flare up).   Yes [provider]  fenofibrate (TRICOR) 145 MG tablet Take 145 mg by mouth daily.   Yes [provider]  finasteride (PROSCAR) 5 MG tablet Take 5 mg by mouth daily.   Yes [provider]  furosemide (LASIX) 40 MG tablet  TAKE 2 TABLETS BY MOUTH TWICE A DAY 08/27/23  Yes Runell Gess, MD  gabapentin (NEURONTIN) 300 MG capsule Take 300 mg by mouth 2 (two) times daily.   Yes [provider]  hydrALAZINE (APRESOLINE) 50 MG tablet TAKE 1.5 TABLETS (75 MG TOTAL) BY MOUTH 3 (THREE) TIMES DAILY. 06/25/23  Yes Runell Gess, MD  metoprolol succinate (TOPROL-XL) 50 MG 24 hr tablet TAKE 1 TABLET BY MOUTH EVERY DAY WITH OR IMMEDIATELY FOLLOWING A MEAL 08/27/23  Yes Runell Gess, MD  olmesartan (BENICAR) 40 MG tablet TAKE 1 TABLET (40 MG TOTAL) BY MOUTH DAILY. 07/10/23  Yes Runell Gess, MD  pantoprazole (PROTONIX) 40 MG tablet Take 1 tablet (40 mg total) by mouth 2 (two) times daily. 06/18/23  Yes Malachy Mood, MD  tamsulosin (FLOMAX) 0.4 MG CAPS capsule Take 0.4 mg by mouth daily.   Yes [provider]  clindamycin (CLEOCIN) 300 MG capsule Take 1 capsule (300 mg total) by mouth 3 (three) times daily. 01/29/23   Charlynne Pander, MD  doxycycline (VIBRAMYCIN) 100 MG capsule Take 1 capsule (100 mg total) by mouth 2 (two) times daily. One po bid x 7 days 01/29/23   Charlynne Pander, MD  lidocaine-prilocaine (EMLA) cream Apply 1 Application topically as needed. 03/21/23   Malachy Mood, MD  metFORMIN (GLUCOPHAGE-XR) 500 MG 24 hr tablet Take 250 mg by mouth daily with breakfast. 07/14/21   [provider]  ondansetron (ZOFRAN) 8 MG tablet Take 1 tablet (8 mg total) by  mouth every 8 (eight) hours as needed for nausea or vomiting. 09/11/22   Malachy Mood, MD  potassium chloride SA (KLOR-CON M20) 20 MEQ tablet Take 1 tablet (20 mEq total) by mouth daily. 11/28/22   Malachy Mood, MD  sucralfate (CARAFATE) 1 g tablet Take 1 tablet (1 g total) by mouth every 6 (six) hours as needed. Please schedule a yearly follow up for further refills: [phone] 06/15/20   [name], Willaim Rayas, MD    Past Medical History:  Diagnosis Date   Arthritis    Diabetes [facility])    GERD (gastroesophageal reflux disease)     Hyperlipidemia    Hypertension    Intrahepatic cholangiocarcinoma (HCC)     Past Surgical History:  Procedure Laterality Date   APPENDECTOMY  1980   CARDIOVERSION N/A 04/27/2020   Procedure: CARDIOVERSION;  Surgeon: Lars Masson, MD;  Location: [facility] ENDOSCOPY;  Service: Cardiovascular;  Laterality: N/A;   CARDIOVERSION N/A 05/19/2020   Procedure: CARDIOVERSION;  Surgeon: Parke Poisson, MD;  Location: [facility] ENDOSCOPY;  Service: Cardiovascular;  Laterality: N/A;   COLONOSCOPY     IR 3D INDEPENDENT WKST  08/11/2020   IR 3D INDEPENDENT WKST  08/11/2020   IR ANGIOGRAM SELECTIVE EACH ADDITIONAL VESSEL  08/11/2020   IR ANGIOGRAM SELECTIVE EACH ADDITIONAL VESSEL  08/11/2020   IR ANGIOGRAM SELECTIVE EACH ADDITIONAL VESSEL  08/11/2020   IR ANGIOGRAM SELECTIVE EACH ADDITIONAL VESSEL  08/11/2020   IR ANGIOGRAM SELECTIVE EACH ADDITIONAL VESSEL  08/11/2020   IR ANGIOGRAM SELECTIVE EACH ADDITIONAL VESSEL  08/11/2020   IR ANGIOGRAM SELECTIVE EACH ADDITIONAL VESSEL  08/26/2020   IR ANGIOGRAM SELECTIVE EACH ADDITIONAL VESSEL  08/26/2020   IR ANGIOGRAM VISCERAL SELECTIVE  08/11/2020   IR ANGIOGRAM VISCERAL SELECTIVE  08/26/2020   IR EMBO ARTERIAL NOT HEMORR HEMANG INC GUIDE ROADMAPPING  08/11/2020   IR EMBO TUMOR ORGAN ISCHEMIA INFARCT INC GUIDE ROADMAPPING  08/26/2020   IR IMAGING GUIDED PORT INSERTION  02/23/2020   IR PARACENTESIS  01/05/2023   IR PARACENTESIS  01/18/2023   IR PARACENTESIS  01/25/2023   IR PARACENTESIS  02/01/2023   IR PARACENTESIS  02/08/2023   IR PARACENTESIS  02/15/2023   IR PARACENTESIS  02/23/2023   IR PARACENTESIS  03/01/2023   IR PARACENTESIS  03/08/2023   IR PARACENTESIS  03/15/2023   IR PARACENTESIS  03/22/2023   IR PARACENTESIS  03/29/2023   IR PARACENTESIS  04/05/2023   IR PARACENTESIS  04/12/2023   IR PARACENTESIS  04/19/2023   IR PARACENTESIS  04/27/2023   IR PARACENTESIS  05/03/2023   IR PARACENTESIS  05/10/2023   IR PARACENTESIS  05/17/2023   IR PARACENTESIS  05/25/2023   IR PARACENTESIS   05/31/2023   IR PARACENTESIS  06/07/2023   IR PARACENTESIS  06/14/2023   IR PARACENTESIS  06/21/2023   IR PARACENTESIS  06/28/2023   IR PARACENTESIS  07/05/2023   IR PARACENTESIS  07/12/2023   IR PARACENTESIS  07/19/2023   IR PARACENTESIS  07/26/2023   IR PARACENTESIS  08/02/2023   IR PARACENTESIS  08/09/2023   IR PARACENTESIS  08/16/2023   IR PARACENTESIS  08/23/2023   IR PARACENTESIS  08/30/2023   IR RADIOLOGIST EVAL & MGMT  07/14/2020   IR RADIOLOGIST EVAL & MGMT  09/30/2020   IR RADIOLOGIST EVAL & MGMT  12/01/2020   IR US GUIDE VASC ACCESS LEFT  08/26/2020   IR US GUIDE VASC ACCESS RIGHT  08/11/2020     reports that he has never smoked. His smokeless tobacco  use includes chew. He reports that he does not currently use alcohol. He reports that he does not use drugs.  Family History  Problem Relation Age of Onset   Heart attack Mother    Stroke Father    Colon cancer Neg Hx    Esophageal cancer Neg Hx    Stomach cancer Neg Hx    Rectal cancer Neg Hx    Colon polyps Neg Hx      Physical Exam: Vitals:   09/01/23 2210 09/01/23 2230 09/01/23 2300 09/01/23 2334  BP: (!) 139/90 (!) 146/81 (!) 154/81 (!) 140/87  Pulse: 76 74 77 (!) 59  Resp: 17 16 14 16   Temp:    (!) 97.4 F (36.3 C)  TempSrc:    Oral  SpO2: 100% 100% 100% 100%  Weight:      Height:        Gen: Awake, alert, chronically ill-appearing, cachectic CV: Regular, normal S1, S2, no murmurs  Resp: Normal WOB, CTAB  Abd: Round, no significant distention, positive fluid wave, normoactive, moderate epigastric tenderness.  No rebound, guarding, rigidity MSK: Sarcopenia,, no edema  Skin: No rashes or lesions to exposed skin  Neuro: Alert and interactive  Psych: Affect flat   Data review:   Labs reviewed, notable for:   Lipase 554 T. bili 1.7 AST 48  NA 131 Bicarb 19 Creatinine 2.51, up from 1.8 Ca 10.5  WBC 18  Micro:  Results for orders placed or performed during the hospital encounter of 05/20/20  SARS  Coronavirus 2 by RT PCR (hospital order, performed in [facility] hospital lab) Nasopharyngeal Nasopharyngeal Swab     Status: None   Collection Time: 05/20/20  1:56 AM   Specimen: Nasopharyngeal Swab  Result Value Ref Range Status   SARS Coronavirus 2 NEGATIVE NEGATIVE Final    Comment: (NOTE) SARS-CoV-2 target nucleic acids are NOT DETECTED.  The SARS-CoV-2 RNA is generally detectable in upper and lower respiratory specimens during the acute phase of infection. The lowest concentration of SARS-CoV-2 viral copies this assay can detect is 250 copies / mL. A negative result does not preclude SARS-CoV-2 infection and should not be used as the sole basis for treatment or other patient management decisions.  A negative result may occur with improper specimen collection / handling, submission of specimen other than nasopharyngeal swab, presence of viral mutation(s) within the areas targeted by this assay, and inadequate number of viral copies (<250 copies / mL). A negative result must be combined with clinical observations, patient history, and epidemiological information.  Fact Sheet for Patients:   BoilerBrush.com.cy  Fact Sheet for Healthcare Providers: https://pope.com/  This test is not yet approved or  cleared by the [place] FDA and has been authorized for detection and/or diagnosis of SARS-CoV-2 by FDA under an Emergency Use Authorization (EUA).  This EUA will remain in effect (meaning this test can be used) for the duration of the COVID-19 declaration under Section 564(b)(1) of the Act, 21 U.S.C. section 360bbb-3(b)(1), unless the authorization is terminated or revoked sooner.  Performed at [facility], [address] [place], [place] [address]     Imaging reviewed:  CT ABDOMEN PELVIS WO CONTRAST  Result Date: 09/01/2023 CLINICAL DATA:  Epigastric pain, recent paracentesis. History of liver cancer. Severe chest  and abdominal pain. EXAM: CT ABDOMEN AND PELVIS WITHOUT CONTRAST TECHNIQUE: Multidetector CT imaging of the abdomen and pelvis was performed following the standard protocol without IV contrast. RADIATION DOSE REDUCTION: This exam was  performed according to the departmental dose-optimization program which includes automated exposure control, adjustment of the mA and/or kV according to patient size and/or use of iterative reconstruction technique. COMPARISON:  04/08/2020, 11/21/2022. FINDINGS: Lower chest: The heart is normal in size and coronary artery calcifications are noted. The distal tip of a central venous catheter terminates in the right atrium. Scattered pulmonary nodules are noted at the lung bases, the largest measuring 2.2 cm in the right lower lobe, axial image 15. Hepatobiliary: Multiple hypodense nodules and masses are present in the liver measuring up to 5.2 cm in the anterior right lobe of the liver. The right lobe of the liver is atrophied with a nodular contour. Stones are present within the gallbladder. No biliary ductal dilatation. Pancreas: Unremarkable. No pancreatic ductal dilatation or surrounding inflammatory changes. Spleen: Normal in size without focal abnormality. Adrenals/Urinary Tract: The adrenal glands are within normal limits. No renal calculus or hydronephrosis. The bladder is unremarkable. Stomach/Bowel: Stomach is within normal limits. Appendix is not seen. No evidence of bowel wall thickening, distention, or inflammatory changes. No free air or pneumatosis. Vascular/Lymphatic: Aortic atherosclerosis. No enlarged abdominal or pelvic lymph nodes. Reproductive: Prostate gland is mildly enlarged. Other: Large ascites is noted. Musculoskeletal: Degenerative changes are present in the thoracolumbar spine. No acute osseous abnormality. IMPRESSION: 1. No acute intra-abdominal process. 2. Morphologic changes of cirrhosis with multiple hypodense masses in the liver measuring up to 5.2 cm,  bowel or evaluated on prior MRI. 3. Scattered pulmonary nodules at the lung bases measuring up to 2.2 cm, increased from 1.4 cm on the previous exam and concerning for metastatic disease. 4. Large ascites. 5. Aortic atherosclerosis. Electronically Signed   By: Thornell Sartorius M.D.   On: 09/01/2023 20:19   DG Chest 2 View  Result Date: 09/01/2023 CLINICAL DATA:  chest pain liver cancer EXAM: CHEST - 2 VIEW COMPARISON:  CT Chest 11/21/22 FINDINGS: Right-sided chest port in place. No pleural effusion. No pneumothorax. Unchanged cardiac and mediastinal contours. No focal airspace opacity. No radiographically apparent acute displaced rib fractures. Visualized upper abdomen is unremarkable. Vertebral body heights are maintained. IMPRESSION: No focal airspace opacity.  No pleural effusion.  No pneumothorax. Electronically Signed   By: Lorenza Cambridge M.D.   On: 09/01/2023 17:55     ED Course:  Treated with oxycodone, 500 cc LR   Assessment/Plan:  77 y.o. male with hx Metastatic cholangiocarcinoma, pulmonary nodules suspect mets, progressed despite multiple lines of chemo, currently recommended for palliative / hospice by oncology, recurrent ascites requiring weekly paracentesis, A-fib on anticoagulation, HFpEF, CKD 3, hypertension, diabetes, who presents due to 2 days worsening epigastric abdominal pain. Found to have likely acute pancreatitis   Acute pancreatitis  Lipase 544, minimal elevation in T. bili 1.7, AST 48.  Pain does not radiate to back but is very localized in the epigastrium, with acute onset.  No CT changes of acute pancreatitis; but despite this feel with his pain and lipase still consistent.  Does have gallstones, and known cholangiocarcinoma.  No biliary dilation on CT. suspect gallstone versus cholangiocarcinoma related pancreatitis.  -Status post 500 cc LR, give additional 500 cc NS and started on 100 cc an hour x 10-hour then reevaluate need for IV fluids -Pain control with oxycodone 5  /7.5 mg every 6 hours as needed for moderate/severe, Dilaudid 0.5 mg IV every 4 hours for breakthrough or unable to take p.o. -GI consult in [place], I messaged [place] [facility] Dr. Myrtie Neither overnight. Appreciate input re: utility of MRCP  in this case to delineate possible distal CBD process  -May need general surgical consult, held off pending of additional evaluation.  However does have significant comorbidity -Currently n.p.o. with sips -Held home apixaban in case needs upcoming procedure  Metastatic cholangiocarcinoma Enlarging pulmonary nodules, suspicious for mets  Goals of care CT imaging incidental finding enlarging pulmonary nodules lung bases suspicious for worsening metastasis.  Per review of oncology notes has progressed despite multiple lines of chemotherapy has had no further treatment options available.  Had been recommended for palliative care and hospice but reportedly not ready for this yet.  I discussed the findings of suspected worsening pulmonary metastases.  Given that he has no treatment options available and progressive disease, worsening functional status, he is a candidate for hospice care. I discussed estimated prognosis < 6 months.  Patient and family are understanding of this information.  He seems to value time at home, and concentrates on getting back home during our discussion for his hospitalization.  Family is familiar with hospice and has had good experiences with them in the past.  For now he defers decisions about his CODE STATUS to his wife.  She is not ready to make a change from full code.  But is willing to speak more to palliative care and continue goals of care discussions -For now remains full code -Palliative care consult ordered overnight, to see tomorrow. Appreciate help with symptomatic management and GOC discussions   AKI stage I, background CKD stage III Baseline creatinine 1.8, up to 2.51.  Suspect prerenal -IV fluids per above -Check PVR  Recurrent large volume  ascites Currently without tense ascites; last paracentesis 10/10 with 8.1 L yellow fluid removed.  Do not feel presentation consistent with SBP due to the rapid onset and localized symptoms.  -Hold home diuretics with AKI  Chronic medical problems: A-fib on anticoagulation: Continue home metoprolol.  Holding apixaban in case he needs procedure HFpEF: Hold diuretics per above  Hypertension: Hold his home olmesartan, with AKI HLD: Held fibrate although doubt cause of pancreatitis. Held statin for now, may be of limited benefit with his prognosis  Diabetes: SSI while inpatient GERD: PPI  Gout: Continue allopurinol BPH: Continue tamsulosin  Body mass index is 27.52 kg/m.    DVT prophylaxis:  SCDs Code Status:  Full Code; See GOC above  Diet:  Diet Orders (From admission, onward)     Start     Ordered   09/01/23 2203  Diet NPO time specified Except for: Sips with Meds, Other (See Comments)  Diet effective now       Comments: Sips clear liquids  Question Answer Comment  Except for Sips with Meds   Except for Other (See Comments)      09/01/23 2215           Family Communication:  Yes discussed with wife and daughter at bedside   Consults:  [facility] [place], Palliative  Admission status:   Inpatient, Telemetry bed  Severity of Illness: The appropriate patient status for this patient is INPATIENT. Inpatient status is judged to be reasonable and necessary in order to provide the required intensity of service to ensure the patient's safety. The patient's presenting symptoms, physical exam findings, and initial radiographic and laboratory data in the context of their chronic comorbidities is felt to place them at high risk for further clinical deterioration. Furthermore, it is not anticipated that the patient will be medically stable for discharge from the hospital within 2 midnights of admission.   *  I certify that at the point of admission it is my clinical judgment that the patient will  require inpatient hospital care spanning beyond 2 midnights from the point of admission due to high intensity of service, high risk for further deterioration and high frequency of surveillance required.*   Dolly Rias, MD [facility]  How to contact the [facility] Attending or Consulting provider 7A - 7P or covering provider during after hours 7P -7A, for this patient.  Check the care team in [facility] and look for a) attending/consulting [facility] provider listed and b) the [facility] team listed Log into [email or web address] and use [facility] universal password to access. If you do not have the password, please contact the hospital operator. Locate the [facility] provider you are looking for under [facility] and page to a number that you can be directly reached. If you still have difficulty reaching the provider, please page the [facility] (Director on Call) for the Hospitalists listed on amion for assistance.  09/02/2023, 3:06 AM

## 2023-09-02 NOTE — Progress Notes (Signed)
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 Blake Divine was not ready at that time for hospice--now is LVP up to 10 to 12 L each time dependent-declined Pleurx also   Last LVP 8.1 L on 08/30/2023 Gout Paroxysmal A-fib status post DCCV 04/2020 on apixaban DM TY 2 CKD 3 at baseline BMI 31   Brought to emergency room 09/01/2023 severe abdominal chest pain Found to have acute pancreatitis bili 1.7 AST 48 Lipase 544 CT imaging =?  GB stone versus cholangiocarcinoma related pancreatitis Rx saline bolus 100 cc/H----also found to have AKI creatinine up to 2.5  GI consulted regarding MRCP-contemplated GEN surgery consult but probably not a good candidate for overt surgery Palliative care consulted additionally   Plan  End-stage cholangiocarcinoma enlarging pulmonary nodules pancreatitis related to the same-leukocytosis related pancreatitis Worsening thrombocytopenia Seems to understand that no curative options at this time-await palliative care input tells me he may want to go home Discussed case with GI and they are holding on MRCP as they do not feel there is any role for aggressive workup etc. given end-stage nature of disease On hold from prior to admission Gentle fluids as below because of pancreatitis-do not trend lipase but symptomatically manage  AKI  superimposed on CKD 3 with metabolic acidosis on admission Tense ascites secondary to cholangiocarcinoma Placed on saline at 50 cc/H He is third spacing from his ascites--Lasix held unless going home with comfort Stop Benicar completely Bladder scan  Permanent A-fib CHADVASC >4 on apixaban which was held on admission Might need procedure paracentesis?  Rate control with metoprolol 50 XL HFpEF See above discussion regarding diuretics  Gout BPH Flomax resumed at admission, resuming Proscar 5  BMI 27 DM TY 2\ Holding metformin given elevated creatinine-monitor sugars only with sliding scale  Cancer related cachexia severe malnutrition  DVT prophylaxis: SCD  Status is: Inpatient Remains inpatient appropriate because:   Requires further goals of care discussions      Subjective: Feels fair-no chest pain Nausea to some degree although no vomit I discussed the case with GI  Objective + exam Vitals:   09/01/23 2210 09/01/23 2230 09/01/23 2300 09/01/23 2334  BP: (!) 139/90 (!) 146/81 (!) 154/81 (!) 140/87  Pulse: 76 74 77 (!) 59  Resp: 17 16 14 16   Temp:    (!) 97.4 F (36.3 C)  TempSrc:    Oral  SpO2: 100% 100% 100% 100%  Weight:      Height:       Filed Weights   09/01/23 1622  Weight: 79.7 kg    Examination:  Icteric white male looks ill, cachectic Tense ascites S1-S2 no murmur Lower extremity edema present ROM intact   Data Reviewed: reviewed   CBC    Component Value Date/Time   WBC 17.9 (H) 09/02/2023 0442   RBC 4.19 (L) 09/02/2023 0442   HGB 13.2 09/02/2023 0442   HGB 12.2 (L) 06/18/2023 0813  HCT 37.6 (L) 09/02/2023 0442   PLT 116 (L) 09/02/2023 0442   PLT 133 (L) 06/18/2023 0813   MCV 89.7 09/02/2023 0442   MCH 31.5 09/02/2023 0442   MCHC 35.1 09/02/2023 0442   RDW 15.7 (H) 09/02/2023 0442   LYMPHSABS 0.7 06/18/2023 0813   MONOABS 0.7 06/18/2023 0813   EOSABS 0.1 06/18/2023 0813   BASOSABS 0.1 06/18/2023 0813      Latest Ref Rng &  Units 09/02/2023    4:42 AM 09/01/2023    4:35 PM 06/18/2023    8:13 AM  CMP  Glucose 70 - 99 mg/dL 89  244  010   BUN 8 - 23 mg/dL 42  42  37   Creatinine 0.61 - 1.24 mg/dL 2.72  5.36  6.44   Sodium 135 - 145 mmol/L 130  131  131   Potassium 3.5 - 5.1 mmol/L 4.4  4.8  4.5   Chloride 98 - 111 mmol/L 101  98  103   CO2 22 - 32 mmol/L 15  19  20    Calcium 8.9 - 10.3 mg/dL 9.8  03.4  9.3   Total Protein 6.5 - 8.1 g/dL 5.9  7.2  6.0   Total Bilirubin 0.3 - 1.2 mg/dL 1.7  1.7  0.9   Alkaline Phos 38 - 126 U/L 43  50  44   AST 15 - 41 U/L 41  48  39   ALT 0 - 44 U/L 30  35  28      Scheduled Meds:  allopurinol  100 mg Oral Daily   gabapentin  100 mg Oral QHS   insulin aspart  0-6 Units Subcutaneous TID WC   metoprolol succinate  50 mg Oral Daily   pantoprazole  40 mg Oral BID   tamsulosin  0.4 mg Oral Daily   Continuous Infusions:  sodium chloride 100 mL/hr at 09/01/23 2339    Time  45  Rhetta Mura, MD  [facility]

## 2023-09-02 NOTE — Consult Note (Signed)
Consultation Note Date: 09/02/2023   Patient Name: Brandon Sherman  DOB: 1946/04/28  MRN: [id number]  Age / Sex: 77 y.o., male  PCP: Luis Abed, MD Referring Physician: Rhetta Mura, MD  Reason for Consultation: Establishing goals of care  HPI/Patient Profile: 77 y.o. male  with past medical history of metastatic cholangiocarcinoma with progression, atrial fibrillation on anticoagulation, HFpEF, CKD stage 3, HTN, diabetes admitted on 09/01/2023 with epigastric pain due to acute pancreatitis vs gallstone. CT with evidence of progressing cancer with enlarging lung nodules. No further treatment options per oncology.   Clinical Assessment and Goals of Care: Consult received and chart review completed including labs, scans/diagnostics, attending and consultant notes. I met today with Brandon Sherman along with his wife, daughter, and grandson. We reviewed concern for cancer progression and limited options. Unfortunately no options from GI or oncology. I spoke with them about the best way we can care for Brandon Sherman is keeping him as happy and comfortable as possible. He wishes to go home. We discussed hospice at home and after some discussion they all agree that hospice services will be helpful. They would be interested in a hospice that has a facility but are looking to see if [facility] or [facility] makes more sense for them.   We spent much time discussing code status. I reiterated that there was past discussion with Dr. Mosetta Putt that resuscitation would not be helpful or recommended and they seemed to agree at that time. Brandon Sherman refers to his wife. Wife is very tearful. I spent time discussing resuscitation and my fear that this will only cause him pain and suffering at end of life and could take time away from his family. We discussed his desire to die peacefully at home vs in a hospital  hooked up to machines. I explained that resuscitation is not successful when we have a serious disease like cancer that we know are leading to end of life. I strongly recommended DNR status - they will continue to discuss and we will revisit tomorrow.   I also discussed with them weekly paracentesis and consideration of peritoneal drain to allow for easier and more frequent drainage. There is a risk of infection but we also know that at some point it is going to be very difficult for Brandon Sherman to come back and forth to be drained every week. They will continue to discuss.   All questions/concerns addressed. Emotional support provided.   Primary Decision Maker PATIENT and wife    SUMMARY OF RECOMMENDATIONS   - Agree with home hospice services at time of discharge (have not decided which hospice yet) - I will meet again tomorrow to revisit desire for DNR and consideration of peritoneal drain  Code Status/Advance Care Planning: Full code - ongoing discussion   Symptom Management:  No changes to pain regimen. Not utilizing many PRNs. Hospice to manage and titrate as needed at home.   Prognosis:  Prognosis poor with advancing cancer. < 6 months.   Discharge Planning: Home with  Hospice      Primary Diagnoses: Present on Admission:  Acute pancreatitis   I have reviewed the medical record, interviewed the patient and family, and examined the patient. The following aspects are pertinent.  Past Medical History:  Diagnosis Date   Arthritis    Diabetes (HCC)    GERD (gastroesophageal reflux disease)    Hyperlipidemia    Hypertension    Intrahepatic cholangiocarcinoma (HCC)    Social History   Socioeconomic History   Marital status: Married    Spouse name: Not on file   Number of children: 3   Years of education: Not on file   Highest education level: Not on file  Occupational History   Not on file  Tobacco Use   Smoking status: Never   Smokeless tobacco: Current     Types: Chew   Tobacco comments:    for 60 years, 1-2 daily   Vaping Use   Vaping status: Never Used  Substance and Sexual Activity   Alcohol use: Not Currently    Comment: occasionally   Drug use: Never   Sexual activity: Not on file  Other Topics Concern   Not on file  Social History Narrative   Not on file   Social Determinants of Health   Financial Resource Strain: Not on file  Food Insecurity: Not on file  Transportation Needs: No Transportation Needs (05/21/2020)   PRAPARE - Transportation    Lack of Transportation (Medical): No    Lack of Transportation (Non-Medical): No  Physical Activity: Insufficiently Active (05/21/2020)   Exercise Vital Sign    Days of Exercise per Week: 1 day    Minutes of Exercise per Session: 50 min  Stress: Not on file  Social Connections: Unknown (05/21/2020)   Social Connection and Isolation Panel [NHANES]    Frequency of Communication with Friends and Family: More than three times a week    Frequency of Social Gatherings with Friends and Family: More than three times a week    Attends Religious Services: Not on Marketing executive or Organizations: Not on file    Attends Banker Meetings: Not on file    Marital Status: Married   Family History  Problem Relation Age of Onset   Heart attack Mother    Stroke Father    Colon cancer Neg Hx    Esophageal cancer Neg Hx    Stomach cancer Neg Hx    Rectal cancer Neg Hx    Colon polyps Neg Hx    Scheduled Meds:  allopurinol  100 mg Oral Daily   gabapentin  100 mg Oral QHS   insulin aspart  0-6 Units Subcutaneous TID WC   metoprolol succinate  50 mg Oral Daily   pantoprazole  40 mg Oral BID   tamsulosin  0.4 mg Oral Daily   Continuous Infusions: PRN Meds:.acetaminophen, HYDROmorphone (DILAUDID) injection, ondansetron (ZOFRAN) IV, oxyCODONE **OR** oxyCODONE No Known Allergies Review of Systems  Constitutional:  Positive for activity change, appetite change and  fatigue.  Gastrointestinal:  Positive for abdominal distention and abdominal pain.  Genitourinary:  Positive for difficulty urinating.  Neurological:  Positive for weakness.    Physical Exam Vitals and nursing note reviewed.  Constitutional:      Appearance: He is ill-appearing.  Cardiovascular:     Rate and Rhythm: Bradycardia present.  Pulmonary:     Effort: No tachypnea, accessory muscle usage or respiratory distress.  Abdominal:     General: There  is distension.  Neurological:     Mental Status: He is alert and oriented to person, place, and time.     Vital Signs: BP (!) 140/87 (BP Location: Left Arm)   Pulse (!) 59   Temp (!) 97.4 F (36.3 C) (Oral)   Resp 16   Ht 5\' 7"  (1.702 m)   Wt 79.7 kg   SpO2 100%   BMI 27.52 kg/m  Pain Scale: 0-10   Pain Score: 0-No pain   SpO2: SpO2: 100 % O2 Device:SpO2: 100 % O2 Flow Rate: .   IO: Intake/output summary:  Intake/Output Summary (Last 24 hours) at 09/02/2023 0910 Last data filed at 09/01/2023 2339 Gross per 24 hour  Intake 500 ml  Output 650 ml  Net -150 ml    LBM: Last BM Date :  (PTA) Baseline Weight: Weight: 79.7 kg Most recent weight: Weight: 79.7 kg     Palliative Assessment/Data:   Time Total: 75 min  Greater than 50%  of this time was spent counseling and coordinating care related to the above assessment and plan.  Signed by: Yong Channel, NP Palliative Medicine Team Pager # [phone] (M-F 8a-5p) Team Phone # [phone] (Nights/Weekends)

## 2023-09-02 NOTE — Progress Notes (Signed)
Inserted 26fr. Coude foley with no resistance. Pt reported some burning during insertion. Very small amount of urine return, (<10 cc). NT on unit scanned pt and scanned for >450cc of urine in bladder. Pt reported needed to urinate, this RN told pt that foley was in and he could just urinate. Pt began to urinate around the foley. Removed foley and suggested to patient's RN to put in for a urology consult.

## 2023-09-03 ENCOUNTER — Other Ambulatory Visit: Payer: Self-pay

## 2023-09-03 DIAGNOSIS — Z7189 Other specified counseling: Secondary | ICD-10-CM | POA: Diagnosis not present

## 2023-09-03 DIAGNOSIS — Z66 Do not resuscitate: Secondary | ICD-10-CM

## 2023-09-03 DIAGNOSIS — Z515 Encounter for palliative care: Secondary | ICD-10-CM | POA: Diagnosis not present

## 2023-09-03 DIAGNOSIS — K851 Biliary acute pancreatitis without necrosis or infection: Secondary | ICD-10-CM | POA: Diagnosis not present

## 2023-09-03 DIAGNOSIS — C221 Intrahepatic bile duct carcinoma: Secondary | ICD-10-CM | POA: Diagnosis not present

## 2023-09-03 LAB — BASIC METABOLIC PANEL
Anion gap: 10 (ref 5–15)
BUN: 51 mg/dL — ABNORMAL HIGH (ref 8–23)
CO2: 14 mmol/L — ABNORMAL LOW (ref 22–32)
Calcium: 9.3 mg/dL (ref 8.9–10.3)
Chloride: 107 mmol/L (ref 98–111)
Creatinine, Ser: 2.32 mg/dL — ABNORMAL HIGH (ref 0.61–1.24)
GFR, Estimated: 29 mL/min — ABNORMAL LOW (ref >60)
Glucose, Bld: 87 mg/dL (ref 70–99)
Potassium: 4.7 mmol/L (ref 3.5–5.1)
Sodium: 131 mmol/L — ABNORMAL LOW (ref 135–145)

## 2023-09-03 LAB — CBC WITH DIFFERENTIAL/PLATELET
Abs Immature Granulocytes: 0.11 10*3/uL — ABNORMAL HIGH (ref 0.00–0.07)
Basophils Absolute: 0 10*3/uL (ref 0.0–0.1)
Basophils Relative: 0 %
Eosinophils Absolute: 0 10*3/uL (ref 0.0–0.5)
Eosinophils Relative: 0 %
HCT: 39.2 % (ref 39.0–52.0)
Hemoglobin: 13.3 g/dL (ref 13.0–17.0)
Immature Granulocytes: 1 %
Lymphocytes Relative: 3 %
Lymphs Abs: 0.7 10*3/uL (ref 0.7–4.0)
MCH: 31.7 pg (ref 26.0–34.0)
MCHC: 33.9 g/dL (ref 30.0–36.0)
MCV: 93.3 fL (ref 80.0–100.0)
Monocytes Absolute: 0.9 10*3/uL (ref 0.1–1.0)
Monocytes Relative: 4 %
Neutro Abs: 20.7 10*3/uL — ABNORMAL HIGH (ref 1.7–7.7)
Neutrophils Relative %: 92 %
Platelets: 120 10*3/uL — ABNORMAL LOW (ref 150–400)
RBC: 4.2 MIL/uL — ABNORMAL LOW (ref 4.22–5.81)
RDW: 16 % — ABNORMAL HIGH (ref 11.5–15.5)
WBC: 22.5 10*3/uL — ABNORMAL HIGH (ref 4.0–10.5)
nRBC: 0 % (ref 0.0–0.2)

## 2023-09-03 LAB — GLUCOSE, CAPILLARY
Glucose-Capillary: 94 mg/dL (ref 70–99)
Glucose-Capillary: 138 mg/dL — ABNORMAL HIGH (ref 70–99)
Glucose-Capillary: 118 mg/dL — ABNORMAL HIGH (ref 70–99)
Glucose-Capillary: 137 mg/dL — ABNORMAL HIGH (ref 70–99)

## 2023-09-03 MED ORDER — HYDROMORPHONE HCL 1 MG/ML IJ SOLN: 0.5000 mg | Freq: Three times a day (TID) | INTRAMUSCULAR | Status: DC | PRN

## 2023-09-03 MED ORDER — CEFAZOLIN SODIUM-DEXTROSE 2-4 GM/100ML-% IV SOLN
2.0000 g | Freq: Once | INTRAVENOUS | Status: AC
  Administered 2023-09-05: 2 g via INTRAVENOUS

## 2023-09-03 NOTE — Progress Notes (Signed)
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 Blake Divine was not ready at that time for hospice--now is LVP up to 10 to 12 L each time dependent-declined Pleurx also   Last LVP 8.1 L on 08/30/2023 Gout Paroxysmal A-fib status post DCCV 04/2020 on apixaban DM TY 2 CKD 3 at baseline BMI 31   Brought to emergency room 09/01/2023 severe abdominal chest pain Found to have acute pancreatitis bili 1.7 AST 48 Lipase 544 CT imaging =?  GB stone versus cholangiocarcinoma related pancreatitis Rx saline bolus 100 cc/H----also found to have AKI creatinine up to 2.5 [facility] consulted regarding MRCP-contemplated GEN surgery consult but probably not a good candidate for overt surgery Palliative care consulted additionally   Plan  End-stage cholangiocarcinoma enlarging pulmonary nodules pancreatitis related to the same-leukocytosis related pancreatitis Worsening thrombocytopenia Seems to understand that no curative options -Pleurx in am per IR-appreciate input from PMT, IR Home with hospice tomorrow after procedure  AKI superimposed on CKD 3 with metabolic acidosis on admission Tense ascites secondary to cholangiocarcinoma Stop IVF, lasix to comfort demadex 20 bid  Permanent A-fib CHADVASC >4 on apixaban which was held on admission See above discussion Stop rate control at  d/c  Gout BPH Flomax resumed at admission, resuming Proscar 5  BMI 27 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  DVT prophylaxis: SCD  Status is: Inpatient Remains inpatient appropriate because:   Home hospcie in am    Subjective:  Awake coherent in nad  Ambulatory Has decided to go home + Hospice and DNR  Objective + exam Vitals:   09/02/23 2100 09/03/23 0500 09/03/23 0937 09/03/23 1637  BP: 115/75 105/70 111/70 104/67  Pulse: 85 83 85 67  Resp: 18 17    Temp: 98.2 F (36.8 C) 97.8 F (36.6 C) 97.8 F (36.6 C) 98.6 F (37 C)  TempSrc: Oral Oral Oral   SpO2: 97% 98% 99% 98%  Weight:  79.8 kg    Height:       Filed Weights   09/01/23 1622 09/03/23 0500  Weight: 79.7 kg 79.8 kg    Examination:  Icteric white male l Tense ascites S1-S2 no murmur    Data Reviewed: reviewed   CBC    Component Value Date/Time   WBC 22.5 (H) 09/03/2023 0815   RBC 4.20 (L) 09/03/2023 0815   HGB 13.3 09/03/2023 0815   HGB 12.2 (L) 06/18/2023 0813   HCT 39.2 09/03/2023 0815   PLT 120 (L) 09/03/2023 0815   PLT 133 (L) 06/18/2023 0813   MCV 93.3 09/03/2023 0815   MCH 31.7 09/03/2023 0815   MCHC 33.9 09/03/2023 0815   RDW 16.0 (H) 09/03/2023 0815   LYMPHSABS 0.7 09/03/2023 0815   MONOABS 0.9 09/03/2023 0815   EOSABS 0.0 09/03/2023 0815   BASOSABS 0.0 09/03/2023 0815      Latest Ref Rng & Units 09/03/2023    8:15 AM 09/02/2023  4:42 AM 09/01/2023    4:35 PM  CMP  Glucose 70 - 99 mg/dL 87  89  161   BUN 8 - 23 mg/dL 51  42  42   Creatinine 0.61 - 1.24 mg/dL 0.96  0.45  4.09   Sodium 135 - 145 mmol/L 131  130  131   Potassium 3.5 - 5.1 mmol/L 4.7  4.4  4.8   Chloride 98 - 111 mmol/L 107  101  98   CO2 22 - 32 mmol/L 14  15  19    Calcium 8.9 - 10.3 mg/dL 9.3  9.8  81.1   Total Protein 6.5 - 8.1 g/dL  5.9  7.2   Total Bilirubin 0.3 - 1.2 mg/dL  1.7  1.7   Alkaline Phos 38 - 126 U/L  43  50   AST 15 - 41 U/L  41  48   ALT 0 - 44 U/L  30  35       Scheduled Meds:  allopurinol  100 mg Oral Daily   finasteride  5 mg Oral Daily   gabapentin  100 mg Oral QHS   insulin aspart  0-6 Units Subcutaneous TID WC   metoprolol succinate  50 mg Oral Daily   pantoprazole  40 mg Oral BID   tamsulosin  0.4 mg Oral Daily   Continuous Infusions:  [START ON 09/04/2023]  ceFAZolin (ANCEF) IV      Time  15  Rhetta Mura, MD  [facility]

## 2023-09-03 NOTE — Progress Notes (Signed)
IR procedure request - abdominal PleurX catheter   77 y.o. male inpatient. History of a fib ( eliqus listed as home medication. Not given in the inpatient setting), CHF, CKD, DM, metastatic cholangiocarcinoma with recurrent ascites. Presented to the ED at [facility] on 10.12.24 with worsening abdominal pain. Found to have progressing cancer. Patient to be discharged home on Hospice. Team is requesting abdominal PleurX placement.   Patient is currently undergoing paracentesis approximately every 7 days. WBC is 22.5, Team states they are not concerned for infection and believe the WBC is cancer driven.  Sodium 131. All other labs and medications are within acceptable parameters.   IR consulted for possible abdominal PleurX. Case has been reviewed and procedure approved by Dr. Fredia Sorrow.  Patient tentatively scheduled for 10.15.24.  Team instructed to: Keep Patient to be NPO after midnight  IR will call patient when ready.

## 2023-09-03 NOTE — Progress Notes (Signed)
Palliative:  HPI: 77 y.o. male  with past medical history of metastatic cholangiocarcinoma with progression, atrial fibrillation on anticoagulation, HFpEF, CKD stage 3, HTN, diabetes admitted on 09/01/2023 with epigastric pain due to acute pancreatitis vs gallstone. CT with evidence of progressing cancer with enlarging lung nodules. No further treatment options per oncology.   I met today with Brandon Sherman and wife, Brandon Sherman, at bedside. Brandon Sherman is more engaged today although he also seems more frustrated with being in the hospital and ready to go home. We reviewed goals of care. They confirm home with hospice and Brandon Sherman tells me he wants [facility]. We discussed PleurX peritoneal drain and the risks vs benefits. I believe the benefits greatly outweigh the risks as this will decrease need for frequent visits and also allow for more frequent drainage to increase comfort and potentially assist with appetite/intake. Ultimately they both agree with drain placement prior to discharge and understand that hospice will assist them to manage. We discussed resuscitation and Brandon Sherman today is clear that he does not desire resuscitation and he wishes to return home and stay in his home until his last breath. Wife, Brandon Sherman, is supportive of his decisions. We reviewed symptoms and expresses relief with pain medication - avoid IV and utilize po.   All questions/concerns addressed. Emotional support provided. Updated Dr. Mahala Menghini and [facility].   Exam: Alert, oriented. Cachectic. Generalized weakness and fatigue. No distress. Breathing regular, unlabored. Abd distended. Warm to touch. Moves all extremities.   Plan: - DNR - Home with hospice support via [facility] - IR consulted for PleurX peritoneal drain placement - Avoid IV dilaudid and utilize OxyIR 5 mg as this is equivalent to IV dilaudid dosage but will take longer time to achieve relief  40 min  Yong Channel, NP Palliative  Medicine Team Pager [phone] (Please see [email or web address] for schedule) Team Phone [phone]    Greater than 50%  of this time was spent counseling and coordinating care related to the above assessment and plan

## 2023-09-03 NOTE — Progress Notes (Signed)
Transition of Care [facility]) - Inpatient Brief Assessment   Patient Details  Name: Brandon Sherman MRN: [id number] Date of Birth: [birth year]  [facility]) CM/SW Contact:    Janae Bridgeman, RN Phone Number: 09/03/2023, 4:02 PM   Clinical Narrative: CM met with the patient at the bedside to discuss TOC needs to return home with home hospice services.  I spoke with Palliative Care NP and the patient was offered Medicare choice regarding home hospice services and the patient/family chose [facility] through [facility].    Norm Parcel, RNCM with [facility] was notified and she will call and speak with the patient's family to start services.  Patient lives with spouse at the home and has an old RW outside but no DME at the home at this time.  Patient is waiting on IR to place pleur-X catheter for peritoneal drainage.  Patient states that he needs a RW and 3:1 at the home and plans to sleep in his own bed at this time.  Patient plans to return home by car when medically stable for discharge.   Transition of Care Asessment: Insurance and Status: (P) Insurance coverage has been reviewed Patient has primary care physician: (P) Yes Home environment has been reviewed: (P) From home Prior level of function:: (P) Independent - lives with spouse at the home Prior/Current Home Services: (P) No current home services Social Determinants of Health Reivew: (P) SDOH reviewed interventions complete Readmission risk has been reviewed: (P) Yes Transition of care needs: (P) transition of care needs identified, TOC will continue to follow

## 2023-09-04 ENCOUNTER — Other Ambulatory Visit: Payer: Self-pay

## 2023-09-04 ENCOUNTER — Encounter (HOSPITAL_COMMUNITY): Payer: Self-pay | Admitting: Internal Medicine

## 2023-09-04 DIAGNOSIS — C7800 Secondary malignant neoplasm of unspecified lung: Secondary | ICD-10-CM | POA: Diagnosis not present

## 2023-09-04 DIAGNOSIS — C221 Intrahepatic bile duct carcinoma: Secondary | ICD-10-CM | POA: Diagnosis not present

## 2023-09-04 DIAGNOSIS — Z7189 Other specified counseling: Secondary | ICD-10-CM | POA: Diagnosis not present

## 2023-09-04 DIAGNOSIS — Z515 Encounter for palliative care: Secondary | ICD-10-CM | POA: Diagnosis not present

## 2023-09-04 DIAGNOSIS — K851 Biliary acute pancreatitis without necrosis or infection: Secondary | ICD-10-CM | POA: Diagnosis not present

## 2023-09-04 LAB — BASIC METABOLIC PANEL
Anion gap: 13 (ref 5–15)
BUN: 60 mg/dL — ABNORMAL HIGH (ref 8–23)
CO2: 15 mmol/L — ABNORMAL LOW (ref 22–32)
Calcium: 9.2 mg/dL (ref 8.9–10.3)
Chloride: 99 mmol/L (ref 98–111)
Creatinine, Ser: 2.26 mg/dL — ABNORMAL HIGH (ref 0.61–1.24)
GFR, Estimated: 30 mL/min — ABNORMAL LOW (ref >60)
Glucose, Bld: 88 mg/dL (ref 70–99)
Potassium: 4.1 mmol/L (ref 3.5–5.1)
Sodium: 127 mmol/L — ABNORMAL LOW (ref 135–145)

## 2023-09-04 LAB — CBC WITH DIFFERENTIAL/PLATELET
Abs Immature Granulocytes: 0.08 10*3/uL — ABNORMAL HIGH (ref 0.00–0.07)
Basophils Absolute: 0 10*3/uL (ref 0.0–0.1)
Basophils Relative: 0 %
Eosinophils Absolute: 0 10*3/uL (ref 0.0–0.5)
Eosinophils Relative: 0 %
HCT: 36.6 % — ABNORMAL LOW (ref 39.0–52.0)
Hemoglobin: 12.6 g/dL — ABNORMAL LOW (ref 13.0–17.0)
Immature Granulocytes: 1 %
Lymphocytes Relative: 5 %
Lymphs Abs: 0.7 10*3/uL (ref 0.7–4.0)
MCH: 31.7 pg (ref 26.0–34.0)
MCHC: 34.4 g/dL (ref 30.0–36.0)
MCV: 92.2 fL (ref 80.0–100.0)
Monocytes Absolute: 0.7 10*3/uL (ref 0.1–1.0)
Monocytes Relative: 5 %
Neutro Abs: 13.7 10*3/uL — ABNORMAL HIGH (ref 1.7–7.7)
Neutrophils Relative %: 89 %
Platelets: 115 10*3/uL — ABNORMAL LOW (ref 150–400)
RBC: 3.97 MIL/uL — ABNORMAL LOW (ref 4.22–5.81)
RDW: 15.8 % — ABNORMAL HIGH (ref 11.5–15.5)
WBC: 15.3 10*3/uL — ABNORMAL HIGH (ref 4.0–10.5)
nRBC: 0 % (ref 0.0–0.2)

## 2023-09-04 LAB — PROTIME-INR
INR: 1.2 (ref 0.8–1.2)
Prothrombin Time: 15.7 s — ABNORMAL HIGH (ref 11.4–15.2)

## 2023-09-04 LAB — GLUCOSE, CAPILLARY
Glucose-Capillary: 85 mg/dL (ref 70–99)
Glucose-Capillary: 106 mg/dL — ABNORMAL HIGH (ref 70–99)
Glucose-Capillary: 100 mg/dL — ABNORMAL HIGH (ref 70–99)

## 2023-09-04 MED ORDER — DEXTROSE 5 % IV SOLN: INTRAVENOUS | Status: AC

## 2023-09-04 NOTE — Progress Notes (Signed)
   This pt was referred for service of hospice at home. I have met with pt at bedside. He does agree to go home with help of hospice services. I have been unsuccessful with reaching his wife to discuss. Pt does request and feel that a BSC and a walker with wheels would be very helpful at home. I also recommended a w/c. He is hesitant at first but does agree to this. I fear he will need it to get in the home. He reports that his wife will take him home by car hopefully today after plurex drain has been placed.   Norm Parcel RN [phone]

## 2023-09-04 NOTE — Care Management Important Message (Signed)
Important Message  Patient Details  Name: Brandon Sherman MRN: [id number] Date of Birth: Jul 05, 1946   Important Message Given:  Yes - Medicare IM     Dorena Bodo 09/04/2023, 2:09 PM

## 2023-09-04 NOTE — Care Management Important Message (Signed)
Important Message  Patient Details  Name: Brandon Sherman MRN: [id number] Date of Birth: 1946-07-18   Important Message Given:  Yes - Medicare IM     Dorena Bodo 09/04/2023, 2:31 PM

## 2023-09-04 NOTE — Plan of Care (Signed)
Problem: Education: Goal: Ability to describe self-care measures that may prevent or decrease complications (Diabetes Survival Skills Education) will improve Outcome: Progressing   Problem: Education: Goal: Individualized Educational Video(s) Outcome: Progressing   Problem: Coping: Goal: Ability to adjust to condition or change in health will improve Outcome: Progressing

## 2023-09-04 NOTE — Progress Notes (Signed)
Palliative:  HPI: 77 y.o. male  with past medical history of metastatic cholangiocarcinoma with progression, atrial fibrillation on anticoagulation, HFpEF, CKD stage 3, HTN, diabetes admitted on 09/01/2023 with epigastric pain due to acute pancreatitis vs gallstone. CT with evidence of progressing cancer with enlarging lung nodules. No further treatment options per oncology.   I spoke with wife, Brandon Sherman, via telephone. She had some questions. We reviewed overall poor prognosis and limited time. We discussed that he likely only has weeks to live vs months. We discussed that he likely will not be eating very much and that he can have anything that he enjoys. Brandon Sherman is tearful but understands. She agrees with hospice to get them home once PleurX peritoneal drain placed.   I met with Brandon Sherman, Brandon Sherman, and their grandson at bedside. They are frustrated with the lack of knowing time of procedure. Initially we were told that drain should be placed today and they should get him anytime. We were updated that there was an emergency and drain will not be placed until tomorrow. Brandon Sherman is increasingly frustrated. I offered if he wished to return home without drain this would be his choice but he does want ascites drained and will stay. Updated diet so that he can eat/drink until midnight. NPO after midnight for procedure tomorrow. I also communicated with hospice liaison, Cheri, and coordinated communication with family to ensure equipment is ordered and will be delivered for use at home.   All questions/concerns addressed to best of my ability. Emotional support provided.   Exam: Alert, oriented. Frustrated. No distress. Ill-appearing. Abd distended. Generalized weakness and fatigue.   Plan: - DNR - IR to place PleurX peritoneal drain - Hospice to follow and manage at home  50 min  Brandon Channel, NP Palliative Medicine Team Pager [phone] (Please see [email or web address] for schedule) Team Phone  [phone]    Greater than 50%  of this time was spent counseling and coordinating care related to the above assessment and plan

## 2023-09-04 NOTE — Discharge Summary (Signed)
Physician Discharge Summary  Brandon Sherman VHQ:[id number] DOB: December 22, 1945 DOA: 09/01/2023  PCP: Luis Abed, MD  Admit date: 09/01/2023 Discharge date: 09/05/2023  Time spent: 35 minutes  Recommendations for Outpatient Follow-up:  Patient discharging home with home hospice care-meds etc. 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 Mahala Menghini, discharge was delayed by one day for placement of pleurx catheter which was performed day of discharge without complication.  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 Blake Divine was not ready at that time for hospice--now is LVP up to 10 to 12 L each time dependent-declined Pleurx also                         Last LVP 8.1 L on 08/30/2023 Gout Paroxysmal A-fib status post DCCV 04/2020 on apixaban DM TY 2 CKD 3 at baseline BMI 31     Brought to emergency room 09/01/2023 severe abdominal chest pain Found to have acute pancreatitis bili 1.7 AST 48 Lipase 544 CT imaging =?  GB stone versus cholangiocarcinoma related  pancreatitis Rx saline bolus 100 cc/H----also found to have AKI creatinine up to 2.5 [facility] consulted regarding MRCP-contemplated GEN surgery consult but probably not a good candidate for overt surgery Palliative care consulted additionally and eventually after discussion amongst family palliative care patient elected on having an abdominal Pleurx drain placed and was discharged home His leukocytosis admission was secondary to SIRS not infection I do not think he had a urinary infection--- he is stabilized reliably can eat as tolerated with comfort feeds discontinued multiple other meds not consistent with end-of-life philosophy (diabetes mellitus statin blood pressure meds etc.) and can have his drain managed by hospice     Discharge Exam: Vitals:   09/05/23 1154 09/05/23 1258  BP: (!) 82/62 (!) 85/68  Pulse: 89 75  Resp: 20 18  Temp:    SpO2: 100% 99%     EOMI NCAT no focal deficit mild icterus no pallor no wheeze no rales no rhonchi Abdomen distended, non-tender No lower extremity edema  Discharge Instructions   Discharge Instructions     Diet - low sodium heart healthy   Complete by: As directed    Increase activity slowly   Complete by: As directed       Allergies as of 09/05/2023   No Known Allergies      Medication List     STOP taking these medications    allopurinol 100 MG tablet Commonly known as: ZYLOPRIM   amoxicillin-clavulanate 875-125 MG tablet Commonly known as: AUGMENTIN   atorvastatin 40 MG tablet Commonly known as: LIPITOR   colchicine 0.6 MG tablet  Eliquis 5 MG Tabs tablet Generic drug: apixaban   fenofibrate 145 MG tablet Commonly known as: TRICOR   furosemide 40 MG tablet Commonly known as: LASIX   hydrALAZINE 50 MG tablet Commonly known as: APRESOLINE   lidocaine-prilocaine cream Commonly known as: EMLA   metFORMIN 500 MG 24 hr tablet Commonly known as: GLUCOPHAGE-XR   metoprolol succinate 50 MG 24 hr  tablet Commonly known as: TOPROL-XL   nystatin 100000 UNIT/ML suspension Commonly known as: MYCOSTATIN   olmesartan 40 MG tablet Commonly known as: BENICAR   pantoprazole 40 MG tablet Commonly known as: PROTONIX       TAKE these medications    finasteride 5 MG tablet Commonly known as: PROSCAR Take 5 mg by mouth daily.   gabapentin 300 MG capsule Commonly known as: NEURONTIN Take 300 mg by mouth 2 (two) times daily.   hydrocortisone 2.5 % rectal cream Commonly known as: ANUSOL-HC Apply 1 Application topically See admin instructions. 2-4 times a day.   ondansetron 8 MG tablet Commonly known as: ZOFRAN Take 1 tablet (8 mg total) by mouth every 8 (eight) hours as needed for nausea or vomiting.   tamsulosin 0.4 MG Caps capsule Commonly known as: FLOMAX Take 0.4 mg by mouth daily.               Durable Medical Equipment  (From admission, onward)           Start     Ordered   09/03/23 1730  For home use only DME 4 wheeled rolling walker with seat  Once       Question:  Patient needs a walker to treat with the following condition  Answer:  Bilateral pleural effusion   09/03/23 1729           No Known Allergies  Follow-up Information     [facility] Follow up.   Specialty: PALLIATIVE CARE Why: [facility] will provide you with home hospice services. Contact information: [address] Rondall [place] [phone]        Malachy Mood, MD Follow up.   Specialties: Hematology, Oncology Contact information: [address] [place] [address] [phone]         Gilmer Mor, DO Follow up.   Specialties: Interventional Radiology, Radiology Why: this is the doctor you can contact if there are issues with the drain Contact information: [address] [place] [address] [phone]                  The results of significant diagnostics from this hospitalization  (including imaging, microbiology, ancillary and laboratory) are listed below for reference.    Significant Diagnostic Studies: CT ABDOMEN PELVIS WO CONTRAST  Result Date: 09/01/2023 CLINICAL DATA:  Epigastric pain, recent paracentesis. History of liver cancer. Severe chest and abdominal pain. EXAM: CT ABDOMEN AND PELVIS WITHOUT CONTRAST TECHNIQUE: Multidetector CT imaging of the abdomen and pelvis was performed following the standard protocol without IV contrast. RADIATION DOSE REDUCTION: This exam was performed according to the departmental dose-optimization program which includes automated exposure control, adjustment of the mA and/or kV according to patient size and/or use of iterative reconstruction technique. COMPARISON:  04/08/2020, 11/21/2022. FINDINGS: Lower chest: The heart is normal in size and coronary artery calcifications are noted. The distal tip of a central venous catheter terminates in the right atrium. Scattered pulmonary nodules are noted at the lung bases, the largest measuring 2.2 cm in the right lower  lobe, axial image 15. Hepatobiliary: Multiple hypodense nodules and masses are present in the liver measuring up to 5.2 cm in the anterior right lobe of the liver. The right lobe of the liver is atrophied with a nodular contour. Stones are present within the gallbladder. No biliary ductal dilatation. Pancreas: Unremarkable. No pancreatic ductal dilatation or surrounding inflammatory changes. Spleen: Normal in size without focal abnormality. Adrenals/Urinary Tract: The adrenal glands are within normal limits. No renal calculus or hydronephrosis. The bladder is unremarkable. Stomach/Bowel: Stomach is within normal limits. Appendix is not seen. No evidence of bowel wall thickening, distention, or inflammatory changes. No free air or pneumatosis. Vascular/Lymphatic: Aortic atherosclerosis. No enlarged abdominal or pelvic lymph nodes. Reproductive: Prostate gland is mildly enlarged. Other: Large  ascites is noted. Musculoskeletal: Degenerative changes are present in the thoracolumbar spine. No acute osseous abnormality. IMPRESSION: 1. No acute intra-abdominal process. 2. Morphologic changes of cirrhosis with multiple hypodense masses in the liver measuring up to 5.2 cm, bowel or evaluated on prior MRI. 3. Scattered pulmonary nodules at the lung bases measuring up to 2.2 cm, increased from 1.4 cm on the previous exam and concerning for metastatic disease. 4. Large ascites. 5. Aortic atherosclerosis. Electronically Signed   By: Thornell Sartorius M.D.   On: 09/01/2023 20:19   DG Chest 2 View  Result Date: 09/01/2023 CLINICAL DATA:  chest pain liver cancer EXAM: CHEST - 2 VIEW COMPARISON:  CT Chest 11/21/22 FINDINGS: Right-sided chest port in place. No pleural effusion. No pneumothorax. Unchanged cardiac and mediastinal contours. No focal airspace opacity. No radiographically apparent acute displaced rib fractures. Visualized upper abdomen is unremarkable. Vertebral body heights are maintained. IMPRESSION: No focal airspace opacity.  No pleural effusion.  No pneumothorax. Electronically Signed   By: Lorenza Cambridge M.D.   On: 09/01/2023 17:55   IR Paracentesis  Result Date: 08/30/2023 INDICATION: History of cholangiocarcinoma with recurrent ascites. Request received for therapeutic paracentesis EXAM: ULTRASOUND GUIDED  PARACENTESIS MEDICATIONS: 9 mL 1% lidocaine COMPLICATIONS: None immediate. PROCEDURE: Informed written consent was obtained from the patient after a discussion of the risks, benefits and alternatives to treatment. A timeout was performed prior to the initiation of the procedure. Initial ultrasound scanning demonstrates a large amount of ascites within the right lower abdominal quadrant. The right lower abdomen was prepped and draped in the usual sterile fashion. 1% lidocaine was used for local anesthesia. Following this, a 19 gauge, 7-cm, Yueh catheter was introduced. An ultrasound image was  saved for documentation purposes. The paracentesis was performed. The catheter was removed and a dressing was applied. The patient tolerated the procedure well without immediate post procedural complication. Patient received post-procedure intravenous albumin; see nursing notes for details. FINDINGS: A total of approximately 8.1 L of clear yellow fluid was removed. Samples were sent to the laboratory as requested by the clinical team. IMPRESSION: Successful ultrasound-guided paracentesis yielding 8.1 liters of peritoneal fluid. Procedure performed by Mina Marble, PA-C Electronically Signed   By: Marliss Coots M.D.   On: 08/30/2023 15:55   IR Paracentesis  Result Date: 08/23/2023 INDICATION: History of cholangiocarcinoma with recurrent ascites. Request for paracentesis. EXAM: ULTRASOUND GUIDED PARACENTESIS MEDICATIONS: 1% lidocaine 8 mL COMPLICATIONS: None immediate. PROCEDURE: Informed written consent was obtained from the patient after a discussion of the risks, benefits and alternatives to treatment. A timeout was performed prior to the initiation of the procedure. Initial ultrasound scanning demonstrates a large amount of ascites within the right lower abdominal quadrant. The right lower abdomen was prepped and  draped in the usual sterile fashion. 1% lidocaine was used for local anesthesia. Following this, a 19 gauge, 7-cm, Yueh catheter was introduced. An ultrasound image was saved for documentation purposes. The paracentesis was performed. The catheter was removed and a dressing was applied. The patient tolerated the procedure well without immediate post procedural complication. Patient received post-procedure intravenous albumin; see nursing notes for details. FINDINGS: A total of approximately 10.1 L of clear yellow fluid was removed. IMPRESSION: Successful ultrasound-guided paracentesis yielding 10.1 liters of peritoneal fluid. Procedure performed by: Corrin Parker, PA-C Electronically Signed   By: Roanna Banning M.D.   On: 08/23/2023 14:52   IR Paracentesis  Result Date: 08/16/2023 INDICATION: History of cholangiocarcinoma. Recurrent symptomatic ascites. Please perform ultrasound-guided paracentesis for palliative purposes. EXAM: ULTRASOUND-GUIDED PARACENTESIS COMPARISON:  Multiple previous ultrasound-guided paracenteses, most recently on 08/09/2023 (yielding 10.3 L of peritoneal fluid). MEDICATIONS: None. COMPLICATIONS: None immediate. TECHNIQUE: Informed written consent was obtained from the patient after a discussion of the risks, benefits and alternatives to treatment. A timeout was performed prior to the initiation of the procedure. Initial ultrasound scanning demonstrates a large amount of ascites within the right lower abdomen which was subsequently prepped and draped in the usual sterile fashion. 1% lidocaine with epinephrine was used for local anesthesia. Under direct ultrasound guidance, a 19 gauge, 7-cm, Yueh catheter was introduced. An ultrasound image was saved for documentation purposed. The paracentesis was performed. The catheter was removed and a dressing was applied. The patient tolerated the procedure well without immediate post procedural complication. FINDINGS: A total of approximately 9.6 liters of serous fluid was removed. IMPRESSION: Successful ultrasound-guided paracentesis yielding 9.6 liters of peritoneal fluid. Electronically Signed   By: Simonne Come M.D.   On: 08/16/2023 13:17   IR Paracentesis  Result Date: 08/09/2023 INDICATION: 77 year old male with recurrent ascites. Request for therapeutic paracentesis. EXAM: ULTRASOUND GUIDED THERAPEUTIC PARACENTESIS MEDICATIONS: 10 mL 1% lidocaine COMPLICATIONS: None immediate. PROCEDURE: Informed written consent was obtained from the patient after a discussion of the risks, benefits and alternatives to treatment. A timeout was performed prior to the initiation of the procedure. Initial ultrasound scanning demonstrates a large amount of  ascites within the right lower abdominal quadrant. The right lower abdomen was prepped and draped in the usual sterile fashion. 1% lidocaine was used for local anesthesia. Following this, a 19 gauge, 7-cm, Yueh catheter was introduced. An ultrasound image was saved for documentation purposes. The paracentesis was performed. The catheter was removed and a dressing was applied. The patient tolerated the procedure well without immediate post procedural complication. Patient received post-procedure intravenous albumin; see nursing notes for details. FINDINGS: A total of approximately 10.3 liters of yellow fluid was removed. IMPRESSION: Successful ultrasound-guided paracentesis yielding 10.3 liters of peritoneal fluid. Performed by: Loyce Dys PA-C Electronically Signed   By: Irish Lack M.D.   On: 08/09/2023 14:58    Microbiology: No results found for this or any previous visit (from the past 240 hour(s)).   Labs: Basic Metabolic Panel: Recent Labs  Lab 09/01/23 1635 09/02/23 0442 09/03/23 0815 09/04/23 0512 09/05/23 0442  NA 131* 130* 131* 127* 128*  K 4.8 4.4 4.7 4.1 4.3  CL 98 101 107 99 98  CO2 19* 15* 14* 15* 16*  GLUCOSE 135* 89 87 88 105*  BUN 42* 42* 51* 60* 71*  CREATININE 2.51* 2.18* 2.32* 2.26* 2.36*  CALCIUM 10.5* 9.8 9.3 9.2 9.7  MG  --  2.1  --   --   --  PHOS  --  4.0  --   --   --    Liver Function Tests: Recent Labs  Lab 09/01/23 1635 09/02/23 0442  AST 48* 41  ALT 35 30  ALKPHOS 50 43  BILITOT 1.7* 1.7*  PROT 7.2 5.9*  ALBUMIN 4.2 3.4*   Recent Labs  Lab 09/01/23 1635  LIPASE 544*   No results for input(s): "AMMONIA" in the last 168 hours. CBC: Recent Labs  Lab 09/01/23 1635 09/02/23 0442 09/03/23 0815 09/04/23 0512 09/05/23 0442  WBC 18.0* 17.9* 22.5* 15.3* 13.3*  NEUTROABS  --   --  20.7* 13.7* 11.7*  HGB 14.9 13.2 13.3 12.6* 12.6*  HCT 43.3 37.6* 39.2 36.6* 36.2*  MCV 91.7 89.7 93.3 92.2 90.7  PLT 208 116* 120* 115* 135*   Cardiac  Enzymes: No results for input(s): "CKTOTAL", "CKMB", "CKMBINDEX", "TROPONINI" in the last 168 hours. BNP: BNP (last 3 results) No results for input(s): "BNP" in the last 8760 hours.  ProBNP (last 3 results) No results for input(s): "PROBNP" in the last 8760 hours.  CBG: Recent Labs  Lab 09/04/23 0743 09/04/23 1151 09/04/23 1647 09/05/23 0724 09/05/23 1254  GLUCAP 85 106* 100* 99 120*       Signed:  Silvano Bilis MD   [facility] 09/05/2023, 1:29 PM

## 2023-09-04 NOTE — Consult Note (Signed)
Chief Complaint: Patient was seen in consultation today for peritoneal PleurX catheter placement Chief Complaint  Patient presents with   Hepatic Cancer   Chest Pain   at the request of A Parker NP  Referring Physician(s): Dr Mahala Menghini  Supervising Physician: Marliss Coots  Patient Status: [facility] - In-pt  History of Present Illness: Brandon Sherman is a 77 y.o. male   DNR Code status per pt Metastatic cholangiocarcinoma A Fib- LD Eliquis 10/12 CKD 3; HTN; DM Pancreatitis vs gallstones Progressive cancer per imaging Many paracentesis--- recurrent ascites Malignant ascites  Plans for [name] with Palliative Request made for peritoneal PleurX catheter placement  Approved with IR Rad Planned for today in IR   Past Medical History:  Diagnosis Date   Arthritis    Diabetes (HCC)    GERD (gastroesophageal reflux disease)    Hyperlipidemia    Hypertension    Intrahepatic cholangiocarcinoma (HCC)     Past Surgical History:  Procedure Laterality Date   APPENDECTOMY  1980   CARDIOVERSION N/A 04/27/2020   Procedure: CARDIOVERSION;  Surgeon: Lars Masson, MD;  Location: [facility] ENDOSCOPY;  Service: Cardiovascular;  Laterality: N/A;   CARDIOVERSION N/A 05/19/2020   Procedure: CARDIOVERSION;  Surgeon: Parke Poisson, MD;  Location: [facility] ENDOSCOPY;  Service: Cardiovascular;  Laterality: N/A;   COLONOSCOPY     IR 3D INDEPENDENT WKST  08/11/2020   IR 3D INDEPENDENT WKST  08/11/2020   IR ANGIOGRAM SELECTIVE EACH ADDITIONAL VESSEL  08/11/2020   IR ANGIOGRAM SELECTIVE EACH ADDITIONAL VESSEL  08/11/2020   IR ANGIOGRAM SELECTIVE EACH ADDITIONAL VESSEL  08/11/2020   IR ANGIOGRAM SELECTIVE EACH ADDITIONAL VESSEL  08/11/2020   IR ANGIOGRAM SELECTIVE EACH ADDITIONAL VESSEL  08/11/2020   IR ANGIOGRAM SELECTIVE EACH ADDITIONAL VESSEL  08/11/2020   IR ANGIOGRAM SELECTIVE EACH ADDITIONAL VESSEL  08/26/2020   IR ANGIOGRAM SELECTIVE EACH ADDITIONAL VESSEL  08/26/2020   IR  ANGIOGRAM VISCERAL SELECTIVE  08/11/2020   IR ANGIOGRAM VISCERAL SELECTIVE  08/26/2020   IR EMBO ARTERIAL NOT HEMORR HEMANG INC GUIDE ROADMAPPING  08/11/2020   IR EMBO TUMOR ORGAN ISCHEMIA INFARCT INC GUIDE ROADMAPPING  08/26/2020   IR IMAGING GUIDED PORT INSERTION  02/23/2020   IR PARACENTESIS  01/05/2023   IR PARACENTESIS  01/18/2023   IR PARACENTESIS  01/25/2023   IR PARACENTESIS  02/01/2023   IR PARACENTESIS  02/08/2023   IR PARACENTESIS  02/15/2023   IR PARACENTESIS  02/23/2023   IR PARACENTESIS  03/01/2023   IR PARACENTESIS  03/08/2023   IR PARACENTESIS  03/15/2023   IR PARACENTESIS  03/22/2023   IR PARACENTESIS  03/29/2023   IR PARACENTESIS  04/05/2023   IR PARACENTESIS  04/12/2023   IR PARACENTESIS  04/19/2023   IR PARACENTESIS  04/27/2023   IR PARACENTESIS  05/03/2023   IR PARACENTESIS  05/10/2023   IR PARACENTESIS  05/17/2023   IR PARACENTESIS  05/25/2023   IR PARACENTESIS  05/31/2023   IR PARACENTESIS  06/07/2023   IR PARACENTESIS  06/14/2023   IR PARACENTESIS  06/21/2023   IR PARACENTESIS  06/28/2023   IR PARACENTESIS  07/05/2023   IR PARACENTESIS  07/12/2023   IR PARACENTESIS  07/19/2023   IR PARACENTESIS  07/26/2023   IR PARACENTESIS  08/02/2023   IR PARACENTESIS  08/09/2023   IR PARACENTESIS  08/16/2023   IR PARACENTESIS  08/23/2023   IR PARACENTESIS  08/30/2023   IR RADIOLOGIST EVAL & MGMT  07/14/2020   IR RADIOLOGIST EVAL & MGMT  09/30/2020  IR RADIOLOGIST EVAL & MGMT  12/01/2020   IR US GUIDE VASC ACCESS LEFT  08/26/2020   IR US GUIDE VASC ACCESS RIGHT  08/11/2020    Allergies: Patient has no known allergies.  Medications: Prior to Admission medications   Medication Sig Start Date End Date Taking? Authorizing Provider  allopurinol (ZYLOPRIM) 100 MG tablet Take 100 mg by mouth daily.   Yes [provider]  amoxicillin-clavulanate (AUGMENTIN) 875-125 MG tablet Take 1 tablet by mouth 2 (two) times daily. 08/27/23  Yes [provider]  apixaban (ELIQUIS) 5 MG TABS tablet TAKE 1  TABLET TWICE DAILY 07/27/23  Yes Runell Gess, MD  atorvastatin (LIPITOR) 40 MG tablet Take 1 tablet (40 mg total) by mouth daily. Please call [phone] to schedule a January appointment for future refills. Thank you. 07/24/23  Yes Runell Gess, MD  colchicine 0.6 MG tablet Take 0.6 mg by mouth daily as needed (for gout flare up).   Yes [provider]  fenofibrate (TRICOR) 145 MG tablet Take 145 mg by mouth daily.   Yes [provider]  finasteride (PROSCAR) 5 MG tablet Take 5 mg by mouth daily.   Yes [provider]  furosemide (LASIX) 40 MG tablet TAKE 2 TABLETS BY MOUTH TWICE A DAY 08/27/23  Yes Runell Gess, MD  gabapentin (NEURONTIN) 300 MG capsule Take 300 mg by mouth 2 (two) times daily.   Yes [provider]  hydrALAZINE (APRESOLINE) 50 MG tablet TAKE 1.5 TABLETS (75 MG TOTAL) BY MOUTH 3 (THREE) TIMES DAILY. 06/25/23  Yes Runell Gess, MD  hydrocortisone (ANUSOL-HC) 2.5 % rectal cream Apply 1 Application topically See admin instructions. 2-4 times a day. 07/24/23  Yes [provider]  lidocaine-prilocaine (EMLA) cream Apply 1 Application topically as needed. 03/21/23  Yes Malachy Mood, MD  metFORMIN (GLUCOPHAGE-XR) 500 MG 24 hr tablet Take 250 mg by mouth daily with breakfast. 07/14/21  Yes [provider]  metoprolol succinate (TOPROL-XL) 50 MG 24 hr tablet TAKE 1 TABLET BY MOUTH EVERY DAY WITH OR IMMEDIATELY FOLLOWING A MEAL 08/27/23  Yes Runell Gess, MD  nystatin (MYCOSTATIN) 100000 UNIT/ML suspension Use as directed 5 mLs in the mouth or throat 4 (four) times daily. 08/27/23  Yes [provider]  olmesartan (BENICAR) 40 MG tablet TAKE 1 TABLET (40 MG TOTAL) BY MOUTH DAILY. 07/10/23  Yes Runell Gess, MD  ondansetron (ZOFRAN) 8 MG tablet Take 1 tablet (8 mg total) by mouth every 8 (eight) hours as needed for nausea or vomiting. 09/11/22  Yes Malachy Mood, MD  pantoprazole (PROTONIX) 40 MG tablet Take 1 tablet (40  mg total) by mouth 2 (two) times daily. 06/18/23  Yes Malachy Mood, MD  tamsulosin (FLOMAX) 0.4 MG CAPS capsule Take 0.4 mg by mouth daily.   Yes [provider]     Family History  Problem Relation Age of Onset   Heart attack Mother    Stroke Father    Colon cancer Neg Hx    Esophageal cancer Neg Hx    Stomach cancer Neg Hx    Rectal cancer Neg Hx    Colon polyps Neg Hx     Social History   Socioeconomic History   Marital status: Married    Spouse name: Not on file   Number of children: 3   Years of education: Not on file   Highest education level: Not on file  Occupational History   Not on file  Tobacco Use  Smoking status: Never   Smokeless tobacco: Current    Types: Chew   Tobacco comments:    for 60 years, 1-2 daily   Vaping Use   Vaping status: Never Used  Substance and Sexual Activity   Alcohol use: Not Currently    Comment: occasionally   Drug use: Never   Sexual activity: Not on file  Other Topics Concern   Not on file  Social History Narrative   Not on file   Social Determinants of Health   Financial Resource Strain: Not on file  Food Insecurity: No Food Insecurity (09/02/2023)   Hunger Vital Sign    Worried About Running Out of Food in the Last Year: Never true    Ran Out of Food in the Last Year: Never true  Transportation Needs: No Transportation Needs (09/02/2023)   PRAPARE - Administrator, Civil Service (Medical): No    Lack of Transportation (Non-Medical): No  Physical Activity: Insufficiently Active (05/21/2020)   Exercise Vital Sign    Days of Exercise per Week: 1 day    Minutes of Exercise per Session: 50 min  Stress: Not on file  Social Connections: Unknown (05/21/2020)   Social Connection and Isolation Panel [NHANES]    Frequency of Communication with Friends and Family: More than three times a week    Frequency of Social Gatherings with Friends and Family: More than three times a week    Attends Religious Services: Not  on Marketing executive or Organizations: Not on file    Attends Banker Meetings: Not on file    Marital Status: Married    Review of Systems: A 12 point ROS discussed and pertinent positives are indicated in the HPI above.  All other systems are negative.  Vital Signs: BP 106/70 (BP Location: Left Arm)   Pulse 71   Temp 98.7 F (37.1 C) (Oral)   Resp 17   Ht 5\' 7"  (1.702 m)   Wt 162 lb 14.7 oz (73.9 kg)   SpO2 98%   BMI 25.52 kg/m   Advance Care Plan: The advanced care plan/surrogate decision maker was discussed at the time of visit and documented in the medical record.    Physical Exam Vitals reviewed.  Cardiovascular:     Rate and Rhythm: Normal rate. Rhythm irregular.  Pulmonary:     Effort: Pulmonary effort is normal.     Breath sounds: No wheezing.  Abdominal:     Palpations: Abdomen is soft.  Musculoskeletal:        General: Normal range of motion.  Skin:    General: Skin is warm and dry.  Neurological:     Mental Status: He is alert and oriented to person, place, and time.  Psychiatric:        Behavior: Behavior normal.     Imaging: CT ABDOMEN PELVIS WO CONTRAST  Result Date: 09/01/2023 CLINICAL DATA:  Epigastric pain, recent paracentesis. History of liver cancer. Severe chest and abdominal pain. EXAM: CT ABDOMEN AND PELVIS WITHOUT CONTRAST TECHNIQUE: Multidetector CT imaging of the abdomen and pelvis was performed following the standard protocol without IV contrast. RADIATION DOSE REDUCTION: This exam was performed according to the departmental dose-optimization program which includes automated exposure control, adjustment of the mA and/or kV according to patient size and/or use of iterative reconstruction technique. COMPARISON:  04/08/2020, 11/21/2022. FINDINGS: Lower chest: The heart is normal in size and coronary artery calcifications are noted. The distal tip of  a central venous catheter terminates in the right atrium. Scattered  pulmonary nodules are noted at the lung bases, the largest measuring 2.2 cm in the right lower lobe, axial image 15. Hepatobiliary: Multiple hypodense nodules and masses are present in the liver measuring up to 5.2 cm in the anterior right lobe of the liver. The right lobe of the liver is atrophied with a nodular contour. Stones are present within the gallbladder. No biliary ductal dilatation. Pancreas: Unremarkable. No pancreatic ductal dilatation or surrounding inflammatory changes. Spleen: Normal in size without focal abnormality. Adrenals/Urinary Tract: The adrenal glands are within normal limits. No renal calculus or hydronephrosis. The bladder is unremarkable. Stomach/Bowel: Stomach is within normal limits. Appendix is not seen. No evidence of bowel wall thickening, distention, or inflammatory changes. No free air or pneumatosis. Vascular/Lymphatic: Aortic atherosclerosis. No enlarged abdominal or pelvic lymph nodes. Reproductive: Prostate gland is mildly enlarged. Other: Large ascites is noted. Musculoskeletal: Degenerative changes are present in the thoracolumbar spine. No acute osseous abnormality. IMPRESSION: 1. No acute intra-abdominal process. 2. Morphologic changes of cirrhosis with multiple hypodense masses in the liver measuring up to 5.2 cm, bowel or evaluated on prior MRI. 3. Scattered pulmonary nodules at the lung bases measuring up to 2.2 cm, increased from 1.4 cm on the previous exam and concerning for metastatic disease. 4. Large ascites. 5. Aortic atherosclerosis. Electronically Signed   By: Thornell Sartorius M.D.   On: 09/01/2023 20:19   DG Chest 2 View  Result Date: 09/01/2023 CLINICAL DATA:  chest pain liver cancer EXAM: CHEST - 2 VIEW COMPARISON:  CT Chest 11/21/22 FINDINGS: Right-sided chest port in place. No pleural effusion. No pneumothorax. Unchanged cardiac and mediastinal contours. No focal airspace opacity. No radiographically apparent acute displaced rib fractures. Visualized upper  abdomen is unremarkable. Vertebral body heights are maintained. IMPRESSION: No focal airspace opacity.  No pleural effusion.  No pneumothorax. Electronically Signed   By: Lorenza Cambridge M.D.   On: 09/01/2023 17:55   IR Paracentesis  Result Date: 08/30/2023 INDICATION: History of cholangiocarcinoma with recurrent ascites. Request received for therapeutic paracentesis EXAM: ULTRASOUND GUIDED  PARACENTESIS MEDICATIONS: 9 mL 1% lidocaine COMPLICATIONS: None immediate. PROCEDURE: Informed written consent was obtained from the patient after a discussion of the risks, benefits and alternatives to treatment. A timeout was performed prior to the initiation of the procedure. Initial ultrasound scanning demonstrates a large amount of ascites within the right lower abdominal quadrant. The right lower abdomen was prepped and draped in the usual sterile fashion. 1% lidocaine was used for local anesthesia. Following this, a 19 gauge, 7-cm, Yueh catheter was introduced. An ultrasound image was saved for documentation purposes. The paracentesis was performed. The catheter was removed and a dressing was applied. The patient tolerated the procedure well without immediate post procedural complication. Patient received post-procedure intravenous albumin; see nursing notes for details. FINDINGS: A total of approximately 8.1 L of clear yellow fluid was removed. Samples were sent to the laboratory as requested by the clinical team. IMPRESSION: Successful ultrasound-guided paracentesis yielding 8.1 liters of peritoneal fluid. Procedure performed by Mina Marble, PA-C Electronically Signed   By: Marliss Coots M.D.   On: 08/30/2023 15:55   IR Paracentesis  Result Date: 08/23/2023 INDICATION: History of cholangiocarcinoma with recurrent ascites. Request for paracentesis. EXAM: ULTRASOUND GUIDED PARACENTESIS MEDICATIONS: 1% lidocaine 8 mL COMPLICATIONS: None immediate. PROCEDURE: Informed written consent was obtained from the patient  after a discussion of the risks, benefits and alternatives to treatment. A timeout was performed prior  to the initiation of the procedure. Initial ultrasound scanning demonstrates a large amount of ascites within the right lower abdominal quadrant. The right lower abdomen was prepped and draped in the usual sterile fashion. 1% lidocaine was used for local anesthesia. Following this, a 19 gauge, 7-cm, Yueh catheter was introduced. An ultrasound image was saved for documentation purposes. The paracentesis was performed. The catheter was removed and a dressing was applied. The patient tolerated the procedure well without immediate post procedural complication. Patient received post-procedure intravenous albumin; see nursing notes for details. FINDINGS: A total of approximately 10.1 L of clear yellow fluid was removed. IMPRESSION: Successful ultrasound-guided paracentesis yielding 10.1 liters of peritoneal fluid. Procedure performed by: Corrin Parker, PA-C Electronically Signed   By: Roanna Banning M.D.   On: 08/23/2023 14:52   IR Paracentesis  Result Date: 08/16/2023 INDICATION: History of cholangiocarcinoma. Recurrent symptomatic ascites. Please perform ultrasound-guided paracentesis for palliative purposes. EXAM: ULTRASOUND-GUIDED PARACENTESIS COMPARISON:  Multiple previous ultrasound-guided paracenteses, most recently on 08/09/2023 (yielding 10.3 L of peritoneal fluid). MEDICATIONS: None. COMPLICATIONS: None immediate. TECHNIQUE: Informed written consent was obtained from the patient after a discussion of the risks, benefits and alternatives to treatment. A timeout was performed prior to the initiation of the procedure. Initial ultrasound scanning demonstrates a large amount of ascites within the right lower abdomen which was subsequently prepped and draped in the usual sterile fashion. 1% lidocaine with epinephrine was used for local anesthesia. Under direct ultrasound guidance, a 19 gauge, 7-cm, Yueh catheter was  introduced. An ultrasound image was saved for documentation purposed. The paracentesis was performed. The catheter was removed and a dressing was applied. The patient tolerated the procedure well without immediate post procedural complication. FINDINGS: A total of approximately 9.6 liters of serous fluid was removed. IMPRESSION: Successful ultrasound-guided paracentesis yielding 9.6 liters of peritoneal fluid. Electronically Signed   By: Simonne Come M.D.   On: 08/16/2023 13:17   IR Paracentesis  Result Date: 08/09/2023 INDICATION: 77 year old male with recurrent ascites. Request for therapeutic paracentesis. EXAM: ULTRASOUND GUIDED THERAPEUTIC PARACENTESIS MEDICATIONS: 10 mL 1% lidocaine COMPLICATIONS: None immediate. PROCEDURE: Informed written consent was obtained from the patient after a discussion of the risks, benefits and alternatives to treatment. A timeout was performed prior to the initiation of the procedure. Initial ultrasound scanning demonstrates a large amount of ascites within the right lower abdominal quadrant. The right lower abdomen was prepped and draped in the usual sterile fashion. 1% lidocaine was used for local anesthesia. Following this, a 19 gauge, 7-cm, Yueh catheter was introduced. An ultrasound image was saved for documentation purposes. The paracentesis was performed. The catheter was removed and a dressing was applied. The patient tolerated the procedure well without immediate post procedural complication. Patient received post-procedure intravenous albumin; see nursing notes for details. FINDINGS: A total of approximately 10.3 liters of yellow fluid was removed. IMPRESSION: Successful ultrasound-guided paracentesis yielding 10.3 liters of peritoneal fluid. Performed by: Loyce Dys PA-C Electronically Signed   By: Irish Lack M.D.   On: 08/09/2023 14:58    Labs:  CBC: Recent Labs    09/01/23 1635 09/02/23 0442 09/03/23 0815 09/04/23 0512  WBC 18.0* 17.9* 22.5*  15.3*  HGB 14.9 13.2 13.3 12.6*  HCT 43.3 37.6* 39.2 36.6*  PLT 208 116* 120* 115*    COAGS: No results for input(s): "INR", "APTT" in the last 8760 hours.  BMP: Recent Labs    09/01/23 1635 09/02/23 0442 09/03/23 0815 09/04/23 0512  NA 131* 130* 131* 127*  K 4.8 4.4 4.7 4.1  CL 98 101 107 99  CO2 19* 15* 14* 15*  GLUCOSE 135* 89 87 88  BUN 42* 42* 51* 60*  CALCIUM 10.5* 9.8 9.3 9.2  CREATININE 2.51* 2.18* 2.32* 2.26*  GFRNONAA 26* 31* 29* 30*    LIVER FUNCTION TESTS: Recent Labs    05/02/23 0946 06/18/23 0813 09/01/23 1635 09/02/23 0442  BILITOT 1.1 0.9 1.7* 1.7*  AST 44* 39 48* 41  ALT 28 28 35 30  ALKPHOS 43 44 50 43  PROT 6.4* 6.0* 7.2 5.9*  ALBUMIN 3.5 3.7 4.2 3.4*    TUMOR MARKERS: No results for input(s): "AFPTM", "CEA", "CA199", "CHROMGRNA" in the last 8760 hours.  Assessment and Plan:  Scheduled for peritoneal PleurX catheter placement  Recurrent malignant ascites Follows with Hospice/Palliative care Risks and benefits discussed with the patient including bleeding, infection, damage to adjacent structures and sepsis.  All of the patient's questions were answered, patient is agreeable to proceed. Consent signed and in chart.  Thank you for this interesting consult.  I greatly enjoyed meeting Brandon Sherman and look forward to participating in their care.  A copy of this report was sent to the requesting provider on this date.  Electronically Signed: Robet Leu, PA-C 09/04/2023, 6:46 AM   I spent a total of 20 Minutes    in face to face in clinical consultation, greater than 50% of which was counseling/coordinating care for peritoneal pleurx catheter placement

## 2023-09-05 ENCOUNTER — Inpatient Hospital Stay (HOSPITAL_COMMUNITY): Payer: Medicare HMO

## 2023-09-05 DIAGNOSIS — K851 Biliary acute pancreatitis without necrosis or infection: Secondary | ICD-10-CM | POA: Diagnosis not present

## 2023-09-05 HISTORY — PX: IR IMAGE GUIDED DRAINAGE PERCUT CATH  PERITONEAL RETROPERIT: IMG5467

## 2023-09-05 LAB — CBC WITH DIFFERENTIAL/PLATELET
Abs Immature Granulocytes: 0.07 10*3/uL (ref 0.00–0.07)
Basophils Absolute: 0 10*3/uL (ref 0.0–0.1)
Basophils Relative: 0 %
Eosinophils Absolute: 0.1 10*3/uL (ref 0.0–0.5)
Eosinophils Relative: 1 %
HCT: 36.2 % — ABNORMAL LOW (ref 39.0–52.0)
Hemoglobin: 12.6 g/dL — ABNORMAL LOW (ref 13.0–17.0)
Immature Granulocytes: 1 %
Lymphocytes Relative: 6 %
Lymphs Abs: 0.7 10*3/uL (ref 0.7–4.0)
MCH: 31.6 pg (ref 26.0–34.0)
MCHC: 34.8 g/dL (ref 30.0–36.0)
MCV: 90.7 fL (ref 80.0–100.0)
Monocytes Absolute: 0.7 10*3/uL (ref 0.1–1.0)
Monocytes Relative: 5 %
Neutro Abs: 11.7 10*3/uL — ABNORMAL HIGH (ref 1.7–7.7)
Neutrophils Relative %: 87 %
Platelets: 135 10*3/uL — ABNORMAL LOW (ref 150–400)
RBC: 3.99 MIL/uL — ABNORMAL LOW (ref 4.22–5.81)
RDW: 15.6 % — ABNORMAL HIGH (ref 11.5–15.5)
WBC: 13.3 10*3/uL — ABNORMAL HIGH (ref 4.0–10.5)
nRBC: 0 % (ref 0.0–0.2)

## 2023-09-05 LAB — BASIC METABOLIC PANEL
Anion gap: 14 (ref 5–15)
BUN: 71 mg/dL — ABNORMAL HIGH (ref 8–23)
CO2: 16 mmol/L — ABNORMAL LOW (ref 22–32)
Calcium: 9.7 mg/dL (ref 8.9–10.3)
Chloride: 98 mmol/L (ref 98–111)
Creatinine, Ser: 2.36 mg/dL — ABNORMAL HIGH (ref 0.61–1.24)
GFR, Estimated: 28 mL/min — ABNORMAL LOW (ref >60)
Glucose, Bld: 105 mg/dL — ABNORMAL HIGH (ref 70–99)
Potassium: 4.3 mmol/L (ref 3.5–5.1)
Sodium: 128 mmol/L — ABNORMAL LOW (ref 135–145)

## 2023-09-05 LAB — GLUCOSE, CAPILLARY
Glucose-Capillary: 99 mg/dL (ref 70–99)
Glucose-Capillary: 120 mg/dL — ABNORMAL HIGH (ref 70–99)
Glucose-Capillary: 105 mg/dL — ABNORMAL HIGH (ref 70–99)

## 2023-09-05 MED ORDER — CEFAZOLIN SODIUM-DEXTROSE 2-4 GM/100ML-% IV SOLN
INTRAVENOUS | Status: AC
  Filled 2023-09-05: qty 100

## 2023-09-05 MED ORDER — MIDAZOLAM HCL 2 MG/2ML IJ SOLN
INTRAMUSCULAR | Status: AC
  Filled 2023-09-05: qty 4

## 2023-09-05 MED ORDER — FENTANYL CITRATE (PF) 100 MCG/2ML IJ SOLN
INTRAMUSCULAR | Status: AC | PRN
Start: 2023-09-05 — End: 2023-09-05
  Administered 2023-09-05 (×2): 25 ug via INTRAVENOUS

## 2023-09-05 MED ORDER — FENTANYL CITRATE (PF) 100 MCG/2ML IJ SOLN
INTRAMUSCULAR | Status: AC
  Filled 2023-09-05: qty 4

## 2023-09-05 MED ORDER — MIDAZOLAM HCL 2 MG/2ML IJ SOLN
INTRAMUSCULAR | Status: AC | PRN
Start: 2023-09-05 — End: 2023-09-05
  Administered 2023-09-05: .5 mg via INTRAVENOUS

## 2023-09-05 NOTE — Sedation Documentation (Addendum)
6.1 Liters of fluid removed  RN advised that pt. physician order for supplies list for [employer] supplies for At Home Use is required & paperwork is in chart

## 2023-09-05 NOTE — Procedures (Signed)
Interventional Radiology Procedure Note  Procedure: Image guided tunneled peritoneal catheter  Complications: None  EBL: None    Recommendations: - Routine drain care,  - ok to advance diet per primary order  - routine wound care  Signed,  Yvone Neu. Loreta Ave, DO, ABVM, RPVI

## 2023-09-05 NOTE — Plan of Care (Signed)

## 2023-09-13 ENCOUNTER — Encounter (HOSPITAL_COMMUNITY): Payer: Self-pay

## 2023-09-21 DEATH — deceased

## 2023-10-01 ENCOUNTER — Encounter: Payer: Self-pay | Admitting: Hematology

## 2023-10-08 ENCOUNTER — Other Ambulatory Visit: Payer: Medicare HMO

## 2023-10-08 ENCOUNTER — Ambulatory Visit: Payer: Medicare HMO | Admitting: Hematology

## 2023-10-10 NOTE — Telephone Encounter (Signed)
Telephone call  

## 2024-04-14 NOTE — Telephone Encounter (Signed)
Patient Deceased
